# Patient Record
Sex: Male | Born: 1950
Health system: Southern US, Community
[De-identification: ages and names within clinical notes are randomized; demographics above are authoritative.]

## PROBLEM LIST (undated history)

## (undated) DIAGNOSIS — D509 Iron deficiency anemia, unspecified: Secondary | ICD-10-CM

## (undated) DIAGNOSIS — M4800 Spinal stenosis, site unspecified: Secondary | ICD-10-CM

## (undated) DIAGNOSIS — Z87891 Personal history of nicotine dependence: Secondary | ICD-10-CM

## (undated) DIAGNOSIS — E66811 Obesity, class 1: Secondary | ICD-10-CM

## (undated) DIAGNOSIS — I10 Essential (primary) hypertension: Secondary | ICD-10-CM

## (undated) DIAGNOSIS — Z951 Presence of aortocoronary bypass graft: Secondary | ICD-10-CM

## (undated) DIAGNOSIS — E559 Vitamin D deficiency, unspecified: Secondary | ICD-10-CM

## (undated) DIAGNOSIS — F339 Major depressive disorder, recurrent, unspecified: Secondary | ICD-10-CM

## (undated) DIAGNOSIS — R0609 Other forms of dyspnea: Secondary | ICD-10-CM

## (undated) DIAGNOSIS — Z8719 Personal history of other diseases of the digestive system: Secondary | ICD-10-CM

## (undated) DIAGNOSIS — F32A Depression, unspecified: Secondary | ICD-10-CM

## (undated) DIAGNOSIS — D696 Thrombocytopenia, unspecified: Secondary | ICD-10-CM

## (undated) DIAGNOSIS — G894 Chronic pain syndrome: Secondary | ICD-10-CM

## (undated) DIAGNOSIS — R1310 Dysphagia, unspecified: Secondary | ICD-10-CM

## (undated) DIAGNOSIS — E119 Type 2 diabetes mellitus without complications: Secondary | ICD-10-CM

## (undated) DIAGNOSIS — Z9861 Coronary angioplasty status: Secondary | ICD-10-CM

## (undated) DIAGNOSIS — I739 Peripheral vascular disease, unspecified: Secondary | ICD-10-CM

## (undated) DIAGNOSIS — K7460 Unspecified cirrhosis of liver: Secondary | ICD-10-CM

## (undated) DIAGNOSIS — M109 Gout, unspecified: Secondary | ICD-10-CM

## (undated) DIAGNOSIS — R161 Splenomegaly, not elsewhere classified: Secondary | ICD-10-CM

## (undated) DIAGNOSIS — R0600 Dyspnea, unspecified: Secondary | ICD-10-CM

## (undated) DIAGNOSIS — I5032 Chronic diastolic (congestive) heart failure: Secondary | ICD-10-CM

## (undated) DIAGNOSIS — G4733 Obstructive sleep apnea (adult) (pediatric): Secondary | ICD-10-CM

## (undated) DIAGNOSIS — K219 Gastro-esophageal reflux disease without esophagitis: Secondary | ICD-10-CM

## (undated) DIAGNOSIS — E785 Hyperlipidemia, unspecified: Secondary | ICD-10-CM

## (undated) DIAGNOSIS — I209 Angina pectoris, unspecified: Secondary | ICD-10-CM

## (undated) DIAGNOSIS — H9190 Unspecified hearing loss, unspecified ear: Secondary | ICD-10-CM

## (undated) DIAGNOSIS — I6523 Occlusion and stenosis of bilateral carotid arteries: Secondary | ICD-10-CM

## (undated) DIAGNOSIS — I509 Heart failure, unspecified: Secondary | ICD-10-CM

## (undated) DIAGNOSIS — I200 Unstable angina: Secondary | ICD-10-CM

## (undated) DIAGNOSIS — N400 Enlarged prostate without lower urinary tract symptoms: Secondary | ICD-10-CM

## (undated) DIAGNOSIS — E786 Lipoprotein deficiency: Secondary | ICD-10-CM

## (undated) DIAGNOSIS — F419 Anxiety disorder, unspecified: Secondary | ICD-10-CM

## (undated) DIAGNOSIS — E1122 Type 2 diabetes mellitus with diabetic chronic kidney disease: Secondary | ICD-10-CM

## (undated) DIAGNOSIS — E1151 Type 2 diabetes mellitus with diabetic peripheral angiopathy without gangrene: Secondary | ICD-10-CM

## (undated) DIAGNOSIS — N183 Chronic kidney disease, stage 3 unspecified: Secondary | ICD-10-CM

## (undated) DIAGNOSIS — E669 Obesity, unspecified: Secondary | ICD-10-CM

## (undated) DIAGNOSIS — I2510 Atherosclerotic heart disease of native coronary artery without angina pectoris: Secondary | ICD-10-CM

## (undated) DIAGNOSIS — K766 Portal hypertension: Secondary | ICD-10-CM

## (undated) DIAGNOSIS — Z794 Long term (current) use of insulin: Secondary | ICD-10-CM

## (undated) DIAGNOSIS — J449 Chronic obstructive pulmonary disease, unspecified: Secondary | ICD-10-CM

## (undated) DIAGNOSIS — K5790 Diverticulosis of intestine, part unspecified, without perforation or abscess without bleeding: Secondary | ICD-10-CM

## (undated) DIAGNOSIS — M1990 Unspecified osteoarthritis, unspecified site: Secondary | ICD-10-CM

## (undated) HISTORY — DX: Atherosclerotic heart disease of native coronary artery without angina pectoris: I25.10

## (undated) HISTORY — DX: Presence of aortocoronary bypass graft: Z95.1

## (undated) HISTORY — DX: Major depressive disorder, recurrent, unspecified: F33.9

## (undated) HISTORY — DX: Hyperlipidemia, unspecified: E78.5

## (undated) HISTORY — DX: Chronic diastolic (congestive) heart failure: I50.32

## (undated) HISTORY — DX: Type 2 diabetes mellitus with diabetic peripheral angiopathy without gangrene: E11.51

## (undated) HISTORY — DX: Obesity, class 1: E66.811

## (undated) HISTORY — DX: Dysphagia, unspecified: R13.10

## (undated) HISTORY — DX: Type 2 diabetes mellitus with diabetic chronic kidney disease: E11.22

## (undated) HISTORY — DX: Obesity, unspecified: E66.9

## (undated) HISTORY — DX: Coronary angioplasty status: Z98.61

## (undated) HISTORY — DX: Long term (current) use of insulin: Z79.4

## (undated) HISTORY — DX: Chronic kidney disease, stage 3 unspecified: N18.30

## (undated) HISTORY — DX: Essential (primary) hypertension: I10

---

## 2000-06-14 ENCOUNTER — Emergency Department (HOSPITAL_COMMUNITY): Admission: EM | Admit: 2000-06-14 | Discharge: 2000-06-14 | Payer: Self-pay | Admitting: Emergency Medicine

## 2001-09-06 ENCOUNTER — Ambulatory Visit (HOSPITAL_COMMUNITY): Admission: RE | Admit: 2001-09-06 | Discharge: 2001-09-06 | Payer: Self-pay | Admitting: Internal Medicine

## 2001-09-06 ENCOUNTER — Encounter: Payer: Self-pay | Admitting: Internal Medicine

## 2001-10-07 ENCOUNTER — Ambulatory Visit (HOSPITAL_COMMUNITY): Admission: RE | Admit: 2001-10-07 | Discharge: 2001-10-07 | Payer: Self-pay | Admitting: Cardiology

## 2005-07-18 ENCOUNTER — Emergency Department (HOSPITAL_COMMUNITY): Admission: EM | Admit: 2005-07-18 | Discharge: 2005-07-18 | Payer: Self-pay | Admitting: Emergency Medicine

## 2005-07-19 ENCOUNTER — Encounter: Admission: RE | Admit: 2005-07-19 | Discharge: 2005-07-19 | Payer: Self-pay | Admitting: Internal Medicine

## 2007-02-01 ENCOUNTER — Encounter: Admission: RE | Admit: 2007-02-01 | Discharge: 2007-02-01 | Payer: Self-pay | Admitting: Internal Medicine

## 2007-02-18 ENCOUNTER — Ambulatory Visit: Admission: RE | Admit: 2007-02-18 | Discharge: 2007-02-18 | Payer: Self-pay | Admitting: Internal Medicine

## 2007-12-06 ENCOUNTER — Encounter: Admission: RE | Admit: 2007-12-06 | Discharge: 2007-12-06 | Payer: Self-pay | Admitting: Specialist

## 2008-06-09 ENCOUNTER — Encounter: Admission: RE | Admit: 2008-06-09 | Discharge: 2008-06-09 | Payer: Self-pay | Admitting: Family Medicine

## 2010-04-17 HISTORY — PX: CERVICAL SPINE SURGERY: SHX589

## 2010-05-08 ENCOUNTER — Encounter: Payer: Self-pay | Admitting: Internal Medicine

## 2010-08-11 ENCOUNTER — Ambulatory Visit (HOSPITAL_COMMUNITY)
Admission: RE | Admit: 2010-08-11 | Discharge: 2010-08-11 | Disposition: A | Discharge status: Home / Self Care | Payer: BC Managed Care – PPO | Source: Ambulatory Visit | Attending: Neurosurgery | Admitting: Neurosurgery

## 2010-08-11 ENCOUNTER — Other Ambulatory Visit (HOSPITAL_COMMUNITY): Payer: Self-pay | Admitting: Neurosurgery

## 2010-08-11 ENCOUNTER — Encounter (HOSPITAL_COMMUNITY)
Admission: RE | Admit: 2010-08-11 | Discharge: 2010-08-11 | Disposition: A | Discharge status: Home / Self Care | Payer: BC Managed Care – PPO | Source: Ambulatory Visit | Attending: Neurosurgery | Admitting: Neurosurgery

## 2010-08-11 DIAGNOSIS — F17200 Nicotine dependence, unspecified, uncomplicated: Secondary | ICD-10-CM | POA: Insufficient documentation

## 2010-08-11 DIAGNOSIS — Z01810 Encounter for preprocedural cardiovascular examination: Secondary | ICD-10-CM | POA: Insufficient documentation

## 2010-08-11 DIAGNOSIS — Z01812 Encounter for preprocedural laboratory examination: Secondary | ICD-10-CM | POA: Insufficient documentation

## 2010-08-11 DIAGNOSIS — R05 Cough: Secondary | ICD-10-CM | POA: Insufficient documentation

## 2010-08-11 DIAGNOSIS — R059 Cough, unspecified: Secondary | ICD-10-CM | POA: Insufficient documentation

## 2010-08-11 DIAGNOSIS — M5000 Cervical disc disorder with myelopathy, unspecified cervical region: Secondary | ICD-10-CM

## 2010-08-11 DIAGNOSIS — I10 Essential (primary) hypertension: Secondary | ICD-10-CM | POA: Insufficient documentation

## 2010-08-11 DIAGNOSIS — Z01818 Encounter for other preprocedural examination: Secondary | ICD-10-CM | POA: Insufficient documentation

## 2010-08-11 DIAGNOSIS — R0602 Shortness of breath: Secondary | ICD-10-CM | POA: Insufficient documentation

## 2010-08-11 LAB — URINALYSIS, ROUTINE W REFLEX MICROSCOPIC
Bilirubin Urine: NEGATIVE
Glucose, UA: NEGATIVE mg/dL
Hgb urine dipstick: NEGATIVE
Ketones, ur: NEGATIVE mg/dL
Nitrite: NEGATIVE
Protein, ur: NEGATIVE mg/dL
Specific Gravity, Urine: 1.017 (ref 1.005–1.030)
Urobilinogen, UA: 0.2 mg/dL (ref 0.0–1.0)
pH: 5.5 (ref 5.0–8.0)

## 2010-08-11 LAB — APTT: aPTT: 28 seconds (ref 24–37)

## 2010-08-11 LAB — COMPREHENSIVE METABOLIC PANEL
ALT: 45 U/L (ref 0–53)
AST: 55 U/L — ABNORMAL HIGH (ref 0–37)
Albumin: 4 g/dL (ref 3.5–5.2)
Alkaline Phosphatase: 106 U/L (ref 39–117)
BUN: 16 mg/dL (ref 6–23)
CO2: 24 mEq/L (ref 19–32)
Calcium: 9.1 mg/dL (ref 8.4–10.5)
Chloride: 104 mEq/L (ref 96–112)
Creatinine, Ser: 1.16 mg/dL (ref 0.4–1.5)
GFR calc Af Amer: >60 mL/min (ref >60)
GFR calc non Af Amer: >60 mL/min (ref >60)
Glucose, Bld: 104 mg/dL — ABNORMAL HIGH (ref 70–99)
Potassium: 4.4 mEq/L (ref 3.5–5.1)
Sodium: 136 mEq/L (ref 135–145)
Total Bilirubin: 0.6 mg/dL (ref 0.3–1.2)
Total Protein: 6.4 g/dL (ref 6.0–8.3)

## 2010-08-11 LAB — CBC
HCT: 46.8 % (ref 39.0–52.0)
Hemoglobin: 16.1 g/dL (ref 13.0–17.0)
MCH: 31.3 pg (ref 26.0–34.0)
MCHC: 34.4 g/dL (ref 30.0–36.0)
MCV: 91.1 fL (ref 78.0–100.0)
Platelets: 238 10*3/uL (ref 150–400)
RBC: 5.14 MIL/uL (ref 4.22–5.81)
RDW: 11.8 % (ref 11.5–15.5)
WBC: 10.1 10*3/uL (ref 4.0–10.5)

## 2010-08-11 LAB — PROTIME-INR
INR: 1.04 (ref 0.00–1.49)
Prothrombin Time: 13.8 seconds (ref 11.6–15.2)

## 2010-08-11 LAB — SURGICAL PCR SCREEN
MRSA, PCR: NEGATIVE
Staphylococcus aureus: POSITIVE — AB

## 2010-08-18 ENCOUNTER — Inpatient Hospital Stay (HOSPITAL_COMMUNITY): Payer: Medicare Other

## 2010-08-18 ENCOUNTER — Observation Stay (HOSPITAL_COMMUNITY)
Admission: RE | Admit: 2010-08-18 | Discharge: 2010-08-19 | Disposition: A | Discharge status: Home / Self Care | Payer: Medicare Other | Source: Ambulatory Visit | Attending: Neurosurgery | Admitting: Neurosurgery

## 2010-08-18 DIAGNOSIS — Z01812 Encounter for preprocedural laboratory examination: Secondary | ICD-10-CM | POA: Insufficient documentation

## 2010-08-18 DIAGNOSIS — J4489 Other specified chronic obstructive pulmonary disease: Secondary | ICD-10-CM | POA: Insufficient documentation

## 2010-08-18 DIAGNOSIS — M6289 Other specified disorders of muscle: Secondary | ICD-10-CM | POA: Insufficient documentation

## 2010-08-18 DIAGNOSIS — M4712 Other spondylosis with myelopathy, cervical region: Secondary | ICD-10-CM | POA: Insufficient documentation

## 2010-08-18 DIAGNOSIS — Z01810 Encounter for preprocedural cardiovascular examination: Secondary | ICD-10-CM | POA: Insufficient documentation

## 2010-08-18 DIAGNOSIS — J449 Chronic obstructive pulmonary disease, unspecified: Secondary | ICD-10-CM | POA: Insufficient documentation

## 2010-08-18 DIAGNOSIS — M5020 Other cervical disc displacement, unspecified cervical region: Principal | ICD-10-CM | POA: Insufficient documentation

## 2010-08-23 NOTE — Op Note (Signed)
Paul [name], Paul [name] NO.:  [id number]  MEDICAL RECORD NO.:  [id number]           PATIENT TYPE:  O  LOCATION:  3533                         FACILITY:  [facility]  PHYSICIAN:  Clydene Fake, M.D.  DATE OF BIRTH:  05-01-1950  DATE OF PROCEDURE:  08/18/2010 DATE OF DISCHARGE:                              OPERATIVE REPORT   PREOPERATIVE DIAGNOSIS:  Herniated nucleus pulposus, spondylosis, stenosis with myelopathy, C4-5 and C5-6.  POSTOPERATIVE DIAGNOSIS:  Herniated nucleus pulposus, spondylosis, stenosis with myelopathy, C4-5 and C5-6.  PROCEDURE:  Anterior cervical decompression, diskectomy, and fusion at C4-5 and C5-6 with LifeNet allograft bone, Trestle anterior cervical plate.  SURGEON:  Clydene Fake, MD  ASSISTANT:  Coletta Memos, MD.  ANESTHESIA:  General endotracheal tube anesthesia.  ESTIMATED BLOOD LOSS:  Minimal.  BLOOD GIVEN:  None.  DRAINS:  None.  COMPLICATIONS:  None.  REASON FOR PROCEDURE:  The patient is a 60 year old gentleman who has had neck, shoulder, and arm pain and severe spondylitic changes in cervical spine at multiple levels, worse at the C4-5 and C5-6 levels with right femoral narrowing, stenosis and cord compression.  Exam was found to be mildly myelopathic with slight weakness, left deltoid, and the patient brought in for two-level ACF at C4-5 and C5-6.  PROCEDURE IN DETAIL:  The patient was brought to the operating room, and general anesthesia was induced.  The patient was placed in a 10-pound Halter traction, prepped and draped in the sterile fashion.  Site of incision injected with 10 mL of 1% lidocaine with epinephrine.  Incision was then made from the midline to the anterior border of the sternocleidomastoid muscle on the left side.  The neck incision was taken down to the platysma and hemostasis was obtained with Bovie cauterization.  The platysma muscle was incised with a Bovie and blunt dissection was taken  through the anterior cervical fascia to the anterior cervical spine.  Needles were placed at 2 interspaces.  X-rays were obtained showing that needles were in the C4-5 and C5-6 interspace. Disk spaces were incised and partial diskectomy was done with pituitary rongeurs and the needle was removed.  Longus colli muscle was reflected laterally with a Bovie and self-retaining retractor was placed, so we could see the C4-5 and C5-6 spaces.  Diskectomy continued with pituitary rongeurs and curettes.  Anterior osteophytes removed with Kerrison punches.  Distraction pins were placed in the C4 and C6 and the interspace was distracted.  Microscope was brought in for microdissection at this point.  Diskectomy continued at the C4-5 level with curettes and pituitary rongeurs, and then 1-2 mm Kerrison punches were used to remove posterior disk osteophytes and ligament, decompressing the central canal and performing bilateral foraminotomies, and decompressing the nerve roots.  High-speed drill was used to remove cartilaginous endplates and we measured the height of disk space to be 4 mm.  A 4-mm LifeNet allograft bone was tapped into place.  Attention was then taken to the C5-6 level.  Diskectomy done in the same manner, decompressing the central canal, bilateral foraminotomies, decompressing the nerve roots.  Cartilaginous endplate removed with  high-speed drill. The disk space was noted to be 5 mm and a 5-mm LifeNet allograft bone was tapped into place there.  Distraction pins were removed.  Hemostasis was obtained with Gelfoam and thrombin.  Weight was removed from the traction .  The bones were in good firm position.  We irrigated with antibiotic solution.  We had very good hemostasis.  Trestle anterior cervical plate was placed over the anterior cervical spine.  Two screws were placed in the C4, two in the C5, and two in the C6.  These were tightened down.  The lateral x-rays were obtained showing  good position of plate, screws, bone plug at the C4-5 and C5-6 level.  Retractors were removed.  We irrigated with antibiotic solution and we had very good hemostasis.  The platysma was closed with 3-0 Vicryl interrupted sutures.  Subcutaneous tissue closed with the same.  Skin closed with benzoin and Steri-Strips.  Dressing was placed.  The patient was placed into a soft cervical collar, awakened from anesthesia, and transferred to recovery room in stable condition.          ______________________________ Clydene Fake, M.D.     JRH/MEDQ  D:  08/18/2010  T:  08/18/2010  Job:  [id number]  Electronically Signed by Colon Branch M.D. on 08/23/2010 09:26:03 AM

## 2010-09-02 NOTE — Cardiovascular Report (Signed)
[place]  Patient:    Paul [name], Paul [name] Visit Number: [id number] MRN: [id number]          Service Type: CAT Location: [facility] 2852 [id number] Attending Physician:  Loreli Dollar Dictated by:   Julieanne Manson, M.D. Proc. Date: 10/07/01 Admit Date:  10/07/2001   CC:         Marisue Brooklyn, M.D.  Cardiac Catheterization Laboratory   Cardiac Catheterization  INDICATIONS FOR PROCEDURE: The patient is a 60 year old, cigarette smoking hypertensive male, who has complained of increasing fatigue and dyspnea on exertion. An exercise treadmill test was performed that was positive showing ST segment depression in the inferolateral leads. Because of this, he is brought in for outpatient cardiac catheterization.  DESCRIPTION OF PROCEDURE: The patient was prepped and draped in the usual sterile fashion exposing the right groin.  Following local anesthetic with 1% Xylocaine, the Seldinger technique was employed and a 5 [place] introducer sheath was placed into the right femoral artery.  Selective left and right coronary arteriography and ventriculography in the RAO projection was performed.  EQUIPMENT: The 5 French Judkins configuration catheters.  COMPLICATIONS: None.  RESULTS: 1. Hemodynamic monitoring: Central aortic pressure 127/73, left    ventricular pressure 126/17 with no significant aortic valve gradient noted    at the time of pullback. 2. Ventriculography: Ventriculography in the RAO projection using 30 cc    of contrast at 12 cc/sec. revealed good opacification of the left    ventricle. There was normal left ventricular systolic function.    Ejection fraction calculated at 64%. The left ventricular end-diastolic    pressure was 17. No mitral regurgitation was seen.  CORONARY ARTERIOGRAPHY: There was calcification in the midportion of the LAD. 1. Left main: Normal. 2. LAD: The LAD extended down to the apex of the heart. At the bifurcation    of the  LAD and the diagonal, the LAD became relatively small in diameter    going from about 2.5 mm to about 1.5 mm and stayed this same small    diameter all the way to the apex of the heart. It was diffusely small    without a focal area of narrowing. At this bifurcation of the LAD    diagonal, was an area of less than 30% narrowing. The diagonal itself    had no focal narrowing but was a small vessel also. 3. Circumflex: The circumflex was a large 3.0 to 3.25 mm vessel. It gave    rise to a very large OM vessel that was free of disease. 4. Right coronary artery: The right coronary artery was normal and had normal    PDA and a small posterolateral branch was also free of disease.  CONCLUSIONS: 1. Normal left ventricular systolic function. 2. Mild calcification in the midportion of the left anterior descending with    30% area of narrowing at the bifurcation of the left anterior descending    diagonal with the ongoing left anterior descending being diffusely small    as is the diagonal.  DISCUSSION: At this point, there is clearly no focal area of narrowing that needs intervention. Aggressive risk factor management including lipid-lowering agents, blood pressure control and cigarette cessation is important. The patient will be discharged to home later today to follow up in my office. Dictated by:   Julieanne Manson, M.D. Attending Physician:  Loreli Dollar DD:  10/07/01 TD:  10/07/01 Job: [id number]

## 2011-01-10 ENCOUNTER — Ambulatory Visit (HOSPITAL_COMMUNITY)
Admission: RE | Admit: 2011-01-10 | Discharge: 2011-01-10 | Disposition: A | Discharge status: Home / Self Care | Payer: Medicare Other | Source: Ambulatory Visit | Attending: Family Medicine | Admitting: Family Medicine

## 2011-01-10 DIAGNOSIS — M79609 Pain in unspecified limb: Secondary | ICD-10-CM | POA: Insufficient documentation

## 2011-01-10 DIAGNOSIS — R209 Unspecified disturbances of skin sensation: Secondary | ICD-10-CM | POA: Insufficient documentation

## 2011-01-10 DIAGNOSIS — R252 Cramp and spasm: Secondary | ICD-10-CM | POA: Insufficient documentation

## 2011-11-22 ENCOUNTER — Other Ambulatory Visit: Payer: Self-pay | Admitting: Cardiology

## 2011-11-22 ENCOUNTER — Encounter (HOSPITAL_COMMUNITY): Payer: Self-pay | Admitting: Pharmacy Technician

## 2011-11-23 ENCOUNTER — Encounter (HOSPITAL_COMMUNITY)
Admission: RE | Disposition: A | Discharge status: Home / Self Care | Payer: Self-pay | Source: Ambulatory Visit | Attending: Cardiology

## 2011-11-23 ENCOUNTER — Encounter (HOSPITAL_COMMUNITY): Payer: Self-pay | Admitting: General Practice

## 2011-11-23 ENCOUNTER — Ambulatory Visit (HOSPITAL_COMMUNITY)
Admission: RE | Admit: 2011-11-23 | Discharge: 2011-11-24 | Disposition: A | Discharge status: Home / Self Care | Payer: Medicare Other | Source: Ambulatory Visit | Attending: Cardiology | Admitting: Cardiology

## 2011-11-23 DIAGNOSIS — J449 Chronic obstructive pulmonary disease, unspecified: Secondary | ICD-10-CM | POA: Insufficient documentation

## 2011-11-23 DIAGNOSIS — I200 Unstable angina: Secondary | ICD-10-CM | POA: Insufficient documentation

## 2011-11-23 DIAGNOSIS — I2510 Atherosclerotic heart disease of native coronary artery without angina pectoris: Secondary | ICD-10-CM | POA: Insufficient documentation

## 2011-11-23 DIAGNOSIS — Z95820 Peripheral vascular angioplasty status with implants and grafts: Secondary | ICD-10-CM

## 2011-11-23 DIAGNOSIS — R748 Abnormal levels of other serum enzymes: Secondary | ICD-10-CM | POA: Insufficient documentation

## 2011-11-23 DIAGNOSIS — F17200 Nicotine dependence, unspecified, uncomplicated: Secondary | ICD-10-CM | POA: Insufficient documentation

## 2011-11-23 DIAGNOSIS — G4733 Obstructive sleep apnea (adult) (pediatric): Secondary | ICD-10-CM | POA: Insufficient documentation

## 2011-11-23 DIAGNOSIS — Z87891 Personal history of nicotine dependence: Secondary | ICD-10-CM | POA: Diagnosis present

## 2011-11-23 DIAGNOSIS — I10 Essential (primary) hypertension: Secondary | ICD-10-CM | POA: Diagnosis present

## 2011-11-23 DIAGNOSIS — E785 Hyperlipidemia, unspecified: Secondary | ICD-10-CM | POA: Insufficient documentation

## 2011-11-23 DIAGNOSIS — M109 Gout, unspecified: Secondary | ICD-10-CM | POA: Insufficient documentation

## 2011-11-23 DIAGNOSIS — E1169 Type 2 diabetes mellitus with other specified complication: Secondary | ICD-10-CM | POA: Diagnosis present

## 2011-11-23 HISTORY — PX: CORONARY ANGIOPLASTY WITH STENT PLACEMENT: SHX49

## 2011-11-23 HISTORY — DX: Unstable angina: I20.0

## 2011-11-23 HISTORY — DX: Gout, unspecified: M10.9

## 2011-11-23 HISTORY — DX: Other forms of dyspnea: R06.09

## 2011-11-23 HISTORY — PX: LEFT HEART CATHETERIZATION WITH CORONARY ANGIOGRAM: SHX5451

## 2011-11-23 HISTORY — DX: Chronic obstructive pulmonary disease, unspecified: J44.9

## 2011-11-23 HISTORY — DX: Obstructive sleep apnea (adult) (pediatric): G47.33

## 2011-11-23 HISTORY — DX: Dyspnea, unspecified: R06.00

## 2011-11-23 LAB — CBC
HCT: 44.9 % (ref 39.0–52.0)
Hemoglobin: 15.4 g/dL (ref 13.0–17.0)
MCH: 31.7 pg (ref 26.0–34.0)
MCHC: 34.3 g/dL (ref 30.0–36.0)
MCV: 92.4 fL (ref 78.0–100.0)
Platelets: 202 10*3/uL (ref 150–400)
RBC: 4.86 MIL/uL (ref 4.22–5.81)
RDW: 12.5 % (ref 11.5–15.5)
WBC: 10.7 10*3/uL — ABNORMAL HIGH (ref 4.0–10.5)

## 2011-11-23 LAB — BASIC METABOLIC PANEL
BUN: 10 mg/dL (ref 6–23)
CO2: 25 mEq/L (ref 19–32)
Calcium: 9.1 mg/dL (ref 8.4–10.5)
Chloride: 105 mEq/L (ref 96–112)
Creatinine, Ser: 1 mg/dL (ref 0.50–1.35)
GFR calc Af Amer: >90 mL/min (ref >90)
GFR calc non Af Amer: 79 mL/min — ABNORMAL LOW (ref >90)
Glucose, Bld: 122 mg/dL — ABNORMAL HIGH (ref 70–99)
Potassium: 5.1 mEq/L (ref 3.5–5.1)
Sodium: 139 mEq/L (ref 135–145)

## 2011-11-23 LAB — LIPID PANEL
Cholesterol: 165 mg/dL (ref 0–200)
HDL: 25 mg/dL — ABNORMAL LOW (ref >39)
LDL Cholesterol: 111 mg/dL — ABNORMAL HIGH (ref 0–99)
Total CHOL/HDL Ratio: 6.6 RATIO
Triglycerides: 145 mg/dL (ref <150)
VLDL: 29 mg/dL (ref 0–40)

## 2011-11-23 LAB — PROTIME-INR
INR: 1.1 (ref 0.00–1.49)
Prothrombin Time: 14.4 seconds (ref 11.6–15.2)

## 2011-11-23 SURGERY — LEFT HEART CATHETERIZATION WITH CORONARY ANGIOGRAM
Anesthesia: LOCAL

## 2011-11-23 MED ORDER — DEXTROSE-NACL 5-0.45 % IV SOLN
INTRAVENOUS | Status: DC
  Administered 2011-11-23: 08:00:00 via INTRAVENOUS

## 2011-11-23 MED ORDER — SODIUM CHLORIDE 0.9 % IV SOLN
0.2500 mg/kg/h | INTRAVENOUS | Status: AC
  Filled 2011-11-23: qty 250

## 2011-11-23 MED ORDER — MIDAZOLAM HCL 2 MG/2ML IJ SOLN
INTRAMUSCULAR | Status: AC
  Filled 2011-11-23: qty 2

## 2011-11-23 MED ORDER — SODIUM CHLORIDE 0.9 % IV SOLN
1.0000 mL/kg/h | INTRAVENOUS | Status: AC
  Administered 2011-11-23: 1 mL/kg/h via INTRAVENOUS

## 2011-11-23 MED ORDER — TIOTROPIUM BROMIDE MONOHYDRATE 18 MCG IN CAPS
18.0000 ug | ORAL_CAPSULE | Freq: Every day | RESPIRATORY_TRACT | Status: DC
  Administered 2011-11-24: 08:00:00 18 ug via RESPIRATORY_TRACT
  Filled 2011-11-23: qty 5

## 2011-11-23 MED ORDER — ASPIRIN 81 MG PO CHEW
324.0000 mg | CHEWABLE_TABLET | ORAL | Status: AC
  Administered 2011-11-23: 324 mg via ORAL
  Filled 2011-11-23: qty 4

## 2011-11-23 MED ORDER — SODIUM CHLORIDE 0.9 % IV SOLN
0.2500 mg/kg/h | INTRAVENOUS | Status: DC
  Administered 2011-11-23: 0.25 mg/kg/h via INTRAVENOUS
  Filled 2011-11-23: qty 250

## 2011-11-23 MED ORDER — ALBUTEROL SULFATE HFA 108 (90 BASE) MCG/ACT IN AERS
2.0000 | INHALATION_SPRAY | Freq: Four times a day (QID) | RESPIRATORY_TRACT | Status: DC | PRN
  Filled 2011-11-23: qty 6.7

## 2011-11-23 MED ORDER — FLUTICASONE-SALMETEROL 250-50 MCG/DOSE IN AEPB
1.0000 | INHALATION_SPRAY | Freq: Two times a day (BID) | RESPIRATORY_TRACT | Status: DC
  Administered 2011-11-23 – 2011-11-24 (×2): 1 via RESPIRATORY_TRACT
  Filled 2011-11-23: qty 14

## 2011-11-23 MED ORDER — NITROGLYCERIN 0.2 MG/ML ON CALL CATH LAB
INTRAVENOUS | Status: AC
  Filled 2011-11-23: qty 1

## 2011-11-23 MED ORDER — SODIUM CHLORIDE 0.9 % IJ SOLN: 3.0000 mL | INTRAMUSCULAR | Status: DC | PRN

## 2011-11-23 MED ORDER — PRASUGREL HCL 10 MG PO TABS
ORAL_TABLET | ORAL | Status: AC
  Filled 2011-11-23: qty 6

## 2011-11-23 MED ORDER — HEPARIN SODIUM (PORCINE) 1000 UNIT/ML IJ SOLN
INTRAMUSCULAR | Status: AC
  Filled 2011-11-23: qty 1

## 2011-11-23 MED ORDER — PRASUGREL HCL 10 MG PO TABS
10.0000 mg | ORAL_TABLET | Freq: Every day | ORAL | Status: DC
  Administered 2011-11-24: 10 mg via ORAL
  Filled 2011-11-23: qty 1

## 2011-11-23 MED ORDER — PANTOPRAZOLE SODIUM 40 MG PO TBEC
40.0000 mg | DELAYED_RELEASE_TABLET | Freq: Every day | ORAL | Status: DC
  Administered 2011-11-23 – 2011-11-24 (×2): 40 mg via ORAL
  Filled 2011-11-23 (×2): qty 1

## 2011-11-23 MED ORDER — METOPROLOL TARTRATE 12.5 MG HALF TABLET
12.5000 mg | ORAL_TABLET | Freq: Two times a day (BID) | ORAL | Status: DC
  Administered 2011-11-23 – 2011-11-24 (×2): 12.5 mg via ORAL
  Filled 2011-11-23 (×3): qty 1

## 2011-11-23 MED ORDER — FENTANYL CITRATE 0.05 MG/ML IJ SOLN
INTRAMUSCULAR | Status: AC
  Filled 2011-11-23: qty 2

## 2011-11-23 MED ORDER — VERAPAMIL HCL 2.5 MG/ML IV SOLN
INTRAVENOUS | Status: AC
  Filled 2011-11-23: qty 2

## 2011-11-23 MED ORDER — NITROGLYCERIN IN D5W 200-5 MCG/ML-% IV SOLN
INTRAVENOUS | Status: AC
  Filled 2011-11-23: qty 250

## 2011-11-23 MED ORDER — SODIUM CHLORIDE 0.9 % IV SOLN: 250.0000 mL | INTRAVENOUS | Status: DC | PRN

## 2011-11-23 MED ORDER — ALISKIREN FUMARATE 150 MG PO TABS
150.0000 mg | ORAL_TABLET | Freq: Every day | ORAL | Status: DC
  Administered 2011-11-23 – 2011-11-24 (×2): 150 mg via ORAL
  Filled 2011-11-23 (×3): qty 1

## 2011-11-23 MED ORDER — ZOLPIDEM TARTRATE 5 MG PO TABS
10.0000 mg | ORAL_TABLET | Freq: Every day | ORAL | Status: DC
  Administered 2011-11-23: 10 mg via ORAL
  Filled 2011-11-23: qty 2

## 2011-11-23 MED ORDER — HEPARIN (PORCINE) IN NACL 2-0.9 UNIT/ML-% IJ SOLN
INTRAMUSCULAR | Status: AC
  Filled 2011-11-23: qty 2000

## 2011-11-23 MED ORDER — ONDANSETRON HCL 4 MG/2ML IJ SOLN
4.0000 mg | Freq: Four times a day (QID) | INTRAMUSCULAR | Status: DC | PRN
  Administered 2011-11-23: 4 mg via INTRAVENOUS
  Filled 2011-11-23: qty 2

## 2011-11-23 MED ORDER — BIVALIRUDIN 250 MG IV SOLR
INTRAVENOUS | Status: AC
  Filled 2011-11-23: qty 250

## 2011-11-23 MED ORDER — HYDROMORPHONE HCL PF 1 MG/ML IJ SOLN
2.0000 mg | INTRAMUSCULAR | Status: AC
  Administered 2011-11-23: 2 mg via INTRAVENOUS
  Filled 2011-11-23: qty 2

## 2011-11-23 MED ORDER — MORPHINE SULFATE 2 MG/ML IJ SOLN
2.0000 mg | INTRAMUSCULAR | Status: DC | PRN
  Administered 2011-11-23 (×2): 2 mg via INTRAVENOUS
  Filled 2011-11-23 (×2): qty 1

## 2011-11-23 MED ORDER — DIAZEPAM 5 MG PO TABS
5.0000 mg | ORAL_TABLET | ORAL | Status: AC
  Administered 2011-11-23: 5 mg via ORAL
  Filled 2011-11-23: qty 1

## 2011-11-23 MED ORDER — LIDOCAINE HCL (PF) 1 % IJ SOLN
INTRAMUSCULAR | Status: AC
  Filled 2011-11-23: qty 30

## 2011-11-23 MED ORDER — ACETAMINOPHEN 325 MG PO TABS
650.0000 mg | ORAL_TABLET | ORAL | Status: DC | PRN
  Administered 2011-11-23: 19:00:00 650 mg via ORAL
  Filled 2011-11-23 (×2): qty 2

## 2011-11-23 MED ORDER — SODIUM CHLORIDE 0.9 % IJ SOLN: 3.0000 mL | Freq: Two times a day (BID) | INTRAMUSCULAR | Status: DC

## 2011-11-23 MED ORDER — LORAZEPAM 2 MG/ML IJ SOLN: 1.0000 mg | Freq: Once | INTRAMUSCULAR | Status: AC | PRN

## 2011-11-23 MED ORDER — ADENOSINE 12 MG/4ML IV SOLN
16.0000 mL | Freq: Once | INTRAVENOUS | Status: DC
  Filled 2011-11-23: qty 16

## 2011-11-23 MED ORDER — ASPIRIN 81 MG PO CHEW
81.0000 mg | CHEWABLE_TABLET | Freq: Every day | ORAL | Status: DC
  Administered 2011-11-24: 09:00:00 81 mg via ORAL
  Filled 2011-11-23: qty 1

## 2011-11-23 NOTE — H&P (Signed)
History and Physical Interval Note:  NAME:  Paul [name]   MRN: [id number] DOB:  October 24, 1950   ADMIT DATE: 11/23/2011   11/23/2011 8:44 AM  Tadd Holtmeyer is a 61 y.o. male with a PMH of HTN, HLD, gout, mildly elevated LFTs, presumed COPD with long term smoking and obesity with OSA who underwent cardiac cath in 2003 - noted to have minld CAD at that time.  He has had an echocardiogramin 06/2009 that demonstrated normal LV function with diastolic dysfunction adn a Myoview in 04/2011 that was read as "low risk".   He was seen @ [facility] by Corine Shelter, PA for 2 episodes of mid-sternal CP lasting ~10 seconds associated with breaking out in a sweat & feeling nauseated.  The first episode was 8/2 and the second 8/4 during which he did not break out in a sweat.  The episodes occurred while smoking a cigarette.  Additionally, he notes decreased Energy level & decreased exercise tolerance - no described as shortness of breath or chest pain.  After being evaluated by Mr. Diona Fanti & discussed with Dr. Allyson Sabal, they felt that it was appropriate to proceed with diagnostic cardiac catheterization to evaluate this Chest pain that was felt to be potentially Unstable Angina.   FAMHx: No family history on file.  SOCHx:  does not have a smoking history on file. He does not have any smokeless tobacco history on file. His alcohol and drug histories not on file.  ALLERGIES: Allergies  Allergen Reactions  . Codeine     unknown    HOME MEDICATIONS: Prescriptions prior to admission  Medication Sig Dispense Refill  . albuterol (PROVENTIL HFA;VENTOLIN HFA) 108 (90 BASE) MCG/ACT inhaler Inhale 2 puffs into the lungs every 6 (six) hours as needed. For shortness of breath      . aliskiren (TEKTURNA) 150 MG tablet Take 150 mg by mouth daily.      Marland Kitchen aspirin 325 MG tablet Take 325 mg by mouth daily.      . colchicine 0.6 MG tablet Take 0.6 mg by mouth daily.      . Esomeprazole Magnesium (NEXIUM PO) Take 1 capsule by mouth daily.        . Fluticasone-Salmeterol (ADVAIR) 250-50 MCG/DOSE AEPB Inhale 1 puff into the lungs every 12 (twelve) hours.      Marland Kitchen ibuprofen (ADVIL,MOTRIN) 200 MG tablet Take 400 mg by mouth every 6 (six) hours as needed. For pain      . tiotropium (SPIRIVA) 18 MCG inhalation capsule Place 18 mcg into inhaler and inhale daily.      Marland Kitchen zolpidem (AMBIEN) 10 MG tablet Take 10 mg by mouth at bedtime.        PHYSICAL EXAM:Blood pressure 152/95, pulse 90, temperature 97.4 F (36.3 C), resp. rate 20, height 5\' 5"  (1.651 m), weight 87.998 kg (194 lb). General appearance: alert, cooperative, appears stated age, no distress, mildly obese and pleasant, Answers ?s appropriately Neck: no adenopathy, no carotid bruit, no JVD, supple, symmetrical, trachea midline and thyroid not enlarged, symmetric, no tenderness/mass/nodules Lungs: clear to auscultation bilaterally, normal percussion bilaterally and non-labored Heart: regular rate and rhythm, S1, S2 normal, no murmur, click, rub or gallop Abdomen: soft, non-tender; bowel sounds normal; no masses,  no organomegaly and truncal obesity Extremities: extremities normal, atraumatic, no cyanosis or edema Pulses: 2+ and symmetric Neurologic: Grossly normal;CN II-XII grossly intact  IMPRESSION & PLAN The patients' history has been reviewed, patient examined, no change in status from most recent note, stable for catheterizato. I  have reviewed the patients' chart and old labs -- new labs pending. Questions were answered to the patient's satisfaction.    Kahlel Peake has presented today for surgery, with the diagnosis of chest pain The various methods of treatment have been discussed with the patient and family.   Risks / Complications include, but not limited to: Death, MI, CVA/TIA, VF/VT (with defibrillation), Bradycardia (need for temporary pacer placement), contrast induced nephropathy, bleeding / bruising / hematoma / pseudoaneurysm, vascular or coronary injury (with possible  emergent CT or Vascular Surgery), adverse medication reactions, infection.    After consideration of risks, benefits and other options for treatment, the patient has consented to Procedure(s):  LEFT HEART CATHETERIZATION AND CORONARY ANGIOGRAPHY +/- AD HOC PERCUTANEOUS CORONARY INTERVENTION  as a surgical intervention.   We will proceed with the planned procedure.   Randalyn Ahmed W [facility] [address] [place], [place]  [address]  [phone]  11/23/2011 8:44 AM

## 2011-11-23 NOTE — Brief Op Note (Signed)
11/23/2011  3:45 PM  PATIENT:  Paul [name]  61 y.o. male with a PMH of HTN, HLD, gout, mildly elevated LFTs, presumed COPD with long term smoking and obesity with OSA who underwent cardiac cath in 2003 - noted to have minld CAD at that time. He has had an echocardiogramin 06/2009 that demonstrated normal LV function with diastolic dysfunction adn a Myoview in 04/2011 that was read as "low risk".  He was seen @ [facility] by Corine Shelter, PA for 2 episodes of mid-sternal CP lasting ~10 seconds associated with breaking out in a sweat & feeling nauseated. The first episode was 8/2 and the second 8/4 during which he did not break out in a sweat. The episodes occurred while smoking a cigarette. Additionally, he notes decreased Energy level & decreased exercise tolerance - no described as shortness of breath or chest pain.  After being evaluated by Mr. Diona Fanti & discussed with Dr. Allyson Sabal, they felt that it was appropriate to proceed with diagnostic cardiac catheterization to evaluate this Chest pain that was felt to be potentially Unstable Angina.  PRE-OPERATIVE DIAGNOSIS:  Unstable Angina  POST-OPERATIVE DIAGNOSIS:   Moderate CAD in LAD, LCx & RCA with severe stenosis in the lateral branch of Diag 1  Successful PCI of Diag 1 lesion as described.  Normal / preserved EF of ~55-60% with no regional WMA    PROCEDURE:  Procedure(s) (LRB): LEFT HEART CATHETERIZATION WITH CORONARY ANGIOGRAM (N/A) FFR of the lateral branch of Diagonal 1 = 0.70 PCI of the  lateral branch of Diagonal 1 80-90% lesion reduced to 0% with Xience Expedition 2.5 mm x 33 mm - post dilated to 2.20mm  Surgeon(s) and Role:    * Marykay Lex, MD - Primary  ANESTHESIA:   local and IV sedation; 2mg  Versed, 125 mcg Fentanyl  EBL:   < 10 ml  BLOOD ADMINISTERED:none  EQUIPMENT: Diagnostic - 5 Fr R Radial Sheath - TIG 4.0 - RCA & LCA; LV Gram & Hemodynamics - Pgitail.  PCI - 6 Fr sheath, XBLAD 3.5, FFR wire = 0.70; Predil Emerge MR 2.0 x 15  mm; Xience Expedition 2.5 mm x 33 mm - deployed to 2.65 mm.   LOCAL MEDICATIONS USED:  LIDOCAINE 2 ml  TOURNIQUET: TR Band 17ml Air @ 1526; Non-occlusive hemostasis  DICTATION: .Note written in EPIC  PLAN OF CARE: Admit for overnight observation  PATIENT DISPOSITION:  PACU - hemodynamically stable.   Delay start of Pharmacological VTE agent (>24hrs) due to surgical blood loss or risk of bleeding: not applicable

## 2011-11-23 NOTE — Progress Notes (Signed)
Received report that pt had received tylenol for headache at 1845.  Pt states head no better, very irritable, states worst headache ever. IV morphine 2 mg given at 1925.  Had mild nausea with headache but nausea worsened quickly after morphine so was given iv zofran.  Ice pack to head, cool cloth to chest, tv and lights off.   Medicated with morphine again at 2027 b/c patient still in obvious pain, very irritable and becoming anxious about it.  Pupils pearl 4mm, reactive to light but pt considerably light sensitive, speech clear and appropriate, moves all extremities equally.  Zofran decreased nausea some.  Dr Royann Shivers informed, orders received. Pt vomited undigested food.  Nausea gone quickly after emesis.  Daughter at bedside, updated w/ plan of care.  Medicated with IV dilaudid, 2 mg dose divided into 2 equal doses approx 30 min apart due to previous nausea.  Pt relaxed considerably.  BP 128/70.  SR 60's.  States headache improving, now "only about an 8" on 0-10 scale.

## 2011-11-24 ENCOUNTER — Encounter (HOSPITAL_COMMUNITY): Payer: Self-pay | Admitting: Cardiology

## 2011-11-24 DIAGNOSIS — I10 Essential (primary) hypertension: Secondary | ICD-10-CM

## 2011-11-24 DIAGNOSIS — Z87891 Personal history of nicotine dependence: Secondary | ICD-10-CM | POA: Diagnosis present

## 2011-11-24 DIAGNOSIS — I200 Unstable angina: Secondary | ICD-10-CM

## 2011-11-24 DIAGNOSIS — E785 Hyperlipidemia, unspecified: Secondary | ICD-10-CM

## 2011-11-24 DIAGNOSIS — G4733 Obstructive sleep apnea (adult) (pediatric): Secondary | ICD-10-CM

## 2011-11-24 DIAGNOSIS — J449 Chronic obstructive pulmonary disease, unspecified: Secondary | ICD-10-CM | POA: Insufficient documentation

## 2011-11-24 DIAGNOSIS — I2510 Atherosclerotic heart disease of native coronary artery without angina pectoris: Secondary | ICD-10-CM

## 2011-11-24 DIAGNOSIS — E1169 Type 2 diabetes mellitus with other specified complication: Secondary | ICD-10-CM | POA: Diagnosis present

## 2011-11-24 HISTORY — DX: Unstable angina: I20.0

## 2011-11-24 HISTORY — DX: Atherosclerotic heart disease of native coronary artery without angina pectoris: I25.10

## 2011-11-24 HISTORY — DX: Obstructive sleep apnea (adult) (pediatric): G47.33

## 2011-11-24 HISTORY — DX: Essential (primary) hypertension: I10

## 2011-11-24 HISTORY — DX: Hyperlipidemia, unspecified: E78.5

## 2011-11-24 LAB — BASIC METABOLIC PANEL
BUN: 16 mg/dL (ref 6–23)
CO2: 26 mEq/L (ref 19–32)
Calcium: 8.7 mg/dL (ref 8.4–10.5)
Chloride: 106 mEq/L (ref 96–112)
Creatinine, Ser: 1.08 mg/dL (ref 0.50–1.35)
GFR calc Af Amer: 83 mL/min — ABNORMAL LOW (ref >90)
GFR calc non Af Amer: 72 mL/min — ABNORMAL LOW (ref >90)
Glucose, Bld: 120 mg/dL — ABNORMAL HIGH (ref 70–99)
Potassium: 4 mEq/L (ref 3.5–5.1)
Sodium: 140 mEq/L (ref 135–145)

## 2011-11-24 LAB — CBC
HCT: 40.6 % (ref 39.0–52.0)
Hemoglobin: 13.5 g/dL (ref 13.0–17.0)
MCH: 31.3 pg (ref 26.0–34.0)
MCHC: 33.3 g/dL (ref 30.0–36.0)
MCV: 94.2 fL (ref 78.0–100.0)
Platelets: 203 10*3/uL (ref 150–400)
RBC: 4.31 MIL/uL (ref 4.22–5.81)
RDW: 12.5 % (ref 11.5–15.5)
WBC: 10.4 10*3/uL (ref 4.0–10.5)

## 2011-11-24 MED ORDER — ATORVASTATIN CALCIUM 20 MG PO TABS
20.0000 mg | ORAL_TABLET | Freq: Every day | ORAL | Status: DC
  Filled 2011-11-24: qty 1

## 2011-11-24 MED ORDER — PRASUGREL HCL 10 MG PO TABS: 10.0000 mg | ORAL_TABLET | Freq: Every day | ORAL | Status: DC

## 2011-11-24 MED ORDER — ATORVASTATIN CALCIUM 20 MG PO TABS: 20.0000 mg | ORAL_TABLET | Freq: Every day | ORAL | Status: DC

## 2011-11-24 MED ORDER — METOPROLOL TARTRATE 25 MG PO TABS: 12.5000 mg | ORAL_TABLET | Freq: Two times a day (BID) | ORAL | Status: DC

## 2011-11-24 MED ORDER — ASPIRIN 81 MG PO CHEW: 81.0000 mg | CHEWABLE_TABLET | Freq: Every day | ORAL | Status: AC

## 2011-11-24 MED ORDER — BUTALBITAL-APAP-CAFFEINE 50-325-40 MG PO TABS
2.0000 | ORAL_TABLET | Freq: Once | ORAL | Status: AC
  Administered 2011-11-24: 10:00:00 1 via ORAL
  Filled 2011-11-24 (×2): qty 2

## 2011-11-24 MED ORDER — NITROGLYCERIN 0.4 MG SL SUBL: 0.4000 mg | SUBLINGUAL_TABLET | SUBLINGUAL | Status: DC | PRN

## 2011-11-24 MED FILL — Dextrose Inj 5%: INTRAVENOUS | Qty: 50 | Status: AC

## 2011-11-24 NOTE — Progress Notes (Signed)
Assessed for utilization review 

## 2011-11-24 NOTE — Progress Notes (Signed)
Dozed quietly after dilaudid, now awake spontaneously, states feels "so much better".  Visibly relaxed but awake with clear speech.  Asking for something to help him sleep, takes ambien at home, given po now.  Denies nausea, nibbling on crackers and soda.  VSS.

## 2011-11-24 NOTE — Progress Notes (Signed)
CARDIAC REHAB PHASE I   PRE:  Rate/Rhythm: 68 SR    BP: sitting 127/71    SaO2:   MODE:  Ambulation: 460 ft   POST:  Rate/Rhythm: 86 SR    BP: sitting 116/75     SaO2:   Pt SOB walking. Declined rest stop. Seems to be normal for him. Ed completed. Thinking about quitting smoking. Interested in being hypnotized. Requests his name be sent to [facility] although seems a little reluctant. Brother is a patient there as well. Will prob not make many diet changes. [id number]  Harriet Masson CES, ACSM

## 2011-11-24 NOTE — Progress Notes (Signed)
Subjective: Headache improved, though still present. No prior history of Headache ? Adenosine given at cath triggered H/A?  Was SOB with exertion, no chest pain  Objective: Vital signs in last 24 hours: Temp:  [97.3 F (36.3 C)-97.9 F (36.6 C)] 97.7 F (36.5 C) (08/09 0738) Pulse Rate:  [66-84] 75  (08/09 0738) Resp:  [14-20] 17  (08/09 0738) BP: (111-150)/(56-81) 116/75 mmHg (08/09 0738) SpO2:  [91 %-97 %] 94 % (08/09 0738) Weight:  [90.8 kg (200 lb 2.8 oz)] 90.8 kg (200 lb 2.8 oz) (08/09 0100) Weight change:  Last BM Date: 11/23/11 Intake/Output from previous day: +282 08/08 0701 - 08/09 0700 In: 1232 [P.O.:680; I.V.:550; IV Piggyback:2] Out: 1150 [Urine:1150] Intake/Output this shift:    PE: General:alert and oriented, MAE, follows commands Heart:S1S2 RRR Lungs:clear without wheezes Abd:+ BS, soft, non tender Ext:no edema, Rt wrist cath site without hematoma    Lab Results:  Basename 11/24/11 0650 11/23/11 0807  WBC 10.4 10.7*  HGB 13.5 15.4  HCT 40.6 44.9  PLT 203 202   BMET  Basename 11/23/11 0846  NA 139  K 5.1  CL 105  CO2 25  GLUCOSE 122*  BUN 10  CREATININE 1.00  CALCIUM 9.1   No results found for this basename: TROPONINI:2,CK,MB:2 in the last 72 hours  Lab Results  Component Value Date   CHOL 165 11/23/2011   HDL 25* 11/23/2011   LDLCALC 111* 11/23/2011   TRIG 145 11/23/2011   CHOLHDL 6.6 11/23/2011   No results found for this basename: HGBA1C     No results found for this basename: TSH    Hepatic Function Panel No results found for this basename: PROT,ALBUMIN,AST,ALT,ALKPHOS,BILITOT,BILIDIR,IBILI in the last 72 hours  Basename 11/23/11 0846  CHOL 165   No results found for this basename: PROTIME in the last 72 hours    EKG: Orders placed during the hospital encounter of 11/23/11  . EKG 12-LEAD  . EKG 12-LEAD  . EKG 12-LEAD  . EKG 12-LEAD  . EKG 12-LEAD  . EKG 12-LEAD    Studies/Results: Cardiac cath and PCI:  POST-OPERATIVE  DIAGNOSIS:  Moderate CAD in LAD, LCx & RCA with severe stenosis in the lateral branch of Diag 1  Successful PCI of Diag 1 lesion with DES Xience Expedition stent  Medications: I have reviewed the patient's current medications.    Marland Kitchen aliskiren  150 mg Oral Daily  . aspirin  81 mg Oral Daily  . bivalirudin      . fentaNYL      . fentaNYL      . Fluticasone-Salmeterol  1 puff Inhalation Q12H  . heparin      . heparin      .  HYDROmorphone (DILAUDID) injection  2 mg Intravenous NOW  . lidocaine      . metoprolol tartrate  12.5 mg Oral BID  . midazolam      . midazolam      . nitroGLYCERIN      . nitroGLYCERIN      . pantoprazole  40 mg Oral Daily  . prasugrel      . prasugrel  10 mg Oral Daily  . tiotropium  18 mcg Inhalation Daily  . verapamil      . zolpidem  10 mg Oral QHS  . DISCONTD: adenosine  16 mL Intravenous Once  . DISCONTD: sodium chloride  3 mL Intravenous Q12H   Assessment/Plan: Principal Problem:  *Unstable angina Active Problems:  CAD (coronary artery disease) in LAD,  Lcx, and RCA and severe stenosis in Diag 1 11/23/11  S/P angioplasty with stent to Diag 1 with DES Xience stent, 11/23/11  HTN (hypertension)  Hyperlipidemia  OSA (obstructive sleep apnea), uses oxygen at home did not tolerate cpap  COPD (chronic obstructive pulmonary disease)  Tobacco abuse  PLAN:  Headache improved- will give Fioricet for h/a now.  EKG without acute changes VS stable Labs pending Ambulate and d/c home if stable Cardiac rehab discussing tobacco cessation   LOS: 1 day   INGOLD,LAURA R 11/24/2011, 8:36 AM   Had a HA overnight - most likely related to NTG infusion.  Much better after Dilaudid.  No further HA.  No further CP.  I have seen & examined Mr. Vuncannon today after Ms. Ingold.  I agree with her findings, exam & recommendations. R wrist looks fine - normal reverse Allen's.  Walked in hall with [facility] - without difficulty.  Continued Smoking cessation counseling ~5 min  discussion.   Ready for d/c today - f/u with Mr. Diona Fanti / Me as OP.  DAPT (ASA, Prasugrel x at least 1 yr) CM consult to assist with medication needs.  Marykay Lex, M.D., M.S. [facility] [address] [place], [place]  [address]  [phone] Pager # [phone]  11/24/2011 10:30 AM

## 2011-11-25 NOTE — CV Procedure (Addendum)
[facility] PERCUTANEOUS CORONARY INTERVENTION REPORT  NAME:  Paul [name]   MRN: [id number] DOB:  January 15, 1951   ADMIT DATE: 11/23/2011  INTERVENTIONAL CARDIOLOGIST: Marykay Lex, M.D., MS PRIMARY CARE PROVIDER: Herb Grays, MD REFERRING CARDIOLOGIST: Mr. Corine Shelter, PA   PATIENT:  Paul [name] is a 61 y.o. male with a PMH of HTN, HLD, gout, mildly elevated LFTs, presumed COPD with long term smoking and obesity with OSA who underwent cardiac cath in 2003 - noted to have minld CAD at that time. He has had an echocardiogramin 06/2009 that demonstrated normal LV function with diastolic dysfunction adn a Myoview in 04/2011 that was read as "low risk".  He was seen @ [facility] by Corine Shelter, PA for 2 episodes of mid-sternal CP lasting ~10 seconds associated with breaking out in a sweat & feeling nauseated. The first episode was 8/2 and the second 8/4 during which he did not break out in a sweat. The episodes occurred while smoking a cigarette. Additionally, he notes decreased Energy level & decreased exercise tolerance - no described as shortness of breath or chest pain.  After being evaluated by Mr. Diona Fanti & discussed with Dr. Allyson Sabal, they felt that it was appropriate to proceed with diagnostic cardiac catheterization to evaluate this Chest pain that was felt to be potentially Unstable Angina.  PRE-OPERATIVE DIAGNOSIS:    Unstable Angina  PROCEDURES PERFORMED:    Left Heart Catheterization via 5 Fr Right Radial Artery access  Left Ventriculography, (RAO) 12 ml/sec for 25 ml total contrast  Native Coronary Angiography  IC NTG Injection.  Fractional Flow Reserve (FFR) Measurement of 70-80% lesion in Diagonal 1 extending into the major branch of this vessel;   Final Ratio: 0.70 (70 %); high-risk physiologically significant.  Percutaneous Coronary Artery Intervention on the LAD First Diagonal with a Xience Expedition DES 2.5 mm x 33 mm; final diameter 2.65  mm  PROCEDURE: Consent: The procedure with Risks/Benefits/Alternatives and Indications was reviewed with the patient and brother.  All questions were answered.   Risks / Complications include, but not limited to: Death, MI, CVA/TIA, VF/VT (with defibrillation), Bradycardia (need for temporary pacer placement), contrast induced nephropathy, bleeding / bruising / hematoma / pseudoaneurysm, vascular or coronary injury (with possible emergent CT or Vascular Surgery), adverse medication reactions, infection.    The patient and his brother voice understanding and agree to proceed.    Risks of procedure as well as the alternatives and risks of each were explained to the (patient/caregiver).  Consent for procedure obtained. Consent for signed by MD and patient with RN witness -- placed on chart.  PROCEDURE: The patient was brought to the 2nd Floor [facility] in the fasting state and prepped and draped in the usual sterile fashion for Right radial or groin access. A modified Allen's test with plethysmography was performed on the right wrist demonstrating adequate Ulnar Artery collateral flow.  Sterile technique was used including antiseptics, cap, gloves, gown, hand hygiene, mask and sheet.  Skin prep: Chlorhexidine;  Time Out: Verified patient identification, verified procedure, site/side was marked, verified correct patient position, special equipment/implants available, medications/allergies/relevent history reviewed, required imaging and test results available.  Performed  Access: Right Radial Artery;  5 Fr Sheath, Seldinger technique. Radial cocktail 10 mL administered 2  Diagnostic:  The TIG 4.0 catheter was advanced over Versicore wire into the descending aorta and used to engage both coronary arteries. Intravenous heparin was administered.  Left  and Right Coronary Artery Angiography:  TIG 4.0  LV Hemodynamics (LV Gram):  Angled Pigtail  Hemodynamics:  Central Aortic  / Mean Pressures:  112/63 mmHg;   Left Ventricular Pressures / EDP:  117/8 mmHg;  15 mmHg  Left Ventriculography:  EF:  55-60 %  Wall Motion:  no regional abnormalities  Coronary Anatomy: Left Main:  large caliber vessel bifurcates into LAD and Circumflex. Angiographically normal. LAD:  large caliber vessel that gives off a proximal diagonal branch with a roughly 30% stenosis at this takeoff which also involves is small septal perforator. There is another percent lesion at the next septal perforator before the vessel continues down around the apex. The remainder the LAD has minimal luminal irregularities.  D1:  moderate to large caliber vessel with x-rays off a small proximal branch of the bifurcates into 2 equal sized branches. At the point of the first possible branch going into the larger/main limb of the diagonal branch there is a diffuse irregular 70-80% lesion that crosses the branch point. This is followed by a roughly 50-60% lesion in the main limb before it branches. Left Circumflex:  very large caliber vessel that gives off a small marginal branch before coursing into the AV groove where there is a long tubular 40-50% lesion in it large caliber vessel that is not likely hemodynamically significant. There is appear again a small obtuse marginals before the vessel itself bifurcates into a OM system with several branches. There is diffuse 20-30% luminal irregularities distally.  RCA:  large caliber dominant vessel with a proximal 40% lesion at the first bend at the takeoff of the SA nodal artery. The vessel is roughly normal in the midportion where it gives off an RV marginal branch. It bifurcates distally into the right posterior descending artery which gives off a small branch itself is a 30% lesion.  The right posterior lateral system begins as a posterior atrioventricular groove branch gives rise to 2 major posterior lateral branches with the RPL 2 also bifurcating. Minimal luminal  irregularities distally.  After reviewing the initial cineangiography images, the culprit lesion(s) was identified, and the decision was made to proceed with FFR followed by possible percutaneous coronary intervention on the major limb of Diagonal 1.  Angiomax bolus was administered and infusion initiated -- continued for 2 hrs post PCI.  ACT > 200 sec ensured prior to advancing the FFR Wire.  Percutaneous Coronary Intervention:  Sheath exchanged for 6 Fr  Guide: 6 Fr   XB LAD 3.5  Guidewire:  [place] FFR Primewire FFR: After normalization, the FFR wire was advanced into the diagonal branch with some difficulty due to the angle takeoff of the LAD.  The position was confirmed angiographically, the adenosine infusion was administered with continuous measurement of ratios. At roughly 90 seconds the ratio dropped to 0.70 at which time adenosine infusion was discontinued and recording stopped. With a physiologically significant, high-risk FFR result the decision was made to proceed with intervention on this vessel including the downstream 50-60% lesion in the major limb.  The Primewire was used for the remainder the procedure.  Predilation Balloon:  Emerge 2.0 mm x 15 mm;  8 Atm x 30 Sec,  the balloon was then used to size the length of lesion.   Stent: Xience Expedition 2.5 mm x  33 mm;    Deployed at [employer] for [employer],   Stent balloon post-dilation at 12 Atm x 45 Sec; Final Diameter 2.65 mm. No further post-dilation was required.  Post-deployment cineangiography with and  without guidewire in place was performed in multiple orthogonal views demonstrating excellent stent deployment with slight "step-up, step-down".  There was no evidence of dissection or perforation. The catheter was removed out of the body over a J wire.  TR Band:   1523 Hours,  16 mL air  ANESTHESIA:   Local Lidocaine  2 ml SEDATION:   2 mg IV Versed,  125 mcg IV fentanyl ; Premedication:  5 mg oral Valium MEDICATIONS:  Adenosine Infusion per FFR protocol for 90 seconds.   Anticoagulation: Diagnostic  -- IV Heparin 4500 units;  PCI: Angiomax bolus and drip, drip rate decreased to 0.25 mg/KG/hour   Anti-Platelet Agent: Effient 60 mg   Contrast: Omnipaque 250 mL  Intracoronary Nitroglycerin 200 mcg x 1  EBL:   < 10 ml  PATIENT DISPOSITION:    The patient was transferred to the PACU holding area in a hemodynamicaly stable, chest pain free condition.  The patient tolerated the procedure well, and there were no complications.  The patient was stable before, during, and after the procedure.  POST-OPERATIVE DIAGNOSIS:    Severe single vessel disease involving the major diagonal branch (Diagonal 1) in its major limb. The lesions appear to be 70-80% with diffuse. Evaluated with FFR that was highly positive.  Status post successful her case coronary intervention on Diagonal 1 with a single Xience Expedition 2.5 mm x 33 mm with a final diameter of 2.65 mm.   Diffuse moderate lesion in a large Circumflex vessel that is unlikely to be hemodynamic significant as well as moderate lesion in the RCA.  Normal Left Ventricular function with normal LVEDP.  PLAN OF CARE:   Standard Post-Radial Care for overnight observation monitoring.  Dual antiplatelet therapy for minimum 1 year. Will begin with aspirin plus Effient but can be changed to Plavix if financially necessary after the first month.   Smoking Cessation Counseling  Initiate beta blocker and statin therapy to be followed up with primary cardiologist.   Anticipate discharge the morning if stable.  Rrun intravenous Nitroglycerin prophylactically for possible coronary vasospasm due to the slightly oversized stent.   Marykay Lex, M.D., M.S. [facility] [address] [place], [place]  [address]  [phone]  11/25/2011 1:38 PM

## 2011-11-27 LAB — POCT ACTIVATED CLOTTING TIME: Activated Clotting Time: 574 seconds

## 2011-12-01 NOTE — Discharge Summary (Signed)
Physician Discharge Summary  Patient ID: Abigail Marsiglia MRN: [id number] DOB/AGE: 23-Nov-1950 61 y.o.  Admit date: 11/23/2011 Discharge date: 12/01/2011  Discharge Diagnoses:  Principal Problem:  *Unstable angina Active Problems:  CAD (coronary artery disease) in LAD, Lcx, and RCA and severe stenosis in Diag 1 11/23/11  S/P angioplasty with stent to Diag 1 with DES Xience stent, 11/23/11  HTN (hypertension)  Hyperlipidemia  OSA (obstructive sleep apnea), uses oxygen at home did not tolerate cpap  COPD (chronic obstructive pulmonary disease)  Tobacco abuse   Procedures: 11/23/11: cardiac cath by Dr. Herbie Baltimore 11/23/11 Successful PCI of Diag 1 lesion as described by Dr. Herbie Baltimore   Discharged Condition: good  Hospital Course: Demyan Fugate is a 61 y.o. male with a PMH of HTN, HLD, gout, mildly elevated LFTs, presumed COPD with long term smoking and obesity with OSA who underwent cardiac cath in 2003 - noted to have minld CAD at that time. He has had an echocardiogram in 06/2009 that demonstrated normal LV function with diastolic dysfunction adn a Myoview in 04/2011 that was read as "low risk".   He was seen @ [facility] by Corine Shelter, PA for 2 episodes of mid-sternal CP lasting ~10 seconds associated with breaking out in a sweat & feeling nauseated. The first episode was 8/2 and the second 8/4 during which he did not break out in a sweat. The episodes occurred while smoking a cigarette. Additionally, he notes decreased Energy level & decreased exercise tolerance - no described as shortness of breath or chest pain.   After being evaluated by Mr. Diona Fanti & discussed with Dr. Allyson Sabal, they felt that it was appropriate to proceed with diagnostic cardiac catheterization to evaluate this Chest pain that was felt to be potentially Unstable Angina.   The patient Underwent cardiac catheterization revealing Moderate CAD in LAD, LCx & RCA with severe stenosis in the lateral branch of Diag 1 and Normal / preserved EF of ~55-60%  with no regional WMA.  He then underwent successful PCI  (With a Xience expedition stent) of diagonal 1 lesion.  Patient tolerated the procedure well and was placed in observation 6500 unit overnight for fluids and monitoring.  During the night he had significant headache with nausea and vomiting resolved with IV Dilaudid.  The next morning the headache was minimal he was able to ambulate without complications denied any chest pain. He was seen and evaluated by Dr. Herbie Baltimore was stable and ready for discharge home.  His EKG was stable.  He ambulated with cardiac rehabilitation they also reviewed educational materials of coronary artery disease and discussed tobacco cessation.   Consults: None  Significant Diagnostic Studies:  At discharge sodium 140 potassium 4.0 chloride 106 CO2 26 BUN 16 creatinine 1.08 calcium 8.7  Hemoglobin 13.5 hematocrit 40.6 previously 10.4 and platelets 203  Lipid Panel     Component Value Date/Time   CHOL 165 11/23/2011 0846   TRIG 145 11/23/2011 0846   HDL 25* 11/23/2011 0846   CHOLHDL 6.6 11/23/2011 0846   VLDL 29 11/23/2011 0846   LDLCALC 111* 11/23/2011 0846     Discharge Exam: Blood pressure 116/75, pulse 75, temperature 97.7 F (36.5 C), temperature source Oral, resp. rate 17, height 5\' 5"  (1.651 m), weight 90.8 kg (200 lb 2.8 oz), SpO2 94.00%.   PE: General:alert and oriented, MAE, follows commands  Heart:S1S2 RRR  Lungs:clear without wheezes  Abd:+ BS, soft, non tender  Ext:no edema, Rt wrist cath site without hematoma     Disposition: 01-Home or  Self Care  Discharge Orders    Future Orders Please Complete By Expires   Amb Referral to Cardiac Rehabilitation        Medication List  As of 12/01/2011 10:45 AM   STOP taking these medications         aspirin 325 MG tablet      ibuprofen 200 MG tablet         TAKE these medications         albuterol 108 (90 BASE) MCG/ACT inhaler   Commonly known as: PROVENTIL HFA;VENTOLIN HFA   Inhale 2 puffs into the  lungs every 6 (six) hours as needed. For shortness of breath      aliskiren 150 MG tablet   Commonly known as: TEKTURNA   Take 150 mg by mouth daily.      aspirin 81 MG chewable tablet   Chew 1 tablet (81 mg total) by mouth daily.      atorvastatin 20 MG tablet   Commonly known as: LIPITOR   Take 1 tablet (20 mg total) by mouth daily at 6 PM.      colchicine 0.6 MG tablet   Take 0.6 mg by mouth daily.      Fluticasone-Salmeterol 250-50 MCG/DOSE Aepb   Commonly known as: ADVAIR   Inhale 1 puff into the lungs every 12 (twelve) hours.      metoprolol tartrate 25 MG tablet   Commonly known as: LOPRESSOR   Take 0.5 tablets (12.5 mg total) by mouth 2 (two) times daily.      NEXIUM PO   Take 1 capsule by mouth daily.      nitroGLYCERIN 0.4 MG SL tablet   Commonly known as: NITROSTAT   Place 1 tablet (0.4 mg total) under the tongue every 5 (five) minutes as needed for chest pain.      prasugrel 10 MG Tabs   Commonly known as: EFFIENT   Take 1 tablet (10 mg total) by mouth daily.      tiotropium 18 MCG inhalation capsule   Commonly known as: SPIRIVA   Place 18 mcg into inhaler and inhale daily.      zolpidem 10 MG tablet   Commonly known as: AMBIEN   Take 10 mg by mouth at bedtime.           Follow-up Information    Follow up with Governor Rooks, MD on 12/12/2011. (at 2;15 pm)    Contact information:   [address]  [place] [address] [phone]         Discharge Instructions: Call [facility] if any bleeding, swelling or drainage at cath site.  May shower, no tub baths for 48 hours for groin sticks.   No lifting for for 5 days with rt. Arm  No driving for 3 days.  Heart Healthy diet  Stop smoking  Use your oxygen at night.   SignedLeone Brand 12/01/2011, 10:45 AM  I saw & examined the patient on the day of discharge.  He had a significant HA due to NTG gtt overnight. But  was doing fine at the time of discharge.  He has not had any further CP & has been ambulating without any difficulty.    He needs to stop smoking.  His wrist looks fine.  He was ready for discharge.  I agree with the d/c summary above.  Marykay Lex, M.D., M.S. [facility] [address] [place], [place]  [address]  [phone] Pager # [phone]  12/01/2011 11:44 AM

## 2012-05-02 DIAGNOSIS — F419 Anxiety disorder, unspecified: Secondary | ICD-10-CM | POA: Insufficient documentation

## 2012-05-15 ENCOUNTER — Other Ambulatory Visit (HOSPITAL_COMMUNITY): Payer: Self-pay | Admitting: Cardiovascular Disease

## 2012-05-15 DIAGNOSIS — I2510 Atherosclerotic heart disease of native coronary artery without angina pectoris: Secondary | ICD-10-CM

## 2012-05-15 DIAGNOSIS — I509 Heart failure, unspecified: Secondary | ICD-10-CM

## 2012-05-15 DIAGNOSIS — R0989 Other specified symptoms and signs involving the circulatory and respiratory systems: Secondary | ICD-10-CM

## 2012-05-15 DIAGNOSIS — R011 Cardiac murmur, unspecified: Secondary | ICD-10-CM

## 2012-06-03 ENCOUNTER — Ambulatory Visit (HOSPITAL_COMMUNITY): Payer: Medicare Other

## 2012-07-17 ENCOUNTER — Other Ambulatory Visit: Payer: Self-pay | Admitting: Neurosurgery

## 2012-07-17 DIAGNOSIS — M4802 Spinal stenosis, cervical region: Secondary | ICD-10-CM

## 2012-07-17 DIAGNOSIS — M5000 Cervical disc disorder with myelopathy, unspecified cervical region: Secondary | ICD-10-CM

## 2012-07-17 DIAGNOSIS — M4712 Other spondylosis with myelopathy, cervical region: Secondary | ICD-10-CM

## 2012-07-24 ENCOUNTER — Ambulatory Visit
Admission: RE | Admit: 2012-07-24 | Discharge: 2012-07-24 | Disposition: A | Discharge status: Home / Self Care | Payer: Medicare Other | Source: Ambulatory Visit | Attending: Neurosurgery | Admitting: Neurosurgery

## 2012-07-24 DIAGNOSIS — M5000 Cervical disc disorder with myelopathy, unspecified cervical region: Secondary | ICD-10-CM

## 2012-07-24 DIAGNOSIS — M4802 Spinal stenosis, cervical region: Secondary | ICD-10-CM

## 2012-07-24 DIAGNOSIS — M4712 Other spondylosis with myelopathy, cervical region: Secondary | ICD-10-CM

## 2012-12-10 ENCOUNTER — Encounter: Payer: Self-pay | Admitting: Cardiology

## 2012-12-10 ENCOUNTER — Ambulatory Visit (INDEPENDENT_AMBULATORY_CARE_PROVIDER_SITE_OTHER): Payer: Medicare Other | Admitting: Cardiology

## 2012-12-10 VITALS — BP 136/92 | HR 76 | Ht 66.0 in | Wt 207.5 lb

## 2012-12-10 DIAGNOSIS — E669 Obesity, unspecified: Secondary | ICD-10-CM

## 2012-12-10 DIAGNOSIS — Z9861 Coronary angioplasty status: Secondary | ICD-10-CM

## 2012-12-10 DIAGNOSIS — E785 Hyperlipidemia, unspecified: Secondary | ICD-10-CM

## 2012-12-10 DIAGNOSIS — Z9889 Other specified postprocedural states: Secondary | ICD-10-CM

## 2012-12-10 DIAGNOSIS — I10 Essential (primary) hypertension: Secondary | ICD-10-CM

## 2012-12-10 DIAGNOSIS — Z87891 Personal history of nicotine dependence: Secondary | ICD-10-CM

## 2012-12-10 DIAGNOSIS — I2510 Atherosclerotic heart disease of native coronary artery without angina pectoris: Secondary | ICD-10-CM

## 2012-12-10 NOTE — Patient Instructions (Signed)
Hold  Atorvastatin (LIPITOR) FOR 1 MONTH to see if your discomfort goes away. Call back to let us know if symptoms better.  Have Dr Yehuda Budd send a copy of labs.   Your physician wants you to follow-up in 12 month Dr Herbie Baltimore.  You will receive a reminder letter in the mail two months in advance. If you don't receive a letter, please call our office to schedule the follow-up appointment.

## 2012-12-11 ENCOUNTER — Encounter: Payer: Self-pay | Admitting: Cardiology

## 2012-12-30 ENCOUNTER — Encounter: Payer: Self-pay | Admitting: Cardiology

## 2012-12-30 DIAGNOSIS — E669 Obesity, unspecified: Secondary | ICD-10-CM | POA: Insufficient documentation

## 2012-12-30 DIAGNOSIS — E66811 Obesity, class 1: Secondary | ICD-10-CM | POA: Insufficient documentation

## 2012-12-30 NOTE — Assessment & Plan Note (Addendum)
No active anginal symptoms. He does have dyspnea on exertion, nothing like the symptoms he had prior to his PCI. I think his dyspnea clearly is multifactorial is Dr. Alanda Amass felt. I do think it reassessment with a cardiac pulmonary exercise tests will be warranted. I plan to do this when I see him back in followup. He only had moderate lesions in the other vessels, and without active angina, I don't see any reason to perform a nuclear stress test at this time.  Plan: Continue with dual antiplatelet therapy (aspirin not listening her medications but he is taking). If Effient becomes difficult to financial standpoint, it would be happy to switch him to Plavix as he is now past one year standpoint. Continue beta blocker at current dose. He seems to be tolerating it okay from a respiratory standpoint. He is on TEKTURNA which should give [place] the ACE inhibitor/ARB-type protection. He is also on a decent is a statin which I will actually have him hold to see if this helps his cramps. I would want to switch him to a different statin to slow disease progression.  Assess exertional dyspnea with cardiopulmonary exercise stress test following followup visit in one year.Marland Kitchen

## 2012-12-30 NOTE — Assessment & Plan Note (Signed)
I congratulated him on his smoking cessation. He does note that his breathing is better since that and use morning cough is significantly improved. I encouraged him to continue to abstain from cigarettes.

## 2012-12-30 NOTE — Assessment & Plan Note (Signed)
Weight loss will be a difficult thing for him, with his exertional dyspnea, it did remind him that it's still important for her to get out and try to start of exercise even if it means walking for short period of time and then catching his breath. We also discussed some dietary modifications such as reducing starchy foods and fatty panel products. Recommended using more vegetables and fruits and whole gr as well as some reasonable helpings of lean meats. If he continues to have difficulty with weight loss, I would consider referral to a dietitian.

## 2012-12-30 NOTE — Assessment & Plan Note (Signed)
Borderline control on current regimen. I think he may benefit from an ACE inhibitor or ARB, but he came to [place] on TEKTURNA. I think is more blood pressure control is required, Micronase she would be to use chlorthalidone as a long-acting antihypertensive diuretic with better data for potential diastolic dysfunction. For now I think is fine on current medications. I will defer to his primary physician to make any adjustments as needed

## 2012-12-30 NOTE — Assessment & Plan Note (Signed)
He is currently on Lipitor 20 mg daily, and will return as cramping. Getting some concern that this could be a medication interaction. Have asked him to hold his dose for one month to see if his symptoms are improved. If so I would like to switch him to a different statin as opposed to decreasing the current dose. I think we can switch him to either Crestor or drug statin depending on his insurance coverage.

## 2012-12-30 NOTE — Progress Notes (Signed)
Paul [name]  MRN: [id number] DOB 01/20/1951  PCP: Herb Grays, MD  Clinic Note: Chief Complaint  Patient presents with  . Annual Exam    no chest pain ,no sob-heavy breathing, no edema,no enregy   HPI: Paul [name] is a 62 y.o. male with a PMH below who presents today for six-month followup, last seen in January 2014. I met him for the first time in August of last year where I did a redo cardiac catheterization on him. We found a 78% lesion in the first diagonal branch with a significant FFR of 0.7. This was treated with a 2.5 mm 35 mm Xience DES stent. He last saw Dr. Alanda Amass January 28 and noted exertional dyspnea. But had confirmed that he stop smoking since August 2013. Dr. Alanda Amass felt that his exercise intolerance is probably due to combined deconditioning and COPD from his chronic cigarette use. He was supposed to get a CPET-MET-TEST performed as well as an echocardiogram to assess him for cardiopulmonary standpoint. Unfortunately these were not done. He was also supposed to have Carotid Dopplers checked as well.  Interval History: He presents today for routine followup without any major complaints. He said that he was unable to tolerate the CPAP at night so is now using home oxygen at night. He still gets exertional dyspnea, but it is better since he quit smoking. Is not having morning coughs as much. He said his nose he really does not a lot of energy as he drags on on. He still "keeps on going. " He denies any exertional angina or rest angina symptoms like he had before. No resting shortness of breath. He says he notes "fleeting episodes of discomfort in his chest but nothing lasting more than a minute or 2 and she did have a sharp twinging pain.  The remainder of Cardiovascular ROS is as follows: negative for - chest pain, edema, irregular heartbeat, loss of consciousness, murmur, orthopnea, palpitations, paroxysmal nocturnal dyspnea, rapid heart rate or shortness of breath at  rest. Additional cardiac review of systems: Lightheadedness - no, dizziness - no, syncope/near-syncope - no; TIA/amaurosis fugax - no Melena - no, hematochezia no; obesity hematuria - no; nosebleeds - no; claudication - no  Past Medical History  Diagnosis Date  . History of Unstable angina 11/24/2011    Referred for cardiac catheterization  . CAD S/P percutaneous coronary angioplasty -- D1, Xience Xpedition DES 2.5 mm x 33 mm 11/24/2011    Mild-to-moderate 30-40% lesions in the RCA, LAD and circumflex. Culprit lesion was a long tubular 70-80% lesion in D1 with FFR of 0.7. --> PCI with a DES  . S/P angioplasty with stent to Diag 1 with DES Xience stent 11/24/2011    Xience Xpedition 2.5 mm x 30 mm  . HTN (hypertension) 11/24/2011  . Hyperlipidemia 11/24/2011  . OSA (obstructive sleep apnea), uses oxygen at home did not tolerate cpap 11/24/2011  . COPD (chronic obstructive pulmonary disease)     "don't have full case of it; I'm right there at it"  . Exertional dyspnea, chronic   . Gout   . Obesity (BMI 30.0-34.9)     Prior Cardiac Evaluation and Past Surgical History: Past Surgical History  Procedure Laterality Date  . Coronary angioplasty with stent placement  11/23/2011    "1; first one"  . Cervical spine surgery  2012   Allergies  Allergen Reactions  . Codeine Other (See Comments)    U"think it made me sick"    Current Outpatient Prescriptions  Medication Sig Dispense Refill  . albuterol (PROVENTIL HFA;VENTOLIN HFA) 108 (90 BASE) MCG/ACT inhaler Inhale 2 puffs into the lungs every 6 (six) hours as needed. For shortness of breath      . aliskiren (TEKTURNA) 150 MG tablet Take 150 mg by mouth as needed.       Marland Kitchen atorvastatin (LIPITOR) 20 MG tablet Take 1 tablet (20 mg total) by mouth daily at 6 PM.  30 tablet  11  . colchicine 0.6 MG tablet Take 0.6 mg by mouth daily.      . Esomeprazole Magnesium (NEXIUM PO) Take 1 capsule by mouth daily.      . Fluticasone-Salmeterol (ADVAIR) 250-50  MCG/DOSE AEPB Inhale 1 puff into the lungs every 12 (twelve) hours. prn      . meloxicam (MOBIC) 7.5 MG tablet Take 7.5 mg by mouth daily.       . metoprolol tartrate (LOPRESSOR) 25 MG tablet Take 25 mg by mouth daily.      . nitroGLYCERIN (NITROSTAT) 0.4 MG SL tablet Place 1 tablet (0.4 mg total) under the tongue every 5 (five) minutes as needed for chest pain.  25 tablet  4  . prasugrel (EFFIENT) 10 MG TABS Take 1 tablet (10 mg total) by mouth daily.  30 tablet  5  . tiotropium (SPIRIVA) 18 MCG inhalation capsule Place 18 mcg into inhaler and inhale as needed.       . zolpidem (AMBIEN) 10 MG tablet Take 10 mg by mouth at bedtime.       No current facility-administered medications for this visit.    History   Social History Narrative   He is a 62 y.o. divorced father of 3, grandfather 1.   He is a retired Equities trader, former Nutritional therapist. 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   He quit smoking in August 2013, after smoking a pack a day for roughly 40 years.   He drinks socially 2-3 drinks a week only.   He does not get routine exercise, mostly due to 2 fatigue and dyspnea. Otherwise been relatively sedentary.    ROS: A comprehensive Review of Systems - General ROS: positive for  - fatigue and malaise Psychological ROS: She stresses out about his son, and they are often arguing.Marland Kitchen Respiratory ROS: positive for - cough and Exertional shortness of breath, but improving negative for - hemoptysis, orthopnea, sputum changes or wheezing Gastrointestinal ROS: no abdominal pain, change in bowel habits, or black or bloody stools positive for - constipation Musculoskeletal ROS: Upper and lower extremity cramping at night.  PHYSICAL EXAM BP 136/92  Pulse 76  Ht 5\' 6"  (1.676 m)  Wt 207 lb 8 oz (94.121 kg)  BMI 33.51 kg/m2 General appearance: alert, cooperative, appears stated age, no distress, mildly  obese, moderately obese and (Mesomorphic body habitus).  Otherwise well-groomed, answers questions appropriately. Neck: no adenopathy, no JVD, supple, symmetrical, trachea midline and No HJR. Soft bilateral carotid bruits with normal brisk upstroke Lungs: clear to auscultation bilaterally, normal percussion bilaterally and Increased AP diameter but no active wheezing, rales or rhonchi. Heart: regular rate and rhythm, S1, S2 normal, S4 present, 1/6 systolic murmur heard best along the Left sternal border, no click and no rub Abdomen: soft, non-tender; bowel sounds normal; no masses,  no organomegaly and Obese Extremities: extremities normal, atraumatic, no cyanosis or edema, no edema, redness or tenderness in the calves or thighs and no ulcers, gangrene or trophic  changes Pulses: 2+ and symmetric Neurologic: Grossly normal HEENT: [place]/AT, EOMI, MMM, anicteric sclera  [id number] today: Yes Rate: 76 , Rhythm: Normal Sinus Rhythm; borderline rightward axis  Recent Labs: None recently checked.  ASSESSMENT / PLAN: CAD S/P percutaneous coronary angioplasty -- D1, Xience Xpedition DES 2.5 mm x 33 mm No active anginal symptoms. He does have dyspnea on exertion, nothing like the symptoms he had prior to his PCI. I think his dyspnea clearly is multifactorial is Dr. Alanda Amass felt. I do think it reassessment with a cardiac pulmonary exercise tests will be warranted. I plan to do this when I see him back in followup. He only had moderate lesions in the other vessels, and without active angina, I don't see any reason to perform a nuclear stress test at this time.  Plan: Continue with dual antiplatelet therapy (aspirin not listening her medications but he is taking). If Effient becomes difficult to financial standpoint, it would be happy to switch him to Plavix as he is now past one year standpoint. Continue beta blocker at current dose. He seems to be tolerating it okay from a respiratory standpoint. He is on  TEKTURNA which should give [place] the ACE inhibitor/ARB-type protection. He is also on a decent is a statin which I will actually have him hold to see if this helps his cramps. I would want to switch him to a different statin to slow disease progression.  Assess exertional dyspnea with cardiopulmonary exercise stress test following followup visit in one year.Marland Kitchen  HTN (hypertension) Borderline control on current regimen. I think he may benefit from an ACE inhibitor or ARB, but he came to [place] on TEKTURNA. I think is more blood pressure control is required, Micronase she would be to use chlorthalidone as a long-acting antihypertensive diuretic with better data for potential diastolic dysfunction. For now I think is fine on current medications. I will defer to his primary physician to make any adjustments as needed  Hyperlipidemia He is currently on Lipitor 20 mg daily, and will return as cramping. Getting some concern that this could be a medication interaction. Have asked him to hold his dose for one month to see if his symptoms are improved. If so I would like to switch him to a different statin as opposed to decreasing the current dose. I think we can switch him to either Crestor or drug statin depending on his insurance coverage.  Former heavy cigarette smoker (20-39 per day) - quit August 2013 I congratulated him on his smoking cessation. He does note that his breathing is better since that and use morning cough is significantly improved. I encouraged him to continue to abstain from cigarettes.  Obesity (BMI 30.0-34.9) Weight loss will be a difficult thing for him, with his exertional dyspnea, it did remind him that it's still important for her to get out and try to start of exercise even if it means walking for short period of time and then catching his breath. We also discussed some dietary modifications such as reducing starchy foods and fatty panel products. Recommended using more vegetables and fruits  and whole gr as well as some reasonable helpings of lean meats. If he continues to have difficulty with weight loss, I would consider referral to a dietitian.    Orders Placed This Encounter  Procedures  . EKG 12-Lead   Followup: One year, or sooner if symptoms worsen.  DAVID W. Herbie Baltimore, M.D., M.S. [facility] [address] [place],  [place]  [address]  [phone] Pager # [phone]

## 2013-03-10 DIAGNOSIS — F339 Major depressive disorder, recurrent, unspecified: Secondary | ICD-10-CM | POA: Insufficient documentation

## 2013-03-10 DIAGNOSIS — M5030 Other cervical disc degeneration, unspecified cervical region: Secondary | ICD-10-CM | POA: Insufficient documentation

## 2013-03-10 DIAGNOSIS — F329 Major depressive disorder, single episode, unspecified: Secondary | ICD-10-CM | POA: Insufficient documentation

## 2013-05-06 DIAGNOSIS — M542 Cervicalgia: Secondary | ICD-10-CM | POA: Diagnosis not present

## 2013-05-06 DIAGNOSIS — G8929 Other chronic pain: Secondary | ICD-10-CM | POA: Diagnosis not present

## 2013-05-06 DIAGNOSIS — F3289 Other specified depressive episodes: Secondary | ICD-10-CM | POA: Diagnosis not present

## 2013-05-06 DIAGNOSIS — J449 Chronic obstructive pulmonary disease, unspecified: Secondary | ICD-10-CM | POA: Diagnosis not present

## 2013-05-06 DIAGNOSIS — M159 Polyosteoarthritis, unspecified: Secondary | ICD-10-CM | POA: Diagnosis not present

## 2013-05-06 DIAGNOSIS — M5030 Other cervical disc degeneration, unspecified cervical region: Secondary | ICD-10-CM | POA: Diagnosis not present

## 2013-05-06 DIAGNOSIS — F329 Major depressive disorder, single episode, unspecified: Secondary | ICD-10-CM | POA: Diagnosis not present

## 2013-05-06 DIAGNOSIS — IMO0002 Reserved for concepts with insufficient information to code with codable children: Secondary | ICD-10-CM | POA: Diagnosis not present

## 2013-07-31 DIAGNOSIS — M542 Cervicalgia: Secondary | ICD-10-CM | POA: Diagnosis not present

## 2013-07-31 DIAGNOSIS — IMO0002 Reserved for concepts with insufficient information to code with codable children: Secondary | ICD-10-CM | POA: Diagnosis not present

## 2013-07-31 DIAGNOSIS — M159 Polyosteoarthritis, unspecified: Secondary | ICD-10-CM | POA: Diagnosis not present

## 2013-07-31 DIAGNOSIS — G8929 Other chronic pain: Secondary | ICD-10-CM | POA: Diagnosis not present

## 2013-07-31 DIAGNOSIS — M5030 Other cervical disc degeneration, unspecified cervical region: Secondary | ICD-10-CM | POA: Diagnosis not present

## 2013-07-31 DIAGNOSIS — G4700 Insomnia, unspecified: Secondary | ICD-10-CM | POA: Diagnosis not present

## 2013-08-15 DIAGNOSIS — E1169 Type 2 diabetes mellitus with other specified complication: Secondary | ICD-10-CM | POA: Diagnosis not present

## 2013-08-15 DIAGNOSIS — J309 Allergic rhinitis, unspecified: Secondary | ICD-10-CM | POA: Diagnosis not present

## 2013-08-15 DIAGNOSIS — M79609 Pain in unspecified limb: Secondary | ICD-10-CM | POA: Diagnosis not present

## 2013-08-15 DIAGNOSIS — R209 Unspecified disturbances of skin sensation: Secondary | ICD-10-CM | POA: Diagnosis not present

## 2013-08-15 DIAGNOSIS — K219 Gastro-esophageal reflux disease without esophagitis: Secondary | ICD-10-CM | POA: Diagnosis not present

## 2013-08-15 DIAGNOSIS — E119 Type 2 diabetes mellitus without complications: Secondary | ICD-10-CM | POA: Diagnosis not present

## 2013-08-15 DIAGNOSIS — J45909 Unspecified asthma, uncomplicated: Secondary | ICD-10-CM | POA: Diagnosis not present

## 2013-08-15 DIAGNOSIS — E785 Hyperlipidemia, unspecified: Secondary | ICD-10-CM | POA: Diagnosis not present

## 2013-08-15 DIAGNOSIS — I10 Essential (primary) hypertension: Secondary | ICD-10-CM | POA: Diagnosis not present

## 2013-08-15 DIAGNOSIS — R5381 Other malaise: Secondary | ICD-10-CM | POA: Diagnosis not present

## 2013-09-10 DIAGNOSIS — E785 Hyperlipidemia, unspecified: Secondary | ICD-10-CM | POA: Diagnosis not present

## 2013-09-10 DIAGNOSIS — J309 Allergic rhinitis, unspecified: Secondary | ICD-10-CM | POA: Diagnosis not present

## 2013-09-10 DIAGNOSIS — I10 Essential (primary) hypertension: Secondary | ICD-10-CM | POA: Diagnosis not present

## 2013-11-03 DIAGNOSIS — M5030 Other cervical disc degeneration, unspecified cervical region: Secondary | ICD-10-CM | POA: Diagnosis not present

## 2013-11-05 DIAGNOSIS — R5383 Other fatigue: Secondary | ICD-10-CM | POA: Diagnosis not present

## 2013-11-05 DIAGNOSIS — R5381 Other malaise: Secondary | ICD-10-CM | POA: Diagnosis not present

## 2013-11-05 DIAGNOSIS — I10 Essential (primary) hypertension: Secondary | ICD-10-CM | POA: Diagnosis not present

## 2013-11-05 DIAGNOSIS — E119 Type 2 diabetes mellitus without complications: Secondary | ICD-10-CM | POA: Diagnosis not present

## 2013-11-05 DIAGNOSIS — K219 Gastro-esophageal reflux disease without esophagitis: Secondary | ICD-10-CM | POA: Diagnosis not present

## 2013-11-05 DIAGNOSIS — E785 Hyperlipidemia, unspecified: Secondary | ICD-10-CM | POA: Diagnosis not present

## 2013-11-05 DIAGNOSIS — M6281 Muscle weakness (generalized): Secondary | ICD-10-CM | POA: Diagnosis not present

## 2013-11-05 DIAGNOSIS — R209 Unspecified disturbances of skin sensation: Secondary | ICD-10-CM | POA: Diagnosis not present

## 2013-11-05 DIAGNOSIS — M79609 Pain in unspecified limb: Secondary | ICD-10-CM | POA: Diagnosis not present

## 2013-11-17 DIAGNOSIS — R209 Unspecified disturbances of skin sensation: Secondary | ICD-10-CM | POA: Diagnosis not present

## 2013-11-17 DIAGNOSIS — I498 Other specified cardiac arrhythmias: Secondary | ICD-10-CM | POA: Diagnosis not present

## 2013-11-17 DIAGNOSIS — E119 Type 2 diabetes mellitus without complications: Secondary | ICD-10-CM | POA: Diagnosis not present

## 2013-11-17 DIAGNOSIS — I10 Essential (primary) hypertension: Secondary | ICD-10-CM | POA: Diagnosis not present

## 2013-11-17 DIAGNOSIS — M79609 Pain in unspecified limb: Secondary | ICD-10-CM | POA: Diagnosis not present

## 2013-11-17 DIAGNOSIS — K219 Gastro-esophageal reflux disease without esophagitis: Secondary | ICD-10-CM | POA: Diagnosis not present

## 2013-11-17 DIAGNOSIS — R5383 Other fatigue: Secondary | ICD-10-CM | POA: Diagnosis not present

## 2013-11-17 DIAGNOSIS — R0602 Shortness of breath: Secondary | ICD-10-CM | POA: Diagnosis not present

## 2013-11-17 DIAGNOSIS — E785 Hyperlipidemia, unspecified: Secondary | ICD-10-CM | POA: Diagnosis not present

## 2013-11-17 DIAGNOSIS — R5381 Other malaise: Secondary | ICD-10-CM | POA: Diagnosis not present

## 2013-11-25 ENCOUNTER — Encounter: Payer: Self-pay | Admitting: Cardiology

## 2013-11-25 ENCOUNTER — Ambulatory Visit (INDEPENDENT_AMBULATORY_CARE_PROVIDER_SITE_OTHER): Payer: Medicare Other | Admitting: Cardiology

## 2013-11-25 VITALS — BP 150/90 | HR 54 | Ht 65.0 in | Wt 199.6 lb

## 2013-11-25 DIAGNOSIS — I10 Essential (primary) hypertension: Secondary | ICD-10-CM

## 2013-11-25 DIAGNOSIS — I2510 Atherosclerotic heart disease of native coronary artery without angina pectoris: Secondary | ICD-10-CM | POA: Diagnosis not present

## 2013-11-25 DIAGNOSIS — R0609 Other forms of dyspnea: Secondary | ICD-10-CM

## 2013-11-25 DIAGNOSIS — R0989 Other specified symptoms and signs involving the circulatory and respiratory systems: Secondary | ICD-10-CM

## 2013-11-25 DIAGNOSIS — Z9861 Coronary angioplasty status: Secondary | ICD-10-CM | POA: Diagnosis not present

## 2013-11-25 DIAGNOSIS — R0600 Dyspnea, unspecified: Secondary | ICD-10-CM

## 2013-11-25 NOTE — Progress Notes (Signed)
HPI The patient has known CAD and Multiple ongoing cardiovascular risk factors. I was able to see some notes recently from a primary provider at [facility]. She was seeing him for this diabetes. He was short of breath and was referred back to cardiology and so he was added to my schedule today. He is seen by Dr. Ellyn Hack but he has not seen him since last year. At that time there was a mention of dyspnea and it was felt he should have a cardiopulmonary stress test.  He does have followup with Dr. Ellyn Hack in October.  He does have a history of coronary disease with a drug-eluting stent to a diagonal in August of 2013. He had nonobstructive disease elsewhere. Of note, he was having increased fatigue and was given some testosterone by the provider at [facility].    He reports multiple complaints. He says he doesn't feel good in general. He's been having chest discomfort for a couple of weeks. He describes it as a right sided pounding discomfort. Some of this is fleeting. He has some shoulder discomfort. He doesn't think is like his previous pain but he doesn't remember this discomfort. He thinks it is slowly getting worse. He has fatigue. He has some shortness of breath which seems to be chronic. He did start smoking cigarettes again however. He's not describing classic PND or orthopnea. He's not describing classic substernal chest pressure. He's had no presyncope or syncope. He  He is not clear on any of the meds he takes.  Of note we were able to look at labs drawn at [facility].  LDL was 73, THS OK in July and A1C 6.1.  He was not anemic and renal function was OK.    Allergies  Allergen Reactions  . Codeine Other (See Comments)    U"think it made me sick"    Current Outpatient Prescriptions  Medication Sig Dispense Refill  . albuterol (PROVENTIL HFA;VENTOLIN HFA) 108 (90 BASE) MCG/ACT inhaler Inhale 2 puffs into the lungs every 6 (six) hours as needed. For shortness of breath      . aliskiren (TEKTURNA) 150  MG tablet Take 150 mg by mouth as needed.       Marland Kitchen atorvastatin (LIPITOR) 20 MG tablet Take 1 tablet (20 mg total) by mouth daily at 6 PM.  30 tablet  11  . colchicine 0.6 MG tablet Take 0.6 mg by mouth daily.      . Esomeprazole Magnesium (NEXIUM PO) Take 1 capsule by mouth daily.      . Fluticasone-Salmeterol (ADVAIR) 250-50 MCG/DOSE AEPB Inhale 1 puff into the lungs every 12 (twelve) hours. prn      . metoprolol tartrate (LOPRESSOR) 25 MG tablet Take 25 mg by mouth daily.      . nitroGLYCERIN (NITROSTAT) 0.4 MG SL tablet Place 1 tablet (0.4 mg total) under the tongue every 5 (five) minutes as needed for chest pain.  25 tablet  4  . prasugrel (EFFIENT) 10 MG TABS Take 1 tablet (10 mg total) by mouth daily.  30 tablet  5  . tiotropium (SPIRIVA) 18 MCG inhalation capsule Place 18 mcg into inhaler and inhale as needed.       . zolpidem (AMBIEN) 10 MG tablet Take 10 mg by mouth at bedtime.       No current facility-administered medications for this visit.    Past Medical History  Diagnosis Date  . History of Unstable angina 11/24/2011    Referred for cardiac catheterization  .  CAD S/P percutaneous coronary angioplasty -- D1, Xience Xpedition DES 2.5 mm x 33 mm 11/24/2011    Mild-to-moderate 30-40% lesions in the RCA, LAD and circumflex. Culprit lesion was a long tubular 70-80% lesion in D1 with FFR of 0.7. --> PCI with a DES  . S/P angioplasty with stent to Diag 1 with DES Xience stent 11/24/2011    Xience Xpedition 2.5 mm x 30 mm  . HTN (hypertension) 11/24/2011  . Hyperlipidemia 11/24/2011  . OSA (obstructive sleep apnea), uses oxygen at home did not tolerate cpap 11/24/2011  . COPD (chronic obstructive pulmonary disease)     "don't have full case of it; I'm right there at it"  . Exertional dyspnea, chronic   . Gout   . Obesity (BMI 30.0-34.9)     Past Surgical History  Procedure Laterality Date  . Coronary angioplasty with stent placement  11/23/2011    "1; first one"  . Cervical spine surgery   2012    ROS:  As stated in the HPI and negative for all other systems.  PHYSICAL EXAM BP 150/90  Pulse 54  Ht 5\' 5"  (1.651 m)  Wt 199 lb 9.6 oz (90.538 kg)  BMI 33.22 kg/m2 GENERAL:  Well appearing HEENT:  Pupils equal round and reactive, fundi not visualized, oral mucosa unremarkable NECK:  No jugular venous distention, waveform within normal limits, carotid upstroke brisk and symmetric, no bruits, no thyromegaly LYMPHATICS:  No cervical, inguinal adenopathy LUNGS:  Clear to auscultation bilaterally BACK:  No CVA tenderness CHEST:  Unremarkable HEART:  PMI not displaced or sustained,S1 and S2 within normal limits, no S3, no S4, no clicks, no rubs, no murmurs ABD:  Flat, positive bowel sounds normal in frequency in pitch, no bruits, no rebound, no guarding, no midline pulsatile mass, no hepatomegaly, no splenomegaly EXT:  2 plus pulses throughout, no edema, no cyanosis no clubbing SKIN:  No rashes no nodules NEURO:  Cranial nerves II through XII grossly intact, motor grossly intact throughout PSYCH:  Cognitively intact, oriented to person place and time   EKG:  Sinus rhythm, rate 54, axis within normal limits, intervals within normal limits, baseline artifact precludes adequate analysis but there does not appear to be acute ischemic ST-T wave changes.  11/25/2013   ASSESSMENT AND PLAN  DYSPNEA:   I suspect that this is multifactorial and does seem to be chronic. He will be evaluated as below with a stress test and I will also check a BNP level. I will then defer to Dr. Ellyn Hack as to whether he wants to followup with a cardiopulmonary stress testing if these tests are unrevealing.  HTN:  Unfortunately I can adjust any of his medications as an not sure what is taking and I see. When he comes back and bring his medicines with him.  CAD:  Given the chest pain this will be evaluated with a stress test. When you walk on a treadmill. Therefore, he'll have a Lexiscan  Myoview.  DYSLIPIDEMIA:  He needs to bring his medications back again to clarify which lipid medicines he's on.  SLEEP APNEA:  He is encouraged to wear hisCPAP routinely.  TOBACCO:    He is encouraged to stop smoking completely.

## 2013-11-25 NOTE — Patient Instructions (Signed)
Your physician recommends that you schedule a follow-up appointment in: one year unless we see something that needs to be followed up on in your testing

## 2013-11-26 ENCOUNTER — Encounter: Payer: Self-pay | Admitting: Cardiology

## 2013-11-26 LAB — BRAIN NATRIURETIC PEPTIDE: Brain Natriuretic Peptide: 19.6 pg/mL (ref 0.0–100.0)

## 2013-11-27 NOTE — Progress Notes (Signed)
Pt. Informed of his labs

## 2013-12-02 ENCOUNTER — Telehealth (HOSPITAL_COMMUNITY): Payer: Self-pay

## 2013-12-02 NOTE — Telephone Encounter (Signed)
Encounter complete. 

## 2013-12-03 ENCOUNTER — Ambulatory Visit (HOSPITAL_COMMUNITY)
Admission: RE | Admit: 2013-12-03 | Discharge: 2013-12-03 | Disposition: A | Discharge status: Home / Self Care | Payer: Medicare Other | Source: Ambulatory Visit | Attending: Cardiology | Admitting: Cardiology

## 2013-12-03 DIAGNOSIS — I2510 Atherosclerotic heart disease of native coronary artery without angina pectoris: Secondary | ICD-10-CM

## 2013-12-03 DIAGNOSIS — I10 Essential (primary) hypertension: Secondary | ICD-10-CM

## 2013-12-03 DIAGNOSIS — Z9861 Coronary angioplasty status: Principal | ICD-10-CM

## 2013-12-04 ENCOUNTER — Ambulatory Visit (HOSPITAL_COMMUNITY)
Admission: RE | Admit: 2013-12-04 | Discharge: 2013-12-04 | Disposition: A | Discharge status: Home / Self Care | Payer: Medicare Other | Source: Ambulatory Visit | Attending: Cardiology | Admitting: Cardiology

## 2013-12-04 VITALS — Ht 65.0 in | Wt 199.0 lb

## 2013-12-04 DIAGNOSIS — I10 Essential (primary) hypertension: Secondary | ICD-10-CM | POA: Insufficient documentation

## 2013-12-04 DIAGNOSIS — R0602 Shortness of breath: Secondary | ICD-10-CM | POA: Diagnosis not present

## 2013-12-04 DIAGNOSIS — E669 Obesity, unspecified: Secondary | ICD-10-CM | POA: Insufficient documentation

## 2013-12-04 DIAGNOSIS — R42 Dizziness and giddiness: Secondary | ICD-10-CM | POA: Insufficient documentation

## 2013-12-04 DIAGNOSIS — Z8249 Family history of ischemic heart disease and other diseases of the circulatory system: Secondary | ICD-10-CM | POA: Insufficient documentation

## 2013-12-04 DIAGNOSIS — R079 Chest pain, unspecified: Secondary | ICD-10-CM | POA: Diagnosis not present

## 2013-12-04 DIAGNOSIS — R5383 Other fatigue: Secondary | ICD-10-CM

## 2013-12-04 DIAGNOSIS — Z87891 Personal history of nicotine dependence: Secondary | ICD-10-CM | POA: Diagnosis not present

## 2013-12-04 DIAGNOSIS — Z951 Presence of aortocoronary bypass graft: Secondary | ICD-10-CM | POA: Diagnosis not present

## 2013-12-04 DIAGNOSIS — I2510 Atherosclerotic heart disease of native coronary artery without angina pectoris: Secondary | ICD-10-CM | POA: Diagnosis not present

## 2013-12-04 DIAGNOSIS — R5381 Other malaise: Secondary | ICD-10-CM | POA: Insufficient documentation

## 2013-12-04 DIAGNOSIS — Z9861 Coronary angioplasty status: Secondary | ICD-10-CM | POA: Diagnosis not present

## 2013-12-04 MED ORDER — TECHNETIUM TC 99M SESTAMIBI GENERIC - CARDIOLITE
30.7000 | Freq: Once | INTRAVENOUS | Status: AC | PRN
  Administered 2013-12-04: 30.7 via INTRAVENOUS

## 2013-12-04 MED ORDER — AMINOPHYLLINE 25 MG/ML IV SOLN
75.0000 mg | Freq: Once | INTRAVENOUS | Status: AC
  Administered 2013-12-04: 75 mg via INTRAVENOUS

## 2013-12-04 MED ORDER — TECHNETIUM TC 99M SESTAMIBI GENERIC - CARDIOLITE
10.5000 | Freq: Once | INTRAVENOUS | Status: AC | PRN
  Administered 2013-12-04: 11 via INTRAVENOUS

## 2013-12-04 MED ORDER — REGADENOSON 0.4 MG/5ML IV SOLN
0.4000 mg | Freq: Once | INTRAVENOUS | Status: AC
  Administered 2013-12-04: 0.4 mg via INTRAVENOUS

## 2013-12-04 NOTE — Procedures (Addendum)
[facility] [address] [place] [address] [phone]  Cardiology Nuclear Med Study  Paul [name] is a 63 y.o. male     MRN : [id number]     DOB: 08/31/1950  Procedure Date: 12/04/2013  Nuclear Med Background Indication for Stress Test:  Evaluation for Ischemia and Stent Patency History:  COPD and CAD;STENT/PTCA-11/2011;Unstable angina;No prior NUC MPI for comparison;ECHO on 05/15/2012 Cardiac Risk Factors: Family History - CAD, History of Smoking, Hypertension, Lipids and Obesity  Symptoms:  Chest Pain, Fatigue, Light-Headedness and SOB   Nuclear Pre-Procedure Caffeine/Decaff Intake:  7:00pm NPO After: 5:00am   IV Site: R Forearm  IV 0.9% NS with Angio Cath:  22g  Chest Size (in):  46"  IV Started by: Rolene Course, RN  Height: 5\' 5"  (1.651 m)  Cup Size: n/a  BMI:  Body mass index is 33.12 kg/(m^2). Weight:  199 lb (90.266 kg)   Tech Comments:  n/a    Nuclear Med Study 1 or 2 day study: 1 day  Stress Test Type:  [place]ard Provider:  Minus Breeding, MD   Resting Radionuclide: Technetium 1m Sestamibi  Resting Radionuclide Dose: 10.5 mCi   Stress Radionuclide:  Technetium 49m Sestamibi  Stress Radionuclide Dose: 30.7 mCi           Stress Protocol Rest HR: 62 Stress HR: 92  Rest BP: 169/94 Stress BP: 169/88  Exercise Time (min): n/a METS: n/a   Predicted Max HR: 156 bpm % Max HR: 66.67 bpm Rate Pressure Product: 18824  Dose of Adenosine (mg):  n/a Dose of Lexiscan: 0.4 mg  Dose of Atropine (mg): n/a Dose of Dobutamine: n/a mcg/kg/min (at max HR)  Stress Test Technologist: Leane Para, CCT Nuclear Technologist: Imagene Riches, CNMT   Rest Procedure:  Myocardial perfusion imaging was performed at rest 45 minutes following the intravenous administration of Technetium 39m Sestamibi. Stress Procedure:  The patient received IV Lexiscan 0.4 mg over 15-seconds.  Technetium 54m Sestamibi  injected IV at 30-seconds.  Patient experienced SOB and 75 mg of Aminophylline was administered.  There were no significant changes with Lexiscan.  Quantitative spect images were obtained after a 45 minute delay.  Transient Ischemic Dilatation (Normal <1.22):  1.04  QGS EDV:  85 ml QGS ESV:  32 ml LV Ejection Fraction: 63%  Rest ECG: NSR - Normal EKG  Stress ECG: No significant change from baseline ECG  QPS Raw Data Images:  Normal; no motion artifact; normal heart/lung ratio. Stress Images:  Inferoseptal perfusion defect Rest Images:  Inferoseptal perfusion defect Subtraction (SDS):  No evidence of ischemia.  Impression Exercise Capacity:  Lexiscan with no exercise. BP Response:  Normal blood pressure response. Clinical Symptoms:  No significant symptoms noted. ECG Impression:  No significant ECG changes with Lexiscan. Comparison with Prior Nuclear Study: No previous nuclear study performed  Overall Impression:  Low risk stress nuclear study with mostly fixed basal inferoseptal attenuation artifact. No reversible ischemia.  LV Wall Motion:  NL LV Function; NL Wall Motion; EF 63%  Pixie Casino, MD, [facility] Board Certified in Nuclear Cardiology Attending Cardiologist [place], MD  12/04/2013 12:44 PM

## 2013-12-04 NOTE — Progress Notes (Signed)
Quick Note:  Stress Test looked OK!! No sign of significant Heart Artery Disease - there was a "defect" but it looks more like an artifact than a prior heart attack (probably because the area in question moves & scar does not). Pump function is normal. No sign of "ISCHEMIA" (not enough blood flow) during stress.   Good news!!, Does not explain Shortness of Breath.  Leonie Man, MD  ______

## 2013-12-08 ENCOUNTER — Telehealth: Payer: Self-pay | Admitting: *Deleted

## 2013-12-08 NOTE — Telephone Encounter (Signed)
Spoke to patient. Result given . Verbalized understanding  

## 2013-12-08 NOTE — Telephone Encounter (Signed)
Message copied by Raiford Simmonds on Mon Dec 08, 2013 10:01 AM ------      Message from: Leonie Man      Created: Thu Dec 04, 2013  9:46 PM       Stress Test looked OK!! No sign of significant Heart Artery Disease - there was a "defect" but it looks more like an artifact than a prior heart attack (probably because the area in question moves & scar does not).  Pump function is normal.      No sign of "ISCHEMIA" (not enough blood flow) during stress.              Good news!!, Does not explain Shortness of Breath.            Leonie Man, MD       ------

## 2013-12-10 ENCOUNTER — Telehealth: Payer: Self-pay | Admitting: Cardiology

## 2013-12-10 NOTE — Telephone Encounter (Signed)
Pt been off of his Androgel,wants to know if is all right for him to start back taking it?

## 2013-12-10 NOTE — Telephone Encounter (Signed)
Androgel is okay. There is a link between taking testosterone and progressive coronary disease. My recommendations would be low dose.   Leonie Man, MD

## 2013-12-10 NOTE — Telephone Encounter (Signed)
Message forwarded to Dr. Ellyn Hack to advise.

## 2013-12-11 NOTE — Telephone Encounter (Signed)
Information given to patient. Patient verbalized understanding.

## 2013-12-11 NOTE — Telephone Encounter (Signed)
Left message to call back  

## 2013-12-11 NOTE — Telephone Encounter (Signed)
Left message for patient to return call.

## 2013-12-16 ENCOUNTER — Telehealth: Payer: Self-pay | Admitting: Cardiology

## 2013-12-16 NOTE — Telephone Encounter (Signed)
Letter composed from information Dr. Ellyn Hack provided in triage message on 12/10/13 - Androgel OK, low dose recommended. Letter faxed to number provided below.

## 2013-12-16 NOTE — Telephone Encounter (Signed)
Brooke(nurse) called in stating that the Doctor wanted to start him on testosterone and she needs a note from his cardiologist stating that he approves of this. [phone] is the faax number. Please call Thanks

## 2014-01-19 ENCOUNTER — Ambulatory Visit (INDEPENDENT_AMBULATORY_CARE_PROVIDER_SITE_OTHER): Payer: Medicare Other | Admitting: Cardiology

## 2014-01-19 ENCOUNTER — Encounter: Payer: Self-pay | Admitting: Cardiology

## 2014-01-19 VITALS — BP 134/76 | HR 50 | Ht 65.0 in | Wt 199.7 lb

## 2014-01-19 DIAGNOSIS — E66811 Obesity, class 1: Secondary | ICD-10-CM

## 2014-01-19 DIAGNOSIS — I10 Essential (primary) hypertension: Secondary | ICD-10-CM

## 2014-01-19 DIAGNOSIS — G4733 Obstructive sleep apnea (adult) (pediatric): Secondary | ICD-10-CM

## 2014-01-19 DIAGNOSIS — R0600 Dyspnea, unspecified: Secondary | ICD-10-CM

## 2014-01-19 DIAGNOSIS — R0609 Other forms of dyspnea: Secondary | ICD-10-CM | POA: Diagnosis not present

## 2014-01-19 DIAGNOSIS — E669 Obesity, unspecified: Secondary | ICD-10-CM

## 2014-01-19 DIAGNOSIS — I2510 Atherosclerotic heart disease of native coronary artery without angina pectoris: Secondary | ICD-10-CM | POA: Diagnosis not present

## 2014-01-19 DIAGNOSIS — J449 Chronic obstructive pulmonary disease, unspecified: Secondary | ICD-10-CM

## 2014-01-19 DIAGNOSIS — Z79899 Other long term (current) drug therapy: Secondary | ICD-10-CM

## 2014-01-19 DIAGNOSIS — Z9861 Coronary angioplasty status: Secondary | ICD-10-CM

## 2014-01-19 DIAGNOSIS — E785 Hyperlipidemia, unspecified: Secondary | ICD-10-CM

## 2014-01-19 MED ORDER — CLOPIDOGREL BISULFATE 75 MG PO TABS: 75.0000 mg | ORAL_TABLET | Freq: Every day | ORAL | Status: DC

## 2014-01-19 MED ORDER — ATORVASTATIN CALCIUM 20 MG PO TABS: 20.0000 mg | ORAL_TABLET | Freq: Every day | ORAL | Status: DC

## 2014-01-19 NOTE — Progress Notes (Signed)
PCP: Florina Ou, MD  Clinic Note: Chief Complaint  Patient presents with  . 6 MONTHS VISIT    NO CHEST PAIN ,  SOB WITH ACTIVITY, -WHEEZING,  EDEMA, FEET PAIN ,GOUT, WAKE UP - STUMBLE EVERY MORNING. NOT SURE WHY HE IS NOT TAKINFG EFFIENT. ?IF CARDIAC REHAB MAY BE OPTION - DOES NOT FEEL LIKE DOING ANYTHING   HPI: Paul [name] is a 63 y.o. male with a PMH below who presents today for followup after visit with Dr. Minus Breeding on 11/25/2013. He was referred back to cardiology for shortness of breath by his PCP.  He has a history of CAD status post PCI to D1.  I had last seen him in September of 2014 and ordered a cardiopulmonary stress test to evaluate exertional dyspnea. Unfortunately, this was never done. When he saw Dr. Percival Spanish, he was reporting multiple complaints, one of which was chest discomfort that was going on for several weeks. He described as a right-sided pounding spells that were fleeting but also associated with shoulder discomfort. He also noted chronic exertional dyspnea. He was evaluated with a BNP level and Nuclear Stress Test (LexiScan Myoview) that was without any significant infarct or ischemia. There was a suggestion of an inferior filling defect that was more consistent with artifact/attenuation than true infarct as there was normal wall motion. EF was 63%. BNP was normal at 19.6.  Past Medical History  Diagnosis Date  . History of Unstable angina 11/24/2011    Referred for cardiac catheterization  . CAD S/P percutaneous coronary angioplasty -- D1, Xience Xpedition DES 2.5 mm x 33 mm 11/24/2011    a) Mild-to-moderate 30-40% lesions in the RCA, LAD and Circumflex. b) CULPRIT LESION: long tubular 70-80% lesion in D1 with FFR of 0.7 --> PCI w/ Xience Xpedition DES 2.5 mm x 30 mm (2.65 MM); c) Lexiscan Myoview 11/2013: No Ischemia or Infarct (Inferior Gut Attenuation) EF 63%.  . Essential hypertension 11/24/2011  . Hyperlipidemia with target LDL less than 70 11/24/2011  . OSA  (obstructive sleep apnea), uses oxygen at home did not tolerate cpap 11/24/2011  . COPD (chronic obstructive pulmonary disease)     "don't have full case of it; I'm right there at it"  . Exertional dyspnea, chronic   . Gout   . Obesity (BMI 30.0-34.9)    Interval History: Since his visit with Dr. Percival Spanish, he seems to be doing relatively well. He still gets short of breath with doing moderate to significant activity. Is not having any chest tightness or pressure with rest or exertion. He is deathly cut back on his smoking but still smokes about 4 or 5 cigarettes a day. He denies any PND, orthopnea but does have some mild lower extremity edema.  He does note mild fatigue with poor energy level. He feels that he is unable to do much throughout the course of the day. He feels he does not have any "get up and go".  He denies any rapid or irregular heartbeat/palpitations. No syncope or near-syncope, TIA/amaurosis fugax symptoms. Somewhere along the line he is taken off aspirin and Plavix. He is also currently not taking Lipitor either.  ROS: A comprehensive was performed. Review of Systems  Constitutional: Positive for malaise/fatigue. Negative for fever and chills.  HENT: Positive for congestion. Negative for nosebleeds.        Occasional headaches usually associated with sinus congestion  Respiratory: Positive for cough and shortness of breath. Negative for hemoptysis, sputum production and wheezing.  Chronic cough and exertional dyspnea  Cardiovascular: Negative for claudication.       Otherwise per history of present illness  Gastrointestinal: Negative for constipation, blood in stool and melena.  Genitourinary: Negative for hematuria.  Musculoskeletal: Positive for joint pain.       Arthralgia pain  Neurological: Positive for dizziness and headaches. Negative for sensory change, speech change, focal weakness, seizures and loss of consciousness.       Mild positional dizziness    Endo/Heme/Allergies: Does not bruise/bleed easily.  Psychiatric/Behavioral: Negative for depression. The patient is not nervous/anxious.   All other systems reviewed and are negative.   Current Outpatient Prescriptions on File Prior to Visit  Medication Sig Dispense Refill  . albuterol (PROVENTIL HFA;VENTOLIN HFA) 108 (90 BASE) MCG/ACT inhaler Inhale 2 puffs into the lungs every 6 (six) hours as needed. For shortness of breath      . aliskiren (TEKTURNA) 150 MG tablet Take 150 mg by mouth as needed.       . colchicine 0.6 MG tablet Take 0.6 mg by mouth daily.      . Esomeprazole Magnesium (NEXIUM PO) Take 1 capsule by mouth daily.      . Fluticasone-Salmeterol (ADVAIR) 250-50 MCG/DOSE AEPB Inhale 1 puff into the lungs every 12 (twelve) hours. prn      . nitroGLYCERIN (NITROSTAT) 0.4 MG SL tablet Place 1 tablet (0.4 mg total) under the tongue every 5 (five) minutes as needed for chest pain.  25 tablet  4  . tiotropium (SPIRIVA) 18 MCG inhalation capsule Place 18 mcg into inhaler and inhale as needed.       . zolpidem (AMBIEN) 10 MG tablet Take 10 mg by mouth at bedtime.      Marland Kitchen atorvastatin (LIPITOR) 20 MG tablet Take 1 tablet (20 mg total) by mouth daily at 6 PM.  30 tablet  11   No current facility-administered medications on file prior to visit.    ALLERGIES REVIEWED IN EPIC -- no change SOCIAL AND FAMILY HISTORY REVIEWED IN EPIC -- no change  Wt Readings from Last 3 Encounters:  01/19/14 199 lb 11.2 oz (90.583 kg)  12/04/13 199 lb (90.266 kg)  11/25/13 199 lb 9.6 oz (90.538 kg)    PHYSICAL EXAM BP 134/76  Pulse 50  Ht 5\' 5"  (1.651 m)  Wt 199 lb 11.2 oz (90.583 kg)  BMI 33.23 kg/m2 General appearance: alert, cooperative, appears stated age, no distress, mildly obese, moderately obese and (Mesomorphic body habitus). Otherwise well-groomed, answers questions appropriately.  Neck: no adenopathy, no JVD, supple, symmetrical, trachea midline and No HJR. Soft bilateral carotid bruits  with normal brisk upstroke  Lungs: CTA B., normal percussion bilaterally and Increased AP diameter but no active wheezing, rales or rhonchi.  Heart: RRR,, S1, S2 normal, S4 present, 1/6 systolic murmur heard best along the Left sternal border, no click and no rub  Abdomen: soft, non-tender; bowel sounds normal; no masses, no organomegaly and Obese  Extremities: extremities normal, atraumatic, no cyanosis or edema, no edema, redness or tenderness in the calves or thighs and no ulcers, gangrene or trophic changes  Pulses: 2+ and symmetric  Neurologic: Grossly normal  HEENT: [place]/AT, EOMI, MMM, anicteric sclera   Adult ECG Report  Rate: 50 ;  Rhythm: sinus bradycardia  Narrative Interpretation: Otherwise normal EKG  Recent Labs:   From PCP in [place]  TC 101, TG 176, HDL 24, LDL 52  ASSESSMENT / PLAN: 63 year old gentleman with known coronary disease status post PTCI to  diagonal recently evaluated with a nonischemic nuclear stress test who continues to have poor energy levels and exertional dyspnea.   DOE (dyspnea on exertion) Exertional dyspnea associated with fatigue. He does have some COPD history as well as obesity. HEENT normal EF on nuclear stress test with no ischemia. Clearly there could be some diastolic dysfunction contributing some to his dyspnea, but this does explain his poor energy level.  He is bradycardic on beta blocker.  Plan:   Check 2-D echocardiogram to evaluate possible to diastolic dysfunction and confirmed no evidence of inferior hypokinesis  DC beta blocker -- he will need to wean off his current dose by cutting in half for one week and then stopping.  CAD S/P percutaneous coronary angioplasty -- D1, Xience Xpedition DES 2.5 mm x 33 mm He said that he was grabbing does not seem consistent with angina, this is confirmed with a relatively normal Myoview stress test. I am stopping his beta blocker for reasons noted above. I will restart a statin as well as Plavix.  He had been on Effient and was supposed to start Plavix but never did. He is on Tekturna as opposed to ACE inhibitor/ARB.  Essential hypertension Relatively well-controlled on current regimen. He may need additional control after stopping the beta blocker. If this were the case I would consider adding HCTZ or chlorthalidone as he is having some mild lower extremity edema. We can reassess this on followup.  Hyperlipidemia with target LDL less than 70  His last lipid panel looked pretty good with exception of low HDL. We had recommended holding his statin for a month to see if that helped his cramping symptoms in the past. I will restart him on Lipitor 20 mg, and the cramps recur I will switch him to either Pravachol or Crestor.  Obesity (BMI 30.0-34.9) Is a difficult task for him as far as losing weight. His is a student decreased exercise level, is more prone to put on weight or maintain his current weight. Simply explained and the need to continue to walk on the is taking short breaks while walking. He also needs to pay close attention to to dietary modification with reducing his overall caloric intake.  COPD (chronic obstructive pulmonary disease) He has is listed as a diagnosis, and clearly based on his long-standing smoking history he probably has some COPD. He is on Advair and albuterol as well as Spiriva which would suggest that he probably has documented data to support that diagnosis. This is managed by his PCP.    Orders Placed This Encounter  Procedures  . Lipid panel    Standing Status: Future     Number of Occurrences:      Standing Expiration Date: 01/20/2015    Order Specific Question:  Has the patient fasted?    Answer:  Yes  . Comprehensive metabolic panel    Standing Status: Future     Number of Occurrences:      Standing Expiration Date: 01/20/2015    Order Specific Question:  Has the patient fasted?    Answer:  Yes  . EKG 12-Lead  . 2D Echocardiogram without contrast      Order Specific Question:  Type of Echo    Answer:  Complete    Order Specific Question:  Where should this test be performed    Answer:  [facility]    Order Specific Question:  Reason for exam-Echo    Answer:  Dyspnea  786.09 / R06.00   Meds  ordered this encounter  Medications  . omeprazole (PRILOSEC) 20 MG capsule    Sig: Take 20 mg by mouth daily.  Marland Kitchen oxyCODONE-acetaminophen (PERCOCET/ROXICET) 5-325 MG per tablet    Sig: Take 1 tablet by mouth every 8 (eight) hours as needed for severe pain.   . ANDROGEL PUMP 20.25 MG/ACT (1.62%) GEL    Sig:   . clopidogrel (PLAVIX) 75 MG tablet    Sig: Take 1 tablet (75 mg total) by mouth daily.    Dispense:  90 tablet    Refill:  3  . atorvastatin (LIPITOR) 20 MG tablet    Sig: Take 1 tablet (20 mg total) by mouth daily.    Dispense:  90 tablet    Refill:  3   Followup:  4- 6 months   HARDING,DAVID W, M.D., M.S. Interventional Cardiologist   Pager # [phone]

## 2014-01-19 NOTE — Patient Instructions (Addendum)
Your physician has requested that you have an echocardiogram. Echocardiography is a painless test that uses sound waves to create images of your heart. It provides your doctor with information about the size and shape of your heart and how well your heart's chambers and valves are working. This procedure takes approximately one hour. There are no restrictions for this procedure.  LABS IN 5 MONTHS -LIPID,CMP  START PLAVIX (CLOPIDOGREL 75 MG  ) DAILY. START LIPITOR 20 MG AT BEDTIME  WEAN OFF METOPROLOL TAKE 1/2 TABLET TWICE A DAY FOR 2 WEEKS THEN STOP.  Your physician wants you to follow-up in [place].  You will receive a reminder letter in the mail two months in advance. If you don't receive a letter, please call our office to schedule the follow-up appointment.

## 2014-01-20 ENCOUNTER — Encounter: Payer: Self-pay | Admitting: Cardiology

## 2014-01-20 DIAGNOSIS — R0609 Other forms of dyspnea: Principal | ICD-10-CM

## 2014-01-20 DIAGNOSIS — R0600 Dyspnea, unspecified: Secondary | ICD-10-CM | POA: Insufficient documentation

## 2014-01-20 NOTE — Assessment & Plan Note (Addendum)
Exertional dyspnea associated with fatigue. He does have some COPD history as well as obesity. HEENT normal EF on nuclear stress test with no ischemia. Clearly there could be some diastolic dysfunction contributing some to his dyspnea, but this does explain his poor energy level.  He is bradycardic on beta blocker.  Plan:   Check 2-D echocardiogram to evaluate possible to diastolic dysfunction and confirmed no evidence of inferior hypokinesis  DC beta blocker -- he will need to wean off his current dose by cutting in half for one week and then stopping.

## 2014-01-20 NOTE — Assessment & Plan Note (Signed)
His last lipid panel looked pretty good with exception of low HDL. We had recommended holding his statin for a month to see if that helped his cramping symptoms in the past. I will restart him on Lipitor 20 mg, and the cramps recur I will switch him to either Pravachol or Crestor.

## 2014-01-20 NOTE — Assessment & Plan Note (Signed)
He has is listed as a diagnosis, and clearly based on his long-standing smoking history he probably has some COPD. He is on Advair and albuterol as well as Spiriva which would suggest that he probably has documented data to support that diagnosis. This is managed by his PCP.

## 2014-01-20 NOTE — Assessment & Plan Note (Addendum)
>>  ASSESSMENT AND PLAN FOR OSA AND COPD OVERLAP SYNDROME (HCC) WRITTEN ON 04/23/2022  9:44 PM BY Doneen Ollinger E, NP  >>ASSESSMENT AND PLAN FOR CHRONIC OBSTRUCTIVE PULMONARY DISEASE, UNSPECIFIED (HCC) WRITTEN ON 01/20/2014  7:58 AM BY HARDING, DAVID W, MD  He has is listed as a diagnosis, and clearly based on his long-standing smoking history he probably has some COPD. He is on Advair and albuterol as well as Spiriva which would suggest that he probably has documented data to support that diagnosis. This is managed by his PCP.  >>ASSESSMENT AND PLAN FOR DOE (DYSPNEA ON EXERTION) WRITTEN ON 01/20/2014  7:52 AM BY HARDING, DAVID W, MD  Exertional dyspnea associated with fatigue. He does have some COPD history as well as obesity. HEENT normal EF on nuclear stress test with no ischemia. Clearly there could be some diastolic dysfunction contributing some to his dyspnea, but this does explain his poor energy level.  He is bradycardic on beta blocker.  Plan:  Check 2-D echocardiogram to evaluate possible to diastolic dysfunction and confirmed no evidence of inferior hypokinesis DC beta blocker -- he will need to wean off his current dose by cutting in half for one week and then stopping.

## 2014-01-20 NOTE — Assessment & Plan Note (Signed)
Is a difficult task for him as far as losing weight. His is a student decreased exercise level, is more prone to put on weight or maintain his current weight. Simply explained and the need to continue to walk on the is taking short breaks while walking. He also needs to pay close attention to to dietary modification with reducing his overall caloric intake.

## 2014-01-20 NOTE — Assessment & Plan Note (Signed)
He said that he was grabbing does not seem consistent with angina, this is confirmed with a relatively normal Myoview stress test. I am stopping his beta blocker for reasons noted above. I will restart a statin as well as Plavix. He had been on Effient and was supposed to start Plavix but never did. He is on Tekturna as opposed to ACE inhibitor/ARB.

## 2014-01-20 NOTE — Assessment & Plan Note (Signed)
Relatively well-controlled on current regimen. He may need additional control after stopping the beta blocker. If this were the case I would consider adding HCTZ or chlorthalidone as he is having some mild lower extremity edema. We can reassess this on followup.

## 2014-01-22 ENCOUNTER — Other Ambulatory Visit (HOSPITAL_COMMUNITY): Payer: Self-pay | Admitting: Cardiology

## 2014-01-22 ENCOUNTER — Ambulatory Visit (HOSPITAL_COMMUNITY)
Admission: RE | Admit: 2014-01-22 | Discharge: 2014-01-22 | Disposition: A | Discharge status: Home / Self Care | Payer: Medicare Other | Source: Ambulatory Visit | Attending: Cardiology | Admitting: Cardiology

## 2014-01-22 DIAGNOSIS — E785 Hyperlipidemia, unspecified: Secondary | ICD-10-CM | POA: Insufficient documentation

## 2014-01-22 DIAGNOSIS — R0602 Shortness of breath: Secondary | ICD-10-CM

## 2014-01-22 DIAGNOSIS — R0600 Dyspnea, unspecified: Secondary | ICD-10-CM

## 2014-01-22 DIAGNOSIS — Z9861 Coronary angioplasty status: Secondary | ICD-10-CM

## 2014-01-22 DIAGNOSIS — I10 Essential (primary) hypertension: Secondary | ICD-10-CM | POA: Insufficient documentation

## 2014-01-22 DIAGNOSIS — R0609 Other forms of dyspnea: Secondary | ICD-10-CM | POA: Diagnosis not present

## 2014-01-22 DIAGNOSIS — I2510 Atherosclerotic heart disease of native coronary artery without angina pectoris: Secondary | ICD-10-CM

## 2014-01-22 NOTE — Progress Notes (Signed)
Quick Note:  Echo results: Good news: Essentially normal echocardiogram and normal pump function and normal valve function. EF: 55-60%. Abnormal relaxation - which is not an unusual finding. No regional wall motion abnormalities. No signs to suggest heart attack..   ______

## 2014-01-22 NOTE — Progress Notes (Signed)
2D Echocardiogram Complete.  01/22/2014   Tawfiq Favila, RDCS  

## 2014-01-26 ENCOUNTER — Telehealth: Payer: Self-pay | Admitting: *Deleted

## 2014-01-26 NOTE — Telephone Encounter (Signed)
Spoke to patient. Result given . Verbalized understanding  

## 2014-01-26 NOTE — Telephone Encounter (Signed)
Message copied by Raiford Simmonds on Mon Jan 26, 2014 10:05 AM ------      Message from: Leonie Man      Created: Thu Jan 22, 2014  6:12 PM       Echo results:      Good news: Essentially normal echocardiogram and normal pump function and normal valve function.   EF: 55-60%.      Abnormal relaxation - which is not an unusual finding.      No regional wall motion abnormalities.  No signs to suggest heart attack.Marland Kitchen             ------

## 2014-02-02 ENCOUNTER — Telehealth: Payer: Self-pay | Admitting: Cardiology

## 2014-02-02 MED ORDER — CLOPIDOGREL BISULFATE 75 MG PO TABS: 75.0000 mg | ORAL_TABLET | Freq: Every day | ORAL | Status: DC

## 2014-02-02 MED ORDER — ATORVASTATIN CALCIUM 20 MG PO TABS: 20.0000 mg | ORAL_TABLET | Freq: Every day | ORAL | Status: DC

## 2014-02-02 NOTE — Telephone Encounter (Signed)
Spoke with patient and reassured him of normal echo. Routed stress test and echo to PCP (patient has appmt on 10/26) and he will talk with her about Dr. Allison Quarry recommendations

## 2014-02-02 NOTE — Telephone Encounter (Signed)
Patient notified that Rx was sent in on 10/5 for lipitor and plavix to [facility]. #30 sent to local pharmacy and patient was advised to contact mail order pharm.   Patient has stopped metoprolol (weaned himself off quicker than 2 weeks per AVS instructions). He still feels worn out, feels "blah", and hasn't felt good in last 3 months.   Deferred to Dr. Ellyn Hack as Juluis Rainier

## 2014-02-02 NOTE — Telephone Encounter (Signed)
Pt called in stating that Dr. Ellyn Hack was suppose to call in a new prescription for him to his mail order pharmacy over a week ago and he is stating that he still has not received it. He is not sure what the medication was and he is starting to feel bad. Please call  Thanks

## 2014-02-02 NOTE — Telephone Encounter (Signed)
As an addendum - the recent Echo looked normal..   Leonie Man, MD

## 2014-02-02 NOTE — Telephone Encounter (Signed)
Probably needs more detailed PCP eval - ? Anemia, thyroid level etc.  I hoped that stopping BB would make a difference, neither change I made led to a difference.  [place]

## 2014-02-05 ENCOUNTER — Encounter (HOSPITAL_COMMUNITY): Payer: Self-pay | Admitting: Emergency Medicine

## 2014-02-05 ENCOUNTER — Ambulatory Visit (HOSPITAL_COMMUNITY): Payer: Medicare Other | Attending: Emergency Medicine

## 2014-02-05 ENCOUNTER — Emergency Department (INDEPENDENT_AMBULATORY_CARE_PROVIDER_SITE_OTHER)
Admission: EM | Admit: 2014-02-05 | Discharge: 2014-02-05 | Disposition: A | Discharge status: Home / Self Care | Payer: Medicare Other | Source: Home / Self Care | Attending: Emergency Medicine | Admitting: Emergency Medicine

## 2014-02-05 DIAGNOSIS — I2510 Atherosclerotic heart disease of native coronary artery without angina pectoris: Secondary | ICD-10-CM | POA: Insufficient documentation

## 2014-02-05 DIAGNOSIS — Z87891 Personal history of nicotine dependence: Secondary | ICD-10-CM | POA: Insufficient documentation

## 2014-02-05 DIAGNOSIS — Z9861 Coronary angioplasty status: Secondary | ICD-10-CM | POA: Diagnosis not present

## 2014-02-05 DIAGNOSIS — J441 Chronic obstructive pulmonary disease with (acute) exacerbation: Secondary | ICD-10-CM | POA: Diagnosis not present

## 2014-02-05 DIAGNOSIS — R05 Cough: Secondary | ICD-10-CM | POA: Insufficient documentation

## 2014-02-05 DIAGNOSIS — J449 Chronic obstructive pulmonary disease, unspecified: Secondary | ICD-10-CM | POA: Insufficient documentation

## 2014-02-05 DIAGNOSIS — J029 Acute pharyngitis, unspecified: Secondary | ICD-10-CM | POA: Diagnosis not present

## 2014-02-05 DIAGNOSIS — R059 Cough, unspecified: Secondary | ICD-10-CM

## 2014-02-05 LAB — POCT URINALYSIS DIP (DEVICE)
Glucose, UA: NEGATIVE mg/dL
Hgb urine dipstick: NEGATIVE
Ketones, ur: NEGATIVE mg/dL
Leukocytes, UA: NEGATIVE
Nitrite: NEGATIVE
Protein, ur: NEGATIVE mg/dL
Specific Gravity, Urine: 1.02 (ref 1.005–1.030)
Urobilinogen, UA: 1 mg/dL (ref 0.0–1.0)
pH: 5.5 (ref 5.0–8.0)

## 2014-02-05 LAB — POCT I-STAT, CHEM 8
BUN: 17 mg/dL (ref 6–23)
Calcium, Ion: 1.03 mmol/L — ABNORMAL LOW (ref 1.13–1.30)
Chloride: 104 mEq/L (ref 96–112)
Creatinine, Ser: 1 mg/dL (ref 0.50–1.35)
Glucose, Bld: 109 mg/dL — ABNORMAL HIGH (ref 70–99)
HCT: 49 % (ref 39.0–52.0)
Hemoglobin: 16.7 g/dL (ref 13.0–17.0)
Potassium: 3.8 mEq/L (ref 3.7–5.3)
Sodium: 136 mEq/L — ABNORMAL LOW (ref 137–147)
TCO2: 23 mmol/L (ref 0–100)

## 2014-02-05 LAB — TSH: TSH: 3.24 u[IU]/mL (ref 0.350–4.500)

## 2014-02-05 MED ORDER — DOXYCYCLINE HYCLATE 100 MG PO TABS: 100.0000 mg | ORAL_TABLET | Freq: Two times a day (BID) | ORAL | Status: DC

## 2014-02-05 MED ORDER — BENZONATATE 200 MG PO CAPS: 200.0000 mg | ORAL_CAPSULE | Freq: Three times a day (TID) | ORAL | Status: DC | PRN

## 2014-02-05 MED ORDER — PREDNISONE 20 MG PO TABS: ORAL_TABLET | ORAL | Status: DC

## 2014-02-05 NOTE — ED Provider Notes (Signed)
Chief Complaint   Cough   History of Present Illness   Paul [name] is a 63 year old male with COPD who has had a two-day history of cough productive yellow sputum, wheezing, chest tightness, and shortness of breath. For the past 3 months he is now tired and rundown not had much energy. He went to see his heart specialist 2 weeks ago because of his fatigue and shortness of breath. He underwent a complete workup including an echocardiogram and BNP all of which were normal. He was told by his cardiologist he did not have congestive heart failure and referred back to his primary care Dr. he denies any fevers, chills, sweats, nasal congestion, rhinorrhea. He does have a slight sore throat. No chest pain, tightness, or pressure. He denies any GI symptoms. He has sleep apnea and is on oxygen at nighttime. He also has gastroesophageal reflux.  Review of Systems   Other than as noted above, the patient denies any of the following symptoms: Systemic:  No fevers, chills, sweats, or weight loss. ENT:  No nasal congestion, sneezing, itching, postnasal drip, sinus pressure, headache, sore throat, or hoarseness. Lungs:  No wheezing, shortness of breath, chest tightness or congestion. Heart:  No chest pain, tightness, pressure, PND, orthopnea, or ankle edema. GI:  No indigestion, heartburn, waterbrash, burping, abdominal pain, nausea, or vomiting.  [place]   Past medical history, family history, social history, meds, and allergies were reviewed. Current meds include albuterol, to turn a, AndroGel, Lipitor, Plavix, colchicine, Nexium, Advair, nitroglycerin, Prilosec, Spiriva, and Ambien. He's allergic to codeine and duloxetine. He has a history of angina, coronary artery bypass, hypertension, hyperlipidemia, obstructive sleep apnea, COPD, gout, and obesity. He is still smoking cigarettes and is trying to quit.  Physical Examination     Vital signs:  BP 144/84  Pulse 80  Temp(Src) 98 F (36.7 C) (Oral)   Resp 18  SpO2 97% General:  Alert and oriented.  In no distress.  Skin warm and dry. ENT: TMs and ear canals normal.  Nasal mucosa normal, without drainage.  Pharynx clear without exudate or drainage.  No intraoral lesions. Neck:  No adenopathy, tenderness or mass.  No JVD. Lungs:  No respiratory distress.  Breath sounds clear and equal bilaterally.  He has rales at both bases. No wheezes rhonchi, good air movement. Heart:  Regular rhythm, no gallops or murmers.  No pedal edema. Abdomon:  Soft and nontender.  No organomegaly or mass. Extremities: No edema, pulses were full.  Radiology   Dg Chest 2 View  02/05/2014   CLINICAL DATA:  2-3 days of productive cough with sore throat ; history of COPD and previous tobacco use; history of coronary artery disease treated with angioplasty.  EXAM: CHEST  2 VIEW  COMPARISON:  PA and lateral chest x-ray of August 11, 2010  FINDINGS: The lungs are well-expanded and clear. The heart and pulmonary vascularity are within the limits of normal. The mediastinum is normal in width. There is no pleural effusion. The bony thorax exhibits no acute abnormalities.  IMPRESSION: There is no acute cardiopulmonary abnormality.   Electronically Signed   By: Delanna Blacketer  [place]   On: 02/05/2014 13:43   Assessment   The primary encounter diagnosis was Cough. A diagnosis of COPD exacerbation was also pertinent to this visit.  There is no evidence of congestive heart failure. He's undergone a complete cardiology workup by his cardiologist within the last 2 weeks. I think this is all COPD. He doesn't have any high risk  factors for hospitalization. I think he can be treated at home. Followup with his primary care physician and with the pulmonologist. He was given strict return cautions to come back if his breathing should become worse.  Plan     1.  Meds:  The following meds were prescribed:   Discharge Medication List as of 02/05/2014  2:21 PM    START taking these medications    Details  benzonatate (TESSALON) 200 MG capsule Take 1 capsule (200 mg total) by mouth 3 (three) times daily as needed for cough., Starting 02/05/2014, Until Discontinued, Normal    doxycycline (VIBRA-TABS) 100 MG tablet Take 1 tablet (100 mg total) by mouth 2 (two) times daily., Starting 02/05/2014, Until Discontinued, Normal    predniSONE (DELTASONE) 20 MG tablet Take 3 daily for 5 days, 2 daily for 5 days, 1 daily for 5 days., Normal        2.  Patient Education/Counseling:  The patient was given appropriate handouts, self care instructions, and instructed in symptomatic relief.    3.  Follow up:  The patient was told to follow up here if no better in 3 to 4 days, or sooner if becoming worse in any way, and given some red flag symptoms such as difficulty breathing or chest pain which would prompt immediate return.  Followup with his primary care physician, Dr. Florina Ou, and also with pulmonologist, Dr. Baltazar Apo.       Harden Mo, MD 02/05/14 2053

## 2014-02-05 NOTE — ED Notes (Signed)
Patient c/o productive cough x 2 days with yellow and clear phelgm. Patient states he previously had cold sx including nasal and chest congestion. Patient reports at night he has SOB even while using oxygen. Patient is in NAD.

## 2014-02-05 NOTE — Discharge Instructions (Signed)
Chronic Asthmatic Bronchitis Chronic asthmatic bronchitis is a complication of persistent asthma. After a period of time with asthma, some people develop airflow obstruction that is present all the time, even when not having an asthma attack.There is also persistent inflammation of the airways, and the bronchial tubes produce more mucus. Chronic asthmatic bronchitis usually is a permanent problem with the lungs. CAUSES  Chronic asthmatic bronchitis happens most often in people who have asthma and also smoke cigarettes. Occasionally, it can happen to a person with long-standing or severe asthma even if the person is not a smoker. SIGNS AND SYMPTOMS  Chronic asthmatic bronchitis usually causes symptoms of both asthma and chronic bronchitis, including:   Coughing.  Increased sputum production.  Wheezing and shortness of breath.  Chest discomfort.  Recurring infections. DIAGNOSIS  Your health care provider will take a medical history and perform a physical exam. Chronic asthmatic bronchitis is suspected when a person with asthma has abnormal results on breathing tests (pulmonary function tests) even when breathing symptoms are at their best. Other tests, such as a chest X-ray, may be performed to rule out other conditions.  TREATMENT  Treatment involves controlling symptoms with medicine and lifestyle changes.  Your health care provider may prescribe asthma medicines, including inhaler and nebulizer medicines.  Infection can be treated with medicine to kill germs (antibiotics). Serious infections may require hospitalization. These can include:  Pneumonia.  Sinus infections.  Acute bronchitis.   Preventing infection and hospitalization is very important. Get an influenza vaccination every year as directed by your health care provider. Ask your health care provider whether you need a pneumonia vaccine.  Ask your health care provider whether you would benefit from a pulmonary  rehabilitation program. HOME CARE INSTRUCTIONS  Take medicines only as directed by your health care provider.  If you are a cigarette smoker, the most important thing that you can do is quit. Talk to your health care provider for help with quitting smoking.  Avoid pollen, dust, animal dander, molds, smoke, and other things that cause attacks.  Regular exercise is very important to help you feel better. Discuss possible exercise routines with your health care provider.  If animal dander is the cause of asthma, you may not be able to keep pets.  It is important that you:  Become educated about your medical condition.  Participate in maintaining wellness.  Seek medical care as directed. Delay in seeking medical care could cause permanent injury and may be a risk to your life. SEEK MEDICAL CARE IF:  You have wheezing and shortness of breath even if taking medicine to prevent attacks.  You have muscle aches, chest pain, or thickening of sputum.  Your sputum changes from clear or white to yellow, green, gray, or bloody. SEEK IMMEDIATE MEDICAL CARE IF:  Your usual medicines do not stop your wheezing.  You have increased coughing or shortness of breath or both.  You have increased difficulty breathing.  You have any problems from the medicine you are taking, such as a rash, itching, swelling, or trouble breathing. MAKE SURE YOU:   Understand these instructions.  Will watch your condition.  Will get help right away if you are not doing well or get worse. Document Released: 01/19/2006 Document Revised: 08/18/2013 Document Reviewed: 05/12/2013 [employer] Patient Information 2015 [employer]. This information is not intended to replace advice given to you by your health care provider. Make sure you discuss any questions you have with your health care provider.  

## 2014-02-09 DIAGNOSIS — J42 Unspecified chronic bronchitis: Secondary | ICD-10-CM | POA: Diagnosis not present

## 2014-02-09 DIAGNOSIS — M5030 Other cervical disc degeneration, unspecified cervical region: Secondary | ICD-10-CM | POA: Diagnosis not present

## 2014-02-09 DIAGNOSIS — Z23 Encounter for immunization: Secondary | ICD-10-CM | POA: Diagnosis not present

## 2014-02-09 DIAGNOSIS — G4700 Insomnia, unspecified: Secondary | ICD-10-CM | POA: Diagnosis not present

## 2014-02-11 ENCOUNTER — Telehealth (HOSPITAL_COMMUNITY): Payer: Self-pay | Admitting: *Deleted

## 2014-02-11 NOTE — ED Notes (Signed)
TSH 3.240. Dr. Jake Michaelis asked me to call pt. His normal result. I called pt. Pt. verified x 2 and given result.  Pt.instructed to notify his PCP we had done it. 02/11/2014

## 2014-02-19 ENCOUNTER — Ambulatory Visit (INDEPENDENT_AMBULATORY_CARE_PROVIDER_SITE_OTHER): Payer: Medicare Other | Admitting: Internal Medicine

## 2014-02-19 ENCOUNTER — Encounter: Payer: Self-pay | Admitting: Internal Medicine

## 2014-02-19 VITALS — BP 138/86 | HR 83 | Ht 65.0 in | Wt 195.0 lb

## 2014-02-19 DIAGNOSIS — Z720 Tobacco use: Secondary | ICD-10-CM

## 2014-02-19 DIAGNOSIS — I2510 Atherosclerotic heart disease of native coronary artery without angina pectoris: Secondary | ICD-10-CM | POA: Diagnosis not present

## 2014-02-19 DIAGNOSIS — Z9861 Coronary angioplasty status: Secondary | ICD-10-CM | POA: Diagnosis not present

## 2014-02-19 DIAGNOSIS — F17210 Nicotine dependence, cigarettes, uncomplicated: Secondary | ICD-10-CM

## 2014-02-19 DIAGNOSIS — J449 Chronic obstructive pulmonary disease, unspecified: Secondary | ICD-10-CM | POA: Diagnosis not present

## 2014-02-19 DIAGNOSIS — R0609 Other forms of dyspnea: Secondary | ICD-10-CM | POA: Diagnosis not present

## 2014-02-19 DIAGNOSIS — R0600 Dyspnea, unspecified: Secondary | ICD-10-CM

## 2014-02-19 MED ORDER — BUDESONIDE-FORMOTEROL FUMARATE 160-4.5 MCG/ACT IN AERO: INHALATION_SPRAY | RESPIRATORY_TRACT | Status: DC

## 2014-02-19 NOTE — Progress Notes (Signed)
Subjective:     Patient ID: Paul [name], male   DOB: [birth year],   MRN: [id number]  HPI 55 yowm min still active smoking with variably sob/fatigue onset of 2010 dx as copd but not consistent with inhalers and worse since 11/2013 referred by Dr Modena Morrow to pulmonary clinic 02/19/2014    02/19/2014 1st [place] [facility] office visit/ Jehieli Brassell  / supposed to be on spiriva but admits doesn't use it consistently Chief Complaint  Patient presents with  . Pulmonary Consult    Pt referred by Florina Ou for eval of COPD. Pt states having lack of energy, "burning in chest" and occ SOB for the past 2-3 months. He has multiple inhalers that he only uses PRN.   doe = when walks dog daily have to go slow x 30 min at and feq stops Onset was insidious , pattern in slowly progressive assoc with subjective wheeze All symptoms better when he remembers to use in inhalers    No obvious day to day or daytime variabilty or assoc    chest tightness  overt sinus or hb symptoms. No unusual exp hx or h/o childhood pna/ asthma or knowledge of premature birth.  Sleeping ok without nocturnal  or early am exacerbation  of respiratory  c/o's or need for noct saba. Also denies any obvious fluctuation of symptoms with weather or environmental changes or other aggravating or alleviating factors except as outlined above   Current Medications, Allergies, Complete Past Medical History, Past Surgical History, Family History, and Social History were reviewed in [employer] record.  ROS  The following are not active complaints unless bolded sore throat, dysphagia, dental problems, itching, sneezing,  nasal congestion or excess/ purulent secretions, ear ache,   fever, chills, sweats, unintended wt loss, pleuritic or exertional cp, hemoptysis,  orthopnea pnd or leg swelling, presyncope, palpitations, heartburn, abdominal pain, anorexia, nausea, vomiting, diarrhea  or change in bowel or urinary habits, change in stools or  urine, dysuria,hematuria,  rash, arthralgias, visual complaints, headache, numbness weakness or ataxia or problems with walking or coordination,  change in mood/affect or memory.        Review of Systems     Objective:   Physical Exam    amb wm nad  Wt Readings from Last 3 Encounters:  02/19/14 195 lb (88.451 kg)  01/19/14 199 lb 11.2 oz (90.583 kg)  12/04/13 199 lb (90.266 kg)    Vital signs reviewed   HEENT mild turbinate edema.  Oropharynx no thrush or excess pnd or cobblestoning.  No JVD or cervical adenopathy. Mild accessory muscle hypertrophy. Trachea midline, nl thryroid. Chest was hyperinflated by percussion with diminished breath sounds and moderate increased exp time without wheeze. Hoover sign positive at mid inspiration. Regular rate and rhythm without murmur gallop or rub or increase P2 or edema.  Abd: no hsm, nl excursion. Ext warm without cyanosis or clubbing.    02/05/14 There is no acute cardiopulmonary abnormality. Assessment:

## 2014-02-19 NOTE — Patient Instructions (Addendum)
Plan A = automatic = symbicrt 160 Take 2 puffs first thing in am and then another 2 puffs about 12 hours later and stop spiriva and advair    Plan B = backup = Only use your albuterol as a rescue medication to be used if you can't catch your breath by resting or doing a relaxed purse lip breathing pattern.  - The less you use it, the better it will work when you need it. - Ok to use up to 2 puffs  every 4 hours if you must but call for immediate appointment if use goes up over your usual need - Don't leave home without it !!  (think of it like the spare tire for your car)   Plan C = Nebulizer, only use it if you try B and it doesn't work  Please schedule a follow up office visit in 4 weeks, sooner if needed with pfts

## 2014-02-22 DIAGNOSIS — F17210 Nicotine dependence, cigarettes, uncomplicated: Secondary | ICD-10-CM | POA: Insufficient documentation

## 2014-02-22 HISTORY — DX: Nicotine dependence, cigarettes, uncomplicated: F17.210

## 2014-02-22 NOTE — Assessment & Plan Note (Signed)
Appears to be proportionate to severity of copd

## 2014-02-22 NOTE — Assessment & Plan Note (Signed)
He has at least mild copd and still smoking and not remembering his meds  DDX of  difficult airways management all start with A and  include Adherence, Ace Inhibitors, Acid Reflux, Active Sinus Disease, Alpha 1 Antitripsin deficiency, Anxiety masquerading as Airways dz,  ABPA,  allergy(esp in young), Aspiration (esp in elderly), Adverse effects of DPI,  Active smokers, plus two Bs  = Bronchiectasis and Beta blocker use..and one C= CHF   Adherence is always the initial "prime suspect" and is a multilayered concern that requires a "trust but verify" approach in every patient - starting with knowing how to use medications, especially inhalers, correctly, keeping up with refills and understanding the fundamental difference between maintenance and prns vs those medications only taken for a very short course and then stopped and not refilled.  The proper method of use, as well as anticipated side effects, of a metered-dose inhaler are discussed and demonstrated to the patient. Improved effectiveness after extensive coaching during this visit to a level of approximately  75% so try symbicort 160 2bid  Active smoking discussed separately  ? Adverse effect of dpi> doubt it but he does ok with hfa and is not convinced the spiriva is helping him so unlikely to remember to take it   See instructions for specific recommendations which were reviewed directly with the patient who was given a copy with highlighter outlining the key components.   Needs to return for full pfts in 4-6 weeks

## 2014-02-22 NOTE — Assessment & Plan Note (Signed)
>   3 mi  I took an extended  opportunity with this patient to outline the consequences of continued cigarette use  in airway disorders based on all the data we have from the multiple national lung health studies (perfomed over decades at millions of dollars in cost)  indicating that smoking cessation, not choice of inhalers or physicians, is the most important aspect of care.   

## 2014-02-22 NOTE — Assessment & Plan Note (Addendum)
>>  ASSESSMENT AND PLAN FOR OSA AND COPD OVERLAP SYNDROME (HCC) WRITTEN ON 04/23/2022  9:44 PM BY Brandonn Capelli E, NP  >>ASSESSMENT AND PLAN FOR CHRONIC OBSTRUCTIVE PULMONARY DISEASE, UNSPECIFIED ([facility]CC) WRITTEN ON 02/22/2014  5:51 AM BY WERT, MICHAEL B, MD  He has at least mild copd and still smoking and not remembering his meds  DDX of  difficult airways management all start with A and  include Adherence, Ace Inhibitors, Acid Reflux, Active Sinus Disease, Alpha 1 Antitripsin deficiency, Anxiety masquerading as Airways dz,  ABPA,  allergy(esp in young), Aspiration (esp in elderly), Adverse effects of DPI,  Active smokers, plus two Bs  = Bronchiectasis and Beta blocker use..and one C= CHF   Adherence is always the initial "prime suspect" and is a multilayered concern that requires a "trust but verify" approach in every patient - starting with knowing how to use medications, especially inhalers, correctly, keeping up with refills and understanding the fundamental difference between maintenance and prns vs those medications only taken for a very short course and then stopped and not refilled.  The proper method of use, as well as anticipated side effects, of a metered-dose inhaler are discussed and demonstrated to the patient. Improved effectiveness after extensive coaching during this visit to a level of approximately  75% so try symbicort 160 2bid  Active smoking discussed separately  ? Adverse effect of dpi> doubt it but he does ok with hfa and is not convinced the spiriva is helping him so unlikely to remember to take it   See instructions for specific recommendations which were reviewed directly with the patient who was given a copy with highlighter outlining the key components.   Needs to return for full pfts in 4-6 weeks   >>ASSESSMENT AND PLAN FOR DOE (DYSPNEA ON EXERTION) WRITTEN ON 02/22/2014  5:52 AM BY WERT, MICHAEL B, MD  Appears to be proportionate to severity of copd

## 2014-03-10 ENCOUNTER — Other Ambulatory Visit: Payer: Self-pay | Admitting: Cardiology

## 2014-03-10 MED ORDER — ATORVASTATIN CALCIUM 20 MG PO TABS: 20.0000 mg | ORAL_TABLET | Freq: Every day | ORAL | Status: DC

## 2014-03-10 MED ORDER — CLOPIDOGREL BISULFATE 75 MG PO TABS: 75.0000 mg | ORAL_TABLET | Freq: Every day | ORAL | Status: DC

## 2014-03-10 NOTE — Telephone Encounter (Signed)
Rx was sent to pharmacy electronically. 

## 2014-03-10 NOTE — Telephone Encounter (Signed)
Pt new prescriptions for his Clopidogrel 75 mg #30 and Atorvastatin 20 mg #30 and refills. Please call to [facility]

## 2014-03-20 ENCOUNTER — Other Ambulatory Visit: Payer: Self-pay | Admitting: Internal Medicine

## 2014-03-20 DIAGNOSIS — R0600 Dyspnea, unspecified: Secondary | ICD-10-CM

## 2014-03-23 ENCOUNTER — Ambulatory Visit: Payer: Medicare Other | Admitting: Internal Medicine

## 2014-03-23 ENCOUNTER — Telehealth: Payer: Self-pay | Admitting: Internal Medicine

## 2014-03-23 NOTE — Telephone Encounter (Signed)
Pt not in lobby when called for samples. Nothing further needed. Will sign off.

## 2014-03-26 ENCOUNTER — Telehealth: Payer: Self-pay | Admitting: Internal Medicine

## 2014-03-26 ENCOUNTER — Encounter (HOSPITAL_COMMUNITY): Payer: Self-pay | Admitting: Cardiology

## 2014-03-26 MED ORDER — BUDESONIDE-FORMOTEROL FUMARATE 160-4.5 MCG/ACT IN AERO: INHALATION_SPRAY | RESPIRATORY_TRACT | Status: DC

## 2014-03-26 NOTE — Telephone Encounter (Signed)
2 samples have been left up front for the pt .  i called and spoke with pt and he is aware and will come by and pick these up.

## 2014-04-13 ENCOUNTER — Ambulatory Visit (INDEPENDENT_AMBULATORY_CARE_PROVIDER_SITE_OTHER): Payer: Medicare Other | Admitting: Internal Medicine

## 2014-04-13 ENCOUNTER — Encounter: Payer: Self-pay | Admitting: Internal Medicine

## 2014-04-13 VITALS — BP 160/90 | HR 82 | Ht 65.0 in | Wt 201.0 lb

## 2014-04-13 DIAGNOSIS — R0600 Dyspnea, unspecified: Secondary | ICD-10-CM

## 2014-04-13 DIAGNOSIS — I2510 Atherosclerotic heart disease of native coronary artery without angina pectoris: Secondary | ICD-10-CM | POA: Diagnosis not present

## 2014-04-13 DIAGNOSIS — Z9861 Coronary angioplasty status: Secondary | ICD-10-CM | POA: Diagnosis not present

## 2014-04-13 DIAGNOSIS — J449 Chronic obstructive pulmonary disease, unspecified: Secondary | ICD-10-CM | POA: Diagnosis not present

## 2014-04-13 LAB — PULMONARY FUNCTION TEST
DL/VA % pred: 81 %
DL/VA: 3.47 ml/min/mmHg/L
DLCO unc % pred: 71 %
DLCO unc: 18.24 ml/min/mmHg
FEF 25-75 Post: 1.31 L/sec
FEF 25-75 Pre: 1.31 L/sec
FEF2575-%Change-Post: 0 %
FEF2575-%Pred-Post: 57 %
FEF2575-%Pred-Pre: 57 %
FEV1-%Change-Post: 0 %
FEV1-%Pred-Post: 81 %
FEV1-%Pred-Pre: 81 %
FEV1-Post: 2.29 L
FEV1-Pre: 2.29 L
FEV1FVC-%Change-Post: 0 %
FEV1FVC-%Pred-Pre: 90 %
FEV6-%Change-Post: 0 %
FEV6-%Pred-Post: 91 %
FEV6-%Pred-Pre: 91 %
FEV6-Post: 3.28 L
FEV6-Pre: 3.28 L
FEV6FVC-%Change-Post: 0 %
FEV6FVC-%Pred-Post: 104 %
FEV6FVC-%Pred-Pre: 103 %
FVC-%Change-Post: 0 %
FVC-%Pred-Post: 88 %
FVC-%Pred-Pre: 88 %
FVC-Post: 3.35 L
FVC-Pre: 3.36 L
Post FEV1/FVC ratio: 68 %
Post FEV6/FVC ratio: 98 %
Pre FEV1/FVC ratio: 68 %
Pre FEV6/FVC Ratio: 97 %
RV % pred: 99 %
RV: 2.06 L
TLC % pred: 85 %
TLC: 5.13 L

## 2014-04-13 MED ORDER — BUDESONIDE-FORMOTEROL FUMARATE 160-4.5 MCG/ACT IN AERO: INHALATION_SPRAY | RESPIRATORY_TRACT | Status: DC

## 2014-04-13 NOTE — Progress Notes (Signed)
Subjective:     Patient ID: Paul [name], male   DOB: [birth year]    MRN: [id number]  Brief patient profile:  63 yowm min still active smoking with variably sob/fatigue onset of 2010 dx as copd but not consistent with inhalers and worse since 11/2013 referred by Dr Modena Morrow to pulmonary clinic 02/19/2014 with PFTs c/w GOLD I criteria 04/13/2014      History of Present Illness  02/19/2014 1st [place] [facility] office visit/ Ark Agrusa  / supposed to be on spiriva but admits doesn't use it consistently Chief Complaint  Patient presents with  . Pulmonary Consult    Pt referred by Florina Ou for eval of COPD. Pt states having lack of energy, "burning in chest" and occ SOB for the past 2-3 months. He has multiple inhalers that he only uses PRN.   doe = when walks dog daily have to go slow x 30 min at and feq stops Onset was insidious summer of 2015, pattern in slowly progressive assoc with subjective wheeze All symptoms better when he remembers to use in inhalers  rec Plan A = automatic = symbicrt 160 Take 2 puffs first thing in am and then another 2 puffs about 12 hours later and stop spiriva and advair  Plan B = backup = Only use your albuterol inhaler as a rescue medication   Plan C = Nebulizer, only use it if you try B and it doesn't work    04/13/2014 f/u ov/Janaa Acero re: still smoking/ GOLD I copd/ symbicoret 160 2bid and no saba  Chief Complaint  Patient presents with  . Follow-up    Pt states that his breathing has improved since his last visit. No new co's today. He has not had to use rescue inhaler or neb since last visit.   Not limited by breathing from desired activities     No obvious day to day or daytime variabilty or assoc  Excess/ purulent mucus or   chest tightness  overt sinus or hb symptoms. No unusual exp hx or h/o childhood pna/ asthma or knowledge of premature birth.  Sleeping ok without nocturnal  or early am exacerbation  of respiratory  c/o's or need for noct saba. Also denies any  obvious fluctuation of symptoms with weather or environmental changes or other aggravating or alleviating factors except as outlined above   Current Medications, Allergies, Complete Past Medical History, Past Surgical History, Family History, and Social History were reviewed in [employer] record.  ROS  The following are not active complaints unless bolded sore throat, dysphagia, dental problems, itching, sneezing,  nasal congestion or excess/ purulent secretions, ear ache,   fever, chills, sweats, unintended wt loss, pleuritic or exertional cp, hemoptysis,  orthopnea pnd or leg swelling, presyncope, palpitations, heartburn, abdominal pain, anorexia, nausea, vomiting, diarrhea  or change in bowel or urinary habits, change in stools or urine, dysuria,hematuria,  rash, arthralgias, visual complaints, headache, numbness weakness or ataxia or problems with walking or coordination,  change in mood/affect or memory.             Objective:   Physical Exam   amb wm nad  04/13/2014      201  Wt Readings from Last 3 Encounters:  02/19/14 195 lb (88.451 kg)  01/19/14 199 lb 11.2 oz (90.583 kg)  12/04/13 199 lb (90.266 kg)    Vital signs reviewed   HEENT: nl dentition, turbinates, and orophanx. Nl external ear canals without cough reflex   NECK :  without  JVD/Nodes/TM/ nl carotid upstrokes bilaterally   LUNGS: no acc muscle use, clear to A and P bilaterally without cough on insp or exp maneuvers   CV:  RRR  no s3 or murmur or increase in P2, no edema   ABD:  soft and nontender with nl excursion in the supine position. No bruits or organomegaly, bowel sounds nl  MS:  warm without deformities, calf tenderness, cyanosis or clubbing  SKIN: warm and dry without lesions    NEURO:  alert, approp, no deficits     02/05/14 There is no acute cardiopulmonary abnormality. Assessment:

## 2014-04-13 NOTE — Progress Notes (Signed)
PFT done. Anuja Manka, CMA  

## 2014-04-13 NOTE — Patient Instructions (Addendum)
The key is to stop smoking completely before smoking completely stops you- it is not too late as you only have the earliest stage of COPD (quite mild)   Plan A = automatic = symbicrt 160 Take 2 puffs first thing in am and then another 2 puffs about 12 hours later    Plan B = backup = Only use your albuterol as a rescue medication to be used if you can't catch your breath by resting or doing a relaxed purse lip breathing pattern.  - The less you use it, the better it will work when you need it. - Ok to use up to 2 puffs  every 4 hours if you must but call for immediate appointment if use goes up over your usual need - Don't leave home without it !!  (think of it like the spare tire for your car)   Plan C = Nebulizer, only use it if you try B and it doesn't work and call me if start needing more often   If you are satisfied with your treatment plan,  let your doctor know and he/she can either refill your medications or you can return here when your prescription runs out.     If in any way you are not 100% satisfied,  please tell us.  If 100% better, tell your friends!  Pulmonary follow up is as needed

## 2014-04-13 NOTE — Assessment & Plan Note (Signed)
>>  ASSESSMENT AND PLAN FOR CHRONIC OBSTRUCTIVE PULMONARY DISEASE, UNSPECIFIED ([place]) WRITTEN ON 04/13/2014  1:34 PM BY Melvyn Novas, MICHAEL B, MD  - 04/13/2014 p extensive coaching HFA effectiveness =    90%  - 04/13/2014 PFTs FEV1  2.29 p am symbicort 160x 2 (81%) ratio 68 and no change p saba/ DLCO 71 corrects to 81%   .The proper method of use, as well as anticipated side effects, of a metered-dose inhaler are discussed and demonstrated to the patient. Improved effectiveness after extensive coaching during this visit to a level of approximately  90%   I had an extended discussion with the patient reviewing all relevant studies completed to date and  lasting 15 to 20 minutes of a 25 minute visit on the following ongoing concerns:    I reviewed the Flethcher curve with patient that basically indicates  if you quit smoking when your best day FEV1 is still well preserved (as is clearly  the case here)  it is highly unlikely you will progress to severe disease and informed the patient there was no medication on the market that has proven to change the curve or the likelihood of progression.  Therefore stopping smoking and maintaining abstinence is the most important aspect of care, not choice of inhalers or for that matter, doctors.  He is now on just symbicort 160 2bid automatically in place of the 4 he came in on but wasn't using very effectively and is so mild that pulmonary f/u is prn

## 2014-04-13 NOTE — Assessment & Plan Note (Signed)
-   04/13/2014 p extensive coaching HFA effectiveness =    90%  - 04/13/2014 PFTs FEV1  2.29 p am symbicort 160x 2 (81%) ratio 68 and no change p saba/ DLCO 71 corrects to 81%   .The proper method of use, as well as anticipated side effects, of a metered-dose inhaler are discussed and demonstrated to the patient. Improved effectiveness after extensive coaching during this visit to a level of approximately  90%   I had an extended discussion with the patient reviewing all relevant studies completed to date and  lasting 15 to 20 minutes of a 25 minute visit on the following ongoing concerns:    I reviewed the Flethcher curve with patient that basically indicates  if you quit smoking when your best day FEV1 is still well preserved (as is clearly  the case here)  it is highly unlikely you will progress to severe disease and informed the patient there was no medication on the market that has proven to change the curve or the likelihood of progression.  Therefore stopping smoking and maintaining abstinence is the most important aspect of care, not choice of inhalers or for that matter, doctors.  He is now on just symbicort 160 2bid automatically in place of the 4 he came in on but wasn't using very effectively and is so mild that pulmonary f/u is prn

## 2014-04-22 ENCOUNTER — Encounter (HOSPITAL_COMMUNITY): Payer: Self-pay | Admitting: *Deleted

## 2014-04-22 ENCOUNTER — Emergency Department (INDEPENDENT_AMBULATORY_CARE_PROVIDER_SITE_OTHER)
Admission: EM | Admit: 2014-04-22 | Discharge: 2014-04-22 | Disposition: A | Discharge status: Home / Self Care | Payer: Medicare Other | Source: Home / Self Care | Attending: Family Medicine | Admitting: Family Medicine

## 2014-04-22 DIAGNOSIS — M10072 Idiopathic gout, left ankle and foot: Secondary | ICD-10-CM

## 2014-04-22 DIAGNOSIS — M109 Gout, unspecified: Secondary | ICD-10-CM

## 2014-04-22 MED ORDER — TRAMADOL HCL 50 MG PO TABS: 50.0000 mg | ORAL_TABLET | Freq: Four times a day (QID) | ORAL | Status: DC | PRN

## 2014-04-22 MED ORDER — COLCHICINE 0.6 MG PO TABS: ORAL_TABLET | ORAL | Status: DC

## 2014-04-22 NOTE — ED Provider Notes (Signed)
CSN: [id number]     Arrival date & time 04/22/14  0908 History   First MD Initiated Contact with Patient 04/22/14 [phone]     Chief Complaint  Patient presents with  . Ankle Pain   (Consider location/radiation/quality/duration/timing/severity/associated sxs/prior Treatment) HPI Comments: Hx of gout. Feel this episode to be consistent with previous flares.  Patient is a 64 y.o. male presenting with ankle pain. The history is provided by the patient.  Ankle Pain Location:  Ankle Time since incident:  3 days Injury: no   Ankle location:  L ankle Chronicity:  New Prior injury to area:  No Associated symptoms: itching and swelling   Associated symptoms: no fever and no numbness     Past Medical History  Diagnosis Date  . History of Unstable angina 11/24/2011    Referred for cardiac catheterization  . CAD S/P percutaneous coronary angioplasty -- D1, Xience Xpedition DES 2.5 mm x 33 mm 11/24/2011    a) Mild-to-moderate 30-40% lesions in the RCA, LAD and Circumflex. b) CULPRIT LESION: long tubular 70-80% lesion in D1 with FFR of 0.7 --> PCI w/ Xience Xpedition DES 2.5 mm x 30 mm (2.65 MM); c) Lexiscan Myoview 11/2013: No Ischemia or Infarct (Inferior Gut Attenuation) EF 63%.  . Essential hypertension 11/24/2011  . Hyperlipidemia with target LDL less than 70 11/24/2011  . OSA (obstructive sleep apnea), uses oxygen at home did not tolerate cpap 11/24/2011  . COPD (chronic obstructive pulmonary disease)     "don't have full case of it; I'm right there at it"  . Exertional dyspnea, chronic   . Gout   . Obesity (BMI 30.0-34.9)    Past Surgical History  Procedure Laterality Date  . Coronary angioplasty with stent placement  11/23/2011    "1; first one"  . Cervical spine surgery  2012  . Left heart catheterization with coronary angiogram N/A 11/23/2011    Procedure: LEFT HEART CATHETERIZATION WITH CORONARY ANGIOGRAM;  Surgeon: Leonie Man, MD;  Location: [facility] CATH LAB;  Service: Cardiovascular;   Laterality: N/A;   Family History  Problem Relation Age of Onset  . Heart disease Sister   . Heart attack Brother   . Emphysema Father     smoked   History  Substance Use Topics  . Smoking status: Current Some Day Smoker -- 1.00 packs/day for 40 years    Types: Cigarettes  . Smokeless tobacco: Never Used     Comment: 02/18/14- smokes occ cig maybe 3 x per wk  . Alcohol Use: 0.0 oz/week    0 Not specified per week     Comment: social 2-3 drink a week    Review of Systems  Constitutional: Negative for fever.  Skin: Positive for itching.  All other systems reviewed and are negative.   Allergies  Codeine and Duloxetine hcl  Home Medications   Prior to Admission medications   Medication Sig Start Date End Date Taking? Authorizing Provider  albuterol (PROAIR HFA) 108 (90 BASE) MCG/ACT inhaler Inhale 2 puffs into the lungs every 6 (six) hours as needed for wheezing or shortness of breath.    Historical Provider, MD  albuterol (PROVENTIL) (2.5 MG/3ML) 0.083% nebulizer solution Take 2.5 mg by nebulization every 6 (six) hours as needed for wheezing or shortness of breath.    Historical Provider, MD  aspirin 325 MG tablet Take 325 mg by mouth daily.    Historical Provider, MD  atorvastatin (LIPITOR) 20 MG tablet Take 1 tablet (20 mg total) by mouth daily  at 6 PM. 03/10/14 06/05/16  Leonie Man, MD  budesonide-formoterol [facility]) 160-4.5 MCG/ACT inhaler Take 2 puffs first thing in am and then another 2 puffs about 12 hours later. 04/13/14   Tanda Rockers, MD  clopidogrel (PLAVIX) 75 MG tablet Take 1 tablet (75 mg total) by mouth daily. 03/10/14   Leonie Man, MD  colchicine (COLCRYS) 0.6 MG tablet Take two tabs by mouth at once then a third tablet one hour later. 04/22/14   Audelia Hives Michella Detjen, PA  nitroGLYCERIN (NITROSTAT) 0.4 MG SL tablet Place 1 tablet (0.4 mg total) under the tongue every 5 (five) minutes as needed for chest pain. 11/24/11 04/13/14  Isaiah Serge, NP   omeprazole (PRILOSEC) 20 MG capsule Take 20 mg by mouth daily.    Historical Provider, MD  oxyCODONE-acetaminophen (PERCOCET/ROXICET) 5-325 MG per tablet Take 1 tablet by mouth every 6 (six) hours as needed. 02/09/14   Historical Provider, MD  traMADol (ULTRAM) 50 MG tablet Take 1 tablet (50 mg total) by mouth every 6 (six) hours as needed for moderate pain or severe pain. 04/22/14   Audelia Hives Dekendrick Uzelac, PA  zolpidem (AMBIEN) 10 MG tablet Take 10 mg by mouth at bedtime.    Historical Provider, MD   BP 161/97 mmHg  Pulse 93  Temp(Src) 98.2 F (36.8 C) (Oral)  Resp 20  SpO2 96% Physical Exam  Constitutional: He is oriented to person, place, and time. He appears well-developed and well-nourished.  HENT:  Head: Normocephalic and atraumatic.  Eyes: Conjunctivae are normal.  Cardiovascular: Normal rate.   Pulmonary/Chest: Effort normal.  Musculoskeletal:       Left ankle: He exhibits decreased range of motion and swelling. He exhibits no ecchymosis, no deformity and no laceration. Tenderness. Achilles tendon normal.  ROM of left ankle only limited by discomfort Tenderness, STS and erythema without induration or fluctuance at left lateral ankle  Neurological: He is alert and oriented to person, place, and time.  Skin: Skin is warm and dry. There is erythema.  Psychiatric: He has a normal mood and affect. His behavior is normal.  Nursing note and vitals reviewed.   ED Course  Procedures (including critical care time) Labs Review Labs Reviewed - No data to display  Imaging Review No results found.   MDM   1. Acute gout of left ankle, unspecified cause    Colchicine and tramadol as prescribed with increased daily hydration at home and PCP follow up if no improvement.     Lutricia Feil, [place] 04/22/14 1017

## 2014-04-22 NOTE — ED Notes (Signed)
l   Ankle      Pain   And   Swelling      X  2-3   Days      denys     Any  injury    Has  Had  Gout  In  Past

## 2014-04-22 NOTE — Discharge Instructions (Signed)
Please make sure you are staying well hydrated. This can help prevent gout attacks. Drink at least 8-10, eight ounce glasses of water each day. If symptoms do not improve, please follow up with your doctor.  Gout Gout is an inflammatory arthritis caused by a buildup of uric acid crystals in the joints. Uric acid is a chemical that is normally present in the blood. When the level of uric acid in the blood is too high it can form crystals that deposit in your joints and tissues. This causes joint redness, soreness, and swelling (inflammation). Repeat attacks are common. Over time, uric acid crystals can form into masses (tophi) near a joint, destroying bone and causing disfigurement. Gout is treatable and often preventable. CAUSES  The disease begins with elevated levels of uric acid in the blood. Uric acid is produced by your body when it breaks down a naturally found substance called purines. Certain foods you eat, such as meats and fish, contain high amounts of purines. Causes of an elevated uric acid level include:  Being passed down from parent to child (heredity).  Diseases that cause increased uric acid production (such as obesity, psoriasis, and certain cancers).  Excessive alcohol use.  Diet, especially diets rich in meat and seafood.  Medicines, including certain cancer-fighting medicines (chemotherapy), water pills (diuretics), and aspirin.  Chronic kidney disease. The kidneys are no longer able to remove uric acid well.  Problems with metabolism. Conditions strongly associated with gout include:  Obesity.  High blood pressure.  High cholesterol.  Diabetes. Not everyone with elevated uric acid levels gets gout. It is not understood why some people get gout and others do not. Surgery, joint injury, and eating too much of certain foods are some of the factors that can lead to gout attacks. SYMPTOMS   An attack of gout comes on quickly. It causes intense pain with redness,  swelling, and warmth in a joint.  Fever can occur.  Often, only one joint is involved. Certain joints are more commonly involved:  Base of the big toe.  Knee.  Ankle.  Wrist.  Finger. Without treatment, an attack usually goes away in a few days to weeks. Between attacks, you usually will not have symptoms, which is different from many other forms of arthritis. DIAGNOSIS  Your caregiver will suspect gout based on your symptoms and exam. In some cases, tests may be recommended. The tests may include:  Blood tests.  Urine tests.  X-rays.  Joint fluid exam. This exam requires a needle to remove fluid from the joint (arthrocentesis). Using a microscope, gout is confirmed when uric acid crystals are seen in the joint fluid. TREATMENT  There are two phases to gout treatment: treating the sudden onset (acute) attack and preventing attacks (prophylaxis).  Treatment of an Acute Attack.  Medicines are used. These include anti-inflammatory medicines or steroid medicines.  An injection of steroid medicine into the affected joint is sometimes necessary.  The painful joint is rested. Movement can worsen the arthritis.  You may use warm or cold treatments on painful joints, depending which works best for you.  Treatment to Prevent Attacks.  If you suffer from frequent gout attacks, your caregiver may advise preventive medicine. These medicines are started after the acute attack subsides. These medicines either help your kidneys eliminate uric acid from your body or decrease your uric acid production. You may need to stay on these medicines for a very long time.  The early phase of treatment with preventive medicine can  be associated with an increase in acute gout attacks. For this reason, during the first few months of treatment, your caregiver may also advise you to take medicines usually used for acute gout treatment. Be sure you understand your caregiver's directions. Your caregiver may  make several adjustments to your medicine dose before these medicines are effective.  Discuss dietary treatment with your caregiver or dietitian. Alcohol and drinks high in sugar and fructose and foods such as meat, poultry, and seafood can increase uric acid levels. Your caregiver or dietitian can advise you on drinks and foods that should be limited. HOME CARE INSTRUCTIONS   Do not take aspirin to relieve pain. This raises uric acid levels.  Only take over-the-counter or prescription medicines for pain, discomfort, or fever as directed by your caregiver.  Rest the joint as much as possible. When in bed, keep sheets and blankets off painful areas.  Keep the affected joint raised (elevated).  Apply warm or cold treatments to painful joints. Use of warm or cold treatments depends on which works best for you.  Use crutches if the painful joint is in your leg.  Drink enough fluids to keep your urine clear or pale yellow. This helps your body get rid of uric acid. Limit alcohol, sugary drinks, and fructose drinks.  Follow your dietary instructions. Pay careful attention to the amount of protein you eat. Your daily diet should emphasize fruits, vegetables, whole grains, and fat-free or low-fat milk products. Discuss the use of coffee, vitamin C, and cherries with your caregiver or dietitian. These may be helpful in lowering uric acid levels.  Maintain a healthy body weight. SEEK MEDICAL CARE IF:   You develop diarrhea, vomiting, or any side effects from medicines.  You do not feel better in 24 hours, or you are getting worse. SEEK IMMEDIATE MEDICAL CARE IF:   Your joint becomes suddenly more tender, and you have chills or a fever. MAKE SURE YOU:   Understand these instructions.  Will watch your condition.  Will get help right away if you are not doing well or get worse. Document Released: 03/31/2000 Document Revised: 08/18/2013 Document Reviewed: 11/15/2011 [facility] Patient  Information 2015 [place], [place]. This information is not intended to replace advice given to you by your health care provider. Make sure you discuss any questions you have with your health care provider.

## 2014-05-14 DIAGNOSIS — G4700 Insomnia, unspecified: Secondary | ICD-10-CM | POA: Diagnosis not present

## 2014-05-14 DIAGNOSIS — M5030 Other cervical disc degeneration, unspecified cervical region: Secondary | ICD-10-CM | POA: Diagnosis not present

## 2014-06-08 ENCOUNTER — Telehealth: Payer: Self-pay | Admitting: *Deleted

## 2014-06-08 DIAGNOSIS — Z79899 Other long term (current) drug therapy: Secondary | ICD-10-CM | POA: Diagnosis not present

## 2014-06-08 DIAGNOSIS — M542 Cervicalgia: Secondary | ICD-10-CM | POA: Diagnosis not present

## 2014-06-08 DIAGNOSIS — M79609 Pain in unspecified limb: Secondary | ICD-10-CM | POA: Diagnosis not present

## 2014-06-08 DIAGNOSIS — G894 Chronic pain syndrome: Secondary | ICD-10-CM | POA: Diagnosis not present

## 2014-06-08 DIAGNOSIS — E785 Hyperlipidemia, unspecified: Secondary | ICD-10-CM

## 2014-06-08 DIAGNOSIS — I10 Essential (primary) hypertension: Secondary | ICD-10-CM

## 2014-06-08 NOTE — Telephone Encounter (Signed)
-----   Message from Raiford Simmonds, RN sent at 01/19/2014 12:45 PM EDT ----- [facility]  DUE MARCH 2016  MAIL FEB 2016

## 2014-06-08 NOTE — Telephone Encounter (Signed)
Mail letter and labslip cmp , lipid- due before 06/22/2014

## 2014-07-06 DIAGNOSIS — G894 Chronic pain syndrome: Secondary | ICD-10-CM | POA: Diagnosis not present

## 2014-07-06 DIAGNOSIS — M542 Cervicalgia: Secondary | ICD-10-CM | POA: Diagnosis not present

## 2014-07-06 DIAGNOSIS — M79609 Pain in unspecified limb: Secondary | ICD-10-CM | POA: Diagnosis not present

## 2014-08-17 ENCOUNTER — Other Ambulatory Visit: Payer: Self-pay

## 2014-08-17 MED ORDER — CLOPIDOGREL BISULFATE 75 MG PO TABS: 75.0000 mg | ORAL_TABLET | Freq: Every day | ORAL | Status: DC

## 2014-08-17 MED ORDER — ATORVASTATIN CALCIUM 20 MG PO TABS: 20.0000 mg | ORAL_TABLET | Freq: Every day | ORAL | Status: DC

## 2014-08-17 NOTE — Telephone Encounter (Signed)
Rx(s) sent to pharmacy electronically.  

## 2014-09-22 ENCOUNTER — Encounter: Payer: Self-pay | Admitting: *Deleted

## 2014-09-23 ENCOUNTER — Encounter: Payer: Self-pay | Admitting: *Deleted

## 2015-02-17 DIAGNOSIS — R202 Paresthesia of skin: Secondary | ICD-10-CM

## 2015-02-17 DIAGNOSIS — Z1389 Encounter for screening for other disorder: Secondary | ICD-10-CM | POA: Diagnosis not present

## 2015-02-17 DIAGNOSIS — I10 Essential (primary) hypertension: Secondary | ICD-10-CM | POA: Diagnosis not present

## 2015-02-17 DIAGNOSIS — Z9861 Coronary angioplasty status: Secondary | ICD-10-CM | POA: Diagnosis not present

## 2015-02-17 DIAGNOSIS — M25519 Pain in unspecified shoulder: Secondary | ICD-10-CM

## 2015-02-17 DIAGNOSIS — Z23 Encounter for immunization: Secondary | ICD-10-CM | POA: Diagnosis not present

## 2015-02-17 DIAGNOSIS — M109 Gout, unspecified: Secondary | ICD-10-CM | POA: Insufficient documentation

## 2015-02-17 DIAGNOSIS — I2510 Atherosclerotic heart disease of native coronary artery without angina pectoris: Secondary | ICD-10-CM | POA: Diagnosis present

## 2015-02-17 DIAGNOSIS — E785 Hyperlipidemia, unspecified: Secondary | ICD-10-CM | POA: Diagnosis not present

## 2015-02-17 DIAGNOSIS — J449 Chronic obstructive pulmonary disease, unspecified: Secondary | ICD-10-CM | POA: Diagnosis not present

## 2015-02-17 DIAGNOSIS — M25512 Pain in left shoulder: Secondary | ICD-10-CM | POA: Diagnosis not present

## 2015-02-17 DIAGNOSIS — G4733 Obstructive sleep apnea (adult) (pediatric): Secondary | ICD-10-CM | POA: Diagnosis not present

## 2015-02-17 DIAGNOSIS — E1159 Type 2 diabetes mellitus with other circulatory complications: Secondary | ICD-10-CM | POA: Insufficient documentation

## 2015-02-17 DIAGNOSIS — Z6831 Body mass index (BMI) 31.0-31.9, adult: Secondary | ICD-10-CM | POA: Diagnosis not present

## 2015-02-17 HISTORY — DX: Pain in unspecified shoulder: M25.519

## 2015-02-17 HISTORY — DX: Paresthesia of skin: R20.2

## 2015-03-05 DIAGNOSIS — L918 Other hypertrophic disorders of the skin: Secondary | ICD-10-CM | POA: Diagnosis not present

## 2015-03-05 DIAGNOSIS — Z1283 Encounter for screening for malignant neoplasm of skin: Secondary | ICD-10-CM | POA: Diagnosis not present

## 2015-06-01 DIAGNOSIS — Z6831 Body mass index (BMI) 31.0-31.9, adult: Secondary | ICD-10-CM | POA: Diagnosis not present

## 2015-06-01 DIAGNOSIS — E784 Other hyperlipidemia: Secondary | ICD-10-CM | POA: Diagnosis not present

## 2015-06-01 DIAGNOSIS — Z125 Encounter for screening for malignant neoplasm of prostate: Secondary | ICD-10-CM | POA: Diagnosis not present

## 2015-06-01 DIAGNOSIS — I10 Essential (primary) hypertension: Secondary | ICD-10-CM | POA: Diagnosis not present

## 2015-06-01 DIAGNOSIS — M109 Gout, unspecified: Secondary | ICD-10-CM | POA: Diagnosis not present

## 2015-06-08 DIAGNOSIS — G4733 Obstructive sleep apnea (adult) (pediatric): Secondary | ICD-10-CM | POA: Diagnosis not present

## 2015-06-08 DIAGNOSIS — M25512 Pain in left shoulder: Secondary | ICD-10-CM | POA: Diagnosis not present

## 2015-06-08 DIAGNOSIS — J449 Chronic obstructive pulmonary disease, unspecified: Secondary | ICD-10-CM | POA: Diagnosis not present

## 2015-06-08 DIAGNOSIS — Z6831 Body mass index (BMI) 31.0-31.9, adult: Secondary | ICD-10-CM | POA: Diagnosis not present

## 2015-06-08 DIAGNOSIS — R7309 Other abnormal glucose: Secondary | ICD-10-CM | POA: Diagnosis not present

## 2015-06-08 DIAGNOSIS — Z1389 Encounter for screening for other disorder: Secondary | ICD-10-CM | POA: Diagnosis not present

## 2015-06-08 DIAGNOSIS — E784 Other hyperlipidemia: Secondary | ICD-10-CM | POA: Diagnosis not present

## 2015-06-08 DIAGNOSIS — I10 Essential (primary) hypertension: Secondary | ICD-10-CM | POA: Diagnosis not present

## 2015-06-08 DIAGNOSIS — Z9861 Coronary angioplasty status: Secondary | ICD-10-CM | POA: Diagnosis not present

## 2015-06-08 DIAGNOSIS — M109 Gout, unspecified: Secondary | ICD-10-CM | POA: Diagnosis not present

## 2015-06-08 DIAGNOSIS — Z0000 Encounter for general adult medical examination without abnormal findings: Secondary | ICD-10-CM | POA: Diagnosis not present

## 2015-06-16 DIAGNOSIS — M19012 Primary osteoarthritis, left shoulder: Secondary | ICD-10-CM | POA: Diagnosis not present

## 2015-08-30 DIAGNOSIS — H2513 Age-related nuclear cataract, bilateral: Secondary | ICD-10-CM | POA: Diagnosis not present

## 2015-09-10 DIAGNOSIS — R05 Cough: Secondary | ICD-10-CM | POA: Diagnosis not present

## 2015-09-10 DIAGNOSIS — M109 Gout, unspecified: Secondary | ICD-10-CM | POA: Diagnosis not present

## 2015-09-10 DIAGNOSIS — J449 Chronic obstructive pulmonary disease, unspecified: Secondary | ICD-10-CM | POA: Diagnosis not present

## 2015-09-10 DIAGNOSIS — R7309 Other abnormal glucose: Secondary | ICD-10-CM | POA: Diagnosis not present

## 2015-09-10 DIAGNOSIS — Z6830 Body mass index (BMI) 30.0-30.9, adult: Secondary | ICD-10-CM | POA: Diagnosis not present

## 2015-09-10 DIAGNOSIS — I10 Essential (primary) hypertension: Secondary | ICD-10-CM | POA: Diagnosis not present

## 2015-10-05 DIAGNOSIS — H25041 Posterior subcapsular polar age-related cataract, right eye: Secondary | ICD-10-CM | POA: Diagnosis not present

## 2015-10-05 DIAGNOSIS — H2511 Age-related nuclear cataract, right eye: Secondary | ICD-10-CM | POA: Diagnosis not present

## 2015-10-05 DIAGNOSIS — H25011 Cortical age-related cataract, right eye: Secondary | ICD-10-CM | POA: Diagnosis not present

## 2015-10-05 DIAGNOSIS — H5211 Myopia, right eye: Secondary | ICD-10-CM | POA: Diagnosis not present

## 2015-10-06 DIAGNOSIS — H2512 Age-related nuclear cataract, left eye: Secondary | ICD-10-CM | POA: Diagnosis not present

## 2015-10-12 DIAGNOSIS — H21562 Pupillary abnormality, left eye: Secondary | ICD-10-CM | POA: Diagnosis not present

## 2015-10-12 DIAGNOSIS — H2512 Age-related nuclear cataract, left eye: Secondary | ICD-10-CM | POA: Diagnosis not present

## 2015-10-12 DIAGNOSIS — H25042 Posterior subcapsular polar age-related cataract, left eye: Secondary | ICD-10-CM | POA: Diagnosis not present

## 2015-10-12 DIAGNOSIS — H52202 Unspecified astigmatism, left eye: Secondary | ICD-10-CM | POA: Diagnosis not present

## 2015-10-12 DIAGNOSIS — H25012 Cortical age-related cataract, left eye: Secondary | ICD-10-CM | POA: Diagnosis not present

## 2016-02-08 DIAGNOSIS — Z6830 Body mass index (BMI) 30.0-30.9, adult: Secondary | ICD-10-CM | POA: Diagnosis not present

## 2016-02-08 DIAGNOSIS — I10 Essential (primary) hypertension: Secondary | ICD-10-CM | POA: Diagnosis not present

## 2016-02-08 DIAGNOSIS — Z9861 Coronary angioplasty status: Secondary | ICD-10-CM | POA: Diagnosis not present

## 2016-02-08 DIAGNOSIS — R42 Dizziness and giddiness: Secondary | ICD-10-CM | POA: Diagnosis not present

## 2016-02-08 DIAGNOSIS — J449 Chronic obstructive pulmonary disease, unspecified: Secondary | ICD-10-CM | POA: Diagnosis not present

## 2016-02-08 DIAGNOSIS — G4733 Obstructive sleep apnea (adult) (pediatric): Secondary | ICD-10-CM | POA: Diagnosis not present

## 2016-02-08 DIAGNOSIS — Z23 Encounter for immunization: Secondary | ICD-10-CM | POA: Diagnosis not present

## 2016-03-02 ENCOUNTER — Encounter: Payer: Self-pay | Admitting: Neurology

## 2016-03-02 ENCOUNTER — Ambulatory Visit (INDEPENDENT_AMBULATORY_CARE_PROVIDER_SITE_OTHER): Payer: Medicare Other | Admitting: Neurology

## 2016-03-02 VITALS — BP 165/95 | HR 92 | Resp 20 | Ht 65.0 in | Wt 183.0 lb

## 2016-03-02 DIAGNOSIS — Z9981 Dependence on supplemental oxygen: Secondary | ICD-10-CM | POA: Diagnosis not present

## 2016-03-02 DIAGNOSIS — I2510 Atherosclerotic heart disease of native coronary artery without angina pectoris: Secondary | ICD-10-CM

## 2016-03-02 DIAGNOSIS — I10 Essential (primary) hypertension: Secondary | ICD-10-CM | POA: Diagnosis not present

## 2016-03-02 DIAGNOSIS — R0600 Dyspnea, unspecified: Secondary | ICD-10-CM

## 2016-03-02 DIAGNOSIS — K219 Gastro-esophageal reflux disease without esophagitis: Secondary | ICD-10-CM | POA: Diagnosis not present

## 2016-03-02 DIAGNOSIS — G4733 Obstructive sleep apnea (adult) (pediatric): Secondary | ICD-10-CM | POA: Diagnosis not present

## 2016-03-02 DIAGNOSIS — R0902 Hypoxemia: Secondary | ICD-10-CM | POA: Insufficient documentation

## 2016-03-02 DIAGNOSIS — F17210 Nicotine dependence, cigarettes, uncomplicated: Secondary | ICD-10-CM

## 2016-03-02 DIAGNOSIS — R0609 Other forms of dyspnea: Secondary | ICD-10-CM

## 2016-03-02 DIAGNOSIS — J449 Chronic obstructive pulmonary disease, unspecified: Secondary | ICD-10-CM | POA: Insufficient documentation

## 2016-03-02 DIAGNOSIS — J441 Chronic obstructive pulmonary disease with (acute) exacerbation: Secondary | ICD-10-CM | POA: Insufficient documentation

## 2016-03-02 NOTE — Patient Instructions (Signed)

## 2016-03-02 NOTE — Progress Notes (Signed)
SLEEP MEDICINE CLINIC   Provider:  Larey Seat, M D  Referring Provider: Leanna Battles, MD Primary Care Physician:  Donnajean Lopes, MD  Chief Complaint  Patient presents with  . New Patient (Initial Visit)    can't sleep, uses nocturnal o2    HPI:  Paul [name] is a 65 y.o. male , seen here as a referral  from Dr. Philip Aspen for a sleep consultation,  Mrs. Gililland reports that well over 10 years ago he underwent a sleep study at  [facility],  which must have resulted in the diagnosis of obstructive sleep apnea as he was asked to start using CPAP. He could not tolerate CPAP he stated and as alternative therapy oxygen was ordered. Apparently, he has been on oxygen ever since but he does not feel that this improves his sleep pattern, his  sleep quality. He continues to be a daily smoker has smoked for well over 50 years, has COPD as well as always a, he drinks 3-4 caffeinated beverages a day, does drink hard liquor but not daily. He has very high blood pressures which were confirmed and his last primary care visit with Dr. Bevelyn Buckles on 02/08/2016, when e presented with 192/112 blood pressure. At the same visit he reported balance problems, and had fallen the night before.  He was diagnosed with benign positional vertigo, but also esophageal reflux disease, hyperlipidemia, chronic insomnia with noncompliance with CPAP, untreated obstructive sleep apnea, cervical spine degenerative disc disease, chronic pain of the left shoulder, gout, obesity, hypertension, coronary artery disease with a heart attack in 2014, followed by Dr. Christinia Gully- pulmonary,  Glenetta Hew ,cardiology -[facility], neurosurgery.  Chief complaint according to patient : "I cannot sleep , I cough"   Sleep habits are as follows: The patient endorses a usual bedtime at 11 PM, the bedroom is dark, cool and quiet. He usually sleeps on his back most of the time, props up on one pillow.    He reports he cannot sleep in a recliner in a seated position. He sleeps alone. He has been told many times that he snores loudly and that he stops breathing in his sleep. He is up to 3 times waking for bathroom breaks each night, the last one at 5 or 6 AM. He does not report vivid dreams, nightmares, sleepwalking, night terrors, enuresis, sleep talking or sleep eating. He usually arises around 7 AM, his entrained pattern from his working days was to rise at 4 AM to get ready for work. He is now retired 5 years and his sleep pattern has changed. He wakes up spontaneously around 7, takes breakfast at home. He takes naps on week ends, 15 minutes only.   Sleep medical history and family sleep history:  No tonsil or adenoid ectomy, anterior fusion of the neck with Dr. Luiz Ochoa in 2010, no history of traumatic brain injuries, concussions or contusions. No psychiatric history. Remarkable is his family history one brother expired at age 98 after myocardial infarction and sepsis, 2 sisters one healthy 1 has COPD coronary artery disease and strokes, he also has 2 children both are healthy.   Social history:  Divorced father of 2, See above, the patient is retired, used to work early shift. 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 He drinks vodka, does at social occasions, he drinks a couple of coffees in the  morning, but more than 3 drinks of caffeinated sodas daily.   Review of Systems: Out of a complete 14 system review, the patient complains of only the following symptoms, and all other reviewed systems are negative.   Epworth score 6 , Fatigue severity score n/a  , depression score 4/15    Social History   Social History  . Marital status: Divorced    Spouse name: N/A  . Number of children: N/A  . Years of education: N/A   Occupational History  . Not on file.   Social History Main Topics   . Smoking status: Current Some Day Smoker    Packs/day: 1.00    Years: 40.00    Types: Cigarettes  . Smokeless tobacco: Never Used     Comment: 02/18/14- smokes occ cig maybe 3 x per wk  . Alcohol use 0.0 oz/week     Comment: social 2-3 drink a week  . Drug use: No  . Sexual activity: Not Currently   Other Topics Concern  . Not on file   Social History Narrative   He is a 65 y.o. divorced father of 54, grandfather 1.   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   He quit smoking in August 2013, after smoking a pack a day for roughly 40 years.   He drinks socially 2-3 drinks a week only.   He does not get routine exercise, mostly due to 2 fatigue and dyspnea. Otherwise been relatively sedentary.    Family History  Problem Relation Age of Onset  . Heart disease Sister   . Heart attack Brother   . Emphysema Father     smoked  . Aneurysm Father     Past Medical History:  Diagnosis Date  . CAD S/P percutaneous coronary angioplasty -- D1, Xience Xpedition DES 2.5 mm x 33 mm 11/24/2011   a) Mild-to-moderate 30-40% lesions in the RCA, LAD and Circumflex. b) CULPRIT LESION: long tubular 70-80% lesion in D1 with FFR of 0.7 --> PCI w/ Xience Xpedition DES 2.5 mm x 30 mm (2.65 MM); c) Lexiscan Myoview 11/2013: No Ischemia or Infarct (Inferior Gut Attenuation) EF 63%.  Marland Kitchen COPD (chronic obstructive pulmonary disease) ([place])    "don't have full case of it; I'm right there at it"  . Essential hypertension 11/24/2011  . Exertional dyspnea, chronic   . Gout   . History of Unstable angina 11/24/2011   Referred for cardiac catheterization  . Hyperlipidemia with target LDL less than 70 11/24/2011  . Obesity (BMI 30.0-34.9)   . OSA (obstructive sleep apnea), uses oxygen at home did not tolerate cpap 11/24/2011    Past Surgical History:  Procedure Laterality Date  .  CERVICAL SPINE SURGERY  2012  . CORONARY ANGIOPLASTY WITH STENT PLACEMENT  11/23/2011   "1; first one"  . LEFT HEART CATHETERIZATION WITH CORONARY ANGIOGRAM N/A 11/23/2011   Procedure: LEFT HEART CATHETERIZATION WITH CORONARY ANGIOGRAM;  Surgeon: Leonie Man, MD;  Location[facility] CATH LAB;  Service: Cardiovascular;  Laterality: N/A;    Current Outpatient Prescriptions  Medication Sig Dispense Refill  . albuterol (PROAIR HFA) 108 (90 BASE) MCG/ACT inhaler Inhale 2 puffs into the lungs every 6 (six) hours as needed for wheezing or shortness of breath.    Marland Kitchen albuterol (PROVENTIL) (2.5 MG/3ML) 0.083% nebulizer solution Take 2.5 mg by nebulization every 6 (six) hours as needed for  wheezing or shortness of breath.    . allopurinol (ZYLOPRIM) 300 MG tablet Take 300 mg by mouth daily.    Marland Kitchen amLODipine (NORVASC) 5 MG tablet Take 5 mg by mouth daily.    Marland Kitchen aspirin 325 MG tablet Take 325 mg by mouth daily.    Marland Kitchen atorvastatin (LIPITOR) 20 MG tablet Take 1 tablet (20 mg total) by mouth daily at 6 PM. 90 tablet 1  . budesonide-formoterol (SYMBICORT) 160-4.5 MCG/ACT inhaler Take 2 puffs first thing in am and then another 2 puffs about 12 hours later. 1 Inhaler 11  . clopidogrel (PLAVIX) 75 MG tablet Take 1 tablet (75 mg total) by mouth daily. 90 tablet 1  . colchicine (COLCRYS) 0.6 MG tablet Take two tabs by mouth at once then a third tablet one hour later. 3 tablet 0  . cyclobenzaprine (FLEXERIL) 10 MG tablet Take 10 mg by mouth daily.    . meclizine (ANTIVERT) 25 MG tablet Take 25 mg by mouth daily.    . meloxicam (MOBIC) 15 MG tablet Take 15 mg by mouth daily.    . metoprolol tartrate (LOPRESSOR) 25 MG tablet Take 25 mg by mouth daily.    . nortriptyline (PAMELOR) 25 MG capsule Take 25 mg by mouth at bedtime.    Marland Kitchen omeprazole (PRILOSEC) 20 MG capsule Take 20 mg by mouth daily.    Marland Kitchen oxyCODONE-acetaminophen (PERCOCET/ROXICET) 5-325 MG per tablet Take 1 tablet by mouth every 6 (six) hours as needed.  0  . traMADol  (ULTRAM) 50 MG tablet Take 1 tablet (50 mg total) by mouth every 6 (six) hours as needed for moderate pain or severe pain. 15 tablet 0  . zolpidem (AMBIEN) 10 MG tablet Take 10 mg by mouth at bedtime.    . nitroGLYCERIN (NITROSTAT) 0.4 MG SL tablet Place 1 tablet (0.4 mg total) under the tongue every 5 (five) minutes as needed for chest pain. 25 tablet 4   No current facility-administered medications for this visit.     Allergies as of 03/02/2016 - Review Complete 03/02/2016  Allergen Reaction Noted  . Codeine Other (See Comments) 11/22/2011  . Duloxetine hcl  12/04/2013    Vitals: BP (!) 165/95   Pulse 92   Resp 20   Ht 5\' 5"  (1.651 m)   Wt 183 lb (83 kg)   BMI 30.45 kg/m  Last Weight:  Wt Readings from Last 1 Encounters:  03/02/16 183 lb (83 kg)   [id number] mass index is 30.45 kg/m.     Last Height:   Ht Readings from Last 1 Encounters:  03/02/16 5\' 5"  (1.651 m)    Physical exam:  General: The patient is awake, alert and appears not in acute distress.   Head: Normocephalic, atraumatic. Neck is supple. Mallampati  3,  neck circumference: 16.5 . Nasal airflow congested ,  Retrognathia is not seen. Full dentures.  Cardiovascular:  Regular rate and rhythm , without  murmurs or carotid bruit, and without distended neck veins. Respiratory: wheezing , rhonchi. Skin:  Spider navi, facial erythema.  Trunk: The patient's posture is erect .  Neurologic exam : The patient is awake and alert, oriented to place and time.  Attention span & concentration ability appears normal.  Speech is fluent,  without  dysarthria, dysphonia or aphasia.  Mood and affect are appropriate.  Cranial nerves: Pupils are equal and briskly reactive to light. Funduscopic exam without evidence of pallor or edema.  Extraocular movements  in vertical and horizontal planes intact and without  nystagmus. Visual fields by finger perimetry are intact. Hearing to finger rub intact.   Facial sensation intact to  fine touch.  Facial motor strength is symmetric and tongue and uvula move midline. Shoulder shrug was symmetrical.   Motor exam:   Normal tone, muscle bulk and symmetric strength in all extremities.  His left hip hurts and he described pain in the hamstrings- sciatica.  He reported subjective loss of grip strength, but preserved pinch strengths.  Sensory:  Fine touch, pinprick and vibration were normal.  Coordination:  Finger-to-nose maneuver normal without evidence of ataxia, dysmetria or tremor. He noted changes in handwriting, it is not as neat, he has arthritic hand pain making it harder for him to hold a pen .  Gait and station: Patient walks without assistive device and is able unassisted to climb up to the exam table. The patient limps and does not carry the full weight on his left hip due to pain, he describes sciatica radiating from the gluteus to the hamstrings, no ataxia here.   Deep tendon reflexes: in the  upper and lower extremities are symmetric and intact. Babinski maneuver response is  downgoing.  The patient was advised of the nature of the diagnosed sleep disorder , the treatment options and risks for general a health and wellness arising from not treating the condition.  I spent more than  45 minutes of face to face time with the patient. Greater than 50% of time was spent in counseling and coordination of care. We have discussed the diagnosis and differential and I answered the patient's questions.     Assessment:  After physical and neurologic examination, review of laboratory studies,  Personal review of imaging studies, reports of other /same  Imaging studies ,  Results of polysomnography/ neurophysiology testing and pre-existing records as far as provided in visit., my assessment is   1) Paul [name] has known obstructive sleep apnea which is at the same untreated, overlapping with COPD. He was placed on oxygen only but no longer finds that this allows him to have refreshing  and restorative sleep. I suspect that he may still have significant hypoxemia, hypercapnia and will need to be reevaluated in an  attended sleep study, split-night protocol with possible capnography. We can titrate to oxygen should the patient still not tolerate CPAP or should CPAP fail  to raise the nadir /oxygen saturation.  2) in addition to his ongoing tobacco use, frequent bronchitic inflammation, coughing. He cannot afford Chantix, had tried nicotine patches without success.  3) he has  coronary artery disease, has suffered an MI in the past, other comorbidities that make an attended sleep study necessary are - COPD, oxygen dependency, CAD, Nocturia.      Plan:  SPLIT with desensitization for mask fit, by Justin Mend,  Co2 measures.  Titrate oxygen if indicated.      Asencion Partridge Avanni Turnbaugh MD  03/02/2016   CC: Leanna Battles, [place],  [id number]

## 2016-03-21 ENCOUNTER — Ambulatory Visit (INDEPENDENT_AMBULATORY_CARE_PROVIDER_SITE_OTHER): Payer: Medicare Other | Admitting: Neurology

## 2016-03-21 DIAGNOSIS — G4733 Obstructive sleep apnea (adult) (pediatric): Secondary | ICD-10-CM | POA: Diagnosis not present

## 2016-03-21 DIAGNOSIS — R0902 Hypoxemia: Secondary | ICD-10-CM

## 2016-03-21 DIAGNOSIS — K219 Gastro-esophageal reflux disease without esophagitis: Secondary | ICD-10-CM

## 2016-03-21 DIAGNOSIS — I10 Essential (primary) hypertension: Secondary | ICD-10-CM

## 2016-03-21 DIAGNOSIS — J441 Chronic obstructive pulmonary disease with (acute) exacerbation: Secondary | ICD-10-CM

## 2016-03-21 DIAGNOSIS — Z9981 Dependence on supplemental oxygen: Secondary | ICD-10-CM

## 2016-03-21 DIAGNOSIS — I2510 Atherosclerotic heart disease of native coronary artery without angina pectoris: Secondary | ICD-10-CM

## 2016-03-21 DIAGNOSIS — J449 Chronic obstructive pulmonary disease, unspecified: Secondary | ICD-10-CM

## 2016-04-03 ENCOUNTER — Telehealth: Payer: Self-pay | Admitting: Neurology

## 2016-04-03 DIAGNOSIS — R0902 Hypoxemia: Secondary | ICD-10-CM

## 2016-04-03 DIAGNOSIS — G4733 Obstructive sleep apnea (adult) (pediatric): Secondary | ICD-10-CM

## 2016-04-03 DIAGNOSIS — J449 Chronic obstructive pulmonary disease, unspecified: Secondary | ICD-10-CM

## 2016-04-03 NOTE — Procedures (Signed)
PATIENT'S NAME:  Paul [name], Paul [name] DOB:      02/03/1951      MR#:    [id number]     DATE OF RECORDING: 03/21/2016 REFERRING M.D.:  Leanna Battles MD Study Performed:   Baseline Polysomnogram HISTORY:  Mrs. Mcclurg reports that well over 10 years ago he underwent a sleep study at [facility], which must have resulted in the diagnosis of obstructive sleep apnea as he was asked to start using CPAP. He could not tolerate CPAP and oxygen was ordered as alternative therapy. He has been on oxygen ever since but he does not feel that this improves his sleep pattern, his sleep quality. He continues to be a daily smoker, has COPD, drinks 3-4 caffeinated beverages a day, and has very high blood pressures. At the same visit he reported balance problems, and had fallen the night before.  CAD, COPD, hypertension, dyspnea, gout, hyperlipidemia, obesity and OSA The patient endorsed the Epworth Sleepiness Scale at 6/24 points.  The patient's weight 183 pounds with a height of 65 (inches), resulting in a BMI of 30.5 kg/m2.The patient's neck circumference measured 16.5 inches.  CURRENT MEDICATIONS: Albuterol, Allopurinol, Amlodipine, Aspirin, Atorvastatin, Budesonide, Plavix, Colchicine, Cyclobenzaprine, Meclizine, Meloxicam, Metoprolol, Nortriptyline, Omeprazole, Oxycodone, Tramadol, Zolpidem and Nitroglycerin   PROCEDURE:  This is a multichannel digital polysomnogram utilizing the Somnostar 11.2 system.  Electrodes and sensors were applied and monitored per AASM Specifications.   EEG, EOG, Chin and Limb EMG, were sampled at 200 Hz.  ECG, Snore and Nasal Pressure, Thermal Airflow, Respiratory Effort, CPAP Flow and Pressure, Oximetry was sampled at 50 Hz. Digital video and audio were recorded.      BASELINE STUDY   Lights Out was at 22:13 and Lights On at 05:00.  Total recording time (TRT) was 407.5 minutes, with a total sleep time (TST) of 357 minutes.   The patient's sleep latency was 15 minutes.  REM  latency was 107 minutes.  The sleep efficiency was 84.7 %.     SLEEP ARCHITECTURE: WASO (Wake after sleep onset) was 41.5 minutes.  There were 19.5 minutes in Stage N1, 256.5 minutes Stage N2, 0 minutes Stage N3 and 81 minutes in Stage REM.  The percentage of Stage N1 was 5.9%, Stage N2 was 75.6%, Stage N3 was 0% and Stage R (REM sleep) was 18.4%.   RESPIRATORY ANALYSIS:  There were a total of 55 respiratory events:  25 obstructive apneas, 0 central apneas and 30 hypopneas with 0 respiratory event related arousals (RERAs).     The total APNEA/HYPOPNEA INDEX (AHI) was 13.1/hour and the total RESPIRATORY DISTURBANCE INDEX was 13.1 /hour.  48 events occurred in REM sleep and 10 events in NREM. The REM AHI was 61.9 /hour, versus a non-REM AHI of 2.. The patient spent all minutes of total sleep time in the non -supine position (357 minutes). The supine AHI was 0.0 versus a non-supine AHI of 13.0.  OXYGEN SATURATION & C02:  The Wake baseline 02 saturation was 88%, with the lowest being 79%. Time spent below 89% saturation equaled 249 minutes.  Marland Kitchen   PERIODIC LIMB MOVEMENTS:   The patient had a total of 16 Periodic Limb Movements.  The Periodic Limb Movement (PLM) index was 3.8 and the PLM Arousal index was 0 .7/ hour.  Audio and video analysis did not show any abnormal or unusual movements, behaviors, phonations or vocalizations.  The patient took one bathroom break. Snoring was noted. EKG was in keeping with normal sinus rhythm (  NSR).  IMPRESSION:  1. Obstructive Sleep Apnea (OSA) with strong REM AHI over 60/hr.  2. Pronounced and prolonged hypoxemia, 249 minutes.Nadir was 79%. 3. NO Periodic Limb Movement Disorder (PLMD) 4. Mild moderate primary Snoring  RECOMMENDATIONS:  1. Advise full-night, attended, CPAP titration study to optimize therapy. This patient will likely need 1 liter of c oxygen and CPAP titration.   2. Note that patients with congestive heart failure (CHF), significant lung  disease such as chronic obstructive pulmonary disease (COPD), patients expected to have nocturnal arterial oxyhemoglobin desaturation due to conditions other than OSA (e.g. obesity hypoventilation syndrome), patients who do not snore (either naturally or as a result of palate surgery), and patients who have central sleep apnea syndromes are not currently candidates for APAP titration or treatment.   3. Avoid sedative-hypnotics which may worsen sleep apnea, and avoid alcohol and tobacco (as applicable). 4. Advise to lose weight, diet and exercise if not contraindicated (BMI 30.5). 5. Further information regarding OSA may be obtained from [employer] (www.sleepfoundation.org) or American Sleep Apnea Association (www.sleepapnea.org). 6. Advise to continue nocturnal O2 supplement at 1lpm. 7. A follow up appointment will be scheduled in the Sleep Clinic at [facility]. The referring provider will be notified of the results.      I certify that I have reviewed the entire raw data recording prior to the issuance of this report in accordance with the Standards of Accreditation of the American Academy of Sleep Medicine (AASM)    Larey Seat, MD  04-01-2016 Diplomat, American Board of Psychiatry and Neurology  Diplomat, American Board of [place] Director, [facility] [employer]

## 2016-04-04 NOTE — Telephone Encounter (Signed)
I spoke to patient and he is aware of results and recommendations. He is willing to do titration study but is concerned about the cost. I have sent report to PCP.

## 2016-04-04 NOTE — Telephone Encounter (Signed)
-----   Message from Larey Seat, MD sent at 04/03/2016  8:15 AM EST ----- 1. Obstructive Sleep Apnea (OSA) with strong REM AHI over 60/hr.  2. Pronounced and prolonged hypoxemia, 249 minutes.Nadir was 79%. 3. NO Periodic Limb Movement Disorder (PLMD) 4. Mild moderate primary Snoring  RECOMMENDATIONS:  1. Advise full-night, attended, CPAP titration study to optimize therapy. This patient will likely need 1 liter of  nocturnal oxygen after CPAP titration.

## 2016-04-25 ENCOUNTER — Ambulatory Visit (INDEPENDENT_AMBULATORY_CARE_PROVIDER_SITE_OTHER): Payer: Medicare Other | Admitting: Neurology

## 2016-04-25 DIAGNOSIS — G4733 Obstructive sleep apnea (adult) (pediatric): Secondary | ICD-10-CM | POA: Diagnosis not present

## 2016-04-25 DIAGNOSIS — J449 Chronic obstructive pulmonary disease, unspecified: Secondary | ICD-10-CM

## 2016-04-25 DIAGNOSIS — R0902 Hypoxemia: Secondary | ICD-10-CM

## 2016-05-02 ENCOUNTER — Telehealth: Payer: Self-pay | Admitting: Neurology

## 2016-05-02 DIAGNOSIS — J449 Chronic obstructive pulmonary disease, unspecified: Secondary | ICD-10-CM

## 2016-05-02 DIAGNOSIS — J439 Emphysema, unspecified: Secondary | ICD-10-CM

## 2016-05-02 DIAGNOSIS — G4733 Obstructive sleep apnea (adult) (pediatric): Secondary | ICD-10-CM

## 2016-05-02 DIAGNOSIS — G4736 Sleep related hypoventilation in conditions classified elsewhere: Secondary | ICD-10-CM

## 2016-05-02 NOTE — Telephone Encounter (Signed)
  1. Obstructive Sleep Apnea with prolonged, severe hypoxemia was reacting positively to CPAP titration and supplemental oxygen at 1 liter/min.  2. Nasal congestion. 3. Reduced sleep efficiency, which could be secondary to sleeping in laboratory environment, medications, anxiety or discomfort/ pain.  PLANS/RECOMMENDATIONS: CPAP at 9 cm water pressure with 1 liter of oxygen/min. and heated humidity. FFM Airtouch F 20 in large size. Use CPAP 4 hours or more each night to be compliant. Call DME for technical difficulties.   A follow up appointment will be scheduled in the Sleep Clinic at [facility].   Please call [phone] with any questions.

## 2016-05-02 NOTE — Telephone Encounter (Signed)
-----   Message from Larey Seat, MD sent at 05/02/2016 12:55 PM EST -----  1. Obstructive Sleep Apnea with prolonged, severe hypoxemia was reacting positively to CPAP titration and supplemental oxygen at 1 liter/min.  2. Nasal congestion. 3. Reduced sleep efficiency, which could be secondary to sleeping in laboratory environment, medications, anxiety or discomfort/ pain.  PLANS/RECOMMENDATIONS: CPAP at 9 cm water pressure with 1 liter of oxygen/min. and heated humidity. FFM Airtouch F 20 in large size. Use CPAP 4 hours or more each night to be compliant. Call DME for technical difficulties.   A follow up appointment will be scheduled in the Sleep Clinic at [facility].   Please call [phone] with any questions.

## 2016-05-02 NOTE — Telephone Encounter (Signed)
I called pt. I advised him that his sleep study showed osa with severe prolonged hypoxemia that reacted positively to cpap titration and supplemental oxygen. Some nasal congestion was noted along with a reduced sleep efficiency. Dr. Brett Fairy recommends that pt start a cpap. Pt is agreeable to this. I reviewed cpap compliance with pt. I advised him that a DME, [employer], will be reaching out to the pt within a week to get his cpap set up. Pt is agreeable to a follow up appt for 07/10/16 at 9:30am. Pt verbalized understanding of results. Pt had no questions at this time but was encouraged to call back if questions arise.

## 2016-05-02 NOTE — Procedures (Signed)
PATIENT'S NAME:  Paul [name], Paul [name] DOB:      06-01-1950      MR#:    [id number]     DATE OF RECORDING: 04/25/2016 REFERRING M.D.:  Leanna Battles MD Study Performed:   CPAP  Titration HISTORY:  The patient had a sleep study with [place] on 03/21/2016 resulting in an  APNEA/HYPOPNEA INDEX (AHI) of 13.1/hour with the SpO2 nadir being 79%, total desaturation time for this  patient with chronically obstructed nasal passages was 249 minutes, he was snoring loudly.   Mrs. Pupillo reports that well over 10 years ago he underwent a sleep study at [facility], which must have resulted in the diagnosis of obstructive sleep apnea as he was asked to start using CPAP. He could not tolerate CPAP and only oxygen was ordered as alternative therapy. He has been  on oxygen ever since but he does not feel that this improves his sleep pattern, his sleep quality. He continues to be a daily smoker, has COPD, drinks 3-4 caffeinated beverages a day, and has very high blood pressures. At the same visit he reported balance problems, and had fallen  the night before. CAD, COPD, hypertension, dyspnea, gout, hyperlipidemia, obesity  and OSA The patient endorsed the Epworth Sleepiness Scale at 6/24 points.  The patient's weight 183 pounds with a height of 65 (inches),  resulting in a BMI of 30.5 kg/m2.The patient's neck circumference  measured 16.5 inches.  CURRENT MEDICATIONS: Albuterol, Allopurinol, Amlodipine, Aspirin, Atorvastatin, Budesonide, Clipidofrel, Colchicine, Cyclobenzaprine, Meclizine, Meloxicam, Metoprolol, Nortriptyline, Omeprazole, Oxycodone, Tramadol, Zolpidem and Nitroglycerin    PROCEDURE:  This is a multichannel digital polysomnogram utilizing the SomnoStar 11.2 system.  Electrodes and sensors were applied and monitored per AASM Specifications.   EEG, EOG, Chin and Limb EMG, were sampled at 200 Hz.  ECG, Snore and Nasal Pressure, Thermal Airflow, Respiratory Effort, CPAP Flow and Pressure,  Oximetry was sampled at 50 Hz. Digital video and audio were recorded.       CPAP was initiated at 5 cmH20 with 1 liter 02 an  heated humidity per AASM split night standards. The pressure was advanced to 9 cmH20 due of hypopneas, apneas and desaturations.  At a PAP pressure of 9 cmH20 and 1 liter 02/min., there was a reduction of the AHI to 0.0 with improvement of the above symptoms of obstructive sleep apnea.  The oxygen nadir rose to 94%.  Lights Out was at 22:20 and Lights On at 06:18. Total recording time (TRT) was 479 minutes, with a total sleep time (TST) of 319.5 minutes. The patient's sleep latency was 27.5 minutes . REM latency was 270 minutes.  The sleep efficiency was 66.7 %.   WASO (Wake after sleep onset) was 132 minutes.  There were 12.5 minutes in Stage N1, 165.5 minutes Stage N2, 54 minutes Stage N3 and 87.5 minutes in Stage REM.  The percentage of Stage N1 was 3.9%, Stage N2 was 51.8%, Stage N3 was 16.9% and Stage R (REM sleep) was 27.4%.   RESPIRATORY ANALYSIS:  There were 9 respiratory events: 3 obstructive apneas, 0 central apneas and 6 hypopneas with 0 respiratory event related arousals (RERAs).     The total APNEA/HYPOPNEA INDEX (AHI) was 1.7 /hour and the total RESPIRATORY DISTURBANCE INDEX was 1.7/ hr.  7 events occurred in REM sleep and 2 events in NREM. The REM AHI was 4.8 /hour versus a non-REM AHI of 0.5 /hour. The patient spent the total sleep time in the non supine position (320  minutes in non-supine).   OXYGEN SATURATION & C02:  The baseline 02 saturation was 94%, with the lowest being 89%. Time spent below 89% saturation equaled 0 minutes.  PERIODIC LIMB MOVEMENTS:    The patient had a total of 0 Periodic Limb Movements.  The arousals were noted as: 51 were spontaneous, 0 were associated with PLMs, 2 were associated with respiratory events.  Audio and video analysis did not show any abnormal or unusual movements, behaviors, phonations or vocalizations.  EKG was in  keeping with normal sinus rhythm (NSR).The patient took 3 bathroom breaks. Snoring was noted until 7 cm water CPAP was reached. Severe nasal congestion made use of nasal pillow or nasal mask difficult.  The patient was fitted with a -ResMed F20 Airtouch in large size.  DIAGNOSIS 1. Obstructive Sleep Apnea  2. Nasal congestion. 3. Reduced sleep efficiency, which could be secondary to sleeping in laboratory environment, medications, anxiety or discomfort/ pain.  PLANS/RECOMMENDATIONS: CPAP at 9 cm water pressure with 1 liter of oxygen/min. and heated humidity. FFM Airtouch F 20 in large size. Use CPAP 4 hours or more each night to be compliant. Call DME for technical difficulties.   A follow up appointment will be scheduled in the Sleep Clinic at [facility].   Please call [phone] with any questions.     I certify that I have reviewed the entire raw data recording prior to the issuance of this report in accordance with the Standards of Accreditation of the American Academy of Sleep Medicine (AASM)    Larey Seat, M.D.  05-02-2016  Diplomat, American Board of Psychiatry and Neurology  Diplomat, [place] [employer] [facility] Medical Director, [place] [facility] at [facility]

## 2016-05-10 ENCOUNTER — Telehealth: Payer: Self-pay

## 2016-05-10 DIAGNOSIS — Z9989 Dependence on other enabling machines and devices: Principal | ICD-10-CM

## 2016-05-10 DIAGNOSIS — R0902 Hypoxemia: Secondary | ICD-10-CM

## 2016-05-10 DIAGNOSIS — G4733 Obstructive sleep apnea (adult) (pediatric): Secondary | ICD-10-CM

## 2016-05-10 NOTE — Telephone Encounter (Signed)
Spoke with Jeneen Rinks from [employer]. Patient does not qualify for 02. His cpap was started at 5 with 1lpm bled in per order. We have to prove cpap was at optimal pressure then if 02 sats are low we bleed in 02. Can we order an O/N oximetry to see if he needs it with cpap.

## 2016-05-10 NOTE — Telephone Encounter (Signed)
Per Shirlean Mylar, pt needs to do an ONO on CPAP with no O2 to see if he qualifies for oxygen.

## 2016-05-11 NOTE — Telephone Encounter (Signed)
Order for ONO faxed to [facility].

## 2016-06-09 DIAGNOSIS — Z6830 Body mass index (BMI) 30.0-30.9, adult: Secondary | ICD-10-CM | POA: Diagnosis not present

## 2016-06-09 DIAGNOSIS — M109 Gout, unspecified: Secondary | ICD-10-CM | POA: Diagnosis not present

## 2016-06-09 DIAGNOSIS — G4733 Obstructive sleep apnea (adult) (pediatric): Secondary | ICD-10-CM | POA: Diagnosis not present

## 2016-06-09 DIAGNOSIS — R7309 Other abnormal glucose: Secondary | ICD-10-CM | POA: Diagnosis not present

## 2016-06-09 DIAGNOSIS — E784 Other hyperlipidemia: Secondary | ICD-10-CM | POA: Diagnosis not present

## 2016-06-09 DIAGNOSIS — R7401 Elevation of levels of liver transaminase levels: Secondary | ICD-10-CM | POA: Insufficient documentation

## 2016-06-09 DIAGNOSIS — R7402 Elevation of levels of lactic acid dehydrogenase (LDH): Secondary | ICD-10-CM

## 2016-06-09 DIAGNOSIS — R05 Cough: Secondary | ICD-10-CM | POA: Diagnosis not present

## 2016-06-09 DIAGNOSIS — R1011 Right upper quadrant pain: Secondary | ICD-10-CM | POA: Diagnosis not present

## 2016-06-09 DIAGNOSIS — Z9861 Coronary angioplasty status: Secondary | ICD-10-CM | POA: Diagnosis not present

## 2016-06-09 DIAGNOSIS — Z125 Encounter for screening for malignant neoplasm of prostate: Secondary | ICD-10-CM | POA: Diagnosis not present

## 2016-06-09 DIAGNOSIS — I10 Essential (primary) hypertension: Secondary | ICD-10-CM | POA: Diagnosis not present

## 2016-06-09 HISTORY — DX: Elevation of levels of lactic acid dehydrogenase (LDH): R74.02

## 2016-06-09 HISTORY — DX: Elevation of levels of liver transaminase levels: R74.01

## 2016-06-12 ENCOUNTER — Other Ambulatory Visit: Payer: Self-pay | Admitting: Internal Medicine

## 2016-06-12 DIAGNOSIS — R1011 Right upper quadrant pain: Secondary | ICD-10-CM

## 2016-06-12 DIAGNOSIS — R740 Nonspecific elevation of levels of transaminase and lactic acid dehydrogenase [LDH]: Principal | ICD-10-CM

## 2016-06-12 DIAGNOSIS — R7401 Elevation of levels of liver transaminase levels: Secondary | ICD-10-CM

## 2016-06-14 ENCOUNTER — Telehealth: Payer: Self-pay | Admitting: Neurology

## 2016-06-15 DIAGNOSIS — Z1389 Encounter for screening for other disorder: Secondary | ICD-10-CM | POA: Diagnosis not present

## 2016-06-15 DIAGNOSIS — M25512 Pain in left shoulder: Secondary | ICD-10-CM | POA: Diagnosis not present

## 2016-06-15 DIAGNOSIS — J449 Chronic obstructive pulmonary disease, unspecified: Secondary | ICD-10-CM | POA: Diagnosis not present

## 2016-06-15 DIAGNOSIS — E784 Other hyperlipidemia: Secondary | ICD-10-CM | POA: Diagnosis not present

## 2016-06-15 DIAGNOSIS — G4733 Obstructive sleep apnea (adult) (pediatric): Secondary | ICD-10-CM | POA: Diagnosis not present

## 2016-06-15 DIAGNOSIS — R740 Nonspecific elevation of levels of transaminase and lactic acid dehydrogenase [LDH]: Secondary | ICD-10-CM | POA: Diagnosis not present

## 2016-06-15 DIAGNOSIS — R1011 Right upper quadrant pain: Secondary | ICD-10-CM | POA: Diagnosis not present

## 2016-06-15 DIAGNOSIS — M109 Gout, unspecified: Secondary | ICD-10-CM | POA: Diagnosis not present

## 2016-06-15 DIAGNOSIS — Z0000 Encounter for general adult medical examination without abnormal findings: Secondary | ICD-10-CM | POA: Diagnosis not present

## 2016-06-15 DIAGNOSIS — Z6830 Body mass index (BMI) 30.0-30.9, adult: Secondary | ICD-10-CM | POA: Diagnosis not present

## 2016-06-15 DIAGNOSIS — I10 Essential (primary) hypertension: Secondary | ICD-10-CM | POA: Diagnosis not present

## 2016-06-15 DIAGNOSIS — F329 Major depressive disorder, single episode, unspecified: Secondary | ICD-10-CM | POA: Diagnosis not present

## 2016-06-15 DIAGNOSIS — R7309 Other abnormal glucose: Secondary | ICD-10-CM | POA: Diagnosis not present

## 2016-06-15 NOTE — Telephone Encounter (Signed)
I don't have anything to do with the CPAP machines please forward to nurse

## 2016-06-16 ENCOUNTER — Ambulatory Visit
Admission: RE | Admit: 2016-06-16 | Discharge: 2016-06-16 | Disposition: A | Discharge status: Home / Self Care | Payer: Medicare Other | Source: Ambulatory Visit | Attending: Internal Medicine | Admitting: Internal Medicine

## 2016-06-16 DIAGNOSIS — R7401 Elevation of levels of liver transaminase levels: Secondary | ICD-10-CM

## 2016-06-16 DIAGNOSIS — R1011 Right upper quadrant pain: Secondary | ICD-10-CM

## 2016-06-16 DIAGNOSIS — R740 Nonspecific elevation of levels of transaminase and lactic acid dehydrogenase [LDH]: Principal | ICD-10-CM

## 2016-06-16 DIAGNOSIS — K760 Fatty (change of) liver, not elsewhere classified: Secondary | ICD-10-CM | POA: Diagnosis not present

## 2016-06-19 ENCOUNTER — Other Ambulatory Visit: Payer: Self-pay | Admitting: Internal Medicine

## 2016-06-19 DIAGNOSIS — M25512 Pain in left shoulder: Principal | ICD-10-CM

## 2016-06-19 DIAGNOSIS — G8929 Other chronic pain: Secondary | ICD-10-CM

## 2016-07-04 ENCOUNTER — Telehealth: Payer: Self-pay | Admitting: Neurology

## 2016-07-04 NOTE — Telephone Encounter (Signed)
Patient has an upcoming apt on Monday that he thinks he should cancel as he does not have CPAP. Best call back is [phone]

## 2016-07-05 NOTE — Telephone Encounter (Signed)
I called pt. He says that he was able to get his cpap today. He is agreeable to an appt on 10/10/2016 at 2:30pm. Pt verbalized understanding of new appt date and time.

## 2016-07-05 NOTE — Telephone Encounter (Signed)
I called pt to discuss. No answer, left a message asking him to call me back. I have also reached out to [facility] to discuss this.

## 2016-07-05 NOTE — Telephone Encounter (Signed)
Pt called, I was skyped to take the call, he was lost looking for [facility]'s office. I gave him the address but he is still lost. I called [facility] and they are going to call the pt to help him to get the office.

## 2016-07-10 ENCOUNTER — Telehealth: Payer: Self-pay

## 2016-07-10 ENCOUNTER — Ambulatory Visit: Payer: Self-pay | Admitting: Neurology

## 2016-07-10 NOTE — Telephone Encounter (Signed)
I called pt, advised him that Dr. Brett Fairy reviewed his ONO on CPAP results and wanted him to know that pt does not qualify for additional O2. Pt should continue to use his CPAP every night. Pt verbalized understanding of results. Pt had no questions at this time but was encouraged to call back if questions arise.

## 2016-07-20 ENCOUNTER — Encounter: Payer: Self-pay | Admitting: Internal Medicine

## 2016-07-21 DIAGNOSIS — M19012 Primary osteoarthritis, left shoulder: Secondary | ICD-10-CM | POA: Diagnosis not present

## 2016-07-27 DIAGNOSIS — Z6830 Body mass index (BMI) 30.0-30.9, adult: Secondary | ICD-10-CM | POA: Diagnosis not present

## 2016-07-27 DIAGNOSIS — J449 Chronic obstructive pulmonary disease, unspecified: Secondary | ICD-10-CM | POA: Diagnosis not present

## 2016-07-27 DIAGNOSIS — F329 Major depressive disorder, single episode, unspecified: Secondary | ICD-10-CM | POA: Diagnosis not present

## 2016-07-27 DIAGNOSIS — G4733 Obstructive sleep apnea (adult) (pediatric): Secondary | ICD-10-CM | POA: Diagnosis not present

## 2016-07-27 DIAGNOSIS — I10 Essential (primary) hypertension: Secondary | ICD-10-CM | POA: Diagnosis not present

## 2016-07-27 DIAGNOSIS — R740 Nonspecific elevation of levels of transaminase and lactic acid dehydrogenase [LDH]: Secondary | ICD-10-CM | POA: Diagnosis not present

## 2016-07-27 DIAGNOSIS — R05 Cough: Secondary | ICD-10-CM | POA: Diagnosis not present

## 2016-07-27 DIAGNOSIS — R7309 Other abnormal glucose: Secondary | ICD-10-CM | POA: Diagnosis not present

## 2016-10-08 ENCOUNTER — Encounter: Payer: Self-pay | Admitting: Neurology

## 2016-10-10 ENCOUNTER — Encounter: Payer: Self-pay | Admitting: Neurology

## 2016-10-10 ENCOUNTER — Telehealth: Payer: Self-pay

## 2016-10-10 ENCOUNTER — Ambulatory Visit (INDEPENDENT_AMBULATORY_CARE_PROVIDER_SITE_OTHER): Payer: Medicare Other | Admitting: Neurology

## 2016-10-10 VITALS — BP 151/83 | HR 79 | Resp 20 | Ht 65.0 in | Wt 186.0 lb

## 2016-10-10 DIAGNOSIS — Z9989 Dependence on other enabling machines and devices: Secondary | ICD-10-CM

## 2016-10-10 DIAGNOSIS — G4736 Sleep related hypoventilation in conditions classified elsewhere: Secondary | ICD-10-CM

## 2016-10-10 DIAGNOSIS — R0683 Snoring: Secondary | ICD-10-CM

## 2016-10-10 DIAGNOSIS — J439 Emphysema, unspecified: Principal | ICD-10-CM

## 2016-10-10 DIAGNOSIS — I2510 Atherosclerotic heart disease of native coronary artery without angina pectoris: Secondary | ICD-10-CM

## 2016-10-10 DIAGNOSIS — J449 Chronic obstructive pulmonary disease, unspecified: Secondary | ICD-10-CM

## 2016-10-10 DIAGNOSIS — G4733 Obstructive sleep apnea (adult) (pediatric): Secondary | ICD-10-CM | POA: Diagnosis not present

## 2016-10-10 DIAGNOSIS — J209 Acute bronchitis, unspecified: Secondary | ICD-10-CM

## 2016-10-10 MED ORDER — VARENICLINE TARTRATE 0.5 MG PO TABS: 0.5000 mg | ORAL_TABLET | Freq: Two times a day (BID) | ORAL | 1 refills | Status: DC

## 2016-10-10 NOTE — Telephone Encounter (Signed)
[facility] contacted me and told me that since the ONO on CPAP did not qualify him for O2, they cannot provide him oxygen, due to Medicare requirements. Pt will need to have a cpap titration study that shows qualifying desaturations to get his oxygen approved. Will send to Dr. Brett Fairy for review.

## 2016-10-10 NOTE — Patient Instructions (Addendum)
Sleep Apnea Sleep apnea is a condition that affects breathing. People with sleep apnea have moments during sleep when their breathing pauses briefly or gets shallow. Sleep apnea can cause these symptoms:  Trouble staying asleep.  Sleepiness or tiredness during the day.  Irritability.  Loud snoring.  Morning headaches.  Trouble concentrating.  Forgetting things.  Less interest in sex.  Being sleepy for no reason.  Mood swings.  Personality changes.  Depression.  Waking up a lot during the night to pee (urinate).  Dry mouth.  Sore throat.  Follow these instructions at home:  Make any changes in your routine that your doctor recommends.  Eat a healthy, well-balanced diet.  Take over-the-counter and prescription medicines only as told by your doctor.  Avoid using alcohol, calming medicines (sedatives), and narcotic medicines.  Take steps to lose weight if you are overweight.  If you were given a machine (device) to use while you sleep, use it only as told by your doctor.  Do not use any tobacco products, such as cigarettes, chewing tobacco, and e-cigarettes. If you need help quitting, ask your doctor.  Keep all follow-up visits as told by your doctor. This is important. Contact a doctor if:  The machine that you were given to use during sleep is uncomfortable or does not seem to be working.  Your symptoms do not get better.  Your symptoms get worse. Get help right away if:  Your chest hurts.  You have trouble breathing in enough air (shortness of breath).  You have an uncomfortable feeling in your back, arms, or stomach.  You have trouble talking.  One side of your body feels weak.  A part of your face is hanging down (drooping). These symptoms may be an emergency. Do not wait to see if the symptoms will go away. Get medical help right away. Call your local emergency services (911 in the [place] Do not drive yourself to the hospital. This information  is not intended to replace advice given to you by your health care provider. Make sure you discuss any questions you have with your health care provider. Document Released: 01/11/2008 Document Revised: 11/28/2015 Document Reviewed: 01/11/2015 Elsevier Interactive Patient Education  2018 [employer].  Varenicline oral tablets What is this medicine? VARENICLINE (var EN i kleen) is used to help people quit smoking. It can reduce the symptoms caused by stopping smoking. It is used with a patient support program recommended by your physician. This medicine may be used for other purposes; ask your health care provider or pharmacist if you have questions. COMMON BRAND NAME(S): Chantix What should I tell my health care provider before I take this medicine? They need to know if you have any of these conditions: -bipolar disorder, depression, schizophrenia or other mental illness -heart disease -if you often drink alcohol -kidney disease -peripheral vascular disease -seizures -stroke -suicidal thoughts, plans, or attempt; a previous suicide attempt by you or a family member -an unusual or allergic reaction to varenicline, other medicines, foods, dyes, or preservatives -pregnant or trying to get pregnant -breast-feeding How should I use this medicine? Take this medicine by mouth after eating. Take with a full glass of water. Follow the directions on the prescription label. Take your doses at regular intervals. Do not take your medicine more often than directed. There are 3 ways you can use this medicine to help you quit smoking; talk to your health care professional to decide which plan is right for you: 1) you can choose a  quit date and start this medicine 1 week before the quit date, or, 2) you can start taking this medicine before you choose a quit date, and then pick a quit date between day 8 and 35 days of treatment, or, 3) if you are not sure that you are able or willing to quit smoking right  away, start taking this medicine and slowly decrease the amount you smoke as directed by your health care professional with the goal of being cigarette-free by week 12 of treatment. Stick to your plan; ask about support groups or other ways to help you remain cigarette-free. If you are motivated to quit smoking and did not succeed during a previous attempt with this medicine for reasons other than side effects, or if you returned to smoking after this treatment, speak with your health care professional about whether another course of this medicine may be right for you. A special MedGuide will be given to you by the pharmacist with each prescription and refill. Be sure to read this information carefully each time. Talk to your pediatrician regarding the use of this medicine in children. This medicine is not approved for use in children. Overdosage: If you think you have taken too much of this medicine contact a poison control center or emergency room at once. NOTE: This medicine is only for you. Do not share this medicine with others. What if I miss a dose? If you miss a dose, take it as soon as you can. If it is almost time for your next dose, take only that dose. Do not take double or extra doses. What may interact with this medicine? -alcohol or any product that contains alcohol -insulin -other stop smoking aids -theophylline -warfarin This list may not describe all possible interactions. Give your health care provider a list of all the medicines, herbs, non-prescription drugs, or dietary supplements you use. Also tell them if you smoke, drink alcohol, or use illegal drugs. Some items may interact with your medicine. What should I watch for while using this medicine? Visit your doctor or health care professional for regular check ups. Ask for ongoing advice and encouragement from your doctor or healthcare professional, friends, and family to help you quit. If you smoke while on this medication,  quit again Your mouth may get dry. Chewing sugarless gum or sucking hard candy, and drinking plenty of water may help. Contact your doctor if the problem does not go away or is severe. You may get drowsy or dizzy. Do not drive, use machinery, or do anything that needs mental alertness until you know how this medicine affects you. Do not stand or sit up quickly, especially if you are an older patient. This reduces the risk of dizzy or fainting spells. Sleepwalking can happen during treatment with this medicine, and can sometimes lead to behavior that is harmful to you, other people, or property. Stop taking this medicine and tell your doctor if you start sleepwalking or have other unusual sleep-related activity. Decrease the amount of alcoholic beverages that you drink during treatment with this medicine until you know if this medicine affects your ability to tolerate alcohol. Some people have experienced increased drunkenness (intoxication), unusual or sometimes aggressive behavior, or no memory of things that have happened (amnesia) during treatment with this medicine. The use of this medicine may increase the chance of suicidal thoughts or actions. Pay special attention to how you are responding while on this medicine. Any worsening of mood, or thoughts of suicide or dying  should be reported to your health care professional right away. What side effects may I notice from receiving this medicine? Side effects that you should report to your doctor or health care professional as soon as possible: -allergic reactions like skin rash, itching or hives, swelling of the face, lips, tongue, or throat -acting aggressive, being angry or violent, or acting on dangerous impulses -breathing problems -changes in vision -chest pain or chest tightness -confusion, trouble speaking or understanding -new or worsening depression, anxiety, or panic attacks -extreme increase in activity and talking (mania) -fast,  irregular heartbeat -feeling faint or lightheaded, falls -fever -pain in legs when walking -problems with balance, talking, walking -redness, blistering, peeling or loosening of the skin, including inside the mouth -ringing in ears -seeing or hearing things that aren't there (hallucinations) -seizures -sleepwalking -sudden numbness or weakness of the face, arm or leg -thoughts about suicide or dying, or attempts to commit suicide -trouble passing urine or change in the amount of urine -unusual bleeding or bruising -unusually weak or tired Side effects that usually do not require medical attention (report to your doctor or health care professional if they continue or are bothersome): -constipation -headache -nausea, vomiting -strange dreams -stomach gas -trouble sleeping This list may not describe all possible side effects. Call your doctor for medical advice about side effects. You may report side effects to FDA at 1-800-FDA-1088. Where should I keep my medicine? Keep out of the reach of children. Store at room temperature between 15 and 30 degrees C (59 and 86 degrees F). Throw away any unused medicine after the expiration date. NOTE: This sheet is a summary. It may not cover all possible information. If you have questions about this medicine, talk to your doctor, pharmacist, or health care provider.  2018 Elsevier/Gold Standard (2014-12-17 16:14:23)

## 2016-10-10 NOTE — Progress Notes (Signed)
SLEEP MEDICINE CLINIC   Provider:  Larey Seat, M D  Referring Provider: Leanna Battles, MD Primary Care Physician:  Leanna Battles, MD  Chief Complaint  Patient presents with  . Follow-up    cpap follow up    HPI: Interval history from 10/10/2016. I have the pleasure of seeing [name] today 7 months after I initially had a consultation with him. The patient underwent a baseline polysomnography on 03/21/2016 which resulted in an AHI of 13.1, a rather mild apnea, which was strongly accentuated during rapid eye movement sleep. During REM sleep the AHI was 61.9 per hour and the patient experienced prolonged periods of hypoxemia for a total desaturation time of 249 minutes. Given this comorbidity of possible COPD CPAP was deemed the only treatment option. The patient was placed on CPAP in a study on 04/25/2016 and titrated to 9 cm water pressure with the help of 1 L of oxygen. He continues to use oxygen at home he was fitted with a full face mask F 20 in large size.   The patient shows excellent compliance of 100% for the last 30 days with an average of 5 hours and 58 minutes nightly use set pressure at 9 cm with 3 cm EPR and a residual AHI of only 0.2 apneas per hour of sleep. As mentioned above, CPAP is supposed to be used with oxygen. He reports he has not been told to use CPAP alongside oxygen.  [facility].  Epworth sleepiness score is now endorsed at 3 points. The patient endorsed the geriatric depression score at only 2 out of 15 points not indicative for depression to be present.      Paul [name] is a 66 y.o. male , seen here as a referral  from Dr. Philip Aspen for a sleep consultation, Mrs. Weidner reports that he underwent a sleep study at  [facility],  which must have resulted in the diagnosis of obstructive sleep apnea as he was asked to start using CPAP.  He could not tolerate CPAP he stated and as alternative therapy oxygen was ordered.  Apparently, he has been on oxygen ever since but he does not feel that this improves his sleep pattern, his  sleep quality. He continues to be a daily smoker has smoked for well over 50 years, has COPD as well as always a, he drinks 3-4 caffeinated beverages a day, does drink hard liquor but not daily. He has very high blood pressures which were confirmed and his last primary care visit with Dr. Bevelyn Buckles on 02/08/2016, when e presented with 192/112 blood pressure. At the same visit he reported balance problems, and had fallen the night before.  He was diagnosed with benign positional vertigo, but also esophageal reflux disease, hyperlipidemia, chronic insomnia with noncompliance with CPAP, untreated obstructive sleep apnea, cervical spine degenerative disc disease, chronic pain of the left shoulder, gout, obesity, hypertension, coronary artery disease with a heart attack in 2014, followed by Dr. Christinia Gully- pulmonary,  Glenetta Hew ,cardiology -[facility], neurosurgery.  Chief complaint according to patient : "I cannot sleep , I cough"   Sleep habits are as follows: The patient endorses a usual bedtime at 11 PM, the bedroom is dark, cool and quiet. He usually sleeps on his back most of the time, props up on one pillow.  He reports he cannot sleep in a recliner in a seated position. He sleeps alone. He has been told many times that he snores loudly and  that he stops breathing in his sleep. He is up to 3 times waking for bathroom breaks each night, the last one at 5 or 6 AM. He does not report vivid dreams, nightmares, sleepwalking, night terrors, enuresis, sleep talking or sleep eating. He usually arises around 7 AM, his entrained pattern from his working days was to rise at 4 AM to get ready for work. He is now retired 5 years and his sleep pattern has changed. He wakes up spontaneously around 7, takes breakfast at home. He takes naps on week ends, 15 minutes only.   Sleep medical history  and family sleep history:  No tonsil or adenoid ectomy, anterior fusion of the neck with Dr. Luiz Ochoa in 2010, no history of traumatic brain injuries, concussions or contusions. No psychiatric history. Remarkable is his family history one brother expired at age 83 after myocardial infarction and sepsis, 2 sisters one healthy 1 has COPD coronary artery disease and strokes, he also has 2 children both are healthy.   Social history:  Divorced father of 2, See above, the patient is retired, used to work early shift. 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 He drinks vodka, does at social occasions, he drinks a couple of coffees in the morning, but more than 3 drinks of caffeinated sodas daily.   Review of Systems: Out of a complete 14 system review, the patient complains of only the following symptoms, and all other reviewed systems are negative.   Epworth score 6 , Fatigue severity score n/a  , depression score 4/15    Social History   Social History  . Marital status: Divorced    Spouse name: N/A  . Number of children: N/A  . Years of education: N/A   Occupational History  . Not on file.   Social History Main Topics  . Smoking status: Current Some Day Smoker    Packs/day: 1.00    Years: 40.00    Types: Cigarettes  . Smokeless tobacco: Never Used     Comment: 02/18/14- smokes occ cig maybe 3 x per wk  . Alcohol use 0.0 oz/week     Comment: social 2-3 drink a week  . Drug use: No  . Sexual activity: Not Currently   Other Topics Concern  . Not on file   Social History Narrative   He is a 66 y.o. divorced father of 66, grandfather 1.   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   He quit smoking in August 2013, after smoking a pack a day for roughly  40 years.   He drinks socially 2-3 drinks a week only.   He does not get routine exercise, mostly due to 2 fatigue and dyspnea. Otherwise been relatively sedentary.    Family History  Problem Relation Age of Onset  . Heart disease Sister   . Heart attack Brother   . Emphysema Father        smoked  . Aneurysm Father     Past Medical History:  Diagnosis Date  . CAD S/P percutaneous coronary angioplasty -- D1, Xience Xpedition DES 2.5 mm x 33 mm 11/24/2011   a) Mild-to-moderate 30-40% lesions in the RCA, LAD and Circumflex. b) CULPRIT LESION: long tubular 70-80% lesion in D1 with FFR of 0.7 -->  PCI w/ Xience Xpedition DES 2.5 mm x 30 mm (2.65 MM); c) Lexiscan Myoview 11/2013: No Ischemia or Infarct (Inferior Gut Attenuation) EF 63%.  Marland Kitchen COPD (chronic obstructive pulmonary disease) ([place])    "don't have full case of it; I'm right there at it"  . Essential hypertension 11/24/2011  . Exertional dyspnea, chronic   . Gout   . History of Unstable angina 11/24/2011   Referred for cardiac catheterization  . Hyperlipidemia with target LDL less than 70 11/24/2011  . Obesity (BMI 30.0-34.9)   . OSA (obstructive sleep apnea), uses oxygen at home did not tolerate cpap 11/24/2011    Past Surgical History:  Procedure Laterality Date  . CERVICAL SPINE SURGERY  2012  . CORONARY ANGIOPLASTY WITH STENT PLACEMENT  11/23/2011   "1; first one"  . LEFT HEART CATHETERIZATION WITH CORONARY ANGIOGRAM N/A 11/23/2011   Procedure: LEFT HEART CATHETERIZATION WITH CORONARY ANGIOGRAM;  Surgeon: Leonie Man, MD;  Location: [facility];  Service: Cardiovascular;  Laterality: N/A;    Current Outpatient Prescriptions  Medication Sig Dispense Refill  . albuterol (PROAIR HFA) 108 (90 BASE) MCG/ACT inhaler Inhale 2 puffs into the lungs every 6 (six) hours as needed for wheezing or shortness of breath.    Marland Kitchen albuterol (PROVENTIL) (2.5 MG/3ML) 0.083% nebulizer solution Take 2.5 mg by nebulization every 6 (six) hours as needed for  wheezing or shortness of breath.    . allopurinol (ZYLOPRIM) 300 MG tablet Take 300 mg by mouth daily.    Marland Kitchen amLODipine (NORVASC) 5 MG tablet Take 5 mg by mouth daily.    Marland Kitchen aspirin 325 MG tablet Take 325 mg by mouth daily.    Marland Kitchen atorvastatin (LIPITOR) 20 MG tablet Take 1 tablet (20 mg total) by mouth daily at 6 PM. 90 tablet 1  . budesonide-formoterol (SYMBICORT) 160-4.5 MCG/ACT inhaler Take 2 puffs first thing in am and then another 2 puffs about 12 hours later. 1 Inhaler 11  . clopidogrel (PLAVIX) 75 MG tablet Take 1 tablet (75 mg total) by mouth daily. 90 tablet 1  . colchicine (COLCRYS) 0.6 MG tablet Take two tabs by mouth at once then a third tablet one hour later. 3 tablet 0  . cyclobenzaprine (FLEXERIL) 10 MG tablet Take 10 mg by mouth daily.    . meclizine (ANTIVERT) 25 MG tablet Take 25 mg by mouth daily.    . meloxicam (MOBIC) 15 MG tablet Take 15 mg by mouth daily.    . metoprolol tartrate (LOPRESSOR) 25 MG tablet Take 25 mg by mouth daily.    . nortriptyline (PAMELOR) 25 MG capsule Take 25 mg by mouth at bedtime.    Marland Kitchen omeprazole (PRILOSEC) 20 MG capsule Take 20 mg by mouth daily.    Marland Kitchen oxyCODONE-acetaminophen (PERCOCET/ROXICET) 5-325 MG per tablet Take 1 tablet by mouth every 6 (six) hours as needed.  0  . traMADol (ULTRAM) 50 MG tablet Take 1 tablet (50 mg total) by mouth every 6 (six) hours as needed for moderate pain or severe pain. 15 tablet 0  . zolpidem (AMBIEN) 10 MG tablet Take 10 mg by mouth at bedtime.    . nitroGLYCERIN (NITROSTAT) 0.4 MG SL tablet Place 1 tablet (0.4 mg total) under the tongue every 5 (five) minutes as needed for chest pain. 25 tablet 4   No current facility-administered medications for this visit.     Allergies as of 10/10/2016 - Review Complete 10/10/2016  Allergen Reaction Noted  . Codeine Other (See Comments) 11/22/2011  .  Duloxetine hcl  12/04/2013    Vitals: BP (!) 151/83   Pulse 79   Resp 20   Ht 5\' 5"  (1.651 m)   Wt 186 lb (84.4 kg)    BMI 30.95 kg/m  Last Weight:  Wt Readings from Last 1 Encounters:  10/10/16 186 lb (84.4 kg)   WIO:MBTD mass index is 30.95 kg/m.     Last Height:   Ht Readings from Last 1 Encounters:  10/10/16 5\' 5"  (1.651 m)    Physical exam:  General: The patient is awake, alert and appears not in acute distress.   Head: Normocephalic, atraumatic. Neck is supple. Mallampati  3,  neck circumference: 16.5 . Nasal airflow congested ,  Retrognathia is not seen. Full dentures.  Cardiovascular:  Regular rate and rhythm , without  murmurs or carotid bruit, and without distended neck veins. Respiratory: wheezing , rhonchi.Skin:  Spider navi, facial erythema. Trunk: The patient's posture is erect .  Neurologic exam :The patient is awake and alert, oriented to place and time.  Attention span & concentration ability appears normal.  Speech is fluent,  without  dysarthria, dysphonia or aphasia.  Mood and affect are appropriate.  Cranial nerves: Pupils are equal and briskly reactive to light. Funduscopic exam without evidence of pallor or edema.  Extraocular movements  in vertical and horizontal planes intact and without nystagmus. Visual fields by finger perimetry are intact. Hearing to finger rub intact.  Facial sensation intact to fine touch. Facial motor strength is symmetric and tongue and uvula move midline. Shoulder shrug was symmetrical.  Deep tendon reflexes: in the  upper and lower extremities are symmetric and intact. Babinski maneuver response is  downgoing.  The patient was advised of the nature of the diagnosed sleep disorder , the treatment options and risks for general a health and wellness arising from not treating the condition.  I spent more than  25 minutes of face to face time with the patient. Greater than 50% of time was spent in counseling and coordination of care. We have discussed the diagnosis and differential and I answered the patient's questions.     Assessment:  After physical  and neurologic examination, review of laboratory studies,  Personal review of imaging studies, reports of other /same  Imaging studies ,  Results of polysomnography/ neurophysiology testing and pre-existing records as far as provided in visit., my assessment is   1) Mr. Junker has known obstructive sleep apnea which is  overlapping with COPD. He was placed on oxygen only but no longer finds that this allows him to have refreshing and restorative sleep. He has been using CPAP compliantly , but not with oxygen. Was not told how to connect the two. I will contact [facility].   2) in addition to his ongoing tobacco use, frequent bronchitic inflammation, coughing.  He cannot afford Chantix, had tried nicotine patches without success.  3) he has coronary artery disease, has suffered an MI in the past, other comorbidities that make CPAP and oxygen necessary are :COPD, oxygen dependency, CAD, Nocturia, OSA- REM dependent .      Asencion Partridge Veronique Warga MD  10/10/2016   CC: Leanna Battles, [address] [place], [place] [address]

## 2016-10-11 NOTE — Telephone Encounter (Signed)
I called pt. He explains that he owns an oxygen concentrator and just needs it hooked up to his cpap. I will see if [facility] can take care of this. Pt verbalized understanding.

## 2016-10-11 NOTE — Telephone Encounter (Signed)
Paul [name] overnight pulse oximetry does suggest prolonged hypoxemia at night. However Medicare cannot accept overnight ambulatory studies as a basis for ordering oxygen. I will have to ask Paul [name] to come into the lab and to be titrated to oxygen during a sleep study. I will forward this message to [name], sleep lab manager in place in order with carbon copy to my nurse.  Paul [name] is a CPAP user with her underlying condition of COPD. I would appreciate if the study could be either declared a polysomnography while on CPAP or a re-titration to CPAP and oxygen .CD

## 2016-10-12 NOTE — Telephone Encounter (Signed)
Received this notice from [facility]:  "Since he has his own concentrator we can provide him with the proper tubing to bled his oxygen into his cpap machine. That will not require a titration."

## 2016-10-30 ENCOUNTER — Other Ambulatory Visit: Payer: Self-pay | Admitting: Internal Medicine

## 2016-10-30 DIAGNOSIS — R740 Nonspecific elevation of levels of transaminase and lactic acid dehydrogenase [LDH]: Secondary | ICD-10-CM | POA: Diagnosis not present

## 2016-10-30 DIAGNOSIS — F3289 Other specified depressive episodes: Secondary | ICD-10-CM | POA: Diagnosis not present

## 2016-10-30 DIAGNOSIS — I10 Essential (primary) hypertension: Secondary | ICD-10-CM | POA: Diagnosis not present

## 2016-10-30 DIAGNOSIS — R202 Paresthesia of skin: Secondary | ICD-10-CM | POA: Diagnosis not present

## 2016-10-30 DIAGNOSIS — M79601 Pain in right arm: Secondary | ICD-10-CM | POA: Diagnosis not present

## 2016-10-30 DIAGNOSIS — Z9861 Coronary angioplasty status: Secondary | ICD-10-CM | POA: Diagnosis not present

## 2016-10-30 DIAGNOSIS — R7309 Other abnormal glucose: Secondary | ICD-10-CM | POA: Diagnosis not present

## 2016-10-30 DIAGNOSIS — J449 Chronic obstructive pulmonary disease, unspecified: Secondary | ICD-10-CM | POA: Diagnosis not present

## 2016-10-30 DIAGNOSIS — G4733 Obstructive sleep apnea (adult) (pediatric): Secondary | ICD-10-CM | POA: Diagnosis not present

## 2016-10-30 DIAGNOSIS — Z6831 Body mass index (BMI) 31.0-31.9, adult: Secondary | ICD-10-CM | POA: Diagnosis not present

## 2016-11-09 ENCOUNTER — Ambulatory Visit
Admission: RE | Admit: 2016-11-09 | Discharge: 2016-11-09 | Disposition: A | Discharge status: Home / Self Care | Payer: Medicare Other | Source: Ambulatory Visit | Attending: Internal Medicine | Admitting: Internal Medicine

## 2016-11-09 DIAGNOSIS — M79601 Pain in right arm: Secondary | ICD-10-CM

## 2016-11-09 DIAGNOSIS — M50223 Other cervical disc displacement at C6-C7 level: Secondary | ICD-10-CM | POA: Diagnosis not present

## 2016-11-30 ENCOUNTER — Ambulatory Visit (INDEPENDENT_AMBULATORY_CARE_PROVIDER_SITE_OTHER): Payer: Medicare Other | Admitting: Physical Medicine and Rehabilitation

## 2016-11-30 ENCOUNTER — Ambulatory Visit (INDEPENDENT_AMBULATORY_CARE_PROVIDER_SITE_OTHER): Payer: Medicare Other

## 2016-11-30 ENCOUNTER — Encounter (INDEPENDENT_AMBULATORY_CARE_PROVIDER_SITE_OTHER): Payer: Self-pay | Admitting: Physical Medicine and Rehabilitation

## 2016-11-30 VITALS — BP 117/80 | HR 70

## 2016-11-30 DIAGNOSIS — M25512 Pain in left shoulder: Secondary | ICD-10-CM | POA: Diagnosis not present

## 2016-11-30 DIAGNOSIS — M5441 Lumbago with sciatica, right side: Secondary | ICD-10-CM

## 2016-11-30 DIAGNOSIS — M4316 Spondylolisthesis, lumbar region: Secondary | ICD-10-CM

## 2016-11-30 DIAGNOSIS — G8929 Other chronic pain: Secondary | ICD-10-CM | POA: Diagnosis not present

## 2016-11-30 DIAGNOSIS — M961 Postlaminectomy syndrome, not elsewhere classified: Secondary | ICD-10-CM

## 2016-11-30 DIAGNOSIS — M5442 Lumbago with sciatica, left side: Secondary | ICD-10-CM

## 2016-11-30 DIAGNOSIS — M19012 Primary osteoarthritis, left shoulder: Secondary | ICD-10-CM | POA: Diagnosis not present

## 2016-11-30 DIAGNOSIS — Q762 Congenital spondylolisthesis: Secondary | ICD-10-CM | POA: Diagnosis not present

## 2016-11-30 DIAGNOSIS — I2510 Atherosclerotic heart disease of native coronary artery without angina pectoris: Secondary | ICD-10-CM | POA: Diagnosis not present

## 2016-11-30 DIAGNOSIS — M542 Cervicalgia: Secondary | ICD-10-CM | POA: Diagnosis not present

## 2016-11-30 MED ORDER — DIAZEPAM 5 MG PO TABS: ORAL_TABLET | ORAL | 0 refills | Status: DC

## 2016-11-30 NOTE — Progress Notes (Deleted)
Patient states he constantly has neck pain that he says is coming from his left shoulder that he needs to have replaced. Having pain down arm to hands for 8 months. Wakes up in the morning with hand numbness and tingling. Loss of strength in hands and fingers. Having hard time opening things. Worse in right hand.Right hand dominant.

## 2016-12-06 ENCOUNTER — Encounter (INDEPENDENT_AMBULATORY_CARE_PROVIDER_SITE_OTHER): Payer: Self-pay | Admitting: Physical Medicine and Rehabilitation

## 2016-12-06 NOTE — Progress Notes (Signed)
Paul [name] - 66 y.o. male MRN [id number]  Date of birth: 03/10/1951  Office Visit Note: Visit Date: 11/30/2016 PCP: Paul Battles, MD Referred by: Paul Battles, MD  Subjective: Chief Complaint  Patient presents with  . Neck - Pain  . Lower Back - Pain  . Left Lower Leg - Pain, Numbness  . Right Lower Leg - Pain, Numbness   HPI: Paul [name] is a 66 year old right-hand-dominant gentleman kindly referred here by Paul [name] for evaluation and management of neck and shoulder pain. The patient however also talks about today as a main complaint is low back pain and bilateral hip and leg pain. In terms of his neck and shoulder pain he has a history of prior cervical fusion. This was an ACDF from C4-C6 by Paul [name]. He reports recent worsening of neck pain in general but he feels like it's coming from his left shoulder. He reports the pain in the left neck is really more the trapezius area. He reports having pain in the left shoulder worse with movement. He says he needs to have a shoulder roll placed. He has been having pain however down the arm to the hands for about 8 months. He has not noted any specific injury or reasoning for the worsening. He has tried muscle relaxers and anti-inflammatories and tramadol without relief. An MRI of the cervical spine was obtained by Paul [name] and this is reviewed below. Briefly, the MRI shows some adjacent level disease at C3-for with some foraminal narrowing moderately but no central canal stenosis or disc herniations. Prior fusion and decompression looks okay. He reports worsening symptoms waking up in the morning with hand numbness and tingling. He feels like he's had loss of strength in his hands and fingers. He feels like it somewhat worse in the right hand when this numbness and tingling. He has a hard time opening objects. He has not had any electrodiagnostic studies or ever had diagnosis of carpal tunnel syndrome or carpal tunnel release. He  denies any pain in the right shoulder.  He reports a lot of chronic back pain worse with standing and moving. He's had no prior lumbar surgery. He's had no recent imaging of the lumbar spine and has not had MRI of the lumbar spine. He has not had any physical therapy of his lumbar spine. He reports no specific injury but worsening pain particularly in the morning and going from sit to stand. He is getting some pain radiating to both hips laterally and more of an L5 distribution. He has not noted any focal weakness or bowel or bladder symptoms. He's had no focal weakness or foot drop. He gets a lot of pain if he tries to stand for any length of time.  His case is complicated by significant coronary artery disease, COPD and obstructive sleep apnea. He is on chronic Plavix anticoagulation.    Review of Systems  Constitutional: Negative for chills, fever, malaise/fatigue and weight loss.  HENT: Negative for hearing loss and sinus pain.   Eyes: Negative for blurred vision, double vision and photophobia.  Respiratory: Negative for cough and shortness of breath.   Cardiovascular: Negative for chest pain, palpitations and leg swelling.  Gastrointestinal: Negative for abdominal pain, nausea and vomiting.  Genitourinary: Negative for flank pain.  Musculoskeletal: Positive for back pain, joint pain and neck pain. Negative for myalgias.  Skin: Negative for itching and rash.  Neurological: Positive for tingling. Negative for tremors, focal weakness and weakness.  Endo/Heme/Allergies: Negative.  Psychiatric/Behavioral: Negative for depression.  All other systems reviewed and are negative.  Otherwise per HPI.  Assessment & Plan: Visit Diagnoses:  1. Chronic bilateral low back pain with bilateral sciatica   2. Chronic left shoulder pain   3. Primary osteoarthritis, left shoulder   4. Cervicalgia   5. Post laminectomy syndrome   6. Congenital spondylolysis   7. Spondylolisthesis of lumbar region       Plan: Findings:  1. Chronic worsening neck pain with left shoulder pain and potentially radicular-type pain in the arms versus a underlying carpal tunnel syndrome. See below for comments on carpal tunnel syndrome. His MRI of his cervical spine is not that impressive he does have adjacent level facet arthropathy and uncovertebral joint spurring above his fusion. He could have neck pain that is facet joint mediated. He also has trigger points to palpation on exam and this could be an issue as well. He has not had specific physical therapy with dry needling. In terms of his left shoulder he does have pain with rotation and he does have some impingement signs of the left shoulder. He reports a lot of his pain during the exam is really coming from the left shoulder. I think at this point he would do well with a diagnostic left glenohumeral joint injection with fluoroscopic guidance. We'll go ahead and schedule this first just to see if we can get him some relief of her he's complaining of mostly. Depending on that relief we could look at potential trigger point injection or facet joint block above his fusion. I don't think he really needs a cervical epidural not sure of the benefit would outweigh the risk at this point but this could be something we look at in the future. No real reason for radicular pain noted on the MRI. In terms of any spinal injection he is on chronic Plavix. Making his case even more difficult: Away as he is definitely phobic about needles and injections. We will provide Valium preprocedure even though it's a pretty simple procedure for the shoulder.  2. Numbness and tingling in both hands with difficulty gripping objects. Part of this is arthritic in nature but he could have a underlying carpal tunnel syndrome. He has a lot of factors going along with this was worse in the morning and shaking his hands with numbness and tingling in the somewhat nondermatomal. He talked about electrodiagnostic  studies I think at some point he is going to need that to maybe help rule out radiculopathy or rule in carpal tunnel problems. This point were to hold off on that.  3.  He had a new problem today that really hasn't been thoroughly evaluated which is chronic low back pain and some bilateral hip pain which could be radicular in nature versus referral pain from the facet joints of the pars defects. X-rays were taken today and is reviewed below as well. He does have bilateral pars defect with a grade 1 listhesis. He likely could have some stenosis. At this point because his shoulders the worst pain he is having we are going to continue that for sincerely does with the injection. Would look at potential MRI of the lumbar spine versus initial treatment. We did talk about activity modification and hamstring stretching.    Meds & Orders:  Meds ordered this encounter  Medications  . diazepam (VALIUM) 5 MG tablet    Sig: Take 1 by mouth 1 to 2 hours pre-procedure. May repeat if necessary.  Dispense:  2 tablet    Refill:  0    Orders Placed This Encounter  Procedures  . XR Lumbar Spine Complete    Follow-up: Return for Left glenohumeral joint anesthetic arthrogram.   Procedures: No procedures performed  No notes on file   Clinical History: MRI CERVICAL SPINE WITHOUT CONTRAST  TECHNIQUE: Multiplanar, multisequence MR imaging of the cervical spine was performed. No intravenous contrast was administered.  COMPARISON:  Cervical spine radiograph 07/24/2012  Cervical spine MRI 07/24/2012  FINDINGS: Alignment: Normal  Vertebrae: C4-C6 ACDF.  No acute abnormality.  Cord: Normal signal and caliber.  Posterior Fossa, vertebral arteries, paraspinal tissues: Negative.  Disc levels:  C1-C2: Normal.  C2-C3: Normal disc space and facets. No spinal canal or neuroforaminal stenosis.  C3-C4: Bilateral uncovertebral hypertrophy with moderate neural foraminal stenosis. Mild central  spinal canal stenosis. This level is unchanged.  C4-C5: Postsurgical changes with wide patency of the thecal sac. No foraminal stenosis.  C5-C6: Postsurgical changes with widely patent spinal canal. Mild narrowing of the left neural foramen.  C6-C7: Small central disc protrusion narrows the ventral thecal sac. Mild spinal canal stenosis. No neural foraminal stenosis.  C7-T1: Normal disc space and facets. No spinal canal or neuroforaminal stenosis.  IMPRESSION: 1. Moderate bilateral neural foraminal stenosis and mild spinal canal stenosis at C3-C4, unchanged. 2. C4-C6 ACDF with widely patent spinal canal.   Electronically Signed   By: Ulyses Jarred M.D.   On: 11/09/2016 21:46  He reports that he has been smoking Cigarettes.  He has a 40.00 pack-year smoking history. He has never used smokeless tobacco. No results for input(s): HGBA1C, LABURIC in the last 8760 hours.  Objective:  VS:  HT:    WT:   BMI:     BP:117/80  HR:70bpm  TEMP: ( )  RESP:  Physical Exam  Constitutional: He is oriented to person, place, and time. He appears well-developed and well-nourished. No distress.  HENT:  Head: Normocephalic and atraumatic.  Nose: Nose normal.  Mouth/Throat: Oropharynx is clear and moist.  Eyes: Pupils are equal, round, and reactive to light. Conjunctivae are normal.  Neck: No tracheal deviation present.  Cardiovascular: Regular rhythm and intact distal pulses.   Pulmonary/Chest: Effort normal and breath sounds normal.  Abdominal: Soft. He exhibits no distension.  Musculoskeletal: He exhibits no deformity.  Cervical range of motion is limited with rotation and extension. He has pretty good forward flexion. He does sit with a forward flexed spine. He has positive trigger points in the levator scapula and rhomboids. These reproduce some of his pain. He does have impingement signs of the left shoulder with decreased range of motion causing a lot of pain with motion. He has  some tenderness over the biceps tendon on the left. Right shoulder was more free. He has good strength in the upper extremities bilaterally without any deficits. He has a negative Hoffmann's test bilaterally. He has an equivocal Phalen's test with some numbness in general the hands. He has normal bulk and muscle. There is no significant atrophy of the FDI or APB muscles bilaterally. There may be a little bit of flattening of the APB right more than left.  Lumbar spine exam shows patient standing with a forward flexed. He has pain with extension rotation. He has no pain over the greater trochanters and no pain with hip rotation. He has good distal strength. He has no clonus.  Lymphadenopathy:    He has no cervical adenopathy.  Neurological: He is alert and  oriented to person, place, and time.  Skin: Skin is warm. No rash noted.  Psychiatric: He has a normal mood and affect. His behavior is normal.  Nursing note and vitals reviewed.   Ortho Exam Imaging: No results found.  Past Medical/Family/Surgical/Social History: Medications & Allergies reviewed per EMR Patient Active Problem List   Diagnosis Date Noted  . OSA on CPAP 10/10/2016  . OSA and COPD overlap syndrome ([place]) 03/02/2016  . Bronchitis, chronic obstructive, with exacerbation ([place]) 03/02/2016  . Hypoxemia 03/02/2016  . Dependence on supplemental oxygen 03/02/2016  . CAD, multiple vessel 03/02/2016  . Gastroesophageal reflux disease 03/02/2016  . HTN (hypertension), malignant 03/02/2016  . Cigarette smoker 02/22/2014  . DOE (dyspnea on exertion) 01/20/2014  . Obesity (BMI 30.0-34.9)   . Essential hypertension 11/24/2011  . Hyperlipidemia with target LDL less than 70 11/24/2011  . OSA (obstructive sleep apnea), uses oxygen at home did not tolerate cpap 11/24/2011  . COPD GOLD I  11/24/2011  . CAD S/P percutaneous coronary angioplasty -- D1, Xience Xpedition DES 2.5 mm x 33 mm 11/24/2011   Past Medical History:  Diagnosis Date   . CAD S/P percutaneous coronary angioplasty -- D1, Xience Xpedition DES 2.5 mm x 33 mm 11/24/2011   a) Mild-to-moderate 30-40% lesions in the RCA, LAD and Circumflex. b) CULPRIT LESION: long tubular 70-80% lesion in D1 with FFR of 0.7 --> PCI w/ Xience Xpedition DES 2.5 mm x 30 mm (2.65 MM); c) Lexiscan Myoview 11/2013: No Ischemia or Infarct (Inferior Gut Attenuation) EF 63%.  Marland Kitchen COPD (chronic obstructive pulmonary disease) ([place])    "don't have full case of it; I'm right there at it"  . Essential hypertension 11/24/2011  . Exertional dyspnea, chronic   . Gout   . History of Unstable angina 11/24/2011   Referred for cardiac catheterization  . Hyperlipidemia with target LDL less than 70 11/24/2011  . Obesity (BMI 30.0-34.9)   . OSA (obstructive sleep apnea), uses oxygen at home did not tolerate cpap 11/24/2011   Family History  Problem Relation Age of Onset  . Heart disease Sister   . Heart attack Brother   . Emphysema Father        smoked  . Aneurysm Father    Past Surgical History:  Procedure Laterality Date  . CERVICAL SPINE SURGERY  2012  . CORONARY ANGIOPLASTY WITH STENT PLACEMENT  11/23/2011   "1; first one"  . LEFT HEART CATHETERIZATION WITH CORONARY ANGIOGRAM N/A 11/23/2011   Procedure: LEFT HEART CATHETERIZATION WITH CORONARY ANGIOGRAM;  Surgeon: Leonie Man, MD;  Location[facility] C[facility] LAB;  Service: Cardiovascular;  Laterality: N/A;   Social History   Occupational History  . Not on file.   Social History Main Topics  . Smoking status: Current Some Day Smoker    Packs/day: 1.00    Years: 40.00    Types: Cigarettes  . Smokeless tobacco: Never Used     Comment: 02/18/14- smokes occ cig maybe 3 x per wk  . Alcohol use 0.0 oz/week     Comment: social 2-3 drink a week  . Drug use: No  . Sexual activity: Not Currently

## 2016-12-08 ENCOUNTER — Ambulatory Visit (INDEPENDENT_AMBULATORY_CARE_PROVIDER_SITE_OTHER): Payer: Medicare Other | Admitting: Physical Medicine and Rehabilitation

## 2016-12-08 ENCOUNTER — Encounter (INDEPENDENT_AMBULATORY_CARE_PROVIDER_SITE_OTHER): Payer: Self-pay | Admitting: Physical Medicine and Rehabilitation

## 2016-12-08 ENCOUNTER — Ambulatory Visit (INDEPENDENT_AMBULATORY_CARE_PROVIDER_SITE_OTHER): Payer: Medicare Other

## 2016-12-08 DIAGNOSIS — G8929 Other chronic pain: Secondary | ICD-10-CM | POA: Diagnosis not present

## 2016-12-08 DIAGNOSIS — M25512 Pain in left shoulder: Secondary | ICD-10-CM | POA: Diagnosis not present

## 2016-12-08 NOTE — Progress Notes (Signed)
Paul [name] - 66 y.o. male MRN [id number]  Date of birth: [birth year]  Office Visit Note: Visit Date: 12/08/2016 PCP: Leanna Battles, MD Referred by: Leanna Battles, MD  Subjective: Chief Complaint  Patient presents with  . Left Shoulder - Pain   HPI: Paul [name] is a 66 year old right-hand dominant gentleman that I saw her recently for evaluation and management. Please see our prior evaluation and management note for further details and justification. He is having left shoulder pain with mechanical symptoms and osteoarthritis on prior x-rays. He has a significant phobia for injections. We did give him preprocedure Valium. He has had a prior C4-C6 ACDF.    ROS Otherwise per HPI.  Assessment & Plan: Visit Diagnoses:  1. Chronic left shoulder pain     Plan: Findings:  Left intra-articular glenohumeral joint injection with fluoroscopic guidance. Patient did have increased range of motion and decreased pain with the anesthetic phase.    Meds & Orders: No orders of the defined types were placed in this encounter.   Orders Placed This Encounter  Procedures  . Large Joint Injection/Arthrocentesis  . XR C-ARM NO REPORT    Follow-up: Return in about 2 weeks (around 12/22/2016).   Procedures: Left glenohumeral joint intra-articular injection fluoroscopic guidance Date/Time: 12/08/2016 9:56 AM Performed by: Magnus Sinning Authorized by: Magnus Sinning   Consent Given by:  Patient Site marked: the procedure site was marked   Timeout: prior to procedure the correct patient, procedure, and site was verified   Indications:  Pain and diagnostic evaluation Location:  Shoulder Site:  L glenohumeral Prep: patient was prepped and draped in usual sterile fashion   Needle Size:  22 G Needle Length:  3.5 inches Approach:  Anteromedial Ultrasound Guidance: No   Fluoroscopic Guidance: Yes   Arthrogram: No   Medications:  3 mL bupivacaine 0.5 %; 80 mg triamcinolone acetonide 40  MG/ML Aspiration Attempted: Yes   Patient tolerance:  Patient tolerated the procedure well with no immediate complications  There was excellent flow of contrast producing a partial arthrogram of the glenohumeral joint. The patient did have relief of symptoms during the anesthetic phase of the injection.     No notes on file   Clinical History: MRI CERVICAL SPINE WITHOUT CONTRAST  TECHNIQUE: Multiplanar, multisequence MR imaging of the cervical spine was performed. No intravenous contrast was administered.  COMPARISON:  Cervical spine radiograph 07/24/2012  Cervical spine MRI 07/24/2012  FINDINGS: Alignment: Normal  Vertebrae: C4-C6 ACDF.  No acute abnormality.  Cord: Normal signal and caliber.  Posterior Fossa, vertebral arteries, paraspinal tissues: Negative.  Disc levels:  C1-C2: Normal.  C2-C3: Normal disc space and facets. No spinal canal or neuroforaminal stenosis.  C3-C4: Bilateral uncovertebral hypertrophy with moderate neural foraminal stenosis. Mild central spinal canal stenosis. This level is unchanged.  C4-C5: Postsurgical changes with wide patency of the thecal sac. No foraminal stenosis.  C5-C6: Postsurgical changes with widely patent spinal canal. Mild narrowing of the left neural foramen.  C6-C7: Small central disc protrusion narrows the ventral thecal sac. Mild spinal canal stenosis. No neural foraminal stenosis.  C7-T1: Normal disc space and facets. No spinal canal or neuroforaminal stenosis.  IMPRESSION: 1. Moderate bilateral neural foraminal stenosis and mild spinal canal stenosis at C3-C4, unchanged. 2. C4-C6 ACDF with widely patent spinal canal.   Electronically Signed   By: Paul [name] M.D.   On: 11/09/2016 21:46  He reports that he has been smoking Cigarettes.  He has a 40.00 pack-year smoking history. He  has never used smokeless tobacco. No results for input(s): HGBA1C, LABURIC in the last 8760  hours.  Objective:  VS:  HT:    WT:   BMI:     BP:   HR: bpm  TEMP: ( )  RESP:  Physical Exam  Musculoskeletal:  Mechanical impingement signs of the left shoulder.    Ortho Exam Imaging: No results found.  Past Medical/Family/Surgical/Social History: Medications & Allergies reviewed per EMR Patient Active Problem List   Diagnosis Date Noted  . OSA on CPAP 10/10/2016  . OSA and COPD overlap syndrome ([place]) 03/02/2016  . Bronchitis, chronic obstructive, with exacerbation ([place]) 03/02/2016  . Hypoxemia 03/02/2016  . Dependence on supplemental oxygen 03/02/2016  . CAD, multiple vessel 03/02/2016  . Gastroesophageal reflux disease 03/02/2016  . HTN (hypertension), malignant 03/02/2016  . Cigarette smoker 02/22/2014  . DOE (dyspnea on exertion) 01/20/2014  . Obesity (BMI 30.0-34.9)   . Essential hypertension 11/24/2011  . Hyperlipidemia with target LDL less than 70 11/24/2011  . OSA (obstructive sleep apnea), uses oxygen at home did not tolerate cpap 11/24/2011  . COPD GOLD I  11/24/2011  . CAD S/P percutaneous coronary angioplasty -- D1, Xience Xpedition DES 2.5 mm x 33 mm 11/24/2011   Past Medical History:  Diagnosis Date  . CAD S/P percutaneous coronary angioplasty -- D1, Xience Xpedition DES 2.5 mm x 33 mm 11/24/2011   a) Mild-to-moderate 30-40% lesions in the RCA, LAD and Circumflex. b) CULPRIT LESION: long tubular 70-80% lesion in D1 with FFR of 0.7 --> PCI w/ Xience Xpedition DES 2.5 mm x 30 mm (2.65 MM); c) Lexiscan Myoview 11/2013: No Ischemia or Infarct (Inferior Gut Attenuation) EF 63%.  Marland Kitchen COPD (chronic obstructive pulmonary disease) ([place])    "don't have full case of it; I'm right there at it"  . Essential hypertension 11/24/2011  . Exertional dyspnea, chronic   . Gout   . History of Unstable angina 11/24/2011   Referred for cardiac catheterization  . Hyperlipidemia with target LDL less than 70 11/24/2011  . Obesity (BMI 30.0-34.9)   . OSA (obstructive sleep apnea), uses  oxygen at home did not tolerate cpap 11/24/2011   Family History  Problem Relation Age of Onset  . Heart disease Sister   . Heart attack Brother   . Emphysema Father        smoked  . Aneurysm Father    Past Surgical History:  Procedure Laterality Date  . CERVICAL SPINE SURGERY  2012  . CORONARY ANGIOPLASTY WITH STENT PLACEMENT  11/23/2011   "1; first one"  . LEFT HEART CATHETERIZATION WITH CORONARY ANGIOGRAM N/A 11/23/2011   Procedure: LEFT HEART CATHETERIZATION WITH CORONARY ANGIOGRAM;  Surgeon: Leonie Man, MD;  Location: [facility] CATH LAB;  Service: Cardiovascular;  Laterality: N/A;   Social History   Occupational History  . Not on file.   Social History Main Topics  . Smoking status: Current Some Day Smoker    Packs/day: 1.00    Years: 40.00    Types: Cigarettes  . Smokeless tobacco: Never Used     Comment: 02/18/14- smokes occ cig maybe 3 x per wk  . Alcohol use 0.0 oz/week     Comment: social 2-3 drink a week  . Drug use: No  . Sexual activity: Not Currently

## 2016-12-08 NOTE — Patient Instructions (Signed)

## 2016-12-08 NOTE — Progress Notes (Deleted)
Patient is here today for planned left shoulder injection. No change in symptoms.

## 2016-12-11 MED ORDER — TRIAMCINOLONE ACETONIDE 40 MG/ML IJ SUSP
80.0000 mg | INTRAMUSCULAR | Status: AC | PRN
  Administered 2016-12-08: 80 mg via INTRA_ARTICULAR

## 2016-12-11 MED ORDER — BUPIVACAINE HCL 0.5 % IJ SOLN
3.0000 mL | INTRAMUSCULAR | Status: AC | PRN
  Administered 2016-12-08: 3 mL via INTRA_ARTICULAR

## 2016-12-26 ENCOUNTER — Encounter (INDEPENDENT_AMBULATORY_CARE_PROVIDER_SITE_OTHER): Payer: Self-pay | Admitting: Physical Medicine and Rehabilitation

## 2016-12-26 ENCOUNTER — Ambulatory Visit (INDEPENDENT_AMBULATORY_CARE_PROVIDER_SITE_OTHER): Payer: Medicare Other | Admitting: Physical Medicine and Rehabilitation

## 2016-12-26 VITALS — BP 140/78 | HR 62

## 2016-12-26 DIAGNOSIS — M4316 Spondylolisthesis, lumbar region: Secondary | ICD-10-CM

## 2016-12-26 DIAGNOSIS — M961 Postlaminectomy syndrome, not elsewhere classified: Secondary | ICD-10-CM | POA: Diagnosis not present

## 2016-12-26 DIAGNOSIS — M5441 Lumbago with sciatica, right side: Secondary | ICD-10-CM | POA: Diagnosis not present

## 2016-12-26 DIAGNOSIS — I2510 Atherosclerotic heart disease of native coronary artery without angina pectoris: Secondary | ICD-10-CM

## 2016-12-26 DIAGNOSIS — Q762 Congenital spondylolisthesis: Secondary | ICD-10-CM | POA: Diagnosis not present

## 2016-12-26 DIAGNOSIS — M25512 Pain in left shoulder: Secondary | ICD-10-CM | POA: Diagnosis not present

## 2016-12-26 DIAGNOSIS — M19012 Primary osteoarthritis, left shoulder: Secondary | ICD-10-CM

## 2016-12-26 DIAGNOSIS — G8929 Other chronic pain: Secondary | ICD-10-CM

## 2016-12-26 DIAGNOSIS — M542 Cervicalgia: Secondary | ICD-10-CM | POA: Diagnosis not present

## 2016-12-26 DIAGNOSIS — M5442 Lumbago with sciatica, left side: Secondary | ICD-10-CM

## 2016-12-26 NOTE — Progress Notes (Deleted)
Can move arm more and do more, but still having some pain. Says it is much better. Says right now he just feels "blah" and has had decreased energy for about 4 months.

## 2017-01-02 ENCOUNTER — Encounter (INDEPENDENT_AMBULATORY_CARE_PROVIDER_SITE_OTHER): Payer: Self-pay | Admitting: Physical Medicine and Rehabilitation

## 2017-01-02 NOTE — Progress Notes (Signed)
Paul [name] - 66 y.o. male MRN [id number]  Date of birth: [birth year]  Office Visit Note: Visit Date: 12/26/2016 PCP: Paul Battles, MD Referred by: Paul Battles, MD  Subjective: Chief Complaint  Patient presents with  . Left Shoulder - Pain   HPI: Paul [name] is a 66 year old gentleman that I have seen on 2 occasions now, first for evaluation and now after left intra-articular glenohumeral joint injection. He does not do well with injections at all. He has a lot of anxiety. We are were able to complete the shoulder injection which is really benign with the use of Valium. He still represent an interesting injection with a lot of movement 10 year. He reports however that his arm is much better and he can do more. He still having some pain with the shoulder but is much much better. His biggest complaint right now other than just multiple musculoskeletal complaints of back pain and neck pain is that he just feels "blah"  with decreased energy for around 4 months. He does have multiple medical problems including COPD, obstructive sleep apnea, coronary artery disease and hypertension. He is on Plavix as well as Chantix for smoking cessation. He is not on antidepressant, typical or atypical. He does not exercise as much as he would like. He has had multiple orthopedic surgeries. He has had a prior cervical ACDF. This is reviewed at her last evaluation note. He continues to have some neck pain radiating into the upper shoulders. Some of this is probably myofascial but really can be unable to complete a cervical epidural injection because the patient really does not want to do that and I think it would be pretty hard to do that with his intolerance of injections even with sedation. He reports continued follow-up with Paul [name] and blood work. He has talked him in the past about this loss of energy. He says that he does sleep well at night.    Review of Systems  Constitutional: Positive for  malaise/fatigue. Negative for chills, fever and weight loss.  HENT: Negative for hearing loss and sinus pain.   Eyes: Negative for blurred vision, double vision and photophobia.  Respiratory: Negative for cough and shortness of breath.   Cardiovascular: Negative for chest pain, palpitations and leg swelling.  Gastrointestinal: Negative for abdominal pain, nausea and vomiting.  Genitourinary: Negative for flank pain.  Musculoskeletal: Positive for back pain, joint pain and neck pain. Negative for myalgias.  Skin: Negative for itching and rash.  Neurological: Positive for weakness. Negative for tremors and focal weakness.  Endo/Heme/Allergies: Negative.   Psychiatric/Behavioral: Negative for depression.  All other systems reviewed and are negative.  Otherwise per HPI.  Assessment & Plan: Visit Diagnoses:  1. Chronic left shoulder pain   2. Chronic bilateral low back pain with bilateral sciatica   3. Primary osteoarthritis, left shoulder   4. Cervicalgia   5. Post laminectomy syndrome   6. Congenital spondylolysis   7. Spondylolisthesis of lumbar region     Plan: Findings:  Patient returns after successful intra-articular injection of his left glenohumeral joint with good range of motion increasing good symptomatic relief. He obviously can have this injection repeated from time to time depending on how much relief. We've given him some exercises to do for the shoulder. We've talked about him following up with Paul [name] in our office potentially for his shoulder. Patient is very troubled by injections in general. Even with that I am for a fairly simple intra-articular injection he had  a lot of difficulty. He might do well with a diagnostic cervical injection but there is probably no way to get that done safely. We cannot use heavy sedation particularly the cervical spine. Light sedation I think he would still move quite a bit. I don't think it is necessarily a big deal reviewing his cervical MRI  does only shows some by foraminal narrowing at C3-for with some facet arthritis but I think a lot of his pain is myofascial. He has significant anxiety issues and I think he would do well with a good pain psychologist with someone to help with coping strategies etc. Acute do well with exercise program and therapy. All of these we talked about. I unfortunately do not know any good pain psychologist in the area. I think the patient needs to follow-up with Paul [name] with his complaints of 4 months of general malaise and lack of energy. He may need blood work and/or evaluation for possible SSRI. He may be a good candidate for something like duloxetine which may help with some pain relief as well. Given his medical history or probably let Paul [name].   Briefly of note on his lumbar spine. X-rays do show spondylolisthesis with spondylolysis. Without MRI is hard to know if he has any stenosis. This is not really his biggest complaint at this point. Depending on if any radicular symptoms declare themselves the MRI of the lumbar spine would be beneficial. Unfortunately the treatment of choice would be probably injection again if he just does not want to really look at that. If it was problematic he would be a surgical candidate which would unfortunately probably decompression and fusion.    Meds & Orders: No orders of the defined types were placed in this encounter.  No orders of the defined types were placed in this encounter.   Follow-up: Return for [facility] follow up with Paul [name].   Procedures: No procedures performed  No notes on file   Clinical History: MRI CERVICAL SPINE WITHOUT CONTRAST  TECHNIQUE: Multiplanar, multisequence MR imaging of the cervical spine was performed. No intravenous contrast was administered.  COMPARISON:  Cervical spine radiograph 07/24/2012  Cervical spine MRI 07/24/2012  FINDINGS: Alignment: Normal  Vertebrae: C4-C6 ACDF.  No acute abnormality.  Cord:  Normal signal and caliber.  Posterior Fossa, vertebral arteries, paraspinal tissues: Negative.  Disc levels:  C1-C2: Normal.  C2-C3: Normal disc space and facets. No spinal canal or neuroforaminal stenosis.  C3-C4: Bilateral uncovertebral hypertrophy with moderate neural foraminal stenosis. Mild central spinal canal stenosis. This level is unchanged.  C4-C5: Postsurgical changes with wide patency of the thecal sac. No foraminal stenosis.  C5-C6: Postsurgical changes with widely patent spinal canal. Mild narrowing of the left neural foramen.  C6-C7: Small central disc protrusion narrows the ventral thecal sac. Mild spinal canal stenosis. No neural foraminal stenosis.  C7-T1: Normal disc space and facets. No spinal canal or neuroforaminal stenosis.  IMPRESSION: 1. Moderate bilateral neural foraminal stenosis and mild spinal canal stenosis at C3-C4, unchanged. 2. C4-C6 ACDF with widely patent spinal canal.   Electronically Signed   By: Ulyses Jarred M.D.   On: 11/09/2016 21:46  He reports that he has been smoking Cigarettes.  He has a 40.00 pack-year smoking history. He has never used smokeless tobacco. No results for input(s): HGBA1C, LABURIC in the last 8760 hours.  Objective:  VS:  HT:    WT:   BMI:     BP:140/78  HR:62bpm  TEMP: ( )  RESP:  Physical Exam  Constitutional: He is oriented to person, place, and time. He appears well-developed and well-nourished. No distress.  HENT:  Head: Normocephalic and atraumatic.  Nose: Nose normal.  Mouth/Throat: Oropharynx is clear and moist.  Eyes: Pupils are equal, round, and reactive to light. Conjunctivae are normal.  Neck: Neck supple. No tracheal deviation present.  Decreased range of motion with extension and rotation at end ranges  Cardiovascular: Regular rhythm and intact distal pulses.   Pulmonary/Chest: Effort normal and breath sounds normal.  Abdominal: Soft. He exhibits no distension. There is no  guarding.  Musculoskeletal: He exhibits no deformity.  Patient sits with a forward flexed cervical spine. He does have pain with extension but a negative Spurling's test bilaterally. He has pain with rotation at end ranges. He does have trigger points to a degree in the trapezius and levator scapula. These generalized nonspecific. He still has some mild shoulder impingement on the left with better range of motion. He has good upper extremity strength bilaterally particularly with wrist extension long finger flexion and APB. Examination of the lumbar spine shows pain with extension rotation of the lumbar spine. He does ambulate without aid. He has good distal strength.  Lymphadenopathy:    He has no cervical adenopathy.  Neurological: He is alert and oriented to person, place, and time. He exhibits normal muscle tone. Coordination normal.  Skin: Skin is warm. No rash noted.  Psychiatric: He has a normal mood and affect. His behavior is normal.  Nursing note and vitals reviewed.   Ortho Exam Imaging: No results found.  Past Medical/Family/Surgical/Social History: Medications & Allergies reviewed per EMR Patient Active Problem List   Diagnosis Date Noted  . OSA on CPAP 10/10/2016  . OSA and COPD overlap syndrome ([place]) 03/02/2016  . Bronchitis, chronic obstructive, with exacerbation ([place]) 03/02/2016  . Hypoxemia 03/02/2016  . Dependence on supplemental oxygen 03/02/2016  . CAD, multiple vessel 03/02/2016  . Gastroesophageal reflux disease 03/02/2016  . HTN (hypertension), malignant 03/02/2016  . Cigarette smoker 02/22/2014  . DOE (dyspnea on exertion) 01/20/2014  . Obesity (BMI 30.0-34.9)   . Essential hypertension 11/24/2011  . Hyperlipidemia with target LDL less than 70 11/24/2011  . OSA (obstructive sleep apnea), uses oxygen at home did not tolerate cpap 11/24/2011  . COPD GOLD I  11/24/2011  . CAD S/P percutaneous coronary angioplasty -- D1, Xience Xpedition DES 2.5 mm x 33 mm  11/24/2011   Past Medical History:  Diagnosis Date  . CAD S/P percutaneous coronary angioplasty -- D1, Xience Xpedition DES 2.5 mm x 33 mm 11/24/2011   a) Mild-to-moderate 30-40% lesions in the RCA, LAD and Circumflex. b) CULPRIT LESION: long tubular 70-80% lesion in D1 with FFR of 0.7 --> PCI w/ Xience Xpedition DES 2.5 mm x 30 mm (2.65 MM); c) Lexiscan Myoview 11/2013: No Ischemia or Infarct (Inferior Gut Attenuation) EF 63%.  Marland Kitchen COPD (chronic obstructive pulmonary disease) ([place])    "don't have full case of it; I'm right there at it"  . Essential hypertension 11/24/2011  . Exertional dyspnea, chronic   . Gout   . History of Unstable angina 11/24/2011   Referred for cardiac catheterization  . Hyperlipidemia with target LDL less than 70 11/24/2011  . Obesity (BMI 30.0-34.9)   . OSA (obstructive sleep apnea), uses oxygen at home did not tolerate cpap 11/24/2011   Family History  Problem Relation Age of Onset  . Heart disease Sister   . Heart attack Brother   . Emphysema Father  smoked  . Aneurysm Father    Past Surgical History:  Procedure Laterality Date  . CERVICAL SPINE SURGERY  2012  . CORONARY ANGIOPLASTY WITH STENT PLACEMENT  11/23/2011   "1; first one"  . LEFT HEART CATHETERIZATION WITH CORONARY ANGIOGRAM N/A 11/23/2011   Procedure: LEFT HEART CATHETERIZATION WITH CORONARY ANGIOGRAM;  Surgeon: Leonie Man, MD;  Location: [facility] CATH LAB;  Service: Cardiovascular;  Laterality: N/A;   Social History   Occupational History  . Not on file.   Social History Main Topics  . Smoking status: Current Some Day Smoker    Packs/day: 1.00    Years: 40.00    Types: Cigarettes  . Smokeless tobacco: Never Used     Comment: 02/18/14- smokes occ cig maybe 3 x per wk  . Alcohol use 0.0 oz/week     Comment: social 2-3 drink a week  . Drug use: No  . Sexual activity: Not Currently

## 2017-01-15 DIAGNOSIS — Z23 Encounter for immunization: Secondary | ICD-10-CM | POA: Diagnosis not present

## 2017-01-15 DIAGNOSIS — Z6831 Body mass index (BMI) 31.0-31.9, adult: Secondary | ICD-10-CM | POA: Diagnosis not present

## 2017-01-15 DIAGNOSIS — G4733 Obstructive sleep apnea (adult) (pediatric): Secondary | ICD-10-CM | POA: Diagnosis not present

## 2017-01-15 DIAGNOSIS — J449 Chronic obstructive pulmonary disease, unspecified: Secondary | ICD-10-CM | POA: Diagnosis not present

## 2017-01-15 DIAGNOSIS — M5440 Lumbago with sciatica, unspecified side: Secondary | ICD-10-CM | POA: Insufficient documentation

## 2017-01-15 DIAGNOSIS — M545 Low back pain, unspecified: Secondary | ICD-10-CM | POA: Insufficient documentation

## 2017-01-15 DIAGNOSIS — M109 Gout, unspecified: Secondary | ICD-10-CM | POA: Diagnosis not present

## 2017-01-15 DIAGNOSIS — R5383 Other fatigue: Secondary | ICD-10-CM | POA: Diagnosis not present

## 2017-01-16 ENCOUNTER — Other Ambulatory Visit: Payer: Self-pay | Admitting: Internal Medicine

## 2017-01-16 DIAGNOSIS — M545 Low back pain, unspecified: Secondary | ICD-10-CM

## 2017-01-22 DIAGNOSIS — M545 Low back pain: Secondary | ICD-10-CM | POA: Diagnosis not present

## 2017-01-24 DIAGNOSIS — M545 Low back pain: Secondary | ICD-10-CM | POA: Diagnosis not present

## 2017-01-29 DIAGNOSIS — M545 Low back pain: Secondary | ICD-10-CM | POA: Diagnosis not present

## 2017-01-31 DIAGNOSIS — M545 Low back pain: Secondary | ICD-10-CM | POA: Diagnosis not present

## 2017-02-05 DIAGNOSIS — M545 Low back pain: Secondary | ICD-10-CM | POA: Diagnosis not present

## 2017-02-07 DIAGNOSIS — M545 Low back pain: Secondary | ICD-10-CM | POA: Diagnosis not present

## 2017-02-09 ENCOUNTER — Ambulatory Visit
Admission: RE | Admit: 2017-02-09 | Discharge: 2017-02-09 | Disposition: A | Discharge status: Home / Self Care | Payer: Medicare Other | Source: Ambulatory Visit | Attending: Internal Medicine | Admitting: Internal Medicine

## 2017-02-09 DIAGNOSIS — M48061 Spinal stenosis, lumbar region without neurogenic claudication: Secondary | ICD-10-CM | POA: Diagnosis not present

## 2017-02-09 DIAGNOSIS — M545 Low back pain, unspecified: Secondary | ICD-10-CM

## 2017-02-13 DIAGNOSIS — M545 Low back pain: Secondary | ICD-10-CM | POA: Diagnosis not present

## 2017-02-15 DIAGNOSIS — M545 Low back pain: Secondary | ICD-10-CM | POA: Diagnosis not present

## 2017-02-21 DIAGNOSIS — M545 Low back pain: Secondary | ICD-10-CM | POA: Diagnosis not present

## 2017-02-22 DIAGNOSIS — M4310 Spondylolisthesis, site unspecified: Secondary | ICD-10-CM | POA: Diagnosis not present

## 2017-02-22 DIAGNOSIS — M4300 Spondylolysis, site unspecified: Secondary | ICD-10-CM | POA: Diagnosis not present

## 2017-02-22 DIAGNOSIS — M5416 Radiculopathy, lumbar region: Secondary | ICD-10-CM | POA: Diagnosis not present

## 2017-02-22 DIAGNOSIS — M546 Pain in thoracic spine: Secondary | ICD-10-CM | POA: Diagnosis not present

## 2017-02-22 DIAGNOSIS — M47816 Spondylosis without myelopathy or radiculopathy, lumbar region: Secondary | ICD-10-CM | POA: Diagnosis not present

## 2017-02-22 DIAGNOSIS — M5136 Other intervertebral disc degeneration, lumbar region: Secondary | ICD-10-CM | POA: Diagnosis not present

## 2017-02-22 DIAGNOSIS — M5440 Lumbago with sciatica, unspecified side: Secondary | ICD-10-CM | POA: Diagnosis not present

## 2017-02-22 DIAGNOSIS — Q762 Congenital spondylolisthesis: Secondary | ICD-10-CM | POA: Insufficient documentation

## 2017-02-22 DIAGNOSIS — Z6831 Body mass index (BMI) 31.0-31.9, adult: Secondary | ICD-10-CM | POA: Diagnosis not present

## 2017-02-22 DIAGNOSIS — I10 Essential (primary) hypertension: Secondary | ICD-10-CM | POA: Diagnosis not present

## 2017-03-20 DIAGNOSIS — M545 Low back pain: Secondary | ICD-10-CM | POA: Diagnosis not present

## 2017-03-20 DIAGNOSIS — R7309 Other abnormal glucose: Secondary | ICD-10-CM | POA: Diagnosis not present

## 2017-03-20 DIAGNOSIS — I10 Essential (primary) hypertension: Secondary | ICD-10-CM | POA: Diagnosis not present

## 2017-03-20 DIAGNOSIS — G4733 Obstructive sleep apnea (adult) (pediatric): Secondary | ICD-10-CM | POA: Diagnosis not present

## 2017-03-20 DIAGNOSIS — Z6831 Body mass index (BMI) 31.0-31.9, adult: Secondary | ICD-10-CM | POA: Diagnosis not present

## 2017-04-27 DIAGNOSIS — M47816 Spondylosis without myelopathy or radiculopathy, lumbar region: Secondary | ICD-10-CM | POA: Diagnosis not present

## 2017-04-27 DIAGNOSIS — M48062 Spinal stenosis, lumbar region with neurogenic claudication: Secondary | ICD-10-CM | POA: Diagnosis not present

## 2017-04-27 DIAGNOSIS — Z6833 Body mass index (BMI) 33.0-33.9, adult: Secondary | ICD-10-CM | POA: Diagnosis not present

## 2017-04-27 DIAGNOSIS — Q762 Congenital spondylolisthesis: Secondary | ICD-10-CM | POA: Diagnosis not present

## 2017-04-27 DIAGNOSIS — M5440 Lumbago with sciatica, unspecified side: Secondary | ICD-10-CM | POA: Diagnosis not present

## 2017-04-27 DIAGNOSIS — M4300 Spondylolysis, site unspecified: Secondary | ICD-10-CM | POA: Diagnosis not present

## 2017-04-27 DIAGNOSIS — I10 Essential (primary) hypertension: Secondary | ICD-10-CM | POA: Diagnosis not present

## 2017-06-19 DIAGNOSIS — I10 Essential (primary) hypertension: Secondary | ICD-10-CM | POA: Diagnosis not present

## 2017-06-19 DIAGNOSIS — E7849 Other hyperlipidemia: Secondary | ICD-10-CM | POA: Diagnosis not present

## 2017-06-19 DIAGNOSIS — R7309 Other abnormal glucose: Secondary | ICD-10-CM | POA: Diagnosis not present

## 2017-06-19 DIAGNOSIS — Z125 Encounter for screening for malignant neoplasm of prostate: Secondary | ICD-10-CM | POA: Diagnosis not present

## 2017-06-19 DIAGNOSIS — M109 Gout, unspecified: Secondary | ICD-10-CM | POA: Diagnosis not present

## 2017-06-22 DIAGNOSIS — Z0000 Encounter for general adult medical examination without abnormal findings: Secondary | ICD-10-CM | POA: Diagnosis not present

## 2017-06-22 DIAGNOSIS — Z1389 Encounter for screening for other disorder: Secondary | ICD-10-CM | POA: Diagnosis not present

## 2017-06-22 DIAGNOSIS — M545 Low back pain: Secondary | ICD-10-CM | POA: Diagnosis not present

## 2017-06-22 DIAGNOSIS — Z6832 Body mass index (BMI) 32.0-32.9, adult: Secondary | ICD-10-CM | POA: Diagnosis not present

## 2017-06-22 DIAGNOSIS — I7389 Other specified peripheral vascular diseases: Secondary | ICD-10-CM | POA: Diagnosis not present

## 2017-06-22 DIAGNOSIS — R05 Cough: Secondary | ICD-10-CM | POA: Diagnosis not present

## 2017-06-22 DIAGNOSIS — I10 Essential (primary) hypertension: Secondary | ICD-10-CM | POA: Diagnosis not present

## 2017-06-22 DIAGNOSIS — I739 Peripheral vascular disease, unspecified: Secondary | ICD-10-CM | POA: Insufficient documentation

## 2017-06-22 DIAGNOSIS — E668 Other obesity: Secondary | ICD-10-CM | POA: Diagnosis not present

## 2017-06-22 DIAGNOSIS — E1151 Type 2 diabetes mellitus with diabetic peripheral angiopathy without gangrene: Secondary | ICD-10-CM | POA: Diagnosis not present

## 2017-06-22 DIAGNOSIS — E7849 Other hyperlipidemia: Secondary | ICD-10-CM | POA: Diagnosis not present

## 2017-06-22 DIAGNOSIS — F3289 Other specified depressive episodes: Secondary | ICD-10-CM | POA: Diagnosis not present

## 2017-06-22 DIAGNOSIS — J449 Chronic obstructive pulmonary disease, unspecified: Secondary | ICD-10-CM | POA: Diagnosis not present

## 2017-06-22 DIAGNOSIS — Z9861 Coronary angioplasty status: Secondary | ICD-10-CM | POA: Diagnosis not present

## 2017-06-26 DIAGNOSIS — Z1212 Encounter for screening for malignant neoplasm of rectum: Secondary | ICD-10-CM | POA: Diagnosis not present

## 2017-09-26 ENCOUNTER — Telehealth (INDEPENDENT_AMBULATORY_CARE_PROVIDER_SITE_OTHER): Payer: Self-pay | Admitting: Physical Medicine and Rehabilitation

## 2017-09-26 NOTE — Telephone Encounter (Signed)
Patient came into the clinic stating that his shoulder is bothering him again and that he would like to schedule another injection.  CB#[phone].  Thank you.

## 2017-09-26 NOTE — Telephone Encounter (Signed)
Left shoulder injection 12/08/16. Ok to repeat?

## 2017-09-27 NOTE — Telephone Encounter (Signed)
Pt is scheduled for 7/11 @ 1530

## 2017-09-27 NOTE — Telephone Encounter (Signed)
Ok to repeat if nothing new, he needs valium pre-procedure - send me message back to remind me

## 2017-10-10 ENCOUNTER — Ambulatory Visit: Payer: Medicare Other | Admitting: Neurology

## 2017-10-12 DIAGNOSIS — Z9861 Coronary angioplasty status: Secondary | ICD-10-CM | POA: Diagnosis not present

## 2017-10-12 DIAGNOSIS — Z6830 Body mass index (BMI) 30.0-30.9, adult: Secondary | ICD-10-CM | POA: Diagnosis not present

## 2017-10-12 DIAGNOSIS — J449 Chronic obstructive pulmonary disease, unspecified: Secondary | ICD-10-CM | POA: Diagnosis not present

## 2017-10-12 DIAGNOSIS — R51 Headache: Secondary | ICD-10-CM | POA: Diagnosis not present

## 2017-10-12 DIAGNOSIS — I10 Essential (primary) hypertension: Secondary | ICD-10-CM | POA: Diagnosis not present

## 2017-10-12 DIAGNOSIS — I7389 Other specified peripheral vascular diseases: Secondary | ICD-10-CM | POA: Diagnosis not present

## 2017-10-12 DIAGNOSIS — E1151 Type 2 diabetes mellitus with diabetic peripheral angiopathy without gangrene: Secondary | ICD-10-CM | POA: Diagnosis not present

## 2017-10-15 ENCOUNTER — Ambulatory Visit: Payer: Medicare Other | Admitting: Neurology

## 2017-10-23 ENCOUNTER — Ambulatory Visit: Payer: Medicare Other | Admitting: Neurology

## 2017-10-25 ENCOUNTER — Encounter (INDEPENDENT_AMBULATORY_CARE_PROVIDER_SITE_OTHER): Payer: Self-pay | Admitting: Physical Medicine and Rehabilitation

## 2017-10-25 ENCOUNTER — Ambulatory Visit (INDEPENDENT_AMBULATORY_CARE_PROVIDER_SITE_OTHER): Payer: Self-pay

## 2017-10-25 ENCOUNTER — Ambulatory Visit (INDEPENDENT_AMBULATORY_CARE_PROVIDER_SITE_OTHER): Payer: Medicare Other | Admitting: Physical Medicine and Rehabilitation

## 2017-10-25 DIAGNOSIS — M961 Postlaminectomy syndrome, not elsewhere classified: Secondary | ICD-10-CM

## 2017-10-25 DIAGNOSIS — M25512 Pain in left shoulder: Secondary | ICD-10-CM | POA: Diagnosis not present

## 2017-10-25 DIAGNOSIS — M542 Cervicalgia: Secondary | ICD-10-CM

## 2017-10-25 DIAGNOSIS — G8929 Other chronic pain: Secondary | ICD-10-CM

## 2017-10-25 NOTE — Progress Notes (Signed)
.  Numeric Pain Rating Scale and Functional Assessment Average Pain 5   In the last MONTH (on 0-10 scale) has pain interfered with the following?  1. General activity like being  able to carry out your everyday physical activities such as walking, climbing stairs, carrying groceries, or moving a chair?  Rating(1)   -Dye Allergies.

## 2017-10-25 NOTE — Progress Notes (Signed)
Paul [name] - 67 y.o. male MRN [id number]  Date of birth: Apr 14, 1951  Office Visit Note: Visit Date: 10/25/2017 PCP: Leanna Battles, MD Referred by: Leanna Battles, MD  Subjective: Chief Complaint  Patient presents with  . Left Shoulder - Pain  . Left Upper Arm - Pain   HPI: Paul [name] is a pleasant but complicated 67 year old gentleman that I saw in August of last year for intra-articular injection of the left shoulder with good relief of his symptoms for several months.  He has had chronic neck pain and referral into the shoulder.  He has had prior ACDF.  MRI of the cervical spine is not very impressive although there is some foraminal narrowing.  I think a lot of his pain is intra-articular shoulder pain and this can refer in the arm to a degree.  I think he also has myofascial trigger points.  He does not do well with injections at all and is very anxious.  We give him preprocedure Valium.  If it did come down to cervical epidural injection I think it might need to be done at a surgery center with more sedation.  I would likely refer this to Dr. Nelva Bush at that time since were not doing very many injections at the surgery center at this point.  His symptoms today are worse with shoulder movement and exam is consistent with intra-articular shoulder issue.   ROS Otherwise per HPI.  Assessment & Plan: Visit Diagnoses:  1. Chronic left shoulder pain   2. Cervicalgia   3. Post laminectomy syndrome     Plan: No additional findings.   Meds & Orders: No orders of the defined types were placed in this encounter.   Orders Placed This Encounter  Procedures  . Large Joint Inj: L glenohumeral  . XR C-ARM NO REPORT    Follow-up: Return if symptoms worsen or fail to improve.   Procedures: Large Joint Inj: L glenohumeral on 10/25/2017 3:50 PM Indications: pain and diagnostic evaluation Details: 22 G 3.5 in needle, anteromedial approach  Arthrogram: Yes  Medications: 80 mg triamcinolone  acetonide 40 MG/ML; 3 mL bupivacaine 0.5 %  Arthrogram demonstrated excellent flow of contrast throughout the joint surface without extravasation or obvious defect.  The patient had relief of symptoms during the anesthetic phase of the injection.  Procedure, treatment alternatives, risks and benefits explained, specific risks discussed. Consent was given by the patient. Immediately prior to procedure a time out was called to verify the correct patient, procedure, equipment, support staff and site/side marked as required. Patient was prepped and draped in the usual sterile fashion.      No notes on file   Clinical History: MRI CERVICAL SPINE WITHOUT CONTRAST  TECHNIQUE: Multiplanar, multisequence MR imaging of the cervical spine was performed. No intravenous contrast was administered.  COMPARISON:  Cervical spine radiograph 07/24/2012  Cervical spine MRI 07/24/2012  FINDINGS: Alignment: Normal  Vertebrae: C4-C6 ACDF.  No acute abnormality.  Cord: Normal signal and caliber.  Posterior Fossa, vertebral arteries, paraspinal tissues: Negative.  Disc levels:  C1-C2: Normal.  C2-C3: Normal disc space and facets. No spinal canal or neuroforaminal stenosis.  C3-C4: Bilateral uncovertebral hypertrophy with moderate neural foraminal stenosis. Mild central spinal canal stenosis. This level is unchanged.  C4-C5: Postsurgical changes with wide patency of the thecal sac. No foraminal stenosis.  C5-C6: Postsurgical changes with widely patent spinal canal. Mild narrowing of the left neural foramen.  C6-C7: Small central disc protrusion narrows the ventral thecal sac. Mild  spinal canal stenosis. No neural foraminal stenosis.  C7-T1: Normal disc space and facets. No spinal canal or neuroforaminal stenosis.  IMPRESSION: 1. Moderate bilateral neural foraminal stenosis and mild spinal canal stenosis at C3-C4, unchanged. 2. C4-C6 ACDF with widely patent spinal  canal.   Electronically Signed   By: Ulyses Jarred M.D.   On: 11/09/2016 21:46   He reports that he has been smoking cigarettes.  He has a 40.00 pack-year smoking history. He has never used smokeless tobacco. No results for input(s): HGBA1C, LABURIC in the last 8760 hours.  Objective:  VS:  HT:    WT:   BMI:     BP:   HR: bpm  TEMP: ( )  RESP:  Physical Exam  Ortho Exam Imaging: No results found.  Past Medical/Family/Surgical/Social History: Medications & Allergies reviewed per EMR, new medications updated. Patient Active Problem List   Diagnosis Date Noted  . Corneal irritation of both eyes 10/31/2017  . Allergic rhinitis due to animal hair and dander 10/31/2017  . OSA on CPAP 10/10/2016  . OSA and COPD overlap syndrome ([place]) 03/02/2016  . Bronchitis, chronic obstructive, with exacerbation ([place]) 03/02/2016  . Hypoxemia 03/02/2016  . Dependence on supplemental oxygen 03/02/2016  . CAD, multiple vessel 03/02/2016  . Gastroesophageal reflux disease 03/02/2016  . HTN (hypertension), malignant 03/02/2016  . Cigarette smoker 02/22/2014  . DOE (dyspnea on exertion) 01/20/2014  . Obesity (BMI 30.0-34.9)   . Essential hypertension 11/24/2011  . Hyperlipidemia with target LDL less than 70 11/24/2011  . OSA (obstructive sleep apnea), uses oxygen at home did not tolerate cpap 11/24/2011  . COPD GOLD I  11/24/2011  . CAD S/P percutaneous coronary angioplasty -- D1, Xience Xpedition DES 2.5 mm x 33 mm 11/24/2011   Past Medical History:  Diagnosis Date  . CAD S/P percutaneous coronary angioplasty -- D1, Xience Xpedition DES 2.5 mm x 33 mm 11/24/2011   a) Mild-to-moderate 30-40% lesions in the RCA, LAD and Circumflex. b) CULPRIT LESION: long tubular 70-80% lesion in D1 with FFR of 0.7 --> PCI w/ Xience Xpedition DES 2.5 mm x 30 mm (2.65 MM); c) Lexiscan Myoview 11/2013: No Ischemia or Infarct (Inferior Gut Attenuation) EF 63%.  Marland Kitchen COPD (chronic obstructive pulmonary disease) ([place])     "don't have full case of it; I'm right there at it"  . Essential hypertension 11/24/2011  . Exertional dyspnea, chronic   . Gout   . History of Unstable angina 11/24/2011   Referred for cardiac catheterization  . Hyperlipidemia with target LDL less than 70 11/24/2011  . Obesity (BMI 30.0-34.9)   . OSA (obstructive sleep apnea), uses oxygen at home did not tolerate cpap 11/24/2011   Family History  Problem Relation Age of Onset  . Heart disease Sister   . Heart attack Brother   . Emphysema Father        smoked  . Aneurysm Father    Past Surgical History:  Procedure Laterality Date  . CERVICAL SPINE SURGERY  2012  . CORONARY ANGIOPLASTY WITH STENT PLACEMENT  11/23/2011   "1; first one"  . LEFT HEART CATHETERIZATION WITH CORONARY ANGIOGRAM N/A 11/23/2011   Procedure: LEFT HEART CATHETERIZATION WITH CORONARY ANGIOGRAM;  Surgeon: Leonie Man, MD;  Location: [facility] CATH LAB;  Service: Cardiovascular;  Laterality: N/A;   Social History   Occupational History  . Not on file  Tobacco Use  . Smoking status: Current Some Day Smoker    Packs/day: 1.00    Years: 40.00  Pack years: 40.00    Types: Cigarettes  . Smokeless tobacco: Never Used  . Tobacco comment: 02/18/14- smokes occ cig maybe 3 x per wk  Substance and Sexual Activity  . Alcohol use: Yes    Alcohol/week: 0.0 oz    Comment: social 2-3 drink a week  . Drug use: No  . Sexual activity: Not Currently

## 2017-10-25 NOTE — Patient Instructions (Signed)

## 2017-10-28 ENCOUNTER — Encounter: Payer: Self-pay | Admitting: Neurology

## 2017-10-31 ENCOUNTER — Encounter: Payer: Self-pay | Admitting: Neurology

## 2017-10-31 ENCOUNTER — Telehealth: Payer: Self-pay | Admitting: Neurology

## 2017-10-31 ENCOUNTER — Ambulatory Visit (INDEPENDENT_AMBULATORY_CARE_PROVIDER_SITE_OTHER): Payer: Medicare Other | Admitting: Neurology

## 2017-10-31 VITALS — BP 152/80 | HR 59 | Ht 66.0 in | Wt 178.0 lb

## 2017-10-31 DIAGNOSIS — H2189 Other specified disorders of iris and ciliary body: Secondary | ICD-10-CM

## 2017-10-31 DIAGNOSIS — J449 Chronic obstructive pulmonary disease, unspecified: Secondary | ICD-10-CM | POA: Diagnosis not present

## 2017-10-31 DIAGNOSIS — G4733 Obstructive sleep apnea (adult) (pediatric): Secondary | ICD-10-CM

## 2017-10-31 DIAGNOSIS — Z9989 Dependence on other enabling machines and devices: Secondary | ICD-10-CM

## 2017-10-31 DIAGNOSIS — J3081 Allergic rhinitis due to animal (cat) (dog) hair and dander: Secondary | ICD-10-CM | POA: Insufficient documentation

## 2017-10-31 DIAGNOSIS — J309 Allergic rhinitis, unspecified: Secondary | ICD-10-CM | POA: Insufficient documentation

## 2017-10-31 DIAGNOSIS — H18893 Other specified disorders of cornea, bilateral: Secondary | ICD-10-CM | POA: Insufficient documentation

## 2017-10-31 NOTE — Patient Instructions (Signed)

## 2017-10-31 NOTE — Progress Notes (Signed)
SLEEP MEDICINE CLINIC   Provider:  Larey Seat, M D  Referring Provider: Leanna Battles, MD Primary Care Physician:  Leanna Battles, MD  Chief Complaint  Patient presents with  . Follow-up    pt alone, rm 11. pt states machine is working well but his mask tends to blow air in eyes. he states that he has a hard time sleeping with his mask not all the time. DME [facility]    HPI: interval history from 10-31-2017 for Paul [name], a caucasian 67 year old male with facial hair and a chief complaint of air leaking form FFM into eyes, causing irritation and ear pressure. He sleeps alone.  The patient was diagnosed in December 2017 with mild apnea at a baseline AHI of 13.1, during REM sleep exacerbated to 61.9.  He did have prolonged oxygen desaturations.  He continued to use oxygen at home which she had previously used with a nasal cannula.  He was titrated to 9 cm water pressure on CPAP in January 2018, today's compliance shows 97% 4 days and 85% 4 hours with an average of 6 hours 52 minutes at night CPAP is still set at 9 cm water pressure with 3 cm EPR and a residual AHI of only 0.4.  But his apneas are very well controlled he clearly has a lot of air leakage.  The 95th percentile air leak was 54.6 L/min based on this I think he needs to be refitted for a different interface.  A full facemask may not work with facial hair.  He endorsed the Epworth Sleepiness Scale at 8 points, fatigue severity score today at 37 points, and the geriatric depression score at 4 out of 15 points, not indicating depression to be present.     Interval history from 10/10/2016. I have the pleasure of seeing Paul [name] today 7 months after I initially had a consultation with him. The patient underwent a baseline polysomnography on 03/21/2016 which resulted in an AHI of 13.1, a rather mild apnea, which was strongly accentuated during rapid eye movement sleep. During REM sleep the AHI was 61.9 per hour and the  patient experienced prolonged periods of hypoxemia for a total desaturation time of 249 minutes. Given this comorbidity of possible COPD CPAP was deemed the only treatment option. The patient was placed on CPAP in a study on 04/25/2016 and titrated to 9 cm water pressure with the help of 1 L of oxygen. He continues to use oxygen at home he was fitted with a full face mask F 20 in large size.   The patient's CPAP  shows excellent compliance of 100% for the last 30 days with an average of 5 hours and 58 minutes nightly use set pressure at 9 cm with 3 cm EPR and a residual AHI of only 0.2 apneas per hour of sleep. As mentioned above, CPAP is supposed to be used with oxygen. He reports he has not been told to use CPAP alongside oxygen.  [facility].  Epworth sleepiness score is now endorsed at 3 points. The patient endorsed the geriatric depression score at only 2 out of 15 points not indicative for depression to be present.      Paul [name] is a 67 y.o. male , seen here as a referral  from Dr. Philip Aspen for a sleep consultation, Paul [name] reports that he underwent a sleep study at  [facility],  which must have resulted in the diagnosis of obstructive sleep apnea as he  was asked to start using CPAP.  He could not tolerate CPAP he stated and as alternative therapy oxygen was ordered. Apparently, he has been on oxygen ever since but he does not feel that this improves his sleep pattern, his  sleep quality. He continues to be a daily smoker has smoked for well over 50 years, has COPD as well as always a, he drinks 3-4 caffeinated beverages a day, does drink hard liquor but not daily. He has very high blood pressures which were confirmed and his last primary care visit with Dr. Bevelyn Buckles on 02/08/2016, when e presented with 192/112 blood pressure. At the same visit he reported balance problems, and had fallen the night before.  He was diagnosed with benign positional vertigo,  but also esophageal reflux disease, hyperlipidemia, chronic insomnia with noncompliance with CPAP, untreated obstructive sleep apnea, cervical spine degenerative disc disease, chronic pain of the left shoulder, gout, obesity, hypertension, coronary artery disease with a heart attack in 2014, followed by Dr. Christinia Gully- pulmonary,  Glenetta Hew ,cardiology -[facility], neurosurgery.  Chief complaint according to patient : "I cannot sleep , I cough"   Sleep habits are as follows: The patient endorses a usual bedtime at 11 PM, the bedroom is dark, cool and quiet. He usually sleeps on his back most of the time, props up on one pillow.  He reports he cannot sleep in a recliner in a seated position. He sleeps alone. He has been told many times that he snores loudly and that he stops breathing in his sleep. He is up to 3 times waking for bathroom breaks each night, the last one at 5 or 6 AM. He does not report vivid dreams, nightmares, sleepwalking, night terrors, enuresis, sleep talking or sleep eating. He usually arises around 7 AM, his entrained pattern from his working days was to rise at 4 AM to get ready for work. He is now retired 5 years and his sleep pattern has changed. He wakes up spontaneously around 7, takes breakfast at home. He takes naps on week ends, 15 minutes only.   Sleep medical history and family sleep history:  No tonsil or adenoid ectomy, anterior fusion of the neck with Dr. Luiz Ochoa in 2010, no history of traumatic brain injuries, concussions or contusions. No psychiatric history. Remarkable is his family history one brother expired at age 37 after myocardial infarction and sepsis, 2 sisters; one healthy 1 has COPD, coronary artery disease, and strokes, he also has 2 children both are healthy ( born when he was 24 and 67 years old) .    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 He drinks vodka 2 a week , does drink at social occasions, he drinks a couple of coffees in the morning, but more than 3 drinks of caffeinated sodas daily.   Review of Systems: Out of a complete 14 system review, the patient complains of only the following symptoms, and all other reviewed systems are negative.  He endorsed the Epworth Sleepiness Scale at 8 points, fatigue severity score today at 37 points, and the geriatric depression score at 4 out of 15 points, not indicating depression to be  present.    Social History   Socioeconomic History  . Marital status: Divorced    Spouse name: Not on file  . Number of children: Not on file  . Years of education: Not on file  . Highest education level: Not on file  Occupational History  . Not on file  Social Needs  . Financial resource strain: Not on file  . Food insecurity:    Worry: Not on file    Inability: Not on file  . Transportation needs:    Medical: Not on file    Non-medical: Not on file  Tobacco Use  . Smoking status: Current Some Day Smoker    Packs/day: 1.00    Years: 40.00    Pack years: 40.00    Types: Cigarettes  . Smokeless tobacco: Never Used  . Tobacco comment: 02/18/14- smokes occ cig maybe 3 x per wk  Substance and Sexual Activity  . Alcohol use: Yes    Alcohol/week: 0.0 oz    Comment: social 2-3 drink a week  . Drug use: No  . Sexual activity: Not Currently  Lifestyle  . Physical activity:    Days per week: Not on file    Minutes per session: Not on file  . Stress: Not on file  Relationships  . Social connections:    Talks on phone: Not on file    Gets together: Not on file    Attends religious service: Not on file    Active member of club or organization: Not on file    Attends meetings of clubs or organizations:  Not on file    Relationship status: Not on file  . Intimate partner violence:    Fear of current or ex partner: Not on file    Emotionally abused: Not on file    Physically abused: Not on file    Forced sexual activity: Not on file  Other Topics Concern  . Not on file  Social History Narrative   He is a 67 y.o. divorced father of 51, grandfather 1.   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   He quit smoking in August 2013, after smoking a pack a day for roughly 40 years.   He drinks socially 2-3 drinks a week only.   He does not get routine exercise, mostly due to 2 fatigue and dyspnea. Otherwise been relatively sedentary.    Family History  Problem Relation Age of Onset  . Heart disease Sister   . Heart attack Brother   . Emphysema Father        smoked  . Aneurysm Father     Past Medical History:  Diagnosis Date  . CAD S/P percutaneous coronary angioplasty -- D1, Xience Xpedition DES 2.5 mm x 33 mm 11/24/2011   a) Mild-to-moderate 30-40% lesions in the RCA, LAD and Circumflex. b) CULPRIT LESION: long tubular 70-80% lesion in D1 with FFR of 0.7 --> PCI w/ Xience Xpedition DES 2.5 mm x 30 mm (2.65 MM); c) Lexiscan Myoview 11/2013: No Ischemia or Infarct (Inferior Gut Attenuation) EF 63%.  Marland Kitchen COPD (chronic obstructive pulmonary disease) ([place])    "don't have full case of it; I'm right there at it"  . Essential hypertension 11/24/2011  . Exertional dyspnea, chronic   . Gout   . History of Unstable angina 11/24/2011   Referred for cardiac catheterization  . Hyperlipidemia with  target LDL less than 70 11/24/2011  . Obesity (BMI 30.0-34.9)   . OSA (obstructive sleep apnea), uses oxygen at home did not tolerate cpap 11/24/2011    Past Surgical History:  Procedure Laterality Date  . CERVICAL SPINE SURGERY  2012  . CORONARY ANGIOPLASTY WITH STENT PLACEMENT   11/23/2011   "1; first one"  . LEFT HEART CATHETERIZATION WITH CORONARY ANGIOGRAM N/A 11/23/2011   Procedure: LEFT HEART CATHETERIZATION WITH CORONARY ANGIOGRAM;  Surgeon: Leonie Man, MD;  Location: [facility] CATH LAB;  Service: Cardiovascular;  Laterality: N/A;    Current Outpatient Medications  Medication Sig Dispense Refill  . albuterol (PROAIR HFA) 108 (90 BASE) MCG/ACT inhaler Inhale 2 puffs into the lungs every 6 (six) hours as needed for wheezing or shortness of breath.    Marland Kitchen albuterol (PROVENTIL) (2.5 MG/3ML) 0.083% nebulizer solution Take 2.5 mg by nebulization every 6 (six) hours as needed for wheezing or shortness of breath.    . allopurinol (ZYLOPRIM) 300 MG tablet Take 300 mg by mouth daily.    Marland Kitchen amLODipine (NORVASC) 5 MG tablet Take 5 mg by mouth daily.    Marland Kitchen aspirin 325 MG tablet Take 325 mg by mouth daily.    . budesonide-formoterol (SYMBICORT) 160-4.5 MCG/ACT inhaler Take 2 puffs first thing in am and then another 2 puffs about 12 hours later. 1 Inhaler 11  . clopidogrel (PLAVIX) 75 MG tablet Take 1 tablet (75 mg total) by mouth daily. 90 tablet 1  . colchicine (COLCRYS) 0.6 MG tablet Take two tabs by mouth at once then a third tablet one hour later. 3 tablet 0  . cyclobenzaprine (FLEXERIL) 10 MG tablet Take 10 mg by mouth daily.    . diazepam (VALIUM) 5 MG tablet Take 1 by mouth 1 to 2 hours pre-procedure. May repeat if necessary. 2 tablet 0  . meclizine (ANTIVERT) 25 MG tablet Take 25 mg by mouth daily.    . meloxicam (MOBIC) 15 MG tablet Take 15 mg by mouth daily.    . metoprolol tartrate (LOPRESSOR) 25 MG tablet Take 25 mg by mouth daily.    . nortriptyline (PAMELOR) 25 MG capsule Take 25 mg by mouth at bedtime.    Marland Kitchen omeprazole (PRILOSEC) 20 MG capsule Take 20 mg by mouth daily.    Marland Kitchen oxyCODONE-acetaminophen (PERCOCET/ROXICET) 5-325 MG per tablet Take 1 tablet by mouth every 6 (six) hours as needed.  0  . traMADol (ULTRAM) 50 MG tablet Take 1 tablet (50 mg total) by mouth every 6  (six) hours as needed for moderate pain or severe pain. 15 tablet 0  . zolpidem (AMBIEN) 10 MG tablet Take 10 mg by mouth at bedtime.    Marland Kitchen atorvastatin (LIPITOR) 20 MG tablet Take 1 tablet (20 mg total) by mouth daily at 6 PM. 90 tablet 1  . nitroGLYCERIN (NITROSTAT) 0.4 MG SL tablet Place 1 tablet (0.4 mg total) under the tongue every 5 (five) minutes as needed for chest pain. 25 tablet 4   No current facility-administered medications for this visit.     Allergies as of 10/31/2017 - Review Complete 10/31/2017  Allergen Reaction Noted  . Codeine Other (See Comments) 11/22/2011  . Duloxetine hcl  12/04/2013    Vitals: BP (!) 152/80   Pulse (!) 59   Ht 5\' 6"  (1.676 m)   Wt 178 lb (80.7 kg)   BMI 28.73 kg/m  Last Weight:  Wt Readings from Last 1 Encounters:  10/31/17 178 lb (80.7 kg)   XHB:ZJIR mass  index is 28.73 kg/m.     Last Height:   Ht Readings from Last 1 Encounters:  10/31/17 5\' 6"  (1.676 m)    Physical exam:  General: The patient is awake, alert and appears not in acute distress. No longer smoking !!!  Head: Normocephalic, atraumatic. Neck is supple. Mallampati 3,  neck circumference: 16.2 5 . Nasal airflow congested, Retrognathia is not seen. Full dentures.  Cardiovascular:  Regular rate and rhythm , without  murmurs or carotid bruit, and without distended neck veins. Respiratory:  Uses inhaler , chronic coughing , bronchitis, wheezing , rhonchi. Skin:  Spider navi, facial erythema. runk  Neurologic exam :The patient is awake and alert, oriented to place and time.  Speech is fluent,  without  dysarthria, dysphonia or aphasia.  Mood and affect are appropriate.  Cranial nerves:  Taste and smell are blunted- Pupils are equal and briskly reactive to light. Had Cataract surgery bilaterally - eyes are red- irritated ( from airflow)   Visual fields by finger perimetry are intact. Hearing to finger rub bilaterally reduced.  Facial sensation intact to fine touch. Facial  motor strength is symmetric and tongue and uvula move midline. Shoulder shrug was symmetrical.   Deep tendon reflexes: in the  upper and lower extremities are symmetric and intact.   The patient was advised of the nature of the diagnosed sleep disorder , the treatment options and risks for general a health and wellness arising from not treating the condition.  I spent more than 25 minutes of face to face time with the patient. Greater than 50% of time was spent in counseling and coordination of care. I have congratulated him to b have been able to quit smoking! He lost some weight. We have discussed the diagnosis and differential and I answered the patient's questions.     Assessment:  After physical and neurologic examination, review of laboratory studies,  Personal review of imaging studies, reports of other /same  Imaging studies ,  Results of polysomnography/ neurophysiology testing and pre-existing records as far as provided in visit., my assessment is   1) Paul [name] has known obstructive sleep apnea which is overlapping with COPD. He was placed on oxygen only but no longer finds that this allows him to have refreshing and restorative sleep. He has been using CPAP compliantly , but not with oxygen. Was not told how to connect the two. I will contact [facility].  He needs a different interface ( FFM ) as air leakage into the eyes has caused ciliary injection. Ask for refitting, possibly  to a nasal or pillow interface.   2) he has coronary artery disease, has suffered an MI in the past, other comorbidities that make CPAP and oxygen necessary are : COPD, oxygen dependency, CAD, Nocturia, OSA- REM dependent . He feels refreshed and restored after CPAP use.   Rv in 12 month    Paul Partridge Asir Bingley MD  10/31/2017   CC: Leanna Battles, [address], [place] [id number]

## 2017-10-31 NOTE — Telephone Encounter (Signed)
Patient was seen today and at [place] was told to wait behind the check-out sign for patient privacy. I was with a patient at that moment. Patient continued to walk up. I told patient to please wait behind the sign. When I was finished with the patient that I was with, I called patient up and explained to him why we have a sign that expresses the importance of patient confidentiality. Patient made disparaging comments and walked out of the check-out area without scheduling a follow up.

## 2017-11-08 MED ORDER — TRIAMCINOLONE ACETONIDE 40 MG/ML IJ SUSP
80.0000 mg | INTRAMUSCULAR | Status: AC | PRN
  Administered 2017-10-25: 80 mg via INTRA_ARTICULAR

## 2017-11-08 MED ORDER — BUPIVACAINE HCL 0.5 % IJ SOLN
3.0000 mL | INTRAMUSCULAR | Status: AC | PRN
  Administered 2017-10-25: 3 mL via INTRA_ARTICULAR

## 2018-03-12 DIAGNOSIS — E7849 Other hyperlipidemia: Secondary | ICD-10-CM | POA: Diagnosis not present

## 2018-03-12 DIAGNOSIS — G4733 Obstructive sleep apnea (adult) (pediatric): Secondary | ICD-10-CM | POA: Diagnosis not present

## 2018-03-12 DIAGNOSIS — Z9861 Coronary angioplasty status: Secondary | ICD-10-CM | POA: Diagnosis not present

## 2018-03-12 DIAGNOSIS — M109 Gout, unspecified: Secondary | ICD-10-CM | POA: Diagnosis not present

## 2018-03-12 DIAGNOSIS — J449 Chronic obstructive pulmonary disease, unspecified: Secondary | ICD-10-CM | POA: Diagnosis not present

## 2018-03-12 DIAGNOSIS — Z6832 Body mass index (BMI) 32.0-32.9, adult: Secondary | ICD-10-CM | POA: Diagnosis not present

## 2018-03-12 DIAGNOSIS — E1151 Type 2 diabetes mellitus with diabetic peripheral angiopathy without gangrene: Secondary | ICD-10-CM | POA: Diagnosis not present

## 2018-03-12 DIAGNOSIS — I10 Essential (primary) hypertension: Secondary | ICD-10-CM | POA: Diagnosis not present

## 2018-03-12 DIAGNOSIS — F3289 Other specified depressive episodes: Secondary | ICD-10-CM | POA: Diagnosis not present

## 2018-03-20 ENCOUNTER — Telehealth (INDEPENDENT_AMBULATORY_CARE_PROVIDER_SITE_OTHER): Payer: Self-pay | Admitting: Physical Medicine and Rehabilitation

## 2018-03-20 NOTE — Telephone Encounter (Signed)
BI shoulder injection patient request came into the office

## 2018-03-20 NOTE — Telephone Encounter (Signed)
History of left shoulder injections- last was 10/25/17 with Valium. Please advise.

## 2018-03-20 NOTE — Telephone Encounter (Signed)
Ok, remind me about the valium

## 2018-03-22 NOTE — Telephone Encounter (Signed)
Scheduled for 12/12 at 1430. Pharmacy is correct.

## 2018-03-25 ENCOUNTER — Other Ambulatory Visit (INDEPENDENT_AMBULATORY_CARE_PROVIDER_SITE_OTHER): Payer: Self-pay | Admitting: Physical Medicine and Rehabilitation

## 2018-03-25 MED ORDER — DIAZEPAM 5 MG PO TABS: ORAL_TABLET | ORAL | 0 refills | Status: DC

## 2018-03-25 NOTE — Progress Notes (Signed)
Pre-procedure diazepam ordered for pre-operative anxiety.  

## 2018-03-28 ENCOUNTER — Ambulatory Visit (INDEPENDENT_AMBULATORY_CARE_PROVIDER_SITE_OTHER): Payer: Self-pay

## 2018-03-28 ENCOUNTER — Ambulatory Visit (INDEPENDENT_AMBULATORY_CARE_PROVIDER_SITE_OTHER): Payer: Medicare Other | Admitting: Physical Medicine and Rehabilitation

## 2018-03-28 ENCOUNTER — Encounter (INDEPENDENT_AMBULATORY_CARE_PROVIDER_SITE_OTHER): Payer: Self-pay | Admitting: Physical Medicine and Rehabilitation

## 2018-03-28 DIAGNOSIS — M25511 Pain in right shoulder: Secondary | ICD-10-CM

## 2018-03-28 DIAGNOSIS — M25512 Pain in left shoulder: Secondary | ICD-10-CM | POA: Diagnosis not present

## 2018-03-28 DIAGNOSIS — G8929 Other chronic pain: Secondary | ICD-10-CM | POA: Diagnosis not present

## 2018-03-28 NOTE — Patient Instructions (Signed)

## 2018-03-28 NOTE — Progress Notes (Signed)
 .  Numeric Pain Rating Scale and Functional Assessment Average Pain 8   In the last MONTH (on 0-10 scale) has pain interfered with the following?  1. General activity like being  able to carry out your everyday physical activities such as walking, climbing stairs, carrying groceries, or moving a chair?  Rating(7)   -Dye Allergies.  

## 2018-03-28 NOTE — Progress Notes (Signed)
Paul [name] - 67 y.o. male MRN [id number]  Date of birth: [birth year]  Office Visit Note: Visit Date: 03/28/2018 PCP: Leanna Battles, MD Referred by: Leanna Battles, MD  Subjective: Chief Complaint  Patient presents with  . Right Shoulder - Pain  . Left Shoulder - Pain  . Left Arm - Pain  . Right Arm - Pain  . Right Hand - Pain  . Left Hand - Pain   HPI:  Paul [name] is a 67 y.o. male who comes in today For planned repeat bilateral glenohumeral joint injections with fluoroscopic guidance.  Prior injections lasted around 5 months with 80% relief and increased functional activity.  Patient's course complicated by history of cervical fusion as well as anxiety with injections.  He has had no new trauma or injuries.  Still has some complaints into the arms and hands but his main complaint is the shoulders which are worse with movement and mechanical.  ROS Otherwise per HPI.  Assessment & Plan: Visit Diagnoses:  1. Chronic left shoulder pain   2. Chronic right shoulder pain     Plan: No additional findings.   Meds & Orders: No orders of the defined types were placed in this encounter.   Orders Placed This Encounter  Procedures  . Large Joint Inj: bilateral glenohumeral  . XR C-ARM NO REPORT    Follow-up: Return if symptoms worsen or fail to improve.   Procedures: Large Joint Inj: bilateral glenohumeral on 03/28/2018 2:58 PM Indications: pain and diagnostic evaluation Details: 22 G 3.5 in needle, fluoroscopy-guided anteromedial approach  Arthrogram: No  Medications (Right): 4 mL bupivacaine 0.25 %; 60 mg triamcinolone acetonide 40 MG/ML Medications (Left): 4 mL bupivacaine 0.25 %; 60 mg triamcinolone acetonide 40 MG/ML Outcome: tolerated well, no immediate complications  There was excellent flow of contrast producing a partial arthrogram of the glenohumeral joint. The patient did have relief of symptoms during the anesthetic phase of the injection. Procedure, treatment  alternatives, risks and benefits explained, specific risks discussed. Consent was given by the patient. Immediately prior to procedure a time out was called to verify the correct patient, procedure, equipment, support staff and site/side marked as required. Patient was prepped and draped in the usual sterile fashion.      No notes on file   Clinical History: MRI CERVICAL SPINE WITHOUT CONTRAST  TECHNIQUE: Multiplanar, multisequence MR imaging of the cervical spine was performed. No intravenous contrast was administered.  COMPARISON:  Cervical spine radiograph 07/24/2012  Cervical spine MRI 07/24/2012  FINDINGS: Alignment: Normal  Vertebrae: C4-C6 ACDF.  No acute abnormality.  Cord: Normal signal and caliber.  Posterior Fossa, vertebral arteries, paraspinal tissues: Negative.  Disc levels:  C1-C2: Normal.  C2-C3: Normal disc space and facets. No spinal canal or neuroforaminal stenosis.  C3-C4: Bilateral uncovertebral hypertrophy with moderate neural foraminal stenosis. Mild central spinal canal stenosis. This level is unchanged.  C4-C5: Postsurgical changes with wide patency of the thecal sac. No foraminal stenosis.  C5-C6: Postsurgical changes with widely patent spinal canal. Mild narrowing of the left neural foramen.  C6-C7: Small central disc protrusion narrows the ventral thecal sac. Mild spinal canal stenosis. No neural foraminal stenosis.  C7-T1: Normal disc space and facets. No spinal canal or neuroforaminal stenosis.  IMPRESSION: 1. Moderate bilateral neural foraminal stenosis and mild spinal canal stenosis at C3-C4, unchanged. 2. C4-C6 ACDF with widely patent spinal canal.   Electronically Signed   By: Ulyses Jarred M.D.   On: 11/09/2016 21:46  Objective:  VS:  HT:    WT:   BMI:     BP:   HR: bpm  TEMP: ( )  RESP:  Physical Exam  Ortho Exam Imaging: Xr C-arm No Report  Result Date: 03/28/2018 Please see Notes tab  for imaging impression.

## 2018-03-29 MED ORDER — BUPIVACAINE HCL 0.25 % IJ SOLN
4.0000 mL | INTRAMUSCULAR | Status: AC | PRN
  Administered 2018-03-28: 4 mL via INTRA_ARTICULAR

## 2018-03-29 MED ORDER — TRIAMCINOLONE ACETONIDE 40 MG/ML IJ SUSP
60.0000 mg | INTRAMUSCULAR | Status: AC | PRN
  Administered 2018-03-28: 60 mg via INTRA_ARTICULAR

## 2018-04-21 ENCOUNTER — Inpatient Hospital Stay (HOSPITAL_COMMUNITY): Payer: Medicare Other

## 2018-04-21 ENCOUNTER — Emergency Department (HOSPITAL_COMMUNITY): Payer: Medicare Other

## 2018-04-21 ENCOUNTER — Inpatient Hospital Stay (HOSPITAL_COMMUNITY)
Admission: EM | Admit: 2018-04-21 | Discharge: 2018-05-03 | DRG: 870 | Disposition: A | Discharge status: Rehabilitation Facility | Payer: Medicare Other | Attending: Internal Medicine | Admitting: Internal Medicine

## 2018-04-21 ENCOUNTER — Encounter (HOSPITAL_COMMUNITY): Payer: Self-pay | Admitting: Emergency Medicine

## 2018-04-21 DIAGNOSIS — Z720 Tobacco use: Secondary | ICD-10-CM | POA: Diagnosis not present

## 2018-04-21 DIAGNOSIS — J440 Chronic obstructive pulmonary disease with acute lower respiratory infection: Secondary | ICD-10-CM | POA: Diagnosis present

## 2018-04-21 DIAGNOSIS — J9620 Acute and chronic respiratory failure, unspecified whether with hypoxia or hypercapnia: Secondary | ICD-10-CM | POA: Diagnosis not present

## 2018-04-21 DIAGNOSIS — A419 Sepsis, unspecified organism: Secondary | ICD-10-CM | POA: Diagnosis not present

## 2018-04-21 DIAGNOSIS — E870 Hyperosmolality and hypernatremia: Secondary | ICD-10-CM | POA: Diagnosis not present

## 2018-04-21 DIAGNOSIS — I2510 Atherosclerotic heart disease of native coronary artery without angina pectoris: Secondary | ICD-10-CM | POA: Diagnosis not present

## 2018-04-21 DIAGNOSIS — E669 Obesity, unspecified: Secondary | ICD-10-CM | POA: Diagnosis present

## 2018-04-21 DIAGNOSIS — E8809 Other disorders of plasma-protein metabolism, not elsewhere classified: Secondary | ICD-10-CM | POA: Diagnosis present

## 2018-04-21 DIAGNOSIS — Z955 Presence of coronary angioplasty implant and graft: Secondary | ICD-10-CM

## 2018-04-21 DIAGNOSIS — J15211 Pneumonia due to Methicillin susceptible Staphylococcus aureus: Secondary | ICD-10-CM | POA: Diagnosis present

## 2018-04-21 DIAGNOSIS — Z7902 Long term (current) use of antithrombotics/antiplatelets: Secondary | ICD-10-CM | POA: Diagnosis not present

## 2018-04-21 DIAGNOSIS — E1110 Type 2 diabetes mellitus with ketoacidosis without coma: Secondary | ICD-10-CM | POA: Diagnosis not present

## 2018-04-21 DIAGNOSIS — J154 Pneumonia due to other streptococci: Secondary | ICD-10-CM | POA: Diagnosis not present

## 2018-04-21 DIAGNOSIS — J449 Chronic obstructive pulmonary disease, unspecified: Secondary | ICD-10-CM | POA: Diagnosis not present

## 2018-04-21 DIAGNOSIS — G9349 Other encephalopathy: Secondary | ICD-10-CM | POA: Diagnosis not present

## 2018-04-21 DIAGNOSIS — R5381 Other malaise: Secondary | ICD-10-CM | POA: Diagnosis not present

## 2018-04-21 DIAGNOSIS — R739 Hyperglycemia, unspecified: Secondary | ICD-10-CM

## 2018-04-21 DIAGNOSIS — I25119 Atherosclerotic heart disease of native coronary artery with unspecified angina pectoris: Secondary | ICD-10-CM

## 2018-04-21 DIAGNOSIS — Z825 Family history of asthma and other chronic lower respiratory diseases: Secondary | ICD-10-CM

## 2018-04-21 DIAGNOSIS — R7881 Bacteremia: Secondary | ICD-10-CM

## 2018-04-21 DIAGNOSIS — E46 Unspecified protein-calorie malnutrition: Secondary | ICD-10-CM | POA: Diagnosis not present

## 2018-04-21 DIAGNOSIS — K5900 Constipation, unspecified: Secondary | ICD-10-CM | POA: Diagnosis present

## 2018-04-21 DIAGNOSIS — R6520 Severe sepsis without septic shock: Secondary | ICD-10-CM | POA: Diagnosis not present

## 2018-04-21 DIAGNOSIS — G4733 Obstructive sleep apnea (adult) (pediatric): Secondary | ICD-10-CM | POA: Diagnosis present

## 2018-04-21 DIAGNOSIS — R1312 Dysphagia, oropharyngeal phase: Secondary | ICD-10-CM | POA: Diagnosis not present

## 2018-04-21 DIAGNOSIS — Z9981 Dependence on supplemental oxygen: Secondary | ICD-10-CM

## 2018-04-21 DIAGNOSIS — Z885 Allergy status to narcotic agent status: Secondary | ICD-10-CM

## 2018-04-21 DIAGNOSIS — Z6830 Body mass index (BMI) 30.0-30.9, adult: Secondary | ICD-10-CM | POA: Diagnosis not present

## 2018-04-21 DIAGNOSIS — Z01818 Encounter for other preprocedural examination: Secondary | ICD-10-CM

## 2018-04-21 DIAGNOSIS — Z791 Long term (current) use of non-steroidal anti-inflammatories (NSAID): Secondary | ICD-10-CM

## 2018-04-21 DIAGNOSIS — B974 Respiratory syncytial virus as the cause of diseases classified elsewhere: Secondary | ICD-10-CM | POA: Diagnosis present

## 2018-04-21 DIAGNOSIS — J9621 Acute and chronic respiratory failure with hypoxia: Secondary | ICD-10-CM | POA: Diagnosis present

## 2018-04-21 DIAGNOSIS — R0989 Other specified symptoms and signs involving the circulatory and respiratory systems: Secondary | ICD-10-CM | POA: Diagnosis not present

## 2018-04-21 DIAGNOSIS — N179 Acute kidney failure, unspecified: Secondary | ICD-10-CM | POA: Diagnosis not present

## 2018-04-21 DIAGNOSIS — R000 Tachycardia, unspecified: Secondary | ICD-10-CM | POA: Diagnosis not present

## 2018-04-21 DIAGNOSIS — Z7982 Long term (current) use of aspirin: Secondary | ICD-10-CM

## 2018-04-21 DIAGNOSIS — E1142 Type 2 diabetes mellitus with diabetic polyneuropathy: Secondary | ICD-10-CM | POA: Diagnosis not present

## 2018-04-21 DIAGNOSIS — Z4682 Encounter for fitting and adjustment of non-vascular catheter: Secondary | ICD-10-CM | POA: Diagnosis not present

## 2018-04-21 DIAGNOSIS — D62 Acute posthemorrhagic anemia: Secondary | ICD-10-CM

## 2018-04-21 DIAGNOSIS — R7309 Other abnormal glucose: Secondary | ICD-10-CM | POA: Diagnosis not present

## 2018-04-21 DIAGNOSIS — J9601 Acute respiratory failure with hypoxia: Secondary | ICD-10-CM | POA: Diagnosis not present

## 2018-04-21 DIAGNOSIS — J9600 Acute respiratory failure, unspecified whether with hypoxia or hypercapnia: Secondary | ICD-10-CM | POA: Diagnosis not present

## 2018-04-21 DIAGNOSIS — R471 Dysarthria and anarthria: Secondary | ICD-10-CM | POA: Diagnosis present

## 2018-04-21 DIAGNOSIS — G9340 Encephalopathy, unspecified: Secondary | ICD-10-CM | POA: Diagnosis not present

## 2018-04-21 DIAGNOSIS — R918 Other nonspecific abnormal finding of lung field: Secondary | ICD-10-CM | POA: Diagnosis not present

## 2018-04-21 DIAGNOSIS — Z888 Allergy status to other drugs, medicaments and biological substances status: Secondary | ICD-10-CM | POA: Diagnosis not present

## 2018-04-21 DIAGNOSIS — A409 Streptococcal sepsis, unspecified: Secondary | ICD-10-CM | POA: Diagnosis not present

## 2018-04-21 DIAGNOSIS — F17210 Nicotine dependence, cigarettes, uncomplicated: Secondary | ICD-10-CM | POA: Diagnosis present

## 2018-04-21 DIAGNOSIS — R0689 Other abnormalities of breathing: Secondary | ICD-10-CM | POA: Diagnosis not present

## 2018-04-21 DIAGNOSIS — R21 Rash and other nonspecific skin eruption: Secondary | ICD-10-CM | POA: Diagnosis not present

## 2018-04-21 DIAGNOSIS — R0602 Shortness of breath: Secondary | ICD-10-CM

## 2018-04-21 DIAGNOSIS — Z4659 Encounter for fitting and adjustment of other gastrointestinal appliance and device: Secondary | ICD-10-CM

## 2018-04-21 DIAGNOSIS — E785 Hyperlipidemia, unspecified: Secondary | ICD-10-CM | POA: Diagnosis present

## 2018-04-21 DIAGNOSIS — J189 Pneumonia, unspecified organism: Secondary | ICD-10-CM | POA: Diagnosis not present

## 2018-04-21 DIAGNOSIS — I10 Essential (primary) hypertension: Secondary | ICD-10-CM | POA: Diagnosis not present

## 2018-04-21 DIAGNOSIS — J181 Lobar pneumonia, unspecified organism: Secondary | ICD-10-CM | POA: Diagnosis not present

## 2018-04-21 DIAGNOSIS — R062 Wheezing: Secondary | ICD-10-CM | POA: Diagnosis not present

## 2018-04-21 DIAGNOSIS — E1169 Type 2 diabetes mellitus with other specified complication: Secondary | ICD-10-CM | POA: Diagnosis not present

## 2018-04-21 DIAGNOSIS — J9690 Respiratory failure, unspecified, unspecified whether with hypoxia or hypercapnia: Secondary | ICD-10-CM

## 2018-04-21 DIAGNOSIS — Z978 Presence of other specified devices: Secondary | ICD-10-CM

## 2018-04-21 DIAGNOSIS — E1165 Type 2 diabetes mellitus with hyperglycemia: Secondary | ICD-10-CM | POA: Diagnosis not present

## 2018-04-21 DIAGNOSIS — R9431 Abnormal electrocardiogram [ECG] [EKG]: Secondary | ICD-10-CM | POA: Diagnosis not present

## 2018-04-21 DIAGNOSIS — Z8249 Family history of ischemic heart disease and other diseases of the circulatory system: Secondary | ICD-10-CM | POA: Diagnosis not present

## 2018-04-21 DIAGNOSIS — R1310 Dysphagia, unspecified: Secondary | ICD-10-CM | POA: Diagnosis not present

## 2018-04-21 DIAGNOSIS — M109 Gout, unspecified: Secondary | ICD-10-CM | POA: Diagnosis present

## 2018-04-21 DIAGNOSIS — J9811 Atelectasis: Secondary | ICD-10-CM | POA: Diagnosis not present

## 2018-04-21 DIAGNOSIS — G479 Sleep disorder, unspecified: Secondary | ICD-10-CM | POA: Diagnosis not present

## 2018-04-21 DIAGNOSIS — L8990 Pressure ulcer of unspecified site, unspecified stage: Secondary | ICD-10-CM

## 2018-04-21 DIAGNOSIS — B379 Candidiasis, unspecified: Secondary | ICD-10-CM | POA: Diagnosis not present

## 2018-04-21 DIAGNOSIS — R0902 Hypoxemia: Secondary | ICD-10-CM | POA: Diagnosis not present

## 2018-04-21 DIAGNOSIS — J13 Pneumonia due to Streptococcus pneumoniae: Secondary | ICD-10-CM | POA: Diagnosis not present

## 2018-04-21 LAB — CBC
HCT: 41.3 % (ref 39.0–52.0)
Hemoglobin: 13.2 g/dL (ref 13.0–17.0)
MCH: 29.9 pg (ref 26.0–34.0)
MCHC: 32 g/dL (ref 30.0–36.0)
MCV: 93.4 fL (ref 80.0–100.0)
Platelets: 190 10*3/uL (ref 150–400)
RBC: 4.42 MIL/uL (ref 4.22–5.81)
RDW: 12.9 % (ref 11.5–15.5)
WBC: 7 10*3/uL (ref 4.0–10.5)
nRBC: 0 % (ref 0.0–0.2)

## 2018-04-21 LAB — CBC WITH DIFFERENTIAL/PLATELET
Abs Immature Granulocytes: 0.17 10*3/uL — ABNORMAL HIGH (ref 0.00–0.07)
Basophils Absolute: 0.1 10*3/uL (ref 0.0–0.1)
Basophils Relative: 1 %
Eosinophils Absolute: 0 10*3/uL (ref 0.0–0.5)
Eosinophils Relative: 0 %
HCT: 46.9 % (ref 39.0–52.0)
Hemoglobin: 14.8 g/dL (ref 13.0–17.0)
Immature Granulocytes: 2 %
Lymphocytes Relative: 7 %
Lymphs Abs: 0.7 10*3/uL (ref 0.7–4.0)
MCH: 29.5 pg (ref 26.0–34.0)
MCHC: 31.6 g/dL (ref 30.0–36.0)
MCV: 93.6 fL (ref 80.0–100.0)
Monocytes Absolute: 0.5 10*3/uL (ref 0.1–1.0)
Monocytes Relative: 5 %
Neutro Abs: 8.8 10*3/uL — ABNORMAL HIGH (ref 1.7–7.7)
Neutrophils Relative %: 85 %
Platelets: 243 10*3/uL (ref 150–400)
RBC: 5.01 MIL/uL (ref 4.22–5.81)
RDW: 12.9 % (ref 11.5–15.5)
WBC: 10.2 10*3/uL (ref 4.0–10.5)
nRBC: 0 % (ref 0.0–0.2)

## 2018-04-21 LAB — BASIC METABOLIC PANEL
Anion gap: 12 (ref 5–15)
BUN: 44 mg/dL — ABNORMAL HIGH (ref 8–23)
CO2: 19 mmol/L — ABNORMAL LOW (ref 22–32)
Calcium: 8.3 mg/dL — ABNORMAL LOW (ref 8.9–10.3)
Chloride: 108 mmol/L (ref 98–111)
Creatinine, Ser: 1.59 mg/dL — ABNORMAL HIGH (ref 0.61–1.24)
GFR calc Af Amer: 51 mL/min — ABNORMAL LOW (ref >60)
GFR calc non Af Amer: 44 mL/min — ABNORMAL LOW (ref >60)
Glucose, Bld: 258 mg/dL — ABNORMAL HIGH (ref 70–99)
Potassium: 4.3 mmol/L (ref 3.5–5.1)
Sodium: 139 mmol/L (ref 135–145)

## 2018-04-21 LAB — URINALYSIS, ROUTINE W REFLEX MICROSCOPIC
Bilirubin Urine: NEGATIVE
Glucose, UA: >=500 mg/dL — AB
Ketones, ur: 20 mg/dL — AB
Leukocytes, UA: NEGATIVE
Nitrite: NEGATIVE
Protein, ur: NEGATIVE mg/dL
Specific Gravity, Urine: 1.026 (ref 1.005–1.030)
pH: 5 (ref 5.0–8.0)

## 2018-04-21 LAB — COMPREHENSIVE METABOLIC PANEL
ALT: 29 U/L (ref 0–44)
AST: 28 U/L (ref 15–41)
Albumin: 2.7 g/dL — ABNORMAL LOW (ref 3.5–5.0)
Alkaline Phosphatase: 70 U/L (ref 38–126)
Anion gap: 22 — ABNORMAL HIGH (ref 5–15)
BUN: 40 mg/dL — ABNORMAL HIGH (ref 8–23)
CO2: 14 mmol/L — ABNORMAL LOW (ref 22–32)
Calcium: 9.1 mg/dL (ref 8.9–10.3)
Chloride: 96 mmol/L — ABNORMAL LOW (ref 98–111)
Creatinine, Ser: 1.76 mg/dL — ABNORMAL HIGH (ref 0.61–1.24)
GFR calc Af Amer: 45 mL/min — ABNORMAL LOW (ref >60)
GFR calc non Af Amer: 39 mL/min — ABNORMAL LOW (ref >60)
Glucose, Bld: 626 mg/dL (ref 70–99)
Potassium: 4.5 mmol/L (ref 3.5–5.1)
Sodium: 132 mmol/L — ABNORMAL LOW (ref 135–145)
Total Bilirubin: 1.7 mg/dL — ABNORMAL HIGH (ref 0.3–1.2)
Total Protein: 7.2 g/dL (ref 6.5–8.1)

## 2018-04-21 LAB — GLUCOSE, CAPILLARY
Glucose-Capillary: 217 mg/dL — ABNORMAL HIGH (ref 70–99)
Glucose-Capillary: 325 mg/dL — ABNORMAL HIGH (ref 70–99)
Glucose-Capillary: 397 mg/dL — ABNORMAL HIGH (ref 70–99)
Glucose-Capillary: 416 mg/dL — ABNORMAL HIGH (ref 70–99)
Glucose-Capillary: 433 mg/dL — ABNORMAL HIGH (ref 70–99)
Glucose-Capillary: 457 mg/dL — ABNORMAL HIGH (ref 70–99)

## 2018-04-21 LAB — I-STAT VENOUS BLOOD GAS, ED
Acid-base deficit: 7 mmol/L — ABNORMAL HIGH (ref 0.0–2.0)
Bicarbonate: 16.3 mmol/L — ABNORMAL LOW (ref 20.0–28.0)
O2 Saturation: 91 %
TCO2: 17 mmol/L — ABNORMAL LOW (ref 22–32)
pCO2, Ven: 28 mmHg — ABNORMAL LOW (ref 44.0–60.0)
pH, Ven: 7.373 (ref 7.250–7.430)
pO2, Ven: 62 mmHg — ABNORMAL HIGH (ref 32.0–45.0)

## 2018-04-21 LAB — I-STAT ARTERIAL BLOOD GAS, ED
Acid-base deficit: 6 mmol/L — ABNORMAL HIGH (ref 0.0–2.0)
Bicarbonate: 21.1 mmol/L (ref 20.0–28.0)
O2 Saturation: 89 %
Patient temperature: 98.6
TCO2: 23 mmol/L (ref 22–32)
pCO2 arterial: 47.3 mmHg (ref 32.0–48.0)
pH, Arterial: 7.258 — ABNORMAL LOW (ref 7.350–7.450)
pO2, Arterial: 66 mmHg — ABNORMAL LOW (ref 83.0–108.0)

## 2018-04-21 LAB — LACTIC ACID, PLASMA
Lactic Acid, Venous: 2.9 mmol/L (ref 0.5–1.9)
Lactic Acid, Venous: 2.9 mmol/L (ref 0.5–1.9)

## 2018-04-21 LAB — PHOSPHORUS: Phosphorus: 3.1 mg/dL (ref 2.5–4.6)

## 2018-04-21 LAB — I-STAT TROPONIN, ED: Troponin i, poc: 0.04 ng/mL (ref 0.00–0.08)

## 2018-04-21 LAB — BETA-HYDROXYBUTYRIC ACID: Beta-Hydroxybutyric Acid: 1.63 mmol/L — ABNORMAL HIGH (ref 0.05–0.27)

## 2018-04-21 LAB — TRIGLYCERIDES: Triglycerides: 202 mg/dL — ABNORMAL HIGH (ref <150)

## 2018-04-21 LAB — MAGNESIUM: Magnesium: 2.4 mg/dL (ref 1.7–2.4)

## 2018-04-21 LAB — BRAIN NATRIURETIC PEPTIDE: B Natriuretic Peptide: 146.1 pg/mL — ABNORMAL HIGH (ref 0.0–100.0)

## 2018-04-21 LAB — CREATININE, SERUM
Creatinine, Ser: 1.57 mg/dL — ABNORMAL HIGH (ref 0.61–1.24)
GFR calc Af Amer: 51 mL/min — ABNORMAL LOW (ref >60)
GFR calc non Af Amer: 44 mL/min — ABNORMAL LOW (ref >60)

## 2018-04-21 LAB — CBG MONITORING, ED: Glucose-Capillary: 507 mg/dL (ref 70–99)

## 2018-04-21 LAB — I-STAT CG4 LACTIC ACID, ED: Lactic Acid, Venous: 5.28 mmol/L (ref 0.5–1.9)

## 2018-04-21 MED ORDER — SODIUM CHLORIDE 0.9 % IV BOLUS (SEPSIS)
500.0000 mL | Freq: Once | INTRAVENOUS | Status: AC
  Administered 2018-04-21: 500 mL via INTRAVENOUS

## 2018-04-21 MED ORDER — VANCOMYCIN HCL 10 G IV SOLR
1500.0000 mg | Freq: Once | INTRAVENOUS | Status: DC
  Filled 2018-04-21: qty 1500

## 2018-04-21 MED ORDER — PROPOFOL 1000 MG/100ML IV EMUL
0.0000 ug/kg/min | INTRAVENOUS | Status: DC
  Administered 2018-04-21: 30 ug/kg/min via INTRAVENOUS
  Administered 2018-04-21 – 2018-04-22 (×3): 40 ug/kg/min via INTRAVENOUS
  Administered 2018-04-22: 20 ug/kg/min via INTRAVENOUS
  Administered 2018-04-22: 30 ug/kg/min via INTRAVENOUS
  Administered 2018-04-23: 40 ug/kg/min via INTRAVENOUS
  Administered 2018-04-23: 30 ug/kg/min via INTRAVENOUS
  Administered 2018-04-23: 40 ug/kg/min via INTRAVENOUS
  Administered 2018-04-23: 30 ug/kg/min via INTRAVENOUS
  Administered 2018-04-24 (×2): 40 ug/kg/min via INTRAVENOUS
  Filled 2018-04-21 (×10): qty 100

## 2018-04-21 MED ORDER — ORAL CARE MOUTH RINSE
15.0000 mL | OROMUCOSAL | Status: DC
  Administered 2018-04-21 – 2018-04-28 (×62): 15 mL via OROMUCOSAL

## 2018-04-21 MED ORDER — VANCOMYCIN HCL IN DEXTROSE 1-5 GM/200ML-% IV SOLN: 1000.0000 mg | Freq: Once | INTRAVENOUS | Status: DC

## 2018-04-21 MED ORDER — ENOXAPARIN SODIUM 30 MG/0.3ML ~~LOC~~ SOLN
30.0000 mg | SUBCUTANEOUS | Status: DC
  Administered 2018-04-21: 30 mg via SUBCUTANEOUS
  Filled 2018-04-21: qty 0.3

## 2018-04-21 MED ORDER — FENTANYL CITRATE (PF) 100 MCG/2ML IJ SOLN
100.0000 ug | INTRAMUSCULAR | Status: AC | PRN
  Administered 2018-04-21 – 2018-04-23 (×3): 100 ug via INTRAVENOUS
  Filled 2018-04-21 (×3): qty 2

## 2018-04-21 MED ORDER — PROPOFOL 1000 MG/100ML IV EMUL
INTRAVENOUS | Status: AC
  Filled 2018-04-21: qty 100

## 2018-04-21 MED ORDER — FENTANYL CITRATE (PF) 100 MCG/2ML IJ SOLN
100.0000 ug | INTRAMUSCULAR | Status: DC | PRN
  Administered 2018-04-22 – 2018-04-25 (×11): 100 ug via INTRAVENOUS
  Filled 2018-04-21 (×9): qty 2

## 2018-04-21 MED ORDER — SODIUM CHLORIDE 0.9 % IV BOLUS (SEPSIS)
1000.0000 mL | Freq: Once | INTRAVENOUS | Status: AC
  Administered 2018-04-21: 1000 mL via INTRAVENOUS

## 2018-04-21 MED ORDER — SODIUM CHLORIDE 0.9 % IV SOLN
INTRAVENOUS | Status: DC
  Administered 2018-04-21: 22:00:00 via INTRAVENOUS

## 2018-04-21 MED ORDER — VANCOMYCIN HCL IN DEXTROSE 1-5 GM/200ML-% IV SOLN: 1000.0000 mg | INTRAVENOUS | Status: DC

## 2018-04-21 MED ORDER — SODIUM CHLORIDE 0.9 % IV SOLN
500.0000 mg | INTRAVENOUS | Status: DC
  Filled 2018-04-21: qty 500

## 2018-04-21 MED ORDER — CHLORHEXIDINE GLUCONATE 0.12% ORAL RINSE (MEDLINE KIT)
15.0000 mL | Freq: Two times a day (BID) | OROMUCOSAL | Status: DC
  Administered 2018-04-21 – 2018-05-03 (×23): 15 mL via OROMUCOSAL

## 2018-04-21 MED ORDER — SODIUM CHLORIDE 0.9 % IV SOLN
2.0000 g | INTRAVENOUS | Status: DC
  Administered 2018-04-22 – 2018-04-23 (×2): 2 g via INTRAVENOUS
  Filled 2018-04-21 (×3): qty 20

## 2018-04-21 MED ORDER — ROCURONIUM BROMIDE 50 MG/5ML IV SOLN
INTRAVENOUS | Status: DC | PRN
  Administered 2018-04-21: 80 mg via INTRAVENOUS

## 2018-04-21 MED ORDER — SODIUM CHLORIDE 0.9 % IV SOLN
500.0000 mg | INTRAVENOUS | Status: DC
  Administered 2018-04-21: 500 mg via INTRAVENOUS
  Filled 2018-04-21: qty 500

## 2018-04-21 MED ORDER — DEXTROSE-NACL 5-0.45 % IV SOLN
INTRAVENOUS | Status: DC
  Administered 2018-04-22 (×2): via INTRAVENOUS

## 2018-04-21 MED ORDER — DOCUSATE SODIUM 50 MG/5ML PO LIQD
100.0000 mg | Freq: Two times a day (BID) | ORAL | Status: DC | PRN
  Filled 2018-04-21: qty 10

## 2018-04-21 MED ORDER — POTASSIUM CHLORIDE 10 MEQ/100ML IV SOLN: 10.0000 meq | INTRAVENOUS | Status: AC

## 2018-04-21 MED ORDER — MIDAZOLAM HCL 2 MG/2ML IJ SOLN
2.0000 mg | INTRAMUSCULAR | Status: DC | PRN
  Filled 2018-04-21: qty 2

## 2018-04-21 MED ORDER — SODIUM CHLORIDE 0.9 % IV SOLN
2.0000 g | INTRAVENOUS | Status: DC
  Administered 2018-04-21: 2 g via INTRAVENOUS
  Filled 2018-04-21: qty 20

## 2018-04-21 MED ORDER — ETOMIDATE 2 MG/ML IV SOLN
INTRAVENOUS | Status: DC | PRN
  Administered 2018-04-21: 20 mg via INTRAVENOUS

## 2018-04-21 MED ORDER — INSULIN REGULAR(HUMAN) IN NACL 100-0.9 UT/100ML-% IV SOLN
INTRAVENOUS | Status: DC
  Administered 2018-04-21: 4 [IU]/h via INTRAVENOUS
  Filled 2018-04-21: qty 100

## 2018-04-21 MED ORDER — CLOPIDOGREL BISULFATE 75 MG PO TABS
75.0000 mg | ORAL_TABLET | Freq: Every day | ORAL | Status: DC
  Administered 2018-04-22 – 2018-05-03 (×11): 75 mg via ORAL
  Filled 2018-04-21 (×11): qty 1

## 2018-04-21 MED ORDER — ACETAMINOPHEN 325 MG PO TABS
650.0000 mg | ORAL_TABLET | ORAL | Status: DC | PRN
  Administered 2018-04-21 – 2018-04-23 (×3): 650 mg via ORAL
  Filled 2018-04-21 (×4): qty 2

## 2018-04-21 MED ORDER — INSULIN REGULAR(HUMAN) IN NACL 100-0.9 UT/100ML-% IV SOLN
INTRAVENOUS | Status: DC
  Administered 2018-04-21: 2.7 [IU]/h via INTRAVENOUS

## 2018-04-21 MED ORDER — INSULIN ASPART 100 UNIT/ML ~~LOC~~ SOLN
10.0000 [IU] | Freq: Once | SUBCUTANEOUS | Status: AC
  Administered 2018-04-21: 10 [IU] via INTRAVENOUS

## 2018-04-21 MED ORDER — ONDANSETRON HCL 4 MG/2ML IJ SOLN: 4.0000 mg | Freq: Four times a day (QID) | INTRAMUSCULAR | Status: DC | PRN

## 2018-04-21 MED ORDER — FAMOTIDINE IN NACL 20-0.9 MG/50ML-% IV SOLN
20.0000 mg | INTRAVENOUS | Status: DC
  Administered 2018-04-21 – 2018-04-29 (×9): 20 mg via INTRAVENOUS
  Filled 2018-04-21 (×9): qty 50

## 2018-04-21 MED ORDER — SODIUM CHLORIDE 0.9 % IV SOLN: INTRAVENOUS | Status: AC

## 2018-04-21 MED ORDER — MIDAZOLAM HCL 2 MG/2ML IJ SOLN
2.0000 mg | INTRAMUSCULAR | Status: DC | PRN
  Administered 2018-04-21 – 2018-04-27 (×10): 2 mg via INTRAVENOUS
  Filled 2018-04-21 (×10): qty 2

## 2018-04-21 MED ORDER — ENOXAPARIN SODIUM 40 MG/0.4ML ~~LOC~~ SOLN
40.0000 mg | SUBCUTANEOUS | Status: DC
  Administered 2018-04-22 – 2018-05-02 (×11): 40 mg via SUBCUTANEOUS
  Filled 2018-04-21 (×11): qty 0.4

## 2018-04-21 NOTE — ED Notes (Signed)
Report called  

## 2018-04-21 NOTE — ED Provider Notes (Signed)
[name] EMERGENCY DE[facility]MENT Provider Note   CSN: [id number] Arrival date & time: 04/21/18  1150     History   Chief Complaint Chief Complaint  Patient presents with  . Respiratory Distress    HPI Paul [name] is a 68 y.o. male.  Patient is a 68 year old male with a history of COPD, coronary artery disease status post stenting, hypertension presenting today by EMS for difficulty breathing.  Family states that he lives alone but when they went to see him yesterday they did not see him.  He would not come to the door but when they checked later his dog was outside and they knew he had been moving around.  However today when they did finally see him he was confused, short of breath and not himself.  Unclear how long patient symptoms have been going on but he did tell EMS that he had been running a fever.  When EMS arrived patient's sats on room air were in the 80s.  He was tachypneic and felt warm.  They did give 1 albuterol neb in route and started BiPAP.   The history is provided by the EMS personnel, the patient and a relative. The history is limited by the condition of the patient.    Past Medical History:  Diagnosis Date  . CAD S/P percutaneous coronary angioplasty -- D1, Xience Xpedition DES 2.5 mm x 33 mm 11/24/2011   a) Mild-to-moderate 30-40% lesions in the RCA, LAD and Circumflex. b) CULPRIT LESION: long tubular 70-80% lesion in D1 with FFR of 0.7 --> PCI w/ Xience Xpedition DES 2.5 mm x 30 mm (2.65 MM); c) Lexiscan Myoview 11/2013: No Ischemia or Infarct (Inferior Gut Attenuation) EF 63%.  Marland Kitchen COPD (chronic obstructive pulmonary disease) ([place])    "don't have full case of it; I'm right there at it"  . Essential hypertension 11/24/2011  . Exertional dyspnea, chronic   . Gout   . History of Unstable angina 11/24/2011   Referred for cardiac catheterization  . Hyperlipidemia with target LDL less than 70 11/24/2011  . Obesity (BMI 30.0-34.9)   . OSA (obstructive sleep  apnea), uses oxygen at home did not tolerate cpap 11/24/2011    Patient Active Problem List   Diagnosis Date Noted  . Corneal irritation of both eyes 10/31/2017  . Allergic rhinitis due to animal hair and dander 10/31/2017  . OSA on CPAP 10/10/2016  . OSA and COPD overlap syndrome ([place]) 03/02/2016  . Bronchitis, chronic obstructive, with exacerbation ([place]) 03/02/2016  . Hypoxemia 03/02/2016  . Dependence on supplemental oxygen 03/02/2016  . CAD, multiple vessel 03/02/2016  . Gastroesophageal reflux disease 03/02/2016  . HTN (hypertension), malignant 03/02/2016  . Cigarette smoker 02/22/2014  . DOE (dyspnea on exertion) 01/20/2014  . Obesity (BMI 30.0-34.9)   . Essential hypertension 11/24/2011  . Hyperlipidemia with target LDL less than 70 11/24/2011  . OSA (obstructive sleep apnea), uses oxygen at home did not tolerate cpap 11/24/2011  . COPD GOLD I  11/24/2011  . CAD S/P percutaneous coronary angioplasty -- D1, Xience Xpedition DES 2.5 mm x 33 mm 11/24/2011    Past Surgical History:  Procedure Laterality Date  . CERVICAL SPINE SURGERY  2012  . CORONARY ANGIOPLASTY WITH STENT PLACEMENT  11/23/2011   "1; first one"  . LEFT HEART CATHETERIZATION WITH CORONARY ANGIOGRAM N/A 11/23/2011   Procedure: LEFT HEART CATHETERIZATION WITH CORONARY ANGIOGRAM;  Surgeon: Leonie Man, MD;  Location: [facility] CATH LAB;  Service: Cardiovascular;  Laterality: N/A;  Home Medications    Prior to Admission medications   Medication Sig Start Date End Date Taking? Authorizing Provider  albuterol (PROAIR HFA) 108 (90 BASE) MCG/ACT inhaler Inhale 2 puffs into the lungs every 6 (six) hours as needed for wheezing or shortness of breath.    [provider]  albuterol (PROVENTIL) (2.5 MG/3ML) 0.083% nebulizer solution Take 2.5 mg by nebulization every 6 (six) hours as needed for wheezing or shortness of breath.    [provider]  allopurinol (ZYLOPRIM) 300 MG tablet Take 300 mg by mouth  daily.    [provider]  amLODipine (NORVASC) 5 MG tablet Take 5 mg by mouth daily.    [provider]  aspirin 325 MG tablet Take 325 mg by mouth daily.    [provider]  atorvastatin (LIPITOR) 20 MG tablet Take 1 tablet (20 mg total) by mouth daily at 6 PM. 08/17/14 11/12/16  Leonie Man, MD  budesonide-formoterol [facility]) 160-4.5 MCG/ACT inhaler Take 2 puffs first thing in am and then another 2 puffs about 12 hours later. 04/13/14   Tanda Rockers, MD  clopidogrel (PLAVIX) 75 MG tablet Take 1 tablet (75 mg total) by mouth daily. 08/17/14   Leonie Man, MD  colchicine (COLCRYS) 0.6 MG tablet Take two tabs by mouth at once then a third tablet one hour later. 04/22/14   Presson, Audelia Hives, PA  cyclobenzaprine (FLEXERIL) 10 MG tablet Take 10 mg by mouth daily.    [provider]  diazepam (VALIUM) 5 MG tablet Take 1 by mouth 1 hour  pre-procedure with very light food. May bring 2nd tablet to appointment. 03/25/18   Magnus Sinning, MD  meclizine (ANTIVERT) 25 MG tablet Take 25 mg by mouth daily. 02/08/16   [provider]  meloxicam (MOBIC) 15 MG tablet Take 15 mg by mouth daily.    [provider]  metoprolol tartrate (LOPRESSOR) 25 MG tablet Take 25 mg by mouth daily.    [provider]  nitroGLYCERIN (NITROSTAT) 0.4 MG SL tablet Place 1 tablet (0.4 mg total) under the tongue every 5 (five) minutes as needed for chest pain. 11/24/11 04/13/14  Isaiah Serge, NP  nortriptyline (PAMELOR) 25 MG capsule Take 25 mg by mouth at bedtime.    [provider]  omeprazole (PRILOSEC) 20 MG capsule Take 20 mg by mouth daily.    [provider]  oxyCODONE-acetaminophen (PERCOCET/ROXICET) 5-325 MG per tablet Take 1 tablet by mouth every 6 (six) hours as needed. 02/09/14   [provider]  traMADol (ULTRAM) 50 MG tablet Take 1 tablet (50 mg total) by mouth every 6 (six) hours as needed for moderate pain or  severe pain. 04/22/14   Presson, Audelia Hives, PA  zolpidem (AMBIEN) 10 MG tablet Take 10 mg by mouth at bedtime.    [provider]    Family History Family History  Problem Relation Age of Onset  . Heart disease Sister   . Heart attack Brother   . Emphysema Father        smoked  . Aneurysm Father     Social History Social History   Tobacco Use  . Smoking status: Current Some Day Smoker    Packs/day: 1.00    Years: 40.00    Pack years: 40.00    Types: Cigarettes  . Smokeless tobacco: Never Used  . Tobacco comment: 02/18/14- smokes occ cig maybe 3 x per wk  Substance Use Topics  . Alcohol  use: Yes    Alcohol/week: 0.0 standard drinks    Comment: social 2-3 drink a week  . Drug use: No     Allergies   Codeine and Duloxetine hcl   Review of Systems Review of Systems  Unable to perform ROS: Acuity of condition     Physical Exam Updated Vital Signs BP (!) 169/103   Pulse (!) 121   Temp (!) 100.7 F (38.2 C) (Rectal)   Resp (!) 36   SpO2 92%   Physical Exam Vitals signs and nursing note reviewed.  Constitutional:      General: He is not in acute distress.    Appearance: He is well-developed.  HENT:     Head: Normocephalic and atraumatic.     Mouth/Throat:     Mouth: Mucous membranes are dry.  Eyes:     Conjunctiva/sclera: Conjunctivae normal.     Pupils: Pupils are equal, round, and reactive to light.  Neck:     Musculoskeletal: Normal range of motion and neck supple.  Cardiovascular:     Rate and Rhythm: Regular rhythm. Tachycardia present.     Heart sounds: No murmur.  Pulmonary:     Effort: Tachypnea and accessory muscle usage present. No respiratory distress.     Breath sounds: Examination of the right-upper field reveals decreased breath sounds. Decreased breath sounds and rhonchi present. No wheezing or rales.  Abdominal:     General: There is no distension.     Palpations: Abdomen is soft.     Tenderness: There is no abdominal  tenderness. There is no guarding or rebound.  Musculoskeletal: Normal range of motion.        General: No tenderness.     Right lower leg: No edema.     Left lower leg: No edema.  Skin:    General: Skin is warm and dry.     Capillary Refill: Capillary refill takes more than 3 seconds.     Findings: No erythema or rash.     Comments: mottled  Neurological:     Mental Status: He is alert.  Psychiatric:     Comments: Calm and cooperative      ED Treatments / Results  Labs (all labs ordered are listed, but only abnormal results are displayed) Labs Reviewed  CBC WITH DIFFERENTIAL/PLATELET - Abnormal; Notable for the following components:      Result Value   Neutro Abs 8.8 (*)    Abs Immature Granulocytes 0.17 (*)    All other components within normal limits  I-STAT CG4 LACTIC ACID, ED - Abnormal; Notable for the following components:   Lactic Acid, Venous 5.28 (*)    All other components within normal limits  I-STAT VENOUS BLOOD GAS, ED - Abnormal; Notable for the following components:   pCO2, Ven 28.0 (*)    pO2, Ven 62.0 (*)    Bicarbonate 16.3 (*)    TCO2 17 (*)    Acid-base deficit 7.0 (*)    All other components within normal limits  CULTURE, BLOOD (ROUTINE X 2)  CULTURE, BLOOD (ROUTINE X 2)  COMPREHENSIVE METABOLIC PANEL  URINALYSIS, ROUTINE W REFLEX MICROSCOPIC  INFLUENZA PANEL BY PCR (TYPE A & B)  BRAIN NATRIURETIC PEPTIDE  I-STAT TROPONIN, ED  I-STAT CG4 LACTIC ACID, ED    EKG EKG Interpretation  Date/Time:  Sunday April 21 2018 11:54:25 EST Ventricular Rate:  140 PR Interval:    QRS Duration: 128 QT Interval:  325 QTC Calculation: 495 R Axis:   74  Text Interpretation:  Sinus tachycardia Nonspecific intraventricular conduction delay new Repol abnrm suggests ischemia, diffuse leads Baseline wander in lead(s) V4 Confirmed by Blanchie Dessert [phone]) on 04/21/2018 12:15:14 PM   Radiology Dg Chest Port 1 View  Result Date: 04/21/2018 CLINICAL DATA:   Respiratory distress, onset today. Hypoxia. EXAM: PORTABLE CHEST 1 VIEW COMPARISON:  02/05/2014 FINDINGS: There is consolidation of the right upper lobe more dense peripherally. Fullness of the right hilum. Atherosclerotic calcification of the aortic arch. The left lung appears clear. Heart size within normal limits. IMPRESSION: 1. Dense consolidation in the right upper lobe potentially from pneumonia, pulmonary hemorrhage, or atelectasis. Fullness of the right hilum could be from mass or adenopathy. Correlate with patient's symptoms. If treatment for pneumonia is indicated, I would recommend followup PA and lateral chest X-ray in 3-4 weeks following trial of antibiotic therapy to ensure resolution and exclude underlying malignancy. Of further imaging workup is indicated at this time, CT or CT angiography of the chest could be utilized. Electronically Signed   By: Van Clines M.D.   On: 04/21/2018 12:27    Procedures Procedures (including critical care time)  Medications Ordered in ED Medications  sodium chloride 0.9 % bolus 1,000 mL (1,000 mLs Intravenous New Bag/Given 04/21/18 1226)    And  sodium chloride 0.9 % bolus 1,000 mL (has no administration in time range)    And  sodium chloride 0.9 % bolus 500 mL (500 mLs Intravenous New Bag/Given 04/21/18 1306)  cefTRIAXone (ROCEPHIN) 2 g in sodium chloride 0.9 % 100 mL IVPB (2 g Intravenous New Bag/Given 04/21/18 1301)  azithromycin (ZITHROMAX) 500 mg in sodium chloride 0.9 % 250 mL IVPB (has no administration in time range)  vancomycin (VANCOCIN) 1,500 mg in sodium chloride 0.9 % 500 mL IVPB (has no administration in time range)     Initial Impression / Assessment and Plan / ED Course  I have reviewed the triage vital signs and the nursing notes.  Pertinent labs & imaging results that were available during my care of the patient were reviewed by me and considered in my medical decision making (see chart for details).     Patient is a  68 year old male being brought in by EMS today on BiPAP.  Patient was found hypoxic, confused and unable to give much history.  Patient here is on BiPAP with heart rate in the 140s, oxygen saturation at 93% but he is awake.  Difficult to discern whether he is confused or not given the BiPAP mask.  He will follow commands intermittently.  Family did arrive but is not much help when it comes to history because he lives alone.  Patient is not a known alcoholic or heavy drinker.  He does not use drugs but in the past to smoke cigarettes.  Patient does not appear fluid overloaded at this time and has no distal edema.  He has rhonchi bilaterally and decreased breath sounds in the right upper lobe.  There was report of possible fever.  Concern for sepsis.  Code sepsis was initiated.  CBC within normal limits, lactic acid at 5-1/2, VBG with compensated metabolic acidosis without hypercarbia, troponin within normal limits, EKG showing sinus tachycardia with nonspecific ST changes.  Blood cultures are pending.  Chest x-ray is consistent with a right upper lobe pneumonia.  Patient per pharmacy was started on Rocephin, azithromycin and vancomycin.  He has not been admitted to the hospital in the last few months and has no healthcare associated risk factors.  Discussing  with family patient is a full code is they have never had a full conversation about this.  Currently patient is maintaining on BiPAP but concerned that he may require intubation in the future.  2:08 PM CMP consistent with a blood sugar of 626 with an anion gap of 22 and acute kidney injury of 1.76.  On CMP patient CO2 is 14 also on VBG bicarb is 16.  Feel the acidosis could be due to the lactic acidosis as well as possible DKA.  Will treat with fluids and insulin.  CRITICAL CARE Performed by: Shan Valdes Total critical care time: 45 minutes Critical care time was exclusive of separately billable procedures and treating other patients. Critical care  was necessary to treat or prevent imminent or life-threatening deterioration. Critical care was time spent personally by me on the following activities: development of treatment plan with patient and/or surrogate as well as nursing, discussions with consultants, evaluation of patient's response to treatment, examination of patient, obtaining history from patient or surrogate, ordering and performing treatments and interventions, ordering and review of laboratory studies, ordering and review of radiographic studies, pulse oximetry and re-evaluation of patient's condition.   Final Clinical Impressions(s) / ED Diagnoses   Final diagnoses:  Community acquired pneumonia of right upper lobe of lung ([place])  Sepsis with acute hypoxic respiratory failure without septic shock, due to unspecified organism [facility])  Hyperglycemia    ED Discharge Orders    None       Blanchie Dessert, MD 04/21/18 1410

## 2018-04-21 NOTE — ED Notes (Signed)
Roc 80mg  given

## 2018-04-21 NOTE — ED Notes (Signed)
CBG: 507 °

## 2018-04-21 NOTE — Progress Notes (Signed)
CRITICAL VALUE ALERT  Critical Value:  Lactic acid 2.9  Date & Time Notied:  04/21/18 9:01 PM  Provider Notified: elink

## 2018-04-21 NOTE — H&P (Signed)
Attending:    Subjective: Came to the ER today when family found him dyspneic, confused.  Has history of smoking, lives alone, takes medications on his own per daughter.  He cannot provide history as he is confused, on BIPAP.  Found to have RUL pneumonia and profound hypoxemia.  Objective: Vitals:   04/21/18 1445 04/21/18 1500 04/21/18 1600 04/21/18 1708  BP: (!) 143/93  (!) 184/108 132/85  Pulse: (!) 111  (!) 123 (!) 123  Resp:   (!) 24 (!) 36  Temp:      TempSrc:      SpO2: 95% 91%  91%  Weight:   80.7 kg   Height:  5' 6"  (1.676 m)     Vent Mode: PRVC FiO2 (%):  [40 %-100 %] 100 % Set Rate:  [24 bmp] 24 bmp Vt Set:  [510 mL] 510 mL PEEP:  [5 cmH20-10 cmH20] 10 cmH20 Plateau Pressure:  [15 cmH20] 15 cmH20  Intake/Output Summary (Last 24 hours) at 04/21/2018 1732 Last data filed at 04/21/2018 1514 Gross per 24 hour  Intake 1950 ml  Output -  Net 1950 ml    General:  Respiratory distress on BIPAP HENT: NCAT BIPAP mask in place PULM: Crackles RUL, coarse rhonchi, normal effort CV: RRR, no mgr GI: BS+, soft, nontender MSK: normal bulk and tone Neuro: awake, confused, follows commands   CBC    Component Value Date/Time   WBC 10.2 04/21/2018 1257   RBC 5.01 04/21/2018 1257   HGB 14.8 04/21/2018 1257   HCT 46.9 04/21/2018 1257   PLT 243 04/21/2018 1257   MCV 93.6 04/21/2018 1257   MCH 29.5 04/21/2018 1257   MCHC 31.6 04/21/2018 1257   RDW 12.9 04/21/2018 1257   LYMPHSABS 0.7 04/21/2018 1257   MONOABS 0.5 04/21/2018 1257   EOSABS 0.0 04/21/2018 1257   BASOSABS 0.1 04/21/2018 1257    BMET    Component Value Date/Time   NA 132 (L) 04/21/2018 1257   K 4.5 04/21/2018 1257   CL 96 (L) 04/21/2018 1257   CO2 14 (L) 04/21/2018 1257   GLUCOSE 626 (HH) 04/21/2018 1257   BUN 40 (H) 04/21/2018 1257   CREATININE 1.76 (H) 04/21/2018 1257   CALCIUM 9.1 04/21/2018 1257   GFRNONAA 39 (L) 04/21/2018 1257   GFRAA 45 (L) 04/21/2018 1257    CXR images personally reviewed  showing a dense right upper lobe infiltrate  Impression/Plan: Severe community-acquired pneumonia: Continue ceftriaxone and azithromycin, send respiratory cultures from tracheal secretions  Acute kidney injury: Agree with volume resuscitation in emergency room but now appears to have pulmonary edema so hold further resuscitation, monitor urine output, repeat be met in morning  Hyperglycemia: Worrisome for DKA, send serum ketones, start insulin drip, goal glucose is 250 or lower in the next 24 hours  Severe sepsis: Was not hypotensive on admission, received aggressive volume resuscitation, I think lactic acid elevation was due to severe hypoxemia and respiratory failure.  Will hold off on further aggressive volume resuscitation at this point as he has been hypertensive for the duration of his hospitalization, monitor repeat lactic acid  Code status full  Prognosis guarded  My cc time [place], MD [place] [facility] Pager: [phone] Cell: [phone] After 3pm or if no response, call [phone]

## 2018-04-21 NOTE — ED Notes (Signed)
Dr Pennie Banter here to see pt

## 2018-04-21 NOTE — Consult Note (Signed)
NAME:  Paul [name], MRN:  [id number], DOB:  1950-07-16, LOS: 0 ADMISSION DATE:  04/21/2018, CONSULTATION DATE:  04/21/2018 REFERRING MD: Blanchie Dessert, MD  CHIEF COMPLAINT:  Respiratory Distress   Brief History   68yo male with COPD on supplemental O2, CAD s/p stents 2013 presenting after being found confused and SOB by family.  History of present illness   Paul [name] is a 68yo male with COPD on supplemental O2, CAD s/p stents placed 2013, and HTN presenting after being found confused and SOB, requiring intubation. History is provided by his brother who states patient did not answer the phone yesterday. His brother went by the house with the patient's daughter, and the patient did not answer the door although his dog was outside. His brother returned today and found him on the sofa confused, having difficulty breathing, and unable to stand. He states his brother has had a cough recently but did not seem very sick besides complaining about leg pain and soreness, which is usual for him. He was unsure of further history. He states Paul [name] recently quit smoking, drinks a mixed drink occasionally, and does not use drugs recreationally. He is unsure if his brother is compliant with his medications.   Past Medical History   Past Medical History:  Diagnosis Date  . CAD S/P percutaneous coronary angioplasty -- D1, Xience Xpedition DES 2.5 mm x 33 mm 11/24/2011   a) Mild-to-moderate 30-40% lesions in the RCA, LAD and Circumflex. b) CULPRIT LESION: long tubular 70-80% lesion in D1 with FFR of 0.7 --> PCI w/ Xience Xpedition DES 2.5 mm x 30 mm (2.65 MM); c) Lexiscan Myoview 11/2013: No Ischemia or Infarct (Inferior Gut Attenuation) EF 63%.  Marland Kitchen COPD (chronic obstructive pulmonary disease) ([place])    "don't have full case of it; I'm right there at it"  . Essential hypertension 11/24/2011  . Exertional dyspnea, chronic   . Gout   . History of Unstable angina 11/24/2011   Referred for cardiac catheterization  .  Hyperlipidemia with target LDL less than 70 11/24/2011  . Obesity (BMI 30.0-34.9)   . OSA (obstructive sleep apnea), uses oxygen at home did not tolerate cpap 11/24/2011     Significant Hospital Events   1/5 Intubated  Consults:  none  Procedures:  1/5 Intubation >>  Significant Diagnostic Tests:  Arterial Blood Gas result s/p int:  pO2 66; pCO2 47.3; pH 7.258;  HCO3 21.1, %O2 Sat 85%.   Micro Data:  1/5 Blood culture >> 1/5 Urine culture >> 1/5 resp culture >>   Antimicrobials:  1/5 vanc >> 1/5 1/5 azithro >> 1/5 ceftriaxone >>  Interim history/subjective:  Patient was sedated and intubated in the ED. He has remained 85% saturation with 100% FiO2, PEEP 5, VT 510 and was increased to PEEP 10 In the ED he was bolused 2.5 NS and given vanc, ceftriaxone, and azithromycin  Objective   Blood pressure (!) 143/93, pulse (!) 111, temperature (!) 100.7 F (38.2 C), temperature source Rectal, resp. rate (!) 28, height 5\' 6"  (1.676 m), SpO2 91 %.    Vent Mode: PRVC FiO2 (%):  [40 %-50 %] 50 % Set Rate:  [24 bmp] 24 bmp Vt Set:  [510 mL] 510 mL PEEP:  [5 cmH20] 5 cmH20 Plateau Pressure:  [15 cmH20] 15 cmH20   Intake/Output Summary (Last 24 hours) at 04/21/2018 1605 Last data filed at 04/21/2018 1514 Gross per 24 hour  Intake 1950 ml  Output -  Net 1950 ml  There were no vitals filed for this visit. Marland Kitchen azithromycin Stopped (04/21/18 1514)  . cefTRIAXone (ROCEPHIN)  IV Stopped (04/21/18 1342)  . famotidine (PEPCID) IV    . propofol    . propofol (DIPRIVAN) infusion    . vancomycin    . [START ON 04/22/2018] vancomycin      Examination: General: Intubated, sedated HENT: AT, [place], endotracheal tube in place Lungs: use of respiratory muscles, right rhonchi, otherwise CTA, no wheezing or crackles Cardiovascular: tachycardic, regular rhythm, no m/r/g Abdomen: distended, soft,  Extremities: well perfused, +pedal pulses Skin: c/d/i   Resolved Hospital Problem list      Assessment & Plan:  Paul [name] is a 68yo male with COPD on supplemental O2, CAD s/p stents placed 2013, and HTN presenting after being found confused and SOB, requiring intubation.  Acute Hypoxic Respiratory Failure:   - Full vent support  - VAP prevention  - Sedate to RASS goal -1 - am BMP - Mg, Phos - Triglycerides q72 hours  Sepsis 2/2 RUL Pneumonia:  - S/p 2.5L NS in ED - Trend lactic acid - blood culture pending - urine culture, resp culture pending - am CBC - Continue Ceftriaxone, Azithromycin  - F/u flu panel  - Trend fever curve  Hyperglycemia: CBG 600 on arrival, takes metformin at home  - Checking serum ketones  - S/p 2.5L NS - Insulin gtt  - CBG q1h  AKI: ATN vs. Prerenal  - Monitor I&Os - Trend BMP  HTN: holding home meds - PRN hydral   CAD s/p Stent Placement 2013 - plavix 75 mg qd  Best practice:  Diet: NPO Pain/Anxiety/Delirium protocol (if indicated): sedated VAP protocol (if indicated): ordered DVT prophylaxis: lovenox GI prophylaxis: Pepcid Glucose control: insulin infusion goal 140-180 Mobility: bedrest, intubated Code Status: full Family Communication: spoke with brother and sister Disposition: ICU  Labs   CBC: Recent Labs  Lab 04/21/18 1257  WBC 10.2  NEUTROABS 8.8*  HGB 14.8  HCT 46.9  MCV 93.6  PLT 938    Basic Metabolic Panel: Recent Labs  Lab 04/21/18 1257  NA 132*  K 4.5  CL 96*  CO2 14*  GLUCOSE 626*  BUN 40*  CREATININE 1.76*  CALCIUM 9.1   GFR: CrCl cannot be calculated (Unknown ideal weight.). Recent Labs  Lab 04/21/18 1218 04/21/18 1257  WBC  --  10.2  LATICACIDVEN 5.28*  --     Liver Function Tests: Recent Labs  Lab 04/21/18 1257  AST 28  ALT 29  ALKPHOS 70  BILITOT 1.7*  PROT 7.2  ALBUMIN 2.7*   No results for input(s): LIPASE, AMYLASE in the last 168 hours. No results for input(s): AMMONIA in the last 168 hours.  ABG    Component Value Date/Time   HCO3 16.3 (L) 04/21/2018  1217   TCO2 17 (L) 04/21/2018 1217   ACIDBASEDEF 7.0 (H) 04/21/2018 1217   O2SAT 91.0 04/21/2018 1217     Coagulation Profile: No results for input(s): INR, PROTIME in the last 168 hours.  Cardiac Enzymes: No results for input(s): CKTOTAL, CKMB, CKMBINDEX, TROPONINI in the last 168 hours.  HbA1C: No results found for: HGBA1C  CBG: Recent Labs  Lab 04/21/18 1422  GLUCAP 507*    Review of Systems:   Unable to obtain ROS as patient intubated  Past Medical History  He,  has a past medical history of CAD S/P percutaneous coronary angioplasty -- D1, Xience Xpedition DES 2.5 mm x 33 mm (11/24/2011), COPD (chronic obstructive pulmonary  disease) ([place]), Essential hypertension (11/24/2011), Exertional dyspnea, chronic, Gout, History of Unstable angina (11/24/2011), Hyperlipidemia with target LDL less than 70 (11/24/2011), Obesity (BMI 30.0-34.9), and OSA (obstructive sleep apnea), uses oxygen at home did not tolerate cpap (11/24/2011).   Surgical History    Past Surgical History:  Procedure Laterality Date  . CERVICAL SPINE SURGERY  2012  . CORONARY ANGIOPLASTY WITH STENT PLACEMENT  11/23/2011   "1; first one"  . LEFT HEART CATHETERIZATION WITH CORONARY ANGIOGRAM N/A 11/23/2011   Procedure: LEFT HEART CATHETERIZATION WITH CORONARY ANGIOGRAM;  Surgeon: Leonie Man, MD;  Location: [facility] CATH LAB;  Service: Cardiovascular;  Laterality: N/A;     Social History   reports that he has been smoking cigarettes. He has a 40.00 pack-year smoking history. He has never used smokeless tobacco. He reports current alcohol use. He reports that he does not use drugs.   Family History   His family history includes Aneurysm in his father; Emphysema in his father; Heart attack in his brother; Heart disease in his sister.   Allergies Allergies  Allergen Reactions  . Codeine Nausea And Vomiting  . Duloxetine Hcl Other (See Comments)    Took at night and still felt very lethargic the following day.  (malaise)      Home Medications  Prior to Admission medications   Medication Sig Start Date End Date Taking? Authorizing Provider  albuterol (PROAIR HFA) 108 (90 BASE) MCG/ACT inhaler Inhale 2 puffs into the lungs every 6 (six) hours as needed for wheezing or shortness of breath.    [provider]  albuterol (PROVENTIL) (2.5 MG/3ML) 0.083% nebulizer solution Take 2.5 mg by nebulization every 6 (six) hours as needed for wheezing or shortness of breath.    [provider]  allopurinol (ZYLOPRIM) 300 MG tablet Take 300 mg by mouth daily.    [provider]  amLODipine (NORVASC) 5 MG tablet Take 5 mg by mouth daily.    [provider]  aspirin 325 MG tablet Take 325 mg by mouth daily.    [provider]  atorvastatin (LIPITOR) 20 MG tablet Take 1 tablet (20 mg total) by mouth daily at 6 PM. 08/17/14 11/12/16  Leonie Man, MD  budesonide-formoterol [facility]) 160-4.5 MCG/ACT inhaler Take 2 puffs first thing in am and then another 2 puffs about 12 hours later. 04/13/14   Tanda Rockers, MD  clopidogrel (PLAVIX) 75 MG tablet Take 1 tablet (75 mg total) by mouth daily. 08/17/14   Leonie Man, MD  colchicine (COLCRYS) 0.6 MG tablet Take two tabs by mouth at once then a third tablet one hour later. 04/22/14   Presson, Audelia Hives, PA  cyclobenzaprine (FLEXERIL) 10 MG tablet Take 10 mg by mouth daily.    [provider]  diazepam (VALIUM) 5 MG tablet Take 1 by mouth 1 hour  pre-procedure with very light food. May bring 2nd tablet to appointment. Patient taking differently: Take 5 mg by mouth See admin instructions. Take one tablet (5 mg) by mouth one hour prior to procedure with very light food. May bring 2nd tablet to appointment. 03/25/18   Magnus Sinning, MD  DULoxetine (CYMBALTA) 30 MG capsule Take 30 mg by mouth daily. 03/12/18   [provider]  gabapentin (NEURONTIN) 300 MG capsule Take 300 mg by mouth at bedtime. 03/12/18   [provider]  meclizine (ANTIVERT) 25 MG tablet Take 25 mg by mouth daily. 02/08/16   [provider]  meloxicam ([employer]) 15  MG tablet Take 15 mg by mouth daily.    [provider]  metFORMIN (GLUCOPHAGE-XR) 500 MG 24 hr tablet Take 500 mg by mouth daily with supper. 03/12/18   [provider]  methylphenidate (RITALIN) 10 MG tablet Take 10 mg by mouth daily.     [provider]  metoprolol succinate (TOPROL-XL) 25 MG 24 hr tablet Take 50 mg by mouth daily. 03/12/18   [provider]  nitroGLYCERIN (NITROSTAT) 0.4 MG SL tablet Place 1 tablet (0.4 mg total) under the tongue every 5 (five) minutes as needed for chest pain. 11/24/11 04/13/14  Isaiah Serge, NP  nortriptyline (PAMELOR) 25 MG capsule Take 25 mg by mouth at bedtime.    [provider]  omeprazole (PRILOSEC) 20 MG capsule Take 20 mg by mouth daily.    [provider]  oxyCODONE-acetaminophen (PERCOCET/ROXICET) 5-325 MG per tablet Take 1 tablet by mouth every 6 (six) hours as needed (pain).  02/09/14   [provider]  traMADol (ULTRAM) 50 MG tablet Take 1 tablet (50 mg total) by mouth every 6 (six) hours as needed for moderate pain or severe pain. 04/22/14   Presson, Audelia Hives, PA  zolpidem (AMBIEN) 10 MG tablet Take 10 mg by mouth at bedtime as needed for sleep.     [provider]        Marty Heck, DO 04/21/2018, 4:05 PM  Pager: [phone]

## 2018-04-21 NOTE — Progress Notes (Signed)
eLink Physician-Brief Progress Note Patient Name: Paul [name] DOB: 14-Feb-1951 MRN: [id number]   Date of Service  04/21/2018  HPI/Events of Note  Patient had ketones in UA from 1:50 PM today c/w DKA.   eICU Interventions  Will change insulin orders to DKA insulin IV infusion orders per DKA order set.         Sommer,Steven Eugene 04/21/2018, 10:05 PM

## 2018-04-21 NOTE — ED Triage Notes (Signed)
Pt here from home with c/o resp distress sats in the 80's on room air , pt placed on cpap , arrived on cpap , 5mg  albuterol

## 2018-04-21 NOTE — ED Notes (Signed)
Etomidate 20 mg

## 2018-04-21 NOTE — Progress Notes (Signed)
Pharmacy Antibiotic Note  Gurjit Loconte is a 68 y.o. male admitted on 04/21/2018 with pneumonia.  Pharmacy has been consulted for vancomycin dosing.  Given Rocephin and azithro in the ED.   Plan: Give vancomycin 1.5g IV x 1, then start vancomycin 1g IV Q24h Monitor clinical picture, renal function, vanc levels prn F/U C&S, abx deescalation / LOT    No data recorded.  Recent Labs  Lab 04/21/18 1218  LATICACIDVEN 5.28*    CrCl cannot be calculated (Patient's most recent lab result is older than the maximum 21 days allowed.).    Allergies  Allergen Reactions  . Codeine Other (See Comments)    U"think it made me sick"  . Duloxetine Hcl     Other reaction(s): Malaise (intolerance) Took at night and still felt very lethargic the following day.      Thank you for allowing pharmacy to be a part of this patient's care.  Reginia Naas 04/21/2018 12:45 PM

## 2018-04-22 ENCOUNTER — Inpatient Hospital Stay (HOSPITAL_COMMUNITY): Payer: Medicare Other

## 2018-04-22 DIAGNOSIS — J9601 Acute respiratory failure with hypoxia: Secondary | ICD-10-CM

## 2018-04-22 DIAGNOSIS — N179 Acute kidney failure, unspecified: Secondary | ICD-10-CM

## 2018-04-22 LAB — BLOOD GAS, ARTERIAL
Acid-base deficit: 1.5 mmol/L (ref 0.0–2.0)
Bicarbonate: 22.3 mmol/L (ref 20.0–28.0)
Drawn by: 347621
FIO2: 0.9
MECHVT: 510 mL
O2 Saturation: 98.7 %
PEEP: 10 cmH2O
Patient temperature: 101.1
RATE: 24 resp/min
pCO2 arterial: 37.6 mmHg (ref 32.0–48.0)
pH, Arterial: 7.398 (ref 7.350–7.450)
pO2, Arterial: 197 mmHg — ABNORMAL HIGH (ref 83.0–108.0)

## 2018-04-22 LAB — RESPIRATORY PANEL BY PCR

## 2018-04-22 LAB — BASIC METABOLIC PANEL
Anion gap: 11 (ref 5–15)
Anion gap: 8 (ref 5–15)
Anion gap: 11 (ref 5–15)
BUN: 44 mg/dL — ABNORMAL HIGH (ref 8–23)
BUN: 42 mg/dL — ABNORMAL HIGH (ref 8–23)
BUN: 43 mg/dL — ABNORMAL HIGH (ref 8–23)
CO2: 17 mmol/L — ABNORMAL LOW (ref 22–32)
CO2: 19 mmol/L — ABNORMAL LOW (ref 22–32)
CO2: 20 mmol/L — ABNORMAL LOW (ref 22–32)
Calcium: 8.4 mg/dL — ABNORMAL LOW (ref 8.9–10.3)
Calcium: 8.4 mg/dL — ABNORMAL LOW (ref 8.9–10.3)
Calcium: 8.1 mg/dL — ABNORMAL LOW (ref 8.9–10.3)
Chloride: 116 mmol/L — ABNORMAL HIGH (ref 98–111)
Chloride: 114 mmol/L — ABNORMAL HIGH (ref 98–111)
Chloride: 111 mmol/L (ref 98–111)
Creatinine, Ser: 1.68 mg/dL — ABNORMAL HIGH (ref 0.61–1.24)
Creatinine, Ser: 1.67 mg/dL — ABNORMAL HIGH (ref 0.61–1.24)
Creatinine, Ser: 1.75 mg/dL — ABNORMAL HIGH (ref 0.61–1.24)
GFR calc Af Amer: 47 mL/min — ABNORMAL LOW (ref >60)
GFR calc Af Amer: 48 mL/min — ABNORMAL LOW (ref >60)
GFR calc Af Amer: 45 mL/min — ABNORMAL LOW (ref >60)
GFR calc non Af Amer: 41 mL/min — ABNORMAL LOW (ref >60)
GFR calc non Af Amer: 41 mL/min — ABNORMAL LOW (ref >60)
GFR calc non Af Amer: 39 mL/min — ABNORMAL LOW (ref >60)
Glucose, Bld: 121 mg/dL — ABNORMAL HIGH (ref 70–99)
Glucose, Bld: 198 mg/dL — ABNORMAL HIGH (ref 70–99)
Glucose, Bld: 161 mg/dL — ABNORMAL HIGH (ref 70–99)
Potassium: 4.3 mmol/L (ref 3.5–5.1)
Potassium: 3.9 mmol/L (ref 3.5–5.1)
Potassium: 3.7 mmol/L (ref 3.5–5.1)
Sodium: 144 mmol/L (ref 135–145)
Sodium: 141 mmol/L (ref 135–145)
Sodium: 142 mmol/L (ref 135–145)

## 2018-04-22 LAB — GLUCOSE, CAPILLARY
Glucose-Capillary: 113 mg/dL — ABNORMAL HIGH (ref 70–99)
Glucose-Capillary: 121 mg/dL — ABNORMAL HIGH (ref 70–99)
Glucose-Capillary: 139 mg/dL — ABNORMAL HIGH (ref 70–99)
Glucose-Capillary: 144 mg/dL — ABNORMAL HIGH (ref 70–99)
Glucose-Capillary: 145 mg/dL — ABNORMAL HIGH (ref 70–99)
Glucose-Capillary: 147 mg/dL — ABNORMAL HIGH (ref 70–99)
Glucose-Capillary: 152 mg/dL — ABNORMAL HIGH (ref 70–99)
Glucose-Capillary: 155 mg/dL — ABNORMAL HIGH (ref 70–99)
Glucose-Capillary: 164 mg/dL — ABNORMAL HIGH (ref 70–99)
Glucose-Capillary: 167 mg/dL — ABNORMAL HIGH (ref 70–99)
Glucose-Capillary: 176 mg/dL — ABNORMAL HIGH (ref 70–99)
Glucose-Capillary: 179 mg/dL — ABNORMAL HIGH (ref 70–99)
Glucose-Capillary: 181 mg/dL — ABNORMAL HIGH (ref 70–99)
Glucose-Capillary: 186 mg/dL — ABNORMAL HIGH (ref 70–99)
Glucose-Capillary: 193 mg/dL — ABNORMAL HIGH (ref 70–99)
Glucose-Capillary: 198 mg/dL — ABNORMAL HIGH (ref 70–99)
Glucose-Capillary: 251 mg/dL — ABNORMAL HIGH (ref 70–99)
Glucose-Capillary: 302 mg/dL — ABNORMAL HIGH (ref 70–99)

## 2018-04-22 LAB — CBC
HCT: 42.4 % (ref 39.0–52.0)
Hemoglobin: 13.6 g/dL (ref 13.0–17.0)
MCH: 30.2 pg (ref 26.0–34.0)
MCHC: 32.1 g/dL (ref 30.0–36.0)
MCV: 94.2 fL (ref 80.0–100.0)
Platelets: 180 10*3/uL (ref 150–400)
RBC: 4.5 MIL/uL (ref 4.22–5.81)
RDW: 13.2 % (ref 11.5–15.5)
WBC: 8.1 10*3/uL (ref 4.0–10.5)
nRBC: 0.5 % — ABNORMAL HIGH (ref 0.0–0.2)

## 2018-04-22 LAB — BLOOD CULTURE ID PANEL (REFLEXED)

## 2018-04-22 LAB — PHOSPHORUS
Phosphorus: 3.6 mg/dL (ref 2.5–4.6)
Phosphorus: 2.8 mg/dL (ref 2.5–4.6)

## 2018-04-22 LAB — MAGNESIUM
Magnesium: 2.5 mg/dL — ABNORMAL HIGH (ref 1.7–2.4)
Magnesium: 2.5 mg/dL — ABNORMAL HIGH (ref 1.7–2.4)

## 2018-04-22 LAB — HIV ANTIBODY (ROUTINE TESTING W REFLEX): HIV Screen 4th Generation wRfx: NONREACTIVE

## 2018-04-22 LAB — MRSA PCR SCREENING: MRSA by PCR: NEGATIVE

## 2018-04-22 MED ORDER — DEXTROSE 10 % IV SOLN: INTRAVENOUS | Status: DC | PRN

## 2018-04-22 MED ORDER — INSULIN ASPART 100 UNIT/ML ~~LOC~~ SOLN
0.0000 [IU] | SUBCUTANEOUS | Status: DC
  Administered 2018-04-22: 11 [IU] via SUBCUTANEOUS
  Administered 2018-04-22 – 2018-04-23 (×2): 8 [IU] via SUBCUTANEOUS

## 2018-04-22 MED ORDER — SODIUM CHLORIDE 0.9 % IV SOLN
INTRAVENOUS | Status: DC
  Administered 2018-04-22 – 2018-04-23 (×2): via INTRAVENOUS

## 2018-04-22 MED ORDER — INSULIN DETEMIR 100 UNIT/ML ~~LOC~~ SOLN
8.0000 [IU] | Freq: Two times a day (BID) | SUBCUTANEOUS | Status: DC
  Administered 2018-04-22: 8 [IU] via SUBCUTANEOUS
  Filled 2018-04-22 (×3): qty 0.08

## 2018-04-22 MED ORDER — INSULIN ASPART 100 UNIT/ML ~~LOC~~ SOLN
2.0000 [IU] | SUBCUTANEOUS | Status: DC
  Administered 2018-04-22: 2 [IU] via SUBCUTANEOUS

## 2018-04-22 MED ORDER — VITAL HIGH PROTEIN PO LIQD
1000.0000 mL | ORAL | Status: DC
  Administered 2018-04-22 – 2018-04-26 (×3): 1000 mL

## 2018-04-22 MED ORDER — ARFORMOTEROL TARTRATE 15 MCG/2ML IN NEBU
15.0000 ug | INHALATION_SOLUTION | Freq: Two times a day (BID) | RESPIRATORY_TRACT | Status: DC
  Administered 2018-04-22 – 2018-05-03 (×23): 15 ug via RESPIRATORY_TRACT
  Filled 2018-04-22 (×24): qty 2

## 2018-04-22 MED ORDER — BUDESONIDE 0.25 MG/2ML IN SUSP
0.2500 mg | Freq: Two times a day (BID) | RESPIRATORY_TRACT | Status: DC
  Administered 2018-04-22 – 2018-04-27 (×11): 0.25 mg via RESPIRATORY_TRACT
  Filled 2018-04-22 (×11): qty 2

## 2018-04-22 MED ORDER — INSULIN ASPART 100 UNIT/ML ~~LOC~~ SOLN
2.0000 [IU] | SUBCUTANEOUS | Status: DC
  Administered 2018-04-22: 4 [IU] via SUBCUTANEOUS

## 2018-04-22 NOTE — Progress Notes (Signed)
Received call from microbiology. Pt positive for RSV. Covering CCM Resident paged.

## 2018-04-22 NOTE — Progress Notes (Signed)
PHARMACY - PHYSICIAN COMMUNICATION CRITICAL VALUE ALERT - BLOOD CULTURE IDENTIFICATION (BCID)  Mccauley Diehl is an 68 y.o. male who presented to [facility] on 04/21/2018 with a chief complaint of dyspnea/confusion   Name of physician (or Provider) Contacted: Dr. Oletta Darter  Current antibiotics: Ceftriaxone/Azithromycin   Changes to prescribed antibiotics recommended:  No changes for now  Results for orders placed or performed during the hospital encounter of 04/21/18  Blood Culture ID Panel (Reflexed) (Collected: 04/21/2018 11:56 AM)  Result Value Ref Range   Enterococcus species NOT DETECTED NOT DETECTED   Listeria monocytogenes NOT DETECTED NOT DETECTED   Staphylococcus species NOT DETECTED NOT DETECTED   Staphylococcus aureus (BCID) NOT DETECTED NOT DETECTED   Streptococcus species DETECTED (A) NOT DETECTED   Streptococcus agalactiae NOT DETECTED NOT DETECTED   Streptococcus pneumoniae DETECTED (A) NOT DETECTED   Streptococcus pyogenes NOT DETECTED NOT DETECTED   Acinetobacter baumannii NOT DETECTED NOT DETECTED   Enterobacteriaceae species NOT DETECTED NOT DETECTED   Enterobacter cloacae complex NOT DETECTED NOT DETECTED   Escherichia coli NOT DETECTED NOT DETECTED   Klebsiella oxytoca NOT DETECTED NOT DETECTED   Klebsiella pneumoniae NOT DETECTED NOT DETECTED   Proteus species NOT DETECTED NOT DETECTED   Serratia marcescens NOT DETECTED NOT DETECTED   Haemophilus influenzae NOT DETECTED NOT DETECTED   Neisseria meningitidis NOT DETECTED NOT DETECTED   Pseudomonas aeruginosa NOT DETECTED NOT DETECTED   Candida albicans NOT DETECTED NOT DETECTED   Candida glabrata NOT DETECTED NOT DETECTED   Candida krusei NOT DETECTED NOT DETECTED   Candida parapsilosis NOT DETECTED NOT DETECTED   Candida tropicalis NOT DETECTED NOT DETECTED    Narda Bonds 04/22/2018  6:59 AM

## 2018-04-22 NOTE — Progress Notes (Signed)
eLink Physician-Brief Progress Note Patient Name: Paul [name] DOB: 16-Nov-1950 MRN: [id number]   Date of Service  04/22/2018  HPI/Events of Note  Hyperglycemia - Blood glucose = 251. Currently on standard Novolog SSI.  eICU Interventions  Will order: 1. Change to Q 4 hour moderate SSI.     Intervention Category Major Interventions: Hyperglycemia - active titration of insulin therapy  Encarnacion Scioneaux Eugene 04/22/2018, 8:20 PM

## 2018-04-22 NOTE — Procedures (Signed)
Intubation Procedure Note Paul [name] [id number] 1950-08-08  Procedure: Intubation Indications: Respiratory insufficiency  Procedure Details Consent: Obtained verbally from family Time Out: Verified patient identification, verified procedure, site/side was marked, verified correct patient position, special equipment/implants available, medications/allergies/relevent history reviewed, required imaging and test results available.  Performed  Drugs Etomidate 20mg , Rocuronium 63m DL x 1 with GS3 blade Grade 1 view 7.5 ET tube passed through cords under direct visualization Placement confirmed with bilateral breath sounds, positive EtCO2 change and smoke in tube   Evaluation Hemodynamic Status: BP stable throughout; O2 sats: stable throughout Patient's Current Condition: stable Complications: No apparent complications Patient did tolerate procedure well. Chest X-ray ordered to verify placement.  CXR: pending.   Paul [name] 04/22/2018

## 2018-04-22 NOTE — Progress Notes (Signed)
Initial Nutrition Assessment  DOCUMENTATION CODES:   Not applicable  INTERVENTION:   Tube Feeding:  Vital High Protein @ 70 ml/hr Provides 1680 kcals, 148 g of protein and 1411 mL of free water  TF regimen and propofol at current rate providing 1999 total kcal/day (100 % of kcal needs)   NUTRITION DIAGNOSIS:   Inadequate oral intake related to acute illness as evidenced by NPO status.  GOAL:   Patient will meet greater than or equal to 90% of their needs   MONITOR:   Vent status, Labs, Weight trends  REASON FOR ASSESSMENT:   Ventilator    ASSESSMENT:   68 yo male admitted with acute respiratory failure related to RUL pneumonia requiring intubation on admission, AKI, DKA. PMH includes COPD on home oxygen, CAD, HTN, DM  Patient is currently intubated on ventilator support MV: 14 L/min Temp (24hrs), Avg:99.9 F (37.7 C), Min:99.3 F (37.4 C), Max:101.2 F (38.4 C)  Propofol: 12.1 ml/hr  OG tube with tip in stomach; 200-300 mL bilious output in cannister  Pt sedated; no family at bedside. Unable to obtain diet and weight history at this time.   Current wt 81.7 kg; net +2 L. Per weight encounters, pt does not appear to have any significant recent wt loss  Noted LE muscle wasting but no other areas of muscle wasting on exam. Unsure of pt's activity level prior to admission  Labs: Creatinine 1.67, BUN 42; CBGs 193-457, TG 202 Meds: insulin drip  NUTRITION - FOCUSED PHYSICAL EXAM:    Most Recent Value  Orbital Region  Mild depletion  Upper Arm Region  Moderate depletion  Thoracic and Lumbar Region  No depletion  Buccal Region  Unable to assess  Temple Region  No depletion  Clavicle Bone Region  No depletion  Clavicle and Acromion Bone Region  No depletion  Scapular Bone Region  No depletion  Dorsal Hand  Unable to assess  Patellar Region  Mild depletion  Anterior Thigh Region  Moderate depletion  Posterior Calf Region  Moderate depletion  Edema (RD  Assessment)  None       Diet Order:   Diet Order            Diet NPO time specified  Diet effective now              EDUCATION NEEDS:   Not appropriate for education at this time  Skin:  Skin Assessment: Reviewed RN Assessment  Last BM:  1/06  Height:   Ht Readings from Last 1 Encounters:  04/21/18 5\' 6"  (1.676 m)    Weight:   Wt Readings from Last 1 Encounters:  04/22/18 81.7 kg    Ideal Body Weight:  64.5 kg  BMI:  Body mass index is 29.07 kg/m.  Estimated Nutritional Needs:   Kcal:  2100 kcals   Protein:  120-140 g  Fluid:  >/= 2 L   Kerman Passey MS, RD, LDN, CNSC [phone] Pager  [phone] Weekend/On-Call Pager

## 2018-04-22 NOTE — Progress Notes (Signed)
68 year old man with COPD on home oxygen admitted 1/5 with right upper lobe community-acquired pneumonia requiring mechanical ventilation. FEV1 81% 2015  He is low-grade febrile, sedated on propofol and fentanyl, FiO2 down to 50%, vent waveforms did not show any evidence of auto PEEP, prolonged expiration bilateral, decreased breath sounds right upper, S1-S2 normal, soft nontender abdomen  Labs show increase in creatinine to 1.7, normal electrolytes, no leukocytosis  Chest x-ray personally reviewed which shows right upper lobe consolidation with patchy infiltrate right lower lobe, left lung appears clear  Impression/plan Acute hypoxic respiratory failure-now down to 50%, tolerating respiratory rate of 24 without auto PEEP, proceed with spontaneous breathing trials but expect will need a longer wean  Right upper lobe pneumococcal pneumonia with bacteremia-continue ceftriaxone for 2 weeks, can discontinue azithromycin, obtain respiratory viral panel for completion  Underlying COPD-Pulmicort and Brovana nebs, no bronchospasm hence does not need steroids  AKI -related to above, expect to improve, avoid nephrotoxins DKA -switch to phase 3 after giving Lantus and 2 hours overlap with insulin drip now that anion gap resolved and use SSI resistant scale  My independent critical care time x 38m  Kara Mead MD. [facility]. [place] [facility] Pager [phone] If no response call [phone]   04/22/2018

## 2018-04-22 NOTE — Progress Notes (Signed)
Inpatient Diabetes Program Recommendations  AACE/ADA: New Consensus Statement on Inpatient Glycemic Control (2015)  Target Ranges:  Prepandial:   less than 140 mg/dL      Peak postprandial:   less than 180 mg/dL (1-2 hours)      Critically ill patients:  140 - 180 mg/dL   Lab Results  Component Value Date   GLUCAP 176 (H) 04/22/2018    Review of Glycemic Control  Diabetes history: DM2 Outpatient Diabetes medications: Metformin 500 mg q pm Current orders for Inpatient glycemic control: IV insulin drip  Inpatient Diabetes Program Recommendations:   Noted pending A1c. Will follow.  Thank you, Nani Gasser. Jaquis Picklesimer, RN, MSN, CDE  Diabetes Coordinator Inpatient Glycemic Control Team Team Pager [phone] (8am-5pm) 04/22/2018 9:00 AM

## 2018-04-22 NOTE — Progress Notes (Signed)
NAME:  Paul [name], MRN:  [id number], DOB:  1950/06/27, LOS: 1 ADMISSION DATE:  04/21/2018, CONSULTATION DATE:  1/5 REFERRING MD:  EDP, CHIEF COMPLAINT:  AMS  Brief History   68yo male with COPD on supplemental O2, CAD s/p stents 2013 presenting after being found confused and dyspneic by family. Profoundly hypoxic in ED. Intubated and found to have RUL pneumonia.  Past Medical History  COPD on supplemental O2   Significant Hospital Events   1/5 > Intubated, admit   Consults:    Procedures:  1/5 ETT >   Significant Diagnostic Tests:  1/5 > CXR with RUL pneumonia   Micro Data:  1/5 Blood culture >> Strep Pneumo 1/5 Urine culture >> pending 1/5 resp culture >> pending   Antimicrobials:  1/5 vanc >> 1/5 1/5 azithro >> 1/5 ceftriaxone >>  Interim history/subjective:  Sedated on full vent support.   Objective   Blood pressure 101/80, pulse (!) 111, temperature 100.1 F (37.8 C), temperature source Oral, resp. rate (!) 27, height 5\' 6"  (1.676 m), weight 81.7 kg, SpO2 96 %.    Vent Mode: PRVC FiO2 (%):  [40 %-100 %] 60 % Set Rate:  [24 bmp] 24 bmp Vt Set:  [510 mL] 510 mL PEEP:  [5 cmH20-10 cmH20] 5 cmH20 Plateau Pressure:  [15 cmH20-17 cmH20] 16 cmH20   Intake/Output Summary (Last 24 hours) at 04/22/2018 0734 Last data filed at 04/22/2018 0700 Gross per 24 hour  Intake 2972.51 ml  Output 705 ml  Net 2267.51 ml   Filed Weights   04/21/18 1600 04/22/18 0500  Weight: 80.7 kg 81.7 kg    Examination: General: Intubated, sedated HENT: AT, [place], endotracheal tube in place Lungs: use of respiratory muscles, right rhonchi, otherwise CTA, no wheezing or crackles Cardiovascular: tachycardic, regular rhythm, no m/r/g Abdomen: distended, soft,  Extremities: well perfused, +pedal pulses Skin: c/d/i   Resolved Hospital Problem list     Assessment & Plan:   Mr. Boeckman is a 68yo male with COPD on supplemental O2, CAD s/p stents placed 2013, and HTN presenting after being  found confused and dyspneic by family. Profoundly hypoxic on arrival requiring intubation.  Acute Hypoxic Respiratory Failure: Secondary to RUL PNA - Full vent support  - VAP prevention  - Daily WUA / SBT - Sedate to RASS goal -1  Sepsis: From strep pneumo bacteremia, RUL PNA  - Continue Ceftriaxone, f/u sensitives  - DC Azithromycin  - Lactic acid improved - Trend fever curve  DKA / HHS: CBG 600 on arrival, mild elevation in serum ketones, with anion gap. Improved with IVFs and insulin drip.  takes metformin at home.  - DC insulin drip pending am labs, transition to SSI - D51/2 NS @ 75  AKI: ATN vs. Prerenal  - Monitor I&Os - Trend BMP  HTN: holding home meds - PRN hydral  CAD s/p Stent Placement 2013 - plavix 75 mg qd  Best practice:  Diet: NPO Pain/Anxiety/Delirium protocol (if indicated): sedated VAP protocol (if indicated): ordered DVT prophylaxis: lovenox GI prophylaxis: Pepcid Glucose control: insulin infusion goal 140-180 Mobility: bedrest, intubated Code Status: full Family Communication: Daughter Claiborne Billings updated at bedside Disposition: ICU   Labs   CBC: Recent Labs  Lab 04/21/18 1257 04/21/18 1942 04/22/18 0422  WBC 10.2 7.0 8.1  NEUTROABS 8.8*  --   --   HGB 14.8 13.2 13.6  HCT 46.9 41.3 42.4  MCV 93.6 93.4 94.2  PLT 243 190 630    Basic Metabolic Panel: Recent  Labs  Lab 04/21/18 1257 04/21/18 1942 04/21/18 2311 04/22/18 0422  NA 132*  --  139 PENDING  K 4.5  --  4.3 PENDING  CL 96*  --  108 PENDING  CO2 14*  --  19* PENDING  GLUCOSE 626*  --  258* 121*  BUN 40*  --  44* 44*  CREATININE 1.76* 1.57* 1.59* 1.68*  CALCIUM 9.1  --  8.3* PENDING  MG  --   --  2.4 2.5*  PHOS  --   --  3.1 3.6   GFR: Estimated Creatinine Clearance: 41.7 mL/min (A) (by C-G formula based on SCr of 1.68 mg/dL (H)). Recent Labs  Lab 04/21/18 1218 04/21/18 1257 04/21/18 1942 04/21/18 2311 04/22/18 0422  WBC  --  10.2 7.0  --  8.1  LATICACIDVEN  5.28*  --  2.9* 2.9*  --     Liver Function Tests: Recent Labs  Lab 04/21/18 1257  AST 28  ALT 29  ALKPHOS 70  BILITOT 1.7*  PROT 7.2  ALBUMIN 2.7*   No results for input(s): LIPASE, AMYLASE in the last 168 hours. No results for input(s): AMMONIA in the last 168 hours.  ABG    Component Value Date/Time   PHART 7.398 04/22/2018 0410   PCO2ART 37.6 04/22/2018 0410   PO2ART 197 (H) 04/22/2018 0410   HCO3 22.3 04/22/2018 0410   TCO2 23 04/21/2018 1620   ACIDBASEDEF 1.5 04/22/2018 0410   O2SAT 98.7 04/22/2018 0410     Coagulation Profile: No results for input(s): INR, PROTIME in the last 168 hours.  Cardiac Enzymes: No results for input(s): CKTOTAL, CKMB, CKMBINDEX, TROPONINI in the last 168 hours.  HbA1C: No results found for: HGBA1C  CBG: Recent Labs  Lab 04/22/18 0237 04/22/18 0347 04/22/18 0445 04/22/18 0548 04/22/18 0646  GLUCAP 186* 121* 113* 147* 155*    Past Medical History  He,  has a past medical history of CAD S/P percutaneous coronary angioplasty -- D1, Xience Xpedition DES 2.5 mm x 33 mm (11/24/2011), COPD (chronic obstructive pulmonary disease) ([place]), Essential hypertension (11/24/2011), Exertional dyspnea, chronic, Gout, History of Unstable angina (11/24/2011), Hyperlipidemia with target LDL less than 70 (11/24/2011), Obesity (BMI 30.0-34.9), and OSA (obstructive sleep apnea), uses oxygen at home did not tolerate cpap (11/24/2011).   Surgical History    Past Surgical History:  Procedure Laterality Date  . CERVICAL SPINE SURGERY  2012  . CORONARY ANGIOPLASTY WITH STENT PLACEMENT  11/23/2011   "1; first one"  . LEFT HEART CATHETERIZATION WITH CORONARY ANGIOGRAM N/A 11/23/2011   Procedure: LEFT HEART CATHETERIZATION WITH CORONARY ANGIOGRAM;  Surgeon: Leonie Man, MD;  [facility];  Service: Cardiovascular;  Laterality: N/A;     Social History   reports that he has been smoking cigarettes. He has a 40.00 pack-year smoking history. He has never  used smokeless tobacco. He reports current alcohol use. He reports that he does not use drugs.   Family History   His family history includes Aneurysm in his father; Emphysema in his father; Heart attack in his brother; Heart disease in his sister.   Allergies Allergies  Allergen Reactions  . Codeine Nausea And Vomiting  . Duloxetine Hcl Other (See Comments)    Took at night and still felt very lethargic the following day.  (malaise)     Home Medications  Prior to Admission medications   Medication Sig Start Date End Date Taking? Authorizing Provider  albuterol (PROAIR HFA) 108 (90 BASE) MCG/ACT inhaler Inhale 2  puffs into the lungs every 6 (six) hours as needed for wheezing or shortness of breath.    [provider]  albuterol (PROVENTIL) (2.5 MG/3ML) 0.083% nebulizer solution Take 2.5 mg by nebulization every 6 (six) hours as needed for wheezing or shortness of breath.    [provider]  allopurinol (ZYLOPRIM) 300 MG tablet Take 300 mg by mouth daily.    [provider]  amLODipine (NORVASC) 5 MG tablet Take 5 mg by mouth daily.    [provider]  aspirin 325 MG tablet Take 325 mg by mouth daily.    [provider]  atorvastatin (LIPITOR) 20 MG tablet Take 1 tablet (20 mg total) by mouth daily at 6 PM. 08/17/14 11/12/16  Leonie Man, MD  budesonide-formoterol [facility]) 160-4.5 MCG/ACT inhaler Take 2 puffs first thing in am and then another 2 puffs about 12 hours later. 04/13/14   Tanda Rockers, MD  clopidogrel (PLAVIX) 75 MG tablet Take 1 tablet (75 mg total) by mouth daily. 08/17/14   Leonie Man, MD  colchicine (COLCRYS) 0.6 MG tablet Take two tabs by mouth at once then a third tablet one hour later. Patient taking differently: Take 0.6 mg by mouth daily. Take two tabs by mouth at once then a third tablet one hour later. 04/22/14   Presson, Audelia Hives, PA  cyclobenzaprine (FLEXERIL) 10 MG tablet Take 10 mg by mouth daily.     [provider]  diazepam (VALIUM) 5 MG tablet Take 1 by mouth 1 hour  pre-procedure with very light food. May bring 2nd tablet to appointment. Patient taking differently: Take 5 mg by mouth See admin instructions. Take one tablet (5 mg) by mouth one hour prior to procedure with very light food. May bring 2nd tablet to appointment. 03/25/18   Magnus Sinning, MD  DULoxetine (CYMBALTA) 30 MG capsule Take 30 mg by mouth daily. 03/12/18   [provider]  gabapentin (NEURONTIN) 300 MG capsule Take 300 mg by mouth at bedtime. 03/12/18   [provider]  meclizine (ANTIVERT) 25 MG tablet Take 25 mg by mouth daily. 02/08/16   [provider]  meloxicam (MOBIC) 15 MG tablet Take 15 mg by mouth daily.    [provider]  metFORMIN (GLUCOPHAGE-XR) 500 MG 24 hr tablet Take 500 mg by mouth daily with supper. 03/12/18   [provider]  methylphenidate (RITALIN) 10 MG tablet Take 10 mg by mouth daily.     [provider]  metoprolol succinate (TOPROL-XL) 25 MG 24 hr tablet Take 50 mg by mouth daily. 03/12/18   [provider]  nitroGLYCERIN (NITROSTAT) 0.4 MG SL tablet Place 1 tablet (0.4 mg total) under the tongue every 5 (five) minutes as needed for chest pain. 11/24/11 04/13/14  Isaiah Serge, NP  nortriptyline (PAMELOR) 25 MG capsule Take 25 mg by mouth at bedtime.    [provider]  omeprazole (PRILOSEC) 20 MG capsule Take 20 mg by mouth daily.    [provider]  oxyCODONE-acetaminophen (PERCOCET/ROXICET) 5-325 MG per tablet Take 1 tablet by mouth every 6 (six) hours as needed (pain).  02/09/14   [provider]  traMADol (ULTRAM) 50 MG tablet Take 1 tablet (50 mg total) by mouth every 6 (six) hours as needed for moderate pain or severe pain. 04/22/14   Presson, Audelia Hives, PA  zolpidem (AMBIEN) 10 MG tablet Take 10 mg by mouth at bedtime as needed for sleep.  [provider]     Critical  care time:

## 2018-04-23 ENCOUNTER — Inpatient Hospital Stay (HOSPITAL_COMMUNITY): Payer: Medicare Other

## 2018-04-23 DIAGNOSIS — E1165 Type 2 diabetes mellitus with hyperglycemia: Secondary | ICD-10-CM

## 2018-04-23 LAB — BASIC METABOLIC PANEL
Anion gap: 9 (ref 5–15)
BUN: 47 mg/dL — ABNORMAL HIGH (ref 8–23)
CO2: 21 mmol/L — ABNORMAL LOW (ref 22–32)
Calcium: 8.1 mg/dL — ABNORMAL LOW (ref 8.9–10.3)
Chloride: 111 mmol/L (ref 98–111)
Creatinine, Ser: 1.53 mg/dL — ABNORMAL HIGH (ref 0.61–1.24)
GFR calc Af Amer: 53 mL/min — ABNORMAL LOW (ref >60)
GFR calc non Af Amer: 46 mL/min — ABNORMAL LOW (ref >60)
Glucose, Bld: 342 mg/dL — ABNORMAL HIGH (ref 70–99)
Potassium: 3.9 mmol/L (ref 3.5–5.1)
Sodium: 141 mmol/L (ref 135–145)

## 2018-04-23 LAB — URINE CULTURE: Culture: NO GROWTH

## 2018-04-23 LAB — CBC
HCT: 38.4 % — ABNORMAL LOW (ref 39.0–52.0)
Hemoglobin: 12 g/dL — ABNORMAL LOW (ref 13.0–17.0)
MCH: 29.7 pg (ref 26.0–34.0)
MCHC: 31.3 g/dL (ref 30.0–36.0)
MCV: 95 fL (ref 80.0–100.0)
Platelets: 172 10*3/uL (ref 150–400)
RBC: 4.04 MIL/uL — ABNORMAL LOW (ref 4.22–5.81)
RDW: 13.6 % (ref 11.5–15.5)
WBC: 10.2 10*3/uL (ref 4.0–10.5)
nRBC: 0.2 % (ref 0.0–0.2)

## 2018-04-23 LAB — MAGNESIUM
Magnesium: 2.5 mg/dL — ABNORMAL HIGH (ref 1.7–2.4)
Magnesium: 2.1 mg/dL (ref 1.7–2.4)

## 2018-04-23 LAB — GLUCOSE, CAPILLARY
Glucose-Capillary: 260 mg/dL — ABNORMAL HIGH (ref 70–99)
Glucose-Capillary: 325 mg/dL — ABNORMAL HIGH (ref 70–99)
Glucose-Capillary: 287 mg/dL — ABNORMAL HIGH (ref 70–99)
Glucose-Capillary: 270 mg/dL — ABNORMAL HIGH (ref 70–99)
Glucose-Capillary: 268 mg/dL — ABNORMAL HIGH (ref 70–99)

## 2018-04-23 LAB — PHOSPHORUS
Phosphorus: 3.2 mg/dL (ref 2.5–4.6)
Phosphorus: 3 mg/dL (ref 2.5–4.6)

## 2018-04-23 MED ORDER — INSULIN DETEMIR 100 UNIT/ML ~~LOC~~ SOLN
10.0000 [IU] | Freq: Every day | SUBCUTANEOUS | Status: DC
  Administered 2018-04-23: 10 [IU] via SUBCUTANEOUS
  Filled 2018-04-23: qty 0.1

## 2018-04-23 MED ORDER — INSULIN ASPART 100 UNIT/ML ~~LOC~~ SOLN
5.0000 [IU] | SUBCUTANEOUS | Status: DC
  Administered 2018-04-23 – 2018-04-24 (×5): 5 [IU] via SUBCUTANEOUS

## 2018-04-23 MED ORDER — INSULIN DETEMIR 100 UNIT/ML ~~LOC~~ SOLN: 8.0000 [IU] | Freq: Every day | SUBCUTANEOUS | Status: DC

## 2018-04-23 MED ORDER — INSULIN ASPART 100 UNIT/ML ~~LOC~~ SOLN
0.0000 [IU] | SUBCUTANEOUS | Status: DC
  Administered 2018-04-23 (×2): 11 [IU] via SUBCUTANEOUS
  Administered 2018-04-23: 15 [IU] via SUBCUTANEOUS
  Administered 2018-04-23: 11 [IU] via SUBCUTANEOUS
  Administered 2018-04-24: 4 [IU] via SUBCUTANEOUS
  Administered 2018-04-24 (×2): 7 [IU] via SUBCUTANEOUS
  Administered 2018-04-24 (×3): 11 [IU] via SUBCUTANEOUS
  Administered 2018-04-25 (×3): 4 [IU] via SUBCUTANEOUS
  Administered 2018-04-25: 7 [IU] via SUBCUTANEOUS
  Administered 2018-04-25 – 2018-04-26 (×2): 11 [IU] via SUBCUTANEOUS
  Administered 2018-04-26: 7 [IU] via SUBCUTANEOUS
  Administered 2018-04-26: 4 [IU] via SUBCUTANEOUS
  Administered 2018-04-26: 3 [IU] via SUBCUTANEOUS
  Administered 2018-04-26 – 2018-04-27 (×2): 4 [IU] via SUBCUTANEOUS
  Administered 2018-04-27: 11 [IU] via SUBCUTANEOUS
  Administered 2018-04-27: 7 [IU] via SUBCUTANEOUS
  Administered 2018-04-27 – 2018-04-28 (×2): 4 [IU] via SUBCUTANEOUS

## 2018-04-23 MED ORDER — METOPROLOL TARTRATE 25 MG PO TABS
25.0000 mg | ORAL_TABLET | Freq: Two times a day (BID) | ORAL | Status: DC
  Administered 2018-04-23 – 2018-04-24 (×4): 25 mg via ORAL
  Filled 2018-04-23 (×4): qty 1

## 2018-04-23 NOTE — Progress Notes (Addendum)
NAME:  Paul [name], MRN:  [id number], DOB:  Aug 09, 1950, LOS: 2 ADMISSION DATE:  04/21/2018, CONSULTATION DATE:  1/5 REFERRING MD:  EDP, CHIEF COMPLAINT:  AMS  Brief History   68yo male with COPD on supplemental O2, CAD s/p stents 2013 presenting after being found confused and dyspneic by family. Profoundly hypoxic in ED. Intubated and found to have RUL pneumonia, strep pneumo bacteremia, and DKA.   Past Medical History  HTN CAD COPD on supplemental O2 OSA on CPAP Prediabetic   Significant Hospital Events   1/5 > Intubated, admit   Consults:    Procedures:  1/5 ETT >   Significant Diagnostic Tests:  1/5 > CXR with RUL pneumonia   Micro Data:  1/5 Blood culture >> Strep Pneumo 1/5 Urine culture >> pending 1/5 resp culture >> pending  1/6 RVP >> + RSV  Antimicrobials:  1/5 vanc >> 1/5 1/5 azithro >> DC'd 1/6 1/5 ceftriaxone >>  Interim history/subjective:  Sedated on full vent support. Appears comfortable.   Objective   Blood pressure (!) 161/102, pulse (!) 121, temperature 99.4 F (37.4 C), temperature source Oral, resp. rate (!) 32, height 5\' 6"  (1.676 m), weight 78.4 kg, SpO2 94 %.    Vent Mode: PRVC FiO2 (%):  [40 %-60 %] 40 % Set Rate:  [24 bmp] 24 bmp Vt Set:  [510 mL] 510 mL PEEP:  [5 cmH20] 5 cmH20 Plateau Pressure:  [11 cmH20-16 cmH20] 11 cmH20   Intake/Output Summary (Last 24 hours) at 04/23/2018 0648 Last data filed at 04/23/2018 0600 Gross per 24 hour  Intake 2718.99 ml  Output 1790 ml  Net 928.99 ml   Filed Weights   04/21/18 1600 04/22/18 0500 04/23/18 0407  Weight: 80.7 kg 81.7 kg 78.4 kg    Examination: General: Intubated, sedated HENT: AT, [place], endotracheal tube in place Lungs: On vent, RUL rhonchi, no wheezing Cardiovascular: RRR, no m/r/g Abdomen: distended, soft,  Extremities: well perfused, +pedal pulses Skin: c/d/i   Resolved Hospital Problem list   DKA  Assessment & Plan:   Paul [name] is a 68yo male with COPD on supplemental  O2, CAD s/p stents placed 2013, and HTN presenting after being found confused and dyspneic by family. Profoundly hypoxic on arrival requiring intubation. Found to have RUL PNA and DKA.  Acute Hypoxic Respiratory Failure: Secondary to RUL PNA - Full vent support  - VAP prevention  - Daily WUA / SBT - Propofol, to RASS goal -1  Strep Pneumo Bacteremia:  - Continue Ceftriaxone (Day 2 / 14) -  f/u sensitives  - Trend fever curve  COPD: On home O2 - Continue brovana & pulmicort   Hyperglycemia: Takes metformin at home. DKA on admission, resolved. Hyperglycemic overnight and this morning.  - Restart Levemir 8 QHS - Increase q4h SSI -> resistant   AKI: Non oliguric. ATN vs. Prerenal  - IVFs - Monitor I&Os - Trend BMP  HTN: holding home meds - PRN hydral  CAD s/p Stent Placement 2013 - plavix 75 mg qd  Best practice:  Diet: NPO Pain/Anxiety/Delirium protocol (if indicated): sedated VAP protocol (if indicated): ordered DVT prophylaxis: lovenox GI prophylaxis: Pepcid Glucose control: Levemir, SSI Mobility: bedrest, intubated Code Status: full Family Communication: Daughter Claiborne Billings updated at bedside 1/6 Disposition: ICU   Labs   CBC: Recent Labs  Lab 04/21/18 1257 04/21/18 1942 04/22/18 0422  WBC 10.2 7.0 8.1  NEUTROABS 8.8*  --   --   HGB 14.8 13.2 13.6  HCT 46.9 41.3 42.4  MCV 93.6 93.4 94.2  PLT 243 190 962    Basic Metabolic Panel: Recent Labs  Lab 04/21/18 1257 04/21/18 1942 04/21/18 2311 04/22/18 0422 04/22/18 0759 04/22/18 1214 04/23/18 0444  NA 132*  --  139 144 141 142  --   K 4.5  --  4.3 4.3 3.9 3.7  --   CL 96*  --  108 116* 114* 111  --   CO2 14*  --  19* 17* 19* 20*  --   GLUCOSE 626*  --  258* 121* 198* 161*  --   BUN 40*  --  44* 44* 42* 43*  --   CREATININE 1.76* 1.57* 1.59* 1.68* 1.67* 1.75*  --   CALCIUM 9.1  --  8.3* 8.4* 8.4* 8.1*  --   MG  --   --  2.4 2.5*  --  2.5* 2.5*  PHOS  --   --  3.1 3.6  --  2.8 3.2    GFR: Estimated Creatinine Clearance: 39.2 mL/min (A) (by C-G formula based on SCr of 1.75 mg/dL (H)). Recent Labs  Lab 04/21/18 1218 04/21/18 1257 04/21/18 1942 04/21/18 2311 04/22/18 0422  WBC  --  10.2 7.0  --  8.1  LATICACIDVEN 5.28*  --  2.9* 2.9*  --     Liver Function Tests: Recent Labs  Lab 04/21/18 1257  AST 28  ALT 29  ALKPHOS 70  BILITOT 1.7*  PROT 7.2  ALBUMIN 2.7*   No results for input(s): LIPASE, AMYLASE in the last 168 hours. No results for input(s): AMMONIA in the last 168 hours.  ABG    Component Value Date/Time   PHART 7.398 04/22/2018 0410   PCO2ART 37.6 04/22/2018 0410   PO2ART 197 (H) 04/22/2018 0410   HCO3 22.3 04/22/2018 0410   TCO2 23 04/21/2018 1620   ACIDBASEDEF 1.5 04/22/2018 0410   O2SAT 98.7 04/22/2018 0410     Coagulation Profile: No results for input(s): INR, PROTIME in the last 168 hours.  Cardiac Enzymes: No results for input(s): CKTOTAL, CKMB, CKMBINDEX, TROPONINI in the last 168 hours.  HbA1C: No results found for: HGBA1C  CBG: Recent Labs  Lab 04/22/18 1528 04/22/18 1609 04/22/18 1955 04/22/18 2320 04/23/18 0425  GLUCAP 152* 179* 251* 302* 260*    Past Medical History  He,  has a past medical history of CAD S/P percutaneous coronary angioplasty -- D1, Xience Xpedition DES 2.5 mm x 33 mm (11/24/2011), COPD (chronic obstructive pulmonary disease) ([place]), Essential hypertension (11/24/2011), Exertional dyspnea, chronic, Gout, History of Unstable angina (11/24/2011), Hyperlipidemia with target LDL less than 70 (11/24/2011), Obesity (BMI 30.0-34.9), and OSA (obstructive sleep apnea), uses oxygen at home did not tolerate cpap (11/24/2011).   Surgical History    Past Surgical History:  Procedure Laterality Date  . CERVICAL SPINE SURGERY  2012  . CORONARY ANGIOPLASTY WITH STENT PLACEMENT  11/23/2011   "1; first one"  . LEFT HEART CATHETERIZATION WITH CORONARY ANGIOGRAM N/A 11/23/2011   Procedure: LEFT HEART CATHETERIZATION WITH  CORONARY ANGIOGRAM;  Surgeon: Leonie Man, MD;  Location: [facility] CATH LAB;  Service: Cardiovascular;  Laterality: N/A;     Social History   reports that he has been smoking cigarettes. He has a 40.00 pack-year smoking history. He has never used smokeless tobacco. He reports current alcohol use. He reports that he does not use drugs.   Family History   His family history includes Aneurysm in his father; Emphysema in his father; Heart attack in his brother; Heart disease  in his sister.   Allergies Allergies  Allergen Reactions  . Codeine Nausea And Vomiting  . Duloxetine Hcl Other (See Comments)    Took at night and still felt very lethargic the following day.  (malaise)     Home Medications  Prior to Admission medications   Medication Sig Start Date End Date Taking? Authorizing Provider  albuterol (PROAIR HFA) 108 (90 BASE) MCG/ACT inhaler Inhale 2 puffs into the lungs every 6 (six) hours as needed for wheezing or shortness of breath.    [provider]  albuterol (PROVENTIL) (2.5 MG/3ML) 0.083% nebulizer solution Take 2.5 mg by nebulization every 6 (six) hours as needed for wheezing or shortness of breath.    [provider]  allopurinol (ZYLOPRIM) 300 MG tablet Take 300 mg by mouth daily.    [provider]  amLODipine (NORVASC) 5 MG tablet Take 5 mg by mouth daily.    [provider]  aspirin 325 MG tablet Take 325 mg by mouth daily.    [provider]  atorvastatin (LIPITOR) 20 MG tablet Take 1 tablet (20 mg total) by mouth daily at 6 PM. 08/17/14 11/12/16  Leonie Man, MD  budesonide-formoterol [facility]) 160-4.5 MCG/ACT inhaler Take 2 puffs first thing in am and then another 2 puffs about 12 hours later. 04/13/14   Tanda Rockers, MD  clopidogrel (PLAVIX) 75 MG tablet Take 1 tablet (75 mg total) by mouth daily. 08/17/14   Leonie Man, MD  colchicine (COLCRYS) 0.6 MG tablet Take two tabs by mouth at once then a third tablet one hour  later. Patient taking differently: Take 0.6 mg by mouth daily. Take two tabs by mouth at once then a third tablet one hour later. 04/22/14   Presson, Audelia Hives, PA  cyclobenzaprine (FLEXERIL) 10 MG tablet Take 10 mg by mouth daily.    [provider]  diazepam (VALIUM) 5 MG tablet Take 1 by mouth 1 hour  pre-procedure with very light food. May bring 2nd tablet to appointment. Patient taking differently: Take 5 mg by mouth See admin instructions. Take one tablet (5 mg) by mouth one hour prior to procedure with very light food. May bring 2nd tablet to appointment. 03/25/18   Magnus Sinning, MD  DULoxetine (CYMBALTA) 30 MG capsule Take 30 mg by mouth daily. 03/12/18   [provider]  gabapentin (NEURONTIN) 300 MG capsule Take 300 mg by mouth at bedtime. 03/12/18   [provider]  meclizine (ANTIVERT) 25 MG tablet Take 25 mg by mouth daily. 02/08/16   [provider]  meloxicam (MOBIC) 15 MG tablet Take 15 mg by mouth daily.    [provider]  metFORMIN (GLUCOPHAGE-XR) 500 MG 24 hr tablet Take 500 mg by mouth daily with supper. 03/12/18   [provider]  methylphenidate (RITALIN) 10 MG tablet Take 10 mg by mouth daily.     [provider]  metoprolol succinate (TOPROL-XL) 25 MG 24 hr tablet Take 50 mg by mouth daily. 03/12/18   [provider]  nitroGLYCERIN (NITROSTAT) 0.4 MG SL tablet Place 1 tablet (0.4 mg total) under the tongue every 5 (five) minutes as needed for chest pain. 11/24/11 04/13/14  Isaiah Serge, NP  nortriptyline (PAMELOR) 25 MG capsule Take 25 mg by mouth at bedtime.    [provider]  omeprazole (PRILOSEC) 20 MG capsule Take 20 mg by mouth daily.    [provider]  oxyCODONE-acetaminophen (PERCOCET/ROXICET) 5-325 MG per tablet Take 1  tablet by mouth every 6 (six) hours as needed (pain).  02/09/14   [provider]  traMADol (ULTRAM) 50 MG tablet Take 1 tablet (50 mg total) by  mouth every 6 (six) hours as needed for moderate pain or severe pain. 04/22/14   Presson, Audelia Hives, PA  zolpidem (AMBIEN) 10 MG tablet Take 10 mg by mouth at bedtime as needed for sleep.     [provider]     Critical care time:      Velna Ochs, M.D. - PGY3 Pager: [phone] 04/23/2018, 6:50 AM

## 2018-04-23 NOTE — Progress Notes (Signed)
68 year old man with COPD on home oxygen admitted 1/5 with right upper lobe community-acquired pneumonia requiring mechanical ventilation. FEV1 81% 2015  He appears improved, tolerating FiO2 40%, continues to have breakthrough episodes of agitation on propofol requiring intermittent sedation with Versed and fentanyl On rounds this morning-he appeared to be weaning well on pressure support 5/5 with good tidal volumes but when propofol was discontinued he suddenly developed hyperventilation with respiratory rate in the 50s and had to be put back on sedation. Decreased breath sounds on right otherwise, S1-S2 normal, soft nontender abdomen.  Chest x-ray 1/7 personally reviewed which shows extensive right upper lobe consolidation and patchy infiltrate right lower lobe, clear lungs on left.  Labs show increasing sugars again to 300 range although anion gap remains normal, decreasing creatinine, no leukocytosis or anemia. Respiratory viral panel positive for RSV  Impression/plan   Acute hypoxic respiratory failure-lower respiratory rate to 20, continue spontaneous breathing trials but will defer extubation given secretions and above episode.  Right upper lobe pneumococcal pneumonia with bacteremia/RSV infection-plan for ceftriaxone for 2 weeks, can discontinue respiratory isolation.  COPD-continue Pulmicort and Brovana nebs, no evidence of bronchospasm  AKI-improving, continue normal saline at 50/hour  DKA-resolved, uncontrolled hyperglycemia-increase Levemir to 10 units and change SSI to resistant scale, may need to add tube feed coverage  The patient is critically ill with multiple organ systems failure and requires high complexity decision making for assessment and support, frequent evaluation and titration of therapies, application of advanced monitoring technologies and extensive interpretation of multiple databases. Critical Care Time devoted to patient care services described in this note  independent of APP/resident  time is 33 minutes.   Leanna Sato Elsworth Soho MD

## 2018-04-23 NOTE — Progress Notes (Signed)
Inpatient Diabetes Program Recommendations  AACE/ADA: New Consensus Statement on Inpatient Glycemic Control (2015)  Target Ranges:  Prepandial:   less than 140 mg/dL      Peak postprandial:   less than 180 mg/dL (1-2 hours)      Critically ill patients:  140 - 180 mg/dL   Lab Results  Component Value Date   GLUCAP 325 (H) 04/23/2018    Review of Glycemic Control Results for Paul [name], Paul [name] (MRN [id number]) as of 04/23/2018 08:13  Ref. Range 04/22/2018 19:55 04/22/2018 23:20 04/23/2018 04:25 04/23/2018 07:49  Glucose-Capillary Latest Ref Range: 70 - 99 mg/dL 251 (H) 302 (H) 260 (H) 325 (H)   Diabetes history: DM2 Outpatient Diabetes medications: Metformin 500 mg qd Current orders for Inpatient glycemic control: Phase 1 ICU orders + Levemir 8 units qd + Novolog resistant q 4 hrs.  Inpatient Diabetes Program Recommendations:   -Add Novolog 5 units q 4 hrs. Tube feed coverage q 4 hrs (hold if tube feed held or stopped)  Thank you, Bethena Roys E. Ia Leeb, RN, MSN, CDE  Diabetes Coordinator Inpatient Glycemic Control Team Team Pager [phone] (8am-5pm) 04/23/2018 8:18 AM

## 2018-04-23 NOTE — Progress Notes (Signed)
Received call from infectious disease.  Per ID, pt does not have to be on droplet precautions for RSV.

## 2018-04-24 ENCOUNTER — Other Ambulatory Visit: Payer: Self-pay

## 2018-04-24 ENCOUNTER — Inpatient Hospital Stay (HOSPITAL_COMMUNITY): Payer: Medicare Other

## 2018-04-24 DIAGNOSIS — G9340 Encephalopathy, unspecified: Secondary | ICD-10-CM

## 2018-04-24 LAB — CULTURE, BLOOD (ROUTINE X 2): Special Requests: ADEQUATE

## 2018-04-24 LAB — GLUCOSE, CAPILLARY
Glucose-Capillary: 175 mg/dL — ABNORMAL HIGH (ref 70–99)
Glucose-Capillary: 223 mg/dL — ABNORMAL HIGH (ref 70–99)
Glucose-Capillary: 232 mg/dL — ABNORMAL HIGH (ref 70–99)
Glucose-Capillary: 275 mg/dL — ABNORMAL HIGH (ref 70–99)
Glucose-Capillary: 282 mg/dL — ABNORMAL HIGH (ref 70–99)
Glucose-Capillary: 296 mg/dL — ABNORMAL HIGH (ref 70–99)

## 2018-04-24 LAB — BASIC METABOLIC PANEL
Anion gap: 9 (ref 5–15)
BUN: 37 mg/dL — ABNORMAL HIGH (ref 8–23)
CO2: 20 mmol/L — ABNORMAL LOW (ref 22–32)
Calcium: 8.4 mg/dL — ABNORMAL LOW (ref 8.9–10.3)
Chloride: 116 mmol/L — ABNORMAL HIGH (ref 98–111)
Creatinine, Ser: 1.06 mg/dL (ref 0.61–1.24)
GFR calc Af Amer: >60 mL/min (ref >60)
GFR calc non Af Amer: >60 mL/min (ref >60)
Glucose, Bld: 313 mg/dL — ABNORMAL HIGH (ref 70–99)
Potassium: 4.3 mmol/L (ref 3.5–5.1)
Sodium: 145 mmol/L (ref 135–145)

## 2018-04-24 LAB — PHOSPHORUS: Phosphorus: 2.9 mg/dL (ref 2.5–4.6)

## 2018-04-24 LAB — TRIGLYCERIDES: Triglycerides: 216 mg/dL — ABNORMAL HIGH (ref <150)

## 2018-04-24 LAB — CBC
HCT: 40.2 % (ref 39.0–52.0)
Hemoglobin: 12.5 g/dL — ABNORMAL LOW (ref 13.0–17.0)
MCH: 29.5 pg (ref 26.0–34.0)
MCHC: 31.1 g/dL (ref 30.0–36.0)
MCV: 94.8 fL (ref 80.0–100.0)
Platelets: 188 10*3/uL (ref 150–400)
RBC: 4.24 MIL/uL (ref 4.22–5.81)
RDW: 13.9 % (ref 11.5–15.5)
WBC: 10.6 10*3/uL — ABNORMAL HIGH (ref 4.0–10.5)
nRBC: 0.2 % (ref 0.0–0.2)

## 2018-04-24 LAB — MAGNESIUM: Magnesium: 2 mg/dL (ref 1.7–2.4)

## 2018-04-24 MED ORDER — HYDRALAZINE HCL 20 MG/ML IJ SOLN
5.0000 mg | INTRAMUSCULAR | Status: DC | PRN
  Administered 2018-04-30 – 2018-05-02 (×2): 5 mg via INTRAVENOUS
  Filled 2018-04-24 (×3): qty 1

## 2018-04-24 MED ORDER — FENTANYL 2500MCG IN NS 250ML (10MCG/ML) PREMIX INFUSION
0.0000 ug/h | INTRAVENOUS | Status: DC
  Administered 2018-04-24: 25 ug/h via INTRAVENOUS
  Administered 2018-04-25: 200 ug/h via INTRAVENOUS
  Administered 2018-04-25 – 2018-04-26 (×2): 250 ug/h via INTRAVENOUS
  Administered 2018-04-26: 100 ug/h via INTRAVENOUS
  Filled 2018-04-24 (×6): qty 250

## 2018-04-24 MED ORDER — DOCUSATE SODIUM 50 MG/5ML PO LIQD
100.0000 mg | Freq: Two times a day (BID) | ORAL | Status: DC
  Administered 2018-04-24 (×2): 100 mg
  Filled 2018-04-24 (×2): qty 10

## 2018-04-24 MED ORDER — ALBUTEROL SULFATE (2.5 MG/3ML) 0.083% IN NEBU
2.5000 mg | INHALATION_SOLUTION | RESPIRATORY_TRACT | Status: DC | PRN
  Administered 2018-04-28 – 2018-05-02 (×5): 2.5 mg via RESPIRATORY_TRACT
  Filled 2018-04-24 (×5): qty 3

## 2018-04-24 MED ORDER — INSULIN ASPART 100 UNIT/ML ~~LOC~~ SOLN
10.0000 [IU] | SUBCUTANEOUS | Status: DC
  Administered 2018-04-24 – 2018-04-25 (×6): 10 [IU] via SUBCUTANEOUS

## 2018-04-24 MED ORDER — DEXMEDETOMIDINE HCL IN NACL 400 MCG/100ML IV SOLN
0.4000 ug/kg/h | INTRAVENOUS | Status: DC
  Administered 2018-04-24: 1.2 ug/kg/h via INTRAVENOUS
  Administered 2018-04-24: 0.4 ug/kg/h via INTRAVENOUS
  Administered 2018-04-24 – 2018-04-26 (×13): 1.2 ug/kg/h via INTRAVENOUS
  Administered 2018-04-27: 1 ug/kg/h via INTRAVENOUS
  Administered 2018-04-27: 1.2 ug/kg/h via INTRAVENOUS
  Administered 2018-04-27: 0.8 ug/kg/h via INTRAVENOUS
  Administered 2018-04-27: 1 ug/kg/h via INTRAVENOUS
  Administered 2018-04-27: 1.2 ug/kg/h via INTRAVENOUS
  Administered 2018-04-28: 1.1 ug/kg/h via INTRAVENOUS
  Administered 2018-04-28: 1 ug/kg/h via INTRAVENOUS
  Administered 2018-04-28: 0.6 ug/kg/h via INTRAVENOUS
  Administered 2018-04-28: 1 ug/kg/h via INTRAVENOUS
  Administered 2018-04-29 (×2): 1.2 ug/kg/h via INTRAVENOUS
  Administered 2018-04-29: 1 ug/kg/h via INTRAVENOUS
  Filled 2018-04-24 (×8): qty 100
  Filled 2018-04-24: qty 200
  Filled 2018-04-24 (×3): qty 100
  Filled 2018-04-24: qty 200
  Filled 2018-04-24 (×4): qty 100
  Filled 2018-04-24 (×2): qty 200
  Filled 2018-04-24 (×4): qty 100

## 2018-04-24 MED ORDER — INSULIN DETEMIR 100 UNIT/ML ~~LOC~~ SOLN
10.0000 [IU] | Freq: Two times a day (BID) | SUBCUTANEOUS | Status: DC
  Administered 2018-04-24 (×2): 10 [IU] via SUBCUTANEOUS
  Filled 2018-04-24 (×3): qty 0.1

## 2018-04-24 MED ORDER — CEFAZOLIN SODIUM-DEXTROSE 2-4 GM/100ML-% IV SOLN
2.0000 g | Freq: Three times a day (TID) | INTRAVENOUS | Status: DC
  Administered 2018-04-24 – 2018-04-27 (×9): 2 g via INTRAVENOUS
  Filled 2018-04-24 (×11): qty 100

## 2018-04-24 MED ORDER — ALBUTEROL SULFATE (2.5 MG/3ML) 0.083% IN NEBU
INHALATION_SOLUTION | RESPIRATORY_TRACT | Status: AC
  Administered 2018-04-24: 2.5 mg
  Filled 2018-04-24: qty 3

## 2018-04-24 NOTE — Progress Notes (Signed)
RT note: Pt placed on wean, difficult time with sedation today. Wean is tolerated.

## 2018-04-24 NOTE — Progress Notes (Addendum)
NAME:  Paul [name], MRN:  [id number], DOB:  02/27/1951, LOS: 3 ADMISSION DATE:  04/21/2018, CONSULTATION DATE:  1/5 REFERRING MD:  EDP, CHIEF COMPLAINT:  AMS  Brief History   68yo male with COPD on supplemental O2, CAD s/p stents 2013 presenting after being found confused and dyspneic by family. Profoundly hypoxic in ED. Intubated and found to have RUL pneumonia, strep pneumo bacteremia, and DKA.   Past Medical History  HTN CAD COPD on supplemental O2 OSA on CPAP Prediabetic   Significant Hospital Events   1/5 > Intubated, admit   Consults:    Procedures:  1/5 ETT >   Significant Diagnostic Tests:  1/5 > CXR with RUL pneumonia   Micro Data:  1/5 Blood culture >> Strep Pneumo 1/5 Urine culture >> pending 1/5 resp culture >> MSSA 1/6 RVP >> + RSV  Antimicrobials:  1/5 vanc >> 1/5 1/5 azithro >> DC'd 1/6 1/5 ceftriaxone >> DC'd 1/8 1/8 Ancef >>  Interim history/subjective:  Persistently febrile yesterday to Tmax 102.8. Sedated on full vent support. Appears comfortable.   Objective   Blood pressure (!) 159/87, pulse (!) 103, temperature 99.8 F (37.7 C), temperature source Oral, resp. rate (!) 23, height 5\' 6"  (1.676 m), weight 80 kg, SpO2 95 %.    Vent Mode: PRVC FiO2 (%):  [40 %] 40 % Set Rate:  [24 bmp] 24 bmp Vt Set:  [510 mL] 510 mL PEEP:  [5 cmH20] 5 cmH20 Pressure Support:  [5 HYQ65-78 cmH20] 12 cmH20 Plateau Pressure:  [11 cmH20] 11 cmH20   Intake/Output Summary (Last 24 hours) at 04/24/2018 0650 Last data filed at 04/24/2018 0600 Gross per 24 hour  Intake 3512.11 ml  Output 2135 ml  Net 1377.11 ml   Filed Weights   04/22/18 0500 04/23/18 0407 04/24/18 0330  Weight: 81.7 kg 78.4 kg 80 kg    Examination: General: Intubated, sedated HENT: AT, [place], endotracheal tube in place Lungs: On vent, Diffuse rhonchi, no wheezing Cardiovascular: RRR, no m/r/g Abdomen: distended, soft,  Extremities: well perfused, +pedal pulses Skin: c/d/i   Resolved Hospital  Problem list   DKA  Assessment & Plan:   Mr. Deruiter is a 68yo male with COPD on supplemental O2, CAD s/p stents placed 2013, and HTN presenting after being found confused and dyspneic by family. Profoundly hypoxic on arrival requiring intubation. Found to have RUL PNA and DKA.  Acute Hypoxic Respiratory Failure: Secondary to RUL Strep Pneumonia, RSV, and MSSA - Full vent support, hopefully extubation today  - VAP prevention  - Daily WUA / SBT - Change propofol to precedex  Strep Pneumo Bacteremia:  - Narrow Ceftriaxone >> Ancef (Day 4 / 14) - Trend fever curve  COPD: On home O2 - Continue brovana & pulmicort   Hyperglycemia: Takes metformin at home. DKA on admission, resolved. Remains hyperglycemic despite increase in regimen yesterday. - Increase Levemir 10 QHS -> 10 BID  - Increase novolog 10 units q4h - Continue resistant SSI q4h  HTN: - Restarted home lopressor 25 BID - IV PRN Hydralazine  - Holding home amlodipine  AKI: Resolved  CAD s/p Stent Placement 2013 - plavix 75 mg qd  Best practice:  Diet: NPO Pain/Anxiety/Delirium protocol (if indicated): sedated VAP protocol (if indicated): ordered DVT prophylaxis: lovenox GI prophylaxis: Pepcid Glucose control: Levemir, SSI Mobility: bedrest, intubated Code Status: full Family Communication: Son in law updated at bedside 1/7 Disposition: ICU   Labs   CBC: Recent Labs  Lab 04/21/18 1257 04/21/18 1942 04/22/18  0422 04/23/18 0900  WBC 10.2 7.0 8.1 10.2  NEUTROABS 8.8*  --   --   --   HGB 14.8 13.2 13.6 12.0*  HCT 46.9 41.3 42.4 38.4*  MCV 93.6 93.4 94.2 95.0  PLT 243 190 180 937    Basic Metabolic Panel: Recent Labs  Lab 04/21/18 2311 04/22/18 0422 04/22/18 0759 04/22/18 1214 04/23/18 0444 04/23/18 0900 04/23/18 1853  NA 139 144 141 142  --  141  --   K 4.3 4.3 3.9 3.7  --  3.9  --   CL 108 116* 114* 111  --  111  --   CO2 19* 17* 19* 20*  --  21*  --   GLUCOSE 258* 121* 198* 161*  --   342*  --   BUN 44* 44* 42* 43*  --  47*  --   CREATININE 1.59* 1.68* 1.67* 1.75*  --  1.53*  --   CALCIUM 8.3* 8.4* 8.4* 8.1*  --  8.1*  --   MG 2.4 2.5*  --  2.5* 2.5*  --  2.1  PHOS 3.1 3.6  --  2.8 3.2  --  3.0   GFR: Estimated Creatinine Clearance: 45.3 mL/min (A) (by C-G formula based on SCr of 1.53 mg/dL (H)). Recent Labs  Lab 04/21/18 1218 04/21/18 1257 04/21/18 1942 04/21/18 2311 04/22/18 0422 04/23/18 0900  WBC  --  10.2 7.0  --  8.1 10.2  LATICACIDVEN 5.28*  --  2.9* 2.9*  --   --     Liver Function Tests: Recent Labs  Lab 04/21/18 1257  AST 28  ALT 29  ALKPHOS 70  BILITOT 1.7*  PROT 7.2  ALBUMIN 2.7*   No results for input(s): LIPASE, AMYLASE in the last 168 hours. No results for input(s): AMMONIA in the last 168 hours.  ABG    Component Value Date/Time   PHART 7.398 04/22/2018 0410   PCO2ART 37.6 04/22/2018 0410   PO2ART 197 (H) 04/22/2018 0410   HCO3 22.3 04/22/2018 0410   TCO2 23 04/21/2018 1620   ACIDBASEDEF 1.5 04/22/2018 0410   O2SAT 98.7 04/22/2018 0410     Coagulation Profile: No results for input(s): INR, PROTIME in the last 168 hours.  Cardiac Enzymes: No results for input(s): CKTOTAL, CKMB, CKMBINDEX, TROPONINI in the last 168 hours.  HbA1C: No results found for: HGBA1C  CBG: Recent Labs  Lab 04/23/18 1151 04/23/18 1628 04/23/18 1947 04/24/18 0043 04/24/18 0441  GLUCAP 287* 270* 268* 282* 296*    Past Medical History  He,  has a past medical history of CAD S/P percutaneous coronary angioplasty -- D1, Xience Xpedition DES 2.5 mm x 33 mm (11/24/2011), COPD (chronic obstructive pulmonary disease) ([place]), Essential hypertension (11/24/2011), Exertional dyspnea, chronic, Gout, History of Unstable angina (11/24/2011), Hyperlipidemia with target LDL less than 70 (11/24/2011), Obesity (BMI 30.0-34.9), and OSA (obstructive sleep apnea), uses oxygen at home did not tolerate cpap (11/24/2011).   Surgical History    Past Surgical History:    Procedure Laterality Date  . CERVICAL SPINE SURGERY  2012  . CORONARY ANGIOPLASTY WITH STENT PLACEMENT  11/23/2011   "1; first one"  . LEFT HEART CATHETERIZATION WITH CORONARY ANGIOGRAM N/A 11/23/2011   Procedure: LEFT HEART CATHETERIZATION WITH CORONARY ANGIOGRAM;  Surgeon: Leonie Man, MD;  Location: [facility] CATH LAB;  Service: Cardiovascular;  Laterality: N/A;     Social History   reports that he has been smoking cigarettes. He has a 40.00 pack-year smoking history. He has never  used smokeless tobacco. He reports current alcohol use. He reports that he does not use drugs.   Family History   His family history includes Aneurysm in his father; Emphysema in his father; Heart attack in his brother; Heart disease in his sister.   Allergies Allergies  Allergen Reactions  . Codeine Nausea And Vomiting  . Duloxetine Hcl Other (See Comments)    Took at night and still felt very lethargic the following day.  (malaise)     Home Medications  Prior to Admission medications   Medication Sig Start Date End Date Taking? Authorizing Provider  albuterol (PROAIR HFA) 108 (90 BASE) MCG/ACT inhaler Inhale 2 puffs into the lungs every 6 (six) hours as needed for wheezing or shortness of breath.    [provider]  albuterol (PROVENTIL) (2.5 MG/3ML) 0.083% nebulizer solution Take 2.5 mg by nebulization every 6 (six) hours as needed for wheezing or shortness of breath.    [provider]  allopurinol (ZYLOPRIM) 300 MG tablet Take 300 mg by mouth daily.    [provider]  amLODipine (NORVASC) 5 MG tablet Take 5 mg by mouth daily.    [provider]  aspirin 325 MG tablet Take 325 mg by mouth daily.    [provider]  atorvastatin (LIPITOR) 20 MG tablet Take 1 tablet (20 mg total) by mouth daily at 6 PM. 08/17/14 11/12/16  Leonie Man, MD  budesonide-formoterol [facility]) 160-4.5 MCG/ACT inhaler Take 2 puffs first thing in am and then another 2 puffs about 12  hours later. 04/13/14   Tanda Rockers, MD  clopidogrel (PLAVIX) 75 MG tablet Take 1 tablet (75 mg total) by mouth daily. 08/17/14   Leonie Man, MD  colchicine (COLCRYS) 0.6 MG tablet Take two tabs by mouth at once then a third tablet one hour later. Patient taking differently: Take 0.6 mg by mouth daily. Take two tabs by mouth at once then a third tablet one hour later. 04/22/14   Presson, Audelia Hives, PA  cyclobenzaprine (FLEXERIL) 10 MG tablet Take 10 mg by mouth daily.    [provider]  diazepam (VALIUM) 5 MG tablet Take 1 by mouth 1 hour  pre-procedure with very light food. May bring 2nd tablet to appointment. Patient taking differently: Take 5 mg by mouth See admin instructions. Take one tablet (5 mg) by mouth one hour prior to procedure with very light food. May bring 2nd tablet to appointment. 03/25/18   Magnus Sinning, MD  DULoxetine (CYMBALTA) 30 MG capsule Take 30 mg by mouth daily. 03/12/18   [provider]  gabapentin (NEURONTIN) 300 MG capsule Take 300 mg by mouth at bedtime. 03/12/18   [provider]  meclizine (ANTIVERT) 25 MG tablet Take 25 mg by mouth daily. 02/08/16   [provider]  meloxicam (MOBIC) 15 MG tablet Take 15 mg by mouth daily.    [provider]  metFORMIN (GLUCOPHAGE-XR) 500 MG 24 hr tablet Take 500 mg by mouth daily with supper. 03/12/18   [provider]  methylphenidate (RITALIN) 10 MG tablet Take 10 mg by mouth daily.     [provider]  metoprolol succinate (TOPROL-XL) 25 MG 24 hr tablet Take 50 mg by mouth daily. 03/12/18   [provider]  nitroGLYCERIN (NITROSTAT) 0.4 MG SL tablet Place 1 tablet (0.4 mg total) under the tongue every 5 (five) minutes as needed for chest pain. 11/24/11 04/13/14  Isaiah Serge, NP  nortriptyline (PAMELOR) 25 MG  capsule Take 25 mg by mouth at bedtime.    [provider]  omeprazole (PRILOSEC) 20 MG capsule Take 20 mg by mouth daily.     [provider]  oxyCODONE-acetaminophen (PERCOCET/ROXICET) 5-325 MG per tablet Take 1 tablet by mouth every 6 (six) hours as needed (pain).  02/09/14   [provider]  traMADol (ULTRAM) 50 MG tablet Take 1 tablet (50 mg total) by mouth every 6 (six) hours as needed for moderate pain or severe pain. 04/22/14   Presson, Audelia Hives, PA  zolpidem (AMBIEN) 10 MG tablet Take 10 mg by mouth at bedtime as needed for sleep.     [provider]     Critical care time:      Velna Ochs, M.D. - PGY3 Pager: [phone] 04/24/2018, 6:50 AM

## 2018-04-24 NOTE — Care Management Note (Signed)
Case Management Note Manya Silvas, RN MSN CCM Transitions of Care 22M [place] [phone]  Patient Details  Name: Paul [name] MRN: [id number] Date of Birth: Oct 11, 1950  Subjective/Objective:                 CAP, COPD,    Action/Plan: PTA home with family. Home O2. Currently intubated. Will continue to follow for transition of care needs.   Expected Discharge Date:                  Expected Discharge Plan:  Long  In-House Referral:     Discharge planning Services  CM Consult  Post Acute Care Choice:    Choice offered to:     DME Arranged:    DME Agency:     HH Arranged:    [place] Agency:     Status of Service:  In process, will continue to follow  If discussed at Long Length of Stay Meetings, dates discussed:    Additional Comments:  Bartholomew Crews, RN 04/24/2018, 12:30 PM

## 2018-04-24 NOTE — Progress Notes (Signed)
68 year old man with COPD on home oxygen admitted 1/5 with right upper lobe community-acquired pneumonia requiring mechanical ventilation. FEV1 81% 2015  He seems to tolerate weaning on high level pressure support on low-dose propofol but if propofol is completely turned off he seems to have severe agitation with hyperventilation.  On exam-sedated on propofol, RA SS -2, pupils 3 mm bilateral equal reactive light, no pallor, icterus, no JVD, no edema, S1-S2 tacky, decreased breath sounds on right, clear on left, faint expiratory rhonchi.  Chest x-ray personally reviewed which shows extensive right upper lobe consolidation patchy right lower lobe infiltrate, clear lungs and left.  Labs show no leukocytosis, normal electrolytes, improved creatinine to 1.06.  Impression/plan  Acute hypoxic respiratory failure-low respiratory rate to 18, continue spontaneous breathing trials but will need agitation to be better controlled before extubation, also secretions need to decrease.  Right upper lobe pneumococcal pneumonia with bacteremia/RSV infection-plan for ceftriaxone for 2 weeks.  AKI -much improved, discontinue fluids.  COPD -continue Pulmicort and Brovana nebs, no evidence of bronchospasm  Acute encephalopathy-discontinue propofol and changed to Precedex, continue to use intermittent fentanyl.  DKA-resolved, increase Levemir to 10 units twice daily for uncontrolled hyperglycemia and SSI resistant scale  Daughter updated at bedside.  The patient is critically ill with multiple organ systems failure and requires high complexity decision making for assessment and support, frequent evaluation and titration of therapies, application of advanced monitoring technologies and extensive interpretation of multiple databases. Critical Care Time devoted to patient care services described in this note independent of APP/resident  time is 33 minutes.   Leanna Sato Elsworth Soho MD

## 2018-04-25 ENCOUNTER — Inpatient Hospital Stay (HOSPITAL_COMMUNITY): Payer: Medicare Other

## 2018-04-25 DIAGNOSIS — J13 Pneumonia due to Streptococcus pneumoniae: Secondary | ICD-10-CM

## 2018-04-25 LAB — BASIC METABOLIC PANEL
Anion gap: 9 (ref 5–15)
BUN: 37 mg/dL — ABNORMAL HIGH (ref 8–23)
CO2: 21 mmol/L — ABNORMAL LOW (ref 22–32)
Calcium: 8 mg/dL — ABNORMAL LOW (ref 8.9–10.3)
Chloride: 117 mmol/L — ABNORMAL HIGH (ref 98–111)
Creatinine, Ser: 1.07 mg/dL (ref 0.61–1.24)
GFR calc Af Amer: >60 mL/min (ref >60)
GFR calc non Af Amer: >60 mL/min (ref >60)
Glucose, Bld: 253 mg/dL — ABNORMAL HIGH (ref 70–99)
Potassium: 4.4 mmol/L (ref 3.5–5.1)
Sodium: 147 mmol/L — ABNORMAL HIGH (ref 135–145)

## 2018-04-25 LAB — CBC
HCT: 38.8 % — ABNORMAL LOW (ref 39.0–52.0)
Hemoglobin: 11.6 g/dL — ABNORMAL LOW (ref 13.0–17.0)
MCH: 29.5 pg (ref 26.0–34.0)
MCHC: 29.9 g/dL — ABNORMAL LOW (ref 30.0–36.0)
MCV: 98.7 fL (ref 80.0–100.0)
Platelets: 171 10*3/uL (ref 150–400)
RBC: 3.93 MIL/uL — ABNORMAL LOW (ref 4.22–5.81)
RDW: 13.8 % (ref 11.5–15.5)
WBC: 8.8 10*3/uL (ref 4.0–10.5)
nRBC: 0.2 % (ref 0.0–0.2)

## 2018-04-25 LAB — GLUCOSE, CAPILLARY
Glucose-Capillary: 121 mg/dL — ABNORMAL HIGH (ref 70–99)
Glucose-Capillary: 191 mg/dL — ABNORMAL HIGH (ref 70–99)
Glucose-Capillary: 197 mg/dL — ABNORMAL HIGH (ref 70–99)
Glucose-Capillary: 197 mg/dL — ABNORMAL HIGH (ref 70–99)
Glucose-Capillary: 238 mg/dL — ABNORMAL HIGH (ref 70–99)
Glucose-Capillary: 267 mg/dL — ABNORMAL HIGH (ref 70–99)
Glucose-Capillary: 63 mg/dL — ABNORMAL LOW (ref 70–99)

## 2018-04-25 LAB — CULTURE, RESPIRATORY

## 2018-04-25 LAB — CULTURE, RESPIRATORY W GRAM STAIN

## 2018-04-25 LAB — MAGNESIUM: Magnesium: 1.7 mg/dL (ref 1.7–2.4)

## 2018-04-25 LAB — LACTIC ACID, PLASMA: Lactic Acid, Venous: 1.6 mmol/L (ref 0.5–1.9)

## 2018-04-25 LAB — PHOSPHORUS: Phosphorus: 3.1 mg/dL (ref 2.5–4.6)

## 2018-04-25 MED ORDER — SENNOSIDES-DOCUSATE SODIUM 8.6-50 MG PO TABS
1.0000 | ORAL_TABLET | Freq: Two times a day (BID) | ORAL | Status: DC
  Administered 2018-04-25 – 2018-05-03 (×11): 1
  Filled 2018-04-25 (×11): qty 1

## 2018-04-25 MED ORDER — BISACODYL 10 MG RE SUPP: 10.0000 mg | Freq: Every day | RECTAL | Status: DC | PRN

## 2018-04-25 MED ORDER — DEXTROSE 50 % IV SOLN
INTRAVENOUS | Status: AC
  Filled 2018-04-25: qty 50

## 2018-04-25 MED ORDER — INSULIN DETEMIR 100 UNIT/ML ~~LOC~~ SOLN
20.0000 [IU] | Freq: Two times a day (BID) | SUBCUTANEOUS | Status: DC
  Administered 2018-04-25: 20 [IU] via SUBCUTANEOUS
  Filled 2018-04-25 (×2): qty 0.2

## 2018-04-25 MED ORDER — FREE WATER
200.0000 mL | Freq: Three times a day (TID) | Status: DC
  Administered 2018-04-25 – 2018-04-27 (×7): 200 mL

## 2018-04-25 MED ORDER — FENTANYL CITRATE (PF) 100 MCG/2ML IJ SOLN
50.0000 ug | INTRAMUSCULAR | Status: DC | PRN
  Administered 2018-04-25 – 2018-04-27 (×7): 50 ug via INTRAVENOUS

## 2018-04-25 MED ORDER — INSULIN ASPART 100 UNIT/ML ~~LOC~~ SOLN
12.0000 [IU] | SUBCUTANEOUS | Status: DC
  Administered 2018-04-25 – 2018-04-28 (×12): 12 [IU] via SUBCUTANEOUS

## 2018-04-25 MED ORDER — FENTANYL CITRATE (PF) 100 MCG/2ML IJ SOLN
50.0000 ug | INTRAMUSCULAR | Status: DC | PRN
  Administered 2018-04-25 – 2018-04-27 (×2): 50 ug via INTRAVENOUS

## 2018-04-25 MED ORDER — INSULIN DETEMIR 100 UNIT/ML ~~LOC~~ SOLN
20.0000 [IU] | Freq: Every day | SUBCUTANEOUS | Status: DC
  Administered 2018-04-26 – 2018-04-27 (×2): 20 [IU] via SUBCUTANEOUS
  Filled 2018-04-25 (×3): qty 0.2

## 2018-04-25 MED ORDER — SODIUM CHLORIDE 0.9 % IV BOLUS
500.0000 mL | Freq: Once | INTRAVENOUS | Status: AC
  Administered 2018-04-25: 500 mL via INTRAVENOUS

## 2018-04-25 MED ORDER — LORAZEPAM 2 MG/ML IJ SOLN
0.5000 mg | Freq: Three times a day (TID) | INTRAMUSCULAR | Status: DC
  Administered 2018-04-25 – 2018-04-26 (×3): 0.5 mg via INTRAVENOUS
  Filled 2018-04-25 (×3): qty 1

## 2018-04-25 MED ORDER — DEXTROSE 50 % IV SOLN
25.0000 mL | Freq: Once | INTRAVENOUS | Status: AC
  Administered 2018-04-25: 25 mL via INTRAVENOUS

## 2018-04-25 NOTE — Progress Notes (Addendum)
Pts afternoon CBG was 63. 45ml of D50 given, recheck 15 min later: 121. Spoke w/ Dr. Elsworth Soho. Orders to d/c nighttime levemir

## 2018-04-25 NOTE — Progress Notes (Signed)
68 year old man with COPD on home oxygen admitted 1/5 with right upper lobe community-acquired pneumonia requiring mechanical ventilation. FEV1 81% 2015  He is febrile He continues to be severely agitated when sedation is lowered. Now on Precedex and fentanyl drip. On exam-RA SS -2, no pallor, icterus, no JVD, no edema, decreased breath sounds on right, clear on left, faint scattered rhonchi, S1-S2 normal.  Chest x-ray personally reviewed which shows improved aeration within right upper lobe consolidation, clear lungs on left.  Labs show mild hypernatremia, improving creatinine, no leukocytosis  Impression/plan Acute hypoxic respiratory failure-continue spontaneous breathing trials, he seems to tolerate pressure support 10/5 but agitation needs to be better controlled as well as secretions before extubation.  Right upper lobe pneumococcal pneumonia with bacteremia/RSV infection/MSSA pneumonia-changed to Ancef , but wonder if that is the cause of persistent fever.  No evidence of pleural effusion.  No leukocytosis which is reassuring.  Acute encephalopathy-continue Precedex and fentanyl drip, add low-dose Ativan for anxiety, he was on Ambien daily.  Diabetes type 2-DKA resolved, continue Levemir 20 twice daily and SSI resistant scale  My independent critical care time x 33m  Karess Harner V. Elsworth Soho MD

## 2018-04-25 NOTE — Progress Notes (Addendum)
NAME:  Paul [name], MRN:  [id number], DOB:  1950-10-23, LOS: 4 ADMISSION DATE:  04/21/2018, CONSULTATION DATE:  1/5 REFERRING MD:  EDP, CHIEF COMPLAINT:  AMS  Brief History   68yo male with COPD on supplemental O2, CAD s/p stents 2013 presenting after being found confused and dyspneic by family. Profoundly hypoxic in ED. Intubated and found to have RUL pneumonia, strep pneumo bacteremia, and DKA.   Past Medical History  HTN CAD COPD on supplemental O2 OSA on CPAP Prediabetic   Significant Hospital Events   1/5 > Intubated, admit   Consults:    Procedures:  1/5 ETT >   Significant Diagnostic Tests:  1/5 > CXR with RUL pneumonia   Micro Data:  1/5 Blood culture >> Strep Pneumo 1/5 Urine culture >> pending 1/5 resp culture >> MSSA 1/6 RVP >> + RSV  Antimicrobials:  1/5 vanc >> 1/5 1/5 azithro >> DC'd 1/6 1/5 ceftriaxone >> DC'd 1/8 1/8 Ancef >>  Interim history/subjective:  Persistent fevers yesterday despite 4 days of antibiotics. Hypotensive this morning.   Objective   Blood pressure 106/60, pulse 64, temperature 99.5 F (37.5 C), temperature source Axillary, resp. rate 17, height 5\' 6"  (1.676 m), weight 80.7 kg, SpO2 95 %.    Vent Mode: PRVC FiO2 (%):  [40 %] 40 % Set Rate:  [18 bmp-24 bmp] 18 bmp Vt Set:  [510 mL] 510 mL PEEP:  [5 cmH20] 5 cmH20 Pressure Support:  [10 cmH20] 10 cmH20 Plateau Pressure:  [14 cmH20-17 cmH20] 16 cmH20   Intake/Output Summary (Last 24 hours) at 04/25/2018 0722 Last data filed at 04/25/2018 1194 Gross per 24 hour  Intake 3017.72 ml  Output 1330 ml  Net 1687.72 ml   Filed Weights   04/23/18 0407 04/24/18 0330 04/25/18 0500  Weight: 78.4 kg 80 kg 80.7 kg    Examination: General: Intubated, sedated HENT: AT, [place], endotracheal tube in place Lungs: On vent, Diffuse rhonchi, no wheezing Cardiovascular: RRR, no m/r/g Abdomen: distended, soft,  Extremities: +pitting edema Skin: c/d/i   Resolved Hospital Problem list    DKA  Assessment & Plan:   Paul [name] is a 68yo male with COPD on supplemental O2, CAD s/p stents placed 2013, and HTN presenting after being found confused and dyspneic by family. Profoundly hypoxic on arrival requiring intubation. Found to have RUL PNA and DKA.  Acute Hypoxic Respiratory Failure: Secondary to RUL PNA. Cultures with Strep Pneumonia, RSV, and MSSA. Continues to have fevers. ? Developing empyema. Repeat CXR appears stable. Consider CT chest pending repeat cultures.  - Repeat sputum cultures  - Full vent support - VAP prevention  - Daily WUA / SBT - Change propofol to precedex  Sepsis / Strep Pneumo Bacteremia: Hypotensive this morning, persistent fevers.  - 500 NS bolus - Checking lactic acid - [place] Ceftriaxone >> Ancef (Day 5 / 14) - Trend fever curve - Repeat blood cultures  Mild Hypernatremia: - added free water 200 q8 per tube  Constipation: No BM with colace, abdomen soft  - Change to BID Senokot - Miralax tonight if no BM   COPD: On home O2 - Continue brovana & pulmicort   Hyperglycemia: Takes metformin at home. DKA on admission, resolved. Remains hyperglycemic despite increase in regimen yesterday. - Increase Levemir 10 BID -> 20 BID - Increase novolog 10 -> 12 units q4h - Continue resistant SSI q4h  HTN: - Hold home lopressor 25 BID - IV PRN Hydralazine  - Holding home amlodipine  AKI: Resolved  CAD s/p Stent Placement 2013 - plavix 75 mg qd  Best practice:  Diet: NPO Pain/Anxiety/Delirium protocol (if indicated): sedated VAP protocol (if indicated): ordered DVT prophylaxis: lovenox GI prophylaxis: Pepcid Glucose control: Levemir, SSI Mobility: bedrest, intubated Code Status: full Family Communication: Son in law updated at bedside 1/7 Disposition: ICU   Labs   CBC: Recent Labs  Lab 04/21/18 1257 04/21/18 1942 04/22/18 0422 04/23/18 0900 04/24/18 0802  WBC 10.2 7.0 8.1 10.2 10.6*  NEUTROABS 8.8*  --   --   --   --    HGB 14.8 13.2 13.6 12.0* 12.5*  HCT 46.9 41.3 42.4 38.4* 40.2  MCV 93.6 93.4 94.2 95.0 94.8  PLT 243 190 180 172 423    Basic Metabolic Panel: Recent Labs  Lab 04/22/18 0422 04/22/18 0759 04/22/18 1214 04/23/18 0444 04/23/18 0900 04/23/18 1853 04/24/18 0634  NA 144 141 142  --  141  --  145  K 4.3 3.9 3.7  --  3.9  --  4.3  CL 116* 114* 111  --  111  --  116*  CO2 17* 19* 20*  --  21*  --  20*  GLUCOSE 121* 198* 161*  --  342*  --  313*  BUN 44* 42* 43*  --  47*  --  37*  CREATININE 1.68* 1.67* 1.75*  --  1.53*  --  1.06  CALCIUM 8.4* 8.4* 8.1*  --  8.1*  --  8.4*  MG 2.5*  --  2.5* 2.5*  --  2.1 2.0  PHOS 3.6  --  2.8 3.2  --  3.0 2.9   GFR: Estimated Creatinine Clearance: 65.7 mL/min (by C-G formula based on SCr of 1.06 mg/dL). Recent Labs  Lab 04/21/18 1218  04/21/18 1942 04/21/18 2311 04/22/18 0422 04/23/18 0900 04/24/18 0802  WBC  --    < > 7.0  --  8.1 10.2 10.6*  LATICACIDVEN 5.28*  --  2.9* 2.9*  --   --   --    < > = values in this interval not displayed.    Liver Function Tests: Recent Labs  Lab 04/21/18 1257  AST 28  ALT 29  ALKPHOS 70  BILITOT 1.7*  PROT 7.2  ALBUMIN 2.7*   No results for input(s): LIPASE, AMYLASE in the last 168 hours. No results for input(s): AMMONIA in the last 168 hours.  ABG    Component Value Date/Time   PHART 7.398 04/22/2018 0410   PCO2ART 37.6 04/22/2018 0410   PO2ART 197 (H) 04/22/2018 0410   HCO3 22.3 04/22/2018 0410   TCO2 23 04/21/2018 1620   ACIDBASEDEF 1.5 04/22/2018 0410   O2SAT 98.7 04/22/2018 0410     Coagulation Profile: No results for input(s): INR, PROTIME in the last 168 hours.  Cardiac Enzymes: No results for input(s): CKTOTAL, CKMB, CKMBINDEX, TROPONINI in the last 168 hours.  HbA1C: No results found for: HGBA1C  CBG: Recent Labs  Lab 04/24/18 1111 04/24/18 1544 04/24/18 1946 04/24/18 2359 04/25/18 0343  GLUCAP 232* 223* 175* 197* 238*    Past Medical History  He,  has a  past medical history of CAD S/P percutaneous coronary angioplasty -- D1, Xience Xpedition DES 2.5 mm x 33 mm (11/24/2011), COPD (chronic obstructive pulmonary disease) ([place]), Essential hypertension (11/24/2011), Exertional dyspnea, chronic, Gout, History of Unstable angina (11/24/2011), Hyperlipidemia with target LDL less than 70 (11/24/2011), Obesity (BMI 30.0-34.9), and OSA (obstructive sleep apnea), uses oxygen at home did not tolerate cpap (11/24/2011).  Surgical History    Past Surgical History:  Procedure Laterality Date  . CERVICAL SPINE SURGERY  2012  . CORONARY ANGIOPLASTY WITH STENT PLACEMENT  11/23/2011   "1; first one"  . LEFT HEART CATHETERIZATION WITH CORONARY ANGIOGRAM N/A 11/23/2011   Procedure: LEFT HEART CATHETERIZATION WITH CORONARY ANGIOGRAM;  Surgeon: Leonie Man, MD;  Location: [facility] C[facility] LAB;  Service: Cardiovascular;  Laterality: N/A;     Social History   reports that he has been smoking cigarettes. He has a 40.00 pack-year smoking history. He has never used smokeless tobacco. He reports current alcohol use. He reports that he does not use drugs.   Family History   His family history includes Aneurysm in his father; Emphysema in his father; Heart attack in his brother; Heart disease in his sister.   Allergies Allergies  Allergen Reactions  . Codeine Nausea And Vomiting  . Duloxetine Hcl Other (See Comments)    Took at night and still felt very lethargic the following day.  (malaise)     Home Medications  Prior to Admission medications   Medication Sig Start Date End Date Taking? Authorizing Provider  albuterol (PROAIR HFA) 108 (90 BASE) MCG/ACT inhaler Inhale 2 puffs into the lungs every 6 (six) hours as needed for wheezing or shortness of breath.    [provider]  albuterol (PROVENTIL) (2.5 MG/3ML) 0.083% nebulizer solution Take 2.5 mg by nebulization every 6 (six) hours as needed for wheezing or shortness of breath.    [provider]  allopurinol  (ZYLOPRIM) 300 MG tablet Take 300 mg by mouth daily.    [provider]  amLODipine (NORVASC) 5 MG tablet Take 5 mg by mouth daily.    [provider]  aspirin 325 MG tablet Take 325 mg by mouth daily.    [provider]  atorvastatin (LIPITOR) 20 MG tablet Take 1 tablet (20 mg total) by mouth daily at 6 PM. 08/17/14 11/12/16  Leonie Man, MD  budesonide-formoterol [facility]) 160-4.5 MCG/ACT inhaler Take 2 puffs first thing in am and then another 2 puffs about 12 hours later. 04/13/14   Tanda Rockers, MD  clopidogrel (PLAVIX) 75 MG tablet Take 1 tablet (75 mg total) by mouth daily. 08/17/14   Leonie Man, MD  colchicine (COLCRYS) 0.6 MG tablet Take two tabs by mouth at once then a third tablet one hour later. Patient taking differently: Take 0.6 mg by mouth daily. Take two tabs by mouth at once then a third tablet one hour later. 04/22/14   Presson, Audelia Hives, PA  cyclobenzaprine (FLEXERIL) 10 MG tablet Take 10 mg by mouth daily.    [provider]  diazepam (VALIUM) 5 MG tablet Take 1 by mouth 1 hour  pre-procedure with very light food. May bring 2nd tablet to appointment. Patient taking differently: Take 5 mg by mouth See admin instructions. Take one tablet (5 mg) by mouth one hour prior to procedure with very light food. May bring 2nd tablet to appointment. 03/25/18   Magnus Sinning, MD  DULoxetine (CYMBALTA) 30 MG capsule Take 30 mg by mouth daily. 03/12/18   [provider]  gabapentin (NEURONTIN) 300 MG capsule Take 300 mg by mouth at bedtime. 03/12/18   [provider]  meclizine (ANTIVERT) 25 MG tablet Take 25 mg by mouth daily. 02/08/16   [provider]  meloxicam (MOBIC) 15 MG tablet Take 15 mg by mouth daily.    [provider]  metFORMIN (GLUCOPHAGE-XR) 500 MG  24 hr tablet Take 500 mg by mouth daily with supper. 03/12/18   [provider]  methylphenidate (RITALIN) 10 MG tablet Take 10 mg by  mouth daily.     [provider]  metoprolol succinate (TOPROL-XL) 25 MG 24 hr tablet Take 50 mg by mouth daily. 03/12/18   [provider]  nitroGLYCERIN (NITROSTAT) 0.4 MG SL tablet Place 1 tablet (0.4 mg total) under the tongue every 5 (five) minutes as needed for chest pain. 11/24/11 04/13/14  Isaiah Serge, NP  nortriptyline (PAMELOR) 25 MG capsule Take 25 mg by mouth at bedtime.    [provider]  omeprazole (PRILOSEC) 20 MG capsule Take 20 mg by mouth daily.    [provider]  oxyCODONE-acetaminophen (PERCOCET/ROXICET) 5-325 MG per tablet Take 1 tablet by mouth every 6 (six) hours as needed (pain).  02/09/14   [provider]  traMADol (ULTRAM) 50 MG tablet Take 1 tablet (50 mg total) by mouth every 6 (six) hours as needed for moderate pain or severe pain. 04/22/14   Presson, Audelia Hives, PA  zolpidem (AMBIEN) 10 MG tablet Take 10 mg by mouth at bedtime as needed for sleep.     [provider]     Critical care time:      Velna Ochs, M.D. - PGY3 Pager: [phone] 04/25/2018, 7:22 AM

## 2018-04-26 ENCOUNTER — Inpatient Hospital Stay (HOSPITAL_COMMUNITY): Payer: Medicare Other

## 2018-04-26 DIAGNOSIS — R7881 Bacteremia: Secondary | ICD-10-CM

## 2018-04-26 LAB — CBC
HCT: 36.2 % — ABNORMAL LOW (ref 39.0–52.0)
Hemoglobin: 10.7 g/dL — ABNORMAL LOW (ref 13.0–17.0)
MCH: 29.1 pg (ref 26.0–34.0)
MCHC: 29.6 g/dL — ABNORMAL LOW (ref 30.0–36.0)
MCV: 98.4 fL (ref 80.0–100.0)
Platelets: 178 10*3/uL (ref 150–400)
RBC: 3.68 MIL/uL — ABNORMAL LOW (ref 4.22–5.81)
RDW: 13.8 % (ref 11.5–15.5)
WBC: 10.7 10*3/uL — ABNORMAL HIGH (ref 4.0–10.5)
nRBC: 0.2 % (ref 0.0–0.2)

## 2018-04-26 LAB — BASIC METABOLIC PANEL
Anion gap: 7 (ref 5–15)
BUN: 37 mg/dL — ABNORMAL HIGH (ref 8–23)
CO2: 22 mmol/L (ref 22–32)
Calcium: 7.7 mg/dL — ABNORMAL LOW (ref 8.9–10.3)
Chloride: 116 mmol/L — ABNORMAL HIGH (ref 98–111)
Creatinine, Ser: 1.25 mg/dL — ABNORMAL HIGH (ref 0.61–1.24)
GFR calc Af Amer: >60 mL/min (ref >60)
GFR calc non Af Amer: 58 mL/min — ABNORMAL LOW (ref >60)
Glucose, Bld: 140 mg/dL — ABNORMAL HIGH (ref 70–99)
Potassium: 4.3 mmol/L (ref 3.5–5.1)
Sodium: 145 mmol/L (ref 135–145)

## 2018-04-26 LAB — GLUCOSE, CAPILLARY
Glucose-Capillary: 107 mg/dL — ABNORMAL HIGH (ref 70–99)
Glucose-Capillary: 136 mg/dL — ABNORMAL HIGH (ref 70–99)
Glucose-Capillary: 164 mg/dL — ABNORMAL HIGH (ref 70–99)
Glucose-Capillary: 169 mg/dL — ABNORMAL HIGH (ref 70–99)
Glucose-Capillary: 172 mg/dL — ABNORMAL HIGH (ref 70–99)
Glucose-Capillary: 209 mg/dL — ABNORMAL HIGH (ref 70–99)
Glucose-Capillary: 259 mg/dL — ABNORMAL HIGH (ref 70–99)

## 2018-04-26 LAB — CULTURE, BLOOD (ROUTINE X 2): Culture: NO GROWTH

## 2018-04-26 LAB — PHOSPHORUS: Phosphorus: 3.4 mg/dL (ref 2.5–4.6)

## 2018-04-26 LAB — MAGNESIUM: Magnesium: 1.6 mg/dL — ABNORMAL LOW (ref 1.7–2.4)

## 2018-04-26 MED ORDER — POLYVINYL ALCOHOL 1.4 % OP SOLN
1.0000 [drp] | Freq: Three times a day (TID) | OPHTHALMIC | Status: DC | PRN
  Administered 2018-04-26 (×2): 1 [drp] via OPHTHALMIC
  Filled 2018-04-26: qty 15

## 2018-04-26 MED ORDER — MAGNESIUM SULFATE 2 GM/50ML IV SOLN
2.0000 g | Freq: Once | INTRAVENOUS | Status: AC
  Administered 2018-04-26: 2 g via INTRAVENOUS
  Filled 2018-04-26: qty 50

## 2018-04-26 MED ORDER — BISACODYL 10 MG RE SUPP
10.0000 mg | Freq: Once | RECTAL | Status: AC
  Administered 2018-04-26: 10 mg via RECTAL
  Filled 2018-04-26: qty 1

## 2018-04-26 MED ORDER — LACTULOSE 10 GM/15ML PO SOLN
20.0000 g | Freq: Once | ORAL | Status: AC
  Administered 2018-04-26: 20 g
  Filled 2018-04-26: qty 30

## 2018-04-26 MED ORDER — VITAL AF 1.2 CAL PO LIQD
1000.0000 mL | ORAL | Status: DC
  Administered 2018-04-26 – 2018-04-27 (×2): 1000 mL

## 2018-04-26 MED ORDER — LORAZEPAM 1 MG PO TABS: 1.0000 mg | ORAL_TABLET | Freq: Two times a day (BID) | ORAL | Status: DC

## 2018-04-26 MED ORDER — PRO-STAT SUGAR FREE PO LIQD
30.0000 mL | Freq: Every day | ORAL | Status: DC
  Administered 2018-04-26 – 2018-04-27 (×2): 30 mL
  Filled 2018-04-26 (×2): qty 30

## 2018-04-26 MED ORDER — LORAZEPAM 1 MG PO TABS
1.0000 mg | ORAL_TABLET | Freq: Two times a day (BID) | ORAL | Status: DC
  Administered 2018-04-26 – 2018-04-27 (×3): 1 mg
  Filled 2018-04-26 (×3): qty 1

## 2018-04-26 MED ORDER — SODIUM CHLORIDE 0.9 % IV SOLN
INTRAVENOUS | Status: DC | PRN
  Administered 2018-04-26 – 2018-04-27 (×2): 500 mL via INTRAVENOUS
  Administered 2018-04-27: 250 mL via INTRAVENOUS
  Administered 2018-04-29: 1000 mL via INTRAVENOUS

## 2018-04-26 MED ORDER — LACTULOSE 10 GM/15ML PO SOLN: 10.0000 g | Freq: Two times a day (BID) | ORAL | Status: DC | PRN

## 2018-04-26 MED ORDER — ACETAMINOPHEN 325 MG PO TABS
650.0000 mg | ORAL_TABLET | ORAL | Status: DC | PRN
  Administered 2018-04-26 – 2018-04-27 (×6): 650 mg
  Filled 2018-04-26 (×5): qty 2

## 2018-04-26 NOTE — Progress Notes (Addendum)
Nutrition Follow-up  DOCUMENTATION CODES:   Not applicable  INTERVENTION:  Tube Feeding Recommendation   Change Vital AF 1.2 at 70 ml/hr and Prostat 30 mL. Regimen provides 2116 kcal, 141 grams of protein and 1362 ml water. Regimen meets 100% of calorie and protein needs.    NUTRITION DIAGNOSIS:   Inadequate oral intake related to acute illness as evidenced by NPO status.  Ongoing  GOAL:   Patient will meet greater than or equal to 90% of their needs  Progressing  MONITOR:   Vent status, Labs, Weight trends   ASSESSMENT:   68 yo male admitted with acute respiratory failure related to RUL pneumonia requiring intubation on admission, AKI, DKA. PMH includes COPD on home oxygen, CAD, HTN, DM  Patient is currently intubated on ventilator support MV: 10.5 L/min Temp (24hrs), Avg:100.8 F (38.2 C), Min:98.7 F (37.1 C), Max:102.6 F (39.2 C)  Propofol: None   OG tube with tip in stomach. Abdomen soft and distended. Constipation. LBM unknown. Bowel regimen in place, senokot BID, dolcolax suppository X 1 and add lactulose if no BM after suppository.   Pt tolerating Vital HP @ 70 ml/hr.    Pt sedated; no family at bedside. Unable to obtain diet and weight history at this time.  Current weight 84.2 kg, admission weight 81.7kg. net + 8.4L. Per the chart, no significant weight loss recently. Used 78.4 kg weight for estimated nutrition needs.   Medications reviewed and include: Sliding Scale Insulin Free water flush 200 mL Q4 Levemir Senna Normal Saline Precedex Ancef Pepcid Fentanyl  Labs reviewed: CBGs 259, 169, 107, 136 X 12 hours  BUN 37 (H)  Creatinine 1.25 (H)  Mag 1.6 (L) Calcium 8.7 (L) adjusted with albumin 2.7 (1/5)  TGs 216 (1/8)   Diet Order:   Diet Order            Diet NPO time specified  Diet effective now              EDUCATION NEEDS:   Not appropriate for education at this time  Skin:  Skin Assessment: Reviewed RN  Assessment  Last BM:  Unknown  Height:   Ht Readings from Last 1 Encounters:  04/21/18 5\' 6"  (1.676 m)    Weight:   Wt Readings from Last 1 Encounters:  04/26/18 84.2 kg    Ideal Body Weight:  64.5 kg  BMI:  Body mass index is 29.96 kg/m.  Estimated Nutritional Needs:   Kcal:  2096 kcal   Protein:  120-140 g  Fluid:  >/= 2 L   Mauricia Area, MS, Dietetic Intern Pager: [phone] After hours Pager: [phone]

## 2018-04-26 NOTE — Progress Notes (Addendum)
NAME:  Paul [name], MRN:  [id number], DOB:  1951/01/06, LOS: 5 ADMISSION DATE:  04/21/2018, CONSULTATION DATE:  1/5 REFERRING MD:  EDP, CHIEF COMPLAINT:  AMS  Brief History   68yo male with COPD on supplemental O2, CAD s/p stents 2013 presenting after being found confused and dyspneic by family. Profoundly hypoxic in ED. Intubated and found to have RUL pneumonia, strep pneumo bacteremia, and DKA.   Past Medical History  HTN CAD COPD on supplemental O2 OSA on CPAP Prediabetic   Significant Hospital Events   1/5 > Intubated, admit   Consults:    Procedures:  1/5 ETT >   Significant Diagnostic Tests:  1/5 > CXR with RUL pneumonia   Micro Data:  1/5 Blood culture >> Strep Pneumo 1/5 Urine culture >> No growth (final)  1/5 resp culture >> MSSA 1/6 RVP >> + RSV  1/9 Resp culture >> No growth < 24 hours 1/9 Blood culture >> No growth < 24 hours  Antimicrobials:  1/5 vanc >> 1/5 1/5 azithro >> DC'd 1/6 1/5 ceftriaxone >> DC'd 1/8 1/8 Ancef >>  Interim history/subjective:  Intermittent agitation overnight requiring increasing doses of precedex and fentanyl. Febrile this morning.   Objective   Blood pressure 93/72, pulse 72, temperature (!) 102.6 F (39.2 C), temperature source Axillary, resp. rate 16, height 5\' 6"  (1.676 m), weight 84.2 kg, SpO2 96 %.    Vent Mode: PRVC FiO2 (%):  [40 %] 40 % Set Rate:  [18 bmp] 18 bmp Vt Set:  [510 mL] 510 mL PEEP:  [5 cmH20] 5 cmH20 Pressure Support:  [10 cmH20] 10 cmH20 Plateau Pressure:  [13 cmH20-14 cmH20] 14 cmH20   Intake/Output Summary (Last 24 hours) at 04/26/2018 0658 Last data filed at 04/26/2018 0500 Gross per 24 hour  Intake 3597.98 ml  Output 1480 ml  Net 2117.98 ml   Filed Weights   04/24/18 0330 04/25/18 0500 04/26/18 0500  Weight: 80 kg 80.7 kg 84.2 kg    Examination: General: Intubated, sedated HENT: AT, [place], endotracheal tube in place Lungs: On vent, Diffuse rhonchi, no wheezing Cardiovascular: RRR, no  m/r/g Abdomen: distended, soft,  Extremities: +pitting edema Skin: c/d/i   Resolved Hospital Problem list   DKA  Assessment & Plan:   Mr. Mccollam is a 68yo male with COPD on supplemental O2, CAD s/p stents placed 2013, and HTN presenting after being found confused and dyspneic by family. Profoundly hypoxic on arrival requiring intubation. Found to have RUL PNA and DKA.  Acute Hypoxic Respiratory Failure: Secondary to RUL PNA. Blood cultures with Strep Pneumonia, RVP +RSV, and Sputum cx with MSSA. Continues to have fevers. CXR looks stable.   - CT Chest Wo Contrast  - F/u repeat cultures - Full vent support - VAP prevention  - Daily WUA / SBT - Continue precedex, fentanyl drip for RASS goal  Encephalopathy: Agitation overnight. Scheduled low dose ativan yesterday in case of withdrawal.  - Change IV ativan -> 1mg  po q12h - Wean sedation as able   Sepsis / Strep Pneumo Bacteremia: BP soft but possibly from precedex. Persistent fevers, 102 this morning.  - Continue Ancef (Day 6 / 14) - Trend fever curve - Repeat blood cultures & resp cultures NGTD  Mild Hypernatremia: Improved  - Continue free water 200 q8 per tube  Constipation: No BM but passing gas, abdomen soft  - BID Senokot - Doculax suppository x 1 - Will add lactulose if no BM after suppository   COPD: On home O2 -  Continue brovana & pulmicort   Hyperglycemia: Improved on increased regimen, one hypoglycemic episode to 63 overnight.  - Continue Levemir 20 BID - Decrease novolog 12 -> 9 units q4h - Continue resistant SSI q4h  Hx HTN: Hypotensive on precedex - Hold home lopressor 25 BID - Holding home amlodipine - IV prn hydralazine  AKI: Initially resolved, now with increase in Scr. Possibly ATN with hypotension.  - I&Os - Trend BMPs   CAD s/p Stent Placement 2013 - plavix 75 mg qd  Best practice:  Diet: Tube feeds Pain/Anxiety/Delirium protocol (if indicated): Precedex, fentanyl, RASS goal -1 VAP  protocol (if indicated): ordered DVT prophylaxis: lovenox GI prophylaxis: Pepcid Glucose control: Levemir, novolog, SSI Mobility: bedrest, intubated Code Status: full Family Communication: Son in Sports coach updated at bedside 1/7 Disposition: ICU   Critical care time:      Velna Ochs, M.D. - PGY3 Pager: [phone] 04/26/2018, 6:58 AM

## 2018-04-26 NOTE — Progress Notes (Signed)
Patient was transported to CT & back to 3M09 without any complications.

## 2018-04-26 NOTE — Progress Notes (Signed)
68 year old man with COPD on home oxygen admitted 1/5 with right upper lobe community-acquired pneumonia requiring mechanical ventilation. FEV1 81% 2015  He remains febrile and severely agitated on Precedex and fentanyl drip.  Weans well on pressure support when still sedated with RA SS -2 but hyperventilates immediately as sedation is cut off. Otherwise no pallor, icterus, decreased breath sounds on right, clear on left, no rhonchi, S1-S2 normal, soft and nontender abdomen.  Chest x-ray personally reviewed which shows consolidation right upper lobe with patchy infiltrate in the right lower lobe, clear on the left. Obtain CT chest without contrast with again shows consolidation of the right upper lobe.  Labs show normal electrolytes, mild increased creatinine from 1.1-1.2, no leukocytosis, mild anemia.  Impression/plan  Right upper lobe pneumococcal pneumonia/bacteremia/RSV infection/MSSA pneumonia -no evidence of empyema or necrosis to explain persistent fever, may have to change back to ceftriaxone.  Leukocytosis has resolved which is reassuring.  Acute hypoxic respiratory failure-seems to tolerate spontaneous breathing trials with pressure support 10/5 but would need better control of agitation and secretions without extubation.  Acute encephalopathy-increase low-dose Ativan via tube to 1 twice daily, continue Precedex and minimize fentanyl drip.  Uncontrolled hyperglycemia/diabetes type 2-DKA resolved, decrease Levemir to 10 mg once daily and SSI resistant scale  Daughter updated at bedside.  My independent critical care time x 73m  Verdie Barrows V. Elsworth Soho MD

## 2018-04-27 ENCOUNTER — Inpatient Hospital Stay (HOSPITAL_COMMUNITY): Payer: Medicare Other

## 2018-04-27 LAB — CBC
HCT: 36.5 % — ABNORMAL LOW (ref 39.0–52.0)
Hemoglobin: 11.1 g/dL — ABNORMAL LOW (ref 13.0–17.0)
MCH: 29.9 pg (ref 26.0–34.0)
MCHC: 30.4 g/dL (ref 30.0–36.0)
MCV: 98.4 fL (ref 80.0–100.0)
Platelets: 182 10*3/uL (ref 150–400)
RBC: 3.71 MIL/uL — ABNORMAL LOW (ref 4.22–5.81)
RDW: 13.7 % (ref 11.5–15.5)
WBC: 11 10*3/uL — ABNORMAL HIGH (ref 4.0–10.5)
nRBC: 0 % (ref 0.0–0.2)

## 2018-04-27 LAB — BASIC METABOLIC PANEL
Anion gap: 7 (ref 5–15)
BUN: 35 mg/dL — ABNORMAL HIGH (ref 8–23)
CO2: 22 mmol/L (ref 22–32)
Calcium: 7.8 mg/dL — ABNORMAL LOW (ref 8.9–10.3)
Chloride: 115 mmol/L — ABNORMAL HIGH (ref 98–111)
Creatinine, Ser: 0.96 mg/dL (ref 0.61–1.24)
GFR calc Af Amer: >60 mL/min (ref >60)
GFR calc non Af Amer: >60 mL/min (ref >60)
Glucose, Bld: 214 mg/dL — ABNORMAL HIGH (ref 70–99)
Potassium: 4.3 mmol/L (ref 3.5–5.1)
Sodium: 144 mmol/L (ref 135–145)

## 2018-04-27 LAB — GLUCOSE, CAPILLARY
Glucose-Capillary: 153 mg/dL — ABNORMAL HIGH (ref 70–99)
Glucose-Capillary: 198 mg/dL — ABNORMAL HIGH (ref 70–99)
Glucose-Capillary: 223 mg/dL — ABNORMAL HIGH (ref 70–99)
Glucose-Capillary: 277 mg/dL — ABNORMAL HIGH (ref 70–99)
Glucose-Capillary: 79 mg/dL (ref 70–99)
Glucose-Capillary: 98 mg/dL (ref 70–99)

## 2018-04-27 LAB — PHOSPHORUS: Phosphorus: 2.6 mg/dL (ref 2.5–4.6)

## 2018-04-27 LAB — MAGNESIUM: Magnesium: 2 mg/dL (ref 1.7–2.4)

## 2018-04-27 MED ORDER — BUDESONIDE 0.5 MG/2ML IN SUSP
0.5000 mg | Freq: Two times a day (BID) | RESPIRATORY_TRACT | Status: DC
  Administered 2018-04-27 – 2018-05-03 (×12): 0.5 mg via RESPIRATORY_TRACT
  Filled 2018-04-27 (×12): qty 2

## 2018-04-27 MED ORDER — SODIUM CHLORIDE 0.9 % IV SOLN
2.0000 g | INTRAVENOUS | Status: DC
  Administered 2018-04-27 – 2018-05-03 (×7): 2 g via INTRAVENOUS
  Filled 2018-04-27 (×8): qty 20

## 2018-04-27 MED ORDER — MIDAZOLAM HCL 2 MG/2ML IJ SOLN
0.5000 mg | Freq: Once | INTRAMUSCULAR | Status: AC
  Administered 2018-04-27: 0.5 mg via INTRAVENOUS
  Filled 2018-04-27: qty 2

## 2018-04-27 NOTE — Progress Notes (Signed)
68 year old man with COPD on home oxygen admitted 1/5 with right upper lobe community-acquired pneumonia requiring mechanical ventilation. FEV1 81% 2015  He remains febrile, less agitated on Precedex, fentanyl drip was tapered off, had a period of breakthrough agitation required Versed this morning and is less responsive but woke up later on and follows one-step commands. No pallor, icterus, decreased breath sounds on right, clear on left, no rhonchi, S1-S2 normal, soft and nontender abdomen.  Chest x-ray 1/11 personally reviewed which shows improved aeration in the right upper lobe consolidation, clear lungs are left. CT chest was reviewed.  Labs showed normal lites, mild leukocytosis, mild anemia.  Impression/plan  Acute hypoxic respiratory failure-tolerating spontaneous breathing trials with pressure support 5/5, proceed with extubation  Right upper lobe pneumococcal pneumonia/bacteremia/RSV infection/MSSA pneumonia -due to persistent fevers will change back to ceftriaxone from Ancef  Acute encephalopathy-continue Precedex and low-dose Ativan  Uncontrolled hyperglycemia/diabetes type 2 -DKA resolved, continue Levemir 20 units since he will be n.p.o. next 24 hours until swallow evaluation  Son and daughter updated at bedside. My independent critical care time x 63m  Kara Mead MD. FCCP. [facility] Pager [phone] If no response call [phone]   04/27/2018

## 2018-04-27 NOTE — Plan of Care (Signed)
  Problem: Education: Goal: Knowledge of General Education information will improve Description Including pain rating scale, medication(s)/side effects and non-pharmacologic comfort measures Outcome: Progressing   Problem: Health Behavior/Discharge Planning: Goal: Ability to manage health-related needs will improve Outcome: Progressing   Problem: Clinical Measurements: Goal: Ability to maintain clinical measurements within normal limits will improve Outcome: Progressing Goal: Will remain free from infection Outcome: Progressing Goal: Diagnostic test results will improve Outcome: Progressing Goal: Respiratory complications will improve Outcome: Progressing Goal: Cardiovascular complication will be avoided Outcome: Progressing   Problem: Activity: Goal: Risk for activity intolerance will decrease Outcome: Progressing   Problem: Nutrition: Goal: Adequate nutrition will be maintained Outcome: Progressing   Problem: Coping: Goal: Level of anxiety will decrease Outcome: Progressing   Problem: Elimination: Goal: Will not experience complications related to bowel motility Outcome: Progressing Goal: Will not experience complications related to urinary retention Outcome: Progressing   Problem: Pain Managment: Goal: General experience of comfort will improve Outcome: Progressing   Problem: Safety: Goal: Ability to remain free from injury will improve Outcome: Progressing   Problem: Skin Integrity: Goal: Risk for impaired skin integrity will decrease Outcome: Progressing   Problem: Activity: Goal: Ability to tolerate increased activity will improve Outcome: Progressing   Problem: Respiratory: Goal: Ability to maintain a clear airway and adequate ventilation will improve Outcome: Progressing   Problem: Role Relationship: Goal: Method of communication will improve Outcome: Progressing   Problem: Education: Goal: Ability to describe self-care measures that may  prevent or decrease complications (Diabetes Survival Skills Education) will improve Outcome: Progressing Goal: Individualized Educational Video(s) Outcome: Progressing   Problem: Cardiac: Goal: Ability to maintain an adequate cardiac output will improve Outcome: Progressing   Problem: Health Behavior/Discharge Planning: Goal: Ability to identify and utilize available resources and services will improve Outcome: Progressing Goal: Ability to manage health-related needs will improve Outcome: Progressing   Problem: Fluid Volume: Goal: Ability to achieve a balanced intake and output will improve Outcome: Progressing   Problem: Metabolic: Goal: Ability to maintain appropriate glucose levels will improve Outcome: Progressing   Problem: Nutritional: Goal: Maintenance of adequate nutrition will improve Outcome: Progressing Goal: Maintenance of adequate weight for body size and type will improve Outcome: Progressing   Problem: Respiratory: Goal: Will regain and/or maintain adequate ventilation Outcome: Progressing   Problem: Urinary Elimination: Goal: Ability to achieve and maintain adequate renal perfusion and functioning will improve Outcome: Progressing   Extubated today. Continuing to improve.

## 2018-04-27 NOTE — Progress Notes (Signed)
eLink Physician-Brief Progress Note Patient Name: Paul [name] DOB: [birth year] MRN: [id number]   Date of Service  04/27/2018  HPI/Events of Note  Patient is NPO and is not able to get scheduled Ativan 1 mg PO.   eICU Interventions  Will order: 1. Versed 0.5 mg IV X 1 now.      Intervention Category Major Interventions: Other:  [name] 04/27/2018, 9:24 PM

## 2018-04-27 NOTE — Procedures (Signed)
Extubation Procedure Note  Patient Details:   Name: Paul [name] DOB: [birth year] MRN: [id number]   Airway Documentation:    Vent end date: 04/27/18 Vent end time: 1250   Evaluation  O2 sats: stable throughout Complications: No apparent complications Patient did tolerate procedure well. Bilateral Breath Sounds: Other (Comment)(coarse)   Yes   Patient extubated to 3L [place] without complications. RN at bedside. Positive cuff leak noted. Patient tolerating well at this time. Will continue to monitor.  Herbie Baltimore 04/27/2018, 12:54 PM

## 2018-04-28 DIAGNOSIS — J9690 Respiratory failure, unspecified, unspecified whether with hypoxia or hypercapnia: Secondary | ICD-10-CM

## 2018-04-28 LAB — GLUCOSE, CAPILLARY
Glucose-Capillary: 83 mg/dL (ref 70–99)
Glucose-Capillary: 112 mg/dL — ABNORMAL HIGH (ref 70–99)
Glucose-Capillary: 193 mg/dL — ABNORMAL HIGH (ref 70–99)
Glucose-Capillary: 233 mg/dL — ABNORMAL HIGH (ref 70–99)
Glucose-Capillary: 216 mg/dL — ABNORMAL HIGH (ref 70–99)

## 2018-04-28 LAB — BASIC METABOLIC PANEL
Anion gap: 6 (ref 5–15)
BUN: 27 mg/dL — ABNORMAL HIGH (ref 8–23)
CO2: 23 mmol/L (ref 22–32)
Calcium: 8 mg/dL — ABNORMAL LOW (ref 8.9–10.3)
Chloride: 117 mmol/L — ABNORMAL HIGH (ref 98–111)
Creatinine, Ser: 0.86 mg/dL (ref 0.61–1.24)
GFR calc Af Amer: >60 mL/min (ref >60)
GFR calc non Af Amer: >60 mL/min (ref >60)
Glucose, Bld: 156 mg/dL — ABNORMAL HIGH (ref 70–99)
Potassium: 3.9 mmol/L (ref 3.5–5.1)
Sodium: 146 mmol/L — ABNORMAL HIGH (ref 135–145)

## 2018-04-28 LAB — CULTURE, RESPIRATORY W GRAM STAIN

## 2018-04-28 MED ORDER — LORAZEPAM 2 MG/ML IJ SOLN
1.0000 mg | Freq: Two times a day (BID) | INTRAMUSCULAR | Status: DC
  Administered 2018-04-28: 1 mg via INTRAVENOUS
  Filled 2018-04-28: qty 1

## 2018-04-28 MED ORDER — ORAL CARE MOUTH RINSE
15.0000 mL | Freq: Two times a day (BID) | OROMUCOSAL | Status: DC
  Administered 2018-04-28 – 2018-05-03 (×10): 15 mL via OROMUCOSAL

## 2018-04-28 MED ORDER — INSULIN ASPART 100 UNIT/ML ~~LOC~~ SOLN
0.0000 [IU] | SUBCUTANEOUS | Status: DC
  Administered 2018-04-28: 3 [IU] via SUBCUTANEOUS
  Administered 2018-04-28 – 2018-04-29 (×3): 5 [IU] via SUBCUTANEOUS
  Administered 2018-04-29: 8 [IU] via SUBCUTANEOUS
  Administered 2018-04-29: 3 [IU] via SUBCUTANEOUS
  Administered 2018-04-29: 2 [IU] via SUBCUTANEOUS
  Administered 2018-04-29: 8 [IU] via SUBCUTANEOUS
  Administered 2018-04-29 – 2018-04-30 (×2): 5 [IU] via SUBCUTANEOUS
  Administered 2018-04-30 (×3): 8 [IU] via SUBCUTANEOUS
  Administered 2018-04-30: 11 [IU] via SUBCUTANEOUS
  Administered 2018-04-30 – 2018-05-01 (×3): 8 [IU] via SUBCUTANEOUS
  Administered 2018-05-01: 3 [IU] via SUBCUTANEOUS
  Administered 2018-05-01: 8 [IU] via SUBCUTANEOUS
  Administered 2018-05-01 – 2018-05-02 (×4): 5 [IU] via SUBCUTANEOUS
  Administered 2018-05-02 – 2018-05-03 (×3): 8 [IU] via SUBCUTANEOUS
  Administered 2018-05-03: 3 [IU] via SUBCUTANEOUS
  Administered 2018-05-03: 5 [IU] via SUBCUTANEOUS

## 2018-04-28 MED ORDER — HALOPERIDOL LACTATE 5 MG/ML IJ SOLN
2.0000 mg | Freq: Four times a day (QID) | INTRAMUSCULAR | Status: DC | PRN
  Administered 2018-04-28 (×2): 2 mg via INTRAVENOUS
  Administered 2018-04-29 (×4): 4 mg via INTRAVENOUS
  Administered 2018-05-01: 2 mg via INTRAVENOUS
  Filled 2018-04-28 (×7): qty 1

## 2018-04-28 MED ORDER — DEXTROSE-NACL 5-0.45 % IV SOLN
INTRAVENOUS | Status: AC
  Administered 2018-04-28: 12:00:00 via INTRAVENOUS

## 2018-04-28 MED ORDER — ZOLPIDEM TARTRATE 5 MG PO TABS: 5.0000 mg | ORAL_TABLET | Freq: Every day | ORAL | Status: DC

## 2018-04-28 NOTE — Evaluation (Signed)
Clinical/Bedside Swallow Evaluation Patient Details  Name: Paul [name] MRN: [id number] Date of Birth: 1950/08/05  Today's Date: 04/28/2018 Time: SLP Start Time (ACUTE ONLY): 0840 SLP Stop Time (ACUTE ONLY): 0851 SLP Time Calculation (min) (ACUTE ONLY): 11 min  Past Medical History:  Past Medical History:  Diagnosis Date  . CAD S/P percutaneous coronary angioplasty -- D1, Xience Xpedition DES 2.5 mm x 33 mm 11/24/2011   a) Mild-to-moderate 30-40% lesions in the RCA, LAD and Circumflex. b) CULPRIT LESION: long tubular 70-80% lesion in D1 with FFR of 0.7 --> PCI w/ Xience Xpedition DES 2.5 mm x 30 mm (2.65 MM); c) Lexiscan Myoview 11/2013: No Ischemia or Infarct (Inferior Gut Attenuation) EF 63%.  Marland Kitchen COPD (chronic obstructive pulmonary disease) ([place])    "don't have full case of it; I'm right there at it"  . Essential hypertension 11/24/2011  . Exertional dyspnea, chronic   . Gout   . History of Unstable angina 11/24/2011   Referred for cardiac catheterization  . Hyperlipidemia with target LDL less than 70 11/24/2011  . Obesity (BMI 30.0-34.9)   . OSA (obstructive sleep apnea), uses oxygen at home did not tolerate cpap 11/24/2011   Past Surgical History:  Past Surgical History:  Procedure Laterality Date  . CERVICAL SPINE SURGERY  2012  . CORONARY ANGIOPLASTY WITH STENT PLACEMENT  11/23/2011   "1; first one"  . LEFT HEART CATHETERIZATION WITH CORONARY ANGIOGRAM N/A 11/23/2011   Procedure: LEFT HEART CATHETERIZATION WITH CORONARY ANGIOGRAM;  Surgeon: Leonie Man, MD;  Location: [facility] CATH LAB;  Service: Cardiovascular;  Laterality: N/A;   HPI:  68 year old man with COPD on home oxygen admitted 1/5 with right upper lobe community-acquired pneumonia requiring mechanical ventilation. Intubated until 1/11.    Assessment / Plan / Recommendation Clinical Impression  Pt demonstrates multiple signs concerning for significant acute reversible dysphagia following 9 day intubation. Pt is alert, confused and  cooperative, able to attempt to follow commands though he is easily distracted and inappropriate. He is severely dyphonic and mildly dysarthric, mostly unintelligible. Trials of ice chips and puree result in very weak non productive cough. Risk of aspiration with all PO at this point is high, oral meds with puree advised only if absolutely necessary. Pt is not yet ready for diet or instrumental testing. Will f/u for PO trials as MBS/FEES will likely be needed for diet recommendation.  SLP Visit Diagnosis: Dysphagia, oropharyngeal phase (R13.12)    Aspiration Risk  Severe aspiration risk    Diet Recommendation NPO        Other  Recommendations Oral Care Recommendations: Oral care QID   Follow up Recommendations        Frequency and Duration            Prognosis        Swallow Study   General HPI: 68 year old man with COPD on home oxygen admitted 1/5 with right upper lobe community-acquired pneumonia requiring mechanical ventilation. Intubated until 1/11.  Type of Study: Bedside Swallow Evaluation Previous Swallow Assessment: none Diet Prior to this Study: NPO Temperature Spikes Noted: No Respiratory Status: Nasal cannula History of Recent Intubation: Yes Length of Intubations (days): 9 days Date extubated: 04/27/18 Behavior/Cognition: Alert;Cooperative;Confused;Requires cueing;Distractible Oral Cavity Assessment: Dry Oral Care Completed by SLP: No Oral Cavity - Dentition: Adequate natural dentition Vision: Functional for self-feeding Self-Feeding Abilities: Needs assist Patient Positioning: Upright in bed Baseline Vocal Quality: Breathy;Hoarse Volitional Cough: Weak Volitional Swallow: Able to elicit    Oral/Motor/Sensory Function Overall Oral Motor/Sensory Function:  Within functional limits   Ice Chips Ice chips: Impaired Presentation: Spoon Pharyngeal Phase Impairments: Cough - Immediate   Thin Liquid Thin Liquid: Impaired Presentation: Cup Oral Phase Impairments:  Poor awareness of bolus;Reduced labial seal    Nectar Thick Nectar Thick Liquid: Not tested   Honey Thick Honey Thick Liquid: Not tested   Puree Puree: Impaired Presentation: Spoon Pharyngeal Phase Impairments: Wet Vocal Quality;Cough - Immediate   Solid            Benecio Kluger, Katherene Ponto 04/28/2018,10:52 AM

## 2018-04-28 NOTE — Progress Notes (Addendum)
NAME:  Paul [name], MRN:  [id number], DOB:  [birth year], LOS: 7 ADMISSION DATE:  04/21/2018, CONSULTATION DATE:  1/5 REFERRING MD:  EDP, CHIEF COMPLAINT:  AMS  Brief History   68yo male with COPD on supplemental O2, CAD s/p stents 2013 presenting after being found confused and dyspneic by family. Profoundly hypoxic in ED. Intubated and found to have RUL pneumonia, strep pneumo bacteremia, and DKA.   Past Medical History  HTN CAD COPD on supplemental O2 OSA on CPAP Prediabetic   Significant Hospital Events   1/5 > Intubated, admit  1/11 > extubated  Consults:    Procedures:  1/5 ETT >   Significant Diagnostic Tests:  1/5 > CXR with RUL pneumonia   Micro Data:  1/5 Blood culture >> Strep Pneumo 1/5 Urine culture >> No growth (final)  1/5 resp culture >> MSSA 1/6 RVP >> + RSV  1/9 Resp culture >> No growth < 24 hours 1/9 Blood culture >> No growth < 24 hours  Antimicrobials:  1/5 vanc >> 1/5 1/5 azithro >> DC'd 1/6 1/5 ceftriaxone >> DC'd 1/8 1/8 Ancef >> DC'd 1/11  1/11 Ceftriaxone >>   Interim history/subjective:  Agitated overnight, unable to get PO ativan.  Objective   Blood pressure 128/67, pulse 72, temperature 98.6 F (37 C), temperature source Oral, resp. rate 13, height 5\' 6"  (1.676 m), weight 82.4 kg, SpO2 98 %.    Vent Mode: PSV;CPAP FiO2 (%):  [40 %] 40 % PEEP:  [5 cmH20] 5 cmH20 Pressure Support:  [5 cmH20] 5 cmH20   Intake/Output Summary (Last 24 hours) at 04/28/2018 3810 Last data filed at 04/28/2018 0600 Gross per 24 hour  Intake 1223.4 ml  Output 965 ml  Net 258.4 ml   Filed Weights   04/26/18 0500 04/27/18 0500 04/28/18 0500  Weight: 84.2 kg 84.8 kg 82.4 kg    Examination: General: Agitated, altered  Lungs: Rhonchi bilateral, [place] Cardiovascular: RRR, no m/r/g Abdomen: distended, soft,  Extremities: +pitting edema Skin: c/d/i   Resolved Hospital Problem list   DKA AKI  Assessment & Plan:   Paul [name] is a 68yo male with COPD  on supplemental O2, CAD s/p stents placed 2013, and HTN presenting after being found confused and dyspneic by family. Profoundly hypoxic on arrival requiring intubation. Found to have RUL PNA and DKA.  Acute Hypoxic Respiratory Failure: Improving. Extubated 1/11 stable on [place]. Secondary to RUL PNA. Blood cultures with Strep Pneumonia, RVP +RSV, and Sputum cx with MSSA. - Continue Ceftriaxone  - Wean oxygen as able   Encephalopathy: ICU Delirium.  - Precedex  - D/c ativan - IV haldol 2-4 mg q6h prn   Strep Pneumo Bacteremia:  - Continue Ceftriaxone (Day 8 / 14) - Trend fever curve - Repeat blood cultures & resp cultures NGTD  Mild Hypernatremia: NPO, no NGT. Change free water per tube -> D5 1/2 NS @ 75 x 12 hours.  Hyperglycemia: Improved, will back off on regimen now that patient is NPO without feeds - Discontinue Levemir - Discontinue scheduled novolog  - Moderate SSI q4h - CBG checks q4  Constipation: Large BM 1/11 after lactulose  - BID Senokot  COPD: On home O2 - Continue brovana & pulmicort   Hx HTN: Soft BPs on precedex - Holding home lopressor 25 BID - Holding home amlodipine - IV prn hydralazine  CAD s/p Stent Placement 2013 - plavix 75 mg qd  Best practice:  Diet: NPO Pain/Anxiety/Delirium protocol (if indicated): Precedex, PRN haldol VAP protocol (  if indicated): N/A DVT prophylaxis: lovenox GI prophylaxis: Pepcid Glucose control: moderate SSI Mobility: bedrest Code Status: full Family Communication: Son in law updated at bedside 1/7 Disposition: ICU   Critical care time:      Velna Ochs, M.D. - PGY3 Pager: [phone] 04/28/2018, 6:49 AM

## 2018-04-28 NOTE — Progress Notes (Signed)
Rehab Admissions Coordinator Note:  Patient was screened by Cleatrice Burke for appropriateness for an Inpatient Acute Rehab Consult per PT recs. .  At this time, we are recommending Inpatient Rehab consult. Please place order for conuslt.  Cleatrice Burke RN MSN 04/28/2018, 7:58 PM  I can be reached at [phone].

## 2018-04-28 NOTE — Evaluation (Signed)
Physical Therapy Evaluation Patient Details Name: Paul [name] MRN: [id number] DOB: March 14, 1951 Today's Date: 04/28/2018   History of Present Illness  68 year old man with COPD on home oxygen admitted 1/5 with right upper lobe community-acquired pneumonia requiring mechanical ventilation. Extubated 1/11. PMH includes but not limited to: OSA, Obesity, COPD, CAD, Gout, HTN, Chronic exertional dyspnea, C spine surgery 2012.     Clinical Impression  Pt admitted with above diagnosis. Pt currently with functional limitations due to the deficits listed below (see PT Problem List).history obtained by family, PTA patient living alone independent with mobility, ADL's, driving etc. Today patient requires max A to come to sitting EOB, mildly confused and restless this visit, deconditioned and weak. Tolerated EOB activities for several minutes before fatigue. Discussed with family delirium precautions and gentle ROM exercises, goals of therapy, use of IS with RN. Anticipate patient will progress well with therapy and will update recs if/when appropriate.  Pt will benefit from skilled PT to increase their independence and safety with mobility to allow discharge to the venue listed below.    135/83 seated 96% SpO2 on 3L 105 HR    Follow Up Recommendations CIR    Equipment Recommendations  (TBD)    Recommendations for Other Services Rehab consult     Precautions / Restrictions Precautions Precautions: Fall Restrictions Weight Bearing Restrictions: No      Mobility  Bed Mobility Overal bed mobility: Needs Assistance Bed Mobility: Supine to Sit;Sit to Supine     Supine to sit: Max assist Sit to supine: Max assist   General bed mobility comments: Max A to bring patient sitting assistance wtih trunk, support at all times, strong posterior and L lean noted but attempting to correct this visit.   Transfers                 General transfer comment: unable to attempt yet due to  confusion  Ambulation/Gait                Stairs            Wheelchair Mobility    Modified Rankin (Stroke Patients Only)       Balance Overall balance assessment: Needs assistance   Sitting balance-Leahy Scale: Poor(max A currently)                                       Pertinent Vitals/Pain Pain Assessment: Faces Faces Pain Scale: Hurts a little bit Pain Location: generalized  Pain Descriptors / Indicators: Discomfort;Grimacing    Home Living Family/patient expects to be discharged to:: Inpatient rehab                      Prior Function Level of Independence: Independent         Comments: lives at home alone rides motorcylces, per family not the best diest or activity level and has had some leg weakness as of late. indepenent with ADLs     Hand Dominance   Dominant Hand: Right    Extremity/Trunk Assessment   Upper Extremity Assessment Upper Extremity Assessment: Defer to OT evaluation(hx of L shoulder injury per family)    Lower Extremity Assessment Lower Extremity Assessment: Generalized weakness       Communication   Communication: (decrease phonation s/p extubation )  Cognition Arousal/Alertness: Awake/alert Behavior During Therapy: Restless;Agitated Overall Cognitive Status: Impaired/Different from baseline  General Comments: Today patient with limited phonation, following commands consistently 75% of time.       General Comments      Exercises Low Level/ICU Exercises Ankle Circles/Pumps: 20 reps Quad Sets: 10 reps Short Arc Quad: 10 reps Hip ABduction/ADduction: 10 reps Heel Slides: 10 reps Shoulder Flexion: Both Elbow Flexion: Both Shoulder Press: 10 reps   Assessment/Plan    PT Assessment Patient needs continued PT services  PT Problem List Decreased strength       PT Treatment Interventions DME instruction;Stair training;Gait  training;Functional mobility training;Therapeutic activities;Therapeutic exercise    PT Goals (Current goals can be found in the Care Plan section)  Acute Rehab PT Goals Patient Stated Goal: non stated PT Goal Formulation: With patient Time For Goal Achievement: 05/12/18 Potential to Achieve Goals: Good    Frequency Min 3X/week   Barriers to discharge Decreased caregiver support lives alone    Co-evaluation               AM-PAC PT "6 Clicks" Mobility  Outcome Measure Help needed turning from your back to your side while in a flat bed without using bedrails?: Total Help needed moving from lying on your back to sitting on the side of a flat bed without using bedrails?: Total Help needed moving to and from a bed to a chair (including a wheelchair)?: Total Help needed standing up from a chair using your arms (e.g., wheelchair or bedside chair)?: Total Help needed to walk in hospital room?: Total Help needed climbing 3-5 steps with a railing? : Total 6 Click Score: 6    End of Session Equipment Utilized During Treatment: Gait belt Activity Tolerance: Patient tolerated treatment well Patient left: in bed;with call bell/phone within reach;with bed alarm set   PT Visit Diagnosis: Unsteadiness on feet (R26.81)    Time: 5573-2202 PT Time Calculation (min) (ACUTE ONLY): 27 min   Charges:   PT Evaluation $PT Eval Moderate Complexity: 1 Mod PT Treatments $Therapeutic Exercise: 8-22 mins        Reinaldo Berber, PT, DPT Acute Rehabilitation Services Pager: [phone] Office: [phone]    Reinaldo Berber 04/28/2018, 3:49 PM

## 2018-04-29 LAB — GLUCOSE, CAPILLARY
Glucose-Capillary: 118 mg/dL — ABNORMAL HIGH (ref 70–99)
Glucose-Capillary: 146 mg/dL — ABNORMAL HIGH (ref 70–99)
Glucose-Capillary: 158 mg/dL — ABNORMAL HIGH (ref 70–99)
Glucose-Capillary: 166 mg/dL — ABNORMAL HIGH (ref 70–99)
Glucose-Capillary: 220 mg/dL — ABNORMAL HIGH (ref 70–99)
Glucose-Capillary: 263 mg/dL — ABNORMAL HIGH (ref 70–99)
Glucose-Capillary: 267 mg/dL — ABNORMAL HIGH (ref 70–99)

## 2018-04-29 MED ORDER — ACETAMINOPHEN 160 MG/5ML PO SOLN
650.0000 mg | ORAL | Status: DC | PRN
  Administered 2018-04-29 – 2018-05-01 (×2): 650 mg via ORAL
  Filled 2018-04-29 (×2): qty 20.3

## 2018-04-29 MED ORDER — PRO-STAT SUGAR FREE PO LIQD
30.0000 mL | Freq: Two times a day (BID) | ORAL | Status: DC
  Administered 2018-04-29 – 2018-05-03 (×9): 30 mL via ORAL
  Filled 2018-04-29 (×9): qty 30

## 2018-04-29 MED ORDER — OSMOLITE 1.5 CAL PO LIQD
1000.0000 mL | ORAL | Status: DC
  Administered 2018-04-29 – 2018-05-01 (×3): 1000 mL
  Filled 2018-04-29 (×7): qty 1000

## 2018-04-29 MED ORDER — CLONIDINE HCL 0.3 MG PO TABS
0.3000 mg | ORAL_TABLET | Freq: Four times a day (QID) | ORAL | Status: DC
  Administered 2018-04-29 – 2018-04-30 (×3): 0.3 mg via ORAL
  Filled 2018-04-29 (×3): qty 1

## 2018-04-29 MED ORDER — METOPROLOL SUCCINATE ER 50 MG PO TB24
50.0000 mg | ORAL_TABLET | Freq: Every day | ORAL | Status: DC
  Administered 2018-04-30: 50 mg via ORAL
  Filled 2018-04-29: qty 1

## 2018-04-29 NOTE — Progress Notes (Signed)
NAME:  Paul [name], MRN:  [id number], DOB:  1951-04-13, LOS: 8 ADMISSION DATE:  04/21/2018, CONSULTATION DATE:  1/5 REFERRING MD:  EDP, CHIEF COMPLAINT:  AMS  Brief History   68yo male with COPD on supplemental O2, CAD s/p stents 2013 presenting after being found confused and dyspneic by family. Profoundly hypoxic in ED. Intubated and found to have RUL pneumonia, strep pneumo bacteremia, and DKA.  Continue to have delirium and agitation after extubation.  Past Medical History  HTN CAD COPD on supplemental O2 OSA on CPAP Prediabetic   Significant Hospital Events   1/5 > Intubated, admit  1/11 > extubated  Consults:    Procedures:  1/5 ETT > 1/11  Significant Diagnostic Tests:  1/5 > CXR with RUL pneumonia   Micro Data:  1/5 Blood culture >> Strep Pneumo 1/5 Urine culture >> No growth (final)  1/5 resp culture >> MSSA 1/6 RVP >> + RSV  1/9 Resp culture >> No growth  1/9 Blood culture >> No growth   Antimicrobials:  1/5 vanc >> 1/5 1/5 azithro >> DC'd 1/6 1/5 ceftriaxone >> DC'd 1/8 1/8 Ancef >> DC'd 1/11  1/11 Ceftriaxone >>   Interim history/subjective:  Agitated overnight, he was given 1 dose of Haldol with no response.  Objective   Blood pressure (!) 178/91, pulse 98, temperature 97.7 F (36.5 C), temperature source Oral, resp. rate (!) 27, height 5\' 6"  (1.676 m), weight 82 kg, SpO2 93 %.        Intake/Output Summary (Last 24 hours) at 04/29/2018 1400 Last data filed at 04/29/2018 1300 Gross per 24 hour  Intake 1372.1 ml  Output 1445 ml  Net -72.9 ml   Filed Weights   04/27/18 0500 04/28/18 0500 04/29/18 0500  Weight: 84.8 kg 82.4 kg 82 kg    Examination: General: Agitated, altered, able to follow some commands. Lungs: Bilaterally, normal work of breathing. Cardiovascular: RRR, no m/r/g Abdomen:  Soft, nontender, bowel sounds positive. Extremities: 1+LE pitting edema, is intact and symmetrical. Skin: c/d/i   Resolved Hospital Problem list     DKA AKI  Assessment & Plan:   Mr. Bolin is a 68yo male with COPD on supplemental O2, CAD s/p stents placed 2013, and HTN presenting after being found confused and dyspneic by family. Profoundly hypoxic on arrival requiring intubation. Found to have RUL PNA and DKA.  Acute Hypoxic Respiratory Failure: Improving. Extubated 1/11 stable on [place]. Secondary to RUL PNA. Blood cultures with Strep Pneumonia, RVP +RSV, and Sputum cx with MSSA. - Continue Ceftriaxone for total of 2 weeks. - Wean oxygen as able   Encephalopathy: ICU Delirium.  - Precedex with clonidine protocol. - IV haldol 2-4 mg q6h prn   Dysphagia.  Failed swallow evaluation, most likely due to altered mental status.  Cortrak was placed and he was started on tube feed.  Strep Pneumo Bacteremia:  - Continue Ceftriaxone (Day 9 / 14) - Trend fever curve - Repeat blood cultures & resp cultures NGTD  Mild Hypernatremia: Resolved.  Hyperglycemia: Improved,  - Moderate SSI q4h - CBG checks q4  Constipation: Large BM 1/11 after lactulose  - BID Senokot  COPD: On home O2 - Continue brovana & pulmicort   Hx HTN: Blood pressure elevated this morning. -restart home lopressor 25 BID -Home amlodipine can be restarted if needed. - IV prn hydralazine  CAD s/p Stent Placement 2013 - plavix 75 mg qd  Best practice:  Diet: NPO Pain/Anxiety/Delirium protocol (if indicated): Precedex, PRN haldol VAP protocol (  if indicated): N/A DVT prophylaxis: lovenox GI prophylaxis: Pepcid Glucose control: moderate SSI Mobility: bedrest Code Status: full Family Communication:  Family updated at bedside 1/13 Disposition: ICU   Critical care time:      Lorella Nimrod, M.D. - PGY3 Pager: [phone] 04/29/2018, 2:00 PM

## 2018-04-29 NOTE — Progress Notes (Addendum)
04/29/2018 0804 hrs: RN attempts to place NGT per Dr. Latina Craver request. Failed attempts X 2 to place NGT. Will consult with SLP regarding possibility of cortrak.

## 2018-04-29 NOTE — Evaluation (Signed)
Occupational Therapy Evaluation Patient Details Name: Paul [name] MRN: [id number] DOB: 09-09-1950 Today's Date: 04/29/2018    History of Present Illness 68 year old man with COPD on home oxygen admitted 1/5 with right upper lobe community-acquired pneumonia requiring mechanical ventilation. Extubated 1/11. PMH includes but not limited to: OSA, Obesity, COPD, CAD, Gout, HTN, Chronic exertional dyspnea, C spine surgery 2012.    Clinical Impression   This 68 yo male admitted with above presents to acute OT with decreased safety awareness, decreased awareness of deficits, impulsivity, decreased sitting and standing balance all affecting his safety and independence with basic ADLs  (currently Max A +2 to Mod A for all basic ADLs with PLOF being independent). He will benefit from acute OT with follow up OT on CIR to work back towards PLOF.    Follow Up Recommendations  CIR;Supervision/Assistance - 24 hour    Equipment Recommendations  Other (comment)(TBD at next venue)       Precautions / Restrictions Precautions Precautions: Fall Restrictions Weight Bearing Restrictions: No      Mobility Bed Mobility Overal bed mobility: Needs Assistance Bed Mobility: Supine to Sit;Sit to Supine     Supine to sit: Mod assist;+2 for physical assistance Sit to supine: Mod assist;+2 for physical assistance   General bed mobility comments: A for legs and trunk with support throughout due to patient unable or too restless to keep static sitting balance  Transfers Overall transfer level: Needs assistance Equipment used: 2 person hand held assist             General transfer comment: with use of gait belt--partial stand Mod A +2 with Max A +2 for shifting hips to left x 5 to get up in bed    Balance Overall balance assessment: Needs assistance Sitting-balance support: Single extremity supported;Feet supported Sitting balance-Leahy Scale: Poor Sitting balance - Comments: able to keep balance  statically momentarily but then would loose balance v. shift weight and lose balance   Standing balance support: Bilateral upper extremity supported Standing balance-Leahy Scale: Zero                             ADL either performed or assessed with clinical judgement   ADL Overall ADL's : Needs assistance/impaired Eating/Feeding: NPO   Grooming: Moderate assistance Grooming Details (indicate cue type and reason): supported sitting Upper Body Bathing: Maximal assistance Upper Body Bathing Details (indicate cue type and reason): supported sitting Lower Body Bathing: Total assistance Lower Body Bathing Details (indicate cue type and reason): Mod A+2 partial sit>stand Upper Body Dressing : Total assistance Upper Body Dressing Details (indicate cue type and reason): supported sitting Lower Body Dressing: Total assistance Lower Body Dressing Details (indicate cue type and reason): Mod A+2 partial sit>stand Toilet Transfer: Maximal assistance;+2 for physical assistance Toilet Transfer Details (indicate cue type and reason): partial stand>shift hips up in bed x5 Toileting- Clothing Manipulation and Hygiene: Total assistance Toileting - Clothing Manipulation Details (indicate cue type and reason): Mod A+2 partial sit>stand             Vision Patient Visual Report: No change from baseline              Pertinent Vitals/Pain Pain Assessment: Faces Faces Pain Scale: No hurt     Hand Dominance Right   Extremity/Trunk Assessment Upper Extremity Assessment Upper Extremity Assessment: Generalized weakness           Communication Communication Communication: Other (comment)(low voice quality)  Cognition Arousal/Alertness: Awake/alert Behavior During Therapy: Restless;Impulsive Overall Cognitive Status: Impaired/Different from baseline Area of Impairment: Orientation;Following commands;Safety/judgement;Problem solving                 Orientation Level:  Disoriented to;Place(no oriented as far as hospital or month; but knows he lives in [place] and year is 2020)     Following Commands: Follows one step commands inconsistently;Follows one step commands with increased time Safety/Judgement: Decreased awareness of safety;Decreased awareness of deficits   Problem Solving: Decreased initiation;Requires verbal cues;Requires tactile cues General Comments: He was all over the place while seated EOB--difficulty maintaining static balance              Home Living Family/patient expects to be discharged to:: Inpatient rehab                                        Prior Functioning/Environment Level of Independence: Independent        Comments: lives at home alone rides motorcylces, per family not the best diet or activity level and has had some leg weakness as of late. indepenent with ADLs        OT Problem List: Impaired balance (sitting and/or standing);Decreased strength;Decreased coordination;Decreased cognition;Decreased safety awareness;Decreased knowledge of use of DME or AE      OT Treatment/Interventions: Self-care/ADL training;Balance training;DME and/or AE instruction;Patient/family education;Cognitive remediation/compensation;Therapeutic activities    OT Goals(Current goals can be found in the care plan section) Acute Rehab OT Goals Patient Stated Goal: wants water but currently NPO OT Goal Formulation: Patient unable to participate in goal setting Time For Goal Achievement: 05/13/18 Potential to Achieve Goals: Good  OT Frequency: Min 2X/week           Co-evaluation PT/OT/SLP Co-Evaluation/Treatment: Yes Reason for Co-Treatment: Complexity of the patient's impairments (multi-system involvement);Necessary to address cognition/behavior during functional activity;For patient/therapist safety   OT goals addressed during session: ADL's and self-care;Strengthening/ROM      AM-PAC OT "6 Clicks" Daily  Activity     Outcome Measure Help from another person eating meals?: Total Help from another person taking care of personal grooming?: A Lot Help from another person toileting, which includes using toliet, bedpan, or urinal?: Total Help from another person bathing (including washing, rinsing, drying)?: A Lot Help from another person to put on and taking off regular upper body clothing?: A Lot Help from another person to put on and taking off regular lower body clothing?: Total 6 Click Score: 9   End of Session Equipment Utilized During Treatment: Gait belt  Activity Tolerance: Patient tolerated treatment well Patient left: in bed;with call bell/phone within reach;with bed alarm set;with restraints reapplied(posey and Bil mitts)  OT Visit Diagnosis: Unsteadiness on feet (R26.81);Other abnormalities of gait and mobility (R26.89);Muscle weakness (generalized) (M62.81);Other symptoms and signs involving cognitive function                Time: 5361-4431 OT Time Calculation (min): 20 min Charges:  OT General Charges $OT Visit: 1 Visit OT Evaluation $OT Eval Moderate Complexity: 1 Mod  [name] Circle, OTR/L Acute [employer] Pager [phone] Office [phone]     Almon Register 04/29/2018, 9:06 AM

## 2018-04-29 NOTE — Progress Notes (Signed)
eLink Physician-Brief Progress Note Patient Name: Paul [name] DOB: January 24, 1951 MRN: [id number]   Date of Service  04/29/2018  HPI/Events of Note  Agitation - Request for soft waist belt restraint.   eICU Interventions  Will order soft waist belt restraint.      Intervention Category Major Interventions: Delirium, psychosis, severe agitation - evaluation and management  Sommer,Steven Eugene 04/29/2018, 12:21 AM

## 2018-04-29 NOTE — Progress Notes (Signed)
Nutrition Follow-up  DOCUMENTATION CODES:   Not applicable  INTERVENTION:   Tube Feeding:  Osmolite 1.5 at 55 ml/hr Pro-Stat 30 mL BID Provides 115 g of protein, 2180 kcals, 1003 mL of free water  NUTRITION DIAGNOSIS:   Inadequate oral intake related to acute illness as evidenced by NPO status.  Being addressed via TF   GOAL:   Patient will meet greater than or equal to 90% of their needs  Progressed  MONITOR:   Vent status, Labs, Weight trends  REASON FOR ASSESSMENT:   Ventilator    ASSESSMENT:   68 yo male admitted with acute respiratory failure related to RUL pneumonia requiring intubation on admission, AKI, DKA. PMH includes COPD on home oxygen, CAD, HTN, DM  1/05 Intubated, Admit 1/06 TF initiated 1/11 Extubated, TF discontinued, NPO 1/13 Cortrak placed  Pt remains encephalopathic, confused, agitated at times, precedex Pt working with PT/OT, CIR recommended  SLP following, severe aspiration risk; plan for MBS/FEES at some point  Net + 10 L per I/O flow sheet. Utilizing 78 kg as EDW  Labs: sodium 146, CBGs 118-233, Creatinine wdl Meds: precedex, ss novolog   Diet Order:   Diet Order            Diet NPO time specified  Diet effective now              EDUCATION NEEDS:   Not appropriate for education at this time  Skin:  Skin Assessment: Reviewed RN Assessment  Last BM:  1/10  Height:   Ht Readings from Last 1 Encounters:  04/21/18 5\' 6"  (1.676 m)    Weight:   Wt Readings from Last 1 Encounters:  04/29/18 82 kg    Ideal Body Weight:  64.5 kg  BMI:  Body mass index is 29.18 kg/m.  Estimated Nutritional Needs:   Kcal:  6256-3893 kcals   Protein:  110-120 g  Fluid:  >/= 2 L   Kerman Passey MS, RD, LDN, CNSC [phone] Pager  [phone] Weekend/On-Call Pager

## 2018-04-29 NOTE — Procedures (Signed)
Cortrak  Person Inserting Tube:  Idelia Salm, RD Tube Type:  Cortrak - 43 inches Tube Location:  Right nare Initial Placement:  Postpyloric Secured by: Bridle Technique Used to Measure Tube Placement:  Documented cm marking at nare/ corner of mouth Cortrak Secured At:  88 cm    Cortrak Tube Team Note:  Consult received to place a Cortrak feeding tube.   No x-ray is required. RN may begin using tube.   If the tube becomes dislodged please keep the tube and contact the Cortrak team at [email or web address] (password [email or web address]) for replacement.  If after hours and replacement cannot be delayed, place a NG tube and confirm placement with an abdominal x-ray.    Gaynell Face, MS, RD, LDN Inpatient Clinical Dietitian Pager: [phone] Weekend/After Hours: [phone]

## 2018-04-29 NOTE — Plan of Care (Signed)
  Problem: Education: Goal: Knowledge of General Education information will improve Description Including pain rating scale, medication(s)/side effects and non-pharmacologic comfort measures Outcome: Progressing   Problem: Health Behavior/Discharge Planning: Goal: Ability to manage health-related needs will improve Outcome: Progressing   Problem: Clinical Measurements: Goal: Ability to maintain clinical measurements within normal limits will improve Outcome: Progressing Goal: Will remain free from infection Outcome: Progressing Goal: Diagnostic test results will improve Outcome: Progressing Goal: Respiratory complications will improve Outcome: Progressing Goal: Cardiovascular complication will be avoided Outcome: Progressing   Problem: Activity: Goal: Risk for activity intolerance will decrease Outcome: Progressing   Problem: Nutrition: Goal: Adequate nutrition will be maintained Outcome: Progressing   Problem: Coping: Goal: Level of anxiety will decrease Outcome: Progressing   Problem: Elimination: Goal: Will not experience complications related to bowel motility Outcome: Progressing Goal: Will not experience complications related to urinary retention Outcome: Progressing   Problem: Pain Managment: Goal: General experience of comfort will improve Outcome: Progressing   Problem: Safety: Goal: Ability to remain free from injury will improve Outcome: Progressing   Problem: Skin Integrity: Goal: Risk for impaired skin integrity will decrease Outcome: Progressing   Problem: Activity: Goal: Ability to tolerate increased activity will improve Outcome: Progressing   Problem: Respiratory: Goal: Ability to maintain a clear airway and adequate ventilation will improve Outcome: Progressing   Problem: Role Relationship: Goal: Method of communication will improve Outcome: Progressing   Problem: Education: Goal: Ability to describe self-care measures that may  prevent or decrease complications (Diabetes Survival Skills Education) will improve Outcome: Progressing Goal: Individualized Educational Video(s) Outcome: Progressing   Problem: Cardiac: Goal: Ability to maintain an adequate cardiac output will improve Outcome: Progressing   Problem: Health Behavior/Discharge Planning: Goal: Ability to identify and utilize available resources and services will improve Outcome: Progressing Goal: Ability to manage health-related needs will improve Outcome: Progressing   Problem: Fluid Volume: Goal: Ability to achieve a balanced intake and output will improve Outcome: Progressing   Problem: Metabolic: Goal: Ability to maintain appropriate glucose levels will improve Outcome: Progressing   Problem: Nutritional: Goal: Maintenance of adequate nutrition will improve Outcome: Progressing Goal: Maintenance of adequate weight for body size and type will improve Outcome: Progressing   Problem: Respiratory: Goal: Will regain and/or maintain adequate ventilation Outcome: Progressing   Problem: Urinary Elimination: Goal: Ability to achieve and maintain adequate renal perfusion and functioning will improve Outcome: Progressing    Remains delirious and on Precedex drip.

## 2018-04-29 NOTE — Progress Notes (Signed)
SLP Cancellation Note  Patient Details Name: Paul [name] MRN: [id number] DOB: 06/14/1950   Cancelled treatment:       Reason Eval/Treat Not Completed: Patient not medically ready, still severe delirium, Pt getting cortrak today. Will f/u for readiness.    Gianelle Mccaul, Katherene Ponto 04/29/2018, 2:37 PM

## 2018-04-29 NOTE — Progress Notes (Signed)
Physical Therapy Treatment Patient Details Name: Paul [name] MRN: [id number] DOB: 1951/02/09 Today's Date: 04/29/2018    History of Present Illness 68 year old man with COPD on home oxygen admitted 1/5 with right upper lobe community-acquired pneumonia requiring mechanical ventilation. Extubated 1/11. PMH includes but not limited to: OSA, Obesity, COPD, CAD, Gout, HTN, Chronic exertional dyspnea, C spine surgery 2012.     PT Comments    Patient progressing slowly towards PT goals. Tolerated multiple standing bouts from EOB with assist of 2. Pt's sitting balance unpredictable as pt falling in all directions spontaneously without warning. Perseverating on wanting to drink water despite being told multiple times he cannot. VSS throughout session. Pt with cognitive deficits- restless, decreased awareness of safety/deficits and confused limiting ability to get in chair per nurse.  Will follow.     Follow Up Recommendations  CIR     Equipment Recommendations  None recommended by PT    Recommendations for Other Services       Precautions / Restrictions Precautions Precautions: Fall Precaution Comments: posey belt restraints and bil mitts Restrictions Weight Bearing Restrictions: No    Mobility  Bed Mobility Overal bed mobility: Needs Assistance Bed Mobility: Supine to Sit;Sit to Supine     Supine to sit: Mod assist;+2 for physical assistance Sit to supine: Mod assist;+2 for physical assistance   General bed mobility comments: A for legs and trunk with support throughout due to patient unable or too restless to keep static sitting balance.  Transfers Overall transfer level: Needs assistance Equipment used: 2 person hand held assist Transfers: Sit to/from Stand Sit to Stand: Mod assist;+2 physical assistance         General transfer comment: with use of gait belt--partial stand Mod A +2 with Max A +2 for shifting hips to left x 5 to get up in bed, performed x4 times.    Ambulation/Gait             General Gait Details: NOt able.   Stairs             Wheelchair Mobility    Modified Rankin (Stroke Patients Only)       Balance Overall balance assessment: Needs assistance Sitting-balance support: Single extremity supported;Feet supported Sitting balance-Leahy Scale: Poor Sitting balance - Comments: able to keep balance statically momentarily but then would loose balance v. shift weight and lose balance, no balance reactions, unpredictable.    Standing balance support: Bilateral upper extremity supported;During functional activity Standing balance-Leahy Scale: Zero Standing balance comment: external support to maintain balance. Not able to stand fully upright,.                             Cognition Arousal/Alertness: Awake/alert Behavior During Therapy: Restless;Impulsive Overall Cognitive Status: Impaired/Different from baseline Area of Impairment: Orientation;Following commands;Safety/judgement;Problem solving                 Orientation Level: Disoriented to;Place;Situation(knows it is 2020 )     Following Commands: Follows one step commands inconsistently;Follows one step commands with increased time Safety/Judgement: Decreased awareness of safety;Decreased awareness of deficits   Problem Solving: Decreased initiation;Requires verbal cues;Requires tactile cues General Comments: He was all over the place while seated EOB--difficulty maintaining static balance. No awareness of safety/deficits. Perseverating on wanting to drink water despite beingtold multiple times he is now allowed.       Exercises      General Comments  Pertinent Vitals/Pain Pain Assessment: Faces Faces Pain Scale: No hurt    Home Living Family/patient expects to be discharged to:: Inpatient rehab                    Prior Function Level of Independence: Independent      Comments: lives at home alone rides  motorcylces, per family not the best diet or activity level and has had some leg weakness as of late. indepenent with ADLs   PT Goals (current goals can now be found in the care plan section) Acute Rehab PT Goals Patient Stated Goal: wants water but currently NPO Progress towards PT goals: Progressing toward goals    Frequency    Min 3X/week      PT Plan Current plan remains appropriate    Co-evaluation PT/OT/SLP Co-Evaluation/Treatment: Yes Reason for Co-Treatment: To address functional/ADL transfers;Necessary to address cognition/behavior during functional activity;For patient/therapist safety;Complexity of the patient's impairments (multi-system involvement) PT goals addressed during session: Mobility/safety with mobility OT goals addressed during session: ADL's and self-care;Strengthening/ROM      AM-PAC PT "6 Clicks" Mobility   Outcome Measure  Help needed turning from your back to your side while in a flat bed without using bedrails?: A Lot Help needed moving from lying on your back to sitting on the side of a flat bed without using bedrails?: A Lot Help needed moving to and from a bed to a chair (including a wheelchair)?: Total Help needed standing up from a chair using your arms (e.g., wheelchair or bedside chair)?: Total Help needed to walk in hospital room?: Total Help needed climbing 3-5 steps with a railing? : Total 6 Click Score: 8    End of Session Equipment Utilized During Treatment: Gait belt Activity Tolerance: Patient tolerated treatment well Patient left: in bed;with call bell/phone within reach;with bed alarm set Nurse Communication: Mobility status PT Visit Diagnosis: Unsteadiness on feet (R26.81)     Time: 0938-1829 PT Time Calculation (min) (ACUTE ONLY): 17 min  Charges:  $Neuromuscular Re-education: 8-22 mins                     Wray Kearns, PT, DPT Acute Rehabilitation Services Pager [phone] Office  [place] 04/29/2018, 10:31 AM

## 2018-04-30 DIAGNOSIS — L8990 Pressure ulcer of unspecified site, unspecified stage: Secondary | ICD-10-CM

## 2018-04-30 DIAGNOSIS — I25119 Atherosclerotic heart disease of native coronary artery with unspecified angina pectoris: Secondary | ICD-10-CM

## 2018-04-30 DIAGNOSIS — R7881 Bacteremia: Secondary | ICD-10-CM

## 2018-04-30 DIAGNOSIS — J449 Chronic obstructive pulmonary disease, unspecified: Secondary | ICD-10-CM

## 2018-04-30 DIAGNOSIS — I2510 Atherosclerotic heart disease of native coronary artery without angina pectoris: Secondary | ICD-10-CM

## 2018-04-30 DIAGNOSIS — D62 Acute posthemorrhagic anemia: Secondary | ICD-10-CM

## 2018-04-30 DIAGNOSIS — I10 Essential (primary) hypertension: Secondary | ICD-10-CM

## 2018-04-30 DIAGNOSIS — Z720 Tobacco use: Secondary | ICD-10-CM

## 2018-04-30 LAB — CULTURE, BLOOD (ROUTINE X 2)
Culture: NO GROWTH
Culture: NO GROWTH

## 2018-04-30 LAB — CBC
HCT: 34 % — ABNORMAL LOW (ref 39.0–52.0)
Hemoglobin: 10.6 g/dL — ABNORMAL LOW (ref 13.0–17.0)
MCH: 30 pg (ref 26.0–34.0)
MCHC: 31.2 g/dL (ref 30.0–36.0)
MCV: 96.3 fL (ref 80.0–100.0)
Platelets: 249 10*3/uL (ref 150–400)
RBC: 3.53 MIL/uL — ABNORMAL LOW (ref 4.22–5.81)
RDW: 13.7 % (ref 11.5–15.5)
WBC: 6 10*3/uL (ref 4.0–10.5)
nRBC: 0 % (ref 0.0–0.2)

## 2018-04-30 LAB — BASIC METABOLIC PANEL
Anion gap: 6 (ref 5–15)
BUN: 25 mg/dL — ABNORMAL HIGH (ref 8–23)
CO2: 24 mmol/L (ref 22–32)
Calcium: 7.8 mg/dL — ABNORMAL LOW (ref 8.9–10.3)
Chloride: 114 mmol/L — ABNORMAL HIGH (ref 98–111)
Creatinine, Ser: 0.87 mg/dL (ref 0.61–1.24)
GFR calc Af Amer: >60 mL/min (ref >60)
GFR calc non Af Amer: >60 mL/min (ref >60)
Glucose, Bld: 313 mg/dL — ABNORMAL HIGH (ref 70–99)
Potassium: 3.5 mmol/L (ref 3.5–5.1)
Sodium: 144 mmol/L (ref 135–145)

## 2018-04-30 LAB — PHOSPHORUS: Phosphorus: 3.3 mg/dL (ref 2.5–4.6)

## 2018-04-30 LAB — GLUCOSE, CAPILLARY
Glucose-Capillary: 246 mg/dL — ABNORMAL HIGH (ref 70–99)
Glucose-Capillary: 273 mg/dL — ABNORMAL HIGH (ref 70–99)
Glucose-Capillary: 283 mg/dL — ABNORMAL HIGH (ref 70–99)
Glucose-Capillary: 290 mg/dL — ABNORMAL HIGH (ref 70–99)
Glucose-Capillary: 296 mg/dL — ABNORMAL HIGH (ref 70–99)
Glucose-Capillary: 303 mg/dL — ABNORMAL HIGH (ref 70–99)

## 2018-04-30 LAB — MAGNESIUM: Magnesium: 1.9 mg/dL (ref 1.7–2.4)

## 2018-04-30 MED ORDER — CLONIDINE HCL 0.1 MG PO TABS
0.1000 mg | ORAL_TABLET | Freq: Four times a day (QID) | ORAL | Status: AC
  Administered 2018-05-01 – 2018-05-02 (×4): 0.1 mg via ORAL
  Filled 2018-04-30 (×4): qty 1

## 2018-04-30 MED ORDER — CLONIDINE HCL 0.2 MG PO TABS
0.2000 mg | ORAL_TABLET | Freq: Four times a day (QID) | ORAL | Status: AC
  Administered 2018-04-30 – 2018-05-01 (×4): 0.2 mg via ORAL
  Filled 2018-04-30 (×4): qty 1

## 2018-04-30 MED ORDER — CLONIDINE HCL 0.1 MG PO TABS
0.1000 mg | ORAL_TABLET | Freq: Every day | ORAL | Status: AC
  Administered 2018-05-03: 0.1 mg via ORAL
  Filled 2018-04-30: qty 1

## 2018-04-30 MED ORDER — POTASSIUM CHLORIDE 20 MEQ/15ML (10%) PO SOLN
20.0000 meq | ORAL | Status: AC
  Administered 2018-04-30 (×2): 20 meq
  Filled 2018-04-30 (×2): qty 15

## 2018-04-30 MED ORDER — INSULIN DETEMIR 100 UNIT/ML ~~LOC~~ SOLN
10.0000 [IU] | Freq: Two times a day (BID) | SUBCUTANEOUS | Status: DC
  Administered 2018-04-30 – 2018-05-03 (×7): 10 [IU] via SUBCUTANEOUS
  Filled 2018-04-30 (×8): qty 0.1

## 2018-04-30 MED ORDER — CLONIDINE HCL 0.1 MG PO TABS
0.1000 mg | ORAL_TABLET | Freq: Two times a day (BID) | ORAL | Status: AC
  Administered 2018-05-02 (×2): 0.1 mg via ORAL
  Filled 2018-04-30 (×2): qty 1

## 2018-04-30 MED ORDER — INSULIN DETEMIR 100 UNIT/ML ~~LOC~~ SOLN: 10.0000 [IU] | Freq: Every day | SUBCUTANEOUS | Status: DC

## 2018-04-30 NOTE — Progress Notes (Signed)
  Speech Language Pathology Treatment: Dysphagia  Patient Details Name: Nolin Grell MRN: [id number] DOB: [birth year] Today's Date: 04/30/2018 Time: 1937-9024 SLP Time Calculation (min) (ACUTE ONLY): 8 min  Assessment / Plan / Recommendation Clinical Impression  Pt demonstrates ongoing severe dysphonia and impaired cough mechanism though he was attentive to ice and quickly initiated a relatively good swallow response subjectively. Oral mucosa is very dry due to ongoing NPO status. Recommend Pt consume a few ice chips every few hours with RN to improve oral hygiene. Will follow for readiness for objective testing.   HPI HPI: 68 year old man with COPD on home oxygen admitted 1/5 with right upper lobe community-acquired pneumonia requiring mechanical ventilation. Intubated until 1/11.       SLP Plan  Continue with current plan of care       Recommendations  Diet recommendations: NPO(ice chips ok)                Plan: Continue with current plan of care       GO               Herbie Baltimore, MA [place] Pager [phone] Office [phone]  Lynann Beaver 04/30/2018, 2:57 PM

## 2018-04-30 NOTE — Progress Notes (Signed)
Inpatient Rehabilitation-Admissions Coordinator    Met with patient at the bedside to discuss team's recommendation for inpatient rehabilitation. Pt gave permission for [facility] to speak with his daughter regarding his care. AC spoke with her via phone and discussed CIR program, expectations while in CIR, expected length of stay, and anticipated functional level at DC.  Pt's daughter appreciative of the information and is hopeful to work out a plan for caregiver support. AC plans to follow up with her tomorrow to see if support available.   Jhonnie Garner, OTR/L  Rehab Admissions Coordinator  [phone] 04/30/2018 5:07 PM

## 2018-04-30 NOTE — Progress Notes (Addendum)
NAME:  Paul [name], MRN:  [id number], DOB:  Jan 25, 1951, LOS: 9 ADMISSION DATE:  04/21/2018, CONSULTATION DATE:  1/5 REFERRING MD:  EDP, CHIEF COMPLAINT:  AMS  Brief History   68yo male with COPD on supplemental O2, CAD s/p stents 2013 presenting after being found confused and dyspneic by family. Profoundly hypoxic in ED. Intubated and found to have RUL pneumonia, strep pneumo bacteremia, and DKA.  Continue to have delirium and agitation after extubation.  Past Medical History  HTN CAD COPD on supplemental O2 OSA on CPAP Prediabetic   Significant Hospital Events   1/5 > Intubated, admit  1/11 > extubated  Consults:    Procedures:  1/5 ETT > 1/11  Significant Diagnostic Tests:  1/5 > CXR with RUL pneumonia   Micro Data:  1/5 Blood culture >> Strep Pneumo 1/5 Urine culture >> No growth (final)  1/5 resp culture >> MSSA 1/6 RVP >> + RSV  1/9 Resp culture >> No growth  1/9 Blood culture >> No growth   Antimicrobials:  1/5 vanc >> 1/5 1/5 azithro >> DC'd 1/6 1/5 ceftriaxone >> DC'd 1/8 1/8 Ancef >> DC'd 1/11  1/11 Ceftriaxone >>   Interim history/subjective:  Per nursing staff patient was mostly awake last night with much controlled agitation.  Precedex was stopped around 11 PM last night.  Responding very well to clonidine.  Had 1 dose of Haldol around midnight.  He went to sleep around 6 AM.  Objective   Blood pressure (!) 144/77, pulse (!) 106, temperature 98.6 F (37 C), temperature source Oral, resp. rate (!) 22, height 5\' 6"  (1.676 m), weight 80.9 kg, SpO2 96 %.        Intake/Output Summary (Last 24 hours) at 04/30/2018 0653 Last data filed at 04/30/2018 0600 Gross per 24 hour  Intake 1546.7 ml  Output 1260 ml  Net 286.7 ml   Filed Weights   04/28/18 0500 04/29/18 0500 04/30/18 0500  Weight: 82.4 kg 82 kg 80.9 kg    Examination: General:  Chronically ill-appearing gentleman, drowsy but easily arousable, appears comfortable, in no acute  distress. Lungs: Normal work of breathing with bilateral scattered wheeze. Cardiovascular: RRR, no m/r/g Abdomen:  Soft, nontender, bowel sounds positive. Neuro: Appears sleepy, easily arousable, following simple commands, oriented to self only, strength and sensations intact and symmetrical. Extremities:  No LE edema, pulses intact and symmetrical. Skin: c/d/i   Resolved Hospital Problem list   DKA AKI  Assessment & Plan:   Paul [name] is a 68yo male with COPD on supplemental O2, CAD s/p stents placed 2013, and HTN presenting after being found confused and dyspneic by family. Profoundly hypoxic on arrival requiring intubation. Found to have RUL PNA and DKA.  Acute Hypoxic Respiratory Failure: Improving. Extubated 1/11 stable on [place]. Secondary to RUL PNA. Blood cultures with Strep Pneumonia, RVP +RSV, and Sputum cx with MSSA. - Continue Ceftriaxone for total of 2 weeks, 10/14 days, will be stopped on January 19. - Wean oxygen as able, saturating well on 4 L-decrease to 3 L. -PT is recommending CIR.  Encephalopathy: ICU Delirium, seems improving. -Continue clonidine protocol. - IV haldol 2-4 mg q6h prn  -Patient is off the Precedex infusion-can be transferred to stepdown.  Dysphagia.  Failed swallow evaluation, most likely due to altered mental status.  Cortrak was placed and he was started on tube feed. -Continue tube feeds. -Follow with speech recommendations.  Strep Pneumo Bacteremia: Remained afebrile with no leukocytosis. - Continue Ceftriaxone (Day 10 / 14) -  Trend fever curve - Repeat blood cultures & resp cultures NGTD -Continue PT and OT.  Mild Hypernatremia: Resolved.  Hyperglycemia: Blood sugar elevated with the start of tube feeds. -Add Levemir 10 units BID, we will titrate accordingly. - Moderate SSI q4h - CBG checks q4  Constipation: Large BM 1/11 after lactulose  - BID Senokot  COPD: On home O2 - Continue brovana & pulmicort   Hx HTN: Normotensive this  morning. -Continue home Toprol 50 mg daily. -Home amlodipine can be restarted if needed. - IV prn hydralazine  CAD s/p Stent Placement 2013 - plavix 75 mg qd  Best practice:  Diet: Tube feed. Pain/Anxiety/Delirium protocol (if indicated): Precedex, clonidine, PRN haldol VAP protocol (if indicated): N/A DVT prophylaxis: lovenox GI prophylaxis: Pepcid Glucose control: moderate SSI Mobility: bedrest Code Status: full Family Communication:   Disposition: ICU   Lorella Nimrod, M.D. - PGY3 Pager: [phone] 04/30/2018, 6:53 AM    Attending Section:  More calm.  Transitioned off precedex.  BP (!) 174/82   Pulse 91   Temp 98.6 F (37 C) (Axillary)   Resp (!) 29   Ht 5\' 6"  (1.676 m)   Wt 80.9 kg   SpO2 97%   BMI 28.79 kg/m   Alert, follows commands, still mildly confused.  HR regular.  No wheeze.  abd soft.  1+ edema.  Hb 10.6, Na 144, Creatinine 0.87  CBG 313  A/p  Pneumococcal PNA with bacteremia. - continue ABX  Dysphagia. - f/u with speech therapy  Deconditioning - pt recommending CIR  Acute metabolic encephalopathy. - clonidine protocol to maintain off precedex  Hyperglycemia. - added levemir  Transfer to progressive care  To triad 1/15 and PCCM off  Chesley Mires, MD [place] 04/30/2018, 12:15 PM

## 2018-04-30 NOTE — Progress Notes (Signed)
[facility] ADULT ICU REPLACEMENT PROTOCOL FOR AM LAB REPLACEMENT ONLY  The patient does apply for the [facility] Adult ICU Electrolyte Replacment Protocol based on the criteria listed below:   1. Is GFR >/= 40 ml/min? Yes.    Patient's GFR today is >60 2. Is urine output >/= 0.5 ml/kg/hr for the last 6 hours? Yes.   Patient's UOP is 1 ml/kg/hr 3. Is BUN < 60 mg/dL? Yes.    Patient's BUN today is 25 4. Abnormal electrolyte(s): k-3.5 5. Ordered repletion with: per protocol 6. If a panic level lab has been reported, has the CCM MD in charge been notified? Yes.  .   Physician:  Dr. Terrill Mohr, Philis [place] 04/30/2018 6:16 AM

## 2018-04-30 NOTE — Consult Note (Signed)
Physical Medicine and Rehabilitation Consult Reason for Consult:  Decreased functional mobility Referring Physician: critical care   HPI: Paul [name] is a 68 y.o.right handed male  With history of CAD with angioplasty, COPD/tobacco abuse., hypertension. Per chart review, patient lives alone reportedly independent prior to admission. Present 04/21/2018 with increasing shortness of breath and altered mental status. Chest x-ray indicated of right upper lobe pneumonia and profound hypoxemia. Patient did require intubation. Hospital course complicated by strep pneumo bacteremia. Patient was extubated 04/27/2018. Currently remains on ceftriaxone 2 weeks. Patient did require Precedex due to delirium agitation. Subcutaneous Lovenox for DVT prophylaxis. Patient is NPO with alternative means of nutritional support. Therapy evaluations completed. M.D. has requested physical medicine rehabilitation consult.   Review of Systems  Unable to perform ROS: Acuity of condition   Past Medical History:  Diagnosis Date  . CAD S/P percutaneous coronary angioplasty -- D1, Xience Xpedition DES 2.5 mm x 33 mm 11/24/2011   a) Mild-to-moderate 30-40% lesions in the RCA, LAD and Circumflex. b) CULPRIT LESION: long tubular 70-80% lesion in D1 with FFR of 0.7 --> PCI w/ Xience Xpedition DES 2.5 mm x 30 mm (2.65 MM); c) Lexiscan Myoview 11/2013: No Ischemia or Infarct (Inferior Gut Attenuation) EF 63%.  Marland Kitchen COPD (chronic obstructive pulmonary disease) ([place])    "don't have full case of it; I'm right there at it"  . Essential hypertension 11/24/2011  . Exertional dyspnea, chronic   . Gout   . History of Unstable angina 11/24/2011   Referred for cardiac catheterization  . Hyperlipidemia with target LDL less than 70 11/24/2011  . Obesity (BMI 30.0-34.9)   . OSA (obstructive sleep apnea), uses oxygen at home did not tolerate cpap 11/24/2011   Past Surgical History:  Procedure Laterality Date  . CERVICAL SPINE SURGERY  2012  .  CORONARY ANGIOPLASTY WITH STENT PLACEMENT  11/23/2011   "1; first one"  . LEFT HEART CATHETERIZATION WITH CORONARY ANGIOGRAM N/A 11/23/2011   Procedure: LEFT HEART CATHETERIZATION WITH CORONARY ANGIOGRAM;  Surgeon: Leonie Man, MD;  Location: [facility] CATH LAB;  Service: Cardiovascular;  Laterality: N/A;   Family History  Problem Relation Age of Onset  . Heart disease Sister   . Heart attack Brother   . Emphysema Father        smoked  . Aneurysm Father    Social History:  reports that he has been smoking cigarettes. He has a 40.00 pack-year smoking history. He has never used smokeless tobacco. He reports current alcohol use. He reports that he does not use drugs. Allergies:  Allergies  Allergen Reactions  . Codeine Nausea And Vomiting  . Duloxetine Hcl Other (See Comments)    Took at night and still felt very lethargic the following day.  (malaise)   Medications Prior to Admission  Medication Sig Dispense Refill  . amLODipine (NORVASC) 5 MG tablet Take 5 mg by mouth daily.    . colchicine (COLCRYS) 0.6 MG tablet Take two tabs by mouth at once then a third tablet one hour later. (Patient taking differently: Take 0.6 mg by mouth daily. Take two tabs by mouth at once then a third tablet one hour later.) 3 tablet 0  . DULoxetine (CYMBALTA) 30 MG capsule Take 30 mg by mouth daily.  3  . esomeprazole (NEXIUM) 20 MG capsule Take 20 mg by mouth daily at 12 noon.    . Fluticasone-Salmeterol (ADVAIR DISKUS) 250-50 MCG/DOSE AEPB Inhale 1 puff into the lungs 2 (two) times  daily.    . gabapentin (NEURONTIN) 300 MG capsule Take 300 mg by mouth at bedtime.  1  . ibuprofen (ADVIL,MOTRIN) 200 MG tablet Take 200 mg by mouth every 6 (six) hours as needed for mild pain.    . metFORMIN (GLUCOPHAGE-XR) 500 MG 24 hr tablet Take 500 mg by mouth daily with supper.  3  . metoprolol succinate (TOPROL-XL) 25 MG 24 hr tablet Take 50 mg by mouth daily.  12  . tiotropium (SPIRIVA HANDIHALER) 18 MCG inhalation capsule  Place 18 mcg into inhaler and inhale daily.    Marland Kitchen zolpidem (AMBIEN) 10 MG tablet Take 10 mg by mouth at bedtime as needed for sleep.     . nitroGLYCERIN (NITROSTAT) 0.4 MG SL tablet Place 1 tablet (0.4 mg total) under the tongue every 5 (five) minutes as needed for chest pain. (Patient not taking: Reported on 04/22/2018) 25 tablet 4    Home: [place] expects to be discharged to:: Inpatient rehab  Functional History: Prior Function Level of Independence: Independent Comments: lives at home alone rides motorcylces, per family not the best diet or activity level and has had some leg weakness as of late. indepenent with ADLs Functional Status:  Mobility: Bed Mobility Overal bed mobility: Needs Assistance Bed Mobility: Supine to Sit, Sit to Supine Supine to sit: Mod assist, +2 for physical assistance Sit to supine: Mod assist, +2 for physical assistance General bed mobility comments: A for legs and trunk with support throughout due to patient unable or too restless to keep static sitting balance. Transfers Overall transfer level: Needs assistance Equipment used: 2 person hand held assist Transfers: Sit to/from Stand Sit to Stand: Mod assist, +2 physical assistance General transfer comment: with use of gait belt--partial stand Mod A +2 with Max A +2 for shifting hips to left x 5 to get up in bed, performed x4 times.  Ambulation/Gait General Gait Details: NOt able.    ADL: ADL Overall ADL's : Needs assistance/impaired Eating/Feeding: NPO Grooming: Moderate assistance Grooming Details (indicate cue type and reason): supported sitting Upper Body Bathing: Maximal assistance Upper Body Bathing Details (indicate cue type and reason): supported sitting Lower Body Bathing: Total assistance Lower Body Bathing Details (indicate cue type and reason): Mod A+2 partial sit>stand Upper Body Dressing : Total assistance Upper Body Dressing Details (indicate cue type and reason):  supported sitting Lower Body Dressing: Total assistance Lower Body Dressing Details (indicate cue type and reason): Mod A+2 partial sit>stand Toilet Transfer: Maximal assistance, +2 for physical assistance Toilet Transfer Details (indicate cue type and reason): partial stand>shift hips up in bed x5 Toileting- Clothing Manipulation and Hygiene: Total assistance Toileting - Clothing Manipulation Details (indicate cue type and reason): Mod A+2 partial sit>stand  Cognition: Cognition Overall Cognitive Status: Impaired/Different from baseline Orientation Level: Disoriented to person, Oriented to place, Disoriented to time, Disoriented to situation Cognition Arousal/Alertness: Awake/alert Behavior During Therapy: Restless, Impulsive Overall Cognitive Status: Impaired/Different from baseline Area of Impairment: Orientation, Following commands, Safety/judgement, Problem solving Orientation Level: Disoriented to, Place, Situation(knows it is 2020 ) Following Commands: Follows one step commands inconsistently, Follows one step commands with increased time Safety/Judgement: Decreased awareness of safety, Decreased awareness of deficits Problem Solving: Decreased initiation, Requires verbal cues, Requires tactile cues General Comments: He was all over the place while seated EOB--difficulty maintaining static balance. No awareness of safety/deficits. Perseverating on wanting to drink water despite beingtold multiple times he is now allowed.   Blood pressure (!) 164/78, pulse (!) 103, temperature 99  F (37.2 C), temperature source Oral, resp. rate 20, height 5\' 6"  (1.676 m), weight 80.9 kg, SpO2 96 %. Physical Exam  Vitals reviewed. Constitutional: He appears well-developed and well-nourished.  B/l UE restraints  HENT:  Head: Normocephalic.  Nasogastric tube in place Abrasions to forehead  Eyes: Right eye exhibits no discharge. Left eye exhibits no discharge.  Pupils reactive to light  Neck:  Normal range of motion. Neck supple. No thyromegaly present.  Cardiovascular: Normal rate and regular rhythm.  Respiratory:  Limited inspiratory effort but clear to auscultation  GI: Soft. Bowel sounds are normal.  Musculoskeletal:     Comments: No edema or tenderness in extremities  Neurological: He is alert.  Patient a bit lethargic but arousable.  Makes eye contact with examiner.  Bilateral mittens in place for patient safety. Not following any commands, moving b/l UE spontaneously  Skin: Skin is warm and dry.  Psychiatric:  Unable to assess due to mentation    Results for orders placed or performed during the hospital encounter of 04/21/18 (from the past 24 hour(s))  Glucose, capillary     Status: Abnormal   Collection Time: 04/29/18 11:40 AM  Result Value Ref Range   Glucose-Capillary 158 (H) 70 - 99 mg/dL  Glucose, capillary     Status: Abnormal   Collection Time: 04/29/18  3:51 PM  Result Value Ref Range   Glucose-Capillary 220 (H) 70 - 99 mg/dL  Glucose, capillary     Status: Abnormal   Collection Time: 04/29/18  8:17 PM  Result Value Ref Range   Glucose-Capillary 263 (H) 70 - 99 mg/dL  Glucose, capillary     Status: Abnormal   Collection Time: 04/29/18 11:48 PM  Result Value Ref Range   Glucose-Capillary 267 (H) 70 - 99 mg/dL  Glucose, capillary     Status: Abnormal   Collection Time: 04/30/18  4:06 AM  Result Value Ref Range   Glucose-Capillary 296 (H) 70 - 99 mg/dL  CBC     Status: Abnormal   Collection Time: 04/30/18  4:52 AM  Result Value Ref Range   WBC 6.0 4.0 - 10.5 K/uL   RBC 3.53 (L) 4.22 - 5.81 MIL/uL   Hemoglobin 10.6 (L) 13.0 - 17.0 g/dL   HCT 34.0 (L) 39.0 - 52.0 %   MCV 96.3 80.0 - 100.0 fL   MCH 30.0 26.0 - 34.0 pg   MCHC 31.2 30.0 - 36.0 g/dL   RDW 13.7 11.5 - 15.5 %   Platelets 249 150 - 400 K/uL   nRBC 0.0 0.0 - 0.2 %  Basic metabolic panel     Status: Abnormal   Collection Time: 04/30/18  4:52 AM  Result Value Ref Range   Sodium 144  135 - 145 mmol/L   Potassium 3.5 3.5 - 5.1 mmol/L   Chloride 114 (H) 98 - 111 mmol/L   CO2 24 22 - 32 mmol/L   Glucose, Bld 313 (H) 70 - 99 mg/dL   BUN 25 (H) 8 - 23 mg/dL   Creatinine, Ser 0.87 0.61 - 1.24 mg/dL   Calcium 7.8 (L) 8.9 - 10.3 mg/dL   GFR calc non Af Amer >60 >60 mL/min   GFR calc Af Amer >60 >60 mL/min   Anion gap 6 5 - 15  Magnesium     Status: None   Collection Time: 04/30/18  4:52 AM  Result Value Ref Range   Magnesium 1.9 1.7 - 2.4 mg/dL  Phosphorus     Status: None  Collection Time: 04/30/18  4:52 AM  Result Value Ref Range   Phosphorus 3.3 2.5 - 4.6 mg/dL  Glucose, capillary     Status: Abnormal   Collection Time: 04/30/18  8:01 AM  Result Value Ref Range   Glucose-Capillary 273 (H) 70 - 99 mg/dL   No results found.  Assessment/Plan: Diagnosis: Encephalopathy Labs independently reviewed.  Records reviewed and summated above.  1. Does the need for close, 24 hr/day medical supervision in concert with the patient's rehab needs make it unreasonable for this patient to be served in a less intensive setting? Yes  2. Co-Morbidities requiring supervision/potential complications: CAD with angioplasty (cont meds), COPD/tobacco abuse (monitor RR and O2 sats with increased mobility), HTN (monitor and provide prns in accordance with increased physical exertion and pain), bacteremia (cont IV abx per recs), ABLA (repeat labs, transfuse to ensure appropriate perfusion for increased activity tolerance) 3. Due to safety, skin/wound care, disease management, medication administration and patient education, does the patient require 24 hr/day rehab nursing? Yes 4. Does the patient require coordinated care of a physician, rehab nurse, PT (1-2 hrs/day, 5 days/week), OT (1-2 hrs/day, 5 days/week) and SLP (1-2 hrs/day, 5 days/week) to address physical and functional deficits in the context of the above medical diagnosis(es)? Yes Addressing deficits in the following areas: balance,  endurance, locomotion, strength, transferring, bathing, dressing, toileting, cognition, speech, swallowing and psychosocial support 5. Can the patient actively participate in an intensive therapy program of at least 3 hrs of therapy per day at least 5 days per week? Potentially 6. The potential for patient to make measurable gains while on inpatient rehab is excellent 7. Anticipated functional outcomes upon discharge from inpatient rehab are supervision and min assist  with PT, supervision and min assist with OT, supervision and min assist with SLP. 8. Estimated rehab length of stay to reach the above functional goals is: 14-18 days. 9. Anticipated D/C setting: Home 10. Anticipated post D/C treatments: HH therapy and Home excercise program 11. Overall Rehab/Functional Prognosis: good  RECOMMENDATIONS: This patient's condition is appropriate for continued rehabilitative care in the following setting: CIR if caregiver support available upon discharge. Patient has agreed to participate in recommended program. Potentially Note that insurance prior authorization may be required for reimbursement for recommended care.  Comment: Rehab Admissions Coordinator to follow up.   I have personally performed a face to face diagnostic evaluation, including, but not limited to relevant history and physical exam findings, of this patient and developed relevant assessment and plan.  Additionally, I have reviewed and concur with the physician assistant's documentation above.   Delice Lesch, MD, ABPMR Lavon Paganini Angiulli, PA-C 04/30/2018

## 2018-05-01 DIAGNOSIS — J9600 Acute respiratory failure, unspecified whether with hypoxia or hypercapnia: Secondary | ICD-10-CM

## 2018-05-01 LAB — GLUCOSE, CAPILLARY
Glucose-Capillary: 198 mg/dL — ABNORMAL HIGH (ref 70–99)
Glucose-Capillary: 229 mg/dL — ABNORMAL HIGH (ref 70–99)
Glucose-Capillary: 246 mg/dL — ABNORMAL HIGH (ref 70–99)
Glucose-Capillary: 257 mg/dL — ABNORMAL HIGH (ref 70–99)
Glucose-Capillary: 264 mg/dL — ABNORMAL HIGH (ref 70–99)
Glucose-Capillary: 279 mg/dL — ABNORMAL HIGH (ref 70–99)

## 2018-05-01 LAB — CBC
HCT: 36 % — ABNORMAL LOW (ref 39.0–52.0)
Hemoglobin: 11.2 g/dL — ABNORMAL LOW (ref 13.0–17.0)
MCH: 30.8 pg (ref 26.0–34.0)
MCHC: 31.1 g/dL (ref 30.0–36.0)
MCV: 98.9 fL (ref 80.0–100.0)
Platelets: 283 10*3/uL (ref 150–400)
RBC: 3.64 MIL/uL — ABNORMAL LOW (ref 4.22–5.81)
RDW: 13.8 % (ref 11.5–15.5)
WBC: 8.4 10*3/uL (ref 4.0–10.5)
nRBC: 0 % (ref 0.0–0.2)

## 2018-05-01 LAB — BASIC METABOLIC PANEL
Anion gap: 7 (ref 5–15)
BUN: 25 mg/dL — ABNORMAL HIGH (ref 8–23)
CO2: 26 mmol/L (ref 22–32)
Calcium: 8.3 mg/dL — ABNORMAL LOW (ref 8.9–10.3)
Chloride: 115 mmol/L — ABNORMAL HIGH (ref 98–111)
Creatinine, Ser: 0.83 mg/dL (ref 0.61–1.24)
GFR calc Af Amer: >60 mL/min (ref >60)
GFR calc non Af Amer: >60 mL/min (ref >60)
Glucose, Bld: 198 mg/dL — ABNORMAL HIGH (ref 70–99)
Potassium: 3.9 mmol/L (ref 3.5–5.1)
Sodium: 148 mmol/L — ABNORMAL HIGH (ref 135–145)

## 2018-05-01 MED ORDER — ZOLPIDEM TARTRATE 5 MG PO TABS
5.0000 mg | ORAL_TABLET | Freq: Once | ORAL | Status: AC
  Administered 2018-05-01: 5 mg via ORAL
  Filled 2018-05-01: qty 1

## 2018-05-01 MED ORDER — METOPROLOL TARTRATE 25 MG PO TABS
25.0000 mg | ORAL_TABLET | Freq: Two times a day (BID) | ORAL | Status: DC
  Administered 2018-05-01 – 2018-05-03 (×5): 25 mg via ORAL
  Filled 2018-05-01 (×5): qty 1

## 2018-05-01 MED ORDER — IPRATROPIUM-ALBUTEROL 0.5-2.5 (3) MG/3ML IN SOLN
3.0000 mL | Freq: Three times a day (TID) | RESPIRATORY_TRACT | Status: DC
  Administered 2018-05-01 – 2018-05-03 (×7): 3 mL via RESPIRATORY_TRACT
  Filled 2018-05-01 (×7): qty 3

## 2018-05-01 NOTE — Progress Notes (Signed)
PROGRESS NOTE    Paul [name]  XIP:[id number] DOB: 1950-08-19 DOA: 04/21/2018 PCP: Leanna Battles, MD    Brief Narrative:  68yo male with COPD on supplemental O2, CAD s/p stents 2013 presenting after being found confused and dyspneic by family. Profoundly hypoxic in ED. Intubated and found to have RUL pneumonia, strep pneumo bacteremia, and DKA.  Continues to have delirium and agitation after extubation.   Assessment & Plan:   Active Problems:   CAP (community acquired pneumonia)   Respiratory failure ([place])   Pressure injury of skin   Coronary artery disease involving native coronary artery of native heart without angina pectoris   Tobacco abuse   Hypertension   Acute blood loss anemia   Bacteremia   Acute Hypoxic Respiratory Failure: Improving. Extubated 1/11 stable on [place]. Secondary to RUL PNA. Blood cultures with Strep Pneumonia, RVP +RSV, and Sputum cx with MSSA. - Continue Ceftriaxone for total of 2 weeks, 11/14 days, tentative stop date January 19. - Wean oxygen as able. -PT is recommending CIR.  Encephalopathy:  Still confused. -Continue clonidine protocol. - IV haldol 2-4 mg q6h prn  -Patient is off the Precedex infusion. -Continue to monitor  Dysphagia.  Failed swallow evaluation, most likely due to altered mental status.  Cortrak was placed and he was started on tube feed. -Continue tube feeds. -Follow with speech recommendations.  Strep Pneumo Bacteremia: Remains afebrile with no leukocytosis. - Continue Ceftriaxone (Day 11 / 14) - Trend fever curve - Repeat blood cultures & resp cultures NGTD -Continue PT and OT.  Mild Hypernatremia: Resolved.  Hyperglycemia: Blood sugar elevated with the start of tube feeds. -Levemir 10 units BID started by the ICU team, we will titrate accordingly. - Moderate SSI q4h -Continue to monitor Accu-Cheks  Constipation: Large BM 1/11 after lactulose  - BID Senokot  COPD: On home O2 - Continue brovana & pulmicort     Hx HTN: At home Toprol 50 mg daily. -Patient currently n.p.o. on tube feeds.  Unable to crush the long-acting therefore changed to short-acting beta-blocker. -Home amlodipine can be restarted if needed. -Continue to monitor blood pressure closely and adjust medications as needed.  IV prn hydralazine  CAD s/p Stent Placement 2013 - plavix 75 mg qd   DVT prophylaxis: Lovenox subcutaneous Code Status: Full code Family Communication: No family at bedside Disposition Plan: Rehab  Consultants:   None   Procedures:   None  Antimicrobials:  Ceftriaxone (day 11/14)   Subjective: Patient opening eyes but nonverbal.  Having generalized weakness.  Objective: Vitals:   05/01/18 1145 05/01/18 1200 05/01/18 1217 05/01/18 1300  BP: (!) 179/91 (!) 159/96 (!) 159/96 (!) 179/106  Pulse: 96 96 99 99  Resp: (!) 33 (!) 26 (!) 30 (!) 29  Temp:      TempSrc:      SpO2: 90% 95% 94% 94%  Weight:      Height:        Intake/Output Summary (Last 24 hours) at 05/01/2018 1358 Last data filed at 05/01/2018 1300 Gross per 24 hour  Intake 2142.7 ml  Output 1480 ml  Net 662.7 ml   Filed Weights   04/29/18 0500 04/30/18 0500 05/01/18 0158  Weight: 82 kg 80.9 kg 82.4 kg    Examination:  General exam: Ill-appearing male, awake but nonverbal Respiratory system: Bilateral rhonchi, no wheezing Cardiovascular system: S1 & S2 heard, RRR. No JVD, murmurs, rubs, gallops or clicks. No pedal edema. Gastrointestinal system: Abdomen is nondistended, soft and nontender. No organomegaly or  masses felt. Normal bowel sounds heard. Central nervous system: awake but nonverbal Extremities: generalized weakness Skin: No rashes, lesions or ulcers Psychiatry: Blunted affect   Data Reviewed: I have personally reviewed following labs and imaging studies  CBC: Recent Labs  Lab 04/25/18 0603 04/26/18 0555 04/27/18 0655 04/30/18 0452 05/01/18 0343  WBC 8.8 10.7* 11.0* 6.0 8.4  HGB 11.6* 10.7* 11.1*  10.6* 11.2*  HCT 38.8* 36.2* 36.5* 34.0* 36.0*  MCV 98.7 98.4 98.4 96.3 98.9  PLT 171 178 182 249 882   Basic Metabolic Panel: Recent Labs  Lab 04/25/18 0603 04/26/18 0555 04/27/18 0655 04/28/18 0920 04/30/18 0452 05/01/18 0343  NA 147* 145 144 146* 144 148*  K 4.4 4.3 4.3 3.9 3.5 3.9  CL 117* 116* 115* 117* 114* 115*  CO2 21* _0 GLUCOSE 253* 140* 214* 156* 313* 198*  BUN 37* 37* 35* 27* 25* 25*  CREATININE 1.07 1.25* 0.96 0.86 0.87 0.83  CALCIUM 8.0* 7.7* 7.8* 8.0* 7.8* 8.3*  MG 1.7 1.6* 2.0  --  1.9  --   PHOS 3.1 3.4 2.6  --  3.3  --    GFR: Estimated Creatinine Clearance: 84.6 mL/min (by C-G formula based on SCr of 0.83 mg/dL). Liver Function Tests: No results for input(s): AST, ALT, ALKPHOS, BILITOT, PROT, ALBUMIN in the last 168 hours. No results for input(s): LIPASE, AMYLASE in the last 168 hours. No results for input(s): AMMONIA in the last 168 hours. Coagulation Profile: No results for input(s): INR, PROTIME in the last 168 hours. Cardiac Enzymes: No results for input(s): CKTOTAL, CKMB, CKMBINDEX, TROPONINI in the last 168 hours. BNP (last 3 results) No results for input(s): PROBNP in the last 8760 hours. HbA1C: No results for input(s): HGBA1C in the last 72 hours. CBG: Recent Labs  Lab 04/30/18 1944 04/30/18 2346 05/01/18 0353 05/01/18 0802 05/01/18 1131  GLUCAP 303* 246* 198* 229* 246*   Lipid Profile: No results for input(s): CHOL, HDL, LDLCALC, TRIG, CHOLHDL, LDLDIRECT in the last 72 hours. Thyroid Function Tests: No results for input(s): TSH, T4TOTAL, FREET4, T3FREE, THYROIDAB in the last 72 hours. Anemia Panel: No results for input(s): VITAMINB12, FOLATE, FERRITIN, TIBC, IRON, RETICCTPCT in the last 72 hours. Sepsis Labs: Recent Labs  Lab 04/25/18 0803  LATICACIDVEN 1.6    Recent Results (from the past 240 hour(s))  Urine culture     Status: None   Collection Time: 04/21/18  5:40 PM  Result Value Ref Range Status   Specimen  Description URINE, RANDOM  Final   Special Requests NONE  Final   Culture   Final    NO GROWTH Performed at [facility], [address] [place], [place] [address]    Report Status 04/23/2018 FINAL  Final  Culture, respiratory (non-expectorated)     Status: None   Collection Time: 04/21/18  5:41 PM  Result Value Ref Range Status   Specimen Description TRACHEAL ASPIRATE  Final   Special Requests NONE  Final   Gram Stain   Final    RARE WBC PRESENT, PREDOMINANTLY PMN FEW GRAM POSITIVE COCCI IN PAIRS FEW GRAM NEGATIVE COCCOBACILLI Performed at [facility], [place] [address] [place], [place] [address]    Culture   Final    FEW STAPHYLOCOCCUS AUREUS FEW STREPTOCOCCUS PNEUMONIAE    Report Status 04/25/2018 FINAL  Final   Organism ID, Bacteria STAPHYLOCOCCUS AUREUS  Final   Organism ID, Bacteria STREPTOCOCCUS PNEUMONIAE  Final  Susceptibility   Staphylococcus aureus - MIC*    CIPROFLOXACIN <=0.5 SENSITIVE Sensitive     ERYTHROMYCIN <=0.25 SENSITIVE Sensitive     GENTAMICIN <=0.5 SENSITIVE Sensitive     OXACILLIN 0.5 SENSITIVE Sensitive     TETRACYCLINE <=1 SENSITIVE Sensitive     VANCOMYCIN 1 SENSITIVE Sensitive     TRIMETH/SULFA <=10 SENSITIVE Sensitive     CLINDAMYCIN <=0.25 SENSITIVE Sensitive     RIFAMPIN <=0.5 SENSITIVE Sensitive     Inducible Clindamycin NEGATIVE Sensitive     * FEW STAPHYLOCOCCUS AUREUS   Streptococcus pneumoniae - MIC*    ERYTHROMYCIN <=0.12 SENSITIVE Sensitive     LEVOFLOXACIN 1 SENSITIVE Sensitive     VANCOMYCIN 0.5 SENSITIVE Sensitive     PENICILLIN (meningitis) <=0.06 SENSITIVE Sensitive     PENICILLIN (non-meningitis) <=0.06 SENSITIVE Sensitive     CEFTRIAXONE (non-meningitis) <=0.12 SENSITIVE Sensitive     CEFTRIAXONE (meningitis) <=0.12 SENSITIVE Sensitive     * FEW STREPTOCOCCUS PNEUMONIAE  MRSA PCR Screening     Status: None   Collection Time: 04/21/18 11:03 PM  Result Value Ref Range Status   MRSA by PCR NEGATIVE NEGATIVE  Final    Comment:        The GeneXpert MRSA Assay (FDA approved for NASAL specimens only), is one component of a comprehensive MRSA colonization surveillance program. It is not intended to diagnose MRSA infection nor to guide or monitor treatment for MRSA infections. Performed at [facility], [place] [address], [place], [place] [address]   Respiratory Panel by PCR     Status: Abnormal   Collection Time: 04/22/18  3:40 PM  Result Value Ref Range Status   Adenovirus NOT DETECTED NOT DETECTED Final   Coronavirus 229E NOT DETECTED NOT DETECTED Final   Coronavirus HKU1 NOT DETECTED NOT DETECTED Final   Coronavirus NL63 NOT DETECTED NOT DETECTED Final   Coronavirus OC43 NOT DETECTED NOT DETECTED Final   Metapneumovirus NOT DETECTED NOT DETECTED Final   Rhinovirus / Enterovirus NOT DETECTED NOT DETECTED Final   Influenza A NOT DETECTED NOT DETECTED Final   Influenza B NOT DETECTED NOT DETECTED Final   Parainfluenza Virus 1 NOT DETECTED NOT DETECTED Final   Parainfluenza Virus 2 NOT DETECTED NOT DETECTED Final   Parainfluenza Virus 3 NOT DETECTED NOT DETECTED Final   Parainfluenza Virus 4 NOT DETECTED NOT DETECTED Final   Respiratory Syncytial Virus DETECTED (A) NOT DETECTED Final    Comment: CRITICAL RESULT CALLED TO, READ BACK BY AND VERIFIED WITH: J POOLE RN 04/22/18 1830 JDW    Bordetella pertussis NOT DETECTED NOT DETECTED Final   Chlamydophila pneumoniae NOT DETECTED NOT DETECTED Final   Mycoplasma pneumoniae NOT DETECTED NOT DETECTED Final    Comment: Performed at [facility], [place]. [address] [place], [place] [address]  Culture, respiratory (non-expectorated)     Status: None   Collection Time: 04/25/18  8:20 AM  Result Value Ref Range Status   Specimen Description TRACHEAL ASPIRATE  Final   Special Requests NONE  Final   Gram Stain   Final    ABUNDANT WBC PRESENT, PREDOMINANTLY PMN RARE GRAM POSITIVE COCCI Performed at [facility], [address] [place] [address]  [place], [place] [address]    Culture RARE CANDIDA ALBICANS  Final   Report Status 04/28/2018 FINAL  Final  Culture, blood (routine x 2)     Status: None   Collection Time: 04/25/18  3:38 PM  Result Value Ref Range Status   Specimen Description  BLOOD RIGHT HAND  Final   Special Requests   Final    AEROBIC BOTTLE ONLY Blood Culture results may not be optimal due to an inadequate volume of blood received in culture bottles   Culture   Final    NO GROWTH 5 DAYS Performed at [facility], [place] [address], [place], [place] [address]    Report Status 04/30/2018 FINAL  Final  Culture, blood (routine x 2)     Status: None   Collection Time: 04/25/18  3:38 PM  Result Value Ref Range Status   Specimen Description BLOOD RIGHT HAND  Final   Special Requests   Final    AEROBIC BOTTLE ONLY Blood Culture results may not be optimal due to an inadequate volume of blood received in culture bottles   Culture   Final    NO GROWTH 5 DAYS Performed at [facility], [place] [address], [place], [place] [address]    Report Status 04/30/2018 FINAL  Final         Radiology Studies: No results found.      Scheduled Meds: . arformoterol  15 mcg Nebulization BID  . budesonide (PULMICORT) nebulizer solution  0.5 mg Nebulization BID  . chlorhexidine gluconate (MEDLINE KIT)  15 mL Mouth Rinse BID  . cloNIDine  0.1 mg Oral Q6H   Followed by  . [START ON 05/02/2018] cloNIDine  0.1 mg Oral Q12H   Followed by  . [START ON 05/03/2018] cloNIDine  0.1 mg Oral Daily  . clopidogrel  75 mg Oral Daily  . enoxaparin (LOVENOX) injection  40 mg Subcutaneous Q24H  . feeding supplement (PRO-STAT SUGAR FREE 64)  30 mL Oral BID  . insulin aspart  0-15 Units Subcutaneous Q4H  . insulin detemir  10 Units Subcutaneous BID  . ipratropium-albuterol  3 mL Nebulization TID  . mouth rinse  15 mL Mouth Rinse q12n4p  . metoprolol tartrate  25 mg Oral BID  . senna-docusate  1 tablet Per Tube BID   Continuous  Infusions: . sodium chloride Stopped (05/01/18 1019)  . cefTRIAXone (ROCEPHIN)  IV 2 g (05/01/18 1159)  . feeding supplement (OSMOLITE 1.5 CAL) 1,000 mL (05/01/18 0820)     LOS: 10 days    Time spent: 35 min     , MD [facility] Pager on amion  If 7PM-7AM, please contact night-coverage [email or web address] Password [email or web address] 05/01/2018, 1:58 PM

## 2018-05-01 NOTE — Progress Notes (Signed)
Pt demonstrating improved sitting balance and was able to transfer to chair with 2 person max assist with flexed posture. Pt continues to be appropriate for inpatient rehab.   05/01/18 1000  OT Visit Information  Last OT Received On 05/01/18  Assistance Needed +2  PT/OT/SLP Co-Evaluation/Treatment Yes  Reason for Co-Treatment For patient/therapist safety  OT goals addressed during session ADL's and self-care;Strengthening/ROM  History of Present Illness 68 year old man with COPD on home oxygen admitted 1/5 with right upper lobe community-acquired pneumonia requiring mechanical ventilation. Extubated 1/11. PMH includes but not limited to: OSA, Obesity, COPD, CAD, Gout, HTN, Chronic exertional dyspnea, C spine surgery 2012.   Precautions  Precautions Fall  Pain Assessment  Pain Assessment Faces  Faces Pain Scale 0  Cognition  Arousal/Alertness Awake/alert  Behavior During Therapy Restless;Impulsive  Overall Cognitive Status Impaired/Different from baseline  Area of Impairment Orientation;Following commands;Safety/judgement;Problem solving  Orientation Level Disoriented to;Place;Situation  Following Commands Follows one step commands inconsistently;Follows one step commands with increased time  Safety/Judgement Decreased awareness of safety;Decreased awareness of deficits  Problem Solving Decreased initiation;Requires verbal cues;Requires tactile cues  General Comments following cues more consistnetly today  Upper Extremity Assessment  Upper Extremity Assessment Generalized weakness  Lower Extremity Assessment  Lower Extremity Assessment Defer to PT evaluation  ADL  Overall ADL's  Needs assistance/impaired  Eating/Feeding Maximal assistance;Sitting  Eating/Feeding Details (indicate cue type and reason) ice chips  Grooming Maximal assistance;Sitting;Wash/dry face  Lower Body Dressing Total assistance;Bed level  Toilet Transfer +2 for physical assistance;Maximal assistance;Stand-pivot   Toilet Transfer Details (indicate cue type and reason) simulated to chair  Bed Mobility  Overal bed mobility Needs Assistance  Bed Mobility Supine to Sit  Supine to sit Mod assist  General bed mobility comments assist with legs over EOB, raise trunk from sitting. able to balance sitting EOB with stand by assist   Balance  Overall balance assessment Needs assistance  Sitting-balance support Single extremity supported;Feet supported  Sitting balance-Leahy Scale Fair  Sitting balance - Comments able to sit with supervision  Standing balance-Leahy Scale Zero  Standing balance comment external support to maintain balance. Not able to stand fully upright,.   Transfers  Overall transfer level Needs assistance  Equipment used 2 person hand held assist  Transfers Sit to/from Stand  Sit to Stand Max assist;+2 physical assistance  General transfer comment max A to stand, able to side step and pivot into chair, max A of two for stability, poor sequencing   OT - End of Session  Equipment Utilized During Treatment Gait belt  Activity Tolerance Patient tolerated treatment well  Patient left in chair;with call bell/phone within reach;with chair alarm set;with restraints reapplied  Nurse Communication Mobility status;Need for lift equipment  OT Assessment/Plan  OT Plan Discharge plan remains appropriate  OT Visit Diagnosis Unsteadiness on feet (R26.81);Other abnormalities of gait and mobility (R26.89);Muscle weakness (generalized) (M62.81);Other symptoms and signs involving cognitive function  OT Frequency (ACUTE ONLY) Min 2X/week  Follow Up Recommendations CIR;Supervision/Assistance - 24 hour  AM-PAC OT "6 Clicks" Daily Activity Outcome Measure (Version 2)  Help from another person eating meals? 2  Help from another person taking care of personal grooming? 2  Help from another person toileting, which includes using toliet, bedpan, or urinal? 1  Help from another person bathing (including washing,  rinsing, drying)? 2  Help from another person to put on and taking off regular upper body clothing? 2  Help from another person to put on and taking off regular lower body  clothing? 1  6 Click Score 10  OT Goal Progression  Progress towards OT goals Progressing toward goals  Acute Rehab OT Goals  Patient Stated Goal agreeable to OOB to chair  OT Goal Formulation Patient unable to participate in goal setting  Time For Goal Achievement 05/13/18  Potential to Achieve Goals Good  OT Time Calculation  OT Start Time (ACUTE ONLY) 0917  OT Stop Time (ACUTE ONLY) 0943  OT Time Calculation (min) 26 min  OT General Charges  $OT Visit 1 Visit  OT Treatments  $Self Care/Home Management  8-22 mins  Nestor Lewandowsky, OTR/L Acute Rehabilitation Services Pager: [phone] Office: [phone]

## 2018-05-01 NOTE — Plan of Care (Signed)
  Problem: Education: Goal: Knowledge of General Education information will improve Description Including pain rating scale, medication(s)/side effects and non-pharmacologic comfort measures Outcome: Progressing   Problem: Health Behavior/Discharge Planning: Goal: Ability to manage health-related needs will improve Outcome: Progressing   Problem: Clinical Measurements: Goal: Ability to maintain clinical measurements within normal limits will improve Outcome: Progressing Goal: Will remain free from infection Outcome: Progressing Goal: Diagnostic test results will improve Outcome: Progressing Goal: Respiratory complications will improve Outcome: Progressing Goal: Cardiovascular complication will be avoided Outcome: Progressing   Problem: Activity: Goal: Risk for activity intolerance will decrease Outcome: Progressing   Problem: Nutrition: Goal: Adequate nutrition will be maintained Outcome: Progressing   Problem: Coping: Goal: Level of anxiety will decrease Outcome: Progressing   Problem: Elimination: Goal: Will not experience complications related to bowel motility Outcome: Progressing Goal: Will not experience complications related to urinary retention Outcome: Progressing   Problem: Pain Managment: Goal: General experience of comfort will improve Outcome: Progressing   Problem: Safety: Goal: Ability to remain free from injury will improve Outcome: Progressing   Problem: Skin Integrity: Goal: Risk for impaired skin integrity will decrease Outcome: Progressing   Problem: Activity: Goal: Ability to tolerate increased activity will improve Outcome: Progressing   Problem: Respiratory: Goal: Ability to maintain a clear airway and adequate ventilation will improve Outcome: Progressing   Problem: Role Relationship: Goal: Method of communication will improve Outcome: Progressing   Problem: Education: Goal: Ability to describe self-care measures that may  prevent or decrease complications (Diabetes Survival Skills Education) will improve Outcome: Progressing Goal: Individualized Educational Video(s) Outcome: Progressing   Problem: Cardiac: Goal: Ability to maintain an adequate cardiac output will improve Outcome: Progressing   Problem: Health Behavior/Discharge Planning: Goal: Ability to identify and utilize available resources and services will improve Outcome: Progressing Goal: Ability to manage health-related needs will improve Outcome: Progressing   Problem: Fluid Volume: Goal: Ability to achieve a balanced intake and output will improve Outcome: Progressing   Problem: Metabolic: Goal: Ability to maintain appropriate glucose levels will improve Outcome: Progressing   Problem: Nutritional: Goal: Maintenance of adequate nutrition will improve Outcome: Progressing Goal: Maintenance of adequate weight for body size and type will improve Outcome: Progressing   Problem: Respiratory: Goal: Will regain and/or maintain adequate ventilation Outcome: Progressing   Problem: Urinary Elimination: Goal: Ability to achieve and maintain adequate renal perfusion and functioning will improve Outcome: Progressing    OOB to chair for ~ 1 hour today. Continuing to progress.

## 2018-05-01 NOTE — Progress Notes (Signed)
Inpatient Diabetes Program Recommendations  AACE/ADA: New Consensus Statement on Inpatient Glycemic Control (2015)  Target Ranges:  Prepandial:   less than 140 mg/dL      Peak postprandial:   less than 180 mg/dL (1-2 hours)      Critically ill patients:  140 - 180 mg/dL   Lab Results  Component Value Date   GLUCAP 257 (H) 05/01/2018    Results for [name], [name] (MRN [id number]) as of 05/01/2018 16:26  Ref. Range 04/30/2018 16:33 04/30/2018 19:44 04/30/2018 23:46 05/01/2018 03:53 05/01/2018 08:02 05/01/2018 11:31 05/01/2018 15:03  Glucose-Capillary Latest Ref Range: 70 - 99 mg/dL 290 (H)  Novolog 8 units  303 (H)  Novolog  11 units 246 (H)  Novolog  5 units 198 (H)  Novolog  3 units 229 (H)  Novolog 5 units 246 (H)  Novolog  5 units  257 (H)  Novolog 8 units      DM 2  Home DM meds: Metformin 500mg  daily  Current DM inpatient meds: Levemir 10 units BID (started yesterday)                                               Novolog moderate (0-15 units) Q4 hours   Tube feeds at 20ml/hr. Blood glucose elevated due to tf even with addition of basal insulin yesterday. Patient has had a total of 45 units of Novolog insulin in the past 24 hours. Recommend adding Novolog tube feed coverage.   MD please consider:  1. Add Novolog 3 units Q4 hours tube feed coverage.  (Hold if tf stopped).  2. Order Hgb A1c as none in chart   Thank you.  -- Will follow during hospitalization.--  Jonna Clark RN, MSN Diabetes Coordinator Inpatient Glycemic Control Team Team Pager: [phone] (8am-5pm)

## 2018-05-01 NOTE — H&P (Signed)
Physical Medicine and Rehabilitation Admission H&P    Chief Complaint  Patient presents with  . Respiratory Distress  : HPI: Paul [name] is a 68 year old right-handed male with history of CAD with angioplasty 2013 maintained on Plavix,diabetes mellitus, COPD, tobacco abuse and supplemental oxygen, hypertension. Per chart review, patient lives alone reportedly independent prior to admission. He does have a daughter in the area. Presented 04/21/2018 with increasing shortness of breath and altered mental status. Chest x-ray indicated of right upper lobe pneumonia and profound hypoxemia. Patient did require intubation. Hospital course complicated by  Blood cultures strep Pneumo bacteremia.  Patient was extubated 04/27/2018. Completing a two-week course of ceftriaxone to end  on 05/05/2018.repeat blood cultures and respiratory cultures no growth to date. Noted bouts of agitation and restlessness requiring Precedex. Subcutaneous Lovenox for DVT prophylaxis. Patient is NPO with Cortrak tube in place. Swallow study completed 05/03/2018 dysphagia #1 honey thick liquids.Therapy evaluations completed with recommendations of physical medicine rehabilitation consult. Patient was admitted for a comprehensive rehabilitation program.  Review of Systems  Constitutional: Negative for chills and fever.  HENT: Negative for hearing loss.   Eyes: Negative for blurred vision and double vision.  Respiratory: Positive for cough and shortness of breath.   Cardiovascular: Positive for leg swelling. Negative for chest pain.  Gastrointestinal: Positive for constipation. Negative for nausea and vomiting.  Genitourinary: Negative for dysuria, flank pain and hematuria.  Musculoskeletal: Positive for myalgias.  Skin: Negative for rash.  All other systems reviewed and are negative.  Past Medical History:  Diagnosis Date  . CAD S/P percutaneous coronary angioplasty -- D1, Xience Xpedition DES 2.5 mm x 33 mm 11/24/2011   a)  Mild-to-moderate 30-40% lesions in the RCA, LAD and Circumflex. b) CULPRIT LESION: long tubular 70-80% lesion in D1 with FFR of 0.7 --> PCI w/ Xience Xpedition DES 2.5 mm x 30 mm (2.65 MM); c) Lexiscan Myoview 11/2013: No Ischemia or Infarct (Inferior Gut Attenuation) EF 63%.  Marland Kitchen COPD (chronic obstructive pulmonary disease) ([place])    "don't have full case of it; I'm right there at it"  . Essential hypertension 11/24/2011  . Exertional dyspnea, chronic   . Gout   . History of Unstable angina 11/24/2011   Referred for cardiac catheterization  . Hyperlipidemia with target LDL less than 70 11/24/2011  . Obesity (BMI 30.0-34.9)   . OSA (obstructive sleep apnea), uses oxygen at home did not tolerate cpap 11/24/2011   Past Surgical History:  Procedure Laterality Date  . CERVICAL SPINE SURGERY  2012  . CORONARY ANGIOPLASTY WITH STENT PLACEMENT  11/23/2011   "1; first one"  . LEFT HEART CATHETERIZATION WITH CORONARY ANGIOGRAM N/A 11/23/2011   Procedure: LEFT HEART CATHETERIZATION WITH CORONARY ANGIOGRAM;  Surgeon: Leonie Man, MD;  Location: [facility] CATH LAB;  Service: Cardiovascular;  Laterality: N/A;   Family History  Problem Relation Age of Onset  . Heart disease Sister   . Heart attack Brother   . Emphysema Father        smoked  . Aneurysm Father    Social History:  reports that he has been smoking cigarettes. He has a 40.00 pack-year smoking history. He has never used smokeless tobacco. He reports current alcohol use. He reports that he does not use drugs. Allergies:  Allergies  Allergen Reactions  . Codeine Nausea And Vomiting  . Duloxetine Hcl Other (See Comments)    Took at night and still felt very lethargic the following day.  (malaise)   Medications Prior  to Admission  Medication Sig Dispense Refill  . amLODipine (NORVASC) 5 MG tablet Take 5 mg by mouth daily.    . colchicine (COLCRYS) 0.6 MG tablet Take two tabs by mouth at once then a third tablet one hour later. (Patient taking  differently: Take 0.6 mg by mouth daily. Take two tabs by mouth at once then a third tablet one hour later.) 3 tablet 0  . DULoxetine (CYMBALTA) 30 MG capsule Take 30 mg by mouth daily.  3  . esomeprazole (NEXIUM) 20 MG capsule Take 20 mg by mouth daily at 12 noon.    . Fluticasone-Salmeterol (ADVAIR DISKUS) 250-50 MCG/DOSE AEPB Inhale 1 puff into the lungs 2 (two) times daily.    Marland Kitchen gabapentin (NEURONTIN) 300 MG capsule Take 300 mg by mouth at bedtime.  1  . ibuprofen (ADVIL,MOTRIN) 200 MG tablet Take 200 mg by mouth every 6 (six) hours as needed for mild pain.    . metFORMIN (GLUCOPHAGE-XR) 500 MG 24 hr tablet Take 500 mg by mouth daily with supper.  3  . metoprolol succinate (TOPROL-XL) 25 MG 24 hr tablet Take 50 mg by mouth daily.  12  . tiotropium (SPIRIVA HANDIHALER) 18 MCG inhalation capsule Place 18 mcg into inhaler and inhale daily.    Marland Kitchen zolpidem (AMBIEN) 10 MG tablet Take 10 mg by mouth at bedtime as needed for sleep.     . nitroGLYCERIN (NITROSTAT) 0.4 MG SL tablet Place 1 tablet (0.4 mg total) under the tongue every 5 (five) minutes as needed for chest pain. (Patient not taking: Reported on 04/22/2018) 25 tablet 4    Drug Regimen Review Drug regimen was reviewed and remains appropriate with no significant issues identified  Home: Home Living Family/patient expects to be discharged to:: Inpatient rehab   Functional History: Prior Function Level of Independence: Independent Comments: lives at home alone rides motorcylces, per family not the best diet or activity level and has had some leg weakness as of late. indepenent with ADLs  Functional Status:  Mobility: Bed Mobility Overal bed mobility: Needs Assistance Bed Mobility: Supine to Sit Supine to sit: Mod assist Sit to supine: Mod assist, +2 for physical assistance General bed mobility comments: assist with legs over EOB, raise trunk from sitting. able to balance sitting EOB with stand by assist  Transfers Overall transfer  level: Needs assistance Equipment used: 2 person hand held assist Transfers: Sit to/from Stand Sit to Stand: Max assist, +2 physical assistance General transfer comment: max A to stand, able to side step and pivot into chair, max A of two for stability, poor sequencing  Ambulation/Gait General Gait Details: not able yet    ADL: ADL Overall ADL's : Needs assistance/impaired Eating/Feeding: Maximal assistance, Sitting Eating/Feeding Details (indicate cue type and reason): ice chips Grooming: Maximal assistance, Sitting, Wash/dry face Grooming Details (indicate cue type and reason): supported sitting Upper Body Bathing: Maximal assistance Upper Body Bathing Details (indicate cue type and reason): supported sitting Lower Body Bathing: Total assistance Lower Body Bathing Details (indicate cue type and reason): Mod A+2 partial sit>stand Upper Body Dressing : Total assistance Upper Body Dressing Details (indicate cue type and reason): supported sitting Lower Body Dressing: Total assistance, Bed level Lower Body Dressing Details (indicate cue type and reason): Mod A+2 partial sit>stand Toilet Transfer: +2 for physical assistance, Maximal assistance, Stand-pivot Toilet Transfer Details (indicate cue type and reason): simulated to chair Toileting- Clothing Manipulation and Hygiene: Total assistance Toileting - Clothing Manipulation Details (indicate cue type and reason): Mod  A+2 partial sit>stand  Cognition: Cognition Overall Cognitive Status: Impaired/Different from baseline Orientation Level: Oriented to person Cognition Arousal/Alertness: Awake/alert Behavior During Therapy: Restless, Impulsive Overall Cognitive Status: Impaired/Different from baseline Area of Impairment: Orientation, Following commands, Safety/judgement, Problem solving Orientation Level: Disoriented to, Place, Situation Following Commands: Follows one step commands inconsistently, Follows one step commands with  increased time Safety/Judgement: Decreased awareness of safety, Decreased awareness of deficits Problem Solving: Decreased initiation, Requires verbal cues, Requires tactile cues General Comments: following cues more consistnetly today  Physical Exam: Blood pressure (!) 150/82, pulse 96, temperature 98 F (36.7 C), temperature source Oral, resp. rate 13, height 5\' 6"  (1.676 m), weight 79.8 kg, SpO2 94 %. Physical Exam  Constitutional:  obese  HENT:  Head: Normocephalic and atraumatic.  NGT  Eyes: Pupils are equal, round, and reactive to light. Right eye exhibits no discharge. Left eye exhibits no discharge.  Neck: Normal range of motion. No tracheal deviation present. No thyromegaly present.  Cardiovascular: Normal rate. Exam reveals no friction rub.  No murmur heard. Respiratory: Effort normal. No respiratory distress. He has no wheezes.  GI: He exhibits no distension. There is no abdominal tenderness.  Neurological:  Patient is alert but would go back to sleep during exam. Nasogastric tube in place. He does make eye contact. Dysarthric.  Follows some simple commands. UE 3/5 prox to distal. LE: 2-3/5 HF, KE and 3-4/5 ADF,PF. Senses pain in all 4's.     Results for orders placed or performed during the hospital encounter of 04/21/18 (from the past 48 hour(s))  Glucose, capillary     Status: Abnormal   Collection Time: 05/01/18 11:31 AM  Result Value Ref Range   Glucose-Capillary 246 (H) 70 - 99 mg/dL  Glucose, capillary     Status: Abnormal   Collection Time: 05/01/18  3:03 PM  Result Value Ref Range   Glucose-Capillary 257 (H) 70 - 99 mg/dL  Glucose, capillary     Status: Abnormal   Collection Time: 05/01/18  7:53 PM  Result Value Ref Range   Glucose-Capillary 264 (H) 70 - 99 mg/dL  Glucose, capillary     Status: Abnormal   Collection Time: 05/01/18 10:57 PM  Result Value Ref Range   Glucose-Capillary 279 (H) 70 - 99 mg/dL  Glucose, capillary     Status: Abnormal    Collection Time: 05/02/18  3:45 AM  Result Value Ref Range   Glucose-Capillary 218 (H) 70 - 99 mg/dL  CBC     Status: Abnormal   Collection Time: 05/02/18  5:45 AM  Result Value Ref Range   WBC 10.7 (H) 4.0 - 10.5 K/uL   RBC 4.10 (L) 4.22 - 5.81 MIL/uL   Hemoglobin 12.6 (L) 13.0 - 17.0 g/dL   HCT 40.5 39.0 - 52.0 %   MCV 98.8 80.0 - 100.0 fL   MCH 30.7 26.0 - 34.0 pg   MCHC 31.1 30.0 - 36.0 g/dL   RDW 13.9 11.5 - 15.5 %   Platelets 309 150 - 400 K/uL   nRBC 0.0 0.0 - 0.2 %    Comment: Performed at [facility], [place] [address], [place], [place] [address]  Basic metabolic panel     Status: Abnormal   Collection Time: 05/02/18  5:45 AM  Result Value Ref Range   Sodium 147 (H) 135 - 145 mmol/L   Potassium 3.6 3.5 - 5.1 mmol/L   Chloride 112 (H) 98 - 111 mmol/L   CO2 26 22 - 32 mmol/L  Glucose, Bld 235 (H) 70 - 99 mg/dL   BUN 21 8 - 23 mg/dL   Creatinine, Ser 0.75 0.61 - 1.24 mg/dL   Calcium 8.7 (L) 8.9 - 10.3 mg/dL   GFR calc non Af Amer >60 >60 mL/min   GFR calc Af Amer >60 >60 mL/min   Anion gap 9 5 - 15    Comment: Performed at [facility] [address] [place], [place] [address]  Hemoglobin A1c     Status: Abnormal   Collection Time: 05/02/18  7:37 AM  Result Value Ref Range   Hgb A1c MFr Bld 11.8 (H) 4.8 - 5.6 %    Comment: (NOTE) Pre diabetes:          5.7%-6.4% Diabetes:              >6.4% Glycemic control for   <7.0% adults with diabetes    Mean Plasma Glucose 291.96 mg/dL    Comment: Performed at [facility] [address], [place], [place] [address]  Glucose, capillary     Status: Abnormal   Collection Time: 05/02/18  8:06 AM  Result Value Ref Range   Glucose-Capillary 249 (H) 70 - 99 mg/dL  Magnesium     Status: None   Collection Time: 05/02/18  9:06 AM  Result Value Ref Range   Magnesium 1.9 1.7 - 2.4 mg/dL    Comment: Performed at [facility], [place] [address] [place], [place] [address]  Phosphorus     Status: None    Collection Time: 05/02/18  9:06 AM  Result Value Ref Range   Phosphorus 3.4 2.5 - 4.6 mg/dL    Comment: Performed at [facility] [address] [place], [place] [address]  Glucose, capillary     Status: Abnormal   Collection Time: 05/02/18 11:18 AM  Result Value Ref Range   Glucose-Capillary 251 (H) 70 - 99 mg/dL  Glucose, capillary     Status: Abnormal   Collection Time: 05/02/18  6:16 PM  Result Value Ref Range   Glucose-Capillary 253 (H) 70 - 99 mg/dL  Glucose, capillary     Status: Abnormal   Collection Time: 05/02/18  8:12 PM  Result Value Ref Range   Glucose-Capillary 249 (H) 70 - 99 mg/dL  Glucose, capillary     Status: Abnormal   Collection Time: 05/03/18 12:16 AM  Result Value Ref Range   Glucose-Capillary 290 (H) 70 - 99 mg/dL  Glucose, capillary     Status: Abnormal   Collection Time: 05/03/18  4:03 AM  Result Value Ref Range   Glucose-Capillary 203 (H) 70 - 99 mg/dL  CBC with Differential/Platelet     Status: Abnormal   Collection Time: 05/03/18  5:56 AM  Result Value Ref Range   WBC 11.4 (H) 4.0 - 10.5 K/uL   RBC 4.06 (L) 4.22 - 5.81 MIL/uL   Hemoglobin 12.3 (L) 13.0 - 17.0 g/dL   HCT 40.1 39.0 - 52.0 %   MCV 98.8 80.0 - 100.0 fL   MCH 30.3 26.0 - 34.0 pg   MCHC 30.7 30.0 - 36.0 g/dL   RDW 14.4 11.5 - 15.5 %   Platelets 272 150 - 400 K/uL   nRBC 0.0 0.0 - 0.2 %   Neutrophils Relative % 76 %   Neutro Abs 8.7 (H) 1.7 - 7.7 K/uL   Lymphocytes Relative 17 %   Lymphs Abs 2.0 0.7 - 4.0 K/uL   Monocytes Relative 4 %  Monocytes Absolute 0.5 0.1 - 1.0 K/uL   Eosinophils Relative 1 %   Eosinophils Absolute 0.1 0.0 - 0.5 K/uL   Basophils Relative 1 %   Basophils Absolute 0.1 0.0 - 0.1 K/uL   Immature Granulocytes 1 %   Abs Immature Granulocytes 0.10 (H) 0.00 - 0.07 K/uL    Comment: Performed at [facility] [address] [place], [place] [address]  Comprehensive metabolic panel     Status: Abnormal   Collection Time: 05/03/18  5:56 AM  Result  Value Ref Range   Sodium 146 (H) 135 - 145 mmol/L   Potassium 4.4 3.5 - 5.1 mmol/L   Chloride 111 98 - 111 mmol/L   CO2 26 22 - 32 mmol/L   Glucose, Bld 224 (H) 70 - 99 mg/dL   BUN 20 8 - 23 mg/dL   Creatinine, Ser 0.83 0.61 - 1.24 mg/dL   Calcium 8.5 (L) 8.9 - 10.3 mg/dL   Total Protein 6.4 (L) 6.5 - 8.1 g/dL   Albumin 2.2 (L) 3.5 - 5.0 g/dL   AST 63 (H) 15 - 41 U/L   ALT 47 (H) 0 - 44 U/L   Alkaline Phosphatase 99 38 - 126 U/L   Total Bilirubin 0.8 0.3 - 1.2 mg/dL   GFR calc non Af Amer >60 >60 mL/min   GFR calc Af Amer >60 >60 mL/min   Anion gap 9 5 - 15    Comment: Performed at [facility], [place] [address], [place], [place] [address]  Magnesium     Status: None   Collection Time: 05/03/18  5:56 AM  Result Value Ref Range   Magnesium 1.8 1.7 - 2.4 mg/dL    Comment: Performed at [facility] [address] [place], [place] [address]  Phosphorus     Status: None   Collection Time: 05/03/18  5:56 AM  Result Value Ref Range   Phosphorus 4.0 2.5 - 4.6 mg/dL    Comment: Performed at [facility] [address] [place], [place] [address]  Glucose, capillary     Status: Abnormal   Collection Time: 05/03/18  7:54 AM  Result Value Ref Range   Glucose-Capillary 114 (H) 70 - 99 mg/dL   Dg Chest Port 1 View  Result Date: 05/03/2018 CLINICAL DATA:  Shortness of breath EXAM: PORTABLE CHEST 1 VIEW COMPARISON:  04/27/2018 FINDINGS: Endotracheal tube has been removed. Feeding catheter remains extending into the stomach. Postsurgical changes in the cervical spine are noted. Cardiac shadow is stable. Left lung remains clear. Persistent right upper lobe infiltrate is noted IMPRESSION: Stable right upper lobe infiltrate. Electronically Signed   By: Inez Catalina M.D.   On: 05/03/2018 08:18       Medical Problem List and Plan: 1.  Encephalopathy/debility secondary to  Acute hypoxic respiratory failure/RUL pneumonia complicated by history of COPD oxygen dependent. Continue  nebulizers as directed  -admit to inpatient rehab 2.  DVT Prophylaxis/Anticoagulation: Lovenox. Monitor for any bleeding episodes 3. Pain Management:  Tylenol as needed 4. Mood/delirium:  Provide emotional support 5. Neuropsych: This patient is capable of making decisions on his own behalf. 6. Skin/Wound Care:  Routine skin checks 7. Fluids/Electrolytes/Nutrition:  Routine in and out's with follow-up chemistries 8. Dysphagia.Cortrak tube.Swallow study 05/03/2018 dysphagia #1 honey liquids. Follow-up speech therapy for advancement of diet 9.ID/Strep Pneumo Bacteremia. Continue ceftriaxone through 05/05/2018 and stop 10. CAD with stenting. Plavix. 11. Hypertension. Clonidine 0.1 mg daily, Toprol 50 mg daily 12.  Diabetes mellitus with peripheral neuropathy. Levemir 10 units twice a day. Check blood sugars before meals and at bedtime. Patient on Glucophage 500 mg daily prior to admission  -adjust regimen as needed 13. History of gout. Monitor for any flareups 14. Constipation. Laxative assistance.  Post Admission Physician Evaluation: 1. Functional deficits secondary  to encephalopathy, debility. 2. Patient is admitted to receive collaborative, interdisciplinary care between the physiatrist, rehab nursing staff, and therapy team. 3. Patient's level of medical complexity and substantial therapy needs in context of that medical necessity cannot be provided at a lesser intensity of care such as a SNF. 4. Patient has experienced substantial functional loss from his/her baseline which was documented above under the "Functional History" and "Functional Status" headings.  Judging by the patient's diagnosis, physical exam, and functional history, the patient has potential for functional progress which will result in measurable gains while on inpatient rehab.  These gains will be of substantial and practical use upon discharge  in facilitating mobility and self-care at the household level. 5. Physiatrist  will provide 24 hour management of medical needs as well as oversight of the therapy plan/treatment and provide guidance as appropriate regarding the interaction of the two. 6. The Preadmission Screening has been reviewed and patient status is unchanged unless otherwise stated above. 7. 24 hour rehab nursing will assist with bladder management, bowel management, safety, skin/wound care, disease management, medication administration, pain management and patient education  and help integrate therapy concepts, techniques,education, etc. 8. PT will assess and treat for/with: Lower extremity strength, range of motion, stamina, balance, functional mobility, safety, adaptive techniques and equipment, NMR, family ed.   Goals are: supervision to min assist. 9. OT will assess and treat for/with: ADL's, functional mobility, safety, upper extremity strength, adaptive techniques and equipment, NMR, family education.   Goals are: supervision to min assist. Therapy may proceed with showering this patient. 10. SLP will assess and treat for/with: cognition, communication, swallowing.  Goals are: supervision to min assist. 11. Case Management and Social Worker will assess and treat for psychological issues and discharge planning. 12. Team conference will be held weekly to assess progress toward goals and to determine barriers to discharge. 13. Patient will receive at least 3 hours of therapy per day at least 5 days per week. 14. ELOS: 14-18 days       15. Prognosis:  excellent   I have personally performed a face to face diagnostic evaluation of this patient and formulated the key components of the plan.  Additionally, I have personally reviewed laboratory data, imaging studies, as well as relevant notes and concur with the physician assistant's documentation above.  Meredith Staggers, MD, FAAPMR    Lavon Paganini Woods, PA-C 05/03/2018

## 2018-05-01 NOTE — Progress Notes (Signed)
CPT not done at this time - pt is sleeping.

## 2018-05-01 NOTE — Progress Notes (Signed)
Physical Therapy Treatment Patient Details Name: Paul [name] MRN: [id number] DOB: 30-Apr-1950 Today's Date: 05/01/2018    History of Present Illness 68 year old man with COPD on home oxygen admitted 1/5 with right upper lobe community-acquired pneumonia requiring mechanical ventilation. Extubated 1/11. PMH includes but not limited to: OSA, Obesity, COPD, CAD, Gout, HTN, Chronic exertional dyspnea, C spine surgery 2012.     PT Comments    Patient demonstrating progress with therapy today, with improved sitting balance at EOB and able to transfer to chair this visit. Continues to demonstrate weakness, balance, and cognitive deficits. Rec CIR once medically stable for d/c PT.     Follow Up Recommendations  CIR     Equipment Recommendations  None recommended by PT    Recommendations for Other Services Rehab consult     Precautions / Restrictions Precautions Precautions: Fall Restrictions Weight Bearing Restrictions: No    Mobility  Bed Mobility Overal bed mobility: Needs Assistance Bed Mobility: Supine to Sit;Sit to Supine     Supine to sit: Mod assist;+2 for physical assistance Sit to supine: Mod assist;+2 for physical assistance   General bed mobility comments: assist with legs over EOB, raise trunk from sitting. able to balance sitting EOB with stand by assist   Transfers Overall transfer level: Needs assistance Equipment used: 2 person hand held assist Transfers: Sit to/from Stand Sit to Stand: Max assist;+2 physical assistance         General transfer comment: max A to stand, able to side step and pivot into chair, max A of two for stability, poor sequencing   Ambulation/Gait             General Gait Details: not able yet   Stairs             Wheelchair Mobility    Modified Rankin (Stroke Patients Only)       Balance Overall balance assessment: Needs assistance Sitting-balance support: Single extremity supported;Feet supported Sitting  balance-Leahy Scale: Poor Sitting balance - Comments: able to keep balance statically momentarily but then would loose balance v. shift weight and lose balance, no balance reactions, unpredictable.      Standing balance-Leahy Scale: Zero                              Cognition Arousal/Alertness: Awake/alert Behavior During Therapy: Restless;Impulsive Overall Cognitive Status: Impaired/Different from baseline Area of Impairment: Orientation;Following commands;Safety/judgement;Problem solving                 Orientation Level: Disoriented to;Place;Situation     Following Commands: Follows one step commands inconsistently;Follows one step commands with increased time Safety/Judgement: Decreased awareness of safety;Decreased awareness of deficits   Problem Solving: Decreased initiation;Requires verbal cues;Requires tactile cues General Comments: following cues more consistnetly today      Exercises      General Comments        Pertinent Vitals/Pain Faces Pain Scale: No hurt Pain Location: generalized  Pain Descriptors / Indicators: Discomfort;Grimacing    Home Living                      Prior Function            PT Goals (current goals can now be found in the care plan section) Acute Rehab PT Goals PT Goal Formulation: With patient Time For Goal Achievement: 05/12/18 Potential to Achieve Goals: Good Progress towards PT goals: Progressing toward goals  Frequency    Min 3X/week      PT Plan Current plan remains appropriate    Co-evaluation PT/OT/SLP Co-Evaluation/Treatment: Yes Reason for Co-Treatment: For patient/therapist safety PT goals addressed during session: Mobility/safety with mobility;Balance;Proper use of DME;Strengthening/ROM        AM-PAC PT "6 Clicks" Mobility   Outcome Measure  Help needed turning from your back to your side while in a flat bed without using bedrails?: A Lot Help needed moving from lying on  your back to sitting on the side of a flat bed without using bedrails?: A Lot Help needed moving to and from a bed to a chair (including a wheelchair)?: Total Help needed standing up from a chair using your arms (e.g., wheelchair or bedside chair)?: Total Help needed to walk in hospital room?: Total Help needed climbing 3-5 steps with a railing? : Total 6 Click Score: 8    End of Session Equipment Utilized During Treatment: Gait belt Activity Tolerance: Patient tolerated treatment well Patient left: in bed;with call bell/phone within reach;with bed alarm set Nurse Communication: Mobility status PT Visit Diagnosis: Unsteadiness on feet (R26.81)     Time: 1552-0802 PT Time Calculation (min) (ACUTE ONLY): 30 min  Charges:  $Therapeutic Activity: 8-22 mins                    Reinaldo Berber, PT, DPT Acute Rehabilitation Services Pager: [phone] Office: [place] 05/01/2018, 10:02 AM

## 2018-05-01 NOTE — PMR Pre-admission (Signed)
PMR Admission Coordinator Pre-Admission Assessment  Patient: Paul [name] is an 68 y.o., male MRN: [id number] DOB: 02-28-1951 Height: 5' 6"  (167.6 cm) Weight: 79.8 kg              Insurance Information HMO:     PPO:      PCP:      IPA:      80/20: Yes     OTHER:  PRIMARY: Medicare A and B      Policy#: [id number]      Subscriber: Patient CM Name:      Phone#:      Fax#:  Pre-Cert#:       Employer:  Benefits:  Phone #: NA     Name: Verified eligibility via [employer] on 05/03/18  Eff. Date: Part A effective: 02/15/10; Part B effective: 08/16/10     Deduct: $1,408      Out of Pocket Max: NA      Life Max: NA CIR: Covered per Medicare guidelines once yearly deductible is met      SNF: days 1-20, 100%, days 21-100, 80% Outpatient: 80%     Co-Pay: 20% Home Health: 100%      Co-Pay:  DME: 80%     Co-Pay: 20% Providers: Pt's choice  SECONDARY: Generic Commercial      Policy#: [id number]      Subscriber: Patient CM Name:       Phone#:      Fax#:  Pre-Cert#:       Employer:  Benefits:  Phone #: [phone]      Name:  Eff. Date:      Deduct:       Out of Pocket Max:       Life Max:  CIR:       SNF:  Outpatient:      Co-Pay:  Home Health:      Co-Pay:  DME:      Co-Pay:   Medicaid Application Date:       Case Manager:  Disability Application Date:       Case Worker:   Emergency Contact Information Contact Information    Name Relation Home Work [place] Daughter   [phone]   Zeiss,Jay Brother   [phone]     Current Medical History  Patient Admitting Diagnosis: Encephalopathy History of Present Illness: Paul [name] is a 68 year old right-handed male with history of CAD with angioplasty 2013 maintained on Plavix,diabetes mellitus, COPD, tobacco abuse and supplemental oxygen, hypertension. Per chart review, patient lives alone reportedly independent prior to admission. He does have a daughter in the area. Presented 04/21/2018 with increasing shortness of breath and altered  mental status. Chest x-ray indicated of right upper lobe pneumonia and profound hypoxemia. Patient did require intubation. Hospital course complicated by  Blood cultures strep Pneumo bacteremia.  Patient was extubated 04/27/2018. Completing a two-week course of ceftriaxone to end  on 05/05/2018.repeat blood cultures and respiratory cultures no growth to date. Noted bouts of agitation and restlessness requiring Precedex. Subcutaneous Lovenox for DVT prophylaxis. Patient is NPO with Cortrak tube in place. Therapy evaluations completed with recommendations of physical medicine rehabilitation consult. Patient is to be admitted for a comprehensive rehabilitation program on 05/03/18.      Past Medical History  Past Medical History:  Diagnosis Date  . CAD S/P percutaneous coronary angioplasty -- D1, Xience Xpedition DES 2.5 mm x 33 mm 11/24/2011   a) Mild-to-moderate 30-40% lesions in the RCA, LAD and Circumflex. b) CULPRIT LESION: long  tubular 70-80% lesion in D1 with FFR of 0.7 --> PCI w/ Xience Xpedition DES 2.5 mm x 30 mm (2.65 MM); c) Lexiscan Myoview 11/2013: No Ischemia or Infarct (Inferior Gut Attenuation) EF 63%.  Marland Kitchen COPD (chronic obstructive pulmonary disease) ([place])    "don't have full case of it; I'm right there at it"  . Essential hypertension 11/24/2011  . Exertional dyspnea, chronic   . Gout   . History of Unstable angina 11/24/2011   Referred for cardiac catheterization  . Hyperlipidemia with target LDL less than 70 11/24/2011  . Obesity (BMI 30.0-34.9)   . OSA (obstructive sleep apnea), uses oxygen at home did not tolerate cpap 11/24/2011    Family History  family history includes Aneurysm in his father; Emphysema in his father; Heart attack in his brother; Heart disease in his sister.  Prior Rehab/Hospitalizations:  Has the patient had major surgery during 100 days prior to admission? No  Current Medications   Current Facility-Administered Medications:  .  0.9 %  sodium chloride infusion,  , Intravenous, PRN, Lorella Nimrod, MD, Stopped at 05/01/18 1316 .  acetaminophen (TYLENOL) solution 650 mg, 650 mg, Oral, Q4H PRN, Lorella Nimrod, MD, 650 mg at 05/01/18 2038 .  albuterol (PROVENTIL) (2.5 MG/3ML) 0.083% nebulizer solution 2.5 mg, 2.5 mg, Nebulization, Q4H PRN, Lorella Nimrod, MD, 2.5 mg at 05/02/18 0532 .  arformoterol (BROVANA) nebulizer solution 15 mcg, 15 mcg, Nebulization, BID, Lorella Nimrod, MD, 15 mcg at 05/03/18 0814 .  budesonide (PULMICORT) nebulizer solution 0.5 mg, 0.5 mg, Nebulization, BID, Lorella Nimrod, MD, 0.5 mg at 05/03/18 0814 .  cefTRIAXone (ROCEPHIN) 2 g in sodium chloride 0.9 % 100 mL IVPB, 2 g, Intravenous, Q24H, Amin, Sumayya, MD, Last Rate: 200 mL/hr at 05/03/18 1239, 2 g at 05/03/18 1239 .  chlorhexidine gluconate (MEDLINE KIT) (PERIDEX) 0.12 % solution 15 mL, 15 mL, Mouth Rinse, BID, Lorella Nimrod, MD, 15 mL at 05/03/18 0959 .  clopidogrel (PLAVIX) tablet 75 mg, 75 mg, Oral, Daily, Lorella Nimrod, MD, 75 mg at 05/03/18 0952 .  enoxaparin (LOVENOX) injection 40 mg, 40 mg, Subcutaneous, Q24H, Amin, Soundra Pilon, MD, 40 mg at 05/02/18 2257 .  feeding supplement (OSMOLITE 1.5 CAL) liquid 1,000 mL, 1,000 mL, Per Tube, Continuous, Lorella Nimrod, MD, Last Rate: 55 mL/hr at 05/01/18 0820, 1,000 mL at 05/01/18 0820 .  feeding supplement (PRO-STAT SUGAR FREE 64) liquid 30 mL, 30 mL, Oral, BID, Lorella Nimrod, MD, 30 mL at 05/03/18 0951 .  guaiFENesin (MUCINEX) 12 hr tablet 1,200 mg, 1,200 mg, Oral, BID, Raiford Noble Latif, DO, 1,200 mg at 05/03/18 4132 .  haloperidol lactate (HALDOL) injection 2-4 mg, 2-4 mg, Intravenous, Q6H PRN, Lorella Nimrod, MD, 2 mg at 05/01/18 2125 .  hydrALAZINE (APRESOLINE) injection 5 mg, 5 mg, Intravenous, Q4H PRN, Lorella Nimrod, MD, 5 mg at 05/02/18 0014 .  insulin aspart (novoLOG) injection 0-15 Units, 0-15 Units, Subcutaneous, Q4H, Lorella Nimrod, MD, 3 Units at 05/03/18 1235 .  insulin aspart (novoLOG) injection 3 Units, 3 Units, Subcutaneous,  Q4H, Raiford Noble [place], [place], 3 Units at 05/03/18 1236 .  insulin detemir (LEVEMIR) injection 10 Units, 10 Units, Subcutaneous, BID, Lorella Nimrod, MD, 10 Units at 05/03/18 0953 .  ipratropium-albuterol (DUONEB) 0.5-2.5 (3) MG/3ML nebulizer solution 3 mL, 3 mL, Nebulization, TID, Matcha, Anupama, MD, 3 mL at 05/03/18 0814 .  MEDLINE mouth rinse, 15 mL, Mouth Rinse, q12n4p, Lorella Nimrod, MD, 15 mL at 05/03/18 1236 .  metoprolol tartrate (LOPRESSOR) tablet 25 mg, 25 mg, Oral, BID, Matcha,  Anupama, MD, 25 mg at 05/03/18 0952 .  ondansetron (ZOFRAN) injection 4 mg, 4 mg, Intravenous, Q6H PRN, Lorella Nimrod, MD .  polyvinyl alcohol (LIQUIFILM TEARS) 1.4 % ophthalmic solution 1 drop, 1 drop, Both Eyes, TID PRN, Lorella Nimrod, MD, 1 drop at 04/26/18 2211 .  senna-docusate (Senokot-S) tablet 1 tablet, 1 tablet, Per Tube, BID, Lorella Nimrod, MD, 1 tablet at 05/03/18 3154  Patients Current Diet:  Diet Order            DIET - DYS 1 Room service appropriate? Yes; Fluid consistency: Honey Thick  Diet effective now              Precautions / Restrictions Precautions Precautions: Fall Precaution Comments: posey belt restraints and bil mitts Restrictions Weight Bearing Restrictions: No   Has the patient had 2 or more falls or a fall with injury in the past year?No  Prior Activity Level Community (5-7x/wk): active but retired; worked in the [employer]; Independent PTA  Home Assistive Devices / Equipment    Prior Device Use: Indicate devices/aids used by the patient prior to current illness, exacerbation or injury? None of the above  Prior Functional Level Prior Function Level of Independence: Independent Comments: lives at home alone rides motorcylces, per family not the best diet or activity level and has had some leg weakness as of late. indepenent with ADLs  Self Care: Did the patient need help bathing, dressing, using the toilet or eating?  Independent  Indoor Mobility:  Did the patient need assistance with walking from room to room (with or without device)? Independent  Stairs: Did the patient need assistance with internal or external stairs (with or without device)? Independent  Functional Cognition: Did the patient need help planning regular tasks such as shopping or remembering to take medications? Needed some help  Current Functional Level Cognition  Overall Cognitive Status: Impaired/Different from baseline Orientation Level: Oriented to person Following Commands: Follows one step commands inconsistently, Follows one step commands with increased time Safety/Judgement: Decreased awareness of safety, Decreased awareness of deficits General Comments: following cues more consistnetly today    Extremity Assessment (includes Sensation/Coordination)  Upper Extremity Assessment: Generalized weakness  Lower Extremity Assessment: Defer to PT evaluation    ADLs  Overall ADL's : Needs assistance/impaired Eating/Feeding: Maximal assistance, Sitting Eating/Feeding Details (indicate cue type and reason): ice chips Grooming: Maximal assistance, Sitting, Wash/dry face Grooming Details (indicate cue type and reason): supported sitting Upper Body Bathing: Maximal assistance Upper Body Bathing Details (indicate cue type and reason): supported sitting Lower Body Bathing: Total assistance Lower Body Bathing Details (indicate cue type and reason): Mod A+2 partial sit>stand Upper Body Dressing : Total assistance Upper Body Dressing Details (indicate cue type and reason): supported sitting Lower Body Dressing: Total assistance, Bed level Lower Body Dressing Details (indicate cue type and reason): Mod A+2 partial sit>stand Toilet Transfer: +2 for physical assistance, Maximal assistance, Stand-pivot Toilet Transfer Details (indicate cue type and reason): simulated to chair Toileting- Clothing Manipulation and Hygiene: Total assistance Toileting - Clothing Manipulation  Details (indicate cue type and reason): Mod A+2 partial sit>stand    Mobility  Overal bed mobility: Needs Assistance Bed Mobility: Supine to Sit Supine to sit: Mod assist Sit to supine: Mod assist, +2 for physical assistance General bed mobility comments: assist with legs over EOB, raise trunk from sitting. able to balance sitting EOB with stand by assist     Transfers  Overall transfer level: Needs assistance Equipment used: 2 person hand held  assist Transfers: Sit to/from Stand Sit to Stand: Max assist, +2 physical assistance General transfer comment: max A to stand, able to side step and pivot into chair, max A of two for stability, poor sequencing     Ambulation / Gait / Stairs / Wheelchair Mobility  Ambulation/Gait General Gait Details: not able yet    Posture / Balance Dynamic Sitting Balance Sitting balance - Comments: able to sit with supervision Balance Overall balance assessment: Needs assistance Sitting-balance support: Single extremity supported, Feet supported Sitting balance-Leahy Scale: Fair Sitting balance - Comments: able to sit with supervision Standing balance support: Bilateral upper extremity supported, During functional activity Standing balance-Leahy Scale: Zero Standing balance comment: external support to maintain balance. Not able to stand fully upright,.     Special needs/care consideration BiPAP/CPAP: CPAP at night at home PTA CPM: no Continuous Drip IV: 0.9% sodium chloride infusion, Ceftriaxone, feeding supplement. Dialysis: no       Days: no Life Vest: no Oxygen: 6L nasal cannula Special Bed: no Trach Size: no Wound Vac (area): no      Location: no Skin: abrasion to left anterior leg, blister to left posterior leg, ecchymosis to bilateral arms, moisture associated skin damage to right groin, rash to right thigh. Pressure injury (stage I) to right side of face; skin tear to scrotum.         Bowel mgmt: incontinent, last BM: 05/02/18 Bladder mgmt:  incontinence Diabetic mgmt: no     Previous Home Environment    Discharge Living Setting Plans for Discharge Living Setting: Patient's home Type of Home at Discharge: House Discharge Home Layout: One level Discharge Home Access: Stairs to enter Entrance Stairs-Rails: None Entrance Stairs-Number of Steps: 3-4 Discharge Bathroom Shower/Tub: Tub/shower unit Discharge Bathroom Toilet: Standard Discharge Bathroom Accessibility: Yes How Accessible: Accessible via walker Does the patient have any problems obtaining your medications?: Yes (Describe)(may have difficulty if he cannot drive)  Social/Family/Support Systems Patient Roles: Other (Comment)(has grown children; retired) Sport and exercise psychologist Information: Vennie Homans (daughter): [phone]; Jaythen Hamme (Brother): [phone] Anticipated Caregiver: daughter, brother, extended family and friends Anticipated Caregiver's Contact Information: see above Ability/Limitations of Caregiver: Min A Caregiver Availability: Other (Comment)(24/7) Discharge Plan Discussed with Primary Caregiver: Yes Is Caregiver In Agreement with Plan?: Yes Does Caregiver/Family have Issues with Lodging/Transportation while Pt is in Rehab?: No   Goals/Additional Needs Patient/Family Goal for Rehab: PT/OT/SLP: Supervision/Min A Expected length of stay: 14-18 days Cultural Considerations: NA Dietary Needs: Dys1, Honey thick liquids Equipment Needs: TBD Pt/Family Agrees to Admission and willing to participate: Yes Program Orientation Provided & Reviewed with Pt/Caregiver Including Roles  & Responsibilities: Yes(pt and daughter)  Barriers to Discharge: Home environment access/layout, Lack of/limited family support, Nutrition means  Barriers to Discharge Comments: stars to enter; tub shower, caregiver support   Decrease burden of Care through IP rehab admission: NA   Possible need for SNF placement upon discharge: not anticipated as pt has good social support and pt has  good potential for improvement through CIR. If pt does not progress as expected, pt may need SNF placement.    Patient Condition: This patient's medical and functional status has changed since the consult dated 04/30/18 in which the Rehabilitation Physician determined and documented that the patient was potentially appropriate for intensive rehabilitative care in an inpatient rehabilitation facility. Issues have been addressed and update has been discussed with Dr. Naaman Plummer and patient now appropriate for inpatient rehabilitation. Social support has been addressed with pt's daugther and his brother willing to assist at  DC. Will admit to inpatient rehab today.   Preadmission Screen Completed By:  Jhonnie Garner, 05/03/2018 2:40 PM ______________________________________________________________________   Discussed status with Dr. Naaman Plummer on 05/03/18 at 2:39PM and received telephone approval for admission today.  Admission Coordinator:  Jhonnie Garner, time 2:40PM/Date 05/03/18

## 2018-05-02 DIAGNOSIS — E870 Hyperosmolality and hypernatremia: Secondary | ICD-10-CM

## 2018-05-02 DIAGNOSIS — J9620 Acute and chronic respiratory failure, unspecified whether with hypoxia or hypercapnia: Secondary | ICD-10-CM

## 2018-05-02 DIAGNOSIS — R1310 Dysphagia, unspecified: Secondary | ICD-10-CM

## 2018-05-02 LAB — GLUCOSE, CAPILLARY
Glucose-Capillary: 218 mg/dL — ABNORMAL HIGH (ref 70–99)
Glucose-Capillary: 249 mg/dL — ABNORMAL HIGH (ref 70–99)
Glucose-Capillary: 251 mg/dL — ABNORMAL HIGH (ref 70–99)
Glucose-Capillary: 253 mg/dL — ABNORMAL HIGH (ref 70–99)
Glucose-Capillary: 249 mg/dL — ABNORMAL HIGH (ref 70–99)

## 2018-05-02 LAB — HEMOGLOBIN A1C
Hgb A1c MFr Bld: 11.8 % — ABNORMAL HIGH (ref 4.8–5.6)
Mean Plasma Glucose: 291.96 mg/dL

## 2018-05-02 LAB — CBC
HCT: 40.5 % (ref 39.0–52.0)
Hemoglobin: 12.6 g/dL — ABNORMAL LOW (ref 13.0–17.0)
MCH: 30.7 pg (ref 26.0–34.0)
MCHC: 31.1 g/dL (ref 30.0–36.0)
MCV: 98.8 fL (ref 80.0–100.0)
Platelets: 309 10*3/uL (ref 150–400)
RBC: 4.1 MIL/uL — ABNORMAL LOW (ref 4.22–5.81)
RDW: 13.9 % (ref 11.5–15.5)
WBC: 10.7 10*3/uL — ABNORMAL HIGH (ref 4.0–10.5)
nRBC: 0 % (ref 0.0–0.2)

## 2018-05-02 LAB — BASIC METABOLIC PANEL
Anion gap: 9 (ref 5–15)
BUN: 21 mg/dL (ref 8–23)
CO2: 26 mmol/L (ref 22–32)
Calcium: 8.7 mg/dL — ABNORMAL LOW (ref 8.9–10.3)
Chloride: 112 mmol/L — ABNORMAL HIGH (ref 98–111)
Creatinine, Ser: 0.75 mg/dL (ref 0.61–1.24)
GFR calc Af Amer: >60 mL/min (ref >60)
GFR calc non Af Amer: >60 mL/min (ref >60)
Glucose, Bld: 235 mg/dL — ABNORMAL HIGH (ref 70–99)
Potassium: 3.6 mmol/L (ref 3.5–5.1)
Sodium: 147 mmol/L — ABNORMAL HIGH (ref 135–145)

## 2018-05-02 LAB — PHOSPHORUS: Phosphorus: 3.4 mg/dL (ref 2.5–4.6)

## 2018-05-02 LAB — MAGNESIUM: Magnesium: 1.9 mg/dL (ref 1.7–2.4)

## 2018-05-02 MED ORDER — INSULIN ASPART 100 UNIT/ML ~~LOC~~ SOLN
3.0000 [IU] | SUBCUTANEOUS | Status: DC
  Administered 2018-05-02 – 2018-05-03 (×7): 3 [IU] via SUBCUTANEOUS

## 2018-05-02 MED ORDER — GUAIFENESIN ER 600 MG PO TB12
1200.0000 mg | ORAL_TABLET | Freq: Two times a day (BID) | ORAL | Status: DC
  Administered 2018-05-02 – 2018-05-03 (×3): 1200 mg via ORAL
  Filled 2018-05-02 (×3): qty 2

## 2018-05-02 NOTE — Progress Notes (Signed)
Inpatient Rehabilitation-Admissions Coordinator   [facility] continues attempts to reach out to pt's family to determine DC plans and support required for CIR. If the recommended caregiver support is not available, pt will need SNF.   AC will continue to follow.   Jhonnie Garner, OTR/L  Rehab Admissions Coordinator  [phone] 05/02/2018 3:49 PM

## 2018-05-02 NOTE — Progress Notes (Addendum)
Patient arrived to 2W room 16 at this time. V/s and assessment complete. Patient oriented to room and how to call nurse with any needs. Patient alert to person and place. Patient set up with tele sitter

## 2018-05-02 NOTE — Progress Notes (Addendum)
RT held CPT because it looked like the pt had vomited.  RN stated tube feeds became disconnected and pt did not vomit.  CPT completed

## 2018-05-02 NOTE — Progress Notes (Signed)
PROGRESS NOTE    Paul [name]  PNT:[id number] DOB: 1951-02-19 DOA: 04/21/2018 PCP: Leanna Battles, MD  Brief Narrative: The patient is an 68 yo male with COPD on supplemental O2, CAD s/p stents 2013 presenting after being found confused and dyspneic by family. Profoundly hypoxic in ED. Intubated and found to have RUL pneumonia, strep pneumo bacteremia, and DKA as well as other conditions. He was extubated 04/27/2018 and is improving slowly. Continued to have delirium and agitation after extubation and was placed on a Clonidine Taper. Still has Dysphagia and awaiting further speech recommendations. Currently on Day 12/14 of Abx for his bacteremia.   Assessment & Plan:   Active Problems:   CAP (community acquired pneumonia)   Respiratory failure ([place])   Pressure injury of skin   Coronary artery disease involving native coronary artery of native heart without angina pectoris   Tobacco abuse   Hypertension   Acute blood loss anemia   Bacteremia  Acute on Chronic Hypoxic Respiratory Failure, improving  -Improving. Extubated 1/11 stable on [place]. Secondary to RUL PNA. Blood cultures with Strep Pneumonia,  -RVP +RSV, and Sputum cx with MSSA. - Continue Ceftriaxone for total of 2 weeks, Currently is 12/14 days,tentative stop date January 19. -Wean oxygen as able. -PT is recommending CIR.  Encephalopathy, improving -Still confused some but improving  -Had [place] today  -ContinueClonidine protocol with 0.1 mg Taper and is now 0.1 mg po q12h x 2 doses and 0.1 mg Daily Tomorrow . -C/w IV Haldol 2-4 mg q6h prnAgitation  -Patient is off the Precedex infusion. -Continue to monitor and place on Delirium Precautions   Dysphagia -Failed swallow evaluation, most likely due to altered mental status.  -Cortrak was placed and he was started on tube feed. -Continue tube feeds for now and C/w Osmolite 1.5 CAL per Tube at 55 mL/hr and Pro-Stat Sugar Free 30 mL BID  -Follow with speech  recommendations and have re-consulted -SLP recommended NPO except Ice Chips on 04/30/2018 -Follow up on SLP recommendations   Strep Pneumo Bacteremia -Remains afebrile but has a slight Leukocytosis now  -Continue Ceftriaxone (Day12/14) -Continue to Trend fever curve; Temperature was 97.4 toady  -Repeat blood cultures & resp cultures NGTD at 5 Days  -Continue PT and OT. -WBC slightly worsened and went from 8.4 -> 10.7 -Continue to Monitor for S/Sx of Infection   Mild Hypernatremia/Hyperchloremia -Improving -Na+ is now 147 and Chloride is now 112 -Continue to Monitor and Trend and repeat CMP in AM   Hyperglycemia in the setting of Uncontrolled Diabetes Mellitus Type 2 -Blood sugar elevated with the start of tube feeds. -Levemir 10 unitsBID started by the ICU team,we will titrate accordingly and continue for now -HbA1c was 11.8 -C/w Moderate SSI q4h and Added 3 units Novolog sq q4h -Continue to monitor CBG's carefully -CBG's ranging from 218-279  Constipation -Large BM 1/11 after Lactulose  -C/w BID Senokot 1 tab po BID per Tube  COPD -C/w O2 -C/w Arfomoterol 15 mcg Neb BID and Budesonide 0.5 mg Neb BID -C/w DuoNeb 3 mL TID and Guaifenesin 1200 mg po BID -Repeat CXR in AM   Essential HTN -At Home takes Toprol 50 mg daily but is now on Metoprolol Tartrate 25 mg po BID  -Patient currently n.p.o. on tube feeds.   -Unable to crush the long-acting therefore changed to short-acting beta-blocker. -Home Amlodipine can be restarted if needed. -Continue to Monitor blood pressure closely and adjust medications as needed.   -C/w IV prn Hydralazine 5 mg q4hprn  High Blood Pressure  CAD s/p Stent Placement 2013 -C/w Clopidogrel 75 mg po daily   Normocytic Anemia  -Patient's Hb/Hct went from 11.2/36.0 -> 12.6/40.5 -Check Anemia Panel in the AM  -Continue to Monitor for S/Sx of Bleeding -Repeat CBC in AM   DVT prophylaxis: Enoxaparin 40 mg sq q2h Code Status: FULL CODE    Family Communication: No family present at bedside  Disposition Plan: CIR when medically stable and improved and able to swallow properly   Consultants:   PCCM Transfer  Procedures:  1/5 > Intubated, admit  1/11 > extubated   Antimicrobials Anti-infectives (From admission, onward)   Start     Dose/Rate Route Frequency Ordered Stop   04/27/18 1245  cefTRIAXone (ROCEPHIN) 2 g in sodium chloride 0.9 % 100 mL IVPB     2 g 200 mL/hr over 30 Minutes Intravenous Every 24 hours 04/27/18 1243 05/05/18 2359   04/24/18 1145  ceFAZolin (ANCEF) IVPB 2g/100 mL premix  Status:  Discontinued     2 g 200 mL/hr over 30 Minutes Intravenous Every 8 hours 04/24/18 1144 04/27/18 1243   04/22/18 1500  vancomycin (VANCOCIN) IVPB 1000 mg/200 mL premix  Status:  Discontinued     1,000 mg 200 mL/hr over 60 Minutes Intravenous Every 24 hours 04/21/18 1359 04/21/18 1658   04/22/18 1400  azithromycin (ZITHROMAX) 500 mg in sodium chloride 0.9 % 250 mL IVPB  Status:  Discontinued     500 mg 250 mL/hr over 60 Minutes Intravenous Every 24 hours 04/21/18 1716 04/22/18 1204   04/22/18 1300  cefTRIAXone (ROCEPHIN) 2 g in sodium chloride 0.9 % 100 mL IVPB  Status:  Discontinued     2 g 200 mL/hr over 30 Minutes Intravenous Every 24 hours 04/21/18 1716 04/24/18 1144   04/21/18 1245  cefTRIAXone (ROCEPHIN) 2 g in sodium chloride 0.9 % 100 mL IVPB  Status:  Discontinued     2 g 200 mL/hr over 30 Minutes Intravenous Every 24 hours 04/21/18 1232 04/21/18 1716   04/21/18 1245  azithromycin (ZITHROMAX) 500 mg in sodium chloride 0.9 % 250 mL IVPB  Status:  Discontinued     500 mg 250 mL/hr over 60 Minutes Intravenous Every 24 hours 04/21/18 1232 04/21/18 1716   04/21/18 1245  vancomycin (VANCOCIN) IVPB 1000 mg/200 mL premix  Status:  Discontinued     1,000 mg 200 mL/hr over 60 Minutes Intravenous  Once 04/21/18 1232 04/21/18 1244   04/21/18 1245  vancomycin (VANCOCIN) 1,500 mg in sodium chloride 0.9 % 500 mL IVPB   Status:  Discontinued     1,500 mg 250 mL/hr over 120 Minutes Intravenous  Once 04/21/18 1244 04/21/18 1716     Subjective: Bedside and patient was more awake and alert but he just vomited earlier. Not very verbal but able to shake head yes and no and has No chest pain, lightheadedness or dizziness. Was wanting to get out of the soft mitten restraints.  No other concerns or complaints at this time and awaiting for speech to reevaluate the patient  Objective: Vitals:   05/02/18 0900 05/02/18 1000 05/02/18 1112 05/02/18 1428  BP:  (!) 170/103 (!) 158/92   Pulse: (!) 111 (!) 115 79   Resp: (!) 26 (!) 29    Temp:   (!) 97.4 F (36.3 C)   TempSrc:      SpO2: 96% 95% 99% 94%  Weight:      Height:        Intake/Output Summary (Last 24  hours) at 05/02/2018 1552 Last data filed at 05/02/2018 1000 Gross per 24 hour  Intake 1132.42 ml  Output 350 ml  Net 782.42 ml   Filed Weights   04/30/18 0500 05/01/18 0158 05/02/18 0140  Weight: 80.9 kg 82.4 kg 79.8 kg   Examination: Physical Exam:  Constitutional: Overweight Caucasian ill appearing male in NAD and appears calm but slightly uncomfortable Eyes: Lids and conjunctivae normal, sclerae anicteric  ENMT: External Ears, Nose appear normal. Grossly normal hearing. Has a Cortrak in Right Nare receiving TF Neck: Appears normal, supple, no cervical masses, normal ROM, no appreciable thyromegaly; no JVD Respiratory: Diminished to auscultation bilaterally, no wheezing, rales, rhonchi or crackles. Normal respiratory effort and patient is not tachypenic. No accessory muscle use.  Wearing supplemental O2 Cardiovascular: Slightly tachycardic rate but regular rhythm, no murmurs / rubs / gallops. S1 and S2 auscultated. No extremity edema.   Abdomen: Soft, non-tender, mildly distended. No masses palpated. No appreciable hepatosplenomegaly. Bowel sounds positive x4.  GU: Deferred. Musculoskeletal: No clubbing / cyanosis of digits/nails. No joint  deformity upper and lower extremities.  Skin: No rashes, lesions, ulcers on a limited skin evaluation. No induration; Warm and dry.  Neurologic: CN 2-12 grossly intact with no focal deficits.  Romberg sign and cerebellar reflexes not assessed.  Psychiatric: Normal judgment and insight. Alert and awake. Normal mood and appropriate affect.   Data Reviewed: I have personally reviewed following labs and imaging studies  CBC: Recent Labs  Lab 04/26/18 0555 04/27/18 0655 04/30/18 0452 05/01/18 0343 05/02/18 0545  WBC 10.7* 11.0* 6.0 8.4 10.7*  HGB 10.7* 11.1* 10.6* 11.2* 12.6*  HCT 36.2* 36.5* 34.0* 36.0* 40.5  MCV 98.4 98.4 96.3 98.9 98.8  PLT 178 182 249 283 263   Basic Metabolic Panel: Recent Labs  Lab 04/26/18 0555 04/27/18 0655 04/28/18 0920 04/30/18 0452 05/01/18 0343 05/02/18 0545 05/02/18 0906  NA 145 144 146* 144 148* 147*  --   K 4.3 4.3 3.9 3.5 3.9 3.6  --   CL 116* 115* 117* 114* 115* 112*  --   CO2 22 22 23 24 26 26   --   GLUCOSE 140* 214* 156* 313* 198* 235*  --   BUN 37* 35* 27* 25* 25* 21  --   CREATININE 1.25* 0.96 0.86 0.87 0.83 0.75  --   CALCIUM 7.7* 7.8* 8.0* 7.8* 8.3* 8.7*  --   MG 1.6* 2.0  --  1.9  --   --  1.9  PHOS 3.4 2.6  --  3.3  --   --  3.4   GFR: Estimated Creatinine Clearance: 86.5 mL/min (by C-G formula based on SCr of 0.75 mg/dL). Liver Function Tests: No results for input(s): AST, ALT, ALKPHOS, BILITOT, PROT, ALBUMIN in the last 168 hours. No results for input(s): LIPASE, AMYLASE in the last 168 hours. No results for input(s): AMMONIA in the last 168 hours. Coagulation Profile: No results for input(s): INR, PROTIME in the last 168 hours. Cardiac Enzymes: No results for input(s): CKTOTAL, CKMB, CKMBINDEX, TROPONINI in the last 168 hours. BNP (last 3 results) No results for input(s): PROBNP in the last 8760 hours. HbA1C: Recent Labs    05/02/18 0737  HGBA1C 11.8*   CBG: Recent Labs  Lab 05/01/18 1953 05/01/18 2257  05/02/18 0345 05/02/18 0806 05/02/18 1118  GLUCAP 264* 279* 218* 249* 251*   Lipid Profile: No results for input(s): CHOL, HDL, LDLCALC, TRIG, CHOLHDL, LDLDIRECT in the last 72 hours. Thyroid Function Tests: No results for input(s):  TSH, T4TOTAL, FREET4, T3FREE, THYROIDAB in the last 72 hours. Anemia Panel: No results for input(s): VITAMINB12, FOLATE, FERRITIN, TIBC, IRON, RETICCTPCT in the last 72 hours. Sepsis Labs: No results for input(s): PROCALCITON, LATICACIDVEN in the last 168 hours.  Recent Results (from the past 240 hour(s))  Culture, respiratory (non-expectorated)     Status: None   Collection Time: 04/25/18  8:20 AM  Result Value Ref Range Status   Specimen Description TRACHEAL ASPIRATE  Final   Special Requests NONE  Final   Gram Stain   Final    ABUNDANT WBC PRESENT, PREDOMINANTLY PMN RARE GRAM POSITIVE COCCI Performed at [facility], [address], [place], [place] [address]    Culture RARE CANDIDA ALBICANS  Final   Report Status 04/28/2018 FINAL  Final  Culture, blood (routine x 2)     Status: None   Collection Time: 04/25/18  3:38 PM  Result Value Ref Range Status   Specimen Description BLOOD RIGHT HAND  Final   Special Requests   Final    AEROBIC BOTTLE ONLY Blood Culture results may not be optimal due to an inadequate volume of blood received in culture bottles   Culture   Final    NO GROWTH 5 DAYS Performed at [facility], [place] [address] [place], [place] [address]    Report Status 04/30/2018 FINAL  Final  Culture, blood (routine x 2)     Status: None   Collection Time: 04/25/18  3:38 PM  Result Value Ref Range Status   Specimen Description BLOOD RIGHT HAND  Final   Special Requests   Final    AEROBIC BOTTLE ONLY Blood Culture results may not be optimal due to an inadequate volume of blood received in culture bottles   Culture   Final    NO GROWTH 5 DAYS Performed at [facility], [place] [address], [place],  [address]     Report Status 04/30/2018 FINAL  Final    RN Pressure Injury Documentation: Pressure Injury 04/28/18 Stage I -  Intact skin with non-blanchable redness of a localized area usually over a bony prominence. Red bruise from ETT tube holder (Active)  04/28/18 1920  Location: Face  Location Orientation: Right  Staging: Stage I -  Intact skin with non-blanchable redness of a localized area usually over a bony prominence.  Wound Description (Comments): Red bruise from ETT tube holder  Present on Admission: No   Radiology Studies: No results found.  Scheduled Meds: . arformoterol  15 mcg Nebulization BID  . budesonide (PULMICORT) nebulizer solution  0.5 mg Nebulization BID  . chlorhexidine gluconate (MEDLINE KIT)  15 mL Mouth Rinse BID  . cloNIDine  0.1 mg Oral Q12H   Followed by  . [START ON 05/03/2018] cloNIDine  0.1 mg Oral Daily  . clopidogrel  75 mg Oral Daily  . enoxaparin (LOVENOX) injection  40 mg Subcutaneous Q24H  . feeding supplement (PRO-STAT SUGAR FREE 64)  30 mL Oral BID  . guaiFENesin  1,200 mg Oral BID  . insulin aspart  0-15 Units Subcutaneous Q4H  . insulin aspart  3 Units Subcutaneous Q4H  . insulin detemir  10 Units Subcutaneous BID  . ipratropium-albuterol  3 mL Nebulization TID  . mouth rinse  15 mL Mouth Rinse q12n4p  . metoprolol tartrate  25 mg Oral BID  . senna-docusate  1 tablet Per Tube BID   Continuous Infusions: . sodium chloride Stopped (05/01/18 1316)  . cefTRIAXone (ROCEPHIN)  IV  2 g (05/01/18 1159)  . feeding supplement (OSMOLITE 1.5 CAL) 1,000 mL (05/01/18 0820)    LOS: 11 days   Kerney Elbe, DO [facility] PAGER is on [place]  If 7PM-7AM, please contact night-coverage [email or web address] Password [email or web address] 05/02/2018, 3:52 PM

## 2018-05-03 ENCOUNTER — Other Ambulatory Visit: Payer: Self-pay

## 2018-05-03 ENCOUNTER — Inpatient Hospital Stay (HOSPITAL_COMMUNITY): Payer: Medicare Other

## 2018-05-03 ENCOUNTER — Encounter (HOSPITAL_COMMUNITY): Payer: Self-pay

## 2018-05-03 ENCOUNTER — Inpatient Hospital Stay (HOSPITAL_COMMUNITY)
Admission: RE | Admit: 2018-05-03 | Discharge: 2018-05-22 | DRG: 070 | Disposition: A | Discharge status: Home Health | Payer: Medicare Other | Source: Intra-hospital | Attending: Physical Medicine & Rehabilitation | Admitting: Physical Medicine & Rehabilitation

## 2018-05-03 DIAGNOSIS — I10 Essential (primary) hypertension: Secondary | ICD-10-CM | POA: Diagnosis present

## 2018-05-03 DIAGNOSIS — Z955 Presence of coronary angioplasty implant and graft: Secondary | ICD-10-CM | POA: Diagnosis not present

## 2018-05-03 DIAGNOSIS — E785 Hyperlipidemia, unspecified: Secondary | ICD-10-CM | POA: Diagnosis present

## 2018-05-03 DIAGNOSIS — Z888 Allergy status to other drugs, medicaments and biological substances status: Secondary | ICD-10-CM | POA: Diagnosis not present

## 2018-05-03 DIAGNOSIS — E8809 Other disorders of plasma-protein metabolism, not elsewhere classified: Secondary | ICD-10-CM | POA: Diagnosis present

## 2018-05-03 DIAGNOSIS — G9349 Other encephalopathy: Principal | ICD-10-CM | POA: Diagnosis present

## 2018-05-03 DIAGNOSIS — E1142 Type 2 diabetes mellitus with diabetic polyneuropathy: Secondary | ICD-10-CM | POA: Diagnosis not present

## 2018-05-03 DIAGNOSIS — R5381 Other malaise: Secondary | ICD-10-CM | POA: Diagnosis not present

## 2018-05-03 DIAGNOSIS — K5900 Constipation, unspecified: Secondary | ICD-10-CM | POA: Diagnosis present

## 2018-05-03 DIAGNOSIS — I2510 Atherosclerotic heart disease of native coronary artery without angina pectoris: Secondary | ICD-10-CM | POA: Diagnosis present

## 2018-05-03 DIAGNOSIS — E1169 Type 2 diabetes mellitus with other specified complication: Secondary | ICD-10-CM | POA: Diagnosis not present

## 2018-05-03 DIAGNOSIS — G479 Sleep disorder, unspecified: Secondary | ICD-10-CM | POA: Diagnosis not present

## 2018-05-03 DIAGNOSIS — R1312 Dysphagia, oropharyngeal phase: Secondary | ICD-10-CM | POA: Diagnosis not present

## 2018-05-03 DIAGNOSIS — G9340 Encephalopathy, unspecified: Secondary | ICD-10-CM | POA: Diagnosis present

## 2018-05-03 DIAGNOSIS — J154 Pneumonia due to other streptococci: Secondary | ICD-10-CM | POA: Diagnosis not present

## 2018-05-03 DIAGNOSIS — Z7984 Long term (current) use of oral hypoglycemic drugs: Secondary | ICD-10-CM

## 2018-05-03 DIAGNOSIS — Z8249 Family history of ischemic heart disease and other diseases of the circulatory system: Secondary | ICD-10-CM | POA: Diagnosis not present

## 2018-05-03 DIAGNOSIS — E118 Type 2 diabetes mellitus with unspecified complications: Secondary | ICD-10-CM

## 2018-05-03 DIAGNOSIS — J189 Pneumonia, unspecified organism: Secondary | ICD-10-CM | POA: Diagnosis present

## 2018-05-03 DIAGNOSIS — M109 Gout, unspecified: Secondary | ICD-10-CM | POA: Diagnosis present

## 2018-05-03 DIAGNOSIS — Z825 Family history of asthma and other chronic lower respiratory diseases: Secondary | ICD-10-CM | POA: Diagnosis not present

## 2018-05-03 DIAGNOSIS — Z885 Allergy status to narcotic agent status: Secondary | ICD-10-CM

## 2018-05-03 DIAGNOSIS — R9431 Abnormal electrocardiogram [ECG] [EKG]: Secondary | ICD-10-CM

## 2018-05-03 DIAGNOSIS — E1165 Type 2 diabetes mellitus with hyperglycemia: Secondary | ICD-10-CM | POA: Diagnosis not present

## 2018-05-03 DIAGNOSIS — R0989 Other specified symptoms and signs involving the circulatory and respiratory systems: Secondary | ICD-10-CM

## 2018-05-03 DIAGNOSIS — E46 Unspecified protein-calorie malnutrition: Secondary | ICD-10-CM

## 2018-05-03 DIAGNOSIS — Z6830 Body mass index (BMI) 30.0-30.9, adult: Secondary | ICD-10-CM | POA: Diagnosis not present

## 2018-05-03 DIAGNOSIS — Z9981 Dependence on supplemental oxygen: Secondary | ICD-10-CM | POA: Diagnosis not present

## 2018-05-03 DIAGNOSIS — F17210 Nicotine dependence, cigarettes, uncomplicated: Secondary | ICD-10-CM | POA: Diagnosis present

## 2018-05-03 DIAGNOSIS — G4733 Obstructive sleep apnea (adult) (pediatric): Secondary | ICD-10-CM | POA: Diagnosis present

## 2018-05-03 DIAGNOSIS — R21 Rash and other nonspecific skin eruption: Secondary | ICD-10-CM | POA: Diagnosis not present

## 2018-05-03 DIAGNOSIS — Z79899 Other long term (current) drug therapy: Secondary | ICD-10-CM

## 2018-05-03 DIAGNOSIS — R471 Dysarthria and anarthria: Secondary | ICD-10-CM | POA: Diagnosis present

## 2018-05-03 DIAGNOSIS — E669 Obesity, unspecified: Secondary | ICD-10-CM | POA: Diagnosis present

## 2018-05-03 DIAGNOSIS — R7881 Bacteremia: Secondary | ICD-10-CM | POA: Diagnosis present

## 2018-05-03 DIAGNOSIS — B379 Candidiasis, unspecified: Secondary | ICD-10-CM | POA: Diagnosis not present

## 2018-05-03 DIAGNOSIS — R1310 Dysphagia, unspecified: Secondary | ICD-10-CM | POA: Diagnosis not present

## 2018-05-03 DIAGNOSIS — N183 Chronic kidney disease, stage 3 unspecified: Secondary | ICD-10-CM

## 2018-05-03 DIAGNOSIS — D62 Acute posthemorrhagic anemia: Secondary | ICD-10-CM | POA: Diagnosis not present

## 2018-05-03 DIAGNOSIS — R7309 Other abnormal glucose: Secondary | ICD-10-CM | POA: Diagnosis not present

## 2018-05-03 LAB — CBC WITH DIFFERENTIAL/PLATELET
Abs Immature Granulocytes: 0.1 10*3/uL — ABNORMAL HIGH (ref 0.00–0.07)
Basophils Absolute: 0.1 10*3/uL (ref 0.0–0.1)
Basophils Relative: 1 %
Eosinophils Absolute: 0.1 10*3/uL (ref 0.0–0.5)
Eosinophils Relative: 1 %
HCT: 40.1 % (ref 39.0–52.0)
Hemoglobin: 12.3 g/dL — ABNORMAL LOW (ref 13.0–17.0)
Immature Granulocytes: 1 %
Lymphocytes Relative: 17 %
Lymphs Abs: 2 10*3/uL (ref 0.7–4.0)
MCH: 30.3 pg (ref 26.0–34.0)
MCHC: 30.7 g/dL (ref 30.0–36.0)
MCV: 98.8 fL (ref 80.0–100.0)
Monocytes Absolute: 0.5 10*3/uL (ref 0.1–1.0)
Monocytes Relative: 4 %
Neutro Abs: 8.7 10*3/uL — ABNORMAL HIGH (ref 1.7–7.7)
Neutrophils Relative %: 76 %
Platelets: 272 10*3/uL (ref 150–400)
RBC: 4.06 MIL/uL — ABNORMAL LOW (ref 4.22–5.81)
RDW: 14.4 % (ref 11.5–15.5)
WBC: 11.4 10*3/uL — ABNORMAL HIGH (ref 4.0–10.5)
nRBC: 0 % (ref 0.0–0.2)

## 2018-05-03 LAB — COMPREHENSIVE METABOLIC PANEL
ALT: 47 U/L — ABNORMAL HIGH (ref 0–44)
AST: 63 U/L — ABNORMAL HIGH (ref 15–41)
Albumin: 2.2 g/dL — ABNORMAL LOW (ref 3.5–5.0)
Alkaline Phosphatase: 99 U/L (ref 38–126)
Anion gap: 9 (ref 5–15)
BUN: 20 mg/dL (ref 8–23)
CO2: 26 mmol/L (ref 22–32)
Calcium: 8.5 mg/dL — ABNORMAL LOW (ref 8.9–10.3)
Chloride: 111 mmol/L (ref 98–111)
Creatinine, Ser: 0.83 mg/dL (ref 0.61–1.24)
GFR calc Af Amer: >60 mL/min (ref >60)
GFR calc non Af Amer: >60 mL/min (ref >60)
Glucose, Bld: 224 mg/dL — ABNORMAL HIGH (ref 70–99)
Potassium: 4.4 mmol/L (ref 3.5–5.1)
Sodium: 146 mmol/L — ABNORMAL HIGH (ref 135–145)
Total Bilirubin: 0.8 mg/dL (ref 0.3–1.2)
Total Protein: 6.4 g/dL — ABNORMAL LOW (ref 6.5–8.1)

## 2018-05-03 LAB — GLUCOSE, CAPILLARY
Glucose-Capillary: 114 mg/dL — ABNORMAL HIGH (ref 70–99)
Glucose-Capillary: 164 mg/dL — ABNORMAL HIGH (ref 70–99)
Glucose-Capillary: 171 mg/dL — ABNORMAL HIGH (ref 70–99)
Glucose-Capillary: 203 mg/dL — ABNORMAL HIGH (ref 70–99)
Glucose-Capillary: 206 mg/dL — ABNORMAL HIGH (ref 70–99)
Glucose-Capillary: 237 mg/dL — ABNORMAL HIGH (ref 70–99)
Glucose-Capillary: 290 mg/dL — ABNORMAL HIGH (ref 70–99)

## 2018-05-03 LAB — MAGNESIUM: Magnesium: 1.8 mg/dL (ref 1.7–2.4)

## 2018-05-03 LAB — PHOSPHORUS: Phosphorus: 4 mg/dL (ref 2.5–4.6)

## 2018-05-03 MED ORDER — ENOXAPARIN SODIUM 40 MG/0.4ML ~~LOC~~ SOLN: 40.0000 mg | SUBCUTANEOUS | Status: DC

## 2018-05-03 MED ORDER — ALBUTEROL SULFATE (2.5 MG/3ML) 0.083% IN NEBU
2.5000 mg | INHALATION_SOLUTION | RESPIRATORY_TRACT | Status: DC | PRN
  Administered 2018-05-07: 2.5 mg via RESPIRATORY_TRACT
  Filled 2018-05-03 (×2): qty 3

## 2018-05-03 MED ORDER — IPRATROPIUM-ALBUTEROL 0.5-2.5 (3) MG/3ML IN SOLN: 3.0000 mL | Freq: Three times a day (TID) | RESPIRATORY_TRACT | Status: DC

## 2018-05-03 MED ORDER — METOPROLOL TARTRATE 25 MG PO TABS
25.0000 mg | ORAL_TABLET | Freq: Two times a day (BID) | ORAL | Status: DC
  Administered 2018-05-03 – 2018-05-22 (×38): 25 mg via ORAL
  Filled 2018-05-03 (×38): qty 1

## 2018-05-03 MED ORDER — ACETAMINOPHEN 160 MG/5ML PO SOLN: 650.0000 mg | ORAL | 0 refills | Status: DC | PRN

## 2018-05-03 MED ORDER — SENNOSIDES-DOCUSATE SODIUM 8.6-50 MG PO TABS: 1.0000 | ORAL_TABLET | Freq: Two times a day (BID) | ORAL | Status: DC

## 2018-05-03 MED ORDER — CLOPIDOGREL BISULFATE 75 MG PO TABS: 75.0000 mg | ORAL_TABLET | Freq: Every day | ORAL | Status: DC

## 2018-05-03 MED ORDER — OSMOLITE 1.5 CAL PO LIQD
1000.0000 mL | ORAL | Status: DC
  Administered 2018-05-03 – 2018-05-04 (×2): 1000 mL
  Filled 2018-05-03 (×2): qty 1000

## 2018-05-03 MED ORDER — ACETAMINOPHEN 160 MG/5ML PO SOLN
650.0000 mg | ORAL | Status: DC | PRN
  Administered 2018-05-04 – 2018-05-22 (×23): 650 mg via ORAL
  Filled 2018-05-03 (×24): qty 20.3

## 2018-05-03 MED ORDER — INSULIN ASPART 100 UNIT/ML ~~LOC~~ SOLN
3.0000 [IU] | SUBCUTANEOUS | Status: DC
  Administered 2018-05-03 – 2018-05-04 (×5): 3 [IU] via SUBCUTANEOUS

## 2018-05-03 MED ORDER — POLYVINYL ALCOHOL 1.4 % OP SOLN
1.0000 [drp] | Freq: Three times a day (TID) | OPHTHALMIC | Status: DC | PRN
  Filled 2018-05-03: qty 15

## 2018-05-03 MED ORDER — INSULIN DETEMIR 100 UNIT/ML ~~LOC~~ SOLN
10.0000 [IU] | Freq: Two times a day (BID) | SUBCUTANEOUS | Status: DC
  Administered 2018-05-03 – 2018-05-04 (×2): 10 [IU] via SUBCUTANEOUS
  Filled 2018-05-03 (×2): qty 0.1

## 2018-05-03 MED ORDER — GUAIFENESIN ER 600 MG PO TB12
1200.0000 mg | ORAL_TABLET | Freq: Two times a day (BID) | ORAL | Status: DC
  Filled 2018-05-03: qty 2

## 2018-05-03 MED ORDER — ARFORMOTEROL TARTRATE 15 MCG/2ML IN NEBU: 15.0000 ug | INHALATION_SOLUTION | Freq: Two times a day (BID) | RESPIRATORY_TRACT | Status: DC

## 2018-05-03 MED ORDER — POLYVINYL ALCOHOL 1.4 % OP SOLN: 1.0000 [drp] | Freq: Three times a day (TID) | OPHTHALMIC | 0 refills | Status: DC | PRN

## 2018-05-03 MED ORDER — GUAIFENESIN 100 MG/5ML PO SOLN
10.0000 mL | Freq: Four times a day (QID) | ORAL | Status: DC
  Administered 2018-05-03 – 2018-05-20 (×66): 200 mg via ORAL
  Filled 2018-05-03 (×4): qty 10
  Filled 2018-05-03: qty 20
  Filled 2018-05-03 (×7): qty 10
  Filled 2018-05-03: qty 20
  Filled 2018-05-03 (×48): qty 10
  Filled 2018-05-03: qty 100
  Filled 2018-05-03 (×6): qty 10

## 2018-05-03 MED ORDER — INSULIN ASPART 100 UNIT/ML ~~LOC~~ SOLN
0.0000 [IU] | SUBCUTANEOUS | Status: DC
  Administered 2018-05-03 (×2): 5 [IU] via SUBCUTANEOUS
  Administered 2018-05-04 (×3): 3 [IU] via SUBCUTANEOUS

## 2018-05-03 MED ORDER — SODIUM CHLORIDE 0.9 % IV SOLN: 2.0000 g | INTRAVENOUS | Status: DC

## 2018-05-03 MED ORDER — SODIUM CHLORIDE 0.9 % IV SOLN
2.0000 g | INTRAVENOUS | Status: AC
  Administered 2018-05-04 – 2018-05-05 (×2): 2 g via INTRAVENOUS
  Filled 2018-05-03 (×2): qty 20

## 2018-05-03 MED ORDER — CLOPIDOGREL BISULFATE 75 MG PO TABS
75.0000 mg | ORAL_TABLET | Freq: Every day | ORAL | Status: DC
  Administered 2018-05-04 – 2018-05-22 (×19): 75 mg via ORAL
  Filled 2018-05-03 (×19): qty 1

## 2018-05-03 MED ORDER — IPRATROPIUM-ALBUTEROL 0.5-2.5 (3) MG/3ML IN SOLN
3.0000 mL | Freq: Three times a day (TID) | RESPIRATORY_TRACT | Status: DC
  Administered 2018-05-03 – 2018-05-08 (×13): 3 mL via RESPIRATORY_TRACT
  Filled 2018-05-03 (×15): qty 3

## 2018-05-03 MED ORDER — GUAIFENESIN ER 600 MG PO TB12: 1200.0000 mg | ORAL_TABLET | Freq: Two times a day (BID) | ORAL | Status: DC

## 2018-05-03 MED ORDER — INSULIN DETEMIR 100 UNIT/ML ~~LOC~~ SOLN: 10.0000 [IU] | Freq: Two times a day (BID) | SUBCUTANEOUS | 11 refills | Status: DC

## 2018-05-03 MED ORDER — ORAL CARE MOUTH RINSE: 15.0000 mL | Freq: Two times a day (BID) | OROMUCOSAL | 0 refills | Status: DC

## 2018-05-03 MED ORDER — INSULIN ASPART 100 UNIT/ML ~~LOC~~ SOLN: 0.0000 [IU] | SUBCUTANEOUS | 11 refills | Status: DC

## 2018-05-03 MED ORDER — METOPROLOL TARTRATE 25 MG PO TABS: 25.0000 mg | ORAL_TABLET | Freq: Two times a day (BID) | ORAL | Status: DC

## 2018-05-03 MED ORDER — PRO-STAT SUGAR FREE PO LIQD: 30.0000 mL | Freq: Two times a day (BID) | ORAL | 0 refills | Status: DC

## 2018-05-03 MED ORDER — BUDESONIDE 0.5 MG/2ML IN SUSP: 0.5000 mg | Freq: Two times a day (BID) | RESPIRATORY_TRACT | 12 refills | Status: DC

## 2018-05-03 MED ORDER — SENNOSIDES-DOCUSATE SODIUM 8.6-50 MG PO TABS
1.0000 | ORAL_TABLET | Freq: Two times a day (BID) | ORAL | Status: DC
  Administered 2018-05-03 – 2018-05-22 (×37): 1
  Filled 2018-05-03 (×37): qty 1

## 2018-05-03 MED ORDER — ORAL CARE MOUTH RINSE
15.0000 mL | Freq: Two times a day (BID) | OROMUCOSAL | Status: DC
  Administered 2018-05-04 – 2018-05-14 (×13): 15 mL via OROMUCOSAL

## 2018-05-03 MED ORDER — ALBUTEROL SULFATE (2.5 MG/3ML) 0.083% IN NEBU: 2.5000 mg | INHALATION_SOLUTION | RESPIRATORY_TRACT | 12 refills | Status: DC | PRN

## 2018-05-03 MED ORDER — ENOXAPARIN SODIUM 40 MG/0.4ML ~~LOC~~ SOLN
40.0000 mg | SUBCUTANEOUS | Status: DC
  Administered 2018-05-03: 40 mg via SUBCUTANEOUS
  Filled 2018-05-03: qty 0.4

## 2018-05-03 MED ORDER — OSMOLITE 1.5 CAL PO LIQD: 1000.0000 mL | ORAL | 0 refills | Status: DC

## 2018-05-03 MED ORDER — ARFORMOTEROL TARTRATE 15 MCG/2ML IN NEBU
15.0000 ug | INHALATION_SOLUTION | Freq: Two times a day (BID) | RESPIRATORY_TRACT | Status: DC
  Administered 2018-05-03 – 2018-05-22 (×31): 15 ug via RESPIRATORY_TRACT
  Filled 2018-05-03 (×45): qty 2

## 2018-05-03 MED ORDER — BUDESONIDE 0.5 MG/2ML IN SUSP
0.5000 mg | Freq: Two times a day (BID) | RESPIRATORY_TRACT | Status: DC
  Administered 2018-05-03 – 2018-05-22 (×33): 0.5 mg via RESPIRATORY_TRACT
  Filled 2018-05-03 (×43): qty 2

## 2018-05-03 NOTE — Progress Notes (Signed)
Inpatient Rehabilitation-Admissions Coordinator   [facility] has spoken with pt's daughter and brother regarding social support recommended after CIR. Both feel they will be able to provide level of care. Pt has agreed to CIR. AC has received medical approval for CIR today. RN and CM aware of plan.  Please call if questions.   Jhonnie Garner, OTR/L  Rehab Admissions Coordinator  [phone] 05/03/2018 2:49 PM

## 2018-05-03 NOTE — Progress Notes (Signed)
Paul Arn, MD  Physician  Physical Medicine and Rehabilitation  Consult Note  Signed  Date of Service:  04/30/2018 10:10 AM       Related encounter: ED to Hosp-Admission (Discharged) from 04/21/2018 in [facility]      Signed      Expand All Collapse All         Physical Medicine and Rehabilitation Consult Reason for Consult:  Decreased functional mobility Referring Physician: critical care   HPI: Paul [name] is a 68 y.o.right handed male  With history of CAD with angioplasty, COPD/tobacco abuse., hypertension. Per chart review, patient lives alone reportedly independent prior to admission. Present 04/21/2018 with increasing shortness of breath and altered mental status. Chest x-ray indicated of right upper lobe pneumonia and profound hypoxemia. Patient did require intubation. Hospital course complicated by strep pneumo bacteremia. Patient was extubated 04/27/2018. Currently remains on ceftriaxone 2 weeks. Patient did require Precedex due to delirium agitation. Subcutaneous Lovenox for DVT prophylaxis. Patient is NPO with alternative means of nutritional support. Therapy evaluations completed. M.D. has requested physical medicine rehabilitation consult.   Review of Systems  Unable to perform ROS: Acuity of condition       Past Medical History:  Diagnosis Date  . CAD S/P percutaneous coronary angioplasty -- D1, Xience Xpedition DES 2.5 mm x 33 mm 11/24/2011   a) Mild-to-moderate 30-40% lesions in the RCA, LAD and Circumflex. b) CULPRIT LESION: long tubular 70-80% lesion in D1 with FFR of 0.7 --> PCI w/ Xience Xpedition DES 2.5 mm x 30 mm (2.65 MM); c) Lexiscan Myoview 11/2013: No Ischemia or Infarct (Inferior Gut Attenuation) EF 63%.  Marland Kitchen COPD (chronic obstructive pulmonary disease) ([place])    "don't have full case of it; I'm right there at it"  . Essential hypertension 11/24/2011  . Exertional dyspnea, chronic   . Gout   . History of  Unstable angina 11/24/2011   Referred for cardiac catheterization  . Hyperlipidemia with target LDL less than 70 11/24/2011  . Obesity (BMI 30.0-34.9)   . OSA (obstructive sleep apnea), uses oxygen at home did not tolerate cpap 11/24/2011        Past Surgical History:  Procedure Laterality Date  . CERVICAL SPINE SURGERY  2012  . CORONARY ANGIOPLASTY WITH STENT PLACEMENT  11/23/2011   "1; first one"  . LEFT HEART CATHETERIZATION WITH CORONARY ANGIOGRAM N/A 11/23/2011   Procedure: LEFT HEART CATHETERIZATION WITH CORONARY ANGIOGRAM;  Surgeon: Leonie Man, MD;  Location: [facility] C[facility] LAB;  Service: Cardiovascular;  Laterality: N/A;        Family History  Problem Relation Age of Onset  . Heart disease Sister   . Heart attack Brother   . Emphysema Father        smoked  . Aneurysm Father    Social History:  reports that he has been smoking cigarettes. He has a 40.00 pack-year smoking history. He has never used smokeless tobacco. He reports current alcohol use. He reports that he does not use drugs. Allergies:       Allergies  Allergen Reactions  . Codeine Nausea And Vomiting  . Duloxetine Hcl Other (See Comments)    Took at night and still felt very lethargic the following day.  (malaise)         Medications Prior to Admission  Medication Sig Dispense Refill  . amLODipine (NORVASC) 5 MG tablet Take 5 mg by mouth daily.    . colchicine (COLCRYS) 0.6 MG tablet Take  two tabs by mouth at once then a third tablet one hour later. (Patient taking differently: Take 0.6 mg by mouth daily. Take two tabs by mouth at once then a third tablet one hour later.) 3 tablet 0  . DULoxetine (CYMBALTA) 30 MG capsule Take 30 mg by mouth daily.  3  . esomeprazole (NEXIUM) 20 MG capsule Take 20 mg by mouth daily at 12 noon.    . Fluticasone-Salmeterol (ADVAIR DISKUS) 250-50 MCG/DOSE AEPB Inhale 1 puff into the lungs 2 (two) times daily.    Marland Kitchen gabapentin (NEURONTIN) 300 MG capsule Take 300 mg by  mouth at bedtime.  1  . ibuprofen (ADVIL,MOTRIN) 200 MG tablet Take 200 mg by mouth every 6 (six) hours as needed for mild pain.    . metFORMIN (GLUCOPHAGE-XR) 500 MG 24 hr tablet Take 500 mg by mouth daily with supper.  3  . metoprolol succinate (TOPROL-XL) 25 MG 24 hr tablet Take 50 mg by mouth daily.  12  . tiotropium (SPIRIVA HANDIHALER) 18 MCG inhalation capsule Place 18 mcg into inhaler and inhale daily.    Marland Kitchen zolpidem (AMBIEN) 10 MG tablet Take 10 mg by mouth at bedtime as needed for sleep.     . nitroGLYCERIN (NITROSTAT) 0.4 MG SL tablet Place 1 tablet (0.4 mg total) under the tongue every 5 (five) minutes as needed for chest pain. (Patient not taking: Reported on 04/22/2018) 25 tablet 4    Home: [place] expects to be discharged to:: Inpatient rehab  Functional History: Prior Function Level of Independence: Independent Comments: lives at home alone rides motorcylces, per family not the best diet or activity level and has had some leg weakness as of late. indepenent with ADLs Functional Status:  Mobility: Bed Mobility Overal bed mobility: Needs Assistance Bed Mobility: Supine to Sit, Sit to Supine Supine to sit: Mod assist, +2 for physical assistance Sit to supine: Mod assist, +2 for physical assistance General bed mobility comments: A for legs and trunk with support throughout due to patient unable or too restless to keep static sitting balance. Transfers Overall transfer level: Needs assistance Equipment used: 2 person hand held assist Transfers: Sit to/from Stand Sit to Stand: Mod assist, +2 physical assistance General transfer comment: with use of gait belt--partial stand Mod A +2 with Max A +2 for shifting hips to left x 5 to get up in bed, performed x4 times.  Ambulation/Gait General Gait Details: NOt able.  ADL: ADL Overall ADL's : Needs assistance/impaired Eating/Feeding: NPO Grooming: Moderate assistance Grooming Details (indicate cue  type and reason): supported sitting Upper Body Bathing: Maximal assistance Upper Body Bathing Details (indicate cue type and reason): supported sitting Lower Body Bathing: Total assistance Lower Body Bathing Details (indicate cue type and reason): Mod A+2 partial sit>stand Upper Body Dressing : Total assistance Upper Body Dressing Details (indicate cue type and reason): supported sitting Lower Body Dressing: Total assistance Lower Body Dressing Details (indicate cue type and reason): Mod A+2 partial sit>stand Toilet Transfer: Maximal assistance, +2 for physical assistance Toilet Transfer Details (indicate cue type and reason): partial stand>shift hips up in bed x5 Toileting- Clothing Manipulation and Hygiene: Total assistance Toileting - Clothing Manipulation Details (indicate cue type and reason): Mod A+2 partial sit>stand  Cognition: Cognition Overall Cognitive Status: Impaired/Different from baseline Orientation Level: Disoriented to person, Oriented to place, Disoriented to time, Disoriented to situation Cognition Arousal/Alertness: Awake/alert Behavior During Therapy: Restless, Impulsive Overall Cognitive Status: Impaired/Different from baseline Area of Impairment: Orientation, Following commands, Safety/judgement, Problem  solving Orientation Level: Disoriented to, Place, Situation(knows it is 2020 ) Following Commands: Follows one step commands inconsistently, Follows one step commands with increased time Safety/Judgement: Decreased awareness of safety, Decreased awareness of deficits Problem Solving: Decreased initiation, Requires verbal cues, Requires tactile cues General Comments: He was all over the place while seated EOB--difficulty maintaining static balance. No awareness of safety/deficits. Perseverating on wanting to drink water despite beingtold multiple times he is now allowed.   Blood pressure (!) 164/78, pulse (!) 103, temperature 99 F (37.2 C), temperature source  Oral, resp. rate 20, height 5\' 6"  (1.676 m), weight 80.9 kg, SpO2 96 %. Physical Exam  Vitals reviewed. Constitutional: He appears well-developed and well-nourished.  B/l UE restraints  HENT:  Head: Normocephalic.  Nasogastric tube in place Abrasions to forehead  Eyes: Right eye exhibits no discharge. Left eye exhibits no discharge.  Pupils reactive to light  Neck: Normal range of motion. Neck supple. No thyromegaly present.  Cardiovascular: Normal rate and regular rhythm.  Respiratory:  Limited inspiratory effort but clear to auscultation  GI: Soft. Bowel sounds are normal.  Musculoskeletal:     Comments: No edema or tenderness in extremities  Neurological: He is alert.  Patient a bit lethargic but arousable.  Makes eye contact with examiner.  Bilateral mittens in place for patient safety. Not following any commands, moving b/l UE spontaneously  Skin: Skin is warm and dry.  Psychiatric:  Unable to assess due to mentation    LabResultsLast24Hours       Results for orders placed or performed during the hospital encounter of 04/21/18 (from the past 24 hour(s))  Glucose, capillary     Status: Abnormal   Collection Time: 04/29/18 11:40 AM  Result Value Ref Range   Glucose-Capillary 158 (H) 70 - 99 mg/dL  Glucose, capillary     Status: Abnormal   Collection Time: 04/29/18  3:51 PM  Result Value Ref Range   Glucose-Capillary 220 (H) 70 - 99 mg/dL  Glucose, capillary     Status: Abnormal   Collection Time: 04/29/18  8:17 PM  Result Value Ref Range   Glucose-Capillary 263 (H) 70 - 99 mg/dL  Glucose, capillary     Status: Abnormal   Collection Time: 04/29/18 11:48 PM  Result Value Ref Range   Glucose-Capillary 267 (H) 70 - 99 mg/dL  Glucose, capillary     Status: Abnormal   Collection Time: 04/30/18  4:06 AM  Result Value Ref Range   Glucose-Capillary 296 (H) 70 - 99 mg/dL  CBC     Status: Abnormal   Collection Time: 04/30/18  4:52 AM  Result Value Ref  Range   WBC 6.0 4.0 - 10.5 K/uL   RBC 3.53 (L) 4.22 - 5.81 MIL/uL   Hemoglobin 10.6 (L) 13.0 - 17.0 g/dL   HCT 34.0 (L) 39.0 - 52.0 %   MCV 96.3 80.0 - 100.0 fL   MCH 30.0 26.0 - 34.0 pg   MCHC 31.2 30.0 - 36.0 g/dL   RDW 13.7 11.5 - 15.5 %   Platelets 249 150 - 400 K/uL   nRBC 0.0 0.0 - 0.2 %  Basic metabolic panel     Status: Abnormal   Collection Time: 04/30/18  4:52 AM  Result Value Ref Range   Sodium 144 135 - 145 mmol/L   Potassium 3.5 3.5 - 5.1 mmol/L   Chloride 114 (H) 98 - 111 mmol/L   CO2 24 22 - 32 mmol/L   Glucose, Bld 313 (H) 70 -  99 mg/dL   BUN 25 (H) 8 - 23 mg/dL   Creatinine, Ser 0.87 0.61 - 1.24 mg/dL   Calcium 7.8 (L) 8.9 - 10.3 mg/dL   GFR calc non Af Amer >60 >60 mL/min   GFR calc Af Amer >60 >60 mL/min   Anion gap 6 5 - 15  Magnesium     Status: None   Collection Time: 04/30/18  4:52 AM  Result Value Ref Range   Magnesium 1.9 1.7 - 2.4 mg/dL  Phosphorus     Status: None   Collection Time: 04/30/18  4:52 AM  Result Value Ref Range   Phosphorus 3.3 2.5 - 4.6 mg/dL  Glucose, capillary     Status: Abnormal   Collection Time: 04/30/18  8:01 AM  Result Value Ref Range   Glucose-Capillary 273 (H) 70 - 99 mg/dL     ImagingResults(Last48hours)  No results found.    Assessment/Plan: Diagnosis: Encephalopathy Labs independently reviewed.  Records reviewed and summated above.  1. Does the need for close, 24 hr/day medical supervision in concert with the patient's rehab needs make it unreasonable for this patient to be served in a less intensive setting? Yes  2. Co-Morbidities requiring supervision/potential complications: CAD with angioplasty (cont meds), COPD/tobacco abuse (monitor RR and O2 sats with increased mobility), HTN (monitor and provide prns in accordance with increased physical exertion and pain), bacteremia (cont IV abx per recs), ABLA (repeat labs, transfuse to ensure appropriate perfusion for increased activity  tolerance) 3. Due to safety, skin/wound care, disease management, medication administration and patient education, does the patient require 24 hr/day rehab nursing? Yes 4. Does the patient require coordinated care of a physician, rehab nurse, PT (1-2 hrs/day, 5 days/week), OT (1-2 hrs/day, 5 days/week) and SLP (1-2 hrs/day, 5 days/week) to address physical and functional deficits in the context of the above medical diagnosis(es)? Yes Addressing deficits in the following areas: balance, endurance, locomotion, strength, transferring, bathing, dressing, toileting, cognition, speech, swallowing and psychosocial support 5. Can the patient actively participate in an intensive therapy program of at least 3 hrs of therapy per day at least 5 days per week? Potentially 6. The potential for patient to make measurable gains while on inpatient rehab is excellent 7. Anticipated functional outcomes upon discharge from inpatient rehab are supervision and min assist  with PT, supervision and min assist with OT, supervision and min assist with SLP. 8. Estimated rehab length of stay to reach the above functional goals is: 14-18 days. 9. Anticipated D/C setting: Home 10. Anticipated post D/C treatments: HH therapy and Home excercise program 11. Overall Rehab/Functional Prognosis: good  RECOMMENDATIONS: This patient's condition is appropriate for continued rehabilitative care in the following setting: CIR if caregiver support available upon discharge. Patient has agreed to participate in recommended program. Potentially Note that insurance prior authorization may be required for reimbursement for recommended care.  Comment: Rehab Admissions Coordinator to follow up.   I have personally performed a face to face diagnostic evaluation, including, but not limited to relevant history and physical exam findings, of this patient and developed relevant assessment and plan.  Additionally, I have reviewed and concur with  the physician assistant's documentation above.   Delice Lesch, MD, ABPMR Lavon Paganini Angiulli, PA-C 04/30/2018        Revision History                        Routing History

## 2018-05-03 NOTE — Progress Notes (Signed)
Patient arrived to unit. Son in law present. Oriented to unit. No complaints at this time, resting in chair with telemonitor in place.

## 2018-05-03 NOTE — Progress Notes (Signed)
Modified Barium Swallow Progress Note  Patient Details  Name: Paul [name] MRN: [id number] Date of Birth: 04-12-1951  Today's Date: 05/03/2018  Modified Barium Swallow completed.  Full report located under Chart Review in the Imaging Section.  Brief recommendations include the following:  Clinical Impression  Pt demonstrates severe oral and oropharyngeal dysphagia with dealyed swallow initaition and moderate to severe sensed aspiration events before the swallow with thin and nectar thick liquids.  Cough is not effective to clear.  There is also moderate generalized weakness with vallecular and pyriform residuals which may be more significant due to presennce of NG tube and decreased epiglottic inversion. Chin tuck cannot be fully completed due to present of cervical fusion, though slight forward head posture was better than posterior lean that pt seems to prefer. Pt can tolerate small sips of honey thick liquids and puree with cues for a second swallow. Expect improvement as oral mucosa before better hydrated for better oral control and sensation and when vocal quality and cough strength improved. Would not advise removal of NG tube until tolerance is established.    Swallow Evaluation Recommendations       SLP Diet Recommendations: Dysphagia 1 (Puree) solids;Honey thick liquids   Liquid Administration via: Spoon;Cup   Medication Administration: Crushed with puree       Compensations: Slow rate;Small sips/bites;Multiple dry swallows after each bite/sip       Oral Care Recommendations: Oral care QID   Other Recommendations: Order thickener from pharmacy;Have oral suction available   Herbie [place], MA [place] Pager [phone] Office [phone]  Lynann Beaver 05/03/2018,5:42 PM

## 2018-05-03 NOTE — Progress Notes (Signed)
Physical Therapy Treatment Patient Details Name: Paul [name] MRN: [id number] DOB: January 10, 1951 Today's Date: 05/03/2018    History of Present Illness 68 year old man with COPD on home oxygen admitted 1/5 with right upper lobe community-acquired pneumonia requiring mechanical ventilation. Extubated 1/11. PMH includes but not limited to: OSA, Obesity, COPD, CAD, Gout, HTN, Chronic exertional dyspnea, C spine surgery 2012.     PT Comments    Patient cont to demonstrate progress with therapy, less assistance to sit and tolerating side stepping along bed. Will likely progress to ambulation next visit, agree with plans for CIR.    Follow Up Recommendations  CIR     Equipment Recommendations  None recommended by PT    Recommendations for Other Services Rehab consult     Precautions / Restrictions Precautions Precautions: Fall Restrictions Weight Bearing Restrictions: No    Mobility  Bed Mobility Overal bed mobility: Needs Assistance Bed Mobility: Supine to Sit     Supine to sit: Min assist Sit to supine: Min assist   General bed mobility comments: Min A for bringing legs over EOB and supporting trunk patient initiating more movement on his own today.   Transfers Overall transfer level: Needs assistance Equipment used: 2 person hand held assist Transfers: Sit to/from Stand Sit to Stand: +2 physical assistance;Mod assist         General transfer comment: mod A to stand EOB, side stepping with poor balance today. VSS on 3L  Ambulation/Gait                 Stairs             Wheelchair Mobility    Modified Rankin (Stroke Patients Only)       Balance Overall balance assessment: Needs assistance Sitting-balance support: Single extremity supported;Feet supported Sitting balance-Leahy Scale: Fair Sitting balance - Comments: able to sit with supervision   Standing balance support: Bilateral upper extremity supported;During functional activity Standing  balance-Leahy Scale: Zero Standing balance comment: external support to maintain balance. Not able to stand fully upright,.                             Cognition Arousal/Alertness: Awake/alert Behavior During Therapy: Restless;Impulsive Overall Cognitive Status: Impaired/Different from baseline Area of Impairment: Orientation;Following commands;Safety/judgement;Problem solving                 Orientation Level: Disoriented to;Place;Situation     Following Commands: Follows one step commands inconsistently;Follows one step commands with increased time Safety/Judgement: Decreased awareness of safety;Decreased awareness of deficits   Problem Solving: Decreased initiation;Requires verbal cues;Requires tactile cues General Comments: following cues more consistnetly today      Exercises      General Comments        Pertinent Vitals/Pain Pain Assessment: Faces Faces Pain Scale: No hurt Pain Location: generalized  Pain Descriptors / Indicators: Discomfort;Grimacing Pain Intervention(s): Limited activity within patient's tolerance;Monitored during session    Home Living                      Prior Function            PT Goals (current goals can now be found in the care plan section) Acute Rehab PT Goals Patient Stated Goal: agreeable to OOB to chair PT Goal Formulation: With patient Time For Goal Achievement: 05/12/18 Potential to Achieve Goals: Good Progress towards PT goals: Progressing toward goals    Frequency  Min 3X/week      PT Plan Current plan remains appropriate    Co-evaluation              AM-PAC PT "6 Clicks" Mobility   Outcome Measure  Help needed turning from your back to your side while in a flat bed without using bedrails?: A Lot Help needed moving from lying on your back to sitting on the side of a flat bed without using bedrails?: A Lot Help needed moving to and from a bed to a chair (including a  wheelchair)?: Total Help needed standing up from a chair using your arms (e.g., wheelchair or bedside chair)?: Total Help needed to walk in hospital room?: Total Help needed climbing 3-5 steps with a railing? : Total 6 Click Score: 8    End of Session Equipment Utilized During Treatment: Gait belt Activity Tolerance: Patient tolerated treatment well Patient left: in bed;with call bell/phone within reach;with bed alarm set Nurse Communication: Mobility status PT Visit Diagnosis: Unsteadiness on feet (R26.81)     Time: 8295-6213 PT Time Calculation (min) (ACUTE ONLY): 18 min  Charges:  $Therapeutic Activity: 8-22 mins                    Reinaldo Berber, PT, DPT Acute Rehabilitation Services Pager: [phone] Office: [place] 05/03/2018, 4:31 PM

## 2018-05-03 NOTE — Progress Notes (Signed)
     PMR Admission Coordinator Pre-Admission Assessment  Patient: Paul [name] is an 68 y.o., male MRN: [id number] DOB: 02/21/1951 Height: 5' 6" (167.6 cm) Weight: 79.8 kg                                                                                                                                                  Insurance Information HMO:     PPO:      PCP:      IPA:      80/20: Yes     OTHER:  PRIMARY: Medicare A and B      Policy#: [id number]      Subscriber: Patient CM Name:      Phone#:      Fax#:  Pre-Cert#:       Employer:  Benefits:  Phone #: NA     Name: Verified eligibility via [employer] on 05/03/18  Eff. Date: Part A effective: 02/15/10; Part B effective: 08/16/10     Deduct: $1,408      Out of Pocket Max: NA      Life Max: NA CIR: Covered per Medicare guidelines once yearly deductible is met      SNF: days 1-20, 100%, days 21-100, 80% Outpatient: 80%     Co-Pay: 20% Home Health: 100%      Co-Pay:  DME: 80%     Co-Pay: 20% Providers: Pt's choice  SECONDARY: Generic Commercial      Policy#: [id number]      Subscriber: Patient CM Name:       Phone#:      Fax#:  Pre-Cert#:       Employer:  Benefits:  Phone #: [phone]      Name:  Eff. Date:      Deduct:       Out of Pocket Max:       Life Max:  CIR:       SNF:  Outpatient:      Co-Pay:  Home Health:      Co-Pay:  DME:      Co-Pay:   Medicaid Application Date:       Case Manager:  Disability Application Date:       Case Worker:   Emergency Contact Information         Contact Information    Name Relation Home Work Mobile   Holt, Daughter   [phone]   Mcguiness,Jay Brother   [phone]     Current Medical History  Patient Admitting Diagnosis: Encephalopathy History of Present Illness: Paul [name] is a 68-year-old right-handed male with history of CAD with angioplasty 2013 maintained on Plavix,diabetes mellitus, COPD, tobacco abuse and supplemental oxygen, hypertension. Per chart review,  patient lives alone reportedly independent prior to admission. He does have a daughter in the area. Presented 04/21/2018 with increasing shortness of breath and   altered mental status. Chest x-ray indicated of right upper lobe pneumonia and profound hypoxemia. Patient did require intubation. Hospital course complicated by Blood cultures strep Pneumo bacteremia. Patient was extubated 04/27/2018. Completing a two-week course of ceftriaxone to end on 05/05/2018.repeat blood cultures and respiratory cultures no growth to date. Noted bouts of agitation and restlessness requiring Precedex. Subcutaneous Lovenox for DVT prophylaxis. Patient is NPO with Cortrak tube in place. Therapy evaluations completed with recommendations of physical medicine rehabilitation consult. Patient is to be admitted for a comprehensive rehabilitation program on 05/03/18.  Past Medical History      Past Medical History:  Diagnosis Date  . CAD S/P percutaneous coronary angioplasty -- D1, Xience Xpedition DES 2.5 mm x 33 mm 11/24/2011   a) Mild-to-moderate 30-40% lesions in the RCA, LAD and Circumflex. b) CULPRIT LESION: long tubular 70-80% lesion in D1 with FFR of 0.7 --> PCI w/ Xience Xpedition DES 2.5 mm x 30 mm (2.65 MM); c) Lexiscan Myoview 11/2013: No Ischemia or Infarct (Inferior Gut Attenuation) EF 63%.  . COPD (chronic obstructive pulmonary disease) (HCC)    "don't have full case of it; I'm right there at it"  . Essential hypertension 11/24/2011  . Exertional dyspnea, chronic   . Gout   . History of Unstable angina 11/24/2011   Referred for cardiac catheterization  . Hyperlipidemia with target LDL less than 70 11/24/2011  . Obesity (BMI 30.0-34.9)   . OSA (obstructive sleep apnea), uses oxygen at home did not tolerate cpap 11/24/2011    Family History  family history includes Aneurysm in his father; Emphysema in his father; Heart attack in his brother; Heart disease in his sister.  Prior Rehab/Hospitalizations:    Has the patient had major surgery during 100 days prior to admission? No  Current Medications   Current Facility-Administered Medications:  .  0.9 %  sodium chloride infusion, , Intravenous, PRN, Amin, Sumayya, MD, Stopped at 05/01/18 1316 .  acetaminophen (TYLENOL) solution 650 mg, 650 mg, Oral, Q4H PRN, Amin, Sumayya, MD, 650 mg at 05/01/18 2038 .  albuterol (PROVENTIL) (2.5 MG/3ML) 0.083% nebulizer solution 2.5 mg, 2.5 mg, Nebulization, Q4H PRN, Amin, Sumayya, MD, 2.5 mg at 05/02/18 0532 .  arformoterol (BROVANA) nebulizer solution 15 mcg, 15 mcg, Nebulization, BID, Amin, Sumayya, MD, 15 mcg at 05/03/18 0814 .  budesonide (PULMICORT) nebulizer solution 0.5 mg, 0.5 mg, Nebulization, BID, Amin, Sumayya, MD, 0.5 mg at 05/03/18 0814 .  cefTRIAXone (ROCEPHIN) 2 g in sodium chloride 0.9 % 100 mL IVPB, 2 g, Intravenous, Q24H, Amin, Sumayya, MD, Last Rate: 200 mL/hr at 05/03/18 1239, 2 g at 05/03/18 1239 .  chlorhexidine gluconate (MEDLINE KIT) (PERIDEX) 0.12 % solution 15 mL, 15 mL, Mouth Rinse, BID, Amin, Sumayya, MD, 15 mL at 05/03/18 0959 .  clopidogrel (PLAVIX) tablet 75 mg, 75 mg, Oral, Daily, Amin, Sumayya, MD, 75 mg at 05/03/18 0952 .  enoxaparin (LOVENOX) injection 40 mg, 40 mg, Subcutaneous, Q24H, Amin, Sumayya, MD, 40 mg at 05/02/18 2257 .  feeding supplement (OSMOLITE 1.5 CAL) liquid 1,000 mL, 1,000 mL, Per Tube, Continuous, Amin, Sumayya, MD, Last Rate: 55 mL/hr at 05/01/18 0820, 1,000 mL at 05/01/18 0820 .  feeding supplement (PRO-STAT SUGAR FREE 64) liquid 30 mL, 30 mL, Oral, BID, Amin, Sumayya, MD, 30 mL at 05/03/18 0951 .  guaiFENesin (MUCINEX) 12 hr tablet 1,200 mg, 1,200 mg, Oral, BID, Sheikh, Omair Latif, DO, 1,200 mg at 05/03/18 0952 .  haloperidol lactate (HALDOL) injection 2-4 mg, 2-4 mg, Intravenous, Q6H PRN, Amin,   Sumayya, MD, 2 mg at 05/01/18 2125 .  hydrALAZINE (APRESOLINE) injection 5 mg, 5 mg, Intravenous, Q4H PRN, Lorella Nimrod, MD, 5 mg at 05/02/18 0014 .  insulin  aspart (novoLOG) injection 0-15 Units, 0-15 Units, Subcutaneous, Q4H, Lorella Nimrod, MD, 3 Units at 05/03/18 1235 .  insulin aspart (novoLOG) injection 3 Units, 3 Units, Subcutaneous, Q4H, Raiford Noble [place], [place], 3 Units at 05/03/18 1236 .  insulin detemir (LEVEMIR) injection 10 Units, 10 Units, Subcutaneous, BID, Lorella Nimrod, MD, 10 Units at 05/03/18 0953 .  ipratropium-albuterol (DUONEB) 0.5-2.5 (3) MG/3ML nebulizer solution 3 mL, 3 mL, Nebulization, TID, Matcha, Anupama, MD, 3 mL at 05/03/18 0814 .  MEDLINE mouth rinse, 15 mL, Mouth Rinse, q12n4p, Lorella Nimrod, MD, 15 mL at 05/03/18 1236 .  metoprolol tartrate (LOPRESSOR) tablet 25 mg, 25 mg, Oral, BID, Matcha, Anupama, MD, 25 mg at 05/03/18 0952 .  ondansetron (ZOFRAN) injection 4 mg, 4 mg, Intravenous, Q6H PRN, Lorella Nimrod, MD .  polyvinyl alcohol (LIQUIFILM TEARS) 1.4 % ophthalmic solution 1 drop, 1 drop, Both Eyes, TID PRN, Lorella Nimrod, MD, 1 drop at 04/26/18 2211 .  senna-docusate (Senokot-S) tablet 1 tablet, 1 tablet, Per Tube, BID, Lorella Nimrod, MD, 1 tablet at 05/03/18 2336  Patients Current Diet:     Diet Order                  DIET - DYS 1 Room service appropriate? Yes; Fluid consistency: Honey Thick  Diet effective now               Precautions / Restrictions Precautions Precautions: Fall Precaution Comments: posey belt restraints and bil mitts Restrictions Weight Bearing Restrictions: No   Has the patient had 2 or more falls or a fall with injury in the past year?No  Prior Activity Level Community (5-7x/wk): active but retired; worked in the [employer]; Independent PTA  Home Assistive Devices / Equipment  Prior Device Use: Indicate devices/aids used by the patient prior to current illness, exacerbation or injury? None of the above  Prior Functional Level Prior Function Level of Independence: Independent Comments: lives at home alone rides motorcylces, per family not the best  diet or activity level and has had some leg weakness as of late. indepenent with ADLs  Self Care: Did the patient need help bathing, dressing, using the toilet or eating?  Independent  Indoor Mobility: Did the patient need assistance with walking from room to room (with or without device)? Independent  Stairs: Did the patient need assistance with internal or external stairs (with or without device)? Independent  Functional Cognition: Did the patient need help planning regular tasks such as shopping or remembering to take medications? Needed some help  Current Functional Level Cognition  Overall Cognitive Status: Impaired/Different from baseline Orientation Level: Oriented to person Following Commands: Follows one step commands inconsistently, Follows one step commands with increased time Safety/Judgement: Decreased awareness of safety, Decreased awareness of deficits General Comments: following cues more consistnetly today    Extremity Assessment (includes Sensation/Coordination)  Upper Extremity Assessment: Generalized weakness  Lower Extremity Assessment: Defer to PT evaluation    ADLs  Overall ADL's : Needs assistance/impaired Eating/Feeding: Maximal assistance, Sitting Eating/Feeding Details (indicate cue type and reason): ice chips Grooming: Maximal assistance, Sitting, Wash/dry face Grooming Details (indicate cue type and reason): supported sitting Upper Body Bathing: Maximal assistance Upper Body Bathing Details (indicate cue type and reason): supported sitting Lower Body Bathing: Total assistance Lower Body Bathing Details (indicate cue type and reason):  Mod A+2 partial sit>stand Upper Body Dressing : Total assistance Upper Body Dressing Details (indicate cue type and reason): supported sitting Lower Body Dressing: Total assistance, Bed level Lower Body Dressing Details (indicate cue type and reason): Mod A+2 partial sit>stand Toilet Transfer: +2 for physical  assistance, Maximal assistance, Stand-pivot Toilet Transfer Details (indicate cue type and reason): simulated to chair Toileting- Clothing Manipulation and Hygiene: Total assistance Toileting - Clothing Manipulation Details (indicate cue type and reason): Mod A+2 partial sit>stand    Mobility  Overal bed mobility: Needs Assistance Bed Mobility: Supine to Sit Supine to sit: Mod assist Sit to supine: Mod assist, +2 for physical assistance General bed mobility comments: assist with legs over EOB, raise trunk from sitting. able to balance sitting EOB with stand by assist     Transfers  Overall transfer level: Needs assistance Equipment used: 2 person hand held assist Transfers: Sit to/from Stand Sit to Stand: Max assist, +2 physical assistance General transfer comment: max A to stand, able to side step and pivot into chair, max A of two for stability, poor sequencing     Ambulation / Gait / Stairs / Wheelchair Mobility  Ambulation/Gait General Gait Details: not able yet    Posture / Balance Dynamic Sitting Balance Sitting balance - Comments: able to sit with supervision Balance Overall balance assessment: Needs assistance Sitting-balance support: Single extremity supported, Feet supported Sitting balance-Leahy Scale: Fair Sitting balance - Comments: able to sit with supervision Standing balance support: Bilateral upper extremity supported, During functional activity Standing balance-Leahy Scale: Zero Standing balance comment: external support to maintain balance. Not able to stand fully upright,.     Special needs/care consideration BiPAP/CPAP: CPAP at night at home PTA CPM: no Continuous Drip IV: 0.9% sodium chloride infusion, Ceftriaxone, feeding supplement. Dialysis: no       Days: no Life Vest: no Oxygen: 6L nasal cannula Special Bed: no Trach Size: no Wound Vac (area): no      Location: no Skin: abrasion to left anterior leg, blister to left posterior leg,  ecchymosis to bilateral arms, moisture associated skin damage to right groin, rash to right thigh. Pressure injury (stage I) to right side of face; skin tear to scrotum.         Bowel mgmt: incontinent, last BM: 05/02/18 Bladder mgmt: incontinence Diabetic mgmt: no     Previous Home Environment  Discharge Living Setting Plans for Discharge Living Setting: Patient's home Type of Home at Discharge: House Discharge Home Layout: One level Discharge Home Access: Stairs to enter Entrance Stairs-Rails: None Entrance Stairs-Number of Steps: 3-4 Discharge Bathroom Shower/Tub: Tub/shower unit Discharge Bathroom Toilet: Standard Discharge Bathroom Accessibility: Yes How Accessible: Accessible via walker Does the patient have any problems obtaining your medications?: Yes (Describe)(may have difficulty if he cannot drive)  Social/Family/Support Systems Patient Roles: Other (Comment)(has grown children; retired) Contact Information:  Holt (daughter): [phone]; Jay Riggle (Brother): [phone] Anticipated Caregiver: daughter, brother, extended family and friends Anticipated Caregiver's Contact Information: see above Ability/Limitations of Caregiver: Min A Caregiver Availability: Other (Comment)(24/7) Discharge Plan Discussed with Primary Caregiver: Yes Is Caregiver In Agreement with Plan?: Yes Does Caregiver/Family have Issues with Lodging/Transportation while Pt is in Rehab?: No   Goals/Additional Needs Patient/Family Goal for Rehab: PT/OT/SLP: Supervision/Min A Expected length of stay: 14-18 days Cultural Considerations: NA Dietary Needs: Dys1, Honey thick liquids Equipment Needs: TBD Pt/Family Agrees to Admission and willing to participate: Yes Program Orientation Provided & Reviewed with Pt/Caregiver Including Roles  & Responsibilities: Yes(pt and daughter)    Barriers to Discharge: Home environment access/layout, Lack of/limited family support, Nutrition means  Barriers  to Discharge Comments: stars to enter; tub shower, caregiver support   Decrease burden of Care through IP rehab admission: NA   Possible need for SNF placement upon discharge: not anticipated as pt has good social support and pt has good potential for improvement through CIR. If pt does not progress as expected, pt may need SNF placement.    Patient Condition: This patient's medical and functional status has changed since the consult dated 04/30/18 in which the Rehabilitation Physician determined and documented that the patient was potentially appropriate for intensive rehabilitative care in an inpatient rehabilitation facility. Issues have been addressed and update has been discussed with Dr. Swartz and patient now appropriate for inpatient rehabilitation. Social support has been addressed with pt's daugther and his brother willing to assist at DC. Will admit to inpatient rehab today.   Preadmission Screen Completed By:   , 05/03/2018 2:40 PM ______________________________________________________________________   Discussed status with Dr. Swartz on 05/03/18 at 2:39PM and received telephone approval for admission today.  Admission Coordinator:   , time 2:40PM/Date 05/03/18           Cosigned by: Swartz, Zachary T, MD at 05/03/2018 3:54 PM  Revision History                    

## 2018-05-03 NOTE — Discharge Summary (Signed)
Physician Discharge Summary  Paul [name] DJM:[id number] DOB: Nov 14, 1950 DOA: 04/21/2018  PCP: Leanna Battles, MD  Admit date: 04/21/2018 Discharge date: 05/03/2018   Admitted From: Home Disposition: CIR  Recommendations for Outpatient Follow-up:  1. Follow up with PCP in 1-2 weeks 2. Further Care per CIR 3. Please obtain CMP/CBC, Mag, Phos in one week 4. Please follow up on the following pending results: [place]: No Equipment/Devices: None Recommeded by PT    Discharge Condition: Stable  CODE STATUS: FULL CODE Diet recommendation: NPO until MBS is done   Brief/Interim Summary: The patient is an 68 yo male with COPD on supplemental O2, CAD s/p stents 2013 and other comorbidts presenting after being found confused and dyspneic by family. Profoundly hypoxic in ED. Intubated and found to have RUL pneumonia, strep pneumo bacteremia, and DKA as well as other conditions. He was extubated 04/27/2018 and is improving slowly. Continuedto have delirium and agitation after extubation and was placed on a Clonidine Taper which has been now completed. Still has Dysphagia and awaiting further speech recommendations based on MBS done today. Currently on Day 13/14 of Abx for his bacteremia. He has improved daily. WBC is slightly elevated and has been elevating the last few days but patient is afebrile. LFTs are slightly abnormal as well but not signficantly. He was deemed medically stable to D/C to CIR and will need to complete Abx course and follow up on MBS as he is still NPO and receiving Tube Feedings.   Discharge Diagnoses:  Active Problems:   CAP (community acquired pneumonia)   Respiratory failure ([place])   Pressure injury of skin   Coronary artery disease involving native coronary artery of native heart without angina pectoris   Tobacco abuse   Hypertension   Acute blood loss anemia   Bacteremia  Acute on Chronic Hypoxic Respiratory Failure, improving  -Improving. Extubated 1/11 stable  on [place]. Secondary to RUL PNA. Blood cultures with Strep Pneumonia,  -RVP +RSV, and Sputum cx with MSSA and Rare Candida Albicans - Continue Ceftriaxone for total of 2 weeks, Currently is 13/14 days,tentativestop dateJanuary 19. -Wean oxygen as able. -Repeat CXR today showed Persistent Stable Right Upper Lobe Infiltrate  -PT is recommending CIR  Encephalopathy, improving -Still confused some but improving daily  -Had [place] today  -Completed Clonidine Taper -C/w IV Haldol 2-4 mg q6h prnAgitation  -Patient is off the Precedex infusion. -Continue to monitor and place on Delirium Precautions   Dysphagia -Failed swallow evaluation, most likely due to altered mental status.  -Cortrak was placed and he was started on tube feed. -Continue tube feeds for now and C/w Osmolite 1.5 CAL per Tube at 55 mL/hr and Pro-Stat Sugar Free 30 mL BID  -Follow with speech recommendations and have re-consulted -SLP recommended NPO except Ice Chips on 04/30/2018 and recommending MBS today -Follow up on SLP recommendations   Strep Pneumo Bacteremia -Remainsafebrile but has a slight Leukocytosis now  -Continue Ceftriaxone (Day13/14) -Continue to Trend fever curve; Temperature was 97.4 toady  -Repeat blood cultures & resp cultures NGTD at 5 Days and Respiratory Cx showed Abundant WBC present, Predominantly PMN and Rare Gram + Cocci with Cx showing Rare Candida Albicans -Continue PT and OT. -WBC slightly worsened and went from 6.0 -> 8.4 -> 10.7 -> 11.4 and ? In the setting of [place] -Continue to Monitor for S/Sx of Infection but he is Afebrile and has been afebrile and CXR remains stable -If significantly worsening recommending Pan-Culturing again  -Repeat CBC in  AM   Mild Hypernatremia/Hyperchloremia -Improving -Na+ is now 146 and Chloride is now 111 -Continue to Monitor and Trend and repeat CMP in AM   Hyperglycemia in the setting of Uncontrolled Diabetes Mellitus Type 2 -Blood sugar  elevated with the start of tube feeds. -Levemir 10 unitsBIDstarted by the ICU team,we will titrate accordingly and continue for now -HbA1c was 11.8 -C/w Moderate SSI q4h and Added 3 units Novolog sq q4h -Continue to monitor CBG's carefully -CBG's ranging from 114-290  Constipation -Large BM 1/11 after Lactulose  -C/w BID Senokot 1 tab po BID per Tube  COPD -C/w O2 -C/w Arfomoterol 15 mcg Neb BID and Budesonide 0.5 mg Neb BID -C/w DuoNeb 3 mL TID and Guaifenesin 1200 mg po BID -Repeat CXR this AM showed   Essential HTN -At Home takes Toprol 50 mg daily but is now on Metoprolol Tartrate 25 mg po BID as he is receiving TF -Patient currently n.p.o. on tube feeds.  -Unable to crush the long-acting therefore changed to short-acting beta-blocker. -BP was now 150/82 -Home Amlodipine can be restarted if needed but had been held -Continue to Monitor blood pressure closely and adjust medications as needed. -C/w IV prn Hydralazine 5 mg q4hprn High Blood Pressure  CAD s/p Stent Placement 2013 -C/w Clopidogrel 75 mg po daily   Normocytic Anemia  -Patient's Hb/Hct went from 11.2/36.0 -> 12.6/40.5 -> 12.3/40.1 -Check Anemia Panel in the AM  -Continue to Monitor for S/Sx of Bleeding -Repeat CBC in AM   Abnormal LFT's -Mildly elevated as AST is 63 and ALT is 47 -Likely Reactive -Recommending to Trend and repeat CMP in AM  -If worsening or not improving consider obtaining a RUQ U/S and an Acute Hepatitis Panel -Repeat CMP in AM   Discharge Instructions  Discharge Instructions    Call MD for:  difficulty breathing, headache or visual disturbances   Complete by:  As directed    Call MD for:  extreme fatigue   Complete by:  As directed    Call MD for:  hives   Complete by:  As directed    Call MD for:  persistant dizziness or light-headedness   Complete by:  As directed    Call MD for:  persistant nausea and vomiting   Complete by:  As directed    Call MD for:  redness,  tenderness, or signs of infection (pain, swelling, redness, odor or green/yellow discharge around incision site)   Complete by:  As directed    Call MD for:  severe uncontrolled pain   Complete by:  As directed    Call MD for:  temperature >100.4   Complete by:  As directed    Discharge instructions   Complete by:  As directed    You were cared for by a hospitalist during your hospital stay. If you have any questions about your discharge medications or the care you received while you were in the hospital after you are discharged, you can call the unit and ask to speak with the hospitalist on call if the hospitalist that took care of you is not available. Once you are discharged, your primary care physician will handle any further medical issues. Please note that NO REFILLS for any discharge medications will be authorized once you are discharged, as it is imperative that you return to your primary care physician (or establish a relationship with a primary care physician if you do not have one) for your aftercare needs so that they can reassess your  need for medications and monitor your lab values.  Follow up with PCP and Pulmonary as an outpatient. Take all medications as prescribed. If symptoms change or worsen please return to the ED for evaluation. Further Care per CIR Physician   Increase activity slowly   Complete by:  As directed      Allergies as of 05/03/2018      Reactions   Codeine Nausea And Vomiting   Duloxetine Hcl Other (See Comments)   Took at night and still felt very lethargic the following day.  (malaise)      Medication List    STOP taking these medications   ADVAIR DISKUS 250-50 MCG/DOSE Aepb Generic drug:  Fluticasone-Salmeterol   DULoxetine 30 MG capsule Commonly known as:  CYMBALTA   ibuprofen 200 MG tablet Commonly known as:  ADVIL,MOTRIN   metFORMIN 500 MG 24 hr tablet Commonly known as:  GLUCOPHAGE-XR   metoprolol succinate 25 MG 24 hr tablet Commonly  known as:  TOPROL-XL   SPIRIVA HANDIHALER 18 MCG inhalation capsule Generic drug:  tiotropium   zolpidem 10 MG tablet Commonly known as:  AMBIEN     TAKE these medications   acetaminophen 160 MG/5ML solution Commonly known as:  TYLENOL Take 20.3 mLs (650 mg total) by mouth every 4 (four) hours as needed for mild pain or fever.   albuterol (2.5 MG/3ML) 0.083% nebulizer solution Commonly known as:  PROVENTIL Take 3 mLs (2.5 mg total) by nebulization every 4 (four) hours as needed for wheezing or shortness of breath.   amLODipine 5 MG tablet Commonly known as:  NORVASC Take 5 mg by mouth daily.   arformoterol 15 MCG/2ML Nebu Commonly known as:  BROVANA Take 2 mLs (15 mcg total) by nebulization 2 (two) times daily.   budesonide 0.5 MG/2ML nebulizer solution Commonly known as:  PULMICORT Take 2 mLs (0.5 mg total) by nebulization 2 (two) times daily.   cefTRIAXone 2 g in sodium chloride 0.9 % 100 mL Inject 2 g into the vein daily for 1 day. Start taking on:  May 04, 2018   clopidogrel 75 MG tablet Commonly known as:  PLAVIX Take 1 tablet (75 mg total) by mouth daily. Start taking on:  May 04, 2018   colchicine 0.6 MG tablet Commonly known as:  COLCRYS Take two tabs by mouth at once then a third tablet one hour later. What changed:    how much to take  how to take this  when to take this   esomeprazole 20 MG capsule Commonly known as:  NEXIUM Take 20 mg by mouth daily at 12 noon.   feeding supplement (OSMOLITE 1.5 CAL) Liqd Place 1,000 mLs into feeding tube continuous.   feeding supplement (PRO-STAT SUGAR FREE 64) Liqd Take 30 mLs by mouth 2 (two) times daily.   gabapentin 300 MG capsule Commonly known as:  NEURONTIN Take 300 mg by mouth at bedtime.   guaiFENesin 600 MG 12 hr tablet Commonly known as:  MUCINEX Take 2 tablets (1,200 mg total) by mouth 2 (two) times daily.   insulin aspart 100 UNIT/ML injection Commonly known as:  novoLOG Inject  0-15 Units into the skin every 4 (four) hours.   insulin detemir 100 UNIT/ML injection Commonly known as:  LEVEMIR Inject 0.1 mLs (10 Units total) into the skin 2 (two) times daily.   ipratropium-albuterol 0.5-2.5 (3) MG/3ML Soln Commonly known as:  DUONEB Take 3 mLs by nebulization 3 (three) times daily.   metoprolol tartrate 25 MG tablet Commonly  known as:  LOPRESSOR Take 1 tablet (25 mg total) by mouth 2 (two) times daily.   mouth rinse Liqd solution 15 mLs by Mouth Rinse route 2 times daily at 12 noon and 4 pm.   nitroGLYCERIN 0.4 MG SL tablet Commonly known as:  NITROSTAT Place 1 tablet (0.4 mg total) under the tongue every 5 (five) minutes as needed for chest pain.   polyvinyl alcohol 1.4 % ophthalmic solution Commonly known as:  LIQUIFILM TEARS Place 1 drop into both eyes 3 (three) times daily as needed for dry eyes.   senna-docusate 8.6-50 MG tablet Commonly known as:  Senokot-S Place 1 tablet into feeding tube 2 (two) times daily.       Allergies  Allergen Reactions  . Codeine Nausea And Vomiting  . Duloxetine Hcl Other (See Comments)    Took at night and still felt very lethargic the following day.  (malaise)   Consultations:  PCCM Transfer  Procedures/Studies: Ct Chest Wo Contrast  Result Date: 04/26/2018 CLINICAL DATA:  68 year old male with RIGHT lung consolidation/possible pneumonia and fever. EXAM: CT CHEST WITHOUT CONTRAST TECHNIQUE: Multidetector CT imaging of the chest was performed following the standard protocol without IV contrast. COMPARISON:  04/26/2018 and prior chest radiographs FINDINGS: Cardiovascular: UPPER limits normal heart size noted. Heavy coronary artery atherosclerotic calcifications identified with LAD stent. Aortic atherosclerotic calcifications noted without aneurysm. No pericardial effusion. Mediastinum/Nodes: No enlarged mediastinal or axillary lymph nodes. Thyroid gland, trachea, and esophagus demonstrate no significant findings.  Lungs/Pleura: Consolidation of the RIGHT UPPER lobe identified, primarily involving the posterior and INFERIOR aspects of the RIGHT UPPER lobe. Some degree of RIGHT UPPER lobe atelectasis and RIGHT middle lobe atelectasis also identified. Mild bibasilar atelectasis noted. Mild scattered tree-in-bud opacities within both LOWER lobes, RIGHT greater than LEFT, may represent infection. No pleural effusion or pneumothorax. Upper Abdomen: No acute abnormality. Enlargement of the visualized spleen is noted. An NG tube is noted entering the stomach. Musculoskeletal: No acute or suspicious bony abnormalities. IMPRESSION: 1. RIGHT UPPER lobe consolidation with some degree of RIGHT UPPER and RIGHT middle lobe atelectasis. This is nonspecific but most likely represents infection/pneumonia. Radiographic follow-up to resolution recommended. 2. Mild tree-in-bud opacities within bilateral LOWER lobes, suspicious for infection. 3. Splenomegaly 4. Coronary artery and Aortic Atherosclerosis (ICD10-I70.0). Electronically Signed   By: Margarette Canada M.D.   On: 04/26/2018 13:08   Dg Chest Port 1 View  Result Date: 05/03/2018 CLINICAL DATA:  Shortness of breath EXAM: PORTABLE CHEST 1 VIEW COMPARISON:  04/27/2018 FINDINGS: Endotracheal tube has been removed. Feeding catheter remains extending into the stomach. Postsurgical changes in the cervical spine are noted. Cardiac shadow is stable. Left lung remains clear. Persistent right upper lobe infiltrate is noted IMPRESSION: Stable right upper lobe infiltrate. Electronically Signed   By: Inez Catalina M.D.   On: 05/03/2018 08:18   Dg Chest Port 1 View  Result Date: 04/27/2018 CLINICAL DATA:  Encounter for intubation EXAM: PORTABLE CHEST - 1 VIEW COMPARISON:  the previous day's study FINDINGS: Endotracheal tube tip approximately 4.3 cm above carina. Nasogastric tube extends at least as far as the stomach, tip not seen. Partial improvement in the right lung airspace opacities, primarily  perihilar and right upper lobe. Left lung remains clear. Heart size and mediastinal contours are within normal limits. No effusion. Visualized bones unremarkable. IMPRESSION: 1. Endotracheal tube and nasogastric tube placement as above. 2. Improving right lung airspace disease. Electronically Signed   By: Lucrezia Europe M.D.   On: 04/27/2018 09:11  Dg Chest Port 1 View  Result Date: 04/26/2018 CLINICAL DATA:  Encounter for intubation. EXAM: PORTABLE CHEST 1 VIEW COMPARISON:  Radiograph of April 25, 2018. FINDINGS: The heart size and mediastinal contours are within normal limits. Endotracheal and nasogastric tubes are unchanged in position. No pneumothorax is noted. Left lung is clear. Hypoinflation of the lungs is noted. Right upper lobe airspace opacity is noted concerning for pneumonia or atelectasis. The visualized skeletal structures are unremarkable. IMPRESSION: Stable support apparatus. Hypoinflation of the lungs. Stable right upper lobe opacity as described above. Electronically Signed   By: Marijo Conception, M.D.   On: 04/26/2018 07:48   Dg Chest Port 1 View  Result Date: 04/25/2018 CLINICAL DATA:  Intubation.  Respiratory failure. EXAM: PORTABLE CHEST 1 VIEW COMPARISON:  04/24/2018. FINDINGS: Endotracheal tube and NG tube in stable position. Heart size stable. Persistent atelectasis/infiltrate right upper lobe and right lower lobe. No significant interim change. No pleural effusion or pneumothorax. Prior cervical fusion. IMPRESSION: 1.  Lines and tubes stable position. 2. Persistent right upper lobe and right lower lobe atelectasis/infiltrates. No significant interim change. Electronically Signed   By: Marcello Moores  Register   On: 04/25/2018 06:04   Dg Chest Port 1 View  Result Date: 04/24/2018 CLINICAL DATA:  Respiratory failure EXAM: PORTABLE CHEST 1 VIEW COMPARISON:  Yesterday FINDINGS: Endotracheal tube tip just below the clavicular heads. An orogastric tube at least reaches the stomach. Borderline  heart size distorted by rotation. Asymmetric right upper lobe airspace disease. No evident effusion or air leak. IMPRESSION: Stable hardware positioning and right upper lobe consolidation. Electronically Signed   By: Monte Fantasia M.D.   On: 04/24/2018 05:39   Dg Chest Port 1 View  Result Date: 04/23/2018 CLINICAL DATA:  Evaluate position of endotracheal tube, COPD EXAM: PORTABLE CHEST 1 VIEW COMPARISON:  Portable chest x-ray of 04/22/2018 FINDINGS: The tip of the endotracheal tube is approximately 3.1 cm above the carina. There is little change in opacity within the right upper lobe most consistent with pneumonia. Otherwise the lungs are clear. The heart is within upper limits of normal. No acute bony abnormality is seen. NG tube extends below the hemidiaphragm. IMPRESSION: 1. Tip of endotracheal tube 3.1 cm above the carina. 2. Little change in right upper lobe opacity most consistent with pneumonia. Electronically Signed   By: Ivar Drape M.D.   On: 04/23/2018 11:08   Dg Chest Port 1 View  Result Date: 04/22/2018 CLINICAL DATA:  Endotracheal intubation EXAM: PORTABLE CHEST 1 VIEW COMPARISON:  Yesterday FINDINGS: Endotracheal tube tip just below the clavicular heads. An orogastric tube at least reaches the stomach. Extensive right upper lobe consolidation. The left lung remains clear. Normal heart size and stable mediastinal contours accounting for rightward rotation. IMPRESSION: 1. Improved endotracheal tube positioning. 2. Extensive right upper lobe pneumonia without interval change. Electronically Signed   By: Monte Fantasia M.D.   On: 04/22/2018 06:51   Dg Chest Portable 1 View  Result Date: 04/21/2018 CLINICAL DATA:  Intubation EXAM: PORTABLE CHEST 1 VIEW COMPARISON:  Portable exam 1507 hours compared to 12/13 hours FINDINGS: New endotracheal tube with tip below carina in RIGHT mainstem bronchus; recommend withdrawal 4 cm. Nasogastric tube extends into abdomen. Stable heart size and mediastinal  contours. Persistent RIGHT upper lobe consolidation consistent with pneumonia. Remaining lungs clear. No pneumothorax. IMPRESSION: RIGHT mainstem bronchus intubation; recommend withdrawal of endotracheal tube 4 cm for positioning above carina (already repositioned at time of interpretation of this exam). RIGHT upper lobe consolidation  consistent with pneumonia. Electronically Signed   By: Lavonia Dana M.D.   On: 04/21/2018 16:02   Dg Chest Portable 1 View  Result Date: 04/21/2018 CLINICAL DATA:  Intubation EXAM: PORTABLE CHEST 1 VIEW COMPARISON:  Portable exam 1507 hours compared to immediately preceding studies. FINDINGS: Rotated to the RIGHT. Tip of endotracheal tube projects 9 mm above carina. Nasogastric tube extends into abdomen. Normal heart size mediastinal contours. Dense RIGHT upper lobe consolidation persists. Mild infiltrate versus atelectasis at RIGHT base. LEFT lung clear. No pneumothorax Prior cervical spine fusion. LEFT glenohumeral degenerative changes. IMPRESSION: Tip of endotracheal tube 9 mm from carina. RIGHT upper lobe pneumonia with additional atelectasis versus consolidation at RIGHT base. Electronically Signed   By: Lavonia Dana M.D.   On: 04/21/2018 16:00   Dg Chest Port 1 View  Result Date: 04/21/2018 CLINICAL DATA:  Respiratory distress, onset today. Hypoxia. EXAM: PORTABLE CHEST 1 VIEW COMPARISON:  02/05/2014 FINDINGS: There is consolidation of the right upper lobe more dense peripherally. Fullness of the right hilum. Atherosclerotic calcification of the aortic arch. The left lung appears clear. Heart size within normal limits. IMPRESSION: 1. Dense consolidation in the right upper lobe potentially from pneumonia, pulmonary hemorrhage, or atelectasis. Fullness of the right hilum could be from mass or adenopathy. Correlate with patient's symptoms. If treatment for pneumonia is indicated, I would recommend followup PA and lateral chest X-ray in 3-4 weeks following trial of antibiotic  therapy to ensure resolution and exclude underlying malignancy. Of further imaging workup is indicated at this time, CT or CT angiography of the chest could be utilized. Electronically Signed   By: Van Clines M.D.   On: 04/21/2018 12:27   Dg Abd Portable 1v  Result Date: 04/21/2018 CLINICAL DATA:  OG tube placement. EXAM: PORTABLE ABDOMEN - 1 VIEW COMPARISON:  Chest radiographs earlier this day. FINDINGS: Tip and side port of the enteric tube below the diaphragm in the stomach. No dilated bowel in the upper abdomen. Opacification in the right upper lobe is partially included. IMPRESSION: Tip and side port of the enteric tube below the diaphragm in the stomach. Electronically Signed   By: Keith Rake M.D.   On: 04/21/2018 22:03   Subjective: And examined at bedside and is doing better today.  Not as agitated and was wanting to talk and actually was told me about his cars.  No chest pain, lightheadedness or dizziness but was wanting to swallow.  No other concerns or complaints at this time and is been deemed medically stable to be discharged to [facility] with further care per [facility] Physician.  Discharge Exam: Vitals:   05/03/18 0814 05/03/18 0951  BP:  (!) 150/82  Pulse:  96  Resp:    Temp:    SpO2: 94%    Vitals:   05/03/18 0002 05/03/18 0756 05/03/18 0814 05/03/18 0951  BP: (!) 182/104 (!) 155/82  (!) 150/82  Pulse: (!) 123 95  96  Resp: (!) 24 13    Temp: 97.9 F (36.6 C) 98 F (36.7 C)    TempSrc: Oral Oral    SpO2: 93% 98% 94%   Weight:      Height:       General: Pt is alert, awake, and slightly anxious but not in any acute Distress Cardiovascular: Slightly tachycardic, S1/S2 +, no rubs, no gallops Respiratory: Diminished bilaterally, no wheezing, no rhonchi but has coarse breath sounds Abdominal: Soft, NT, Distended, bowel sounds + Extremities: Trace edema, no cyanosis  The results of  significant diagnostics from this hospitalization (including imaging, microbiology,  ancillary and laboratory) are listed below for reference.    Microbiology: Recent Results (from the past 240 hour(s))  Culture, respiratory (non-expectorated)     Status: None   Collection Time: 04/25/18  8:20 AM  Result Value Ref Range Status   Specimen Description TRACHEAL ASPIRATE  Final   Special Requests NONE  Final   Gram Stain   Final    ABUNDANT WBC PRESENT, PREDOMINANTLY PMN RARE GRAM POSITIVE COCCI Performed at [facility], [address] [place], [place] [address]    Culture RARE CANDIDA ALBICANS  Final   Report Status 04/28/2018 FINAL  Final  Culture, blood (routine x 2)     Status: None   Collection Time: 04/25/18  3:38 PM  Result Value Ref Range Status   Specimen Description BLOOD RIGHT HAND  Final   Special Requests   Final    AEROBIC BOTTLE ONLY Blood Culture results may not be optimal due to an inadequate volume of blood received in culture bottles   Culture   Final    NO GROWTH 5 DAYS Performed at [facility], [place] [address] [place], [place] [address]    Report Status 04/30/2018 FINAL  Final  Culture, blood (routine x 2)     Status: None   Collection Time: 04/25/18  3:38 PM  Result Value Ref Range Status   Specimen Description BLOOD RIGHT HAND  Final   Special Requests   Final    AEROBIC BOTTLE ONLY Blood Culture results may not be optimal due to an inadequate volume of blood received in culture bottles   Culture   Final    NO GROWTH 5 DAYS Performed at [facility], [place] [address] [place], [place] [address]    Report Status 04/30/2018 FINAL  Final    Labs: BNP (last 3 results) Recent Labs    04/21/18 1202  BNP 505.6*   Basic Metabolic Panel: Recent Labs  Lab 04/27/18 0655 04/28/18 0920 04/30/18 0452 05/01/18 0343 05/02/18 0545 05/02/18 0906 05/03/18 0556  NA 144 146* 144 148* 147*  --  146*  K 4.3 3.9 3.5 3.9 3.6  --  4.4  CL 115* 117* 114* 115* 112*  --  111  CO2 22 23 24 26 26   --  26  GLUCOSE 214* 156* 313* 198*  235*  --  224*  BUN 35* 27* 25* 25* 21  --  20  CREATININE 0.96 0.86 0.87 0.83 0.75  --  0.83  CALCIUM 7.8* 8.0* 7.8* 8.3* 8.7*  --  8.5*  MG 2.0  --  1.9  --   --  1.9 1.8  PHOS 2.6  --  3.3  --   --  3.4 4.0   Liver Function Tests: Recent Labs  Lab 05/03/18 0556  AST 63*  ALT 47*  ALKPHOS 99  BILITOT 0.8  PROT 6.4*  ALBUMIN 2.2*   No results for input(s): LIPASE, AMYLASE in the last 168 hours. No results for input(s): AMMONIA in the last 168 hours. CBC: Recent Labs  Lab 04/27/18 0655 04/30/18 0452 05/01/18 0343 05/02/18 0545 05/03/18 0556  WBC 11.0* 6.0 8.4 10.7* 11.4*  NEUTROABS  --   --   --   --  8.7*  HGB 11.1* 10.6* 11.2* 12.6* 12.3*  HCT 36.5* 34.0* 36.0* 40.5 40.1  MCV 98.4 96.3 98.9 98.8 98.8  PLT 182 249 283 309 272   Cardiac Enzymes: No  results for input(s): CKTOTAL, CKMB, CKMBINDEX, TROPONINI in the last 168 hours. BNP: Invalid input(s): POCBNP CBG: Recent Labs  Lab 05/02/18 2012 05/03/18 0016 05/03/18 0403 05/03/18 0754 05/03/18 1148  GLUCAP 249* 290* 203* 114* 164*   D-Dimer No results for input(s): DDIMER in the last 72 hours. Hgb A1c Recent Labs    05/02/18 0737  HGBA1C 11.8*   Lipid Profile No results for input(s): CHOL, HDL, LDLCALC, TRIG, CHOLHDL, LDLDIRECT in the last 72 hours. Thyroid function studies No results for input(s): TSH, T4TOTAL, T3FREE, THYROIDAB in the last 72 hours.  Invalid input(s): FREET3 Anemia work up No results for input(s): VITAMINB12, FOLATE, FERRITIN, TIBC, IRON, RETICCTPCT in the last 72 hours. Urinalysis    Component Value Date/Time   COLORURINE YELLOW 04/21/2018 1350   APPEARANCEUR HAZY (A) 04/21/2018 1350   LABSPEC 1.026 04/21/2018 1350   PHURINE 5.0 04/21/2018 1350   GLUCOSEU >=500 (A) 04/21/2018 1350   HGBUR MODERATE (A) 04/21/2018 1350   BILIRUBINUR NEGATIVE 04/21/2018 1350   KETONESUR 20 (A) 04/21/2018 1350   PROTEINUR NEGATIVE 04/21/2018 1350   UROBILINOGEN 1.0 02/05/2014 1253   NITRITE  NEGATIVE 04/21/2018 1350   LEUKOCYTESUR NEGATIVE 04/21/2018 1350   Sepsis Labs Invalid input(s): PROCALCITONIN,  WBC,  LACTICIDVEN Microbiology Recent Results (from the past 240 hour(s))  Culture, respiratory (non-expectorated)     Status: None   Collection Time: 04/25/18  8:20 AM  Result Value Ref Range Status   Specimen Description TRACHEAL ASPIRATE  Final   Special Requests NONE  Final   Gram Stain   Final    ABUNDANT WBC PRESENT, PREDOMINANTLY PMN RARE GRAM POSITIVE COCCI Performed at [facility], [place] [address] [place], [name] [address]    Culture RARE CANDIDA ALBICANS  Final   Report Status 04/28/2018 FINAL  Final  Culture, blood (routine x 2)     Status: None   Collection Time: 04/25/18  3:38 PM  Result Value Ref Range Status   Specimen Description BLOOD RIGHT HAND  Final   Special Requests   Final    AEROBIC BOTTLE ONLY Blood Culture results may not be optimal due to an inadequate volume of blood received in culture bottles   Culture   Final    NO GROWTH 5 DAYS Performed at [facility], [place] [address] [place], [place] [address]    Report Status 04/30/2018 FINAL  Final  Culture, blood (routine x 2)     Status: None   Collection Time: 04/25/18  3:38 PM  Result Value Ref Range Status   Specimen Description BLOOD RIGHT HAND  Final   Special Requests   Final    AEROBIC BOTTLE ONLY Blood Culture results may not be optimal due to an inadequate volume of blood received in culture bottles   Culture   Final    NO GROWTH 5 DAYS Performed at [facility], [place] [address], [place], [place] [address]    Report Status 04/30/2018 FINAL  Final   Time coordinating discharge: 35 minutes  SIGNED:  Kerney Elbe, DO [facility] 05/03/2018, 2:20 PM Pager is on [place]  If 7PM-7AM, please contact night-coverage [email or web address] Password [email or web address]

## 2018-05-03 NOTE — Progress Notes (Signed)
  Speech Language Pathology Treatment: Dysphagia  Patient Details Name: Osamu Olguin MRN: [id number] DOB: 1950-08-28 Today's Date: 05/03/2018 Time: 3300-7622 SLP Time Calculation (min) (ACUTE ONLY): 12 min  Assessment / Plan / Recommendation Clinical Impression  Pt demonstrates subjective improvement in swallow function, vocal phonation clearer, cough stronger. Mentation also now appropriate for participation in [place]. There are still persistent signs of aspiration with thin liquids. Will plan for MBS today to address diet. Pt aware.   HPI HPI: 68 year old man with COPD on home oxygen admitted 1/5 with right upper lobe community-acquired pneumonia requiring mechanical ventilation. Intubated until 1/11.       SLP Plan  MBS       Recommendations  Diet recommendations: NPO                SLP Visit Diagnosis: Dysphagia, unspecified (R13.10) Plan: MBS       GO               Herbie [place], MA CCC-SLP  Acute Rehabilitation Services Pager [phone] Office [phone]  Lynann Beaver 05/03/2018, 9:02 AM

## 2018-05-03 NOTE — Care Management Note (Signed)
Case Management Note  Patient Details  Name: Paul [name] MRN: [id number] Date of Birth: [birth year]  Subjective/Objective: DC to CIR today per Claiborne Billings with CIR.                   Action/Plan: DC to CIR today.  Expected Discharge Date:  05/03/18               Expected Discharge Plan:  IP Rehab Facility  In-House Referral:  Clinical Social Work  Discharge planning Services  CM Consult  Post Acute Care Choice:    Choice offered to:     DME Arranged:    DME Agency:     HH Arranged:    [place] Agency:     Status of Service:  Completed, signed off  If discussed at [employer], dates discussed:    Additional Comments:  Zenon Mayo, RN 05/03/2018, 2:24 PM

## 2018-05-04 ENCOUNTER — Inpatient Hospital Stay (HOSPITAL_COMMUNITY): Payer: Medicare Other | Admitting: Occupational Therapy

## 2018-05-04 ENCOUNTER — Inpatient Hospital Stay (HOSPITAL_COMMUNITY): Payer: Medicare Other | Admitting: Physical Therapy

## 2018-05-04 ENCOUNTER — Inpatient Hospital Stay (HOSPITAL_COMMUNITY): Payer: Medicare Other | Admitting: Speech Pathology

## 2018-05-04 DIAGNOSIS — E1169 Type 2 diabetes mellitus with other specified complication: Secondary | ICD-10-CM

## 2018-05-04 DIAGNOSIS — E669 Obesity, unspecified: Secondary | ICD-10-CM

## 2018-05-04 DIAGNOSIS — J154 Pneumonia due to other streptococci: Secondary | ICD-10-CM

## 2018-05-04 LAB — GLUCOSE, CAPILLARY
Glucose-Capillary: 180 mg/dL — ABNORMAL HIGH (ref 70–99)
Glucose-Capillary: 161 mg/dL — ABNORMAL HIGH (ref 70–99)
Glucose-Capillary: 184 mg/dL — ABNORMAL HIGH (ref 70–99)
Glucose-Capillary: 158 mg/dL — ABNORMAL HIGH (ref 70–99)
Glucose-Capillary: 263 mg/dL — ABNORMAL HIGH (ref 70–99)

## 2018-05-04 MED ORDER — INSULIN ASPART 100 UNIT/ML ~~LOC~~ SOLN
0.0000 [IU] | Freq: Every day | SUBCUTANEOUS | Status: DC
  Administered 2018-05-04: 3 [IU] via SUBCUTANEOUS

## 2018-05-04 MED ORDER — FREE WATER
100.0000 mL | Status: DC
  Administered 2018-05-04 – 2018-05-05 (×5): 100 mL

## 2018-05-04 MED ORDER — PANTOPRAZOLE SODIUM 40 MG PO PACK
40.0000 mg | PACK | Freq: Every day | ORAL | Status: DC
  Administered 2018-05-04 – 2018-05-17 (×14): 40 mg
  Filled 2018-05-04 (×13): qty 20

## 2018-05-04 MED ORDER — PRO-STAT SUGAR FREE PO LIQD
30.0000 mL | Freq: Two times a day (BID) | ORAL | Status: DC
  Administered 2018-05-04 – 2018-05-07 (×7): 30 mL
  Filled 2018-05-04 (×6): qty 30

## 2018-05-04 MED ORDER — ENOXAPARIN SODIUM 40 MG/0.4ML ~~LOC~~ SOLN
40.0000 mg | SUBCUTANEOUS | Status: DC
  Administered 2018-05-04 – 2018-05-21 (×18): 40 mg via SUBCUTANEOUS
  Filled 2018-05-04 (×18): qty 0.4

## 2018-05-04 MED ORDER — FREE WATER
100.0000 mL | Freq: Three times a day (TID) | Status: DC
  Administered 2018-05-04: 100 mL

## 2018-05-04 MED ORDER — ALUM & MAG HYDROXIDE-SIMETH 200-200-20 MG/5ML PO SUSP
10.0000 mL | Freq: Four times a day (QID) | ORAL | Status: DC | PRN
  Administered 2018-05-04: 10 mL via ORAL
  Filled 2018-05-04: qty 30

## 2018-05-04 MED ORDER — INSULIN ASPART 100 UNIT/ML ~~LOC~~ SOLN
0.0000 [IU] | Freq: Three times a day (TID) | SUBCUTANEOUS | Status: DC
  Administered 2018-05-04: 3 [IU] via SUBCUTANEOUS
  Administered 2018-05-05: 2 [IU] via SUBCUTANEOUS
  Administered 2018-05-05 – 2018-05-07 (×4): 3 [IU] via SUBCUTANEOUS
  Administered 2018-05-07: 2 [IU] via SUBCUTANEOUS
  Administered 2018-05-08 – 2018-05-09 (×3): 3 [IU] via SUBCUTANEOUS
  Administered 2018-05-09: 2 [IU] via SUBCUTANEOUS
  Administered 2018-05-09: 3 [IU] via SUBCUTANEOUS
  Administered 2018-05-10 (×3): 2 [IU] via SUBCUTANEOUS
  Administered 2018-05-11 (×2): 3 [IU] via SUBCUTANEOUS
  Administered 2018-05-11 – 2018-05-12 (×2): 2 [IU] via SUBCUTANEOUS
  Administered 2018-05-12: 3 [IU] via SUBCUTANEOUS
  Administered 2018-05-12: 2 [IU] via SUBCUTANEOUS
  Administered 2018-05-13: 3 [IU] via SUBCUTANEOUS
  Administered 2018-05-13: 2 [IU] via SUBCUTANEOUS
  Administered 2018-05-14 – 2018-05-16 (×4): 3 [IU] via SUBCUTANEOUS
  Administered 2018-05-16: 2 [IU] via SUBCUTANEOUS
  Administered 2018-05-17: 3 [IU] via SUBCUTANEOUS
  Administered 2018-05-18 – 2018-05-19 (×2): 2 [IU] via SUBCUTANEOUS
  Administered 2018-05-19: 3 [IU] via SUBCUTANEOUS
  Administered 2018-05-19: 2 [IU] via SUBCUTANEOUS
  Administered 2018-05-20: 5 [IU] via SUBCUTANEOUS
  Administered 2018-05-20 – 2018-05-21 (×3): 3 [IU] via SUBCUTANEOUS
  Administered 2018-05-21 – 2018-05-22 (×2): 2 [IU] via SUBCUTANEOUS

## 2018-05-04 MED ORDER — TRAZODONE HCL 50 MG PO TABS
50.0000 mg | ORAL_TABLET | Freq: Every day | ORAL | Status: DC
  Administered 2018-05-04: 50 mg via ORAL
  Filled 2018-05-04: qty 1

## 2018-05-04 MED ORDER — INSULIN DETEMIR 100 UNIT/ML ~~LOC~~ SOLN
14.0000 [IU] | Freq: Two times a day (BID) | SUBCUTANEOUS | Status: DC
  Administered 2018-05-04: 14 [IU] via SUBCUTANEOUS
  Filled 2018-05-04 (×2): qty 0.14

## 2018-05-04 MED ORDER — OSMOLITE 1.5 CAL PO LIQD
1000.0000 mL | ORAL | Status: DC
  Administered 2018-05-04: 1000 mL
  Filled 2018-05-04: qty 1000

## 2018-05-04 NOTE — Evaluation (Signed)
Speech Language Pathology Assessment and Plan  Patient Details  Name: Paul [name] MRN: [id number] Date of Birth: 1950-05-11  SLP Diagnosis: Dysphagia;Cognitive Impairments  Rehab Potential: Good ELOS: 14-21 days     Today's Date: 05/04/2018 SLP Individual Time: 4098-1191 SLP Individual Time Calculation (min): 60 min   Problem List:  Patient Active Problem List   Diagnosis Date Noted  . Acute encephalopathy 05/03/2018  . Pressure injury of skin 04/30/2018  . Coronary artery disease involving native coronary artery of native heart without angina pectoris   . Tobacco abuse   . Hypertension   . Acute blood loss anemia   . Bacteremia   . Respiratory failure ([place])   . CAP (community acquired pneumonia) 04/21/2018  . Corneal irritation of both eyes 10/31/2017  . Allergic rhinitis due to animal hair and dander 10/31/2017  . OSA on CPAP 10/10/2016  . OSA and COPD overlap syndrome ([place]) 03/02/2016  . Bronchitis, chronic obstructive, with exacerbation ([place]) 03/02/2016  . Hypoxemia 03/02/2016  . Dependence on supplemental oxygen 03/02/2016  . CAD, multiple vessel 03/02/2016  . Gastroesophageal reflux disease 03/02/2016  . HTN (hypertension), malignant 03/02/2016  . Cigarette smoker 02/22/2014  . DOE (dyspnea on exertion) 01/20/2014  . Obesity (BMI 30.0-34.9)   . Essential hypertension 11/24/2011  . Hyperlipidemia with target LDL less than 70 11/24/2011  . OSA (obstructive sleep apnea), uses oxygen at home did not tolerate cpap 11/24/2011  . COPD GOLD I  11/24/2011  . CAD S/P percutaneous coronary angioplasty -- D1, Xience Xpedition DES 2.5 mm x 33 mm 11/24/2011   Past Medical History:  Past Medical History:  Diagnosis Date  . CAD S/P percutaneous coronary angioplasty -- D1, Xience Xpedition DES 2.5 mm x 33 mm 11/24/2011   a) Mild-to-moderate 30-40% lesions in the RCA, LAD and Circumflex. b) CULPRIT LESION: long tubular 70-80% lesion in D1 with FFR of 0.7 --> PCI w/ Xience Xpedition  DES 2.5 mm x 30 mm (2.65 MM); c) Lexiscan Myoview 11/2013: No Ischemia or Infarct (Inferior Gut Attenuation) EF 63%.  Marland Kitchen COPD (chronic obstructive pulmonary disease) ([place])    "don't have full case of it; I'm right there at it"  . Essential hypertension 11/24/2011  . Exertional dyspnea, chronic   . Gout   . History of Unstable angina 11/24/2011   Referred for cardiac catheterization  . Hyperlipidemia with target LDL less than 70 11/24/2011  . Obesity (BMI 30.0-34.9)   . OSA (obstructive sleep apnea), uses oxygen at home did not tolerate cpap 11/24/2011   Past Surgical History:  Past Surgical History:  Procedure Laterality Date  . CERVICAL SPINE SURGERY  2012  . CORONARY ANGIOPLASTY WITH STENT PLACEMENT  11/23/2011   "1; first one"  . LEFT HEART CATHETERIZATION WITH CORONARY ANGIOGRAM N/A 11/23/2011   Procedure: LEFT HEART CATHETERIZATION WITH CORONARY ANGIOGRAM;  Surgeon: Leonie Man, MD;  Location[facility] LAB;  Service: Cardiovascular;  Laterality: N/A;    Assessment / Plan / Recommendation Clinical Impression   Paul [name] is a 68 year old right-handed male with history of CAD with angioplasty 2013 maintained on Plavix,diabetes mellitus, COPD, tobacco abuse and supplemental oxygen, hypertension. Per chart review, patient lives alone reportedly independent prior to admission. He does have a daughter in the area. Presented 04/21/2018 with increasing shortness of breath and altered mental status. Chest x-ray indicated of right upper lobe pneumonia and profound hypoxemia. Patient did require intubation. Hospital course complicated by  Blood cultures strep Pneumo bacteremia.  Patient was extubated 04/27/2018. Completing  a two-week course of ceftriaxone to end  on 05/05/2018.repeat blood cultures and respiratory cultures no growth to date. Noted bouts of agitation and restlessness requiring Precedex. Subcutaneous Lovenox for DVT prophylaxis. Patient is NPO with Cortrak tube in place. Swallow study completed  05/03/2018 dysphagia #1 honey thick liquids.Therapy evaluations completed with recommendations of physical medicine rehabilitation consult. Patient was admitted for a comprehensive rehabilitation program on 05/03/2018.  SLP evaluation was completed on 05/04/2018 with the following results:   Pt presents with s/s of a moderately severe dysphagia consistent with most recent MBS but exacerbated by acute respiratory decompensation and cognitive dysfunction.  Pt does not remember his swallowing precautions even when reminded of them in the moment and is impulsive with PO intake which leads to intermittent delayed coughing.  Cough is weak.  For now, recommend that pt remain on currently prescribed diet with full supervision for use of swallowing precautions and close monitoring of toleration.   Pt also presents with moderately severe cognitive deficits characterized by decreased recall of functional information, impaired orientation, decreased sustained attention to tasks, decreased functional problem solving, decreased safety awareness, and decreased intellectual awareness of his deficits.  Currently pt needs max assist to complete tasks due to the deficits mentioned above.   As a result, pt would benefit from skilled ST while inpatient in order to maximize functional independence and reduce burden of care prior to discharge.  Anticipate that pt will need 24/7 supervision at discharge in addition to [place] follow up at next level of care.    Skilled Therapeutic Interventions          Cognitive-linguistic and bedside swallow evaluation completed with results and recommendations reviewed with pt and his daughter.  SLP specifically reinforced rationale behind currently recommended swallowing precautions and diet.  All questions were answered to pt's and daughter's satisfaction at this time.       SLP Assessment  Patient will need skilled Speech Lanaguage Pathology Services during CIR admission    Recommendations  SLP  Diet Recommendations: Dysphagia 1 (Puree);Honey Liquid Administration via: Cup Medication Administration: Via alternative means Supervision: Patient able to self feed;Full supervision/cueing for compensatory strategies Compensations: Minimize environmental distractions;Slow rate;Small sips/bites;Multiple dry swallows after each bite/sip Postural Changes and/or Swallow Maneuvers: Out of bed for meals;Seated upright 90 degrees;Upright 30-60 min after meal Oral Care Recommendations: Oral care BID Recommendations for Other Services: Neuropsych consult Patient destination: Home Follow up Recommendations: Home Health SLP;Outpatient SLP;Skilled Nursing facility;24 hour supervision/assistance Equipment Recommended: None recommended by SLP    SLP Frequency 3 to 5 out of 7 days   SLP Duration  SLP Intensity  SLP Treatment/Interventions 14-21 days   Minumum of 1-2 x/day, 30 to 90 minutes  Cognitive remediation/compensation;Cueing hierarchy;Dysphagia/aspiration precaution training;Functional tasks;Patient/family education;Internal/external aids;Environmental controls    Pain Pain Assessment Pain Scale: Faces Faces Pain Scale: Hurts even more Pain Type: Acute pain Pain Location: Buttocks Pain Intervention(s): RN made aware  Prior Functioning Cognitive/Linguistic Baseline: Within functional limits Type of Home: House  Lives With: Alone Available Help at Discharge: Family Vocation: Retired  Industrial/product designer Term Goals: Week 1: SLP Short Term Goal 1 (Week 1): Pt will consumed dys 1 textures and honey thick liquids with mod assist for use of swallowing precautions and minimal overt s/s of aspiration.  SLP Short Term Goal 2 (Week 1): Pt will utilize external aids to reorient to place, date, and situation with mod assist multimodal cues.   SLP Short Term Goal 3 (Week 1): Pt will sustain his attention to  a basic, functional task for ~10 minutes with min cues for redirection.   SLP Short Term Goal 4 (Week  1): Pt will complete basic, familiar tasks with mod assist for functional problem solving.  SLP Short Term Goal 5 (Week 1): Pt will recall daily information with mod assist multimodal cues for use of external aids.   SLP Short Term Goal 6 (Week 1): Pt will return demonstration of at least 2 safety precautions with mod assist multimodal cues.    Refer to Care Plan for Long Term Goals  Recommendations for other services: Neuropsych  Discharge Criteria: Patient will be discharged from SLP if patient refuses treatment 3 consecutive times without medical reason, if treatment goals not met, if there is a change in medical status, if patient makes no progress towards goals or if patient is discharged from hospital.  The above assessment, treatment plan, treatment alternatives and goals were discussed and mutually agreed upon: by patient  Emilio Math 05/04/2018, 9:22 AM

## 2018-05-04 NOTE — Progress Notes (Addendum)
[place] PHYSICAL MEDICINE & REHABILITATION PROGRESS NOTE   Subjective/Complaints:  Per RN, patient restless last night.  Asking for Ambien.  Feels to sleep early this morning.  ROS: Limited due to cognitive/behavioral   Objective:   Dg Chest Port 1 View  Result Date: 05/03/2018 CLINICAL DATA:  Shortness of breath EXAM: PORTABLE CHEST 1 VIEW COMPARISON:  04/27/2018 FINDINGS: Endotracheal tube has been removed. Feeding catheter remains extending into the stomach. Postsurgical changes in the cervical spine are noted. Cardiac shadow is stable. Left lung remains clear. Persistent right upper lobe infiltrate is noted IMPRESSION: Stable right upper lobe infiltrate. Electronically Signed   By: Inez Catalina M.D.   On: 05/03/2018 08:18   Dg Swallowing Func-speech Pathology  Result Date: 05/03/2018 Objective Swallowing Evaluation: Type of Study: MBS-Modified Barium Swallow Study  Patient Details Name: Paul [name] MRN: [id number] Date of Birth: 1950-05-25 Today's Date: 05/03/2018 Time: SLP Start Time (ACUTE ONLY): 1330 -SLP Stop Time (ACUTE ONLY): 1346 SLP Time Calculation (min) (ACUTE ONLY): 16 min Past Medical History: Past Medical History: Diagnosis Date . CAD S/P percutaneous coronary angioplasty -- D1, Xience Xpedition DES 2.5 mm x 33 mm 11/24/2011  a) Mild-to-moderate 30-40% lesions in the RCA, LAD and Circumflex. b) CULPRIT LESION: long tubular 70-80% lesion in D1 with FFR of 0.7 --> PCI w/ Xience Xpedition DES 2.5 mm x 30 mm (2.65 MM); c) Lexiscan Myoview 11/2013: No Ischemia or Infarct (Inferior Gut Attenuation) EF 63%. Marland Kitchen COPD (chronic obstructive pulmonary disease) ([place])   "don't have full case of it; I'm right there at it" . Essential hypertension 11/24/2011 . Exertional dyspnea, chronic  . Gout  . History of Unstable angina 11/24/2011  Referred for cardiac catheterization . Hyperlipidemia with target LDL less than 70 11/24/2011 . Obesity (BMI 30.0-34.9)  . OSA (obstructive sleep apnea), uses oxygen at home  did not tolerate cpap 11/24/2011 Past Surgical History: Past Surgical History: Procedure Laterality Date . CERVICAL SPINE SURGERY  2012 . CORONARY ANGIOPLASTY WITH STENT PLACEMENT  11/23/2011  "1; first one" . LEFT HEART CATHETERIZATION WITH CORONARY ANGIOGRAM N/A 11/23/2011  Procedure: LEFT HEART CATHETERIZATION WITH CORONARY ANGIOGRAM;  Surgeon: Leonie Man, MD;  Location: [facility] CATH LAB;  Service: Cardiovascular;  Laterality: N/A; HPI: 68 year old man with COPD on home oxygen admitted 1/5 with right upper lobe community-acquired pneumonia requiring mechanical ventilation. Intubated until 1/11.  No data recorded Assessment / Plan / Recommendation CHL IP CLINICAL IMPRESSIONS 05/03/2018 Clinical Impression Pt demonstrates severe oral and oropharyngeal dysphagia with dealyed swallow initaition and moderate to severe sensed aspiration events before the swallow with thin and nectar thick liquids.  Cough is not effective to clear.  There is also moderate generalized weakness with vallecular and pyriform residuals which may be more significant due to presennce of NG tube and decreased epiglottic inversion. Chin tuck cannot be fully completed due to present of cervical fusion, though slight forward head posture was better than posterior lean that pt seems to prefer. Pt can tolerate small sips of honey thick liquids and puree with cues for a second swallow. Expect improvement as oral mucosa before better hydrated for better oral control and sensation and when vocal quality and cough strength improved. Would not advise removal of NG tube until tolerance is established.  SLP Visit Diagnosis Dysphagia, oropharyngeal phase (R13.12) Attention and concentration deficit following -- Frontal lobe and executive function deficit following -- Impact on safety and function Moderate aspiration risk   CHL IP TREATMENT RECOMMENDATION 05/03/2018 Treatment Recommendations Defer treatment  plan to f/u with SLP   No flowsheet data found. CHL IP DIET  RECOMMENDATION 05/03/2018 SLP Diet Recommendations Dysphagia 1 (Puree) solids;Honey thick liquids Liquid Administration via Spoon;Cup Medication Administration Crushed with puree Compensations Slow rate;Small sips/bites;Multiple dry swallows after each bite/sip Postural Changes --   CHL IP OTHER RECOMMENDATIONS 05/03/2018 Recommended Consults -- Oral Care Recommendations Oral care QID Other Recommendations Order thickener from pharmacy;Have oral suction available   CHL IP FOLLOW UP RECOMMENDATIONS 05/03/2018 Follow up Recommendations Inpatient Rehab   No flowsheet data found.     CHL IP ORAL PHASE 05/03/2018 Oral Phase Impaired Oral - Pudding Teaspoon -- Oral - Pudding Cup -- Oral - Honey Teaspoon Decreased bolus cohesion;Lingual/palatal residue Oral - Honey Cup -- Oral - Nectar Teaspoon Decreased bolus cohesion;Lingual/palatal residue;Premature spillage Oral - Nectar Cup Decreased bolus cohesion;Lingual/palatal residue;Premature spillage Oral - Nectar Straw -- Oral - Thin Teaspoon Decreased bolus cohesion;Lingual/palatal residue;Premature spillage Oral - Thin Cup -- Oral - Thin Straw -- Oral - Puree Decreased bolus cohesion;Lingual/palatal residue Oral - Mech Soft -- Oral - Regular -- Oral - Multi-Consistency -- Oral - Pill -- Oral Phase - Comment --  CHL IP PHARYNGEAL PHASE 05/03/2018 Pharyngeal Phase Impaired Pharyngeal- Pudding Teaspoon -- Pharyngeal -- Pharyngeal- Pudding Cup -- Pharyngeal -- Pharyngeal- Honey Teaspoon Delayed swallow initiation-vallecula;Reduced epiglottic inversion;Reduced tongue base retraction;Pharyngeal residue - valleculae;Pharyngeal residue - pyriform Pharyngeal -- Pharyngeal- Honey Cup -- Pharyngeal -- Pharyngeal- Nectar Teaspoon Delayed swallow initiation-pyriform sinuses;Penetration/Aspiration before swallow;Significant aspiration (Amount);Pharyngeal residue - valleculae;Pharyngeal residue - pyriform;Reduced tongue base retraction;Reduced epiglottic inversion;Inter-arytenoid space residue  Pharyngeal Material enters airway, passes BELOW cords and not ejected out despite cough attempt by patient Pharyngeal- Nectar Cup Delayed swallow initiation-pyriform sinuses;Penetration/Aspiration before swallow;Significant aspiration (Amount);Pharyngeal residue - valleculae;Pharyngeal residue - pyriform;Reduced tongue base retraction;Reduced epiglottic inversion;Inter-arytenoid space residue Pharyngeal Material enters airway, passes BELOW cords and not ejected out despite cough attempt by patient Pharyngeal- Nectar Straw -- Pharyngeal -- Pharyngeal- Thin Teaspoon Delayed swallow initiation-pyriform sinuses;Penetration/Aspiration before swallow;Significant aspiration (Amount);Pharyngeal residue - valleculae;Pharyngeal residue - pyriform;Reduced tongue base retraction;Reduced epiglottic inversion;Inter-arytenoid space residue Pharyngeal Material enters airway, passes BELOW cords and not ejected out despite cough attempt by patient Pharyngeal- Thin Cup -- Pharyngeal -- Pharyngeal- Thin Straw -- Pharyngeal -- Pharyngeal- Puree Delayed swallow initiation-vallecula;Reduced epiglottic inversion;Reduced tongue base retraction;Pharyngeal residue - valleculae;Pharyngeal residue - pyriform Pharyngeal -- Pharyngeal- Mechanical Soft -- Pharyngeal -- Pharyngeal- Regular -- Pharyngeal -- Pharyngeal- Multi-consistency -- Pharyngeal -- Pharyngeal- Pill -- Pharyngeal -- Pharyngeal Comment --  No flowsheet data found. Herbie Baltimore, MA CCC-SLP Acute Rehabilitation Services Pager [phone] Office [phone] Lynann Beaver 05/03/2018, 5:42 PM              Recent Labs    05/02/18 0545 05/03/18 0556  WBC 10.7* 11.4*  HGB 12.6* 12.3*  HCT 40.5 40.1  PLT 309 272   Recent Labs    05/02/18 0545 05/03/18 0556  NA 147* 146*  K 3.6 4.4  CL 112* 111  CO2 26 26  GLUCOSE 235* 224*  BUN 21 20  CREATININE 0.75 0.83  CALCIUM 8.7* 8.5*    Intake/Output Summary (Last 24 hours) at 05/04/2018 0830 Last data filed  at 05/03/2018 1900 Gross per 24 hour  Intake 0 ml  Output -  Net 0 ml     Physical Exam: Vital Signs Blood pressure 129/65, pulse (!) 103, temperature 98 F (36.7 C), temperature source Oral, resp. rate 18, height 5\' 6"  (1.676 m), weight 75.8 kg, SpO2 95 %. Constitutional: No distress .  Vital signs reviewed. HEENT: EOMI, oral membranes moist, NGT in place Neck: supple Cardiovascular: RRR without murmur. No JVD    Respiratory: CTA Bilaterally without wheezes or rales. Normal effort    GI: BS +, non-tender, non-distended  Neurological:   Patient is sleeping, slow to arouse.   He does make eye contact. Dysarthric.  Follows some simple commands.  Moves all fours but inconsistent. Senses pain in all 4's.       Assessment/Plan: 1. Functional deficits secondary to hypoxic encephalopathy and debility which require 3+ hours per day of interdisciplinary therapy in a comprehensive inpatient rehab setting.  Physiatrist is providing close team supervision and 24 hour management of active medical problems listed below.  Physiatrist and rehab team continue to assess barriers to discharge/monitor patient progress toward functional and medical goals  Care Tool:  Bathing              Bathing assist       Upper Body Dressing/Undressing Upper body dressing   What is the patient wearing?: Hospital gown only    Upper body assist Assist Level: Total Assistance - Patient < 25%    Lower Body Dressing/Undressing Lower body dressing            Lower body assist       Toileting Toileting    Toileting assist Assist for toileting: Total Assistance - Patient < 25%     Transfers Chair/bed transfer  Transfers assist     Chair/bed transfer assist level: Maximal Assistance - Patient 25 - 49%     Locomotion Ambulation   Ambulation assist              Walk 10 feet activity   Assist           Walk 50 feet activity   Assist           Walk 150 feet  activity   Assist           Walk 10 feet on uneven surface  activity   Assist           Wheelchair     Assist               Wheelchair 50 feet with 2 turns activity    Assist            Wheelchair 150 feet activity     Assist           Medical Problem List and Plan: 1.  Encephalopathy/debility secondary to  Acute hypoxic respiratory failure/RUL pneumonia complicated by history of COPD oxygen dependent. Continue nebulizers as directed           -Beginning therapies today 2.  DVT Prophylaxis/Anticoagulation: Lovenox. Monitor for any bleeding episodes 3. Pain Management:  Tylenol as needed 4. Mood/delirium:  Provide emotional support  -Initiate trazodone 50 mg nightly for sleep  -Keep sleep chart 5. Neuropsych: This patient is capable of making decisions on his own behalf. 6. Skin/Wound Care:  Routine skin checks 7. Fluids/Electrolytes/Nutrition:  Routine in and out's with follow-up chemistries 8. Dysphagia.Cortrak tube: Swallow study 05/03/2018 dysphagia #1 honey liquids.  -Have initiated diet with supervision  -If patient taking in enough by mouth and alert enough, we can discontinue NG tube   - Follow-up speech therapy for advancement of diet 9.ID/Strep Pneumo Bacteremia. Continue ceftriaxone through 05/05/2018 and stop  -Afebrile at present 10. CAD with stenting. Plavix. 11. Hypertension. Clonidine 0.1 mg daily, Toprol 50 mg daily  -Blood pressure  controlled 1/18 12. Diabetes mellitus with peripheral neuropathy. Levemir 10 units twice a day. Check blood sugars before meals and at bedtime. Patient on Glucophage 500 mg daily prior to admission             -Poor control at present given tube feeds  -Increase Levemir to 14 units twice daily, further adjust if he moves to p.o. diet alone 13. History of gout. Monitor for any flareups 14. Constipation. Laxative assistance.   LOS: 1 days A FACE TO [place] 05/04/2018, 8:30 AM

## 2018-05-04 NOTE — Progress Notes (Signed)
Initial Nutrition Assessment  DOCUMENTATION CODES:   Not applicable  INTERVENTION:   Tube feeding via Cortrak:  - Osmolite 1.5 at 65 ml/hr to run over 20 hours (1300 ml/day)  Note: TF can be held for up to 4 hours for therapies.  - Pro-Stat 30 ml BID - Free water 100 ml q 4 hours (increased from 100 ml q 8 hours)  TF regimen and current free water provides 111 grams of protein, 2150 kcals, and 1588 ml of free water (98% of kcal needs, 97% of protein needs).  NUTRITION DIAGNOSIS:   Increased nutrient needs related to other (therapies), chronic illness (COPD) as evidenced by estimated needs.  GOAL:   Patient will meet greater than or equal to 90% of their needs  MONITOR:   PO intake, Weight trends, Skin, TF tolerance, Diet advancement, Labs  REASON FOR ASSESSMENT:   Consult Enteral/tube feeding initiation and management  ASSESSMENT:   68 year old male who presented to the ED on 04/21/18 with SOB and AMS. Pt admitted with acute respiratory failure related to RUL pneumonia requiring intubation on admission, AKI, and DKA. PMH significant for COPD on home oxygen, CAD, HTN, DM, tobacco use. Pt extubated on 04/27/18 and Cortrak placed for TF on 04/29/18. Diet advanced to dysphagia 1 with honey-thick liquids after MBS on 05/03/18.  Spoke with pt at bedside. RN in room providing nursing care at time of visit. Discussed tube feeding regimen with RN.  Pt not connected to tube feeding at time of visit. RN requesting IV pump to hang TF bottle. Cortrak in place. No evidence of skin breakdown around Cortrak tube.  Pt reports that he is feeling well and that he has a good appetite. Noted ~50% completed D1/honey breakfast tray at bedside. Pt reports that he is supposed to eat slowly and take smaller bites. RD encouraged adequate PO intake at meals and discussed possibility of d/c Cortrak if PO intake improves and pt is able to eat and drink enough by mouth to meet his nutritional needs. Pt  reports that he would like to have the Cortrak removed.  Pt reports having a good appetite PTA and eating 2-3 meals daily. Pt states that he was eating "too much, probably" and that his favorite foods were ice cream and strawberry milkshakes. Pt reports that he eats meat and vegetables "on occasion." Pt not able to elaborate much on PO intake PTA.  Pt reports that he is not active at home and that his weight has been stable between 190-200 lbs. Pt does not believe that he has lost weight. However, current weight is 167 lbs. Per weight history in chart, pt with 4.9 kg weight loss since 10/31/17. This is a 6.1% weight loss which is not significant for timeframe. Will monitor weight trends during admission.  Current TF: Osmolite 1.5 @ 55 ml/hr, free water 100 ml q 8 hours  Medications reviewed and include: SSI q 4 hours, Novolog 3 units q 4 hours, Levemir 14 units BID, Senna, IV Rocephin  Labs reviewed: sodium 146 (H), elevated LFTs Hemoglobin A1C 11.8 (H) CBG's: 161, 180, 206, 237, 171, 164 x 24 hours  NUTRITION - FOCUSED PHYSICAL EXAM:    Most Recent Value  Orbital Region  Mild depletion  Upper Arm Region  Moderate depletion  Thoracic and Lumbar Region  No depletion  Buccal Region  No depletion  Temple Region  No depletion  Clavicle Bone Region  No depletion  Clavicle and Acromion Bone Region  Mild depletion  Scapular Bone  Region  No depletion  Dorsal Hand  No depletion  Patellar Region  Mild depletion  Anterior Thigh Region  Moderate depletion  Posterior Calf Region  Moderate depletion  Edema (RD Assessment)  None  Hair  Reviewed  Eyes  Reviewed  Mouth  Reviewed  Skin  Reviewed  Nails  Reviewed     Suspect muscle wasting in BLE related to lack of activity PTA vs malnutrition.  Diet Order:   Diet Order            DIET - DYS 1 Room service appropriate? No; Fluid consistency: Honey Thick  Diet effective now              EDUCATION NEEDS:   Not appropriate for education  at this time  Skin:  Skin Assessment: Skin Integrity Issues: Stage I: R [name] (from ETT) Other: skin tear to scrotum  Last BM:  1/15  Height:   Ht Readings from Last 1 Encounters:  05/03/18 5\' 6"  (1.676 m)    Weight:   Wt Readings from Last 10 Encounters:  05/04/18 75.8 kg  05/02/18 79.8 kg  10/31/17 80.7 kg  10/10/16 84.4 kg  03/02/16 83 kg  04/13/14 91.2 kg  02/19/14 88.5 kg  01/19/14 90.6 kg  12/04/13 90.3 kg  11/25/13 90.5 kg    Ideal Body Weight:  64.5 kg  BMI:  Body mass index is 26.97 kg/m.  Estimated Nutritional Needs:   Kcal:  2200-2400  Protein:  115-130 grams  Fluid:  >/= 2.0 L    Paul Face, MS, RD, LDN Inpatient Clinical Dietitian Pager: [phone] Weekend/After Hours: [phone]

## 2018-05-04 NOTE — Evaluation (Signed)
Occupational Therapy Assessment and Plan  Patient Details  Name: Paul [name] MRN: [id number] Date of Birth: 1950-06-08  OT Diagnosis: acute pain, cognitive deficits and muscle weakness (generalized) Rehab Potential: Rehab Potential (ACUTE ONLY): Good ELOS: 2.5-3 weeks   Today's Date: 05/04/2018 OT Individual Time: 1300-1416 OT Individual Time Calculation (min): 76 min     Problem List:  Patient Active Problem List   Diagnosis Date Noted  . Acute encephalopathy 05/03/2018  . Pressure injury of skin 04/30/2018  . Coronary artery disease involving native coronary artery of native heart without angina pectoris   . Tobacco abuse   . Hypertension   . Acute blood loss anemia   . Bacteremia   . Respiratory failure ([place])   . CAP (community acquired pneumonia) 04/21/2018  . Corneal irritation of both eyes 10/31/2017  . Allergic rhinitis due to animal hair and dander 10/31/2017  . OSA on CPAP 10/10/2016  . OSA and COPD overlap syndrome ([place]) 03/02/2016  . Bronchitis, chronic obstructive, with exacerbation ([place]) 03/02/2016  . Hypoxemia 03/02/2016  . Dependence on supplemental oxygen 03/02/2016  . CAD, multiple vessel 03/02/2016  . Gastroesophageal reflux disease 03/02/2016  . HTN (hypertension), malignant 03/02/2016  . Cigarette smoker 02/22/2014  . DOE (dyspnea on exertion) 01/20/2014  . Obesity (BMI 30.0-34.9)   . Essential hypertension 11/24/2011  . Hyperlipidemia with target LDL less than 70 11/24/2011  . OSA (obstructive sleep apnea), uses oxygen at home did not tolerate cpap 11/24/2011  . COPD GOLD I  11/24/2011  . CAD S/P percutaneous coronary angioplasty -- D1, Xience Xpedition DES 2.5 mm x 33 mm 11/24/2011    Past Medical History:  Past Medical History:  Diagnosis Date  . CAD S/P percutaneous coronary angioplasty -- D1, Xience Xpedition DES 2.5 mm x 33 mm 11/24/2011   a) Mild-to-moderate 30-40% lesions in the RCA, LAD and Circumflex. b) CULPRIT LESION: long tubular 70-80%  lesion in D1 with FFR of 0.7 --> PCI w/ Xience Xpedition DES 2.5 mm x 30 mm (2.65 MM); c) Lexiscan Myoview 11/2013: No Ischemia or Infarct (Inferior Gut Attenuation) EF 63%.  Marland Kitchen COPD (chronic obstructive pulmonary disease) ([place])    "don't have full case of it; I'm right there at it"  . Essential hypertension 11/24/2011  . Exertional dyspnea, chronic   . Gout   . History of Unstable angina 11/24/2011   Referred for cardiac catheterization  . Hyperlipidemia with target LDL less than 70 11/24/2011  . Obesity (BMI 30.0-34.9)   . OSA (obstructive sleep apnea), uses oxygen at home did not tolerate cpap 11/24/2011   Past Surgical History:  Past Surgical History:  Procedure Laterality Date  . CERVICAL SPINE SURGERY  2012  . CORONARY ANGIOPLASTY WITH STENT PLACEMENT  11/23/2011   "1; first one"  . LEFT HEART CATHETERIZATION WITH CORONARY ANGIOGRAM N/A 11/23/2011   Procedure: LEFT HEART CATHETERIZATION WITH CORONARY ANGIOGRAM;  Surgeon: Leonie Man, MD;  Location[facility] C[facility] LAB;  Service: Cardiovascular;  Laterality: N/A;    Assessment & Plan Clinical Impression: Paul [name] is a 68 year old right-handed male with history of CAD with angioplasty 2013 maintained on Plavix,diabetes mellitus, COPD, tobacco abuse and supplemental oxygen, hypertension. Per chart review, patient lives alone reportedly independent prior to admission. He does have a daughter in the area. Presented 04/21/2018 with increasing shortness of breath and altered mental status. Chest x-ray indicated of right upper lobe pneumonia and profound hypoxemia. Patient did require intubation. Hospital course complicated by  Blood cultures strep Pneumo bacteremia.  Patient was extubated 04/27/2018. Completing a two-week course of ceftriaxone to end  on 05/05/2018.repeat blood cultures and respiratory cultures no growth to date. Noted bouts of agitation and restlessness requiring Precedex. Subcutaneous Lovenox for DVT prophylaxis. Patient is NPO with Cortrak  tube in place. Swallow study completed 05/03/2018 dysphagia #1 honey thick liquids.Therapy evaluations completed with recommendations of physical medicine rehabilitation consult. Patient was admitted for a comprehensive rehabilitation program.    Patient currently requires mod-max with basic self-care skills secondary to muscle weakness, decreased cardiorespiratoy endurance, decreased attention, decreased awareness, decreased problem solving, decreased safety awareness and decreased memory and decreased sitting balance, decreased standing balance and decreased balance strategies.  Prior to hospitalization, patient could complete BADLs with independent .  Patient will benefit from skilled intervention to increase independence with basic self-care skills prior to discharge home with care partner.  Anticipate patient will require 24 hour supervision and follow up home health.  OT - End of Session Endurance Deficit: Yes OT Assessment Rehab Potential (ACUTE ONLY): Good OT Barriers to Discharge: Decreased caregiver support;Medical stability;Lack of/limited family support;Behavior OT Patient demonstrates impairments in the following area(s): Balance;Behavior;Cognition;Vision;Endurance;Motor;Safety OT Basic ADL's Functional Problem(s): Grooming;Bathing;Dressing;Toileting OT Advanced ADL's Functional Problem(s): Simple Meal Preparation OT Transfers Functional Problem(s): Tub/Shower;Toilet OT Additional Impairment(s): None OT Plan OT Intensity: Minimum of 1-2 x/day, 45 to 90 minutes OT Duration/Estimated Length of Stay: 2.5-3 weeks OT Treatment/Interventions: Balance/vestibular training;Disease mangement/prevention;Self Care/advanced ADL retraining;Therapeutic Exercise;Wheelchair propulsion/positioning;UE/LE Strength taining/ROM;DME/adaptive equipment instruction;Cognitive remediation/compensation;Pain management;Community reintegration;Patient/family education;UE/LE Coordination activities;Therapeutic  Activities;Psychosocial support;Functional mobility training;Discharge planning OT Self Feeding Anticipated Outcome(s): No goal OT Basic Self-Care Anticipated Outcome(s): Supervision/cuing  OT Toileting Anticipated Outcome(s): Supervision/cuing  OT Bathroom Transfers Anticipated Outcome(s): Supervision/cuing  OT Recommendation Recommendations for Other Services: Therapeutic Recreation consult Therapeutic Recreation Interventions: Pet therapy Patient destination: Home Follow Up Recommendations: Home health OT Equipment Recommended: To be determined  Skilled Therapeutic Intervention Skilled OT session completed with focus on initial evaluation, education on OT role/POC, and establishment of patient-centered goals. Pt greeted in w/c. He completed bathing/dressing (sit<stand at sink) and toileting (sit<stand from low toilet ) during session. Brother Ulice Dash was present to provide reliable PLOF and d/c information. Pt required vcs for safety awareness and to slow down, as he tended to be impulsive with task completion and sit<stands. He exhibited quite a bit of SOB. 02 sats 91-93% on 2L during tx. Pt required Mod A sit<stand at sink, and Max A sit<stand from low toilet post B+B void. He was able to complete perihygiene with RW and Mod A for standing balance. Max A for LB dressing due to fatigue post toileting. Stand pivot<bed completed with Mod A. He transitioned to supine with Mod A and was left with RT for breathing treatment.   OT Evaluation Precautions/Restrictions  Precautions Precautions: Fall Precaution Comments: Supplemental 02 Restrictions Weight Bearing Restrictions: No General Chart Reviewed: Yes Family/Caregiver Present: Yes(brother Jay) Vital Signs Therapy Vitals Temp: 97.8 F (36.6 C) Temp Source: Oral Pulse Rate: 97 Resp: 20 BP: 126/66 Patient Position (if appropriate): Lying Oxygen Therapy SpO2: 93 % O2 Device: Room Air O2 Flow Rate (L/min): 2 L/min Pain: Buttocks pain.  RN made aware. At end of session pt was repositioned in sidelying in bed for pressure relief.    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, 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, and did Media planner trading  Leisure and Hobbies: [place] and spending time with Materials engineer Prior Function Level of Independence: Independent with homemaking with ambulation, Independent with gait, Independent with transfers  Able to Take Stairs?: Yes Driving: Yes Vocation: Retired ADL ADL Eating: Not assessed Grooming: Not assessed Upper Body Bathing: Supervision/safety Where Assessed-Upper Body Bathing: Sitting at [place]: Moderate assistance Where Assessed-Lower Body Bathing: Wheelchair, Sitting at sink, Standing at sink Upper Body Dressing: Moderate assistance Where Assessed-Upper Body Dressing: Wheelchair, Sitting at sink Lower Body Dressing: Maximal assistance Where Assessed-Lower Body Dressing: Standing at sink, Sitting at sink, Wheelchair Toileting: Moderate assistance Where Assessed-Toileting: Glass blower/designer: Maximal Print production planner Method: Stand step using RW Tub/Shower Transfer: Not assessed Vision Baseline Vision/History: Wears glasses Wears Glasses: At all times Patient Visual Report: No change from baseline Vision Assessment?:  Yes(Unable to read print on ADL items; per pt 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 Advised brother Ulice Dash to bring in his glasses) Perception  Perception: Within Functional Limits Praxis Praxis: Intact Cognition Overall Cognitive Status: Impaired/Different from baseline Arousal/Alertness: Awake/alert Orientation Level: Person;Place Year: 2020 Month: January Day of Week: Incorrect Memory: Impaired Memory Impairment: Decreased recall of new information Immediate Memory Recall: Sock;Blue Memory Recall: Sock;Blue;Bed Memory Recall Sock: With Cue Memory Recall Blue: With Cue Memory Recall Bed: With Cue Attention: Sustained Sustained Attention: Impaired Sustained Attention Impairment: Functional basic;Verbal basic Awareness: Impaired Awareness Impairment: Intellectual impairment Problem Solving: Impaired Problem Solving Impairment: Functional basic;Verbal basic Executive Function: (all impaired due to lower level deficits) Behaviors: Restless;Impulsive Safety/Judgment: Impaired Sensation Sensation Light Touch: Appears Intact(B UEs) Coordination Gross Motor Movements are Fluid and Coordinated: No Fine Motor Movements are Fluid and Coordinated: No Coordination and Movement Description: Slow and laborious  Motor  Motor Motor: Abnormal postural alignment and control Motor - Skilled Clinical Observations: generalized deconditioning & UE/LE weakness Mobility  Transfers Sit to Stand: Maximal Assistance - Patient 25-49% Stand to Sit: Maximal Assistance - Patient 25-49%  Trunk/Postural Assessment     Balance Balance Balance Assessed: Yes Dynamic Sitting Balance Dynamic Sitting - Level of Assistance: 4: Min assist(Perihygiene completion on toilet) Dynamic Standing Balance Dynamic Standing - Balance Support: During functional activity;No upper extremity supported Dynamic Standing - Level of Assistance: 3: Mod assist Dynamic Standing - Comments: Elevating pants over  hips Extremity/Trunk Assessment RUE Assessment RUE Assessment: Within Functional Limits Active Range of Motion (AROM) Comments: Shoulder flexion/abduction <90 degrees and limited ER. 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   General Strength Comments: 3-/5 deltoids, 4-/5 biceps/triceps  LUE Assessment LUE Assessment: Within Functional Limits Active Range of Motion (AROM) Comments: Shoulder flexion/abduction <90 degrees and limited ER. 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   General Strength Comments: 3-/5 deltoids, 4-/5 biceps/triceps   Refer to Care Plan for Long Term Goals  Recommendations for other services: Therapeutic Recreation  Pet therapy   Discharge Criteria: Patient will be discharged from OT if patient refuses treatment 3 consecutive times without medical reason, if treatment goals not met, if there is a change in medical status, if patient makes no progress towards goals or if patient is discharged from hospital.  The above assessment, treatment plan, treatment  alternatives and goals were discussed and mutually agreed upon: by patient and by family  Skeet Simmer 05/04/2018, 4:32 PM

## 2018-05-04 NOTE — Evaluation (Addendum)
Physical Therapy Assessment and Plan  Patient Details  Name: Paul [name] MRN: [id number] Date of Birth: 1950/05/08  PT Diagnosis: Abnormality of gait, Cognitive deficits, Difficulty walking, Impaired cognition and Muscle weakness Rehab Potential: Good ELOS: 2.5-3 weeks   Today's Date: 05/04/2018 PT Individual Time: 1107-1202 PT Individual Time Calculation (min): 55 min    Problem List:  Patient Active Problem List   Diagnosis Date Noted  . Acute encephalopathy 05/03/2018  . Pressure injury of skin 04/30/2018  . Coronary artery disease involving native coronary artery of native heart without angina pectoris   . Tobacco abuse   . Hypertension   . Acute blood loss anemia   . Bacteremia   . Respiratory failure ([place])   . CAP (community acquired pneumonia) 04/21/2018  . Corneal irritation of both eyes 10/31/2017  . Allergic rhinitis due to animal hair and dander 10/31/2017  . OSA on CPAP 10/10/2016  . OSA and COPD overlap syndrome ([place]) 03/02/2016  . Bronchitis, chronic obstructive, with exacerbation ([place]) 03/02/2016  . Hypoxemia 03/02/2016  . Dependence on supplemental oxygen 03/02/2016  . CAD, multiple vessel 03/02/2016  . Gastroesophageal reflux disease 03/02/2016  . HTN (hypertension), malignant 03/02/2016  . Cigarette smoker 02/22/2014  . DOE (dyspnea on exertion) 01/20/2014  . Obesity (BMI 30.0-34.9)   . Essential hypertension 11/24/2011  . Hyperlipidemia with target LDL less than 70 11/24/2011  . OSA (obstructive sleep apnea), uses oxygen at home did not tolerate cpap 11/24/2011  . COPD GOLD I  11/24/2011  . CAD S/P percutaneous coronary angioplasty -- D1, Xience Xpedition DES 2.5 mm x 33 mm 11/24/2011    Past Medical History:  Past Medical History:  Diagnosis Date  . CAD S/P percutaneous coronary angioplasty -- D1, Xience Xpedition DES 2.5 mm x 33 mm 11/24/2011   a) Mild-to-moderate 30-40% lesions in the RCA, LAD and Circumflex. b) CULPRIT LESION: long tubular 70-80%  lesion in D1 with FFR of 0.7 --> PCI w/ Xience Xpedition DES 2.5 mm x 30 mm (2.65 MM); c) Lexiscan Myoview 11/2013: No Ischemia or Infarct (Inferior Gut Attenuation) EF 63%.  Marland Kitchen COPD (chronic obstructive pulmonary disease) ([place])    "don't have full case of it; I'm right there at it"  . Essential hypertension 11/24/2011  . Exertional dyspnea, chronic   . Gout   . History of Unstable angina 11/24/2011   Referred for cardiac catheterization  . Hyperlipidemia with target LDL less than 70 11/24/2011  . Obesity (BMI 30.0-34.9)   . OSA (obstructive sleep apnea), uses oxygen at home did not tolerate cpap 11/24/2011   Past Surgical History:  Past Surgical History:  Procedure Laterality Date  . CERVICAL SPINE SURGERY  2012  . CORONARY ANGIOPLASTY WITH STENT PLACEMENT  11/23/2011   "1; first one"  . LEFT HEART CATHETERIZATION WITH CORONARY ANGIOGRAM N/A 11/23/2011   Procedure: LEFT HEART CATHETERIZATION WITH CORONARY ANGIOGRAM;  Surgeon: Leonie Man, MD;  Location: [facility] CATH LAB;  Service: Cardiovascular;  Laterality: N/A;    Assessment & Plan Clinical Impression: Patient is a 68 y.o. year old right-handed male with history of CAD with angioplasty 2013 maintained on Plavix,diabetes mellitus, COPD, tobacco abuse and supplemental oxygen, hypertension. Per chart review, patient lives alone reportedly independent prior to admission. He does have a daughter in the area. Presented 04/21/2018 with increasing shortness of breath and altered mental status. Chest x-ray indicated of right upper lobe pneumonia and profound hypoxemia. Patient did require intubation. Hospital course complicated by  Blood cultures strep Pneumo bacteremia.  Patient was extubated 04/27/2018. Completing a two-week course of ceftriaxone to end  on 05/05/2018.repeat blood cultures and respiratory cultures no growth to date. Noted bouts of agitation and restlessness requiring Precedex. Subcutaneous Lovenox for DVT prophylaxis. Patient is NPO with  Cortrak tube in place. Swallow study completed 05/03/2018 dysphagia #1 honey thick liquids.Therapy evaluations completed with recommendations of physical medicine rehabilitation consult. Patient was admitted for a comprehensive rehabilitation program..  Patient transferred to CIR on 05/03/2018 .   Patient currently requires mod with mobility secondary to muscle weakness, decreased cardiorespiratoy endurance and decreased oxygen support, decreased coordination, decreased attention, decreased awareness, decreased problem solving, decreased safety awareness, decreased memory and delayed processing, and decreased sitting balance, decreased standing balance, decreased postural control and decreased balance strategies.  Prior to hospitalization, patient was independent  with mobility and lived with Alone in a House home.  Home access is 3Stairs to enter.  Patient will benefit from skilled PT intervention to maximize safe functional mobility, minimize fall risk and decrease caregiver burden for planned discharge home with 24 hour supervision.  Anticipate patient will benefit from follow up [place] at discharge.  PT - End of Session Activity Tolerance: Tolerates 30+ min activity with multiple rests Endurance Deficit: Yes Endurance Deficit Description: 2/2 decreased cardiopulmonary endurance, generalized weakness PT Assessment Rehab Potential (ACUTE/IP ONLY): Good PT Barriers to Discharge: [place] home environment;Decreased caregiver support;Home environment access/layout;Lack of/limited family support;Nutrition means;Behavior PT Barriers to Discharge Comments: unsure if pt has B rails or no rails (pt & chart conflict), cognitive deficits PT Patient demonstrates impairments in the following area(s): Balance;Motor;Safety;Behavior;Nutrition;Sensory;Pain;Skin Integrity;Endurance;Perception PT Transfers Functional Problem(s): Bed Mobility;Bed to Chair;Car;Furniture PT Locomotion Functional Problem(s):  Ambulation;Stairs;Wheelchair Mobility PT Plan PT Intensity: Minimum of 1-2 x/day ,45 to 90 minutes PT Frequency: 5 out of 7 days PT Duration Estimated Length of Stay: 2.5-3 weeks PT Treatment/Interventions: Ambulation/gait training;Cognitive remediation/compensation;Discharge planning;DME/adaptive equipment instruction;Pain management;Functional mobility training;Psychosocial support;Splinting/orthotics;Therapeutic Activities;UE/LE Strength taining/ROM;Visual/perceptual remediation/compensation;Wheelchair propulsion/positioning;UE/LE Coordination activities;Therapeutic Exercise;Stair training;Skin care/wound management;Patient/family education;Neuromuscular re-education;Functional electrical stimulation;Community reintegration;Disease management/prevention;Balance/vestibular training PT Transfers Anticipated Outcome(s): supervision with LRAD PT Locomotion Anticipated Outcome(s): supervision with LRAD except CGA for stairs with B rails PT Recommendation Recommendations for Other Services: Speech consult;Neuropsych consult;Therapeutic Recreation consult Therapeutic Recreation Interventions: Stress management Follow Up Recommendations: Home health PT;24 hour supervision/assistance Patient destination: Home Equipment Recommended: Wheelchair (measurements);Rolling walker with 5" wheels  Skilled Therapeutic Intervention Patient received in recliner with ongoing c/o buttock pain & RN made aware & administered meds during session. Provided pt with w/c and partial roho cushion for increased comfort & OOB tolerance. Pt requested something to drink & therapist provides supervision and cuing for 2 swallows between each small sip per SLP recommendations while pt consumes thickened liquid. Assisted pt with donning clean gown max assist for time management. Pt transfers sit>stand with stedy & min assist to transfer into w/c with use of device. In gym, pt transfers sit>stand with min assist with max cuing for hand  placement to push up on BUE armrests. Pt attempts ambulation x 2 steps with mod assist + 2nd person for w/c follow & oxygen tank management before requesting to sit 2/2 fatigue. Pt propels w/c in multiple bouts of 20 ft max with BUE and supervision. At end of session pt left sitting in w/c in room with chair alarm donned & call bell in reach - reviewed use of call bell with pt with pt able to demonstrate.   SpO2 >95% throughout session 2L/min supplemental oxygen via nasal cannula.   Educated pt on  ELOS.  PT Evaluation Precautions/Restrictions Precautions Precautions: Fall Precaution Comments: supplemental oxygen Restrictions Weight Bearing Restrictions: No  General Chart Reviewed: Yes Additional Pertinent History: CAD, tobacco abuse, HTN, OSA on CPAP, supplemental oxygen, HLD, obesity, COPD, gout, DOE, c-spine surgery in 2012, DM, angina Response to Previous Treatment: Patient with no complaints from previous session. Family/Caregiver Present: No   Home Living/Prior Functioning Home Living Available Help at Discharge: Family;Available PRN/intermittently Type of Home: House Home Access: Stairs to enter [employer] of Steps: 3 Entrance Stairs-Rails: Right;Left;Can reach both Home Layout: One level  Lives With: Alone Prior Function Level of Independence: Independent with homemaking with ambulation;Independent with gait;Independent with transfers  Able to Take Stairs?: Yes Vocation: Retired  Vision/Perception  Not assessed.   Cognition Overall Cognitive Status: Impaired/Different from baseline Orientation Level: Oriented to person(partly oriented to location (pt states "[facility]" with therapist educating him on [facility])) Attention: Sustained Sustained Attention: Impaired Memory: Impaired Memory Impairment: Decreased recall of new information Awareness: Impaired Awareness Impairment: Intellectual impairment Problem Solving: Impaired Problem  Solving Impairment: Functional basic;Verbal basic Executive Function: (all impaired due to lower level deficits) Behaviors: Restless;Impulsive Safety/Judgment: Impaired  Sensation Sensation Light Touch: Not tested Proprioception: Impaired by gross assessment Coordination Gross Motor Movements are Fluid and Coordinated: No Fine Motor Movements are Fluid and Coordinated: No  Motor  Motor Motor: Abnormal postural alignment and control Motor - Skilled Clinical Observations: generalized deconditioning & BLE weakness   Mobility Transfers Transfers: Sit to Stand;Stand to Sit Sit to Stand: Minimal Assistance - Patient > 75% Stand to Sit: Moderate Assistance - Patient 50-74%  Locomotion  Gait Ambulation: Yes Gait Assistance: 2 Helpers(mod assist + w/c follow for safety & oxygen management) Gait Distance (Feet): 2 Feet Assistive device: Rolling walker Gait Gait: Yes Gait Pattern: (decreased step length BLE, decreased stride length, impaired coordination BLE) Stairs / Additional Locomotion Stairs: No Wheelchair Mobility Wheelchair Mobility: Yes Wheelchair Assistance: Chartered loss adjuster: Both upper extremities Wheelchair Parts Management: Needs assistance Distance: 20 ft    Trunk/Postural Assessment  Postural Control Postural Control: Deficits on evaluation Righting Reactions: impaired Protective Responses: impaired   Balance Balance Balance Assessed: Yes Static Sitting Balance Static Sitting - Balance Support: Bilateral upper extremity supported;Feet supported Static Sitting - Level of Assistance: 5: Stand by assistance Dynamic Standing Balance Dynamic Standing - Balance Support: Bilateral upper extremity supported;During functional activity Dynamic Standing - Level of Assistance: 3: Mod assist(during gait)  Extremity Assessment  RUE Assessment RUE Assessment: Within Functional Limits LUE Assessment LUE Assessment: Within Functional  Limits  RLE Assessment RLE Assessment: Exceptions to [facility] General Strength Comments: unable to perform LAQ sitting in chair LLE Assessment LLE Assessment: Exceptions to [facility] General Strength Comments: unable to partially complete LAQ while sitting in chair    Refer to Care Plan for Long Term Goals  Recommendations for other services: Therapeutic Recreation  Stress management  Discharge Criteria: Patient will be discharged from PT if patient refuses treatment 3 consecutive times without medical reason, if treatment goals not met, if there is a change in medical status, if patient makes no progress towards goals or if patient is discharged from hospital.  The above assessment, treatment plan, treatment alternatives and goals were discussed and mutually agreed upon: by patient  Waunita Schooner 05/04/2018, 12:31 PM

## 2018-05-05 ENCOUNTER — Inpatient Hospital Stay (HOSPITAL_COMMUNITY): Payer: Medicare Other

## 2018-05-05 LAB — GLUCOSE, CAPILLARY
Glucose-Capillary: 72 mg/dL (ref 70–99)
Glucose-Capillary: 175 mg/dL — ABNORMAL HIGH (ref 70–99)
Glucose-Capillary: 123 mg/dL — ABNORMAL HIGH (ref 70–99)
Glucose-Capillary: 185 mg/dL — ABNORMAL HIGH (ref 70–99)

## 2018-05-05 MED ORDER — STARCH (THICKENING) PO POWD
ORAL | Status: DC | PRN
  Filled 2018-05-05 (×2): qty 227

## 2018-05-05 MED ORDER — INSULIN DETEMIR 100 UNIT/ML ~~LOC~~ SOLN
10.0000 [IU] | Freq: Two times a day (BID) | SUBCUTANEOUS | Status: DC
  Administered 2018-05-05: 10 [IU] via SUBCUTANEOUS
  Filled 2018-05-05 (×2): qty 0.1

## 2018-05-05 MED ORDER — SODIUM CHLORIDE 0.45 % IV SOLN
INTRAVENOUS | Status: DC
  Administered 2018-05-05 – 2018-05-13 (×13): via INTRAVENOUS

## 2018-05-05 MED ORDER — FLUCONAZOLE 100 MG PO TABS
100.0000 mg | ORAL_TABLET | Freq: Every day | ORAL | Status: AC
  Administered 2018-05-05 – 2018-05-07 (×3): 100 mg via ORAL
  Filled 2018-05-05 (×3): qty 1

## 2018-05-05 MED ORDER — QUETIAPINE FUMARATE 25 MG PO TABS
25.0000 mg | ORAL_TABLET | Freq: Every day | ORAL | Status: DC
  Administered 2018-05-05: 25 mg via ORAL
  Filled 2018-05-05: qty 1

## 2018-05-05 NOTE — Progress Notes (Signed)
Physical Therapy Session Note  Patient Details  Name: Paul [name] MRN: [id number] Date of Birth: 1950/10/31  Today's Date: 05/05/2018 PT Individual Time: 6734-1937 PT Individual Time Calculation (min): 30 min   Short Term Goals: Week 1:  PT Short Term Goal 1 (Week 1): Pt will complete bed mobility with min assist.  PT Short Term Goal 2 (Week 1): Pt will ambulate 25 ft with LRAD & mod assist. PT Short Term Goal 3 (Week 1): Pt will propel w/c 50 ft without need for rest break for cardiopulmonary endurance training.  PT Short Term Goal 4 (Week 1): Pt will initiate stair training.   Skilled Therapeutic Interventions/Progress Updates:   Pt dozing in bed.  PT oriented pt; he knew day and year, but not season or day vs night.  Supine Therapeutic exercises performed with bil LEs to increase strength for functional mobility: 5 x 2 bil bridging, 10 x 1 lower trunk rotation, R/L active assistive straight leg raises.  Pt needed max cueing for remembering sequencing of exs. Pt exhibited bil extensor lag when performing straight leg raises.  Pt on 2L O2 via [place]; exercise HR 84; O2 sats 95%.  Rolling L with min assist; mod assist for sit><supine.  Sitting EOB, pt scooted forward with supervision.  From raises bed, sit> stand x 3; once in standing, wt shifting L><R with min assist, with cues for hip and knee extension.  Pt DOE, tolerating 10-15 wt shifts q bout before requesting to sit down.  HR 86, O2 sats 96% after this activity.  Pt left resting in bed with HOB elevated, and needs at hand and bed alarm set.     Therapy Documentation Precautions:  Precautions Precautions: Fall Precaution Comments: Supplemental 02 Restrictions Weight Bearing Restrictions: No  Pain: pt denies       Therapy/Group: Individual Therapy  Kaithlyn Teagle 05/05/2018, 5:12 PM

## 2018-05-05 NOTE — IPOC Note (Addendum)
Overall Plan of Care [facility]) Patient Details Name: Paul [name] MRN: [id number] DOB: [birth year]  Admitting Diagnosis: Encephaalopathy.  Hospital Problems: Active Problems:   Acute encephalopathy   Sleep disturbance   Hypoalbuminemia due to protein-calorie malnutrition (HCC)   Poorly controlled type 2 diabetes mellitus with peripheral neuropathy (HCC)   Dysphagia   Candidiasis     Functional Problem List: Nursing Behavior, Bladder, Bowel, Medication Management, Nutrition, Safety, Skin Integrity  PT Balance, Motor, Safety, Behavior, Nutrition, Sensory, Pain, Skin Integrity, Endurance, Perception  OT Balance, Behavior, Cognition, Vision, Endurance, Motor, Safety  SLP Cognition, Nutrition  TR         Basic ADL's: OT Grooming, Bathing, Dressing, Toileting     Advanced  ADL's: OT Simple Meal Preparation     Transfers: PT Bed Mobility, Bed to Chair, Car, Patent attorney, Agricultural engineer: PT Ambulation, Stairs, Wheelchair Mobility     Additional Impairments: OT None  SLP Swallowing, Social Cognition   Problem Solving, Memory, Attention, Awareness  TR      Anticipated Outcomes Item Anticipated Outcome  Self Feeding No goal  Swallowing  supervision    Basic self-care  Supervision/cuing   Toileting  Supervision/cuing    Bathroom Transfers Supervision/cuing   Bowel/Bladder  resume continence of bowel and bladder.  Transfers  supervision with LRAD  Locomotion  supervision with LRAD except CGA for stairs with B rails  Communication     Cognition  min assist   Pain  less than 3  Safety/Judgment  Remain free of falls, skin breakdown and infection   Therapy Plan: PT Intensity: Minimum of 1-2 x/day ,45 to 90 minutes PT Frequency: 5 out of 7 days PT Duration Estimated Length of Stay: 2.5-3 weeks OT Intensity: Minimum of 1-2 x/day, 45 to 90 minutes OT Frequency: 5 out of 7 days OT Duration/Estimated Length of Stay: 2.5-3 weeks SLP Intensity:  Minumum of 1-2 x/day, 30 to 90 minutes SLP Frequency: 3 to 5 out of 7 days SLP Duration/Estimated Length of Stay: 14-21 days     Team Interventions: Nursing Interventions Patient/Family Education, Bladder Management, Bowel Management, Disease Management/Prevention, Skin Care/Wound Management, Cognitive Remediation/Compensation, Dysphagia/Aspiration Precaution Training, Psychosocial Support, Discharge Planning  PT interventions Ambulation/gait training, Cognitive remediation/compensation, Discharge planning, DME/adaptive equipment instruction, Pain management, Functional mobility training, Psychosocial support, Splinting/orthotics, Therapeutic Activities, UE/LE Strength taining/ROM, Visual/perceptual remediation/compensation, Wheelchair propulsion/positioning, UE/LE Coordination activities, Therapeutic Exercise, Stair training, Skin care/wound management, Patient/family education, Neuromuscular re-education, Functional electrical stimulation, Community reintegration, Disease management/prevention, Training and development officer  OT Interventions Training and development officer, Disease mangement/prevention, Self Care/advanced ADL retraining, Therapeutic Exercise, Wheelchair propulsion/positioning, UE/LE Strength taining/ROM, DME/adaptive equipment instruction, Cognitive remediation/compensation, Pain management, Community reintegration, Barrister's clerk education, UE/LE Coordination activities, Therapeutic Activities, Psychosocial support, Functional mobility training, Discharge planning  SLP Interventions Cognitive remediation/compensation, Cueing hierarchy, Dysphagia/aspiration precaution training, Functional tasks, Patient/family education, Internal/external aids, Environmental controls  TR Interventions    SW/CM Interventions Discharge Planning, Psychosocial Support, Patient/Family Education   Barriers to Discharge MD  Medical stability, Behavior and Nutritional means  Nursing Decreased caregiver support     PT Inaccessible home environment, Decreased caregiver support, Home environment access/layout, Lack of/limited family support, Nutrition means, Behavior unsure if pt has B rails or no rails (pt & chart conflict), cognitive deficits  OT Decreased caregiver support, Medical stability, Lack of/limited family support, Behavior    SLP      SW       Team Discharge Planning: Destination: PT-Home ,OT- Home , SLP-Home Projected Follow-up: PT-Home health PT,  24 hour supervision/assistance, OT-  Home health OT, SLP-Home Health SLP, Outpatient SLP, Skilled Nursing facility, 24 hour supervision/assistance Projected Equipment Needs: PT-Wheelchair (measurements), Rolling walker with 5" wheels, OT- To be determined, SLP-None recommended by SLP Equipment Details: PT- , OT-  Patient/family involved in discharge planning: PT- Patient,  OT-Patient, Family member/caregiver, SLP-Patient, Family member/caregiver  MD ELOS: 18-22 days. Medical Rehab Prognosis:  Good Assessment: 68 year old right-handed male with history of CAD with angioplasty 2013 maintained on Plavix,diabetes mellitus, COPD, tobacco abuse and supplemental oxygen, hypertension. Presented 04/21/2018 with increasing shortness of breath and altered mental status. Chest x-ray indicated of right upper lobe pneumonia and profound hypoxemia. Patient did require intubation. Hospital course complicated by  Blood cultures strep Pneumo bacteremia.  Patient was extubated 04/27/2018. Completing a two-week course of ceftriaxone to end  on 05/05/2018.repeat blood cultures and respiratory cultures no growth to date. Noted bouts of agitation and restlessness requiring Precedex. Patient is NPO with Cortrak tube in place. Swallow study completed 05/03/2018 dysphagia #1 honey thick liquids.Patient  With resulting functional deficits with mobility, endurance, cognition, swallowing. Will set goals for Supervision with PT/OT and Min A with SLP.   See Team Conference Notes  for weekly updates to the plan of care

## 2018-05-05 NOTE — Progress Notes (Signed)
[place] PHYSICAL MEDICINE & REHABILITATION PROGRESS NOTE   Subjective/Complaints:  Patient did not sleep well.  Request Ambien for sleep again.  Would like NG tube to be out.  Did eat fairly well yesterday.  ROS: Patient denies fever, rash, sore throat, blurred vision, nausea, vomiting, diarrhea, cough, shortness of breath or chest pain, joint or back pain, headache, or mood change.   Objective:   Dg Swallowing Func-speech Pathology  Result Date: 05/03/2018 Objective Swallowing Evaluation: Type of Study: MBS-Modified Barium Swallow Study  Patient Details Name: Paul [name] MRN: [id number] Date of Birth: 01-Jan-1951 Today's Date: 05/03/2018 Time: SLP Start Time (ACUTE ONLY): 1330 -SLP Stop Time (ACUTE ONLY): 1346 SLP Time Calculation (min) (ACUTE ONLY): 16 min Past Medical History: Past Medical History: Diagnosis Date . CAD S/P percutaneous coronary angioplasty -- D1, Xience Xpedition DES 2.5 mm x 33 mm 11/24/2011  a) Mild-to-moderate 30-40% lesions in the RCA, LAD and Circumflex. b) CULPRIT LESION: long tubular 70-80% lesion in D1 with FFR of 0.7 --> PCI w/ Xience Xpedition DES 2.5 mm x 30 mm (2.65 MM); c) Lexiscan Myoview 11/2013: No Ischemia or Infarct (Inferior Gut Attenuation) EF 63%. Marland Kitchen COPD (chronic obstructive pulmonary disease) ([place])   "don't have full case of it; I'm right there at it" . Essential hypertension 11/24/2011 . Exertional dyspnea, chronic  . Gout  . History of Unstable angina 11/24/2011  Referred for cardiac catheterization . Hyperlipidemia with target LDL less than 70 11/24/2011 . Obesity (BMI 30.0-34.9)  . OSA (obstructive sleep apnea), uses oxygen at home did not tolerate cpap 11/24/2011 Past Surgical History: Past Surgical History: Procedure Laterality Date . CERVICAL SPINE SURGERY  2012 . CORONARY ANGIOPLASTY WITH STENT PLACEMENT  11/23/2011  "1; first one" . LEFT HEART CATHETERIZATION WITH CORONARY ANGIOGRAM N/A 11/23/2011  Procedure: LEFT HEART CATHETERIZATION WITH CORONARY ANGIOGRAM;   Surgeon: Leonie Man, MD;  Location: [facility] CATH LAB;  Service: Cardiovascular;  Laterality: N/A; HPI: 68 year old man with COPD on home oxygen admitted 1/5 with right upper lobe community-acquired pneumonia requiring mechanical ventilation. Intubated until 1/11.  No data recorded Assessment / Plan / Recommendation CHL IP CLINICAL IMPRESSIONS 05/03/2018 Clinical Impression Pt demonstrates severe oral and oropharyngeal dysphagia with dealyed swallow initaition and moderate to severe sensed aspiration events before the swallow with thin and nectar thick liquids.  Cough is not effective to clear.  There is also moderate generalized weakness with vallecular and pyriform residuals which may be more significant due to presennce of NG tube and decreased epiglottic inversion. Chin tuck cannot be fully completed due to present of cervical fusion, though slight forward head posture was better than posterior lean that pt seems to prefer. Pt can tolerate small sips of honey thick liquids and puree with cues for a second swallow. Expect improvement as oral mucosa before better hydrated for better oral control and sensation and when vocal quality and cough strength improved. Would not advise removal of NG tube until tolerance is established.  SLP Visit Diagnosis Dysphagia, oropharyngeal phase (R13.12) Attention and concentration deficit following -- Frontal lobe and executive function deficit following -- Impact on safety and function Moderate aspiration risk   CHL IP TREATMENT RECOMMENDATION 05/03/2018 Treatment Recommendations Defer treatment plan to f/u with SLP   No flowsheet data found. CHL IP DIET RECOMMENDATION 05/03/2018 SLP Diet Recommendations Dysphagia 1 (Puree) solids;Honey thick liquids Liquid Administration via Spoon;Cup Medication Administration Crushed with puree Compensations Slow rate;Small sips/bites;Multiple dry swallows after each bite/sip Postural Changes --   CHL IP OTHER  RECOMMENDATIONS 05/03/2018 Recommended  Consults -- Oral Care Recommendations Oral care QID Other Recommendations Order thickener from pharmacy;Have oral suction available   CHL IP FOLLOW UP RECOMMENDATIONS 05/03/2018 Follow up Recommendations Inpatient Rehab   No flowsheet data found.     CHL IP ORAL PHASE 05/03/2018 Oral Phase Impaired Oral - Pudding Teaspoon -- Oral - Pudding Cup -- Oral - Honey Teaspoon Decreased bolus cohesion;Lingual/palatal residue Oral - Honey Cup -- Oral - Nectar Teaspoon Decreased bolus cohesion;Lingual/palatal residue;Premature spillage Oral - Nectar Cup Decreased bolus cohesion;Lingual/palatal residue;Premature spillage Oral - Nectar Straw -- Oral - Thin Teaspoon Decreased bolus cohesion;Lingual/palatal residue;Premature spillage Oral - Thin Cup -- Oral - Thin Straw -- Oral - Puree Decreased bolus cohesion;Lingual/palatal residue Oral - Mech Soft -- Oral - Regular -- Oral - Multi-Consistency -- Oral - Pill -- Oral Phase - Comment --  CHL IP PHARYNGEAL PHASE 05/03/2018 Pharyngeal Phase Impaired Pharyngeal- Pudding Teaspoon -- Pharyngeal -- Pharyngeal- Pudding Cup -- Pharyngeal -- Pharyngeal- Honey Teaspoon Delayed swallow initiation-vallecula;Reduced epiglottic inversion;Reduced tongue base retraction;Pharyngeal residue - valleculae;Pharyngeal residue - pyriform Pharyngeal -- Pharyngeal- Honey Cup -- Pharyngeal -- Pharyngeal- Nectar Teaspoon Delayed swallow initiation-pyriform sinuses;Penetration/Aspiration before swallow;Significant aspiration (Amount);Pharyngeal residue - valleculae;Pharyngeal residue - pyriform;Reduced tongue base retraction;Reduced epiglottic inversion;Inter-arytenoid space residue Pharyngeal Material enters airway, passes BELOW cords and not ejected out despite cough attempt by patient Pharyngeal- Nectar Cup Delayed swallow initiation-pyriform sinuses;Penetration/Aspiration before swallow;Significant aspiration (Amount);Pharyngeal residue - valleculae;Pharyngeal residue - pyriform;Reduced tongue base  retraction;Reduced epiglottic inversion;Inter-arytenoid space residue Pharyngeal Material enters airway, passes BELOW cords and not ejected out despite cough attempt by patient Pharyngeal- Nectar Straw -- Pharyngeal -- Pharyngeal- Thin Teaspoon Delayed swallow initiation-pyriform sinuses;Penetration/Aspiration before swallow;Significant aspiration (Amount);Pharyngeal residue - valleculae;Pharyngeal residue - pyriform;Reduced tongue base retraction;Reduced epiglottic inversion;Inter-arytenoid space residue Pharyngeal Material enters airway, passes BELOW cords and not ejected out despite cough attempt by patient Pharyngeal- Thin Cup -- Pharyngeal -- Pharyngeal- Thin Straw -- Pharyngeal -- Pharyngeal- Puree Delayed swallow initiation-vallecula;Reduced epiglottic inversion;Reduced tongue base retraction;Pharyngeal residue - valleculae;Pharyngeal residue - pyriform Pharyngeal -- Pharyngeal- Mechanical Soft -- Pharyngeal -- Pharyngeal- Regular -- Pharyngeal -- Pharyngeal- Multi-consistency -- Pharyngeal -- Pharyngeal- Pill -- Pharyngeal -- Pharyngeal Comment --  No flowsheet data found. Herbie Baltimore, MA CCC-SLP Acute Rehabilitation Services Pager [phone] Office [phone] Lynann Beaver 05/03/2018, 5:42 PM              Recent Labs    05/03/18 0556  WBC 11.4*  HGB 12.3*  HCT 40.1  PLT 272   Recent Labs    05/03/18 0556  NA 146*  K 4.4  CL 111  CO2 26  GLUCOSE 224*  BUN 20  CREATININE 0.83  CALCIUM 8.5*    Intake/Output Summary (Last 24 hours) at 05/05/2018 0840 Last data filed at 05/05/2018 0030 Gross per 24 hour  Intake 480 ml  Output 100 ml  Net 380 ml     Physical Exam: Vital Signs Blood pressure 119/76, pulse 91, temperature 98.2 F (36.8 C), temperature source Oral, resp. rate 18, height 5\' 6"  (1.676 m), weight 77.6 kg, SpO2 91 %. Constitutional: No distress . Vital signs reviewed. HEENT: EOMI, oral membranes moist, NGT Neck: supple Cardiovascular: RRR without  murmur. No JVD    Respiratory: CTA Bilaterally without wheezes or rales. Normal effort    GI: BS +, non-tender, non-distended  Skin: macular rash in groin Neurological:   Alert, improved phonation.  Fair insight and awareness..   Moves all fours but inconsistent. Senses pain in all 4's.  Psych: A little restless but cooperative     Assessment/Plan: 1. Functional deficits secondary to hypoxic encephalopathy and debility which require 3+ hours per day of interdisciplinary therapy in a comprehensive inpatient rehab setting.  Physiatrist is providing close team supervision and 24 hour management of active medical problems listed below.  Physiatrist and rehab team continue to assess barriers to discharge/monitor patient progress toward functional and medical goals  Care Tool:  Bathing    Body parts bathed by patient: Right arm, Left arm, Chest, Abdomen, Front perineal area, Right upper leg, Left upper leg, Right lower leg, Left lower leg, Face   Body parts bathed by helper: Buttocks     Bathing assist Assist Level: Moderate Assistance - Patient 50 - 74%     Upper Body Dressing/Undressing Upper body dressing   What is the patient wearing?: Pull over shirt    Upper body assist Assist Level: Moderate Assistance - Patient 50 - 74%    Lower Body Dressing/Undressing Lower body dressing      What is the patient wearing?: Pants     Lower body assist Assist for lower body dressing: Moderate Assistance - Patient 50 - 74%     Toileting Toileting Toileting Activity did not occur Landscape architect and hygiene only): (incontinent care)  Toileting assist Assist for toileting: Total Assistance - Patient < 25%     Transfers Chair/bed transfer  Transfers assist  Chair/bed transfer activity did not occur: Safety/medical concerns  Chair/bed transfer assist level: Maximal Assistance - Patient 25 - 49%     Locomotion Ambulation   Ambulation assist   Ambulation activity  did not occur: Safety/medical concerns          Walk 10 feet activity   Assist  Walk 10 feet activity did not occur: Safety/medical concerns        Walk 50 feet activity   Assist Walk 50 feet with 2 turns activity did not occur: Safety/medical concerns         Walk 150 feet activity   Assist Walk 150 feet activity did not occur: Safety/medical concerns         Walk 10 feet on uneven surface  activity   Assist Walk 10 feet on uneven surfaces activity did not occur: Safety/medical concerns         Wheelchair     Assist Will patient use wheelchair at discharge?: Yes Type of Wheelchair: Manual    Wheelchair assist level: Supervision/Verbal cueing Max wheelchair distance: 20 ft     Wheelchair 50 feet with 2 turns activity    Assist    Wheelchair 50 feet with 2 turns activity did not occur: Safety/medical concerns       Wheelchair 150 feet activity     Assist Wheelchair 150 feet activity did not occur: Safety/medical concerns         Medical Problem List and Plan: 1.  Encephalopathy/debility secondary to  Acute hypoxic respiratory failure/RUL pneumonia complicated by history of COPD oxygen dependent. Continue nebulizers as directed         -Continue CIR therapies including PT, OT, and SLP  2.  DVT Prophylaxis/Anticoagulation: Lovenox. Monitor for any bleeding episodes 3. Pain Management:  Tylenol as needed 4. Mood/delirium:  Provide emotional support  -We will begin trial Seroquel 25 mg nightly  -Keep sleep chart 5. Neuropsych: This patient is capable of making decisions on his own behalf. 6. Skin/Wound Care:   Add Diflucan 100 mg p.o. twice daily x3 days for inguinal rash  7. Fluids/Electrolytes/Nutrition:  Routine in and out's with follow-up chemistries 8. Dysphagia.Cortrak tube: Swallow study 05/03/2018 dysphagia #1 honey liquids.  -Have initiated diet with supervision  -Remove NG tube and begin IV fluids at night to help sustain  hydration until honey liquids are advanced 9.ID/Strep Pneumo Bacteremia. Continue ceftriaxone through 05/05/2018 and stop  -Afebrile at present 10. CAD with stenting. Plavix. 11. Hypertension. Clonidine 0.1 mg daily, Toprol 50 mg daily  -Blood pressure controlled 1/19 12. Diabetes mellitus with peripheral neuropathy. Levemir BID. Checking blood sugars before meals and at bedtime. Patient on Glucophage 500 mg daily prior to admission             -Reduce Levemir to 10 units twice daily as we convert to orals alone 13. History of gout. Monitor for any flareups 14. Constipation. Laxative assistance.   LOS: 2 days A FACE TO FACE EVALUATION WAS PERFORMED  Meredith Staggers 05/05/2018, 8:40 AM

## 2018-05-06 ENCOUNTER — Inpatient Hospital Stay (HOSPITAL_COMMUNITY): Payer: Medicare Other | Admitting: Occupational Therapy

## 2018-05-06 ENCOUNTER — Inpatient Hospital Stay (HOSPITAL_COMMUNITY): Payer: Medicare Other

## 2018-05-06 DIAGNOSIS — R1310 Dysphagia, unspecified: Secondary | ICD-10-CM

## 2018-05-06 DIAGNOSIS — I10 Essential (primary) hypertension: Secondary | ICD-10-CM

## 2018-05-06 DIAGNOSIS — E1142 Type 2 diabetes mellitus with diabetic polyneuropathy: Secondary | ICD-10-CM

## 2018-05-06 DIAGNOSIS — E1165 Type 2 diabetes mellitus with hyperglycemia: Secondary | ICD-10-CM

## 2018-05-06 DIAGNOSIS — B379 Candidiasis, unspecified: Secondary | ICD-10-CM

## 2018-05-06 DIAGNOSIS — N183 Chronic kidney disease, stage 3 unspecified: Secondary | ICD-10-CM

## 2018-05-06 DIAGNOSIS — E118 Type 2 diabetes mellitus with unspecified complications: Secondary | ICD-10-CM

## 2018-05-06 DIAGNOSIS — D62 Acute posthemorrhagic anemia: Secondary | ICD-10-CM

## 2018-05-06 DIAGNOSIS — E8809 Other disorders of plasma-protein metabolism, not elsewhere classified: Secondary | ICD-10-CM

## 2018-05-06 DIAGNOSIS — E46 Unspecified protein-calorie malnutrition: Secondary | ICD-10-CM

## 2018-05-06 DIAGNOSIS — G479 Sleep disorder, unspecified: Secondary | ICD-10-CM

## 2018-05-06 LAB — COMPREHENSIVE METABOLIC PANEL
ALT: 33 U/L (ref 0–44)
AST: 49 U/L — ABNORMAL HIGH (ref 15–41)
Albumin: 2.2 g/dL — ABNORMAL LOW (ref 3.5–5.0)
Alkaline Phosphatase: 73 U/L (ref 38–126)
Anion gap: 9 (ref 5–15)
BUN: 15 mg/dL (ref 8–23)
CO2: 25 mmol/L (ref 22–32)
Calcium: 8.2 mg/dL — ABNORMAL LOW (ref 8.9–10.3)
Chloride: 106 mmol/L (ref 98–111)
Creatinine, Ser: 0.74 mg/dL (ref 0.61–1.24)
GFR calc Af Amer: >60 mL/min (ref >60)
GFR calc non Af Amer: >60 mL/min (ref >60)
Glucose, Bld: 107 mg/dL — ABNORMAL HIGH (ref 70–99)
Potassium: 4 mmol/L (ref 3.5–5.1)
Sodium: 140 mmol/L (ref 135–145)
Total Bilirubin: 0.5 mg/dL (ref 0.3–1.2)
Total Protein: 6.2 g/dL — ABNORMAL LOW (ref 6.5–8.1)

## 2018-05-06 LAB — CBC WITH DIFFERENTIAL/PLATELET
Abs Immature Granulocytes: 0.07 10*3/uL (ref 0.00–0.07)
Basophils Absolute: 0 10*3/uL (ref 0.0–0.1)
Basophils Relative: 0 %
Eosinophils Absolute: 0.2 10*3/uL (ref 0.0–0.5)
Eosinophils Relative: 2 %
HCT: 36.1 % — ABNORMAL LOW (ref 39.0–52.0)
Hemoglobin: 11.4 g/dL — ABNORMAL LOW (ref 13.0–17.0)
Immature Granulocytes: 1 %
Lymphocytes Relative: 32 %
Lymphs Abs: 2.3 10*3/uL (ref 0.7–4.0)
MCH: 30.5 pg (ref 26.0–34.0)
MCHC: 31.6 g/dL (ref 30.0–36.0)
MCV: 96.5 fL (ref 80.0–100.0)
Monocytes Absolute: 0.4 10*3/uL (ref 0.1–1.0)
Monocytes Relative: 6 %
Neutro Abs: 4.4 10*3/uL (ref 1.7–7.7)
Neutrophils Relative %: 59 %
Platelets: 226 10*3/uL (ref 150–400)
RBC: 3.74 MIL/uL — ABNORMAL LOW (ref 4.22–5.81)
RDW: 14.7 % (ref 11.5–15.5)
WBC: 7.3 10*3/uL (ref 4.0–10.5)
nRBC: 0 % (ref 0.0–0.2)

## 2018-05-06 LAB — GLUCOSE, CAPILLARY
Glucose-Capillary: 97 mg/dL (ref 70–99)
Glucose-Capillary: 194 mg/dL — ABNORMAL HIGH (ref 70–99)
Glucose-Capillary: 118 mg/dL — ABNORMAL HIGH (ref 70–99)
Glucose-Capillary: 175 mg/dL — ABNORMAL HIGH (ref 70–99)

## 2018-05-06 MED ORDER — MELATONIN 3 MG PO TABS
3.0000 mg | ORAL_TABLET | Freq: Every day | ORAL | Status: DC
  Administered 2018-05-06 – 2018-05-21 (×16): 3 mg via ORAL
  Filled 2018-05-06 (×16): qty 1

## 2018-05-06 MED ORDER — COLCHICINE 0.6 MG PO TABS
0.6000 mg | ORAL_TABLET | Freq: Every day | ORAL | Status: DC
  Administered 2018-05-07 – 2018-05-22 (×16): 0.6 mg via ORAL
  Filled 2018-05-06 (×16): qty 1

## 2018-05-06 MED ORDER — NON FORMULARY: 1.5000 mg | Freq: Every day | Status: DC

## 2018-05-06 NOTE — Progress Notes (Signed)
Social Work  Social Work Assessment and Plan  Patient Details  Name: Paul [name] MRN: [id number] Date of Birth: Apr 15, 1951  Today's Date: 05/06/2018  Problem List:  Patient Active Problem List   Diagnosis Date Noted  . Sleep disturbance   . Hypoalbuminemia due to protein-calorie malnutrition ([name])   . Poorly controlled type 2 diabetes mellitus with peripheral neuropathy ([name])   . Dysphagia   . Candidiasis   . Acute encephalopathy 05/03/2018  . Pressure injury of skin 04/30/2018  . Coronary artery disease involving native coronary artery of native heart without angina pectoris   . Tobacco abuse   . Hypertension   . Acute blood loss anemia   . Bacteremia   . Respiratory failure ([name])   . CAP (community acquired pneumonia) 04/21/2018  . Corneal irritation of both eyes 10/31/2017  . Allergic rhinitis due to animal hair and dander 10/31/2017  . OSA on CPAP 10/10/2016  . OSA and COPD overlap syndrome ([name]) 03/02/2016  . Bronchitis, chronic obstructive, with exacerbation ([name]) 03/02/2016  . Hypoxemia 03/02/2016  . Dependence on supplemental oxygen 03/02/2016  . CAD, multiple vessel 03/02/2016  . Gastroesophageal reflux disease 03/02/2016  . HTN (hypertension), malignant 03/02/2016  . Cigarette smoker 02/22/2014  . DOE (dyspnea on exertion) 01/20/2014  . Obesity (BMI 30.0-34.9)   . Essential hypertension 11/24/2011  . Hyperlipidemia with target LDL less than 70 11/24/2011  . OSA (obstructive sleep apnea), uses oxygen at home did not tolerate cpap 11/24/2011  . COPD GOLD I  11/24/2011  . CAD S/P percutaneous coronary angioplasty -- D1, Xience Xpedition DES 2.5 mm x 33 mm 11/24/2011   Past Medical History:  Past Medical History:  Diagnosis Date  . CAD S/P percutaneous coronary angioplasty -- D1, Xience Xpedition DES 2.5 mm x 33 mm 11/24/2011   a) Mild-to-moderate 30-40% lesions in the RCA, LAD and Circumflex. b) CULPRIT LESION: long tubular 70-80% lesion in D1 with FFR of 0.7 -->  PCI w/ Xience Xpedition DES 2.5 mm x 30 mm (2.65 MM); c) Lexiscan Myoview 11/2013: No Ischemia or Infarct (Inferior Gut Attenuation) EF 63%.  Marland Kitchen COPD (chronic obstructive pulmonary disease) ([name])    "don't have full case of it; I'm right there at it"  . Essential hypertension 11/24/2011  . Exertional dyspnea, chronic   . Gout   . History of Unstable angina 11/24/2011   Referred for cardiac catheterization  . Hyperlipidemia with target LDL less than 70 11/24/2011  . Obesity (BMI 30.0-34.9)   . OSA (obstructive sleep apnea), uses oxygen at home did not tolerate cpap 11/24/2011   Past Surgical History:  Past Surgical History:  Procedure Laterality Date  . CERVICAL SPINE SURGERY  2012  . CORONARY ANGIOPLASTY WITH STENT PLACEMENT  11/23/2011   "1; first one"  . LEFT HEART CATHETERIZATION WITH CORONARY ANGIOGRAM N/A 11/23/2011   Procedure: LEFT HEART CATHETERIZATION WITH CORONARY ANGIOGRAM;  Surgeon: Leonie Man, MD;  Location: [facility] CATH LAB;  Service: Cardiovascular;  Laterality: N/A;   Social History:  reports that he has been smoking cigarettes. He has a 40.00 pack-year smoking history. He has never used smokeless tobacco. He reports current alcohol use. He reports that he does not use drugs.  Family / Support Systems Marital Status: Divorced How Long?: 40 yrs Patient Roles: Parent, Other (Comment)(sibling) Children: daughter, Vennie Homans (local) @ (C) [phone];  pt has a son, Erlene Quan, living in [place] Other Supports: brother, Lehi Phifer (local) @ (C) [phone];  two sister's living locally:  Vaughan Basta  and Debbie. Anticipated Caregiver: Daughter is primary contact and notes that she and her spouse have "kind of flexible jobs" and can regularly check on pt.  Also, considering private caregiver. Ability/Limitations of Caregiver: While their jobs are "flexible", daughter and other family members do still work and are limited in their time. Caregiver Availability: Intermittent(but daughter is working on  private duty person to supplement) Family Dynamics: Daughter very involved and supportive.   She notes she is the primary contact but is working with her brother and other family members who live locally to coordinate 24/7 care.  Daughter in room during assessment interview and very encouraging.  Social History Preferred language: English Religion: Baptist Cultural Background: NA Read: Yes Write: Yes Employment Status: Retired Public relations account executive Issues: None Guardian/Conservator: None - per MD, pt is not capable of making decisions on his own behalf - defer to daughter.   Abuse/Neglect Abuse/Neglect Assessment Can Be Completed: Yes Physical Abuse: Denies Verbal Abuse: Denies Sexual Abuse: Denies Exploitation of patient/patient's resources: Denies Self-Neglect: Denies  Emotional Status Pt's affect, behavior and adjustment status: Pt lying in bed and attempts to complete assessment on his own, however, some answers are a little confused and daughter assists with clarifying information.  Pt states, "I've got a long way to go (recovery)."  He denies any significant emotional distress and does not appear to be disturbed by the situation overall.  Will monitor and refer for neuropsychology as indicated. Recent Psychosocial Issues: None Psychiatric History: None Substance Abuse History: None  Patient / Family Perceptions, Expectations & Goals Pt/Family understanding of illness & functional limitations: Pt and daughter with good, basic understanding that pt was admitted with bacterial pneumonia and was "very sick".  Both with basic understanding that pt's cognition has been affected as well.   Premorbid pt/family roles/activities: Pt was completely independent, living alone and managing his home. Anticipated changes in roles/activities/participation: Per goals from team of supervision, daughter to assume primary caregiver support role.   Pt/family expectations/goals: "I just need to  focus on working hard."  [employer]: None Premorbid Home Care/DME Agencies: None Transportation available at discharge: yes - daughter and other family  Discharge Planning Living Arrangements: [place]: Children, Other relatives, Friends/neighbors Type of Residence: Private residence Insurance Resources: [employer], Multimedia programmer (specify)(generic commercial) Museum/gallery curator Resources: [place] Referred: No Living Expenses: Own Money Management: Patient Does the patient have any problems obtaining your medications?: No Home Management: Pt Patient/Family Preliminary Plans: Pt plans to return to his home.  Daughter trying to coordinate 24/7 support. Expected length of stay: 14-21 days  Clinical Impression Frail appearing gentleman who is on CIR for debility following pneumonia as well as encephalopathy.  He is motivated for CIR but is distracted by pain "in my butt".  Daughter at bedside and very supportive.  Notes she is aware that 24/7 supervision is goal and she is working on a plan for coverage.  Will follow for support and d/c planning needs.  Hersel Mcmeen 05/06/2018, 1:06 PM

## 2018-05-06 NOTE — Progress Notes (Signed)
Occupational Therapy Session Note  Patient Details  Name: Paul [name] MRN: [id number] Date of Birth: 1950/07/28  Today's Date: 05/06/2018 OT Individual Time: 1552-0802 OT Individual Time Calculation (min): 34 min    Short Term Goals: Week 1:  OT Short Term Goal 1 (Week 1): Pt will complete sit<stand from toilet with Mod A  OT Short Term Goal 2 (Week 1): Pt will complete UB dressing with Min A  OT Short Term Goal 3 (Week 1): Pt will complete 1 grooming task in standing while maintaining stable vitals to increase standing endurance   Skilled Therapeutic Interventions/Progress Updates:    Pt worked on stand pivot transfers from wheelchair to the tub bench during session with overall min assist.  He needed min facilitation for lifting LEs into and out of the tub, with pt using his own UEs to help lift them over.  He then worked on doffing pants in the room in standing with mod assist secondary to them getting wet earlier during toileting with nursing.  He was able to wash his privates with mod assist for standing balance secondary to posterior LOB.  Therapist assisted with donning brief and gripper socks to conclude session with min assist transfer back to the bed.  [name] continues to need mod instructional cueing for hand placement during sit to stand.  Pt left in bed with call button and phone in reach and safety belt in place.    Therapy Documentation Precautions:  Precautions Precautions: Fall Precaution Comments: Supplemental 02 Restrictions Weight Bearing Restrictions: No  Pain: Pain Assessment Pain Scale: Faces Pain Score: 0-No pain Faces Pain Scale: Hurts a little bit Pain Type: Acute pain Pain Location: Generalized Pain Descriptors / Indicators: Discomfort Pain Onset: Unable to tell Pain Intervention(s): Repositioned;Emotional support  Therapy/Group: Individual Therapy  Marquet Faircloth OTR/L 05/06/2018, 4:03 PM

## 2018-05-06 NOTE — Progress Notes (Signed)
[facility] PHYSICAL MEDICINE & REHABILITATION PROGRESS NOTE   Subjective/Complaints: Patient seen laying in bed this morning.  He did not sleep well overnight per sleep chart.  He is confused this morning.  ROS: Limited due to cognition.   Objective:   No results found. Recent Labs    05/06/18 0606  WBC 7.3  HGB 11.4*  HCT 36.1*  PLT 226   Recent Labs    05/06/18 0606  NA 140  K 4.0  CL 106  CO2 25  GLUCOSE 107*  BUN 15  CREATININE 0.74  CALCIUM 8.2*    Intake/Output Summary (Last 24 hours) at 05/06/2018 0839 Last data filed at 05/06/2018 0324 Gross per 24 hour  Intake 462 ml  Output 225 ml  Net 237 ml     Physical Exam: Vital Signs Blood pressure 136/71, pulse 75, temperature 97.9 F (36.6 C), temperature source Oral, resp. rate 19, height 5\' 6"  (1.676 m), weight 77.6 kg, SpO2 94 %. Constitutional: No distress . Vital signs reviewed. HENT: Normocephalic.  Atraumatic.  + NG. Eyes: EOMI. No discharge. Cardiovascular: RRR. No JVD. Respiratory: Wheezes. Normal effort.  + Drake. GI: BS +.  Distended. Musc: No edema or tenderness in extremities. Skin: Intact.  Warm and dry. Neurological:  Alert and oriented to person and place only.   Fair insight and awareness.Marland Kitchen    Psych: Tangential.  Assessment/Plan: 1. Functional deficits secondary to hypoxic encephalopathy and debility which require 3+ hours per day of interdisciplinary therapy in a comprehensive inpatient rehab setting.  Physiatrist is providing close team supervision and 24 hour management of active medical problems listed below.  Physiatrist and rehab team continue to assess barriers to discharge/monitor patient progress toward functional and medical goals  Care Tool:  Bathing    Body parts bathed by patient: Right arm, Left arm, Chest, Abdomen, Front perineal area, Right upper leg, Left upper leg, Right lower leg, Left lower leg, Face   Body parts bathed by helper: Buttocks     Bathing assist  Assist Level: Moderate Assistance - Patient 50 - 74%     Upper Body Dressing/Undressing Upper body dressing   What is the patient wearing?: Pull over shirt    Upper body assist Assist Level: Moderate Assistance - Patient 50 - 74%    Lower Body Dressing/Undressing Lower body dressing      What is the patient wearing?: Pants     Lower body assist Assist for lower body dressing: Moderate Assistance - Patient 50 - 74%     Toileting Toileting Toileting Activity did not occur Landscape architect and hygiene only): (incontinent care)  Toileting assist Assist for toileting: Total Assistance - Patient < 25%     Transfers Chair/bed transfer  Transfers assist  Chair/bed transfer activity did not occur: Safety/medical concerns  Chair/bed transfer assist level: Maximal Assistance - Patient 25 - 49%     Locomotion Ambulation   Ambulation assist   Ambulation activity did not occur: Safety/medical concerns          Walk 10 feet activity   Assist  Walk 10 feet activity did not occur: Safety/medical concerns        Walk 50 feet activity   Assist Walk 50 feet with 2 turns activity did not occur: Safety/medical concerns         Walk 150 feet activity   Assist Walk 150 feet activity did not occur: Safety/medical concerns         Walk 10 feet on uneven surface  activity   Assist Walk 10 feet on uneven surfaces activity did not occur: Safety/medical concerns         Wheelchair     Assist Will patient use wheelchair at discharge?: Yes Type of Wheelchair: Manual    Wheelchair assist level: Supervision/Verbal cueing Max wheelchair distance: 20 ft     Wheelchair 50 feet with 2 turns activity    Assist    Wheelchair 50 feet with 2 turns activity did not occur: Safety/medical concerns       Wheelchair 150 feet activity     Assist Wheelchair 150 feet activity did not occur: Safety/medical concerns         Medical Problem List  and Plan: 1.  Encephalopathy/debility secondary to  Acute hypoxic respiratory failure/RUL pneumonia complicated by history of COPD oxygen dependent. Continue nebulizers as directed  Continue CIR  Notes reviewed- respiratory failure complicated by encephalopathy.  Discussed with weekend physician.  Labs reviewed. 2.  DVT Prophylaxis/Anticoagulation: Lovenox. Monitor for any bleeding episodes 3. Pain Management:  Tylenol as needed 4. Mood/delirium:  Provide emotional support 5. Neuropsych: This patient is  not capable of making decisions on his own behalf. 6. Skin/Wound Care:   Added Diflucan 100 mg p.o. twice daily x3 (through 1/21) days for inguinal rash 7. Fluids/Electrolytes/Nutrition:  Routine in and out's with follow-up chemistries 8. Dysphagia.Cortrak tube: Swallow study 05/03/2018 dysphagia #1 honey liquids.  DC NG  IVF nightly 9.ID/Strep Pneumo Bacteremia.  Completed ceftriaxone on 05/05/2018  10. CAD with stenting. Plavix. 11. Hypertension. Clonidine 0.1 mg daily, Toprol 50 mg daily  Controlled on 1/20 12. Diabetes mellitus with peripheral neuropathy. Levemir BID. Checking blood sugars before meals and at bedtime. Patient on Glucophage 500 mg daily prior to admission             Reduced Levemir to 10 units twice daily as we convert to orals alone, DC'd on 1/20  Plan to resume metformin tomorrow 13. History of gout. Monitor for any flareups 14. Constipation. Laxative assistance.  15.  Acute blood loss anemia  Hemoglobin 11.4 on 1/20  Continue to monitor 16.  Hypoalbuminemia  Supplement initiated 17.  Sleep disturbance  Seroquel DC'd  Melatonin started on 1/20   LOS: 3 days A FACE TO FACE EVALUATION WAS PERFORMED  Laken Rog Lorie Phenix 05/06/2018, 8:39 AM

## 2018-05-06 NOTE — Progress Notes (Signed)
Occupational Therapy Session Note  Patient Details  Name: Paul [name] MRN: [id number] Date of Birth: 08-26-1950  Today's Date: 05/06/2018 OT Individual Time: [phone] OT Individual Time Calculation (min): 78 min    Short Term Goals: Week 1:  OT Short Term Goal 1 (Week 1): Pt will complete sit<stand from toilet with Mod A  OT Short Term Goal 2 (Week 1): Pt will complete UB dressing with Min A  OT Short Term Goal 3 (Week 1): Pt will complete 1 grooming task in standing while maintaining stable vitals to increase standing endurance   Skilled Therapeutic Interventions/Progress Updates:    Pt completed transfer from supine to sit EOB with min assist and min instructional cueing for technique.  He was able to perform transfer stand pivot to the wheelchair from the EOB with mod assist in order to wash up and dress at the sink.  He completed UB bathing and dressing in sitting with supervision.  He completed LB bathing with mod assist as well as LB dressing with max assist.  He was able to donn his socks with setup crossing each LE over the opposite knee, but needed max assist for donning and tying his shoes.  Finished session with work on self feeding while discussing home setup and PLOF.  Pt left in wheelchair with safety alarm belt in place and call button and phone in reach.   Therapy Documentation Precautions:  Precautions Precautions: Fall Precaution Comments: Supplemental 02 Restrictions Weight Bearing Restrictions: No   Vital Signs: Therapy Vitals Patient Position (if appropriate): Sitting Oxygen Therapy SpO2: 94 % O2 Device: Nasal Cannula O2 Flow Rate (L/min): 93 L/min Patient Activity (if Appropriate): In chair Pulse Oximetry Type: Intermittent Pain: Pain Assessment Pain Score: 0-No pain ADL: See Care Tool Section for some details of ADL  Therapy/Group: Individual Therapy  Maykel Reitter OTR/L 05/06/2018, 9:18 AM

## 2018-05-06 NOTE — Progress Notes (Signed)
Patient is currently in therapy RN to replace consult when patient returns

## 2018-05-06 NOTE — H&P (Signed)
Physical Medicine and Rehabilitation Admission H&P        Chief Complaint  Patient presents with  . Respiratory Distress  : HPI: Paul [name] is a 68 year old right-handed male with history of CAD with angioplasty 2013 maintained on Plavix,diabetes mellitus, COPD, tobacco abuse and supplemental oxygen, hypertension. Per chart review, patient lives alone reportedly independent prior to admission. He does have a daughter in the area. Presented 04/21/2018 with increasing shortness of breath and altered mental status. Chest x-ray indicated of right upper lobe pneumonia and profound hypoxemia. Patient did require intubation. Hospital course complicated by  Blood cultures strep Pneumo bacteremia.  Patient was extubated 04/27/2018. Completing a two-week course of ceftriaxone to end  on 05/05/2018.repeat blood cultures and respiratory cultures no growth to date. Noted bouts of agitation and restlessness requiring Precedex. Subcutaneous Lovenox for DVT prophylaxis. Patient is NPO with Cortrak tube in place. Swallow study completed 05/03/2018 dysphagia #1 honey thick liquids.Therapy evaluations completed with recommendations of physical medicine rehabilitation consult. Patient was admitted for a comprehensive rehabilitation program.   Review of Systems  Constitutional: Negative for chills and fever.  HENT: Negative for hearing loss.   Eyes: Negative for blurred vision and double vision.  Respiratory: Positive for cough and shortness of breath.   Cardiovascular: Positive for leg swelling. Negative for chest pain.  Gastrointestinal: Positive for constipation. Negative for nausea and vomiting.  Genitourinary: Negative for dysuria, flank pain and hematuria.  Musculoskeletal: Positive for myalgias.  Skin: Negative for rash.  All other systems reviewed and are negative.       Past Medical History:  Diagnosis Date  . CAD S/P percutaneous coronary angioplasty -- D1, Xience Xpedition DES 2.5 mm x 33 mm  11/24/2011    a) Mild-to-moderate 30-40% lesions in the RCA, LAD and Circumflex. b) CULPRIT LESION: long tubular 70-80% lesion in D1 with FFR of 0.7 --> PCI w/ Xience Xpedition DES 2.5 mm x 30 mm (2.65 MM); c) Lexiscan Myoview 11/2013: No Ischemia or Infarct (Inferior Gut Attenuation) EF 63%.  Marland Kitchen COPD (chronic obstructive pulmonary disease) ([place])      "don't have full case of it; I'm right there at it"  . Essential hypertension 11/24/2011  . Exertional dyspnea, chronic    . Gout    . History of Unstable angina 11/24/2011    Referred for cardiac catheterization  . Hyperlipidemia with target LDL less than 70 11/24/2011  . Obesity (BMI 30.0-34.9)    . OSA (obstructive sleep apnea), uses oxygen at home did not tolerate cpap 11/24/2011         Past Surgical History:  Procedure Laterality Date  . CERVICAL SPINE SURGERY   2012  . CORONARY ANGIOPLASTY WITH STENT PLACEMENT   11/23/2011    "1; first one"  . LEFT HEART CATHETERIZATION WITH CORONARY ANGIOGRAM N/A 11/23/2011    Procedure: LEFT HEART CATHETERIZATION WITH CORONARY ANGIOGRAM;  Surgeon: Leonie Man, MD;  Location: [facility] CATH LAB;  Service: Cardiovascular;  Laterality: N/A;         Family History  Problem Relation Age of Onset  . Heart disease Sister    . Heart attack Brother    . Emphysema Father          smoked  . Aneurysm Father      Social History:  reports that he has been smoking cigarettes. He has a 40.00 pack-year smoking history. He has never used smokeless tobacco. He reports current alcohol use. He reports that he does not  use drugs. Allergies:       Allergies  Allergen Reactions  . Codeine Nausea And Vomiting  . Duloxetine Hcl Other (See Comments)      Took at night and still felt very lethargic the following day.  (malaise)          Medications Prior to Admission  Medication Sig Dispense Refill  . amLODipine (NORVASC) 5 MG tablet Take 5 mg by mouth daily.      . colchicine (COLCRYS) 0.6 MG tablet Take two tabs by mouth at  once then a third tablet one hour later. (Patient taking differently: Take 0.6 mg by mouth daily. Take two tabs by mouth at once then a third tablet one hour later.) 3 tablet 0  . DULoxetine (CYMBALTA) 30 MG capsule Take 30 mg by mouth daily.   3  . esomeprazole (NEXIUM) 20 MG capsule Take 20 mg by mouth daily at 12 noon.      . Fluticasone-Salmeterol (ADVAIR DISKUS) 250-50 MCG/DOSE AEPB Inhale 1 puff into the lungs 2 (two) times daily.      Marland Kitchen gabapentin (NEURONTIN) 300 MG capsule Take 300 mg by mouth at bedtime.   1  . ibuprofen (ADVIL,MOTRIN) 200 MG tablet Take 200 mg by mouth every 6 (six) hours as needed for mild pain.      . metFORMIN (GLUCOPHAGE-XR) 500 MG 24 hr tablet Take 500 mg by mouth daily with supper.   3  . metoprolol succinate (TOPROL-XL) 25 MG 24 hr tablet Take 50 mg by mouth daily.   12  . tiotropium (SPIRIVA HANDIHALER) 18 MCG inhalation capsule Place 18 mcg into inhaler and inhale daily.      Marland Kitchen zolpidem (AMBIEN) 10 MG tablet Take 10 mg by mouth at bedtime as needed for sleep.       . nitroGLYCERIN (NITROSTAT) 0.4 MG SL tablet Place 1 tablet (0.4 mg total) under the tongue every 5 (five) minutes as needed for chest pain. (Patient not taking: Reported on 04/22/2018) 25 tablet 4      Drug Regimen Review Drug regimen was reviewed and remains appropriate with no significant issues identified   Home: Home Living Family/patient expects to be discharged to:: Inpatient rehab   Functional History: Prior Function Level of Independence: Independent Comments: lives at home alone rides motorcylces, per family not the best diet or activity level and has had some leg weakness as of late. indepenent with ADLs   Functional Status:  Mobility: Bed Mobility Overal bed mobility: Needs Assistance Bed Mobility: Supine to Sit Supine to sit: Mod assist Sit to supine: Mod assist, +2 for physical assistance General bed mobility comments: assist with legs over EOB, raise trunk from sitting. able  to balance sitting EOB with stand by assist  Transfers Overall transfer level: Needs assistance Equipment used: 2 person hand held assist Transfers: Sit to/from Stand Sit to Stand: Max assist, +2 physical assistance General transfer comment: max A to stand, able to side step and pivot into chair, max A of two for stability, poor sequencing  Ambulation/Gait General Gait Details: not able yet   ADL: ADL Overall ADL's : Needs assistance/impaired Eating/Feeding: Maximal assistance, Sitting Eating/Feeding Details (indicate cue type and reason): ice chips Grooming: Maximal assistance, Sitting, Wash/dry face Grooming Details (indicate cue type and reason): supported sitting Upper Body Bathing: Maximal assistance Upper Body Bathing Details (indicate cue type and reason): supported sitting Lower Body Bathing: Total assistance Lower Body Bathing Details (indicate cue type and reason): Mod A+2 partial sit>stand Upper  Body Dressing : Total assistance Upper Body Dressing Details (indicate cue type and reason): supported sitting Lower Body Dressing: Total assistance, Bed level Lower Body Dressing Details (indicate cue type and reason): Mod A+2 partial sit>stand Toilet Transfer: +2 for physical assistance, Maximal assistance, Stand-pivot Toilet Transfer Details (indicate cue type and reason): simulated to chair Toileting- Clothing Manipulation and Hygiene: Total assistance Toileting - Clothing Manipulation Details (indicate cue type and reason): Mod A+2 partial sit>stand   Cognition: Cognition Overall Cognitive Status: Impaired/Different from baseline Orientation Level: Oriented to person Cognition Arousal/Alertness: Awake/alert Behavior During Therapy: Restless, Impulsive Overall Cognitive Status: Impaired/Different from baseline Area of Impairment: Orientation, Following commands, Safety/judgement, Problem solving Orientation Level: Disoriented to, Place, Situation Following Commands:  Follows one step commands inconsistently, Follows one step commands with increased time Safety/Judgement: Decreased awareness of safety, Decreased awareness of deficits Problem Solving: Decreased initiation, Requires verbal cues, Requires tactile cues General Comments: following cues more consistnetly today   Physical Exam: Blood pressure (!) 150/82, pulse 96, temperature 98 F (36.7 C), temperature source Oral, resp. rate 13, height 5\' 6"  (1.676 m), weight 79.8 kg, SpO2 94 %. Physical Exam  Constitutional:  obese  HENT:  Head: Normocephalic and atraumatic.  NGT  Eyes: Pupils are equal, round, and reactive to light. Right eye exhibits no discharge. Left eye exhibits no discharge.  Neck: Normal range of motion. No tracheal deviation present. No thyromegaly present.  Cardiovascular: Normal rate. Exam reveals no friction rub.  No murmur heard. Respiratory: Effort normal. No respiratory distress. He has no wheezes.  GI: He exhibits no distension. There is no abdominal tenderness.  Neurological:  Patient is alert but would go back to sleep during exam. Nasogastric tube in place. He does make eye contact. Dysarthric.  Follows some simple commands. UE 3/5 prox to distal. LE: 2-3/5 HF, KE and 3-4/5 ADF,PF. Senses pain in all 4's.       Lab Results Last 48 Hours        Results for orders placed or performed during the hospital encounter of 04/21/18 (from the past 48 hour(s))  Glucose, capillary     Status: Abnormal    Collection Time: 05/01/18 11:31 AM  Result Value Ref Range    Glucose-Capillary 246 (H) 70 - 99 mg/dL  Glucose, capillary     Status: Abnormal    Collection Time: 05/01/18  3:03 PM  Result Value Ref Range    Glucose-Capillary 257 (H) 70 - 99 mg/dL  Glucose, capillary     Status: Abnormal    Collection Time: 05/01/18  7:53 PM  Result Value Ref Range    Glucose-Capillary 264 (H) 70 - 99 mg/dL  Glucose, capillary     Status: Abnormal    Collection Time: 05/01/18 10:57 PM    Result Value Ref Range    Glucose-Capillary 279 (H) 70 - 99 mg/dL  Glucose, capillary     Status: Abnormal    Collection Time: 05/02/18  3:45 AM  Result Value Ref Range    Glucose-Capillary 218 (H) 70 - 99 mg/dL  CBC     Status: Abnormal    Collection Time: 05/02/18  5:45 AM  Result Value Ref Range    WBC 10.7 (H) 4.0 - 10.5 K/uL    RBC 4.10 (L) 4.22 - 5.81 MIL/uL    Hemoglobin 12.6 (L) 13.0 - 17.0 g/dL    HCT 40.5 39.0 - 52.0 %    MCV 98.8 80.0 - 100.0 fL    MCH 30.7 26.0 - 34.0  pg    MCHC 31.1 30.0 - 36.0 g/dL    RDW 13.9 11.5 - 15.5 %    Platelets 309 150 - 400 K/uL    nRBC 0.0 0.0 - 0.2 %      Comment: Performed at [facility], [place] [address], [place], [place] [address]  Basic metabolic panel     Status: Abnormal    Collection Time: 05/02/18  5:45 AM  Result Value Ref Range    Sodium 147 (H) 135 - 145 mmol/L    Potassium 3.6 3.5 - 5.1 mmol/L    Chloride 112 (H) 98 - 111 mmol/L    CO2 26 22 - 32 mmol/L    Glucose, Bld 235 (H) 70 - 99 mg/dL    BUN 21 8 - 23 mg/dL    Creatinine, Ser 0.75 0.61 - 1.24 mg/dL    Calcium 8.7 (L) 8.9 - 10.3 mg/dL    GFR calc non Af Amer >60 >60 mL/min    GFR calc Af Amer >60 >60 mL/min    Anion gap 9 5 - 15      Comment: Performed at [facility], [place] [address], [place], [place] [address]  Hemoglobin A1c     Status: Abnormal    Collection Time: 05/02/18  7:37 AM  Result Value Ref Range    Hgb A1c MFr Bld 11.8 (H) 4.8 - 5.6 %      Comment: (NOTE) Pre diabetes:          5.7%-6.4% Diabetes:              >6.4% Glycemic control for   <7.0% adults with diabetes      Mean Plasma Glucose 291.96 mg/dL      Comment: Performed at [facility] [address] [place], [place] [address]  Glucose, capillary     Status: Abnormal    Collection Time: 05/02/18  8:06 AM  Result Value Ref Range    Glucose-Capillary 249 (H) 70 - 99 mg/dL  Magnesium     Status: None    Collection Time: 05/02/18  9:06 AM  Result Value Ref Range     Magnesium 1.9 1.7 - 2.4 mg/dL      Comment: Performed at [facility], [place] [address], [place], [place] [address]  Phosphorus     Status: None    Collection Time: 05/02/18  9:06 AM  Result Value Ref Range    Phosphorus 3.4 2.5 - 4.6 mg/dL      Comment: Performed at [facility] [address] [place], [place] [address]  Glucose, capillary     Status: Abnormal    Collection Time: 05/02/18 11:18 AM  Result Value Ref Range    Glucose-Capillary 251 (H) 70 - 99 mg/dL  Glucose, capillary     Status: Abnormal    Collection Time: 05/02/18  6:16 PM  Result Value Ref Range    Glucose-Capillary 253 (H) 70 - 99 mg/dL  Glucose, capillary     Status: Abnormal    Collection Time: 05/02/18  8:12 PM  Result Value Ref Range    Glucose-Capillary 249 (H) 70 - 99 mg/dL  Glucose, capillary     Status: Abnormal    Collection Time: 05/03/18 12:16 AM  Result Value Ref Range    Glucose-Capillary 290 (H) 70 - 99 mg/dL  Glucose, capillary     Status: Abnormal    Collection Time: 05/03/18  4:03 AM  Result Value  Ref Range    Glucose-Capillary 203 (H) 70 - 99 mg/dL  CBC with Differential/Platelet     Status: Abnormal    Collection Time: 05/03/18  5:56 AM  Result Value Ref Range    WBC 11.4 (H) 4.0 - 10.5 K/uL    RBC 4.06 (L) 4.22 - 5.81 MIL/uL    Hemoglobin 12.3 (L) 13.0 - 17.0 g/dL    HCT 40.1 39.0 - 52.0 %    MCV 98.8 80.0 - 100.0 fL    MCH 30.3 26.0 - 34.0 pg    MCHC 30.7 30.0 - 36.0 g/dL    RDW 14.4 11.5 - 15.5 %    Platelets 272 150 - 400 K/uL    nRBC 0.0 0.0 - 0.2 %    Neutrophils Relative % 76 %    Neutro Abs 8.7 (H) 1.7 - 7.7 K/uL    Lymphocytes Relative 17 %    Lymphs Abs 2.0 0.7 - 4.0 K/uL    Monocytes Relative 4 %    Monocytes Absolute 0.5 0.1 - 1.0 K/uL    Eosinophils Relative 1 %    Eosinophils Absolute 0.1 0.0 - 0.5 K/uL    Basophils Relative 1 %    Basophils Absolute 0.1 0.0 - 0.1 K/uL    Immature Granulocytes 1 %    Abs Immature Granulocytes 0.10 (H) 0.00 - 0.07 K/uL       Comment: Performed at [facility], [address], [place], [place] [address]  Comprehensive metabolic panel     Status: Abnormal    Collection Time: 05/03/18  5:56 AM  Result Value Ref Range    Sodium 146 (H) 135 - 145 mmol/L    Potassium 4.4 3.5 - 5.1 mmol/L    Chloride 111 98 - 111 mmol/L    CO2 26 22 - 32 mmol/L    Glucose, Bld 224 (H) 70 - 99 mg/dL    BUN 20 8 - 23 mg/dL    Creatinine, Ser 0.83 0.61 - 1.24 mg/dL    Calcium 8.5 (L) 8.9 - 10.3 mg/dL    Total Protein 6.4 (L) 6.5 - 8.1 g/dL    Albumin 2.2 (L) 3.5 - 5.0 g/dL    AST 63 (H) 15 - 41 U/L    ALT 47 (H) 0 - 44 U/L    Alkaline Phosphatase 99 38 - 126 U/L    Total Bilirubin 0.8 0.3 - 1.2 mg/dL    GFR calc non Af Amer >60 >60 mL/min    GFR calc Af Amer >60 >60 mL/min    Anion gap 9 5 - 15      Comment: Performed at [facility], [place] [address], [place], [place] [address]  Magnesium     Status: None    Collection Time: 05/03/18  5:56 AM  Result Value Ref Range    Magnesium 1.8 1.7 - 2.4 mg/dL      Comment: Performed at [facility] [address], [place], [place] [address]  Phosphorus     Status: None    Collection Time: 05/03/18  5:56 AM  Result Value Ref Range    Phosphorus 4.0 2.5 - 4.6 mg/dL      Comment: Performed at [facility] [address], [place], [place] [address]  Glucose, capillary     Status: Abnormal    Collection Time: 05/03/18  7:54 AM  Result Value Ref Range    Glucose-Capillary 114 (H) 70 - 99 mg/dL  Imaging Results (Last 48 hours)  Dg Chest Port 1 View   Result Date: 05/03/2018 CLINICAL DATA:  Shortness of breath EXAM: PORTABLE CHEST 1 VIEW COMPARISON:  04/27/2018 FINDINGS: Endotracheal tube has been removed. Feeding catheter remains extending into the stomach. Postsurgical changes in the cervical spine are noted. Cardiac shadow is stable. Left lung remains clear. Persistent right upper lobe infiltrate is noted IMPRESSION: Stable right upper lobe infiltrate.  Electronically Signed   By: Inez Catalina M.D.   On: 05/03/2018 08:18             Medical Problem List and Plan: 1.  Encephalopathy/debility secondary to  Acute hypoxic respiratory failure/RUL pneumonia complicated by history of COPD oxygen dependent. Continue nebulizers as directed             -admit to inpatient rehab 2.  DVT Prophylaxis/Anticoagulation: Lovenox. Monitor for any bleeding episodes 3. Pain Management:  Tylenol as needed 4. Mood/delirium:  Provide emotional support 5. Neuropsych: This patient is capable of making decisions on his own behalf. 6. Skin/Wound Care:  Routine skin checks 7. Fluids/Electrolytes/Nutrition:  Routine in and out's with follow-up chemistries 8. Dysphagia.Cortrak tube.Swallow study 05/03/2018 dysphagia #1 honey liquids. Follow-up speech therapy for advancement of diet 9.ID/Strep Pneumo Bacteremia. Continue ceftriaxone through 05/05/2018 and stop 10. CAD with stenting. Plavix. 11. Hypertension. Clonidine 0.1 mg daily, Toprol 50 mg daily 12. Diabetes mellitus with peripheral neuropathy. Levemir 10 units twice a day. Check blood sugars before meals and at bedtime. Patient on Glucophage 500 mg daily prior to admission             -adjust regimen as needed 13. History of gout. Monitor for any flareups 14. Constipation. Laxative assistance.   Post Admission Physician Evaluation: 1. Functional deficits secondary  to encephalopathy, debility. 2. Patient is admitted to receive collaborative, interdisciplinary care between the physiatrist, rehab nursing staff, and therapy team. 3. Patient's level of medical complexity and substantial therapy needs in context of that medical necessity cannot be provided at a lesser intensity of care such as a SNF. 4. Patient has experienced substantial functional loss from his/her baseline which was documented above under the "Functional History" and "Functional Status" headings.  Judging by the patient's diagnosis, physical exam,  and functional history, the patient has potential for functional progress which will result in measurable gains while on inpatient rehab.  These gains will be of substantial and practical use upon discharge  in facilitating mobility and self-care at the household level. 5. Physiatrist will provide 24 hour management of medical needs as well as oversight of the therapy plan/treatment and provide guidance as appropriate regarding the interaction of the two. 6. The Preadmission Screening has been reviewed and patient status is unchanged unless otherwise stated above. 7. 24 hour rehab nursing will assist with bladder management, bowel management, safety, skin/wound care, disease management, medication administration, pain management and patient education  and help integrate therapy concepts, techniques,education, etc. 8. PT will assess and treat for/with: Lower extremity strength, range of motion, stamina, balance, functional mobility, safety, adaptive techniques and equipment, NMR, family ed.   Goals are: supervision to min assist. 9. OT will assess and treat for/with: ADL's, functional mobility, safety, upper extremity strength, adaptive techniques and equipment, NMR, family education.   Goals are: supervision to min assist. Therapy may proceed with showering this patient. 10. SLP will assess and treat for/with: cognition, communication, swallowing.  Goals are: supervision to min assist. 11. Case Management and Social Worker will assess and treat for  psychological issues and discharge planning. 12. Team conference will be held weekly to assess progress toward goals and to determine barriers to discharge. 13. Patient will receive at least 3 hours of therapy per day at least 5 days per week. 14. ELOS: 14-18 days       15. Prognosis:  excellent     I have personally performed a face to face diagnostic evaluation of this patient and formulated the key components of the plan.  Additionally, I have personally  reviewed laboratory data, imaging studies, as well as relevant notes and concur with the physician assistant's documentation above.   Meredith Staggers, MD, FAAPMR     Lavon Paganini Angiulli, PA-C 05/03/2018  This H&P/documentation was completed on 05/03/2018. I have pulled it into the inpatient rehab encounter for the purpose of charting.   (As of 05/03/2018) The patient's status has not changed. The original post admission physician evaluation remains appropriate, and any changes from the pre-admission screening or documentation from the acute chart are noted above.   Meredith Staggers, MD 05/06/2018

## 2018-05-06 NOTE — Progress Notes (Signed)
Physical Therapy Session Note  Patient Details  Name: Paul [name] MRN: [id number] Date of Birth: February 15, 1951  Today's Date: 05/06/2018 PT Individual Time: 0930-1030 PT Individual Time Calculation (min): 60 min   Short Term Goals: Week 1:  PT Short Term Goal 1 (Week 1): Pt will complete bed mobility with min assist.  PT Short Term Goal 2 (Week 1): Pt will ambulate 25 ft with LRAD & mod assist. PT Short Term Goal 3 (Week 1): Pt will propel w/c 50 ft without need for rest break for cardiopulmonary endurance training.  PT Short Term Goal 4 (Week 1): Pt will initiate stair training.   Skilled Therapeutic Interventions/Progress Updates:    Session focused on functional strengthening and endurance, sit <> stands, transfers with RW, gait training, therex for LE strengthening, and Nustep for BUE and BLE strengthening and muscular endurance (x 5 min on level 3). Pt requires overall min assist for sit <> stands and mobility and despite cues, pt pulls up on RW for sit <> stands and does not reach back during descent to chair. Educated on importance of this for safety. Pt instructed in seated LAQ and marches for warm up and marching in standing as pt reports he does not think he can walk. With encouragement, able to gait with RW and min assist x 10', x 15' and x 5' with narrow BOS noted, uneven step length, and quick to fatigue with cues for upright posture. O2 and HR remained WNL with activity but pt reports shortness of breath and fatigue. Cues for deep breathing. D/c planning discussed and pt reports having family/friends available at d/c to assist with household management activities and to assist with his dog. Pt did demonstrate insight into his current situation and inability to go home and care for himself independently at this time. He is discouraged by this, and emotional support provided. End of session request to get into recliner, required mod assist for sit -> stand due to fatigue and min assist for  transfer with RW with verbal cues for technique. Handoff to OT.   Therapy Documentation Precautions:  Precautions Precautions: Fall Precaution Comments: Supplemental 02 Restrictions Weight Bearing Restrictions: No   Vital Signs: Oxygen Therapy SpO2: 95-96 % O2 Device: Nasal Cannula 2 L  HR = 96-97 bpm with activity  Pain: Pain Assessment Pain Score: 0-No pain  Reports some discomfort from sitting in chair - educated on repositioning as able.   Therapy/Group: Individual Therapy  Canary Brim Ivory Broad, PT, DPT, CBIS  05/06/2018, 10:44 AM

## 2018-05-06 NOTE — Care Management (Signed)
[facility] Individual Statement of Services  Patient Name:  Paul [name]  Date:  05/06/2018  Welcome to the [facility].  Our goal is to provide you with an individualized program based on your diagnosis and situation, designed to meet your specific needs.  With this comprehensive rehabilitation program, you will be expected to participate in at least 3 hours of rehabilitation therapies Monday-Friday, with modified therapy programming on the weekends.  Your rehabilitation program will include the following services:  Physical Therapy (PT), Occupational Therapy (OT), Speech Therapy (ST), 24 hour per day rehabilitation nursing, Therapeutic Recreaction (TR), Neuropsychology, Case Management (Social Worker), Rehabilitation Medicine, Nutrition Services and Pharmacy Services  Weekly team conferences will be held on Wednesdays to discuss your progress.  Your Social Worker will talk with you frequently to get your input and to update you on team discussions.  Team conferences with you and your family in attendance may also be held.  Expected length of stay: 2-3 week   Overall anticipated outcome: supervision  Depending on your progress and recovery, your program may change. Your Social Worker will coordinate services and will keep you informed of any changes. Your Social Worker's name and contact numbers are listed  below.  The following services may also be recommended but are not provided by the [facility] will be made to provide these services after discharge if needed.  Arrangements include referral to agencies that provide these services.  Your insurance has been verified to be:  Medicare; Production manager Your primary doctor is:  Philip Aspen  Pertinent information will be shared with your doctor and your insurance  company.  Social Worker:  [place], [place] or [phone]  Information discussed with and copy given to patient by: Lennart Pall, 05/06/2018, 12:10 PM

## 2018-05-06 NOTE — Progress Notes (Signed)
Speech Language Pathology Daily Session Note  Patient Details  Name: Paul [name] MRN: [id number] Date of Birth: [birth year]  Today's Date: 05/06/2018 SLP Individual Time: 1220-1255 SLP Individual Time Calculation (min): 35 min  Short Term Goals: Week 1: SLP Short Term Goal 1 (Week 1): Pt will consumed dys 1 textures and honey thick liquids with mod assist for use of swallowing precautions and minimal overt s/s of aspiration.  SLP Short Term Goal 2 (Week 1): Pt will utilize external aids to reorient to place, date, and situation with mod assist multimodal cues.   SLP Short Term Goal 3 (Week 1): Pt will sustain his attention to a basic, functional task for ~10 minutes with min cues for redirection.   SLP Short Term Goal 4 (Week 1): Pt will complete basic, familiar tasks with mod assist for functional problem solving.  SLP Short Term Goal 5 (Week 1): Pt will recall daily information with mod assist multimodal cues for use of external aids.   SLP Short Term Goal 6 (Week 1): Pt will return demonstration of at least 2 safety precautions with mod assist multimodal cues.    Skilled Therapeutic Interventions:Skilled ST services focused on swallow and cognitive skills. Pt's daughter in room. SLP facilitated PO consumption of dys 1 and HTL lunch tray, pt required encouragement to continue PO consumption, consumed 50% of meal, with appropriate rate and oral clearance. Pt demonstrated moderate coughing with and without PO consumption. SLP instructed pt's daughter to creat list of preferred foods to increase PO consumption. NG tube will be removed later today, would recommend dys 2 trials in future session to encourage PO consumption of preferred foods. Pt demonstrated sustained attention in 5 minute intervals with min A verbal cues. SLP facilitated orientation situation, place and time, pt required min A verbal cues to refer to external aids SLP posted in room. Pt was left in room with call bell within reach,  daughter in room and bed alarm set. Recommend to continue skilled ST services.     Pain Pain Assessment Pain Score: 0-No pain  Therapy/Group: Individual Therapy  Seamus Warehime  [facility] 05/06/2018, 4:00 PM

## 2018-05-06 NOTE — Plan of Care (Signed)
  Problem: RH BOWEL ELIMINATION Goal: RH STG MANAGE BOWEL W/MEDICATION W/ASSISTANCE Description STG Manage Bowel with Medication with Assistance. min  Outcome: Progressing Flowsheets (Taken 05/06/2018 1515) STG: Pt will manage bowels with medication with assistance: 4-Minimal assistance   Problem: RH BLADDER ELIMINATION Goal: RH STG MANAGE BLADDER WITH ASSISTANCE Description STG Manage Bladder With Assistance. min  Outcome: Progressing Flowsheets (Taken 05/06/2018 1515) STG: Pt will manage bladder with assistance: 4-Minimal assistance   Problem: RH SKIN INTEGRITY Goal: RH STG SKIN FREE OF INFECTION/BREAKDOWN Description Free of further breakdown. Free of infection while on rehab with mod assist  Outcome: Progressing Goal: RH STG MAINTAIN SKIN INTEGRITY WITH ASSISTANCE Description STG Maintain Skin Integrity With Assistance. Mod  Outcome: Progressing Flowsheets (Taken 05/06/2018 1515) STG: Maintain skin integrity with assistance: 5-Supervision/cueing   Problem: RH SAFETY Goal: RH STG ADHERE TO SAFETY PRECAUTIONS W/ASSISTANCE/DEVICE Description STG Adhere to Safety Precautions With Assistance/Device. mod  Outcome: Progressing Flowsheets (Taken 05/06/2018 1515) STG:Pt will adhere to safety precautions with assistance/device: 4-Minimal assistance

## 2018-05-06 NOTE — Progress Notes (Signed)
Inpatient Rehabilitation  Patient information reviewed and entered into eRehab system by Lavita Pontius M. Kiyon Fidalgo, M.A., CCC/SLP, PPS Coordinator.  Information including medical coding, functional ability and quality indicators will be reviewed and updated through discharge.    Per nursing patient was given "Data Collection Information Summary" for Patients in Inpatient Rehabilitation Facilities with attached "Privacy Act Statement-Health Care Records" upon admission.   

## 2018-05-06 NOTE — Progress Notes (Signed)
Occupational Therapy Session Note  Patient Details  Name: Paul [name] MRN: [id number] Date of Birth: [birth year]  Today's Date: 05/06/2018 OT Individual Time: 1030-1056 OT Individual Time Calculation (min): 26 min    Short Term Goals: Week 1:  OT Short Term Goal 1 (Week 1): Pt will complete sit<stand from toilet with Mod A  OT Short Term Goal 2 (Week 1): Pt will complete UB dressing with Min A  OT Short Term Goal 3 (Week 1): Pt will complete 1 grooming task in standing while maintaining stable vitals to increase standing endurance   Skilled Therapeutic Interventions/Progress Updates:    Pt received sitting in recliner with handoff from PT. Pt with no c/o pain but c/o fatigue and requesting to return to bed. Pt completed sit > stand transfer with mod A, min A to SPT. Pt sat EOB and changed shirt, with assistance for managing cortrak. Pt completed bed mobility to position himself properly with cueing for technique and manipulation of bed features. Pt requested water and was provided with honey thick water and cueing for swallowing technique, which pt self managed following initial instruction. Pt was left supine with all needs met, bed alarm set.   Therapy Documentation Precautions:  Precautions Precautions: Fall Precaution Comments: Supplemental 02 Restrictions Weight Bearing Restrictions: No Vital Signs: Therapy Vitals Patient Position (if appropriate): Sitting Oxygen Therapy O2 Device: Nasal Cannula O2 Flow Rate (L/min): 93 L/min Patient Activity (if Appropriate): In chair Pulse Oximetry Type: Intermittent Pain: Pain Assessment Pain Scale: 0-10 Pain Score: 0-No pain   Therapy/Group: Individual Therapy  Curtis Sites 05/06/2018, 10:56 AM

## 2018-05-07 ENCOUNTER — Inpatient Hospital Stay (HOSPITAL_COMMUNITY): Payer: Medicare Other | Admitting: Occupational Therapy

## 2018-05-07 ENCOUNTER — Inpatient Hospital Stay (HOSPITAL_COMMUNITY): Payer: Medicare Other | Admitting: Speech Pathology

## 2018-05-07 ENCOUNTER — Inpatient Hospital Stay (HOSPITAL_COMMUNITY): Payer: Medicare Other

## 2018-05-07 DIAGNOSIS — R0989 Other specified symptoms and signs involving the circulatory and respiratory systems: Secondary | ICD-10-CM

## 2018-05-07 LAB — GLUCOSE, CAPILLARY
Glucose-Capillary: 143 mg/dL — ABNORMAL HIGH (ref 70–99)
Glucose-Capillary: 197 mg/dL — ABNORMAL HIGH (ref 70–99)
Glucose-Capillary: 177 mg/dL — ABNORMAL HIGH (ref 70–99)
Glucose-Capillary: 135 mg/dL — ABNORMAL HIGH (ref 70–99)

## 2018-05-07 MED ORDER — ZOLPIDEM TARTRATE 5 MG PO TABS
5.0000 mg | ORAL_TABLET | Freq: Every day | ORAL | Status: DC
  Administered 2018-05-07 – 2018-05-08 (×2): 5 mg via ORAL
  Filled 2018-05-07 (×2): qty 1

## 2018-05-07 MED ORDER — ADULT MULTIVITAMIN W/MINERALS CH
1.0000 | ORAL_TABLET | Freq: Every day | ORAL | Status: DC
  Administered 2018-05-07 – 2018-05-22 (×16): 1 via ORAL
  Filled 2018-05-07 (×15): qty 1

## 2018-05-07 MED ORDER — PRO-STAT SUGAR FREE PO LIQD
30.0000 mL | Freq: Two times a day (BID) | ORAL | Status: DC
  Administered 2018-05-07 – 2018-05-22 (×30): 30 mL via ORAL
  Filled 2018-05-07 (×31): qty 30

## 2018-05-07 NOTE — Progress Notes (Signed)
Nutrition Follow-up  INTERVENTION:   - Continue Pro-stat 30 ml BID, each supplement provides 100 kcal and 15 grams of protein  - MVI with minerals daily  - Magic cup TID with meals, each supplement provides 290 kcal and 9 grams of protein  - Double protein portions with meals (RD ordered via [place])  NUTRITION DIAGNOSIS:   Increased nutrient needs related to other (thearpies), chronic illness (COPD) as evidenced by estimated needs.  Ongoing  GOAL:   Patient will meet greater than or equal to 90% of their needs  Progressing  MONITOR:   PO intake, Supplement acceptance, Diet advancement, Skin, Labs, Weight trends  REASON FOR ASSESSMENT:   Consult Enteral/tube feeding initiation and management  ASSESSMENT:   68 year old male who presented to the ED on 04/21/18 with SOB and AMS. Pt admitted with acute respiratory failure related to RUL pneumonia requiring intubation on admission, AKI, and DKA. PMH significant for COPD on home oxygen, CAD, HTN, DM, tobacco use. Pt extubated on 04/27/18 and Cortrak placed for TF on 04/29/18. Diet advanced to dysphagia 1 with honey-thick liquids after MBS on 05/03/18.  1/19 - Cortrak d/c  Weight has fluctuated between 167-171 lbs since admission to CIR.  Spoke with pt at bedside who reports he is pleased to have Cortrak removed. Pt reports that therapies are "exhausting me" and stated that he cried this morning "because I'm having trouble getting my bearings." RD provided encouragement.  Pt states that he is eating as well as he can and reports that he does experience early satiety.  Pt states that "my behind hurts." RN made aware.  Meal Completion: 25-100% x last 8 recorded meals  Medications reviewed and include: Pro-stat BID, SSI, Protonix, Senna IVF: 0.45% saline @ 75 ml/hr  Labs reviewed. CBG's: 143, 175, 118, 194 x 24 hours  Diet Order:   Diet Order            DIET - DYS 1 Room service appropriate? Yes; Fluid  consistency: Honey Thick  Diet effective now              EDUCATION NEEDS:   Not appropriate for education at this time  Skin:  Skin Assessment: Skin Integrity Issues: Stage I: R face Other: skin tear to scrotum  Last BM:  1/20 (medium type 4)  Height:   Ht Readings from Last 1 Encounters:  05/03/18 5\' 6"  (1.676 m)    Weight:   Wt Readings from Last 1 Encounters:  05/07/18 77.3 kg    Ideal Body Weight:  64.5 kg  BMI:  Body mass index is 27.52 kg/m.  Estimated Nutritional Needs:   Kcal:  2200-2400  Protein:  115-130 grams  Fluid:  >/= 2.0 L    Gaynell Face, MS, RD, LDN Inpatient Clinical Dietitian Pager: [phone] Weekend/After Hours: [phone]

## 2018-05-07 NOTE — Progress Notes (Signed)
Speech Language Pathology Daily Session Note  Patient Details  Name: Paul [name] MRN: [id number] Date of Birth: 03/21/1951  Today's Date: 05/07/2018 SLP Individual Time: 0905-1000 SLP Individual Time Calculation (min): 55 min  Short Term Goals: Week 1: SLP Short Term Goal 1 (Week 1): Pt will consumed dys 1 textures and honey thick liquids with mod assist for use of swallowing precautions and minimal overt s/s of aspiration.  SLP Short Term Goal 2 (Week 1): Pt will utilize external aids to reorient to place, date, and situation with mod assist multimodal cues.   SLP Short Term Goal 3 (Week 1): Pt will sustain his attention to a basic, functional task for ~10 minutes with min cues for redirection.   SLP Short Term Goal 4 (Week 1): Pt will complete basic, familiar tasks with mod assist for functional problem solving.  SLP Short Term Goal 5 (Week 1): Pt will recall daily information with mod assist multimodal cues for use of external aids.   SLP Short Term Goal 6 (Week 1): Pt will return demonstration of at least 2 safety precautions with mod assist multimodal cues.    Skilled Therapeutic Interventions:  Pt was seen for skilled ST targeting goals for dysphagia and cognition.  Pt was in bed upon therapist's arrival.  He was awake but lethargic, complaining of poor sleep due to pain in legs.  Despite fatigue, pt was pleasant and willing to participate in therapies at bed level.  Therapist facilitated the session with trials of dys 2 textures to continue working towards diet advancement.  Pt consumed advanced textures with distant supervision cues of use of swallowing precautions.  Recommend a trial meal tray of advanced textures at next available appointment prior to advancement.  SLP also facilitated the session with mod cues for use of external aids to reorient to place, date, and situation given that pt verbalized some confusion over reason for his hospitalization as well as decreased recall of today's  date or even current season.  Pt was left in bed with bed alarm set and call bell within reach.  Continue per current plan of care.    Pain Pain Assessment Pain Scale: Faces Pain Score: 4  Faces Pain Scale: Hurts even more Pain Type: Acute pain Pain Location: Buttocks Pain Orientation: Right;Left Pain Descriptors / Indicators: Aching Pain Frequency: Constant Pain Onset: On-going Patients Stated Pain Goal: 0 Pain Intervention(s): Repositioned Multiple Pain Sites: No  Therapy/Group: Individual Therapy  Teliah [place], Selinda Orion 05/07/2018, 12:27 PM

## 2018-05-07 NOTE — Progress Notes (Signed)
Occupational Therapy Session Note  Patient Details  Name: Paul [name] MRN: [id number] Date of Birth: 25-May-1950  Today's Date: 05/07/2018 OT Individual Time: 1031-1200 OT Individual Time Calculation (min): 89 min    Short Term Goals: Week 1:  OT Short Term Goal 1 (Week 1): Pt will complete sit<stand from toilet with Mod A  OT Short Term Goal 2 (Week 1): Pt will complete UB dressing with Min A  OT Short Term Goal 3 (Week 1): Pt will complete 1 grooming task in standing while maintaining stable vitals to increase standing endurance   Skilled Therapeutic Interventions/Progress Updates:    Pt worked on bathing, dressing, and toileting during session.  Min assist for transfer to the walk-in shower from bedside with use of the RW for support.  He completed all bathing sit to stand with min assist for balance.  O2 sats at 92% on room air with activity.  Min assist for toilet transfer with the RW to 3:1 as well as min assist for clothing management and hygiene.  He was able to complete dressing sit to stand from the EOB and the wheelchair.  Increased time secondary to fatigue and need for rest break.  He was able to perform LB dressing with min assist sit to stand as well as supervision for UB dressing.  Pt's daughter present for part of session.  Discussed expectations for initial 24 hour supervision at discharge and she voices understanding.  Finished session with pt completing sit to stand with min assist and then maintaining standing for 40 seconds with BUE support on the walker, before needing to rest.  Finished session with pt drinking thickened water with supervision.  Pt left in wheelchair with call button and phone in reach and bed alarm in place.    Therapy Documentation Precautions:  Precautions Precautions: Fall Precaution Comments: Supplemental 02 Restrictions Weight Bearing Restrictions: No   Vital Signs: Therapy Vitals Pulse Rate: 74 Resp: 18 Patient Position (if appropriate):  Sitting Oxygen Therapy SpO2: 92 % O2 Device: Room Air Pain: Pain Assessment Pain Scale: Faces Pain Score: 0-No pain Faces Pain Scale: Hurts even more Pain Type: Acute pain Pain Location: Buttocks Pain Orientation: Right;Left Pain Descriptors / Indicators: Aching Pain Frequency: Constant Pain Onset: On-going Patients Stated Pain Goal: 0 Pain Intervention(s): Repositioned Multiple Pain Sites: No ADL: See Care Tool Section for some details of ADL  Therapy/Group: Individual Therapy  Jennavieve Arrick OTR/L 05/07/2018, 12:43 PM

## 2018-05-07 NOTE — Progress Notes (Signed)
[facility] PHYSICAL MEDICINE & REHABILITATION PROGRESS NOTE   Subjective/Complaints: Patient seen laying in bed this morning.  He did not sleep well per sleep chart and nursing.  ROS: Limited due to cognition.   Objective:   No results found. Recent Labs    05/06/18 0606  WBC 7.3  HGB 11.4*  HCT 36.1*  PLT 226   Recent Labs    05/06/18 0606  NA 140  K 4.0  CL 106  CO2 25  GLUCOSE 107*  BUN 15  CREATININE 0.74  CALCIUM 8.2*    Intake/Output Summary (Last 24 hours) at 05/07/2018 1610 Last data filed at 05/07/2018 0745 Gross per 24 hour  Intake 1574.45 ml  Output 600 ml  Net 974.45 ml     Physical Exam: Vital Signs Blood pressure (!) 141/96, pulse 92, temperature 98.5 F (36.9 C), resp. rate 18, height 5\' 6"  (1.676 m), weight 77.3 kg, SpO2 94 %. Constitutional: No distress . Vital signs reviewed. HENT: Normocephalic.  Atraumatic.  Eyes: EOMI. No discharge. Cardiovascular: RRR.  No JVD. Respiratory: Wheezes. Normal effort.  + [place]. GI: BS +.  Nondistended. Musc: No edema or tenderness in extremities. Skin: Intact.  Warm and dry. Neurological:  Alert and oriented to person and place only.   Motor: Grossly 4-/5 throughout Psych: Tangential.  Assessment/Plan: 1. Functional deficits secondary to hypoxic encephalopathy and debility which require 3+ hours per day of interdisciplinary therapy in a comprehensive inpatient rehab setting.  Physiatrist is providing close team supervision and 24 hour management of active medical problems listed below.  Physiatrist and rehab team continue to assess barriers to discharge/monitor patient progress toward functional and medical goals  Care Tool:  Bathing    Body parts bathed by patient: Right arm, Left arm, Chest, Abdomen, Right upper leg, Left upper leg, Right lower leg, Left lower leg, Face   Body parts bathed by helper: Front perineal area, Buttocks     Bathing assist Assist Level: Moderate Assistance - Patient 50  - 74%     Upper Body Dressing/Undressing Upper body dressing   What is the patient wearing?: Pull over shirt    Upper body assist Assist Level: Supervision/Verbal cueing    Lower Body Dressing/Undressing Lower body dressing      What is the patient wearing?: Pants     Lower body assist Assist for lower body dressing: Moderate Assistance - Patient 50 - 74%     Toileting Toileting Toileting Activity did not occur Landscape architect and hygiene only): (incontinent care)  Toileting assist Assist for toileting: Moderate Assistance - Patient 50 - 74%     Transfers Chair/bed transfer  Transfers assist  Chair/bed transfer activity did not occur: Safety/medical concerns  Chair/bed transfer assist level: Moderate Assistance - Patient 50 - 74%     Locomotion Ambulation   Ambulation assist   Ambulation activity did not occur: Safety/medical concerns  Assist level: Minimal Assistance - Patient > 75% Assistive device: Walker-rolling Max distance: 15'   Walk 10 feet activity   Assist  Walk 10 feet activity did not occur: Safety/medical concerns  Assist level: Minimal Assistance - Patient > 75%     Walk 50 feet activity   Assist Walk 50 feet with 2 turns activity did not occur: Safety/medical concerns         Walk 150 feet activity   Assist Walk 150 feet activity did not occur: Safety/medical concerns         Walk 10 feet on uneven surface  activity   Assist Walk 10 feet on uneven surfaces activity did not occur: Safety/medical concerns         Wheelchair     Assist Will patient use wheelchair at discharge?: Yes Type of Wheelchair: Manual    Wheelchair assist level: Supervision/Verbal cueing Max wheelchair distance: 20 ft     Wheelchair 50 feet with 2 turns activity    Assist    Wheelchair 50 feet with 2 turns activity did not occur: Safety/medical concerns       Wheelchair 150 feet activity     Assist Wheelchair 150  feet activity did not occur: Safety/medical concerns         Medical Problem List and Plan: 1.  Encephalopathy/debility secondary to  Acute hypoxic respiratory failure/RUL pneumonia complicated by history of COPD oxygen dependent. Continue nebulizers as directed  Continue CIR 2.  DVT Prophylaxis/Anticoagulation: Lovenox. Monitor for any bleeding episodes 3. Pain Management:  Tylenol as needed 4. Mood/delirium:  Provide emotional support 5. Neuropsych: This patient is  not capable of making decisions on his own behalf. 6. Skin/Wound Care:   Added Diflucan 100 mg p.o. twice daily x3 (through today) days for inguinal rash 7. Fluids/Electrolytes/Nutrition:  Routine in and out's with follow-up chemistries 8. Dysphagia.Cortrak tube: Swallow study 05/03/2018 dysphagia #1 honey liquids.  DCed NG  IVF nightly 9. ID/Strep Pneumo Bacteremia.  Completed ceftriaxone on 05/05/2018  10. CAD with stenting. Plavix. 11. Hypertension. Clonidine 0.1 mg daily, Toprol 50 mg daily  Labile on 1/21 12. Diabetes mellitus with peripheral neuropathy. Levemir BID. Checking blood sugars before meals and at bedtime. Patient on Glucophage 500 mg daily prior to admission             Reduced Levemir to 10 units twice daily as we convert to orals alone, DC'd on 1/20  Improving, will consider further increase in medications tomorrow pointed 13. History of gout. Monitor for any flareups 14. Constipation. Laxative assistance.  15.  Acute blood loss anemia  Hemoglobin 11.4 on 1/20  Continue to monitor 16.  Hypoalbuminemia  Supplement initiated 17.  Sleep disturbance  Seroquel DC'd  Melatonin started on 1/20, DC'd on 1/21  Ambien started on 1/21   LOS: 4 days A FACE TO FACE EVALUATION WAS PERFORMED  Ankit Lorie Phenix 05/07/2018, 8:33 AM

## 2018-05-07 NOTE — Progress Notes (Signed)
Physical Therapy Session Note  Patient Details  Name: Paul [name] MRN: [id number] Date of Birth: 07-Jun-1950  Today's Date: 05/07/2018 PT Individual Time: 0800-0900 PT Individual Time Calculation (min): 60 min   Short Term Goals: Week 1:  PT Short Term Goal 1 (Week 1): Pt will complete bed mobility with min assist.  PT Short Term Goal 2 (Week 1): Pt will ambulate 25 ft with LRAD & mod assist. PT Short Term Goal 3 (Week 1): Pt will propel w/c 50 ft without need for rest break for cardiopulmonary endurance training.  PT Short Term Goal 4 (Week 1): Pt will initiate stair training.   Skilled Therapeutic Interventions/Progress Updates:    Pt seated EOB with RN upon PT arrival, agreeable to therapy tx but pt reports knee pain 10/10 and he was unable to sleep at all last night. Pt seated EOB donned pants, performed sit<>stand with min assist and RW to pull pants over hips. Pt performed stand pivot to w/c with min assist and RW, verbal cues for techniques. Pt reports he wants to wait until later to work on ambulation since his knees are painful. Pt performed seated therex edge of mat for strengthening including 2 x 10 of each: LAQ, seated marches, hip abduction with orange theraband, heel slides, shoulder press with 3# dowel, chest press with 3# dowel, bicep curls with 3# dowel. Pt worked on standing balance using RW while tossing horseshoes, x 2 trials with CGA for balance. Pt performed stand pivot back to w/c with RW and min assist. Pt propelled w/c x 40 ft with B UEs working on endurance and activity tolerance. Pt used kinetron while sitting in w/c this session 3 x 1 min bouts working on LE strength and endurance. Pt transported back to room and ambulated x 6 ft into bathroom, min assist with RW. Therapist assisted with clothing management. Pt reports he does not need to use bathroom anymore and wants to get back to bed. Pt ambulated back to bed with min assist and RW, left supine with needs in reach and  bed alarm set.    Therapy Documentation Precautions:  Precautions Precautions: Fall Precaution Comments: Supplemental 02 Restrictions Weight Bearing Restrictions: No    Therapy/Group: Individual Therapy  Netta Corrigan, PT, DPT 05/07/2018, 7:50 AM

## 2018-05-08 ENCOUNTER — Inpatient Hospital Stay (HOSPITAL_COMMUNITY): Payer: Medicare Other | Admitting: Occupational Therapy

## 2018-05-08 ENCOUNTER — Encounter (HOSPITAL_COMMUNITY): Payer: Medicare Other | Admitting: Psychology

## 2018-05-08 ENCOUNTER — Inpatient Hospital Stay (HOSPITAL_COMMUNITY): Payer: PRIVATE HEALTH INSURANCE | Admitting: Occupational Therapy

## 2018-05-08 ENCOUNTER — Inpatient Hospital Stay (HOSPITAL_COMMUNITY): Payer: Medicare Other | Admitting: Speech Pathology

## 2018-05-08 ENCOUNTER — Inpatient Hospital Stay (HOSPITAL_COMMUNITY): Payer: PRIVATE HEALTH INSURANCE | Admitting: Physical Therapy

## 2018-05-08 DIAGNOSIS — E1142 Type 2 diabetes mellitus with diabetic polyneuropathy: Secondary | ICD-10-CM

## 2018-05-08 LAB — GLUCOSE, CAPILLARY
Glucose-Capillary: 162 mg/dL — ABNORMAL HIGH (ref 70–99)
Glucose-Capillary: 198 mg/dL — ABNORMAL HIGH (ref 70–99)
Glucose-Capillary: 100 mg/dL — ABNORMAL HIGH (ref 70–99)
Glucose-Capillary: 154 mg/dL — ABNORMAL HIGH (ref 70–99)

## 2018-05-08 MED ORDER — METFORMIN HCL 500 MG PO TABS
250.0000 mg | ORAL_TABLET | Freq: Every day | ORAL | Status: DC
  Administered 2018-05-08 – 2018-05-22 (×15): 250 mg via ORAL
  Filled 2018-05-08 (×15): qty 1

## 2018-05-08 MED ORDER — IPRATROPIUM-ALBUTEROL 0.5-2.5 (3) MG/3ML IN SOLN
3.0000 mL | Freq: Two times a day (BID) | RESPIRATORY_TRACT | Status: DC
  Administered 2018-05-08 – 2018-05-22 (×27): 3 mL via RESPIRATORY_TRACT
  Filled 2018-05-08 (×28): qty 3

## 2018-05-08 NOTE — Progress Notes (Signed)
Speech Language Pathology Daily Session Note  Patient Details  Name: Paul [name] MRN: [id number] Date of Birth: 1950/12/07  Today's Date: 05/08/2018 SLP Individual Time: 1105-1205 SLP Individual Time Calculation (min): 60 min  Short Term Goals: Week 1: SLP Short Term Goal 1 (Week 1): Pt will consumed dys 1 textures and honey thick liquids with mod assist for use of swallowing precautions and minimal overt s/s of aspiration.  SLP Short Term Goal 2 (Week 1): Pt will utilize external aids to reorient to place, date, and situation with mod assist multimodal cues.   SLP Short Term Goal 3 (Week 1): Pt will sustain his attention to a basic, functional task for ~10 minutes with min cues for redirection.   SLP Short Term Goal 4 (Week 1): Pt will complete basic, familiar tasks with mod assist for functional problem solving.  SLP Short Term Goal 5 (Week 1): Pt will recall daily information with mod assist multimodal cues for use of external aids.   SLP Short Term Goal 6 (Week 1): Pt will return demonstration of at least 2 safety precautions with mod assist multimodal cues.    Skilled Therapeutic Interventions: Pt was seen for skilled ST targeting cognition and dysphagia. Pt was pleasant and appropriate upon our greeting, however he expressed feeling restless and being unable to sleep for several days. Pt was transferred to sitting at the edge of bed to participate in mildly complex cognitive tasks. He required fluctuating levels of assistance (from min to max) via multimodal cues to complete a money counting activity. With mod assist and prompts from the clinician, pt conveyed intellectual awareness evidenced by providing of examples of functional ADLs that he would need assistance with upon discharge from the hospital. Given set up and supervision assistance, pt demonstrated use of swallow strategies and no overt s/s aspiration throughout consumption of honey thick liquids and dysphagia 2 (fine chop) lunch  tray. Of note, pt reported feeling short of breath nearing the end of his meal; clinician reinforced importance of rest breaks during eating and drinking to optimize coordination between breathing and swallowing given pt's baseline diagnosis of COPD. Recommend advancing pt's diet to dysphagia 2 texture, continue with honey thick liquids for now. Pt was left sitting at the edge of the bed with daughter present and all needs met.      Pain Pain Assessment Pain Scale: 0-10 Pain Score: 0-No pain  Therapy/Group: Individual Therapy  Paul [name], Student SLP  Paul [name] 05/08/2018, 12:20 PM

## 2018-05-08 NOTE — Plan of Care (Signed)
  Problem: RH BOWEL ELIMINATION Goal: RH STG MANAGE BOWEL W/MEDICATION W/ASSISTANCE Description STG Manage Bowel with Medication with Assistance. min  Outcome: Progressing Flowsheets (Taken 05/08/2018 1310) STG: Pt will manage bowels with medication with assistance: 4-Minimal assistance   Problem: RH BLADDER ELIMINATION Goal: RH STG MANAGE BLADDER WITH ASSISTANCE Description STG Manage Bladder With Assistance. min  Outcome: Progressing Flowsheets (Taken 05/08/2018 1310) STG: Pt will manage bladder with assistance: 5-Supervision/cueing   Problem: RH SKIN INTEGRITY Goal: RH STG SKIN FREE OF INFECTION/BREAKDOWN Description Free of further breakdown. Free of infection while on rehab with mod assist  Outcome: Progressing Goal: RH STG MAINTAIN SKIN INTEGRITY WITH ASSISTANCE Description STG Maintain Skin Integrity With Assistance. Mod  Outcome: Progressing Flowsheets (Taken 05/08/2018 1310) STG: Maintain skin integrity with assistance: 4-Minimal assistance   Problem: RH SAFETY Goal: RH STG ADHERE TO SAFETY PRECAUTIONS W/ASSISTANCE/DEVICE Description STG Adhere to Safety Precautions With Assistance/Device. mod  Outcome: Progressing Flowsheets (Taken 05/08/2018 1310) STG:Pt will adhere to safety precautions with assistance/device: 3-Moderate assistance

## 2018-05-08 NOTE — Progress Notes (Addendum)
[place] PHYSICAL MEDICINE & REHABILITATION PROGRESS NOTE   Subjective/Complaints: Patient seen lying in bed this morning.  He did not sleep well overnight.  Per nursing, patient with confusion overnight.  Patient states it was because he was confused about his medications.  ROS: Limited due to cognition, but appears to deny CP, shortness of breath, nausea, vomiting, diarrhea.   Objective:   No results found. Recent Labs    05/06/18 0606  WBC 7.3  HGB 11.4*  HCT 36.1*  PLT 226   Recent Labs    05/06/18 0606  NA 140  K 4.0  CL 106  CO2 25  GLUCOSE 107*  BUN 15  CREATININE 0.74  CALCIUM 8.2*    Intake/Output Summary (Last 24 hours) at 05/08/2018 0843 Last data filed at 05/08/2018 0400 Gross per 24 hour  Intake 944.24 ml  Output 850 ml  Net 94.24 ml     Physical Exam: Vital Signs Blood pressure (!) 155/85, pulse 84, temperature 97.7 F (36.5 C), temperature source Oral, resp. rate 16, height 5\' 6"  (1.676 m), weight 76.6 kg, SpO2 91 %. Constitutional: No distress . Vital signs reviewed. HENT: Normocephalic.  Atraumatic.  Eyes: EOMI. No discharge. Cardiovascular: RRR.  No JVD. Respiratory: Wheezes. Normal effort.  + Burchard. GI: BS +.  Nondistended. Musc: No edema or tenderness in extremities. Skin: Intact.  Warm and dry. Neurological:  Alert and oriented to person, place, year.   Motor: Grossly 4-/5 throughout, stable Psych: Confused.  Assessment/Plan: 1. Functional deficits secondary to hypoxic encephalopathy and debility which require 3+ hours per day of interdisciplinary therapy in a comprehensive inpatient rehab setting.  Physiatrist is providing close team supervision and 24 hour management of active medical problems listed below.  Physiatrist and rehab team continue to assess barriers to discharge/monitor patient progress toward functional and medical goals  Care Tool:  Bathing    Body parts bathed by patient: Right arm, Left arm, Chest, Abdomen,  Right upper leg, Left upper leg, Right lower leg, Left lower leg, Face, Front perineal area, Buttocks   Body parts bathed by helper: Front perineal area, Buttocks     Bathing assist Assist Level: Minimal Assistance - Patient > 75%     Upper Body Dressing/Undressing Upper body dressing   What is the patient wearing?: Pull over shirt    Upper body assist Assist Level: Supervision/Verbal cueing    Lower Body Dressing/Undressing Lower body dressing      What is the patient wearing?: Pants, Incontinence brief     Lower body assist Assist for lower body dressing: Minimal Assistance - Patient > 75%     Toileting Toileting Toileting Activity did not occur (Clothing management and hygiene only): (incontinent care)  Toileting assist Assist for toileting: Moderate Assistance - Patient 50 - 74%     Transfers Chair/bed transfer  Transfers assist  Chair/bed transfer activity did not occur: Safety/medical concerns  Chair/bed transfer assist level: Moderate Assistance - Patient 50 - 74%     Locomotion Ambulation   Ambulation assist   Ambulation activity did not occur: Safety/medical concerns  Assist level: Minimal Assistance - Patient > 75% Assistive device: Walker-rolling Max distance: 10'   Walk 10 feet activity   Assist  Walk 10 feet activity did not occur: Safety/medical concerns  Assist level: Minimal Assistance - Patient > 75%     Walk 50 feet activity   Assist Walk 50 feet with 2 turns activity did not occur: Safety/medical concerns  Walk 150 feet activity   Assist Walk 150 feet activity did not occur: Safety/medical concerns         Walk 10 feet on uneven surface  activity   Assist Walk 10 feet on uneven surfaces activity did not occur: Safety/medical concerns         Wheelchair     Assist Will patient use wheelchair at discharge?: Yes Type of Wheelchair: Manual    Wheelchair assist level: Supervision/Verbal cueing Max  wheelchair distance: 20 ft     Wheelchair 50 feet with 2 turns activity    Assist    Wheelchair 50 feet with 2 turns activity did not occur: Safety/medical concerns       Wheelchair 150 feet activity     Assist Wheelchair 150 feet activity did not occur: Safety/medical concerns         Medical Problem List and Plan: 1.  Encephalopathy/debility secondary to  Acute hypoxic respiratory failure/RUL pneumonia complicated by history of COPD oxygen dependent. Continue nebulizers as directed  Continue CIR  Team conference today to discuss current and goals and coordination of care, home and environmental barriers, and discharge planning with nursing, case manager, and therapies.  2.  DVT Prophylaxis/Anticoagulation: Lovenox. Monitor for any bleeding episodes 3. Pain Management:  Tylenol as needed 4. Mood/delirium:  Provide emotional support 5. Neuropsych: This patient is  not capable of making decisions on his own behalf. 6. Skin/Wound Care:   Added Diflucan 100 mg p.o. twice daily x3 (completed on 1/21) days for inguinal rash 7. Fluids/Electrolytes/Nutrition:  Routine in and out's with follow-up chemistries 8. Dysphagia.Cortrak tube: Swallow study 05/03/2018 dysphagia #1 honey liquids.  DCed NG  IVF nightly 9. ID/Strep Pneumo Bacteremia.  Completed ceftriaxone on 05/05/2018  10. CAD with stenting. Plavix. 11. Hypertension. Clonidine 0.1 mg daily, Toprol 50 mg daily  Labile on 1/22 12. Diabetes mellitus with peripheral neuropathy. Levemir BID. Checking blood sugars before meals and at bedtime. Patient on Glucophage 500 mg daily prior to admission             Reduced Levemir to 10 units twice daily as we convert to orals alone, DC'd on 1/20  Glucophage to 500 mg daily started on 1/22 13. History of gout. Monitor for any flareups 14. Constipation. Laxative assistance.  15.  Acute blood loss anemia  Hemoglobin 11.4 on 1/20  Continue to monitor 16.  Hypoalbuminemia  Supplement  initiated 17.  Sleep disturbance  Seroquel DC'd  Melatonin started on 1/20  Ambien started on 1/21  Will continue to monitor and consider further medication adjustments tomorrow if necessary   LOS: 5 days A FACE TO FACE EVALUATION WAS PERFORMED  Enyla Lisbon Lorie Phenix 05/08/2018, 8:43 AM

## 2018-05-08 NOTE — Progress Notes (Signed)
Occupational Therapy Session Note  Patient Details  Name: Paul [name] MRN: [id number] Date of Birth: [birth year]  Today's Date: 05/08/2018 OT Individual Time: 1445-1520 OT Individual Time Calculation (min): 35 min    Short Term Goals: Week 1:  OT Short Term Goal 1 (Week 1): Pt will complete sit<stand from toilet with Mod A  OT Short Term Goal 2 (Week 1): Pt will complete UB dressing with Min A  OT Short Term Goal 3 (Week 1): Pt will complete 1 grooming task in standing while maintaining stable vitals to increase standing endurance   Skilled Therapeutic Interventions/Progress Updates:    Patient asleep in bed, easy to arouse and agreeable to participate in therapy session.  CS for bed mobility.  Min A to use urinal, min A to don pants seated at edge of bed.  SPT with RW bed to w/c min A.  Ambulation with RW CG (dep for O2 management)   Patient completed unsupported sitting conditioning activities with fair tolerance - SOB with 10-12 reps and need for rest break between exercises.  O2 tank replaced as needed.  Patient is pleasant and cooperative, safe with activities and able to verbalize needs.  Cues required for safe/prescribed liquid consistency.  Patient to PT session following OT.  Therapy Documentation Precautions:  Precautions Precautions: Fall Precaution Comments: Supplemental 02 Restrictions Weight Bearing Restrictions: No General:   Vital Signs:  Pain: Pain Assessment Pain Scale: 0-10 Pain Score: 0-No pain Pain Type: Chronic pain Pain Location: Buttocks Pain Intervention(s): Ambulation/increased activity  Therapy/Group: Individual Therapy  Carlos Levering 05/08/2018, 4:03 PM

## 2018-05-08 NOTE — Progress Notes (Signed)
Occupational Therapy Session Note  Patient Details  Name: Paul [name] MRN: [id number] Date of Birth: 1950-11-12  Today's Date: 05/08/2018 OT Individual Time: 0800-0902 OT Individual Time Calculation (min): 62 min    Short Term Goals: Week 1:  OT Short Term Goal 1 (Week 1): Pt will complete sit<stand from toilet with Mod A  OT Short Term Goal 2 (Week 1): Pt will complete UB dressing with Min A  OT Short Term Goal 3 (Week 1): Pt will complete 1 grooming task in standing while maintaining stable vitals to increase standing endurance   Skilled Therapeutic Interventions/Progress Updates:    Pt completed bathing and dressing sit to stand at the sink.  Mod instructional cueing for hand placement with all sit to stand transitions, with min assist to complete sit to stand.  He was able to stand pivot from the bed to the wheelchair with min assist as well.  Supervision for all UB selfcare with min assist for LB selfcare sit to stand.  Pt needed multiple rest breaks after standing with LB selfcare for no more than one minute.  Oxygen sats 89% on room air when tested.  He was able to donn pants over his left foot, but needed mod assist to donn them over the right foot after completing the left.  He crossed one LE over the opposite knee for washing his feet and for donning his socks and shoes.  Finished session with oral hygiene with setup using suction brush.  Pt left in wheelchair at end of session to rest.  Alarm belt activated, call button and phone in reach as well.   Therapy Documentation Precautions:  Precautions Precautions: Fall Precaution Comments: Supplemental 02 Restrictions Weight Bearing Restrictions: No  Vital Signs: Therapy Vitals Patient Position (if appropriate): Sitting Oxygen Therapy SpO2: (!) 89 % O2 Device: Room Air O2 Flow Rate (L/min): 2 L/min Patient Activity (if Appropriate): In chair Pulse Oximetry Type: Intermittent Pain: Pain Assessment Pain Scale: Faces Pain  Score: 0-No pain ADL: See Care Tool Section for some details of ADL  Therapy/Group: Individual Therapy  Thalia Turkington OTR/L 05/08/2018, 10:13 AM

## 2018-05-08 NOTE — Progress Notes (Signed)
Physical Therapy Session Note  Patient Details  Name: Paul [name] MRN: [id number] Date of Birth: 28-Apr-1950  Today's Date: 05/08/2018 PT Individual Time: 1520-1605 PT Individual Time Calculation (min): 45 min   Short Term Goals: Week 1:  PT Short Term Goal 1 (Week 1): Pt will complete bed mobility with min assist.  PT Short Term Goal 2 (Week 1): Pt will ambulate 25 ft with LRAD & mod assist. PT Short Term Goal 3 (Week 1): Pt will propel w/c 50 ft without need for rest break for cardiopulmonary endurance training.  PT Short Term Goal 4 (Week 1): Pt will initiate stair training.   Skilled Therapeutic Interventions/Progress Updates:   Pt received sitting in WC and agreeable to PT. PT instructed pt in gait training with RW 2 x 45. CGA-min assist from PT with min cues for posture and step length. PT instructed pt in WC mobility x 154f with supervision assist and min cues for turning technique and improved use of momentum to reduce overall energy expenditure. Stand pivot transfer to Nustep with RW and CGA. nustep reciprocal movement and endurance training 2 x 3 minutes with prolonged rest breaks between bouts due to extreme fatigue. Pt SpO2 monitored throughout treatment at >96% on 2 L/min. [facility] to [facility] and [facility] and all needs met.         Therapy Documentation Precautions:  Precautions Precautions: Fall Precaution Comments: Supplemental 02 Restrictions Weight Bearing Restrictions: No General:   Vital Signs: Therapy Vitals Temp: 97.8 F (36.6 C) Pulse Rate: 88 Resp: 15 BP: (!) 147/88 Patient Position (if appropriate): Sitting Oxygen Therapy SpO2: 97 % O2 Device: Room Air Pain: Pain Assessment Pain Scale: 0-10 Pain Score: 0-No pain Pain Type: Chronic pain Pain Location: Buttocks Pain Intervention(s): Ambulation/increased activity    Therapy/Group: Individual Therapy  ALorie Phenix1/22/2020, 5:18 PM

## 2018-05-09 ENCOUNTER — Inpatient Hospital Stay (HOSPITAL_COMMUNITY): Payer: Medicare Other | Admitting: Speech Pathology

## 2018-05-09 ENCOUNTER — Inpatient Hospital Stay (HOSPITAL_COMMUNITY): Payer: PRIVATE HEALTH INSURANCE | Admitting: Occupational Therapy

## 2018-05-09 ENCOUNTER — Inpatient Hospital Stay (HOSPITAL_COMMUNITY): Payer: Medicare Other | Admitting: Physical Therapy

## 2018-05-09 ENCOUNTER — Inpatient Hospital Stay (HOSPITAL_COMMUNITY): Payer: Medicare Other | Admitting: Occupational Therapy

## 2018-05-09 DIAGNOSIS — R7309 Other abnormal glucose: Secondary | ICD-10-CM

## 2018-05-09 LAB — GLUCOSE, CAPILLARY
Glucose-Capillary: 197 mg/dL — ABNORMAL HIGH (ref 70–99)
Glucose-Capillary: 163 mg/dL — ABNORMAL HIGH (ref 70–99)
Glucose-Capillary: 147 mg/dL — ABNORMAL HIGH (ref 70–99)
Glucose-Capillary: 134 mg/dL — ABNORMAL HIGH (ref 70–99)

## 2018-05-09 MED ORDER — FLUTICASONE PROPIONATE 50 MCG/ACT NA SUSP
1.0000 | Freq: Every day | NASAL | Status: DC
  Administered 2018-05-09 – 2018-05-22 (×11): 1 via NASAL
  Filled 2018-05-09: qty 16

## 2018-05-09 MED ORDER — ALPRAZOLAM 0.25 MG PO TABS
0.1250 mg | ORAL_TABLET | Freq: Four times a day (QID) | ORAL | Status: DC | PRN
  Administered 2018-05-09 – 2018-05-22 (×14): 0.125 mg via ORAL
  Filled 2018-05-09 (×16): qty 1

## 2018-05-09 MED ORDER — ZOLPIDEM TARTRATE 5 MG PO TABS
10.0000 mg | ORAL_TABLET | Freq: Every day | ORAL | Status: DC
  Administered 2018-05-09 – 2018-05-12 (×4): 10 mg via ORAL
  Filled 2018-05-09 (×4): qty 2

## 2018-05-09 NOTE — Plan of Care (Signed)
  Problem: RH BOWEL ELIMINATION Goal: RH STG MANAGE BOWEL W/MEDICATION W/ASSISTANCE Description STG Manage Bowel with Medication with Assistance. min  Outcome: Progressing   Problem: RH BLADDER ELIMINATION Goal: RH STG MANAGE BLADDER WITH ASSISTANCE Description STG Manage Bladder With Assistance. min  Outcome: Progressing   Problem: RH SKIN INTEGRITY Goal: RH STG SKIN FREE OF INFECTION/BREAKDOWN Description Free of further breakdown. Free of infection while on rehab with mod assist  Outcome: Progressing Goal: RH STG MAINTAIN SKIN INTEGRITY WITH ASSISTANCE Description STG Maintain Skin Integrity With Assistance. Mod  Outcome: Progressing   Problem: RH SAFETY Goal: RH STG ADHERE TO SAFETY PRECAUTIONS W/ASSISTANCE/DEVICE Description STG Adhere to Safety Precautions With Assistance/Device. mod  Outcome: Progressing

## 2018-05-09 NOTE — Progress Notes (Signed)
Occupational Therapy Session Note  Patient Details  Name: Paul [name] MRN: [id number] Date of Birth: 10-Dec-1950  Today's Date: 05/09/2018 OT Individual Time: 0930-1000 OT Individual Time Calculation (min): 30 min    Short Term Goals: Week 1:  OT Short Term Goal 1 (Week 1): Pt will complete sit<stand from toilet with Mod A  OT Short Term Goal 1 - Progress (Week 1): Met OT Short Term Goal 2 (Week 1): Pt will complete UB dressing with Min A  OT Short Term Goal 2 - Progress (Week 1): Met OT Short Term Goal 3 (Week 1): Pt will complete 1 grooming task in standing while maintaining stable vitals to increase standing endurance  OT Short Term Goal 3 - Progress (Week 1): Not met   OT Short Term Goal 1 (Week 2): Pt will complete LB bathing sit to stand with close supervision. OT Short Term Goal 2 (Week 2): Pt will complete LB dressing with supervision sit to stand.   OT Short Term Goal 3 (Week 2): Pt will maintain standing at the sink for 2 mins or greater to complete grooming tasks.   OT Short Term Goal 4 (Week 2): Pt will perform toilet transfer to the 3:1 with RW and close supervision.  Skilled Therapeutic Interventions/Progress Updates:    upon entering pt's room, he stated he was in a great deal of discomfort from sitting in his recliner.  Pt worked on sit to stands to [employer] with min A to get pressure off his bottom. His standing tolerance was only 30-45 sec each time. Pt kept saying that he feels that he can not get his breath. Pt on 2L of O2 at 98% sats.  Tried putting w/c cushion in recliner but pt did not feel that was comfortable.  After talking about his pain for so long, suggested pt get back in bed and work on positioning in [place].  Placed pillows between legs and a wedge behind his back to keep him off his sacrum completely. Pt stated he was more comfortable like this.  Pt resting in bed with alarm set and all needs met.  Therapy Documentation Precautions:  Precautions Precautions:  Fall Precaution Comments: Supplemental 02 Restrictions Weight Bearing Restrictions: No  Pain: Pain Assessment Pain Score: 6  Pain Type: Acute pain Pain Location: Buttocks Pain Orientation: Right;Left Pain Descriptors / Indicators: Discomfort Pain Onset: On-going Pain Intervention(s): Repositioned ADL: ADL Eating: Not assessed Grooming: Not assessed Upper Body Bathing: Supervision/safety Where Assessed-Upper Body Bathing: Sitting at sink Lower Body Bathing: Moderate assistance Where Assessed-Lower Body Bathing: Wheelchair, Sitting at sink, Standing at sink Upper Body Dressing: Moderate assistance Where Assessed-Upper Body Dressing: Wheelchair, Sitting at sink Lower Body Dressing: Maximal assistance Where Assessed-Lower Body Dressing: Standing at sink, Sitting at sink, Wheelchair Toileting: Moderate assistance Where Assessed-Toileting: Glass blower/designer: Maximal Print production planner Method: Stand pivot Tub/Shower Transfer: Not assessed  Therapy/Group: Individual Therapy  [place] 05/09/2018, 1:05 PM

## 2018-05-09 NOTE — Progress Notes (Signed)
[place] PHYSICAL MEDICINE & REHABILITATION PROGRESS NOTE   Subjective/Complaints: Patient seen laying in bed this AM.  He states he did not sleep well overnight.  He has questions regarding gout and sleep.  Discussed current pain and unlikelihood of gout being the source.  Discussed with nursing as well.  ROS: + Shortness of breath.  Denies CP, nausea, vomiting, diarrhea.   Objective:   No results found. No results for input(s): WBC, HGB, HCT, PLT in the last 72 hours. No results for input(s): NA, K, CL, CO2, GLUCOSE, BUN, CREATININE, CALCIUM in the last 72 hours.  Intake/Output Summary (Last 24 hours) at 05/09/2018 1004 Last data filed at 05/09/2018 0322 Gross per 24 hour  Intake 440 ml  Output 650 ml  Net -210 ml     Physical Exam: Vital Signs Blood pressure (!) 154/79, pulse 70, temperature 97.6 F (36.4 C), temperature source Oral, resp. rate 18, height 5\' 6"  (1.676 m), weight 74.8 kg, SpO2 98 %. Constitutional: No distress . Vital signs reviewed. HENT: Normocephalic.  Atraumatic.  Eyes: EOMI. No discharge. Cardiovascular: RRR.  No JVD. Respiratory: Wheezes. Normal effort.  + [place]. GI: BS +.  Nondistended. Musc: No edema or tenderness in extremities. Skin: Intact.  Warm and dry. Neurological:  Alert and oriented to person, place, year, unchanged.   Motor: Grossly 4-/5 throughout, unchanged Psych: Confused.  Assessment/Plan: 1. Functional deficits secondary to hypoxic encephalopathy and debility which require 3+ hours per day of interdisciplinary therapy in a comprehensive inpatient rehab setting.  Physiatrist is providing close team supervision and 24 hour management of active medical problems listed below.  Physiatrist and rehab team continue to assess barriers to discharge/monitor patient progress toward functional and medical goals  Care Tool:  Bathing    Body parts bathed by patient: Right arm, Left arm, Chest, Abdomen, Right upper leg, Left upper leg, Right  lower leg, Left lower leg, Face, Front perineal area, Buttocks   Body parts bathed by helper: Front perineal area, Buttocks     Bathing assist Assist Level: Minimal Assistance - Patient > 75%     Upper Body Dressing/Undressing Upper body dressing   What is the patient wearing?: Pull over shirt    Upper body assist Assist Level: Supervision/Verbal cueing    Lower Body Dressing/Undressing Lower body dressing      What is the patient wearing?: Pants, Incontinence brief     Lower body assist Assist for lower body dressing: Moderate Assistance - Patient 50 - 74%     Toileting Toileting Toileting Activity did not occur Landscape architect and hygiene only): (incontinent care)  Toileting assist Assist for toileting: Moderate Assistance - Patient 50 - 74%     Transfers Chair/bed transfer  Transfers assist  Chair/bed transfer activity did not occur: Safety/medical concerns  Chair/bed transfer assist level: Contact Guard/Touching assist     Locomotion Ambulation   Ambulation assist   Ambulation activity did not occur: Safety/medical concerns  Assist level: Contact Guard/Touching assist Assistive device: Walker-rolling Max distance: 45   Walk 10 feet activity   Assist  Walk 10 feet activity did not occur: Safety/medical concerns  Assist level: Contact Guard/Touching assist Assistive device: Walker-rolling   Walk 50 feet activity   Assist Walk 50 feet with 2 turns activity did not occur: Safety/medical concerns         Walk 150 feet activity   Assist Walk 150 feet activity did not occur: Safety/medical concerns         Walk 10  feet on uneven surface  activity   Assist Walk 10 feet on uneven surfaces activity did not occur: Safety/medical concerns         Wheelchair     Assist Will patient use wheelchair at discharge?: Yes Type of Wheelchair: Manual    Wheelchair assist level: Supervision/Verbal cueing Max wheelchair distance:  146ft    Wheelchair 50 feet with 2 turns activity    Assist    Wheelchair 50 feet with 2 turns activity did not occur: Safety/medical concerns   Assist Level: Supervision/Verbal cueing   Wheelchair 150 feet activity     Assist Wheelchair 150 feet activity did not occur: Safety/medical concerns         Medical Problem List and Plan: 1.  Encephalopathy/debility secondary to  Acute hypoxic respiratory failure/RUL pneumonia complicated by history of COPD oxygen dependent. Continue nebulizers as directed  Continue CIR 2.  DVT Prophylaxis/Anticoagulation: Lovenox. Monitor for any bleeding episodes 3. Pain Management:  Tylenol as needed 4. Mood/delirium:  Provide emotional support 5. Neuropsych: This patient is  not capable of making decisions on his own behalf. 6. Skin/Wound Care:   Added Diflucan 100 mg p.o. twice daily x3 (completed on 1/21) days for inguinal rash 7. Fluids/Electrolytes/Nutrition:  Routine in and out's with follow-up chemistries 8. Dysphagia.Cortrak tube: Swallow study 05/03/2018 dysphagia #1 honey liquids.  DCed NG  IVF nightly 9. ID/Strep Pneumo Bacteremia.  Completed ceftriaxone on 05/05/2018  10. CAD with stenting. Plavix. 11. Hypertension. Clonidine 0.1 mg daily, Toprol 50 mg daily  Labile on 1/23 12. Diabetes mellitus with peripheral neuropathy. Levemir BID. Checking blood sugars before meals and at bedtime. Patient on Glucophage 500 mg daily prior to admission             Reduced Levemir to 10 units twice daily as we convert to orals alone, DC'd on 1/20  Glucophage to 500 mg daily started on 1/22  Labile on 1/23 13. History of gout. Monitor for any flareups 14. Constipation. Laxative assistance.  15.  Acute blood loss anemia  Hemoglobin 11.4 on 1/20  Continue to monitor 16.  Hypoalbuminemia  Supplement initiated 17.  Sleep disturbance  Seroquel DC'd  Melatonin started on 1/20  Ambien started on 1/21, increased on 1/23  LOS: 6 days A FACE  TO FACE EVALUATION WAS PERFORMED  Nayan Proch Lorie Phenix 05/09/2018, 10:04 AM

## 2018-05-09 NOTE — Progress Notes (Signed)
Physical Therapy Session Note  Patient Details  Name: Paul [name] MRN: [id number] Date of Birth: [birth year]  Today's Date: 05/09/2018 PT Individual Time: 1410-1510 PT Individual Time Calculation (min): 60 min   Short Term Goals: Week 1:  PT Short Term Goal 1 (Week 1): Pt will complete bed mobility with min assist.  PT Short Term Goal 2 (Week 1): Pt will ambulate 25 ft with LRAD & mod assist. PT Short Term Goal 3 (Week 1): Pt will propel w/c 50 ft without need for rest break for cardiopulmonary endurance training.  PT Short Term Goal 4 (Week 1): Pt will initiate stair training.   Skilled Therapeutic Interventions/Progress Updates:   Pt received in bed and agreeable to tx. Pt performs lying>sitting EOB with min assist and head of bed slightly elevated. Pt sits<>stands with CGA and ambulates with RW to w/c across room. Pt performs w/c mobility 100 ft + 25 ft with supervision. Pt completes Berg Balance Test with score of 18/56 indicating a high fall risk. Pt sits<>stands multiple times throughout [employer] with supervision>CGA as pt became more fatigued. Pt was educated on results of test and pt voices understanding. Pt ambulates with RW 45 ft +45 ft with CGA and verbal cuing to keep RW close to body. Pt sits EOB>lying down with supervision. Pt left lying in bed with bed alarm on and nurse present to take vitals and all needs in reach.   Pt c/o L buttocks pain while sitting in w/c, therapist switches w/c cushion to standard cushion with pillow on top. Pt states that it feels more comfortable. Pt also c/o SOB multiple times throughout session and rest breaks provided.   Therapy Documentation Precautions:  Precautions Precautions: Fall Precaution Comments: Supplemental 02 Restrictions Weight Bearing Restrictions: No Balance: Standardized Balance Assessment Standardized Balance Assessment: Berg Balance Test Berg Balance Test Sit to Stand: Able to stand  independently using  hands Standing Unsupported: Able to stand 30 seconds unsupported Sitting with Back Unsupported but Feet Supported on Floor or Stool: Able to sit safely and securely 2 minutes Stand to Sit: Uses backs of legs against chair to control descent Transfers: Able to transfer with verbal cueing and /or supervision Standing Unsupported with Eyes Closed: Needs help to keep from falling Standing Ubsupported with Feet Together: Needs help to attain position but able to stand for 30 seconds with feet together From Standing, Reach Forward with Outstretched Arm: Reaches forward but needs supervision From Standing Position, Pick up Object from Floor: Able to pick up shoe, needs supervision From Standing Position, Turn to Look Behind Over each Shoulder: Needs assist to keep from losing balance and falling Turn 360 Degrees: Needs assistance while turning Standing Unsupported, Alternately Place Feet on Step/Stool: Needs assistance to keep from falling or unable to try Standing Unsupported, One Foot in Front: Loses balance while stepping or standing Standing on One Leg: Unable to try or needs assist to prevent fall Total Score: 18   Therapy/Group: Individual Therapy  Coral Spikes 05/09/2018, 3:33 PM

## 2018-05-09 NOTE — Progress Notes (Signed)
Occupational Therapy Weekly Progress Note  Patient Details  Name: Paul [name] MRN: [id number] Date of Birth: April 13, 1951  Beginning of progress report period: May 04, 2018 End of progress report period: May 09, 2018  Today's Date: 05/09/2018 OT Individual Time: 3646-8032 OT Individual Time Calculation (min): 59 min    Patient has met 2 of 3 short term goals.  Paul [name] is making steady progress with OT at this time.  He has progressed to min assist level for toilet transfers and LB selfcare.  Supervision level is achieved for UB selfcare from supported sitting position.  He still demonstrates decreased overall endurance, only tolerating standing for short periods of time during ADL of less than one minute.  Oxygen sats are still around 88-92% on room air with activity with 2 Ls supplemental O2 needed to keep them up in the mid 90's.  Feel he is doing well and is on target for supervision level LTGs.  Recommend continued OT until discharge expected 2/5 with supervision.    Patient continues to demonstrate the following deficits: muscle weakness and decreased standing balance, decreased postural control and decreased balance strategies and therefore will continue to benefit from skilled OT intervention to enhance overall performance with BADL and Reduce care partner burden.  Patient progressing toward long term goals..  Continue plan of care.  OT Short Term Goals Week 2:  OT Short Term Goal 1 (Week 2): Pt will complete LB bathing sit to stand with close supervision. OT Short Term Goal 2 (Week 2): Pt will complete LB dressing with supervision sit to stand.   OT Short Term Goal 3 (Week 2): Pt will maintain standing at the sink for 2 mins or greater to complete grooming tasks.   OT Short Term Goal 4 (Week 2): Pt will perform toilet transfer to the 3:1 with RW and close supervision.  Skilled Therapeutic Interventions/Progress Updates:    Pt completed bathing and dressing during session  after completion of his breakfast and taking his morning medications.  He was able to transfer from the recliner to the wheelchair for work at the sink with min assist and use of the RW.  He completed LB bathing and dressing with min assist, needing one rest break to complete all of his peri washing and washing his buttocks.  Oxygen sats were 92% on room air after standing with dyspnea at 3/4.  Pt completed all UB bathing and dressing with supervision as well.  Finished session with completion of pt combing his hair.  Finished session with transfer to the recliner to rest with overall min assist stand pivot.  Chair alarm in place with call button and phone in reach and safety alarm belt in place.    Therapy Documentation Precautions:  Precautions Precautions: Fall Precaution Comments: Supplemental 02 Restrictions Weight Bearing Restrictions: No   Vital Signs: Oxygen Therapy SpO2: 98 % O2 Device: Nasal Cannula O2 Flow Rate (L/min): 2 L/min Pain: Pain Assessment Pain Scale: Faces Faces Pain Scale: Hurts a little bit Pain Type: Acute pain Pain Location: Buttocks Pain Orientation: Left Pain Descriptors / Indicators: Discomfort ADL: See Care Tool Section for some ADL details  Therapy/Group: Individual Therapy  Ambika Zettlemoyer OTR/L 05/09/2018, 12:16 PM

## 2018-05-09 NOTE — Patient Care Conference (Signed)
Inpatient RehabilitationTeam Conference and Plan of Care Update Date: 05/08/2018   Time: 2:30 PM    Patient Name: Paul [name]      Medical Record Number: [id number]  Date of Birth: 11-08-1950 Sex: Male         Room/Bed: 4M05C/4M05C-01 Payor Info: Payor: MEDICARE / Plan: MEDICARE PART A AND B / Product Type: *No Product type* /    Admitting Diagnosis: Enchephalopathy  Admit Date/Time:  05/03/2018  5:08 PM Admission Comments: No comment available   Primary Diagnosis:  <principal problem not specified> Principal Problem: <principal problem not specified>  Patient Active Problem List   Diagnosis Date Noted  . Labile blood glucose   . Type 2 diabetes mellitus with peripheral neuropathy (HCC)   . Labile blood pressure   . Sleep disturbance   . Hypoalbuminemia due to protein-calorie malnutrition ([place])   . Poorly controlled type 2 diabetes mellitus with peripheral neuropathy ([place])   . Dysphagia   . Candidiasis   . Acute encephalopathy 05/03/2018  . Pressure injury of skin 04/30/2018  . Coronary artery disease involving native coronary artery of native heart without angina pectoris   . Tobacco abuse   . Hypertension   . Acute blood loss anemia   . Bacteremia   . Respiratory failure ([place])   . CAP (community acquired pneumonia) 04/21/2018  . Corneal irritation of both eyes 10/31/2017  . Allergic rhinitis due to animal hair and dander 10/31/2017  . OSA on CPAP 10/10/2016  . OSA and COPD overlap syndrome ([place]) 03/02/2016  . Bronchitis, chronic obstructive, with exacerbation ([place]) 03/02/2016  . Hypoxemia 03/02/2016  . Dependence on supplemental oxygen 03/02/2016  . CAD, multiple vessel 03/02/2016  . Gastroesophageal reflux disease 03/02/2016  . HTN (hypertension), malignant 03/02/2016  . Cigarette smoker 02/22/2014  . DOE (dyspnea on exertion) 01/20/2014  . Obesity (BMI 30.0-34.9)   . Essential hypertension 11/24/2011  . Hyperlipidemia with target LDL less than 70 11/24/2011  . OSA  (obstructive sleep apnea), uses oxygen at home did not tolerate cpap 11/24/2011  . COPD GOLD I  11/24/2011  . CAD S/P percutaneous coronary angioplasty -- D1, Xience Xpedition DES 2.5 mm x 33 mm 11/24/2011    Expected Discharge Date: Expected Discharge Date: 05/22/18  Team Members Present: Physician leading conference: Dr. Delice Lesch Social Worker Present: Lennart Pall, LCSW Nurse Present: Blair Heys, RN PT Present: Barrie Folk, PT OT Present: Clyda Greener, OT SLP Present: Windell Moulding, SLP     Current Status/Progress Goal Weekly Team Focus  Medical   Encephalopathy/debility secondary to  Acute hypoxic respiratory failure/RUL pneumonia complicated by history of COPD oxygen dependent.   Improve mobility, sleep, DM/HTN, cognition  See above   Bowel/Bladder   Continent/incontinent of bowel/bladder. LBM 05/07/18  To be continent of bowel/bladder with min assist  assist with bowel/bladder needs PRN   Swallow/Nutrition/ Hydration   advanced to dys 2 textures per trial meal tray today  supervision   toleration of advanced diet    ADL's   Currently supervision for UB selfcare with min assist for LB bathing and mod assist for LB dressing.  Min assist for stand pivot transfers with use of the RW and short distance mobility.  Weakness in all extremities with decreased standing endurance of less than 2 mins.  supervision overall  selfcare retraining, transfer training, balance retraining, neuromuscular re-education, DME/AE education, pt/family education   Mobility   min assist overal for bed mobility, transfers and gait with RW for short distances. pt significantly  limited by fatigue, SOB, and chest tightness   supervision assist for all mobility including rolling R and L sit<>supine, sit<>stand, stand pivot and ambulation with LRAD up to 162ft. CGA for stair managemn to access home    improved endurance, safety with gait training and improved independence with transfers.    Communication              Safety/Cognition/ Behavioral Observations  mod-max assist,   min assist   work on basic  problem solving, memory, attention, and awareness    Pain   Complained of leg pain, tylenol PRN on-board  <2  Assess and treat pain q shift and as needed   Skin   MASD to the groin with bareer cream  maintain skin integrity with min assist  Assess skin q shift and as needed    Rehab Goals Patient on target to meet rehab goals: Yes *See Care Plan and progress notes for long and short-term goals.     Barriers to Discharge  Current Status/Progress Possible Resolutions Date Resolved   Physician    Medical stability;Behavior     See above  Therapies, optimize HTN/DM, sleep meds      Nursing                  PT                    OT                  SLP                SW                Discharge Planning/Teaching Needs:  Pt to d/c home and family arranging 24/7 support vs. SNF  Teaching to be planned closer to d/c.   Team Discussion:  NG tube out and IVF at night.  Continent days with occ incont at night.  On Dys 2, honey.  Groin rash improving.  Easily fatigued.  Min assist overall with mobility and ADLs.  Needs increased time for SDLs.  Having some knee pain.  All goals supervision.  Cognition can vary.  O2 sats good overall.  Revisions to Treatment Plan:  NA    Continued Need for Acute Rehabilitation Level of Care: The patient requires daily medical management by a physician with specialized training in physical medicine and rehabilitation for the following conditions: Daily direction of a multidisciplinary physical rehabilitation program to ensure safe treatment while eliciting the highest outcome that is of practical value to the patient.: Yes Daily medical management of patient stability for increased activity during participation in an intensive rehabilitation regime.: Yes Daily analysis of laboratory values and/or radiology reports with any subsequent need for medication  adjustment of medical intervention for : Neurological problems;Blood pressure problems;Diabetes problems;Mood/behavior problems   I attest that I was present, lead the team conference, and concur with the assessment and plan of the team.   Murrell Dome 05/09/2018, 3:22 PM

## 2018-05-09 NOTE — Progress Notes (Signed)
Pt had requested a PRN breathing treatment earlier stating he couldn't breath well.  RT assessed patient and talked with him about his current breathing.  Pt states his "nose is stuffed up and he can't get comfortable in the bed".  His breathing looks regular and unlabored.  Pt states other than his nose, his breathing feels fine.  He is CTA bilaterally.  His [place] is on a water bottle to give humidity.  RT suggested to patient and RN that he may benefit from a nasal spray.

## 2018-05-09 NOTE — Progress Notes (Signed)
Speech Language Pathology Daily Session Note  Patient Details  Name: Paul [name] MRN: [id number] Date of Birth: 1950-08-12  Today's Date: 05/09/2018 SLP Individual Time: 1025-1105 SLP Individual Time Calculation (min): 40 min  Short Term Goals: Week 1: SLP Short Term Goal 1 (Week 1): Pt will consumed dys 1 textures and honey thick liquids with mod assist for use of swallowing precautions and minimal overt s/s of aspiration.  SLP Short Term Goal 2 (Week 1): Pt will utilize external aids to reorient to place, date, and situation with mod assist multimodal cues.   SLP Short Term Goal 3 (Week 1): Pt will sustain his attention to a basic, functional task for ~10 minutes with min cues for redirection.   SLP Short Term Goal 4 (Week 1): Pt will complete basic, familiar tasks with mod assist for functional problem solving.  SLP Short Term Goal 5 (Week 1): Pt will recall daily information with mod assist multimodal cues for use of external aids.   SLP Short Term Goal 6 (Week 1): Pt will return demonstration of at least 2 safety precautions with mod assist multimodal cues.    Skilled Therapeutic Interventions:  Pt was seen for skilled ST targeting cognitive and swallowing goals.  SLP facilitated the session with trials of thin liquids to continue working toward diet progression.  Pt demonstrated immediate cough on first trial of thin liquids but no other overt s/s of aspiration with consecutive sips.  I would suggest additional trials of thin liquids at next therapy appointment to address readiness for the water protocol in preparation for repeat objective assessment.  SLP also facilitated the session with a novel card game to address problem solving goals.   Pt was able to plan and execute a problem solving strategy within task with min assist verbal cues.  Subjectively, pt appears much clearer today in comparison to yesterday's therapy session.  Daughter was present and brought pt a Cheerwine, asking if pt  could have it to drink.  SLP provided skilled education regarding rationale for thickened liquids and demonstrated how to thicken liquids to the appropriate viscosity.  Pt was left in bed with bed alarm set and call bell within reach.  Continue per current plan of care.    Pain Pain Assessment Pain Scale: 0-10 Pain Score: 0-No pain Pain Type: Acute pain Pain Location: Buttocks Pain Orientation: Right;Left Pain Descriptors / Indicators: Discomfort Pain Onset: On-going Pain Intervention(s): Repositioned  Therapy/Group: Individual Therapy  Malynn Lucy, Selinda Orion 05/09/2018, 1:37 PM

## 2018-05-10 ENCOUNTER — Inpatient Hospital Stay (HOSPITAL_COMMUNITY): Payer: Medicare Other | Admitting: Physical Therapy

## 2018-05-10 ENCOUNTER — Inpatient Hospital Stay (HOSPITAL_COMMUNITY): Payer: Medicare Other | Admitting: Occupational Therapy

## 2018-05-10 ENCOUNTER — Inpatient Hospital Stay (HOSPITAL_COMMUNITY): Payer: Medicare Other | Admitting: Speech Pathology

## 2018-05-10 LAB — GLUCOSE, CAPILLARY
Glucose-Capillary: 126 mg/dL — ABNORMAL HIGH (ref 70–99)
Glucose-Capillary: 144 mg/dL — ABNORMAL HIGH (ref 70–99)
Glucose-Capillary: 147 mg/dL — ABNORMAL HIGH (ref 70–99)
Glucose-Capillary: 156 mg/dL — ABNORMAL HIGH (ref 70–99)

## 2018-05-10 NOTE — Progress Notes (Signed)
[facility] PHYSICAL MEDICINE & REHABILITATION PROGRESS NOTE   Subjective/Complaints: Patient seen laying in bed this morning.  He states he slept better overnight.  His blood work with therapies.  He states he feels better this morning.  ROS: Denies CP, shortness of breath, nausea, vomiting, diarrhea.   Objective:   No results found. No results for input(s): WBC, HGB, HCT, PLT in the last 72 hours. No results for input(s): NA, K, CL, CO2, GLUCOSE, BUN, CREATININE, CALCIUM in the last 72 hours.  Intake/Output Summary (Last 24 hours) at 05/10/2018 0929 Last data filed at 05/10/2018 0751 Gross per 24 hour  Intake 3242.5 ml  Output 1125 ml  Net 2117.5 ml     Physical Exam: Vital Signs Blood pressure (!) 158/88, pulse (!) 107, temperature 97.7 F (36.5 C), temperature source Oral, resp. rate 19, height 5\' 6"  (1.676 m), weight 73.5 kg, SpO2 96 %. Constitutional: No distress . Vital signs reviewed. HENT: Normocephalic.  Atraumatic.  Eyes: EOMI. No discharge. Cardiovascular: RRR.  No JVD. Respiratory: Normal effort.  Clear.  + . GI: BS +.  Nondistended. Musc: No edema or tenderness in extremities. Skin: Intact.  Warm and dry. Neurological:  Alert and oriented to person, place, year, unchanged.   Motor: Grossly 4-/5 throughout, slowly improving Psych: Confused.  Assessment/Plan: 1. Functional deficits secondary to hypoxic encephalopathy and debility which require 3+ hours per day of interdisciplinary therapy in a comprehensive inpatient rehab setting.  Physiatrist is providing close team supervision and 24 hour management of active medical problems listed below.  Physiatrist and rehab team continue to assess barriers to discharge/monitor patient progress toward functional and medical goals  Care Tool:  Bathing    Body parts bathed by patient: Right arm, Left arm, Chest, Abdomen, Right upper leg, Left upper leg, Right lower leg, Left lower leg, Face, Front perineal area,  Buttocks   Body parts bathed by helper: Front perineal area, Buttocks     Bathing assist Assist Level: Minimal Assistance - Patient > 75%     Upper Body Dressing/Undressing Upper body dressing   What is the patient wearing?: Pull over shirt    Upper body assist Assist Level: Supervision/Verbal cueing    Lower Body Dressing/Undressing Lower body dressing      What is the patient wearing?: Pants, Incontinence brief     Lower body assist Assist for lower body dressing: Moderate Assistance - Patient 50 - 74%     Toileting Toileting Toileting Activity did not occur Landscape architect and hygiene only): (incontinent care)  Toileting assist Assist for toileting: Moderate Assistance - Patient 50 - 74%     Transfers Chair/bed transfer  Transfers assist  Chair/bed transfer activity did not occur: Safety/medical concerns  Chair/bed transfer assist level: Supervision/Verbal cueing     Locomotion Ambulation   Ambulation assist   Ambulation activity did not occur: Safety/medical concerns  Assist level: Contact Guard/Touching assist Assistive device: Walker-rolling Max distance: 45 ft   Walk 10 feet activity   Assist  Walk 10 feet activity did not occur: Safety/medical concerns  Assist level: Contact Guard/Touching assist Assistive device: Walker-rolling   Walk 50 feet activity   Assist Walk 50 feet with 2 turns activity did not occur: Safety/medical concerns         Walk 150 feet activity   Assist Walk 150 feet activity did not occur: Safety/medical concerns         Walk 10 feet on uneven surface  activity   Assist Walk 10 feet  on uneven surfaces activity did not occur: Safety/medical concerns         Wheelchair     Assist Will patient use wheelchair at discharge?: Yes Type of Wheelchair: Manual    Wheelchair assist level: Supervision/Verbal cueing Max wheelchair distance: 100 ft     Wheelchair 50 feet with 2 turns  activity    Assist    Wheelchair 50 feet with 2 turns activity did not occur: Safety/medical concerns   Assist Level: Supervision/Verbal cueing   Wheelchair 150 feet activity     Assist Wheelchair 150 feet activity did not occur: Safety/medical concerns         Medical Problem List and Plan: 1.  Encephalopathy/debility secondary to  Acute hypoxic respiratory failure/RUL pneumonia complicated by history of COPD oxygen dependent. Continue nebulizers as directed  Continue CIR 2.  DVT Prophylaxis/Anticoagulation: Lovenox. Monitor for any bleeding episodes 3. Pain Management:  Tylenol as needed 4. Mood/delirium:  Provide emotional support 5. Neuropsych: This patient is  not capable of making decisions on his own behalf. 6. Skin/Wound Care:   Added Diflucan 100 mg p.o. twice daily x3 (completed on 1/21) days for inguinal rash 7. Fluids/Electrolytes/Nutrition:  Routine in and out's with follow-up chemistries 8. Dysphagia.Cortrak tube: Swallow study 05/03/2018 dysphagia #1 honey liquids.  DCed NG  IVF nightly 9. ID/Strep Pneumo Bacteremia.  Completed ceftriaxone on 05/05/2018  10. CAD with stenting. Plavix. 11. Hypertension. Clonidine 0.1 mg daily, Toprol 50 mg daily  ?  Trending up on 1/24, will consider medication adjustments if persistently elevated 12. Diabetes mellitus with peripheral neuropathy. Levemir BID. Checking blood sugars before meals and at bedtime. Patient on Glucophage 500 mg daily prior to admission             Reduced Levemir to 10 units twice daily as we convert to orals alone, DC'd on 1/20  Glucophage to 500 mg daily started on 1/22  Labile, but?  Improving on 1/24 13. History of gout. Monitor for any flareups 14. Constipation. Laxative assistance.  15.  Acute blood loss anemia  Hemoglobin 11.4 on 1/20  Continue to monitor 16.  Hypoalbuminemia  Supplement initiated 17.  Sleep disturbance  Seroquel DC'd  Melatonin started on 1/20  Ambien started on  1/21, increased on 1/23  LOS: 7 days A FACE TO FACE EVALUATION WAS PERFORMED  Meagon Duskin Lorie Phenix 05/10/2018, 9:29 AM

## 2018-05-10 NOTE — Progress Notes (Signed)
Social Work Patient ID: Paul [name], male   DOB: Jun 12, 1950, 68 y.o.   MRN: [id number]   Have reviewed team conference with pt and son-in-law.  Both aware and agreeable with targeted d/c date of 2/5 and supervision goals.  Son-in-law reports that they are coordinating the 24/7 care.  Will continue to follow.  Paul Demond, LCSW

## 2018-05-10 NOTE — Progress Notes (Signed)
Occupational Therapy Session Note  Patient Details  Name: Paul [name] MRN: [id number] Date of Birth: 06/18/1950  Today's Date: 05/10/2018 OT Individual Time: [phone] OT Individual Time Calculation (min): 71 min    Short Term Goals: Week 2:  OT Short Term Goal 1 (Week 2): Pt will complete LB bathing sit to stand with close supervision. OT Short Term Goal 2 (Week 2): Pt will complete LB dressing with supervision sit to stand.   OT Short Term Goal 3 (Week 2): Pt will maintain standing at the sink for 2 mins or greater to complete grooming tasks.   OT Short Term Goal 4 (Week 2): Pt will perform toilet transfer to the 3:1 with RW and close supervision.  Skilled Therapeutic Interventions/Progress Updates:    Patient in bed upon arrival, denies pain and ready to get up for therapy.  Bed mobility with min A, SPT to/from w/c, bed and short distance ambulation with RW CG A.  O2 2L t/o session and IV running.  Washing w/c level at sink with set up/supervision (min A for drying)  UB dressing with set up, grooming set up seated in w/c, LB dressing min/mod A, slipper socks with set up/supervision.  Standing for brief periods during session.  Fatigue limiting patient today - slow to complete all tasks.  Nursing and respiratory aware.  Patient returned to bed at close of session with respiratory therapist present to provide treatment.  Bed alarm set and call bell in reach.    Therapy Documentation Precautions:  Precautions Precautions: Fall Precaution Comments: Supplemental 02 Restrictions Weight Bearing Restrictions: No General:   Vital Signs: Therapy Vitals Pulse Rate: (!) 107 Resp: 19 Patient Position (if appropriate): Sitting Oxygen Therapy SpO2: 96 % O2 Device: Nasal Cannula O2 Flow Rate (L/min): 2 L/min Pain: Pain Assessment Pain Scale: 0-10 Pain Score: 0-No pain   Other Treatments:     Therapy/Group: Individual Therapy  Carlos Levering 05/10/2018, 9:10 AM

## 2018-05-10 NOTE — Plan of Care (Signed)
  Problem: RH BOWEL ELIMINATION Goal: RH STG MANAGE BOWEL W/MEDICATION W/ASSISTANCE Description STG Manage Bowel with Medication with Assistance. min  Outcome: Progressing   Problem: RH BLADDER ELIMINATION Goal: RH STG MANAGE BLADDER WITH ASSISTANCE Description STG Manage Bladder With Assistance. min  Outcome: Progressing   Problem: RH SKIN INTEGRITY Goal: RH STG SKIN FREE OF INFECTION/BREAKDOWN Description Free of further breakdown. Free of infection while on rehab with mod assist  Outcome: Progressing Goal: RH STG MAINTAIN SKIN INTEGRITY WITH ASSISTANCE Description STG Maintain Skin Integrity With Assistance. Mod  Outcome: Progressing   Problem: RH SAFETY Goal: RH STG ADHERE TO SAFETY PRECAUTIONS W/ASSISTANCE/DEVICE Description STG Adhere to Safety Precautions With Assistance/Device. mod  Outcome: Progressing

## 2018-05-10 NOTE — Progress Notes (Signed)
Water protocol done at 1430. Pt able to complete 8oz of water. Oral care done prior to water protocol. Pt tolerated well. No signs of aspiration noted. continue plan of care.   Paul [name] W Hatsumi Steinhart

## 2018-05-10 NOTE — Progress Notes (Signed)
Speech Language Pathology Weekly Progress and Session Note  Patient Details  Name: Paul [name] MRN: [id number] Date of Birth: September 24, 1950  Beginning of progress report period: May 03, 2018 End of progress report period: May 10, 2018  Today's Date: 05/10/2018 SLP Individual Time: [id number] SLP Individual Time Calculation (min): 45 min  Short Term Goals: Week 1: SLP Short Term Goal 1 (Week 1): Pt will consumed dys 1 textures and honey thick liquids with mod assist for use of swallowing precautions and minimal overt s/s of aspiration.  SLP Short Term Goal 1 - Progress (Week 1): Met SLP Short Term Goal 2 (Week 1): Pt will utilize external aids to reorient to place, date, and situation with mod assist multimodal cues.   SLP Short Term Goal 2 - Progress (Week 1): Met SLP Short Term Goal 3 (Week 1): Pt will sustain his attention to a basic, functional task for ~10 minutes with min cues for redirection.   SLP Short Term Goal 3 - Progress (Week 1): Progressing toward goal SLP Short Term Goal 4 (Week 1): Pt will complete basic, familiar tasks with mod assist for functional problem solving.  SLP Short Term Goal 4 - Progress (Week 1): Met SLP Short Term Goal 5 (Week 1): Pt will recall daily information with mod assist multimodal cues for use of external aids.   SLP Short Term Goal 5 - Progress (Week 1): Progressing toward goal SLP Short Term Goal 6 (Week 1): Pt will return demonstration of at least 2 safety precautions with mod assist multimodal cues.   SLP Short Term Goal 6 - Progress (Week 1): Other (comment)(not targeted during this reporting period)    New Short Term Goals: Week 2: SLP Short Term Goal 1 (Week 2): Pt will complete instrumental swallow exam to assess potential for liquid advancement. SLP Short Term Goal 2 (Week 2): Pt will consume least restrictive diet with min assist for use of swallowing precautions and minimal overt s/s of aspiration.  SLP Short Term Goal 3 (Week 2): Pt  will sustain his attention to a basic, functional task for ~10 minutes with min cues for redirection.   SLP Short Term Goal 4 (Week 2): Pt will complete basic, familiar tasks with min assist for functional problem solving.  SLP Short Term Goal 5 (Week 2): Pt will return demonstration of at least 2 safety precautions with mod assist multimodal cues.   SLP Short Term Goal 6 (Week 2): Pt will recall daily information with mod assist multimodal cues for use of external aids.    Weekly Progress Updates: Pt has made functional gains this reporting period and has met 3 out of 6 short term goals.  Pt is currently mod assist for tasks due to impaired cognition and severe oropharyngeal dysphagia.  Pt has demonstrated improved orientation and mastication of solid POs.  Pt advanced diet and is now consuming dysphagia 2 texture with min-mod cues for use of swallowing precautions.  Pt and family education is ongoing.  Pt would continue to benefit from skilled ST while inpatient in order to maximize functional independence and reduce burden of care prior to discharge.  Anticipate that pt will need 24/7 supervision at discharge in addition to [place] follow up at next level of care.     Intensity: Minumum of 1-2 x/day, 30 to 90 minutes Frequency: 3 to 5 out of 7 days Duration/Length of Stay: 14-21 days  Treatment/Interventions: Cognitive remediation/compensation;Cueing hierarchy;Dysphagia/aspiration precaution training;Functional tasks;Patient/family education;Internal/external aids;Environmental controls   Daily Session  Skilled  Therapeutic Interventions: Pt was seen for skilled ST focused on dysphagia goals. Pt was sleeping in bed upon arrival, however he was easily aroused and agreeable to participate. SLP repositioned pt to sitting upright at the edge of bed to maximize safety during PO consumption. Following thorough oral care, pt consuming trials of thin H2O with 2 instances of reflexive cough. Of note, pt also  presented with intermittent dry cough at baseline. Pt demonstrated use of safe swallow strategies with min-mod assist verbal cues from the clinician. SLP initiated conservative water protocol for pt (max 10 sips of H2O only following oral care and at least 30 minutes after any meal/snack); RN made aware and signs posted. SLP also trialed session without use of supplemental O2. Pt's SpO2 remained within functional limits for the duration of our session, however pt verbalized preference to use O2.     General    Pain Pain Assessment Pain Scale: 0-10 Pain Score: 0-No pain  Therapy/Group: Individual Therapy  Jettie Booze, Student SLP   Jettie Booze 05/10/2018, 3:35 PM

## 2018-05-10 NOTE — Progress Notes (Signed)
Physical Therapy Session Note  Patient Details  Name: Paul [name] MRN: [id number] Date of Birth: 1951-01-27  Today's Date: 05/10/2018 PT Individual Time:1420-1530   70 min   Short Term Goals: Week 1:  PT Short Term Goal 1 (Week 1): Pt will complete bed mobility with min assist.  PT Short Term Goal 2 (Week 1): Pt will ambulate 25 ft with LRAD & mod assist. PT Short Term Goal 3 (Week 1): Pt will propel w/c 50 ft without need for rest break for cardiopulmonary endurance training.  PT Short Term Goal 4 (Week 1): Pt will initiate stair training.   Skilled Therapeutic Interventions/Progress Updates:   Pt received sitting EOB and agreeable to PT. Stand pivot trasnfer to [facility] with RW and Supervision assist  from PT for safety.  WC mobility to rehab gym with multiple short seated rest breaks x 183f. 5x STS completed x 3: 35sec, 37sec, 32sec supervision assist from PT PT instructed pt in gait training with RW, 46f+ 5085fith prolonged rest breaks between bouts. Stand pivot to and from mat table with RW and supervision assist x 2 . PT instructed pt in supine therex: SLR, SAQ, bridge, hip abduction reciprocal marches. Each completed x 10 BLE with moderate cues for full ROM and proper speed. Patient returned to room and left sitting EOB with call bell in reach and all needs met.        Therapy Documentation Precautions:  Precautions Precautions: Fall Precaution Comments: Supplemental 02 Restrictions Weight Bearing Restrictions: No Vital Signs: Therapy Vitals Temp: 98.1 F (36.7 C) Temp Source: Oral Pulse Rate: 86 Resp: 16 BP: 137/78 Patient Position (if appropriate): Lying Oxygen Therapy SpO2: 96 % O2 Device: Nasal Cannula O2 Flow Rate (L/min): 3 L/min Pain:   denies at rest. Therapy/Group: Individual Therapy  AusLorie Phenix24/2020, 2:45 PM

## 2018-05-11 ENCOUNTER — Inpatient Hospital Stay (HOSPITAL_COMMUNITY): Payer: Medicare Other | Admitting: Physical Therapy

## 2018-05-11 LAB — GLUCOSE, CAPILLARY
Glucose-Capillary: 137 mg/dL — ABNORMAL HIGH (ref 70–99)
Glucose-Capillary: 153 mg/dL — ABNORMAL HIGH (ref 70–99)
Glucose-Capillary: 151 mg/dL — ABNORMAL HIGH (ref 70–99)
Glucose-Capillary: 134 mg/dL — ABNORMAL HIGH (ref 70–99)

## 2018-05-11 MED ORDER — LIDOCAINE 5 % EX PTCH
1.0000 | MEDICATED_PATCH | CUTANEOUS | Status: DC
  Administered 2018-05-11 – 2018-05-22 (×12): 1 via TRANSDERMAL
  Filled 2018-05-11 (×13): qty 1

## 2018-05-11 NOTE — Progress Notes (Signed)
Paul [name] PHYSICAL MEDICINE & REHABILITATION PROGRESS NOTE   Subjective/Complaints: Paul [name] seen lying in bed Paul morning.  Paul [name] complains of some left-sided hip discomfort.  Paul [name] denies any trauma. Objective:   No results found. No results for input(s): WBC, HGB, HCT, PLT in the last 72 hours. No results for input(s): NA, K, CL, CO2, GLUCOSE, BUN, CREATININE, CALCIUM in the last 72 hours.  Physical Exam: Vital Signs Blood pressure 140/85, pulse 64, temperature (!) 97.4 F (36.3 C), resp. rate 16, height 5\' 6"  (1.676 m), weight 77.7 kg, SpO2 99 %.  Paul [name] is in no acute distress.  HEENT exam atraumatic, normocephalic, extraocular muscles are intact.  Neck is supple without lymphadenopathy or thyromegaly.  Paul [name] is wearing oxygen (nares).  Cardiac exam S1-S2 are regular Abdominal exam active bowel sounds, soft. Extremities no edema.  Paul [name] does have full range of motion of his left hip.  Some discomfort to palpation over the left posterior hip.  There is no rash. Assessment/Plan: 1. Functional deficits secondary to hypoxic encephalopathy and debility    Medical Problem List and Plan: 1.  Encephalopathy/debility secondary to  Acute hypoxic respiratory failure/RUL pneumonia complicated by history of COPD oxygen dependent. Continue nebulizers as directed  Continue CIR 2.  DVT Prophylaxis/Anticoagulation: Lovenox. Monitor for any bleeding episodes 3. Pain Management:  Tylenol as needed 4. Mood/delirium:   5. Neuropsych: Paul [name] is  not capable of making decisions on his own behalf. 6. Skin/Wound Care:   No abnormalities at Paul time. 7. Fluids/Electrolytes/Nutrition:  Basic Metabolic Panel:    Component Value Date/Time   NA 140 05/06/2018 0606   K 4.0 05/06/2018 0606   CL 106 05/06/2018 0606   CO2 25 05/06/2018 0606   BUN 15 05/06/2018 0606   CREATININE 0.74 05/06/2018 0606   GLUCOSE 107 (H) 05/06/2018 0606   CALCIUM 8.2 (L) 05/06/2018 0606    8. Dysphagia.Cortrak tube: Swallow  study 05/03/2018 dysphagia #1 honey liquids.  Continue IV fluids nightly. 9. ID/Strep Pneumo Bacteremia.  Has completed ceftriaxone on May 05, 2018. 10. CAD with stenting. Plavix. 11. Hypertension.  Continue current medications.  Blood pressure 130-140/61-85. 12. Diabetes mellitus with peripheral neuropathy. Levemir BID.  CBG (last 3)  Recent Labs    05/10/18 1705 05/10/18 2107 05/11/18 0706  GLUCAP 147* 156* 137*   Reasonably stable.  May need to increase medications if blood sugars remain above 140. 13. History of gout.  No symptoms. 14. Constipation.  Resolved. 15.  Acute blood loss anemia  Hemoglobin 11.4 on January 20. 16.  Hypoalbuminemia  Supplement initiated 17.  Sleep disturbance  Seroquel DC'd  Melatonin started on 1/20  Ambien started on 1/21, increased on 1/23 18.  New complaint of left hip discomfort.  I will not treat today.  I will see what happens overnight.  If it continues to bother him may consider lidocaine patch. LOS: 8 days A FACE TO FACE EVALUATION WAS PERFORMED  Ples Trudel H Duey Liller 05/11/2018, 10:11 AM

## 2018-05-11 NOTE — Progress Notes (Signed)
Physical Therapy Weekly Progress Note  Patient Details  Name: Paul [name] MRN: [id number] Date of Birth: 1950/11/06  Beginning of progress report period: May 04, 2018 End of progress report period: May 11, 2018  Today's Date: 05/11/2018 PT Individual Time: 0930-1000 PT Individual Time Calculation (min): 30 min   Patient has met 4 of 4 short term goals. Pt making good progress towards long term goals. Pt has noted improvement in strength, endurance, safety, mobility. Pt is currently able to perform bed mobility with mod assist, stand pivot transfers with supervision assist, ambulate with RW up to 73f and propell WC up to 752fwithout a rest break. Cardiovascular endurance and O2 management are this pt's greatest limiting factors at this time.   Patient continues to demonstrate the following deficits muscle weakness and muscle joint tightness, decreased cardiorespiratoy endurance and decreased oxygen support and decreased standing balance and decreased balance strategies and therefore will continue to benefit from skilled PT intervention to increase functional independence with mobility.  Patient progressing toward long term goals..  Continue plan of care.  PT Short Term Goals Week 1:  PT Short Term Goal 1 (Week 1): Pt will complete bed mobility with min assist.  PT Short Term Goal 1 - Progress (Week 1): Met PT Short Term Goal 2 (Week 1): Pt will ambulate 25 ft with LRAD & mod assist. PT Short Term Goal 2 - Progress (Week 1): Met PT Short Term Goal 3 (Week 1): Pt will propel w/c 50 ft without need for rest break for cardiopulmonary endurance training.  PT Short Term Goal 3 - Progress (Week 1): Met PT Short Term Goal 4 (Week 1): Pt will initiate stair training.  PT Short Term Goal 4 - Progress (Week 1): Met Week 2:  PT Short Term Goal 1 (Week 2): Pt will ambulate 7528fith CGA and LRAD  PT Short Term Goal 2 (Week 2): Pt will propell >100dft without rest break to improve  cardiovascular endurance.  PT Short Term Goal 3 (Week 2): Pt will ascend 2 step with UE support and CGA assist  PT Short Term Goal 4 (Week 2): Pt will perform bed mobiltiy with supervision assist   Skilled Therapeutic Interventions/Progress Updates:   Pt received sitting EOB and agreeable to PT. [facility] and [facility]. Gait training with RW 2 x [id number] CGA and min cues for step width and decreased step length. Pt reports RPE at 8/10 following Gait training. Stair management training for up/down 1 step x 4 with min assist from PT, mild hip pain reported by PT. WC mobility x [id number] with only 2 rest breaks and min cues for turning technique. [facility] returned to room and [facility] and all needs met.         Therapy Documentation Precautions:  Precautions Precautions: Fall Precaution Comments: Supplemental 02 Restrictions Weight Bearing Restrictions: No  Vital Signs: Therapy Vitals Temp: (!) 97.4 F (36.3 C) Pulse Rate: 64 Resp: 16 BP: 140/85 Patient Position (if appropriate): Lying Oxygen Therapy SpO2: 99 % O2 Device: Nasal Cannula Pain: Pain Assessment Pain Scale: 0-10 Pain Score: 9  Pain Type: Acute pain Pain Location: Hip Pain Orientation: Left Pain Descriptors / Indicators: Aching Pain Onset: On-going Pain Intervention(s): Medication (See eMAR)   Therapy/Group: Individual Therapy  AustiLorie Phenix/2020, 9:51 AM

## 2018-05-12 ENCOUNTER — Inpatient Hospital Stay (HOSPITAL_COMMUNITY): Payer: Medicare Other | Admitting: Physical Therapy

## 2018-05-12 LAB — GLUCOSE, CAPILLARY
Glucose-Capillary: 125 mg/dL — ABNORMAL HIGH (ref 70–99)
Glucose-Capillary: 152 mg/dL — ABNORMAL HIGH (ref 70–99)
Glucose-Capillary: 150 mg/dL — ABNORMAL HIGH (ref 70–99)
Glucose-Capillary: 139 mg/dL — ABNORMAL HIGH (ref 70–99)

## 2018-05-12 NOTE — Progress Notes (Signed)
Physical Therapy Session Note  Patient Details  Name: Paul [name] MRN: [id number] Date of Birth: 19-Dec-1950  Today's Date: 05/12/2018 PT Individual Time: 1250-1313 PT Individual Time Calculation (min): 23 min   Short Term Goals: Week 2:  PT Short Term Goal 1 (Week 2): Pt will ambulate 58ft with CGA and LRAD  PT Short Term Goal 2 (Week 2): Pt will propell >100dft without rest break to improve cardiovascular endurance.  PT Short Term Goal 3 (Week 2): Pt will ascend 2 step with UE support and CGA assist  PT Short Term Goal 4 (Week 2): Pt will perform bed mobiltiy with supervision assist   Skilled Therapeutic Interventions/Progress Updates:   pt just getting into bed with nursing.  Refuses physical therapy session due to "I'm tired, too tired".  PT consults with nursing who states pt did not sleep at all last night.  Pt also fatigued from participating in w/c level dance group earlier this day.  Pt agreeable to reviewing written HEP to perform when he feels more rested.  Pt's daughter present to be educated on HEP.  Pt given handout of seated and supine HEP for LE strengthening and pt/daughter verbalize understanding.  Pt able to perform all bed mobility with supervision, attempting to position himself to decrease soreness on bottom.  PT provided emotional support to pt who is upset by lack of sleep.  RN aware of refusal and continued inability to sleep. Pt left with alarm set, needs at hand.   Therapy Documentation Precautions:  Precautions Precautions: Fall Precaution Comments: Supplemental 02 Restrictions Weight Bearing Restrictions: No Pain:  no c/o pain   Therapy/Group: Individual Therapy  DONAWERTH,KAREN 05/12/2018, 1:20 PM

## 2018-05-12 NOTE — Progress Notes (Signed)
[facility] PHYSICAL MEDICINE & REHABILITATION PROGRESS NOTE   Subjective/Complaints: Patient is sitting up in bed eating.  He feels well although he continues to complain about left hip discomfort.  He also states that he is not sleeping well.. Objective:    Physical Exam: Vital Signs Blood pressure 130/88, pulse 85, temperature 98.4 F (36.9 C), temperature source Oral, resp. rate 18, height 5\' 6"  (1.676 m), weight 77.7 kg, SpO2 96 %.  Patient is in no acute distress.  HEENT exam atraumatic, normocephalic, extraocular muscles are intact.  Neck is supple without lymphadenopathy or thyromegaly.  He is wearing oxygen (nares).  Cardiac exam S1-S2 are regular Abdominal exam active bowel sounds, soft. Extremities no edema.   Assessment/Plan: 1. Functional deficits secondary to hypoxic encephalopathy and debility    Medical Problem List and Plan: 1.  Encephalopathy/debility secondary to  Acute hypoxic respiratory failure/RUL pneumonia complicated by history of COPD oxygen dependent. Continue nebulizers as directed  Continue CIR 2.  DVT Prophylaxis/Anticoagulation: Lovenox. Monitor for any bleeding episodes 3. Pain Management:  Tylenol as needed 4. Mood/delirium:   5. Neuropsych: This patient is  not capable of making decisions on his own behalf. 6. Skin/Wound Care:   No abnormalities at this time. 7. Fluids/Electrolytes/Nutrition:  Basic Metabolic Panel:    Component Value Date/Time   NA 140 05/06/2018 0606   K 4.0 05/06/2018 0606   CL 106 05/06/2018 0606   CO2 25 05/06/2018 0606   BUN 15 05/06/2018 0606   CREATININE 0.74 05/06/2018 0606   GLUCOSE 107 (H) 05/06/2018 0606   CALCIUM 8.2 (L) 05/06/2018 0606    8. Dysphagia.Cortrak tube: Swallow study 05/03/2018 dysphagia #1 honey liquids.  Continue IV fluids nightly. 9. ID/Strep Pneumo Bacteremia.  Has completed ceftriaxone on May 05, 2018. 10. CAD with stenting. Plavix. 11. Hypertension.  Continue current medications.  Blood  pressure 110-137/79-92. 12. Diabetes mellitus with peripheral neuropathy. Levemir BID.  CBG (last 3)  Recent Labs    05/11/18 1633 05/11/18 2121 05/12/18 0644  GLUCAP 151* 134* 125*   Reasonably stable.  Continue current regimen. 13. History of gout.  No symptoms. 14. Constipation.  Resolved. 15.  Acute blood loss anemia  Hemoglobin 11.4 on January 20. 16.  Hypoalbuminemia  Supplement initiated 17.  Sleep disturbance  Continues to struggle with sleep.  He is on Ambien 10 mg p.o. nightly.  Continue with this therapy for now. 18.  New complaint of left hip discomfort.  Started lidocaine patch yesterday.  He states when the patch is applied he does have some pain relief.  Continue for now. LOS: 9 days A FACE TO FACE EVALUATION WAS PERFORMED  Bruce H Swords 05/12/2018, 8:47 AM

## 2018-05-13 ENCOUNTER — Inpatient Hospital Stay (HOSPITAL_COMMUNITY): Payer: Medicare Other

## 2018-05-13 ENCOUNTER — Inpatient Hospital Stay (HOSPITAL_COMMUNITY): Payer: Medicare Other | Admitting: Occupational Therapy

## 2018-05-13 ENCOUNTER — Inpatient Hospital Stay (HOSPITAL_COMMUNITY): Payer: Medicare Other | Admitting: Speech Pathology

## 2018-05-13 DIAGNOSIS — R9431 Abnormal electrocardiogram [ECG] [EKG]: Secondary | ICD-10-CM

## 2018-05-13 LAB — GLUCOSE, CAPILLARY
Glucose-Capillary: 116 mg/dL — ABNORMAL HIGH (ref 70–99)
Glucose-Capillary: 149 mg/dL — ABNORMAL HIGH (ref 70–99)
Glucose-Capillary: 151 mg/dL — ABNORMAL HIGH (ref 70–99)
Glucose-Capillary: 137 mg/dL — ABNORMAL HIGH (ref 70–99)

## 2018-05-13 MED ORDER — TEMAZEPAM 7.5 MG PO CAPS
7.5000 mg | ORAL_CAPSULE | Freq: Every day | ORAL | Status: DC
  Administered 2018-05-13: 7.5 mg via ORAL
  Filled 2018-05-13: qty 1

## 2018-05-13 MED ORDER — SODIUM CHLORIDE 0.9% FLUSH
10.0000 mL | INTRAVENOUS | Status: DC | PRN
  Administered 2018-05-17: 10 mL
  Filled 2018-05-13: qty 40

## 2018-05-13 NOTE — Progress Notes (Signed)
Occupational Therapy Session Note  Patient Details  Name: Paul [name] MRN: [id number] Date of Birth: [birth year]  Today's Date: 05/13/2018 OT Individual Time: 1600-1630 OT Individual Time Calculation (min): 30 min    Short Term Goals: Week 2:  OT Short Term Goal 1 (Week 2): Pt will complete LB bathing sit to stand with close supervision. OT Short Term Goal 2 (Week 2): Pt will complete LB dressing with supervision sit to stand.   OT Short Term Goal 3 (Week 2): Pt will maintain standing at the sink for 2 mins or greater to complete grooming tasks.   OT Short Term Goal 4 (Week 2): Pt will perform toilet transfer to the 3:1 with RW and close supervision.  Skilled Therapeutic Interventions/Progress Updates:    Session focused on B UE strengthening/endurance for carryover to ADL/IADLs. Pt received supine in bed, agreeable to therapy but requesting to not leave room. Pt sat EOB with no UE support, good sitting balance throughout session. He held 4 lb dumbbells and completed functional reaching exercises in a circuit. Cueing provided for proper form, technique, and to increase intensity. Pt reported soreness in his buttocks and a heat pack was applied for the duration of the session. Skilled monitoring of pulmonary stability throughout session with pt on 2L [place]. All VSS throughout. Pt was returned supine at end of session with bed alarm set and all needs met.   Therapy Documentation Precautions:  Precautions Precautions: Fall Precaution Comments: Supplemental 02 Restrictions Weight Bearing Restrictions: No  Vital Signs: Therapy Vitals Temp: 98.2 F (36.8 C) Temp Source: Oral Pulse Rate: 76 Resp: 16 BP: 128/81 Patient Position (if appropriate): Sitting Oxygen Therapy SpO2: 99 % O2 Device: Room Air Pain: Pain Assessment Pain Scale: 0-10 Pain Score: 3  Pain Type: Acute pain Pain Location: Buttocks Pain Orientation: Right;Left Pain Descriptors / Indicators: Sore Pain Onset:  On-going Pain Intervention(s): Heat applied   Therapy/Group: Individual Therapy  Curtis Sites 05/13/2018, 4:31 PM

## 2018-05-13 NOTE — Progress Notes (Signed)
Occupational Therapy Session Note  Patient Details  Name: Paul [name] MRN: [id number] Date of Birth: July 17, 1950  Today's Date: 05/13/2018 OT Individual Time: 1300-1400 OT Individual Time Calculation (min): 60 min    Short Term Goals: Week 2:  OT Short Term Goal 1 (Week 2): Pt will complete LB bathing sit to stand with close supervision. OT Short Term Goal 2 (Week 2): Pt will complete LB dressing with supervision sit to stand.   OT Short Term Goal 3 (Week 2): Pt will maintain standing at the sink for 2 mins or greater to complete grooming tasks.   OT Short Term Goal 4 (Week 2): Pt will perform toilet transfer to the 3:1 with RW and close supervision.  Skilled Therapeutic Interventions/Progress Updates:    Pt completed bathing and dressing during session.  Min assist to complete functional mobility to the shower with use of the RW.  Oxygen removed during session with O2 sats remaining at 92% on room air or greater. He completed all bathing sit to stand with min guard assist.  He transferred out to the EOB for dressing tasks, also performed at min guard assist level.  Next had pt ambulate to the sink with the RW and stand for 1 min with supervision while combing his hair and then brushing his teeth in sitting.  He then was given ice water per water protocol and positioned beside the bed in the wheelchair with call button and phone in reach.  Therapy Documentation Precautions:  Precautions Precautions: Fall Precaution Comments: Supplemental 02 Restrictions Weight Bearing Restrictions: No  Pain: Pain Assessment Pain Scale: 0-10 Pain Score: 3  Faces Pain Scale: Hurts a little bit Pain Type: Acute pain Pain Location: Buttocks Pain Orientation: Right;Left Pain Descriptors / Indicators: Sore Pain Onset: On-going Pain Intervention(s): Heat applied ADL: See Care Tool Section for details of ADL  Therapy/Group: Individual Therapy  Paymon Rosensteel OTR/L 05/13/2018, 4:37 PM

## 2018-05-13 NOTE — Progress Notes (Signed)
Physical Therapy Session Note  Patient Details  Name: Paul [name] MRN: [id number] Date of Birth: Jan 19, 1951  Today's Date: 05/13/2018 PT Individual Time: 1000-1058 PT Individual Time Calculation (min): 58 min   Short Term Goals: Week 2:  PT Short Term Goal 1 (Week 2): Pt will ambulate 4ft with CGA and LRAD  PT Short Term Goal 2 (Week 2): Pt will propell >100dft without rest break to improve cardiovascular endurance.  PT Short Term Goal 3 (Week 2): Pt will ascend 2 step with UE support and CGA assist  PT Short Term Goal 4 (Week 2): Pt will perform bed mobiltiy with supervision assist   Skilled Therapeutic Interventions/Progress Updates:    With encouragement, due to fatigue and lack of sleep, pt agreeable to participate in therapy session to progress towards d/c. Pt assisted with setup of clothing EOB, required assist with R pant leg threading and able to pull up pants in standing with min assist for balance and sit <> stands. Donned shirt with set up assist. Min assist for transfer into w/c from EOB using RW. Simulated car transfer to sedan height with overall min assist using RW for short distance gait to and from simulated car with CGA and PT assisting with management of O2 tubing. Education and discussion for problem solving in regards to d/c planning (recommended sedan instead of tall truck which pt has), home setup and access to bedroom and bathroom (his bedroom currently has very high bed and 2 steps to get ot the bathroom) where now pt planning to stay on couch where he does not have to negotiate stairs to get to bathroom. Stair negotiation training for home entry practice (with bilateral rails) x 3 steps with min assist overall for balance. Verbal cues throughout session for correct hand placement during functional transfers. End of session requesting water, performed oral hygiene at sink and able to take sips without coughing as pt on water protocol. RN also present and handoff to her at end  of session for medication.   Therapy Documentation Precautions:  Precautions Precautions: Fall Precaution Comments: Supplemental 02 Restrictions Weight Bearing Restrictions: No Pain: Denies pain. C/o not sleeping in days and fatigued. Team is aware.     Therapy/Group: Individual Therapy  Canary Brim Ivory Broad, PT, DPT, CBIS  05/13/2018, 12:11 PM

## 2018-05-13 NOTE — Progress Notes (Signed)
[facility] PHYSICAL MEDICINE & REHABILITATION PROGRESS NOTE   Subjective/Complaints: Patient seen sitting up in bed this morning.  He states he did not sleep well overnight, stating that he slept for about 1 hour.  However per sleep chart patient slept approximately 5 hours.  ROS: + Sleep disturbance. Denies CP, shortness of breath, nausea, vomiting, diarrhea.   Objective:   No results found. No results for input(s): WBC, HGB, HCT, PLT in the last 72 hours. No results for input(s): NA, K, CL, CO2, GLUCOSE, BUN, CREATININE, CALCIUM in the last 72 hours.  Intake/Output Summary (Last 24 hours) at 05/13/2018 0904 Last data filed at 05/13/2018 0700 Gross per 24 hour  Intake 960 ml  Output 1150 ml  Net -190 ml     Physical Exam: Vital Signs Blood pressure (!) 155/81, pulse 68, temperature (!) 97.5 F (36.4 C), temperature source Oral, resp. rate 16, height 5\' 6"  (1.676 m), weight 76.9 kg, SpO2 99 %. Constitutional: No distress . Vital signs reviewed. HENT: Normocephalic.  Atraumatic.  Eyes: EOMI. No discharge. Cardiovascular: RRR.  No JVD. Respiratory: Normal effort.  Clear.  + [place]. GI: BS +.  Nondistended. Musc: No edema or tenderness in extremities. Skin: Intact.  Warm and dry. Neurological:  Alert and oriented, except for date of month.   Motor: Grossly 4-/5 throughout, slowly improving Psych: Flat.  Assessment/Plan: 1. Functional deficits secondary to hypoxic encephalopathy and debility which require 3+ hours per day of interdisciplinary therapy in a comprehensive inpatient rehab setting.  Physiatrist is providing close team supervision and 24 hour management of active medical problems listed below.  Physiatrist and rehab team continue to assess barriers to discharge/monitor patient progress toward functional and medical goals  Care Tool:  Bathing    Body parts bathed by patient: Right arm, Left arm, Chest, Abdomen, Right upper leg, Left upper leg, Right lower leg, Left  lower leg, Face, Front perineal area, Buttocks   Body parts bathed by helper: Front perineal area, Buttocks     Bathing assist Assist Level: Minimal Assistance - Patient > 75%     Upper Body Dressing/Undressing Upper body dressing   What is the patient wearing?: Pull over shirt    Upper body assist Assist Level: Supervision/Verbal cueing    Lower Body Dressing/Undressing Lower body dressing      What is the patient wearing?: Pants, Incontinence brief     Lower body assist Assist for lower body dressing: Moderate Assistance - Patient 50 - 74%     Toileting Toileting Toileting Activity did not occur Landscape architect and hygiene only): (incontinent care)  Toileting assist Assist for toileting: Moderate Assistance - Patient 50 - 74%     Transfers Chair/bed transfer  Transfers assist  Chair/bed transfer activity did not occur: Safety/medical concerns  Chair/bed transfer assist level: Supervision/Verbal cueing     Locomotion Ambulation   Ambulation assist   Ambulation activity did not occur: Safety/medical concerns  Assist level: Contact Guard/Touching assist Assistive device: Walker-rolling Max distance: 31ft   Walk 10 feet activity   Assist  Walk 10 feet activity did not occur: Safety/medical concerns  Assist level: Contact Guard/Touching assist Assistive device: Rollator   Walk 50 feet activity   Assist Walk 50 feet with 2 turns activity did not occur: Safety/medical concerns  Assist level: Contact Guard/Touching assist Assistive device: Walker-rolling    Walk 150 feet activity   Assist Walk 150 feet activity did not occur: Safety/medical concerns         Walk  10 feet on uneven surface  activity   Assist Walk 10 feet on uneven surfaces activity did not occur: Safety/medical concerns         Wheelchair     Assist Will patient use wheelchair at discharge?: Yes Type of Wheelchair: Manual    Wheelchair assist level:  Supervision/Verbal cueing Max wheelchair distance: 182ft    Wheelchair 50 feet with 2 turns activity    Assist    Wheelchair 50 feet with 2 turns activity did not occur: Safety/medical concerns   Assist Level: Supervision/Verbal cueing   Wheelchair 150 feet activity     Assist Wheelchair 150 feet activity did not occur: Safety/medical concerns   Assist Level: Supervision/Verbal cueing     Medical Problem List and Plan: 1.  Encephalopathy/debility secondary to  Acute hypoxic respiratory failure/RUL pneumonia complicated by history of COPD oxygen dependent. Continue nebulizers as directed  Continue CIR 2.  DVT Prophylaxis/Anticoagulation: Lovenox. Monitor for any bleeding episodes 3. Pain Management:  Tylenol as needed 4. Mood/delirium:  Provide emotional support 5. Neuropsych: This patient is  not capable of making decisions on his own behalf. 6. Skin/Wound Care:   Added Diflucan 100 mg p.o. twice daily x3 (completed on 1/21) days for inguinal rash 7. Fluids/Electrolytes/Nutrition:  Routine in and out's with follow-up chemistries 8. Dysphagia.Cortrak tube: Swallow study 05/03/2018 dysphagia #1 honey liquids.  DCed NG  IVF nightly 9. ID/Strep Pneumo Bacteremia.  Completed ceftriaxone on 05/05/2018  10. CAD with stenting. Plavix. 11. Hypertension. Clonidine 0.1 mg daily, Toprol 50 mg daily  Labile on 1/27 12. Diabetes mellitus with peripheral neuropathy. Levemir BID. Checking blood sugars before meals and at bedtime. Patient on Glucophage 500 mg daily prior to admission             Reduced Levemir to 10 units twice daily as we convert to orals alone, DC'd on 1/20  Glucophage to 500 mg daily started on 1/22  Labile on 1/27 13. History of gout. Monitor for any flareups 14. Constipation. Laxative assistance.  15.  Acute blood loss anemia  Hemoglobin 11.4 on 1/20   Plan to order labs for later this week  Continue to monitor 16.  Hypoalbuminemia  Supplement initiated 17.   Sleep disturbance  Seroquel DC'd  Melatonin started on 1/20  Ambien started on 1/21, increased on 1/23, DC'd on 1/27  Restoril started on 1/27 18.  Prolonged QTC  ECG reviewed  Monitor with medications  LOS: 10 days A FACE TO FACE EVALUATION WAS PERFORMED  Ankit Lorie Phenix 05/13/2018, 9:04 AM

## 2018-05-13 NOTE — Progress Notes (Signed)
Speech Language Pathology Daily Session Note  Patient Details  Name: Paul [name] MRN: [id number] Date of Birth: 1950-12-31  Today's Date: 05/13/2018 SLP Individual Time: 0830-0930 SLP Individual Time Calculation (min): 60 min  Short Term Goals: Week 2: SLP Short Term Goal 1 (Week 2): Pt will complete instrumental swallow exam to assess potential for liquid advancement. SLP Short Term Goal 1 - Progress (Week 2): Progressing toward goal SLP Short Term Goal 2 (Week 2): Pt will consume least restrictive diet with min assist for use of swallowing precautions and minimal overt s/s of aspiration.  SLP Short Term Goal 2 - Progress (Week 2): Progressing toward goal SLP Short Term Goal 3 (Week 2): Pt will sustain his attention to a basic, functional task for ~10 minutes with min cues for redirection.   SLP Short Term Goal 3 - Progress (Week 2): Met SLP Short Term Goal 4 (Week 2): Pt will complete basic, familiar tasks with min assist for functional problem solving.  SLP Short Term Goal 4 - Progress (Week 2): Progressing toward goal SLP Short Term Goal 5 (Week 2): Pt will return demonstration of at least 2 safety precautions with mod assist multimodal cues.   SLP Short Term Goal 5 - Progress (Week 2): Progressing toward goal SLP Short Term Goal 6 (Week 2): Pt will recall daily information with mod assist multimodal cues for use of external aids.   SLP Short Term Goal 6 - Progress (Week 2): Progressing toward goal  Skilled Therapeutic Interventions: Pt resting upon arrival.  Easily awakened; assisted to sit EOB for session.  Reviewed weekly goals.  Pt easily sustained attention for duration of session with no cues needed to redirect. He verbalized and demonstrated safety precautions with regard to swallowing (swallow twice, small sips) and verbalized precautions re: use of call bell, preparing to sit when using walker with no cues.  Memory notebook introduced - pt acknowledged difficulty with visual  acuity, impacting his ability to read wall calendar and notebook; requesting clinician to write information.  Pt dictated events of hospitalization (date of admission, length of intubation) and deficits associated with acute illness with initial verbal cues for recall; then able to restate events 50% independently; 50% with cues to use calendar/notebook. Pt acknowledged changes in memory since admission; insists family will help him with recall post-discharge.  Discussed onus of responsibility for memory belonging to pt - he reluctantly agreed.  Recalled hypothetical grocery list of three basic items independently after five minutes.  Reviewed swallowing deficits - pt trialed with thin liquids; coughing notable on 6/10 trials initially.  Cues to hold material orally then swallow with deliberation resulted in cough on 2/5 trials.  Pt making steady progress toward goals.  Pt left with bed alarm/call light in place; safety mats at EOB.    Pain Pain Assessment Pain Scale: 0-10  Therapy/Group: Individual Therapy  [name] 05/13/2018, 9:53 AM

## 2018-05-13 NOTE — Progress Notes (Signed)
Water protocol completed following oral care. Patient tolerated well, no coughing approx 10 sips.

## 2018-05-13 NOTE — Progress Notes (Signed)
Nutrition Follow-up  INTERVENTION:   - Continue Pro-stat 30 ml BID, each supplement provides 100 kcal and 15 grams of protein  - Continue MVI with minerals daily  - Continue Magic cup TID with meals, each supplement provides 290 kcal and 9 grams of protein  - Continue double protein portions with meals  NUTRITION DIAGNOSIS:   Increased nutrient needs related to other (therapies), chronic illness (COPD) as evidenced by estimated needs.  Ongoing  GOAL:   Patient will meet greater than or equal to 90% of their needs  Progressing  MONITOR:   PO intake, Supplement acceptance, Diet advancement, Skin, Labs, Weight trends  REASON FOR ASSESSMENT:   Consult Enteral/tube feeding initiation and management  ASSESSMENT:   68 year old male who presented to the ED on 04/21/18 with SOB and AMS. Pt admitted with acute respiratory failure related to RUL pneumonia requiring intubation on admission, AKI, and DKA. PMH significant for COPD on home oxygen, CAD, HTN, DM, tobacco use. Pt extubated on 04/27/18 and Cortrak placed for TF on 04/29/18. Diet advanced to dysphagia 1 with honey-thick liquids after MBS on 05/03/18.  1/19 - Cortrak d/c 1/22 - diet upgraded to D2  Weight down 2 lbs overall since admission to CIR.  Spoke with pt and brother at bedside. Pt reports eating well at lunch: sherbet, mashed potatoes and gravy. Pt states that he is "scared" of the meat and worried it "will go right through me." Pt states that he likes the chocolate Magic Cup.  RD continued to encourage adequate PO intake.  Pt with complaints about sleep, stating that he has not slept more than a few hours since he's been at CIR.  Pt is pleased with change to D2 diet. Pt states, "sometimes I can't eat all of what y'all bring me."  Meal Completion: 50-100% x last 8 meals  Medications reviewed and include: Pro-stat BID, SSI, Metformin, MVI with minerals, Protonix, Senna  Labs reviewed. CBG's: 149, 116, 139, 150 x  24 hours  UOP: 1450 ml x 24 hours  Diet Order:   Diet Order            DIET DYS 2 Room service appropriate? Yes; Fluid consistency: Honey Thick  Diet effective now              EDUCATION NEEDS:   Not appropriate for education at this time  Skin:  Skin Assessment: Skin Integrity Issues: Stage I: R face Other: skin tear to scrotum  Last BM:  1/26  Height:   Ht Readings from Last 1 Encounters:  05/03/18 5\' 6"  (1.676 m)    Weight:   Wt Readings from Last 1 Encounters:  05/13/18 76.9 kg    Ideal Body Weight:  64.5 kg  BMI:  Body mass index is 27.36 kg/m.  Estimated Nutritional Needs:   Kcal:  2200-2400  Protein:  115-130 grams  Fluid:  >/= 2.0 L    Gaynell Face, MS, RD, LDN Inpatient Clinical Dietitian Pager: [phone] Weekend/After Hours: [phone]

## 2018-05-14 ENCOUNTER — Inpatient Hospital Stay (HOSPITAL_COMMUNITY): Payer: Medicare Other | Admitting: Physical Therapy

## 2018-05-14 ENCOUNTER — Inpatient Hospital Stay (HOSPITAL_COMMUNITY): Payer: Medicare Other

## 2018-05-14 ENCOUNTER — Inpatient Hospital Stay (HOSPITAL_COMMUNITY): Payer: Medicare Other | Admitting: Speech Pathology

## 2018-05-14 ENCOUNTER — Inpatient Hospital Stay (HOSPITAL_COMMUNITY): Payer: Medicare Other | Admitting: Occupational Therapy

## 2018-05-14 LAB — GLUCOSE, CAPILLARY
Glucose-Capillary: 114 mg/dL — ABNORMAL HIGH (ref 70–99)
Glucose-Capillary: 188 mg/dL — ABNORMAL HIGH (ref 70–99)
Glucose-Capillary: 120 mg/dL — ABNORMAL HIGH (ref 70–99)
Glucose-Capillary: 171 mg/dL — ABNORMAL HIGH (ref 70–99)

## 2018-05-14 MED ORDER — LORATADINE 10 MG PO TABS
10.0000 mg | ORAL_TABLET | Freq: Every day | ORAL | Status: DC
  Administered 2018-05-14 – 2018-05-22 (×9): 10 mg via ORAL
  Filled 2018-05-14 (×9): qty 1

## 2018-05-14 MED ORDER — TEMAZEPAM 7.5 MG PO CAPS
15.0000 mg | ORAL_CAPSULE | Freq: Every day | ORAL | Status: DC
  Administered 2018-05-14 – 2018-05-21 (×8): 15 mg via ORAL
  Filled 2018-05-14 (×8): qty 2

## 2018-05-14 MED ORDER — LORATADINE 5 MG/5ML PO SYRP
10.0000 mg | ORAL_SOLUTION | Freq: Every day | ORAL | Status: DC
  Filled 2018-05-14: qty 10

## 2018-05-14 NOTE — Progress Notes (Signed)
Speech Language Pathology Daily Session Note  Patient Details  Name: Paul [name] MRN: [id number] Date of Birth: [birth year]  Today's Date: 05/14/2018 SLP Individual Time: 9702-6378 SLP Individual Time Calculation (min): 44 min  Short Term Goals: Week 2: SLP Short Term Goal 1 (Week 2): Pt will complete instrumental swallow exam to assess potential for liquid advancement. SLP Short Term Goal 1 - Progress (Week 2): Progressing toward goal SLP Short Term Goal 2 (Week 2): Pt will consume least restrictive diet with min assist for use of swallowing precautions and minimal overt s/s of aspiration.  SLP Short Term Goal 2 - Progress (Week 2): Progressing toward goal SLP Short Term Goal 3 (Week 2): Pt will sustain his attention to a basic, functional task for ~10 minutes with min cues for redirection.   SLP Short Term Goal 3 - Progress (Week 2): Met SLP Short Term Goal 4 (Week 2): Pt will complete basic, familiar tasks with min assist for functional problem solving.  SLP Short Term Goal 4 - Progress (Week 2): Progressing toward goal SLP Short Term Goal 5 (Week 2): Pt will return demonstration of at least 2 safety precautions with mod assist multimodal cues.   SLP Short Term Goal 5 - Progress (Week 2): Progressing toward goal SLP Short Term Goal 6 (Week 2): Pt will recall daily information with mod assist multimodal cues for use of external aids.   SLP Short Term Goal 6 - Progress (Week 2): Progressing toward goal  Skilled Therapeutic Interventions: Pt was seen for skilled ST targeting dysphagia and cognition. Pt reported increased shortness of breath and significant discomfort with feelings of pressure in his ears and general head congestion. Pt's perceived shortness of breath and congestion persisted throughout ST session and RN was made aware. Given pt's report and current presentation, SLP reviewed pt's chart for indications of decreased lung function and/or medical status before proceeding with  session. However, no new chest x-ray or lung sound abnormalities were found. Following thorough oral care, pt consumed approximately 4oz of thin H2O with supervision verbal cues to reinforce use of small sips and slow rate. Pt exhibited immediate cough X2 during water trials. SLP made record of water consumption on water protocol chart in room. Pt required max assist to recall memory strategies including use of memory notebook which was introduced during previous ST session. With min verbal cues for redirection, pt sustained attention to functional cognitive tasks and basic conversation for approximately 3-5 minute intervals. Pt's low frustration tolerance and overall discomfort believed to influence his motivation to attend to basic cognitive therapy tasks; as a result session was ended early and pt missed 16 minutes of therapy time. Pt was left sitting in wheelchair with chair alarm set and all needs met. Recommend continue per current plan of care.      Pain Pain Assessment Pain Scale: Faces Faces Pain Scale: Hurts little more Pain Type: Acute pain Pain Location: Ear(ear pressure and head congestion) Pain Orientation: Right Pain Descriptors / Indicators: Pressure;Discomfort Pain Onset: On-going Pain Intervention(s): RN made aware  Therapy/Group: Individual Therapy   Jettie Booze, Student SLP   Jettie Booze 05/14/2018, 12:56 PM

## 2018-05-14 NOTE — Progress Notes (Signed)
Occupational Therapy Session Note  Patient Details  Name: Paul [name] MRN: [id number] Date of Birth: 11/27/1950  Today's Date: 05/14/2018 OT Individual Time: 1130-1200 OT Individual Time Calculation (min): 30 min    Short Term Goals: Week 2:  OT Short Term Goal 1 (Week 2): Pt will complete LB bathing sit to stand with close supervision. OT Short Term Goal 2 (Week 2): Pt will complete LB dressing with supervision sit to stand.   OT Short Term Goal 3 (Week 2): Pt will maintain standing at the sink for 2 mins or greater to complete grooming tasks.   OT Short Term Goal 4 (Week 2): Pt will perform toilet transfer to the 3:1 with RW and close supervision.  Skilled Therapeutic Interventions/Progress Updates:    Session focused on B UE strengthening and self feeding. No c/o pain, just fatigue from not sleeping. Pt held 3lb dowel and completed functional forward reaching while seated with demo provided and instruction for form. Pt's lunch was delivered and he was assisted in set up of all containers. Intermittent cueing for 2x swallow and bite size. Pt encouraged to maximize caloric intake d/t decreased appetite. Pt ate 15% of lunch and requested to return to bed. CGA SPT to bed from w/c. Pt left supine, bed alarm set and all needs met.   Therapy Documentation Precautions:  Precautions Precautions: Fall Precaution Comments: Supplemental 02 Restrictions Weight Bearing Restrictions: No   Vital Signs: Therapy Vitals Temp: 97.7 F (36.5 C) Temp Source: Oral Pulse Rate: 75 Resp: 16 BP: 139/70 Patient Position (if appropriate): Lying Oxygen Therapy SpO2: 99 % O2 Device: Nasal Cannula Pain:  No pain reported   Therapy/Group: Individual Therapy  Curtis Sites 05/14/2018, 7:18 AM

## 2018-05-14 NOTE — Progress Notes (Signed)
[place] PHYSICAL MEDICINE & REHABILITATION PROGRESS NOTE   Subjective/Complaints: Patient seen sitting up at the edge of his bed this morning.  He states he did not sleep well overnight, confirmed with sleep chart.  Circular and repetitive discussion with patient regarding sleep and muscle soreness.  ROS: + Sleep disturbance, muscle soreness. Denies CP, shortness of breath, nausea, vomiting, diarrhea.   Objective:   No results found. No results for input(s): WBC, HGB, HCT, PLT in the last 72 hours. No results for input(s): NA, K, CL, CO2, GLUCOSE, BUN, CREATININE, CALCIUM in the last 72 hours.  Intake/Output Summary (Last 24 hours) at 05/14/2018 0842 Last data filed at 05/14/2018 1610 Gross per 24 hour  Intake 720 ml  Output 1175 ml  Net -455 ml     Physical Exam: Vital Signs Blood pressure 139/70, pulse 74, temperature 97.7 F (36.5 C), temperature source Oral, resp. rate 18, height 5\' 6"  (1.676 m), weight 76.8 kg, SpO2 100 %. Constitutional: No distress . Vital signs reviewed. HENT: Normocephalic.  Atraumatic.  Eyes: EOMI. No discharge. Cardiovascular: RRR.  No JVD. Respiratory: Normal effort.  Clear.  + Laura. GI: BS +.  Nondistended. Musc: No edema in extremities.  Muscle tenderness in bilateral lower extremities Skin: Intact.  Warm and dry. Neurological:  Alert and oriented, except for date of month.   Motor: Grossly 4-/5 throughout, unchanged Psych: Flat.  Assessment/Plan: 1. Functional deficits secondary to hypoxic encephalopathy and debility which require 3+ hours per day of interdisciplinary therapy in a comprehensive inpatient rehab setting.  Physiatrist is providing close team supervision and 24 hour management of active medical problems listed below.  Physiatrist and rehab team continue to assess barriers to discharge/monitor patient progress toward functional and medical goals  Care Tool:  Bathing    Body parts bathed by patient: Right arm, Left arm,  Chest, Abdomen, Right upper leg, Left upper leg, Right lower leg, Left lower leg, Face, Front perineal area, Buttocks   Body parts bathed by helper: Front perineal area, Buttocks     Bathing assist Assist Level: Minimal Assistance - Patient > 75%     Upper Body Dressing/Undressing Upper body dressing   What is the patient wearing?: Pull over shirt    Upper body assist Assist Level: Supervision/Verbal cueing    Lower Body Dressing/Undressing Lower body dressing      What is the patient wearing?: Pants, Incontinence brief     Lower body assist Assist for lower body dressing: Minimal Assistance - Patient > 75%     Toileting Toileting Toileting Activity did not occur (Clothing management and hygiene only): (incontinent care)  Toileting assist Assist for toileting: Moderate Assistance - Patient 50 - 74%     Transfers Chair/bed transfer  Transfers assist  Chair/bed transfer activity did not occur: Safety/medical concerns  Chair/bed transfer assist level: Contact Guard/Touching assist     Locomotion Ambulation   Ambulation assist   Ambulation activity did not occur: Safety/medical concerns  Assist level: Minimal Assistance - Patient > 75% Assistive device: Walker-rolling Max distance: 12'   Walk 10 feet activity   Assist  Walk 10 feet activity did not occur: Safety/medical concerns  Assist level: Contact Guard/Touching assist Assistive device: Rollator   Walk 50 feet activity   Assist Walk 50 feet with 2 turns activity did not occur: Safety/medical concerns  Assist level: Contact Guard/Touching assist Assistive device: Walker-rolling    Walk 150 feet activity   Assist Walk 150 feet activity did not occur: Safety/medical concerns  Walk 10 feet on uneven surface  activity   Assist Walk 10 feet on uneven surfaces activity did not occur: Safety/medical concerns         Wheelchair     Assist Will patient use wheelchair at  discharge?: Yes Type of Wheelchair: Manual    Wheelchair assist level: Supervision/Verbal cueing Max wheelchair distance: 147ft    Wheelchair 50 feet with 2 turns activity    Assist    Wheelchair 50 feet with 2 turns activity did not occur: Safety/medical concerns   Assist Level: Supervision/Verbal cueing   Wheelchair 150 feet activity     Assist Wheelchair 150 feet activity did not occur: Safety/medical concerns   Assist Level: Supervision/Verbal cueing     Medical Problem List and Plan: 1.  Encephalopathy/debility secondary to  Acute hypoxic respiratory failure/RUL pneumonia complicated by history of COPD oxygen dependent. Continue nebulizers as directed  Continue CIR 2.  DVT Prophylaxis/Anticoagulation: Lovenox. Monitor for any bleeding episodes 3. Pain Management:  Tylenol as needed 4. Mood/delirium:  Provide emotional support 5. Neuropsych: This patient is  not capable of making decisions on his own behalf. 6. Skin/Wound Care:   Added Diflucan 100 mg p.o. twice daily x3 (completed on 1/21) days for inguinal rash 7. Fluids/Electrolytes/Nutrition:  Routine in and out's   Labs ordered for tomorrow 8. Dysphagia.Cortrak tube: Swallow study 05/03/2018 dysphagia #1 honey liquids.  DCed NG  IVF nightly DC'd on 1/28, continue to monitor oral fluid intake 9. ID/Strep Pneumo Bacteremia.  Completed ceftriaxone on 05/05/2018  10. CAD with stenting. Plavix. 11. Hypertension. Clonidine 0.1 mg daily, Toprol 50 mg daily  Relatively controlled on 1/28 12. Diabetes mellitus with peripheral neuropathy. Levemir BID. Checking blood sugars before meals and at bedtime. Patient on Glucophage 500 mg daily prior to admission             Reduced Levemir to 10 units twice daily as we convert to orals alone, DC'd on 1/20  Glucophage to 500 mg daily started on 1/22  Slightly labile on 1/28 13. History of gout. Monitor for any flareups 14. Constipation. Laxative assistance.  15.  Acute  blood loss anemia  Hemoglobin 11.4 on 1/20   Labs ordered for tomorrow  Continue to monitor 16.  Hypoalbuminemia  Supplement initiated 17.  Sleep disturbance  Seroquel DC'd  Melatonin started on 1/20  Ambien started on 1/21, increased on 1/23, DC'd on 1/27  Restoril started on 1/27, increased on 1/28 18.  Prolonged QTC  ECG reviewed  Monitor with medications  LOS: 11 days A FACE TO FACE EVALUATION WAS PERFORMED  Hilding Quintanar Lorie Phenix 05/14/2018, 8:42 AM

## 2018-05-14 NOTE — Progress Notes (Signed)
Occupational Therapy Session Note  Patient Details  Name: Paul [name] MRN: [id number] Date of Birth: 03-Jul-1950  Today's Date: 05/14/2018 OT Individual Time: 0902-1003 OT Individual Time Calculation (min): 61 min    Short Term Goals: Week 2:  OT Short Term Goal 1 (Week 2): Pt will complete LB bathing sit to stand with close supervision. OT Short Term Goal 2 (Week 2): Pt will complete LB dressing with supervision sit to stand.   OT Short Term Goal 3 (Week 2): Pt will maintain standing at the sink for 2 mins or greater to complete grooming tasks.   OT Short Term Goal 4 (Week 2): Pt will perform toilet transfer to the 3:1 with RW and close supervision.  Skilled Therapeutic Interventions/Progress Updates:    Pt completed bathing and dressing during session.  Min guard assist to complete functional mobility to the shower with use of the RW.  Oxygen removed during session with O2 sats remaining at 92% on room air or greater. He completed all bathing sit to stand with min guard assist.  He transferred out to the EOB for dressing tasks, also performed at min guard assist level.  Next had pt ambulate to the sink with the RW and stand for 1 min with supervision while combing his hair and then setting up his tooth brush for oral hygiene.  He then sat for brushing his teeth.  He then was given ice water per water protocol and positioned beside the bed in the wheelchair with call button and phone in reach.  He was able to drink 4-5 oz following precautions without coughing.  Finished session with pt in the wheelchair at bedside with call button and phone in reach and alarm belt in place.    Therapy Documentation Precautions:  Precautions Precautions: Fall Precaution Comments: Supplemental 02 Restrictions Weight Bearing Restrictions: No   Vital Signs: Therapy Vitals Pulse Rate: (!) 105 Resp: 18 BP: 99/71 Patient Position (if appropriate): Sitting Oxygen Therapy SpO2: 98 % O2 Device: Nasal  Cannula O2 Flow Rate (L/min): 2 L/min Pulse Oximetry Type: Intermittent Pain: Pain Assessment Pain Scale: Faces Pain Score: 0-No pain Faces Pain Scale: Hurts a little bit Pain Type: Acute pain Pain Location: Leg Pain Orientation: Right Pain Descriptors / Indicators: Discomfort Pain Onset: On-going Pain Intervention(s): Repositioned;Emotional support ADL: See Care Tool Section for some details of ADL  Therapy/Group: Individual Therapy  [name] OTR/L  05/14/2018, 11:15 AM

## 2018-05-14 NOTE — Progress Notes (Signed)
Physical Therapy Session Note  Patient Details  Name: Paul [name] MRN: [id number] Date of Birth: March 15, 1951  Today's Date: 05/14/2018 PT Individual Time: 1515-1600 PT Individual Time Calculation (min): 45 min   Short Term Goals: Week 2:  PT Short Term Goal 1 (Week 2): Pt will ambulate 32ft with CGA and LRAD  PT Short Term Goal 2 (Week 2): Pt will propell >100dft without rest break to improve cardiovascular endurance.  PT Short Term Goal 3 (Week 2): Pt will ascend 2 step with UE support and CGA assist  PT Short Term Goal 4 (Week 2): Pt will perform bed mobiltiy with supervision assist   Skilled Therapeutic Interventions/Progress Updates:   Pt in supine and agreeable to therapy, denies pain. CGA stand pivot transfer to w/c and total assist transport to/from therapy gym for time management. Worked on endurance w/ gait, ambulated 50' x2 and 100' w/ CGA using RW. Pt on 2 L/min O2 throughout session, no increase in work of breathing and pt denied SOB. Practiced negotiating 4 steps w/ bilateral rails x3 reps w/ seated rest in between 2/2 fatigue. Static standing balance on airex pad w/o UE support while tossing horseshoes, min assist for balance in 1-2 min bouts. Ambulated back to room ~100' w/ CGA. Ended session in supine and all needs in reach.   Therapy Documentation Precautions:  Precautions Precautions: Fall Precaution Comments: Supplemental 02 Restrictions Weight Bearing Restrictions: No  Therapy/Group: Individual Therapy  Ishmael Berkovich Clent Demark 05/14/2018, 4:56 PM

## 2018-05-14 NOTE — Plan of Care (Signed)
  Problem: RH BOWEL ELIMINATION Goal: RH STG MANAGE BOWEL W/MEDICATION W/ASSISTANCE Description STG Manage Bowel with Medication with Assistance. min  Outcome: Progressing   Problem: RH BLADDER ELIMINATION Goal: RH STG MANAGE BLADDER WITH ASSISTANCE Description STG Manage Bladder With Assistance. min  Outcome: Progressing   Problem: RH SKIN INTEGRITY Goal: RH STG SKIN FREE OF INFECTION/BREAKDOWN Description Free of further breakdown. Free of infection while on rehab with mod assist  Outcome: Progressing Goal: RH STG MAINTAIN SKIN INTEGRITY WITH ASSISTANCE Description STG Maintain Skin Integrity With Assistance. Mod  Outcome: Progressing   Problem: RH SAFETY Goal: RH STG ADHERE TO SAFETY PRECAUTIONS W/ASSISTANCE/DEVICE Description STG Adhere to Safety Precautions With Assistance/Device. mod  Outcome: Progressing

## 2018-05-15 ENCOUNTER — Inpatient Hospital Stay (HOSPITAL_COMMUNITY): Payer: Medicare Other | Admitting: Physical Therapy

## 2018-05-15 ENCOUNTER — Inpatient Hospital Stay (HOSPITAL_COMMUNITY): Payer: Medicare Other | Admitting: Occupational Therapy

## 2018-05-15 ENCOUNTER — Inpatient Hospital Stay (HOSPITAL_COMMUNITY): Payer: Medicare Other | Admitting: Speech Pathology

## 2018-05-15 ENCOUNTER — Inpatient Hospital Stay (HOSPITAL_COMMUNITY): Payer: Medicare Other

## 2018-05-15 ENCOUNTER — Encounter (HOSPITAL_COMMUNITY): Payer: Medicare Other | Admitting: Speech Pathology

## 2018-05-15 LAB — CBC WITH DIFFERENTIAL/PLATELET
Abs Immature Granulocytes: 0.07 10*3/uL (ref 0.00–0.07)
Basophils Absolute: 0.1 10*3/uL (ref 0.0–0.1)
Basophils Relative: 1 %
Eosinophils Absolute: 0.3 10*3/uL (ref 0.0–0.5)
Eosinophils Relative: 4 %
HCT: 40 % (ref 39.0–52.0)
Hemoglobin: 12.6 g/dL — ABNORMAL LOW (ref 13.0–17.0)
Immature Granulocytes: 1 %
Lymphocytes Relative: 39 %
Lymphs Abs: 3 10*3/uL (ref 0.7–4.0)
MCH: 30.3 pg (ref 26.0–34.0)
MCHC: 31.5 g/dL (ref 30.0–36.0)
MCV: 96.2 fL (ref 80.0–100.0)
Monocytes Absolute: 0.5 10*3/uL (ref 0.1–1.0)
Monocytes Relative: 7 %
Neutro Abs: 3.7 10*3/uL (ref 1.7–7.7)
Neutrophils Relative %: 48 %
Platelets: 247 10*3/uL (ref 150–400)
RBC: 4.16 MIL/uL — ABNORMAL LOW (ref 4.22–5.81)
RDW: 15 % (ref 11.5–15.5)
WBC: 7.6 10*3/uL (ref 4.0–10.5)
nRBC: 0 % (ref 0.0–0.2)

## 2018-05-15 LAB — GLUCOSE, CAPILLARY
Glucose-Capillary: 116 mg/dL — ABNORMAL HIGH (ref 70–99)
Glucose-Capillary: 178 mg/dL — ABNORMAL HIGH (ref 70–99)
Glucose-Capillary: 118 mg/dL — ABNORMAL HIGH (ref 70–99)
Glucose-Capillary: 113 mg/dL — ABNORMAL HIGH (ref 70–99)

## 2018-05-15 LAB — BASIC METABOLIC PANEL
Anion gap: 9 (ref 5–15)
BUN: 16 mg/dL (ref 8–23)
CO2: 27 mmol/L (ref 22–32)
Calcium: 9.1 mg/dL (ref 8.9–10.3)
Chloride: 100 mmol/L (ref 98–111)
Creatinine, Ser: 0.75 mg/dL (ref 0.61–1.24)
GFR calc Af Amer: >60 mL/min (ref >60)
GFR calc non Af Amer: >60 mL/min (ref >60)
Glucose, Bld: 143 mg/dL — ABNORMAL HIGH (ref 70–99)
Potassium: 4 mmol/L (ref 3.5–5.1)
Sodium: 136 mmol/L (ref 135–145)

## 2018-05-15 NOTE — Progress Notes (Signed)
Modified Barium Swallow Progress Note  Patient Details  Name: Paul [name] MRN: [id number] Date of Birth: [birth year]  Today's Date: 05/15/2018  Modified Barium Swallow completed.  Full report located under Chart Review in the Imaging Section.  Brief recommendations include the following:  Clinical Impression  Pt presents with mild oral phase and mild pharyngeal dysphagia that is primarily sensory in natre. Pt presents with decreased bolus cohesion and mildly increased mastication when consuming soft solid peaches. Pt with delayed swallow intiaiton to pyriform sinuses across nectar thick liquids and thin liquids. Pt's delayed swallow initiation leads to flash penetration with thin liquids. Pt was able to protect airway as penetration squeezed out during swallow. Pt with head thrush up during consumption. Use of a straw facilitated neutral head positioning therefore recommend use of straw. Pt able to consume mixed peaches and thin barium with great airway protection. Whole barium tablet consumed with thin liquids via straw. Recommend upgrading to thin liquids via straw, medicine whole with thin liquids.    Swallow Evaluation Recommendations       SLP Diet Recommendations: Dysphagia 2 (Fine chop) solids;Thin liquid   Liquid Administration via: Cup;Straw   Medication Administration: Whole meds with liquid   Supervision: Patient able to self feed;Full supervision/cueing for compensatory strategies   Compensations: Minimize environmental distractions;Slow rate;Small sips/bites   Postural Changes: Seated upright at 90 degrees   Oral Care Recommendations: Oral care BID        Zyire Eidson 05/15/2018,11:31 AM

## 2018-05-15 NOTE — Progress Notes (Signed)
Physical Therapy Session Note  Patient Details  Name: Paul [name] MRN: [id number] Date of Birth: 10/08/1950  Today's Date: 05/15/2018 PT Individual Time: 0800-0845 PT Individual Time Calculation (min): 45 min   Short Term Goals: Week 2:  PT Short Term Goal 1 (Week 2): Pt will ambulate 70f with CGA and LRAD  PT Short Term Goal 2 (Week 2): Pt will propell >100dft without rest break to improve cardiovascular endurance.  PT Short Term Goal 3 (Week 2): Pt will ascend 2 step with UE support and CGA assist  PT Short Term Goal 4 (Week 2): Pt will perform bed mobiltiy with supervision assist   Skilled Therapeutic Interventions/Progress Updates:    Pt received in bed, able to go supine to sit independently, no v/c or physical assistance given. He was able to don pants and sweatshirt with S. Required modA to put on shoes. Sit to stand transfers with CGA throughout session. He ambulated 175 ft with CGA, v/c for clearing L toes during swing phase. Pt stated this was the furthest he had walked, and was SOB but maintaining SpO2 > 92% on 3L/min O2. Pt wheeled WC 75 ft with set up assist, no physical assist. Nustep for 2 intervals of 4 min. Pt expressed difficulty with first 4 min interval, but was able to carry on conversation for the second interval. Pt propelled WC 50 ft with set up assist, then stopped due to fatigue. From here he was pushed back to room totalA due to time restraints. Pt was left in his WC in room with alarm belt on, call bell in reach, needs met.  Therapy Documentation Precautions:  Precautions Precautions: Fall Precaution Comments: Supplemental 02 Restrictions Weight Bearing Restrictions: No General:   Vital Signs: Therapy Vitals Temp: 98 F (36.7 C) Temp Source: Oral Pulse Rate: 86 Resp: 19 BP: 136/84 Patient Position (if appropriate): Lying Oxygen Therapy SpO2: 93 % O2 Device: Nasal Cannula O2 Flow Rate (L/min): 3 L/min Pain: Pain Assessment Pain Scale: Faces Pain  Score: 0-No pain Faces Pain Scale: Hurts a little bit Pain Location: Leg Pain Orientation: Right;Left Pain Descriptors / Indicators: Discomfort Pain Intervention(s): Ambulation/increased activity      Therapy/Group: Individual Therapy   ERonnell Guadalajara SPT  05/15/2018, 12:31 PM

## 2018-05-15 NOTE — Progress Notes (Signed)
Occupational Therapy Session Note  Patient Details  Name: Paul [name] MRN: [id number] Date of Birth: 1950-12-15  Today's Date: 05/15/2018 OT Individual Time: 1300-1400 OT Individual Time Calculation (min): 60 min    Short Term Goals: Week 2:  OT Short Term Goal 1 (Week 2): Pt will complete LB bathing sit to stand with close supervision. OT Short Term Goal 2 (Week 2): Pt will complete LB dressing with supervision sit to stand.   OT Short Term Goal 3 (Week 2): Pt will maintain standing at the sink for 2 mins or greater to complete grooming tasks.   OT Short Term Goal 4 (Week 2): Pt will perform toilet transfer to the 3:1 with RW and close supervision.  Skilled Therapeutic Interventions/Progress Updates:    Pt completed bathing and dressing during session.  He was able to maintain O2 sats at 92% or better throughout session without the need for supplemental oxygen.  He was able to ambulate to and from the shower with use of the RW and min guard assist.  He completed all bathing and dressing at supervision level as well.  Supervision for standing to complete oral hygiene and combing hair, with pt being able to maintain standing for two mins this session.  Finished session with pt transferring back to the bed with supervision and laying down.  Oxygen taken off secondary to pt having sats greater than 90.  Call button and phone in reach with bed alarm in place.    Therapy Documentation Precautions:  Precautions Precautions: Fall Precaution Comments: Supplemental 02 Restrictions Weight Bearing Restrictions: No   Vital Signs: Therapy Vitals Temp: 98.1 F (36.7 C) Temp Source: Oral Pulse Rate: 75 Resp: 17 BP: 122/67 Patient Position (if appropriate): Sitting Oxygen Therapy SpO2: 97 % O2 Device: Room Air Pain: Pain Assessment Pain Scale: Faces Pain Score: 0-No pain Faces Pain Scale: No hurt Pain Location: Leg Pain Orientation: Right;Left Pain Descriptors / Indicators:  Discomfort Pain Intervention(s): Ambulation/increased activity ADL: See Care Tool Section for some details of ADL  Therapy/Group: Individual Therapy  Crystle Carelli OTR/L 05/15/2018, 3:41 PM

## 2018-05-15 NOTE — Progress Notes (Signed)
Speech Language Pathology Daily Session Note  Patient Details  Name: Paul [name] MRN: [id number] Date of Birth: 05-20-1950  Today's Date: 05/15/2018   Skilled treatment session #1 SLP Individual Time: 4431-5400 SLP Individual Time Calculation (min): 10 min  Skilled treatment session #2 SLP Individual Time: 8676-1950 SLP Individual Time Calculation (min): 8 min   Skilled treatment session #3 SLP Individual Time: 9326-7124 SLP Individual Time Calculation (min): 10 min  Short Term Goals: Week 2: SLP Short Term Goal 1 (Week 2): Pt will complete instrumental swallow exam to assess potential for liquid advancement. SLP Short Term Goal 1 - Progress (Week 2): Progressing toward goal SLP Short Term Goal 2 (Week 2): Pt will consume least restrictive diet with min assist for use of swallowing precautions and minimal overt s/s of aspiration.  SLP Short Term Goal 2 - Progress (Week 2): Progressing toward goal SLP Short Term Goal 3 (Week 2): Pt will sustain his attention to a basic, functional task for ~10 minutes with min cues for redirection.   SLP Short Term Goal 3 - Progress (Week 2): Met SLP Short Term Goal 4 (Week 2): Pt will complete basic, familiar tasks with min assist for functional problem solving.  SLP Short Term Goal 4 - Progress (Week 2): Progressing toward goal SLP Short Term Goal 5 (Week 2): Pt will return demonstration of at least 2 safety precautions with mod assist multimodal cues.   SLP Short Term Goal 5 - Progress (Week 2): Progressing toward goal SLP Short Term Goal 6 (Week 2): Pt will recall daily information with mod assist multimodal cues for use of external aids.   SLP Short Term Goal 6 - Progress (Week 2): Progressing toward goal  Skilled Therapeutic Interventions:    Skilled treatment session #1 focused on completion of MBS. See results in notes section.  Skilled treatment session #2 focused on education on results of MBS and implications for upgrade to thin liquids.  Pt with multiple questions regarding diet. Time spent answering questions. All questions answered to pt's satisfaction.   Skilled treatment session #2 focused on education with pt and his daughter. Daughter present in pt's room as SLP walked by. Time spent educating daughter on results of MBS and recommended for thin liquids. Time spent educating pt and daughter on nature of swallow dysfunction as result of prolonged intubation, clarity provided and all questions answered to pt and daughter's satisfaction. Information posted in room and nursing made aware of upgrade to thin liquids.   Pain Pain Assessment Pain Scale: 0-10 Pain Score: 0-No pain  Therapy/Group: Individual Therapy  [name] 05/15/2018, 11:33 AM

## 2018-05-15 NOTE — Progress Notes (Signed)
Physical Therapy Session Note  Patient Details  Name: Paul [name] MRN: [id number] Date of Birth: 1950/11/28  Today's Date: 05/15/2018 PT Individual Time: 4585-9292 PT Individual Time Calculation (min): 32 min   Short Term Goals: Week 2:  PT Short Term Goal 1 (Week 2): Pt will ambulate 9ft with CGA and LRAD  PT Short Term Goal 2 (Week 2): Pt will propell >100dft without rest break to improve cardiovascular endurance.  PT Short Term Goal 3 (Week 2): Pt will ascend 2 step with UE support and CGA assist  PT Short Term Goal 4 (Week 2): Pt will perform bed mobiltiy with supervision assist   Skilled Therapeutic Interventions/Progress Updates: Pt presented at EOB agreeable to therapy. C/O some ms soreness in quads. Pt also indicated that he was been progressing with stairs and has been purposefully working on squats to facilitate picking things up at home. Discussed DOMS and if permissible to increase fluid intake to flush lactic acic buildup. Session thus focused on standing tolerance and dynamic balance with Wii. Pt performed stand pivot transfer to w/c CGA and transported to day room total A for energy conservation. Pt participated in Wii bowling x 2 games in standing with single UE support on RW. Pt was able to complete x 10 frames (complete game) in standing without rest break and was able to perform small lunges while playing in simulation of real game. Vitals check between games with SpO2 99% and HR 95. After second game pt then handed off to [facility] PT for following session.       Therapy Documentation Precautions:  Precautions Precautions: Fall Precaution Comments: Supplemental 02 Restrictions Weight Bearing Restrictions: No General:   Vital Signs: Therapy Vitals Temp: 98 F (36.7 C) Temp Source: Oral Pulse Rate: 86 Resp: 19 BP: 136/84 Patient Position (if appropriate): Lying Oxygen Therapy SpO2: 93 % O2 Device: Nasal Cannula O2 Flow Rate (L/min): 3 L/min Pain: Pain  Assessment Pain Scale: 0-10 Pain Score: 0-No pain    Therapy/Group: Individual Therapy  Jaciel Diem  Miran Kautzman, PTA  05/15/2018, 12:07 PM

## 2018-05-15 NOTE — Progress Notes (Signed)
[facility] PHYSICAL MEDICINE & REHABILITATION PROGRESS NOTE   Subjective/Complaints: Pt up in bed. Happy with progress overall. Still focuses on lack of sleep.   ROS: Patient denies fever, rash, sore throat, blurred vision, nausea, vomiting, diarrhea, cough, shortness of breath or chest pain, joint or back pain, headache, or mood change.    Objective:   No results found. No results for input(s): WBC, HGB, HCT, PLT in the last 72 hours. No results for input(s): NA, K, CL, CO2, GLUCOSE, BUN, CREATININE, CALCIUM in the last 72 hours.  Intake/Output Summary (Last 24 hours) at 05/15/2018 0830 Last data filed at 05/15/2018 0452 Gross per 24 hour  Intake 720 ml  Output 1425 ml  Net -705 ml     Physical Exam: Vital Signs Blood pressure 128/70, pulse 66, temperature 97.7 F (36.5 C), temperature source Oral, resp. rate 18, height 5\' 6"  (1.676 m), weight 76.8 kg, SpO2 98 %. Constitutional: No distress . Vital signs reviewed. HEENT: EOMI, oral membranes moist Neck: supple Cardiovascular: RRR without murmur. No JVD    Respiratory: CTA Bilaterally without wheezes or rales. Normal effort, o2 [place]    GI: BS +, non-tender, non-distended  Musc: No edema in extremities.    Skin: Intact.  Warm and dry. Neurological:  Alert and oriented, except for date of month.   Motor: Grossly 4-/5 throughout, unchanged. Good trunk control Psych: Flat.  Assessment/Plan: 1. Functional deficits secondary to hypoxic encephalopathy and debility which require 3+ hours per day of interdisciplinary therapy in a comprehensive inpatient rehab setting.  Physiatrist is providing close team supervision and 24 hour management of active medical problems listed below.  Physiatrist and rehab team continue to assess barriers to discharge/monitor patient progress toward functional and medical goals  Care Tool:  Bathing    Body parts bathed by patient: Right arm, Left arm, Chest, Abdomen, Right upper leg, Left upper leg,  Right lower leg, Left lower leg, Face, Front perineal area, Buttocks   Body parts bathed by helper: Front perineal area, Buttocks     Bathing assist Assist Level: Contact Guard/Touching assist     Upper Body Dressing/Undressing Upper body dressing   What is the patient wearing?: Pull over shirt    Upper body assist Assist Level: Supervision/Verbal cueing    Lower Body Dressing/Undressing Lower body dressing      What is the patient wearing?: Pants     Lower body assist Assist for lower body dressing: Contact Guard/Touching assist     Toileting Toileting Toileting Activity did not occur (Clothing management and hygiene only): (incontinent care)  Toileting assist Assist for toileting: Moderate Assistance - Patient 50 - 74%     Transfers Chair/bed transfer  Transfers assist  Chair/bed transfer activity did not occur: Safety/medical concerns  Chair/bed transfer assist level: Contact Guard/Touching assist     Locomotion Ambulation   Ambulation assist   Ambulation activity did not occur: Safety/medical concerns  Assist level: Contact Guard/Touching assist Assistive device: Walker-rolling Max distance: 100'   Walk 10 feet activity   Assist  Walk 10 feet activity did not occur: Safety/medical concerns  Assist level: Contact Guard/Touching assist Assistive device: Walker-rolling   Walk 50 feet activity   Assist Walk 50 feet with 2 turns activity did not occur: Safety/medical concerns  Assist level: Contact Guard/Touching assist Assistive device: Walker-rolling    Walk 150 feet activity   Assist Walk 150 feet activity did not occur: Safety/medical concerns         Walk 10 feet  on uneven surface  activity   Assist Walk 10 feet on uneven surfaces activity did not occur: Safety/medical concerns         Wheelchair     Assist Will patient use wheelchair at discharge?: Yes Type of Wheelchair: Manual    Wheelchair assist level:  Supervision/Verbal cueing Max wheelchair distance: 111ft    Wheelchair 50 feet with 2 turns activity    Assist    Wheelchair 50 feet with 2 turns activity did not occur: Safety/medical concerns   Assist Level: Supervision/Verbal cueing   Wheelchair 150 feet activity     Assist Wheelchair 150 feet activity did not occur: Safety/medical concerns   Assist Level: Supervision/Verbal cueing     Medical Problem List and Plan: 1.  Encephalopathy/debility secondary to  Acute hypoxic respiratory failure/RUL pneumonia complicated by history of COPD oxygen dependent. Continue nebulizers as directed  Continue CIR 2.  DVT Prophylaxis/Anticoagulation: Lovenox. Monitor for any bleeding episodes 3. Pain Management:  Tylenol as needed 4. Mood/delirium:  Provide emotional support 5. Neuropsych: This patient is  not capable of making decisions on his own behalf. 6. Skin/Wound Care:   Added Diflucan 100 mg p.o. twice daily x3 (completed on 1/21) days for inguinal rash 7. Fluids/Electrolytes/Nutrition:  Routine in and out's   Labs pending today 1/29 8. Dysphagia.Cortrak tube: Swallow study 05/03/2018 dysphagia #1 honey liquids.  DCed NG  IVF nightly DC'd on 1/28, monitor oral fluid intake 9. ID/Strep Pneumo Bacteremia.  Completed ceftriaxone on 05/05/2018  10. CAD with stenting. Plavix. 11. Hypertension. Clonidine 0.1 mg daily, Toprol 50 mg daily  Relatively controlled on 1/29 12. Diabetes mellitus with peripheral neuropathy. Levemir BID. Checking blood sugars before meals and at bedtime. Patient on Glucophage 500 mg daily prior to admission             Reduced Levemir to 10 units twice daily as we convert to orals alone, DC'd on 1/20  Glucophage to 500 mg daily started on 1/22  Fair control 13. History of gout. Monitor for any flareups 14. Constipation. Laxative assistance.  15.  Acute blood loss anemia  Hemoglobin 11.4 on 1/20   Labs pending today  Continue to monitor 16.   Hypoalbuminemia  Supplement initiated 17.  Sleep disturbance  Seroquel DC'd  Melatonin started on 1/20  Ambien started on 1/21, increased on 1/23, DC'd on 1/27  Restoril started on 1/27, increased on 1/28---continue to observe  -not sure we're going to find an "answer" to his chronic insomnia. Was on cocktail of meds PTA 18.  Prolonged QTC  ECG reviewed  Monitor with medications  LOS: 12 days A FACE TO FACE EVALUATION WAS PERFORMED  Meredith Staggers 05/15/2018, 8:30 AM

## 2018-05-15 NOTE — Progress Notes (Signed)
Physical Therapy Session Note  Patient Details  Name: Paul [name] MRN: [id number] Date of Birth: 09/24/1950  Today's Date: 05/15/2018 PT Individual Time: 1137-1205 PT Individual Time Calculation (min): 28 min   Short Term Goals: Week 2:  PT Short Term Goal 1 (Week 2): Pt will ambulate 26ft with CGA and LRAD  PT Short Term Goal 2 (Week 2): Pt will propell >100dft without rest break to improve cardiovascular endurance.  PT Short Term Goal 3 (Week 2): Pt will ascend 2 step with UE support and CGA assist  PT Short Term Goal 4 (Week 2): Pt will perform bed mobiltiy with supervision assist   Skilled Therapeutic Interventions/Progress Updates:    Pt received in dayroom in W/C with PTA. Pushed in W/C totalA due to time constraints. Pt previously expressed concern with picking up object from floor, so practiced picking up cups from different height surfaces using RW and CGA for balance. Started with 2 ft high platform, cups upside down- pt able to bend to pick up cups and regain upright position with no physical assistance. Decreased to 1.5 ft tall platform, then 9 inch platform, then the floor, pt able to pick up cups and regain upright with CGA for balance. Rest breaks required between each round of picking up cups due to LE fatigue, on last rep of last set, pt sat back down without standing fully upright. Pt ambulated 50 ft, 30 ft using RW with CGA and rest break. Propelled W/C back to room 30 ft, no physical assist. Pt left sitting in W/C with nurse tech in the room.  Therapy Documentation Precautions:  Precautions Precautions: Fall Precaution Comments: Supplemental 02 Restrictions Weight Bearing Restrictions: No General:   Vital Signs: Therapy Vitals Temp: 98 F (36.7 C) Temp Source: Oral Pulse Rate: 86 Resp: 19 BP: 136/84 Patient Position (if appropriate): Lying Oxygen Therapy SpO2: 93 % O2 Device: Nasal Cannula O2 Flow Rate (L/min): 3 L/min Pain: Pain Assessment Pain Scale:  Faces Pain Score: 0-No pain Faces Pain Scale: No hurt Pain Location: Leg Pain Orientation: Right;Left Pain Descriptors / Indicators: Discomfort Pain Intervention(s): Ambulation/increased activity      Therapy/Group: Individual Therapy   Ronnell Gu[place]ajara, SPT  05/15/2018, 12:55 PM

## 2018-05-16 ENCOUNTER — Inpatient Hospital Stay (HOSPITAL_COMMUNITY): Payer: Medicare Other | Admitting: Physical Therapy

## 2018-05-16 ENCOUNTER — Inpatient Hospital Stay (HOSPITAL_COMMUNITY): Payer: Medicare Other | Admitting: Speech Pathology

## 2018-05-16 ENCOUNTER — Inpatient Hospital Stay (HOSPITAL_COMMUNITY): Payer: Medicare Other | Admitting: Occupational Therapy

## 2018-05-16 LAB — GLUCOSE, CAPILLARY
Glucose-Capillary: 126 mg/dL — ABNORMAL HIGH (ref 70–99)
Glucose-Capillary: 153 mg/dL — ABNORMAL HIGH (ref 70–99)
Glucose-Capillary: 169 mg/dL — ABNORMAL HIGH (ref 70–99)
Glucose-Capillary: 157 mg/dL — ABNORMAL HIGH (ref 70–99)

## 2018-05-16 NOTE — Progress Notes (Signed)
Social Work Patient ID: Paul [name], male   DOB: March 14, 1951, 68 y.o.   MRN: [id number]   Met this afternoon with pt and daughter, Claiborne Billings, to review team conference.  Both pleased with progress and aware we continue to plan toward 2/5 d/c date.  Family to plan to provide 24/7 supervision initially but aware that this will not likely be a long term need.  Reviewed DME and follow up arrangement still to be ordered.  Cont to follow.  Ventura Hollenbeck, LCSW

## 2018-05-16 NOTE — Progress Notes (Signed)
Physical Therapy Session Note  Patient Details  Name: Paul [name] MRN: [id number] Date of Birth: 1950/12/24  Today's Date: 05/16/2018 PT Individual Time: 3300-7622 PT Individual Time Calculation (min): 45 min   Short Term Goals: Week 2:  PT Short Term Goal 1 (Week 2): Pt will ambulate 16ft with CGA and LRAD  PT Short Term Goal 2 (Week 2): Pt will propell >100dft without rest break to improve cardiovascular endurance.  PT Short Term Goal 3 (Week 2): Pt will ascend 2 step with UE support and CGA assist  PT Short Term Goal 4 (Week 2): Pt will perform bed mobiltiy with supervision assist   Skilled Therapeutic Interventions/Progress Updates:   Pt received lying in bed and agreeable to tx. Pt's daughter present during tx session. Pt transfers supine<>sit and sit<>stand with RW transfer to w/c with supervision. Pt propels w/c with distant supervision throughout unit with directional cuing from PT due to unfamiliarity with environment. Session focused on community ambulation distances and SpO2 monitored throughout session reading between 90%-97% with activity. Pt ambulates with RW and CGA to keep pants up throughout gift shop x 2 with multiple turns and tight spaces demonstrating good awareness, requiring a rest break between 2 trials due to SOB. Pt ambulates around atrium 231 ft with RW and CGA to keep pants up before fatigue. Pt taps 4" step 4 x 5 times for SLS balance and standing endurance with min assist to maintain balance and requires multiple rest breaks due to SOB. Pt left sitting EOB at end of session with bed alarm on and daughter present to supervise with all needs in reach.  Pt c/o SOB throughout session with activity and rest breaks provided with SpO2 monitored. Reports some soreness in R/L upper thighs but declines medication.  Therapy Documentation Precautions:  Precautions Precautions: Fall Precaution Comments: Supplemental 02 Restrictions Weight Bearing Restrictions:  No  Therapy/Group: Individual Therapy  Hutson Luft 05/16/2018, 3:58 PM

## 2018-05-16 NOTE — Progress Notes (Signed)
Occupational Therapy Weekly Progress Note  Patient Details  Name: Paul [name] MRN: [id number] Date of Birth: February 21, 1951  Beginning of progress report period: May 10, 2018 End of progress report period: May 16, 2018  Today's Date: 05/16/2018 OT Individual Time: 4097-3532 OT Individual Time Calculation (min): 44 min    Patient has met 4 of 4 short term goals.  Mr. Wingert continues to make excellent progress with OT at this time.  He has reached a solid min assist level for selfcare sit to stand as well as for toilet and walk-in shower transfers using the RW.  His endurance is still limited and he needs frequent rest breaks, but he can tolerate standing at the sink for 1-2 min intervals during grooming tasks.  Oxygen sats are remaining above 90% as well and he is demonstrating anticipatory awareness by waiting on therapist to be with him before he attempts sit to stand during ADL.Marland Kitchen  Based on current progress will update some of pt's goals to modified independent level to help reduce burden of care at discharge with anticipated discharge date of 1/30.    Patient continues to demonstrate the following deficits: muscle weakness, decreased cardiorespiratoy endurance and decreased standing balance and decreased balance strategies and therefore will continue to benefit from skilled OT intervention to enhance overall performance with BADL and Reduce care partner burden.  Patient progressing toward long term goals..  Plan of care revisions: some LTGs updated.  OT Short Term Goals Week 3:  OT Short Term Goal 1 (Week 3): Pt will continue working toward updated LTGs at supervision to modified independent.  Skilled Therapeutic Interventions/Progress Updates:    Pt worked on bathing, dressing, and grooming tasks during session.  Supervision for ambulation to the shower with the RW.  He then completed shower with supervision as well as dressing sit to stand from the EOB.  He stood at the sink to comb his  hair and then sat for oral hygiene secondary to fatigue.  Standing endurance for this session was approximately one minute.  Oxygen sats were 95-96% on room air during ADL.  Finished session with pt in the wheelchair with call button and phone in reach.    Therapy Documentation Precautions:  Precautions Precautions: Fall Precaution Comments:   Restrictions Weight Bearing Restrictions: No   Vital Signs: Therapy Vitals Temp: 97.8 F (36.6 C) Pulse Rate: 85 Resp: 18 BP: 117/74 Patient Position (if appropriate): Sitting Oxygen Therapy SpO2: 96 % O2 Device: Room Air Pain: Pain Assessment Pain Scale: 0-10 Pain Score: 0-No pain Faces Pain Scale: No hurt Pain Type: Acute pain Pain Location: Leg Pain Orientation: Right;Left Pain Descriptors / Indicators: Aching Pain Onset: On-going Pain Intervention(s): Medication (See eMAR) ADL: See Care Tool section for some details of ADL  Therapy/Group: Individual Therapy  Glenda Kunst OTR/L 05/16/2018, 4:03 PM

## 2018-05-16 NOTE — Patient Care Conference (Signed)
Inpatient RehabilitationTeam Conference and Plan of Care Update Date: 05/15/2018   Time: 11:35 AM    Patient Name: Paul [name]      Medical Record Number: [id number]  Date of Birth: 1950/04/25 Sex: Male         Room/Bed: 4M05C/4M05C-01 Payor Info: Payor: MEDICARE / Plan: MEDICARE PART A AND B / Product Type: *No Product type* /    Admitting Diagnosis: Enchephalopathy  Admit Date/Time:  05/03/2018  5:08 PM Admission Comments: No comment available   Primary Diagnosis:  <principal problem not specified> Principal Problem: <principal problem not specified>  Patient Active Problem List   Diagnosis Date Noted  . Prolonged QT interval   . Labile blood glucose   . Type 2 diabetes mellitus with peripheral neuropathy (HCC)   . Labile blood pressure   . Sleep disturbance   . Hypoalbuminemia due to protein-calorie malnutrition ([place])   . Poorly controlled type 2 diabetes mellitus with peripheral neuropathy ([place])   . Dysphagia   . Candidiasis   . Acute encephalopathy 05/03/2018  . Pressure injury of skin 04/30/2018  . Coronary artery disease involving native coronary artery of native heart without angina pectoris   . Tobacco abuse   . Hypertension   . Acute blood loss anemia   . Bacteremia   . Respiratory failure ([place])   . CAP (community acquired pneumonia) 04/21/2018  . Corneal irritation of both eyes 10/31/2017  . Allergic rhinitis due to animal hair and dander 10/31/2017  . OSA on CPAP 10/10/2016  . OSA and COPD overlap syndrome ([place]) 03/02/2016  . Bronchitis, chronic obstructive, with exacerbation ([place]) 03/02/2016  . Hypoxemia 03/02/2016  . Dependence on supplemental oxygen 03/02/2016  . CAD, multiple vessel 03/02/2016  . Gastroesophageal reflux disease 03/02/2016  . HTN (hypertension), malignant 03/02/2016  . Cigarette smoker 02/22/2014  . DOE (dyspnea on exertion) 01/20/2014  . Obesity (BMI 30.0-34.9)   . Essential hypertension 11/24/2011  . Hyperlipidemia with target LDL less  than 70 11/24/2011  . OSA (obstructive sleep apnea), uses oxygen at home did not tolerate cpap 11/24/2011  . COPD GOLD I  11/24/2011  . CAD S/P percutaneous coronary angioplasty -- D1, Xience Xpedition DES 2.5 mm x 33 mm 11/24/2011    Expected Discharge Date: Expected Discharge Date: 05/22/18  Team Members Present: Physician leading conference: Dr. Alysia Penna Social Worker Present: Lennart Pall, LCSW Nurse Present: Dorien Chihuahua, RN PT Present: Barrie Folk, PT OT Present: Clyda Greener, OT SLP Present: Stormy Fabian, SLP PPS Coordinator present : Ileana Ladd, PT     Current Status/Progress Goal Weekly Team Focus  Medical   improving cognitively. on diet/liquids. stabile respiratory, glucose control improving, sleep issues  see prior  sleep, electrolytes, nutrition   Bowel/Bladder   continent of bowel and bladder, LBM 05-14-18  remain continent of bowel and  bladder   Assist patient with toileting needs prn   Swallow/Nutrition/ Hydration   dys 2, honey, tolerating thin liquid trials on water protocol, MBS scheduled 1/29 to assess readiness to advance liquids  supervision  assess readiness for potential liquid upgrade, trial D3   ADL's   Supervision for UB and LB selfcare sit to stand.  Min guard for transfers to the toilet and the walk-in shower.  Decreased standing endurance around one minutek  supervision overall  selfcare retraining, balance retraining, endurance training, neuromuscular re-education, DME/AE education   Mobility   CGA-supervision assist with all mobility including bed mobility, transfers, gait, WC mobility, and stairs   Mod I  to supervision assist with LRAD at ambulatory level   improved endurance, safety with gait, transfers, and bed mobility. increased strength in BLE to allow improved function with household and community level mobility.    Communication             Safety/Cognition/ Behavioral Observations  mod assist  min assist  work on basic  problem solving, memory, attention   Pain   BLE pain managed with prn tylenol  <3  Assess pain q shift and treat prn   Skin   MASD to the groin with barrier cream   maintain skin integrity with min assist  Assess skin q shift and prn    Rehab Goals Patient on target to meet rehab goals: Yes *See Care Plan and progress notes for long and short-term goals.     Barriers to Discharge  Current Status/Progress Possible Resolutions Date Resolved   Physician    Medical stability        ongoing medical mgt as outlined in chart      Nursing                  PT                    OT                  SLP                SW                Discharge Planning/Teaching Needs:  Pt to d/c home and family arranging 24/7 support   Teaching to be planned closer to d/c.   Team Discussion:  Medically improved.  IVF stopped and weaning off of O2.  Upgraded to D2, thin.  Supervision to min/guard overall with ADLs and mobility.  Endurance still poor but improving.  Revisions to Treatment Plan:  NA    Continued Need for Acute Rehabilitation Level of Care: The patient requires daily medical management by a physician with specialized training in physical medicine and rehabilitation for the following conditions: Daily direction of a multidisciplinary physical rehabilitation program to ensure safe treatment while eliciting the highest outcome that is of practical value to the patient.: Yes Daily medical management of patient stability for increased activity during participation in an intensive rehabilitation regime.: Yes Daily analysis of laboratory values and/or radiology reports with any subsequent need for medication adjustment of medical intervention for : Neurological problems;Pulmonary problems;Mood/behavior problems;Nutritional problems   I attest that I was present, lead the team conference, and concur with the assessment and plan of the team.   Neema Barreira 05/17/2018, 9:44 AM

## 2018-05-16 NOTE — Progress Notes (Signed)
[place] PHYSICAL MEDICINE & REHABILITATION PROGRESS NOTE   Subjective/Complaints: Daily discussion re: poor sleep. Also states his left thigh feels "warm"  ROS: Patient denies fever, rash, sore throat, blurred vision, nausea, vomiting, diarrhea, cough, shortness of breath or chest pain, joint or back pain, headache, or mood change.    Objective:   Dg Swallowing Func-speech Pathology  Result Date: 05/15/2018 Objective Swallowing Evaluation: Type of Study: MBS-Modified Barium Swallow Study  Patient Details Name: Paul [name] MRN: [id number] Date of Birth: Sep 01, 1950 Today's Date: 05/15/2018 Time: SLP Start Time (ACUTE ONLY): 1330 -SLP Stop Time (ACUTE ONLY): 1346 SLP Time Calculation (min) (ACUTE ONLY): 16 min Past Medical History: Past Medical History: Diagnosis Date . CAD S/P percutaneous coronary angioplasty -- D1, Xience Xpedition DES 2.5 mm x 33 mm 11/24/2011  a) Mild-to-moderate 30-40% lesions in the RCA, LAD and Circumflex. b) CULPRIT LESION: long tubular 70-80% lesion in D1 with FFR of 0.7 --> PCI w/ Xience Xpedition DES 2.5 mm x 30 mm (2.65 MM); c) Lexiscan Myoview 11/2013: No Ischemia or Infarct (Inferior Gut Attenuation) EF 63%. Marland Kitchen COPD (chronic obstructive pulmonary disease) ([place])   "don't have full case of it; I'm right there at it" . Essential hypertension 11/24/2011 . Exertional dyspnea, chronic  . Gout  . History of Unstable angina 11/24/2011  Referred for cardiac catheterization . Hyperlipidemia with target LDL less than 70 11/24/2011 . Obesity (BMI 30.0-34.9)  . OSA (obstructive sleep apnea), uses oxygen at home did not tolerate cpap 11/24/2011 Past Surgical History: Past Surgical History: Procedure Laterality Date . CERVICAL SPINE SURGERY  2012 . CORONARY ANGIOPLASTY WITH STENT PLACEMENT  11/23/2011  "1; first one" . LEFT HEART CATHETERIZATION WITH CORONARY ANGIOGRAM N/A 11/23/2011  Procedure: LEFT HEART CATHETERIZATION WITH CORONARY ANGIOGRAM;  Surgeon: Leonie Man, MD;  Location: [facility] CATH LAB;   Service: Cardiovascular;  Laterality: N/A; HPI: 68 year old man with COPD on home oxygen admitted 1/5 with right upper lobe community-acquired pneumonia requiring mechanical ventilation. Intubated until 1/11.  No data recorded Assessment / Plan / Recommendation CHL IP CLINICAL IMPRESSIONS 05/15/2018 Clinical Impression Pt presents with mild oral phase and mild pharyngeal dysphagia that is primarily sensory in natre. Pt presents with decreased bolus cohesion and mildly increased mastication when consuming soft solid peaches. Pt with delayed swallow intiaiton to pyriform sinuses across nectar thick liquids and thin liquids. Pt's delayed swallow initiation leads to flash penetration with thin liquids. Pt was able to protect airway as penetration squeezed out during swallow. Pt with head thrush up during consumption. Use of a straw facilitated neutral head positioning therefore recommend use of straw. Pt able to consume mixed peaches and thin barium with great airway protection. Whole barium tablet consumed with thin liquids via straw. Recommend upgrading to thin liquids via straw, medicine whole with thin liquids.  SLP Visit Diagnosis Dysphagia, oropharyngeal phase (R13.12) Attention and concentration deficit following -- Frontal lobe and executive function deficit following -- Impact on safety and function Mild aspiration risk   CHL IP TREATMENT RECOMMENDATION 05/15/2018 Treatment Recommendations Therapy as outlined in treatment plan below   No flowsheet data found. CHL IP DIET RECOMMENDATION 05/15/2018 SLP Diet Recommendations Dysphagia 2 (Fine chop) solids;Thin liquid Liquid Administration via Cup;Straw Medication Administration Whole meds with liquid Compensations Minimize environmental distractions;Slow rate;Small sips/bites Postural Changes Seated upright at 90 degrees   CHL IP OTHER RECOMMENDATIONS 05/15/2018 Recommended Consults -- Oral Care Recommendations Oral care BID Other Recommendations --   CHL IP FOLLOW UP  RECOMMENDATIONS 05/03/2018 Follow up Recommendations  Inpatient Rehab   No flowsheet data found.     CHL IP ORAL PHASE 05/15/2018 Oral Phase Impaired Oral - Pudding Teaspoon -- Oral - Pudding Cup -- Oral - Honey Teaspoon NT Oral - Honey Cup -- Oral - Nectar Teaspoon Decreased bolus cohesion;Premature spillage Oral - Nectar Cup Decreased bolus cohesion;Premature spillage Oral - Nectar Straw -- Oral - Thin Teaspoon Decreased bolus cohesion;Premature spillage Oral - Thin Cup Decreased bolus cohesion;Premature spillage Oral - Thin Straw Premature spillage Oral - Puree NT Oral - Mech Soft -- Oral - Regular -- Oral - Multi-Consistency Premature spillage;Decreased bolus cohesion Oral - Pill Premature spillage Oral Phase - Comment --  CHL IP PHARYNGEAL PHASE 05/15/2018 Pharyngeal Phase Impaired Pharyngeal- Pudding Teaspoon -- Pharyngeal -- Pharyngeal- Pudding Cup -- Pharyngeal -- Pharyngeal- Honey Teaspoon NT Pharyngeal -- Pharyngeal- Honey Cup -- Pharyngeal -- Pharyngeal- Nectar Teaspoon Delayed swallow initiation-pyriform sinuses Pharyngeal Material does not enter airway Pharyngeal- Nectar Cup Delayed swallow initiation-pyriform sinuses Pharyngeal Material does not enter airway Pharyngeal- Nectar Straw -- Pharyngeal -- Pharyngeal- Thin Teaspoon Delayed swallow initiation-pyriform sinuses;Penetration/Aspiration during swallow Pharyngeal Material enters airway, remains ABOVE vocal cords then ejected out Pharyngeal- Thin Cup Delayed swallow initiation-pyriform sinuses;Penetration/Aspiration during swallow Pharyngeal Material enters airway, remains ABOVE vocal cords then ejected out Pharyngeal- Thin Straw Delayed swallow initiation-pyriform sinuses Pharyngeal -- Pharyngeal- Puree NT Pharyngeal -- Pharyngeal- Mechanical Soft -- Pharyngeal -- Pharyngeal- Regular -- Pharyngeal -- Pharyngeal- Multi-consistency Delayed swallow initiation-pyriform sinuses;Penetration/Aspiration during swallow Pharyngeal Material enters airway, remains  ABOVE vocal cords then ejected out Pharyngeal- Pill Delayed swallow initiation-pyriform sinuses;Penetration/Aspiration during swallow Pharyngeal Material enters airway, remains ABOVE vocal cords then ejected out Pharyngeal Comment --  CHL IP CERVICAL ESOPHAGEAL PHASE 05/15/2018 Cervical Esophageal Phase WFL Pudding Teaspoon -- Pudding Cup -- Honey Teaspoon -- Honey Cup -- Nectar Teaspoon -- Nectar Cup -- Nectar Straw -- Thin Teaspoon -- Thin Cup -- Thin Straw -- Puree -- Mechanical Soft -- Regular -- Multi-consistency -- Pill -- Cervical Esophageal Comment -- Paul [name] 05/15/2018, 11:25 AM              Recent Labs    05/15/18 0853  WBC 7.6  HGB 12.6*  HCT 40.0  PLT 247   Recent Labs    05/15/18 0853  NA 136  K 4.0  CL 100  CO2 27  GLUCOSE 143*  BUN 16  CREATININE 0.75  CALCIUM 9.1    Intake/Output Summary (Last 24 hours) at 05/16/2018 0853 Last data filed at 05/16/2018 0818 Gross per 24 hour  Intake 480 ml  Output 2700 ml  Net -2220 ml     Physical Exam: Vital Signs Blood pressure (!) 107/53, pulse 68, temperature 98.6 F (37 C), resp. rate 16, height 5\' 6"  (1.676 m), weight 76.8 kg, SpO2 93 %. Constitutional: No distress . Vital signs reviewed. HEENT: EOMI, oral membranes moist Neck: supple Cardiovascular: RRR without murmur. No JVD    Respiratory: CTA Bilaterally without wheezes or rales. Normal effort    GI: BS +, non-tender, non-distended  Musc: No edema in extremities.    Skin: Intact.  Warm and dry. Neurological:  Alert and oriented, except for date of month.   Motor: Grossly 4-/5 throughout, unchanged. Good trunk control Psych: Flat.  Assessment/Plan: 1. Functional deficits secondary to hypoxic encephalopathy and debility which require 3+ hours per day of interdisciplinary therapy in a comprehensive inpatient rehab setting.  Physiatrist is providing close team supervision and 24 hour management of active medical problems listed below.  Physiatrist and  rehab team continue to assess barriers to discharge/monitor patient progress toward functional and medical goals  Care Tool:  Bathing    Body parts bathed by patient: Right arm, Left arm, Chest, Abdomen, Right upper leg, Left upper leg, Right lower leg, Left lower leg, Face, Front perineal area, Buttocks   Body parts bathed by helper: Front perineal area, Buttocks     Bathing assist Assist Level: Supervision/Verbal cueing     Upper Body Dressing/Undressing Upper body dressing   What is the patient wearing?: Pull over shirt    Upper body assist Assist Level: Supervision/Verbal cueing    Lower Body Dressing/Undressing Lower body dressing      What is the patient wearing?: Pants     Lower body assist Assist for lower body dressing: Supervision/Verbal cueing     Toileting Toileting Toileting Activity did not occur (Clothing management and hygiene only): (incontinent care)  Toileting assist Assist for toileting: Moderate Assistance - Patient 50 - 74%     Transfers Chair/bed transfer  Transfers assist  Chair/bed transfer activity did not occur: Safety/medical concerns  Chair/bed transfer assist level: Contact Guard/Touching assist     Locomotion Ambulation   Ambulation assist   Ambulation activity did not occur: Safety/medical concerns  Assist level: Contact Guard/Touching assist Assistive device: Walker-rolling Max distance: 175 ft   Walk 10 feet activity   Assist  Walk 10 feet activity did not occur: Safety/medical concerns  Assist level: Contact Guard/Touching assist Assistive device: Walker-rolling   Walk 50 feet activity   Assist Walk 50 feet with 2 turns activity did not occur: Safety/medical concerns  Assist level: Contact Guard/Touching assist Assistive device: Walker-rolling    Walk 150 feet activity   Assist Walk 150 feet activity did not occur: Safety/medical concerns         Walk 10 feet on uneven surface  activity   Assist  Walk 10 feet on uneven surfaces activity did not occur: Safety/medical concerns         Wheelchair     Assist Will patient use wheelchair at discharge?: Yes Type of Wheelchair: Manual    Wheelchair assist level: Set up assist Max wheelchair distance: 100 ft    Wheelchair 50 feet with 2 turns activity    Assist    Wheelchair 50 feet with 2 turns activity did not occur: Safety/medical concerns   Assist Level: Set up assist   Wheelchair 150 feet activity     Assist Wheelchair 150 feet activity did not occur: Safety/medical concerns   Assist Level: Supervision/Verbal cueing     Medical Problem List and Plan: 1.  Encephalopathy/debility secondary to  Acute hypoxic respiratory failure/RUL pneumonia complicated by history of COPD oxygen dependent. Continue nebulizers as directed  Continue CIR 2.  DVT Prophylaxis/Anticoagulation: Lovenox. Monitor for any bleeding episodes 3. Pain Management:  Tylenol as needed 4. Mood/delirium:  Provide emotional support 5. Neuropsych: This patient is  not capable of making decisions on his own behalf. 6. Skin/Wound Care:   Added Diflucan 100 mg p.o. twice daily x3 (completed on 1/21) days for inguinal rash 7. Fluids/Electrolytes/Nutrition:  Routine in and out's   Labs reviewed 8. Dysphagia.Cortrak tube: Swallow study 05/03/2018 dysphagia #1 honey liquids.  DCed NG  IVF nightly DC'd on 1/28, monitor oral fluid intake 9. ID/Strep Pneumo Bacteremia.  Completed ceftriaxone on 05/05/2018  10. CAD with stenting. Plavix. 11. Hypertension. Clonidine 0.1 mg daily, Toprol 50 mg daily  Relatively controlled on 1/30 12. Diabetes mellitus with peripheral neuropathy. Levemir BID.  Checking blood sugars before meals and at bedtime. Patient on Glucophage 500 mg daily prior to admission             Reduced Levemir to 10 units twice daily as we convert to orals alone, DC'd on 1/20  Glucophage to 500 mg daily started on 1/22  Fair control at  present. Consider incr of metformin to bid 13. History of gout. Monitor for any flareups 14. Constipation. Laxative assistance.  15.  Acute blood loss anemia  Hemoglobin 11.4 on 1/20      Continue to monitor 16.  Hypoalbuminemia  Supplement initiated 17.  Sleep disturbance  Seroquel DC'd  Melatonin started on 1/20  Ambien started on 1/21, increased on 1/23, DC'd on 1/27  Restoril started on 1/27, increased on 1/28---continue to observe  -not sure we're going to find an "answer" to his chronic insomnia. Was on cocktail of meds PTA apparently.   -encouraged pt to work on being up and oob as much as possible during the day. 18.  Prolonged QTC  ECG reviewed  Monitor with medications  LOS: 13 days A FACE TO FACE EVALUATION WAS PERFORMED  Meredith Staggers 05/16/2018, 8:53 AM

## 2018-05-16 NOTE — Progress Notes (Signed)
Speech Language Pathology Daily Session Note  Patient Details  Name: Paul [name] MRN: [id number] Date of Birth: 07/27/1950  Today's Date: 05/16/2018 SLP Individual Time: 5208-0223 SLP Individual Time Calculation (min): 45 min  Short Term Goals: Week 2: SLP Short Term Goal 1 (Week 2): Pt will complete instrumental swallow exam to assess potential for liquid advancement. SLP Short Term Goal 1 - Progress (Week 2): Progressing toward goal SLP Short Term Goal 2 (Week 2): Pt will consume least restrictive diet with min assist for use of swallowing precautions and minimal overt s/s of aspiration.  SLP Short Term Goal 2 - Progress (Week 2): Progressing toward goal SLP Short Term Goal 3 (Week 2): Pt will sustain his attention to a basic, functional task for ~10 minutes with min cues for redirection.   SLP Short Term Goal 3 - Progress (Week 2): Met SLP Short Term Goal 4 (Week 2): Pt will complete basic, familiar tasks with min assist for functional problem solving.  SLP Short Term Goal 4 - Progress (Week 2): Progressing toward goal SLP Short Term Goal 5 (Week 2): Pt will return demonstration of at least 2 safety precautions with mod assist multimodal cues.   SLP Short Term Goal 5 - Progress (Week 2): Progressing toward goal SLP Short Term Goal 6 (Week 2): Pt will recall daily information with mod assist multimodal cues for use of external aids.   SLP Short Term Goal 6 - Progress (Week 2): Progressing toward goal  Skilled Therapeutic Interventions: Pt was seen for skilled ST targeting dysphagia and cognition. Pt presented with significantly improved motivation to participate in therapy today in comparison to this ST's previous visit. He recalled and demonstrated use of recommended safe swallow strategies with supervision verbal cues during consumption of thin liquid and upgraded dysphagia 3 trial. Pt exhibited immediate cough X2 with thin; self-monitoring of safe swallow strategies was evident when pt  independently associated taking too big of a sip with cough response. SLP facilitated rest of session with a functional semi-complex problem solving activity. Pt demonstrated significantly improved ability to sort and count money during simulated transactions of various complexities. He required no more than min assist verbal cues throughout the task. Pt was left in bed with bed alarm set, call bell within reach, and all needs met. Recommend upgrade diet to dysphagia 3, continue thin liquids.     Pain Pain Assessment Pain Scale: Faces Faces Pain Scale: No hurt  Therapy/Group: Individual Therapy   Jettie Booze, Student SLP   Jettie Booze 05/16/2018, 12:15 PM

## 2018-05-16 NOTE — Progress Notes (Signed)
Physical Therapy Session Note  Patient Details  Name: Paul [name] MRN: [id number] Date of Birth: October 27, 1950  Today's Date: 05/16/2018 PT Individual Time: 0905-1000 PT Individual Time Calculation (min): 55 min   Short Term Goals: Week 2:  PT Short Term Goal 1 (Week 2): Pt will ambulate 52ft with CGA and LRAD  PT Short Term Goal 2 (Week 2): Pt will propell >100dft without rest break to improve cardiovascular endurance.  PT Short Term Goal 3 (Week 2): Pt will ascend 2 step with UE support and CGA assist  PT Short Term Goal 4 (Week 2): Pt will perform bed mobiltiy with supervision assist   Skilled Therapeutic Interventions/Progress Updates:   Pt received sitting EOB finishing with breathing tx and agreeable to PT. Pt dons shoes with supervision and set-up assist.  Pt sits<>stands with supervision and ambulates to bathroom with RW and CGA. Toilet transfer with min assist to maintain balance while pulling down/up pants. Pt BM and requires PT assistance for hygiene tasks. SpO2 readings between 90%-97% throughout session with ambulation and stair activity. Pt ambulates for endurance 208 ft with RW and min CGA to keep pants up while walking before fatigue. Pt ascends/descends 12 stairs for endurance and BLE strengthening with self-selected step-over-step pattern with 2 hand rails and no evidence of knee instability or loss of balance, rest breaks provided after every 4 stairs. Pt performs lateral stepping 30 ft + 30 ft CGA without BUE use for dynamic balance with high level ambulation and bilateral hip strengthening with rest break provided. Pt wheeled back to room total assist at end of session secondary to fatigue. Pt left sitting up in w/c with chair alarm donned and all needs in reach.   Pt c/o muscle soreness in upper thighs and "stopped up" head and nurse provided medication. Pt c/o SOB after ambulation and stair activities and rest breaks provided and SpO2 monitored throughout session.  Therapy  Documentation Precautions:  Precautions Precautions: Fall Precaution Comments: Supplemental 02 Restrictions Weight Bearing Restrictions: No   Therapy/Group: Individual Therapy  Odel Schmid 05/16/2018, 1:06 PM

## 2018-05-17 ENCOUNTER — Inpatient Hospital Stay (HOSPITAL_COMMUNITY): Payer: Medicare Other | Admitting: Physical Therapy

## 2018-05-17 ENCOUNTER — Inpatient Hospital Stay (HOSPITAL_COMMUNITY): Payer: Medicare Other | Admitting: Speech Pathology

## 2018-05-17 ENCOUNTER — Inpatient Hospital Stay (HOSPITAL_COMMUNITY): Payer: Medicare Other | Admitting: Occupational Therapy

## 2018-05-17 LAB — GLUCOSE, CAPILLARY
Glucose-Capillary: 118 mg/dL — ABNORMAL HIGH (ref 70–99)
Glucose-Capillary: 116 mg/dL — ABNORMAL HIGH (ref 70–99)
Glucose-Capillary: 158 mg/dL — ABNORMAL HIGH (ref 70–99)
Glucose-Capillary: 148 mg/dL — ABNORMAL HIGH (ref 70–99)

## 2018-05-17 MED ORDER — PANTOPRAZOLE SODIUM 40 MG PO TBEC
40.0000 mg | DELAYED_RELEASE_TABLET | Freq: Every day | ORAL | Status: DC
  Administered 2018-05-18 – 2018-05-21 (×4): 40 mg via ORAL
  Filled 2018-05-17 (×4): qty 1

## 2018-05-17 NOTE — Progress Notes (Signed)
Physical Therapy Session Note  Patient Details  Name: Paul [name] MRN: [id number] Date of Birth: [birth year]  Today's Date: 05/17/2018 PT Individual Time: 4401-0272 PT Individual Time Calculation (min): 45 min   Short Term Goals: Week 1:  PT Short Term Goal 1 (Week 1): Pt will complete bed mobility with min assist.  PT Short Term Goal 1 - Progress (Week 1): Met PT Short Term Goal 2 (Week 1): Pt will ambulate 25 ft with LRAD & mod assist. PT Short Term Goal 2 - Progress (Week 1): Met PT Short Term Goal 3 (Week 1): Pt will propel w/c 50 ft without need for rest break for cardiopulmonary endurance training.  PT Short Term Goal 3 - Progress (Week 1): Met PT Short Term Goal 4 (Week 1): Pt will initiate stair training.  PT Short Term Goal 4 - Progress (Week 1): Met Week 2:  PT Short Term Goal 1 (Week 2): Pt will ambulate 11f with CGA and LRAD  PT Short Term Goal 2 (Week 2): Pt will propell >100dft without rest break to improve cardiovascular endurance.  PT Short Term Goal 3 (Week 2): Pt will ascend 2 step with UE support and CGA assist  PT Short Term Goal 4 (Week 2): Pt will perform bed mobiltiy with supervision assist  Week 3:     Skilled Therapeutic Interventions/Progress Updates:   Pt received supine in bed and agreeable to PT. Supine>sit transfer without assist or cues. WC mobility x 1555fand supervision assist from PT for safety. Variable gait training with RW to weave through 6 cones x 6. Stepping over 4 cones x 6 with RW. Gait training back to room with RW and supervision assist from PT. Min cues for step width, AD management, posture and step height. Min-supervision assist from PT for safety and clothing management. All transfers completed with supervision assist throughout treatment. SpO2 monitored throughout at <96% with all mobility. Patient returned to room and left sitting EOB with call bell in reach and all needs met.         Therapy Documentation Precautions:   Precautions Precautions: Fall Precaution Comments:   Restrictions Weight Bearing Restrictions: No Vital Signs: Therapy Vitals Temp: 97.7 F (36.5 C) Pulse Rate: 81 Resp: 16 BP: 133/79 Patient Position (if appropriate): Lying Oxygen Therapy SpO2: 98 % O2 Device: Room Air Pain: Pain Assessment Pain Scale: 0-10 Pain Score: 0-No pain    Therapy/Group: Individual Therapy  AuLorie Phenix/31/2020, 2:21 PM

## 2018-05-17 NOTE — Progress Notes (Signed)
PHYSICAL MEDICINE & REHABILITATION PROGRESS NOTE   Subjective/Complaints: No new issues this morning.  Sleep remains an ongoing topic of discussion.  ROS: Patient denies fever, rash, sore throat, blurred vision, nausea, vomiting, diarrhea, cough, shortness of breath or chest pain, joint or back pain, headache, or mood change.   Objective:   No results found. Recent Labs    05/15/18 0853  WBC 7.6  HGB 12.6*  HCT 40.0  PLT 247   Recent Labs    05/15/18 0853  NA 136  K 4.0  CL 100  CO2 27  GLUCOSE 143*  BUN 16  CREATININE 0.75  CALCIUM 9.1    Intake/Output Summary (Last 24 hours) at 05/17/2018 1252 Last data filed at 05/17/2018 6378 Gross per 24 hour  Intake 562 ml  Output 1775 ml  Net -1213 ml     Physical Exam: Vital Signs Blood pressure 139/74, pulse 79, temperature 98.3 F (36.8 C), temperature source Oral, resp. rate 15, height 5\' 6"  (1.676 m), weight 76.8 kg, SpO2 93 %. Constitutional: No distress . Vital signs reviewed. HEENT: EOMI, oral membranes moist Neck: supple Cardiovascular: RRR without murmur. No JVD    Respiratory: CTA Bilaterally without wheezes or rales. Normal effort    GI: BS +, non-tender, non-distended  Musc: No edema in extremities.    Skin: Intact.  Warm and dry. Neurological:  Alert and oriented, except for date of month.   Motor: Grossly 4-/5 throughout, unchanged. Good trunk control Psych: Flat.  Assessment/Plan: 1. Functional deficits secondary to hypoxic encephalopathy and debility which require 3+ hours per day of interdisciplinary therapy in a comprehensive inpatient rehab setting.  Physiatrist is providing close team supervision and 24 hour management of active medical problems listed below.  Physiatrist and rehab team continue to assess barriers to discharge/monitor patient progress toward functional and medical goals  Care Tool:  Bathing    Body parts bathed by patient: Right arm, Left arm, Chest, Abdomen,  Right upper leg, Left upper leg, Right lower leg, Left lower leg, Face, Front perineal area, Buttocks   Body parts bathed by helper: Front perineal area, Buttocks     Bathing assist Assist Level: Supervision/Verbal cueing     Upper Body Dressing/Undressing Upper body dressing   What is the patient wearing?: Pull over shirt    Upper body assist Assist Level: Supervision/Verbal cueing    Lower Body Dressing/Undressing Lower body dressing      What is the patient wearing?: Pants     Lower body assist Assist for lower body dressing: Supervision/Verbal cueing     Toileting Toileting Toileting Activity did not occur (Clothing management and hygiene only): (incontinent care)  Toileting assist Assist for toileting: Supervision/Verbal cueing     Transfers Chair/bed transfer  Transfers assist  Chair/bed transfer activity did not occur: Safety/medical concerns  Chair/bed transfer assist level: Supervision/Verbal cueing     Locomotion Ambulation   Ambulation assist   Ambulation activity did not occur: Safety/medical concerns  Assist level: Supervision/Verbal cueing Assistive device: Walker-rolling Max distance: 150'   Walk 10 feet activity   Assist  Walk 10 feet activity did not occur: Safety/medical concerns  Assist level: Contact Guard/Touching assist Assistive device: Walker-rolling   Walk 50 feet activity   Assist Walk 50 feet with 2 turns activity did not occur: Safety/medical concerns  Assist level: Contact Guard/Touching assist Assistive device: Walker-rolling    Walk 150 feet activity   Assist Walk 150 feet activity did not occur: Safety/medical concerns  Assist level: Contact Guard/Touching assist Assistive device: Walker-rolling    Walk 10 feet on uneven surface  activity   Assist Walk 10 feet on uneven surfaces activity did not occur: Safety/medical concerns         Wheelchair     Assist Will patient use wheelchair at  discharge?: Yes Type of Wheelchair: Manual    Wheelchair assist level: Supervision/Verbal cueing Max wheelchair distance: 150 ft    Wheelchair 50 feet with 2 turns activity    Assist    Wheelchair 50 feet with 2 turns activity did not occur: Safety/medical concerns   Assist Level: Supervision/Verbal cueing   Wheelchair 150 feet activity     Assist Wheelchair 150 feet activity did not occur: Safety/medical concerns   Assist Level: Supervision/Verbal cueing     Medical Problem List and Plan: 1.  Encephalopathy/debility secondary to  Acute hypoxic respiratory failure/RUL pneumonia complicated by history of COPD oxygen dependent. Continue nebulizers as directed  Continue CIR 2.  DVT Prophylaxis/Anticoagulation: Lovenox. Monitor for any bleeding episodes 3. Pain Management:  Tylenol as needed 4. Mood/delirium:  Provide emotional support 5. Neuropsych: This patient is  not capable of making decisions on his own behalf. 6. Skin/Wound Care:   Added Diflucan 100 mg p.o. twice daily x3 (completed on 1/21) days for inguinal rash 7. Fluids/Electrolytes/Nutrition:  Routine in and out's   Labs reviewed 8. Dysphagia.  dysphagia #3 thins diet  DCed NG  IVF nightly DC'd on 1/28, monitor oral fluid intake.  Only took in 562 cc yesterday 9. ID/Strep Pneumo Bacteremia.  Completed ceftriaxone on 05/05/2018  10. CAD with stenting. Plavix. 11. Hypertension. Clonidine 0.1 mg daily, Toprol 50 mg daily  Relatively controlled on 1/31 12. Diabetes mellitus with peripheral neuropathy. Levemir BID. Checking blood sugars before meals and at bedtime. Patient on Glucophage 500 mg daily prior to admission             Reduced Levemir to 10 units twice daily as we convert to orals alone, DC'd on 1/20  Glucophage to 500 mg daily started on 1/22  Fair control at present. Consider incr of metformin to bid if needed 13. History of gout. Monitor for any flareups 14. Constipation. Laxative assistance.   15.  Acute blood loss anemia  Hemoglobin 12.6 on 1/29      Continue to monitor 16.  Hypoalbuminemia  Supplement initiated 17.  Sleep disturbance  Seroquel DC'd  Melatonin started on 1/20  Ambien started on 1/21, increased on 1/23, DC'd on 1/27  Restoril started on 1/27, increased on 1/28---continue to observe  -not sure we're going to find an "answer" to his chronic insomnia. Was on cocktail of meds PTA apparently.   -encouraged pt to work on being up and oob as much as possible during the day. 18.  Prolonged QTC  ECG reviewed  Monitor with medications  LOS: 14 days A FACE TO FACE EVALUATION WAS PERFORMED  Meredith Staggers 05/17/2018, 12:52 PM

## 2018-05-17 NOTE — Progress Notes (Signed)
Occupational Therapy Session Note  Patient Details  Name: Paul [name] MRN: [id number] Date of Birth: April 15, 1951  Today's Date: 05/17/2018 OT Individual Time: 0800-0901 OT Individual Time Calculation (min): 61 min    Short Term Goals: Week 3:  OT Short Term Goal 1 (Week 3): Pt will continue working toward updated LTGs at supervision to modified independent.  Skilled Therapeutic Interventions/Progress Updates:      Session 1 [phone]) Pt seen for OT treatment, declined shower today and wanted to wait until tomorrow since had had one later yesterday afternoon.  He transferred to the EOB and then donned his shoes with setup.  He then ambulated over to the sink with use of the RW for brushing his hair and teeth in standing.  He maintained standing for 3 mins during task.  Next, had pt ambulate down to the ADL apartment with supervision using the RW for support.  There he completed tub shower transfers with use of the tub bench and supervision.  Discussed modifications to bathroom with hand held shower and removing throw rugs for safety.  He voices understanding.  Next had pt transfer to the therapy gym where he worked on dynamic standing balance.  Pt stood while reaching different directions to pick up pegs for peg board designs with supervision.  Progressed to standing on foam surface as well to complete peg design with overall min assist for LOB.  Finished session with ambulation back to the room and pt resting in bedside recliner.  Oxygen sats checked throughout session on room air at 93% or greater.  Alarm belt in place with call button and phone in reach.    Session 2 [phone])  Pt completed toilet transfer from EOB to toilet with rail with supervision using the RW.  He was able to complete clothing management as well at supervision level.  Min assist for sit to stand from the lower toilet with use of grab bars and RW.  He then transferred out to the sink for washing his hands.  Second part of  session focused on standing balance.  Had pt stand at EOB and work on standing with feet normal width and then closer together with min guard.  Had him close his eyes as well with feet apart and together with min assist needed to balance.  Had him complete tandem standing as well with one foot in front of the other but not directly.  Min assist needed to balance.  Finished session with pt sitting EOB with call button and phone in reach and bed alarm in place.   Therapy Documentation Precautions:  Precautions Precautions: Fall Precaution Comments:   Restrictions Weight Bearing Restrictions: No   Pain: Pain Assessment Pain Scale: Faces Faces Pain Scale: Hurts a little bit Pain Type: Acute pain Pain Location: Leg Pain Orientation: Right;Left Pain Descriptors / Indicators: Burning Pain Onset: With Activity Pain Intervention(s): Ambulation/increased activity;Emotional support ADL: See Care Plan Section for some ADL details  Therapy/Group: Individual Therapy  Nyelah Emmerich OTR/L 05/17/2018, 9:20 AM

## 2018-05-17 NOTE — Progress Notes (Signed)
Speech Language Pathology Weekly Progress and Session Note  Patient Details  Name: Natthew Marlatt MRN: [id number] Date of Birth: [birth year]  Beginning of progress report period: May 10, 2018 End of progress report period: May 17, 2018  Today's Date: 05/17/2018 SLP Individual Time: 1001-1056 SLP Individual Time Calculation (min): 55 min  Short Term Goals: Week 2: SLP Short Term Goal 1 (Week 2): Pt will complete instrumental swallow exam to assess potential for liquid advancement. SLP Short Term Goal 1 - Progress (Week 2): Met SLP Short Term Goal 2 (Week 2): Pt will consume least restrictive diet with min assist for use of swallowing precautions and minimal overt s/s of aspiration.  SLP Short Term Goal 2 - Progress (Week 2): Met SLP Short Term Goal 3 (Week 2): Pt will sustain his attention to a basic, functional task for ~10 minutes with min cues for redirection.   SLP Short Term Goal 3 - Progress (Week 2): Met SLP Short Term Goal 4 (Week 2): Pt will complete basic, familiar tasks with min assist for functional problem solving.  SLP Short Term Goal 4 - Progress (Week 2): Progressing toward goal SLP Short Term Goal 5 (Week 2): Pt will return demonstration of at least 2 safety precautions with mod assist multimodal cues.   SLP Short Term Goal 5 - Progress (Week 2): Met SLP Short Term Goal 6 (Week 2): Pt will recall daily information with mod assist multimodal cues for use of external aids.   SLP Short Term Goal 6 - Progress (Week 2): Progressing toward goal    New Short Term Goals: Week 3: SLP Short Term Goal 1 (Week 3): Pt will consume least restrictive diet with Mod I assist for use of swallowing precautions and minimal overt s/s of aspiration.  SLP Short Term Goal 2 (Week 3): Pt will sustain his attention to a basic, functional task for ~20 minutes with min cues for redirection.   SLP Short Term Goal 3 (Week 3): Pt will complete basic, familiar tasks with min assist for functional  problem solving.  SLP Short Term Goal 4 (Week 3): Pt will recall daily information with min assist multimodal cues for use of external aids.    Weekly Progress Updates: Pt has made excellent gains this reporting period and has met 4 out of 6 short term goals.  Pt is currently supervision assist for safe swallowing and mod assist for cognition due to impaired attention and awareness.  Pt has demonstrated improved airway protection and mastication as well as sustained attention.  Pt is consuming dysphagia 3 diet and thin liquids with supervision cues for use of swallowing precautions.  Pt and family education is ongoing.  Pt would continue to benefit from skilled ST while inpatient in order to maximize functional independence and reduce burden of care prior to discharge.  Anticipate that pt will need min assist at discharge in addition to ST follow at next level of care.     Intensity: Minumum of 1-2 x/day, 30 to 90 minutes Frequency: 3 to 5 out of 7 days Duration/Length of Stay: 14-21 days  Treatment/Interventions: Cognitive remediation/compensation;Cueing hierarchy;Dysphagia/aspiration precaution training;Functional tasks;Patient/family education;Internal/external aids;Environmental controls   Daily Session  Skilled Therapeutic Interventions: Pt was seen for skilled ST session targeting cognitive goals. SLP facilitated session with a novel card game targeting use of memory compensatory strategies (word picture associations), during which pt required mod verbal cues to utilize strategies and recall information. Although somewhat distractible (evidenced by changing topic and looking at distractions in  the room), pt sustained his attention to tasks at hand for approximately 10 minute intervals with min verbal cues for redirection. During a visual logic calendar activity targeting error detection and attention to detail, pt identified illogical errors with min assist verbal cues or prompts from the  clinician. Pt's awareness of physical deficits was evidenced by his acknowledgment he will need assistance with balance and mobility upon his return home. He also expressed confidence in his ability to obtain help from family and therapists to complete cognitively demanding tasks upon discharge. Pt was left in bed with bed alarm set and call bell within reach. Recommend continue per current plan of care.   Pain Pain Assessment Pain Scale: 0-10 Pain Score: 0-No pain Faces Pain Scale: Hurts a little bit Pain Type: Acute pain Pain Location: Leg Pain Orientation: Right;Left Pain Descriptors / Indicators: Burning Pain Onset: With Activity Pain Intervention(s): Ambulation/increased activity;Emotional support  Therapy/Group: Individual Therapy  Jettie Booze, Student SLP  Jettie Booze 05/17/2018, 11:59 AM

## 2018-05-18 ENCOUNTER — Inpatient Hospital Stay (HOSPITAL_COMMUNITY): Payer: Medicare Other | Admitting: Occupational Therapy

## 2018-05-18 DIAGNOSIS — R5381 Other malaise: Secondary | ICD-10-CM

## 2018-05-18 LAB — GLUCOSE, CAPILLARY
Glucose-Capillary: 129 mg/dL — ABNORMAL HIGH (ref 70–99)
Glucose-Capillary: 114 mg/dL — ABNORMAL HIGH (ref 70–99)
Glucose-Capillary: 130 mg/dL — ABNORMAL HIGH (ref 70–99)

## 2018-05-18 NOTE — Progress Notes (Signed)
Occupational Therapy Session Note  Patient Details  Name: Paul [name] MRN: [id number] Date of Birth: [birth year]  Today's Date: 05/18/2018 OT Individual Time: 1105-1201 OT Individual Time Calculation (min): 56 min   Short Term Goals: Week 3:  OT Short Term Goal 1 (Week 3): Pt will continue working toward updated LTGs at supervision to modified independent.  Skilled Therapeutic Interventions/Progress Updates:    Pt greeted EOB, requesting to shower. Once IV site was covered, he ambulated with RW and supervision assist to TTB. He doffed clothing with the same assist at sit<stand level, then showered with setup. He donned LB garments with setup afterwards using device for standing balance. Pt ambulated with RW to EOB. He completed UB dressing and grooming tasks with setup. At end of session pt was left with lunch set up, bed alarm set, and all needs within reach.   02 sats while seated before shower: 96% on RA 02 sats EOB after showering: 96-98% on RA 02 sats before lunch: 97% on RA No SOB noted during session    Therapy Documentation Precautions:  Precautions Precautions: Fall Precaution Comments:   Restrictions Weight Bearing Restrictions: No Pain: In B LEs. RN made aware for his request for pain medication  Pain Assessment Pain Scale: 0-10 Pain Score: 6  Pain Location: Leg Pain Descriptors / Indicators: Aching Pain Frequency: Intermittent Pain Intervention(s): Medication (See eMAR) ADL: ADL Eating: Not assessed Grooming: Not assessed Upper Body Bathing: Supervision/safety Where Assessed-Upper Body Bathing: Sitting at sink Lower Body Bathing: Moderate assistance Where Assessed-Lower Body Bathing: Wheelchair, Sitting at sink, Standing at sink Upper Body Dressing: Moderate assistance Where Assessed-Upper Body Dressing: Wheelchair, Sitting at sink Lower Body Dressing: Maximal assistance Where Assessed-Lower Body Dressing: Standing at sink, Sitting at sink,  Wheelchair Toileting: Moderate assistance Where Assessed-Toileting: Glass blower/designer: Maximal Print production planner Method: Stand pivot Tub/Shower Transfer: Not assessed      Therapy/Group: Individual Therapy  Shalev Helminiak A Shaquetta Arcos 05/18/2018, 12:35 PM

## 2018-05-18 NOTE — Progress Notes (Signed)
[facility] PHYSICAL MEDICINE & REHABILITATION PROGRESS NOTE   Subjective/Complaints:  TRouble falling asleep at night.  Also get awakened by staff at noc, no breathing issues at noc  ROS: Patient denies CP, SOB, N/V/D Objective:   No results found. Recent Labs    05/15/18 0853  WBC 7.6  HGB 12.6*  HCT 40.0  PLT 247   Recent Labs    05/15/18 0853  NA 136  K 4.0  CL 100  CO2 27  GLUCOSE 143*  BUN 16  CREATININE 0.75  CALCIUM 9.1    Intake/Output Summary (Last 24 hours) at 05/18/2018 0644 Last data filed at 05/18/2018 0130 Gross per 24 hour  Intake 960 ml  Output 1400 ml  Net -440 ml     Physical Exam: Vital Signs Blood pressure (!) 164/86, pulse 85, temperature 98.1 F (36.7 C), resp. rate 18, height 5\' 6"  (1.676 m), weight 75.1 kg, SpO2 94 %. Constitutional: No distress . Vital signs reviewed. HEENT: EOMI, oral membranes moist Neck: supple Cardiovascular: RRR without murmur. No JVD    Respiratory: CTA Bilaterally without wheezes or rales. Normal effort    GI: BS +, non-tender, non-distended  Musc: No edema in extremities.    Skin: Intact.  Warm and dry. Neurological:  Alert and oriented, except for date of month.   Motor: Grossly 4-/5 throughout, unchanged. Good trunk control Psych: Flat.  Assessment/Plan: 1. Functional deficits secondary to hypoxic encephalopathy and debility which require 3+ hours per day of interdisciplinary therapy in a comprehensive inpatient rehab setting.  Physiatrist is providing close team supervision and 24 hour management of active medical problems listed below.  Physiatrist and rehab team continue to assess barriers to discharge/monitor patient progress toward functional and medical goals  Care Tool:  Bathing    Body parts bathed by patient: Right arm, Left arm, Chest, Abdomen, Right upper leg, Left upper leg, Right lower leg, Left lower leg, Face, Front perineal area, Buttocks   Body parts bathed by helper: Front perineal  area, Buttocks     Bathing assist Assist Level: Supervision/Verbal cueing     Upper Body Dressing/Undressing Upper body dressing   What is the patient wearing?: Pull over shirt    Upper body assist Assist Level: Supervision/Verbal cueing    Lower Body Dressing/Undressing Lower body dressing      What is the patient wearing?: Pants     Lower body assist Assist for lower body dressing: Supervision/Verbal cueing     Toileting Toileting Toileting Activity did not occur (Clothing management and hygiene only): (incontinent care)  Toileting assist Assist for toileting: Supervision/Verbal cueing     Transfers Chair/bed transfer  Transfers assist  Chair/bed transfer activity did not occur: Safety/medical concerns  Chair/bed transfer assist level: Supervision/Verbal cueing     Locomotion Ambulation   Ambulation assist   Ambulation activity did not occur: Safety/medical concerns  Assist level: Supervision/Verbal cueing Assistive device: Walker-rolling Max distance: 150'   Walk 10 feet activity   Assist  Walk 10 feet activity did not occur: Safety/medical concerns  Assist level: Contact Guard/Touching assist Assistive device: Walker-rolling   Walk 50 feet activity   Assist Walk 50 feet with 2 turns activity did not occur: Safety/medical concerns  Assist level: Contact Guard/Touching assist Assistive device: Walker-rolling    Walk 150 feet activity   Assist Walk 150 feet activity did not occur: Safety/medical concerns  Assist level: Contact Guard/Touching assist Assistive device: Walker-rolling    Walk 10 feet on uneven surface  activity  Assist Walk 10 feet on uneven surfaces activity did not occur: Safety/medical concerns         Wheelchair     Assist Will patient use wheelchair at discharge?: Yes Type of Wheelchair: Manual    Wheelchair assist level: Supervision/Verbal cueing Max wheelchair distance: 150 ft    Wheelchair 50  feet with 2 turns activity    Assist    Wheelchair 50 feet with 2 turns activity did not occur: Safety/medical concerns   Assist Level: Supervision/Verbal cueing   Wheelchair 150 feet activity     Assist Wheelchair 150 feet activity did not occur: Safety/medical concerns   Assist Level: Supervision/Verbal cueing     Medical Problem List and Plan: 1.  Encephalopathy/debility secondary to  Acute hypoxic respiratory failure/RUL pneumonia complicated by history of COPD oxygen dependent. Continue nebulizers as directed  Continue CIR 2.  DVT Prophylaxis/Anticoagulation: Lovenox. Monitor for any bleeding episodes 3. Pain Management:  Tylenol as needed, burning pain below knees , add gabapentin qhs 4. Mood/delirium:  Provide emotional support 5. Neuropsych: This patient is  not capable of making decisions on his own behalf. 6. Skin/Wound Care:   Added Diflucan 100 mg p.o. twice daily x3 (completed on 1/21) days for inguinal rash 7. Fluids/Electrolytes/Nutrition:  Routine in and out's   Labs reviewed 8. Dysphagia.  dysphagia #3 thins diet  DCed NG  IVF nightly DC'd on 1/28, monitor oral fluid intake.  Only took in 562 cc yesterday 9. ID/Strep Pneumo Bacteremia.  Completed ceftriaxone on 05/05/2018  10. CAD with stenting. Plavix. 11. Hypertension. Clonidine 0.1 mg daily, Toprol 50 mg daily  Relatively controlled on 1/31 12. Diabetes mellitus with peripheral neuropathy. Levemir BID. Checking blood sugars before meals and at bedtime. Patient on Glucophage 500 mg daily prior to admission             Reduced Levemir to 10 units twice daily as we convert to orals alone, DC'd on 1/20  Glucophage to 500 mg daily started on 1/22  Good control at present. Consider incr of metformin to bid if needed CBG (last 3)  Recent Labs    05/17/18 1137 05/17/18 1637 05/17/18 2137  GLUCAP 116* 158* 148*   13. History of gout. Monitor for any flareups 14. Constipation. Laxative assistance.  15.   Acute blood loss anemia  Hemoglobin 12.6 on 1/29      Continue to monitor 16.  Hypoalbuminemia  Supplement initiated 17.  Sleep disturbance  Seroquel DC'd  Melatonin started on 1/20  Ambien started on 1/21, increased on 1/23, DC'd on 1/27  Restoril started on 1/27, increased on 1/28---continue to observe  -starting gabapentin- should help with nocturnal neuropathic pain    18.  Prolonged QTC   Monitor with medications  LOS: 15 days A FACE TO FACE EVALUATION WAS PERFORMED  Charlett Blake 05/18/2018, 6:44 AM

## 2018-05-19 ENCOUNTER — Inpatient Hospital Stay (HOSPITAL_COMMUNITY): Payer: Medicare Other | Admitting: Occupational Therapy

## 2018-05-19 LAB — GLUCOSE, CAPILLARY
Glucose-Capillary: 141 mg/dL — ABNORMAL HIGH (ref 70–99)
Glucose-Capillary: 198 mg/dL — ABNORMAL HIGH (ref 70–99)
Glucose-Capillary: 121 mg/dL — ABNORMAL HIGH (ref 70–99)
Glucose-Capillary: 161 mg/dL — ABNORMAL HIGH (ref 70–99)

## 2018-05-19 NOTE — Progress Notes (Signed)
Occupational Therapy Session Note  Patient Details  Name: Paul [name] MRN: [id number] Date of Birth: 1950/08/16  Today's Date: 05/19/2018 OT Group Time: 1100-1200 OT Group Time Calculation (min): 60 min  Skilled Therapeutic Interventions/Progress Updates:    Pt engaged in therapeutic w/c level dance group focusing on patient choice, UE/LE strengthening, salience, activity tolerance, and social participation. Pt was guided through various dance-based exercises involving UEs/LEs and trunk. All music was selected by group members. Emphasis placed on activity tolerance and standing balance. Pt had high levels of participation during session. With cuing, able to safely assist with managing hemiparetic UE of his neighbor during hand-holding. He requested several songs, smiled often, and socially interacted with others. He stood during 1 song with steady assist, removing B UE support from back of chair to dance. At end of session pt self propelled back to room.   Therapy Documentation Precautions:  Precautions Precautions: Fall Precaution Comments:   Restrictions Weight Bearing Restrictions: No Vital Signs: Oxygen Therapy SpO2: 98 % O2 Device: Room Air Pain: No s/s pain during tx    ADL: ADL Eating: Not assessed Grooming: Not assessed Upper Body Bathing: Supervision/safety Where Assessed-Upper Body Bathing: Sitting at sink Lower Body Bathing: Moderate assistance Where Assessed-Lower Body Bathing: Wheelchair, Sitting at sink, Standing at sink Upper Body Dressing: Moderate assistance Where Assessed-Upper Body Dressing: Wheelchair, Sitting at sink Lower Body Dressing: Maximal assistance Where Assessed-Lower Body Dressing: Standing at sink, Sitting at sink, Wheelchair Toileting: Moderate assistance Where Assessed-Toileting: Glass blower/designer: Maximal Print production planner Method: Stand pivot Tub/Shower Transfer: Not assessed      Therapy/Group: Group Therapy  Paul [name] A  Paul [name] 05/19/2018, 12:44 PM

## 2018-05-19 NOTE — Plan of Care (Signed)
  Problem: RH BOWEL ELIMINATION Goal: RH STG MANAGE BOWEL W/MEDICATION W/ASSISTANCE Description STG Manage Bowel with Medication with Assistance. min  Outcome: Progressing   Problem: RH BLADDER ELIMINATION Goal: RH STG MANAGE BLADDER WITH ASSISTANCE Description STG Manage Bladder With Assistance. min  Outcome: Progressing   Problem: RH SKIN INTEGRITY Goal: RH STG SKIN FREE OF INFECTION/BREAKDOWN Description Free of further breakdown. Free of infection while on rehab with mod assist  Outcome: Progressing Goal: RH STG MAINTAIN SKIN INTEGRITY WITH ASSISTANCE Description STG Maintain Skin Integrity With Assistance. Mod  Outcome: Progressing   Problem: RH SAFETY Goal: RH STG ADHERE TO SAFETY PRECAUTIONS W/ASSISTANCE/DEVICE Description STG Adhere to Safety Precautions With Assistance/Device. mod  Outcome: Progressing

## 2018-05-19 NOTE — Progress Notes (Signed)
[facility] PHYSICAL MEDICINE & REHABILITATION PROGRESS NOTE   Subjective/Complaints:  No sleep issues last night reported.  Discussed with nursing no issues overnight.  ROS: Patient denies CP, SOB, N/V/D Objective:   No results found. No results for input(s): WBC, HGB, HCT, PLT in the last 72 hours. No results for input(s): NA, K, CL, CO2, GLUCOSE, BUN, CREATININE, CALCIUM in the last 72 hours.  Intake/Output Summary (Last 24 hours) at 05/19/2018 0852 Last data filed at 05/19/2018 0657 Gross per 24 hour  Intake 600 ml  Output 2875 ml  Net -2275 ml     Physical Exam: Vital Signs Blood pressure (!) 144/88, pulse 83, temperature 97.9 F (36.6 C), temperature source Oral, resp. rate 18, height 5\' 6"  (1.676 m), weight 75.1 kg, SpO2 96 %. Constitutional: No distress . Vital signs reviewed. HEENT: EOMI, oral membranes moist Neck: supple Cardiovascular: RRR without murmur. No JVD    Respiratory: CTA Bilaterally without wheezes or rales. Normal effort    GI: BS +, non-tender, non-distended  Musc: No edema in extremities.    Skin: Intact.  Warm and dry. Neurological:  Alert and oriented, except for date of month.   Motor: Grossly 4-/5 throughout, unchanged.  Psych: Flat.  Assessment/Plan: 1. Functional deficits secondary to hypoxic encephalopathy and debility which require 3+ hours per day of interdisciplinary therapy in a comprehensive inpatient rehab setting.  Physiatrist is providing close team supervision and 24 hour management of active medical problems listed below.  Physiatrist and rehab team continue to assess barriers to discharge/monitor patient progress toward functional and medical goals  Care Tool:  Bathing    Body parts bathed by patient: Right arm, Left arm, Chest, Abdomen, Right upper leg, Left upper leg, Right lower leg, Left lower leg, Face, Front perineal area, Buttocks   Body parts bathed by helper: Front perineal area, Buttocks     Bathing assist Assist  Level: Set up assist     Upper Body Dressing/Undressing Upper body dressing   What is the patient wearing?: Pull over shirt    Upper body assist Assist Level: Set up assist    Lower Body Dressing/Undressing Lower body dressing      What is the patient wearing?: Pants, Underwear/pull up     Lower body assist Assist for lower body dressing: Supervision/Verbal cueing     Toileting Toileting Toileting Activity did not occur (Clothing management and hygiene only): (incontinent care)  Toileting assist Assist for toileting: Supervision/Verbal cueing     Transfers Chair/bed transfer  Transfers assist  Chair/bed transfer activity did not occur: Safety/medical concerns  Chair/bed transfer assist level: Supervision/Verbal cueing     Locomotion Ambulation   Ambulation assist   Ambulation activity did not occur: Safety/medical concerns  Assist level: Supervision/Verbal cueing Assistive device: Walker-rolling Max distance: 150'   Walk 10 feet activity   Assist  Walk 10 feet activity did not occur: Safety/medical concerns  Assist level: Contact Guard/Touching assist Assistive device: Walker-rolling   Walk 50 feet activity   Assist Walk 50 feet with 2 turns activity did not occur: Safety/medical concerns  Assist level: Contact Guard/Touching assist Assistive device: Walker-rolling    Walk 150 feet activity   Assist Walk 150 feet activity did not occur: Safety/medical concerns  Assist level: Contact Guard/Touching assist Assistive device: Walker-rolling    Walk 10 feet on uneven surface  activity   Assist Walk 10 feet on uneven surfaces activity did not occur: Safety/medical concerns         Wheelchair  Assist Will patient use wheelchair at discharge?: Yes Type of Wheelchair: Manual    Wheelchair assist level: Supervision/Verbal cueing Max wheelchair distance: 150 ft    Wheelchair 50 feet with 2 turns activity    Assist     Wheelchair 50 feet with 2 turns activity did not occur: Safety/medical concerns   Assist Level: Supervision/Verbal cueing   Wheelchair 150 feet activity     Assist Wheelchair 150 feet activity did not occur: Safety/medical concerns   Assist Level: Supervision/Verbal cueing     Medical Problem List and Plan: 1.  Encephalopathy/debility secondary to  Acute hypoxic respiratory failure/RUL pneumonia complicated by history of COPD oxygen dependent. Continue nebulizers as directed  Continue CIR, PT OT speech 2.  DVT Prophylaxis/Anticoagulation: Lovenox. Monitor for any bleeding episodes 3. Pain Management:  Tylenol as needed, burning pain below knees , add gabapentin qhs, this may have helped his sleep as well 4. Mood/delirium:  Provide emotional support 5. Neuropsych: This patient is  not capable of making decisions on his own behalf. 6. Skin/Wound Care:   Added Diflucan 100 mg p.o. twice daily x3 (completed on 1/21) days for inguinal rash 7. Fluids/Electrolytes/Nutrition:  Routine in and out's   Labs reviewed 8. Dysphagia.  dysphagia #3 thins diet  DCed NG  IVF nightly DC'd on 1/28, monitor oral fluid intake.  Only took in 562 cc yesterday 9. ID/Strep Pneumo Bacteremia.  Completed ceftriaxone on 05/05/2018  10. CAD with stenting. Plavix. 11. Hypertension. Clonidine 0.1 mg daily, Toprol 50 mg daily Vitals:   05/18/18 2041 05/19/18 0648  BP:  (!) 144/88  Pulse:  83  Resp:  18  Temp:  97.9 F (36.6 C)  SpO2: 96% 96%   12. Diabetes mellitus with peripheral neuropathy. Levemir BID. Checking blood sugars before meals and at bedtime. Patient on Glucophage 500 mg daily prior to admission             Reduced Levemir to 10 units twice daily as we convert to orals alone, DC'd on 1/20  Glucophage to 500 mg daily started on 1/22  Good control at present 05/19/18. Consider incr of metformin to bid if needed CBG (last 3)  Recent Labs    05/18/18 1645 05/18/18 2136 05/19/18 0657  GLUCAP  114* 130* 141*   13. History of gout. Monitor for any flareups 14. Constipation. Laxative assistance.  15.  Acute blood loss anemia  Hemoglobin 12.6 on 1/29      Continue to monitor 16.  Hypoalbuminemia  Supplement initiated 17.  Sleep disturbance  Seroquel DC'd  Melatonin started on 1/20  Ambien started on 1/21, increased on 1/23, DC'd on 1/27  Restoril started on 1/27, increased on 1/28---continue to observe  -starting gabapentin- should help with nocturnal neuropathic pain    18.  Prolonged QTC   Monitor with medications  LOS: 16 days A FACE TO FACE EVALUATION WAS PERFORMED  Charlett Blake 05/19/2018, 8:52 AM

## 2018-05-19 NOTE — Progress Notes (Signed)
Occupational Therapy Session Note  Patient Details  Name: Paul [name] MRN: [id number] Date of Birth: 07/09/1950  Today's Date: 05/19/2018 OT Individual Time: 7425-9563 OT Individual Time Calculation (min): 58 min  Short Term Goals: Week 3:  OT Short Term Goal 1 (Week 3): Pt will continue working toward updated LTGs at supervision to modified independent.  Skilled Therapeutic Interventions/Progress Updates:    Pt greeted EOB eating breakfast. Min vcs for taking small bites/sips with 1 bout of coughing while he was conversing with OT. He then requested to shower. Ambulatory transfer to TTB completed with RW and supervision assist. He required vcs during tx to turn walker completely before sitting down to transfer surfaces. OT covered IV site and pt doffed clothing sit<stand using RW. He showered with setup and needed assist for drying off back when finished. Pt donned underwear and then ambulated to EOB in manner as written above. Pt proceeded to dress and complete grooming tasks with supervision/cuing-setup overall and increased time, sit<stand using device. He then reported need to use the bathroom, and began ambulating while pants were not fully pulled up over thighs. OT assisted for safety when pants continued lowering on his way to the toilet. Pt with continent bladder void, unable to have BM though he tried. Mod A sit<stand from low toilet and he ambulated back to bed. Pt left with all needs within reach and bed alarm set at session exit.   02 sats while eating breakfast: 100% on RA 02 sats post shower: 96-98% on RA 02 sats post toileting: 96-97% on RA Pt with increased fatigue compared to session yesterday and initiated more seated rest breaks   Therapy Documentation Precautions:  Precautions Precautions: Fall Precaution Comments:   Restrictions Weight Bearing Restrictions: No Vital Signs: Therapy Vitals Temp: 97.9 F (36.6 C) Temp Source: Oral Pulse Rate: 83 Resp: 18 BP: (!)  144/88 Patient Position (if appropriate): Lying Oxygen Therapy SpO2: 96 % O2 Device: Room Air Pain: in LEs. RN made aware of his request for Lidoderm patches at end of tx   ADL: ADL Eating: Not assessed Grooming: Not assessed Upper Body Bathing: Supervision/safety Where Assessed-Upper Body Bathing: Sitting at sink Lower Body Bathing: Moderate assistance Where Assessed-Lower Body Bathing: Wheelchair, Sitting at sink, Standing at sink Upper Body Dressing: Moderate assistance Where Assessed-Upper Body Dressing: Wheelchair, Sitting at sink Lower Body Dressing: Maximal assistance Where Assessed-Lower Body Dressing: Standing at sink, Sitting at sink, Wheelchair Toileting: Moderate assistance Where Assessed-Toileting: Glass blower/designer: Maximal Print production planner Method: Stand pivot Tub/Shower Transfer: Not assessed      Therapy/Group: Individual Therapy  Lexianna Weinrich A Ajeet Casasola 05/19/2018, 8:51 AM

## 2018-05-20 ENCOUNTER — Inpatient Hospital Stay (HOSPITAL_COMMUNITY): Payer: Medicare Other | Admitting: Occupational Therapy

## 2018-05-20 ENCOUNTER — Inpatient Hospital Stay (HOSPITAL_COMMUNITY): Payer: Medicare Other

## 2018-05-20 ENCOUNTER — Inpatient Hospital Stay (HOSPITAL_COMMUNITY): Payer: Medicare Other | Admitting: Physical Therapy

## 2018-05-20 LAB — GLUCOSE, CAPILLARY
Glucose-Capillary: 98 mg/dL (ref 70–99)
Glucose-Capillary: 156 mg/dL — ABNORMAL HIGH (ref 70–99)
Glucose-Capillary: 204 mg/dL — ABNORMAL HIGH (ref 70–99)
Glucose-Capillary: 112 mg/dL — ABNORMAL HIGH (ref 70–99)
Glucose-Capillary: 181 mg/dL — ABNORMAL HIGH (ref 70–99)

## 2018-05-20 LAB — BASIC METABOLIC PANEL
Anion gap: 10 (ref 5–15)
BUN: 20 mg/dL (ref 8–23)
CO2: 25 mmol/L (ref 22–32)
Calcium: 9.2 mg/dL (ref 8.9–10.3)
Chloride: 103 mmol/L (ref 98–111)
Creatinine, Ser: 1.04 mg/dL (ref 0.61–1.24)
GFR calc Af Amer: >60 mL/min (ref >60)
GFR calc non Af Amer: >60 mL/min (ref >60)
Glucose, Bld: 157 mg/dL — ABNORMAL HIGH (ref 70–99)
Potassium: 4.1 mmol/L (ref 3.5–5.1)
Sodium: 138 mmol/L (ref 135–145)

## 2018-05-20 MED ORDER — GUAIFENESIN 200 MG PO TABS
200.0000 mg | ORAL_TABLET | Freq: Four times a day (QID) | ORAL | Status: DC
  Administered 2018-05-20 – 2018-05-22 (×7): 200 mg via ORAL
  Filled 2018-05-20 (×9): qty 1

## 2018-05-20 NOTE — Progress Notes (Signed)
[facility] [facility] PHYSICAL MEDICINE & REHABILITATION PROGRESS NOTE   Subjective/Complaints: No new complaints. Still notes pain in left>right thighs, left sided pain radiates to foot. Denies back pain  ROS: Patient denies fever, rash, sore throat, blurred vision, nausea, vomiting, diarrhea, cough, shortness of breath or chest pain,   headache, or mood change.    Objective:   No results found. No results for input(s): WBC, HGB, HCT, PLT in the last 72 hours. Recent Labs    05/20/18 0805  NA 138  K 4.1  CL 103  CO2 25  GLUCOSE 157*  BUN 20  CREATININE 1.04  CALCIUM 9.2    Intake/Output Summary (Last 24 hours) at 05/20/2018 1045 Last data filed at 05/20/2018 0830 Gross per 24 hour  Intake 340 ml  Output 2200 ml  Net -1860 ml     Physical Exam: Vital Signs Blood pressure (!) 135/100, pulse 88, temperature 97.7 F (36.5 C), temperature source Oral, resp. rate 17, height 5\' 6"  (1.676 m), weight 79.9 kg, SpO2 94 %. Constitutional: No distress . Vital signs reviewed. HEENT: EOMI, oral membranes moist Neck: supple Cardiovascular: RRR without murmur. No JVD    Respiratory: CTA Bilaterally without wheezes or rales. Normal effort    GI: BS +, non-tender, non-distended  Musc: No edema in extremities.    Skin: Intact.  Warm and dry. Neurological:  Alert and oriented, except for date of month.   Motor: Grossly 4-/5 throughout, unchanged.  Psych: Flat.  Assessment/Plan: 1. Functional deficits secondary to hypoxic encephalopathy and debility which require 3+ hours per day of interdisciplinary therapy in a comprehensive inpatient rehab setting.  Physiatrist is providing close team supervision and 24 hour management of active medical problems listed below.  Physiatrist and rehab team continue to assess barriers to discharge/monitor patient progress toward functional and medical goals  Care Tool:  Bathing    Body parts bathed by patient: Right arm, Left arm, Chest, Abdomen, Right  upper leg, Left upper leg, Right lower leg, Left lower leg, Face, Front perineal area, Buttocks   Body parts bathed by helper: Front perineal area, Buttocks     Bathing assist Assist Level: Set up assist     Upper Body Dressing/Undressing Upper body dressing   What is the patient wearing?: Pull over shirt    Upper body assist Assist Level: Set up assist    Lower Body Dressing/Undressing Lower body dressing      What is the patient wearing?: Underwear/pull up, Pants     Lower body assist Assist for lower body dressing: Supervision/Verbal cueing     Toileting Toileting Toileting Activity did not occur (Clothing management and hygiene only): (incontinent care)  Toileting assist Assist for toileting: Supervision/Verbal cueing     Transfers Chair/bed transfer  Transfers assist  Chair/bed transfer activity did not occur: Safety/medical concerns  Chair/bed transfer assist level: Supervision/Verbal cueing     Locomotion Ambulation   Ambulation assist   Ambulation activity did not occur: Safety/medical concerns  Assist level: Supervision/Verbal cueing Assistive device: Walker-rolling Max distance: 12   Walk 10 feet activity   Assist  Walk 10 feet activity did not occur: Safety/medical concerns  Assist level: Contact Guard/Touching assist Assistive device: Walker-rolling   Walk 50 feet activity   Assist Walk 50 feet with 2 turns activity did not occur: Safety/medical concerns  Assist level: Contact Guard/Touching assist Assistive device: Walker-rolling    Walk 150 feet activity   Assist Walk 150 feet activity did not occur: Safety/medical concerns  Assist  level: Contact Guard/Touching assist Assistive device: Walker-rolling    Walk 10 feet on uneven surface  activity   Assist Walk 10 feet on uneven surfaces activity did not occur: Safety/medical concerns         Wheelchair     Assist Will patient use wheelchair at discharge?:  Yes Type of Wheelchair: Manual    Wheelchair assist level: Supervision/Verbal cueing Max wheelchair distance: 150 ft    Wheelchair 50 feet with 2 turns activity    Assist    Wheelchair 50 feet with 2 turns activity did not occur: Safety/medical concerns   Assist Level: Supervision/Verbal cueing   Wheelchair 150 feet activity     Assist Wheelchair 150 feet activity did not occur: Safety/medical concerns   Assist Level: Supervision/Verbal cueing     Medical Problem List and Plan: 1.  Encephalopathy/debility secondary to  Acute hypoxic respiratory failure/RUL pneumonia complicated by history of COPD oxygen dependent. Continue nebulizers as directed  Continue CIR, PT OT speech  2.  DVT Prophylaxis/Anticoagulation: Lovenox. Monitor for any bleeding episodes 3. Pain Management:  Tylenol as needed, burning pain below knees , added gabapentin qhs, this may have helped his sleep as well  -consider further work up as outpt if pain persists  -watched him ambulate and move today and he did so without any obvious discomfort 4. Mood/delirium:  Provide emotional support 5. Neuropsych: This patient is  not capable of making decisions on his own behalf. 6. Skin/Wound Care:   diflucan given for rash 7. Fluids/Electrolytes/Nutrition:  Routine in and out's   Labs reviewed 8. Dysphagia.  dysphagia #3 thins diet  DCed NG  IVF nightly DC'd on 1/28, monitor oral fluid intake.  BUN/Cr normal today 9. ID/Strep Pneumo Bacteremia.  Completed ceftriaxone on 05/05/2018  10. CAD with stenting. Plavix. 11. Hypertension. Clonidine 0.1 mg daily, Toprol 50 mg daily Vitals:   05/20/18 0923 05/20/18 0925  BP:    Pulse:  88  Resp: 17 17  Temp:    SpO2: 94% 94%   12. Diabetes mellitus with peripheral neuropathy. Levemir BID. Checking blood sugars before meals and at bedtime. Patient on Glucophage 500 mg daily prior to admission             Reduced Levemir to 10 units twice daily as we convert to  orals alone, DC'd on 1/20  Glucophage to 500 mg daily started on 1/22  reasonable control at present 05/20/18. Consider incr of metformin to bid if needed CBG (last 3)  Recent Labs    05/19/18 1629 05/19/18 2121 05/20/18 0638  GLUCAP 121* 161* 156*     13. History of gout. Monitor for any flareups 14. Constipation. Laxative assistance.  15.  Acute blood loss anemia  Hemoglobin 12.6 on 1/29      Continue to monitor 16.  Hypoalbuminemia  Supplement initiated 17.  Sleep disturbance  Seroquel DC'd  Melatonin started on 1/20  Ambien started on 1/21, increased on 1/23, DC'd on 1/27  Restoril started on 1/27, increased on 1/28---continue to observe  -continue gabapentin- should help with nocturnal neuropathic pain    18.  Prolonged QTC   Monitor with medications  LOS: 17 days A FACE TO [place] 05/20/2018, 10:45 AM

## 2018-05-20 NOTE — Progress Notes (Signed)
Physical Therapy Session Note  Patient Details  Name: Paul [name] MRN: [id number] Date of Birth: [birth year]  Today's Date: 05/20/2018 PT Individual Time: 0920-1000 PT Individual Time Calculation (min): 40 min   Short Term Goals: Week 2:  PT Short Term Goal 1 (Week 2): Pt will ambulate 61f with CGA and LRAD  PT Short Term Goal 2 (Week 2): Pt will propell >100dft without rest break to improve cardiovascular endurance.  PT Short Term Goal 3 (Week 2): Pt will ascend 2 step with UE support and CGA assist  PT Short Term Goal 4 (Week 2): Pt will perform bed mobiltiy with supervision assist   Skilled Therapeutic Interventions/Progress Updates: Pt presented at toilet completing BM and agreeable to therapy. Pt able to perform prei-care mod I and took several steps without AD to w/c. Pt requesting to work on ambulation as per pt did not have therapies over weekend. RT arrived to perform breathing treatment missed x568m session time. Pt then ambulated to rehab gym CGA and participated in obstacle course x 4 weaving through cones and stepping over threshold with RW. Per pt request trialed SBQC x [id number] required mod cues for sequencing with [facility]th pt noted to have increased dyspnea after ambulation (SpO2 98%). Pt and PTA in agreement that gait pattern more fluid with RW and better for energy conservation. Pt participated in re-bounder ball toss 2 x 15 for dynamic balance and global conditioning. After brief rest pt returned to room via RW and request to return to bed. Performed bed mobility mod I and left in bed with bed alarm on, call bell within reach and needs met.       Therapy Documentation Precautions:  Precautions Precautions: Fall Precaution Comments:   Restrictions Weight Bearing Restrictions: No General: PT Missed Treatment Reason: Other (Comment)(Breathing tx) Vital Signs: Therapy Vitals Pulse Rate: 88 Resp: 17 Oxygen Therapy SpO2: 94 % O2 Device: Room Air Pain: Pain  Assessment Pain Scale: Faces Faces Pain Scale: Hurts a little bit Pain Type: Acute pain Pain Location: Leg Pain Orientation: Right;Left Pain Descriptors / Indicators: Burning Pain Intervention(s): Medication (See eMAR);Repositioned  Therapy/Group: Individual Therapy  [name]  Aidin Doane, PTA  05/20/2018, 12:22 PM

## 2018-05-20 NOTE — Progress Notes (Signed)
Nutrition Follow-up  INTERVENTION:   - Continue Pro-stat 30 ml BID, each supplement provides 100 kcal and 15 grams of protein  - Continue MVI with minerals daily  - Continue Magic cup TID with meals, each supplement provides 290 kcal and 9 grams of protein  - Continue double protein portions with meals  NUTRITION DIAGNOSIS:   Increased nutrient needs related to other (therapies), chronic illness (COPD) as evidenced by estimated needs.  Ongoing  GOAL:   Patient will meet greater than or equal to 90% of their needs  Met  MONITOR:   PO intake, Supplement acceptance, Diet advancement, Skin, Labs, Weight trends  REASON FOR ASSESSMENT:   Consult Enteral/tube feeding initiation and management  ASSESSMENT:   68 year old male who presented to the ED on 04/21/18 with SOB and AMS. Pt admitted with acute respiratory failure related to RUL pneumonia requiring intubation on admission, AKI, and DKA. PMH significant for COPD on home oxygen, CAD, HTN, DM, tobacco use. Pt extubated on 04/27/18 and Cortrak placed for TF on 04/29/18. Diet advanced to dysphagia 1 with honey-thick liquids after MBS on 05/03/18.  Diet upgraded to dysphagia 3 with thin liquids on 1/30.  Overall, weight up 5 lbs since admission.  Pt in good spirits at time of visit. Pt endorses good appetite and good PO intake. Pt states that he does not like some of the food but "I eat most of it."  Pt states that he is taking the Pro-stat and MVI ("I take everything they give me"). Pt endorses consuming Magic Cups at meals.  Pt with no nutrition-related concerns at this time. Will continue with current plan of care.  Meal Completion: 75-100% x last 8 recorded meals  Medications reviewed and include: Pro-stat BID, SSI, Metformin, MVI with minerals, Protonix, Senna  Labs reviewed. CBG's: 204, 156, 161, 121 x 24 hours  UOP: 2475 ml x 24 hours  Diet Order:   Diet Order            DIET DYS 3 Room service appropriate? Yes;  Fluid consistency: Thin  Diet effective now              EDUCATION NEEDS:   Not appropriate for education at this time  Skin:  Skin Assessment:  Skin Integrity Issues: Stage I: R face Other: skin tear to scrotum  Last BM:  2/2 (medium type 3)  Height:   Ht Readings from Last 1 Encounters:  05/03/18 _0  (1.676 m)    Weight:   Wt Readings from Last 1 Encounters:  05/20/18 79.9 kg    Ideal Body Weight:  64.5 kg  BMI:  Body mass index is 28.43 kg/m.  Estimated Nutritional Needs:   Kcal:  2200-2400  Protein:  115-130 grams  Fluid:  >/= 2.0 L    Gaynell Face, MS, RD, LDN Inpatient Clinical Dietitian Pager: [phone] Weekend/After Hours: [phone]

## 2018-05-20 NOTE — Progress Notes (Signed)
Occupational Therapy Session Note  Patient Details  Name: Paul [name] MRN: [id number] Date of Birth: 12-06-1950  Today's Date: 05/20/2018 OT Individual Time: 5320-2334 OT Individual Time Calculation (min): 26 min    Short Term Goals: Week 3:  OT Short Term Goal 1 (Week 3): Pt will continue working toward updated LTGs at supervision to modified independent.  Skilled Therapeutic Interventions/Progress Updates:    Pt transferred to the EOB with supervision and then completed toilet transfer to the regular toilet with rail using the RW with supervision.  He completed two sit to stand transitions from the lower toilet at supervision.  Next, had pt ambulate down to the ADL apartment with use of the RW and supervision.  Once in the ADL apartment, discussed kitchen safety with use of the RW and recommendation for walker tray as pt's table to eat on is not in the kitchen.  He voiced understanding and will check with his daughter on getting one. He was able to transfer to the lower couch and complete sit to stand with supervision.   Pt also has a rollator at home, which we tried at end of session with supervision for 15 ft.  Told him he could use this in the kitchen as well but therapist felt the RW would probably be the safest optiion.  Returned to the room at end of session with pt sitting EOB with call button and phone in reach and bed alarm in place.    Therapy Documentation Precautions:  Precautions Precautions: Fall Precaution Comments:   Restrictions Weight Bearing Restrictions: No  Pain: Pain Assessment Pain Scale: Faces Pain Score: 0-No pain Faces Pain Scale: Hurts a little bit Pain Type: Acute pain Pain Location: Foot Pain Orientation: Right Pain Descriptors / Indicators: Burning Pain Onset: With Activity  Therapy/Group: Individual Therapy  Edenilson Austad OTR/L 05/20/2018, 3:49 PM

## 2018-05-20 NOTE — Progress Notes (Signed)
Occupational Therapy Session Note  Patient Details  Name: Jabin Tapp MRN: [id number] Date of Birth: 04/02/1951  Today's Date: 05/20/2018 OT Individual Time: [phone] OT Individual Time Calculation (min): 58 min    Short Term Goals: Week 3:  OT Short Term Goal 1 (Week 3): Pt will continue working toward updated LTGs at supervision to modified independent.  Skilled Therapeutic Interventions/Progress Updates:    Pt completed bathing, dressing, and grooming tasks during session.  Pt completed functional mobility with supervision to the shower for bathing. He completed all bathing sit to stand with supervision.  He then was able to sit on the shower seat and donn his underpants with setup.  He transferred out to the EOB for completion of dressing, all at supervision level.  Finished session with ambulation to the bathroom to gather up dirty clothing with use of the RW and supervision.  He then stood at the sink with setup to complete oral hygiene and brushing his hair.  Finished session with pt in the wheelchair with call button and phone in reach and call button in reach.   Therapy Documentation Precautions:  Precautions Precautions: Fall Precaution Comments:   Restrictions Weight Bearing Restrictions: No General:Pain: Pain Assessment Pain Scale: Faces Faces Pain Scale: Hurts a little bit Pain Type: Acute pain Pain Location: Leg Pain Orientation: Right;Left Pain Descriptors / Indicators: Burning Pain Intervention(s): Medication (See eMAR);Repositioned ADL: See Care Tool for some details of ADL  Therapy/Group: Individual Therapy  Marybeth Dandy OTR/L 05/20/2018, 8:59 AM

## 2018-05-20 NOTE — Progress Notes (Signed)
Speech Language Pathology Daily Session Note  Patient Details  Name: Paul [name] MRN: [id number] Date of Birth: 04-28-1950  Today's Date: 05/20/2018 SLP Individual Time: 1115-1200 SLP Individual Time Calculation (min): 45 min  Short Term Goals: Week 3: SLP Short Term Goal 1 (Week 3): Pt will consume least restrictive diet with Mod I assist for use of swallowing precautions and minimal overt s/s of aspiration.  SLP Short Term Goal 2 (Week 3): Pt will sustain his attention to a basic, functional task for ~20 minutes with min cues for redirection.   SLP Short Term Goal 3 (Week 3): Pt will complete basic, familiar tasks with min assist for functional problem solving.  SLP Short Term Goal 4 (Week 3): Pt will recall daily information with min assist multimodal cues for use of external aids.    Skilled Therapeutic Interventions:Skilled ST services focused on swallow and cognitive skills. SLP facilitated problem solving and recall skills utilizing novel card task, pt required initial min A verbal cues decreasing to supervision A verbal cues for problem solving as trials continued with min A verbal cues for recall of 3 ways to match. Pt required mod A verbal cues for semi-complex problem solving skills, when card task was played at a more complex level. Pt demonstrated sustained attention to task in 40 minute intervals with min A verbal cues for redirection. SLP facilitated recall and utilization of swallow strategies (small sips with straw), pt demonstrated mod I for recall and return demonstration. Pt was left in room with call bell within reach and bed alarm set. SLP reccomends to continue skilled services.     Pain Pain Assessment Pain Scale: Faces Pain Score: 0-No pain Faces Pain Scale: Hurts a little bit Pain Type: Acute pain Pain Location: Leg Pain Orientation: Right;Left Pain Descriptors / Indicators: Burning Pain Intervention(s): Medication (See eMAR);Repositioned  Therapy/Group:  Individual Therapy  Paul [name]  [facility] 05/20/2018, 12:38 PM

## 2018-05-20 NOTE — Progress Notes (Signed)
Occupational Therapy Session Note  Patient Details  Name: Paul [name] MRN: [id number] Date of Birth: 1950/12/15  Today's Date: 05/20/2018 OT Individual Time: 1415-1445 OT Individual Time Calculation (min): 30 min    Short Term Goals: Week 3:  OT Short Term Goal 1 (Week 3): Pt will continue working toward updated LTGs at supervision to modified independent.  Skilled Therapeutic Interventions/Progress Updates:    Patient seated at edge of bed upon arrival.  He presents with a bright affect and denies pain at this time.  Functional mobility with RW in room and to/from therapy areas with CS.  Patient able to talk during mobility activities - mild SOB but recovers quickly upon sitting.  Reviewed and practiced reaching in kitchen environment - requires min cues for safety.  Reviewed options for moving objects from place to place, dog care and home set up.  Patient able to open door in safe manner after initial instruction.  Patient returned to room, sitting on edge of bed with alarm set and items in reach.    Therapy Documentation Precautions:  Precautions Precautions: Fall Precaution Comments:   Restrictions Weight Bearing Restrictions: No General: General PT Missed Treatment Reason: Other (Comment)(Breathing tx) Vital Signs: Therapy Vitals Temp: 97.8 F (36.6 C) Temp Source: Oral Pulse Rate: 74 Resp: 16 BP: (!) 135/101 Patient Position (if appropriate): Sitting Oxygen Therapy SpO2: 98 % O2 Device: Room Air Pain: Pain Assessment Pain Scale: 0-10 Pain Score: 0-No pain   Other Treatments:     Therapy/Group: Individual Therapy  Carlos Levering 05/20/2018, 3:19 PM

## 2018-05-21 ENCOUNTER — Inpatient Hospital Stay (HOSPITAL_COMMUNITY): Payer: Medicare Other | Admitting: Occupational Therapy

## 2018-05-21 ENCOUNTER — Inpatient Hospital Stay (HOSPITAL_COMMUNITY): Payer: Medicare Other | Admitting: Speech Pathology

## 2018-05-21 ENCOUNTER — Inpatient Hospital Stay (HOSPITAL_COMMUNITY): Payer: Medicare Other | Admitting: Physical Therapy

## 2018-05-21 LAB — GLUCOSE, CAPILLARY
Glucose-Capillary: 129 mg/dL — ABNORMAL HIGH (ref 70–99)
Glucose-Capillary: 157 mg/dL — ABNORMAL HIGH (ref 70–99)
Glucose-Capillary: 165 mg/dL — ABNORMAL HIGH (ref 70–99)
Glucose-Capillary: 179 mg/dL — ABNORMAL HIGH (ref 70–99)

## 2018-05-21 NOTE — Discharge Summary (Signed)
Discharge summary job [id number]

## 2018-05-21 NOTE — Progress Notes (Signed)
Physical Therapy Session Note  Patient Details  Name: Paul [name] MRN: [id number] Date of Birth: [birth year]  Today's Date: 05/21/2018 PT Individual Time: 1000-1100 PT Individual Time Calculation (min): 60 min   Short Term Goals: Week 2:  PT Short Term Goal 1 (Week 2): Pt will ambulate 5f with CGA and LRAD  PT Short Term Goal 2 (Week 2): Pt will propell >100dft without rest break to improve cardiovascular endurance.  PT Short Term Goal 3 (Week 2): Pt will ascend 2 step with UE support and CGA assist  PT Short Term Goal 4 (Week 2): Pt will perform bed mobiltiy with supervision assist   Skilled Therapeutic Interventions/Progress Updates: Pt presented in bed agreeable to therapy. Performed bed mobility mod I and ambulated to ortho gym supervision with RW. Pt participated in car transfers and ambulated to ADL apt to practice furniture transfers and bed mobility. All transfers performed at supervision to mod I level. Pt then ambulated to rehab gym and ascended/descended x 12 steps with brief seated rest after 8 steps. Pt also participated in updated BWestern & Southern Financialwith improved score of 40/56 from 18/56 indicating a decreased fall risk but continues to require AD for safety. Pt returned to room at end of session and requested to use toilet prior to PTA departure. Pt performed ambulatory transfer to toilet and performed toilet transfers mod I. Pt returned to bed and left sitting at EOB with call bell within reach and needs met.      Therapy Documentation Precautions:  Precautions Precautions: Fall Precaution Comments:   Restrictions Weight Bearing Restrictions: No General:   Vital Signs: Therapy Vitals Pulse Rate: 92 Resp: 15 Oxygen Therapy SpO2: 96 % O2 Device: Room Air Pain: Pain Assessment Pain Scale: 0-10 Pain Score: 0-No pain Pain Type: Acute pain Pain Location: Foot Pain Orientation: Left Pain Descriptors / Indicators: Aching Mobility: Bed Mobility Bed Mobility: Rolling  Right;Rolling Left Rolling Right: Independent Rolling Left: Independent Transfers Transfers: Sit to Stand;Stand to Sit;Stand Pivot Transfers Sit to Stand: Independent with assistive device Stand to Sit: Independent with assistive device Stand Pivot Transfers: Independent with assistive device Transfer (Assistive device): Rolling walker Locomotion : Gait Ambulation: Yes Gait Assistance: Supervision/Verbal cueing Gait Distance (Feet): 150 Feet Assistive device: Rolling walker Gait Gait: Yes Gait Pattern: Decreased step length - right;Decreased step length - left;Decreased stride length;Right flexed knee in stance;Left flexed knee in stance Stairs / Additional Locomotion Stairs: Yes Number of Stairs: 12 Height of Stairs: 6 Ramp: Supervision/Verbal cueing Wheelchair Mobility Wheelchair Mobility: No  Trunk/Postural Assessment : Cervical Assessment Cervical Assessment: Exceptions to [facility]burgThoracic Assessment Thoracic Assessment: Exceptions to [facility] CenterLumbar Assessment Lumbar Assessment: Exceptions to W[facility] HospitalPostural Control Postural Control: Deficits on evaluation  Balance: Balance Balance Assessed: Yes Standardized Balance Assessment Standardized Balance Assessment: Berg Balance Test Berg Balance Test Sit to Stand: Able to stand  independently using hands Standing Unsupported: Able to stand safely 2 minutes Sitting with Back Unsupported but Feet Supported on Floor or Stool: Able to sit safely and securely 2 minutes Stand to Sit: Controls descent by using hands Transfers: Able to transfer safely, minor use of hands Standing Unsupported with Eyes Closed: Able to stand 10 seconds safely Standing Ubsupported with Feet Together: Able to place feet together independently and stand for 1 minute with supervision From Standing, Reach Forward with Outstretched Arm: Can reach forward >12 cm safely (5") From Standing Position, Pick up Object from Floor: Able to pick up shoe safely and  easily From Standing Position,  Turn to Look Behind Over each Shoulder: Looks behind one side only/other side shows less weight shift Turn 360 Degrees: Able to turn 360 degrees safely but slowly Standing Unsupported, Alternately Place Feet on Step/Stool: Able to complete >2 steps/needs minimal assist Standing Unsupported, One Foot in Front: Needs help to step but can hold 15 seconds Standing on One Leg: Tries to lift leg/unable to hold 3 seconds but remains standing independently Total Score: 40 Static Sitting Balance Static Sitting - Balance Support: Feet supported Static Sitting - Level of Assistance: 7: Independent Dynamic Sitting Balance Dynamic Sitting - Balance Support: During functional activity Dynamic Sitting - Level of Assistance: 6: Modified independent (Device/Increase time) Static Standing Balance Static Standing - Balance Support: During functional activity Static Standing - Level of Assistance: 6: Modified independent (Device/Increase time) Dynamic Standing Balance Dynamic Standing - Balance Support: During functional activity Dynamic Standing - Level of Assistance: 6: Modified independent (Device/Increase time) Dynamic Standing - Balance Activities: [employer];Reaching for objects Exercises:   Other Treatments:      Therapy/Group: Individual Therapy  Tessia Kassin  [name], PTA  05/21/2018, 1:02 PM

## 2018-05-21 NOTE — Progress Notes (Signed)
Occupational Therapy Discharge Summary  Patient Details  Name: Paul [name] MRN: [id number] Date of Birth: 06-30-1950  Today's Date: 05/21/2018 OT Individual Time: [phone] OT Individual Time Calculation (min): 41 min    Session Note:  Pt completed ADL session with bathing, dressing, and grooming tasks.  He was able to complete all transfers in and out of the walk-in shower as well as the elevated toilet with modified independence using the RW for support.  He completed all bathing with modified independence using the shower seat for support, as well as dressing sit to stand from the EOB.  Oral hygiene and brushing his hair was completed with modified independence using the RW in standing.  Therapist provided walker bag for pt to take home to assist with transport of clothing and shoes from the shower to the area he will get dressed at, as well as for moving dirty linens.  Pt left sitting on the EOB with bed alarm in place and call button and phone in reach.    Patient has met 10 of 10 long term goals due to improved activity tolerance, improved balance, postural control and ability to compensate for deficits.  Patient to discharge at overall Modified Independent level.  Patient's care partner is independent to provide the necessary physical assistance at discharge.    Reasons goals not met: NA  Recommendation:  Patient will benefit from ongoing skilled OT services in home health setting to continue to advance functional skills in the area of BADL and Reduce care partner burden.  Pt is modified independent for most selfcare tasks but still demonstrates decreased overall endurance and decreased dynamic standing balance.  Feel he will benefit from brief HHOT to further increase his independence back to an independent level.   Equipment: No equipment provided  Reasons for discharge: treatment goals met and discharge from hospital  Patient/family agrees with progress made and goals achieved:  Yes  OT Discharge Precautions/Restrictions  Precautions Precautions: Fall Restrictions Weight Bearing Restrictions: No  Pain Pain Assessment Pain Scale: 0-10 Pain Score: 1  Faces Pain Scale: Hurts a little bit Pain Type: Acute pain Pain Location: Foot Pain Orientation: Left Pain Descriptors / Indicators: Aching Pain Onset: With Activity Pain Intervention(s): Repositioned;Emotional support Multiple Pain Sites: No ADL ADL Eating: Independent Where Assessed-Eating: Edge of bed Grooming: Modified independent Where Assessed-Grooming: Standing at sink Upper Body Bathing: Modified independent Where Assessed-Upper Body Bathing: Shower Lower Body Bathing: Modified independent Where Assessed-Lower Body Bathing: Shower Upper Body Dressing: Independent Where Assessed-Upper Body Dressing: Edge of bed Lower Body Dressing: Modified independent Where Assessed-Lower Body Dressing: Edge of bed Toileting: Modified independent Where Assessed-Toileting: Glass blower/designer: Diplomatic Services operational officer Method: Counselling psychologist: Raised toilet seat, Grab bars Tub/Shower Transfer: Close supervison Clinical cytogeneticist Method: Optometrist: Civil engineer, contracting without back Social research officer, government: Modified independent Social research officer, government Method: Heritage manager: Shower seat without back Vision Baseline Vision/History: Wears glasses Wears Glasses: At all times Patient Visual Report: No change from baseline Vision Assessment?: No apparent visual deficits Perception  Perception: Within Functional Limits Praxis Praxis: Intact Cognition Overall Cognitive Status: Within Functional Limits for tasks assessed Arousal/Alertness: Awake/alert Orientation Level: Oriented X4 Attention: Sustained;Selective Sustained Attention: Appears intact Selective Attention: Appears intact Memory: Appears intact Awareness: Appears  intact Safety/Judgment: Appears intact Sensation Sensation Light Touch: Appears Intact Hot/Cold: Appears Intact Proprioception: Appears Intact Stereognosis: Appears Intact Coordination Gross Motor Movements are Fluid and Coordinated: Yes Fine Motor Movements are Fluid and Coordinated: Yes Motor  Motor Motor: Within Functional Limits Motor - Discharge Observations: generalized deconditioning Mobility  Bed Mobility Bed Mobility: Rolling Right;Rolling Left Rolling Right: Independent Rolling Left: Independent Transfers Sit to Stand: Independent with assistive device Stand to Sit: Independent with assistive device  Trunk/Postural Assessment  Cervical Assessment Cervical Assessment: Exceptions to [facility] Thoracic Assessment Thoracic Assessment: Exceptions to [facility] Lumbar Assessment Lumbar Assessment: Exceptions to [facility] Postural Control Postural Control: Deficits on evaluation  Balance Balance Balance Assessed: Yes Standardized Balance Assessment Standardized Balance Assessment: Berg Balance Test Berg Balance Test Sit to Stand: Able to stand  independently using hands Standing Unsupported: Able to stand safely 2 minutes Sitting with Back Unsupported but Feet Supported on Floor or Stool: Able to sit safely and securely 2 minutes Stand to Sit: Controls descent by using hands Transfers: Able to transfer safely, minor use of hands Standing Unsupported with Eyes Closed: Able to stand 10 seconds safely Standing Ubsupported with Feet Together: Able to place feet together independently and stand for 1 minute with supervision From Standing, Reach Forward with Outstretched Arm: Can reach forward >12 cm safely (5") From Standing Position, Pick up Object from Floor: Able to pick up shoe safely and easily From Standing Position, Turn to Look Behind Over each Shoulder: Looks behind one side only/other side shows less weight shift Turn 360 Degrees: Able to turn 360 degrees safely but  slowly Standing Unsupported, Alternately Place Feet on Step/Stool: Able to complete >2 steps/needs minimal assist Standing Unsupported, One Foot in Front: Needs help to step but can hold 15 seconds Standing on One Leg: Tries to lift leg/unable to hold 3 seconds but remains standing independently Total Score: 40 Static Sitting Balance Static Sitting - Balance Support: Feet supported Static Sitting - Level of Assistance: 7: Independent Dynamic Sitting Balance Dynamic Sitting - Balance Support: During functional activity Dynamic Sitting - Level of Assistance: 6: Modified independent (Device/Increase time) Static Standing Balance Static Standing - Balance Support: During functional activity Static Standing - Level of Assistance: 6: Modified independent (Device/Increase time) Dynamic Standing Balance Dynamic Standing - Balance Support: During functional activity Dynamic Standing - Level of Assistance: 6: Modified independent (Device/Increase time) Dynamic Standing - Balance Activities: [employer];Reaching for objects Extremity/Trunk Assessment RUE Assessment RUE Assessment: Within Functional Limits Active Range of Motion (AROM) Comments: shoulder flexion 0-150 degrees bilaterally, all other movements WFLs General Strength Comments: strength 4/5 throughout LUE Assessment LUE Assessment: Within Functional Limits Active Range of Motion (AROM) Comments: shoulder flexion 0-150 degrees bilaterally, all other movements WFLs General Strength Comments: strength 4/5 throughout   Kimani Hovis OTR/L 05/21/2018, 11:33 AM

## 2018-05-21 NOTE — Discharge Summary (Addendum)
NAMERICAHRD, SCHWAGER MEDICAL RECORD ZE:[id number] ACCOUNT [id number] DATE OF BIRTH:Nov 15, 1950 FACILITY: [facility] LOCATION: [facility] PHYSICIAN:Ameshia Pewitt, MD  DISCHARGE SUMMARY  DATE OF DISCHARGE:  05/22/2018  DISCHARGE DIAGNOSES: 1.  Encephalopathy  debilitation secondary to acute hypoxic respiratory failure with upper lobe pneumonia complicated by history of chronic obstructive pulmonary disease, oxygen dependent.   2.  Subcutaneous Lovenox for deep venous thrombosis prophylaxis. 3.  Pain management. 4.  Dysphagia  5.  Streptococcus pneumoniae bacteremia  6.  Coronary artery disease  with stenting.  7.  Hypertension,  8.  Diabetes mellitus. 9.  Constipation.  HOSPITAL COURSE:  This is a 68 year old right-handed male with history of CAD, angioplasty.  Maintained on Plavix, COPD, tobacco abuse and supplemental oxygen, who lives alone, independent prior to admission.  He has a daughter in the area.  Presented  04/21/2018 with increasing shortness of breath, altered mental status.  Chest x-ray indicated a right upper lobe pneumonia.  He did require intubation.  Course complicated by blood cultures Pneumoniae bacteremia.  Extubated 04/27/2018.  Completing a  course of ceftriaxone 05/05/2018.  Noted bouts of agitation, relevant restlessness.  He did require Precedex.  Subcutaneous Lovenox for DVT prophylaxis.  Swallow study 05/03/2018, dysphagia #1,  honey thick liquid and slowly advanced.  The patient was  admitted for comprehensive rehabilitation program.  PAST MEDICAL HISTORY:  See discharge diagnoses.  SOCIAL HISTORY:  Lives alone, independent prior to admission.  FUNCTIONAL STATUS:  Upon admission to rehab services was +2 physical assist sit to stand, moderate assist supine to sit, mod/max assist with ADLs.  PHYSICAL EXAMINATION: VITAL SIGNS:  Blood pressure 150/82, pulse 96, temperature 98, respirations 13. GENERAL:  Alert male, in no acute distress.  EOMs intact.  Speech was a bit  dysarthric, follows full commands. CARDIOVASCULAR:  Rate controlled. ABDOMEN:  Soft, nontender, good bowel sounds. LUNGS:  Decreased breath sounds, clear to auscultation.  REHABILITATION HOSPITAL COURSE:  The patient was admitted to inpatient rehabilitation services.  Therapies initiated on a 3-hour daily basis.  The following issues were addressed:    Pertaining to the OR.  Acute hypoxic respiratory failure, right upper lobe pneumonia with noted history of COPD, oxygen dependent prior to admission, he completed a course of IV antibiotics, remaining afebrile.  Working with energy conservation  techniques.  Subcutaneous Lovenox for DVT prophylaxis.    Blood pressure is overall controlled with Lopressor.  His diet had been advanced to a mechanical soft.  No chest pain or shortness of breath.    He continued on Plavix for history of CAD with stenting.    Blood sugars overall controlled.  He remained on Glucophage only with full diabetic teaching.    Bouts of constipation, resolved with laxative assistance.    The patient received weekly collaborative interdisciplinary team conferences to discuss estimated length of stay, family teaching, any barriers to discharge.  He was able to perform, pericare, modified independent.  Ambulate several steps without  assistive device.  Used a broad-based cane for extended distances and sometime a rolling walker, again working with energy conservation techniques.  ADLs  completed functional mobility with supervision to the shower for bathing.  Completed all bathing  sit to stand with supervision.  Overall, patient with excellent gains and plan was discharged to home.  DISCHARGE MEDICATIONS:  Included Plavix 75 mg p.o. daily, colchicine 0.6 mg p.o. daily, Flonase daily, Lidoderm patch daily, Claritin 10 mg p.o. daily, melatonin 3 mg p.o. at bedtime, Glucophage 250 mg p.o. daily, Lopressor 25 mg  p.o. b.i.d., continue nebulizer treatments as prior to  admission, multivitamin daily, Protonix 40 mg p.o. daily, Restoril 15 mg p.o. at bedtime.  DIET:  Mechanical soft.  FOLLOWUP:  The patient would follow up with Dr. Delice Lesch at the outpatient rehab service office as directed; Dr. [name], pulmonary services, call for appointment as needed; Dr. Bevelyn Buckles, medical management.  SPECIAL INSTRUCTIONS:  No smoking, drinking or alcohol.  Continue supplemental oxygen as prior to admission.  AN/NUANCE D:05/21/2018 T:05/21/2018 JOB:[id number] Patient seen and examined by me on day of discharge. Delice Lesch, MD, ABPMR

## 2018-05-21 NOTE — Progress Notes (Signed)
Physical Therapy Discharge Summary  Patient Details  Name: Paul [name] MRN: [id number] Date of Birth: [birth year]  Patient has met 11 of 11 long term goals due to improved activity tolerance, improved balance, increased strength, improved attention, improved awareness and improved coordination.  Patient to discharge at an ambulatory level Supervision.   Patient's care partner is independent to provide the necessary physical assistance at discharge.  Reasons goals not met: N/A all goals met  Recommendation:  Patient will benefit from ongoing skilled PT services in home health setting to continue to advance safe functional mobility, address ongoing impairments in endurance, LE strength, and balance, and minimize fall risk.  Equipment: RW  Reasons for discharge: treatment goals met  Patient/family agrees with progress made and goals achieved: Yes  PT Discharge Precautions/Restrictions Precautions Precautions: Fall Restrictions Weight Bearing Restrictions: No Vital Signs Therapy Vitals Pulse Rate: 92 Resp: 15 Oxygen Therapy SpO2: 96 % O2 Device: Room Air Pain Pain Assessment Pain Scale: 0-10 Pain Score: 0-No pain Pain Type: Acute pain Pain Location: Foot Pain Orientation: Left Pain Descriptors / Indicators: Aching Vision/Perception  Perception Perception: Within Functional Limits Praxis Praxis: Intact  Cognition Overall Cognitive Status: Within Functional Limits for tasks assessed Arousal/Alertness: Awake/alert Orientation Level: Oriented X4 Attention: Sustained;Selective Sustained Attention: Appears intact Selective Attention: Appears intact Memory: Appears intact Awareness: Appears intact Safety/Judgment: Appears intact Sensation Sensation Light Touch: Appears Intact Hot/Cold: Appears Intact Proprioception: Appears Intact Stereognosis: Appears Intact Coordination Gross Motor Movements are Fluid and Coordinated: Yes Fine Motor Movements are Fluid and  Coordinated: Yes Motor  Motor Motor: Within Functional Limits Motor - Discharge Observations: generalized deconditioning  Mobility Bed Mobility Bed Mobility: Rolling Right;Rolling Left Rolling Right: Independent Rolling Left: Independent Transfers Transfers: Sit to Stand;Stand to Sit;Stand Pivot Transfers Sit to Stand: Independent with assistive device Stand to Sit: Independent with assistive device Stand Pivot Transfers: Independent with assistive device Transfer (Assistive device): Rolling walker Locomotion  Gait Ambulation: Yes Gait Assistance: Supervision/Verbal cueing Gait Distance (Feet): 150 Feet Assistive device: Rolling walker Gait Gait: Yes Gait Pattern: Decreased step length - right;Decreased step length - left;Decreased stride length;Right flexed knee in stance;Left flexed knee in stance Stairs / Additional Locomotion Stairs: Yes Number of Stairs: 12 Height of Stairs: 6 Ramp: Supervision/Verbal cueing Wheelchair Mobility Wheelchair Mobility: No  Trunk/Postural Assessment  Cervical Assessment Cervical Assessment: Exceptions to [facility] Thoracic Assessment Thoracic Assessment: Exceptions to [facility] Lumbar Assessment Lumbar Assessment: Exceptions to [facility] Postural Control Postural Control: Deficits on evaluation  Balance Balance Balance Assessed: Yes Standardized Balance Assessment Standardized Balance Assessment: Berg Balance Test Berg Balance Test Sit to Stand: Able to stand  independently using hands Standing Unsupported: Able to stand safely 2 minutes Sitting with Back Unsupported but Feet Supported on Floor or Stool: Able to sit safely and securely 2 minutes Stand to Sit: Controls descent by using hands Transfers: Able to transfer safely, minor use of hands Standing Unsupported with Eyes Closed: Able to stand 10 seconds safely Standing Ubsupported with Feet Together: Able to place feet together independently and stand for 1 minute with supervision From  Standing, Reach Forward with Outstretched Arm: Can reach forward >12 cm safely (5") From Standing Position, Pick up Object from Floor: Able to pick up shoe safely and easily From Standing Position, Turn to Look Behind Over each Shoulder: Looks behind one side only/other side shows less weight shift Turn 360 Degrees: Able to turn 360 degrees safely but slowly Standing Unsupported, Alternately Place Feet on Step/Stool: Able to complete >2 steps/needs minimal  assist Standing Unsupported, One Foot in Front: Needs help to step but can hold 15 seconds Standing on One Leg: Tries to lift leg/unable to hold 3 seconds but remains standing independently Total Score: 40 Static Sitting Balance Static Sitting - Balance Support: Feet supported Static Sitting - Level of Assistance: 7: Independent Dynamic Sitting Balance Dynamic Sitting - Balance Support: During functional activity Dynamic Sitting - Level of Assistance: 6: Modified independent (Device/Increase time) Static Standing Balance Static Standing - Balance Support: During functional activity Static Standing - Level of Assistance: 6: Modified independent (Device/Increase time) Dynamic Standing Balance Dynamic Standing - Balance Support: During functional activity Dynamic Standing - Level of Assistance: 6: Modified independent (Device/Increase time) Dynamic Standing - Balance Activities: [employer];Reaching for objects Extremity Assessment  RUE Assessment RUE Assessment: Within Functional Limits Active Range of Motion (AROM) Comments: shoulder flexion 0-150 degrees bilaterally, all other movements WFLs General Strength Comments: strength 4/5 throughout LUE Assessment LUE Assessment: Within Functional Limits Active Range of Motion (AROM) Comments: shoulder flexion 0-150 degrees bilaterally, all other movements WFLs General Strength Comments: strength 4/5 throughout RLE Assessment RLE Assessment: Exceptions to [facility] General Strength Comments:  grossly 4/5 proximal to distal LLE Assessment LLE Assessment: Exceptions to [facility] General Strength Comments: grossly 4/5 proximal to distal    Rosita DeChalus 05/21/2018, 12:59 PM   Burnard Bunting, PT, DPT 05/21/2018, 4:55 PM

## 2018-05-21 NOTE — Progress Notes (Signed)
Occupational Therapy Session Note  Patient Details  Name: Kortez Hendricks MRN: [id number] Date of Birth: 11/17/1950  Today's Date: 05/21/2018 OT Individual Time: 1130-1200 OT Individual Time Calculation (min): 30 min    Short Term Goals: Week 1:  OT Short Term Goal 1 (Week 1): Pt will complete sit<stand from toilet with Mod A  OT Short Term Goal 1 - Progress (Week 1): Met OT Short Term Goal 2 (Week 1): Pt will complete UB dressing with Min A  OT Short Term Goal 2 - Progress (Week 1): Met OT Short Term Goal 3 (Week 1): Pt will complete 1 grooming task in standing while maintaining stable vitals to increase standing endurance  OT Short Term Goal 3 - Progress (Week 1): Not met Week 2:  OT Short Term Goal 1 (Week 2): Pt will complete LB bathing sit to stand with close supervision. OT Short Term Goal 1 - Progress (Week 2): Met OT Short Term Goal 2 (Week 2): Pt will complete LB dressing with supervision sit to stand.   OT Short Term Goal 2 - Progress (Week 2): Met OT Short Term Goal 3 (Week 2): Pt will maintain standing at the sink for 2 mins or greater to complete grooming tasks.   OT Short Term Goal 3 - Progress (Week 2): Met OT Short Term Goal 4 (Week 2): Pt will perform toilet transfer to the 3:1 with RW and close supervision. OT Short Term Goal 4 - Progress (Week 2): Met  Skilled Therapeutic Interventions/Progress Updates:    Pt seen this session for endurance and balance exercises. Pt ambulated with RW to the gym and worked on standing balance exercises of alternating steps to step on different targets and sit to stands without UE support holding onto a large ball.  With reaching ball overhead pt moves into back hyper extension so cued pt to engage his abdominal muscles and he was able to perform activity better. Pt tolerated exercises well and only needed a few short rest breaks. Pt ambulated back to room then used hand sanitizer to prepare for lunch. Pt in room with all needs met.  Therapy  Documentation Precautions:  Precautions Precautions: Fall Precaution Comments:   Restrictions Weight Bearing Restrictions: No    Vital Signs: Therapy Vitals Pulse Rate: 92 Resp: 15 BP: 126/76 Oxygen Therapy SpO2: 96 % O2 Device: Room Air Pain: Pain Assessment Pain Scale: 0-10 Pain Score: 0-No pain Faces Pain Scale: Hurts a little bit Pain Type: Acute pain Pain Location: Foot Pain Orientation: Left Pain Descriptors / Indicators: Aching Pain Onset: With Activity Pain Intervention(s): Repositioned;Emotional support Multiple Pain Sites: No   Therapy/Group: Individual Therapy  , 05/21/2018, 12:09 PM  

## 2018-05-21 NOTE — Progress Notes (Signed)
Speech Language Pathology Discharge Summary  Patient Details  Name: Paul [name] MRN: [id number] Date of Birth: September 19, 1950  Today's Date: 05/21/2018 SLP Individual Time: 0105-0157 SLP Individual Time Calculation (min): 52 min   Skilled Therapeutic Interventions:  Pt was seen for skilled ST session focused on dysphagia and cognition. Pt recalled and demonstrated use of safe swallow precautions with Mod I assist during consumption of thin liquids. Pt exhibited one immediate cough throughout several sips of thin. SLP facilitated session with a medication management activity targeting memory and complex functional problem solving. Once current medications were reviewed with pt and oriented to task, pt interpreted medication dosages and organized pill box with 100% accuracy given supervision verbal cues for error detection. Although he completed the task appropriately today, he required min assist to raise awareness and rationalize the importance of correct administration of medications as well as to anticipate the health implications of over or under medicating. Family education has been provided throughout pt's stay, however pt's daughter has not been present for last week of ST sessions. Therefore, SLP would like to emphasize the importance of intermittent supervision to review pill box/identify any errors to ensure greatest safety with medication management and other complex cognitive tasks. Pt was left sitting on edge of bed with call bell within reach and all questions answers to his satisfaction. Recommend continue per current plan of care.    Patient has met 6 of 7 long term goals.  Patient to discharge at overall Supervision level.  Reasons goals not met: still requires min assist to identify and/or correct errors   Clinical Impression/Discharge Summary:   Pt has made significant functional gains while inpatient and is discharging having met 6 out of 7 long term goals.  Pt is currently supervision  for most tasks due to cognitive and awareness deficits. Pt is consuming dysphagia 3 diet, thin liquids with Mod I assist for use of swallowing precautions.  Pt/family education is complete at this time.  Pt has demonstrated improved swallowing function (evidenced by instrumental MBSS), sustained attention, as well as functional problem solving. Pt is discharging home with recommendations for ST follow up at next level of care to maximize functional independence and safety.   Care Partner:  Caregiver Able to Provide Assistance: Yes  Type of Caregiver Assistance: Cognitive  Recommendation:  24 hour supervision/assistance;Home Health SLP;Outpatient SLP  Rationale for SLP Follow Up: Maximize cognitive function and independence;Maximize swallowing safety   Equipment: none recommended   Reasons for discharge: Discharged from hospital   Patient/Family Agrees with Progress Made and Goals Achieved: Yes   Jettie Booze, Student SLP   Jettie Booze 05/21/2018, 2:53 PM

## 2018-05-21 NOTE — Progress Notes (Signed)
[place] PHYSICAL MEDICINE & REHABILITATION PROGRESS NOTE   Subjective/Complaints: Patient seen laying in bed this morning.  He slept fairly well per sleep chart, however patient states he did not sleep at all.  He is ready for discharge tomorrow.  ROS: Denies CP, SOB, N/V/D  Objective:   No results found. No results for input(s): WBC, HGB, HCT, PLT in the last 72 hours. Recent Labs    05/20/18 0805  NA 138  K 4.1  CL 103  CO2 25  GLUCOSE 157*  BUN 20  CREATININE 1.04  CALCIUM 9.2    Intake/Output Summary (Last 24 hours) at 05/21/2018 0801 Last data filed at 05/21/2018 0207 Gross per 24 hour  Intake 820 ml  Output 1500 ml  Net -680 ml     Physical Exam: Vital Signs Blood pressure 123/78, pulse 78, temperature 97.9 F (36.6 C), temperature source Oral, resp. rate 15, height 5\' 6"  (1.676 m), weight 78.1 kg, SpO2 97 %. Constitutional: No distress . Vital signs reviewed. HENT: Normocephalic.  Atraumatic. Eyes: EOMI. No discharge. Cardiovascular: RRR. No JVD. Respiratory: CTA Bilaterally. Normal effort. GI: BS +. Non-distended. Musc: No edema or tenderness in extremities. Neurological:  Alert Motor: Grossly 4/5 proximal to distal, improving Psych: Flat.  Assessment/Plan: 1. Functional deficits secondary to hypoxic encephalopathy and debility which require 3+ hours per day of interdisciplinary therapy in a comprehensive inpatient rehab setting.  Physiatrist is providing close team supervision and 24 hour management of active medical problems listed below.  Physiatrist and rehab team continue to assess barriers to discharge/monitor patient progress toward functional and medical goals  Care Tool:  Bathing    Body parts bathed by patient: Right arm, Left arm, Chest, Abdomen, Right upper leg, Left upper leg, Right lower leg, Left lower leg, Face, Front perineal area, Buttocks   Body parts bathed by helper: Front perineal area, Buttocks     Bathing assist Assist  Level: Set up assist     Upper Body Dressing/Undressing Upper body dressing   What is the patient wearing?: Pull over shirt    Upper body assist Assist Level: Set up assist    Lower Body Dressing/Undressing Lower body dressing      What is the patient wearing?: Underwear/pull up, Pants     Lower body assist Assist for lower body dressing: Supervision/Verbal cueing     Toileting Toileting Toileting Activity did not occur (Clothing management and hygiene only): (incontinent care)  Toileting assist Assist for toileting: Supervision/Verbal cueing     Transfers Chair/bed transfer  Transfers assist  Chair/bed transfer activity did not occur: Safety/medical concerns  Chair/bed transfer assist level: Supervision/Verbal cueing     Locomotion Ambulation   Ambulation assist   Ambulation activity did not occur: Safety/medical concerns  Assist level: Contact Guard/Touching assist Assistive device: Walker-rolling Max distance: 120'   Walk 10 feet activity   Assist  Walk 10 feet activity did not occur: Safety/medical concerns  Assist level: Contact Guard/Touching assist Assistive device: Walker-rolling   Walk 50 feet activity   Assist Walk 50 feet with 2 turns activity did not occur: Safety/medical concerns  Assist level: Contact Guard/Touching assist Assistive device: Walker-rolling    Walk 150 feet activity   Assist Walk 150 feet activity did not occur: Safety/medical concerns  Assist level: Contact Guard/Touching assist Assistive device: Walker-rolling    Walk 10 feet on uneven surface  activity   Assist Walk 10 feet on uneven surfaces activity did not occur: Safety/medical concerns  Wheelchair     Assist Will patient use wheelchair at discharge?: Yes Type of Wheelchair: Manual    Wheelchair assist level: Supervision/Verbal cueing Max wheelchair distance: 150 ft    Wheelchair 50 feet with 2 turns activity    Assist     Wheelchair 50 feet with 2 turns activity did not occur: Safety/medical concerns   Assist Level: Supervision/Verbal cueing   Wheelchair 150 feet activity     Assist Wheelchair 150 feet activity did not occur: Safety/medical concerns   Assist Level: Supervision/Verbal cueing     Medical Problem List and Plan: 1.  Encephalopathy/debility secondary to  Acute hypoxic respiratory failure/RUL pneumonia complicated by history of COPD oxygen dependent. Continue nebulizers as directed  Continue CIR  Plan for d/c tomorrow  Will see patient for transitional care management in 1-2 weeks post-discharge  2.  DVT Prophylaxis/Anticoagulation: Lovenox. Monitor for any bleeding episodes 3. Pain Management:  Tylenol as needed   Added gabapentin qhs 4. Mood/delirium:  Provide emotional support 5. Neuropsych: This patient is not fully capable of making decisions on his own behalf. 6. Skin/Wound Care:   diflucan given for rash 7. Fluids/Electrolytes/Nutrition:  Routine in and out's   BMP within acceptable range except for glucose on 2/3 8. Dysphagia.  dysphagia #3 thins diet  DCed NG  IVF nightly DC'd on 1/28, monitor oral fluid intake.   9. ID/Strep Pneumo Bacteremia.  Completed ceftriaxone on 05/05/2018  10. CAD with stenting. Plavix. 11. Hypertension. Clonidine 0.1 mg daily, Toprol 50 mg daily Vitals:   05/20/18 2112 05/21/18 0344  BP:  123/78  Pulse:  78  Resp:  15  Temp:  97.9 F (36.6 C)  SpO2: 98% 97%   Controlled on 2/4 12. Diabetes mellitus with peripheral neuropathy. Levemir BID. Checking blood sugars before meals and at bedtime. Patient on Glucophage 500 mg daily prior to admission             Reduced Levemir to 10 units twice daily as we convert to orals alone, DC'd on 1/20  Glucophage to 500 mg daily started on 1/22  Labile on 2/4, will need ambulatory monitoring and potential further adjustments CBG (last 3)  Recent Labs    05/20/18 1630 05/20/18 2142 05/21/18 0651   GLUCAP 112* 181* 129*     13. History of gout. Monitor for any flareups 14. Constipation. Laxative assistance.  15.  Acute blood loss anemia  Hemoglobin 12.6 on 1/29   Continue to monitor 16.  Hypoalbuminemia  Supplement initiated 17.  Sleep disturbance  Seroquel DC'd  Melatonin started on 1/20  Ambien started on 1/21, increased on 1/23, DC'd on 1/27  Restoril started on 1/27, increased on 1/28---continue to observe  -continue gabapentin- should help with nocturnal neuropathic pain  18.  Prolonged QTC  Monitor with medications  LOS: 18 days A FACE TO FACE EVALUATION WAS PERFORMED  Ankit Lorie Phenix 05/21/2018, 8:01 AM

## 2018-05-21 NOTE — Progress Notes (Signed)
Physical Therapy Session Note  Patient Details  Name: Tytan Sandate MRN: [id number] Date of Birth: 01-20-1951  Today's Date: 05/21/2018 PT Individual Time: 24-1440 PT Individual Time Calculation (min): 25 min   Short Term Goals: Week 2:  PT Short Term Goal 1 (Week 2): Pt will ambulate 7ft with CGA and LRAD  PT Short Term Goal 2 (Week 2): Pt will propell >100dft without rest break to improve cardiovascular endurance.  PT Short Term Goal 3 (Week 2): Pt will ascend 2 step with UE support and CGA assist  PT Short Term Goal 4 (Week 2): Pt will perform bed mobiltiy with supervision assist   Skilled Therapeutic Interventions/Progress Updates:   Pt in supine and agreeable to therapy, no c/o pain. Total assist w/c transport to therapy gym for time management. Worked on dynamic standing balance while standing on foam surface w/o UE support. Performed card matching task and horseshoe tossing. Close supervision throughout w/ min assist to correct 1 LOB. Emphasis on maintaining balance w/ lateral weight shifting, trunk rotation, and forward trunk leans. Pt requesting to self-propel w/c back to room for endurance training. Ended session in supine, all needs in reach.  Therapy Documentation Precautions:  Precautions Precautions: Fall Precaution Comments:   Restrictions Weight Bearing Restrictions: No   Therapy/Group: Individual Therapy  Pauleen Goleman Clent Demark 05/21/2018, 2:39 PM

## 2018-05-22 LAB — GLUCOSE, CAPILLARY: Glucose-Capillary: 135 mg/dL — ABNORMAL HIGH (ref 70–99)

## 2018-05-22 MED ORDER — ALPRAZOLAM 0.25 MG PO TABS: 0.1250 mg | ORAL_TABLET | Freq: Four times a day (QID) | ORAL | 0 refills | Status: DC | PRN

## 2018-05-22 MED ORDER — METFORMIN HCL 500 MG PO TABS: 250.0000 mg | ORAL_TABLET | Freq: Every day | ORAL | 1 refills | Status: DC

## 2018-05-22 MED ORDER — CLOPIDOGREL BISULFATE 75 MG PO TABS: 75.0000 mg | ORAL_TABLET | Freq: Every day | ORAL | 1 refills | Status: DC

## 2018-05-22 MED ORDER — MELATONIN 3 MG PO TABS: 3.0000 mg | ORAL_TABLET | Freq: Every day | ORAL | 0 refills | Status: DC

## 2018-05-22 MED ORDER — METOPROLOL TARTRATE 25 MG PO TABS: 25.0000 mg | ORAL_TABLET | Freq: Two times a day (BID) | ORAL | 0 refills | Status: DC

## 2018-05-22 MED ORDER — ADULT MULTIVITAMIN W/MINERALS CH: 1.0000 | ORAL_TABLET | Freq: Every day | ORAL | Status: DC

## 2018-05-22 MED ORDER — LIDOCAINE 5 % EX PTCH: 1.0000 | MEDICATED_PATCH | CUTANEOUS | 0 refills | Status: DC

## 2018-05-22 MED ORDER — LORATADINE 10 MG PO TABS: 10.0000 mg | ORAL_TABLET | Freq: Every day | ORAL | 0 refills | Status: DC

## 2018-05-22 MED ORDER — ESOMEPRAZOLE MAGNESIUM 20 MG PO CPDR: 20.0000 mg | DELAYED_RELEASE_CAPSULE | Freq: Every day | ORAL | 0 refills | Status: DC

## 2018-05-22 MED ORDER — COLCHICINE 0.6 MG PO TABS: 0.6000 mg | ORAL_TABLET | Freq: Every day | ORAL | 0 refills | Status: DC

## 2018-05-22 NOTE — Progress Notes (Signed)
Social Work  Discharge Note  The overall goal for the admission was met for:   Discharge location: Yes - returning to his home with family to provide 24/7 supervision/ CGA  Length of Stay: Yes - 19 days  Discharge activity level: Yes - supervision to mod independent  Home/community participation: Yes  Services provided included: MD, RD, PT, OT, SLP, RN, TR, Pharmacy and SW  Financial Services: Medicare and Private Insurance: Generic Commercial  Follow-up services arranged: Home Health: PT, OT, ST via [facility] and Patient/Family has no preference for HH/DME agencies  Pt was able to get recommended rolling walker and tub bench from family members.  Comments (or additional information):  Contact #s:  Pt @ [phone]  Daughter, Vennie Homans @ [phone]  Patient/Family verbalized understanding of follow-up arrangements: Yes  Individual responsible for coordination of the follow-up plan: pt  Confirmed correct DME delivered: Lennart Pall 05/22/2018    Graciemae Delisle

## 2018-05-22 NOTE — Discharge Instructions (Signed)
Inpatient Rehab Discharge Instructions  Paul [name] Discharge date and time: No discharge date for patient encounter.   Activities/Precautions/ Functional Status: Activity: activity as tolerated Diet:  Wound Care: none needed Functional status:  ___ No restrictions     ___ Walk up steps independently ___ 24/7 supervision/assistance   ___ Walk up steps with assistance ___ Intermittent supervision/assistance  ___ Bathe/dress independently ___ Walk with walker     __x_ Bathe/dress with assistance ___ Walk Independently    ___ Shower independently ___ Walk with assistance    ___ Shower with assistance ___ No alcohol     ___ Return to work/school ________     COMMUNITY REFERRALS UPON DISCHARGE:    Home Health:   PT     OT     ST                      Agency:  [facility] Phone: [phone]        Special Instructions: No smoking, drinking or driving  Continue supplemental oxygen as prior to admission  Resume Advair and Spiriva as prior to admission.   My questions have been answered and I understand these instructions. I will adhere to these goals and the provided educational materials after my discharge from the hospital.  Patient/Caregiver Signature _______________________________ Date __________  Clinician Signature _______________________________________ Date __________  Please bring this form and your medication list with you to all your follow-up doctor's appointments.

## 2018-05-22 NOTE — Progress Notes (Signed)
Patient discharged with family member, no questions or concerns at this time.

## 2018-05-24 ENCOUNTER — Telehealth: Payer: Self-pay | Admitting: *Deleted

## 2018-05-24 DIAGNOSIS — I10 Essential (primary) hypertension: Secondary | ICD-10-CM | POA: Diagnosis not present

## 2018-05-24 DIAGNOSIS — J9611 Chronic respiratory failure with hypoxia: Secondary | ICD-10-CM | POA: Diagnosis not present

## 2018-05-24 DIAGNOSIS — K5900 Constipation, unspecified: Secondary | ICD-10-CM | POA: Diagnosis not present

## 2018-05-24 DIAGNOSIS — D649 Anemia, unspecified: Secondary | ICD-10-CM | POA: Diagnosis not present

## 2018-05-24 DIAGNOSIS — Z7984 Long term (current) use of oral hypoglycemic drugs: Secondary | ICD-10-CM | POA: Diagnosis not present

## 2018-05-24 DIAGNOSIS — J449 Chronic obstructive pulmonary disease, unspecified: Secondary | ICD-10-CM | POA: Diagnosis not present

## 2018-05-24 DIAGNOSIS — R1310 Dysphagia, unspecified: Secondary | ICD-10-CM | POA: Diagnosis not present

## 2018-05-24 DIAGNOSIS — Z8701 Personal history of pneumonia (recurrent): Secondary | ICD-10-CM | POA: Diagnosis not present

## 2018-05-24 DIAGNOSIS — Z9181 History of falling: Secondary | ICD-10-CM | POA: Diagnosis not present

## 2018-05-24 DIAGNOSIS — Z7902 Long term (current) use of antithrombotics/antiplatelets: Secondary | ICD-10-CM | POA: Diagnosis not present

## 2018-05-24 DIAGNOSIS — I2510 Atherosclerotic heart disease of native coronary artery without angina pectoris: Secondary | ICD-10-CM | POA: Diagnosis not present

## 2018-05-24 DIAGNOSIS — E119 Type 2 diabetes mellitus without complications: Secondary | ICD-10-CM | POA: Diagnosis not present

## 2018-05-24 DIAGNOSIS — Z955 Presence of coronary angioplasty implant and graft: Secondary | ICD-10-CM | POA: Diagnosis not present

## 2018-05-24 DIAGNOSIS — F17210 Nicotine dependence, cigarettes, uncomplicated: Secondary | ICD-10-CM | POA: Diagnosis not present

## 2018-05-24 NOTE — Telephone Encounter (Signed)
Transitional Care call-I spoke with Mr Kashuba    1. Are you/is patient experiencing any problems since coming home? Are there any questions regarding any aspect of care? NO 2. Are there any questions regarding medications administration/dosing? Are meds being taken as prescribed? Patient should review meds with caller to confirm YES 3. Have there been any falls? NO 4. Has Home Health been to the house and/or have they contacted you? If not, have you tried to contact them? Can we help you contact them?THEY ARE DUE TO MAKE FIRST VISIT TODAY BETWEEN 9&11 5. Are bowels and bladder emptying properly? Are there any unexpected incontinence issues? If applicable, is patient following bowel/bladder programs?NO 6. Any fevers, problems with breathing, unexpected pain? NO 7. Are there any skin problems or new areas of breakdown?NO 8. Has the patient/family member arranged specialty MD follow up (ie cardiology/neurology/renal/surgical/etc)?  Can we help arrange?APPT GIVEN TO SEE [facility] NP FOR DR PATEL 9. Does the patient need any other services or support that we can help arrange? NO 10. Are caregivers following through as expected in assisting the patient? YES 11. Has the patient quit smoking, drinking alcohol, or using drugs as recommended? YES  Appointment time Tuesday 06/04/18 @9 :20  Arrive by 9:00 to see Danella Sensing NP then Dr Posey Pronto thereafter [address]

## 2018-05-27 DIAGNOSIS — I10 Essential (primary) hypertension: Secondary | ICD-10-CM | POA: Diagnosis not present

## 2018-05-27 DIAGNOSIS — R1310 Dysphagia, unspecified: Secondary | ICD-10-CM | POA: Diagnosis not present

## 2018-05-27 DIAGNOSIS — J449 Chronic obstructive pulmonary disease, unspecified: Secondary | ICD-10-CM | POA: Diagnosis not present

## 2018-05-27 DIAGNOSIS — J9611 Chronic respiratory failure with hypoxia: Secondary | ICD-10-CM | POA: Diagnosis not present

## 2018-05-27 DIAGNOSIS — E119 Type 2 diabetes mellitus without complications: Secondary | ICD-10-CM | POA: Diagnosis not present

## 2018-05-27 DIAGNOSIS — I2510 Atherosclerotic heart disease of native coronary artery without angina pectoris: Secondary | ICD-10-CM | POA: Diagnosis not present

## 2018-05-28 DIAGNOSIS — J9611 Chronic respiratory failure with hypoxia: Secondary | ICD-10-CM | POA: Diagnosis not present

## 2018-05-28 DIAGNOSIS — E119 Type 2 diabetes mellitus without complications: Secondary | ICD-10-CM | POA: Diagnosis not present

## 2018-05-28 DIAGNOSIS — E878 Other disorders of electrolyte and fluid balance, not elsewhere classified: Secondary | ICD-10-CM | POA: Diagnosis not present

## 2018-05-28 DIAGNOSIS — A403 Sepsis due to Streptococcus pneumoniae: Secondary | ICD-10-CM | POA: Diagnosis not present

## 2018-05-28 DIAGNOSIS — G629 Polyneuropathy, unspecified: Secondary | ICD-10-CM | POA: Diagnosis not present

## 2018-05-28 DIAGNOSIS — I2510 Atherosclerotic heart disease of native coronary artery without angina pectoris: Secondary | ICD-10-CM | POA: Diagnosis not present

## 2018-05-28 DIAGNOSIS — R945 Abnormal results of liver function studies: Secondary | ICD-10-CM | POA: Diagnosis not present

## 2018-05-28 DIAGNOSIS — G6289 Other specified polyneuropathies: Secondary | ICD-10-CM | POA: Diagnosis not present

## 2018-05-28 DIAGNOSIS — G9340 Encephalopathy, unspecified: Secondary | ICD-10-CM | POA: Diagnosis not present

## 2018-05-28 DIAGNOSIS — J449 Chronic obstructive pulmonary disease, unspecified: Secondary | ICD-10-CM | POA: Diagnosis not present

## 2018-05-28 DIAGNOSIS — I10 Essential (primary) hypertension: Secondary | ICD-10-CM | POA: Diagnosis not present

## 2018-05-28 DIAGNOSIS — R1310 Dysphagia, unspecified: Secondary | ICD-10-CM | POA: Diagnosis not present

## 2018-05-28 DIAGNOSIS — E118 Type 2 diabetes mellitus with unspecified complications: Secondary | ICD-10-CM | POA: Diagnosis not present

## 2018-05-28 DIAGNOSIS — J9620 Acute and chronic respiratory failure, unspecified whether with hypoxia or hypercapnia: Secondary | ICD-10-CM | POA: Diagnosis not present

## 2018-05-28 DIAGNOSIS — Z9861 Coronary angioplasty status: Secondary | ICD-10-CM | POA: Diagnosis not present

## 2018-05-28 DIAGNOSIS — K5900 Constipation, unspecified: Secondary | ICD-10-CM | POA: Diagnosis not present

## 2018-05-28 DIAGNOSIS — R5383 Other fatigue: Secondary | ICD-10-CM | POA: Diagnosis not present

## 2018-05-28 DIAGNOSIS — E1142 Type 2 diabetes mellitus with diabetic polyneuropathy: Secondary | ICD-10-CM | POA: Insufficient documentation

## 2018-05-29 DIAGNOSIS — E119 Type 2 diabetes mellitus without complications: Secondary | ICD-10-CM | POA: Diagnosis not present

## 2018-05-29 DIAGNOSIS — J449 Chronic obstructive pulmonary disease, unspecified: Secondary | ICD-10-CM | POA: Diagnosis not present

## 2018-05-29 DIAGNOSIS — J9611 Chronic respiratory failure with hypoxia: Secondary | ICD-10-CM | POA: Diagnosis not present

## 2018-05-29 DIAGNOSIS — I2510 Atherosclerotic heart disease of native coronary artery without angina pectoris: Secondary | ICD-10-CM | POA: Diagnosis not present

## 2018-05-29 DIAGNOSIS — I10 Essential (primary) hypertension: Secondary | ICD-10-CM | POA: Diagnosis not present

## 2018-05-29 DIAGNOSIS — R1310 Dysphagia, unspecified: Secondary | ICD-10-CM | POA: Diagnosis not present

## 2018-05-30 DIAGNOSIS — E119 Type 2 diabetes mellitus without complications: Secondary | ICD-10-CM | POA: Diagnosis not present

## 2018-05-30 DIAGNOSIS — I2510 Atherosclerotic heart disease of native coronary artery without angina pectoris: Secondary | ICD-10-CM | POA: Diagnosis not present

## 2018-05-30 DIAGNOSIS — J9611 Chronic respiratory failure with hypoxia: Secondary | ICD-10-CM | POA: Diagnosis not present

## 2018-05-30 DIAGNOSIS — J449 Chronic obstructive pulmonary disease, unspecified: Secondary | ICD-10-CM | POA: Diagnosis not present

## 2018-05-30 DIAGNOSIS — I10 Essential (primary) hypertension: Secondary | ICD-10-CM | POA: Diagnosis not present

## 2018-05-30 DIAGNOSIS — R1310 Dysphagia, unspecified: Secondary | ICD-10-CM | POA: Diagnosis not present

## 2018-05-31 DIAGNOSIS — I2510 Atherosclerotic heart disease of native coronary artery without angina pectoris: Secondary | ICD-10-CM | POA: Diagnosis not present

## 2018-05-31 DIAGNOSIS — J449 Chronic obstructive pulmonary disease, unspecified: Secondary | ICD-10-CM | POA: Diagnosis not present

## 2018-05-31 DIAGNOSIS — I10 Essential (primary) hypertension: Secondary | ICD-10-CM | POA: Diagnosis not present

## 2018-05-31 DIAGNOSIS — J9611 Chronic respiratory failure with hypoxia: Secondary | ICD-10-CM | POA: Diagnosis not present

## 2018-05-31 DIAGNOSIS — R1310 Dysphagia, unspecified: Secondary | ICD-10-CM | POA: Diagnosis not present

## 2018-05-31 DIAGNOSIS — E119 Type 2 diabetes mellitus without complications: Secondary | ICD-10-CM | POA: Diagnosis not present

## 2018-06-03 DIAGNOSIS — R1310 Dysphagia, unspecified: Secondary | ICD-10-CM | POA: Diagnosis not present

## 2018-06-03 DIAGNOSIS — I2510 Atherosclerotic heart disease of native coronary artery without angina pectoris: Secondary | ICD-10-CM | POA: Diagnosis not present

## 2018-06-03 DIAGNOSIS — J449 Chronic obstructive pulmonary disease, unspecified: Secondary | ICD-10-CM | POA: Diagnosis not present

## 2018-06-03 DIAGNOSIS — I10 Essential (primary) hypertension: Secondary | ICD-10-CM | POA: Diagnosis not present

## 2018-06-03 DIAGNOSIS — J9611 Chronic respiratory failure with hypoxia: Secondary | ICD-10-CM | POA: Diagnosis not present

## 2018-06-03 DIAGNOSIS — E119 Type 2 diabetes mellitus without complications: Secondary | ICD-10-CM | POA: Diagnosis not present

## 2018-06-04 ENCOUNTER — Encounter: Payer: Self-pay | Admitting: Registered Nurse

## 2018-06-04 ENCOUNTER — Encounter: Payer: Medicare Other | Attending: Registered Nurse | Admitting: Registered Nurse

## 2018-06-04 VITALS — BP 165/92 | HR 98 | Ht 66.0 in | Wt 179.0 lb

## 2018-06-04 DIAGNOSIS — J440 Chronic obstructive pulmonary disease with acute lower respiratory infection: Secondary | ICD-10-CM | POA: Diagnosis not present

## 2018-06-04 DIAGNOSIS — E1165 Type 2 diabetes mellitus with hyperglycemia: Secondary | ICD-10-CM | POA: Diagnosis not present

## 2018-06-04 DIAGNOSIS — Z9981 Dependence on supplemental oxygen: Secondary | ICD-10-CM | POA: Insufficient documentation

## 2018-06-04 DIAGNOSIS — J9601 Acute respiratory failure with hypoxia: Secondary | ICD-10-CM | POA: Insufficient documentation

## 2018-06-04 DIAGNOSIS — G9340 Encephalopathy, unspecified: Secondary | ICD-10-CM

## 2018-06-04 DIAGNOSIS — E1151 Type 2 diabetes mellitus with diabetic peripheral angiopathy without gangrene: Secondary | ICD-10-CM | POA: Diagnosis not present

## 2018-06-04 DIAGNOSIS — I2510 Atherosclerotic heart disease of native coronary artery without angina pectoris: Secondary | ICD-10-CM | POA: Insufficient documentation

## 2018-06-04 DIAGNOSIS — E1142 Type 2 diabetes mellitus with diabetic polyneuropathy: Secondary | ICD-10-CM | POA: Diagnosis not present

## 2018-06-04 DIAGNOSIS — J189 Pneumonia, unspecified organism: Secondary | ICD-10-CM | POA: Diagnosis not present

## 2018-06-04 DIAGNOSIS — I10 Essential (primary) hypertension: Secondary | ICD-10-CM

## 2018-06-04 DIAGNOSIS — E669 Obesity, unspecified: Secondary | ICD-10-CM | POA: Diagnosis not present

## 2018-06-04 DIAGNOSIS — R7881 Bacteremia: Secondary | ICD-10-CM | POA: Diagnosis not present

## 2018-06-04 DIAGNOSIS — Z6828 Body mass index (BMI) 28.0-28.9, adult: Secondary | ICD-10-CM | POA: Diagnosis not present

## 2018-06-04 DIAGNOSIS — G4733 Obstructive sleep apnea (adult) (pediatric): Secondary | ICD-10-CM | POA: Diagnosis not present

## 2018-06-04 DIAGNOSIS — Z955 Presence of coronary angioplasty implant and graft: Secondary | ICD-10-CM | POA: Insufficient documentation

## 2018-06-04 DIAGNOSIS — Z8249 Family history of ischemic heart disease and other diseases of the circulatory system: Secondary | ICD-10-CM | POA: Insufficient documentation

## 2018-06-04 DIAGNOSIS — J449 Chronic obstructive pulmonary disease, unspecified: Secondary | ICD-10-CM | POA: Diagnosis not present

## 2018-06-04 DIAGNOSIS — E118 Type 2 diabetes mellitus with unspecified complications: Secondary | ICD-10-CM | POA: Diagnosis not present

## 2018-06-04 DIAGNOSIS — J9600 Acute respiratory failure, unspecified whether with hypoxia or hypercapnia: Secondary | ICD-10-CM | POA: Diagnosis not present

## 2018-06-04 DIAGNOSIS — E785 Hyperlipidemia, unspecified: Secondary | ICD-10-CM | POA: Diagnosis not present

## 2018-06-04 DIAGNOSIS — Z87891 Personal history of nicotine dependence: Secondary | ICD-10-CM | POA: Insufficient documentation

## 2018-06-04 DIAGNOSIS — B953 Streptococcus pneumoniae as the cause of diseases classified elsewhere: Secondary | ICD-10-CM

## 2018-06-04 DIAGNOSIS — M109 Gout, unspecified: Secondary | ICD-10-CM | POA: Insufficient documentation

## 2018-06-04 DIAGNOSIS — G629 Polyneuropathy, unspecified: Secondary | ICD-10-CM | POA: Diagnosis not present

## 2018-06-04 DIAGNOSIS — J040 Acute laryngitis: Secondary | ICD-10-CM | POA: Diagnosis not present

## 2018-06-04 DIAGNOSIS — K5900 Constipation, unspecified: Secondary | ICD-10-CM | POA: Diagnosis not present

## 2018-06-04 NOTE — Progress Notes (Signed)
Subjective:    Patient ID: Paul [name], male    DOB: 12/25/1950, 68 y.o.   MRN: [id number]  HPI: Paul [name] is a 68 y.o. male who is here for Transitional Care Visit. He presented to [facility] Emergency Department on 04/21/2018 after being found confused and dyspneic by family. He was intubated  and found to have RUL pneumonia, strep pneumoniae bactremia. He was extubated on 04/27/2018.  Chest X-ray: IMPRESSION: 1. Dense consolidation in the right upper lobe potentially from pneumonia, pulmonary hemorrhage, or atelectasis. Fullness of the right hilum could be from mass or adenopathy. Correlate with patient's symptoms. If treatment for pneumonia is indicated, I would recommend followup PA and lateral chest X-ray in 3-4 weeks following trial of antibiotic therapy to ensure resolution and exclude underlying malignancy. Of further imaging workup is indicated at this time, CT or CT angiography of the chest could be utilized.  CT  Chest Without Contrast:  IMPRESSION: 1. RIGHT UPPER lobe consolidation with some degree of RIGHT UPPER and RIGHT middle lobe atelectasis. This is nonspecific but most likely represents infection/pneumonia. Radiographic follow-up to resolution recommended. 2. Mild tree-in-bud opacities within bilateral LOWER lobes, suspicious for infection. 3. Splenomegaly 4. Coronary artery and Aortic Atherosclerosis (ICD10-I70.0).  Mr. Currier was admitted to [facility] on 05/03/2018 and discharged on 05/22/2018. He was discharged home with [facility].  Today he is here for transitional care visit. He states his pain is located in his lower back pain radiating into his left lower extremity. He rates his pain 3. His current exercise regime is walking with walker. Also reports good appetite. He is following swallowing precautions and remains on  Dysphagia 3 diet.     Pain Inventory Average Pain 5 Pain Right Now 3 My pain is burning and aching  In the  last 24 hours, has pain interfered with the following? General activity 10 Relation with others 0 Enjoyment of life 9 What TIME of day is your pain at its worst? evening Sleep (in general) Fair  Pain is worse with: walking, bending, sitting and standing Pain improves with: n/a Relief from Meds: n/a  Mobility walk with assistance use a walker ability to climb steps?  yes do you drive?  no needs help with transfers  Function retired  Neuro/Psych bladder control problems weakness numbness tingling  Prior Studies transitional care  Physicians involved in your care transitional care   Family History  Problem Relation Age of Onset  . Heart disease Sister   . Heart attack Brother   . Emphysema Father        smoked  . Aneurysm Father    Social History   Socioeconomic History  . Marital status: Divorced    Spouse name: Not on file  . Number of children: Not on file  . Years of education: Not on file  . Highest education level: Not on file  Occupational History  . Not on file  Social Needs  . Financial resource strain: Not on file  . Food insecurity:    Worry: Not on file    Inability: Not on file  . Transportation needs:    Medical: Not on file    Non-medical: Not on file  Tobacco Use  . Smoking status: Current Some Day Smoker    Packs/day: 1.00    Years: 40.00    Pack years: 40.00    Types: Cigarettes  . Smokeless tobacco: Never Used  . Tobacco comment: 02/18/14- smokes occ cig maybe  3 x per wk  Substance and Sexual Activity  . Alcohol use: Yes    Alcohol/week: 0.0 standard drinks    Comment: social 2-3 drink a week  . Drug use: No  . Sexual activity: Not Currently  Lifestyle  . Physical activity:    Days per week: Not on file    Minutes per session: Not on file  . Stress: Not on file  Relationships  . Social connections:    Talks on phone: Not on file    Gets together: Not on file    Attends religious service: Not on file    Active member of  club or organization: Not on file    Attends meetings of clubs or organizations: Not on file    Relationship status: Not on file  Other Topics Concern  . Not on file  Social History Narrative   He is a 68 y.o. divorced father of 12, grandfather 1.   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   He quit smoking in August 2013, after smoking a pack a day for roughly 40 years.   He drinks socially 2-3 drinks a week only.   He does not get routine exercise, mostly due to 2 fatigue and dyspnea. Otherwise been relatively sedentary.   Past Surgical History:  Procedure Laterality Date  . CERVICAL SPINE SURGERY  2012  . CORONARY ANGIOPLASTY WITH STENT PLACEMENT  11/23/2011   "1; first one"  . LEFT HEART CATHETERIZATION WITH CORONARY ANGIOGRAM N/A 11/23/2011   Procedure: LEFT HEART CATHETERIZATION WITH CORONARY ANGIOGRAM;  Surgeon: Leonie Man, MD;  Location: [facility] CATH LAB;  Service: Cardiovascular;  Laterality: N/A;   Past Medical History:  Diagnosis Date  . CAD S/P percutaneous coronary angioplasty -- D1, Xience Xpedition DES 2.5 mm x 33 mm 11/24/2011   a) Mild-to-moderate 30-40% lesions in the RCA, LAD and Circumflex. b) CULPRIT LESION: long tubular 70-80% lesion in D1 with FFR of 0.7 --> PCI w/ Xience Xpedition DES 2.5 mm x 30 mm (2.65 MM); c) Lexiscan Myoview 11/2013: No Ischemia or Infarct (Inferior Gut Attenuation) EF 63%.  Marland Kitchen COPD (chronic obstructive pulmonary disease) ([place])    "don't have full case of it; I'm right there at it"  . Essential hypertension 11/24/2011  . Exertional dyspnea, chronic   . Gout   . History of Unstable angina 11/24/2011   Referred for cardiac catheterization  . Hyperlipidemia with target LDL less than 70 11/24/2011  . Obesity (BMI 30.0-34.9)   . OSA (obstructive sleep apnea), uses oxygen at home did not tolerate cpap 11/24/2011   BP (!)  165/92   Pulse 98   Ht 5\' 6"  (1.676 m)   Wt 179 lb (81.2 kg)   SpO2 93%   BMI 28.89 kg/m   Opioid Risk Score:   Fall Risk Score:  `1  Depression screen PHQ 2/9  No flowsheet data found.   Review of Systems  Constitutional: Negative.   HENT: Negative.   Eyes: Negative.   Respiratory: Negative.   Cardiovascular: Negative.   Gastrointestinal: Negative.   Genitourinary: Negative.   Musculoskeletal: Positive for gait problem.  Skin: Negative.   Allergic/Immunologic: Negative.   Neurological: Positive for weakness and numbness.       Tingling  Hematological: Negative.   Psychiatric/Behavioral: Negative.   All other systems reviewed and are negative.      Objective:  Physical Exam Vitals signs and nursing note reviewed.  Constitutional:      Appearance: Normal appearance.  Neck:     Musculoskeletal: Normal range of motion and neck supple.  Cardiovascular:     Rate and Rhythm: Normal rate and regular rhythm.     Pulses: Normal pulses.     Heart sounds: Normal heart sounds.  Pulmonary:     Effort: Pulmonary effort is normal.     Breath sounds: Normal breath sounds.  Musculoskeletal:     Comments: Normal Muscle Bulk and Muscle Testing Reveals:  Upper Extremities: Right: Full ROM and Muscle Strength 5/5 Left: Decreased ROM 90 Degrees and Muscle Strength 5/5  Lower Extremities: Full ROM and Muscle Strength 5/5 Left Lower Extremity Flexion Produces Pain into extremity Arises from Table with ease using walker for support Narrow Based Gait   Skin:    General: Skin is warm and dry.  Neurological:     Mental Status: He is alert and oriented to person, place, and time.  Psychiatric:        Mood and Affect: Mood normal.        Behavior: Behavior normal.           Assessment & Plan:  1. Encephalopathy/Bacteremia due to Streptococcus Pneumoniae/Acute Respiratory Failure with Hypoxia: Continue with Outpatient Therapies. Continue wearing oxygen at HS. Continue to  monitor.  2. Hypertension: Continue current medication regimen. PCP Following.  3. Poorly Controlled Type 2 DM with Peripheral Neuropathy: Glucose has been controlled since discharge: PCP Following: Continue gabapentin. Continue to Monitor.   30 minutes of face to face patient care time was spent during this visit. All questions were encouraged and answered.  F/U in 4- 6 weeks with Dr. Posey Pronto.

## 2018-06-05 DIAGNOSIS — I2510 Atherosclerotic heart disease of native coronary artery without angina pectoris: Secondary | ICD-10-CM | POA: Diagnosis not present

## 2018-06-05 DIAGNOSIS — I10 Essential (primary) hypertension: Secondary | ICD-10-CM | POA: Diagnosis not present

## 2018-06-05 DIAGNOSIS — J9611 Chronic respiratory failure with hypoxia: Secondary | ICD-10-CM | POA: Diagnosis not present

## 2018-06-05 DIAGNOSIS — R1310 Dysphagia, unspecified: Secondary | ICD-10-CM | POA: Diagnosis not present

## 2018-06-05 DIAGNOSIS — J449 Chronic obstructive pulmonary disease, unspecified: Secondary | ICD-10-CM | POA: Diagnosis not present

## 2018-06-05 DIAGNOSIS — E119 Type 2 diabetes mellitus without complications: Secondary | ICD-10-CM | POA: Diagnosis not present

## 2018-06-06 DIAGNOSIS — I2510 Atherosclerotic heart disease of native coronary artery without angina pectoris: Secondary | ICD-10-CM | POA: Diagnosis not present

## 2018-06-06 DIAGNOSIS — E119 Type 2 diabetes mellitus without complications: Secondary | ICD-10-CM | POA: Diagnosis not present

## 2018-06-06 DIAGNOSIS — J9611 Chronic respiratory failure with hypoxia: Secondary | ICD-10-CM | POA: Diagnosis not present

## 2018-06-06 DIAGNOSIS — I10 Essential (primary) hypertension: Secondary | ICD-10-CM | POA: Diagnosis not present

## 2018-06-06 DIAGNOSIS — R1310 Dysphagia, unspecified: Secondary | ICD-10-CM | POA: Diagnosis not present

## 2018-06-06 DIAGNOSIS — J449 Chronic obstructive pulmonary disease, unspecified: Secondary | ICD-10-CM | POA: Diagnosis not present

## 2018-06-08 DIAGNOSIS — I10 Essential (primary) hypertension: Secondary | ICD-10-CM | POA: Diagnosis not present

## 2018-06-08 DIAGNOSIS — I2510 Atherosclerotic heart disease of native coronary artery without angina pectoris: Secondary | ICD-10-CM | POA: Diagnosis not present

## 2018-06-08 DIAGNOSIS — J9611 Chronic respiratory failure with hypoxia: Secondary | ICD-10-CM | POA: Diagnosis not present

## 2018-06-08 DIAGNOSIS — J449 Chronic obstructive pulmonary disease, unspecified: Secondary | ICD-10-CM | POA: Diagnosis not present

## 2018-06-08 DIAGNOSIS — R1310 Dysphagia, unspecified: Secondary | ICD-10-CM | POA: Diagnosis not present

## 2018-06-08 DIAGNOSIS — E119 Type 2 diabetes mellitus without complications: Secondary | ICD-10-CM | POA: Diagnosis not present

## 2018-06-10 ENCOUNTER — Encounter: Payer: Self-pay | Admitting: Emergency Medicine

## 2018-06-10 ENCOUNTER — Ambulatory Visit (INDEPENDENT_AMBULATORY_CARE_PROVIDER_SITE_OTHER): Payer: Medicare Other | Admitting: Emergency Medicine

## 2018-06-10 VITALS — BP 122/68 | HR 110 | Ht 65.0 in | Wt 178.8 lb

## 2018-06-10 DIAGNOSIS — J181 Lobar pneumonia, unspecified organism: Secondary | ICD-10-CM | POA: Diagnosis not present

## 2018-06-10 DIAGNOSIS — M5442 Lumbago with sciatica, left side: Secondary | ICD-10-CM | POA: Diagnosis not present

## 2018-06-10 DIAGNOSIS — M9903 Segmental and somatic dysfunction of lumbar region: Secondary | ICD-10-CM | POA: Diagnosis not present

## 2018-06-10 DIAGNOSIS — J449 Chronic obstructive pulmonary disease, unspecified: Secondary | ICD-10-CM

## 2018-06-10 DIAGNOSIS — M9901 Segmental and somatic dysfunction of cervical region: Secondary | ICD-10-CM | POA: Diagnosis not present

## 2018-06-10 DIAGNOSIS — M47812 Spondylosis without myelopathy or radiculopathy, cervical region: Secondary | ICD-10-CM | POA: Diagnosis not present

## 2018-06-10 MED ORDER — TIOTROPIUM BROMIDE-OLODATEROL 2.5-2.5 MCG/ACT IN AERS: 2.0000 | INHALATION_SPRAY | Freq: Every day | RESPIRATORY_TRACT | 0 refills | Status: DC

## 2018-06-10 NOTE — Assessment & Plan Note (Signed)
Pneumococcal pneumonia and bacteremia with respiratory failure and critical care illness.  He is recovering.  Needs to continue with his rehabilitation.  We will attempt to optimize his COPD management.  He needs a repeat chest x-ray next visit to ensure clearance of his infiltrate.

## 2018-06-10 NOTE — Progress Notes (Signed)
Subjective:    Patient ID: Paul [name], male    DOB: 10-Feb-1951, 68 y.o.   MRN: [id number]  HPI 68 year old obese former smoker (40 pack years) with a history of COPD and associated chronic hypoxemic respiratory failure, coronary disease, hypertension, obstructive sleep apnea not on CPAP.  He was admitted in mid January with a right upper lobe pneumonia, associated pneumococcal bacteremia and DKA.  He was also RSV positive.  Course complicated by an agitated delirium.  He was discharged initially to rehab and is now been discharged home.  He is currently using Symbicort, Advair, DuoNeb which he uses approximately .  He is improving, is able to walk with a walker. He does have some exertional SOB. Most recent imaging was 1/17 > shows the RUL infiltrate. Occasional cough with sputum, white mucous.  PFT from 2015 reviewed by me  > mild AFL.    Review of Systems  Constitutional: Positive for fever. Negative for unexpected weight change.  HENT: Positive for congestion and sinus pressure. Negative for dental problem, ear pain, nosebleeds, postnasal drip, rhinorrhea, sneezing, sore throat and trouble swallowing.   Eyes: Negative for redness and itching.  Respiratory: Positive for shortness of breath. Negative for cough, chest tightness and wheezing.   Cardiovascular: Negative for palpitations and leg swelling.  Gastrointestinal: Negative for nausea and vomiting.  Genitourinary: Negative for dysuria.  Musculoskeletal: Negative for joint swelling.  Skin: Negative for rash.  Allergic/Immunologic: Negative.  Negative for environmental allergies, food allergies and immunocompromised state.  Neurological: Negative for headaches.  Hematological: Bruises/bleeds easily.  Psychiatric/Behavioral: Negative for dysphoric mood. The patient is not nervous/anxious.    Past Medical History:  Diagnosis Date  . CAD S/P percutaneous coronary angioplasty -- D1, Xience Xpedition DES 2.5 mm x 33 mm 11/24/2011   a)  Mild-to-moderate 30-40% lesions in the RCA, LAD and Circumflex. b) CULPRIT LESION: long tubular 70-80% lesion in D1 with FFR of 0.7 --> PCI w/ Xience Xpedition DES 2.5 mm x 30 mm (2.65 MM); c) Lexiscan Myoview 11/2013: No Ischemia or Infarct (Inferior Gut Attenuation) EF 63%.  Marland Kitchen COPD (chronic obstructive pulmonary disease) ([place])    "don't have full case of it; I'm right there at it"  . Essential hypertension 11/24/2011  . Exertional dyspnea, chronic   . Gout   . History of Unstable angina 11/24/2011   Referred for cardiac catheterization  . Hyperlipidemia with target LDL less than 70 11/24/2011  . Obesity (BMI 30.0-34.9)   . OSA (obstructive sleep apnea), uses oxygen at home did not tolerate cpap 11/24/2011     Family History  Problem Relation Age of Onset  . Heart disease Sister   . Heart attack Brother   . Emphysema Father        smoked  . Aneurysm Father      Social History   Socioeconomic History  . Marital status: Divorced    Spouse name: Not on file  . Number of children: Not on file  . Years of education: Not on file  . Highest education level: Not on file  Occupational History  . Not on file  Social Needs  . Financial resource strain: Not on file  . Food insecurity:    Worry: Not on file    Inability: Not on file  . Transportation needs:    Medical: Not on file    Non-medical: Not on file  Tobacco Use  . Smoking status: Former Smoker    Packs/day: 1.00    Years: 40.00  Pack years: 40.00    Types: Cigarettes    Last attempt to quit: 06/10/2016    Years since quitting: 2.0  . Smokeless tobacco: Never Used  . Tobacco comment: 02/18/14- smokes occ cig maybe 3 x per wk  Substance and Sexual Activity  . Alcohol use: Yes    Alcohol/week: 0.0 standard drinks    Comment: social 2-3 drink a week  . Drug use: No  . Sexual activity: Not Currently  Lifestyle  . Physical activity:    Days per week: Not on file    Minutes per session: Not on file  . Stress: Not on file    Relationships  . Social connections:    Talks on phone: Not on file    Gets together: Not on file    Attends religious service: Not on file    Active member of club or organization: Not on file    Attends meetings of clubs or organizations: Not on file    Relationship status: Not on file  . Intimate partner violence:    Fear of current or ex partner: Not on file    Emotionally abused: Not on file    Physically abused: Not on file    Forced sexual activity: Not on file  Other Topics Concern  . Not on file  Social History Narrative   He is a 68 y.o. divorced father of 72, grandfather 1.   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   He quit smoking in August 2013, after smoking a pack a day for roughly 40 years.   He drinks socially 2-3 drinks a week only.   He does not get routine exercise, mostly due to 2 fatigue and dyspnea. Otherwise been relatively sedentary.   Asbestos exposure through work [place]  Allergies  Allergen Reactions  . Codeine Itching  . Duloxetine Hcl Other (See Comments)    Took at night and still felt very lethargic the following day.  (malaise)     Outpatient Medications Prior to Visit  Medication Sig Dispense Refill  . albuterol (PROVENTIL) (2.5 MG/3ML) 0.083% nebulizer solution Take 3 mLs (2.5 mg total) by nebulization every 4 (four) hours as needed for wheezing or shortness of breath. 75 mL 12  . ALPRAZolam (XANAX) 0.25 MG tablet Take 0.5 tablets (0.125 mg total) by mouth every 6 (six) hours as needed for anxiety. 30 tablet 0  . arformoterol (BROVANA) 15 MCG/2ML NEBU Take 2 mLs (15 mcg total) by nebulization 2 (two) times daily. 120 mL   . budesonide (PULMICORT) 0.5 MG/2ML nebulizer solution Take 2 mLs (0.5 mg total) by nebulization 2 (two) times daily.  12  . budesonide-formoterol (SYMBICORT) 160-4.5 MCG/ACT inhaler  Inhale 2 puffs into the lungs 2 (two) times daily.    . clopidogrel (PLAVIX) 75 MG tablet Take 1 tablet (75 mg total) by mouth daily. 30 tablet 1  . colchicine 0.6 MG tablet Take 1 tablet (0.6 mg total) by mouth daily. 30 tablet 0  . esomeprazole (NEXIUM) 20 MG capsule Take 1 capsule (20 mg total) by mouth daily at 12 noon. 30 capsule 0  . Fluticasone-Salmeterol (ADVAIR) 250-50 MCG/DOSE AEPB Inhale 1 puff into the lungs 2 (two) times daily.    Marland Kitchen gabapentin (NEURONTIN) 300 MG capsule Take 300 mg by mouth at bedtime.    Marland Kitchen guaiFENesin (MUCINEX) 600 MG 12 hr tablet Take 2  tablets (1,200 mg total) by mouth 2 (two) times daily.    Marland Kitchen ipratropium-albuterol (DUONEB) 0.5-2.5 (3) MG/3ML SOLN Take 3 mLs by nebulization 3 (three) times daily. 360 mL   . lidocaine (LIDODERM) 5 % Place 1 patch onto the skin daily. Remove & Discard patch within 12 hours or as directed by MD 30 patch 0  . loratadine (CLARITIN) 10 MG tablet Take 1 tablet (10 mg total) by mouth daily. 30 tablet 0  . metFORMIN (GLUCOPHAGE) 500 MG tablet Take 0.5 tablets (250 mg total) by mouth daily with breakfast. (Patient taking differently: Take 500 mg by mouth daily with breakfast. ) 30 tablet 1  . metoprolol tartrate (LOPRESSOR) 25 MG tablet Take 1 tablet (25 mg total) by mouth 2 (two) times daily. 60 tablet 0  . tiotropium (SPIRIVA HANDIHALER) 18 MCG inhalation capsule Place 18 mcg into inhaler and inhale daily.    . Melatonin 3 MG TABS Take 1 tablet (3 mg total) by mouth at bedtime. (Patient not taking: Reported on 06/10/2018) 30 tablet 0  . Multiple Vitamin (MULTIVITAMIN WITH MINERALS) TABS tablet Take 1 tablet by mouth daily. (Patient not taking: Reported on 06/10/2018)    . nitroGLYCERIN (NITROSTAT) 0.4 MG SL tablet Place 1 tablet (0.4 mg total) under the tongue every 5 (five) minutes as needed for chest pain. (Patient not taking: Reported on 04/22/2018) 25 tablet 4   No facility-administered medications prior to visit.         Objective:    Physical Exam Vitals:   06/10/18 1617 06/10/18 1628  BP:  122/68  Pulse:  (!) 110  SpO2:  96%  Weight: 178 lb 12.8 oz (81.1 kg)   Height: 5\' 5"  (1.651 m)    Gen: Pleasant, well-nourished, in no distress,  normal affect  ENT: No lesions,  mouth clear,  oropharynx clear, no postnasal drip, hoarse voice  Neck: No JVD, no stridor  Lungs: No use of accessory muscles, no crackles or wheezing on normal respiration, no wheeze on forced expiration  Cardiovascular: RRR, heart sounds normal, no murmur or gallops, no peripheral edema  Musculoskeletal: No deformities, no cyanosis or clubbing  Neuro: alert, awake, non focal  Skin: Warm, no lesions or rash      Assessment & Plan:  OSA on CPAP Not currently on CPAP, will revisit going forward  CAP (community acquired pneumonia) Pneumococcal pneumonia and bacteremia with respiratory failure and critical care illness.  He is recovering.  Needs to continue with his rehabilitation.  We will attempt to optimize his COPD management.  He needs a repeat chest x-ray next visit to ensure clearance of his infiltrate.  COPD Mild obstruction based on pulmonary function testing from 2015.  He has daily symptoms and suspect that his obstructive disease is actually more severe.  We will get him on a better schedule bronchodilator therapy.  He is not a frequent exacerbator so I will use Stiolto, see how he benefits.  Stop his other inhalers which she was only using intermittently anyway.  He can continue to keep albuterol and DuoNeb available to use as needed.  I like to repeat his pulmonary function testing once he is at baseline.  Baltazar Apo, MD, PhD 06/10/2018, 5:12 PM [facility] [phone] or if no answer [phone]

## 2018-06-10 NOTE — Assessment & Plan Note (Signed)
Mild obstruction based on pulmonary function testing from 2015.  He has daily symptoms and suspect that his obstructive disease is actually more severe.  We will get him on a better schedule bronchodilator therapy.  He is not a frequent exacerbator so I will use Stiolto, see how he benefits.  Stop his other inhalers which she was only using intermittently anyway.  He can continue to keep albuterol and DuoNeb available to use as needed.  I like to repeat his pulmonary function testing once he is at baseline.

## 2018-06-10 NOTE — Assessment & Plan Note (Signed)
>>  ASSESSMENT AND PLAN FOR CHRONIC OBSTRUCTIVE PULMONARY DISEASE, UNSPECIFIED ([place]) WRITTEN ON 06/10/2018  5:12 PM BY BYRUM, ROBERT S, MD  Mild obstruction based on pulmonary function testing from 2015.  He has daily symptoms and suspect that his obstructive disease is actually more severe.  We will get him on a better schedule bronchodilator therapy.  He is not a frequent exacerbator so I will use Stiolto, see how he benefits.  Stop his other inhalers which she was only using intermittently anyway.  He can continue to keep albuterol and DuoNeb available to use as needed.  I like to repeat his pulmonary function testing once he is at baseline.

## 2018-06-10 NOTE — Progress Notes (Signed)
Patient seen in the office today and instructed on use of Stiolto.  Patient expressed understanding and demonstrated technique. 

## 2018-06-10 NOTE — Assessment & Plan Note (Signed)
Not currently on CPAP, will revisit going forward

## 2018-06-10 NOTE — Patient Instructions (Signed)
Please stop your Advair and Symbicort We will start a new inhaled medication, Stiolto.  Take 2 puffs once daily every day as your maintenance medicine. Keep your albuterol (pro-air) available to use 2 puffs if needed for shortness of breath, chest tightness, wheezing. You can also keep your albuterol/ipratropium nebulizer (DuoNeb) available to use up to every 6 hours if needed for shortness of breath, chest tightness, wheezing. We will repeat your chest x-ray at your next office visit. We will perform full pulmonary function testing at your next office visit. Follow with Dr. Lamonte Sakai in 1 month with full PFT on the same day.

## 2018-06-11 DIAGNOSIS — R1310 Dysphagia, unspecified: Secondary | ICD-10-CM | POA: Diagnosis not present

## 2018-06-11 DIAGNOSIS — M47812 Spondylosis without myelopathy or radiculopathy, cervical region: Secondary | ICD-10-CM | POA: Diagnosis not present

## 2018-06-11 DIAGNOSIS — E119 Type 2 diabetes mellitus without complications: Secondary | ICD-10-CM | POA: Diagnosis not present

## 2018-06-11 DIAGNOSIS — J9611 Chronic respiratory failure with hypoxia: Secondary | ICD-10-CM | POA: Diagnosis not present

## 2018-06-11 DIAGNOSIS — M9903 Segmental and somatic dysfunction of lumbar region: Secondary | ICD-10-CM | POA: Diagnosis not present

## 2018-06-11 DIAGNOSIS — M9901 Segmental and somatic dysfunction of cervical region: Secondary | ICD-10-CM | POA: Diagnosis not present

## 2018-06-11 DIAGNOSIS — J449 Chronic obstructive pulmonary disease, unspecified: Secondary | ICD-10-CM | POA: Diagnosis not present

## 2018-06-11 DIAGNOSIS — M5442 Lumbago with sciatica, left side: Secondary | ICD-10-CM | POA: Diagnosis not present

## 2018-06-11 DIAGNOSIS — I2510 Atherosclerotic heart disease of native coronary artery without angina pectoris: Secondary | ICD-10-CM | POA: Diagnosis not present

## 2018-06-11 DIAGNOSIS — I10 Essential (primary) hypertension: Secondary | ICD-10-CM | POA: Diagnosis not present

## 2018-06-12 DIAGNOSIS — M9901 Segmental and somatic dysfunction of cervical region: Secondary | ICD-10-CM | POA: Diagnosis not present

## 2018-06-12 DIAGNOSIS — M47812 Spondylosis without myelopathy or radiculopathy, cervical region: Secondary | ICD-10-CM | POA: Diagnosis not present

## 2018-06-12 DIAGNOSIS — J9611 Chronic respiratory failure with hypoxia: Secondary | ICD-10-CM | POA: Diagnosis not present

## 2018-06-12 DIAGNOSIS — J449 Chronic obstructive pulmonary disease, unspecified: Secondary | ICD-10-CM | POA: Diagnosis not present

## 2018-06-12 DIAGNOSIS — R1310 Dysphagia, unspecified: Secondary | ICD-10-CM | POA: Diagnosis not present

## 2018-06-12 DIAGNOSIS — E119 Type 2 diabetes mellitus without complications: Secondary | ICD-10-CM | POA: Diagnosis not present

## 2018-06-12 DIAGNOSIS — M9903 Segmental and somatic dysfunction of lumbar region: Secondary | ICD-10-CM | POA: Diagnosis not present

## 2018-06-12 DIAGNOSIS — M5442 Lumbago with sciatica, left side: Secondary | ICD-10-CM | POA: Diagnosis not present

## 2018-06-12 DIAGNOSIS — I10 Essential (primary) hypertension: Secondary | ICD-10-CM | POA: Diagnosis not present

## 2018-06-12 DIAGNOSIS — I2510 Atherosclerotic heart disease of native coronary artery without angina pectoris: Secondary | ICD-10-CM | POA: Diagnosis not present

## 2018-06-13 DIAGNOSIS — Z8679 Personal history of other diseases of the circulatory system: Secondary | ICD-10-CM | POA: Diagnosis not present

## 2018-06-13 DIAGNOSIS — Z7289 Other problems related to lifestyle: Secondary | ICD-10-CM | POA: Diagnosis not present

## 2018-06-13 DIAGNOSIS — J342 Deviated nasal septum: Secondary | ICD-10-CM | POA: Diagnosis not present

## 2018-06-13 DIAGNOSIS — Z87891 Personal history of nicotine dependence: Secondary | ICD-10-CM | POA: Diagnosis not present

## 2018-06-13 DIAGNOSIS — R490 Dysphonia: Secondary | ICD-10-CM | POA: Diagnosis not present

## 2018-06-13 DIAGNOSIS — I10 Essential (primary) hypertension: Secondary | ICD-10-CM | POA: Diagnosis not present

## 2018-06-13 DIAGNOSIS — K08109 Complete loss of teeth, unspecified cause, unspecified class: Secondary | ICD-10-CM | POA: Diagnosis not present

## 2018-06-13 DIAGNOSIS — J3489 Other specified disorders of nose and nasal sinuses: Secondary | ICD-10-CM | POA: Diagnosis not present

## 2018-06-13 DIAGNOSIS — H9190 Unspecified hearing loss, unspecified ear: Secondary | ICD-10-CM | POA: Insufficient documentation

## 2018-06-13 DIAGNOSIS — E119 Type 2 diabetes mellitus without complications: Secondary | ICD-10-CM | POA: Diagnosis not present

## 2018-06-13 DIAGNOSIS — I2510 Atherosclerotic heart disease of native coronary artery without angina pectoris: Secondary | ICD-10-CM | POA: Diagnosis not present

## 2018-06-13 DIAGNOSIS — R1310 Dysphagia, unspecified: Secondary | ICD-10-CM | POA: Diagnosis not present

## 2018-06-13 DIAGNOSIS — J330 Polyp of nasal cavity: Secondary | ICD-10-CM | POA: Diagnosis not present

## 2018-06-13 DIAGNOSIS — J9611 Chronic respiratory failure with hypoxia: Secondary | ICD-10-CM | POA: Diagnosis not present

## 2018-06-13 DIAGNOSIS — J449 Chronic obstructive pulmonary disease, unspecified: Secondary | ICD-10-CM | POA: Diagnosis not present

## 2018-06-14 DIAGNOSIS — J449 Chronic obstructive pulmonary disease, unspecified: Secondary | ICD-10-CM | POA: Diagnosis not present

## 2018-06-14 DIAGNOSIS — E119 Type 2 diabetes mellitus without complications: Secondary | ICD-10-CM | POA: Diagnosis not present

## 2018-06-14 DIAGNOSIS — J9611 Chronic respiratory failure with hypoxia: Secondary | ICD-10-CM | POA: Diagnosis not present

## 2018-06-14 DIAGNOSIS — I10 Essential (primary) hypertension: Secondary | ICD-10-CM | POA: Diagnosis not present

## 2018-06-14 DIAGNOSIS — R1310 Dysphagia, unspecified: Secondary | ICD-10-CM | POA: Diagnosis not present

## 2018-06-14 DIAGNOSIS — I2510 Atherosclerotic heart disease of native coronary artery without angina pectoris: Secondary | ICD-10-CM | POA: Diagnosis not present

## 2018-06-17 DIAGNOSIS — I2510 Atherosclerotic heart disease of native coronary artery without angina pectoris: Secondary | ICD-10-CM | POA: Diagnosis not present

## 2018-06-17 DIAGNOSIS — M9901 Segmental and somatic dysfunction of cervical region: Secondary | ICD-10-CM | POA: Diagnosis not present

## 2018-06-17 DIAGNOSIS — I10 Essential (primary) hypertension: Secondary | ICD-10-CM | POA: Diagnosis not present

## 2018-06-17 DIAGNOSIS — J449 Chronic obstructive pulmonary disease, unspecified: Secondary | ICD-10-CM | POA: Diagnosis not present

## 2018-06-17 DIAGNOSIS — J9611 Chronic respiratory failure with hypoxia: Secondary | ICD-10-CM | POA: Diagnosis not present

## 2018-06-17 DIAGNOSIS — M47812 Spondylosis without myelopathy or radiculopathy, cervical region: Secondary | ICD-10-CM | POA: Diagnosis not present

## 2018-06-17 DIAGNOSIS — E119 Type 2 diabetes mellitus without complications: Secondary | ICD-10-CM | POA: Diagnosis not present

## 2018-06-17 DIAGNOSIS — M9903 Segmental and somatic dysfunction of lumbar region: Secondary | ICD-10-CM | POA: Diagnosis not present

## 2018-06-17 DIAGNOSIS — R1310 Dysphagia, unspecified: Secondary | ICD-10-CM | POA: Diagnosis not present

## 2018-06-17 DIAGNOSIS — M5442 Lumbago with sciatica, left side: Secondary | ICD-10-CM | POA: Diagnosis not present

## 2018-06-19 DIAGNOSIS — J449 Chronic obstructive pulmonary disease, unspecified: Secondary | ICD-10-CM | POA: Diagnosis not present

## 2018-06-19 DIAGNOSIS — M9903 Segmental and somatic dysfunction of lumbar region: Secondary | ICD-10-CM | POA: Diagnosis not present

## 2018-06-19 DIAGNOSIS — I10 Essential (primary) hypertension: Secondary | ICD-10-CM | POA: Diagnosis not present

## 2018-06-19 DIAGNOSIS — M5442 Lumbago with sciatica, left side: Secondary | ICD-10-CM | POA: Diagnosis not present

## 2018-06-19 DIAGNOSIS — J9611 Chronic respiratory failure with hypoxia: Secondary | ICD-10-CM | POA: Diagnosis not present

## 2018-06-19 DIAGNOSIS — R1310 Dysphagia, unspecified: Secondary | ICD-10-CM | POA: Diagnosis not present

## 2018-06-19 DIAGNOSIS — M47812 Spondylosis without myelopathy or radiculopathy, cervical region: Secondary | ICD-10-CM | POA: Diagnosis not present

## 2018-06-19 DIAGNOSIS — I2510 Atherosclerotic heart disease of native coronary artery without angina pectoris: Secondary | ICD-10-CM | POA: Diagnosis not present

## 2018-06-19 DIAGNOSIS — M9901 Segmental and somatic dysfunction of cervical region: Secondary | ICD-10-CM | POA: Diagnosis not present

## 2018-06-19 DIAGNOSIS — E119 Type 2 diabetes mellitus without complications: Secondary | ICD-10-CM | POA: Diagnosis not present

## 2018-06-24 DIAGNOSIS — M47812 Spondylosis without myelopathy or radiculopathy, cervical region: Secondary | ICD-10-CM | POA: Diagnosis not present

## 2018-06-24 DIAGNOSIS — M5442 Lumbago with sciatica, left side: Secondary | ICD-10-CM | POA: Diagnosis not present

## 2018-06-24 DIAGNOSIS — M9901 Segmental and somatic dysfunction of cervical region: Secondary | ICD-10-CM | POA: Diagnosis not present

## 2018-06-24 DIAGNOSIS — M9903 Segmental and somatic dysfunction of lumbar region: Secondary | ICD-10-CM | POA: Diagnosis not present

## 2018-06-25 DIAGNOSIS — J384 Edema of larynx: Secondary | ICD-10-CM | POA: Diagnosis not present

## 2018-06-25 DIAGNOSIS — J3489 Other specified disorders of nose and nasal sinuses: Secondary | ICD-10-CM | POA: Diagnosis not present

## 2018-06-25 DIAGNOSIS — H903 Sensorineural hearing loss, bilateral: Secondary | ICD-10-CM | POA: Diagnosis not present

## 2018-06-26 DIAGNOSIS — M9901 Segmental and somatic dysfunction of cervical region: Secondary | ICD-10-CM | POA: Diagnosis not present

## 2018-06-26 DIAGNOSIS — M9903 Segmental and somatic dysfunction of lumbar region: Secondary | ICD-10-CM | POA: Diagnosis not present

## 2018-06-26 DIAGNOSIS — M47812 Spondylosis without myelopathy or radiculopathy, cervical region: Secondary | ICD-10-CM | POA: Diagnosis not present

## 2018-06-26 DIAGNOSIS — M5442 Lumbago with sciatica, left side: Secondary | ICD-10-CM | POA: Diagnosis not present

## 2018-07-01 DIAGNOSIS — J040 Acute laryngitis: Secondary | ICD-10-CM | POA: Diagnosis not present

## 2018-07-01 DIAGNOSIS — M47812 Spondylosis without myelopathy or radiculopathy, cervical region: Secondary | ICD-10-CM | POA: Diagnosis not present

## 2018-07-01 DIAGNOSIS — I10 Essential (primary) hypertension: Secondary | ICD-10-CM | POA: Diagnosis not present

## 2018-07-01 DIAGNOSIS — E118 Type 2 diabetes mellitus with unspecified complications: Secondary | ICD-10-CM | POA: Diagnosis not present

## 2018-07-01 DIAGNOSIS — J449 Chronic obstructive pulmonary disease, unspecified: Secondary | ICD-10-CM | POA: Diagnosis not present

## 2018-07-01 DIAGNOSIS — M545 Low back pain: Secondary | ICD-10-CM | POA: Diagnosis not present

## 2018-07-01 DIAGNOSIS — M9901 Segmental and somatic dysfunction of cervical region: Secondary | ICD-10-CM | POA: Diagnosis not present

## 2018-07-01 DIAGNOSIS — M5442 Lumbago with sciatica, left side: Secondary | ICD-10-CM | POA: Diagnosis not present

## 2018-07-01 DIAGNOSIS — G629 Polyneuropathy, unspecified: Secondary | ICD-10-CM | POA: Diagnosis not present

## 2018-07-01 DIAGNOSIS — M9903 Segmental and somatic dysfunction of lumbar region: Secondary | ICD-10-CM | POA: Diagnosis not present

## 2018-07-02 DIAGNOSIS — M9901 Segmental and somatic dysfunction of cervical region: Secondary | ICD-10-CM | POA: Diagnosis not present

## 2018-07-02 DIAGNOSIS — M47812 Spondylosis without myelopathy or radiculopathy, cervical region: Secondary | ICD-10-CM | POA: Diagnosis not present

## 2018-07-02 DIAGNOSIS — M5442 Lumbago with sciatica, left side: Secondary | ICD-10-CM | POA: Diagnosis not present

## 2018-07-02 DIAGNOSIS — M9903 Segmental and somatic dysfunction of lumbar region: Secondary | ICD-10-CM | POA: Diagnosis not present

## 2018-07-03 DIAGNOSIS — M9901 Segmental and somatic dysfunction of cervical region: Secondary | ICD-10-CM | POA: Diagnosis not present

## 2018-07-03 DIAGNOSIS — M47812 Spondylosis without myelopathy or radiculopathy, cervical region: Secondary | ICD-10-CM | POA: Diagnosis not present

## 2018-07-03 DIAGNOSIS — M5442 Lumbago with sciatica, left side: Secondary | ICD-10-CM | POA: Diagnosis not present

## 2018-07-03 DIAGNOSIS — M9903 Segmental and somatic dysfunction of lumbar region: Secondary | ICD-10-CM | POA: Diagnosis not present

## 2018-07-08 DIAGNOSIS — M5442 Lumbago with sciatica, left side: Secondary | ICD-10-CM | POA: Diagnosis not present

## 2018-07-08 DIAGNOSIS — M9903 Segmental and somatic dysfunction of lumbar region: Secondary | ICD-10-CM | POA: Diagnosis not present

## 2018-07-08 DIAGNOSIS — M47812 Spondylosis without myelopathy or radiculopathy, cervical region: Secondary | ICD-10-CM | POA: Diagnosis not present

## 2018-07-08 DIAGNOSIS — M9901 Segmental and somatic dysfunction of cervical region: Secondary | ICD-10-CM | POA: Diagnosis not present

## 2018-07-09 DIAGNOSIS — M5442 Lumbago with sciatica, left side: Secondary | ICD-10-CM | POA: Diagnosis not present

## 2018-07-09 DIAGNOSIS — M9903 Segmental and somatic dysfunction of lumbar region: Secondary | ICD-10-CM | POA: Diagnosis not present

## 2018-07-09 DIAGNOSIS — M47812 Spondylosis without myelopathy or radiculopathy, cervical region: Secondary | ICD-10-CM | POA: Diagnosis not present

## 2018-07-09 DIAGNOSIS — M9901 Segmental and somatic dysfunction of cervical region: Secondary | ICD-10-CM | POA: Diagnosis not present

## 2018-07-12 ENCOUNTER — Telehealth: Payer: Self-pay | Admitting: Emergency Medicine

## 2018-07-12 MED ORDER — TIOTROPIUM BROMIDE-OLODATEROL 2.5-2.5 MCG/ACT IN AERS: 2.0000 | INHALATION_SPRAY | Freq: Every day | RESPIRATORY_TRACT | 3 refills | Status: DC

## 2018-07-12 NOTE — Telephone Encounter (Signed)
Stiolto submitted to pharmacy for refill. Anoro and Bevespi are the 2 preferred drugs.  [place] notified refill sent. Nothing further needed.

## 2018-07-15 NOTE — Telephone Encounter (Signed)
Stiolto is not covered by the patient's plan at this time The preferred alternative is Anoro Ellipta or Bevespi   RB please advise which alternate inhaler you would prefer for this patient. Thank you.

## 2018-07-16 ENCOUNTER — Telehealth: Payer: Self-pay | Admitting: Emergency Medicine

## 2018-07-16 MED ORDER — UMECLIDINIUM-VILANTEROL 62.5-25 MCG/INH IN AEPB: 1.0000 | INHALATION_SPRAY | Freq: Every day | RESPIRATORY_TRACT | 3 refills | Status: DC

## 2018-07-16 NOTE — Telephone Encounter (Signed)
ATC pt, no answer. Left message for pt to call back.  

## 2018-07-16 NOTE — Telephone Encounter (Signed)
OK to try Anoro if pt has not had trouble with powdered inhalers. If so, then go with the bevespi instead.

## 2018-07-16 NOTE — Telephone Encounter (Signed)
Called and spoke with patients daughter, she wanted to clarify the appointment time for Thursday. Patient no longer has samples of the Stiolto and he cannot afford the price of the medication. Looked back at phone note from 3/27. Anoro will be sent in. Nothing further needed.

## 2018-07-17 NOTE — Progress Notes (Deleted)
Virtual Visit via Telephone Note  I connected with Paul [name] on 07/17/18 at 10:00 AM EDT by telephone and verified that I am speaking with the correct person using two identifiers.   I discussed the limitations, risks, security and privacy concerns of performing an evaluation and management service by telephone and the availability of in person appointments. I also discussed with the patient that there may be a patient responsible charge related to this service. The patient expressed understanding and agreed to proceed.   History of Present Illness:    Observations/Objective:   Assessment and Plan:   Follow Up Instructions:    I discussed the assessment and treatment plan with the patient. The patient was provided an opportunity to ask questions and all were answered. The patient agreed with the plan and demonstrated an understanding of the instructions.   The patient was advised to call back or seek an in-person evaluation if the symptoms worsen or if the condition fails to improve as anticipated.  I provided *** minutes of non-face-to-face time during this encounter.   Martyn Ehrich, NP

## 2018-07-18 ENCOUNTER — Ambulatory Visit: Payer: Medicare Other | Admitting: Primary Care

## 2018-07-18 ENCOUNTER — Telehealth: Payer: Self-pay

## 2018-07-18 ENCOUNTER — Ambulatory Visit: Payer: Medicare Other | Admitting: Emergency Medicine

## 2018-07-18 ENCOUNTER — Encounter: Payer: Medicare Other | Admitting: Physical Medicine & Rehabilitation

## 2018-07-18 NOTE — Telephone Encounter (Signed)
Received chest xray from [facility].  Chest xray needed on 07/24/18.  Informed daughter (DPR) and she agreed.  Nothing further is needed.

## 2018-07-18 NOTE — Telephone Encounter (Signed)
Spoke to [facility]) and verified pt had chest xray at pcp office in March.  Called to get results.  Left message for Dr Shon Baton nurse.  Will push OV out until July pending Chest Xray results.

## 2018-07-25 DIAGNOSIS — G894 Chronic pain syndrome: Secondary | ICD-10-CM | POA: Insufficient documentation

## 2018-07-25 DIAGNOSIS — I10 Essential (primary) hypertension: Secondary | ICD-10-CM | POA: Diagnosis not present

## 2018-07-25 DIAGNOSIS — E118 Type 2 diabetes mellitus with unspecified complications: Secondary | ICD-10-CM | POA: Diagnosis not present

## 2018-07-25 DIAGNOSIS — R52 Pain, unspecified: Secondary | ICD-10-CM | POA: Diagnosis not present

## 2018-07-25 DIAGNOSIS — M25512 Pain in left shoulder: Secondary | ICD-10-CM | POA: Diagnosis not present

## 2018-07-25 DIAGNOSIS — M25511 Pain in right shoulder: Secondary | ICD-10-CM | POA: Diagnosis not present

## 2018-07-25 DIAGNOSIS — G629 Polyneuropathy, unspecified: Secondary | ICD-10-CM | POA: Diagnosis not present

## 2018-07-25 DIAGNOSIS — M545 Low back pain: Secondary | ICD-10-CM | POA: Diagnosis not present

## 2018-07-29 ENCOUNTER — Encounter: Payer: Self-pay | Admitting: Physical Medicine & Rehabilitation

## 2018-07-29 ENCOUNTER — Encounter: Payer: Medicare Other | Attending: Registered Nurse | Admitting: Physical Medicine & Rehabilitation

## 2018-07-29 ENCOUNTER — Other Ambulatory Visit: Payer: Self-pay

## 2018-07-29 DIAGNOSIS — Z6828 Body mass index (BMI) 28.0-28.9, adult: Secondary | ICD-10-CM | POA: Insufficient documentation

## 2018-07-29 DIAGNOSIS — G9340 Encephalopathy, unspecified: Secondary | ICD-10-CM | POA: Insufficient documentation

## 2018-07-29 DIAGNOSIS — M159 Polyosteoarthritis, unspecified: Secondary | ICD-10-CM | POA: Insufficient documentation

## 2018-07-29 DIAGNOSIS — M19012 Primary osteoarthritis, left shoulder: Secondary | ICD-10-CM

## 2018-07-29 DIAGNOSIS — M961 Postlaminectomy syndrome, not elsewhere classified: Secondary | ICD-10-CM

## 2018-07-29 DIAGNOSIS — Z955 Presence of coronary angioplasty implant and graft: Secondary | ICD-10-CM | POA: Insufficient documentation

## 2018-07-29 DIAGNOSIS — Z8249 Family history of ischemic heart disease and other diseases of the circulatory system: Secondary | ICD-10-CM | POA: Insufficient documentation

## 2018-07-29 DIAGNOSIS — J189 Pneumonia, unspecified organism: Secondary | ICD-10-CM | POA: Insufficient documentation

## 2018-07-29 DIAGNOSIS — E1142 Type 2 diabetes mellitus with diabetic polyneuropathy: Secondary | ICD-10-CM | POA: Insufficient documentation

## 2018-07-29 DIAGNOSIS — M5442 Lumbago with sciatica, left side: Secondary | ICD-10-CM

## 2018-07-29 DIAGNOSIS — E1165 Type 2 diabetes mellitus with hyperglycemia: Secondary | ICD-10-CM | POA: Insufficient documentation

## 2018-07-29 DIAGNOSIS — M25511 Pain in right shoulder: Secondary | ICD-10-CM

## 2018-07-29 DIAGNOSIS — I10 Essential (primary) hypertension: Secondary | ICD-10-CM | POA: Diagnosis not present

## 2018-07-29 DIAGNOSIS — E669 Obesity, unspecified: Secondary | ICD-10-CM | POA: Insufficient documentation

## 2018-07-29 DIAGNOSIS — M542 Cervicalgia: Secondary | ICD-10-CM

## 2018-07-29 DIAGNOSIS — R7881 Bacteremia: Secondary | ICD-10-CM | POA: Insufficient documentation

## 2018-07-29 DIAGNOSIS — J9601 Acute respiratory failure with hypoxia: Secondary | ICD-10-CM | POA: Insufficient documentation

## 2018-07-29 DIAGNOSIS — G8929 Other chronic pain: Secondary | ICD-10-CM

## 2018-07-29 DIAGNOSIS — I2510 Atherosclerotic heart disease of native coronary artery without angina pectoris: Secondary | ICD-10-CM | POA: Insufficient documentation

## 2018-07-29 DIAGNOSIS — M109 Gout, unspecified: Secondary | ICD-10-CM | POA: Insufficient documentation

## 2018-07-29 DIAGNOSIS — E785 Hyperlipidemia, unspecified: Secondary | ICD-10-CM | POA: Insufficient documentation

## 2018-07-29 DIAGNOSIS — Z87891 Personal history of nicotine dependence: Secondary | ICD-10-CM | POA: Insufficient documentation

## 2018-07-29 DIAGNOSIS — M5441 Lumbago with sciatica, right side: Secondary | ICD-10-CM

## 2018-07-29 DIAGNOSIS — Z9981 Dependence on supplemental oxygen: Secondary | ICD-10-CM | POA: Insufficient documentation

## 2018-07-29 DIAGNOSIS — M25512 Pain in left shoulder: Secondary | ICD-10-CM

## 2018-07-29 DIAGNOSIS — G4733 Obstructive sleep apnea (adult) (pediatric): Secondary | ICD-10-CM | POA: Insufficient documentation

## 2018-07-29 DIAGNOSIS — J440 Chronic obstructive pulmonary disease with acute lower respiratory infection: Secondary | ICD-10-CM | POA: Insufficient documentation

## 2018-07-29 NOTE — Progress Notes (Signed)
Subjective:    Patient ID: Paul [name], male    DOB: Jan 11, 1951, 68 y.o.   MRN: [id number]  TELEHEALTH NOTE  Due to national recommendations of social distancing due to COVID 19, an audio/video telehealth visit is felt to be most appropriate for this patient at this time.  See Chart message from today for the patient's consent to telehealth from [place].     I verified that I am speaking with the correct person using two identifiers.  Location of patient: Home Location of provider: Home Method of communication: Telephone Names of participants : Zorita Pang scheduling, Wenda Overland obtaining consent and vitals if available Established patient Time spent on call: 17 min  HPI Right-handed male with history of CAD, angioplasty presents for follow-up for encephalopathy and debility.  Last clinic visit on 06/04/2018.  He was seen by NP at that time, notes reviewed. He completed therapies since that time, he is doing HEP. He saw Pulm and meds were adjusted. He saw his PCP. He continues to require supplemental O2, but less. Cognition has returned to normal. He states he had a couple of falls tripping on things. He had his shoulder xrayed, which was negative per pt.  Denies swallowing difficulties. BP has been controlled.  CBGs have also been controlled, but has not been checking, states he feels fine.  Encouraged glucometer. Sleep is fair.  Pain Inventory Average Pain 5 Pain Right Now 5 My pain is constant and aching fell and hit right shoulder and has sciatica in left foot  In the last 24 hours, has pain interfered with the following? General activity 3 Relation with others 0 Enjoyment of life 0 What TIME of day is your pain at its worst? all Sleep (in general) Poor  Pain is worse with: some activites Pain improves with: rest and therapy/exercise Relief from Meds: 2  Mobility walk without assistance ability to climb steps?  yes do you  drive?  yes  Function retired  Neuro/Psych weakness  Prior Studies Any changes since last visit?  yes  Fell and hit shoulder(R)  saw his chiropractor and was x rayed - no injury  Physicians involved in your care Any changes since last visit?  no   Family History  Problem Relation Age of Onset  . Heart disease Sister   . Heart attack Brother   . Emphysema Father        smoked  . Aneurysm Father    Social History   Socioeconomic History  . Marital status: Divorced    Spouse name: Not on file  . Number of children: Not on file  . Years of education: Not on file  . Highest education level: Not on file  Occupational History  . Not on file  Social Needs  . Financial resource strain: Not on file  . Food insecurity:    Worry: Not on file    Inability: Not on file  . Transportation needs:    Medical: Not on file    Non-medical: Not on file  Tobacco Use  . Smoking status: Former Smoker    Packs/day: 1.00    Years: 40.00    Pack years: 40.00    Types: Cigarettes    Last attempt to quit: 06/10/2016    Years since quitting: 2.1  . Smokeless tobacco: Never Used  . Tobacco comment: 02/18/14- smokes occ cig maybe 3 x per wk  Substance and Sexual Activity  . Alcohol use: Yes  Alcohol/week: 0.0 standard drinks    Comment: social 2-3 drink a week  . Drug use: No  . Sexual activity: Not Currently  Lifestyle  . Physical activity:    Days per week: Not on file    Minutes per session: Not on file  . Stress: Not on file  Relationships  . Social connections:    Talks on phone: Not on file    Gets together: Not on file    Attends religious service: Not on file    Active member of club or organization: Not on file    Attends meetings of clubs or organizations: Not on file    Relationship status: Not on file  Other Topics Concern  . Not on file  Social History Narrative   He is a 68 y.o. divorced father of 74, grandfather 1.   He is a retired Radiation protection practitioner, former  Museum/gallery conservator. 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   He quit smoking in August 2013, after smoking a pack a day for roughly 40 years.   He drinks socially 2-3 drinks a week only.   He does not get routine exercise, mostly due to 2 fatigue and dyspnea. Otherwise been relatively sedentary.   Past Surgical History:  Procedure Laterality Date  . CERVICAL SPINE SURGERY  2012  . CORONARY ANGIOPLASTY WITH STENT PLACEMENT  11/23/2011   "1; first one"  . LEFT HEART CATHETERIZATION WITH CORONARY ANGIOGRAM N/A 11/23/2011   Procedure: LEFT HEART CATHETERIZATION WITH CORONARY ANGIOGRAM;  Surgeon: Leonie Man, MD;  Location: [facility] CATH LAB;  Service: Cardiovascular;  Laterality: N/A;   Past Medical History:  Diagnosis Date  . CAD S/P percutaneous coronary angioplasty -- D1, Xience Xpedition DES 2.5 mm x 33 mm 11/24/2011   a) Mild-to-moderate 30-40% lesions in the RCA, LAD and Circumflex. b) CULPRIT LESION: long tubular 70-80% lesion in D1 with FFR of 0.7 --> PCI w/ Xience Xpedition DES 2.5 mm x 30 mm (2.65 MM); c) Lexiscan Myoview 11/2013: No Ischemia or Infarct (Inferior Gut Attenuation) EF 63%.  Marland Kitchen COPD (chronic obstructive pulmonary disease) ([place])    "don't have full case of it; I'm right there at it"  . Essential hypertension 11/24/2011  . Exertional dyspnea, chronic   . Gout   . History of Unstable angina 11/24/2011   Referred for cardiac catheterization  . Hyperlipidemia with target LDL less than 70 11/24/2011  . Obesity (BMI 30.0-34.9)   . OSA (obstructive sleep apnea), uses oxygen at home did not tolerate cpap 11/24/2011   There were no vitals taken for this visit.  Opioid Risk Score:   Fall Risk Score:  `1  Depression screen PHQ 2/9  Depression screen PHQ 2/9 07/29/2018  Decreased Interest 0  Down, Depressed, Hopeless 0  PHQ - 2 Score 0    Review of Systems  Constitutional: Negative.   HENT: Negative.   Eyes:  Negative.   Respiratory: Negative.   Cardiovascular: Negative.   Gastrointestinal: Negative.   Endocrine: Negative.   Genitourinary: Negative.   Musculoskeletal: Positive for back pain and myalgias.       Sciatica into his left foot  Skin: Negative.   Allergic/Immunologic: Negative.   Neurological: Positive for weakness and numbness.       Tingling in left foot  Hematological: Bruises/bleeds easily.       Plavix  Psychiatric/Behavioral: Negative.   All other systems reviewed and are negative.  Objective:   Physical Exam Gen: NAD. Pulm: Effort normal Neuro: Alert and oriented    Assessment & Plan:  Right-handed male with history of CAD, angioplasty presents for follow-up for encephalopathy and debility.  1. Encephalopathy/debilitysecondary to Acute hypoxic respiratory failure/RUL pneumonia complicated by history of COPD oxygen dependent.   Improving  2. Pain Management:  Relatively controlled  3. Dysphagia.    Improving per patient  4. Hypertension.   Cont meds  5. Diabetes mellitus with peripheral neuropathy.   Follow up with PCP  Encouraged testing

## 2018-07-31 ENCOUNTER — Telehealth: Payer: Self-pay | Admitting: Emergency Medicine

## 2018-07-31 MED ORDER — UMECLIDINIUM-VILANTEROL 62.5-25 MCG/INH IN AEPB: 1.0000 | INHALATION_SPRAY | Freq: Every day | RESPIRATORY_TRACT | 5 refills | Status: DC

## 2018-07-31 NOTE — Telephone Encounter (Signed)
Rx anoro sent to pt's preferred pharmacy. Called and spoke with pt's daughter Claiborne Billings letting her know this had been taken care of. Claiborne Billings expressed understanding. Nothing further needed.

## 2018-08-01 ENCOUNTER — Telehealth (INDEPENDENT_AMBULATORY_CARE_PROVIDER_SITE_OTHER): Payer: Self-pay | Admitting: *Deleted

## 2018-08-01 ENCOUNTER — Telehealth: Payer: Self-pay

## 2018-08-01 ENCOUNTER — Other Ambulatory Visit: Payer: Self-pay

## 2018-08-01 MED ORDER — UMECLIDINIUM-VILANTEROL 62.5-25 MCG/INH IN AEPB: 1.0000 | INHALATION_SPRAY | Freq: Every day | RESPIRATORY_TRACT | 0 refills | Status: DC

## 2018-08-01 NOTE — Telephone Encounter (Signed)
Called pt's pharmacy and spoke with Juliann Pulse in regards to pt's Anoro. Per Juliann Pulse, the last time Rx was filled for pt's Anoro was 07/16/2018 and due to this, it is too soon for pt to be able to pick med up as insurance will not cover it. Juliann Pulse stated if pt wanted to go ahead and pick inhaler up before then, pt would have to pay $538.  Juliann Pulse stated to me that pt could pick inhaler up the 22nd of the month and receive coverage by insurance.  Called and spoke with pt's daughter Claiborne Billings in regards to the info I found out from pharmacy. Stated to her that it would be the 22nd when they will be able to pick up the Anoro from pharmacy and receive insurance coverage. Claiborne Billings expressed understanding but stated what should pt do in the mean time since he is completely out of the anoro. Per Claiborne Billings, when pt was previously on the Stiolto the instructions for the Stiolto was for pt to do 2 puffs daily and due to the [employer] not being covered by insurance, pt was then switched to the Anoro. Claiborne Billings stated that pt thought the instructions for the Anoro were the same as the [place] for pt to do 2 puffs daily so that is what pt had been doing with the Anoro and I stated to her that the Anoro is just one puff daily, and this is why pt ran out of the Anoro too quick.  Claiborne Billings is wanting to know what pt could do in the meantime since they are not able to pick up the Anoro until the 22nd from the pharmacy. Her concerns is prior to pt's last OV with RB in February 22, pt was in the hospital with PNA for 6 weeks and she is wanting to do all she can to keep him out of the hospital.  Claiborne Billings stated that pt does have a rescue inhaler as well as duoneb for the nebulizer that pt has in case of an emergency.  Tammy, please advise on this for pt recs that pt can do until they are able to pick up Anoro Rx on 4/22. Thanks!

## 2018-08-01 NOTE — Telephone Encounter (Signed)
I have obtained the sample and logged into our samples log.

## 2018-08-01 NOTE — Telephone Encounter (Signed)
Sorry to hear this , please let him know the correct dosing instructions, looked up the prescription and it is correct  Please leave [facility] sample of front in paper bag with patient name on .  Please tell daughter she will have to get exact time to come pick up and will have to get out front in breeze way. If she has mask please wear.   This sample is given to patient in order to avoid hospitalization in high risk patient  (he was admitted in January with critical illness )    He will need to get prescription filled on 4/22 as pharmacy indicated is the date he can refill .

## 2018-08-01 NOTE — Telephone Encounter (Signed)
Spoke with [place] who is working with TP today who stated that she has already gotten samples of Anoro for pt and will place them up front. Due to [place] unable to call pt's daughter yet to let her know this, I stated to [place] that I would call her to let her know.  Attempted to call pt's daughter Claiborne Billings to let her know that we are giving pt samples of the Anoro but unable to reach her. Left a detailed message on her phone with this info and also stated in the message for her to call us tomorrow to let us know when she could come by the office to pick up samples for pt.   Since I was not able to speak to [facility] and had to leave a message, called and spoke with pt letting him know this info as well and stated to pt that we were trying to do all we could to keep him out of the hospital. Pt expressed understanding and greatly appreciated what we were going to do for him. Stated to pt if he talked with Claiborne Billings before she could call us tomorrow to let her know this info as well and pt stated he would.    Will leave encounter open and await a return call from pt's daughter Claiborne Billings with what time she is going to be able to come by office to pick up samples for pt.

## 2018-08-02 ENCOUNTER — Telehealth (INDEPENDENT_AMBULATORY_CARE_PROVIDER_SITE_OTHER): Payer: Self-pay | Admitting: *Deleted

## 2018-08-02 DIAGNOSIS — E118 Type 2 diabetes mellitus with unspecified complications: Secondary | ICD-10-CM | POA: Diagnosis not present

## 2018-08-02 DIAGNOSIS — I10 Essential (primary) hypertension: Secondary | ICD-10-CM | POA: Diagnosis not present

## 2018-08-02 DIAGNOSIS — E1151 Type 2 diabetes mellitus with diabetic peripheral angiopathy without gangrene: Secondary | ICD-10-CM | POA: Diagnosis not present

## 2018-08-02 DIAGNOSIS — E786 Lipoprotein deficiency: Secondary | ICD-10-CM | POA: Diagnosis not present

## 2018-08-02 DIAGNOSIS — M109 Gout, unspecified: Secondary | ICD-10-CM | POA: Diagnosis not present

## 2018-08-02 DIAGNOSIS — Z125 Encounter for screening for malignant neoplasm of prostate: Secondary | ICD-10-CM | POA: Diagnosis not present

## 2018-08-02 NOTE — Telephone Encounter (Signed)
Pt is scheduled for 09/06/2018

## 2018-08-02 NOTE — Telephone Encounter (Signed)
Yes ok 

## 2018-08-02 NOTE — Telephone Encounter (Signed)
Patient's daughter Paul [name] returned call Advised TP authorized Anoro samples [name] has already obtained samples and placed them in the sample box Paul [name] will come to pick these up at lunchtime today - is aware she will need to bring her ID  Nothing further needed; will sign off.

## 2018-08-05 DIAGNOSIS — R82998 Other abnormal findings in urine: Secondary | ICD-10-CM | POA: Diagnosis not present

## 2018-08-05 NOTE — Telephone Encounter (Signed)
Yes ok, when we start

## 2018-08-09 DIAGNOSIS — M109 Gout, unspecified: Secondary | ICD-10-CM | POA: Diagnosis not present

## 2018-08-09 DIAGNOSIS — Z1331 Encounter for screening for depression: Secondary | ICD-10-CM | POA: Diagnosis not present

## 2018-08-09 DIAGNOSIS — M25511 Pain in right shoulder: Secondary | ICD-10-CM | POA: Diagnosis not present

## 2018-08-09 DIAGNOSIS — E1151 Type 2 diabetes mellitus with diabetic peripheral angiopathy without gangrene: Secondary | ICD-10-CM | POA: Diagnosis not present

## 2018-08-09 DIAGNOSIS — Z9861 Coronary angioplasty status: Secondary | ICD-10-CM | POA: Diagnosis not present

## 2018-08-09 DIAGNOSIS — E786 Lipoprotein deficiency: Secondary | ICD-10-CM | POA: Insufficient documentation

## 2018-08-09 DIAGNOSIS — G9340 Encephalopathy, unspecified: Secondary | ICD-10-CM | POA: Diagnosis not present

## 2018-08-09 DIAGNOSIS — Z0000 Encounter for general adult medical examination without abnormal findings: Secondary | ICD-10-CM | POA: Diagnosis not present

## 2018-08-09 DIAGNOSIS — I739 Peripheral vascular disease, unspecified: Secondary | ICD-10-CM | POA: Diagnosis not present

## 2018-08-09 DIAGNOSIS — Z1339 Encounter for screening examination for other mental health and behavioral disorders: Secondary | ICD-10-CM | POA: Diagnosis not present

## 2018-08-09 DIAGNOSIS — J449 Chronic obstructive pulmonary disease, unspecified: Secondary | ICD-10-CM | POA: Diagnosis not present

## 2018-08-09 NOTE — Telephone Encounter (Signed)
Patient is already scheduled for bilateral shoulder injections on 5/22.

## 2018-09-06 ENCOUNTER — Ambulatory Visit: Payer: Self-pay

## 2018-09-06 ENCOUNTER — Ambulatory Visit (INDEPENDENT_AMBULATORY_CARE_PROVIDER_SITE_OTHER): Payer: Medicare Other | Admitting: Physical Medicine and Rehabilitation

## 2018-09-06 ENCOUNTER — Other Ambulatory Visit: Payer: Self-pay

## 2018-09-06 DIAGNOSIS — M25511 Pain in right shoulder: Secondary | ICD-10-CM | POA: Diagnosis not present

## 2018-09-06 DIAGNOSIS — G8929 Other chronic pain: Secondary | ICD-10-CM

## 2018-09-06 DIAGNOSIS — M25512 Pain in left shoulder: Secondary | ICD-10-CM | POA: Diagnosis not present

## 2018-09-06 NOTE — Progress Notes (Signed)
Numeric Pain Rating Scale and Functional Assessment Average Pain ----5 Pain Right Now ----5 My pain is aches in shoulder Pain is worse with: any and everything Pain improves with: laying in the bed with a throw pilliow between arms, takes tylenol arthritis for the pain, does seem to help but comes back.  In the last MONTH (on 0-10 scale) has pain interfered with the following?  1. General activity like being  able to carry out your everyday physical activities such as walking, climbing stairs, carrying groceries, or moving a chair?  Rating(5)  2. Relation with others like being able to carry out your usual social activities and roles such as  activities at home, at work and in your community. Rating(5)  3. Enjoyment of life such that you have  been bothered by emotional problems such as feeling anxious, depressed or irritable?  Rating(5)

## 2018-09-06 NOTE — Progress Notes (Signed)
Paul [name] - 68 y.o. male MRN [id number]  Date of birth: Sep 22, 1950  Office Visit Note: Visit Date: 09/06/2018 PCP: Leanna Battles, MD Referred by: Leanna Battles, MD  Subjective: Chief Complaint  Patient presents with  . Right Shoulder - Pain  . Left Shoulder - Pain   HPI: Paul [name] is a 68 y.o. male who comes in today Comes in today for planned bilateral intra-articular glenohumeral joint injections with fluoroscopic guidance.  Last time I saw the patient was in December and completed injection then it was very beneficial.  His left shoulder is significant end-stage osteoarthritis and right shoulder is still osteoarthritic but not nearly as bad as the left at least from an imaging standpoint.  Symptoms tend to be similar at times.  His history unfortunately is very complicated with type 2 diabetes with poor blood sugar control and blood sugar lability.  He has pulmonary and cardiac issues as well.  He has had no new trauma no radicular pain.  History of a diagnosis of polyneuropathy.  ROS Otherwise per HPI.  Assessment & Plan: Visit Diagnoses:  1. Chronic pain of both shoulders     Plan: No additional findings.   Meds & Orders: No orders of the defined types were placed in this encounter.   Orders Placed This Encounter  Procedures  . Large Joint Inj: bilateral glenohumeral  . XR C-ARM NO REPORT    Follow-up: Return if symptoms worsen or fail to improve.   Procedures: Large Joint Inj: bilateral glenohumeral on 09/06/2018 9:15 AM Indications: pain and diagnostic evaluation Details: 22 G 3.5 in needle, fluoroscopy-guided anteromedial approach  Arthrogram: No  Medications (Right): 4 mL bupivacaine 0.25 %; 40 mg triamcinolone acetonide 40 MG/ML Medications (Left): 4 mL bupivacaine 0.25 %; 40 mg triamcinolone acetonide 40 MG/ML Outcome: tolerated well, no immediate complications  There was excellent flow of contrast producing a partial arthrogram of the glenohumeral  joint. The patient did have relief of symptoms during the anesthetic phase of the injection. Procedure, treatment alternatives, risks and benefits explained, specific risks discussed. Consent was given by the patient. Immediately prior to procedure a time out was called to verify the correct patient, procedure, equipment, support staff and site/side marked as required. Patient was prepped and draped in the usual sterile fashion.      No notes on file   Clinical History: MRI CERVICAL SPINE WITHOUT CONTRAST  TECHNIQUE: Multiplanar, multisequence MR imaging of the cervical spine was performed. No intravenous contrast was administered.  COMPARISON:  Cervical spine radiograph 07/24/2012  Cervical spine MRI 07/24/2012  FINDINGS: Alignment: Normal  Vertebrae: C4-C6 ACDF.  No acute abnormality.  Cord: Normal signal and caliber.  Posterior Fossa, vertebral arteries, paraspinal tissues: Negative.  Disc levels:  C1-C2: Normal.  C2-C3: Normal disc space and facets. No spinal canal or neuroforaminal stenosis.  C3-C4: Bilateral uncovertebral hypertrophy with moderate neural foraminal stenosis. Mild central spinal canal stenosis. This level is unchanged.  C4-C5: Postsurgical changes with wide patency of the thecal sac. No foraminal stenosis.  C5-C6: Postsurgical changes with widely patent spinal canal. Mild narrowing of the left neural foramen.  C6-C7: Small central disc protrusion narrows the ventral thecal sac. Mild spinal canal stenosis. No neural foraminal stenosis.  C7-T1: Normal disc space and facets. No spinal canal or neuroforaminal stenosis.  IMPRESSION: 1. Moderate bilateral neural foraminal stenosis and mild spinal canal stenosis at C3-C4, unchanged. 2. C4-C6 ACDF with widely patent spinal canal.   Electronically Signed   By: Ulyses Jarred  M.D.   On: 11/09/2016 21:46   He reports that he quit smoking about 2 years ago. His smoking use included  cigarettes. He has a 40.00 pack-year smoking history. He has never used smokeless tobacco.  Recent Labs    05/02/18 0737  HGBA1C 11.8*    Objective:  VS:  HT:    WT:   BMI:     BP:   HR: bpm  TEMP: ( )  RESP:  Physical Exam  Ortho Exam Imaging: No results found.  Past Medical/Family/Surgical/Social History: Medications & Allergies reviewed per EMR, new medications updated. Patient Active Problem List   Diagnosis Date Noted  . Primary osteoarthritis, left shoulder 07/29/2018  . Prolonged QT interval   . Labile blood glucose   . Type 2 diabetes mellitus with peripheral neuropathy ([facility])   . Labile blood pressure   . Sleep disturbance   . Hypoalbuminemia due to protein-calorie malnutrition ([place])   . Poorly controlled type 2 diabetes mellitus with peripheral neuropathy ([place])   . Dysphagia   . Candidiasis   . Acute encephalopathy 05/03/2018  . Pressure injury of skin 04/30/2018  . Coronary artery disease involving native coronary artery of native heart without angina pectoris   . Tobacco abuse   . Hypertension   . Acute blood loss anemia   . Bacteremia   . Respiratory failure ([place])   . CAP (community acquired pneumonia) 04/21/2018  . Corneal irritation of both eyes 10/31/2017  . Allergic rhinitis due to animal hair and dander 10/31/2017  . OSA on CPAP 10/10/2016  . OSA and COPD overlap syndrome ([place]) 03/02/2016  . Bronchitis, chronic obstructive, with exacerbation ([place]) 03/02/2016  . Hypoxemia 03/02/2016  . Dependence on supplemental oxygen 03/02/2016  . CAD, multiple vessel 03/02/2016  . Gastroesophageal reflux disease 03/02/2016  . HTN (hypertension), malignant 03/02/2016  . Cigarette smoker 02/22/2014  . DOE (dyspnea on exertion) 01/20/2014  . Obesity (BMI 30.0-34.9)   . Essential hypertension 11/24/2011  . Hyperlipidemia with target LDL less than 70 11/24/2011  . OSA (obstructive sleep apnea), uses oxygen at home did not tolerate cpap 11/24/2011  . COPD  11/24/2011  . CAD S/P percutaneous coronary angioplasty -- D1, Xience Xpedition DES 2.5 mm x 33 mm 11/24/2011   Past Medical History:  Diagnosis Date  . CAD S/P percutaneous coronary angioplasty -- D1, Xience Xpedition DES 2.5 mm x 33 mm 11/24/2011   a) Mild-to-moderate 30-40% lesions in the RCA, LAD and Circumflex. b) CULPRIT LESION: long tubular 70-80% lesion in D1 with FFR of 0.7 --> PCI w/ Xience Xpedition DES 2.5 mm x 30 mm (2.65 MM); c) Lexiscan Myoview 11/2013: No Ischemia or Infarct (Inferior Gut Attenuation) EF 63%.  Marland Kitchen COPD (chronic obstructive pulmonary disease) ([place])    "don't have full case of it; I'm right there at it"  . Essential hypertension 11/24/2011  . Exertional dyspnea, chronic   . Gout   . History of Unstable angina 11/24/2011   Referred for cardiac catheterization  . Hyperlipidemia with target LDL less than 70 11/24/2011  . Obesity (BMI 30.0-34.9)   . OSA (obstructive sleep apnea), uses oxygen at home did not tolerate cpap 11/24/2011   Family History  Problem Relation Age of Onset  . Heart disease Sister   . Heart attack Brother   . Emphysema Father        smoked  . Aneurysm Father    Past Surgical History:  Procedure Laterality Date  . CERVICAL SPINE SURGERY  2012  .  CORONARY ANGIOPLASTY WITH STENT PLACEMENT  11/23/2011   "1; first one"  . LEFT HEART CATHETERIZATION WITH CORONARY ANGIOGRAM N/A 11/23/2011   Procedure: LEFT HEART CATHETERIZATION WITH CORONARY ANGIOGRAM;  Surgeon: Leonie Man, MD;  Location: [facility] CATH LAB;  Service: Cardiovascular;  Laterality: N/A;   Social History   Occupational History  . Not on file  Tobacco Use  . Smoking status: Former Smoker    Packs/day: 1.00    Years: 40.00    Pack years: 40.00    Types: Cigarettes    Last attempt to quit: 06/10/2016    Years since quitting: 2.2  . Smokeless tobacco: Never Used  . Tobacco comment: 02/18/14- smokes occ cig maybe 3 x per wk  Substance and Sexual Activity  . Alcohol use: Yes     Alcohol/week: 0.0 standard drinks    Comment: social 2-3 drink a week  . Drug use: No  . Sexual activity: Not Currently

## 2018-09-10 MED ORDER — TRIAMCINOLONE ACETONIDE 40 MG/ML IJ SUSP
40.0000 mg | INTRAMUSCULAR | Status: AC | PRN
  Administered 2018-09-06: 40 mg via INTRA_ARTICULAR

## 2018-09-10 MED ORDER — BUPIVACAINE HCL 0.25 % IJ SOLN
4.0000 mL | INTRAMUSCULAR | Status: AC | PRN
  Administered 2018-09-06: 4 mL via INTRA_ARTICULAR

## 2018-09-12 ENCOUNTER — Other Ambulatory Visit: Payer: Self-pay | Admitting: Emergency Medicine

## 2018-09-15 ENCOUNTER — Telehealth: Payer: Self-pay | Admitting: Cardiology

## 2018-09-15 NOTE — Telephone Encounter (Signed)
Patient shared that he has been followed by Dr. Lamonte Sakai post-hospital since February 2020 for respiratory illness.  The patient is scheduled for hospital Covid-19 testing tomorrow.  Does not need [facility] testing.

## 2018-09-16 ENCOUNTER — Other Ambulatory Visit (HOSPITAL_COMMUNITY)
Admission: RE | Admit: 2018-09-16 | Discharge: 2018-09-16 | Disposition: A | Discharge status: Home / Self Care | Payer: Medicare Other | Source: Ambulatory Visit | Attending: Emergency Medicine | Admitting: Emergency Medicine

## 2018-09-16 DIAGNOSIS — Z1159 Encounter for screening for other viral diseases: Secondary | ICD-10-CM | POA: Insufficient documentation

## 2018-09-18 LAB — NOVEL CORONAVIRUS, NAA (HOSP ORDER, SEND-OUT TO REF LAB; TAT 18-24 HRS): SARS-CoV-2, NAA: NOT DETECTED

## 2018-09-20 ENCOUNTER — Ambulatory Visit (INDEPENDENT_AMBULATORY_CARE_PROVIDER_SITE_OTHER): Payer: Medicare Other

## 2018-09-20 ENCOUNTER — Ambulatory Visit (INDEPENDENT_AMBULATORY_CARE_PROVIDER_SITE_OTHER): Payer: Medicare Other | Admitting: Emergency Medicine

## 2018-09-20 ENCOUNTER — Other Ambulatory Visit: Payer: Self-pay

## 2018-09-20 DIAGNOSIS — Z8709 Personal history of other diseases of the respiratory system: Secondary | ICD-10-CM | POA: Diagnosis not present

## 2018-09-20 DIAGNOSIS — J449 Chronic obstructive pulmonary disease, unspecified: Secondary | ICD-10-CM | POA: Diagnosis not present

## 2018-09-20 LAB — PULMONARY FUNCTION TEST
DL/VA % pred: 83 %
DL/VA: 3.46 ml/min/mmHg/L
DLCO unc % pred: 84 %
DLCO unc: 18.77 ml/min/mmHg
FEF 25-75 Post: 1.77 L/sec
FEF 25-75 Pre: 1.5 L/sec
FEF2575-%Change-Post: 17 %
FEF2575-%Pred-Post: 85 %
FEF2575-%Pred-Pre: 72 %
FEV1-%Change-Post: 3 %
FEV1-%Pred-Post: 95 %
FEV1-%Pred-Pre: 92 %
FEV1-Post: 2.55 L
FEV1-Pre: 2.46 L
FEV1FVC-%Change-Post: 0 %
FEV1FVC-%Pred-Pre: 94 %
FEV6-%Change-Post: 3 %
FEV6-%Pred-Post: 103 %
FEV6-%Pred-Pre: 100 %
FEV6-Post: 3.55 L
FEV6-Pre: 3.44 L
FEV6FVC-%Change-Post: 0 %
FEV6FVC-%Pred-Post: 103 %
FEV6FVC-%Pred-Pre: 104 %
FVC-%Change-Post: 3 %
FVC-%Pred-Post: 100 %
FVC-%Pred-Pre: 96 %
FVC-Post: 3.66 L
FVC-Pre: 3.53 L
Post FEV1/FVC ratio: 70 %
Post FEV6/FVC ratio: 97 %
Pre FEV1/FVC ratio: 70 %
Pre FEV6/FVC Ratio: 98 %
RV % pred: 100 %
RV: 2.16 L
TLC % pred: 97 %
TLC: 5.89 L

## 2018-09-20 MED ORDER — UMECLIDINIUM-VILANTEROL 62.5-25 MCG/INH IN AEPB: 1.0000 | INHALATION_SPRAY | Freq: Every day | RESPIRATORY_TRACT | 0 refills | Status: DC

## 2018-09-20 NOTE — Progress Notes (Signed)
PFT done today. 

## 2018-11-19 DIAGNOSIS — I739 Peripheral vascular disease, unspecified: Secondary | ICD-10-CM | POA: Diagnosis not present

## 2018-11-19 DIAGNOSIS — G4733 Obstructive sleep apnea (adult) (pediatric): Secondary | ICD-10-CM | POA: Diagnosis not present

## 2018-11-19 DIAGNOSIS — Z9861 Coronary angioplasty status: Secondary | ICD-10-CM | POA: Diagnosis not present

## 2018-11-19 DIAGNOSIS — E1151 Type 2 diabetes mellitus with diabetic peripheral angiopathy without gangrene: Secondary | ICD-10-CM | POA: Diagnosis not present

## 2018-11-20 DIAGNOSIS — E118 Type 2 diabetes mellitus with unspecified complications: Secondary | ICD-10-CM | POA: Diagnosis not present

## 2018-11-25 ENCOUNTER — Telehealth: Payer: Self-pay | Admitting: Emergency Medicine

## 2018-11-25 NOTE — Telephone Encounter (Signed)
Checked pt's med list to see if pt has tried/failed any other inhalers prior to being put on the Anoro to see if a PA could possibly be performed.  Pt has been on Symbicort 160, Advair Diskus, Spiriva, and Stiolto.  Called pt's pharmacy and spoke with Ronalee Belts to see if there were any alternatives for pt's Anoro or if a PA could be attempted. Per Ronalee Belts, it was coming back showing that pt's Anoro had a $420 copay, there was no alternative listed, and it also did not state that a PA could be performed.  Attempted to call pt's daughter Claiborne Billings in regards to this but unable to reach.left detailed message for Claiborne Billings letting her know that we were going to check with RB to see what he recommended.  Dr. Lamonte Sakai, please advise on this what you recommend [place] doing and also if you want to see if our pharmacy staff could take a look at this. Thanks!

## 2018-11-26 NOTE — Telephone Encounter (Signed)
Yes I believe it would be wise to have the PharmD look at this, see if we have alternative options, any assistance or PA we can provide. Thanks.

## 2018-11-26 NOTE — Telephone Encounter (Signed)
Amber - could you guys help with this? Thanks!

## 2018-11-28 MED ORDER — ANORO ELLIPTA 62.5-25 MCG/INH IN AEPB: 1.0000 | INHALATION_SPRAY | Freq: Every day | RESPIRATORY_TRACT | 3 refills | Status: DC

## 2018-11-28 MED ORDER — ANORO ELLIPTA 62.5-25 MCG/INH IN AEPB: 1.0000 | INHALATION_SPRAY | Freq: Every day | RESPIRATORY_TRACT | 0 refills | Status: DC

## 2018-11-28 NOTE — Telephone Encounter (Signed)
It appears he is in the donut hole and all inhalers would costly. Best option would be to apply for patient assistance as there are no grants for COPD. He may be eligible for [place] patient assistance if he has spent $600 out-of-pocket on prescription medications. My suggestion would be for clinic staff to start the process for patient assistance as the pharmacy team does not have the capacity to handle PA forms at this time.  If the clinic runs into any issues please don't hesitate to reach out for assistance.  Mariella Saa, PharmD, [place], [place] [facility] Pharmacist [phone]  11/28/2018 9:35 AM

## 2018-11-28 NOTE — Addendum Note (Signed)
Addended by: Lorretta Harp on: 11/28/2018 10:54 AM   Modules accepted: Orders

## 2018-11-28 NOTE — Telephone Encounter (Signed)
Called and spoke with pt's daughter Paul [name] explaining to her the info stated by [employer]. Stated to her that she could contact insurance company to get further explanation on what it means for pt to be in the donut hole and she verbalized understanding. Also stated to her that I was going to put patient assistance application up front for her to come pick up to fill out as pt may be eligible for assistance through [place]. While speaking to [place], she asked if there was any way pt could have some samples of Anoro due to being out and I told her that I would place some samples in bag with application and she verbalized understanding. Paul [name] is going to come pick all up after lunch. Nothing further needed.

## 2018-12-03 ENCOUNTER — Other Ambulatory Visit: Payer: Self-pay | Admitting: Neurology

## 2018-12-03 DIAGNOSIS — G4733 Obstructive sleep apnea (adult) (pediatric): Secondary | ICD-10-CM

## 2018-12-26 DIAGNOSIS — G629 Polyneuropathy, unspecified: Secondary | ICD-10-CM | POA: Diagnosis not present

## 2018-12-26 DIAGNOSIS — E118 Type 2 diabetes mellitus with unspecified complications: Secondary | ICD-10-CM | POA: Diagnosis not present

## 2018-12-26 DIAGNOSIS — R5383 Other fatigue: Secondary | ICD-10-CM | POA: Diagnosis not present

## 2018-12-26 DIAGNOSIS — E559 Vitamin D deficiency, unspecified: Secondary | ICD-10-CM | POA: Diagnosis not present

## 2018-12-26 DIAGNOSIS — K219 Gastro-esophageal reflux disease without esophagitis: Secondary | ICD-10-CM | POA: Diagnosis not present

## 2018-12-26 DIAGNOSIS — G4733 Obstructive sleep apnea (adult) (pediatric): Secondary | ICD-10-CM | POA: Diagnosis not present

## 2018-12-26 DIAGNOSIS — J449 Chronic obstructive pulmonary disease, unspecified: Secondary | ICD-10-CM | POA: Diagnosis not present

## 2018-12-26 DIAGNOSIS — I10 Essential (primary) hypertension: Secondary | ICD-10-CM | POA: Diagnosis not present

## 2018-12-26 DIAGNOSIS — R7989 Other specified abnormal findings of blood chemistry: Secondary | ICD-10-CM | POA: Diagnosis not present

## 2018-12-31 DIAGNOSIS — E559 Vitamin D deficiency, unspecified: Secondary | ICD-10-CM | POA: Insufficient documentation

## 2019-02-17 ENCOUNTER — Telehealth: Payer: Self-pay | Admitting: Radiology

## 2019-02-17 NOTE — Telephone Encounter (Signed)
Patient walked in to office this afternoon requesting appointment with Dr. Ernestina Patches for bilateral shoulder injections. He would like a call back to schedule as soon as possible because he is really hurting.  CB [phone]

## 2019-02-17 NOTE — Telephone Encounter (Signed)
Please see message below , pt had bil shoulders injection 08/02/2018 ok to repeat if helped same day?

## 2019-02-17 NOTE — Telephone Encounter (Signed)
Ok, usually needs valium?

## 2019-02-19 NOTE — Telephone Encounter (Signed)
Scheduled for 11/20 at 0830. Patient declined Valium.

## 2019-02-19 NOTE — Telephone Encounter (Signed)
Called pt home and lvm #1.

## 2019-03-07 ENCOUNTER — Encounter: Payer: Self-pay | Admitting: Physical Medicine and Rehabilitation

## 2019-03-07 ENCOUNTER — Other Ambulatory Visit: Payer: Self-pay

## 2019-03-07 ENCOUNTER — Ambulatory Visit: Payer: Self-pay

## 2019-03-07 ENCOUNTER — Ambulatory Visit (INDEPENDENT_AMBULATORY_CARE_PROVIDER_SITE_OTHER): Payer: Medicare Other | Admitting: Physical Medicine and Rehabilitation

## 2019-03-07 DIAGNOSIS — M25511 Pain in right shoulder: Secondary | ICD-10-CM

## 2019-03-07 DIAGNOSIS — G8929 Other chronic pain: Secondary | ICD-10-CM

## 2019-03-07 NOTE — Progress Notes (Signed)
.  Numeric Pain Rating Scale and Functional Assessment Average Pain 8   In the last MONTH (on 0-10 scale) has pain interfered with the following?  1. General activity like being  able to carry out your everyday physical activities such as walking, climbing stairs, carrying groceries, or moving a chair?  Rating(8)    -Dye Allergies.  

## 2019-03-10 DIAGNOSIS — M25512 Pain in left shoulder: Secondary | ICD-10-CM | POA: Diagnosis not present

## 2019-03-10 DIAGNOSIS — G8929 Other chronic pain: Secondary | ICD-10-CM

## 2019-03-10 DIAGNOSIS — M25511 Pain in right shoulder: Secondary | ICD-10-CM

## 2019-03-10 MED ORDER — TRIAMCINOLONE ACETONIDE 40 MG/ML IJ SUSP
60.0000 mg | INTRAMUSCULAR | Status: AC | PRN
  Administered 2019-03-10: 60 mg via INTRA_ARTICULAR

## 2019-03-10 MED ORDER — BUPIVACAINE HCL 0.25 % IJ SOLN
4.0000 mL | INTRAMUSCULAR | Status: AC | PRN
  Administered 2019-03-10: 4 mL via INTRA_ARTICULAR

## 2019-03-10 MED ORDER — BUPIVACAINE HCL 0.25 % IJ SOLN
4.0000 mL | INTRAMUSCULAR | Status: AC | PRN
  Administered 2019-03-10: 06:00:00 4 mL via INTRA_ARTICULAR

## 2019-03-10 NOTE — Progress Notes (Signed)
Paul [name] - 68 y.o. male MRN [id number]  Date of birth: [birth year]  Office Visit Note: Visit Date: 03/07/2019 PCP: Leanna Battles, MD Referred by: Leanna Battles, MD  Subjective: Chief Complaint  Patient presents with  . Right Shoulder - Pain  . Left Shoulder - Pain  . Left Arm - Pain  . Right Arm - Pain   HPI:  Paul [name] is a 68 y.o. male who comes in today For planned bilateral intra-articular glenohumeral joint injections.  The last time I saw the patient was in May and we completed bilateral injections with good relief of his shoulder pain.  He now reports again decreased range of motion tickly on the left more than right.  He has more findings on the left shoulder joint than the right shoulder joint with degenerative changes.  He has done well with injection even though he really does not like to get them they do seem to help quite a bit.  We initially saw him through Dr. Janie Morning for lumbar and cervical issues as well as his shoulders and it turns out that his shoulders have been the biggest problem but of also responded the best to injection.  His course is complicated by type 2 diabetes and extensive heart disease.  He is on Plavix.  He reports having seen a physician at [facility] probably when it was [place].  He does not remember the name of the surgeon who he was talking about potentially surgery on his shoulder.  He would do well with consultation with Dr. Anderson Malta to see if there is anything surgical that could be done given his comorbidities and shoulder itself and will try to set up an appointment.  He has had no radicular pain no paresthesias.  He has been doing well otherwise no specific injury.  Symptoms started to come back over the last month.  ROS Otherwise per HPI.  Assessment & Plan: Visit Diagnoses:  1. Chronic pain of both shoulders     Plan: No additional findings.   Meds & Orders: No orders of the defined types were placed in  this encounter.   Orders Placed This Encounter  Procedures  . C-ARM NO Order    Follow-up: No follow-ups on file.   Procedures: Large Joint Inj: bilateral glenohumeral on 03/10/2019 5:55 AM Indications: pain and diagnostic evaluation Details: 22 G 3.5 in needle, fluoroscopy-guided anteromedial approach  Arthrogram: No  Medications (Right): 60 mg triamcinolone acetonide 40 MG/ML; 4 mL bupivacaine 0.25 % Medications (Left): 60 mg triamcinolone acetonide 40 MG/ML; 4 mL bupivacaine 0.25 % Outcome: tolerated well, no immediate complications  There was excellent flow of contrast producing a partial arthrogram of the glenohumeral joint. The patient did have relief of symptoms during the anesthetic phase of the injection. Procedure, treatment alternatives, risks and benefits explained, specific risks discussed. Consent was given by the patient. Immediately prior to procedure a time out was called to verify the correct patient, procedure, equipment, support staff and site/side marked as required. Patient was prepped and draped in the usual sterile fashion.      No notes on file   Clinical History: MRI CERVICAL SPINE WITHOUT CONTRAST  TECHNIQUE: Multiplanar, multisequence MR imaging of the cervical spine was performed. No intravenous contrast was administered.  COMPARISON:  Cervical spine radiograph 07/24/2012  Cervical spine MRI 07/24/2012  FINDINGS: Alignment: Normal  Vertebrae: C4-C6 ACDF.  No acute abnormality.  Cord: Normal signal and caliber.  Posterior Fossa, vertebral arteries, paraspinal tissues:  Negative.  Disc levels:  C1-C2: Normal.  C2-C3: Normal disc space and facets. No spinal canal or neuroforaminal stenosis.  C3-C4: Bilateral uncovertebral hypertrophy with moderate neural foraminal stenosis. Mild central spinal canal stenosis. This level is unchanged.  C4-C5: Postsurgical changes with wide patency of the thecal sac. No foraminal stenosis.   C5-C6: Postsurgical changes with widely patent spinal canal. Mild narrowing of the left neural foramen.  C6-C7: Small central disc protrusion narrows the ventral thecal sac. Mild spinal canal stenosis. No neural foraminal stenosis.  C7-T1: Normal disc space and facets. No spinal canal or neuroforaminal stenosis.  IMPRESSION: 1. Moderate bilateral neural foraminal stenosis and mild spinal canal stenosis at C3-C4, unchanged. 2. C4-C6 ACDF with widely patent spinal canal.   Electronically Signed   By: Ulyses Jarred M.D.   On: 11/09/2016 21:46     Objective:  VS:  HT:    WT:   BMI:     BP:   HR: bpm  TEMP: ( )  RESP:  Physical Exam Musculoskeletal:     Comments: Decreased range of motion really in all planes on the left less so on the right.  There is impingement bilaterally.  Positive crepitus particular on the left more than right.  He has good strength distally and negative Hoffmann's test.  He has a negative Spurling's test.     Ortho Exam Imaging: No results found.

## 2019-03-11 ENCOUNTER — Emergency Department (HOSPITAL_COMMUNITY)
Admission: EM | Admit: 2019-03-11 | Discharge: 2019-03-12 | Disposition: A | Discharge status: Home / Self Care | Payer: Medicare Other | Attending: Emergency Medicine | Admitting: Emergency Medicine

## 2019-03-11 ENCOUNTER — Emergency Department (HOSPITAL_COMMUNITY): Payer: Medicare Other

## 2019-03-11 ENCOUNTER — Encounter (HOSPITAL_COMMUNITY): Payer: Self-pay | Admitting: Emergency Medicine

## 2019-03-11 DIAGNOSIS — Z87891 Personal history of nicotine dependence: Secondary | ICD-10-CM | POA: Insufficient documentation

## 2019-03-11 DIAGNOSIS — R0602 Shortness of breath: Secondary | ICD-10-CM | POA: Diagnosis not present

## 2019-03-11 DIAGNOSIS — Z79899 Other long term (current) drug therapy: Secondary | ICD-10-CM | POA: Insufficient documentation

## 2019-03-11 DIAGNOSIS — J189 Pneumonia, unspecified organism: Secondary | ICD-10-CM | POA: Diagnosis not present

## 2019-03-11 DIAGNOSIS — M25511 Pain in right shoulder: Secondary | ICD-10-CM | POA: Diagnosis not present

## 2019-03-11 DIAGNOSIS — K5900 Constipation, unspecified: Secondary | ICD-10-CM | POA: Diagnosis not present

## 2019-03-11 DIAGNOSIS — R739 Hyperglycemia, unspecified: Secondary | ICD-10-CM

## 2019-03-11 DIAGNOSIS — M25512 Pain in left shoulder: Secondary | ICD-10-CM | POA: Diagnosis not present

## 2019-03-11 DIAGNOSIS — I2510 Atherosclerotic heart disease of native coronary artery without angina pectoris: Secondary | ICD-10-CM | POA: Insufficient documentation

## 2019-03-11 DIAGNOSIS — E118 Type 2 diabetes mellitus with unspecified complications: Secondary | ICD-10-CM | POA: Diagnosis not present

## 2019-03-11 DIAGNOSIS — Z20818 Contact with and (suspected) exposure to other bacterial communicable diseases: Secondary | ICD-10-CM | POA: Diagnosis not present

## 2019-03-11 DIAGNOSIS — I10 Essential (primary) hypertension: Secondary | ICD-10-CM | POA: Insufficient documentation

## 2019-03-11 DIAGNOSIS — I959 Hypotension, unspecified: Secondary | ICD-10-CM | POA: Diagnosis not present

## 2019-03-11 DIAGNOSIS — E871 Hypo-osmolality and hyponatremia: Secondary | ICD-10-CM | POA: Diagnosis not present

## 2019-03-11 DIAGNOSIS — R5383 Other fatigue: Secondary | ICD-10-CM | POA: Diagnosis not present

## 2019-03-11 DIAGNOSIS — D72829 Elevated white blood cell count, unspecified: Secondary | ICD-10-CM | POA: Insufficient documentation

## 2019-03-11 DIAGNOSIS — Z20828 Contact with and (suspected) exposure to other viral communicable diseases: Secondary | ICD-10-CM | POA: Diagnosis not present

## 2019-03-11 DIAGNOSIS — E1165 Type 2 diabetes mellitus with hyperglycemia: Secondary | ICD-10-CM | POA: Insufficient documentation

## 2019-03-11 DIAGNOSIS — J449 Chronic obstructive pulmonary disease, unspecified: Secondary | ICD-10-CM | POA: Insufficient documentation

## 2019-03-11 DIAGNOSIS — R350 Frequency of micturition: Secondary | ICD-10-CM | POA: Diagnosis not present

## 2019-03-11 HISTORY — DX: Hyperglycemia, unspecified: R73.9

## 2019-03-11 HISTORY — DX: Hypo-osmolality and hyponatremia: E87.1

## 2019-03-11 LAB — BASIC METABOLIC PANEL
Anion gap: 13 (ref 5–15)
BUN: 40 mg/dL — ABNORMAL HIGH (ref 8–23)
CO2: 20 mmol/L — ABNORMAL LOW (ref 22–32)
Calcium: 9.3 mg/dL (ref 8.9–10.3)
Chloride: 93 mmol/L — ABNORMAL LOW (ref 98–111)
Creatinine, Ser: 1.33 mg/dL — ABNORMAL HIGH (ref 0.61–1.24)
GFR calc Af Amer: >60 mL/min (ref >60)
GFR calc non Af Amer: 54 mL/min — ABNORMAL LOW (ref >60)
Glucose, Bld: 545 mg/dL (ref 70–99)
Potassium: 4.6 mmol/L (ref 3.5–5.1)
Sodium: 126 mmol/L — ABNORMAL LOW (ref 135–145)

## 2019-03-11 LAB — URINALYSIS, ROUTINE W REFLEX MICROSCOPIC
Bilirubin Urine: NEGATIVE
Glucose, UA: >=500 mg/dL — AB
Hgb urine dipstick: NEGATIVE
Ketones, ur: 5 mg/dL — AB
Leukocytes,Ua: NEGATIVE
Nitrite: NEGATIVE
Protein, ur: NEGATIVE mg/dL
Specific Gravity, Urine: 1.026 (ref 1.005–1.030)
pH: 5 (ref 5.0–8.0)

## 2019-03-11 LAB — CBC
HCT: 48.9 % (ref 39.0–52.0)
Hemoglobin: 16.6 g/dL (ref 13.0–17.0)
MCH: 30.9 pg (ref 26.0–34.0)
MCHC: 33.9 g/dL (ref 30.0–36.0)
MCV: 90.9 fL (ref 80.0–100.0)
Platelets: 314 10*3/uL (ref 150–400)
RBC: 5.38 MIL/uL (ref 4.22–5.81)
RDW: 12 % (ref 11.5–15.5)
WBC: 15.2 10*3/uL — ABNORMAL HIGH (ref 4.0–10.5)
nRBC: 0 % (ref 0.0–0.2)

## 2019-03-11 MED ORDER — SODIUM CHLORIDE 0.9 % IV BOLUS
1000.0000 mL | Freq: Once | INTRAVENOUS | Status: AC
  Administered 2019-03-11: 23:00:00 1000 mL via INTRAVENOUS

## 2019-03-11 MED ORDER — SODIUM CHLORIDE 0.9% FLUSH: 3.0000 mL | Freq: Once | INTRAVENOUS | Status: DC

## 2019-03-11 NOTE — ED Notes (Signed)
Claiborne Billings holt [phone] daughter for an [place]

## 2019-03-11 NOTE — ED Triage Notes (Signed)
Pt arrives via [facility] from [facility] with fatigue and general malaise since Friday- pt states he had injections in his shoulder on Friday and since has felt weak and tired. Pt was since today at office and had negative rapid covid also had labs: glucose was 606 na 129 wbc:15.99 Pt has copy of complete labs. Pt denies any specific pain just states "I feel bad".

## 2019-03-11 NOTE — ED Notes (Signed)
Family phone. [place]

## 2019-03-11 NOTE — ED Provider Notes (Signed)
[facility] EMERGENCY DEPARTMENT Provider Note   CSN: [id number] Arrival date & time: 03/11/19  1728     History   Chief Complaint Chief Complaint  Patient presents with   Abnormal Lab   Fatigue    HPI Paul [name] is a 68 y.o. male.     The history is provided by the patient and medical records.     68 y.o. M with hx of CAD, COPD, HTN, gout, HLP, OSA, presenting to the ED for fatigue.  States he got steroid injections in his shoulders on Friday and since that time he has felt very fatigued and "blah", sleeping more than usual, etc.  He has still been eating/drinking.  States he made an appt with PCP today about this, had I-stat labs done which showed glucose into the 600's and his electrolytes were out of whack.  He does admit to frequent urination and feeling very thirsty.  Also has had some mild SOB, slight chronic cough.  He denies any production of mucous or hemoptysis.  He did have a negative COVID screen today at PCP office.  He was sent here for IVF and likely insulin.  He denies history of DKA in the past.  He only takes oral metformin, no insulin.  States he is not sure why his sugar was so high today, thinks it may have been the steroid injections in his shoulders.  Past Medical History:  Diagnosis Date   CAD S/P percutaneous coronary angioplasty -- D1, Xience Xpedition DES 2.5 mm x 33 mm 11/24/2011   a) Mild-to-moderate 30-40% lesions in the RCA, LAD and Circumflex. b) CULPRIT LESION: long tubular 70-80% lesion in D1 with FFR of 0.7 --> PCI w/ Xience Xpedition DES 2.5 mm x 30 mm (2.65 MM); c) Lexiscan Myoview 11/2013: No Ischemia or Infarct (Inferior Gut Attenuation) EF 63%.   COPD (chronic obstructive pulmonary disease) ([place])    "don't have full case of it; I'm right there at it"   Essential hypertension 11/24/2011   Exertional dyspnea, chronic    Gout    History of Unstable angina 11/24/2011   Referred for cardiac catheterization   Hyperlipidemia  with target LDL less than 70 11/24/2011   Obesity (BMI 30.0-34.9)    OSA (obstructive sleep apnea), uses oxygen at home did not tolerate cpap 11/24/2011    Patient Active Problem List   Diagnosis Date Noted   Primary osteoarthritis, left shoulder 07/29/2018   Prolonged QT interval    Labile blood glucose    Type 2 diabetes mellitus with peripheral neuropathy (HCC)    Labile blood pressure    Sleep disturbance    Hypoalbuminemia due to protein-calorie malnutrition ([place])    Poorly controlled type 2 diabetes mellitus with peripheral neuropathy (HCC)    Dysphagia    Candidiasis    Acute encephalopathy 05/03/2018   Pressure injury of skin 04/30/2018   Coronary artery disease involving native coronary artery of native heart without angina pectoris    Tobacco abuse    Hypertension    Acute blood loss anemia    Bacteremia    Respiratory failure ([place])    CAP (community acquired pneumonia) 04/21/2018   Corneal irritation of both eyes 10/31/2017   Allergic rhinitis due to animal hair and dander 10/31/2017   OSA on CPAP 10/10/2016   OSA and COPD overlap syndrome ([place]) 03/02/2016   Bronchitis, chronic obstructive, with exacerbation ([place]) 03/02/2016   Hypoxemia 03/02/2016   Dependence on supplemental oxygen 03/02/2016  CAD, multiple vessel 03/02/2016   Gastroesophageal reflux disease 03/02/2016   HTN (hypertension), malignant 03/02/2016   Cigarette smoker 02/22/2014   DOE (dyspnea on exertion) 01/20/2014   Obesity (BMI 30.0-34.9)    Essential hypertension 11/24/2011   Hyperlipidemia with target LDL less than 70 11/24/2011   OSA (obstructive sleep apnea), uses oxygen at home did not tolerate cpap 11/24/2011   COPD 11/24/2011   CAD S/P percutaneous coronary angioplasty -- D1, Xience Xpedition DES 2.5 mm x 33 mm 11/24/2011    Past Surgical History:  Procedure Laterality Date   CERVICAL SPINE SURGERY  2012   CORONARY ANGIOPLASTY WITH STENT  PLACEMENT  11/23/2011   "1; first one"   LEFT HEART CATHETERIZATION WITH CORONARY ANGIOGRAM N/A 11/23/2011   Procedure: LEFT HEART CATHETERIZATION WITH CORONARY ANGIOGRAM;  Surgeon: Leonie Man, MD;  Location: [facility] CATH LAB;  Service: Cardiovascular;  Laterality: N/A;        Home Medications    Prior to Admission medications   Medication Sig Start Date End Date Taking? Authorizing Provider  ALPRAZolam (XANAX) 0.25 MG tablet Take 0.5 tablets (0.125 mg total) by mouth every 6 (six) hours as needed for anxiety. 05/22/18   Angiulli, Lavon Paganini, PA-C  amLODipine (NORVASC) 5 MG tablet Take 1 tablet by mouth daily. 07/01/18   [provider]  arformoterol (BROVANA) 15 MCG/2ML NEBU Take 2 mLs (15 mcg total) by nebulization 2 (two) times daily. 05/03/18   Sheikh, Omair Latif, DO  budesonide (PULMICORT) 0.5 MG/2ML nebulizer solution Take 2 mLs (0.5 mg total) by nebulization 2 (two) times daily. 05/03/18   Raiford Noble Latif, DO  clopidogrel (PLAVIX) 75 MG tablet Take 1 tablet (75 mg total) by mouth daily. 05/22/18   Angiulli, Lavon Paganini, PA-C  colchicine 0.6 MG tablet Take 1 tablet (0.6 mg total) by mouth daily. 05/22/18   Angiulli, Lavon Paganini, PA-C  esomeprazole (NEXIUM) 20 MG capsule Take 1 capsule (20 mg total) by mouth daily at 12 noon. 05/22/18   Angiulli, Lavon Paganini, PA-C  gabapentin (NEURONTIN) 300 MG capsule Take 300 mg by mouth at bedtime.    [provider]  guaiFENesin (MUCINEX) 600 MG 12 hr tablet Take 2 tablets (1,200 mg total) by mouth 2 (two) times daily. 05/03/18   Sheikh, Omair Latif, DO  ipratropium-albuterol (DUONEB) 0.5-2.5 (3) MG/3ML SOLN Take 3 mLs by nebulization 3 (three) times daily. 05/03/18   Sheikh, Georgina Quint Latif, DO  lidocaine (LIDODERM) 5 % Place 1 patch onto the skin daily. Remove & Discard patch within 12 hours or as directed by MD 05/22/18   Angiulli, Lavon Paganini, PA-C  loratadine (CLARITIN) 10 MG tablet Take 1 tablet (10 mg total) by mouth daily. 05/22/18   Angiulli, Lavon Paganini,  PA-C  Melatonin 3 MG TABS Take 1 tablet (3 mg total) by mouth at bedtime. 05/22/18   Angiulli, Lavon Paganini, PA-C  metFORMIN (GLUCOPHAGE) 500 MG tablet Take 0.5 tablets (250 mg total) by mouth daily with breakfast. Patient taking differently: Take 500 mg by mouth daily with breakfast.  05/22/18   Angiulli, Lavon Paganini, PA-C  metoprolol tartrate (LOPRESSOR) 25 MG tablet Take 1 tablet (25 mg total) by mouth 2 (two) times daily. 05/22/18   Angiulli, Lavon Paganini, PA-C  Multiple Vitamin (MULTIVITAMIN WITH MINERALS) TABS tablet Take 1 tablet by mouth daily. 05/22/18   Angiulli, Lavon Paganini, PA-C  nitroGLYCERIN (NITROSTAT) 0.4 MG SL tablet Place 1 tablet (0.4 mg total) under the tongue every 5 (five) minutes as needed for chest pain.  Patient not taking: Reported on 04/22/2018 11/24/11 04/13/14  Isaiah Serge, NP  tiotropium (SPIRIVA HANDIHALER) 18 MCG inhalation capsule Place 18 mcg into inhaler and inhale daily.    [provider]  umeclidinium-vilanterol (ANORO ELLIPTA) 62.5-25 MCG/INH AEPB Inhale 1 puff into the lungs daily. 07/31/18   Collene Gobble, MD  umeclidinium-vilanterol (ANORO ELLIPTA) 62.5-25 MCG/INH AEPB Inhale 1 puff into the lungs daily. 11/28/18   Collene Gobble, MD  umeclidinium-vilanterol (ANORO ELLIPTA) 62.5-25 MCG/INH AEPB Inhale 1 puff into the lungs daily. 11/28/18   Collene Gobble, MD  zolpidem (AMBIEN) 10 MG tablet Take 1 tablet by mouth at bedtime as needed. 07/12/18   [provider]    Family History Family History  Problem Relation Age of Onset   Heart disease Sister    Heart attack Brother    Emphysema Father        smoked   Aneurysm Father     Social History Social History   Tobacco Use   Smoking status: Former Smoker    Packs/day: 1.00    Years: 40.00    Pack years: 40.00    Types: Cigarettes    Quit date: 06/10/2016    Years since quitting: 2.7   Smokeless tobacco: Never Used   Tobacco comment: 02/18/14- smokes occ cig maybe 3 x per wk  Substance Use  Topics   Alcohol use: Yes    Alcohol/week: 0.0 standard drinks    Comment: social 2-3 drink a week   Drug use: No     Allergies   Codeine and Duloxetine hcl   Review of Systems Review of Systems  Respiratory: Positive for shortness of breath.   Endocrine: Positive for polydipsia and polyuria.       Hyperglycemia  All other systems reviewed and are negative.    Physical Exam Updated Vital Signs BP (!) 147/93 (BP Location: Right Arm)    Pulse 93    Temp 97.9 F (36.6 C) (Oral)    Resp 16    SpO2 97%   Physical Exam Vitals signs and nursing note reviewed.  Constitutional:      Appearance: He is well-developed.  HENT:     Head: Normocephalic and atraumatic.  Eyes:     Conjunctiva/sclera: Conjunctivae normal.     Pupils: Pupils are equal, round, and reactive to light.  Neck:     Musculoskeletal: Normal range of motion.  Cardiovascular:     Rate and Rhythm: Normal rate and regular rhythm.     Heart sounds: Normal heart sounds.  Pulmonary:     Effort: Pulmonary effort is normal. No respiratory distress.     Breath sounds: Normal breath sounds. No stridor. No wheezing or rhonchi.     Comments: Unlabored, NAD Abdominal:     General: Bowel sounds are normal.     Palpations: Abdomen is soft.     Tenderness: There is no abdominal tenderness.     Hernia: No hernia is present.  Musculoskeletal: Normal range of motion.  Skin:    General: Skin is warm and dry.  Neurological:     Mental Status: He is alert and oriented to person, place, and time.      ED Treatments / Results  Labs (all labs ordered are listed, but only abnormal results are displayed) Labs Reviewed  BASIC METABOLIC PANEL - Abnormal; Notable for the following components:      Result Value   Sodium 126 (*)    Chloride 93 (*)  CO2 20 (*)    Glucose, Bld 545 (*)    BUN 40 (*)    Creatinine, Ser 1.33 (*)    GFR calc non Af Amer 54 (*)    All other components within normal limits  CBC - Abnormal;  Notable for the following components:   WBC 15.2 (*)    All other components within normal limits  URINALYSIS, ROUTINE W REFLEX MICROSCOPIC - Abnormal; Notable for the following components:   Glucose, UA >=500 (*)    Ketones, ur 5 (*)    Bacteria, UA RARE (*)    All other components within normal limits  POCT I-STAT EG7 - Abnormal; Notable for the following components:   pCO2, Ven 41.6 (*)    pO2, Ven 77.0 (*)    Sodium 125 (*)    Potassium >8.5 (*)    Calcium, Ion 0.97 (*)    All other components within normal limits  CBG MONITORING, ED - Abnormal; Notable for the following components:   Glucose-Capillary 450 (*)    All other components within normal limits  CBG MONITORING, ED - Abnormal; Notable for the following components:   Glucose-Capillary 318 (*)    All other components within normal limits    EKG EKG Interpretation  Date/Time:  Tuesday March 11 2019 17:36:34 EST Ventricular Rate:  91 PR Interval:  126 QRS Duration: 88 QT Interval:  346 QTC Calculation: 425 R Axis:   64 Text Interpretation: Normal sinus rhythm Nonspecific ST and T wave abnormality Abnormal ECG Interpretation limited secondary to artifact Confirmed by Ripley Fraise [phone]) on 03/12/2019 12:37:56 AM   Radiology Dg Chest 2 View  Result Date: 03/11/2019 CLINICAL DATA:  Fatigue, hyperglycemic, shortness of breath EXAM: CHEST - 2 VIEW COMPARISON:  Radiograph 05/03/2018 FINDINGS: Bandlike opacity in the right mid lung may reflect atelectasis or scarring. Some patchy opacity seen in the right infrahilar lung with air bronchograms and airways thickening. No consolidation, features of edema, pneumothorax, or effusion. Pulmonary vascularity is normally distributed. The cardiomediastinal contours are unremarkable. No acute osseous or soft tissue abnormality. IMPRESSION: 1. Bandlike opacity in the right mid lung may reflect atelectasis or scarring. 2. Some patchy opacity in the right infrahilar lung with air  bronchograms and airways thickening, suspicious for bronchopneumonia. Electronically Signed   By: Lovena Le M.D.   On: 03/11/2019 23:19    Procedures Procedures (including critical care time)  Medications Ordered in ED Medications  sodium chloride flush (NS) 0.9 % injection 3 mL (3 mLs Intravenous Not Given 03/11/19 2317)  sodium chloride 0.9 % bolus 1,000 mL ( Intravenous Stopped 03/12/19 0027)  sodium chloride 0.9 % bolus 1,000 mL (0 mLs Intravenous Stopped 03/12/19 0226)  insulin aspart (novoLOG) injection 10 Units (10 Units Intravenous Given 03/12/19 0109)  doxycycline (VIBRA-TABS) tablet 100 mg (100 mg Oral Given 03/12/19 0256)     Initial Impression / Assessment and Plan / ED Course  I have reviewed the triage vital signs and the nursing notes.  Pertinent labs & imaging results that were available during my care of the patient were reviewed by me and considered in my medical decision making (see chart for details).  68 year old male presenting to the ED "feeling bad" for a few days now.  He had steroid injections in both of his shoulders on Friday and since then he is just felt very fatigued.  Was seen by PCP today and had blood work done with significant hyperglycemia with glucose into the 600s as well as some  electrolyte disturbance.  He was sent here for further management.  He is awake, alert, appropriately oriented.  His neurologic exam is nonfocal.  His vitals are stable.  Screening labs obtained from triage with hyperglycemia but normal anion gap of 13 and bicarb minimally low at 20.  VBG is also reassuring with normal pH of 7.3. note of K+ > 8.5 on VBG however normal serum level.  This may represent some hemolysis.  Patient was fluid resuscitated here and given IV insulin with improvement of glucose to around 300.  He also had complaints of some shortness of breath and a dry cough recently.  Chest x-ray does reveal infiltrate concerning for pneumonia.  He had a negative Covid  screen earlier today so this is likely bacterial source.  I have offered him repeat PCR COVID testing as his was a rapid screen, however he has declined.  Patient was able to ambulate and maintain O2 sats of 95% or greater.  I do feel he is stable for discharge home.  He is only on Metformin once daily, will have him increase this to twice daily for the next week, we will also start doxycycline for his CAP.  He will need close follow-up with his PCP.  He was given strict return precautions for any new/acute changes.  Case discussed with attending physician, Dr. Wilson Singer, who agrees with treatment plan.  Paul [name] was evaluated in Emergency Department on 03/12/2019 for the symptoms described in the history of present illness. He was evaluated in the context of the global COVID-19 pandemic, which necessitated consideration that the patient might be at risk for infection with the SARS-CoV-2 virus that causes COVID-19. Institutional protocols and algorithms that pertain to the evaluation of patients at risk for COVID-19 are in a state of rapid change based on information released by regulatory bodies including the CDC and federal and state organizations. These policies and algorithms were followed during the patient's care in the ED.   Final Clinical Impressions(s) / ED Diagnoses   Final diagnoses:  Hyperglycemia  Community acquired pneumonia, unspecified laterality    ED Discharge Orders    None       Larene Pickett, PA-C 03/12/19 0327    Virgel Manifold, MD 03/12/19 [phone]

## 2019-03-12 DIAGNOSIS — E1165 Type 2 diabetes mellitus with hyperglycemia: Secondary | ICD-10-CM | POA: Diagnosis not present

## 2019-03-12 LAB — CBG MONITORING, ED
Glucose-Capillary: 318 mg/dL — ABNORMAL HIGH (ref 70–99)
Glucose-Capillary: 450 mg/dL — ABNORMAL HIGH (ref 70–99)

## 2019-03-12 LAB — POCT I-STAT EG7
Acid-base deficit: 2 mmol/L (ref 0.0–2.0)
Bicarbonate: 23.4 mmol/L (ref 20.0–28.0)
Calcium, Ion: 0.97 mmol/L — ABNORMAL LOW (ref 1.15–1.40)
HCT: 47 % (ref 39.0–52.0)
Hemoglobin: 16 g/dL (ref 13.0–17.0)
O2 Saturation: 95 %
Potassium: >8.5 mmol/L (ref 3.5–5.1)
Sodium: 125 mmol/L — ABNORMAL LOW (ref 135–145)
TCO2: 25 mmol/L (ref 22–32)
pCO2, Ven: 41.6 mmHg — ABNORMAL LOW (ref 44.0–60.0)
pH, Ven: 7.358 (ref 7.250–7.430)
pO2, Ven: 77 mmHg — ABNORMAL HIGH (ref 32.0–45.0)

## 2019-03-12 MED ORDER — DOXYCYCLINE HYCLATE 100 MG PO CAPS: 100.0000 mg | ORAL_CAPSULE | Freq: Two times a day (BID) | ORAL | 0 refills | Status: DC

## 2019-03-12 MED ORDER — SODIUM CHLORIDE 0.9 % IV BOLUS
1000.0000 mL | Freq: Once | INTRAVENOUS | Status: AC
  Administered 2019-03-12: 1000 mL via INTRAVENOUS

## 2019-03-12 MED ORDER — DOXYCYCLINE HYCLATE 100 MG PO TABS
100.0000 mg | ORAL_TABLET | Freq: Once | ORAL | Status: AC
  Administered 2019-03-12: 03:00:00 100 mg via ORAL
  Filled 2019-03-12: qty 1

## 2019-03-12 MED ORDER — INSULIN ASPART 100 UNIT/ML ~~LOC~~ SOLN
10.0000 [IU] | Freq: Once | SUBCUTANEOUS | Status: AC
  Administered 2019-03-12: 01:00:00 10 [IU] via INTRAVENOUS

## 2019-03-12 NOTE — Discharge Instructions (Signed)
Increase your metformin twice daily for the next week.  You will need to keep a close check of your blood sugar. Start the doxycyline in the morning for the pneumonia.  Can take tylenol for fever. Follow-up with your primary care doctor for re-check of both of these items. Return here for any new/acute changes.

## 2019-03-12 NOTE — ED Notes (Signed)
Pt ambulated at what he stated was his baseline. O2 sats never dropped below 95% on RA. Pt walked independently, slowly with a slight sway but maintained balance well. Pt added that he and his daughter have been talking about getting a walker in the near future and that he wouldn't be opposed to that. Paul [name], [place] aware.

## 2019-03-13 ENCOUNTER — Other Ambulatory Visit: Payer: Self-pay

## 2019-03-13 ENCOUNTER — Emergency Department (HOSPITAL_COMMUNITY)
Admission: EM | Admit: 2019-03-13 | Discharge: 2019-03-13 | Disposition: A | Discharge status: Home / Self Care | Payer: Medicare Other | Attending: Emergency Medicine | Admitting: Emergency Medicine

## 2019-03-13 ENCOUNTER — Emergency Department (HOSPITAL_COMMUNITY): Payer: Medicare Other

## 2019-03-13 DIAGNOSIS — Z7902 Long term (current) use of antithrombotics/antiplatelets: Secondary | ICD-10-CM | POA: Diagnosis not present

## 2019-03-13 DIAGNOSIS — Z87891 Personal history of nicotine dependence: Secondary | ICD-10-CM | POA: Insufficient documentation

## 2019-03-13 DIAGNOSIS — Z7984 Long term (current) use of oral hypoglycemic drugs: Secondary | ICD-10-CM | POA: Diagnosis not present

## 2019-03-13 DIAGNOSIS — R3915 Urgency of urination: Secondary | ICD-10-CM | POA: Insufficient documentation

## 2019-03-13 DIAGNOSIS — Z9119 Patient's noncompliance with other medical treatment and regimen: Secondary | ICD-10-CM | POA: Diagnosis not present

## 2019-03-13 DIAGNOSIS — Z79899 Other long term (current) drug therapy: Secondary | ICD-10-CM | POA: Insufficient documentation

## 2019-03-13 DIAGNOSIS — E1151 Type 2 diabetes mellitus with diabetic peripheral angiopathy without gangrene: Secondary | ICD-10-CM | POA: Insufficient documentation

## 2019-03-13 DIAGNOSIS — R739 Hyperglycemia, unspecified: Secondary | ICD-10-CM

## 2019-03-13 DIAGNOSIS — E1165 Type 2 diabetes mellitus with hyperglycemia: Secondary | ICD-10-CM | POA: Insufficient documentation

## 2019-03-13 DIAGNOSIS — J449 Chronic obstructive pulmonary disease, unspecified: Secondary | ICD-10-CM | POA: Insufficient documentation

## 2019-03-13 DIAGNOSIS — I10 Essential (primary) hypertension: Secondary | ICD-10-CM | POA: Insufficient documentation

## 2019-03-13 LAB — URINALYSIS, ROUTINE W REFLEX MICROSCOPIC
Bacteria, UA: NONE SEEN
Bilirubin Urine: NEGATIVE
Glucose, UA: >=500 mg/dL — AB
Hgb urine dipstick: NEGATIVE
Ketones, ur: NEGATIVE mg/dL
Leukocytes,Ua: NEGATIVE
Nitrite: NEGATIVE
Protein, ur: NEGATIVE mg/dL
Specific Gravity, Urine: 1.026 (ref 1.005–1.030)
pH: 6 (ref 5.0–8.0)

## 2019-03-13 LAB — CBC
HCT: 48.1 % (ref 39.0–52.0)
Hemoglobin: 16.7 g/dL (ref 13.0–17.0)
MCH: 31.2 pg (ref 26.0–34.0)
MCHC: 34.7 g/dL (ref 30.0–36.0)
MCV: 89.7 fL (ref 80.0–100.0)
Platelets: 304 10*3/uL (ref 150–400)
RBC: 5.36 MIL/uL (ref 4.22–5.81)
RDW: 11.9 % (ref 11.5–15.5)
WBC: 17 10*3/uL — ABNORMAL HIGH (ref 4.0–10.5)
nRBC: 0 % (ref 0.0–0.2)

## 2019-03-13 LAB — BASIC METABOLIC PANEL
Anion gap: 11 (ref 5–15)
BUN: 25 mg/dL — ABNORMAL HIGH (ref 8–23)
CO2: 22 mmol/L (ref 22–32)
Calcium: 9.4 mg/dL (ref 8.9–10.3)
Chloride: 99 mmol/L (ref 98–111)
Creatinine, Ser: 1.11 mg/dL (ref 0.61–1.24)
GFR calc Af Amer: >60 mL/min (ref >60)
GFR calc non Af Amer: >60 mL/min (ref >60)
Glucose, Bld: 428 mg/dL — ABNORMAL HIGH (ref 70–99)
Potassium: 4.4 mmol/L (ref 3.5–5.1)
Sodium: 132 mmol/L — ABNORMAL LOW (ref 135–145)

## 2019-03-13 LAB — CBG MONITORING, ED
Glucose-Capillary: 417 mg/dL — ABNORMAL HIGH (ref 70–99)
Glucose-Capillary: 439 mg/dL — ABNORMAL HIGH (ref 70–99)
Glucose-Capillary: 319 mg/dL — ABNORMAL HIGH (ref 70–99)
Glucose-Capillary: 313 mg/dL — ABNORMAL HIGH (ref 70–99)

## 2019-03-13 MED ORDER — SODIUM CHLORIDE 0.9 % IV BOLUS
1000.0000 mL | Freq: Once | INTRAVENOUS | Status: AC
  Administered 2019-03-13: 1000 mL via INTRAVENOUS

## 2019-03-13 NOTE — ED Notes (Signed)
Pt ambulatory with moderately unsteady gait, per usual at home per pt. Pt states he feels he is ambulating better than he does at home.

## 2019-03-13 NOTE — ED Notes (Signed)
Patient verbalizes understanding of discharge instructions. Opportunity for questioning and answers were provided. Armband removed by staff, pt discharged from ED.  

## 2019-03-13 NOTE — ED Provider Notes (Signed)
[facility] EMERGENCY DEPARTMENT Provider Note   CSN: [id number] Arrival date & time: 03/13/19  1428     History   Chief Complaint Chief Complaint  Patient presents with   Hyperglycemia    HPI Paul [name] is a 68 y.o. male.     HPI  Pt is a 68 year old male with PMH of DMII, HTN, HLD, OSA, COPD, CAD s/p PCI who presents to the ED with concern for hyperglycemia and fatigue.  Patient reports his roommate helps him take care of his medication so he is not sure what he is taking but should be taking all of his medications as prescribed including his Metformin.  Patient reports he has been feeling more tired than normal and thinks his sugar may be high.  He also endorses 2 weeks of increased urinary frequency and urgency.  Patient endorses ongoing cough but no fever.  He denies any shortness of breath.  No chest pain.  Patient denies abdominal pain, nausea, vomiting, diarrhea.  Patient was seen in the emergency department yesterday and prescribed doxycycline for pneumonia.  He reports he thinks he should be taking it because his roommate picked it up at the pharmacy last night.  Past Medical History:  Diagnosis Date   CAD S/P percutaneous coronary angioplasty -- D1, Xience Xpedition DES 2.5 mm x 33 mm 11/24/2011   a) Mild-to-moderate 30-40% lesions in the RCA, LAD and Circumflex. b) CULPRIT LESION: long tubular 70-80% lesion in D1 with FFR of 0.7 --> PCI w/ Xience Xpedition DES 2.5 mm x 30 mm (2.65 MM); c) Lexiscan Myoview 11/2013: No Ischemia or Infarct (Inferior Gut Attenuation) EF 63%.   COPD (chronic obstructive pulmonary disease) ([place])    "don't have full case of it; I'm right there at it"   Essential hypertension 11/24/2011   Exertional dyspnea, chronic    Gout    History of Unstable angina 11/24/2011   Referred for cardiac catheterization   Hyperlipidemia with target LDL less than 70 11/24/2011   Obesity (BMI 30.0-34.9)    OSA (obstructive sleep apnea), uses  oxygen at home did not tolerate cpap 11/24/2011    Patient Active Problem List   Diagnosis Date Noted   Primary osteoarthritis, left shoulder 07/29/2018   Prolonged QT interval    Labile blood glucose    Type 2 diabetes mellitus with peripheral neuropathy (HCC)    Labile blood pressure    Sleep disturbance    Hypoalbuminemia due to protein-calorie malnutrition ([place])    Poorly controlled type 2 diabetes mellitus with peripheral neuropathy (HCC)    Dysphagia    Candidiasis    Acute encephalopathy 05/03/2018   Pressure injury of skin 04/30/2018   Coronary artery disease involving native coronary artery of native heart without angina pectoris    Tobacco abuse    Hypertension    Acute blood loss anemia    Bacteremia    Respiratory failure ([place])    CAP (community acquired pneumonia) 04/21/2018   Corneal irritation of both eyes 10/31/2017   Allergic rhinitis due to animal hair and dander 10/31/2017   OSA on CPAP 10/10/2016   OSA and COPD overlap syndrome ([place]) 03/02/2016   Bronchitis, chronic obstructive, with exacerbation ([place]) 03/02/2016   Hypoxemia 03/02/2016   Dependence on supplemental oxygen 03/02/2016   CAD, multiple vessel 03/02/2016   Gastroesophageal reflux disease 03/02/2016   HTN (hypertension), malignant 03/02/2016   Cigarette smoker 02/22/2014   DOE (dyspnea on exertion) 01/20/2014   Obesity (BMI 30.0-34.9)  Essential hypertension 11/24/2011   Hyperlipidemia with target LDL less than 70 11/24/2011   OSA (obstructive sleep apnea), uses oxygen at home did not tolerate cpap 11/24/2011   COPD 11/24/2011   CAD S/P percutaneous coronary angioplasty -- D1, Xience Xpedition DES 2.5 mm x 33 mm 11/24/2011    Past Surgical History:  Procedure Laterality Date   CERVICAL SPINE SURGERY  2012   CORONARY ANGIOPLASTY WITH STENT PLACEMENT  11/23/2011   "1; first one"   LEFT HEART CATHETERIZATION WITH CORONARY ANGIOGRAM N/A 11/23/2011    Procedure: LEFT HEART CATHETERIZATION WITH CORONARY ANGIOGRAM;  Surgeon: Leonie Man, MD;  Location: [facility] CATH LAB;  Service: Cardiovascular;  Laterality: N/A;        Home Medications    Prior to Admission medications   Medication Sig Start Date End Date Taking? Authorizing Provider  acetaminophen (TYLENOL) 650 MG CR tablet Take 1,950 mg by mouth daily as needed for pain.    Yes [provider]  ALPRAZolam (XANAX) 0.25 MG tablet Take 0.5 tablets (0.125 mg total) by mouth every 6 (six) hours as needed for anxiety. 05/22/18  Yes Angiulli, Lavon Paganini, PA-C  amLODipine (NORVASC) 5 MG tablet Take 1 tablet by mouth daily. 07/01/18  Yes [provider]  clopidogrel (PLAVIX) 75 MG tablet Take 1 tablet (75 mg total) by mouth daily. 05/22/18  Yes Angiulli, Lavon Paganini, PA-C  colchicine 0.6 MG tablet Take 1 tablet (0.6 mg total) by mouth daily. 05/22/18  Yes Angiulli, Lavon Paganini, PA-C  diclofenac Sodium (VOLTAREN) 1 % GEL Apply 2 g topically 2 (two) times daily as needed (shoulder pain).   Yes [provider]  doxycycline (VIBRAMYCIN) 100 MG capsule Take 1 capsule (100 mg total) by mouth 2 (two) times daily. 03/12/19  Yes Larene Pickett, PA-C  gabapentin (NEURONTIN) 300 MG capsule Take 300-600 mg by mouth See admin instructions. Take 300 mg by mouth in the morning and 600 mg at bedtime   Yes [provider]  glipiZIDE (GLUCOTROL XL) 2.5 MG 24 hr tablet Take 2.5 mg by mouth every morning. 02/24/19  Yes [provider]  guaiFENesin (MUCINEX) 600 MG 12 hr tablet Take 2 tablets (1,200 mg total) by mouth 2 (two) times daily. 05/03/18  Yes Sheikh, Omair Latif, DO  loratadine (CLARITIN) 10 MG tablet Take 1 tablet (10 mg total) by mouth daily. 05/22/18  Yes Angiulli, Lavon Paganini, PA-C  Menthol-Methyl Salicylate (MUSCLE RUB) 10-15 % CREA Apply 1 application topically as needed for muscle pain.   Yes [provider]  metFORMIN (GLUCOPHAGE) 500 MG tablet Take 0.5 tablets (250 mg  total) by mouth daily with breakfast. Patient taking differently: Take 500 mg by mouth daily with breakfast.  05/22/18  Yes Angiulli, Lavon Paganini, PA-C  metoprolol tartrate (LOPRESSOR) 25 MG tablet Take 1 tablet (25 mg total) by mouth 2 (two) times daily. 05/22/18  Yes Angiulli, Lavon Paganini, PA-C  umeclidinium-vilanterol (ANORO ELLIPTA) 62.5-25 MCG/INH AEPB Inhale 1 puff into the lungs daily. 07/31/18  Yes Collene Gobble, MD  zolpidem (AMBIEN) 10 MG tablet Take 10 mg by mouth at bedtime.  07/12/18  Yes [provider]  arformoterol (BROVANA) 15 MCG/2ML NEBU Take 2 mLs (15 mcg total) by nebulization 2 (two) times daily. Patient not taking: Reported on 03/11/2019 05/03/18   Raiford Noble Latif, DO  budesonide (PULMICORT) 0.5 MG/2ML nebulizer solution Take 2 mLs (0.5 mg total) by nebulization 2 (two) times daily. Patient not taking: Reported on 03/13/2019 05/03/18   Alfredia Ferguson,  Omair Latif, DO  esomeprazole (NEXIUM) 20 MG capsule Take 1 capsule (20 mg total) by mouth daily at 12 noon. Patient not taking: Reported on 03/11/2019 05/22/18   Angiulli, Lavon Paganini, PA-C  ipratropium-albuterol (DUONEB) 0.5-2.5 (3) MG/3ML SOLN Take 3 mLs by nebulization 3 (three) times daily. Patient not taking: Reported on 03/13/2019 05/03/18   Raiford Noble Latif, DO  lidocaine (LIDODERM) 5 % Place 1 patch onto the skin daily. Remove & Discard patch within 12 hours or as directed by MD Patient not taking: Reported on 03/13/2019 05/22/18   Angiulli, Lavon Paganini, PA-C  Melatonin 3 MG TABS Take 1 tablet (3 mg total) by mouth at bedtime. Patient not taking: Reported on 03/13/2019 05/22/18   Cathlyn Parsons, PA-C  Multiple Vitamin (MULTIVITAMIN WITH MINERALS) TABS tablet Take 1 tablet by mouth daily. Patient not taking: Reported on 03/13/2019 05/22/18   Angiulli, Lavon Paganini, PA-C  nitroGLYCERIN (NITROSTAT) 0.4 MG SL tablet Place 1 tablet (0.4 mg total) under the tongue every 5 (five) minutes as needed for chest pain. 11/24/11 03/11/19  Isaiah Serge, NP  umeclidinium-vilanterol (ANORO ELLIPTA) 62.5-25 MCG/INH AEPB Inhale 1 puff into the lungs daily. Patient not taking: Reported on 03/13/2019 11/28/18   Collene Gobble, MD  umeclidinium-vilanterol [facility] ELLIPTA) 62.5-25 MCG/INH AEPB Inhale 1 puff into the lungs daily. Patient not taking: Reported on 03/13/2019 11/28/18   Collene Gobble, MD    Family History Family History  Problem Relation Age of Onset   Heart disease Sister    Heart attack Brother    Emphysema Father        smoked   Aneurysm Father     Social History Social History   Tobacco Use   Smoking status: Former Smoker    Packs/day: 1.00    Years: 40.00    Pack years: 40.00    Types: Cigarettes    Quit date: 06/10/2016    Years since quitting: 2.7   Smokeless tobacco: Never Used   Tobacco comment: 02/18/14- smokes occ cig maybe 3 x per wk  Substance Use Topics   Alcohol use: Yes    Alcohol/week: 0.0 standard drinks    Comment: social 2-3 drink a week   Drug use: No     Allergies   Codeine and Duloxetine hcl   Review of Systems Review of Systems  Constitutional: Positive for appetite change and fatigue. Negative for chills and fever.  HENT: Negative for congestion.   Eyes: Negative for pain and visual disturbance.  Respiratory: Positive for cough. Negative for shortness of breath.   Cardiovascular: Negative for chest pain and palpitations.  Gastrointestinal: Negative for abdominal pain and vomiting.  Genitourinary: Positive for frequency and urgency. Negative for dysuria and hematuria.  Musculoskeletal: Negative for arthralgias and back pain.  Skin: Negative for color change and rash.  Neurological: Negative for seizures and syncope.  All other systems reviewed and are negative.    Physical Exam Updated Vital Signs BP (!) 144/85    Pulse 74    Temp 97.8 F (36.6 C) (Oral)    Resp 18    SpO2 96%   Physical Exam Vitals signs and nursing note reviewed.  Constitutional:       Appearance: He is well-developed.  HENT:     Head: Normocephalic and atraumatic.     Mouth/Throat:     Mouth: Mucous membranes are dry.  Eyes:     Extraocular Movements: Extraocular movements intact.     Conjunctiva/sclera: Conjunctivae normal.  Pupils: Pupils are equal, round, and reactive to light.  Neck:     Musculoskeletal: Neck supple.  Cardiovascular:     Rate and Rhythm: Normal rate and regular rhythm.     Heart sounds: No murmur.  Pulmonary:     Effort: Pulmonary effort is normal. No respiratory distress.     Breath sounds: Normal breath sounds.  Abdominal:     Palpations: Abdomen is soft.     Tenderness: There is no abdominal tenderness.  Skin:    General: Skin is warm and dry.  Neurological:     General: No focal deficit present.     Mental Status: He is alert. Mental status is at baseline.     Sensory: No sensory deficit.     Motor: No weakness.      ED Treatments / Results  Labs (all labs ordered are listed, but only abnormal results are displayed) Labs Reviewed  BASIC METABOLIC PANEL - Abnormal; Notable for the following components:      Result Value   Sodium 132 (*)    Glucose, Bld 428 (*)    BUN 25 (*)    All other components within normal limits  CBC - Abnormal; Notable for the following components:   WBC 17.0 (*)    All other components within normal limits  URINALYSIS, ROUTINE W REFLEX MICROSCOPIC - Abnormal; Notable for the following components:   Glucose, UA >=500 (*)    All other components within normal limits  CBG MONITORING, ED - Abnormal; Notable for the following components:   Glucose-Capillary 439 (*)    All other components within normal limits  CBG MONITORING, ED - Abnormal; Notable for the following components:   Glucose-Capillary 417 (*)    All other components within normal limits  CBG MONITORING, ED - Abnormal; Notable for the following components:   Glucose-Capillary 319 (*)    All other components within normal limits  CBG  MONITORING, ED - Abnormal; Notable for the following components:   Glucose-Capillary 313 (*)    All other components within normal limits    EKG EKG Interpretation  Date/Time:  Thursday March 13 2019 15:45:49 EST Ventricular Rate:  77 PR Interval:    QRS Duration: 91 QT Interval:  388 QTC Calculation: 440 R Axis:   66 Text Interpretation: Sinus rhythm Borderline low voltage, extremity leads Confirmed by Quintella Reichert [phone]) on 03/13/2019 3:53:15 PM   Radiology Dg Chest 2 View  Result Date: 03/11/2019 CLINICAL DATA:  Fatigue, hyperglycemic, shortness of breath EXAM: CHEST - 2 VIEW COMPARISON:  Radiograph 05/03/2018 FINDINGS: Bandlike opacity in the right mid lung may reflect atelectasis or scarring. Some patchy opacity seen in the right infrahilar lung with air bronchograms and airways thickening. No consolidation, features of edema, pneumothorax, or effusion. Pulmonary vascularity is normally distributed. The cardiomediastinal contours are unremarkable. No acute osseous or soft tissue abnormality. IMPRESSION: 1. Bandlike opacity in the right mid lung may reflect atelectasis or scarring. 2. Some patchy opacity in the right infrahilar lung with air bronchograms and airways thickening, suspicious for bronchopneumonia. Electronically Signed   By: Lovena Le M.D.   On: 03/11/2019 23:19   Dg Chest Portable 1 View  Result Date: 03/13/2019 CLINICAL DATA:  Recent concern for pneumonia.  Fatigue. EXAM: PORTABLE CHEST 1 VIEW COMPARISON:  March 11, 2019 FINDINGS: The heart size and mediastinal contours are within normal limits. Both lungs are clear. The visualized skeletal structures are unremarkable. IMPRESSION: No active disease. Electronically Signed   By: Harrell Gave  Green M.D.   On: 03/13/2019 16:02    Procedures Procedures (including critical care time)  Medications Ordered in ED Medications  sodium chloride 0.9 % bolus 1,000 mL (0 mLs Intravenous Stopped 03/13/19 1816)    sodium chloride 0.9 % bolus 1,000 mL (0 mLs Intravenous Stopped 03/13/19 1816)     Initial Impression / Assessment and Plan / ED Course  I have reviewed the triage vital signs and the nursing notes.  Pertinent labs & imaging results that were available during my care of the patient were reviewed by me and considered in my medical decision making (see chart for details).       On arrival, patient is afebrile, HDS.  Patient is alert and oriented but a poor historian.  Glucose found to be elevated but no evidence of DKA/HHS. Patient given IV fluids.  Abdomen is soft, benign, nonfocal nontender. UA: without signs of acute infection  Repeat chest x-ray: no acute findings to suggest PNA EKG: NSR, similar to prior  Upon reassessment, Glucose improved to 313  Patient is overall well-appearing and stable.  Overall, concern for hyperglycemia in setting of concern for noncompliance as an outpatient as well as recent steroid injections.  Patient does have leukocytosis and reports ongoing cough.  Chest x-ray actually looks better when compared to prior.  Low suspicion for acute infectious process.  Abdomen is soft and benign.  Patient ambulated and reports he is at his baseline.  Upon discussion with patient's daughter.  She states he is following with his PCP who has ordered home health PT for patient's difficulty walking.  Patient has a roommate who can help him with his medications.  Long discussion with patient regarding take medications as prescribed.  He endorses understanding.  Patient is alert and oriented and stable for discharge at this time.  Strict return precautions given.  Final Clinical Impressions(s) / ED Diagnoses   Final diagnoses:  Hyperglycemia  Urinary urgency    ED Discharge Orders    None       Burns Spain, MD 03/13/19 2046    Quintella Reichert, MD 03/15/19 1248

## 2019-03-13 NOTE — ED Triage Notes (Signed)
Pt sts he was here for hyperglycemia earlier this week and feels some better but similar to then. Checked his sugar this morning but doesn't remember what it was. His daughter says he does not check his cbg often enough. Sts "I just feel bad." Pt is not taking his insulin or other medications correctly due to feeling overwhelmed/confused by the amount of medication he is supposed to take. Pt sts this week he's had new onset incontinence.

## 2019-03-13 NOTE — ED Notes (Signed)
Portable xray at bedside.

## 2019-03-13 NOTE — ED Notes (Signed)
CBG 313, RN Patti informed

## 2019-03-19 ENCOUNTER — Ambulatory Visit: Payer: Medicare Other | Admitting: Orthopedic Surgery

## 2019-03-24 DIAGNOSIS — E785 Hyperlipidemia, unspecified: Secondary | ICD-10-CM | POA: Diagnosis not present

## 2019-03-24 DIAGNOSIS — M19012 Primary osteoarthritis, left shoulder: Secondary | ICD-10-CM | POA: Diagnosis not present

## 2019-03-24 DIAGNOSIS — K219 Gastro-esophageal reflux disease without esophagitis: Secondary | ICD-10-CM | POA: Diagnosis not present

## 2019-03-24 DIAGNOSIS — D649 Anemia, unspecified: Secondary | ICD-10-CM | POA: Diagnosis not present

## 2019-03-24 DIAGNOSIS — I2510 Atherosclerotic heart disease of native coronary artery without angina pectoris: Secondary | ICD-10-CM | POA: Diagnosis not present

## 2019-03-24 DIAGNOSIS — Z7984 Long term (current) use of oral hypoglycemic drugs: Secondary | ICD-10-CM | POA: Diagnosis not present

## 2019-03-24 DIAGNOSIS — M109 Gout, unspecified: Secondary | ICD-10-CM | POA: Diagnosis not present

## 2019-03-24 DIAGNOSIS — E1165 Type 2 diabetes mellitus with hyperglycemia: Secondary | ICD-10-CM | POA: Diagnosis not present

## 2019-03-24 DIAGNOSIS — Z6831 Body mass index (BMI) 31.0-31.9, adult: Secondary | ICD-10-CM | POA: Diagnosis not present

## 2019-03-24 DIAGNOSIS — Z87891 Personal history of nicotine dependence: Secondary | ICD-10-CM | POA: Diagnosis not present

## 2019-03-24 DIAGNOSIS — T380X5D Adverse effect of glucocorticoids and synthetic analogues, subsequent encounter: Secondary | ICD-10-CM | POA: Diagnosis not present

## 2019-03-24 DIAGNOSIS — G4733 Obstructive sleep apnea (adult) (pediatric): Secondary | ICD-10-CM | POA: Diagnosis not present

## 2019-03-24 DIAGNOSIS — J449 Chronic obstructive pulmonary disease, unspecified: Secondary | ICD-10-CM | POA: Diagnosis not present

## 2019-03-24 DIAGNOSIS — Z9981 Dependence on supplemental oxygen: Secondary | ICD-10-CM | POA: Diagnosis not present

## 2019-03-24 DIAGNOSIS — I10 Essential (primary) hypertension: Secondary | ICD-10-CM | POA: Diagnosis not present

## 2019-03-24 DIAGNOSIS — E669 Obesity, unspecified: Secondary | ICD-10-CM | POA: Diagnosis not present

## 2019-03-24 DIAGNOSIS — E1142 Type 2 diabetes mellitus with diabetic polyneuropathy: Secondary | ICD-10-CM | POA: Diagnosis not present

## 2019-03-27 DIAGNOSIS — M109 Gout, unspecified: Secondary | ICD-10-CM | POA: Diagnosis not present

## 2019-03-27 DIAGNOSIS — E1165 Type 2 diabetes mellitus with hyperglycemia: Secondary | ICD-10-CM | POA: Diagnosis not present

## 2019-03-27 DIAGNOSIS — I2510 Atherosclerotic heart disease of native coronary artery without angina pectoris: Secondary | ICD-10-CM | POA: Diagnosis not present

## 2019-03-27 DIAGNOSIS — I10 Essential (primary) hypertension: Secondary | ICD-10-CM | POA: Diagnosis not present

## 2019-03-27 DIAGNOSIS — T380X5D Adverse effect of glucocorticoids and synthetic analogues, subsequent encounter: Secondary | ICD-10-CM | POA: Diagnosis not present

## 2019-03-27 DIAGNOSIS — E1142 Type 2 diabetes mellitus with diabetic polyneuropathy: Secondary | ICD-10-CM | POA: Diagnosis not present

## 2019-03-28 DIAGNOSIS — T380X5D Adverse effect of glucocorticoids and synthetic analogues, subsequent encounter: Secondary | ICD-10-CM | POA: Diagnosis not present

## 2019-03-28 DIAGNOSIS — E1142 Type 2 diabetes mellitus with diabetic polyneuropathy: Secondary | ICD-10-CM | POA: Diagnosis not present

## 2019-03-28 DIAGNOSIS — N1831 Chronic kidney disease, stage 3a: Secondary | ICD-10-CM | POA: Insufficient documentation

## 2019-03-28 DIAGNOSIS — M109 Gout, unspecified: Secondary | ICD-10-CM | POA: Diagnosis not present

## 2019-03-28 DIAGNOSIS — E1165 Type 2 diabetes mellitus with hyperglycemia: Secondary | ICD-10-CM | POA: Diagnosis not present

## 2019-03-28 DIAGNOSIS — I10 Essential (primary) hypertension: Secondary | ICD-10-CM | POA: Diagnosis not present

## 2019-03-28 DIAGNOSIS — I2510 Atherosclerotic heart disease of native coronary artery without angina pectoris: Secondary | ICD-10-CM | POA: Diagnosis not present

## 2019-03-31 DIAGNOSIS — I10 Essential (primary) hypertension: Secondary | ICD-10-CM | POA: Diagnosis not present

## 2019-03-31 DIAGNOSIS — E1142 Type 2 diabetes mellitus with diabetic polyneuropathy: Secondary | ICD-10-CM | POA: Diagnosis not present

## 2019-03-31 DIAGNOSIS — I2510 Atherosclerotic heart disease of native coronary artery without angina pectoris: Secondary | ICD-10-CM | POA: Diagnosis not present

## 2019-03-31 DIAGNOSIS — M109 Gout, unspecified: Secondary | ICD-10-CM | POA: Diagnosis not present

## 2019-03-31 DIAGNOSIS — E1165 Type 2 diabetes mellitus with hyperglycemia: Secondary | ICD-10-CM | POA: Diagnosis not present

## 2019-03-31 DIAGNOSIS — T380X5D Adverse effect of glucocorticoids and synthetic analogues, subsequent encounter: Secondary | ICD-10-CM | POA: Diagnosis not present

## 2019-04-02 ENCOUNTER — Ambulatory Visit (INDEPENDENT_AMBULATORY_CARE_PROVIDER_SITE_OTHER): Payer: Medicare Other | Admitting: Orthopedic Surgery

## 2019-04-02 ENCOUNTER — Other Ambulatory Visit: Payer: Self-pay

## 2019-04-02 DIAGNOSIS — M19012 Primary osteoarthritis, left shoulder: Secondary | ICD-10-CM

## 2019-04-03 ENCOUNTER — Encounter: Payer: Self-pay | Admitting: Orthopedic Surgery

## 2019-04-03 DIAGNOSIS — M109 Gout, unspecified: Secondary | ICD-10-CM | POA: Diagnosis not present

## 2019-04-03 DIAGNOSIS — I2510 Atherosclerotic heart disease of native coronary artery without angina pectoris: Secondary | ICD-10-CM | POA: Diagnosis not present

## 2019-04-03 DIAGNOSIS — T380X5D Adverse effect of glucocorticoids and synthetic analogues, subsequent encounter: Secondary | ICD-10-CM | POA: Diagnosis not present

## 2019-04-03 DIAGNOSIS — I10 Essential (primary) hypertension: Secondary | ICD-10-CM | POA: Diagnosis not present

## 2019-04-03 DIAGNOSIS — E1165 Type 2 diabetes mellitus with hyperglycemia: Secondary | ICD-10-CM | POA: Diagnosis not present

## 2019-04-03 DIAGNOSIS — E1142 Type 2 diabetes mellitus with diabetic polyneuropathy: Secondary | ICD-10-CM | POA: Diagnosis not present

## 2019-04-03 NOTE — Progress Notes (Signed)
Office Visit Note   Patient: Paul [name]           Date of Birth: May 08, 1950           MRN: [id number] Visit Date: 04/02/2019 Requested by: Leanna Battles, MD [address] [place],  [place] [address] PCP: Leanna Battles, MD  Subjective: Chief Complaint  Patient presents with  . Right Shoulder - Pain  . Left Shoulder - Pain    HPI: Here is a patient with bilateral shoulder pain left worse than right.  He is right-hand dominant.  He describes decreased range of motion in that left shoulder.  I did review November radiographs which show significant arthritis in that left shoulder with inferior humeral head osteophyte.  He is on blood thinners and does have a history of coronary artery disease along with diabetes.  Had bilateral shoulder injections but his blood glucose increased up to 550.  He has been hurting in that left shoulder for about 2 years.  It does wake him from sleep on some nights.  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              ROS: All systems reviewed are negative as they relate to the chief complaint within the history of present illness.  Patient denies  fevers or chills.   Assessment & Plan: Visit Diagnoses:  1. Arthritis of left shoulder region     Plan: Impression is left shoulder glenohumeral joint arthritis with restricted range of motion and pain.  We talked a lot about reverse shoulder replacement for this problem.  The risk and benefits inherent in those procedures particularly with his medical problems.  He is going to consider if he wants to get that done and when he does he will call Jackelyn Poling and we will get that scheduled.  I do want to get preoperative thin cut CT scan for patient specific instrumentation planning prior to his surgery.  Follow-Up Instructions: Return if symptoms worsen or fail to improve.   Orders:  No orders of the defined types were placed in this encounter.  No orders of the defined types were  placed in this encounter.     Procedures: No procedures performed   Clinical Data: No additional findings.  Objective: Vital Signs: There were no vitals taken for this visit.  Physical Exam:   Constitutional: Patient appears well-developed HEENT:  Head: Normocephalic Eyes:EOM are normal Neck: Normal range of motion Cardiovascular: Normal rate Pulmonary/chest: Effort normal Neurologic: Patient is alert Skin: Skin is warm Psychiatric: Patient has normal mood and affect    Ortho Exam: Ortho exam demonstrates external rotation on the left about 30 degrees on the right is about 60.  Rotator cuff strength is actually pretty good bilaterally infraspinatus supraspinatus and subscap muscle testing.  Does have forward flexion abduction on the left less than 90 degrees.  On the right he has essentially full active and passive range of motion of the shoulder.  Does have coarseness consistent with glenohumeral joint arthritis.  This is on the left-hand side.  No such arthritic coarseness on the right.  Specialty Comments:  No specialty comments available.  Imaging: No results found.   PMFS History: Patient Active Problem List   Diagnosis Date Noted  . Primary osteoarthritis, left shoulder 07/29/2018  . Prolonged QT interval   . Labile blood glucose   . Type 2 diabetes mellitus with peripheral neuropathy (HCC)   . Labile blood pressure   .  Sleep disturbance   . Hypoalbuminemia due to protein-calorie malnutrition ([place])   . Poorly controlled type 2 diabetes mellitus with peripheral neuropathy ([place])   . Dysphagia   . Candidiasis   . Acute encephalopathy 05/03/2018  . Pressure injury of skin 04/30/2018  . Coronary artery disease involving native coronary artery of native heart without angina pectoris   . Tobacco abuse   . Hypertension   . Acute blood loss anemia   . Bacteremia   . Respiratory failure ([place])   . CAP (community acquired pneumonia) 04/21/2018  . Corneal  irritation of both eyes 10/31/2017  . Allergic rhinitis due to animal hair and dander 10/31/2017  . OSA on CPAP 10/10/2016  . OSA and COPD overlap syndrome ([place]) 03/02/2016  . Bronchitis, chronic obstructive, with exacerbation ([place]) 03/02/2016  . Hypoxemia 03/02/2016  . Dependence on supplemental oxygen 03/02/2016  . CAD, multiple vessel 03/02/2016  . Gastroesophageal reflux disease 03/02/2016  . HTN (hypertension), malignant 03/02/2016  . Cigarette smoker 02/22/2014  . DOE (dyspnea on exertion) 01/20/2014  . Obesity (BMI 30.0-34.9)   . Essential hypertension 11/24/2011  . Hyperlipidemia with target LDL less than 70 11/24/2011  . OSA (obstructive sleep apnea), uses oxygen at home did not tolerate cpap 11/24/2011  . COPD 11/24/2011  . CAD S/P percutaneous coronary angioplasty -- D1, Xience Xpedition DES 2.5 mm x 33 mm 11/24/2011   Past Medical History:  Diagnosis Date  . CAD S/P percutaneous coronary angioplasty -- D1, Xience Xpedition DES 2.5 mm x 33 mm 11/24/2011   a) Mild-to-moderate 30-40% lesions in the RCA, LAD and Circumflex. b) CULPRIT LESION: long tubular 70-80% lesion in D1 with FFR of 0.7 --> PCI w/ Xience Xpedition DES 2.5 mm x 30 mm (2.65 MM); c) Lexiscan Myoview 11/2013: No Ischemia or Infarct (Inferior Gut Attenuation) EF 63%.  Marland Kitchen COPD (chronic obstructive pulmonary disease) ([place])    "don't have full case of it; I'm right there at it"  . Essential hypertension 11/24/2011  . Exertional dyspnea, chronic   . Gout   . History of Unstable angina 11/24/2011   Referred for cardiac catheterization  . Hyperlipidemia with target LDL less than 70 11/24/2011  . Obesity (BMI 30.0-34.9)   . OSA (obstructive sleep apnea), uses oxygen at home did not tolerate cpap 11/24/2011    Family History  Problem Relation Age of Onset  . Heart disease Sister   . Heart attack Brother   . Emphysema Father        smoked  . Aneurysm Father     Past Surgical History:  Procedure Laterality Date  .  CERVICAL SPINE SURGERY  2012  . CORONARY ANGIOPLASTY WITH STENT PLACEMENT  11/23/2011   "1; first one"  . LEFT HEART CATHETERIZATION WITH CORONARY ANGIOGRAM N/A 11/23/2011   Procedure: LEFT HEART CATHETERIZATION WITH CORONARY ANGIOGRAM;  Surgeon: Leonie Man, MD;  Location: [facility] CATH LAB;  Service: Cardiovascular;  Laterality: N/A;   Social History   Occupational History  . Not on file  Tobacco Use  . Smoking status: Former Smoker    Packs/day: 1.00    Years: 40.00    Pack years: 40.00    Types: Cigarettes    Quit date: 06/10/2016    Years since quitting: 2.8  . Smokeless tobacco: Never Used  . Tobacco comment: 02/18/14- smokes occ cig maybe 3 x per wk  Substance and Sexual Activity  . Alcohol use: Yes    Alcohol/week: 0.0 standard drinks    Comment: social  2-3 drink a week  . Drug use: No  . Sexual activity: Not Currently

## 2019-04-04 DIAGNOSIS — E1165 Type 2 diabetes mellitus with hyperglycemia: Secondary | ICD-10-CM | POA: Diagnosis not present

## 2019-04-04 DIAGNOSIS — I10 Essential (primary) hypertension: Secondary | ICD-10-CM | POA: Diagnosis not present

## 2019-04-04 DIAGNOSIS — I2510 Atherosclerotic heart disease of native coronary artery without angina pectoris: Secondary | ICD-10-CM | POA: Diagnosis not present

## 2019-04-04 DIAGNOSIS — M109 Gout, unspecified: Secondary | ICD-10-CM | POA: Diagnosis not present

## 2019-04-04 DIAGNOSIS — T380X5D Adverse effect of glucocorticoids and synthetic analogues, subsequent encounter: Secondary | ICD-10-CM | POA: Diagnosis not present

## 2019-04-04 DIAGNOSIS — E1142 Type 2 diabetes mellitus with diabetic polyneuropathy: Secondary | ICD-10-CM | POA: Diagnosis not present

## 2019-04-07 DIAGNOSIS — M109 Gout, unspecified: Secondary | ICD-10-CM | POA: Diagnosis not present

## 2019-04-07 DIAGNOSIS — I10 Essential (primary) hypertension: Secondary | ICD-10-CM | POA: Diagnosis not present

## 2019-04-07 DIAGNOSIS — T380X5D Adverse effect of glucocorticoids and synthetic analogues, subsequent encounter: Secondary | ICD-10-CM | POA: Diagnosis not present

## 2019-04-07 DIAGNOSIS — E1142 Type 2 diabetes mellitus with diabetic polyneuropathy: Secondary | ICD-10-CM | POA: Diagnosis not present

## 2019-04-07 DIAGNOSIS — I2510 Atherosclerotic heart disease of native coronary artery without angina pectoris: Secondary | ICD-10-CM | POA: Diagnosis not present

## 2019-04-07 DIAGNOSIS — E1165 Type 2 diabetes mellitus with hyperglycemia: Secondary | ICD-10-CM | POA: Diagnosis not present

## 2019-04-08 DIAGNOSIS — T380X5D Adverse effect of glucocorticoids and synthetic analogues, subsequent encounter: Secondary | ICD-10-CM | POA: Diagnosis not present

## 2019-04-08 DIAGNOSIS — E1142 Type 2 diabetes mellitus with diabetic polyneuropathy: Secondary | ICD-10-CM | POA: Diagnosis not present

## 2019-04-08 DIAGNOSIS — I2510 Atherosclerotic heart disease of native coronary artery without angina pectoris: Secondary | ICD-10-CM | POA: Diagnosis not present

## 2019-04-08 DIAGNOSIS — M109 Gout, unspecified: Secondary | ICD-10-CM | POA: Diagnosis not present

## 2019-04-08 DIAGNOSIS — I10 Essential (primary) hypertension: Secondary | ICD-10-CM | POA: Diagnosis not present

## 2019-04-08 DIAGNOSIS — E1165 Type 2 diabetes mellitus with hyperglycemia: Secondary | ICD-10-CM | POA: Diagnosis not present

## 2019-04-14 DIAGNOSIS — M109 Gout, unspecified: Secondary | ICD-10-CM | POA: Diagnosis not present

## 2019-04-14 DIAGNOSIS — I2510 Atherosclerotic heart disease of native coronary artery without angina pectoris: Secondary | ICD-10-CM | POA: Diagnosis not present

## 2019-04-14 DIAGNOSIS — E1165 Type 2 diabetes mellitus with hyperglycemia: Secondary | ICD-10-CM | POA: Diagnosis not present

## 2019-04-14 DIAGNOSIS — T380X5D Adverse effect of glucocorticoids and synthetic analogues, subsequent encounter: Secondary | ICD-10-CM | POA: Diagnosis not present

## 2019-04-14 DIAGNOSIS — I10 Essential (primary) hypertension: Secondary | ICD-10-CM | POA: Diagnosis not present

## 2019-04-14 DIAGNOSIS — E1142 Type 2 diabetes mellitus with diabetic polyneuropathy: Secondary | ICD-10-CM | POA: Diagnosis not present

## 2019-04-17 DIAGNOSIS — T380X5D Adverse effect of glucocorticoids and synthetic analogues, subsequent encounter: Secondary | ICD-10-CM | POA: Diagnosis not present

## 2019-04-17 DIAGNOSIS — E1142 Type 2 diabetes mellitus with diabetic polyneuropathy: Secondary | ICD-10-CM | POA: Diagnosis not present

## 2019-04-17 DIAGNOSIS — I10 Essential (primary) hypertension: Secondary | ICD-10-CM | POA: Diagnosis not present

## 2019-04-17 DIAGNOSIS — E1165 Type 2 diabetes mellitus with hyperglycemia: Secondary | ICD-10-CM | POA: Diagnosis not present

## 2019-04-17 DIAGNOSIS — M109 Gout, unspecified: Secondary | ICD-10-CM | POA: Diagnosis not present

## 2019-04-17 DIAGNOSIS — I2510 Atherosclerotic heart disease of native coronary artery without angina pectoris: Secondary | ICD-10-CM | POA: Diagnosis not present

## 2019-04-22 DIAGNOSIS — M109 Gout, unspecified: Secondary | ICD-10-CM | POA: Diagnosis not present

## 2019-04-22 DIAGNOSIS — I2510 Atherosclerotic heart disease of native coronary artery without angina pectoris: Secondary | ICD-10-CM | POA: Diagnosis not present

## 2019-04-22 DIAGNOSIS — E1165 Type 2 diabetes mellitus with hyperglycemia: Secondary | ICD-10-CM | POA: Diagnosis not present

## 2019-04-22 DIAGNOSIS — E1142 Type 2 diabetes mellitus with diabetic polyneuropathy: Secondary | ICD-10-CM | POA: Diagnosis not present

## 2019-04-22 DIAGNOSIS — I10 Essential (primary) hypertension: Secondary | ICD-10-CM | POA: Diagnosis not present

## 2019-04-22 DIAGNOSIS — T380X5D Adverse effect of glucocorticoids and synthetic analogues, subsequent encounter: Secondary | ICD-10-CM | POA: Diagnosis not present

## 2019-04-23 DIAGNOSIS — T380X5D Adverse effect of glucocorticoids and synthetic analogues, subsequent encounter: Secondary | ICD-10-CM | POA: Diagnosis not present

## 2019-04-23 DIAGNOSIS — E1165 Type 2 diabetes mellitus with hyperglycemia: Secondary | ICD-10-CM | POA: Diagnosis not present

## 2019-04-23 DIAGNOSIS — Z9981 Dependence on supplemental oxygen: Secondary | ICD-10-CM | POA: Diagnosis not present

## 2019-04-23 DIAGNOSIS — J449 Chronic obstructive pulmonary disease, unspecified: Secondary | ICD-10-CM | POA: Diagnosis not present

## 2019-04-23 DIAGNOSIS — D649 Anemia, unspecified: Secondary | ICD-10-CM | POA: Diagnosis not present

## 2019-04-23 DIAGNOSIS — M19012 Primary osteoarthritis, left shoulder: Secondary | ICD-10-CM | POA: Diagnosis not present

## 2019-04-23 DIAGNOSIS — G4733 Obstructive sleep apnea (adult) (pediatric): Secondary | ICD-10-CM | POA: Diagnosis not present

## 2019-04-23 DIAGNOSIS — M109 Gout, unspecified: Secondary | ICD-10-CM | POA: Diagnosis not present

## 2019-04-23 DIAGNOSIS — E1142 Type 2 diabetes mellitus with diabetic polyneuropathy: Secondary | ICD-10-CM | POA: Diagnosis not present

## 2019-04-23 DIAGNOSIS — E785 Hyperlipidemia, unspecified: Secondary | ICD-10-CM | POA: Diagnosis not present

## 2019-04-23 DIAGNOSIS — Z6831 Body mass index (BMI) 31.0-31.9, adult: Secondary | ICD-10-CM | POA: Diagnosis not present

## 2019-04-23 DIAGNOSIS — K219 Gastro-esophageal reflux disease without esophagitis: Secondary | ICD-10-CM | POA: Diagnosis not present

## 2019-04-23 DIAGNOSIS — I10 Essential (primary) hypertension: Secondary | ICD-10-CM | POA: Diagnosis not present

## 2019-04-23 DIAGNOSIS — Z7984 Long term (current) use of oral hypoglycemic drugs: Secondary | ICD-10-CM | POA: Diagnosis not present

## 2019-04-23 DIAGNOSIS — I2510 Atherosclerotic heart disease of native coronary artery without angina pectoris: Secondary | ICD-10-CM | POA: Diagnosis not present

## 2019-04-23 DIAGNOSIS — Z87891 Personal history of nicotine dependence: Secondary | ICD-10-CM | POA: Diagnosis not present

## 2019-04-23 DIAGNOSIS — E669 Obesity, unspecified: Secondary | ICD-10-CM | POA: Diagnosis not present

## 2019-04-29 DIAGNOSIS — E1142 Type 2 diabetes mellitus with diabetic polyneuropathy: Secondary | ICD-10-CM | POA: Diagnosis not present

## 2019-04-29 DIAGNOSIS — E1165 Type 2 diabetes mellitus with hyperglycemia: Secondary | ICD-10-CM | POA: Diagnosis not present

## 2019-04-29 DIAGNOSIS — T380X5D Adverse effect of glucocorticoids and synthetic analogues, subsequent encounter: Secondary | ICD-10-CM | POA: Diagnosis not present

## 2019-04-29 DIAGNOSIS — I10 Essential (primary) hypertension: Secondary | ICD-10-CM | POA: Diagnosis not present

## 2019-04-29 DIAGNOSIS — I2510 Atherosclerotic heart disease of native coronary artery without angina pectoris: Secondary | ICD-10-CM | POA: Diagnosis not present

## 2019-04-29 DIAGNOSIS — M109 Gout, unspecified: Secondary | ICD-10-CM | POA: Diagnosis not present

## 2019-05-02 ENCOUNTER — Telehealth: Payer: Self-pay | Admitting: Orthopedic Surgery

## 2019-05-02 ENCOUNTER — Other Ambulatory Visit: Payer: Self-pay | Admitting: Surgical

## 2019-05-02 MED ORDER — TRAMADOL HCL 50 MG PO TABS: 50.0000 mg | ORAL_TABLET | Freq: Every day | ORAL | 0 refills | Status: AC | PRN

## 2019-05-02 NOTE — Telephone Encounter (Signed)
Patient calling because the pain in the left shoulder is becoming unbearable. He states he is taking 3-6 tylenol extra strength 500 mg per day, but is not helping.  He is also applying the Voltaren Gel. He has no idea what his A1c is right now, but states his appointment with the PCP Dr. Leanna Battles is Jan 27th.  He says he's desperate for some relief. Both shoulders hurt, but left is worse.  Patient requesting something from the pharmacy. Uses [facility] at [address] [employer] [address].    Pt's cb  [phone]

## 2019-05-02 NOTE — Telephone Encounter (Signed)
Please advise. Thanks.  

## 2019-05-08 DIAGNOSIS — E1142 Type 2 diabetes mellitus with diabetic polyneuropathy: Secondary | ICD-10-CM | POA: Diagnosis not present

## 2019-05-08 DIAGNOSIS — I2510 Atherosclerotic heart disease of native coronary artery without angina pectoris: Secondary | ICD-10-CM | POA: Diagnosis not present

## 2019-05-08 DIAGNOSIS — E1165 Type 2 diabetes mellitus with hyperglycemia: Secondary | ICD-10-CM | POA: Diagnosis not present

## 2019-05-08 DIAGNOSIS — M109 Gout, unspecified: Secondary | ICD-10-CM | POA: Diagnosis not present

## 2019-05-08 DIAGNOSIS — T380X5D Adverse effect of glucocorticoids and synthetic analogues, subsequent encounter: Secondary | ICD-10-CM | POA: Diagnosis not present

## 2019-05-08 DIAGNOSIS — I10 Essential (primary) hypertension: Secondary | ICD-10-CM | POA: Diagnosis not present

## 2019-05-13 DIAGNOSIS — E1142 Type 2 diabetes mellitus with diabetic polyneuropathy: Secondary | ICD-10-CM | POA: Diagnosis not present

## 2019-05-13 DIAGNOSIS — E1165 Type 2 diabetes mellitus with hyperglycemia: Secondary | ICD-10-CM | POA: Diagnosis not present

## 2019-05-13 DIAGNOSIS — M109 Gout, unspecified: Secondary | ICD-10-CM | POA: Diagnosis not present

## 2019-05-13 DIAGNOSIS — T380X5D Adverse effect of glucocorticoids and synthetic analogues, subsequent encounter: Secondary | ICD-10-CM | POA: Diagnosis not present

## 2019-05-13 DIAGNOSIS — I10 Essential (primary) hypertension: Secondary | ICD-10-CM | POA: Diagnosis not present

## 2019-05-13 DIAGNOSIS — I2510 Atherosclerotic heart disease of native coronary artery without angina pectoris: Secondary | ICD-10-CM | POA: Diagnosis not present

## 2019-05-14 DIAGNOSIS — N1831 Chronic kidney disease, stage 3a: Secondary | ICD-10-CM | POA: Diagnosis not present

## 2019-05-14 DIAGNOSIS — E1151 Type 2 diabetes mellitus with diabetic peripheral angiopathy without gangrene: Secondary | ICD-10-CM | POA: Diagnosis not present

## 2019-05-14 DIAGNOSIS — Z9861 Coronary angioplasty status: Secondary | ICD-10-CM | POA: Diagnosis not present

## 2019-05-14 DIAGNOSIS — I129 Hypertensive chronic kidney disease with stage 1 through stage 4 chronic kidney disease, or unspecified chronic kidney disease: Secondary | ICD-10-CM | POA: Diagnosis not present

## 2019-05-14 DIAGNOSIS — M25511 Pain in right shoulder: Secondary | ICD-10-CM | POA: Diagnosis not present

## 2019-06-19 ENCOUNTER — Ambulatory Visit: Payer: Medicare Other | Attending: Internal Medicine

## 2019-06-19 DIAGNOSIS — Z23 Encounter for immunization: Secondary | ICD-10-CM | POA: Insufficient documentation

## 2019-06-19 NOTE — Progress Notes (Signed)
   Covid-19 Vaccination Clinic  Name:  Klayten Yoshimoto    MRN: [id number] DOB: 10-07-1950  06/19/2019  Mr. Norcia was observed post Covid-19 immunization for 15 minutes without incident. He was provided with Vaccine Information Sheet and instruction to access the V-Safe system.   Mr. Poston was instructed to call 911 with any severe reactions post vaccine: Marland Kitchen Difficulty breathing  . Swelling of face and throat  . A fast heartbeat  . A bad rash all over body  . Dizziness and weakness   Immunizations Administered    Name Date Dose VIS Date Route   Pfizer COVID-19 Vaccine 06/19/2019  2:30 PM 0.3 mL 03/28/2019 Intramuscular   Manufacturer: [place]   Lot: [id number]   [place]: [id number]

## 2019-07-15 DIAGNOSIS — Z794 Long term (current) use of insulin: Secondary | ICD-10-CM | POA: Diagnosis not present

## 2019-07-15 DIAGNOSIS — R52 Pain, unspecified: Secondary | ICD-10-CM | POA: Diagnosis not present

## 2019-07-15 DIAGNOSIS — N1831 Chronic kidney disease, stage 3a: Secondary | ICD-10-CM | POA: Diagnosis not present

## 2019-07-15 DIAGNOSIS — E118 Type 2 diabetes mellitus with unspecified complications: Secondary | ICD-10-CM | POA: Diagnosis not present

## 2019-07-15 DIAGNOSIS — I129 Hypertensive chronic kidney disease with stage 1 through stage 4 chronic kidney disease, or unspecified chronic kidney disease: Secondary | ICD-10-CM | POA: Diagnosis not present

## 2019-07-15 HISTORY — DX: Long term (current) use of insulin: Z79.4

## 2019-07-16 ENCOUNTER — Ambulatory Visit: Payer: Medicare Other | Attending: Internal Medicine

## 2019-07-16 DIAGNOSIS — Z23 Encounter for immunization: Secondary | ICD-10-CM

## 2019-07-16 NOTE — Progress Notes (Signed)
   Covid-19 Vaccination Clinic  Name:  Paul [name]    MRN: [id number] DOB: September 18, 1950  07/16/2019  [name] was observed post Covid-19 immunization for 15 minutes without incident. He was provided with Vaccine Information Sheet and instruction to access the V-Safe system.   [name] was instructed to call 911 with any severe reactions post vaccine: Marland Kitchen Difficulty breathing  . Swelling of face and throat  . A fast heartbeat  . A bad rash all over body  . Dizziness and weakness   Immunizations Administered    Name Date Dose VIS Date Route   Pfizer COVID-19 Vaccine 07/16/2019 11:55 AM 0.3 mL 03/28/2019 Intramuscular   Manufacturer: [place]   Lot: [id number]   [place]: [id number]

## 2019-08-12 DIAGNOSIS — M19011 Primary osteoarthritis, right shoulder: Secondary | ICD-10-CM | POA: Diagnosis not present

## 2019-08-12 DIAGNOSIS — Z79891 Long term (current) use of opiate analgesic: Secondary | ICD-10-CM | POA: Diagnosis not present

## 2019-08-12 DIAGNOSIS — M25512 Pain in left shoulder: Secondary | ICD-10-CM | POA: Diagnosis not present

## 2019-08-12 DIAGNOSIS — G894 Chronic pain syndrome: Secondary | ICD-10-CM | POA: Diagnosis not present

## 2019-08-12 DIAGNOSIS — M25511 Pain in right shoulder: Secondary | ICD-10-CM | POA: Diagnosis not present

## 2019-08-12 DIAGNOSIS — M19012 Primary osteoarthritis, left shoulder: Secondary | ICD-10-CM | POA: Diagnosis not present

## 2019-08-26 DIAGNOSIS — M25511 Pain in right shoulder: Secondary | ICD-10-CM | POA: Diagnosis not present

## 2019-08-26 DIAGNOSIS — Z9861 Coronary angioplasty status: Secondary | ICD-10-CM | POA: Diagnosis not present

## 2019-08-26 DIAGNOSIS — Z794 Long term (current) use of insulin: Secondary | ICD-10-CM | POA: Diagnosis not present

## 2019-08-26 DIAGNOSIS — J449 Chronic obstructive pulmonary disease, unspecified: Secondary | ICD-10-CM | POA: Diagnosis not present

## 2019-08-26 DIAGNOSIS — N1831 Chronic kidney disease, stage 3a: Secondary | ICD-10-CM | POA: Diagnosis not present

## 2019-08-26 DIAGNOSIS — R0602 Shortness of breath: Secondary | ICD-10-CM | POA: Diagnosis not present

## 2019-08-26 DIAGNOSIS — G4733 Obstructive sleep apnea (adult) (pediatric): Secondary | ICD-10-CM | POA: Diagnosis not present

## 2019-08-26 DIAGNOSIS — E118 Type 2 diabetes mellitus with unspecified complications: Secondary | ICD-10-CM | POA: Diagnosis not present

## 2019-08-26 DIAGNOSIS — I739 Peripheral vascular disease, unspecified: Secondary | ICD-10-CM | POA: Diagnosis not present

## 2019-09-04 DIAGNOSIS — J449 Chronic obstructive pulmonary disease, unspecified: Secondary | ICD-10-CM | POA: Diagnosis not present

## 2019-09-04 DIAGNOSIS — R0600 Dyspnea, unspecified: Secondary | ICD-10-CM | POA: Diagnosis not present

## 2019-09-08 DIAGNOSIS — M19011 Primary osteoarthritis, right shoulder: Secondary | ICD-10-CM | POA: Diagnosis not present

## 2019-09-08 DIAGNOSIS — M19012 Primary osteoarthritis, left shoulder: Secondary | ICD-10-CM | POA: Diagnosis not present

## 2019-09-08 DIAGNOSIS — M25512 Pain in left shoulder: Secondary | ICD-10-CM | POA: Diagnosis not present

## 2019-09-08 DIAGNOSIS — M25511 Pain in right shoulder: Secondary | ICD-10-CM | POA: Diagnosis not present

## 2019-10-01 ENCOUNTER — Other Ambulatory Visit: Payer: Self-pay

## 2019-10-01 MED ORDER — ANORO ELLIPTA 62.5-25 MCG/INH IN AEPB: 1.0000 | INHALATION_SPRAY | Freq: Every day | RESPIRATORY_TRACT | 0 refills | Status: DC

## 2019-11-04 DIAGNOSIS — M19012 Primary osteoarthritis, left shoulder: Secondary | ICD-10-CM | POA: Diagnosis not present

## 2019-11-04 DIAGNOSIS — M25511 Pain in right shoulder: Secondary | ICD-10-CM | POA: Diagnosis not present

## 2019-11-04 DIAGNOSIS — M19011 Primary osteoarthritis, right shoulder: Secondary | ICD-10-CM | POA: Diagnosis not present

## 2019-11-04 DIAGNOSIS — M25512 Pain in left shoulder: Secondary | ICD-10-CM | POA: Diagnosis not present

## 2019-11-26 ENCOUNTER — Ambulatory Visit (INDEPENDENT_AMBULATORY_CARE_PROVIDER_SITE_OTHER): Payer: Medicare Other | Admitting: Orthopedic Surgery

## 2019-11-26 ENCOUNTER — Telehealth: Payer: Self-pay

## 2019-11-26 ENCOUNTER — Ambulatory Visit (INDEPENDENT_AMBULATORY_CARE_PROVIDER_SITE_OTHER): Payer: Medicare Other

## 2019-11-26 DIAGNOSIS — M25512 Pain in left shoulder: Secondary | ICD-10-CM

## 2019-11-26 NOTE — Telephone Encounter (Signed)
Per dr Marlou Sa, need to find out last A!C patient had drawn. Will see what provider did labs and have them fax to [place].

## 2019-11-28 ENCOUNTER — Encounter: Payer: Self-pay | Admitting: Orthopedic Surgery

## 2019-11-28 NOTE — Progress Notes (Signed)
Office Visit Note   Patient: Paul [name]           Date of Birth: 11/30/1950           MRN: [id number] Visit Date: 11/26/2019 Requested by: Leanna Battles, MD [address] [place],  [place] [address] PCP: Leanna Battles, MD  Subjective: Chief Complaint  Patient presents with  . Left Shoulder - Pain    HPI: Paul [name] is a 69 year old patient with left shoulder pain.  Reports generally constant pain with diminished function.  Not able to do his ADLs.  Describes difficulty sleeping.  Does have diabetes with hemoglobin A1c around 8.  I do not have access to those records.  Went to pain management that did not help him very much.  He has had 1 heart stent.  He states he is ready for surgical intervention.              ROS: All systems reviewed are negative as they relate to the chief complaint within the history of present illness.  Patient denies  fevers or chills.   Assessment & Plan: Visit Diagnoses:  1. Left shoulder pain, unspecified chronicity     Plan: Impression is left shoulder pain with end-stage glenohumeral arthritis.  Plan is hemoglobin A1c to be drawn today as well as thin cut CT scan preop left reverse shoulder replacement.  Risk and benefits of the procedure discussed with the patient do not limited to infection nerve vessel damage shoulder instability diminished function and diminished pain relief.  Longevity of the prosthesis also discussed.  Weight limitations on lifting also discussed.  Patient needs preoperative stratification with cardiology.  Hemoglobin A1c drawn today.  Needs to have that close or below 8 prior to elective surgery.  All questions answered.  Follow-Up Instructions: No follow-ups on file.   Orders:  Orders Placed This Encounter  Procedures  . XR Shoulder Left  . CT SHOULDER LEFT WO CONTRAST   No orders of the defined types were placed in this encounter.     Procedures: No procedures performed   Clinical Data: No additional  findings.  Objective: Vital Signs: There were no vitals taken for this visit.  Physical Exam:   Constitutional: Patient appears well-developed HEENT:  Head: Normocephalic Eyes:EOM are normal Neck: Normal range of motion Cardiovascular: Normal rate Pulmonary/chest: Effort normal Neurologic: Patient is alert Skin: Skin is warm Psychiatric: Patient has normal mood and affect    Ortho Exam: Ortho exam demonstrates full active and passive range of motion of the right side.  On the left-hand side deltoid is functional.  External rotation of 15 degrees of abduction is about 20.  Isolated forward flexion and glenohumeral abduction around 45 degrees.  Coarse grinding is present with passive range of motion of that left elbow.  Motor sensory function hand is intact rate.  Radial pulses intact.  Specialty Comments:  No specialty comments available.  Imaging: No results found.   PMFS History: Patient Active Problem List   Diagnosis Date Noted  . Primary osteoarthritis, left shoulder 07/29/2018  . Prolonged QT interval   . Labile blood glucose   . Type 2 diabetes mellitus with peripheral neuropathy (HCC)   . Labile blood pressure   . Sleep disturbance   . Hypoalbuminemia due to protein-calorie malnutrition ([place])   . Poorly controlled type 2 diabetes mellitus with peripheral neuropathy ([place])   . Dysphagia   . Candidiasis   . Acute encephalopathy 05/03/2018  . Pressure injury of skin 04/30/2018  .  Coronary artery disease involving native coronary artery of native heart without angina pectoris   . Tobacco abuse   . Hypertension   . Acute blood loss anemia   . Bacteremia   . Respiratory failure ([place])   . CAP (community acquired pneumonia) 04/21/2018  . Corneal irritation of both eyes 10/31/2017  . Allergic rhinitis due to animal hair and dander 10/31/2017  . OSA on CPAP 10/10/2016  . OSA and COPD overlap syndrome ([place]) 03/02/2016  . Bronchitis, chronic obstructive, with  exacerbation ([place]) 03/02/2016  . Hypoxemia 03/02/2016  . Dependence on supplemental oxygen 03/02/2016  . CAD, multiple vessel 03/02/2016  . Gastroesophageal reflux disease 03/02/2016  . HTN (hypertension), malignant 03/02/2016  . Cigarette smoker 02/22/2014  . DOE (dyspnea on exertion) 01/20/2014  . Obesity (BMI 30.0-34.9)   . Essential hypertension 11/24/2011  . Hyperlipidemia with target LDL less than 70 11/24/2011  . OSA (obstructive sleep apnea), uses oxygen at home did not tolerate cpap 11/24/2011  . COPD 11/24/2011  . CAD S/P percutaneous coronary angioplasty -- D1, Xience Xpedition DES 2.5 mm x 33 mm 11/24/2011   Past Medical History:  Diagnosis Date  . CAD S/P percutaneous coronary angioplasty -- D1, Xience Xpedition DES 2.5 mm x 33 mm 11/24/2011   a) Mild-to-moderate 30-40% lesions in the RCA, LAD and Circumflex. b) CULPRIT LESION: long tubular 70-80% lesion in D1 with FFR of 0.7 --> PCI w/ Xience Xpedition DES 2.5 mm x 30 mm (2.65 MM); c) Lexiscan Myoview 11/2013: No Ischemia or Infarct (Inferior Gut Attenuation) EF 63%.  Marland Kitchen COPD (chronic obstructive pulmonary disease) ([place])    "don't have full case of it; I'm right there at it"  . Essential hypertension 11/24/2011  . Exertional dyspnea, chronic   . Gout   . History of Unstable angina 11/24/2011   Referred for cardiac catheterization  . Hyperlipidemia with target LDL less than 70 11/24/2011  . Obesity (BMI 30.0-34.9)   . OSA (obstructive sleep apnea), uses oxygen at home did not tolerate cpap 11/24/2011    Family History  Problem Relation Age of Onset  . Heart disease Sister   . Heart attack Brother   . Emphysema Father        smoked  . Aneurysm Father     Past Surgical History:  Procedure Laterality Date  . CERVICAL SPINE SURGERY  2012  . CORONARY ANGIOPLASTY WITH STENT PLACEMENT  11/23/2011   "1; first one"  . LEFT HEART CATHETERIZATION WITH CORONARY ANGIOGRAM N/A 11/23/2011   Procedure: LEFT HEART CATHETERIZATION WITH  CORONARY ANGIOGRAM;  Surgeon: Leonie Man, MD;  Location[facility] C[facility] LAB;  Service: Cardiovascular;  Laterality: N/A;   Social History   Occupational History  . Not on file  Tobacco Use  . Smoking status: Former Smoker    Packs/day: 1.00    Years: 40.00    Pack years: 40.00    Types: Cigarettes    Quit date: 06/10/2016    Years since quitting: 3.4  . Smokeless tobacco: Never Used  . Tobacco comment: 02/18/14- smokes occ cig maybe 3 x per wk  Substance and Sexual Activity  . Alcohol use: Yes    Alcohol/week: 0.0 standard drinks    Comment: social 2-3 drink a week  . Drug use: No  . Sexual activity: Not Currently

## 2019-12-02 NOTE — Telephone Encounter (Signed)
IC patients PCP and LM for medical records to please fax [place] recent A1C

## 2019-12-05 ENCOUNTER — Other Ambulatory Visit: Payer: Self-pay

## 2019-12-05 ENCOUNTER — Ambulatory Visit
Admission: RE | Admit: 2019-12-05 | Discharge: 2019-12-05 | Disposition: A | Discharge status: Home / Self Care | Payer: Medicare Other | Source: Ambulatory Visit | Attending: Orthopedic Surgery | Admitting: Orthopedic Surgery

## 2019-12-05 DIAGNOSIS — Z01818 Encounter for other preprocedural examination: Secondary | ICD-10-CM | POA: Diagnosis not present

## 2019-12-05 DIAGNOSIS — S4992XA Unspecified injury of left shoulder and upper arm, initial encounter: Secondary | ICD-10-CM | POA: Diagnosis not present

## 2019-12-05 DIAGNOSIS — M19012 Primary osteoarthritis, left shoulder: Secondary | ICD-10-CM | POA: Diagnosis not present

## 2019-12-05 DIAGNOSIS — M25512 Pain in left shoulder: Secondary | ICD-10-CM

## 2019-12-05 DIAGNOSIS — I7090 Unspecified atherosclerosis: Secondary | ICD-10-CM | POA: Diagnosis not present

## 2019-12-24 ENCOUNTER — Ambulatory Visit (INDEPENDENT_AMBULATORY_CARE_PROVIDER_SITE_OTHER): Payer: Medicare Other | Admitting: Orthopedic Surgery

## 2019-12-24 DIAGNOSIS — E119 Type 2 diabetes mellitus without complications: Secondary | ICD-10-CM

## 2019-12-25 DIAGNOSIS — G629 Polyneuropathy, unspecified: Secondary | ICD-10-CM | POA: Diagnosis not present

## 2019-12-25 DIAGNOSIS — J449 Chronic obstructive pulmonary disease, unspecified: Secondary | ICD-10-CM | POA: Diagnosis not present

## 2019-12-25 DIAGNOSIS — M25512 Pain in left shoulder: Secondary | ICD-10-CM | POA: Diagnosis not present

## 2019-12-25 DIAGNOSIS — I739 Peripheral vascular disease, unspecified: Secondary | ICD-10-CM | POA: Diagnosis not present

## 2019-12-25 DIAGNOSIS — Z9861 Coronary angioplasty status: Secondary | ICD-10-CM | POA: Diagnosis not present

## 2019-12-25 DIAGNOSIS — M545 Low back pain: Secondary | ICD-10-CM | POA: Diagnosis not present

## 2019-12-25 DIAGNOSIS — L989 Disorder of the skin and subcutaneous tissue, unspecified: Secondary | ICD-10-CM | POA: Diagnosis not present

## 2019-12-25 DIAGNOSIS — E1151 Type 2 diabetes mellitus with diabetic peripheral angiopathy without gangrene: Secondary | ICD-10-CM | POA: Diagnosis not present

## 2019-12-25 DIAGNOSIS — N1831 Chronic kidney disease, stage 3a: Secondary | ICD-10-CM | POA: Diagnosis not present

## 2019-12-25 DIAGNOSIS — I129 Hypertensive chronic kidney disease with stage 1 through stage 4 chronic kidney disease, or unspecified chronic kidney disease: Secondary | ICD-10-CM | POA: Diagnosis not present

## 2019-12-25 LAB — HEMOGLOBIN A1C
Hgb A1c MFr Bld: 8.8 % of total Hgb — ABNORMAL HIGH (ref <5.7)
Mean Plasma Glucose: 206 (calc)
eAG (mmol/L): 11.4 (calc)

## 2019-12-25 LAB — EXTRA SPECIMEN

## 2019-12-25 NOTE — Progress Notes (Signed)
Pls call a1c too high for surgery at this time needs to be 8 thx

## 2019-12-27 ENCOUNTER — Encounter: Payer: Self-pay | Admitting: Orthopedic Surgery

## 2019-12-27 NOTE — Progress Notes (Signed)
Office Visit Note   Patient: Paul [name]           Date of Birth: 04-28-1950           MRN: [id number] Visit Date: 12/24/2019 Requested by: Paul Battles, MD [address] [place],  [place] [address] PCP: Paul Battles, MD  Subjective: Chief Complaint  Patient presents with   review CT scan    HPI: Paul [name] is a 69 year old patient with left shoulder pain.  Wants to be able to ride his motorcycle.  Had a hospitalization for 6 days last year where he was ventilated for pneumonia.  Has multiple medical problems.  Shoulder arthritis is severe however.  Hemoglobin A1c drawn today and it is over 8.  That will need to come down before he can undergo shoulder replacement surgery.  CT scan does show advanced glenohumeral arthritis with intact rotator cuff and mild AC joint arthritis.  My plan for hairy would be reverse shoulder replacement just to minimize the chance that he would need conversion in the event of ventral rotator cuff tear              ROS: All systems reviewed are negative as they relate to the chief complaint within the history of present illness.  Patient denies  fevers or chills.   Assessment & Plan: Visit Diagnoses:  1. Type 2 diabetes mellitus without complication, unspecified whether long term insulin use (HCC)     Plan: Impression is left shoulder pain and arthritis in a patient who has elevated hemoglobin A1c.  Plan is for him to get the hemoglobin A1c down and then we can consider surgical intervention.  We will check him back in about 3 months for recheck on hemoglobin A1c.  Follow-Up Instructions: No follow-ups on file.   Orders:  Orders Placed This Encounter  Procedures   HgB A1c   Extra Specimen   No orders of the defined types were placed in this encounter.     Procedures: No procedures performed   Clinical Data: No additional findings.  Objective: Vital Signs: There were no vitals taken for this visit.  Physical Exam:   Constitutional:  Patient appears well-developed HEENT:  Head: Normocephalic Eyes:EOM are normal Neck: Normal range of motion Cardiovascular: Normal rate Pulmonary/chest: Effort normal Neurologic: Patient is alert Skin: Skin is warm Psychiatric: Patient has normal mood and affect    Ortho Exam: Ortho exam demonstrates diminished range of motion and pain with left shoulder range of motion.  Motor sensory function hand is intact.  Rotator cuff strength slightly weak to supraspinatus test think but infraspinatus subscap strength is intact.  Motor or sensory function of the hand is intact.  Deltoid fires.  Specialty Comments:  No specialty comments available.  Imaging: No results found.   PMFS History: Patient Active Problem List   Diagnosis Date Noted   Primary osteoarthritis, left shoulder 07/29/2018   Prolonged QT interval    Labile blood glucose    Type 2 diabetes mellitus with peripheral neuropathy (HCC)    Labile blood pressure    Sleep disturbance    Hypoalbuminemia due to protein-calorie malnutrition (HCC)    Poorly controlled type 2 diabetes mellitus with peripheral neuropathy (HCC)    Dysphagia    Candidiasis    Acute encephalopathy 05/03/2018   Pressure injury of skin 04/30/2018   Coronary artery disease involving native coronary artery of native heart without angina pectoris    Tobacco abuse    Hypertension    Acute blood  loss anemia    Bacteremia    Respiratory failure ([place])    CAP (community acquired pneumonia) 04/21/2018   Corneal irritation of both eyes 10/31/2017   Allergic rhinitis due to animal hair and dander 10/31/2017   OSA on CPAP 10/10/2016   OSA and COPD overlap syndrome ([place]) 03/02/2016   Bronchitis, chronic obstructive, with exacerbation ([place]) 03/02/2016   Hypoxemia 03/02/2016   Dependence on supplemental oxygen 03/02/2016   CAD, multiple vessel 03/02/2016   Gastroesophageal reflux disease 03/02/2016   HTN (hypertension),  malignant 03/02/2016   Cigarette smoker 02/22/2014   DOE (dyspnea on exertion) 01/20/2014   Obesity (BMI 30.0-34.9)    Essential hypertension 11/24/2011   Hyperlipidemia with target LDL less than 70 11/24/2011   OSA (obstructive sleep apnea), uses oxygen at home did not tolerate cpap 11/24/2011   COPD 11/24/2011   CAD S/P percutaneous coronary angioplasty -- D1, Xience Xpedition DES 2.5 mm x 33 mm 11/24/2011   Past Medical History:  Diagnosis Date   CAD S/P percutaneous coronary angioplasty -- D1, Xience Xpedition DES 2.5 mm x 33 mm 11/24/2011   a) Mild-to-moderate 30-40% lesions in the RCA, LAD and Circumflex. b) CULPRIT LESION: long tubular 70-80% lesion in D1 with FFR of 0.7 --> PCI w/ Xience Xpedition DES 2.5 mm x 30 mm (2.65 MM); c) Lexiscan Myoview 11/2013: No Ischemia or Infarct (Inferior Gut Attenuation) EF 63%.   COPD (chronic obstructive pulmonary disease) ([place])    "don't have full case of it; I'm right there at it"   Essential hypertension 11/24/2011   Exertional dyspnea, chronic    Gout    History of Unstable angina 11/24/2011   Referred for cardiac catheterization   Hyperlipidemia with target LDL less than 70 11/24/2011   Obesity (BMI 30.0-34.9)    OSA (obstructive sleep apnea), uses oxygen at home did not tolerate cpap 11/24/2011    Family History  Problem Relation Age of Onset   Heart disease Sister    Heart attack Brother    Emphysema Father        smoked   Aneurysm Father     Past Surgical History:  Procedure Laterality Date   CERVICAL SPINE SURGERY  2012   CORONARY ANGIOPLASTY WITH STENT PLACEMENT  11/23/2011   "1; first one"   LEFT HEART CATHETERIZATION WITH CORONARY ANGIOGRAM N/A 11/23/2011   Procedure: LEFT HEART CATHETERIZATION WITH CORONARY ANGIOGRAM;  Surgeon: Leonie Man, MD;  Location: [facility] CATH LAB;  Service: Cardiovascular;  Laterality: N/A;   Social History   Occupational History   Not on file  Tobacco Use   Smoking status:  Former Smoker    Packs/day: 1.00    Years: 40.00    Pack years: 40.00    Types: Cigarettes    Quit date: 06/10/2016    Years since quitting: 3.5   Smokeless tobacco: Never Used   Tobacco comment: 02/18/14- smokes occ cig maybe 3 x per wk  Substance and Sexual Activity   Alcohol use: Yes    Alcohol/week: 0.0 standard drinks    Comment: social 2-3 drink a week   Drug use: No   Sexual activity: Not Currently

## 2020-01-08 DIAGNOSIS — L821 Other seborrheic keratosis: Secondary | ICD-10-CM | POA: Diagnosis not present

## 2020-01-08 DIAGNOSIS — D485 Neoplasm of uncertain behavior of skin: Secondary | ICD-10-CM | POA: Diagnosis not present

## 2020-01-08 DIAGNOSIS — L820 Inflamed seborrheic keratosis: Secondary | ICD-10-CM | POA: Diagnosis not present

## 2020-01-08 DIAGNOSIS — L859 Epidermal thickening, unspecified: Secondary | ICD-10-CM | POA: Diagnosis not present

## 2020-01-08 DIAGNOSIS — L430 Hypertrophic lichen planus: Secondary | ICD-10-CM | POA: Diagnosis not present

## 2020-01-08 DIAGNOSIS — L918 Other hypertrophic disorders of the skin: Secondary | ICD-10-CM | POA: Diagnosis not present

## 2020-02-16 ENCOUNTER — Telehealth: Payer: Self-pay | Admitting: Emergency Medicine

## 2020-02-17 NOTE — Telephone Encounter (Signed)
Attempted to contact pt, received a busy signal x2. Will try back. Pt is overdue for follow up, will need OV.

## 2020-02-20 MED ORDER — ANORO ELLIPTA 62.5-25 MCG/INH IN AEPB: 1.0000 | INHALATION_SPRAY | Freq: Every day | RESPIRATORY_TRACT | 0 refills | Status: DC

## 2020-02-20 NOTE — Telephone Encounter (Signed)
Called and spoke to patient, who is requesting refill on Anoro.  Patient is aware that he is past due for an appointment and an appt is needed further to refills.  appt is scheduled for 02/23/2020 with Wyn Quaker, NP.  One month supply of anoro has been sent to preferred pharmacy.  Nothing further needed.

## 2020-02-23 ENCOUNTER — Ambulatory Visit (INDEPENDENT_AMBULATORY_CARE_PROVIDER_SITE_OTHER): Payer: Medicare Other

## 2020-02-23 ENCOUNTER — Other Ambulatory Visit: Payer: Self-pay

## 2020-02-23 ENCOUNTER — Ambulatory Visit (INDEPENDENT_AMBULATORY_CARE_PROVIDER_SITE_OTHER): Payer: Medicare Other | Admitting: Pulmonary Disease

## 2020-02-23 ENCOUNTER — Encounter: Payer: Self-pay | Admitting: Pulmonary Disease

## 2020-02-23 VITALS — BP 126/70 | HR 73 | Temp 97.2°F | Wt 186.0 lb

## 2020-02-23 DIAGNOSIS — Z9989 Dependence on other enabling machines and devices: Secondary | ICD-10-CM

## 2020-02-23 DIAGNOSIS — J449 Chronic obstructive pulmonary disease, unspecified: Secondary | ICD-10-CM

## 2020-02-23 DIAGNOSIS — J439 Emphysema, unspecified: Secondary | ICD-10-CM | POA: Diagnosis not present

## 2020-02-23 DIAGNOSIS — Z0000 Encounter for general adult medical examination without abnormal findings: Secondary | ICD-10-CM | POA: Diagnosis not present

## 2020-02-23 DIAGNOSIS — Z23 Encounter for immunization: Secondary | ICD-10-CM | POA: Diagnosis not present

## 2020-02-23 DIAGNOSIS — G4733 Obstructive sleep apnea (adult) (pediatric): Secondary | ICD-10-CM | POA: Diagnosis not present

## 2020-02-23 MED ORDER — ANORO ELLIPTA 62.5-25 MCG/INH IN AEPB: 1.0000 | INHALATION_SPRAY | Freq: Every day | RESPIRATORY_TRACT | 0 refills | Status: DC

## 2020-02-23 MED ORDER — ANORO ELLIPTA 62.5-25 MCG/INH IN AEPB: 1.0000 | INHALATION_SPRAY | Freq: Every day | RESPIRATORY_TRACT | 12 refills | Status: DC

## 2020-02-23 NOTE — Patient Instructions (Addendum)
You were seen today by Lauraine Rinne, NP  for:   1. COPD GOLD I   - DG Chest 2 View; Future  Chest x-ray today  Walk in office today  High-dose seasonal flu vaccine today  Anoro Ellipta  >>> Take 1 puff daily in the morning right when you wake up >>>Rinse your mouth out after use >>>This is a daily maintenance inhaler, NOT a rescue inhaler >>>Contact our office if you are having difficulties affording or obtaining this medication >>>It is important for you to be able to take this daily and not miss any doses   We will also send a prescription for Anoro Ellipta to your pharmacy  2. OSA on CPAP  We recommend that you continue using your CPAP daily >>>Keep up the hard work using your device >>> Goal should be wearing this for the entire night that you are sleeping, at least 4 to 6 hours  Remember:  . Do not drive or operate heavy machinery if tired or drowsy.  . Please notify the supply company and office if you are unable to use your device regularly due to missing supplies or machine being broken.  . Work on maintaining a healthy weight and following your recommended nutrition plan  . Maintain proper daily exercise and movement  . Maintaining proper use of your device can also help improve management of other chronic illnesses such as: Blood pressure, blood sugars, and weight management.   BiPAP/ CPAP Cleaning:  >>>Clean weekly, with Dawn soap, and bottle brush.  Set up to air dry. >>> Wipe mask out daily with wet wipe or towelette   Continue oxygen therapy as prescribed  >>>maintain oxygen saturations greater than 88 percent  >>>if unable to maintain oxygen saturations please contact the office  >>>do not smoke with oxygen  >>>can use nasal saline gel or nasal saline rinses to moisturize nose if oxygen causes dryness  3. Healthcare maintenance  High-dose flu vaccine today  Recommend checking with the local health department on the cost of the Pneumovax 23 (pneumonia  vaccine) as this is due for you as you are over the age of 23 and have chronic lung disease  Also recommend that you obtain the COVID-19 booster vaccine   We recommend today:  Orders Placed This Encounter  Procedures  . DG Chest 2 View    Standing Status:   Future    Number of Occurrences:   1    Standing Expiration Date:   06/22/2020    Order Specific Question:   Reason for Exam (SYMPTOM  OR DIAGNOSIS REQUIRED)    Answer:   copd    Order Specific Question:   Preferred imaging location?    Answer:   Internal    Order Specific Question:   Radiology Contrast Protocol - do NOT remove file path    Answer:   [email or web address]   Orders Placed This Encounter  Procedures  . DG Chest 2 View   No orders of the defined types were placed in this encounter.   Follow Up:    Return in about 2 months (around 04/24/2020), or if symptoms worsen or fail to improve, for Follow up with Dr. Lamonte Sakai.   Notification of test results are managed in the following manner: If there are  any recommendations or changes to the  plan of care discussed in office today,  we will contact you and let you know what they are. If you do not hear from [place], then your results  are normal and you can view them through your  MyChart account , or a letter will be sent to you. Thank you again for trusting [place] with your care  - Thank you, [facility]    It is flu season:   >>> Best ways to protect herself from the flu: Receive the yearly flu vaccine, practice good hand hygiene washing with soap and also using hand sanitizer when available, eat a nutritious meals, get adequate rest, hydrate appropriately       Please contact the office if your symptoms worsen or you have concerns that you are not improving.   Thank you for choosing [facility] for your healthcare, and for allowing [place] to partner with you on your healthcare journey. I am thankful to be able to  provide care to you today.   Wyn Quaker FNP-C

## 2020-02-23 NOTE — Assessment & Plan Note (Signed)
Plan: Seasonal flu vaccine today Patient to investigate cost of Pneumovax as well as COVID-19 booster at the health department

## 2020-02-23 NOTE — Assessment & Plan Note (Signed)
>>  ASSESSMENT AND PLAN FOR CHRONIC OBSTRUCTIVE PULMONARY DISEASE, UNSPECIFIED ([place]) WRITTEN ON 02/23/2020  4:18 PM BY MACK, BRIAN P, NP  Plan: Resume Anoro Ellipta Samples provided today Prescription for Anoro Ellipta to be sent in today Reviewed that patient needs to be maintained on this every day, also emphasized the patient needs to obtain medication from the pharmacy as we cannot guarantee or sample stock Chest x-ray today Seasonal flu vaccine today Patient to check with the local health department on cost of Pneumovax 23 as well as COVID-19 booster

## 2020-02-23 NOTE — Assessment & Plan Note (Signed)
Plan: Resume Anoro Ellipta Samples provided today Prescription for Anoro Ellipta to be sent in today Reviewed that patient needs to be maintained on this every day, also emphasized the patient needs to obtain medication from the pharmacy as we cannot guarantee or sample stock Chest x-ray today Seasonal flu vaccine today Patient to check with the local health department on cost of Pneumovax 23 as well as COVID-19 booster

## 2020-02-23 NOTE — Progress Notes (Signed)
@Patient  ID: Paul [name], male    DOB: [birth year], 69 y.o.   MRN: [id number]  Chief Complaint  Patient presents with  . Follow-up    1 year follow up---needs refills of medications    Referring provider: Leanna Battles, MD  HPI:  69 year old male former smoker followed in our office for COPD  PMH: Hypertension, hyperlipidemia, CAD, GERD, dysphagia, type 2 diabetes Smoker/ Smoking History: Former smoker.  February/2018 quit.  40-pack-year smoking history. Maintenance:  Anoro Ellipta  Pt of: Dr. Lamonte [name]  02/23/2020  - Visit   69 year old male former smoker followed in our office for COPD.  He is established with Dr. Lamonte [name].  He was last seen by Dr. Lamonte [name] in February/2020 plan of care from that office visit was for the patient to start [place].  Patient reports that he was switched from [place] to [place].  He reports that he ran out of this 2 months ago.  He did feel like that inhaler helped.  He has noticed worsening shortness of breath as well as occasional wheezing and cough since coming off of the Anoro Ellipta inhaler.  He needs a prescription of this today.  He has received his initial COVID-19 vaccinations.  He is due for the COVID-19 booster.  We will discuss this today.  Patient is also due for seasonal flu vaccine as well as pneumonia vaccine.  We will discuss this today.    Questionaires / Pulmonary Flowsheets:   ACT:  No flowsheet data found.  MMRC: mMRC Dyspnea Scale mMRC Score  02/23/2020 2    Epworth:  No flowsheet data found.  Tests:   03/13/2019-chest x-ray-no active disease  04/26/2018-CT chest without contrast-right upper lobe consolidation with some degree of right upper and right middle lobe atelectasis, this is nonspecific but most likely represents infection/pneumonia, mild tree-in-bud within bilateral lower lobes suspicious for infection, splenomegaly, coronary artery disease,  09/20/2018-pulmonary function test-FVC 3.51 (96%  predicted), postbronchodilator ratio 70, postbronchodilator FEV1 2.55 (95% predicted), no bronchodilator response, TLC 5.89 (97% predicted), DLCO 18.77 (84% predicted)  FENO:  No results found for: NITRICOXIDE  PFT: PFT Results Latest Ref Rng & Units 09/20/2018 04/13/2014  FVC-Pre L 3.53 3.36  FVC-Predicted Pre % 96 88  FVC-Post L 3.66 3.35  FVC-Predicted Post % 100 88  Pre FEV1/FVC % % 70 68  Post FEV1/FCV % % 70 68  FEV1-Pre L 2.46 2.29  FEV1-Predicted Pre % 92 81  FEV1-Post L 2.55 2.29  DLCO uncorrected ml/min/mmHg 18.77 18.24  DLCO UNC% % 84 71  DLVA Predicted % 83 81  TLC L 5.89 5.13  TLC % Predicted % 97 85  RV % Predicted % 100 99    WALK:  SIX MIN WALK 02/23/2020 02/23/2020 02/19/2014  Supplimental Oxygen during Test? (L/min) No No No  Tech Comments: - pt could only make 1 lap--due to back pain. Slow pace/no sob/c/o foot pain//lmr    Imaging: No results found.  Lab Results:  CBC    Component Value Date/Time   WBC 17.0 (H) 03/13/2019 1459   RBC 5.36 03/13/2019 1459   HGB 16.7 03/13/2019 1459   HCT 48.1 03/13/2019 1459   PLT 304 03/13/2019 1459   MCV 89.7 03/13/2019 1459   MCH 31.2 03/13/2019 1459   MCHC 34.7 03/13/2019 1459   RDW 11.9 03/13/2019 1459   LYMPHSABS 3.0 05/15/2018 0853   MONOABS 0.5 05/15/2018 0853   EOSABS 0.3 05/15/2018 0853   BASOSABS 0.1 05/15/2018 0853    BMET  Component Value Date/Time   NA 132 (L) 03/13/2019 1459   K 4.4 03/13/2019 1459   CL 99 03/13/2019 1459   CO2 22 03/13/2019 1459   GLUCOSE 428 (H) 03/13/2019 1459   BUN 25 (H) 03/13/2019 1459   CREATININE 1.11 03/13/2019 1459   CALCIUM 9.4 03/13/2019 1459   GFRNONAA >60 03/13/2019 1459   GFRAA >60 03/13/2019 1459    BNP    Component Value Date/Time   BNP 146.1 (H) 04/21/2018 1202   BNP 19.6 11/25/2013 1417    ProBNP No results found for: PROBNP  Specialty Problems      Pulmonary Problems   COPD    Followed in Pulmonary clinic/ [facility]/ [place] -  04/13/2014 p extensive coaching HFA effectiveness =    90%  - 04/13/2014 PFTs FEV1  2.29 p am symbicort 160x 2 (81%) ratio 68 and no change p saba/ DLCO 71 corrects to 81%      OSA (obstructive sleep apnea), uses oxygen at home did not tolerate cpap     He probably needs to have his sleep apnea reassessed, as this could have something with his fatigue and tiredness as related to poor sleep.      DOE (dyspnea on exertion)   Bronchitis, chronic obstructive, with exacerbation (HCC)   Dependence on supplemental oxygen   OSA and COPD overlap syndrome (HCC)   OSA on CPAP    Dec/ 2017 - Sleep study -  The total APNEA/HYPOPNEA INDEX (AHI) was 13.1/hour and the total  RESPIRATORY DISTURBANCE INDEX was 13.1 /hour. 48 events occurred  in REM sleep and 10 events in NREM. The REM AHI was 61.9 /hour,  versus a non-REM AHI of 2.. The patient spent all minutes of  total sleep time in the non -supine position (357 minutes). The  supine AHI was 0.0 versus a non-supine AHI of 13.0.       Allergic rhinitis due to animal hair and dander   CAP (community acquired pneumonia)   Respiratory failure (HCC)      Allergies  Allergen Reactions  . Codeine Itching  . Duloxetine Hcl Other (See Comments)    Took at night and still felt very lethargic the following day.  (malaise)    Immunization History  Administered Date(s) Administered  . Fluad Quad(high Dose 65+) 02/23/2020  . Influenza Split 01/15/2014  . Influenza-Unspecified 03/10/2018  . PFIZER SARS-COV-2 Vaccination 06/19/2019, 07/16/2019    Past Medical History:  Diagnosis Date  . CAD S/P percutaneous coronary angioplasty -- D1, Xience Xpedition DES 2.5 mm x 33 mm 11/24/2011   a) Mild-to-moderate 30-40% lesions in the RCA, LAD and Circumflex. b) CULPRIT LESION: long tubular 70-80% lesion in D1 with FFR of 0.7 --> PCI w/ Xience Xpedition DES 2.5 mm x 30 mm (2.65 MM); c) Lexiscan Myoview 11/2013: No Ischemia or Infarct (Inferior Gut Attenuation) EF  63%.  Marland Kitchen COPD (chronic obstructive pulmonary disease) ([place])    "don't have full case of it; I'm right there at it"  . Essential hypertension 11/24/2011  . Exertional dyspnea, chronic   . Gout   . History of Unstable angina 11/24/2011   Referred for cardiac catheterization  . Hyperlipidemia with target LDL less than 70 11/24/2011  . Obesity (BMI 30.0-34.9)   . OSA (obstructive sleep apnea), uses oxygen at home did not tolerate cpap 11/24/2011    Tobacco History: Social History   Tobacco Use  Smoking Status Former Smoker  . Packs/day: 1.00  . Years: 40.00  .  Pack years: 40.00  . Types: Cigarettes  . Quit date: 06/10/2016  . Years since quitting: 3.7  Smokeless Tobacco Never Used  Tobacco Comment   02/18/14- smokes occ cig maybe 3 x per wk   Counseling given: Not Answered Comment: 02/18/14- smokes occ cig maybe 3 x per wk   Continue to not smoke  Outpatient Encounter Medications as of 02/23/2020  Medication Sig  . acetaminophen (TYLENOL) 650 MG CR tablet Take 1,950 mg by mouth daily as needed for pain.   Marland Kitchen ALPRAZolam (XANAX) 0.25 MG tablet Take 0.5 tablets (0.125 mg total) by mouth every 6 (six) hours as needed for anxiety.  Marland Kitchen amLODipine (NORVASC) 5 MG tablet Take 1 tablet by mouth daily.  Marland Kitchen arformoterol (BROVANA) 15 MCG/2ML NEBU Take 2 mLs (15 mcg total) by nebulization 2 (two) times daily.  . budesonide (PULMICORT) 0.5 MG/2ML nebulizer solution Take 2 mLs (0.5 mg total) by nebulization 2 (two) times daily.  . clopidogrel (PLAVIX) 75 MG tablet Take 1 tablet (75 mg total) by mouth daily.  . colchicine 0.6 MG tablet Take 1 tablet (0.6 mg total) by mouth daily.  . diclofenac Sodium (VOLTAREN) 1 % GEL Apply 2 g topically 2 (two) times daily as needed (shoulder pain).  Marland Kitchen doxycycline (VIBRAMYCIN) 100 MG capsule Take 1 capsule (100 mg total) by mouth 2 (two) times daily.  Marland Kitchen gabapentin (NEURONTIN) 300 MG capsule Take 300-600 mg by mouth See admin instructions. Take 300 mg by mouth in the  morning and 600 mg at bedtime  . glipiZIDE (GLUCOTROL XL) 2.5 MG 24 hr tablet Take 2.5 mg by mouth every morning.  Marland Kitchen guaiFENesin (MUCINEX) 600 MG 12 hr tablet Take 2 tablets (1,200 mg total) by mouth 2 (two) times daily.  Marland Kitchen lidocaine (LIDODERM) 5 % Place 1 patch onto the skin daily. Remove & Discard patch within 12 hours or as directed by MD  . loratadine (CLARITIN) 10 MG tablet Take 1 tablet (10 mg total) by mouth daily.  . Melatonin 3 MG TABS Take 1 tablet (3 mg total) by mouth at bedtime.  . Menthol-Methyl Salicylate (MUSCLE RUB) 10-15 % CREA Apply 1 application topically as needed for muscle pain.  . metFORMIN (GLUCOPHAGE) 500 MG tablet Take 0.5 tablets (250 mg total) by mouth daily with breakfast. (Patient taking differently: Take 500 mg by mouth daily with breakfast. )  . metoprolol tartrate (LOPRESSOR) 25 MG tablet Take 1 tablet (25 mg total) by mouth 2 (two) times daily.  . Multiple Vitamin (MULTIVITAMIN WITH MINERALS) TABS tablet Take 1 tablet by mouth daily.  . traMADol (ULTRAM) 50 MG tablet Take 1 tablet (50 mg total) by mouth daily as needed.  . umeclidinium-vilanterol (ANORO ELLIPTA) 62.5-25 MCG/INH AEPB Inhale 1 puff into the lungs daily.  Marland Kitchen zolpidem (AMBIEN) 10 MG tablet Take 10 mg by mouth at bedtime.   Marland Kitchen esomeprazole (NEXIUM) 20 MG capsule Take 1 capsule (20 mg total) by mouth daily at 12 noon. (Patient not taking: Reported on 03/11/2019)  . ipratropium-albuterol (DUONEB) 0.5-2.5 (3) MG/3ML SOLN Take 3 mLs by nebulization 3 (three) times daily. (Patient not taking: Reported on 03/13/2019)  . nitroGLYCERIN (NITROSTAT) 0.4 MG SL tablet Place 1 tablet (0.4 mg total) under the tongue every 5 (five) minutes as needed for chest pain.  Marland Kitchen umeclidinium-vilanterol (ANORO ELLIPTA) 62.5-25 MCG/INH AEPB Inhale 1 puff into the lungs daily.   No facility-administered encounter medications on file as of 02/23/2020.     Review of Systems  Review of Systems  Constitutional: Negative for  activity change, chills, fatigue, fever and unexpected weight change.  HENT: Positive for congestion. Negative for postnasal drip, rhinorrhea, sinus pressure, sinus pain and sore throat.   Eyes: Negative.   Respiratory: Positive for cough and wheezing. Negative for shortness of breath.   Cardiovascular: Negative for chest pain and palpitations.  Gastrointestinal: Negative for constipation, diarrhea, nausea and vomiting.  Endocrine: Negative.   Genitourinary: Negative.   Musculoskeletal: Negative.   Skin: Negative.   Neurological: Negative for dizziness and headaches.  Psychiatric/Behavioral: Negative.  Negative for dysphoric mood. The patient is not nervous/anxious.   All other systems reviewed and are negative.    Physical Exam  BP 126/70   Pulse 73   Temp (!) 97.2 F (36.2 C) (Tympanic)   Wt 186 lb (84.4 kg)   SpO2 97%   BMI 30.95 kg/m   Wt Readings from Last 5 Encounters:  02/23/20 186 lb (84.4 kg)  06/10/18 178 lb 12.8 oz (81.1 kg)  06/04/18 179 lb (81.2 kg)  05/22/18 172 lb 2.9 oz (78.1 kg)  05/02/18 175 lb 14.8 oz (79.8 kg)    BMI Readings from Last 5 Encounters:  02/23/20 30.95 kg/m  06/10/18 29.75 kg/m  06/04/18 28.89 kg/m  05/22/18 27.79 kg/m  05/02/18 28.40 kg/m     Physical Exam Vitals and nursing note reviewed.  Constitutional:      General: He is not in acute distress.    Appearance: Normal appearance. He is normal weight.  HENT:     Head: Normocephalic and atraumatic.     Right Ear: Hearing and external ear normal.     Left Ear: Hearing and external ear normal.     Nose: Nose normal. No mucosal edema or rhinorrhea.     Right Turbinates: Not enlarged.     Left Turbinates: Not enlarged.     Mouth/Throat:     Mouth: Mucous membranes are dry.     Pharynx: Oropharynx is clear. No oropharyngeal exudate.  Eyes:     Pupils: Pupils are equal, round, and reactive to light.  Cardiovascular:     Rate and Rhythm: Normal rate and regular rhythm.      Pulses: Normal pulses.     Heart sounds: Normal heart sounds. No murmur heard.   Pulmonary:     Effort: Pulmonary effort is normal.     Breath sounds: No decreased breath sounds, wheezing or rales.     Comments: Diminished breath sounds throughout exam Musculoskeletal:     Cervical back: Normal range of motion.     Right lower leg: No edema.     Left lower leg: No edema.  Lymphadenopathy:     Cervical: No cervical adenopathy.  Skin:    General: Skin is warm and dry.     Capillary Refill: Capillary refill takes less than 2 seconds.     Findings: No erythema or rash.  Neurological:     General: No focal deficit present.     Mental Status: He is alert and oriented to person, place, and time.     Motor: No weakness.     Coordination: Coordination normal.     Gait: Gait is intact. Gait normal.  Psychiatric:        Mood and Affect: Mood normal.        Behavior: Behavior normal. Behavior is cooperative.        Thought Content: Thought content normal.        Judgment: Judgment normal.  Assessment & Plan:   OSA on CPAP Patient with known mild obstructive sleep apnea REM sleep with severe obstructive sleep apnea with an AHI around 61.9 Poor compliance with CPAP therapy Patient reporting he estimated the use his CPAP around 5 times over the last 30 days Is maintained on nighttime O2 bled in through CPAP  Plan: Offered sleep MD referral, patient declined Encouraged patient to utilize CPAP therapy every night If patient still struggling with CPAP compliance in 2 months at next follow-up would recommend that he establish care with a sleep MD in our office for further assistance   COPD Plan: Resume Anoro Ellipta Samples provided today Prescription for Anoro Ellipta to be sent in today Reviewed that patient needs to be maintained on this every day, also emphasized the patient needs to obtain medication from the pharmacy as we cannot guarantee or sample stock Chest x-ray  today Seasonal flu vaccine today Patient to check with the local health department on cost of Pneumovax 23 as well as COVID-19 booster  Healthcare maintenance Plan: Seasonal flu vaccine today Patient to investigate cost of Pneumovax as well as COVID-19 booster at the health department    Return in about 2 months (around 04/24/2020), or if symptoms worsen or fail to improve, for Follow up with Dr. Lamonte [name].   Lauraine Rinne, NP 02/23/2020   This appointment required 34 minutes of patient care (this includes precharting, chart review, review of results, face-to-face care, etc.).

## 2020-02-23 NOTE — Assessment & Plan Note (Signed)
Patient with known mild obstructive sleep apnea REM sleep with severe obstructive sleep apnea with an AHI around 61.9 Poor compliance with CPAP therapy Patient reporting he estimated the use his CPAP around 5 times over the last 30 days Is maintained on nighttime O2 bled in through CPAP  Plan: Offered sleep MD referral, patient declined Encouraged patient to utilize CPAP therapy every night If patient still struggling with CPAP compliance in 2 months at next follow-up would recommend that he establish care with a sleep MD in our office for further assistance

## 2020-02-23 NOTE — Addendum Note (Signed)
Addended by: Elie Confer on: 02/23/2020 04:32 PM   Modules accepted: Orders

## 2020-02-25 ENCOUNTER — Encounter: Payer: Self-pay | Admitting: *Deleted

## 2020-04-16 ENCOUNTER — Other Ambulatory Visit: Payer: Self-pay | Admitting: Emergency Medicine

## 2020-04-26 ENCOUNTER — Ambulatory Visit: Payer: Medicare Other | Admitting: Pulmonary Disease

## 2020-05-13 DIAGNOSIS — I129 Hypertensive chronic kidney disease with stage 1 through stage 4 chronic kidney disease, or unspecified chronic kidney disease: Secondary | ICD-10-CM | POA: Diagnosis not present

## 2020-05-13 DIAGNOSIS — N1831 Chronic kidney disease, stage 3a: Secondary | ICD-10-CM | POA: Diagnosis not present

## 2020-05-13 DIAGNOSIS — E1151 Type 2 diabetes mellitus with diabetic peripheral angiopathy without gangrene: Secondary | ICD-10-CM | POA: Diagnosis not present

## 2020-05-13 DIAGNOSIS — G629 Polyneuropathy, unspecified: Secondary | ICD-10-CM | POA: Diagnosis not present

## 2020-05-13 DIAGNOSIS — I10 Essential (primary) hypertension: Secondary | ICD-10-CM | POA: Diagnosis not present

## 2020-05-13 DIAGNOSIS — J449 Chronic obstructive pulmonary disease, unspecified: Secondary | ICD-10-CM | POA: Diagnosis not present

## 2020-05-13 DIAGNOSIS — I739 Peripheral vascular disease, unspecified: Secondary | ICD-10-CM | POA: Diagnosis not present

## 2020-05-13 DIAGNOSIS — M25512 Pain in left shoulder: Secondary | ICD-10-CM | POA: Diagnosis not present

## 2020-05-13 DIAGNOSIS — G4733 Obstructive sleep apnea (adult) (pediatric): Secondary | ICD-10-CM | POA: Diagnosis not present

## 2020-05-13 DIAGNOSIS — Z794 Long term (current) use of insulin: Secondary | ICD-10-CM | POA: Diagnosis not present

## 2020-05-13 DIAGNOSIS — R5383 Other fatigue: Secondary | ICD-10-CM | POA: Diagnosis not present

## 2020-06-03 DIAGNOSIS — E1151 Type 2 diabetes mellitus with diabetic peripheral angiopathy without gangrene: Secondary | ICD-10-CM | POA: Diagnosis not present

## 2020-06-03 DIAGNOSIS — R739 Hyperglycemia, unspecified: Secondary | ICD-10-CM | POA: Diagnosis not present

## 2020-06-21 IMAGING — DX DG CHEST 1V PORT
1 series · 1 of 1 positions shown · non-contrast
Comparison: Radiograph April 25, 2018.

CLINICAL DATA: Encounter for intubation.

EXAM:
PORTABLE CHEST 1 VIEW

[chest]
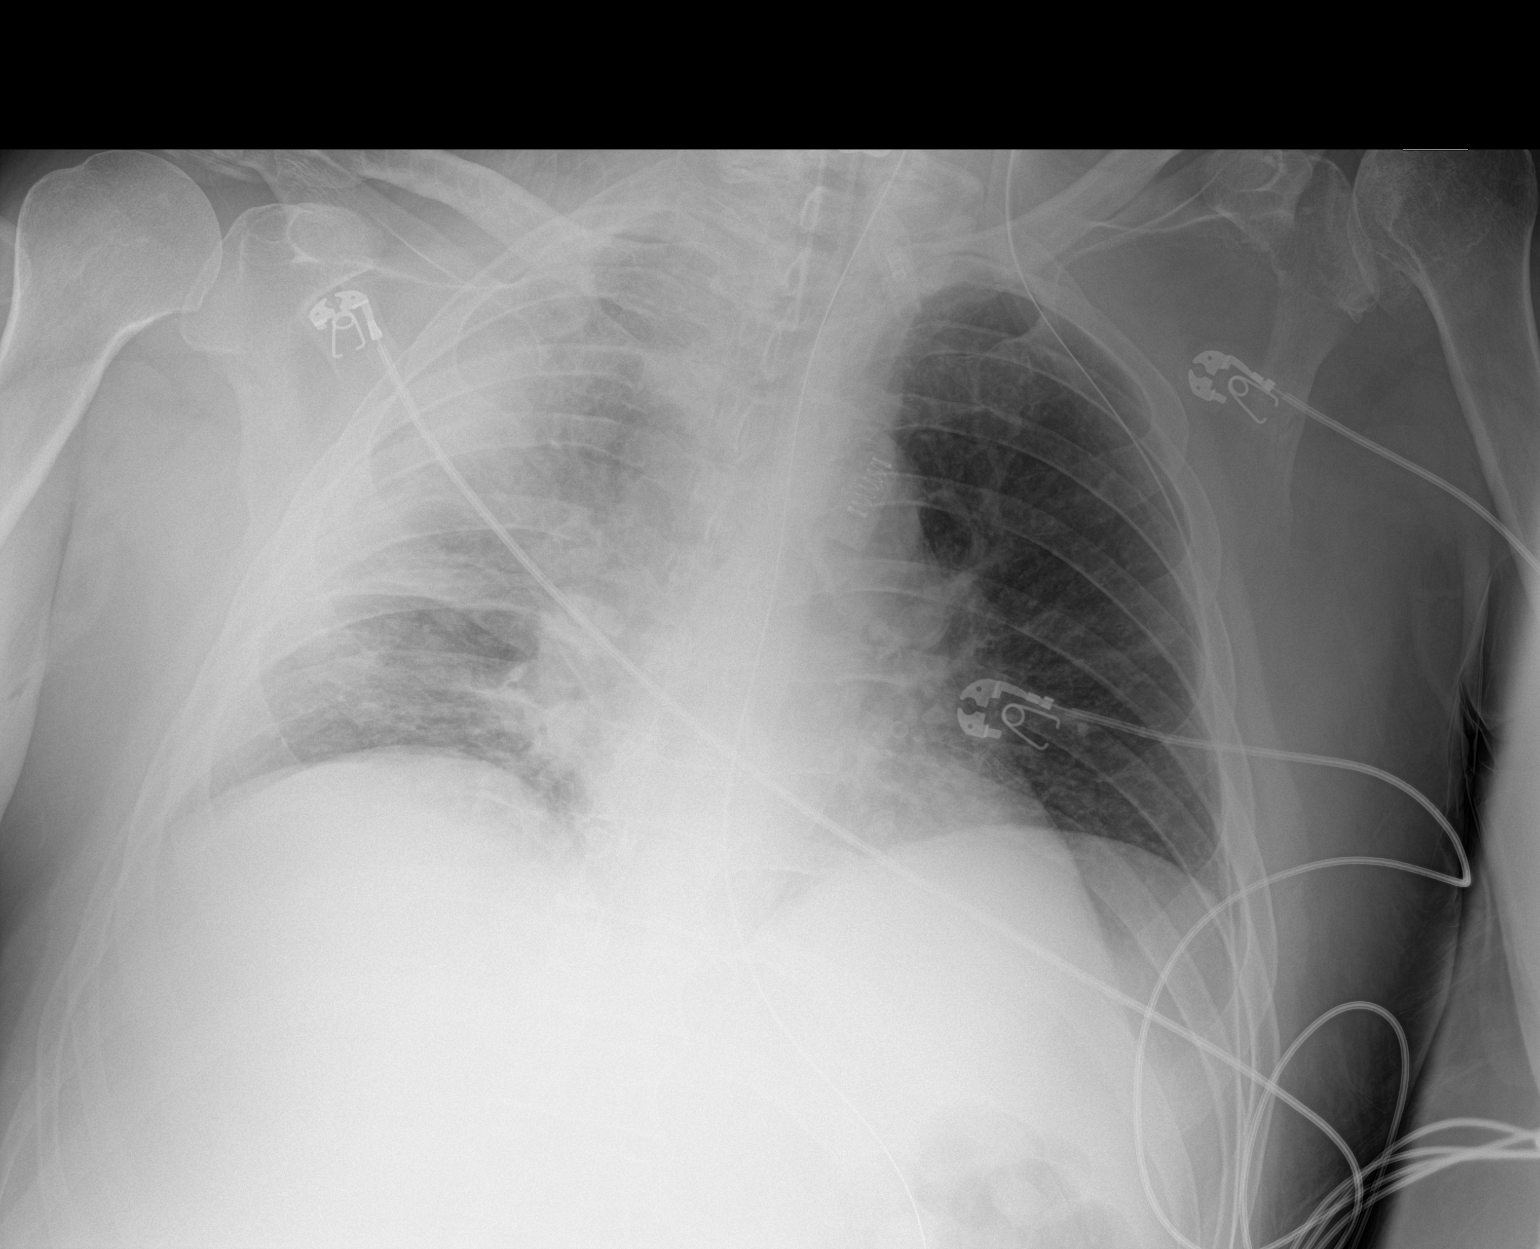

[1 of 1 positions shown; findings below may reference images not displayed]

FINDINGS: The heart size and mediastinal contours are within normal limits.
Endotracheal and nasogastric tubes are unchanged in position. No
pneumothorax is noted. Left lung is clear. Hypoinflation of the
lungs is noted. Right upper lobe airspace opacity is noted
concerning for pneumonia or atelectasis. The visualized skeletal
structures are unremarkable.
IMPRESSION: Stable support apparatus. Hypoinflation of the lungs. Stable right
upper lobe opacity as described above.

## 2020-06-28 IMAGING — DX DG CHEST 1V PORT
1 series · 1 of 1 positions shown · non-contrast
Comparison: 04/27/2018

CLINICAL DATA: Shortness of breath

EXAM:
PORTABLE CHEST 1 VIEW

[chest]
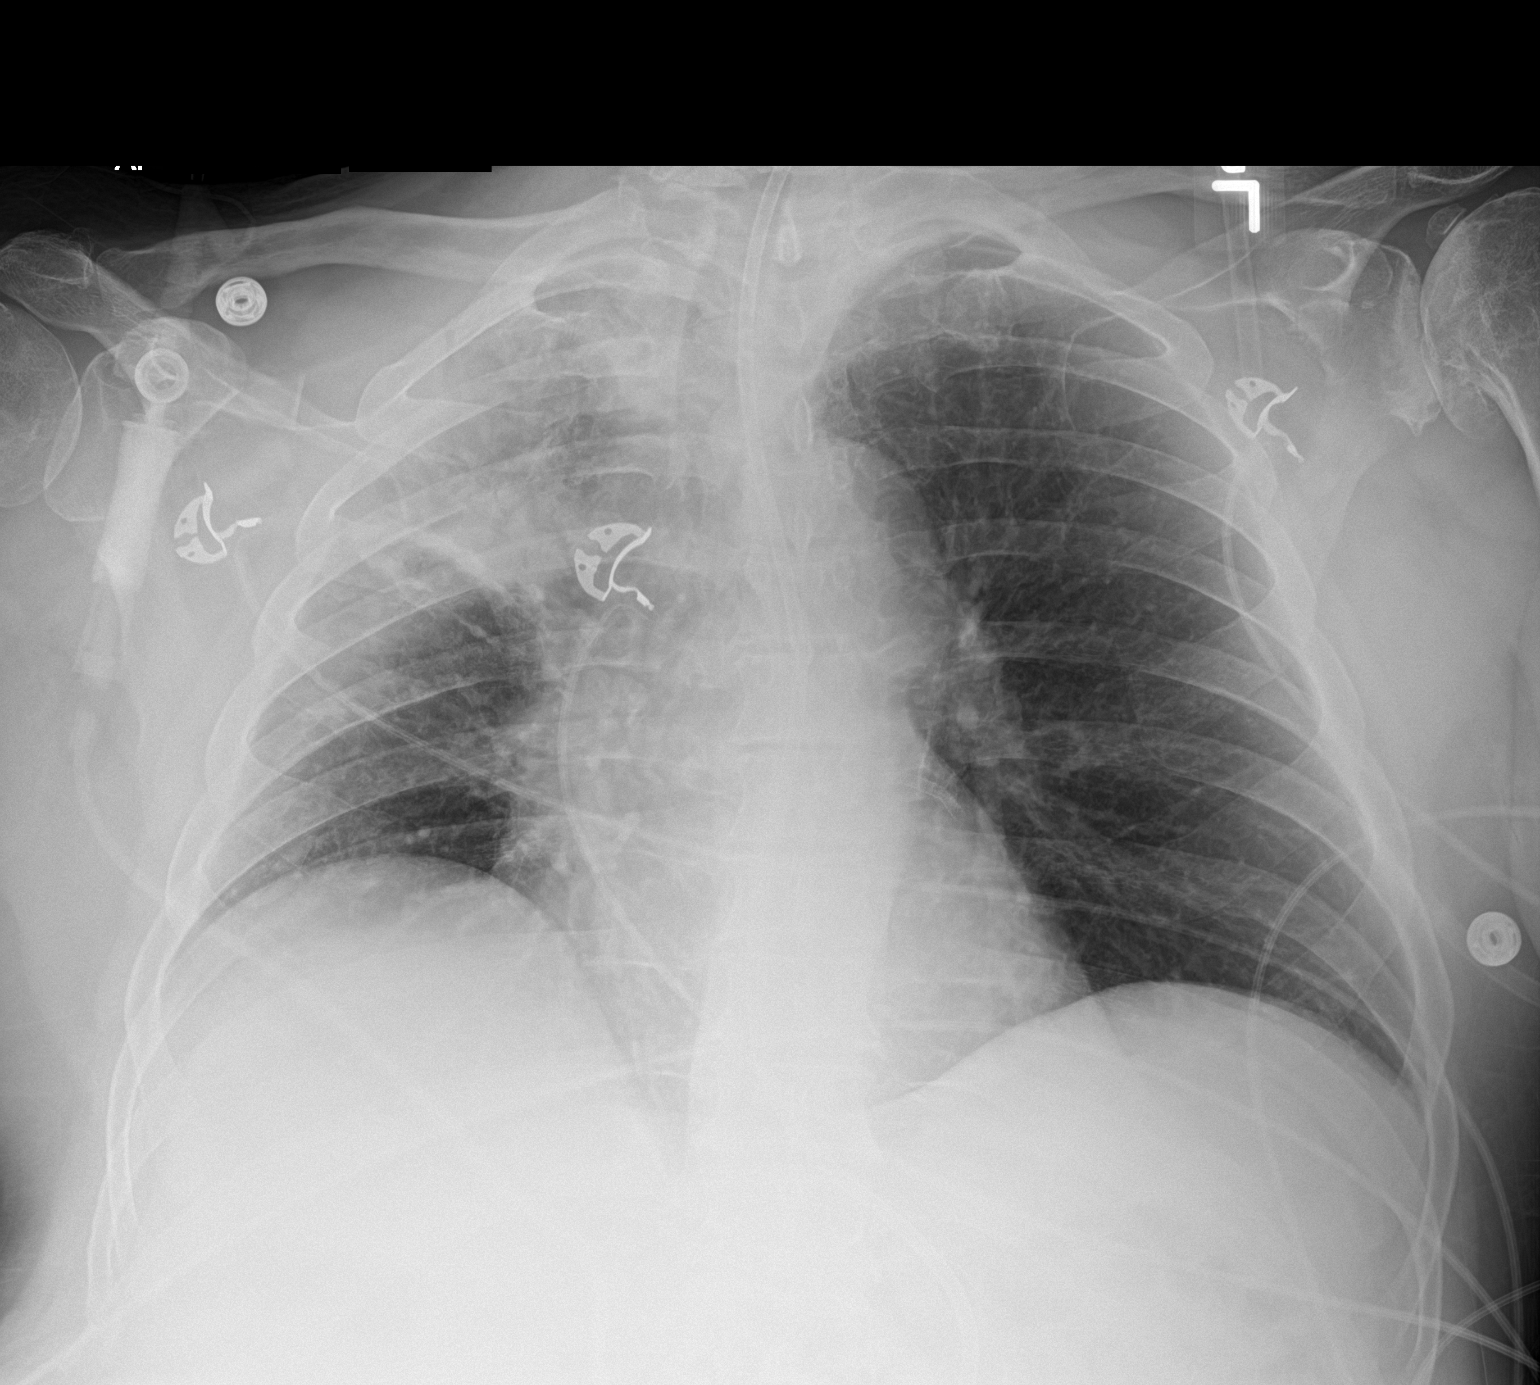

[1 of 1 positions shown; findings below may reference images not displayed]

FINDINGS: Endotracheal tube has been removed. Feeding catheter remains
extending into the stomach. Postsurgical changes in the cervical
spine are noted. Cardiac shadow is stable. Left lung remains clear.
Persistent right upper lobe infiltrate is noted
IMPRESSION: Stable right upper lobe infiltrate.

## 2020-07-14 ENCOUNTER — Other Ambulatory Visit: Payer: Self-pay

## 2020-07-14 ENCOUNTER — Emergency Department (HOSPITAL_COMMUNITY): Payer: Medicare Other

## 2020-07-14 ENCOUNTER — Encounter (HOSPITAL_COMMUNITY): Payer: Self-pay

## 2020-07-14 ENCOUNTER — Emergency Department (HOSPITAL_COMMUNITY)
Admission: EM | Admit: 2020-07-14 | Discharge: 2020-07-14 | Disposition: A | Discharge status: Home / Self Care | Payer: Medicare Other | Attending: Emergency Medicine | Admitting: Emergency Medicine

## 2020-07-14 DIAGNOSIS — E1142 Type 2 diabetes mellitus with diabetic polyneuropathy: Secondary | ICD-10-CM | POA: Insufficient documentation

## 2020-07-14 DIAGNOSIS — J449 Chronic obstructive pulmonary disease, unspecified: Secondary | ICD-10-CM | POA: Diagnosis not present

## 2020-07-14 DIAGNOSIS — F32A Depression, unspecified: Secondary | ICD-10-CM | POA: Diagnosis not present

## 2020-07-14 DIAGNOSIS — Z7951 Long term (current) use of inhaled steroids: Secondary | ICD-10-CM | POA: Insufficient documentation

## 2020-07-14 DIAGNOSIS — Z7902 Long term (current) use of antithrombotics/antiplatelets: Secondary | ICD-10-CM | POA: Diagnosis not present

## 2020-07-14 DIAGNOSIS — Z87891 Personal history of nicotine dependence: Secondary | ICD-10-CM | POA: Insufficient documentation

## 2020-07-14 DIAGNOSIS — I10 Essential (primary) hypertension: Secondary | ICD-10-CM | POA: Insufficient documentation

## 2020-07-14 DIAGNOSIS — I2510 Atherosclerotic heart disease of native coronary artery without angina pectoris: Secondary | ICD-10-CM | POA: Diagnosis not present

## 2020-07-14 DIAGNOSIS — R531 Weakness: Secondary | ICD-10-CM

## 2020-07-14 DIAGNOSIS — E1151 Type 2 diabetes mellitus with diabetic peripheral angiopathy without gangrene: Secondary | ICD-10-CM | POA: Diagnosis not present

## 2020-07-14 DIAGNOSIS — E1310 Other specified diabetes mellitus with ketoacidosis without coma: Secondary | ICD-10-CM | POA: Diagnosis not present

## 2020-07-14 DIAGNOSIS — E1165 Type 2 diabetes mellitus with hyperglycemia: Secondary | ICD-10-CM | POA: Diagnosis not present

## 2020-07-14 DIAGNOSIS — Z7984 Long term (current) use of oral hypoglycemic drugs: Secondary | ICD-10-CM | POA: Insufficient documentation

## 2020-07-14 DIAGNOSIS — R0602 Shortness of breath: Secondary | ICD-10-CM | POA: Diagnosis not present

## 2020-07-14 DIAGNOSIS — R739 Hyperglycemia, unspecified: Secondary | ICD-10-CM | POA: Diagnosis not present

## 2020-07-14 DIAGNOSIS — Z79899 Other long term (current) drug therapy: Secondary | ICD-10-CM | POA: Insufficient documentation

## 2020-07-14 HISTORY — DX: Type 2 diabetes mellitus without complications: E11.9

## 2020-07-14 LAB — CBC WITH DIFFERENTIAL/PLATELET
Abs Immature Granulocytes: 0.03 10*3/uL (ref 0.00–0.07)
Basophils Absolute: 0.1 10*3/uL (ref 0.0–0.1)
Basophils Relative: 1 %
Eosinophils Absolute: 0.4 10*3/uL (ref 0.0–0.5)
Eosinophils Relative: 5 %
HCT: 44 % (ref 39.0–52.0)
Hemoglobin: 13.8 g/dL (ref 13.0–17.0)
Immature Granulocytes: 0 %
Lymphocytes Relative: 26 %
Lymphs Abs: 2.2 10*3/uL (ref 0.7–4.0)
MCH: 29.8 pg (ref 26.0–34.0)
MCHC: 31.4 g/dL (ref 30.0–36.0)
MCV: 95 fL (ref 80.0–100.0)
Monocytes Absolute: 0.6 10*3/uL (ref 0.1–1.0)
Monocytes Relative: 7 %
Neutro Abs: 5.2 10*3/uL (ref 1.7–7.7)
Neutrophils Relative %: 61 %
Platelets: 181 10*3/uL (ref 150–400)
RBC: 4.63 MIL/uL (ref 4.22–5.81)
RDW: 12.9 % (ref 11.5–15.5)
WBC: 8.6 10*3/uL (ref 4.0–10.5)
nRBC: 0 % (ref 0.0–0.2)

## 2020-07-14 LAB — BASIC METABOLIC PANEL
Anion gap: 9 (ref 5–15)
BUN: 11 mg/dL (ref 8–23)
CO2: 25 mmol/L (ref 22–32)
Calcium: 9.7 mg/dL (ref 8.9–10.3)
Chloride: 98 mmol/L (ref 98–111)
Creatinine, Ser: 0.88 mg/dL (ref 0.61–1.24)
GFR, Estimated: >60 mL/min (ref >60)
Glucose, Bld: 343 mg/dL — ABNORMAL HIGH (ref 70–99)
Potassium: 4.2 mmol/L (ref 3.5–5.1)
Sodium: 132 mmol/L — ABNORMAL LOW (ref 135–145)

## 2020-07-14 LAB — URINALYSIS, ROUTINE W REFLEX MICROSCOPIC
Bacteria, UA: NONE SEEN
Bilirubin Urine: NEGATIVE
Glucose, UA: >=500 mg/dL — AB
Hgb urine dipstick: NEGATIVE
Ketones, ur: NEGATIVE mg/dL
Leukocytes,Ua: NEGATIVE
Nitrite: NEGATIVE
Protein, ur: NEGATIVE mg/dL
Specific Gravity, Urine: 1.023 (ref 1.005–1.030)
pH: 5 (ref 5.0–8.0)

## 2020-07-14 LAB — CBG MONITORING, ED
Glucose-Capillary: 319 mg/dL — ABNORMAL HIGH (ref 70–99)
Glucose-Capillary: 188 mg/dL — ABNORMAL HIGH (ref 70–99)

## 2020-07-14 LAB — TSH: TSH: 2.852 u[IU]/mL (ref 0.350–4.500)

## 2020-07-14 MED ORDER — SODIUM CHLORIDE 0.9 % IV BOLUS
1000.0000 mL | Freq: Once | INTRAVENOUS | Status: AC
Start: 2020-07-14 — End: 2020-07-14
  Administered 2020-07-14: 1000 mL via INTRAVENOUS

## 2020-07-14 NOTE — Discharge Instructions (Signed)
You have been evaluated for your symptoms.  Although your blood sugar is elevated, fortunately no evidence of diabetic ketoacidosis.  Continue using your insulin but also discuss medication changes with your doctor for better blood sugar control.  You did mention having muscle aches.  Some medication, such as Metformin can potentially cause muscle aches.  Discuss this with your doctor.  Return to the ER if you have any concerns.  Use inhaler as needed for shortness of breath.

## 2020-07-14 NOTE — ED Triage Notes (Addendum)
Patient complains of bilateral leg weakness for 2 weeks. States he is having a hard time controlling bladder as well. Reports blood sugar running in the 500s at home. Denies pain. Patient also with audible wheezing and dry cough. Sent from MD office due to hyperglycemia and COPD

## 2020-07-14 NOTE — ED Notes (Signed)
Pt placed on 2L O2 [place] after Sats dropped to 90%

## 2020-07-14 NOTE — ED Provider Notes (Signed)
[facility] EMERGENCY DEPARTMENT Provider Note   CSN: [id number] Arrival date & time: 07/14/20  1048     History No chief complaint on file.   Paul [name] is a 70 y.o. male.  The history is provided by the patient and medical records. No language interpreter was used.     70 year old male significant history of CAD, COPD, OSA, hyperlipidemia, hypertension, insulin-dependent diabetes, presented complaining of generalized weakness.  Patient report for the past 2 weeks he have not been feeling well.  States that he feels very weak and tired, states that both of his legs feels heavy as well since left shoulder.  He does endorse polyuria and polydipsia.  Report not having much energy.  Reports feeling depressed about family situation.  No SI HI.  He has been compliant with his diabetic medication.  He denies any headache, chest pain, worsening shortness of breath, abdominal pain, dysuria.  He admits that his blood sugar has been running a bit high.  He went to see his PCP today but was told to come to the ER for further care.  He denies any recent sickness.  Patient also denies any focal numbness.  He does endorse chronic shortness of breath due to his COPD but no worsening shortness of breath or productive cough.  Uses a walker at home.  Past Medical History:  Diagnosis Date  . CAD S/P percutaneous coronary angioplasty -- D1, Xience Xpedition DES 2.5 mm x 33 mm 11/24/2011   a) Mild-to-moderate 30-40% lesions in the RCA, LAD and Circumflex. b) CULPRIT LESION: long tubular 70-80% lesion in D1 with FFR of 0.7 --> PCI w/ Xience Xpedition DES 2.5 mm x 30 mm (2.65 MM); c) Lexiscan Myoview 11/2013: No Ischemia or Infarct (Inferior Gut Attenuation) EF 63%.  Marland Kitchen COPD (chronic obstructive pulmonary disease) ([place])    "don't have full case of it; I'm right there at it"  . Diabetes mellitus without complication ([place])   . Essential hypertension 11/24/2011  . Exertional dyspnea, chronic   . Gout    . History of Unstable angina 11/24/2011   Referred for cardiac catheterization  . Hyperlipidemia with target LDL less than 70 11/24/2011  . Obesity (BMI 30.0-34.9)   . OSA (obstructive sleep apnea), uses oxygen at home did not tolerate cpap 11/24/2011    Patient Active Problem List   Diagnosis Date Noted  . Healthcare maintenance 02/23/2020  . Primary osteoarthritis, left shoulder 07/29/2018  . Prolonged QT interval   . Labile blood glucose   . Type 2 diabetes mellitus with peripheral neuropathy (HCC)   . Labile blood pressure   . Sleep disturbance   . Hypoalbuminemia due to protein-calorie malnutrition ([place])   . Poorly controlled type 2 diabetes mellitus with peripheral neuropathy ([place])   . Dysphagia   . Candidiasis   . Acute encephalopathy 05/03/2018  . Pressure injury of skin 04/30/2018  . Coronary artery disease involving native coronary artery of native heart without angina pectoris   . Tobacco abuse   . Hypertension   . Acute blood loss anemia   . Bacteremia   . Respiratory failure ([place])   . CAP (community acquired pneumonia) 04/21/2018  . Corneal irritation of both eyes 10/31/2017  . Allergic rhinitis due to animal hair and dander 10/31/2017  . OSA on CPAP 10/10/2016  . OSA and COPD overlap syndrome ([place]) 03/02/2016  . Bronchitis, chronic obstructive, with exacerbation ([place]) 03/02/2016  . Hypoxemia 03/02/2016  . Dependence on supplemental oxygen 03/02/2016  .  CAD, multiple vessel 03/02/2016  . Gastroesophageal reflux disease 03/02/2016  . HTN (hypertension), malignant 03/02/2016  . Cigarette smoker 02/22/2014  . DOE (dyspnea on exertion) 01/20/2014  . Obesity (BMI 30.0-34.9)   . Essential hypertension 11/24/2011  . Hyperlipidemia with target LDL less than 70 11/24/2011  . OSA (obstructive sleep apnea), uses oxygen at home did not tolerate cpap 11/24/2011  . COPD 11/24/2011  . CAD S/P percutaneous coronary angioplasty -- D1, Xience Xpedition DES 2.5 mm x 33 mm  11/24/2011    Past Surgical History:  Procedure Laterality Date  . CERVICAL SPINE SURGERY  2012  . CORONARY ANGIOPLASTY WITH STENT PLACEMENT  11/23/2011   "1; first one"  . LEFT HEART CATHETERIZATION WITH CORONARY ANGIOGRAM N/A 11/23/2011   Procedure: LEFT HEART CATHETERIZATION WITH CORONARY ANGIOGRAM;  Surgeon: Leonie Man, MD;  Location: [facility] C[facility] LAB;  Service: Cardiovascular;  Laterality: N/A;       Family History  Problem Relation Age of Onset  . Heart disease Sister   . Heart attack Brother   . Emphysema Father        smoked  . Aneurysm Father     Social History   Tobacco Use  . Smoking status: Former Smoker    Packs/day: 1.00    Years: 40.00    Pack years: 40.00    Types: Cigarettes    Quit date: 06/10/2016    Years since quitting: 4.0  . Smokeless tobacco: Never Used  . Tobacco comment: 02/18/14- smokes occ cig maybe 3 x per wk  Substance Use Topics  . Alcohol use: Yes    Alcohol/week: 0.0 standard drinks    Comment: social 2-3 drink a week  . Drug use: No    Home Medications Prior to Admission medications   Medication Sig Start Date End Date Taking? Authorizing Provider  acetaminophen (TYLENOL) 650 MG CR tablet Take 1,950 mg by mouth daily as needed for pain.     [provider]  ALPRAZolam Duanne Moron) 0.25 MG tablet Take 0.5 tablets (0.125 mg total) by mouth every 6 (six) hours as needed for anxiety. 05/22/18   Angiulli, Lavon Paganini, PA-C  amLODipine (NORVASC) 5 MG tablet Take 1 tablet by mouth daily. 07/01/18   [provider]  arformoterol (BROVANA) 15 MCG/2ML NEBU Take 2 mLs (15 mcg total) by nebulization 2 (two) times daily. 05/03/18   Sheikh, Omair Latif, DO  budesonide (PULMICORT) 0.5 MG/2ML nebulizer solution Take 2 mLs (0.5 mg total) by nebulization 2 (two) times daily. 05/03/18   Raiford Noble Latif, DO  clopidogrel (PLAVIX) 75 MG tablet Take 1 tablet (75 mg total) by mouth daily. 05/22/18   Angiulli, Lavon Paganini, PA-C  colchicine 0.6 MG tablet Take  1 tablet (0.6 mg total) by mouth daily. 05/22/18   Angiulli, Lavon Paganini, PA-C  diclofenac Sodium (VOLTAREN) 1 % GEL Apply 2 g topically 2 (two) times daily as needed (shoulder pain).    [provider]  doxycycline (VIBRAMYCIN) 100 MG capsule Take 1 capsule (100 mg total) by mouth 2 (two) times daily. 03/12/19   Larene Pickett, PA-C  esomeprazole (NEXIUM) 20 MG capsule Take 1 capsule (20 mg total) by mouth daily at 12 noon. Patient not taking: Reported on 03/11/2019 05/22/18   Angiulli, Lavon Paganini, PA-C  gabapentin (NEURONTIN) 300 MG capsule Take 300-600 mg by mouth See admin instructions. Take 300 mg by mouth in the morning and 600 mg at bedtime    [provider]  glipiZIDE (GLUCOTROL XL) 2.5  MG 24 hr tablet Take 2.5 mg by mouth every morning. 02/24/19   [provider]  guaiFENesin (MUCINEX) 600 MG 12 hr tablet Take 2 tablets (1,200 mg total) by mouth 2 (two) times daily. 05/03/18   Sheikh, Omair Latif, DO  ipratropium-albuterol (DUONEB) 0.5-2.5 (3) MG/3ML SOLN Take 3 mLs by nebulization 3 (three) times daily. Patient not taking: Reported on 03/13/2019 05/03/18   Raiford Noble Latif, DO  lidocaine (LIDODERM) 5 % Place 1 patch onto the skin daily. Remove & Discard patch within 12 hours or as directed by MD 05/22/18   Angiulli, Lavon Paganini, PA-C  loratadine (CLARITIN) 10 MG tablet Take 1 tablet (10 mg total) by mouth daily. 05/22/18   Angiulli, Lavon Paganini, PA-C  Melatonin 3 MG TABS Take 1 tablet (3 mg total) by mouth at bedtime. 05/22/18   Angiulli, Lavon Paganini, PA-C  Menthol-Methyl Salicylate (MUSCLE RUB) 10-15 % CREA Apply 1 application topically as needed for muscle pain.    [provider]  metFORMIN (GLUCOPHAGE) 500 MG tablet Take 0.5 tablets (250 mg total) by mouth daily with breakfast. Patient taking differently: Take 500 mg by mouth daily with breakfast.  05/22/18   Angiulli, Lavon Paganini, PA-C  metoprolol tartrate (LOPRESSOR) 25 MG tablet Take 1 tablet (25 mg total) by mouth 2 (two)  times daily. 05/22/18   Angiulli, Lavon Paganini, PA-C  Multiple Vitamin (MULTIVITAMIN WITH MINERALS) TABS tablet Take 1 tablet by mouth daily. 05/22/18   Angiulli, Lavon Paganini, PA-C  nitroGLYCERIN (NITROSTAT) 0.4 MG SL tablet Place 1 tablet (0.4 mg total) under the tongue every 5 (five) minutes as needed for chest pain. 11/24/11 03/11/19  Isaiah Serge, NP  umeclidinium-vilanterol (ANORO ELLIPTA) 62.5-25 MCG/INH AEPB Inhale 1 puff into the lungs daily. 02/20/20   Collene Gobble, MD  umeclidinium-vilanterol (ANORO ELLIPTA) 62.5-25 MCG/INH AEPB Inhale 1 puff into the lungs daily. 02/23/20   Lauraine Rinne, NP  umeclidinium-vilanterol (ANORO ELLIPTA) 62.5-25 MCG/INH AEPB Inhale 1 puff into the lungs daily. 02/23/20   Lauraine Rinne, NP  zolpidem (AMBIEN) 10 MG tablet Take 10 mg by mouth at bedtime.  07/12/18   [provider]    Allergies    Codeine and Duloxetine hcl  Review of Systems   Review of Systems  All other systems reviewed and are negative.   Physical Exam Updated Vital Signs BP 127/81 (BP Location: Right Arm)   Pulse 76   Temp 98.6 F (37 C)   Resp (!) 22   SpO2 97%   Physical Exam Vitals and nursing note reviewed.  Constitutional:      General: He is not in acute distress.    Appearance: He is well-developed.  HENT:     Head: Atraumatic.     Mouth/Throat:     Mouth: Mucous membranes are moist.  Eyes:     Conjunctiva/sclera: Conjunctivae normal.  Cardiovascular:     Rate and Rhythm: Normal rate and regular rhythm.     Pulses: Normal pulses.     Heart sounds: Normal heart sounds.  Pulmonary:     Effort: Pulmonary effort is normal.     Breath sounds: Normal breath sounds. No wheezing, rhonchi or rales.  Abdominal:     Palpations: Abdomen is soft.     Tenderness: There is no abdominal tenderness.  Musculoskeletal:     Cervical back: Normal range of motion and neck supple.     Right lower leg: No edema.     Left lower leg: No edema.  Comments: Poor effort but equal  strength throughout all 4 extremities  Skin:    Findings: No rash.  Neurological:     Mental Status: He is alert. Mental status is at baseline.     GCS: GCS eye subscore is 4. GCS verbal subscore is 5. GCS motor subscore is 6.     Sensory: Sensation is intact.     Motor: Weakness present.     ED Results / Procedures / Treatments   Labs (all labs ordered are listed, but only abnormal results are displayed) Labs Reviewed  BASIC METABOLIC PANEL - Abnormal; Notable for the following components:      Result Value   Sodium 132 (*)    Glucose, Bld 343 (*)    All other components within normal limits  CBG MONITORING, ED - Abnormal; Notable for the following components:   Glucose-Capillary 319 (*)    All other components within normal limits  CBC WITH DIFFERENTIAL/PLATELET  URINALYSIS, ROUTINE W REFLEX MICROSCOPIC  TSH  CBG MONITORING, ED    EKG EKG Interpretation  Date/Time:  Wednesday July 14 2020 10:59:17 EDT Ventricular Rate:  76 PR Interval:  138 QRS Duration: 84 QT Interval:  412 QTC Calculation: 463 R Axis:   40 Text Interpretation: Normal sinus rhythm Nonspecific T wave abnormality Prolonged QT Abnormal ECG Confirmed by Davonna Belling [phone]) on 07/14/2020 12:45:59 PM   Radiology DG Chest Port 1 View  Result Date: 07/14/2020 CLINICAL DATA:  Shortness of breath. EXAM: PORTABLE CHEST 1 VIEW COMPARISON:  02/23/2020. FINDINGS: Mediastinum and hilar structures normal. Heart size normal. Low lung volumes. No focal infiltrate. No pleural effusion or pneumothorax. Prior cervical spine fusion. IMPRESSION: No acute cardiopulmonary disease. Electronically Signed   By: Marcello Moores  Register   On: 07/14/2020 11:57    Procedures Procedures   Medications Ordered in ED Medications  sodium chloride 0.9 % bolus 1,000 mL (0 mLs Intravenous Stopped 07/14/20 1444)    ED Course  I have reviewed the triage vital signs and the nursing notes.  Pertinent labs & imaging results that were  available during my care of the patient were reviewed by me and considered in my medical decision making (see chart for details).    MDM Rules/Calculators/A&P                          BP 127/74   Pulse 74   Temp 98.6 F (37 C)   Resp (!) 21   SpO2 91%   Final Clinical Impression(s) / ED Diagnoses Final diagnoses:  Hyperglycemia  Depression, unspecified depression type  General weakness    Rx / DC Orders ED Discharge Orders    None     11:25 AM Patient with history of insulin-dependent diabetes report having weakness, polyuria polydipsia as well as feeling depressed.  Denies any active pain.  Was seen by PCP today but was sent here for further management of his condition and also concern for potential DKA.  CBG obtained here shows 319.  Patient without any respiratory discomfort.  Vital signs otherwise stable.  Work-up initiated.  He does states that he felt depressed and is feeling wiped out all the time.  Initial CBG of 348, normal anion gap, UA without evidence of ketone.  No findings suggestive of DKA.  Normal TSH and reassuring electrolyte panels, normal renal function.  Chest x-ray unremarkable.  Patient was given IV fluid and CBG improved to 188.  When ambulating O2 sats remained between  93 to 97% on room air.  Patient without back pain to suggest cauda equina.  At this time, I recommend patient to follow-up closely with PCP for further care.  Prior to discharge he did report having muscle cramping for the past few weeks.  He is currently on Metformin.  Encourage patient to discuss this with his PCP to see if it was medication related causing myalgia.  Care discussed with Dr. Alvino Chapel.     Domenic Moras, PA-C 07/14/20 1528    Davonna Belling, MD 07/14/20 [phone]

## 2020-07-14 NOTE — ED Notes (Signed)
O2 sat 97-93% while ambulating on room air.

## 2020-09-02 DIAGNOSIS — I129 Hypertensive chronic kidney disease with stage 1 through stage 4 chronic kidney disease, or unspecified chronic kidney disease: Secondary | ICD-10-CM | POA: Diagnosis not present

## 2020-09-02 DIAGNOSIS — R0602 Shortness of breath: Secondary | ICD-10-CM | POA: Diagnosis not present

## 2020-09-02 DIAGNOSIS — E118 Type 2 diabetes mellitus with unspecified complications: Secondary | ICD-10-CM | POA: Diagnosis not present

## 2020-09-02 DIAGNOSIS — K219 Gastro-esophageal reflux disease without esophagitis: Secondary | ICD-10-CM | POA: Diagnosis not present

## 2020-09-02 DIAGNOSIS — J449 Chronic obstructive pulmonary disease, unspecified: Secondary | ICD-10-CM | POA: Diagnosis not present

## 2020-09-02 DIAGNOSIS — R739 Hyperglycemia, unspecified: Secondary | ICD-10-CM | POA: Diagnosis not present

## 2020-09-02 DIAGNOSIS — R5383 Other fatigue: Secondary | ICD-10-CM | POA: Diagnosis not present

## 2020-09-02 DIAGNOSIS — E559 Vitamin D deficiency, unspecified: Secondary | ICD-10-CM | POA: Diagnosis not present

## 2020-09-02 DIAGNOSIS — G629 Polyneuropathy, unspecified: Secondary | ICD-10-CM | POA: Diagnosis not present

## 2020-09-02 DIAGNOSIS — R350 Frequency of micturition: Secondary | ICD-10-CM | POA: Diagnosis not present

## 2020-09-21 DIAGNOSIS — E118 Type 2 diabetes mellitus with unspecified complications: Secondary | ICD-10-CM | POA: Diagnosis not present

## 2020-09-21 DIAGNOSIS — J449 Chronic obstructive pulmonary disease, unspecified: Secondary | ICD-10-CM | POA: Diagnosis not present

## 2020-09-21 DIAGNOSIS — R739 Hyperglycemia, unspecified: Secondary | ICD-10-CM | POA: Diagnosis not present

## 2020-09-21 DIAGNOSIS — R350 Frequency of micturition: Secondary | ICD-10-CM | POA: Diagnosis not present

## 2020-09-21 DIAGNOSIS — R5383 Other fatigue: Secondary | ICD-10-CM | POA: Diagnosis not present

## 2020-09-21 DIAGNOSIS — I129 Hypertensive chronic kidney disease with stage 1 through stage 4 chronic kidney disease, or unspecified chronic kidney disease: Secondary | ICD-10-CM | POA: Diagnosis not present

## 2020-09-21 DIAGNOSIS — G629 Polyneuropathy, unspecified: Secondary | ICD-10-CM | POA: Diagnosis not present

## 2020-09-21 DIAGNOSIS — Z7189 Other specified counseling: Secondary | ICD-10-CM | POA: Diagnosis not present

## 2020-09-21 DIAGNOSIS — R0602 Shortness of breath: Secondary | ICD-10-CM | POA: Diagnosis not present

## 2020-09-27 ENCOUNTER — Telehealth: Payer: Self-pay

## 2020-09-27 NOTE — Telephone Encounter (Signed)
Patient was called back and scheduled an office visit in order to bring his machine in and complete a download.

## 2020-09-27 NOTE — Telephone Encounter (Signed)
Pt needed to be seen due to last visit over a year ago

## 2020-09-27 NOTE — Telephone Encounter (Signed)
We received a phone call from Paul [name] concerned that his CPAP is not working correctly. He is requesting a call back to discuss.

## 2020-10-11 ENCOUNTER — Telehealth: Payer: Self-pay | Admitting: *Deleted

## 2020-10-11 NOTE — Telephone Encounter (Signed)
Received call to triage from checkout staff patient in office complaining of chest pain and SOB.      Spoke to patient, patient reports sudden onset chest pain x 3 days ago.  Reports this happened "out of no where".   Reports taking 1 NTG and resting and this resolved pain.    Reports for the last few weeks he is unable to walk across the room without feeling extremely fatigued.  Patient seems SOB but reports this is his baseline.    No acute distress at current.  Patient also reports being out of one of his insulins this weekend as the pharmacy was closed.    Last OV 2015 with Dr. Ellyn Hack Hx: CAD s/p PCI in 2013, HTN, HLD, OSA, COPD.  Advised due to acute onset of symptoms/history to proceed to ER for evaluation.   Offered to call EMS, patient refused.   Patient agreed to proceed to ER.  1st new patient appt scheduled.

## 2020-10-20 DIAGNOSIS — Z20822 Contact with and (suspected) exposure to covid-19: Secondary | ICD-10-CM | POA: Diagnosis not present

## 2020-11-11 DIAGNOSIS — E118 Type 2 diabetes mellitus with unspecified complications: Secondary | ICD-10-CM | POA: Diagnosis not present

## 2020-11-11 DIAGNOSIS — R0602 Shortness of breath: Secondary | ICD-10-CM | POA: Diagnosis not present

## 2020-11-11 DIAGNOSIS — Z1152 Encounter for screening for COVID-19: Secondary | ICD-10-CM | POA: Diagnosis not present

## 2020-11-11 DIAGNOSIS — J40 Bronchitis, not specified as acute or chronic: Secondary | ICD-10-CM | POA: Diagnosis not present

## 2020-11-11 DIAGNOSIS — R051 Acute cough: Secondary | ICD-10-CM | POA: Diagnosis not present

## 2020-11-12 DIAGNOSIS — R5383 Other fatigue: Secondary | ICD-10-CM | POA: Diagnosis not present

## 2020-11-12 DIAGNOSIS — I739 Peripheral vascular disease, unspecified: Secondary | ICD-10-CM | POA: Diagnosis not present

## 2020-11-12 DIAGNOSIS — D72829 Elevated white blood cell count, unspecified: Secondary | ICD-10-CM | POA: Diagnosis not present

## 2020-11-12 DIAGNOSIS — G4733 Obstructive sleep apnea (adult) (pediatric): Secondary | ICD-10-CM | POA: Diagnosis not present

## 2020-11-12 DIAGNOSIS — I10 Essential (primary) hypertension: Secondary | ICD-10-CM | POA: Diagnosis not present

## 2020-11-12 DIAGNOSIS — E1151 Type 2 diabetes mellitus with diabetic peripheral angiopathy without gangrene: Secondary | ICD-10-CM | POA: Diagnosis not present

## 2020-11-12 DIAGNOSIS — J449 Chronic obstructive pulmonary disease, unspecified: Secondary | ICD-10-CM | POA: Diagnosis not present

## 2020-11-12 DIAGNOSIS — R5382 Chronic fatigue, unspecified: Secondary | ICD-10-CM | POA: Diagnosis not present

## 2020-11-25 DIAGNOSIS — I129 Hypertensive chronic kidney disease with stage 1 through stage 4 chronic kidney disease, or unspecified chronic kidney disease: Secondary | ICD-10-CM | POA: Diagnosis not present

## 2020-12-01 ENCOUNTER — Encounter: Payer: Self-pay | Admitting: Neurology

## 2020-12-06 ENCOUNTER — Encounter: Payer: Self-pay | Admitting: Neurology

## 2020-12-06 ENCOUNTER — Ambulatory Visit (INDEPENDENT_AMBULATORY_CARE_PROVIDER_SITE_OTHER): Payer: Medicare Other | Admitting: Neurology

## 2020-12-06 VITALS — BP 130/68 | HR 73 | Ht 65.0 in | Wt 187.0 lb

## 2020-12-06 DIAGNOSIS — G894 Chronic pain syndrome: Secondary | ICD-10-CM | POA: Diagnosis not present

## 2020-12-06 DIAGNOSIS — J449 Chronic obstructive pulmonary disease, unspecified: Secondary | ICD-10-CM

## 2020-12-06 DIAGNOSIS — I739 Peripheral vascular disease, unspecified: Secondary | ICD-10-CM | POA: Diagnosis not present

## 2020-12-06 DIAGNOSIS — K219 Gastro-esophageal reflux disease without esophagitis: Secondary | ICD-10-CM | POA: Diagnosis not present

## 2020-12-06 DIAGNOSIS — D72829 Elevated white blood cell count, unspecified: Secondary | ICD-10-CM | POA: Diagnosis not present

## 2020-12-06 DIAGNOSIS — I2510 Atherosclerotic heart disease of native coronary artery without angina pectoris: Secondary | ICD-10-CM

## 2020-12-06 DIAGNOSIS — R2681 Unsteadiness on feet: Secondary | ICD-10-CM | POA: Diagnosis not present

## 2020-12-06 DIAGNOSIS — G4733 Obstructive sleep apnea (adult) (pediatric): Secondary | ICD-10-CM

## 2020-12-06 DIAGNOSIS — E1151 Type 2 diabetes mellitus with diabetic peripheral angiopathy without gangrene: Secondary | ICD-10-CM | POA: Diagnosis not present

## 2020-12-06 DIAGNOSIS — M25512 Pain in left shoulder: Secondary | ICD-10-CM | POA: Diagnosis not present

## 2020-12-06 DIAGNOSIS — M25511 Pain in right shoulder: Secondary | ICD-10-CM | POA: Diagnosis not present

## 2020-12-06 DIAGNOSIS — G629 Polyneuropathy, unspecified: Secondary | ICD-10-CM | POA: Diagnosis not present

## 2020-12-06 DIAGNOSIS — Z9861 Coronary angioplasty status: Secondary | ICD-10-CM | POA: Diagnosis not present

## 2020-12-06 DIAGNOSIS — R5382 Chronic fatigue, unspecified: Secondary | ICD-10-CM | POA: Diagnosis not present

## 2020-12-06 DIAGNOSIS — I10 Essential (primary) hypertension: Secondary | ICD-10-CM | POA: Diagnosis not present

## 2020-12-06 DIAGNOSIS — F329 Major depressive disorder, single episode, unspecified: Secondary | ICD-10-CM | POA: Diagnosis not present

## 2020-12-06 DIAGNOSIS — J189 Pneumonia, unspecified organism: Secondary | ICD-10-CM

## 2020-12-06 NOTE — Progress Notes (Signed)
SLEEP MEDICINE CLINIC    Provider:  Larey Seat, MD  Primary Care Physician:  Leanna Battles, MD [place] [address]     Referring Provider: Leanna Battles, [place],  [place] [address]          Chief Complaint according to patient   Patient presents with:     New Patient (Initial Visit)           HISTORY OF PRESENT ILLNESS:  Paul [name] is a 70 y.o. year old White or Caucasian male patient seen here as a new patient upon re-referral on 12/06/2020 from PCP.  Chief concern according to patient :  Patient has been sent to pain management and pulmonary care- post Covid Pneumonia.  Here for no longer tolerating CPAP which was initiated in 2017 , and patient has not been seen here over 3 years.    I have the pleasure of seeing Paul [name] today, a right-handed White or Caucasian male with OSA and COPD disorder, who   has a past medical history of CAD S/P percutaneous coronary angioplasty -- D1, Xience Xpedition DES 2.5 mm x 33 mm (11/24/2011), OSA and COPD (chronic obstructive pulmonary disease) ([place]), Diabetes mellitus without complication ([place]), Essential hypertension (11/24/2011), Exertional dyspnea, chronic, Gout, History of Unstable angina (11/24/2011), CHRONIC PAIN SYNDROME-Hyperlipidemia with target LDL less than 70 (11/24/2011), Obesity (BMI 30.0-34.9), and OSA (obstructive sleep apnea), uses oxygen at home did not tolerate cpap (11/24/2011). Referred to pulmonary, couldn't use tolerate the CPAP for the last month.   I had last seen this patient on 10-10-2016.  His initial consultation was in December 2017 he underwent a baseline polysomnography on 5 December which resulted in an AHI of only 13.1 rather mild apnea which was strongly accentuated during REM sleep.  During REM sleep AHI became 61.9/h.  The patient had prolonged periods of hypoxia for a total time of 249 minutes.  Comorbidity was decided as possible COPD and known coronary artery  disease.  He was placed on a CPAP and a titration study in January 2018 titrated to 9 cmH2O but needed 1 L of oxygen.  He continued oxygen at home and initially used a fullface mask by [employer] the so-called F 20 large.  He does have facial hair which does affect the fit.  Residual AHI in 2018 was only 0.2/h.  Epworth sleepiness score has decreased to only 3 points.  The patient must have had a sleep study before because in his initial consultation with me he reported that he had been unable to tolerate CPAP.  He had anyway chronic insomnia due to chronic pain left shoulder gout chest pain hypertension coronary artery and he was followed by Dr. Nelma Rothman from pulmonary.  Hypertension was of been poorly controlled he continued at the time to drink caffeinated beverages.  And he was still a daily smoker and when I first met him.  His chief complaint was :"I cannot sleep - I cough."    Social history:  Patient is  retired from Radiation protection practitioner and retired about 5 years ago.  At the time he also quit smoking.  And lives in a household alone.  Family status is single-, with 2 children,.no pets.   Tobacco use patient had a 50-pack-year history but he quit 5 years ago.  ETOH use infrequently 1 drink a week or 1 drink a months.,  Caffeine intake in form of Coffee(1 a day ) Soda(  Mt. Dew 3  a day) Tea ( /) or energy drinks. Regular exercise :none .         Sleep habits are as follows: The patient's dinner time is between 7-8 PM. The patient goes to bed at 11 PM and continues to sleep for 6 hours, wakes for 3 bathroom breaks, the first time at 1 AM.   He is sleeping poorly because of pain he reports. The preferred sleep position is variable, with the support of 1 pillow.  Dreams are reportedly rare 8.30-9  AM is the usual rise time. The patient wakes up spontaneously  He reports not feeling refreshed or restored in AM, with symptoms such as dry mouth, no morning headaches, Naps are taken frequently,  lasting from 30 to 45 minutes and he reports waking up from the dryness of his mouth.  He feels parched which indicates that he is a mouth breather and he may be snoring.  Review of Systems: Out of a complete 14 system review, the patient complains of only the following symptoms, and all other reviewed systems are negative.:  Fatigue, sleepiness , snoring, fragmented sleep, insomnia due to chronic pain, longstanding.  Habitual mouth breather.    Longstanding history of hypoxia.  Spotty compliance with CPAP recently.  Chronic chest pain  Depression      DM -   How likely are you to doze in the following situations: 0 = not likely, 1 = slight chance, 2 = moderate chance, 3 = high chance   Sitting and Reading? Watching Television? Sitting inactive in a public place (theater or meeting)? As a passenger in a car for an hour without a break? Lying down in the afternoon when circumstances permit? Sitting and talking to someone? Sitting quietly after lunch without alcohol? In a car, while stopped for a few minutes in traffic?   Total = 6/ 24 points - but almost daily naps.   FSS endorsed at 46/ 63 points.   Social History   Socioeconomic History   Marital status: Divorced    Spouse name: Not on file   Number of children: Not on file   Years of education: Not on file   Highest education level: Not on file  Occupational History   Not on file  Tobacco Use   Smoking status: Former    Packs/day: 1.00    Years: 40.00    Pack years: 40.00    Types: Cigarettes    Quit date: 06/10/2016    Years since quitting: 4.4   Smokeless tobacco: Never   Tobacco comments:    02/18/14- smokes occ cig maybe 3 x per wk  Substance and Sexual Activity   Alcohol use: Yes    Alcohol/week: 0.0 standard drinks    Comment: social 2-3 drink a week   Drug use: No   Sexual activity: Not Currently  Other Topics Concern   Not on file  Social History Narrative   He is a 70 y.o. divorced father of 11,  grandfather 1.   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   He quit smoking in August 2013, after smoking a pack a day for roughly 40 years.   He drinks socially 2-3 drinks a week only.   He does not get routine exercise, mostly due to 2 fatigue and dyspnea. Otherwise been relatively sedentary.   Social Determinants of Health   Financial Resource Strain:  Not on file  Food Insecurity: Not on file  Transportation Needs: Not on file  Physical Activity: Not on file  Stress: Not on file  Social Connections: Not on file    Family History  Problem Relation Age of Onset   Heart disease Sister    Heart attack Brother    Emphysema Father        smoked   Aneurysm Father     Past Medical History:  Diagnosis Date   CAD S/P percutaneous coronary angioplasty -- D1, Xience Xpedition DES 2.5 mm x 33 mm 11/24/2011   a) Mild-to-moderate 30-40% lesions in the RCA, LAD and Circumflex. b) CULPRIT LESION: long tubular 70-80% lesion in D1 with FFR of 0.7 --> PCI w/ Xience Xpedition DES 2.5 mm x 30 mm (2.65 MM); c) Lexiscan Myoview 11/2013: No Ischemia or Infarct (Inferior Gut Attenuation) EF 63%.   COPD (chronic obstructive pulmonary disease) ([place])    "don't have full case of it; I'm right there at it"   Diabetes mellitus without complication ([place])    Essential hypertension 11/24/2011   Exertional dyspnea, chronic    Gout    History of Unstable angina 11/24/2011   Referred for cardiac catheterization   Hyperlipidemia with target LDL less than 70 11/24/2011   Obesity (BMI 30.0-34.9)    OSA (obstructive sleep apnea), uses oxygen at home did not tolerate cpap 11/24/2011    Past Surgical History:  Procedure Laterality Date   CERVICAL SPINE SURGERY  2012   CORONARY ANGIOPLASTY WITH STENT PLACEMENT  11/23/2011   "1; first one"   LEFT HEART CATHETERIZATION WITH CORONARY  ANGIOGRAM N/A 11/23/2011   Procedure: LEFT HEART CATHETERIZATION WITH CORONARY ANGIOGRAM;  Surgeon: Leonie Man, MD;  Location: [facility] CATH LAB;  Service: Cardiovascular;  Laterality: N/A;     Current Outpatient Medications on File Prior to Visit  Medication Sig Dispense Refill   acetaminophen (TYLENOL) 650 MG CR tablet Take 1,950 mg by mouth daily as needed for pain.      allopurinol (ZYLOPRIM) 100 MG tablet Take 100 mg by mouth daily.     amLODipine (NORVASC) 5 MG tablet Take 5 mg by mouth daily.     benzonatate (TESSALON) 100 MG capsule Take by mouth 3 (three) times daily as needed for cough.     clopidogrel (PLAVIX) 75 MG tablet Take 1 tablet (75 mg total) by mouth daily. 30 tablet 1   diclofenac Sodium (VOLTAREN) 1 % GEL Apply 2 g topically 2 (two) times daily as needed (shoulder pain).     Ergocalciferol (VITAMIN D2 PO) Take 5,000 Units by mouth daily.     furosemide (LASIX) 20 MG tablet Take 20 mg by mouth daily.     gabapentin (NEURONTIN) 300 MG capsule Take 600 mg by mouth 2 (two) times daily.     Insulin Glargine (BASAGLAR KWIKPEN) 100 UNIT/ML Inject 40 Units into the skin daily.     insulin lispro (HUMALOG) 100 UNIT/ML KwikPen Inject 6-10 Units into the skin See admin instructions. 10 units in the morning, 6 units with lunch, and 6 units with supper     loratadine (CLARITIN) 10 MG tablet Take 1 tablet (10 mg total) by mouth daily. 30 tablet 0   metFORMIN (GLUCOPHAGE-XR) 500 MG 24 hr tablet Take 1,000 mg by mouth in the morning and at bedtime.     metoprolol tartrate (LOPRESSOR) 25 MG tablet Take 1 tablet (25 mg total) by mouth 2 (two) times daily. 60 tablet 0   omeprazole (  PRILOSEC) 40 MG capsule Take 40 mg by mouth daily.     Tetrahydrozoline HCl (VISINE EXTRA OP) Place 1 drop into both eyes as needed (itching).     traMADol (ULTRAM) 50 MG tablet Take 50 mg by mouth every 6 (six) hours as needed.     umeclidinium-vilanterol (ANORO ELLIPTA) 62.5-25 MCG/INH AEPB Inhale 1 puff into the  lungs daily. 1 each 12   zolpidem (AMBIEN) 10 MG tablet Take 10 mg by mouth at bedtime.      nitroGLYCERIN (NITROSTAT) 0.4 MG SL tablet Place 1 tablet (0.4 mg total) under the tongue every 5 (five) minutes as needed for chest pain. 25 tablet 4   No current facility-administered medications on file prior to visit.    Allergies  Allergen Reactions   Codeine Itching   Duloxetine Hcl Other (See Comments)    Took at night and still felt very lethargic the following day.  (malaise)    Physical exam:  Today's Vitals   12/06/20 1323  BP: 130/68  Pulse: 73  SpO2: 97%  Weight: 187 lb (84.8 kg)  Height: 5' 5"  (1.651 m)   Body mass index is 31.12 kg/m.   Wt Readings from Last 3 Encounters:  12/06/20 187 lb (84.8 kg)  02/23/20 186 lb (84.4 kg)  06/10/18 178 lb 12.8 oz (81.1 kg)     Ht Readings from Last 3 Encounters:  12/06/20 5' 5"  (1.651 m)  06/10/18 5' 5"  (9.449 m)  67/59/16 5' 6"  (1.676 m)      General: The patient is awake, alert and appears not in acute distress. The patient is not well groomed. Head: Normocephalic, atraumatic.  Neck is supple. Mallampati 3,  neck circumference:17 inches . Lacquered tongue. Redness of soft tissue. GERD ?   Nasal airflow barely patent.  Retrognathia is not seen.  Dental status: poor.   Cardiovascular:  Regular rate and cardiac rhythm by pulse,  without distended neck veins. Respiratory: Lungs are clear to auscultation.  Skin:  Without evidence of ankle edema, or rash. Trunk: The patient's posture is erect.   Neurologic exam : The patient is awake and alert, oriented to place and time.   Memory subjective described as intact.  Attention span & concentration ability appears normal.  Speech is fluent,  without  dysarthria, dysphonia or aphasia.  Mood and affect are appropriate.   Cranial nerves: no loss of smell or taste reported  Pupils are equal in size and shape- round, 3 mm and briskly reactive to light. Funduscopic exam deferred.   Extraocular movements in vertical and horizontal planes were intact and without nystagmus.  No Diplopia.normal accomodation.  Visual fields by finger perimetry are intact. Hearing was decreased  finger rubbing.    Facial sensation intact to fine touch.  Facial motor strength is symmetric and tongue and uvula move midline.  Neck ROM : rotation, tilt and flexion extension were normal for age and shoulder shrug was symmetrical.    Motor exam:  Symmetric bulk, tone and ROM.   Normal tone without cog wheeling, symmetric grip strength .   Sensory:  Fine touch, pinprick and vibration were tested- he can't feel vibration in either knee nor ankles. Reports inability to feel temperature.  Proprioception tested in the upper extremities was normal.   Coordination: Rapid alternating movements in the fingers/hands were of normal speed.  The Finger-to-nose maneuver was intact without evidence of ataxia, dysmetria or tremor. With closed eyes , the patient started swaying while seated , back and forth.  Gait and station: Patient could rise unassisted from a seated position, walked without assistive device. He has a cane and doesn't use it.  Toe and heel walk were deferred.  Deep tendon reflexes: in the  upper and lower extremities are symmetric and intact.  Babinski response was deferred.   The patient had been found to be hypoxemic in the sleep lab, but apparently had recently some kind of follow-up through primary care Dr. Boykin [name] he states that he was he states that he was told that he no longer needs the oxygen or does not qualify for oxygen.  His machine is not yet 70 years old and would not be replaceable at this time.  Unless broken!  I reviewed his baseline polysomnogram his CPAP titration and his recent compliance data as well as his Epworth sleepiness score and fatigue some severity.  The patient used the machine only 47% of the last 30days he did factor quit at the beginning of August.   Stating that he sometimes swallowed air.  The average user time on days used was 4 hours 15 minutes at a set pressure of 9 cmH2O with 3 cm EPR and his residual AHI was 1.4.  His 95th percentile air leak was 22 L/min.  There were no central apneas were observed.    After spending a total time of 45  minutes face to face and additional 15 minutes of time for review of laboratory studies,  personal review of imaging studies, reports and results of other testing and review of referral information / records as far as provided in visit, I have established the following assessments:  1) known OSA, mild and mainly treated with CPAP because of co-morbities, including CAD and COPD- he is hypoxic , and CPAP allowed a way to add oxygen in the past.   2) patient has been informed by his primary care physician that he no longer qualifies for oxygen this may be able to I do not have a documentation of a recent pulse oximetry for baseline available here in his referral.  I need another sleep test to establish how much apnea we treat at this time.   3) His compliance is poor because he reports that it hurts his chest wall to use CPAP and that cardiology and pulmonology have not found a reason for this chest wall pain.  He has been referred to chronic pain management.  4) neuropathy- DM related, causing higher pain and fall risk. The patient is still on narcotics according to current medication list- will not prescribe tramadol or ambien from here.    My Plan is to proceed with:  1)baseline study repeat- HST or in lab, he would not likely fulfill SPLIT criteria but may be hypoxia can satisfy medicare guidelines for oxygen.  I order both types, HST and PSG   2) Paul [name] has been clearly told that we do not treat his chronic pain.  3) This was a sleep CONSULTATION and I am concentraying on the related findings and tests.   I would like to thank Leanna Battles, MD and Leanna Battles, [place] [place],  [place] [address] for allowing me to meet with and to take care of this pleasant patient.   In short, Paul [name] is presenting with untreated OSA stating his CPAP setting is no longer comfortable. , a  70 year old CPAP with a setting of 9 cm water.  Insomnia symptoms have been chronic and related to pain ( PAIN MANAGEMENT  not done through sleep clinic or [facility] ).  I plan to follow up either personally or through our NP within 4 month.   CC: I will share my notes with PCP, Pulmonology, cardiology and Pain clinic. Marland Kitchen  Electronically signed by: Larey Seat, MD 12/06/2020 1:45 PM  [facility] certified by The [employer] of Sleep Medicine and Diplomate of the [employer] of Sleep Medicine. Board certified In Neurology through the [place], Fellow of the [employer] of Neurology. Medical Director of [employer].

## 2020-12-06 NOTE — Patient Instructions (Signed)
Quality Sleep Information, Adult Quality sleep is important for your mental and physical health. It also improves your quality of life. Quality sleep means you: Are asleep for most of the time you are in bed. Fall asleep within 30 minutes. Wake up no more than once a night.  Are awake for no longer than 20 minutes if you do wake up during the night. Most adults need 7-8 hours of quality sleep each night. How can poor sleep affect me? If you do not get enough quality sleep, you may have: Mood swings. Daytime sleepiness. Confusion. Decreased reaction time. Sleep disorders, such as insomnia and sleep apnea. Difficulty with: Solving problems. Coping with stress. Paying attention. These issues may affect your performance and productivity at work, school, and at home. Lack of sleep may also put you at higher risk for accidents, suicide,and risky behaviors. If you do not get quality sleep you may also be at higher risk for several health problems, including: Infections. Type 2 diabetes. Heart disease. High blood pressure. Obesity. Worsening of long-term conditions, like arthritis, kidney disease, depression, Parkinson's disease, and epilepsy. What actions can I take to get more quality sleep?     Stick to a sleep schedule. Go to sleep and wake up at about the same time each day. Do not try to sleep less on weekdays and make up for lost sleep on weekends. This does not work. Try to get about 30 minutes of exercise on most days. Do not exercise 2-3 hours before going to bed. Limit naps during the day to 30 minutes or less. Do not use any products that contain nicotine or tobacco, such as cigarettes or e-cigarettes. If you need help quitting, ask your health care provider. Do not drink caffeinated beverages for at least 8 hours before going to bed. Coffee, tea, and some sodas contain caffeine. Do not drink alcohol close to bedtime. Do not eat large meals close to bedtime. Do not take naps  in the late afternoon. Try to get at least 30 minutes of sunlight every day. Morning sunlight is best. Make time to relax before bed. Reading, listening to music, or taking a hot bath promotes quality sleep. Make your bedroom a place that promotes quality sleep. Keep your bedroom dark, quiet, and at a comfortable room temperature. Make sure your bed is comfortable. Take out sleep distractions like TV, a computer, smartphone, and bright lights. If you are lying awake in bed for longer than 20 minutes, get up and do a relaxing activity until you feel sleepy. Work with your health care provider to treat medical conditions that may affect sleeping, such as: Nasal obstruction. Snoring. Sleep apnea and other sleep disorders. Talk to your health care provider if you think any of your prescription medicines may cause you to have difficulty falling or staying asleep. If you have sleep problems, talk with a sleep consultant. If you think you have a sleep disorder, talk with your health care provider about getting evaluated by a specialist. Where to find more information [place] website: https://sleepfoundation.org National Heart, Lung, and [place] ([place]): http://www.saunders.info/.pdf Centers for Disease Control and Prevention (CDC): LearningDermatology.pl Contact a health care provider if you: Have trouble getting to sleep or staying asleep. Often wake up very early in the morning and cannot get back to sleep. Have daytime sleepiness. Have daytime sleep attacks of suddenly falling asleep and sudden muscle weakness (narcolepsy). Have a tingling sensation in your legs with a strong urge to move your legs (  restless legs syndrome). Stop breathing briefly during sleep (sleep apnea). Think you have a sleep disorder or are taking a medicine that is affecting your quality of sleep. Summary Most adults need 7-8 hours of quality sleep each  night. Getting enough quality sleep is an important part of health and well-being. Make your bedroom a place that promotes quality sleep and avoid things that may cause you to have poor sleep, such as alcohol, caffeine, smoking, and large meals. Talk to your health care provider if you have trouble falling asleep or staying asleep. This information is not intended to replace advice given to you by your health care provider. Make sure you discuss any questions you have with your healthcare provider. Document Revised: 07/11/2017 Document Reviewed: 07/11/2017 Elsevier Patient Education  2022 [place] for Sleep Apnea  Sleep apnea is a condition in which breathing pauses or becomes shallow during sleep. Sleep apnea screening is a test to determine if you are at risk for sleep apnea. The test includes a series of questions. It will only takes a few minutes. Your health care provider may ask you to have this test in preparationfor surgery or as part of a physical exam. What are the symptoms of sleep apnea? Common symptoms of sleep apnea include: Snoring. Waking up often at night. Daytime sleepiness. Pauses in breathing. Choking or gasping during sleep. Irritability. Forgetfulness. Trouble thinking clearly. Depression. Personality changes. Most people with sleep apnea do not know that they have it. What are the advantages of sleep apnea screening? Getting screened for sleep apnea can help: Ensure your safety. It is important for your health care providers to know whether or not you have sleep apnea, especially if you are having surgery or have other long-term (chronic) health conditions. Improve your health and allow you to get a better night's rest. Restful sleep can help you: Have more energy. Lose weight. Improve high blood pressure. Improve diabetes management. Prevent stroke. Prevent car accidents. What happens during the screening? Screening usually includes being  asked a list of questions about your sleep quality. Some questions you may be asked include: Do you snore? Is your sleep restless? Do you have daytime sleepiness? Has a partner or spouse told you that you stop breathing during sleep? Have you had trouble concentrating or memory loss? What is your age? What is your neck circumference? To measure your neck, keep your back straight and gently wrap the tape measure around your neck. Put the tape measure at the middle of your neck, between your chin and collarbone. What is your sex assigned at birth? Do you have or are you being treated for high blood pressure? If your screening test is positive, you are at risk for the condition. Furthertesting may be needed to confirm a diagnosis of sleep apnea. Where to find more information You can find screening tools online or at your health care clinic. For more information about sleep apnea screening and healthy sleep, visit these websites: Centers for Disease Control and Prevention: http://www.wolf.info/ American Sleep Apnea Association: www.sleepapnea.org Contact a health care provider if: You think that you may have sleep apnea. Summary Sleep apnea screening can help determine if you are at risk for sleep apnea. It is important for your health care providers to know whether or not you have sleep apnea, especially if you are having surgery or have other chronic health conditions. You may be asked to take a screening test for sleep apnea in preparation for surgery or as part of  a physical exam. This information is not intended to replace advice given to you by your health care provider. Make sure you discuss any questions you have with your healthcare provider. Document Revised: 03/12/2020 Document Reviewed: 03/12/2020 Elsevier Patient Education  2022 [employer].

## 2020-12-10 NOTE — Progress Notes (Signed)
Cardiology Office Note   Date:  12/13/2020   ID:  Shirl Anastasiou, DOB [birth year], MRN [id number]  PCP:  Leanna Battles, MD  Cardiologist:   Carlia Bomkamp [place], MD   Chief Complaint  Patient presents with   Chest Pain   Shortness of Breath      History of Present Illness: Ardon Duggal is a 70 y.o. male who is seen at the request of Dr Philip Aspen for evaluation of dyspnea and chest pain. He has a known history of CAD. Last seen by Dr Ellyn Hack in October 2015. He has a remote history of CAD with PCI of the first diagonal with DES in August 2013. He has a history of COPD, obesity with OSA, HTN, HLD, and tobacco abuse.  He is also diabetic on insulin. When last evaluated in 2015 he complained of dyspnea. Myoview study showed no ischemia and Echo showed normal LV function. He was weaned off beta blocker due to COPD.   He was admitted in Jan 2020 with acute respiratory failure requiring intubation and RLL PNA. Hospital course complicated by bacteremia, encephalopathy, and prolonged debility.   Patient states that over the past 6-8 months he develops chest pain precordial with any activity. Takes 30 minutes for symptoms to resolve. Some improvement with sl Ntg. Complains of SOB. States because of symptoms he is not able to do anything and is very sedentary. Quality of life is poor. He is now on metoprolol and amlodipine. Diabetic control has been poor and he is on insulin. Reports he quit smoking 6-7 yrs ago. He is not on statin therapy. Is on Plavix. States he was told not to take ASA but no bleeding history.     Past Medical History:  Diagnosis Date   CAD S/P percutaneous coronary angioplasty -- D1, Xience Xpedition DES 2.5 mm x 33 mm 11/24/2011   a) Mild-to-moderate 30-40% lesions in the RCA, LAD and Circumflex. b) CULPRIT LESION: long tubular 70-80% lesion in D1 with FFR of 0.7 --> PCI w/ Xience Xpedition DES 2.5 mm x 30 mm (2.65 MM); c) Lexiscan Myoview 11/2013: No Ischemia or Infarct (Inferior Gut  Attenuation) EF 63%.   COPD (chronic obstructive pulmonary disease) ([place])    "don't have full case of it; I'm right there at it"   Diabetes mellitus without complication ([place])    Essential hypertension 11/24/2011   Exertional dyspnea, chronic    Gout    History of Unstable angina 11/24/2011   Referred for cardiac catheterization   Hyperlipidemia with target LDL less than 70 11/24/2011   Obesity (BMI 30.0-34.9)    OSA (obstructive sleep apnea), uses oxygen at home did not tolerate cpap 11/24/2011    Past Surgical History:  Procedure Laterality Date   CERVICAL SPINE SURGERY  2012   CORONARY ANGIOPLASTY WITH STENT PLACEMENT  11/23/2011   "1; first one"   LEFT HEART CATHETERIZATION WITH CORONARY ANGIOGRAM N/A 11/23/2011   Procedure: LEFT HEART CATHETERIZATION WITH CORONARY ANGIOGRAM;  Surgeon: Leonie Man, MD;  Location: [facility] CATH LAB;  Service: Cardiovascular;  Laterality: N/A;     Current Outpatient Medications  Medication Sig Dispense Refill   acetaminophen (TYLENOL) 650 MG CR tablet Take 1,950 mg by mouth daily as needed for pain.      albuterol (PROVENTIL) (2.5 MG/3ML) 0.083% nebulizer solution take 3 mls (2.'5mg'$ ) total by neb q4h PRN for wheezing or SOB.     allopurinol (ZYLOPRIM) 100 MG tablet Take 100 mg by mouth daily.     amLODipine (  NORVASC) 5 MG tablet Take 5 mg by mouth daily.     benzonatate (TESSALON) 100 MG capsule Take by mouth 3 (three) times daily as needed for cough.     clopidogrel (PLAVIX) 75 MG tablet Take 1 tablet (75 mg total) by mouth daily. 30 tablet 1   diclofenac Sodium (VOLTAREN) 1 % GEL Apply 2 g topically 2 (two) times daily as needed (shoulder pain).     Ergocalciferol (VITAMIN D2 PO) Take 5,000 Units by mouth daily.     furosemide (LASIX) 20 MG tablet Take 20 mg by mouth daily.     furosemide (LASIX) 20 MG tablet Take 1 tablet by mouth daily.     gabapentin (NEURONTIN) 300 MG capsule Take 600 mg by mouth 2 (two) times daily.     Insulin Glargine (BASAGLAR  KWIKPEN) 100 UNIT/ML Inject 40 Units into the skin daily.     insulin lispro (HUMALOG) 100 UNIT/ML KwikPen Inject 6-10 Units into the skin See admin instructions. 10 units in the morning, 6 units with lunch, and 6 units with supper     loratadine (CLARITIN) 10 MG tablet Take 1 tablet (10 mg total) by mouth daily. 30 tablet 0   metFORMIN (GLUCOPHAGE-XR) 500 MG 24 hr tablet Take 1,000 mg by mouth in the morning and at bedtime.     metoprolol tartrate (LOPRESSOR) 25 MG tablet Take 1 tablet (25 mg total) by mouth 2 (two) times daily. 60 tablet 0   omeprazole (PRILOSEC) 40 MG capsule Take 40 mg by mouth daily.     Tetrahydrozoline HCl (VISINE EXTRA OP) Place 1 drop into both eyes as needed (itching).     traMADol (ULTRAM) 50 MG tablet Take 50 mg by mouth every 6 (six) hours as needed.     traMADol (ULTRAM) 50 MG tablet 1 tablet as needed - per orthopedics     umeclidinium-vilanterol (ANORO ELLIPTA) 62.5-25 MCG/INH AEPB Inhale 1 puff into the lungs daily. 1 each 12   Vitamin D, Ergocalciferol, (DRISDOL) 1.25 MG (50000 UNIT) CAPS capsule Take 50,000 Units by mouth once a week.     nitroGLYCERIN (NITROSTAT) 0.4 MG SL tablet Place 1 tablet (0.4 mg total) under the tongue every 5 (five) minutes as needed for chest pain. 25 tablet 4   No current facility-administered medications for this visit.    Allergies:   Codeine and Duloxetine hcl    Social History:  The patient  reports that he quit smoking about 4 years ago. His smoking use included cigarettes. He has a 40.00 pack-year smoking history. He has never used smokeless tobacco. He reports current alcohol use. He reports that he does not use drugs.   Family History:  The patient's family history includes Aneurysm in his father; Emphysema in his father; Heart attack in his brother; Heart disease in his sister.    ROS:  Please see the history of present illness.   Otherwise, review of systems are positive for none.   All other systems are reviewed and  negative.    PHYSICAL EXAM: VS:  BP 126/77   Pulse 71   Ht '5\' 5"'$  (1.651 m)   Wt 187 lb 9.6 oz (85.1 kg)   SpO2 93%   BMI 31.22 kg/m  , BMI Body mass index is 31.22 kg/m. GEN: Well nourished, short, stocky, in no acute distress HEENT: normal Neck: no JVD, carotid bruits, or masses Cardiac: RRR; no murmurs, rubs, or gallops,no edema  Respiratory:  clear to auscultation bilaterally, normal work of breathing  GI: soft, nontender, nondistended, + BS MS: no deformity or atrophy Skin: warm and dry, no rash Neuro:  Strength and sensation are intact Psych: euthymic mood, full affect   EKG:  EKG is ordered today. The ekg ordered today demonstrates NSR with rate 71. Nonspecific ST T abnormality. I have personally reviewed and interpreted this study.    Recent Labs: 07/14/2020: BUN 11; Creatinine, Ser 0.88; Hemoglobin 13.8; Platelets 181; Potassium 4.2; Sodium 132; TSH 2.852   Dated 6/7//22: A1c 11.2%. glucose 298. CMET normal.   Lipid Panel    Component Value Date/Time   CHOL 165 11/23/2011 0846   TRIG 216 (H) 04/24/2018 0634   HDL 25 (L) 11/23/2011 0846   CHOLHDL 6.6 11/23/2011 0846   VLDL 29 11/23/2011 0846   LDLCALC 111 (H) 11/23/2011 0846      Wt Readings from Last 3 Encounters:  12/13/20 187 lb 9.6 oz (85.1 kg)  12/06/20 187 lb (84.8 kg)  02/23/20 186 lb (84.4 kg)      Other studies Reviewed: Additional studies/ records that were reviewed today include:   Echo 01/22/14: Study Conclusions   - Left ventricle: The cavity size was normal. Systolic function was    normal. The estimated ejection fraction was in the range of 55%    to 60%. Wall motion was normal; there were no regional wall    motion abnormalities. There was an increased relative    contribution of atrial contraction to ventricular filling.    Doppler parameters are consistent with abnormal left ventricular    relaxation (grade 1 diastolic dysfunction).  - Aortic valve: Trileaflet; normal thickness,  mildly calcified    leaflets.  - Right ventricle: The cavity size was mildly dilated. Wall    thickness was normal.    Myoview 12/04/13: Nuclear Med Background Indication for Stress Test:  Evaluation for Ischemia and Stent Patency History:  COPD and CAD;STENT/PTCA-11/2011;Unstable angina;No prior NUC MPI for comparison;ECHO on 05/15/2012 Cardiac Risk Factors: Family History - CAD, History of Smoking, Hypertension, Lipids and Obesity  Symptoms:  Chest Pain, Fatigue, Light-Headedness and SOB     Nuclear Pre-Procedure Caffeine/Decaff Intake:  7:00pm NPO After: 5:00am   IV Site: R Forearm  IV 0.9% NS with Angio Cath:  22g  Chest Size (in):  46"   IV Started by: Rolene Course, RN  Height: '5\' 5"'$  (1.651 m)  Cup Size: n/a  BMI:  Body mass index is 33.12 kg/(m^2). Weight:  199 lb (90.266 kg)    Tech Comments:  n/a      Nuclear Med Study 1 or 2 day study: 1 day  Stress Test Type:  [place] Provider:  Minus Breeding, MD    Resting Radionuclide: Technetium 35mSestamibi  Resting Radionuclide Dose: 10.5 mCi   Stress Radionuclide:  Technetium 970mestamibi  Stress Radionuclide Dose: 30.7 mCi            Stress Protocol Rest HR: 62 Stress HR: 92  Rest BP: 169/94 Stress BP: 169/88  Exercise Time (min): n/a METS: n/a    Predicted Max HR: 156 bpm % Max HR: 66.67 bpm Rate Pressure Product: 18824   Dose of Adenosine (mg):  n/a Dose of Lexiscan: 0.4 mg  Dose of Atropine (mg): n/a Dose of Dobutamine: n/a mcg/kg/min (at max HR)  Stress Test Technologist: TeLeane ParaCCT Nuclear Technologist: PaImagene RichesCNMT    Rest Procedure:  Myocardial perfusion imaging was performed at rest 45 minutes following the intravenous administration of Technetium 9934mstamibi.  Stress Procedure:  The patient received IV Lexiscan 0.4 mg over 15-seconds.  Technetium 28mSestamibi injected IV at 30-seconds.  Patient experienced SOB and 75 mg of Aminophylline was administered.  There were no  significant changes with Lexiscan.  Quantitative spect images were obtained after a 45 minute delay.   Transient Ischemic Dilatation (Normal <1.22):  1.04   QGS EDV:  85 ml QGS ESV:  32 ml LV Ejection Fraction: 63%   Rest ECG: NSR - Normal EKG   Stress ECG: No significant change from baseline ECG   QPS Raw Data Images:  Normal; no motion artifact; normal heart/lung ratio. Stress Images:  Inferoseptal perfusion defect Rest Images:  Inferoseptal perfusion defect Subtraction (SDS):  No evidence of ischemia.   Impression Exercise Capacity:  Lexiscan with no exercise. BP Response:  Normal blood pressure response. Clinical Symptoms:  No significant symptoms noted. ECG Impression:  No significant ECG changes with Lexiscan. Comparison with Prior Nuclear Study: No previous nuclear study performed   Overall Impression:  Low risk stress nuclear study with mostly fixed basal inferoseptal attenuation artifact. No reversible ischemia.   LV Wall Motion:  NL LV Function; NL Wall Motion; EF 63%   ASSESSMENT AND PLAN:  1.  CAD with remote PCI of the first diagonal with DES. Now with at least class 3 angina despite optimal antianginal therapy. Poor quality of life due to symptoms. Multiple cardiac risk factors. Recommend proceeding with cardiac cath with possible PCI. Will arrange for Dr HEllyn Hackto do on Sept 13. The procedure and risks were reviewed including but not limited to death, myocardial infarction, stroke, arrythmias, bleeding, transfusion, emergency surgery, dye allergy, or renal dysfunction. The patient voices understanding and is agreeable to proceed. 2. Hyperlipidemia. No recent lipid panel. Unclear why he is not on a statin. Will update fasting labs and  start Crestor 20 mg daily 3. HTN controlled 4. DM poorly controlled on insulin 5. OSA 6. COPD.    Current medicines are reviewed at length with the patient today.  The patient does not have concerns regarding medicines.  The  following changes have been made:  add Crestor 20 mg daily. If stent placed will need to go on ASA in addition to Plavix.   Labs/ tests ordered today include:   Orders Placed This Encounter  Procedures   Comprehensive Metabolic Panel (CMET)   Lipid panel   CBC w/Diff/Platelet   PT and PTT   HgB A1c   EKG 12-Lead      Disposition:   FU TBD  Signed, Rashida Ladouceur JMartinique MD  12/13/2020 10:45 AM    [id number] [place] [id number] [phone] Fax [phone]

## 2020-12-10 NOTE — H&P (View-Only) (Signed)
Cardiology Office Note   Date:  12/13/2020   ID:  Paul [name], DOB 09-25-1950, MRN [id number]  PCP:  Paul Battles, MD  Cardiologist:   Paul Troy Martinique, MD   Chief Complaint  Patient presents with   Chest Pain   Shortness of Breath      History of Present Illness: Paul [name] is a 70 y.o. male who is seen at the request of Paul [name] for evaluation of dyspnea and chest pain. He has a known history of CAD. Last seen by Paul [name] in October 2015. He has a remote history of CAD with PCI of the first diagonal with DES in August 2013. He has a history of COPD, obesity with OSA, HTN, HLD, and tobacco abuse.  He is also diabetic on insulin. When last evaluated in 2015 he complained of dyspnea. Myoview study showed no ischemia and Echo showed normal LV function. He was weaned off beta blocker due to COPD.   He was admitted in Jan 2020 with acute respiratory failure requiring intubation and RLL PNA. Hospital course complicated by bacteremia, encephalopathy, and prolonged debility.   Patient states that over the past 6-8 months he develops chest pain precordial with any activity. Takes 30 minutes for symptoms to resolve. Some improvement with sl Ntg. Complains of SOB. States because of symptoms he is not able to do anything and is very sedentary. Quality of life is poor. He is now on metoprolol and amlodipine. Diabetic control has been poor and he is on insulin. Reports he quit smoking 6-7 yrs ago. He is not on statin therapy. Is on Plavix. States he was told not to take ASA but no bleeding history.     Past Medical History:  Diagnosis Date   CAD S/P percutaneous coronary angioplasty -- D1, Xience Xpedition DES 2.5 mm x 33 mm 11/24/2011   a) Mild-to-moderate 30-40% lesions in the RCA, LAD and Circumflex. b) CULPRIT LESION: long tubular 70-80% lesion in D1 with FFR of 0.7 --> PCI w/ Xience Xpedition DES 2.5 mm x 30 mm (2.65 MM); c) Lexiscan Myoview 11/2013: No Ischemia or Infarct (Inferior Gut  Attenuation) EF 63%.   COPD (chronic obstructive pulmonary disease) ([place])    "don't have full case of it; I'm right there at it"   Diabetes mellitus without complication ([place])    Essential hypertension 11/24/2011   Exertional dyspnea, chronic    Gout    History of Unstable angina 11/24/2011   Referred for cardiac catheterization   Hyperlipidemia with target LDL less than 70 11/24/2011   Obesity (BMI 30.0-34.9)    OSA (obstructive sleep apnea), uses oxygen at home did not tolerate cpap 11/24/2011    Past Surgical History:  Procedure Laterality Date   CERVICAL SPINE SURGERY  2012   CORONARY ANGIOPLASTY WITH STENT PLACEMENT  11/23/2011   "1; first one"   LEFT HEART CATHETERIZATION WITH CORONARY ANGIOGRAM N/A 11/23/2011   Procedure: LEFT HEART CATHETERIZATION WITH CORONARY ANGIOGRAM;  Surgeon: Paul Man, MD;  Location: [facility] CATH LAB;  Service: Cardiovascular;  Laterality: N/A;     Current Outpatient Medications  Medication Sig Dispense Refill   acetaminophen (TYLENOL) 650 MG CR tablet Take 1,950 mg by mouth daily as needed for pain.      albuterol (PROVENTIL) (2.5 MG/3ML) 0.083% nebulizer solution take 3 mls (2.'5mg'$ ) total by neb q4h PRN for wheezing or SOB.     allopurinol (ZYLOPRIM) 100 MG tablet Take 100 mg by mouth daily.     amLODipine (  NORVASC) 5 MG tablet Take 5 mg by mouth daily.     benzonatate (TESSALON) 100 MG capsule Take by mouth 3 (three) times daily as needed for cough.     clopidogrel (PLAVIX) 75 MG tablet Take 1 tablet (75 mg total) by mouth daily. 30 tablet 1   diclofenac Sodium (VOLTAREN) 1 % GEL Apply 2 g topically 2 (two) times daily as needed (shoulder pain).     Ergocalciferol (VITAMIN D2 PO) Take 5,000 Units by mouth daily.     furosemide (LASIX) 20 MG tablet Take 20 mg by mouth daily.     furosemide (LASIX) 20 MG tablet Take 1 tablet by mouth daily.     gabapentin (NEURONTIN) 300 MG capsule Take 600 mg by mouth 2 (two) times daily.     Insulin Glargine (BASAGLAR  KWIKPEN) 100 UNIT/ML Inject 40 Units into the skin daily.     insulin lispro (HUMALOG) 100 UNIT/ML KwikPen Inject 6-10 Units into the skin See admin instructions. 10 units in the morning, 6 units with lunch, and 6 units with supper     loratadine (CLARITIN) 10 MG tablet Take 1 tablet (10 mg total) by mouth daily. 30 tablet 0   metFORMIN (GLUCOPHAGE-XR) 500 MG 24 hr tablet Take 1,000 mg by mouth in the morning and at bedtime.     metoprolol tartrate (LOPRESSOR) 25 MG tablet Take 1 tablet (25 mg total) by mouth 2 (two) times daily. 60 tablet 0   omeprazole (PRILOSEC) 40 MG capsule Take 40 mg by mouth daily.     Tetrahydrozoline HCl (VISINE EXTRA OP) Place 1 drop into both eyes as needed (itching).     traMADol (ULTRAM) 50 MG tablet Take 50 mg by mouth every 6 (six) hours as needed.     traMADol (ULTRAM) 50 MG tablet 1 tablet as needed - per orthopedics     umeclidinium-vilanterol (ANORO ELLIPTA) 62.5-25 MCG/INH AEPB Inhale 1 puff into the lungs daily. 1 each 12   Vitamin D, Ergocalciferol, (DRISDOL) 1.25 MG (50000 UNIT) CAPS capsule Take 50,000 Units by mouth once a week.     nitroGLYCERIN (NITROSTAT) 0.4 MG SL tablet Place 1 tablet (0.4 mg total) under the tongue every 5 (five) minutes as needed for chest pain. 25 tablet 4   No current facility-administered medications for this visit.    Allergies:   Codeine and Duloxetine hcl    Social History:  The patient  reports that he quit smoking about 4 years ago. His smoking use included cigarettes. He has a 40.00 pack-year smoking history. He has never used smokeless tobacco. He reports current alcohol use. He reports that he does not use drugs.   Family History:  The patient's family history includes Aneurysm in his father; Emphysema in his father; Heart attack in his brother; Heart disease in his sister.    ROS:  Please see the history of present illness.   Otherwise, review of systems are positive for none.   All other systems are reviewed and  negative.    PHYSICAL EXAM: VS:  BP 126/77   Pulse 71   Ht '5\' 5"'$  (1.651 m)   Wt 187 lb 9.6 oz (85.1 kg)   SpO2 93%   BMI 31.22 kg/m  , BMI Body mass index is 31.22 kg/m. GEN: Well nourished, short, stocky, in no acute distress HEENT: normal Neck: no JVD, carotid bruits, or masses Cardiac: RRR; no murmurs, rubs, or gallops,no edema  Respiratory:  clear to auscultation bilaterally, normal work of breathing  GI: soft, nontender, nondistended, + BS MS: no deformity or atrophy Skin: warm and dry, no rash Neuro:  Strength and sensation are intact Psych: euthymic mood, full affect   EKG:  EKG is ordered today. The ekg ordered today demonstrates NSR with rate 71. Nonspecific ST T abnormality. I have personally reviewed and interpreted this study.    Recent Labs: 07/14/2020: BUN 11; Creatinine, Ser 0.88; Hemoglobin 13.8; Platelets 181; Potassium 4.2; Sodium 132; TSH 2.852   Dated 6/7//22: A1c 11.2%. glucose 298. CMET normal.   Lipid Panel    Component Value Date/Time   CHOL 165 11/23/2011 0846   TRIG 216 (H) 04/24/2018 0634   HDL 25 (L) 11/23/2011 0846   CHOLHDL 6.6 11/23/2011 0846   VLDL 29 11/23/2011 0846   LDLCALC 111 (H) 11/23/2011 0846      Wt Readings from Last 3 Encounters:  12/13/20 187 lb 9.6 oz (85.1 kg)  12/06/20 187 lb (84.8 kg)  02/23/20 186 lb (84.4 kg)      Other studies Reviewed: Additional studies/ records that were reviewed today include:   Echo 01/22/14: Study Conclusions   - Left ventricle: The cavity size was normal. Systolic function was    normal. The estimated ejection fraction was in the range of 55%    to 60%. Wall motion was normal; there were no regional wall    motion abnormalities. There was an increased relative    contribution of atrial contraction to ventricular filling.    Doppler parameters are consistent with abnormal left ventricular    relaxation (grade 1 diastolic dysfunction).  - Aortic valve: Trileaflet; normal thickness,  mildly calcified    leaflets.  - Right ventricle: The cavity size was mildly dilated. Wall    thickness was normal.    Myoview 12/04/13: Nuclear Med Background Indication for Stress Test:  Evaluation for Ischemia and Stent Patency History:  COPD and CAD;STENT/PTCA-11/2011;Unstable angina;No prior NUC MPI for comparison;ECHO on 05/15/2012 Cardiac Risk Factors: Family History - CAD, History of Smoking, Hypertension, Lipids and Obesity  Symptoms:  Chest Pain, Fatigue, Light-Headedness and SOB     Nuclear Pre-Procedure Caffeine/Decaff Intake:  7:00pm NPO After: 5:00am   IV Site: R Forearm  IV 0.9% NS with Angio Cath:  22g  Chest Size (in):  46"   IV Started by: Rolene Course, RN  Height: '5\' 5"'$  (1.651 m)  Cup Size: n/a  BMI:  Body mass index is 33.12 kg/(m^2). Weight:  199 lb (90.266 kg)    Tech Comments:  n/a      Nuclear Med Study 1 or 2 day study: 1 day  Stress Test Type:  [place] Provider:  Minus Breeding, MD    Resting Radionuclide: Technetium 44mSestamibi  Resting Radionuclide Dose: 10.5 mCi   Stress Radionuclide:  Technetium 960mestamibi  Stress Radionuclide Dose: 30.7 mCi            Stress Protocol Rest HR: 62 Stress HR: 92  Rest BP: 169/94 Stress BP: 169/88  Exercise Time (min): n/a METS: n/a    Predicted Max HR: 156 bpm % Max HR: 66.67 bpm Rate Pressure Product: 18824   Dose of Adenosine (mg):  n/a Dose of Lexiscan: 0.4 mg  Dose of Atropine (mg): n/a Dose of Dobutamine: n/a mcg/kg/min (at max HR)  Stress Test Technologist: TeLeane ParaCCT Nuclear Technologist: PaImagene RichesCNMT    Rest Procedure:  Myocardial perfusion imaging was performed at rest 45 minutes following the intravenous administration of Technetium 9959mstamibi.  Stress Procedure:  The patient received IV Lexiscan 0.4 mg over 15-seconds.  Technetium 66mSestamibi injected IV at 30-seconds.  Patient experienced SOB and 75 mg of Aminophylline was administered.  There were no  significant changes with Lexiscan.  Quantitative spect images were obtained after a 45 minute delay.   Transient Ischemic Dilatation (Normal <1.22):  1.04   QGS EDV:  85 ml QGS ESV:  32 ml LV Ejection Fraction: 63%   Rest ECG: NSR - Normal EKG   Stress ECG: No significant change from baseline ECG   QPS Raw Data Images:  Normal; no motion artifact; normal heart/lung ratio. Stress Images:  Inferoseptal perfusion defect Rest Images:  Inferoseptal perfusion defect Subtraction (SDS):  No evidence of ischemia.   Impression Exercise Capacity:  Lexiscan with no exercise. BP Response:  Normal blood pressure response. Clinical Symptoms:  No significant symptoms noted. ECG Impression:  No significant ECG changes with Lexiscan. Comparison with Prior Nuclear Study: No previous nuclear study performed   Overall Impression:  Low risk stress nuclear study with mostly fixed basal inferoseptal attenuation artifact. No reversible ischemia.   LV Wall Motion:  NL LV Function; NL Wall Motion; EF 63%   ASSESSMENT AND PLAN:  1.  CAD with remote PCI of the first diagonal with DES. Now with at least class 3 angina despite optimal antianginal therapy. Poor quality of life due to symptoms. Multiple cardiac risk factors. Recommend proceeding with cardiac cath with possible PCI. Will arrange for Paul HEllyn Hackto do on Sept 13. The procedure and risks were reviewed including but not limited to death, myocardial infarction, stroke, arrythmias, bleeding, transfusion, emergency surgery, dye allergy, or renal dysfunction. The patient voices understanding and is agreeable to proceed. 2. Hyperlipidemia. No recent lipid panel. Unclear why he is not on a statin. Will update fasting labs and  start Crestor 20 mg daily 3. HTN controlled 4. DM poorly controlled on insulin 5. OSA 6. COPD.    Current medicines are reviewed at length with the patient today.  The patient does not have concerns regarding medicines.  The  following changes have been made:  add Crestor 20 mg daily. If stent placed will need to go on ASA in addition to Plavix.   Labs/ tests ordered today include:   Orders Placed This Encounter  Procedures   Comprehensive Metabolic Panel (CMET)   Lipid panel   CBC w/Diff/Platelet   PT and PTT   HgB A1c   EKG 12-Lead      Disposition:   FU TBD  Signed, Emmalena Canny JMartinique MD  12/13/2020 10:45 AM    [place] [place] [address]Phone [phone] Fax [phone]

## 2020-12-13 ENCOUNTER — Other Ambulatory Visit: Payer: Self-pay

## 2020-12-13 ENCOUNTER — Other Ambulatory Visit: Payer: Self-pay | Admitting: Cardiology

## 2020-12-13 ENCOUNTER — Ambulatory Visit (INDEPENDENT_AMBULATORY_CARE_PROVIDER_SITE_OTHER): Payer: Medicare Other | Admitting: Cardiology

## 2020-12-13 ENCOUNTER — Encounter: Payer: Self-pay | Admitting: Cardiology

## 2020-12-13 VITALS — BP 126/77 | HR 71 | Ht 65.0 in | Wt 187.6 lb

## 2020-12-13 DIAGNOSIS — I25118 Atherosclerotic heart disease of native coronary artery with other forms of angina pectoris: Secondary | ICD-10-CM | POA: Diagnosis not present

## 2020-12-13 DIAGNOSIS — E1142 Type 2 diabetes mellitus with diabetic polyneuropathy: Secondary | ICD-10-CM | POA: Diagnosis not present

## 2020-12-13 DIAGNOSIS — E785 Hyperlipidemia, unspecified: Secondary | ICD-10-CM

## 2020-12-13 DIAGNOSIS — Z794 Long term (current) use of insulin: Secondary | ICD-10-CM

## 2020-12-13 DIAGNOSIS — I10 Essential (primary) hypertension: Secondary | ICD-10-CM | POA: Diagnosis not present

## 2020-12-13 DIAGNOSIS — J449 Chronic obstructive pulmonary disease, unspecified: Secondary | ICD-10-CM | POA: Diagnosis not present

## 2020-12-13 DIAGNOSIS — I209 Angina pectoris, unspecified: Secondary | ICD-10-CM

## 2020-12-13 MED ORDER — SODIUM CHLORIDE 0.9% FLUSH: 3.0000 mL | Freq: Two times a day (BID) | INTRAVENOUS | Status: DC

## 2020-12-13 MED ORDER — ROSUVASTATIN CALCIUM 20 MG PO TABS: 20.0000 mg | ORAL_TABLET | Freq: Every day | ORAL | 3 refills | Status: DC

## 2020-12-13 NOTE — Patient Instructions (Addendum)
Medication Instructions:   Start Crestor 20 mg daily Continue all other medications  *If you need a refill on your cardiac medications before your next appointment, please call your pharmacy*   Lab Work:  Have labs ( cmet,lipid panel,cbc,pt,a1c ) on Tuesday 9/6  8:00 am to 12:00 noon.No food Water only   Lab order enclosed   Testing/Procedures:  Cardiac Cath     Follow instructions below   Follow-Up: At [facility], you and your health needs are our priority.  As part of our continuing mission to provide you with exceptional heart care, we have created designated Provider Care Teams.  These Care Teams include your primary Cardiologist (physician) and Advanced Practice Providers (APPs -  Physician Assistants and Nurse Practitioners) who all work together to provide you with the care you need, when you need it.  We recommend signing up for the patient portal called "MyChart".  Sign up information is provided on this After Visit Summary.  MyChart is used to connect with patients for Virtual Visits (Telemedicine).  Patients are able to view lab/test results, encounter notes, upcoming appointments, etc.  Non-urgent messages can be sent to your provider as well.   To learn more about what you can do with MyChart, go to [email or web address].      Your next appointment:     The format for your next appointment: Office   Provider:  [place] [address] Dept: [phone] Loc: [phone]  Gilberto Barski  12/13/2020  You are scheduled for a Cardiac Cath on Tuesday  12/28/20 on  with Dr. Ellyn Hack.  1. Please arrive at the [facility] (Main Entrance A) at [facility]: [address] [place], [place] [address] at 5:30 am (This time is two hours before your procedure to ensure your preparation). Free valet parking service is available.    Special note: Every effort is made to have your procedure done on time. Please understand that emergencies sometimes delay scheduled procedures.  2. Diet: Do not eat solid foods after midnight.  The patient may have clear liquids until 5am upon the day of the procedure.  3. Labs: You will need to have blood drawn on Tuesday 9/6 between 8:00 am to 12 :00 noon.No appointment needed.Lab order enclosed.  You need to be fasting.  4. Medication instructions in preparation for your procedure:     Hold Metformin day of cath and hold 2 days after cath  Hold morning dose of Insulin and take 1/2 dose the night before cath  Hold Furosemide morning of cath     On the morning of your procedure, take your Plavix and Aspirin and any morning medicines NOT listed above.  You may use sips of water.  5. Plan for one night stay--bring personal belongings. 6. Bring a current list of your medications and current insurance cards. 7. You MUST have a responsible person to drive you home. 8. Someone MUST be with you the first 24 hours after you arrive home or your discharge will be delayed. 9. Please wear clothes that are easy to get on and off and wear slip-on shoes.  Thank you for allowing [place] to care for you!   -- [facility]

## 2020-12-14 ENCOUNTER — Telehealth: Payer: Self-pay

## 2020-12-14 NOTE — Telephone Encounter (Signed)
LVM for pt to call me back to schedule sleep study  

## 2020-12-15 DIAGNOSIS — N1831 Chronic kidney disease, stage 3a: Secondary | ICD-10-CM | POA: Diagnosis not present

## 2020-12-15 DIAGNOSIS — E1151 Type 2 diabetes mellitus with diabetic peripheral angiopathy without gangrene: Secondary | ICD-10-CM | POA: Diagnosis not present

## 2020-12-15 DIAGNOSIS — I129 Hypertensive chronic kidney disease with stage 1 through stage 4 chronic kidney disease, or unspecified chronic kidney disease: Secondary | ICD-10-CM | POA: Diagnosis not present

## 2020-12-15 DIAGNOSIS — I10 Essential (primary) hypertension: Secondary | ICD-10-CM | POA: Diagnosis not present

## 2020-12-21 DIAGNOSIS — I10 Essential (primary) hypertension: Secondary | ICD-10-CM | POA: Diagnosis not present

## 2020-12-21 DIAGNOSIS — E785 Hyperlipidemia, unspecified: Secondary | ICD-10-CM | POA: Diagnosis not present

## 2020-12-21 DIAGNOSIS — Z794 Long term (current) use of insulin: Secondary | ICD-10-CM | POA: Diagnosis not present

## 2020-12-21 DIAGNOSIS — I25118 Atherosclerotic heart disease of native coronary artery with other forms of angina pectoris: Secondary | ICD-10-CM | POA: Diagnosis not present

## 2020-12-21 DIAGNOSIS — J449 Chronic obstructive pulmonary disease, unspecified: Secondary | ICD-10-CM | POA: Diagnosis not present

## 2020-12-21 DIAGNOSIS — E1142 Type 2 diabetes mellitus with diabetic polyneuropathy: Secondary | ICD-10-CM | POA: Diagnosis not present

## 2020-12-21 LAB — COMPREHENSIVE METABOLIC PANEL
ALT: 31 IU/L (ref 0–44)
AST: 42 IU/L — ABNORMAL HIGH (ref 0–40)
Albumin/Globulin Ratio: 1.4 (ref 1.2–2.2)
Albumin: 4.6 g/dL (ref 3.7–4.7)
Alkaline Phosphatase: 141 IU/L — ABNORMAL HIGH (ref 44–121)
BUN/Creatinine Ratio: 18 (ref 10–24)
BUN: 15 mg/dL (ref 8–27)
Bilirubin Total: 0.8 mg/dL (ref 0.0–1.2)
CO2: 19 mmol/L — ABNORMAL LOW (ref 20–29)
Calcium: 9.7 mg/dL (ref 8.6–10.2)
Chloride: 101 mmol/L (ref 96–106)
Creatinine, Ser: 0.83 mg/dL (ref 0.76–1.27)
Globulin, Total: 3.2 g/dL (ref 1.5–4.5)
Glucose: 195 mg/dL — ABNORMAL HIGH (ref 65–99)
Potassium: 4 mmol/L (ref 3.5–5.2)
Sodium: 139 mmol/L (ref 134–144)
Total Protein: 7.8 g/dL (ref 6.0–8.5)
eGFR: 93 mL/min/{1.73_m2} (ref >59)

## 2020-12-21 LAB — CBC WITH DIFFERENTIAL/PLATELET
Basophils Absolute: 0.1 10*3/uL (ref 0.0–0.2)
Basos: 1 %
EOS (ABSOLUTE): 0.2 10*3/uL (ref 0.0–0.4)
Eos: 4 %
Hematocrit: 38.6 % (ref 37.5–51.0)
Hemoglobin: 12.9 g/dL — ABNORMAL LOW (ref 13.0–17.7)
Immature Grans (Abs): 0 10*3/uL (ref 0.0–0.1)
Immature Granulocytes: 0 %
Lymphocytes Absolute: 2 10*3/uL (ref 0.7–3.1)
Lymphs: 29 %
MCH: 28.8 pg (ref 26.6–33.0)
MCHC: 33.4 g/dL (ref 31.5–35.7)
MCV: 86 fL (ref 79–97)
Monocytes Absolute: 0.4 10*3/uL (ref 0.1–0.9)
Monocytes: 6 %
Neutrophils Absolute: 4.1 10*3/uL (ref 1.4–7.0)
Neutrophils: 60 %
Platelets: 148 10*3/uL — ABNORMAL LOW (ref 150–450)
RBC: 4.48 x10E6/uL (ref 4.14–5.80)
RDW: 13.5 % (ref 11.6–15.4)
WBC: 6.7 10*3/uL (ref 3.4–10.8)

## 2020-12-21 LAB — PT AND PTT
INR: 1.1 (ref 0.9–1.2)
Prothrombin Time: 11.4 s (ref 9.1–12.0)
aPTT: 28 s (ref 24–33)

## 2020-12-21 LAB — LIPID PANEL
Chol/HDL Ratio: 5 ratio (ref 0.0–5.0)
Cholesterol, Total: 119 mg/dL (ref 100–199)
HDL: 24 mg/dL — ABNORMAL LOW (ref >39)
LDL Chol Calc (NIH): 61 mg/dL (ref 0–99)
Triglycerides: 201 mg/dL — ABNORMAL HIGH (ref 0–149)
VLDL Cholesterol Cal: 34 mg/dL (ref 5–40)

## 2020-12-21 LAB — HEMOGLOBIN A1C
Est. average glucose Bld gHb Est-mCnc: 214 mg/dL
Hgb A1c MFr Bld: 9.1 % — ABNORMAL HIGH (ref 4.8–5.6)

## 2020-12-22 ENCOUNTER — Telehealth: Payer: Self-pay | Admitting: *Deleted

## 2020-12-22 ENCOUNTER — Telehealth: Payer: Self-pay | Admitting: Neurology

## 2020-12-22 NOTE — Telephone Encounter (Signed)
Tried leaving patient VM but phone line went busy and would not allow for a VM to be left

## 2020-12-22 NOTE — Telephone Encounter (Signed)
Called left  detail message of lab results ,per dpr . Ok to proceed with cath. Any question may call back.

## 2020-12-22 NOTE — Telephone Encounter (Signed)
-----   Message from [name] Martinique, MD sent at 12/22/2020  3:59 PM EDT ----- The following abnormalities are noted:  labs OK for cath. Sugar is high A1c 9.1. triglycerides mildly elevated. Cholesterol is at goal. All other values are normal, stable or within acceptable limits. Medication changes / Follow up labs / Other changes or recommendations:   None  Peter Martinique, MD 12/22/2020 3:59 PM

## 2020-12-27 ENCOUNTER — Telehealth: Payer: Self-pay | Admitting: *Deleted

## 2020-12-27 NOTE — Telephone Encounter (Signed)
Cardiac catheterization scheduled at [facility] for: Tuesday December 28, 2020 7:30 [facility] Main Entrance A [name]) at: 5:30 AM   No solid food after midnight prior to cath, clear liquids until 5 AM day of procedure.  Medication instructions: Hold: Insulin-AM of procedure/1/2 usual Insulin dose HS prior to procedure Metformin-day of procedure and 48 hours post procedure Lasix-AM of procedure   Except hold medications morning medications can be taken pre-cath with sips of water including: - aspirin 81 mg -Plavix 75 mg    Confirmed patient has responsible adult to drive home post procedure and be with patient first 24 hours after arriving home. *  Patients are allowed one visitor in the waiting room during the time they are at the hospital for their procedure. Both patient and visitor must wear a mask once they enter the hospital.   Patient reports does not currently have any symptoms concerning for COVID-19 and no household members with COVID-19 like illness.   Reviewed procedure/mask/visitor instructions with patient.                 *Patient reports he plans to drive himself to hospital, leave his vehicle overnight at hospital, will make arrangements with his brother to drive him home at discharge tomorrow and stay with him.

## 2020-12-28 ENCOUNTER — Ambulatory Visit (HOSPITAL_COMMUNITY)
Admission: RE | Admit: 2020-12-28 | Discharge: 2020-12-28 | Disposition: A | Discharge status: Home / Self Care | Payer: Medicare Other | Attending: Cardiology | Admitting: Cardiology

## 2020-12-28 ENCOUNTER — Encounter (HOSPITAL_COMMUNITY): Payer: Self-pay | Admitting: Cardiology

## 2020-12-28 ENCOUNTER — Ambulatory Visit (HOSPITAL_BASED_OUTPATIENT_CLINIC_OR_DEPARTMENT_OTHER): Payer: Medicare Other

## 2020-12-28 ENCOUNTER — Encounter (HOSPITAL_COMMUNITY)
Admission: RE | Disposition: A | Discharge status: Home / Self Care | Payer: Self-pay | Source: Home / Self Care | Attending: Cardiology

## 2020-12-28 ENCOUNTER — Other Ambulatory Visit: Payer: Self-pay

## 2020-12-28 DIAGNOSIS — Z885 Allergy status to narcotic agent status: Secondary | ICD-10-CM | POA: Diagnosis not present

## 2020-12-28 DIAGNOSIS — E1165 Type 2 diabetes mellitus with hyperglycemia: Secondary | ICD-10-CM | POA: Diagnosis not present

## 2020-12-28 DIAGNOSIS — Z7984 Long term (current) use of oral hypoglycemic drugs: Secondary | ICD-10-CM | POA: Diagnosis not present

## 2020-12-28 DIAGNOSIS — Z8249 Family history of ischemic heart disease and other diseases of the circulatory system: Secondary | ICD-10-CM | POA: Diagnosis not present

## 2020-12-28 DIAGNOSIS — Z6833 Body mass index (BMI) 33.0-33.9, adult: Secondary | ICD-10-CM | POA: Diagnosis not present

## 2020-12-28 DIAGNOSIS — I25118 Atherosclerotic heart disease of native coronary artery with other forms of angina pectoris: Secondary | ICD-10-CM | POA: Insufficient documentation

## 2020-12-28 DIAGNOSIS — I25119 Atherosclerotic heart disease of native coronary artery with unspecified angina pectoris: Secondary | ICD-10-CM | POA: Diagnosis not present

## 2020-12-28 DIAGNOSIS — I10 Essential (primary) hypertension: Secondary | ICD-10-CM | POA: Insufficient documentation

## 2020-12-28 DIAGNOSIS — E1169 Type 2 diabetes mellitus with other specified complication: Secondary | ICD-10-CM | POA: Diagnosis present

## 2020-12-28 DIAGNOSIS — J449 Chronic obstructive pulmonary disease, unspecified: Secondary | ICD-10-CM | POA: Diagnosis not present

## 2020-12-28 DIAGNOSIS — Z888 Allergy status to other drugs, medicaments and biological substances status: Secondary | ICD-10-CM | POA: Diagnosis not present

## 2020-12-28 DIAGNOSIS — I209 Angina pectoris, unspecified: Secondary | ICD-10-CM | POA: Clinically undetermined

## 2020-12-28 DIAGNOSIS — Z79899 Other long term (current) drug therapy: Secondary | ICD-10-CM | POA: Diagnosis not present

## 2020-12-28 DIAGNOSIS — E785 Hyperlipidemia, unspecified: Secondary | ICD-10-CM | POA: Diagnosis not present

## 2020-12-28 DIAGNOSIS — I2510 Atherosclerotic heart disease of native coronary artery without angina pectoris: Secondary | ICD-10-CM | POA: Diagnosis not present

## 2020-12-28 DIAGNOSIS — E669 Obesity, unspecified: Secondary | ICD-10-CM | POA: Diagnosis not present

## 2020-12-28 DIAGNOSIS — Z794 Long term (current) use of insulin: Secondary | ICD-10-CM | POA: Diagnosis not present

## 2020-12-28 DIAGNOSIS — Z7902 Long term (current) use of antithrombotics/antiplatelets: Secondary | ICD-10-CM | POA: Diagnosis not present

## 2020-12-28 DIAGNOSIS — Z87891 Personal history of nicotine dependence: Secondary | ICD-10-CM | POA: Insufficient documentation

## 2020-12-28 DIAGNOSIS — G4733 Obstructive sleep apnea (adult) (pediatric): Secondary | ICD-10-CM | POA: Insufficient documentation

## 2020-12-28 DIAGNOSIS — R0600 Dyspnea, unspecified: Secondary | ICD-10-CM | POA: Diagnosis not present

## 2020-12-28 HISTORY — PX: LEFT HEART CATH AND CORONARY ANGIOGRAPHY: CATH118249

## 2020-12-28 LAB — ECHOCARDIOGRAM COMPLETE
Area-P 1/2: 2.4 cm2
Height: 65 in
S' Lateral: 2.6 cm
Weight: 2912 oz

## 2020-12-28 LAB — GLUCOSE, CAPILLARY: Glucose-Capillary: 209 mg/dL — ABNORMAL HIGH (ref 70–99)

## 2020-12-28 LAB — POCT ACTIVATED CLOTTING TIME: Activated Clotting Time: 306 seconds

## 2020-12-28 SURGERY — LEFT HEART CATH AND CORONARY ANGIOGRAPHY
Anesthesia: LOCAL

## 2020-12-28 MED ORDER — ONDANSETRON HCL 4 MG/2ML IJ SOLN: 4.0000 mg | Freq: Four times a day (QID) | INTRAMUSCULAR | Status: DC | PRN

## 2020-12-28 MED ORDER — LIDOCAINE HCL (PF) 1 % IJ SOLN
INTRAMUSCULAR | Status: DC | PRN
  Administered 2020-12-28: 2 mL

## 2020-12-28 MED ORDER — LABETALOL HCL 5 MG/ML IV SOLN: 10.0000 mg | INTRAVENOUS | Status: DC | PRN

## 2020-12-28 MED ORDER — SODIUM CHLORIDE 0.9% FLUSH: 3.0000 mL | INTRAVENOUS | Status: DC | PRN

## 2020-12-28 MED ORDER — SODIUM CHLORIDE 0.9 % IV SOLN: 250.0000 mL | INTRAVENOUS | Status: DC | PRN

## 2020-12-28 MED ORDER — VERAPAMIL HCL 2.5 MG/ML IV SOLN
INTRAVENOUS | Status: DC | PRN
  Administered 2020-12-28: 10 mL via INTRA_ARTERIAL

## 2020-12-28 MED ORDER — HEPARIN (PORCINE) IN NACL 1000-0.9 UT/500ML-% IV SOLN
INTRAVENOUS | Status: DC | PRN
  Administered 2020-12-28 (×2): 500 mL

## 2020-12-28 MED ORDER — MIDAZOLAM HCL 2 MG/2ML IJ SOLN
INTRAMUSCULAR | Status: DC | PRN
  Administered 2020-12-28: 2 mg via INTRAVENOUS

## 2020-12-28 MED ORDER — ASPIRIN 81 MG PO CHEW: 81.0000 mg | CHEWABLE_TABLET | ORAL | Status: DC

## 2020-12-28 MED ORDER — ACETAMINOPHEN 325 MG PO TABS: 650.0000 mg | ORAL_TABLET | ORAL | Status: DC | PRN

## 2020-12-28 MED ORDER — HEPARIN SODIUM (PORCINE) 1000 UNIT/ML IJ SOLN
INTRAMUSCULAR | Status: DC | PRN
  Administered 2020-12-28 (×2): 4500 [IU] via INTRAVENOUS

## 2020-12-28 MED ORDER — HEPARIN (PORCINE) IN NACL 1000-0.9 UT/500ML-% IV SOLN
INTRAVENOUS | Status: AC
  Filled 2020-12-28: qty 500

## 2020-12-28 MED ORDER — LIDOCAINE HCL (PF) 1 % IJ SOLN
INTRAMUSCULAR | Status: AC
  Filled 2020-12-28: qty 30

## 2020-12-28 MED ORDER — HYDRALAZINE HCL 20 MG/ML IJ SOLN: 10.0000 mg | INTRAMUSCULAR | Status: DC | PRN

## 2020-12-28 MED ORDER — IOHEXOL 350 MG/ML SOLN
INTRAVENOUS | Status: DC | PRN
  Administered 2020-12-28: 55 mL

## 2020-12-28 MED ORDER — FENTANYL CITRATE (PF) 100 MCG/2ML IJ SOLN
INTRAMUSCULAR | Status: AC
  Filled 2020-12-28: qty 2

## 2020-12-28 MED ORDER — SODIUM CHLORIDE 0.9 % WEIGHT BASED INFUSION
3.0000 mL/kg/h | INTRAVENOUS | Status: AC
  Administered 2020-12-28: 3 mL/kg/h via INTRAVENOUS

## 2020-12-28 MED ORDER — VERAPAMIL HCL 2.5 MG/ML IV SOLN
INTRAVENOUS | Status: AC
  Filled 2020-12-28: qty 2

## 2020-12-28 MED ORDER — CLOPIDOGREL BISULFATE 75 MG PO TABS
75.0000 mg | ORAL_TABLET | Freq: Once | ORAL | Status: AC
  Administered 2020-12-28: 75 mg via ORAL
  Filled 2020-12-28: qty 1

## 2020-12-28 MED ORDER — SODIUM CHLORIDE 0.9% FLUSH: 3.0000 mL | Freq: Two times a day (BID) | INTRAVENOUS | Status: DC

## 2020-12-28 MED ORDER — FENTANYL CITRATE (PF) 100 MCG/2ML IJ SOLN
INTRAMUSCULAR | Status: DC | PRN
  Administered 2020-12-28: 25 ug via INTRAVENOUS

## 2020-12-28 MED ORDER — SODIUM CHLORIDE 0.9 % WEIGHT BASED INFUSION: 1.0000 mL/kg/h | INTRAVENOUS | Status: DC

## 2020-12-28 MED ORDER — MIDAZOLAM HCL 2 MG/2ML IJ SOLN
INTRAMUSCULAR | Status: AC
  Filled 2020-12-28: qty 2

## 2020-12-28 MED ORDER — HEPARIN SODIUM (PORCINE) 1000 UNIT/ML IJ SOLN
INTRAMUSCULAR | Status: AC
  Filled 2020-12-28: qty 1

## 2020-12-28 MED ORDER — SODIUM CHLORIDE 0.9 % IV SOLN: INTRAVENOUS | Status: DC

## 2020-12-28 SURGICAL SUPPLY — 15 items
CATH INFINITI 5FR ANG PIGTAIL (CATHETERS) ×1 IMPLANT
CATH OPTITORQUE TIG 4.0 5F (CATHETERS) ×1 IMPLANT
CATH VISTA GUIDE 6FR XBLAD3.5 (CATHETERS) ×1 IMPLANT
DEVICE RAD COMP TR BAND LRG (VASCULAR PRODUCTS) ×1 IMPLANT
GLIDESHEATH SLEND SS 6F .021 (SHEATH) ×1 IMPLANT
GUIDEWIRE INQWIRE 1.5J.035X260 (WIRE) IMPLANT
GUIDEWIRE PRESSURE X 175 (WIRE) ×1 IMPLANT
INQWIRE 1.5J .035X260CM (WIRE) ×2
KIT ESSENTIALS PG (KITS) ×1 IMPLANT
KIT HEART LEFT (KITS) ×2 IMPLANT
PACK CARDIAC CATHETERIZATION (CUSTOM PROCEDURE TRAY) ×2 IMPLANT
SHEATH PROBE COVER 6X72 (BAG) ×1 IMPLANT
SYR MEDRAD MARK 7 150ML (SYRINGE) ×2 IMPLANT
TRANSDUCER W/STOPCOCK (MISCELLANEOUS) ×2 IMPLANT
TUBING CIL FLEX 10 FLL-RA (TUBING) ×2 IMPLANT

## 2020-12-28 NOTE — Progress Notes (Signed)
Patient was given discharge instructions. He verbalized understanding. 

## 2020-12-28 NOTE — Progress Notes (Signed)
Echocardiogram 2D Echocardiogram has been performed.  Oneal Deputy Nyah Shepherd RDCS 12/28/2020, 11:27 AM

## 2020-12-28 NOTE — Progress Notes (Signed)
CARDIAC REHAB PHASE I   Went to complete preop education with pt. Pt states he needs time to decide on which plan he wants to take. Left Cardiac Surgery booklet, OHS care guide, and in-the-tube sheet at bedside. Encouraged pt to let RN know if he had any questions or concerns in the mean time.   [id number] Rufina Falco, RN BSN 12/28/2020 11:24 AM

## 2020-12-28 NOTE — Discharge Instructions (Signed)

## 2020-12-28 NOTE — Progress Notes (Signed)
TCTS consulted for outpatient CABG evaluation. TCTS office will call patient with an appointment.

## 2020-12-28 NOTE — Interval H&P Note (Signed)
History and Physical Interval Note:  12/28/2020 7:33 AM  [name]  has presented today for surgery, with the diagnosis of chest pain - shortness of breath -> class III angina.  The various methods of treatment have been discussed with the patient and family. After consideration of risks, benefits and other options for treatment, the patient has consented to  Procedure(s): LEFT HEART CATH AND CORONARY ANGIOGRAPHY (N/A)  RIGHT HEART CATHETERIZATION PERCUTANEOUS CORONARY INTERVENTION   as a surgical intervention.  The patient's history has been reviewed, patient examined, no change in status, stable for surgery.  I have reviewed the patient's chart and labs.  Questions were answered to the patient's satisfaction.     Cath Lab Visit (complete for each Cath Lab visit)  Clinical Evaluation Leading to the Procedure:   ACS: No.  Non-ACS:    Anginal Classification: CCS III  Anti-ischemic medical therapy: Maximal Therapy (2 or more classes of medications)  Non-Invasive Test Results: No non-invasive testing performed  Prior CABG: No previous CABG    Glenetta Hew

## 2020-12-29 ENCOUNTER — Telehealth: Payer: Self-pay

## 2020-12-29 NOTE — Telephone Encounter (Signed)
We have attempted to call the patient 2 times to schedule sleep study. Patient has been unavailable at the phone numbers we have on file and has not returned our calls. If patient calls back we will schedule them for their sleep study. ° °

## 2021-01-06 NOTE — Progress Notes (Signed)
      [address]       [place] [address]             [phone]        Mccartney Chuba [facility] Medical Record #[id number] Date of Birth: [birth year]  Referring: Marykay Lex, MD Primary Care: Jarome Matin, MD Primary Cardiologist:None  Chief Complaint:   No chief complaint on file.   H Past Medical History:  Diagnosis Date   CAD S/P percutaneous coronary angioplasty -- D1, Xience Xpedition DES 2.5 mm x 33 mm 11/24/2011   a) Mild-to-moderate 30-40% lesions in the RCA, LAD and Circumflex. b) CULPRIT LESION: long tubular 70-80% lesion in D1 with FFR of 0.7 --> PCI w/ Xience Xpedition DES 2.5 mm x 30 mm (2.65 MM); c) Lexiscan Myoview 11/2013: No Ischemia or Infarct (Inferior Gut Attenuation) EF 63%.   COPD (chronic obstructive pulmonary disease) ([facility])    "don't have full case of it; I'm right there at it"   Diabetes mellitus without complication ([facility])    Essential hypertension 11/24/2011   Exertional dyspnea, chronic    Gout    History of Unstable angina 11/24/2011   Referred for cardiac catheterization   Hyperlipidemia with target LDL less than 70 11/24/2011   Obesity (BMI 30.0-34.9)    OSA (obstructive sleep apnea), uses oxygen at home did not tolerate cpap 11/24/2011    Past Surgical History:  Procedure Laterality Date   CERVICAL SPINE SURGERY  2012   CORONARY ANGIOPLASTY WITH STENT PLACEMENT  11/23/2011   "1; first one"   LEFT HEART CATH AND CORONARY ANGIOGRAPHY N/A 12/28/2020   Procedure: LEFT HEART CATH AND CORONARY ANGIOGRAPHY;  Surgeon: Marykay Lex, MD;  Location: [facility] INVASIVE CV LAB;  Service: Cardiovascular;  Laterality: N/A;   LEFT HEART CATHETERIZATION WITH CORONARY ANGIOGRAM N/A 11/23/2011   Procedure: LEFT HEART CATHETERIZATION WITH CORONARY ANGIOGRAM;  Surgeon: Marykay Lex, MD;  Location[facility] CATH LAB;  Service: Cardiovascular;  Laterality: N/A;     Social History   Tobacco Use  Smoking Status Former   Packs/day: 1.00   Years: 40.00    Pack years: 40.00   Types: Cigarettes   Quit date: 06/10/2016   Years since quitting: 4.5  Smokeless Tobacco Never  Tobacco Comments   02/18/14- smokes occ cig maybe 3 x per wk    Social History   Substance and Sexual Activity  Alcohol Use Yes   Alcohol/week: 0.0 standard drinks   Comment: social 2-3 drink a week     Allergies  Allergen Reactions   Codeine Itching   Duloxetine Hcl Other (See Comments)    Took at night and still felt very lethargic the following day.  (malaise)  PT DOES NOT REMEMBER TAKING MED

## 2021-01-07 ENCOUNTER — Encounter: Payer: Medicare Other | Admitting: Thoracic Surgery (Cardiothoracic Vascular Surgery)

## 2021-01-09 ENCOUNTER — Emergency Department (HOSPITAL_COMMUNITY): Payer: Medicare Other

## 2021-01-09 ENCOUNTER — Other Ambulatory Visit: Payer: Self-pay

## 2021-01-09 ENCOUNTER — Emergency Department (HOSPITAL_COMMUNITY)
Admission: EM | Admit: 2021-01-09 | Discharge: 2021-01-10 | Disposition: A | Discharge status: Home / Self Care | Payer: Medicare Other | Attending: Emergency Medicine | Admitting: Emergency Medicine

## 2021-01-09 DIAGNOSIS — R109 Unspecified abdominal pain: Secondary | ICD-10-CM | POA: Diagnosis not present

## 2021-01-09 DIAGNOSIS — J449 Chronic obstructive pulmonary disease, unspecified: Secondary | ICD-10-CM | POA: Insufficient documentation

## 2021-01-09 DIAGNOSIS — M25511 Pain in right shoulder: Secondary | ICD-10-CM | POA: Diagnosis not present

## 2021-01-09 DIAGNOSIS — R1011 Right upper quadrant pain: Secondary | ICD-10-CM

## 2021-01-09 DIAGNOSIS — Z7902 Long term (current) use of antithrombotics/antiplatelets: Secondary | ICD-10-CM | POA: Diagnosis not present

## 2021-01-09 DIAGNOSIS — M25519 Pain in unspecified shoulder: Secondary | ICD-10-CM | POA: Diagnosis not present

## 2021-01-09 DIAGNOSIS — K5730 Diverticulosis of large intestine without perforation or abscess without bleeding: Secondary | ICD-10-CM | POA: Diagnosis not present

## 2021-01-09 DIAGNOSIS — Z794 Long term (current) use of insulin: Secondary | ICD-10-CM | POA: Diagnosis not present

## 2021-01-09 DIAGNOSIS — R059 Cough, unspecified: Secondary | ICD-10-CM | POA: Diagnosis not present

## 2021-01-09 DIAGNOSIS — I10 Essential (primary) hypertension: Secondary | ICD-10-CM | POA: Insufficient documentation

## 2021-01-09 DIAGNOSIS — I700 Atherosclerosis of aorta: Secondary | ICD-10-CM | POA: Diagnosis not present

## 2021-01-09 DIAGNOSIS — R079 Chest pain, unspecified: Secondary | ICD-10-CM | POA: Diagnosis not present

## 2021-01-09 DIAGNOSIS — Z79899 Other long term (current) drug therapy: Secondary | ICD-10-CM | POA: Diagnosis not present

## 2021-01-09 DIAGNOSIS — R0602 Shortness of breath: Secondary | ICD-10-CM | POA: Diagnosis not present

## 2021-01-09 DIAGNOSIS — Z87891 Personal history of nicotine dependence: Secondary | ICD-10-CM | POA: Insufficient documentation

## 2021-01-09 DIAGNOSIS — E119 Type 2 diabetes mellitus without complications: Secondary | ICD-10-CM | POA: Insufficient documentation

## 2021-01-09 DIAGNOSIS — R1084 Generalized abdominal pain: Secondary | ICD-10-CM | POA: Insufficient documentation

## 2021-01-09 DIAGNOSIS — Z20822 Contact with and (suspected) exposure to covid-19: Secondary | ICD-10-CM | POA: Diagnosis not present

## 2021-01-09 DIAGNOSIS — R000 Tachycardia, unspecified: Secondary | ICD-10-CM | POA: Diagnosis not present

## 2021-01-09 DIAGNOSIS — R739 Hyperglycemia, unspecified: Secondary | ICD-10-CM | POA: Diagnosis not present

## 2021-01-09 DIAGNOSIS — I2510 Atherosclerotic heart disease of native coronary artery without angina pectoris: Secondary | ICD-10-CM | POA: Diagnosis not present

## 2021-01-09 DIAGNOSIS — I7102 Dissection of abdominal aorta: Secondary | ICD-10-CM | POA: Diagnosis not present

## 2021-01-09 LAB — CBC WITH DIFFERENTIAL/PLATELET
Abs Immature Granulocytes: 0.04 10*3/uL (ref 0.00–0.07)
Basophils Absolute: 0.1 10*3/uL (ref 0.0–0.1)
Basophils Relative: 1 %
Eosinophils Absolute: 0.2 10*3/uL (ref 0.0–0.5)
Eosinophils Relative: 2 %
HCT: 40.3 % (ref 39.0–52.0)
Hemoglobin: 13.2 g/dL (ref 13.0–17.0)
Immature Granulocytes: 0 %
Lymphocytes Relative: 22 %
Lymphs Abs: 2.2 10*3/uL (ref 0.7–4.0)
MCH: 30 pg (ref 26.0–34.0)
MCHC: 32.8 g/dL (ref 30.0–36.0)
MCV: 91.6 fL (ref 80.0–100.0)
Monocytes Absolute: 0.7 10*3/uL (ref 0.1–1.0)
Monocytes Relative: 7 %
Neutro Abs: 6.8 10*3/uL (ref 1.7–7.7)
Neutrophils Relative %: 68 %
Platelets: 158 10*3/uL (ref 150–400)
RBC: 4.4 MIL/uL (ref 4.22–5.81)
RDW: 13.9 % (ref 11.5–15.5)
WBC: 10 10*3/uL (ref 4.0–10.5)
nRBC: 0 % (ref 0.0–0.2)

## 2021-01-09 LAB — COMPREHENSIVE METABOLIC PANEL
ALT: 30 U/L (ref 0–44)
AST: 56 U/L — ABNORMAL HIGH (ref 15–41)
Albumin: 3.8 g/dL (ref 3.5–5.0)
Alkaline Phosphatase: 103 U/L (ref 38–126)
Anion gap: 12 (ref 5–15)
BUN: 15 mg/dL (ref 8–23)
CO2: 19 mmol/L — ABNORMAL LOW (ref 22–32)
Calcium: 9.4 mg/dL (ref 8.9–10.3)
Chloride: 102 mmol/L (ref 98–111)
Creatinine, Ser: 1.1 mg/dL (ref 0.61–1.24)
GFR, Estimated: >60 mL/min (ref >60)
Glucose, Bld: 237 mg/dL — ABNORMAL HIGH (ref 70–99)
Potassium: 4.6 mmol/L (ref 3.5–5.1)
Sodium: 133 mmol/L — ABNORMAL LOW (ref 135–145)
Total Bilirubin: 1.2 mg/dL (ref 0.3–1.2)
Total Protein: 7.5 g/dL (ref 6.5–8.1)

## 2021-01-09 LAB — URINALYSIS, ROUTINE W REFLEX MICROSCOPIC
Bilirubin Urine: NEGATIVE
Glucose, UA: 150 mg/dL — AB
Hgb urine dipstick: NEGATIVE
Ketones, ur: 5 mg/dL — AB
Leukocytes,Ua: NEGATIVE
Nitrite: NEGATIVE
Protein, ur: NEGATIVE mg/dL
Specific Gravity, Urine: 1.018 (ref 1.005–1.030)
pH: 5 (ref 5.0–8.0)

## 2021-01-09 LAB — RESP PANEL BY RT-PCR (FLU A&B, COVID) ARPGX2
Influenza A by PCR: NEGATIVE
Influenza B by PCR: NEGATIVE
SARS Coronavirus 2 by RT PCR: NEGATIVE

## 2021-01-09 LAB — LIPASE, BLOOD: Lipase: 32 U/L (ref 11–51)

## 2021-01-09 LAB — TROPONIN I (HIGH SENSITIVITY): Troponin I (High Sensitivity): 5 ng/L (ref <18)

## 2021-01-09 MED ORDER — IPRATROPIUM-ALBUTEROL 0.5-2.5 (3) MG/3ML IN SOLN
3.0000 mL | Freq: Once | RESPIRATORY_TRACT | Status: AC
  Administered 2021-01-09: 3 mL via RESPIRATORY_TRACT
  Filled 2021-01-09: qty 3

## 2021-01-09 MED ORDER — IOHEXOL 350 MG/ML SOLN
100.0000 mL | Freq: Once | INTRAVENOUS | Status: AC | PRN
  Administered 2021-01-09: 100 mL via INTRAVENOUS

## 2021-01-09 MED ORDER — ALBUTEROL (5 MG/ML) CONTINUOUS INHALATION SOLN: 10.0000 mg/h | INHALATION_SOLUTION | RESPIRATORY_TRACT | Status: DC

## 2021-01-09 MED ORDER — MORPHINE SULFATE (PF) 4 MG/ML IV SOLN
4.0000 mg | Freq: Once | INTRAVENOUS | Status: AC
  Administered 2021-01-09: 4 mg via INTRAVENOUS
  Filled 2021-01-09: qty 1

## 2021-01-09 MED ORDER — ALBUTEROL SULFATE (2.5 MG/3ML) 0.083% IN NEBU
10.0000 mg | INHALATION_SOLUTION | Freq: Once | RESPIRATORY_TRACT | Status: AC
  Administered 2021-01-09: 10 mg via RESPIRATORY_TRACT
  Filled 2021-01-09: qty 12

## 2021-01-09 NOTE — ED Provider Notes (Signed)
[facility] EMERGENCY DEPARTMENT Provider Note   CSN: [id number] Arrival date & time: 01/09/21  1658     History Chief Complaint  Patient presents with   Shoulder Pain   Abdominal Pain    Paul [name] is a 70 y.o. male.   Shoulder Pain Abdominal Pain  This patient is a 70 year old male, he has a known history of coronary angioplasty with stenting in the past, he has COPD diabetes and hypertension.  The patient uses oxygen at night when he sleeps.  The patient had actually been seen and evaluated on 13 September during which time he underwent a left heart catheterization showing multivessel coronary disease.  He was referred to cardiothoracic surgery as an outpatient, he was symptom-free when he went to the office 2 days ago but unfortunately the surgeon got called in to do an emergency surgery and was not able to make the appointment, the patient became very frustrated and states that while he was there he developed this increasing pain in the right lower side and flank which has been persistent for 2 days.  This is distinctly different than his prior history, he states that he has had no prior abdominal surgery including gallbladder or appendix or kidney problems.  The pain has been constant, right-sided, reproducible with movement coughing and deep breathing.  He does notice an increased cough with sputum production as well and has had some pain in his right shoulder going into it towards his right spine.  He does report having chronic arthritis in his spine and his shoulders.  Past Medical History:  Diagnosis Date   CAD S/P percutaneous coronary angioplasty -- D1, Xience Xpedition DES 2.5 mm x 33 mm 11/24/2011   a) Mild-to-moderate 30-40% lesions in the RCA, LAD and Circumflex. b) CULPRIT LESION: long tubular 70-80% lesion in D1 with FFR of 0.7 --> PCI w/ Xience Xpedition DES 2.5 mm x 30 mm (2.65 MM); c) Lexiscan Myoview 11/2013: No Ischemia or Infarct (Inferior Gut  Attenuation) EF 63%.   COPD (chronic obstructive pulmonary disease) ([place])    "don't have full case of it; I'm right there at it"   Diabetes mellitus without complication ([place])    Essential hypertension 11/24/2011   Exertional dyspnea, chronic    Gout    History of Unstable angina 11/24/2011   Referred for cardiac catheterization   Hyperlipidemia with target LDL less than 70 11/24/2011   Obesity (BMI 30.0-34.9)    OSA (obstructive sleep apnea), uses oxygen at home did not tolerate cpap 11/24/2011    Patient Active Problem List   Diagnosis Date Noted   Angina, class III ([place]) 12/28/2020   Healthcare maintenance 02/23/2020   Primary osteoarthritis, left shoulder 07/29/2018   Prolonged QT interval    Labile blood glucose    Type 2 diabetes mellitus with peripheral neuropathy (HCC)    Labile blood pressure    Sleep disturbance    Hypoalbuminemia due to protein-calorie malnutrition ([place])    Poorly controlled type 2 diabetes mellitus with peripheral neuropathy (HCC)    Dysphagia    Candidiasis    Acute encephalopathy 05/03/2018   Pressure injury of skin 04/30/2018   Coronary artery disease involving native coronary artery of native heart with angina pectoris ([place])    Tobacco abuse    Hypertension    Acute blood loss anemia    Bacteremia    Respiratory failure ([place])    CAP (community acquired pneumonia) 04/21/2018   Corneal irritation of both eyes 10/31/2017  Allergic rhinitis due to animal hair and dander 10/31/2017   OSA on CPAP 10/10/2016   OSA and COPD overlap syndrome ([place]) 03/02/2016   Bronchitis, chronic obstructive, with exacerbation ([place]) 03/02/2016   Hypoxemia 03/02/2016   Dependence on supplemental oxygen 03/02/2016   CAD, multiple vessel 03/02/2016   Gastroesophageal reflux disease 03/02/2016   HTN (hypertension), malignant 03/02/2016   Cigarette smoker 02/22/2014   DOE (dyspnea on exertion) 01/20/2014   Obesity (BMI 30.0-34.9)    Essential hypertension 11/24/2011    Hyperlipidemia with target LDL less than 70 11/24/2011   OSA (obstructive sleep apnea), uses oxygen at home did not tolerate cpap 11/24/2011   COPD 11/24/2011   CAD S/P percutaneous coronary angioplasty -- D1, Xience Xpedition DES 2.5 mm x 33 mm 11/24/2011    Past Surgical History:  Procedure Laterality Date   CERVICAL SPINE SURGERY  2012   CORONARY ANGIOPLASTY WITH STENT PLACEMENT  11/23/2011   "1; first one"   LEFT HEART CATH AND CORONARY ANGIOGRAPHY N/A 12/28/2020   Procedure: LEFT HEART CATH AND CORONARY ANGIOGRAPHY;  Surgeon: Leonie Man, MD;  Location: [facility];  Service: Cardiovascular;  Laterality: N/A;   LEFT HEART CATHETERIZATION WITH CORONARY ANGIOGRAM N/A 11/23/2011   Procedure: LEFT HEART CATHETERIZATION WITH CORONARY ANGIOGRAM;  Surgeon: Leonie Man, MD;  Location: [facility] CATH LAB;  Service: Cardiovascular;  Laterality: N/A;       Family History  Problem Relation Age of Onset   Heart disease Sister    Heart attack Brother    Emphysema Father        smoked   Aneurysm Father     Social History   Tobacco Use   Smoking status: Former    Packs/day: 1.00    Years: 40.00    Pack years: 40.00    Types: Cigarettes    Quit date: 06/10/2016    Years since quitting: 4.5   Smokeless tobacco: Never   Tobacco comments:    02/18/14- smokes occ cig maybe 3 x per wk  Substance Use Topics   Alcohol use: Yes    Alcohol/week: 0.0 standard drinks    Comment: social 2-3 drink a week   Drug use: No    Home Medications Prior to Admission medications   Medication Sig Start Date End Date Taking? Authorizing Provider  acetaminophen (TYLENOL) 650 MG CR tablet Take 1,950 mg by mouth daily as needed for pain.     [provider]  albuterol (PROVENTIL) (2.5 MG/3ML) 0.083% nebulizer solution take 3 mls (2.5mg ) total by neb q4h PRN for wheezing or SOB. 05/28/18   [provider]  allopurinol (ZYLOPRIM) 100 MG tablet Take 100 mg by mouth daily.    [provider]  amLODipine (NORVASC) 5 MG tablet Take 5 mg by mouth daily. 07/01/18   [provider]  benzonatate (TESSALON) 100 MG capsule Take by mouth 3 (three) times daily as needed for cough.    [provider]  clopidogrel (PLAVIX) 75 MG tablet Take 1 tablet (75 mg total) by mouth daily. 05/22/18   Angiulli, Lavon Paganini, PA-C  diclofenac Sodium (VOLTAREN) 1 % GEL Apply 2 g topically 2 (two) times daily as needed (shoulder pain).    [provider]  Ergocalciferol (VITAMIN D2 PO) Take 5,000 Units by mouth daily.    [provider]  furosemide (LASIX) 20 MG tablet Take 20 mg by mouth daily.    [provider]  furosemide (LASIX) 20 MG tablet Take 1  tablet by mouth daily.    [provider]  gabapentin (NEURONTIN) 300 MG capsule Take 600 mg by mouth 2 (two) times daily.    [provider]  Insulin Glargine (BASAGLAR KWIKPEN) 100 UNIT/ML Inject 40 Units into the skin daily. 05/18/20   [provider]  insulin lispro (HUMALOG) 100 UNIT/ML KwikPen Inject 6-10 Units into the skin See admin instructions. 10 units in the morning, 6 units with lunch, and 6 units with supper    [provider]  loratadine (CLARITIN) 10 MG tablet Take 1 tablet (10 mg total) by mouth daily. 05/22/18   Angiulli, Lavon Paganini, PA-C  metFORMIN (GLUCOPHAGE-XR) 500 MG 24 hr tablet Take 1,000 mg by mouth in the morning and at bedtime.    [provider]  metoprolol tartrate (LOPRESSOR) 25 MG tablet Take 1 tablet (25 mg total) by mouth 2 (two) times daily. 05/22/18   Angiulli, Lavon Paganini, PA-C  nitroGLYCERIN (NITROSTAT) 0.4 MG SL tablet Place 1 tablet (0.4 mg total) under the tongue every 5 (five) minutes as needed for chest pain. 11/24/11 03/11/19  Isaiah Serge, NP  omeprazole (PRILOSEC) 40 MG capsule Take 40 mg by mouth daily.    [provider]  rosuvastatin (CRESTOR) 20 MG tablet Take 1 tablet (20 mg total) by mouth daily. 12/13/20 03/13/21   Martinique, [name], MD  Tetrahydrozoline HCl (VISINE EXTRA OP) Place 1 drop into both eyes as needed (itching).    [provider]  traMADol (ULTRAM) 50 MG tablet Take 50 mg by mouth every 6 (six) hours as needed.    [provider]  traMADol (ULTRAM) 50 MG tablet 1 tablet as needed - per orthopedics 12/06/20   [provider]  umeclidinium-vilanterol (ANORO ELLIPTA) 62.5-25 MCG/INH AEPB Inhale 1 puff into the lungs daily. 02/23/20   Lauraine Rinne, NP  Vitamin D, Ergocalciferol, (DRISDOL) 1.25 MG (50000 UNIT) CAPS capsule Take 50,000 Units by mouth once a week. 11/22/20   [provider]    Allergies    Codeine and Duloxetine hcl  Review of Systems   Review of Systems  Gastrointestinal:  Positive for abdominal pain.  All other systems reviewed and are negative.  Physical Exam Updated Vital Signs BP 117/70   Pulse (!) 109   Temp 98.9 F (37.2 C) (Oral)   Resp (!) 24   Ht 1.651 m (5\' 5" )   Wt 82.6 kg   SpO2 95%   BMI 30.30 kg/m   Physical Exam Vitals and nursing note reviewed.  Constitutional:      General: He is not in acute distress.    Appearance: He is well-developed.  HENT:     Head: Normocephalic and atraumatic.     Nose: Nose normal. No congestion or rhinorrhea.     Mouth/Throat:     Mouth: Mucous membranes are moist.     Pharynx: No oropharyngeal exudate.  Eyes:     General: No scleral icterus.       Right eye: No discharge.        Left eye: No discharge.     Conjunctiva/sclera: Conjunctivae normal.     Pupils: Pupils are equal, round, and reactive to light.  Neck:     Thyroid: No thyromegaly.     Vascular: No JVD.  Cardiovascular:     Rate and Rhythm: Regular rhythm. Tachycardia present.     Heart sounds: Normal heart sounds. No murmur heard.   No friction rub. No gallop.  Pulmonary:  Breath sounds: Rhonchi and rales present. No wheezing.     Comments: Rales and rhonchi heard on the right side, intermittent, mild wheezing,  the patient is tachypnea Abdominal:     General: Bowel sounds are normal. There is no distension.     Palpations: Abdomen is soft. There is no mass.     Tenderness: There is abdominal tenderness.     Comments: This patient has mild abdominal distention with significant tenderness in the right side including up towards the epigastrium right upper quadrant right flank and right lower quadrant.  There is mild guarding but no peritoneal signs, left side of the abdomen is benign  Musculoskeletal:        General: No tenderness. Normal range of motion.     Cervical back: Normal range of motion and neck supple.     Right lower leg: No edema.     Left lower leg: No edema.  Lymphadenopathy:     Cervical: No cervical adenopathy.  Skin:    General: Skin is warm and dry.     Findings: No erythema or rash.  Neurological:     General: No focal deficit present.     Mental Status: He is alert.     Coordination: Coordination normal.  Psychiatric:        Behavior: Behavior normal.    ED Results / Procedures / Treatments   Labs (all labs ordered are listed, but only abnormal results are displayed) Labs Reviewed  COMPREHENSIVE METABOLIC PANEL - Abnormal; Notable for the following components:      Result Value   Sodium 133 (*)    CO2 19 (*)    Glucose, Bld 237 (*)    AST 56 (*)    All other components within normal limits  URINALYSIS, ROUTINE W REFLEX MICROSCOPIC - Abnormal; Notable for the following components:   APPearance HAZY (*)    Glucose, UA 150 (*)    Ketones, ur 5 (*)    All other components within normal limits  RESP PANEL BY RT-PCR (FLU A&B, COVID) ARPGX2  CBC WITH DIFFERENTIAL/PLATELET  LIPASE, BLOOD  TROPONIN I (HIGH SENSITIVITY)    EKG EKG Interpretation  Date/Time:  Sunday January 09 2021 19:20:39 EDT Ventricular Rate:  125 PR Interval:  136 QRS Duration: 82 QT Interval:  320 QTC Calculation: 461 R Axis:   38 Text Interpretation: Sinus tachycardia ST & T wave  abnormality, consider inferior ischemia Abnormal ECG Confirmed by Ripley Fraise [phone]) on 01/09/2021 11:13:36 PM  Radiology CT Angio Chest PE W and/or Wo Contrast  Addendum Date: 01/09/2021   ADDENDUM REPORT: 01/09/2021 22:06 ADDENDUM: Markedly prominent caudate lobe with underlying lesion not excluded. Recommend right upper quadrant ultrasound further evaluation. These results will be called to the ordering clinician or representative by the Radiologist Assistant, and communication documented in the PACS or [employer]. Electronically Signed   By: Iven Finn M.D.   On: 01/09/2021 22:06   Result Date: 01/09/2021 CLINICAL DATA:  Pt has been experiencing abdominal and shoulder pain to the R side. R lower quadrant pain radiating to flank area. Pt did have a cardiac cath about 5 days ago with entry through R radial. PE suspected, high prob Abdominal pain, acute, nonlocalized R sided lower abd pain EXAM: CT ANGIOGRAPHY CHEST CT ABDOMEN AND PELVIS WITH CONTRAST TECHNIQUE: Multidetector CT imaging of the chest was performed using the standard protocol during bolus administration of intravenous contrast. Multiplanar CT image reconstructions and MIPs were obtained to evaluate the vascular  anatomy. Multidetector CT imaging of the abdomen and pelvis was performed using the standard protocol during bolus administration of intravenous contrast. CONTRAST:  135mL OMNIPAQUE IOHEXOL 350 MG/ML SOLN COMPARISON:  None. FINDINGS: CTA CHEST FINDINGS Cardiovascular: Fairly satisfactory opacification of the pulmonary arteries to the segmental level. No evidence of central or segmental pulmonary embolism. Normal heart size. No pericardial effusion. At least moderate atherosclerotic plaque of the thoracic aorta. Four-vessel coronary artery calcifications. Mediastinum/Nodes: 1.2 cm subcarinal lymph node. 1 cm superior mediastinal lymph node (3:14). No enlarged hilar or axillary lymph nodes. Thyroid gland, trachea, and  esophagus demonstrate no significant findings. Lungs/Pleura: Expiratory phase of respiration. Bilateral lower lobe subsegmental atelectasis. No focal consolidation. No pulmonary nodule. No pulmonary mass. No pleural effusion. No pneumothorax. Musculoskeletal: No chest wall abnormality. No suspicious lytic or blastic osseous lesions. No acute displaced fracture. Review of the MIP images confirms the above findings. CT ABDOMEN and PELVIS FINDINGS Hepatobiliary: Trace perihepatic fat stranding of unclear etiology along the inferior hepatic lobe. No focal liver abnormality. No gallstones, gallbladder wall thickening, or pericholecystic fluid. No biliary dilatation. Pancreas: No focal lesion. Normal pancreatic contour. No surrounding inflammatory changes. No main pancreatic ductal dilatation. Spleen: Enlarged spleen measuring up to 13.5 cm.  No focal lesion. Adrenals/Urinary Tract: No adrenal nodule bilaterally. Bilateral kidneys enhance symmetrically. No hydronephrosis. No hydroureter. The urinary bladder is unremarkable. Stomach/Bowel: Stomach is within normal limits. No evidence of bowel wall thickening or dilatation. Scattered colonic diverticulosis most prominent in the sigmoid colon. Appendix appears normal. Vascular/Lymphatic: Question penetrating atheromatous ulcer versus focal dissection versus noncalcified atherosclerotic plaque of the infrarenal abdominal aorta (5:46 extending) for approximately 1.5 Cm in the craniocaudal dimension. No associated periaortic fat stranding. No abdominal aorta or iliac aneurysm. Severe atherosclerotic plaque of the aorta and its branches. No abdominal, pelvic, or inguinal lymphadenopathy. Reproductive: Status post hysterectomy. No adnexal masses. Other: No intraperitoneal free fluid. No intraperitoneal free gas. No organized fluid collection. Musculoskeletal: No abdominal wall hernia or abnormality. No suspicious lytic or blastic osseous lesions. No acute displaced fracture.  Grade 1 anterolisthesis of L5 on S1 with bilateral interarticularis defects at the L5 level. Intervertebral disc space vacuum phenomenon and endplate sclerosis at this level. Review of the MIP images confirms the above findings. IMPRESSION: 1. No central or segmental pulmonary embolus. 2. Couple of enlarged nonspecific nodes. 3. Scattered colonic diverticulosis with no acute diverticulitis. Stool throughout the colon. 4. Borderline enlarged spleen. 5. Question penetrating atheromatous ulcer versus focal dissection versus large noncalcified atherosclerotic plaque of the infrarenal abdominal aorta. No associated periaortic fat stranding to suggest imminent rupture. No associated aneurysmal dilatation. Correlate with prior cross-sectional imaging. 6. Aortic Atherosclerosis (ICD10-I70.0). Electronically Signed: By: Iven Finn M.D. On: 01/09/2021 21:43   CT ABDOMEN PELVIS W CONTRAST  Addendum Date: 01/09/2021   ADDENDUM REPORT: 01/09/2021 22:06 ADDENDUM: Markedly prominent caudate lobe with underlying lesion not excluded. Recommend right upper quadrant ultrasound further evaluation. These results will be called to the ordering clinician or representative by the Radiologist Assistant, and communication documented in the PACS or [employer]. Electronically Signed   By: Iven Finn M.D.   On: 01/09/2021 22:06   Result Date: 01/09/2021 CLINICAL DATA:  Pt has been experiencing abdominal and shoulder pain to the R side. R lower quadrant pain radiating to flank area. Pt did have a cardiac cath about 5 days ago with entry through R radial. PE suspected, high prob Abdominal pain, acute, nonlocalized R sided lower abd pain EXAM: CT ANGIOGRAPHY CHEST CT  ABDOMEN AND PELVIS WITH CONTRAST TECHNIQUE: Multidetector CT imaging of the chest was performed using the standard protocol during bolus administration of intravenous contrast. Multiplanar CT image reconstructions and MIPs were obtained to evaluate the vascular  anatomy. Multidetector CT imaging of the abdomen and pelvis was performed using the standard protocol during bolus administration of intravenous contrast. CONTRAST:  160mL OMNIPAQUE IOHEXOL 350 MG/ML SOLN COMPARISON:  None. FINDINGS: CTA CHEST FINDINGS Cardiovascular: Fairly satisfactory opacification of the pulmonary arteries to the segmental level. No evidence of central or segmental pulmonary embolism. Normal heart size. No pericardial effusion. At least moderate atherosclerotic plaque of the thoracic aorta. Four-vessel coronary artery calcifications. Mediastinum/Nodes: 1.2 cm subcarinal lymph node. 1 cm superior mediastinal lymph node (3:14). No enlarged hilar or axillary lymph nodes. Thyroid gland, trachea, and esophagus demonstrate no significant findings. Lungs/Pleura: Expiratory phase of respiration. Bilateral lower lobe subsegmental atelectasis. No focal consolidation. No pulmonary nodule. No pulmonary mass. No pleural effusion. No pneumothorax. Musculoskeletal: No chest wall abnormality. No suspicious lytic or blastic osseous lesions. No acute displaced fracture. Review of the MIP images confirms the above findings. CT ABDOMEN and PELVIS FINDINGS Hepatobiliary: Trace perihepatic fat stranding of unclear etiology along the inferior hepatic lobe. No focal liver abnormality. No gallstones, gallbladder wall thickening, or pericholecystic fluid. No biliary dilatation. Pancreas: No focal lesion. Normal pancreatic contour. No surrounding inflammatory changes. No main pancreatic ductal dilatation. Spleen: Enlarged spleen measuring up to 13.5 cm.  No focal lesion. Adrenals/Urinary Tract: No adrenal nodule bilaterally. Bilateral kidneys enhance symmetrically. No hydronephrosis. No hydroureter. The urinary bladder is unremarkable. Stomach/Bowel: Stomach is within normal limits. No evidence of bowel wall thickening or dilatation. Scattered colonic diverticulosis most prominent in the sigmoid colon. Appendix appears  normal. Vascular/Lymphatic: Question penetrating atheromatous ulcer versus focal dissection versus noncalcified atherosclerotic plaque of the infrarenal abdominal aorta (5:46 extending) for approximately 1.5 Cm in the craniocaudal dimension. No associated periaortic fat stranding. No abdominal aorta or iliac aneurysm. Severe atherosclerotic plaque of the aorta and its branches. No abdominal, pelvic, or inguinal lymphadenopathy. Reproductive: Status post hysterectomy. No adnexal masses. Other: No intraperitoneal free fluid. No intraperitoneal free gas. No organized fluid collection. Musculoskeletal: No abdominal wall hernia or abnormality. No suspicious lytic or blastic osseous lesions. No acute displaced fracture. Grade 1 anterolisthesis of L5 on S1 with bilateral interarticularis defects at the L5 level. Intervertebral disc space vacuum phenomenon and endplate sclerosis at this level. Review of the MIP images confirms the above findings. IMPRESSION: 1. No central or segmental pulmonary embolus. 2. Couple of enlarged nonspecific nodes. 3. Scattered colonic diverticulosis with no acute diverticulitis. Stool throughout the colon. 4. Borderline enlarged spleen. 5. Question penetrating atheromatous ulcer versus focal dissection versus large noncalcified atherosclerotic plaque of the infrarenal abdominal aorta. No associated periaortic fat stranding to suggest imminent rupture. No associated aneurysmal dilatation. Correlate with prior cross-sectional imaging. 6. Aortic Atherosclerosis (ICD10-I70.0). Electronically Signed: By: Iven Finn M.D. On: 01/09/2021 21:43   DG Chest Portable 1 View  Result Date: 01/09/2021 CLINICAL DATA:  Cough. EXAM: PORTABLE CHEST 1 VIEW COMPARISON:  July 14, 2020. FINDINGS: The heart size and mediastinal contours are within normal limits. Both lungs are clear. The visualized skeletal structures are unremarkable. IMPRESSION: No active disease. Electronically Signed   By: Marijo Conception M.D.   On: 01/09/2021 18:52   US Abdomen Limited RUQ (LIVER/GB)  Result Date: 01/09/2021 CLINICAL DATA:  RIGHT upper quadrant pain EXAM: ULTRASOUND ABDOMEN LIMITED RIGHT UPPER QUADRANT COMPARISON:  June 16, 2016 FINDINGS:  Evaluation is limited secondary to patient inability to tolerate positioning. Gallbladder: No gallstones or wall thickening visualized. No sonographic Murphy sign noted by sonographer. Common bile duct: Diameter: 5 mm, normal Liver: No focal lesion identified. Diffusely increased parenchymal echogenicity with underlying heterogeneity. Mildly nodular contour. Portal vein is patent on color Doppler imaging with normal direction of blood flow towards the liver. Other: None. IMPRESSION: Heterogeneous appearance of the liver with suggestion of nodular contour may reflect underlying cirrhosis. Electronically Signed   By: Valentino Saxon M.D.   On: 01/09/2021 18:32    Procedures Procedures   Medications Ordered in ED Medications  ipratropium-albuterol (DUONEB) 0.5-2.5 (3) MG/3ML nebulizer solution 3 mL (has no administration in time range)  morphine 4 MG/ML injection 4 mg (has no administration in time range)  albuterol (PROVENTIL) (2.5 MG/3ML) 0.083% nebulizer solution 10 mg (10 mg Nebulization Given 01/09/21 1844)  iohexol (OMNIPAQUE) 350 MG/ML injection 100 mL (100 mLs Intravenous Contrast Given 01/09/21 2108)    ED Course  I have reviewed the triage vital signs and the nursing notes.  Pertinent labs & imaging results that were available during my care of the patient were reviewed by me and considered in my medical decision making (see chart for details).    MDM Rules/Calculators/A&P                           This patient appears to have some right-sided abdominal discomfort, he states this is different than the chest pain that he been having with his heart issues.  He does feel short of breath, he has been coughing, his pain seems to be around the right diaphragm which  may be causing some right shoulder pain.  Would consider gallbladder issue, kidney issue, appendicitis, bowel obstruction, unfortunately the patient is very complicated with his recent cardiac history will need to have a broad work-up including cardiac, pulmonary, and abdominal causes of his discomfort.  I discussed the case with the radiologist, there does appear to be a slight abnormality in the aorta on the CT scans though thankfully there is no signs of pulmonary embolism, no signs of other intra-abdominal pathology.  The patient's heart rate on my exam is now down about 98 bpm, his oxygen level is 92 to 93% on his nasal cannula, he will get another breathing treatment, he will get some pain medicine for his abdomen however I do not see a definite cause of the abdominal pain on the CT scan.  I did discuss the CT scan findings with Dr. Donzetta Matters of the vascular surgery service who is currently in the operating room and has been kind enough to come and see the patient when he is completed of the case.  At change of shift care signed out to Dr. Christy Gentles to follow-up recommendations from the vascular service and disposition accordingly  Final Clinical Impression(s) / ED Diagnoses Final diagnoses:  RUQ abdominal pain  Shortness of breath     Noemi Chapel, MD 01/09/21 2321

## 2021-01-09 NOTE — ED Notes (Signed)
Patient transported to Ultrasound 

## 2021-01-09 NOTE — ED Notes (Signed)
Pt finished breathing treatment. Breathing is not reported to be easier. Pt requesting supplemental oxygen. Saturation now reads 93% RA. [place] applied @ 3LPM.

## 2021-01-09 NOTE — ED Triage Notes (Signed)
Pt BIB [facility] from home. Pt has been experiencing abdominal and shoulder pain to the R side. R lower quadrant pain radiating to flank area. Pt did have a cardiac cath about 5 days ago with entry through R radial.   VSS w/ EMS. AOX4.

## 2021-01-09 NOTE — ED Notes (Signed)
Patient now complaining of acute onset of chest pain centrally describing it as a pressure. Pt also reports worsening shortness of breath at this time requiring increased need for supplemental O2 via [place] now at 5 L. Pt becoming more tachycardic w/ HR in the 120s and diaphoretic. Dr. Sabra Heck notified of these new findings and placing orders for CT angio and troponin received. Will continue to monitor this situation.

## 2021-01-10 DIAGNOSIS — I7102 Dissection of abdominal aorta: Secondary | ICD-10-CM | POA: Diagnosis not present

## 2021-01-10 DIAGNOSIS — Z87891 Personal history of nicotine dependence: Secondary | ICD-10-CM | POA: Diagnosis not present

## 2021-01-10 MED ORDER — HYDROCODONE-ACETAMINOPHEN 5-325 MG PO TABS: 1.0000 | ORAL_TABLET | Freq: Four times a day (QID) | ORAL | 0 refills | Status: DC | PRN

## 2021-01-10 NOTE — Consult Note (Signed)
Hospital Consult    Reason for Consult:  abdominal pain Referring Physician:  Dr. Christy Gentles MRN #:  [id number]  History of Present Illness: This is a 70 y.o. male history of coronary artery disease recent underwent cardiac catheterization is supposed to see cardiothoracic surgery.  He states that he has had right hip pain radiating up to his right shoulder.  He is afraid this is related to stress as he has been frustrated with his kids and frustrated that his appointment was canceled out of his control.  He does not have any frank abdominal or chest pain at this time.  He has never had pain like this before.  He denies any previous abdominal surgery or injury.  He does not have any personal or family history of aneurysm.  He does walk although he says that he is unstable at times.  He has difficulty breathing with any activity.  He is not on home oxygen.  Past Medical History:  Diagnosis Date   CAD S/P percutaneous coronary angioplasty -- D1, Xience Xpedition DES 2.5 mm x 33 mm 11/24/2011   a) Mild-to-moderate 30-40% lesions in the RCA, LAD and Circumflex. b) CULPRIT LESION: long tubular 70-80% lesion in D1 with FFR of 0.7 --> PCI w/ Xience Xpedition DES 2.5 mm x 30 mm (2.65 MM); c) Lexiscan Myoview 11/2013: No Ischemia or Infarct (Inferior Gut Attenuation) EF 63%.   COPD (chronic obstructive pulmonary disease) ([place])    "don't have full case of it; I'm right there at it"   Diabetes mellitus without complication ([place])    Essential hypertension 11/24/2011   Exertional dyspnea, chronic    Gout    History of Unstable angina 11/24/2011   Referred for cardiac catheterization   Hyperlipidemia with target LDL less than 70 11/24/2011   Obesity (BMI 30.0-34.9)    OSA (obstructive sleep apnea), uses oxygen at home did not tolerate cpap 11/24/2011    Past Surgical History:  Procedure Laterality Date   CERVICAL SPINE SURGERY  2012   CORONARY ANGIOPLASTY WITH STENT PLACEMENT  11/23/2011   "1; first one"   LEFT  HEART CATH AND CORONARY ANGIOGRAPHY N/A 12/28/2020   Procedure: LEFT HEART CATH AND CORONARY ANGIOGRAPHY;  Surgeon: Leonie Man, MD;  Location: [facility];  Service: Cardiovascular;  Laterality: N/A;   LEFT HEART CATHETERIZATION WITH CORONARY ANGIOGRAM N/A 11/23/2011   Procedure: LEFT HEART CATHETERIZATION WITH CORONARY ANGIOGRAM;  Surgeon: Leonie Man, MD;  Location: [facility];  Service: Cardiovascular;  Laterality: N/A;    Allergies  Allergen Reactions   Codeine Itching   Duloxetine Hcl Other (See Comments)    Took at night and still felt very lethargic the following day.  (malaise)  PT DOES NOT REMEMBER TAKING MED     Prior to Admission medications   Medication Sig Start Date End Date Taking? Authorizing Provider  acetaminophen (TYLENOL) 650 MG CR tablet Take 1,950 mg by mouth daily as needed for pain.     [provider]  albuterol (PROVENTIL) (2.5 MG/3ML) 0.083% nebulizer solution take 3 mls (2.5mg ) total by neb q4h PRN for wheezing or SOB. 05/28/18   [provider]  allopurinol (ZYLOPRIM) 100 MG tablet Take 100 mg by mouth daily.    [provider]  amLODipine (NORVASC) 5 MG tablet Take 5 mg by mouth daily. 07/01/18   [provider]  benzonatate (TESSALON) 100 MG capsule Take by mouth 3 (three) times daily as needed for cough.    [provider]  clopidogrel (PLAVIX) 75 MG tablet Take 1 tablet (75 mg total) by mouth daily. 05/22/18   Angiulli, Lavon Paganini, PA-C  diclofenac Sodium (VOLTAREN) 1 % GEL Apply 2 g topically 2 (two) times daily as needed (shoulder pain).    [provider]  Ergocalciferol (VITAMIN D2 PO) Take 5,000 Units by mouth daily.    [provider]  furosemide (LASIX) 20 MG tablet Take 20 mg by mouth daily.    [provider]  furosemide (LASIX) 20 MG tablet Take 1 tablet by mouth daily.    [provider]  gabapentin (NEURONTIN) 300 MG capsule Take 600 mg by mouth 2 (two)  times daily.    [provider]  Insulin Glargine (BASAGLAR KWIKPEN) 100 UNIT/ML Inject 40 Units into the skin daily. 05/18/20   [provider]  insulin lispro (HUMALOG) 100 UNIT/ML KwikPen Inject 6-10 Units into the skin See admin instructions. 10 units in the morning, 6 units with lunch, and 6 units with supper    [provider]  loratadine (CLARITIN) 10 MG tablet Take 1 tablet (10 mg total) by mouth daily. 05/22/18   Angiulli, Lavon Paganini, PA-C  metFORMIN (GLUCOPHAGE-XR) 500 MG 24 hr tablet Take 1,000 mg by mouth in the morning and at bedtime.    [provider]  metoprolol tartrate (LOPRESSOR) 25 MG tablet Take 1 tablet (25 mg total) by mouth 2 (two) times daily. 05/22/18   Angiulli, Lavon Paganini, PA-C  nitroGLYCERIN (NITROSTAT) 0.4 MG SL tablet Place 1 tablet (0.4 mg total) under the tongue every 5 (five) minutes as needed for chest pain. 11/24/11 03/11/19  Isaiah Serge, NP  omeprazole (PRILOSEC) 40 MG capsule Take 40 mg by mouth daily.    [provider]  rosuvastatin (CRESTOR) 20 MG tablet Take 1 tablet (20 mg total) by mouth daily. 12/13/20 03/13/21  [place], [name], MD  Tetrahydrozoline HCl (VISINE EXTRA OP) Place 1 drop into both eyes as needed (itching).    [provider]  traMADol (ULTRAM) 50 MG tablet Take 50 mg by mouth every 6 (six) hours as needed.    [provider]  traMADol (ULTRAM) 50 MG tablet 1 tablet as needed - per orthopedics 12/06/20   [provider]  umeclidinium-vilanterol (ANORO ELLIPTA) 62.5-25 MCG/INH AEPB Inhale 1 puff into the lungs daily. 02/23/20   Lauraine Rinne, NP  Vitamin D, Ergocalciferol, (DRISDOL) 1.25 MG (50000 UNIT) CAPS capsule Take 50,000 Units by mouth once a week. 11/22/20   [provider]    Social History   Socioeconomic History   Marital status: Divorced    Spouse name: Not on file   Number of children: Not on file   Years of education: Not on file   Highest education level:  Not on file  Occupational History   Not on file  Tobacco Use   Smoking status: Former    Packs/day: 1.00    Years: 40.00    Pack years: 40.00    Types: Cigarettes    Quit date: 06/10/2016    Years since quitting: 4.5   Smokeless tobacco: Never   Tobacco comments:    02/18/14- smokes occ cig maybe 3 x per wk  Substance and Sexual Activity   Alcohol use: Yes    Alcohol/week: 0.0 standard drinks    Comment: social 2-3 drink a week   Drug use: No   Sexual activity: Not Currently  Other Topics Concern   Not on file  Social  History Narrative   He is a 70 y.o. divorced father of 84, grandfather 1.   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   He quit smoking in August 2013, after smoking a pack a day for roughly 40 years.   He drinks socially 2-3 drinks a week only.   He does not get routine exercise, mostly due to 2 fatigue and dyspnea. Otherwise been relatively sedentary.   Social Determinants of Health   Financial Resource Strain: Not on file  Food Insecurity: Not on file  Transportation Needs: Not on file  Physical Activity: Not on file  Stress: Not on file  Social Connections: Not on file  Intimate Partner Violence: Not on file    Family History  Problem Relation Age of Onset   Heart disease Sister    Heart attack Brother    Emphysema Father        smoked   Aneurysm Father     Review of Systems  Constitutional: Negative.   HENT: Negative.    Respiratory: Negative.    Cardiovascular:  Positive for chest pain.  Gastrointestinal:  Positive for abdominal pain.  Musculoskeletal: Negative.   Skin: Negative.   Neurological: Negative.   Psychiatric/Behavioral: Negative.      Physical Examination  Vitals:   01/09/21 2300 01/09/21 2316  BP: 124/84 (!) 147/72  Pulse: (!) 103 (!) 101  Resp: 20 18  Temp:    SpO2: 92% 91%   Body  mass index is 30.3 kg/m.  Physical Exam HENT:     Head: Normocephalic.  Cardiovascular:     Rate and Rhythm: Normal rate.  Pulmonary:     Effort: Pulmonary effort is normal.  Abdominal:     General: Abdomen is flat. There is no distension.     Palpations: Abdomen is soft.  Skin:    General: Skin is warm and dry.     Capillary Refill: Capillary refill takes less than 2 seconds.  Neurological:     General: No focal deficit present.     Mental Status: He is alert.  Psychiatric:        Mood and Affect: Mood normal.        Behavior: Behavior normal.     CBC    Component Value Date/Time   WBC 10.0 01/09/2021 1809   RBC 4.40 01/09/2021 1809   HGB 13.2 01/09/2021 1809   HGB 12.9 (L) 12/21/2020 0824   HCT 40.3 01/09/2021 1809   HCT 38.6 12/21/2020 0824   PLT 158 01/09/2021 1809   PLT 148 (L) 12/21/2020 0824   MCV 91.6 01/09/2021 1809   MCV 86 12/21/2020 0824   MCH 30.0 01/09/2021 1809   MCHC 32.8 01/09/2021 1809   RDW 13.9 01/09/2021 1809   RDW 13.5 12/21/2020 0824   LYMPHSABS 2.2 01/09/2021 1809   LYMPHSABS 2.0 12/21/2020 0824   MONOABS 0.7 01/09/2021 1809   EOSABS 0.2 01/09/2021 1809   EOSABS 0.2 12/21/2020 0824   BASOSABS 0.1 01/09/2021 1809   BASOSABS 0.1 12/21/2020 0824    BMET    Component Value Date/Time   NA 133 (L) 01/09/2021 1809   NA 139 12/21/2020 0824   K 4.6 01/09/2021 1809   CL 102 01/09/2021 1809   CO2 19 (L) 01/09/2021 1809   GLUCOSE 237 (H) 01/09/2021 1809   BUN 15 01/09/2021 1809   BUN 15 12/21/2020 0824  CREATININE 1.10 01/09/2021 1809   CALCIUM 9.4 01/09/2021 1809   GFRNONAA >60 01/09/2021 1809   GFRAA >60 03/13/2019 1459    COAGS: Lab Results  Component Value Date   INR 1.1 12/21/2020   INR 1.10 11/23/2011   INR 1.04 08/11/2010     Non-Invasive Vascular Imaging:   CT IMPRESSION: 1. No central or segmental pulmonary embolus. 2. Couple of enlarged nonspecific nodes. 3. Scattered colonic diverticulosis with no acute  diverticulitis. Stool throughout the colon. 4. Borderline enlarged spleen. 5. Question penetrating atheromatous ulcer versus focal dissection versus large noncalcified atherosclerotic plaque of the infrarenal abdominal aorta. No associated periaortic fat stranding to suggest imminent rupture. No associated aneurysmal dilatation. Correlate with prior cross-sectional imaging. 6. Aortic Atherosclerosis (ICD10-I70.0).   ASSESSMENT/PLAN: This is a 70 y.o. male with what appears to be chronic focal dissection or PACU.  Max diameter of his aorta at the site of irregularity 2.4 cm.  Unlikely to be related to his presenting symptoms.  Given unknown etiology and timeline we will have him follow-up in 6 months with repeat CT scan.  Chantia Amalfitano C. Donzetta Matters, MD [facility] Office: [phone] Pager: [phone]

## 2021-01-10 NOTE — Discharge Instructions (Addendum)

## 2021-01-10 NOTE — ED Provider Notes (Signed)
Patient has been seen by Dr. Donzetta Matters with vascular surgery He does not feel this represents an acute vascular emergency and he can be discharged home with  follow up in about 6 months. Patient is awake/alert and in no acute distress.  He denies any chest pain at this time.  There is no focal abdominal tenderness He has follow-up plans with CT surgery later this week due to severe three-vessel CAD. Patient requests short course of pain medication in case he has any pain at home   Ripley Fraise, MD 01/10/21 0111

## 2021-01-10 NOTE — Progress Notes (Deleted)
Cardiology Clinic Note   Patient Name: Paul [name] Date of Encounter: 01/10/2021  Primary Care Provider:  Leanna Battles, MD Primary Cardiologist:  Glenetta Hew, MD  Patient Profile    Paul [name] 70 year old male presents the clinic today for follow-up evaluation of his coronary artery disease status post cardiac catheterization on 12/28/2020.  Medical management recommended.  Past Medical History    Past Medical History:  Diagnosis Date   CAD S/P percutaneous coronary angioplasty -- D1, Xience Xpedition DES 2.5 mm x 33 mm 11/24/2011   a) Mild-to-moderate 30-40% lesions in the RCA, LAD and Circumflex. b) CULPRIT LESION: long tubular 70-80% lesion in D1 with FFR of 0.7 --> PCI w/ Xience Xpedition DES 2.5 mm x 30 mm (2.65 MM); c) Lexiscan Myoview 11/2013: No Ischemia or Infarct (Inferior Gut Attenuation) EF 63%.   COPD (chronic obstructive pulmonary disease) ([place])    "don't have full case of it; I'm right there at it"   Diabetes mellitus without complication ([place])    Essential hypertension 11/24/2011   Exertional dyspnea, chronic    Gout    History of Unstable angina 11/24/2011   Referred for cardiac catheterization   Hyperlipidemia with target LDL less than 70 11/24/2011   Obesity (BMI 30.0-34.9)    OSA (obstructive sleep apnea), uses oxygen at home did not tolerate cpap 11/24/2011   Past Surgical History:  Procedure Laterality Date   CERVICAL SPINE SURGERY  2012   CORONARY ANGIOPLASTY WITH STENT PLACEMENT  11/23/2011   "1; first one"   LEFT HEART CATH AND CORONARY ANGIOGRAPHY N/A 12/28/2020   Procedure: LEFT HEART CATH AND CORONARY ANGIOGRAPHY;  Surgeon: Leonie Man, MD;  Location: [facility];  Service: Cardiovascular;  Laterality: N/A;   LEFT HEART CATHETERIZATION WITH CORONARY ANGIOGRAM N/A 11/23/2011   Procedure: LEFT HEART CATHETERIZATION WITH CORONARY ANGIOGRAM;  Surgeon: Leonie Man, MD;  Location: [facility] CATH LAB;  Service: Cardiovascular;  Laterality: N/A;     Allergies  Allergies  Allergen Reactions   Codeine Itching   Duloxetine Hcl Other (See Comments)    Took at night and still felt very lethargic the following day.  (malaise)  PT DOES NOT REMEMBER TAKING MED     History of Present Illness    Paul [name] has a PMH of coronary artery disease, COPD, diabetes, and hypertension.  His PMH also includes obstructive sleep apnea, tobacco abuse, and hyperlipidemia.  Previous nuclear stress test showed no ischemia and his echocardiogram showed normal LV function.  He was weaned off of his beta-blocker due to his COPD.  He was admitted to the hospital January 2020 with acute respiratory failure which required intubation.  He was found to have right lower lobe pneumonia.  His hospital course was complicated by bacteremia, encephalopathy, and prolonged debility.  He followed up with Dr. Martinique and reported chest discomfort over the prior 6-8 months with increased physical activity.  It would take approximately 30 minutes for symptoms to resolve.  He did notice some improvement with sublingual nitroglycerin.  He did note some shortness of breath.  He was not able to be very physically active due to his symptoms.  He reported that he has stopped smoking 6 to 7 years prior.  He was instructed not to take aspirin due to his bleeding history.  He was compliant with Plavix.  He underwent cardiac catheterization 12/28/2020 which showed proximal-mid LAD 65% stenosis, 30% stenosis first diagonal, 100% in-stent restenosis first diagonal, mid-distal LAD 75% stenosis, ostial-mid circumflex  55% stenosis, mid circumflex lesion 90% stenosis, proximal RCA 45% stenosis.  Consult to CV TS was recommended.  It was felt that due to his severe two-vessel coronary artery disease and history of PCI to his diagonal vessel he would be a good candidate for CABG.  He was noted to have preserved biventricular function.  A referral was made to Dr. Kipp Brood.  He presented to the  emergency department on 01/09/2021 with complaints of shortness of breath and right upper quadrant pain.  His chest CT was negative for PE and his abdominal CT showed no signs of intra-abdominal pathology.  His heart rate was decreased to 98 bpm, he was saturating in the mid 90s on nasal cannula.  It was felt that his CT scan results needed to be reviewed with vascular surgery.  He presents the clinic today for follow-up evaluation states***  *** denies chest pain, shortness of breath, lower extremity edema, fatigue, palpitations, melena, hematuria, hemoptysis, diaphoresis, weakness, presyncope, syncope, orthopnea, and PND.   Home Medications    Prior to Admission medications   Medication Sig Start Date End Date Taking? Authorizing Provider  acetaminophen (TYLENOL) 650 MG CR tablet Take 1,950 mg by mouth daily as needed for pain.     [provider]  albuterol (PROVENTIL) (2.5 MG/3ML) 0.083% nebulizer solution take 3 mls (2.5mg ) total by neb q4h PRN for wheezing or SOB. 05/28/18   [provider]  allopurinol (ZYLOPRIM) 100 MG tablet Take 100 mg by mouth daily.    [provider]  amLODipine (NORVASC) 5 MG tablet Take 5 mg by mouth daily. 07/01/18   [provider]  benzonatate (TESSALON) 100 MG capsule Take by mouth 3 (three) times daily as needed for cough.    [provider]  clopidogrel (PLAVIX) 75 MG tablet Take 1 tablet (75 mg total) by mouth daily. 05/22/18   Angiulli, Lavon Paganini, PA-C  diclofenac Sodium (VOLTAREN) 1 % GEL Apply 2 g topically 2 (two) times daily as needed (shoulder pain).    [provider]  Ergocalciferol (VITAMIN D2 PO) Take 5,000 Units by mouth daily.    [provider]  furosemide (LASIX) 20 MG tablet Take 20 mg by mouth daily.    [provider]  furosemide (LASIX) 20 MG tablet Take 1 tablet by mouth daily.    [provider]  gabapentin (NEURONTIN) 300 MG capsule Take 600 mg by mouth 2 (two)  times daily.    [provider]  HYDROcodone-acetaminophen (NORCO/VICODIN) 5-325 MG tablet Take 1 tablet by mouth every 6 (six) hours as needed for severe pain. 01/10/21   Ripley Fraise, MD  Insulin Glargine [facility]) 100 UNIT/ML Inject 40 Units into the skin daily. 05/18/20   [provider]  insulin lispro (HUMALOG) 100 UNIT/ML KwikPen Inject 6-10 Units into the skin See admin instructions. 10 units in the morning, 6 units with lunch, and 6 units with supper    [provider]  loratadine (CLARITIN) 10 MG tablet Take 1 tablet (10 mg total) by mouth daily. 05/22/18   Angiulli, Lavon Paganini, PA-C  metFORMIN (GLUCOPHAGE-XR) 500 MG 24 hr tablet Take 1,000 mg by mouth in the morning and at bedtime.    [provider]  metoprolol tartrate (LOPRESSOR) 25 MG tablet Take 1 tablet (25 mg total) by mouth 2 (two) times daily. 05/22/18   Angiulli, Lavon Paganini, PA-C  nitroGLYCERIN (NITROSTAT) 0.4 MG SL tablet Place 1 tablet (0.4 mg total) under the tongue every 5 (five) minutes  as needed for chest pain. 11/24/11 03/11/19  Isaiah Serge, NP  omeprazole (PRILOSEC) 40 MG capsule Take 40 mg by mouth daily.    [provider]  rosuvastatin (CRESTOR) 20 MG tablet Take 1 tablet (20 mg total) by mouth daily. 12/13/20 03/13/21  Martinique, [name], MD  Tetrahydrozoline HCl (VISINE EXTRA OP) Place 1 drop into both eyes as needed (itching).    [provider]  traMADol (ULTRAM) 50 MG tablet Take 50 mg by mouth every 6 (six) hours as needed.    [provider]  traMADol (ULTRAM) 50 MG tablet 1 tablet as needed - per orthopedics 12/06/20   [provider]  umeclidinium-vilanterol (ANORO ELLIPTA) 62.5-25 MCG/INH AEPB Inhale 1 puff into the lungs daily. 02/23/20   Lauraine Rinne, NP  Vitamin D, Ergocalciferol, (DRISDOL) 1.25 MG (50000 UNIT) CAPS capsule Take 50,000 Units by mouth once a week. 11/22/20   [provider]    Family History    Family History   Problem Relation Age of Onset   Heart disease Sister    Heart attack Brother    Emphysema Father        smoked   Aneurysm Father    He indicated that his mother is deceased. He indicated that his father is deceased. He indicated that both of his sisters are alive. He indicated that only one of his two brothers is alive. He indicated that his daughter is alive. He indicated that his son is alive.  Social History    Social History   Socioeconomic History   Marital status: Divorced    Spouse name: Not on file   Number of children: Not on file   Years of education: Not on file   Highest education level: Not on file  Occupational History   Not on file  Tobacco Use   Smoking status: Former    Packs/day: 1.00    Years: 40.00    Pack years: 40.00    Types: Cigarettes    Quit date: 06/10/2016    Years since quitting: 4.5   Smokeless tobacco: Never   Tobacco comments:    02/18/14- smokes occ cig maybe 3 x per wk  Substance and Sexual Activity   Alcohol use: Yes    Alcohol/week: 0.0 standard drinks    Comment: social 2-3 drink a week   Drug use: No   Sexual activity: Not Currently  Other Topics Concern   Not on file  Social History Narrative   He is a 70 y.o. divorced father of 34, grandfather 1.   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   He quit smoking in August 2013, after smoking a pack a day for roughly 40 years.   He drinks socially 2-3 drinks a week only.   He does not get routine exercise, mostly due to 2 fatigue and dyspnea. Otherwise been relatively sedentary.   Social Determinants of Health   Financial Resource Strain: Not on file  Food Insecurity: Not on file  Transportation Needs: Not on file  Physical Activity: Not on file  Stress: Not on file  Social Connections: Not on file  Intimate Partner Violence: Not on file      Review of Systems    General:  No chills, fever, night sweats or weight changes.  Cardiovascular:  No chest pain, dyspnea on exertion, edema, orthopnea, palpitations, paroxysmal  nocturnal dyspnea. Dermatological: No rash, lesions/masses Respiratory: No cough, dyspnea Urologic: No hematuria, dysuria Abdominal:   No nausea, vomiting, diarrhea, bright red blood per rectum, melena, or hematemesis Neurologic:  No visual changes, wkns, changes in mental status. All other systems reviewed and are otherwise negative except as noted above.  Physical Exam    VS:  There were no vitals taken for this visit. , BMI There is no height or weight on file to calculate BMI. GEN: Well nourished, well developed, in no acute distress. HEENT: normal. Neck: Supple, no JVD, carotid bruits, or masses. Cardiac: RRR, no murmurs, rubs, or gallops. No clubbing, cyanosis, edema.  Radials/DP/PT 2+ and equal bilaterally.  Respiratory:  Respirations regular and unlabored, clear to auscultation bilaterally. GI: Soft, nontender, nondistended, BS + x 4. MS: no deformity or atrophy. Skin: warm and dry, no rash. Neuro:  Strength and sensation are intact. Psych: Normal affect.  Accessory Clinical Findings    Recent Labs: 07/14/2020: TSH 2.852 01/09/2021: ALT 30; BUN 15; Creatinine, Ser 1.10; Hemoglobin 13.2; Platelets 158; Potassium 4.6; Sodium 133   Recent Lipid Panel    Component Value Date/Time   CHOL 119 12/21/2020 0824   TRIG 201 (H) 12/21/2020 0824   HDL 24 (L) 12/21/2020 0824   CHOLHDL 5.0 12/21/2020 0824   CHOLHDL 6.6 11/23/2011 0846   VLDL 29 11/23/2011 0846   LDLCALC 61 12/21/2020 0824    ECG personally reviewed by me today- *** - No acute changes  Echocardiogram 12/28/2020 IMPRESSIONS     1. Left ventricular ejection fraction, by estimation, is 60 to 65%. The  left ventricle has normal function. The left ventricle has no regional  wall motion abnormalities. Left ventricular diastolic  parameters are  consistent with Grade I diastolic  dysfunction (impaired relaxation).   2. Right ventricular systolic function is normal. The right ventricular  size is normal.   3. The mitral valve is normal in structure. No evidence of mitral valve  regurgitation. No evidence of mitral stenosis.   4. The aortic valve is normal in structure. Aortic valve regurgitation is  not visualized. No aortic stenosis is present.   5. The inferior vena cava is normal in size with greater than 50%  respiratory variability, suggesting right atrial pressure of 3 mmHg.  Cardiac catheterization 12/28/2020   Prox LAD to Mid LAD lesion is 65% stenosed with 30% stenosed side branch in 1st Diag. (RFR 0.90-0.91)   1st Diag lesion is 100% stenosed. (In-stent Re-stenosis/occlusion)   Mid LAD lesion is 40% stenosed.   Mid LAD to Dist LAD lesion is 75% stenosed. (RFR 0.74)   Ost Cx to Mid Cx lesion is 55% stenosed with 20% stenosed side branch in 1st Mrg.   Mid Cx lesion is 90% stenosed.   Prox RCA lesion is 40% stenosed.   -------------------------------------------   The left ventricular systolic function is normal.  The left ventricular ejection fraction is 50-55% by visual estimate.   LV end diastolic pressure is normal.   There is no aortic valve stenosis.   SUMMARY Severe 3 Vessel CAD:  100% ISR of Major D1 stent,  focal 65% prox LAD (@ D1) lesion - FFR 0.91 followed by 40% LAD @ SP1 then long 70% mid LAD (FFR 0.74 - SIGNIFICANT).  prox-mid LCx diffuse 55% with focal 90% just after AVG branch in LCx-Lateral OM2. Moderate Prox-mid RCA 35-40% @ bend  Poor LV Gram image, but appears to have preserved LVEF with normal LVEDP (2 D Echo ordered).  RECOMMENDATIONS With extensive LAD and LCx disease and occluded diagonal, best treatment option would likely be CVTS.  He has been on Plavix since his PCI. CVTS consult placed, 2D echo ordered, and Plavix discontinued. Anticipate outpatient CVTS clinic visit  with staged CABG after Plavix washout -> otherwise would need to consider extensive PCI of the LAD with focal PCI of LCx and attempted recanalization of occluded D1 stent Will need to continue aggressive risk factor modification/Guideline Directed Medical Management       Glenetta Hew, MD  Diagnostic Dominance: Right Intervention   Assessment & Plan   1.  Coronary artery disease-continues to have intermittent periods of chest discomfort with increased physical activity.  Reviewed angiography results.  Recommended follow-up with CVTS. Continue clopidogrel, rosuvastatin Heart healthy low-sodium diet-salty 6 given Increase physical activity as tolerated  From-12/21/2020: Cholesterol, Total 119; HDL 24; LDL Chol Calc (NIH) 61; Triglycerides 201 reports bleeding history with aspirin. Continue rosuvastatin, Plavix Heart healthy low-sodium diet-salty 6 given Increase physical activity as tolerated  Essential hypertension-BP today***.  Well-controlled on. Continue current medical therapy Heart healthy low-sodium diet Increase physical activity as tolerated  Type 2 diabetes-glucose 237 on 01/09/2021 Continue metformin Heart healthy low-sodium carb modified diet Increase physical activity as tolerated Follows with PCP  Disposition: Follow-up with Dr. Martinique in 1-2 months.  Jossie Ng. Aleeha Boline NP-C    01/10/2021, 7:41 AM [address] [place] [address] Office [phone] Fax [phone]  Notice: This dictation was prepared with Dragon dictation along with smaller phrase technology. Any transcriptional errors that result from this process are unintentional and may not be corrected upon review.  I spent***minutes examining this patient, reviewing medications, and using patient centered shared decision making involving her cardiac care.  Prior to her visit I spent greater than 20 minutes reviewing her past medical history,  medications, and prior cardiac  tests.

## 2021-01-11 ENCOUNTER — Ambulatory Visit: Payer: Medicare Other | Admitting: General Practice

## 2021-01-14 ENCOUNTER — Encounter: Payer: Self-pay | Admitting: Thoracic Surgery (Cardiothoracic Vascular Surgery)

## 2021-01-14 ENCOUNTER — Other Ambulatory Visit: Payer: Self-pay | Admitting: *Deleted

## 2021-01-14 ENCOUNTER — Encounter: Payer: Self-pay | Admitting: *Deleted

## 2021-01-14 ENCOUNTER — Institutional Professional Consult (permissible substitution) (INDEPENDENT_AMBULATORY_CARE_PROVIDER_SITE_OTHER): Payer: Medicare Other | Admitting: Thoracic Surgery (Cardiothoracic Vascular Surgery)

## 2021-01-14 ENCOUNTER — Other Ambulatory Visit: Payer: Self-pay

## 2021-01-14 VITALS — BP 142/70 | HR 87 | Resp 20 | Ht 65.0 in | Wt 185.0 lb

## 2021-01-14 DIAGNOSIS — I2510 Atherosclerotic heart disease of native coronary artery without angina pectoris: Secondary | ICD-10-CM | POA: Diagnosis not present

## 2021-01-14 DIAGNOSIS — I129 Hypertensive chronic kidney disease with stage 1 through stage 4 chronic kidney disease, or unspecified chronic kidney disease: Secondary | ICD-10-CM | POA: Diagnosis not present

## 2021-01-14 DIAGNOSIS — E1151 Type 2 diabetes mellitus with diabetic peripheral angiopathy without gangrene: Secondary | ICD-10-CM | POA: Diagnosis not present

## 2021-01-14 DIAGNOSIS — E1142 Type 2 diabetes mellitus with diabetic polyneuropathy: Secondary | ICD-10-CM

## 2021-01-14 DIAGNOSIS — E0821 Diabetes mellitus due to underlying condition with diabetic nephropathy: Secondary | ICD-10-CM

## 2021-01-14 DIAGNOSIS — D649 Anemia, unspecified: Secondary | ICD-10-CM

## 2021-01-14 DIAGNOSIS — I10 Essential (primary) hypertension: Secondary | ICD-10-CM | POA: Diagnosis not present

## 2021-01-14 DIAGNOSIS — N1831 Chronic kidney disease, stage 3a: Secondary | ICD-10-CM | POA: Diagnosis not present

## 2021-01-14 DIAGNOSIS — R9430 Abnormal result of cardiovascular function study, unspecified: Secondary | ICD-10-CM

## 2021-01-14 DIAGNOSIS — J449 Chronic obstructive pulmonary disease, unspecified: Secondary | ICD-10-CM

## 2021-01-14 NOTE — H&P (View-Only) (Signed)
[address]       [place] [address]             [phone]        Linkin Stamp [facility] Medical Record #[id number] Date of Birth: 03-14-1951  Referring: Leonie Man, MD Primary Care: Leanna Battles, MD Primary Cardiologist:David Ellyn Hack, MD  Chief Complaint:    Chief Complaint  Patient presents with   Coronary Artery Disease    Initial surgical consult, cath & ECHO 9/13    History of Present Illness:     70 year old gentleman with a history of coronary artery disease underwent elective left heart cath which showed in-stent restenosis in his diagonal stent, and LAD and circumflex disease.  For the past several months he has had some exertional dyspnea and anginal symptoms.  He has also had some lethargy.   Past Medical and Surgical History: Previous Chest Surgery: No Previous Chest Radiation: No Diabetes Mellitus: Yes.  HbA1C 9.1 Creatinine: 1.10  Past Medical History:  Diagnosis Date   CAD S/P percutaneous coronary angioplasty -- D1, Xience Xpedition DES 2.5 mm x 33 mm 11/24/2011   a) Mild-to-moderate 30-40% lesions in the RCA, LAD and Circumflex. b) CULPRIT LESION: long tubular 70-80% lesion in D1 with FFR of 0.7 --> PCI w/ Xience Xpedition DES 2.5 mm x 30 mm (2.65 MM); c) Lexiscan Myoview 11/2013: No Ischemia or Infarct (Inferior Gut Attenuation) EF 63%.   COPD (chronic obstructive pulmonary disease) ([place])    "don't have full case of it; I'm right there at it"   Diabetes mellitus without complication ([place])    Essential hypertension 11/24/2011   Exertional dyspnea, chronic    Gout    History of Unstable angina 11/24/2011   Referred for cardiac catheterization   Hyperlipidemia with target LDL less than 70 11/24/2011   Obesity (BMI 30.0-34.9)    OSA (obstructive sleep apnea), uses oxygen at home did not tolerate cpap 11/24/2011    Past Surgical History:  Procedure Laterality Date   CERVICAL SPINE SURGERY  2012   CORONARY ANGIOPLASTY WITH STENT  PLACEMENT  11/23/2011   "1; first one"   LEFT HEART CATH AND CORONARY ANGIOGRAPHY N/A 12/28/2020   Procedure: LEFT HEART CATH AND CORONARY ANGIOGRAPHY;  Surgeon: Leonie Man, MD;  Location: [facility];  Service: Cardiovascular;  Laterality: N/A;   LEFT HEART CATHETERIZATION WITH CORONARY ANGIOGRAM N/A 11/23/2011   Procedure: LEFT HEART CATHETERIZATION WITH CORONARY ANGIOGRAM;  Surgeon: Leonie Man, MD;  Location: [facility];  Service: Cardiovascular;  Laterality: N/A;    Social History: Support: Lives alone, but presents today with a family friend  Social History   Tobacco Use  Smoking Status Former   Packs/day: 1.00   Years: 40.00   Pack years: 40.00   Types: Cigarettes   Quit date: 06/10/2016   Years since quitting: 4.6  Smokeless Tobacco Never  Tobacco Comments   02/18/14- smokes occ cig maybe 3 x per wk    Social History   Substance and Sexual Activity  Alcohol Use Yes   Alcohol/week: 0.0 standard drinks   Comment: social 2-3 drink a week     Allergies  Allergen Reactions   Codeine Itching   Duloxetine Hcl Other (See Comments)    Took at night and still felt very lethargic the following day.  (malaise)  PT DOES NOT REMEMBER TAKING MED      Current Outpatient Medications  Medication Sig Dispense Refill  acetaminophen (TYLENOL) 650 MG CR tablet Take 1,950 mg by mouth daily as needed for pain.      albuterol (PROVENTIL) (2.5 MG/3ML) 0.083% nebulizer solution take 3 mls (2.5mg ) total by neb q4h PRN for wheezing or SOB.     allopurinol (ZYLOPRIM) 100 MG tablet Take 100 mg by mouth daily.     amLODipine (NORVASC) 5 MG tablet Take 5 mg by mouth daily.     benzonatate (TESSALON) 100 MG capsule Take by mouth 3 (three) times daily as needed for cough.     clopidogrel (PLAVIX) 75 MG tablet Take 1 tablet (75 mg total) by mouth daily. 30 tablet 1   diclofenac Sodium (VOLTAREN) 1 % GEL Apply 2 g topically 2 (two) times daily as needed (shoulder pain).      Ergocalciferol (VITAMIN D2 PO) Take 5,000 Units by mouth daily.     furosemide (LASIX) 20 MG tablet Take 20 mg by mouth daily.     furosemide (LASIX) 20 MG tablet Take 1 tablet by mouth daily.     gabapentin (NEURONTIN) 300 MG capsule Take 600 mg by mouth 2 (two) times daily.     HYDROcodone-acetaminophen (NORCO/VICODIN) 5-325 MG tablet Take 1 tablet by mouth every 6 (six) hours as needed for severe pain. 3 tablet 0   Insulin Glargine (BASAGLAR KWIKPEN) 100 UNIT/ML Inject 40 Units into the skin daily.     insulin lispro (HUMALOG) 100 UNIT/ML KwikPen Inject 6-10 Units into the skin See admin instructions. 10 units in the morning, 6 units with lunch, and 6 units with supper     loratadine (CLARITIN) 10 MG tablet Take 1 tablet (10 mg total) by mouth daily. 30 tablet 0   metFORMIN (GLUCOPHAGE-XR) 500 MG 24 hr tablet Take 1,000 mg by mouth in the morning and at bedtime.     metoprolol tartrate (LOPRESSOR) 25 MG tablet Take 1 tablet (25 mg total) by mouth 2 (two) times daily. 60 tablet 0   omeprazole (PRILOSEC) 40 MG capsule Take 40 mg by mouth daily.     rosuvastatin (CRESTOR) 20 MG tablet Take 1 tablet (20 mg total) by mouth daily. 90 tablet 3   Tetrahydrozoline HCl (VISINE EXTRA OP) Place 1 drop into both eyes as needed (itching).     traMADol (ULTRAM) 50 MG tablet Take 50 mg by mouth every 6 (six) hours as needed.     traMADol (ULTRAM) 50 MG tablet 1 tablet as needed - per orthopedics     umeclidinium-vilanterol (ANORO ELLIPTA) 62.5-25 MCG/INH AEPB Inhale 1 puff into the lungs daily. 1 each 12   Vitamin D, Ergocalciferol, (DRISDOL) 1.25 MG (50000 UNIT) CAPS capsule Take 50,000 Units by mouth once a week.     nitroGLYCERIN (NITROSTAT) 0.4 MG SL tablet Place 1 tablet (0.4 mg total) under the tongue every 5 (five) minutes as needed for chest pain. 25 tablet 4   Current Facility-Administered Medications  Medication Dose Route Frequency Provider Last Rate Last Admin   sodium chloride flush (NS) 0.9 %  injection 3 mL  3 mL Intravenous Q12H Martinique, [name], MD        (Not in a hospital admission)   Family History  Problem Relation Age of Onset   Heart disease Sister    Heart attack Brother    Emphysema Father        smoked   Aneurysm Father      Review of Systems:   Review of Systems  Constitutional:  Positive for malaise/fatigue. Negative for weight  loss.  Respiratory:  Positive for shortness of breath.   Cardiovascular:  Positive for chest pain. Negative for leg swelling.     Physical Exam: BP (!) 142/70 (BP Location: Right Arm, Patient Position: Sitting)   Pulse 87   Resp 20   Ht 5\' 5"  (1.651 m)   Wt 185 lb (83.9 kg)   SpO2 94% Comment: RA  BMI 30.79 kg/m  Physical Exam Constitutional:      General: He is not in acute distress.    Appearance: Normal appearance. He is not ill-appearing.  HENT:     Head: Normocephalic.  Cardiovascular:     Rate and Rhythm: Normal rate.     Heart sounds: No murmur heard. Pulmonary:     Effort: Pulmonary effort is normal. No respiratory distress.     Breath sounds: Normal breath sounds.  Musculoskeletal:     Cervical back: Normal range of motion.     Comments: Abnormal gait.  Skin:    General: Skin is warm and dry.  Neurological:     General: No focal deficit present.     Mental Status: He is alert and oriented to person, place, and time.      Diagnostic Studies & Laboratory data:    Left Heart Catherization: Left heart cath was reviewed.  The in-stent restenosis in the diagonal branch does not show good distal target.  The LAD and obtuse marginal are good targets. Echo: Preserved biventricular function.  No significant valvular disease EKG: Sinus I have independently reviewed the above radiologic studies and discussed with the patient   Recent Lab Findings: Lab Results  Component Value Date   WBC 10.0 01/09/2021   HGB 13.2 01/09/2021   HCT 40.3 01/09/2021   PLT 158 01/09/2021   GLUCOSE 237 (H) 01/09/2021    CHOL 119 12/21/2020   TRIG 201 (H) 12/21/2020   HDL 24 (L) 12/21/2020   LDLCALC 61 12/21/2020   ALT 30 01/09/2021   AST 56 (H) 01/09/2021   NA 133 (L) 01/09/2021   K 4.6 01/09/2021   CL 102 01/09/2021   CREATININE 1.10 01/09/2021   BUN 15 01/09/2021   CO2 19 (L) 01/09/2021   TSH 2.852 07/14/2020   INR 1.1 12/21/2020   HGBA1C 9.1 (H) 12/21/2020      Assessment / Plan:   This is a 70 year old gentleman with a history of coronary artery disease who presents with significant stenosis in his LAD and circumflex vessels.  He has mild disease in his right coronary artery.  His echocardiogram does not show any significant valvular disease and preserved biventricular function.  Will review of the imaging I do not think that he has a good target in his diagonal branch that is already stented, but his LAD and obtuse marginal will be good targets.  His diabetes is poorly controlled with a hemoglobin A1c of 9.1 at the beginning of this month.  We discussed the risks and benefits of surgical revascularization and he is agreeable to proceed.  He does live by himself, but states that he has good family and friend support.  He is tentatively scheduled for January 25, 2021.  We will need to hold his Plavix prior to surgery for 5 days.     I  spent 40 minutes counseling the patient face to face.   Lajuana Matte 01/14/2021 10:50 AM

## 2021-01-14 NOTE — Progress Notes (Signed)
[address]       [place] [address]             [phone]        Paul [name] Medical Record #[id number] Date of Birth: [birth year]  Referring: Leonie Man, MD Primary Care: Leanna Battles, MD Primary Cardiologist:David Ellyn Hack, MD  Chief Complaint:    Chief Complaint  Patient presents with   Coronary Artery Disease    Initial surgical consult, cath & ECHO 9/13    History of Present Illness:     70 year old gentleman with a history of coronary artery disease underwent elective left heart cath which showed in-stent restenosis in his diagonal stent, and LAD and circumflex disease.  For the past several months he has had some exertional dyspnea and anginal symptoms.  He has also had some lethargy.   Past Medical and Surgical History: Previous Chest Surgery: No Previous Chest Radiation: No Diabetes Mellitus: Yes.  HbA1C 9.1 Creatinine: 1.10  Past Medical History:  Diagnosis Date   CAD S/P percutaneous coronary angioplasty -- D1, Xience Xpedition DES 2.5 mm x 33 mm 11/24/2011   a) Mild-to-moderate 30-40% lesions in the RCA, LAD and Circumflex. b) CULPRIT LESION: long tubular 70-80% lesion in D1 with FFR of 0.7 --> PCI w/ Xience Xpedition DES 2.5 mm x 30 mm (2.65 MM); c) Lexiscan Myoview 11/2013: No Ischemia or Infarct (Inferior Gut Attenuation) EF 63%.   COPD (chronic obstructive pulmonary disease) ([place])    "don't have full case of it; I'm right there at it"   Diabetes mellitus without complication ([place])    Essential hypertension 11/24/2011   Exertional dyspnea, chronic    Gout    History of Unstable angina 11/24/2011   Referred for cardiac catheterization   Hyperlipidemia with target LDL less than 70 11/24/2011   Obesity (BMI 30.0-34.9)    OSA (obstructive sleep apnea), uses oxygen at home did not tolerate cpap 11/24/2011    Past Surgical History:  Procedure Laterality Date   CERVICAL SPINE SURGERY  2012   CORONARY ANGIOPLASTY WITH STENT  PLACEMENT  11/23/2011   "1; first one"   LEFT HEART CATH AND CORONARY ANGIOGRAPHY N/A 12/28/2020   Procedure: LEFT HEART CATH AND CORONARY ANGIOGRAPHY;  Surgeon: Leonie Man, MD;  Location: [facility];  Service: Cardiovascular;  Laterality: N/A;   LEFT HEART CATHETERIZATION WITH CORONARY ANGIOGRAM N/A 11/23/2011   Procedure: LEFT HEART CATHETERIZATION WITH CORONARY ANGIOGRAM;  Surgeon: Leonie Man, MD;  Location: [facility] CATH [facility];  Service: Cardiovascular;  Laterality: N/A;    Social History: Support: Lives alone, but presents today with a family friend  Social History   Tobacco Use  Smoking Status Former   Packs/day: 1.00   Years: 40.00   Pack years: 40.00   Types: Cigarettes   Quit date: 06/10/2016   Years since quitting: 4.6  Smokeless Tobacco Never  Tobacco Comments   02/18/14- smokes occ cig maybe 3 x per wk    Social History   Substance and Sexual Activity  Alcohol Use Yes   Alcohol/week: 0.0 standard drinks   Comment: social 2-3 drink a week     Allergies  Allergen Reactions   Codeine Itching   Duloxetine Hcl Other (See Comments)    Took at night and still felt very lethargic the following day.  (malaise)  PT DOES NOT REMEMBER TAKING MED      Current Outpatient Medications  Medication Sig Dispense Refill  acetaminophen (TYLENOL) 650 MG CR tablet Take 1,950 mg by mouth daily as needed for pain.      albuterol (PROVENTIL) (2.5 MG/3ML) 0.083% nebulizer solution take 3 mls (2.5mg ) total by neb q4h PRN for wheezing or SOB.     allopurinol (ZYLOPRIM) 100 MG tablet Take 100 mg by mouth daily.     amLODipine (NORVASC) 5 MG tablet Take 5 mg by mouth daily.     benzonatate (TESSALON) 100 MG capsule Take by mouth 3 (three) times daily as needed for cough.     clopidogrel (PLAVIX) 75 MG tablet Take 1 tablet (75 mg total) by mouth daily. 30 tablet 1   diclofenac Sodium (VOLTAREN) 1 % GEL Apply 2 g topically 2 (two) times daily as needed (shoulder pain).      Ergocalciferol (VITAMIN D2 PO) Take 5,000 Units by mouth daily.     furosemide (LASIX) 20 MG tablet Take 20 mg by mouth daily.     furosemide (LASIX) 20 MG tablet Take 1 tablet by mouth daily.     gabapentin (NEURONTIN) 300 MG capsule Take 600 mg by mouth 2 (two) times daily.     HYDROcodone-acetaminophen (NORCO/VICODIN) 5-325 MG tablet Take 1 tablet by mouth every 6 (six) hours as needed for severe pain. 3 tablet 0   Insulin Glargine (BASAGLAR KWIKPEN) 100 UNIT/ML Inject 40 Units into the skin daily.     insulin lispro (HUMALOG) 100 UNIT/ML KwikPen Inject 6-10 Units into the skin See admin instructions. 10 units in the morning, 6 units with lunch, and 6 units with supper     loratadine (CLARITIN) 10 MG tablet Take 1 tablet (10 mg total) by mouth daily. 30 tablet 0   metFORMIN (GLUCOPHAGE-XR) 500 MG 24 hr tablet Take 1,000 mg by mouth in the morning and at bedtime.     metoprolol tartrate (LOPRESSOR) 25 MG tablet Take 1 tablet (25 mg total) by mouth 2 (two) times daily. 60 tablet 0   omeprazole (PRILOSEC) 40 MG capsule Take 40 mg by mouth daily.     rosuvastatin (CRESTOR) 20 MG tablet Take 1 tablet (20 mg total) by mouth daily. 90 tablet 3   Tetrahydrozoline HCl (VISINE EXTRA OP) Place 1 drop into both eyes as needed (itching).     traMADol (ULTRAM) 50 MG tablet Take 50 mg by mouth every 6 (six) hours as needed.     traMADol (ULTRAM) 50 MG tablet 1 tablet as needed - per orthopedics     umeclidinium-vilanterol (ANORO ELLIPTA) 62.5-25 MCG/INH AEPB Inhale 1 puff into the lungs daily. 1 each 12   Vitamin D, Ergocalciferol, (DRISDOL) 1.25 MG (50000 UNIT) CAPS capsule Take 50,000 Units by mouth once a week.     nitroGLYCERIN (NITROSTAT) 0.4 MG SL tablet Place 1 tablet (0.4 mg total) under the tongue every 5 (five) minutes as needed for chest pain. 25 tablet 4   Current Facility-Administered Medications  Medication Dose Route Frequency Provider Last Rate Last Admin   sodium chloride flush (NS) 0.9 %  injection 3 mL  3 mL Intravenous Q12H Martinique, [name], MD        (Not in a hospital admission)   Family History  Problem Relation Age of Onset   Heart disease Sister    Heart attack Brother    Emphysema Father        smoked   Aneurysm Father      Review of Systems:   Review of Systems  Constitutional:  Positive for malaise/fatigue. Negative for weight  loss.  Respiratory:  Positive for shortness of breath.   Cardiovascular:  Positive for chest pain. Negative for leg swelling.     Physical Exam: BP (!) 142/70 (BP Location: Right Arm, Patient Position: Sitting)   Pulse 87   Resp 20   Ht 5\' 5"  (1.651 m)   Wt 185 lb (83.9 kg)   SpO2 94% Comment: RA  BMI 30.79 kg/m  Physical Exam Constitutional:      General: He is not in acute distress.    Appearance: Normal appearance. He is not ill-appearing.  HENT:     Head: Normocephalic.  Cardiovascular:     Rate and Rhythm: Normal rate.     Heart sounds: No murmur heard. Pulmonary:     Effort: Pulmonary effort is normal. No respiratory distress.     Breath sounds: Normal breath sounds.  Musculoskeletal:     Cervical back: Normal range of motion.     Comments: Abnormal gait.  Skin:    General: Skin is warm and dry.  Neurological:     General: No focal deficit present.     Mental Status: He is alert and oriented to person, place, and time.      Diagnostic Studies & Laboratory data:    Left Heart Catherization: Left heart cath was reviewed.  The in-stent restenosis in the diagonal branch does not show good distal target.  The LAD and obtuse marginal are good targets. Echo: Preserved biventricular function.  No significant valvular disease EKG: Sinus I have independently reviewed the above radiologic studies and discussed with the patient   Recent Lab Findings: Lab Results  Component Value Date   WBC 10.0 01/09/2021   HGB 13.2 01/09/2021   HCT 40.3 01/09/2021   PLT 158 01/09/2021   GLUCOSE 237 (H) 01/09/2021    CHOL 119 12/21/2020   TRIG 201 (H) 12/21/2020   HDL 24 (L) 12/21/2020   LDLCALC 61 12/21/2020   ALT 30 01/09/2021   AST 56 (H) 01/09/2021   NA 133 (L) 01/09/2021   K 4.6 01/09/2021   CL 102 01/09/2021   CREATININE 1.10 01/09/2021   BUN 15 01/09/2021   CO2 19 (L) 01/09/2021   TSH 2.852 07/14/2020   INR 1.1 12/21/2020   HGBA1C 9.1 (H) 12/21/2020      Assessment / Plan:   This is a 70 year old gentleman with a history of coronary artery disease who presents with significant stenosis in his LAD and circumflex vessels.  He has mild disease in his right coronary artery.  His echocardiogram does not show any significant valvular disease and preserved biventricular function.  Will review of the imaging I do not think that he has a good target in his diagonal branch that is already stented, but his LAD and obtuse marginal will be good targets.  His diabetes is poorly controlled with a hemoglobin A1c of 9.1 at the beginning of this month.  We discussed the risks and benefits of surgical revascularization and he is agreeable to proceed.  He does live by himself, but states that he has good family and friend support.  He is tentatively scheduled for January 25, 2021.  We will need to hold his Plavix prior to surgery for 5 days.     I  spent 40 minutes counseling the patient face to face.   Lajuana Matte 01/14/2021 10:50 AM

## 2021-01-20 NOTE — Pre-Procedure Instructions (Signed)
Surgical Instructions    Your procedure is scheduled on Tuesday, October 11th, 2022.  Report to [facility] Main Entrance "A" at 05:30 A.M., then check in with the Admitting office.  Call this number if you have problems the morning of surgery:  [phone]   If you have any questions prior to your surgery date call [phone]: Open Monday-Friday 8am-4pm    Remember:  Do not eat or drink after midnight the night before your surgery    Take these medicines the morning of surgery with A SIP OF WATER: allopurinol (ZYLOPRIM) amLODipine (NORVASC)  gabapentin (NEURONTIN)  loratadine (CLARITIN) metoprolol tartrate (LOPRESSOR) omeprazole (PRILOSEC) rosuvastatin (CRESTOR) umeclidinium-vilanterol (ANORO ELLIPTA)- please bring with you to hospital   Take these medicines the morning of surgery with A SIP OF WATER AS NEEDED: acetaminophen (TYLENOL)  albuterol (PROVENTIL) (2.5 MG/3ML) 0.083% nebulizer solution benzonatate (TESSALON)  HYDROcodone-acetaminophen (NORCO/VICODIN) nitroGLYCERIN (NITROSTAT)  Tetrahydrozoline HCl (VISINE EXTRA OP) traMADol (ULTRAM)  Follow your surgeon's instructions on when to stop clopidogrel (PLAVIX).  If no instructions were given by your surgeon then you will need to call the office to get those instructions.     As of today, STOP taking any diclofenac Sodium (VOLTAREN), Aspirin (unless otherwise instructed by your surgeon) Aleve, Naproxen, Ibuprofen, Motrin, Advil, Goody's, BC's, all herbal medications, fish oil, and all vitamins.   WHAT DO I DO ABOUT MY DIABETES MEDICATION?   Do not take metFORMIN (GLUCOPHAGE-XR) the morning of surgery.  Insulin Glargine [facility]) Monday 01/24/21 Morning: Take usual dose Dinner/bedtime dose: Take 50% of dose (20 units) Tuesday 01/25/21 Morning: Take 50% of dose (20 units)   insulin lispro (HUMALOG)  Monday 01/24/21 Morning: Take usual dose Dinner/bedtime dose: Do NOT take  Tuesday 01/25/21 Morning:  If your CBG is greater than 220 mg/dL, you may take  of your sliding scale (correction) dose of insulin.  The day of surgery, do not take other diabetes injectables, including Byetta (exenatide), Bydureon (exenatide ER), Victoza (liraglutide), or Trulicity (dulaglutide).   HOW TO MANAGE YOUR DIABETES BEFORE AND AFTER SURGERY  Why is it important to control my blood sugar before and after surgery? Improving blood sugar levels before and after surgery helps healing and can limit problems. A way of improving blood sugar control is eating a healthy diet by:  Eating less sugar and carbohydrates  Increasing activity/exercise  Talking with your doctor about reaching your blood sugar goals High blood sugars (greater than 180 mg/dL) can raise your risk of infections and slow your recovery, so you will need to focus on controlling your diabetes during the weeks before surgery. Make sure that the doctor who takes care of your diabetes knows about your planned surgery including the date and location.  How do I manage my blood sugar before surgery? Check your blood sugar at least 4 times a day, starting 2 days before surgery, to make sure that the level is not too high or low.  Check your blood sugar the morning of your surgery when you wake up and every 2 hours until you get to the Short Stay unit.  If your blood sugar is less than 70 mg/dL, you will need to treat for low blood sugar: Do not take insulin. Treat a low blood sugar (less than 70 mg/dL) with  cup of clear juice (cranberry or apple), 4 glucose tablets, OR glucose gel. Recheck blood sugar in 15 minutes after treatment (to make sure it is greater than 70 mg/dL). If your blood sugar is not  greater than 70 mg/dL on recheck, call [phone] for further instructions. Report your blood sugar to the short stay nurse when you get to Short Stay.  If you are admitted to the hospital after surgery: Your blood sugar will be checked by the staff  and you will probably be given insulin after surgery (instead of oral diabetes medicines) to make sure you have good blood sugar levels. The goal for blood sugar control after surgery is 80-180 mg/dL.     After your COVID test   You are not required to quarantine however you are required to wear a well-fitting mask when you are out and around people not in your household.  If your mask becomes wet or soiled, replace with a new one.  Wash your hands often with soap and water for 20 seconds or clean your hands with an alcohol-based hand sanitizer that contains at least 60% alcohol.  Do not share personal items.  Notify your provider: if you are in close contact with someone who has COVID  or if you develop a fever of 100.4 or greater, sneezing, cough, sore throat, shortness of breath or body aches.             Do not wear jewelry Do not wear lotions, powders, colognes, or deodorant. Men may shave face and neck. Do not bring valuables to the hospital.            [facility] is not responsible for any belongings or valuables.  Do NOT Smoke (Tobacco/Vaping)  24 hours prior to your procedure  If you use a CPAP at night, you may bring your mask for your overnight stay.   Contacts, glasses, hearing aids, dentures or partials may not be worn into surgery, please bring cases for these belongings   For patients admitted to the hospital, discharge time will be determined by your treatment team.   Patients discharged the day of surgery will not be allowed to drive home, and someone needs to stay with them for 24 hours.  NO VISITORS WILL BE ALLOWED IN PRE-OP WHERE PATIENTS ARE PREPPED FOR SURGERY.  ONLY 1 SUPPORT PERSON MAY BE PRESENT IN THE WAITING ROOM WHILE YOU ARE IN SURGERY.  IF YOU ARE TO BE ADMITTED, ONCE YOU ARE IN YOUR ROOM YOU WILL BE ALLOWED TWO (2) VISITORS. 1 (ONE) VISITOR MAY STAY OVERNIGHT BUT MUST ARRIVE TO THE ROOM BY 8pm.  Minor children may have two parents present. Special  consideration for safety and communication needs will be reviewed on a case by case basis.  Special instructions:    Oral Hygiene is also important to reduce your risk of infection.  Remember - BRUSH YOUR TEETH THE MORNING OF SURGERY WITH YOUR REGULAR TOOTHPASTE   Paul [name]- Preparing For Surgery  Before surgery, you can play an important role. Because skin is not sterile, your skin needs to be as free of germs as possible. You can reduce the number of germs on your skin by washing with CHG (chlorahexidine gluconate) Soap before surgery.  CHG is an antiseptic cleaner which kills germs and bonds with the skin to continue killing germs even after washing.     Please do not use if you have an allergy to CHG or antibacterial soaps. If your skin becomes reddened/irritated stop using the CHG.  Do not shave (including legs and underarms) for at least 48 hours prior to first CHG shower. It is OK to shave your face.  Please follow these instructions carefully.  Shower the NIGHT BEFORE SURGERY and the MORNING OF SURGERY with CHG Soap.   If you chose to wash your hair, wash your hair first as usual with your normal shampoo. After you shampoo, rinse your hair and body thoroughly to remove the shampoo.  Then [employer] and genitals (private parts) with your normal soap and rinse thoroughly to remove soap.  After that Use CHG Soap as you would any other liquid soap. You can apply CHG directly to the skin and wash gently with a scrungie or a clean washcloth.   Apply the CHG Soap to your body ONLY FROM THE NECK DOWN.  Do not use on open wounds or open sores. Avoid contact with your eyes, ears, mouth and genitals (private parts). Wash Face and genitals (private parts)  with your normal soap.   Wash thoroughly, paying special attention to the area where your surgery will be performed.  Thoroughly rinse your body with warm water from the neck down.  DO NOT shower/wash with your normal soap after using  and rinsing off the CHG Soap.  Pat yourself dry with a CLEAN TOWEL.  Wear CLEAN PAJAMAS to bed the night before surgery  Place CLEAN SHEETS on your bed the night before your surgery  DO NOT SLEEP WITH PETS.   Day of Surgery:  Take a shower with CHG soap. Wear Clean/Comfortable clothing the morning of surgery Do not apply any deodorants/lotions.   Remember to brush your teeth WITH YOUR REGULAR TOOTHPASTE.   Please read over the following fact sheets that you were given.

## 2021-01-21 ENCOUNTER — Ambulatory Visit (HOSPITAL_COMMUNITY)
Admission: RE | Admit: 2021-01-21 | Discharge: 2021-01-21 | Disposition: A | Discharge status: Home / Self Care | Payer: Medicare Other | Source: Ambulatory Visit | Attending: Thoracic Surgery (Cardiothoracic Vascular Surgery) | Admitting: Thoracic Surgery (Cardiothoracic Vascular Surgery)

## 2021-01-21 ENCOUNTER — Other Ambulatory Visit: Payer: Self-pay

## 2021-01-21 ENCOUNTER — Encounter (HOSPITAL_COMMUNITY): Payer: Self-pay

## 2021-01-21 ENCOUNTER — Encounter (HOSPITAL_COMMUNITY)
Admission: RE | Admit: 2021-01-21 | Discharge: 2021-01-21 | Disposition: A | Discharge status: Home / Self Care | Payer: Medicare Other | Source: Ambulatory Visit | Attending: Thoracic Surgery (Cardiothoracic Vascular Surgery) | Admitting: Thoracic Surgery (Cardiothoracic Vascular Surgery)

## 2021-01-21 DIAGNOSIS — E1142 Type 2 diabetes mellitus with diabetic polyneuropathy: Secondary | ICD-10-CM | POA: Diagnosis not present

## 2021-01-21 DIAGNOSIS — I2510 Atherosclerotic heart disease of native coronary artery without angina pectoris: Secondary | ICD-10-CM

## 2021-01-21 DIAGNOSIS — Z20822 Contact with and (suspected) exposure to covid-19: Secondary | ICD-10-CM | POA: Insufficient documentation

## 2021-01-21 DIAGNOSIS — Z01818 Encounter for other preprocedural examination: Secondary | ICD-10-CM | POA: Diagnosis not present

## 2021-01-21 LAB — COMPREHENSIVE METABOLIC PANEL
ALT: 26 U/L (ref 0–44)
AST: 40 U/L (ref 15–41)
Albumin: 3.8 g/dL (ref 3.5–5.0)
Alkaline Phosphatase: 108 U/L (ref 38–126)
Anion gap: 10 (ref 5–15)
BUN: 12 mg/dL (ref 8–23)
CO2: 19 mmol/L — ABNORMAL LOW (ref 22–32)
Calcium: 9.4 mg/dL (ref 8.9–10.3)
Chloride: 107 mmol/L (ref 98–111)
Creatinine, Ser: 0.84 mg/dL (ref 0.61–1.24)
GFR, Estimated: >60 mL/min (ref >60)
Glucose, Bld: 201 mg/dL — ABNORMAL HIGH (ref 70–99)
Potassium: 3.9 mmol/L (ref 3.5–5.1)
Sodium: 136 mmol/L (ref 135–145)
Total Bilirubin: 0.8 mg/dL (ref 0.3–1.2)
Total Protein: 7.6 g/dL (ref 6.5–8.1)

## 2021-01-21 LAB — URINALYSIS, ROUTINE W REFLEX MICROSCOPIC
Bacteria, UA: NONE SEEN
Bilirubin Urine: NEGATIVE
Glucose, UA: NEGATIVE mg/dL
Hgb urine dipstick: NEGATIVE
Ketones, ur: NEGATIVE mg/dL
Nitrite: NEGATIVE
Protein, ur: NEGATIVE mg/dL
Specific Gravity, Urine: 1.018 (ref 1.005–1.030)
pH: 5 (ref 5.0–8.0)

## 2021-01-21 LAB — CBC
HCT: 41.2 % (ref 39.0–52.0)
Hemoglobin: 12.7 g/dL — ABNORMAL LOW (ref 13.0–17.0)
MCH: 28.7 pg (ref 26.0–34.0)
MCHC: 30.8 g/dL (ref 30.0–36.0)
MCV: 93.2 fL (ref 80.0–100.0)
Platelets: 170 10*3/uL (ref 150–400)
RBC: 4.42 MIL/uL (ref 4.22–5.81)
RDW: 13.7 % (ref 11.5–15.5)
WBC: 7.6 10*3/uL (ref 4.0–10.5)
nRBC: 0 % (ref 0.0–0.2)

## 2021-01-21 LAB — PROTIME-INR
INR: 1.1 (ref 0.8–1.2)
Prothrombin Time: 14.5 seconds (ref 11.4–15.2)

## 2021-01-21 LAB — HEMOGLOBIN A1C
Hgb A1c MFr Bld: 8.8 % — ABNORMAL HIGH (ref 4.8–5.6)
Mean Plasma Glucose: 205.86 mg/dL

## 2021-01-21 LAB — BLOOD GAS, ARTERIAL
Acid-base deficit: 2.2 mmol/L — ABNORMAL HIGH (ref 0.0–2.0)
Bicarbonate: 21.5 mmol/L (ref 20.0–28.0)
Drawn by: 602861
FIO2: 21
O2 Saturation: 94.7 %
Patient temperature: 37
pCO2 arterial: 33.7 mmHg (ref 32.0–48.0)
pH, Arterial: 7.422 (ref 7.350–7.450)
pO2, Arterial: 86.5 mmHg (ref 83.0–108.0)

## 2021-01-21 LAB — TYPE AND SCREEN
ABO/RH(D): O POS
Antibody Screen: NEGATIVE

## 2021-01-21 LAB — APTT: aPTT: 31 seconds (ref 24–36)

## 2021-01-21 LAB — SURGICAL PCR SCREEN
MRSA, PCR: NEGATIVE
Staphylococcus aureus: POSITIVE — AB

## 2021-01-21 LAB — SARS CORONAVIRUS 2 (TAT 6-24 HRS): SARS Coronavirus 2: NEGATIVE

## 2021-01-21 LAB — GLUCOSE, CAPILLARY: Glucose-Capillary: 186 mg/dL — ABNORMAL HIGH (ref 70–99)

## 2021-01-21 NOTE — Progress Notes (Signed)
Pre-CABG testing has been completed. Preliminary results can be found in CV Proc through chart review.   01/21/21 8:54 AM Paul [name] RVT

## 2021-01-21 NOTE — Progress Notes (Signed)
PCP - Leanna Battles MD Cardiologist - Leanna Battles MD  PPM/ICD - denies Device Orders -  Rep Notified -   Chest x-ray - 01/21/21 EKG - 01/09/21 Stress Test - 12/04/13 ECHO - 12/28/20 Cardiac Cath - 12/28/20  Sleep Study - 03/21/16 CPAP - pt states he has CPAP but does not use it because he cannot tolerate it.   Fasting Blood Sugar -  Checks Blood Sugar _____ times a day  Blood Thinner Instructions:Pt stated that his last dose of Plavix was yesterday 10/6 .  Aspirin Instructions:n/a  ERAS Protcol -no  PRE-SURGERY Ensure or G2-   COVID TEST- yes,01/21/21   Anesthesia review: yes  Patient denies shortness of breath, fever, cough and chest pain at PAT appointment   All instructions explained to the patient, with a verbal understanding of the material. Patient agrees to go over the instructions while at home for a better understanding. Patient also instructed to self quarantine after being tested for COVID-19. The opportunity to ask questions was provided.

## 2021-01-21 NOTE — Pre-Procedure Instructions (Signed)
Surgical Instructions    Your procedure is scheduled on Tuesday, October 11th, 2022.  Report to [facility] Main Entrance "A" at 05:30 A.M., then check in with the Admitting office.  Call this number if you have problems the morning of surgery:  [phone]   If you have any questions prior to your surgery date call [phone]: Open Monday-Friday 8am-4pm    Remember:  Do not eat or drink after midnight the night before your surgery    Take these medicines the morning of surgery with A SIP OF WATER: allopurinol (ZYLOPRIM) amLODipine (NORVASC)  gabapentin (NEURONTIN)  loratadine (CLARITIN) metoprolol tartrate (LOPRESSOR) omeprazole (PRILOSEC) rosuvastatin (CRESTOR) umeclidinium-vilanterol (ANORO ELLIPTA)- please bring with you to hospital   Take these medicines the morning of surgery with A SIP OF WATER AS NEEDED: acetaminophen (TYLENOL)  albuterol (PROVENTIL) (2.5 MG/3ML) 0.083% nebulizer solution benzonatate (TESSALON)  HYDROcodone-acetaminophen (NORCO/VICODIN) nitroGLYCERIN (NITROSTAT)  Tetrahydrozoline HCl (VISINE EXTRA OP) traMADol (ULTRAM)  Follow your surgeon's instructions on when to stop clopidogrel (PLAVIX).  If no instructions were given by your surgeon then you will need to call the office to get those instructions.     As of today, STOP taking any diclofenac Sodium (VOLTAREN), Aspirin (unless otherwise instructed by your surgeon) Aleve, Naproxen, Ibuprofen, Motrin, Advil, Goody's, BC's, all herbal medications, fish oil, and all vitamins.   WHAT DO I DO ABOUT MY DIABETES MEDICATION?   Do not take metFORMIN (GLUCOPHAGE-XR) the morning of surgery.  Insulin Glargine [facility]) Monday 01/24/21 Morning: Take usual dose Tuesday 01/25/21 Morning: Take 50% of dose (15 units)   insulin lispro (HUMALOG)  Monday 01/24/21 Before meals take usual dose  Tuesday 01/25/21 day of surgery Do not take unless your CBG is greater than 220 mg/dL, you may take   (7units) of your sliding scale (correction) dose of insulin.  The day of surgery, do not take other diabetes injectables, including Byetta (exenatide), Bydureon (exenatide ER), Victoza (liraglutide), or Trulicity (dulaglutide).   HOW TO MANAGE YOUR DIABETES BEFORE AND AFTER SURGERY  Why is it important to control my blood sugar before and after surgery? Improving blood sugar levels before and after surgery helps healing and can limit problems. A way of improving blood sugar control is eating a healthy diet by:  Eating less sugar and carbohydrates  Increasing activity/exercise  Talking with your doctor about reaching your blood sugar goals High blood sugars (greater than 180 mg/dL) can raise your risk of infections and slow your recovery, so you will need to focus on controlling your diabetes during the weeks before surgery. Make sure that the doctor who takes care of your diabetes knows about your planned surgery including the date and location.  How do I manage my blood sugar before surgery? Check your blood sugar at least 4 times a day, starting 2 days before surgery, to make sure that the level is not too high or low.  Check your blood sugar the morning of your surgery when you wake up and every 2 hours until you get to the Short Stay unit.  If your blood sugar is less than 70 mg/dL, you will need to treat for low blood sugar: Do not take insulin. Treat a low blood sugar (less than 70 mg/dL) with  cup of clear juice (cranberry or apple), 4 glucose tablets, OR glucose gel. Recheck blood sugar in 15 minutes after treatment (to make sure it is greater than 70 mg/dL). If your blood sugar is not greater than 70 mg/dL on recheck,  call [phone] for further instructions. Report your blood sugar to the short stay nurse when you get to Short Stay.  If you are admitted to the hospital after surgery: Your blood sugar will be checked by the staff and you will probably be given insulin after  surgery (instead of oral diabetes medicines) to make sure you have good blood sugar levels. The goal for blood sugar control after surgery is 80-180 mg/dL.     After your COVID test   You are not required to quarantine however you are required to wear a well-fitting mask when you are out and around people not in your household.  If your mask becomes wet or soiled, replace with a new one.  Wash your hands often with soap and water for 20 seconds or clean your hands with an alcohol-based hand sanitizer that contains at least 60% alcohol.  Do not share personal items.  Notify your provider: if you are in close contact with someone who has COVID  or if you develop a fever of 100.4 or greater, sneezing, cough, sore throat, shortness of breath or body aches.             Do not wear jewelry Do not wear lotions, powders, colognes, or deodorant. Men may shave face and neck. Do not bring valuables to the hospital.            [facility] is not responsible for any belongings or valuables.  Do NOT Smoke (Tobacco/Vaping)  24 hours prior to your procedure  If you use a CPAP at night, you may bring your mask for your overnight stay.   Contacts, glasses, hearing aids, dentures or partials may not be worn into surgery, please bring cases for these belongings   For patients admitted to the hospital, discharge time will be determined by your treatment team.   Patients discharged the day of surgery will not be allowed to drive home, and someone needs to stay with them for 24 hours.  NO VISITORS WILL BE ALLOWED IN PRE-OP WHERE PATIENTS ARE PREPPED FOR SURGERY.  ONLY 1 SUPPORT PERSON MAY BE PRESENT IN THE WAITING ROOM WHILE YOU ARE IN SURGERY.  IF YOU ARE TO BE ADMITTED, ONCE YOU ARE IN YOUR ROOM YOU WILL BE ALLOWED TWO (2) VISITORS. 1 (ONE) VISITOR MAY STAY OVERNIGHT BUT MUST ARRIVE TO THE ROOM BY 8pm.  Minor children may have two parents present. Special consideration for safety and communication  needs will be reviewed on a case by case basis.  Special instructions:    Oral Hygiene is also important to reduce your risk of infection.  Remember - BRUSH YOUR TEETH THE MORNING OF SURGERY WITH YOUR REGULAR TOOTHPASTE   [facility]- Preparing For Surgery  Before surgery, you can play an important role. Because skin is not sterile, your skin needs to be as free of germs as possible. You can reduce the number of germs on your skin by washing with CHG (chlorahexidine gluconate) Soap before surgery.  CHG is an antiseptic cleaner which kills germs and bonds with the skin to continue killing germs even after washing.     Please do not use if you have an allergy to CHG or antibacterial soaps. If your skin becomes reddened/irritated stop using the CHG.  Do not shave (including legs and underarms) for at least 48 hours prior to first CHG shower. It is OK to shave your face.  Please follow these instructions carefully.     Shower the [employer]  BEFORE SURGERY and the MORNING OF SURGERY with CHG Soap.   If you chose to wash your hair, wash your hair first as usual with your normal shampoo. After you shampoo, rinse your hair and body thoroughly to remove the shampoo.  Then [employer] and genitals (private parts) with your normal soap and rinse thoroughly to remove soap.  After that Use CHG Soap as you would any other liquid soap. You can apply CHG directly to the skin and wash gently with a scrungie or a clean washcloth.   Apply the CHG Soap to your body ONLY FROM THE NECK DOWN.  Do not use on open wounds or open sores. Avoid contact with your eyes, ears, mouth and genitals (private parts). Wash Face and genitals (private parts)  with your normal soap.   Wash thoroughly, paying special attention to the area where your surgery will be performed.  Thoroughly rinse your body with warm water from the neck down.  DO NOT shower/wash with your normal soap after using and rinsing off the CHG Soap.  Pat  yourself dry with a CLEAN TOWEL.  Wear CLEAN PAJAMAS to bed the night before surgery  Place CLEAN SHEETS on your bed the night before your surgery  DO NOT SLEEP WITH PETS.   Day of Surgery:  Take a shower with CHG soap. Wear Clean/Comfortable clothing the morning of surgery Do not apply any deodorants/lotions.   Remember to brush your teeth WITH YOUR REGULAR TOOTHPASTE.   Please read over the following fact sheets that you were given.

## 2021-01-24 MED ORDER — TRANEXAMIC ACID (OHS) PUMP PRIME SOLUTION
2.0000 mg/kg | INTRAVENOUS | Status: DC
  Filled 2021-01-24: qty 1.66

## 2021-01-24 MED ORDER — MANNITOL 20 % IV SOLN
INTRAVENOUS | Status: DC
  Filled 2021-01-24: qty 13

## 2021-01-24 MED ORDER — NITROGLYCERIN IN D5W 200-5 MCG/ML-% IV SOLN
2.0000 ug/min | INTRAVENOUS | Status: DC
  Filled 2021-01-24: qty 250

## 2021-01-24 MED ORDER — PLASMA-LYTE A IV SOLN
INTRAVENOUS | Status: DC
  Filled 2021-01-24: qty 5

## 2021-01-24 MED ORDER — NOREPINEPHRINE 4 MG/250ML-% IV SOLN
0.0000 ug/min | INTRAVENOUS | Status: AC
  Administered 2021-01-25: 2 ug/min via INTRAVENOUS
  Filled 2021-01-24: qty 250

## 2021-01-24 MED ORDER — CEFAZOLIN SODIUM-DEXTROSE 2-4 GM/100ML-% IV SOLN
2.0000 g | INTRAVENOUS | Status: AC
  Administered 2021-01-25 (×2): 2 g via INTRAVENOUS
  Filled 2021-01-24: qty 100

## 2021-01-24 MED ORDER — MILRINONE LACTATE IN DEXTROSE 20-5 MG/100ML-% IV SOLN
0.3000 ug/kg/min | INTRAVENOUS | Status: DC
  Filled 2021-01-24: qty 100

## 2021-01-24 MED ORDER — TRANEXAMIC ACID 1000 MG/10ML IV SOLN
1.5000 mg/kg/h | INTRAVENOUS | Status: AC
  Administered 2021-01-25: 1.5 mg/kg/h via INTRAVENOUS
  Filled 2021-01-24: qty 25

## 2021-01-24 MED ORDER — HEPARIN 30,000 UNITS/1000 ML (OHS) CELLSAVER SOLUTION
Status: DC
  Filled 2021-01-24: qty 1000

## 2021-01-24 MED ORDER — VANCOMYCIN HCL 1250 MG/250ML IV SOLN
1250.0000 mg | INTRAVENOUS | Status: AC
  Administered 2021-01-25: 1250 mg via INTRAVENOUS
  Filled 2021-01-24: qty 250

## 2021-01-24 MED ORDER — CEFAZOLIN SODIUM-DEXTROSE 2-4 GM/100ML-% IV SOLN
2.0000 g | INTRAVENOUS | Status: DC
  Filled 2021-01-24: qty 100

## 2021-01-24 MED ORDER — POTASSIUM CHLORIDE 2 MEQ/ML IV SOLN
80.0000 meq | INTRAVENOUS | Status: DC
  Filled 2021-01-24: qty 40

## 2021-01-24 MED ORDER — INSULIN REGULAR(HUMAN) IN NACL 100-0.9 UT/100ML-% IV SOLN
INTRAVENOUS | Status: AC
  Administered 2021-01-25: 12 [IU]/h via INTRAVENOUS
  Filled 2021-01-24: qty 100

## 2021-01-24 MED ORDER — EPINEPHRINE HCL 5 MG/250ML IV SOLN IN NS
0.0000 ug/min | INTRAVENOUS | Status: DC
  Filled 2021-01-24: qty 250

## 2021-01-24 MED ORDER — PHENYLEPHRINE HCL-NACL 20-0.9 MG/250ML-% IV SOLN
30.0000 ug/min | INTRAVENOUS | Status: AC
  Administered 2021-01-25: 25 ug/min via INTRAVENOUS
  Filled 2021-01-24: qty 250

## 2021-01-24 MED ORDER — TRANEXAMIC ACID (OHS) BOLUS VIA INFUSION
15.0000 mg/kg | INTRAVENOUS | Status: AC
  Administered 2021-01-25: 1246.5 mg via INTRAVENOUS
  Filled 2021-01-24: qty 1247

## 2021-01-24 MED ORDER — DEXMEDETOMIDINE HCL IN NACL 400 MCG/100ML IV SOLN
0.1000 ug/kg/h | INTRAVENOUS | Status: AC
  Administered 2021-01-25: .7 ug/kg/h via INTRAVENOUS
  Filled 2021-01-24: qty 100

## 2021-01-25 ENCOUNTER — Inpatient Hospital Stay (HOSPITAL_COMMUNITY)
Admission: RE | Admit: 2021-01-25 | Discharge: 2021-02-02 | DRG: 236 | Disposition: A | Discharge status: Skilled Nursing Facility | Payer: Medicare Other | Attending: Thoracic Surgery (Cardiothoracic Vascular Surgery) | Admitting: Thoracic Surgery (Cardiothoracic Vascular Surgery)

## 2021-01-25 ENCOUNTER — Inpatient Hospital Stay (HOSPITAL_COMMUNITY): Payer: Medicare Other | Admitting: Certified Registered Nurse Anesthetist

## 2021-01-25 ENCOUNTER — Inpatient Hospital Stay (HOSPITAL_COMMUNITY)
Admission: RE | Disposition: A | Discharge status: Skilled Nursing Facility | Payer: Self-pay | Source: Home / Self Care | Attending: Thoracic Surgery (Cardiothoracic Vascular Surgery)

## 2021-01-25 ENCOUNTER — Encounter (HOSPITAL_COMMUNITY): Payer: Self-pay | Admitting: Thoracic Surgery (Cardiothoracic Vascular Surgery)

## 2021-01-25 ENCOUNTER — Inpatient Hospital Stay (HOSPITAL_COMMUNITY): Payer: Medicare Other | Admitting: Physician Assistant

## 2021-01-25 ENCOUNTER — Inpatient Hospital Stay (HOSPITAL_COMMUNITY): Payer: Medicare Other

## 2021-01-25 ENCOUNTER — Other Ambulatory Visit: Payer: Self-pay

## 2021-01-25 DIAGNOSIS — D6959 Other secondary thrombocytopenia: Secondary | ICD-10-CM | POA: Diagnosis not present

## 2021-01-25 DIAGNOSIS — M6281 Muscle weakness (generalized): Secondary | ICD-10-CM | POA: Diagnosis not present

## 2021-01-25 DIAGNOSIS — E669 Obesity, unspecified: Secondary | ICD-10-CM | POA: Diagnosis not present

## 2021-01-25 DIAGNOSIS — J449 Chronic obstructive pulmonary disease, unspecified: Secondary | ICD-10-CM | POA: Diagnosis not present

## 2021-01-25 DIAGNOSIS — E785 Hyperlipidemia, unspecified: Secondary | ICD-10-CM | POA: Diagnosis present

## 2021-01-25 DIAGNOSIS — T82855A Stenosis of coronary artery stent, initial encounter: Secondary | ICD-10-CM | POA: Diagnosis present

## 2021-01-25 DIAGNOSIS — M19012 Primary osteoarthritis, left shoulder: Secondary | ICD-10-CM | POA: Diagnosis not present

## 2021-01-25 DIAGNOSIS — S0121XA Laceration without foreign body of nose, initial encounter: Secondary | ICD-10-CM | POA: Diagnosis not present

## 2021-01-25 DIAGNOSIS — Z6832 Body mass index (BMI) 32.0-32.9, adult: Secondary | ICD-10-CM

## 2021-01-25 DIAGNOSIS — R278 Other lack of coordination: Secondary | ICD-10-CM | POA: Diagnosis not present

## 2021-01-25 DIAGNOSIS — Z955 Presence of coronary angioplasty implant and graft: Secondary | ICD-10-CM | POA: Diagnosis not present

## 2021-01-25 DIAGNOSIS — Z20822 Contact with and (suspected) exposure to covid-19: Secondary | ICD-10-CM | POA: Diagnosis not present

## 2021-01-25 DIAGNOSIS — I25119 Atherosclerotic heart disease of native coronary artery with unspecified angina pectoris: Secondary | ICD-10-CM | POA: Diagnosis not present

## 2021-01-25 DIAGNOSIS — S0083XA Contusion of other part of head, initial encounter: Secondary | ICD-10-CM | POA: Diagnosis not present

## 2021-01-25 DIAGNOSIS — J9811 Atelectasis: Secondary | ICD-10-CM | POA: Diagnosis not present

## 2021-01-25 DIAGNOSIS — Z9181 History of falling: Secondary | ICD-10-CM | POA: Diagnosis not present

## 2021-01-25 DIAGNOSIS — G319 Degenerative disease of nervous system, unspecified: Secondary | ICD-10-CM | POA: Diagnosis not present

## 2021-01-25 DIAGNOSIS — Z885 Allergy status to narcotic agent status: Secondary | ICD-10-CM

## 2021-01-25 DIAGNOSIS — E1142 Type 2 diabetes mellitus with diabetic polyneuropathy: Secondary | ICD-10-CM | POA: Diagnosis not present

## 2021-01-25 DIAGNOSIS — E1165 Type 2 diabetes mellitus with hyperglycemia: Secondary | ICD-10-CM | POA: Diagnosis present

## 2021-01-25 DIAGNOSIS — E0821 Diabetes mellitus due to underlying condition with diabetic nephropathy: Secondary | ICD-10-CM

## 2021-01-25 DIAGNOSIS — Z825 Family history of asthma and other chronic lower respiratory diseases: Secondary | ICD-10-CM

## 2021-01-25 DIAGNOSIS — Z951 Presence of aortocoronary bypass graft: Secondary | ICD-10-CM

## 2021-01-25 DIAGNOSIS — Z4682 Encounter for fitting and adjustment of non-vascular catheter: Secondary | ICD-10-CM | POA: Diagnosis not present

## 2021-01-25 DIAGNOSIS — Z09 Encounter for follow-up examination after completed treatment for conditions other than malignant neoplasm: Secondary | ICD-10-CM

## 2021-01-25 DIAGNOSIS — I081 Rheumatic disorders of both mitral and tricuspid valves: Secondary | ICD-10-CM | POA: Diagnosis not present

## 2021-01-25 DIAGNOSIS — Z7902 Long term (current) use of antithrombotics/antiplatelets: Secondary | ICD-10-CM | POA: Diagnosis not present

## 2021-01-25 DIAGNOSIS — G4733 Obstructive sleep apnea (adult) (pediatric): Secondary | ICD-10-CM | POA: Diagnosis present

## 2021-01-25 DIAGNOSIS — Y831 Surgical operation with implant of artificial internal device as the cause of abnormal reaction of the patient, or of later complication, without mention of misadventure at the time of the procedure: Secondary | ICD-10-CM | POA: Diagnosis present

## 2021-01-25 DIAGNOSIS — J90 Pleural effusion, not elsewhere classified: Secondary | ICD-10-CM | POA: Diagnosis not present

## 2021-01-25 DIAGNOSIS — K5730 Diverticulosis of large intestine without perforation or abscess without bleeding: Secondary | ICD-10-CM | POA: Diagnosis present

## 2021-01-25 DIAGNOSIS — M109 Gout, unspecified: Secondary | ICD-10-CM | POA: Diagnosis present

## 2021-01-25 DIAGNOSIS — J939 Pneumothorax, unspecified: Secondary | ICD-10-CM

## 2021-01-25 DIAGNOSIS — E8770 Fluid overload, unspecified: Secondary | ICD-10-CM | POA: Diagnosis present

## 2021-01-25 DIAGNOSIS — E1121 Type 2 diabetes mellitus with diabetic nephropathy: Secondary | ICD-10-CM | POA: Diagnosis not present

## 2021-01-25 DIAGNOSIS — W07XXXA Fall from chair, initial encounter: Secondary | ICD-10-CM | POA: Diagnosis not present

## 2021-01-25 DIAGNOSIS — Z794 Long term (current) use of insulin: Secondary | ICD-10-CM | POA: Diagnosis not present

## 2021-01-25 DIAGNOSIS — R9430 Abnormal result of cardiovascular function study, unspecified: Secondary | ICD-10-CM

## 2021-01-25 DIAGNOSIS — Z7401 Bed confinement status: Secondary | ICD-10-CM | POA: Diagnosis not present

## 2021-01-25 DIAGNOSIS — I2510 Atherosclerotic heart disease of native coronary artery without angina pectoris: Principal | ICD-10-CM

## 2021-01-25 DIAGNOSIS — Z888 Allergy status to other drugs, medicaments and biological substances status: Secondary | ICD-10-CM | POA: Diagnosis not present

## 2021-01-25 DIAGNOSIS — Z8249 Family history of ischemic heart disease and other diseases of the circulatory system: Secondary | ICD-10-CM

## 2021-01-25 DIAGNOSIS — Z452 Encounter for adjustment and management of vascular access device: Secondary | ICD-10-CM | POA: Diagnosis not present

## 2021-01-25 DIAGNOSIS — D62 Acute posthemorrhagic anemia: Secondary | ICD-10-CM | POA: Diagnosis not present

## 2021-01-25 DIAGNOSIS — Z79899 Other long term (current) drug therapy: Secondary | ICD-10-CM | POA: Diagnosis not present

## 2021-01-25 DIAGNOSIS — R2689 Other abnormalities of gait and mobility: Secondary | ICD-10-CM | POA: Diagnosis not present

## 2021-01-25 DIAGNOSIS — S0003XA Contusion of scalp, initial encounter: Secondary | ICD-10-CM | POA: Diagnosis not present

## 2021-01-25 DIAGNOSIS — Z87891 Personal history of nicotine dependence: Secondary | ICD-10-CM

## 2021-01-25 DIAGNOSIS — Z7984 Long term (current) use of oral hypoglycemic drugs: Secondary | ICD-10-CM

## 2021-01-25 DIAGNOSIS — I10 Essential (primary) hypertension: Secondary | ICD-10-CM | POA: Diagnosis not present

## 2021-01-25 DIAGNOSIS — Z48812 Encounter for surgical aftercare following surgery on the circulatory system: Secondary | ICD-10-CM | POA: Diagnosis not present

## 2021-01-25 DIAGNOSIS — R918 Other nonspecific abnormal finding of lung field: Secondary | ICD-10-CM | POA: Diagnosis not present

## 2021-01-25 DIAGNOSIS — S0993XA Unspecified injury of face, initial encounter: Secondary | ICD-10-CM | POA: Diagnosis not present

## 2021-01-25 DIAGNOSIS — K567 Ileus, unspecified: Secondary | ICD-10-CM | POA: Diagnosis present

## 2021-01-25 DIAGNOSIS — R531 Weakness: Secondary | ICD-10-CM | POA: Diagnosis not present

## 2021-01-25 HISTORY — PX: TEE WITHOUT CARDIOVERSION: SHX5443

## 2021-01-25 HISTORY — PX: CORONARY ARTERY BYPASS GRAFT: SHX141

## 2021-01-25 HISTORY — PX: ENDOVEIN HARVEST OF GREATER SAPHENOUS VEIN: SHX5059

## 2021-01-25 LAB — CBC
HCT: 33.4 % — ABNORMAL LOW (ref 39.0–52.0)
HCT: 29.1 % — ABNORMAL LOW (ref 39.0–52.0)
Hemoglobin: 10.9 g/dL — ABNORMAL LOW (ref 13.0–17.0)
Hemoglobin: 9.5 g/dL — ABNORMAL LOW (ref 13.0–17.0)
MCH: 30 pg (ref 26.0–34.0)
MCH: 29.7 pg (ref 26.0–34.0)
MCHC: 32.6 g/dL (ref 30.0–36.0)
MCHC: 32.6 g/dL (ref 30.0–36.0)
MCV: 92 fL (ref 80.0–100.0)
MCV: 90.9 fL (ref 80.0–100.0)
Platelets: 184 10*3/uL (ref 150–400)
Platelets: 145 10*3/uL — ABNORMAL LOW (ref 150–400)
RBC: 3.63 MIL/uL — ABNORMAL LOW (ref 4.22–5.81)
RBC: 3.2 MIL/uL — ABNORMAL LOW (ref 4.22–5.81)
RDW: 13.7 % (ref 11.5–15.5)
RDW: 13.7 % (ref 11.5–15.5)
WBC: 24.9 10*3/uL — ABNORMAL HIGH (ref 4.0–10.5)
WBC: 14.4 10*3/uL — ABNORMAL HIGH (ref 4.0–10.5)
nRBC: 0 % (ref 0.0–0.2)
nRBC: 0 % (ref 0.0–0.2)

## 2021-01-25 LAB — GLUCOSE, CAPILLARY
Glucose-Capillary: 224 mg/dL — ABNORMAL HIGH (ref 70–99)
Glucose-Capillary: 159 mg/dL — ABNORMAL HIGH (ref 70–99)
Glucose-Capillary: 131 mg/dL — ABNORMAL HIGH (ref 70–99)
Glucose-Capillary: 145 mg/dL — ABNORMAL HIGH (ref 70–99)
Glucose-Capillary: 171 mg/dL — ABNORMAL HIGH (ref 70–99)
Glucose-Capillary: 170 mg/dL — ABNORMAL HIGH (ref 70–99)
Glucose-Capillary: 169 mg/dL — ABNORMAL HIGH (ref 70–99)
Glucose-Capillary: 176 mg/dL — ABNORMAL HIGH (ref 70–99)
Glucose-Capillary: 168 mg/dL — ABNORMAL HIGH (ref 70–99)
Glucose-Capillary: 174 mg/dL — ABNORMAL HIGH (ref 70–99)
Glucose-Capillary: 175 mg/dL — ABNORMAL HIGH (ref 70–99)
Glucose-Capillary: 175 mg/dL — ABNORMAL HIGH (ref 70–99)

## 2021-01-25 LAB — POCT I-STAT 7, (LYTES, BLD GAS, ICA,H+H)
Acid-Base Excess: 4 mmol/L — ABNORMAL HIGH (ref 0.0–2.0)
Acid-Base Excess: 1 mmol/L (ref 0.0–2.0)
Acid-base deficit: 2 mmol/L (ref 0.0–2.0)
Acid-base deficit: 1 mmol/L (ref 0.0–2.0)
Acid-base deficit: 2 mmol/L (ref 0.0–2.0)
Acid-base deficit: 4 mmol/L — ABNORMAL HIGH (ref 0.0–2.0)
Acid-base deficit: 4 mmol/L — ABNORMAL HIGH (ref 0.0–2.0)
Acid-base deficit: 3 mmol/L — ABNORMAL HIGH (ref 0.0–2.0)
Bicarbonate: 23.4 mmol/L (ref 20.0–28.0)
Bicarbonate: 25.4 mmol/L (ref 20.0–28.0)
Bicarbonate: 28.3 mmol/L — ABNORMAL HIGH (ref 20.0–28.0)
Bicarbonate: 25.1 mmol/L (ref 20.0–28.0)
Bicarbonate: 25.2 mmol/L (ref 20.0–28.0)
Bicarbonate: 22.4 mmol/L (ref 20.0–28.0)
Bicarbonate: 20.2 mmol/L (ref 20.0–28.0)
Bicarbonate: 20.8 mmol/L (ref 20.0–28.0)
Calcium, Ion: 1.22 mmol/L (ref 1.15–1.40)
Calcium, Ion: 1.01 mmol/L — ABNORMAL LOW (ref 1.15–1.40)
Calcium, Ion: 1.03 mmol/L — ABNORMAL LOW (ref 1.15–1.40)
Calcium, Ion: 1.01 mmol/L — ABNORMAL LOW (ref 1.15–1.40)
Calcium, Ion: 1.26 mmol/L (ref 1.15–1.40)
Calcium, Ion: 1.17 mmol/L (ref 1.15–1.40)
Calcium, Ion: 1.09 mmol/L — ABNORMAL LOW (ref 1.15–1.40)
Calcium, Ion: 1.09 mmol/L — ABNORMAL LOW (ref 1.15–1.40)
HCT: 40 % (ref 39.0–52.0)
HCT: 26 % — ABNORMAL LOW (ref 39.0–52.0)
HCT: 29 % — ABNORMAL LOW (ref 39.0–52.0)
HCT: 25 % — ABNORMAL LOW (ref 39.0–52.0)
HCT: 34 % — ABNORMAL LOW (ref 39.0–52.0)
HCT: 33 % — ABNORMAL LOW (ref 39.0–52.0)
HCT: 29 % — ABNORMAL LOW (ref 39.0–52.0)
HCT: 28 % — ABNORMAL LOW (ref 39.0–52.0)
Hemoglobin: 13.6 g/dL (ref 13.0–17.0)
Hemoglobin: 8.8 g/dL — ABNORMAL LOW (ref 13.0–17.0)
Hemoglobin: 9.9 g/dL — ABNORMAL LOW (ref 13.0–17.0)
Hemoglobin: 8.5 g/dL — ABNORMAL LOW (ref 13.0–17.0)
Hemoglobin: 11.6 g/dL — ABNORMAL LOW (ref 13.0–17.0)
Hemoglobin: 11.2 g/dL — ABNORMAL LOW (ref 13.0–17.0)
Hemoglobin: 9.9 g/dL — ABNORMAL LOW (ref 13.0–17.0)
Hemoglobin: 9.5 g/dL — ABNORMAL LOW (ref 13.0–17.0)
O2 Saturation: 100 %
O2 Saturation: 100 %
O2 Saturation: 100 %
O2 Saturation: 100 %
O2 Saturation: 96 %
O2 Saturation: 89 %
O2 Saturation: 93 %
O2 Saturation: 94 %
Patient temperature: 37.2
Patient temperature: 37
Patient temperature: 37.3
Potassium: 3.7 mmol/L (ref 3.5–5.1)
Potassium: 4 mmol/L (ref 3.5–5.1)
Potassium: 4.3 mmol/L (ref 3.5–5.1)
Potassium: 4.9 mmol/L (ref 3.5–5.1)
Potassium: 4.2 mmol/L (ref 3.5–5.1)
Potassium: 4 mmol/L (ref 3.5–5.1)
Potassium: 3.8 mmol/L (ref 3.5–5.1)
Potassium: 3.8 mmol/L (ref 3.5–5.1)
Sodium: 141 mmol/L (ref 135–145)
Sodium: 140 mmol/L (ref 135–145)
Sodium: 141 mmol/L (ref 135–145)
Sodium: 140 mmol/L (ref 135–145)
Sodium: 142 mmol/L (ref 135–145)
Sodium: 141 mmol/L (ref 135–145)
Sodium: 141 mmol/L (ref 135–145)
Sodium: 140 mmol/L (ref 135–145)
TCO2: 25 mmol/L (ref 22–32)
TCO2: 27 mmol/L (ref 22–32)
TCO2: 30 mmol/L (ref 22–32)
TCO2: 26 mmol/L (ref 22–32)
TCO2: 27 mmol/L (ref 22–32)
TCO2: 24 mmol/L (ref 22–32)
TCO2: 21 mmol/L — ABNORMAL LOW (ref 22–32)
TCO2: 22 mmol/L (ref 22–32)
pCO2 arterial: 41.1 mmHg (ref 32.0–48.0)
pCO2 arterial: 42.7 mmHg (ref 32.0–48.0)
pCO2 arterial: 49.3 mmHg — ABNORMAL HIGH (ref 32.0–48.0)
pCO2 arterial: 36.8 mmHg (ref 32.0–48.0)
pCO2 arterial: 51.7 mmHg — ABNORMAL HIGH (ref 32.0–48.0)
pCO2 arterial: 45.3 mmHg (ref 32.0–48.0)
pCO2 arterial: 34.2 mmHg (ref 32.0–48.0)
pCO2 arterial: 34.1 mmHg (ref 32.0–48.0)
pH, Arterial: 7.363 (ref 7.350–7.450)
pH, Arterial: 7.321 — ABNORMAL LOW (ref 7.350–7.450)
pH, Arterial: 7.43 (ref 7.350–7.450)
pH, Arterial: 7.442 (ref 7.350–7.450)
pH, Arterial: 7.297 — ABNORMAL LOW (ref 7.350–7.450)
pH, Arterial: 7.303 — ABNORMAL LOW (ref 7.350–7.450)
pH, Arterial: 7.38 (ref 7.350–7.450)
pH, Arterial: 7.395 (ref 7.350–7.450)
pO2, Arterial: 254 mmHg — ABNORMAL HIGH (ref 83.0–108.0)
pO2, Arterial: 395 mmHg — ABNORMAL HIGH (ref 83.0–108.0)
pO2, Arterial: 396 mmHg — ABNORMAL HIGH (ref 83.0–108.0)
pO2, Arterial: 320 mmHg — ABNORMAL HIGH (ref 83.0–108.0)
pO2, Arterial: 89 mmHg (ref 83.0–108.0)
pO2, Arterial: 63 mmHg — ABNORMAL LOW (ref 83.0–108.0)
pO2, Arterial: 68 mmHg — ABNORMAL LOW (ref 83.0–108.0)
pO2, Arterial: 71 mmHg — ABNORMAL LOW (ref 83.0–108.0)

## 2021-01-25 LAB — POCT I-STAT, CHEM 8
BUN: 12 mg/dL (ref 8–23)
BUN: 12 mg/dL (ref 8–23)
BUN: 14 mg/dL (ref 8–23)
BUN: 13 mg/dL (ref 8–23)
Calcium, Ion: 1.2 mmol/L (ref 1.15–1.40)
Calcium, Ion: 1.21 mmol/L (ref 1.15–1.40)
Calcium, Ion: 1.02 mmol/L — ABNORMAL LOW (ref 1.15–1.40)
Calcium, Ion: 1.26 mmol/L (ref 1.15–1.40)
Chloride: 102 mmol/L (ref 98–111)
Chloride: 101 mmol/L (ref 98–111)
Chloride: 104 mmol/L (ref 98–111)
Chloride: 105 mmol/L (ref 98–111)
Creatinine, Ser: 0.7 mg/dL (ref 0.61–1.24)
Creatinine, Ser: 0.8 mg/dL (ref 0.61–1.24)
Creatinine, Ser: 0.8 mg/dL (ref 0.61–1.24)
Creatinine, Ser: 0.8 mg/dL (ref 0.61–1.24)
Glucose, Bld: 240 mg/dL — ABNORMAL HIGH (ref 70–99)
Glucose, Bld: 225 mg/dL — ABNORMAL HIGH (ref 70–99)
Glucose, Bld: 182 mg/dL — ABNORMAL HIGH (ref 70–99)
Glucose, Bld: 164 mg/dL — ABNORMAL HIGH (ref 70–99)
HCT: 32 % — ABNORMAL LOW (ref 39.0–52.0)
HCT: 36 % — ABNORMAL LOW (ref 39.0–52.0)
HCT: 57 % — ABNORMAL HIGH (ref 39.0–52.0)
HCT: 26 % — ABNORMAL LOW (ref 39.0–52.0)
Hemoglobin: 10.9 g/dL — ABNORMAL LOW (ref 13.0–17.0)
Hemoglobin: 12.2 g/dL — ABNORMAL LOW (ref 13.0–17.0)
Hemoglobin: 19.4 g/dL — ABNORMAL HIGH (ref 13.0–17.0)
Hemoglobin: 8.8 g/dL — ABNORMAL LOW (ref 13.0–17.0)
Potassium: 3.7 mmol/L (ref 3.5–5.1)
Potassium: 3.7 mmol/L (ref 3.5–5.1)
Potassium: 4.9 mmol/L (ref 3.5–5.1)
Potassium: 4.2 mmol/L (ref 3.5–5.1)
Sodium: 140 mmol/L (ref 135–145)
Sodium: 139 mmol/L (ref 135–145)
Sodium: 139 mmol/L (ref 135–145)
Sodium: 140 mmol/L (ref 135–145)
TCO2: 28 mmol/L (ref 22–32)
TCO2: 27 mmol/L (ref 22–32)
TCO2: 25 mmol/L (ref 22–32)
TCO2: 27 mmol/L (ref 22–32)

## 2021-01-25 LAB — POCT I-STAT EG7
Acid-base deficit: 4 mmol/L — ABNORMAL HIGH (ref 0.0–2.0)
Bicarbonate: 24.1 mmol/L (ref 20.0–28.0)
Calcium, Ion: 1.02 mmol/L — ABNORMAL LOW (ref 1.15–1.40)
HCT: 28 % — ABNORMAL LOW (ref 39.0–52.0)
Hemoglobin: 9.5 g/dL — ABNORMAL LOW (ref 13.0–17.0)
O2 Saturation: 76 %
Potassium: 4.4 mmol/L (ref 3.5–5.1)
Sodium: 140 mmol/L (ref 135–145)
TCO2: 26 mmol/L (ref 22–32)
pCO2, Ven: 58.4 mmHg (ref 44.0–60.0)
pH, Ven: 7.223 — ABNORMAL LOW (ref 7.250–7.430)
pO2, Ven: 49 mmHg — ABNORMAL HIGH (ref 32.0–45.0)

## 2021-01-25 LAB — PROTIME-INR
INR: 1.5 — ABNORMAL HIGH (ref 0.8–1.2)
Prothrombin Time: 18.2 seconds — ABNORMAL HIGH (ref 11.4–15.2)

## 2021-01-25 LAB — ABO/RH: ABO/RH(D): O POS

## 2021-01-25 LAB — BASIC METABOLIC PANEL
Anion gap: 8 (ref 5–15)
BUN: 10 mg/dL (ref 8–23)
CO2: 22 mmol/L (ref 22–32)
Calcium: 7.8 mg/dL — ABNORMAL LOW (ref 8.9–10.3)
Chloride: 106 mmol/L (ref 98–111)
Creatinine, Ser: 0.9 mg/dL (ref 0.61–1.24)
GFR, Estimated: >60 mL/min (ref >60)
Glucose, Bld: 181 mg/dL — ABNORMAL HIGH (ref 70–99)
Potassium: 4 mmol/L (ref 3.5–5.1)
Sodium: 136 mmol/L (ref 135–145)

## 2021-01-25 LAB — HEMOGLOBIN AND HEMATOCRIT, BLOOD
HCT: 25.5 % — ABNORMAL LOW (ref 39.0–52.0)
Hemoglobin: 8.2 g/dL — ABNORMAL LOW (ref 13.0–17.0)

## 2021-01-25 LAB — APTT: aPTT: 36 seconds (ref 24–36)

## 2021-01-25 LAB — ECHO INTRAOPERATIVE TEE
Height: 65 in
Single Plane A2C EF: 68.8 %
Weight: 3040 oz

## 2021-01-25 LAB — PLATELET COUNT: Platelets: 178 10*3/uL (ref 150–400)

## 2021-01-25 LAB — MAGNESIUM: Magnesium: 2.6 mg/dL — ABNORMAL HIGH (ref 1.7–2.4)

## 2021-01-25 SURGERY — CORONARY ARTERY BYPASS GRAFTING (CABG)
Anesthesia: General | Site: Chest

## 2021-01-25 MED ORDER — SODIUM CHLORIDE 0.9% FLUSH
3.0000 mL | Freq: Two times a day (BID) | INTRAVENOUS | Status: DC
  Administered 2021-01-26 – 2021-01-28 (×4): 3 mL via INTRAVENOUS

## 2021-01-25 MED ORDER — PROTAMINE SULFATE 10 MG/ML IV SOLN
INTRAVENOUS | Status: DC | PRN
  Administered 2021-01-25: 30 mg via INTRAVENOUS
  Administered 2021-01-25: 270 mg via INTRAVENOUS

## 2021-01-25 MED ORDER — PHENYLEPHRINE HCL-NACL 20-0.9 MG/250ML-% IV SOLN: 0.0000 ug/min | INTRAVENOUS | Status: DC

## 2021-01-25 MED ORDER — ACETAMINOPHEN 160 MG/5ML PO SOLN: 650.0000 mg | Freq: Once | ORAL | Status: AC

## 2021-01-25 MED ORDER — SODIUM CHLORIDE 0.9 % IV SOLN: INTRAVENOUS | Status: DC

## 2021-01-25 MED ORDER — GABAPENTIN 300 MG PO CAPS
600.0000 mg | ORAL_CAPSULE | Freq: Two times a day (BID) | ORAL | Status: DC
  Administered 2021-01-26 – 2021-02-02 (×15): 600 mg via ORAL
  Filled 2021-01-25 (×15): qty 2

## 2021-01-25 MED ORDER — ASPIRIN EC 325 MG PO TBEC: 325.0000 mg | DELAYED_RELEASE_TABLET | Freq: Every day | ORAL | Status: DC

## 2021-01-25 MED ORDER — LACTATED RINGERS IV SOLN: INTRAVENOUS | Status: DC

## 2021-01-25 MED ORDER — VANCOMYCIN HCL IN DEXTROSE 1-5 GM/200ML-% IV SOLN
1000.0000 mg | Freq: Once | INTRAVENOUS | Status: AC
  Administered 2021-01-25: 1000 mg via INTRAVENOUS
  Filled 2021-01-25: qty 200

## 2021-01-25 MED ORDER — METOPROLOL TARTRATE 12.5 MG HALF TABLET
12.5000 mg | ORAL_TABLET | Freq: Once | ORAL | Status: AC
  Administered 2021-01-25: 12.5 mg via ORAL
  Filled 2021-01-25: qty 1

## 2021-01-25 MED ORDER — FENTANYL CITRATE (PF) 250 MCG/5ML IJ SOLN
INTRAMUSCULAR | Status: AC
  Filled 2021-01-25: qty 25

## 2021-01-25 MED ORDER — PLASMA-LYTE A IV SOLN
INTRAVENOUS | Status: DC | PRN
  Administered 2021-01-25: 1000 mL via INTRAVASCULAR

## 2021-01-25 MED ORDER — MIDAZOLAM HCL 2 MG/2ML IJ SOLN: 2.0000 mg | INTRAMUSCULAR | Status: DC | PRN

## 2021-01-25 MED ORDER — ACETAMINOPHEN 500 MG PO TABS
1000.0000 mg | ORAL_TABLET | Freq: Four times a day (QID) | ORAL | Status: DC
  Administered 2021-01-26 (×2): 1000 mg via ORAL
  Filled 2021-01-25 (×2): qty 2

## 2021-01-25 MED ORDER — ROSUVASTATIN CALCIUM 20 MG PO TABS
20.0000 mg | ORAL_TABLET | Freq: Every day | ORAL | Status: DC
  Administered 2021-01-26 – 2021-02-02 (×8): 20 mg via ORAL
  Filled 2021-01-25 (×8): qty 1

## 2021-01-25 MED ORDER — LACTATED RINGERS IV SOLN: INTRAVENOUS | Status: DC | PRN

## 2021-01-25 MED ORDER — PHENYLEPHRINE 40 MCG/ML (10ML) SYRINGE FOR IV PUSH (FOR BLOOD PRESSURE SUPPORT)
PREFILLED_SYRINGE | INTRAVENOUS | Status: DC | PRN
  Administered 2021-01-25 (×5): 40 ug via INTRAVENOUS
  Administered 2021-01-25: 80 ug via INTRAVENOUS
  Administered 2021-01-25 (×4): 40 ug via INTRAVENOUS
  Administered 2021-01-25: 80 ug via INTRAVENOUS
  Administered 2021-01-25 (×2): 40 ug via INTRAVENOUS

## 2021-01-25 MED ORDER — ORAL CARE MOUTH RINSE
15.0000 mL | OROMUCOSAL | Status: DC
  Administered 2021-01-25: 15 mL via OROMUCOSAL

## 2021-01-25 MED ORDER — MIDAZOLAM HCL 5 MG/5ML IJ SOLN
INTRAMUSCULAR | Status: DC | PRN
  Administered 2021-01-25: 2 mg via INTRAVENOUS
  Administered 2021-01-25: 1 mg via INTRAVENOUS
  Administered 2021-01-25 (×2): .5 mg via INTRAVENOUS
  Administered 2021-01-25 (×2): 2 mg via INTRAVENOUS

## 2021-01-25 MED ORDER — MAGNESIUM SULFATE 4 GM/100ML IV SOLN
4.0000 g | Freq: Once | INTRAVENOUS | Status: AC
  Administered 2021-01-25: 4 g via INTRAVENOUS
  Filled 2021-01-25: qty 100

## 2021-01-25 MED ORDER — DEXTROSE 50 % IV SOLN: 0.0000 mL | INTRAVENOUS | Status: DC | PRN

## 2021-01-25 MED ORDER — ALLOPURINOL 100 MG PO TABS: 100.0000 mg | ORAL_TABLET | Freq: Every day | ORAL | Status: DC

## 2021-01-25 MED ORDER — ZOLPIDEM TARTRATE 10 MG PO TABS: 10.0000 mg | ORAL_TABLET | Freq: Every day | ORAL | Status: DC

## 2021-01-25 MED ORDER — LIP MEDEX EX OINT
TOPICAL_OINTMENT | CUTANEOUS | Status: DC | PRN
  Administered 2021-01-26: 75 via TOPICAL
  Filled 2021-01-25: qty 7

## 2021-01-25 MED ORDER — ACETAMINOPHEN 160 MG/5ML PO SOLN: 1000.0000 mg | Freq: Four times a day (QID) | ORAL | Status: DC

## 2021-01-25 MED ORDER — DICLOFENAC SODIUM 1 % EX GEL: 2.0000 g | Freq: Two times a day (BID) | CUTANEOUS | Status: DC | PRN

## 2021-01-25 MED ORDER — ALLOPURINOL 100 MG PO TABS
100.0000 mg | ORAL_TABLET | Freq: Every day | ORAL | Status: DC
  Administered 2021-01-26 – 2021-02-02 (×8): 100 mg via ORAL
  Filled 2021-01-25 (×8): qty 1

## 2021-01-25 MED ORDER — PROPOFOL 10 MG/ML IV BOLUS
INTRAVENOUS | Status: AC
  Filled 2021-01-25: qty 20

## 2021-01-25 MED ORDER — CHLORHEXIDINE GLUCONATE 0.12 % MT SOLN
15.0000 mL | Freq: Once | OROMUCOSAL | Status: DC
  Filled 2021-01-25: qty 15

## 2021-01-25 MED ORDER — SODIUM CHLORIDE (PF) 0.9 % IJ SOLN
OROMUCOSAL | Status: DC | PRN
  Administered 2021-01-25 (×2): 4 mL via TOPICAL

## 2021-01-25 MED ORDER — LACTATED RINGERS IV SOLN
500.0000 mL | Freq: Once | INTRAVENOUS | Status: AC | PRN
  Administered 2021-01-26: 500 mL via INTRAVENOUS

## 2021-01-25 MED ORDER — METOPROLOL TARTRATE 5 MG/5ML IV SOLN
2.5000 mg | INTRAVENOUS | Status: DC | PRN
  Administered 2021-01-26: 2.5 mg via INTRAVENOUS
  Filled 2021-01-25: qty 5

## 2021-01-25 MED ORDER — METOPROLOL TARTRATE 25 MG/10 ML ORAL SUSPENSION
12.5000 mg | Freq: Two times a day (BID) | ORAL | Status: DC
  Filled 2021-01-25: qty 5

## 2021-01-25 MED ORDER — ROCURONIUM BROMIDE 10 MG/ML (PF) SYRINGE
PREFILLED_SYRINGE | INTRAVENOUS | Status: DC | PRN
  Administered 2021-01-25: 50 mg via INTRAVENOUS
  Administered 2021-01-25: 30 mg via INTRAVENOUS
  Administered 2021-01-25: 70 mg via INTRAVENOUS

## 2021-01-25 MED ORDER — CHLORHEXIDINE GLUCONATE CLOTH 2 % EX PADS
6.0000 | MEDICATED_PAD | Freq: Every day | CUTANEOUS | Status: DC
  Administered 2021-01-26 – 2021-02-02 (×7): 6 via TOPICAL

## 2021-01-25 MED ORDER — DOCUSATE SODIUM 100 MG PO CAPS
200.0000 mg | ORAL_CAPSULE | Freq: Every day | ORAL | Status: DC
  Administered 2021-01-26 – 2021-02-02 (×8): 200 mg via ORAL
  Filled 2021-01-25 (×8): qty 2

## 2021-01-25 MED ORDER — ALBUMIN HUMAN 25 % IV SOLN
12.5000 g | INTRAVENOUS | Status: AC | PRN
Start: 2021-01-25 — End: 2021-01-25
  Administered 2021-01-25 (×4): 12.5 g via INTRAVENOUS
  Filled 2021-01-25 (×2): qty 50

## 2021-01-25 MED ORDER — LORATADINE 10 MG PO TABS
10.0000 mg | ORAL_TABLET | Freq: Every day | ORAL | Status: DC
  Administered 2021-01-26 – 2021-02-02 (×8): 10 mg via ORAL
  Filled 2021-01-25 (×8): qty 1

## 2021-01-25 MED ORDER — 0.9 % SODIUM CHLORIDE (POUR BTL) OPTIME
TOPICAL | Status: DC | PRN
  Administered 2021-01-25: 6000 mL

## 2021-01-25 MED ORDER — BISACODYL 10 MG RE SUPP
10.0000 mg | Freq: Every day | RECTAL | Status: DC
  Filled 2021-01-25: qty 1

## 2021-01-25 MED ORDER — PANTOPRAZOLE SODIUM 40 MG PO TBEC
40.0000 mg | DELAYED_RELEASE_TABLET | Freq: Every day | ORAL | Status: DC
  Administered 2021-01-27 – 2021-02-02 (×7): 40 mg via ORAL
  Filled 2021-01-25 (×7): qty 1

## 2021-01-25 MED ORDER — PROPOFOL 10 MG/ML IV BOLUS
INTRAVENOUS | Status: DC | PRN
  Administered 2021-01-25: 120 mg via INTRAVENOUS

## 2021-01-25 MED ORDER — MORPHINE SULFATE (PF) 2 MG/ML IV SOLN
1.0000 mg | INTRAVENOUS | Status: DC | PRN
  Administered 2021-01-25: 4 mg via INTRAVENOUS
  Administered 2021-01-25: 2 mg via INTRAVENOUS
  Administered 2021-01-25 – 2021-01-26 (×3): 4 mg via INTRAVENOUS
  Administered 2021-01-26 – 2021-01-28 (×3): 2 mg via INTRAVENOUS
  Filled 2021-01-25 (×2): qty 1
  Filled 2021-01-25 (×2): qty 2
  Filled 2021-01-25 (×2): qty 1
  Filled 2021-01-25 (×2): qty 2

## 2021-01-25 MED ORDER — CEFAZOLIN SODIUM-DEXTROSE 2-4 GM/100ML-% IV SOLN
2.0000 g | Freq: Three times a day (TID) | INTRAVENOUS | Status: AC
  Administered 2021-01-25 – 2021-01-27 (×6): 2 g via INTRAVENOUS
  Filled 2021-01-25 (×7): qty 100

## 2021-01-25 MED ORDER — CHLORHEXIDINE GLUCONATE 0.12 % MT SOLN: 15.0000 mL | Freq: Two times a day (BID) | OROMUCOSAL | Status: DC

## 2021-01-25 MED ORDER — ~~LOC~~ CARDIAC SURGERY, PATIENT & FAMILY EDUCATION
Freq: Once | Status: DC
  Filled 2021-01-25: qty 1

## 2021-01-25 MED ORDER — FENTANYL CITRATE (PF) 250 MCG/5ML IJ SOLN
INTRAMUSCULAR | Status: DC | PRN
  Administered 2021-01-25: 50 ug via INTRAVENOUS
  Administered 2021-01-25: 100 ug via INTRAVENOUS
  Administered 2021-01-25 (×2): 250 ug via INTRAVENOUS
  Administered 2021-01-25: 25 ug via INTRAVENOUS
  Administered 2021-01-25: 50 ug via INTRAVENOUS
  Administered 2021-01-25: 100 ug via INTRAVENOUS
  Administered 2021-01-25: 25 ug via INTRAVENOUS

## 2021-01-25 MED ORDER — SODIUM CHLORIDE 0.9 % IV BOLUS
250.0000 mL | INTRAVENOUS | Status: DC | PRN
  Administered 2021-01-25: 250 mL via INTRAVENOUS

## 2021-01-25 MED ORDER — ASPIRIN 81 MG PO CHEW: 324.0000 mg | CHEWABLE_TABLET | Freq: Every day | ORAL | Status: DC

## 2021-01-25 MED ORDER — OXYCODONE HCL 5 MG PO TABS
5.0000 mg | ORAL_TABLET | ORAL | Status: DC | PRN
  Administered 2021-01-25 (×2): 5 mg via ORAL
  Administered 2021-01-26 (×3): 10 mg via ORAL
  Administered 2021-01-27 – 2021-01-28 (×6): 5 mg via ORAL
  Administered 2021-01-29 – 2021-02-01 (×12): 10 mg via ORAL
  Filled 2021-01-25: qty 2
  Filled 2021-01-25: qty 1
  Filled 2021-01-25 (×4): qty 2
  Filled 2021-01-25 (×2): qty 1
  Filled 2021-01-25 (×3): qty 2
  Filled 2021-01-25: qty 1
  Filled 2021-01-25: qty 2
  Filled 2021-01-25: qty 1
  Filled 2021-01-25 (×3): qty 2
  Filled 2021-01-25 (×2): qty 1
  Filled 2021-01-25 (×3): qty 2
  Filled 2021-01-25: qty 1
  Filled 2021-01-25: qty 2

## 2021-01-25 MED ORDER — BISACODYL 5 MG PO TBEC
10.0000 mg | DELAYED_RELEASE_TABLET | Freq: Every day | ORAL | Status: DC
  Administered 2021-01-26 – 2021-02-01 (×6): 10 mg via ORAL
  Filled 2021-01-25 (×6): qty 2

## 2021-01-25 MED ORDER — ORAL CARE MOUTH RINSE
15.0000 mL | Freq: Two times a day (BID) | OROMUCOSAL | Status: DC
  Administered 2021-01-25 – 2021-02-02 (×14): 15 mL via OROMUCOSAL

## 2021-01-25 MED ORDER — HEPARIN SODIUM (PORCINE) 1000 UNIT/ML IJ SOLN
INTRAMUSCULAR | Status: DC | PRN
  Administered 2021-01-25: 30000 [IU] via INTRAVENOUS

## 2021-01-25 MED ORDER — ACETAMINOPHEN 650 MG RE SUPP
650.0000 mg | Freq: Once | RECTAL | Status: AC
  Administered 2021-01-25: 650 mg via RECTAL

## 2021-01-25 MED ORDER — SODIUM CHLORIDE 0.45 % IV SOLN: INTRAVENOUS | Status: DC | PRN

## 2021-01-25 MED ORDER — METOPROLOL TARTRATE 12.5 MG HALF TABLET
12.5000 mg | ORAL_TABLET | Freq: Two times a day (BID) | ORAL | Status: DC
  Administered 2021-01-27 – 2021-01-31 (×10): 12.5 mg via ORAL
  Filled 2021-01-25 (×10): qty 1

## 2021-01-25 MED ORDER — ALBUMIN HUMAN 5 % IV SOLN
250.0000 mL | INTRAVENOUS | Status: DC | PRN
Start: 2021-01-25 — End: 2021-01-25

## 2021-01-25 MED ORDER — FAMOTIDINE IN NACL 20-0.9 MG/50ML-% IV SOLN
20.0000 mg | Freq: Two times a day (BID) | INTRAVENOUS | Status: AC
  Administered 2021-01-25 (×2): 20 mg via INTRAVENOUS
  Filled 2021-01-25 (×2): qty 50

## 2021-01-25 MED ORDER — NOREPINEPHRINE 4 MG/250ML-% IV SOLN
0.0000 ug/min | INTRAVENOUS | Status: DC
  Administered 2021-01-25: 12 ug/min via INTRAVENOUS
  Administered 2021-01-26: 8 ug/min via INTRAVENOUS
  Administered 2021-01-26: 10 ug/min via INTRAVENOUS
  Filled 2021-01-25 (×3): qty 250

## 2021-01-25 MED ORDER — SODIUM CHLORIDE 0.9 % IV SOLN: 250.0000 mL | INTRAVENOUS | Status: DC

## 2021-01-25 MED ORDER — CHLORHEXIDINE GLUCONATE 0.12 % MT SOLN
15.0000 mL | OROMUCOSAL | Status: AC
  Administered 2021-01-25: 15 mL via OROMUCOSAL

## 2021-01-25 MED ORDER — DEXMEDETOMIDINE HCL IN NACL 400 MCG/100ML IV SOLN
0.0000 ug/kg/h | INTRAVENOUS | Status: DC
  Filled 2021-01-25: qty 100

## 2021-01-25 MED ORDER — ALBUMIN HUMAN 5 % IV SOLN: INTRAVENOUS | Status: DC | PRN

## 2021-01-25 MED ORDER — SODIUM CHLORIDE 0.9% FLUSH
3.0000 mL | INTRAVENOUS | Status: DC | PRN
  Administered 2021-01-26 (×2): 3 mL via INTRAVENOUS

## 2021-01-25 MED ORDER — NITROGLYCERIN IN D5W 200-5 MCG/ML-% IV SOLN: 0.0000 ug/min | INTRAVENOUS | Status: DC

## 2021-01-25 MED ORDER — CHLORHEXIDINE GLUCONATE 0.12% ORAL RINSE (MEDLINE KIT)
15.0000 mL | Freq: Two times a day (BID) | OROMUCOSAL | Status: DC
  Administered 2021-01-25 – 2021-02-01 (×13): 15 mL via OROMUCOSAL

## 2021-01-25 MED ORDER — INSULIN REGULAR(HUMAN) IN NACL 100-0.9 UT/100ML-% IV SOLN
INTRAVENOUS | Status: AC
  Administered 2021-01-25: 7.5 [IU]/h via INTRAVENOUS
  Administered 2021-01-26: 7 [IU]/h via INTRAVENOUS
  Filled 2021-01-25 (×2): qty 100

## 2021-01-25 MED ORDER — CHLORHEXIDINE GLUCONATE 0.12 % MT SOLN
15.0000 mL | OROMUCOSAL | Status: AC
  Administered 2021-01-25: 15 mL via OROMUCOSAL
  Filled 2021-01-25: qty 15

## 2021-01-25 MED ORDER — MIDAZOLAM HCL (PF) 10 MG/2ML IJ SOLN
INTRAMUSCULAR | Status: AC
  Filled 2021-01-25: qty 2

## 2021-01-25 MED ORDER — ARTIFICIAL TEARS OPHTHALMIC OINT
TOPICAL_OINTMENT | OPHTHALMIC | Status: DC | PRN
  Administered 2021-01-25: 1 via OPHTHALMIC

## 2021-01-25 MED ORDER — CHLORHEXIDINE GLUCONATE 4 % EX LIQD: 30.0000 mL | CUTANEOUS | Status: DC

## 2021-01-25 MED ORDER — CHLORHEXIDINE GLUCONATE CLOTH 2 % EX PADS
6.0000 | MEDICATED_PAD | Freq: Every day | CUTANEOUS | Status: DC
  Administered 2021-01-25: 6 via TOPICAL

## 2021-01-25 MED ORDER — POTASSIUM CHLORIDE 10 MEQ/50ML IV SOLN: 10.0000 meq | INTRAVENOUS | Status: AC

## 2021-01-25 SURGICAL SUPPLY — 93 items
ADH SKN CLS APL DERMABOND .7 (GAUZE/BANDAGES/DRESSINGS) ×8
BAG DECANTER FOR FLEXI CONT (MISCELLANEOUS) ×5 IMPLANT
BLADE CLIPPER SURG (BLADE) ×5 IMPLANT
BLADE STERNUM SYSTEM 6 (BLADE) ×5 IMPLANT
BLADE SURG 11 STRL SS (BLADE) ×4 IMPLANT
BNDG CMPR MED 10X6 ELC LF (GAUZE/BANDAGES/DRESSINGS) ×8
BNDG CMPR STD VLCR NS LF 5.8X4 (GAUZE/BANDAGES/DRESSINGS) ×4
BNDG ELASTIC 4X5.8 VLCR NS LF (GAUZE/BANDAGES/DRESSINGS) ×3 IMPLANT
BNDG ELASTIC 4X5.8 VLCR STR LF (GAUZE/BANDAGES/DRESSINGS) ×5 IMPLANT
BNDG ELASTIC 6X10 VLCR STRL LF (GAUZE/BANDAGES/DRESSINGS) ×8 IMPLANT
BNDG ELASTIC 6X5.8 VLCR STR LF (GAUZE/BANDAGES/DRESSINGS) ×5 IMPLANT
BNDG GAUZE ELAST 4 BULKY (GAUZE/BANDAGES/DRESSINGS) ×9 IMPLANT
CABLE SURGICAL S-101-97-12 (CABLE) ×5 IMPLANT
CANISTER SUCT 3000ML PPV (MISCELLANEOUS) ×5 IMPLANT
CANNULA MC2 2 STG 29/37 NON-V (CANNULA) ×4 IMPLANT
CANNULA MC2 TWO STAGE (CANNULA) ×5
CANNULA NON VENT 20FR 12 (CANNULA) ×5 IMPLANT
CATH ROBINSON RED A/P 18FR (CATHETERS) ×10 IMPLANT
CLIP RETRACTION 3.0MM CORONARY (MISCELLANEOUS) ×5 IMPLANT
CLIP VESOCCLUDE MED 24/CT (CLIP) IMPLANT
CLIP VESOCCLUDE SM WIDE 24/CT (CLIP) IMPLANT
CONN ST 1/2X1/2  BEN (MISCELLANEOUS) ×5
CONN ST 1/2X1/2 BEN (MISCELLANEOUS) ×4 IMPLANT
CONNECTOR BLAKE 2:1 CARIO BLK (MISCELLANEOUS) ×5 IMPLANT
CONTAINER PROTECT SURGISLUSH (MISCELLANEOUS) ×8 IMPLANT
DERMABOND ADVANCED (GAUZE/BANDAGES/DRESSINGS) ×2
DERMABOND ADVANCED .7 DNX12 (GAUZE/BANDAGES/DRESSINGS) ×6 IMPLANT
DRAIN CHANNEL 19F RND (DRAIN) ×15 IMPLANT
DRAIN CONNECTOR BLAKE 1:1 (MISCELLANEOUS) ×5 IMPLANT
DRAPE CARDIOVASCULAR INCISE (DRAPES) ×5
DRAPE INCISE IOBAN 66X45 STRL (DRAPES) IMPLANT
DRAPE SRG 135X102X78XABS (DRAPES) ×4 IMPLANT
DRAPE WARM FLUID 44X44 (DRAPES) ×5 IMPLANT
DRSG AQUACEL AG ADV 3.5X10 (GAUZE/BANDAGES/DRESSINGS) ×5 IMPLANT
DRSG COVADERM 4X14 (GAUZE/BANDAGES/DRESSINGS) ×5 IMPLANT
ELECT BLADE 4.0 EZ CLEAN MEGAD (MISCELLANEOUS) ×5
ELECT REM PT RETURN 9FT ADLT (ELECTROSURGICAL) ×10
ELECTRODE BLDE 4.0 EZ CLN MEGD (MISCELLANEOUS) ×4 IMPLANT
ELECTRODE REM PT RTRN 9FT ADLT (ELECTROSURGICAL) ×8 IMPLANT
FELT TEFLON 1X6 (MISCELLANEOUS) ×6 IMPLANT
GAUZE 4X4 16PLY ~~LOC~~+RFID DBL (SPONGE) ×3 IMPLANT
GAUZE SPONGE 4X4 12PLY STRL (GAUZE/BANDAGES/DRESSINGS) ×7 IMPLANT
GAUZE SPONGE 4X4 12PLY STRL LF (GAUZE/BANDAGES/DRESSINGS) ×8 IMPLANT
GLOVE SURG ENC MOIS LTX SZ7 (GLOVE) ×10 IMPLANT
GLOVE SURG ENC TEXT LTX SZ7.5 (GLOVE) ×10 IMPLANT
GLOVE SURG MICRO LTX SZ6 (GLOVE) ×18 IMPLANT
GOWN STRL REUS W/ TWL LRG LVL3 (GOWN DISPOSABLE) ×28 IMPLANT
GOWN STRL REUS W/ TWL XL LVL3 (GOWN DISPOSABLE) ×5 IMPLANT
GOWN STRL REUS W/TWL LRG LVL3 (GOWN DISPOSABLE) ×40
GOWN STRL REUS W/TWL XL LVL3 (GOWN DISPOSABLE) ×5
HEMOSTAT POWDER SURGIFOAM 1G (HEMOSTASIS) ×11 IMPLANT
INSERT SUTURE HOLDER (MISCELLANEOUS) ×8 IMPLANT
KIT BASIN OR (CUSTOM PROCEDURE TRAY) ×5 IMPLANT
KIT SUCTION CATH 14FR (SUCTIONS) ×5 IMPLANT
KIT TURNOVER KIT B (KITS) ×5 IMPLANT
KIT VASOVIEW HEMOPRO 2 VH 4000 (KITS) ×8 IMPLANT
LEAD PACING MYOCARDI (MISCELLANEOUS) ×8 IMPLANT
MARKER GRAFT CORONARY BYPASS (MISCELLANEOUS) ×15 IMPLANT
NS IRRIG 1000ML POUR BTL (IV SOLUTION) ×25 IMPLANT
PACK ACCESSORY CANNULA KIT (KITS) ×5 IMPLANT
PACK E OPEN HEART (SUTURE) ×5 IMPLANT
PACK OPEN HEART (CUSTOM PROCEDURE TRAY) ×5 IMPLANT
PAD ARMBOARD 7.5X6 YLW CONV (MISCELLANEOUS) ×10 IMPLANT
PAD ELECT DEFIB RADIOL ZOLL (MISCELLANEOUS) ×5 IMPLANT
PENCIL BUTTON HOLSTER BLD 10FT (ELECTRODE) ×5 IMPLANT
POSITIONER HEAD DONUT 9IN (MISCELLANEOUS) ×5 IMPLANT
PUNCH AORTIC ROTATE 4.0MM (MISCELLANEOUS) ×8 IMPLANT
SET MPS 3-ND DEL (MISCELLANEOUS) ×4 IMPLANT
SPONGE T-LAP 18X18 ~~LOC~~+RFID (SPONGE) ×20 IMPLANT
SPONGE T-LAP 4X18 ~~LOC~~+RFID (SPONGE) ×3 IMPLANT
SUPPORT HEART JANKE-BARRON (MISCELLANEOUS) ×5 IMPLANT
SUT BONE WAX W31G (SUTURE) ×5 IMPLANT
SUT ETHIBOND X763 2 0 SH 1 (SUTURE) ×10 IMPLANT
SUT MNCRL AB 3-0 PS2 18 (SUTURE) ×10 IMPLANT
SUT MNCRL AB 4-0 PS2 18 (SUTURE) ×4 IMPLANT
SUT PDS AB 1 CTX 36 (SUTURE) ×10 IMPLANT
SUT PROLENE 4 0 SH DA (SUTURE) ×5 IMPLANT
SUT PROLENE 5 0 C 1 36 (SUTURE) ×18 IMPLANT
SUT PROLENE 7 0 BV 1 (SUTURE) ×18 IMPLANT
SUT PROLENE 7 0 BV1 MDA (SUTURE) ×5 IMPLANT
SUT STEEL 6MS V (SUTURE) ×10 IMPLANT
SUT STEEL STERNAL CCS#1 18IN (SUTURE) ×6 IMPLANT
SUT VIC AB 2-0 CT1 27 (SUTURE) ×5
SUT VIC AB 2-0 CT1 TAPERPNT 27 (SUTURE) ×2 IMPLANT
SYSTEM SAHARA CHEST DRAIN ATS (WOUND CARE) ×5 IMPLANT
TAPE CLOTH 4X10 WHT NS (GAUZE/BANDAGES/DRESSINGS) ×3 IMPLANT
TAPE PAPER 2X10 WHT MICROPORE (GAUZE/BANDAGES/DRESSINGS) ×3 IMPLANT
TOWEL GREEN STERILE (TOWEL DISPOSABLE) ×5 IMPLANT
TOWEL GREEN STERILE FF (TOWEL DISPOSABLE) ×5 IMPLANT
TRAY FOLEY SLVR 16FR TEMP STAT (SET/KITS/TRAYS/PACK) ×5 IMPLANT
TUBING LAP HI FLOW INSUFFLATIO (TUBING) ×5 IMPLANT
UNDERPAD 30X36 HEAVY ABSORB (UNDERPADS AND DIAPERS) ×5 IMPLANT
WATER STERILE IRR 1000ML POUR (IV SOLUTION) ×10 IMPLANT

## 2021-01-25 NOTE — Transfer of Care (Signed)
Immediate Anesthesia Transfer of Care Note  Patient: Paul [name]  Procedure(s) Performed: CORONARY ARTERY BYPASS GRAFTING (CABG) X2, USING LEFT INTERNAL MAMMARY ARTERY AND LEFT LEG GREATER SAPHENOUS VEIN HARVESTED ENDOSCOPICALLY (Chest) TRANSESOPHAGEAL ECHOCARDIOGRAM (TEE) ENDOVEIN HARVEST OF GREATER SAPHENOUS VEIN (Bilateral) APPLICATION OF CELL SAVER  Patient Location: SICU  Anesthesia Type:General  Level of Consciousness: Patient remains intubated per anesthesia plan  Airway & Oxygen Therapy: Patient remains intubated per anesthesia plan and Patient placed on Ventilator (see vital sign flow sheet for setting)  Post-op Assessment: Report given to RN and Post -op Vital signs reviewed and stable  Post vital signs: Reviewed and stable  Last Vitals:  Vitals Value Taken Time  BP 114/56   Temp    Pulse 83 01/25/21 1229  Resp 18 01/25/21 1229  SpO2 88 % 01/25/21 1229  Vitals shown include unvalidated device data.  Last Pain:  Vitals:   01/25/21 0613  TempSrc:   PainSc: 0-No pain      Patients Stated Pain Goal: 0 (76/19/50 [id number])  Complications: No notable events documented.

## 2021-01-25 NOTE — Discharge Instructions (Signed)

## 2021-01-25 NOTE — Brief Op Note (Signed)
01/25/2021  10:41 AM  PATIENT:  Paul [name]  70 y.o. male  PRE-OPERATIVE DIAGNOSIS:  Coronary Artery Disease  POST-OPERATIVE DIAGNOSIS:  Coronary Artery Disease  PROCEDURE:  TRANSESOPHAGEAL ECHOCARDIOGRAM (TEE), CORONARY ARTERY BYPASS GRAFTING (CABG) X2 (LIMA to LAD, SVG to OM) USING LEFT INTERNAL MAMMARY ARTERY AND LEFT THIGH GREATER SAPHENOUS VEIN HARVESTED ENDOSCOPICALLY  RIGHT and LEFT EVH HARVEST TIME: 29 minutes;RIGHT and LEFT EVH PREP TIME:14 minutes  SURGEON:  Surgeon(s) and Role:    Lightfoot, Lucile Crater, MD - Primary  PHYSICIAN ASSISTANT: Lars Pinks PA-C  ASSISTANTS: Dineen Kid RNFA   ANESTHESIA:   general  EBL: Per anesthesia, perfusion record  DRAINS:  Chest tubes placed in the mediastinal and pleural spaces    COUNTS CORRECT:  YES  DICTATION: .Dragon Dictation  PLAN OF CARE: Admit to inpatient   PATIENT DISPOSITION:  ICU - intubated and hemodynamically stable.   Delay start of Pharmacological VTE agent (>24hrs) due to surgical blood loss or risk of bleeding: yes  BASELINE WEIGHT: 86.2 kg

## 2021-01-25 NOTE — Anesthesia Procedure Notes (Signed)
Anesthesia Procedure Image    

## 2021-01-25 NOTE — Anesthesia Procedure Notes (Signed)
Procedure Name: Intubation Date/Time: 01/25/2021 7:43 AM Performed by: Wilburn Cornelia, CRNA Pre-anesthesia Checklist: Emergency Drugs available, Patient identified, Suction available, Patient being monitored and Timeout performed Patient Re-evaluated:Patient Re-evaluated prior to induction Oxygen Delivery Method: Circle system utilized Preoxygenation: Pre-oxygenation with 100% oxygen Induction Type: IV induction Ventilation: Mask ventilation with difficulty and Oral airway inserted - appropriate to patient size Laryngoscope Size: Mac and 4 Grade View: Grade I Tube type: Oral Tube size: 8.0 mm Number of attempts: 1 Airway Equipment and Method: Stylet Placement Confirmation: ETT inserted through vocal cords under direct vision, positive ETCO2, CO2 detector and breath sounds checked- equal and bilateral Secured at: 23 cm Tube secured with: Tape Dental Injury: Teeth and Oropharynx as per pre-operative assessment

## 2021-01-25 NOTE — Progress Notes (Signed)
Patient switched to PS/CPAP 5/5 40%.

## 2021-01-25 NOTE — Anesthesia Preprocedure Evaluation (Signed)
Anesthesia Evaluation  Patient identified by MRN, date of birth, ID band Patient awake    Reviewed: Allergy & Precautions, NPO status , Patient's Chart, lab work & pertinent test results  Airway Mallampati: II  TM Distance: >3 FB Neck ROM: Limited    Dental no notable dental hx.    Pulmonary sleep apnea and Oxygen sleep apnea , COPD, former smoker,    Pulmonary exam normal breath sounds clear to auscultation       Cardiovascular hypertension, + angina + CAD and + Cardiac Stents  Normal cardiovascular exam Rhythm:Regular Rate:Normal  Left Ventricle: Left ventricular ejection fraction, by estimation, is 60  to 65%. The left ventricle has normal function. The left ventricle has no  regional wall motion abnormalities. The left ventricular internal cavity  size was normal in size. There is  no left ventricular hypertrophy. Left ventricular diastolic parameters  are consistent with Grade I diastolic dysfunction (impaired relaxation).  Normal left ventricular filling pressure.   Right Ventricle: The right ventricular size is normal. No increase in  right ventricular wall thickness. Right ventricular systolic function is  normal.   Left Atrium: Left atrial size was normal in size.   Right Atrium: Right atrial size was normal in size.   Pericardium: There is no evidence of pericardial effusion.   Mitral Valve: The mitral valve is normal in structure. No evidence of  mitral valve regurgitation. No evidence of mitral valve stenosis.   Tricuspid Valve: The tricuspid valve is normal in structure. Tricuspid  valve regurgitation is not demonstrated. No evidence of tricuspid  stenosis.   Aortic Valve: The aortic valve is normal in structure. Aortic valve  regurgitation is not visualized. No aortic stenosis is present.   Pulmonic Valve: The pulmonic valve was normal in structure. Pulmonic valve  regurgitation is not visualized. No  evidence of pulmonic stenosis.   Aorta: The aortic root is normal in size and structure.   Venous: The inferior vena cava is normal in size with greater than 50%  respiratory variability, suggesting right atrial pressure of 3 mmHg.   IAS/Shunts: No atrial level shunt detected by color flow Doppler.     LEFT VENTRICLE  PLAX 2D  LVIDd:     4.00 cm Diastology  LVIDs:     2.60 cm LV e' medial:  6.64 cm/s  LV PW:     1.00 cm LV E/e' medial: 8.9  LV IVS:    1.00 cm LV e' lateral:  12.00 cm/s  LVOT diam:   1.80 cm LV E/e' lateral: 4.9  LV SV:     45  LV SV Index:  23  LVOT Area:   2.54 cm     RIGHT VENTRICLE  RV S prime:   13.30 cm/s  TAPSE (M-mode): 2.1 cm   LEFT ATRIUM       Index    RIGHT ATRIUM      Index  LA diam:    2.70 cm 1.42 cm/m RA Area:   10.50 cm  LA Vol (A2C):  23.5 ml 12.37 ml/m RA Volume:  22.40 ml 11.79 ml/m  LA Vol (A4C):  27.0 ml 14.21 ml/m  LA Biplane Vol: 25.6 ml 13.47 ml/m  AORTIC VALVE  LVOT Vmax:  85.50 cm/s  LVOT Vmean: 68.800 cm/s  LVOT VTI:  0.175 m    AORTA  Ao Root diam: 3.10 cm    Neuro/Psych negative neurological ROS  negative psych ROS   GI/Hepatic negative GI ROS, Neg liver ROS,  Endo/Other  diabetes, Type 2  Renal/GU negative Renal ROS  negative genitourinary   Musculoskeletal negative musculoskeletal ROS (+)   Abdominal   Peds negative pediatric ROS (+)  Hematology negative hematology ROS (+)   Anesthesia Other Findings   Reproductive/Obstetrics negative OB ROS                             Anesthesia Physical Anesthesia Plan  ASA: 3  Anesthesia Plan: General   Post-op Pain Management:    Induction: Intravenous  PONV Risk Score and Plan: 2 and Ondansetron, Dexamethasone and Treatment may vary due to age or medical condition  Airway Management Planned: Oral ETT  Additional Equipment: Arterial line, CVP, PA Cath,  TEE and Ultrasound Guidance Line Placement  Intra-op Plan:   Post-operative Plan: Post-operative intubation/ventilation  Informed Consent: I have reviewed the patients History and Physical, chart, labs and discussed the procedure including the risks, benefits and alternatives for the proposed anesthesia with the patient or authorized representative who has indicated his/her understanding and acceptance.     Dental advisory given  Plan Discussed with: CRNA and Surgeon  Anesthesia Plan Comments:         Anesthesia Quick Evaluation

## 2021-01-25 NOTE — Anesthesia Procedure Notes (Signed)
Arterial Line Insertion Start/End10/02/2021 7:00 AM, 01/25/2021 7:10 AM Performed by: Wilburn Cornelia, CRNA, CRNA  Patient location: Pre-op. Preanesthetic checklist: patient identified, IV checked, site marked, risks and benefits discussed, surgical consent, monitors and equipment checked, pre-op evaluation, timeout performed and anesthesia consent Lidocaine 1% used for infiltration Left, radial was placed Catheter size: 20 G Hand hygiene performed  and maximum sterile barriers used  Allen's test indicative of satisfactory collateral circulation Attempts: 3 Procedure performed without using ultrasound guided technique. Following insertion, dressing applied and Biopatch. Post procedure assessment: normal and unchanged

## 2021-01-25 NOTE — Op Note (Signed)
[address]       [place] [address]             [phone]                                          01/25/2021 Patient:  [name] Pre-Op Dx: 2 vessel CAD HTN DM Obesity   Post-op Dx:  same Procedure: CABG X LIMA LAD (free graft, jumped off the vein graft proximally), RSVG OM   Endoscopic greater saphenous vein harvest on the right and left   Surgeon and Role:      * Nikoleta Dady, Lucile Crater, MD - Primary    Josie Saunders, PA-C - assisting  Anesthesia  general EBL:  750 ml Blood Administration: none Xclamp Time:  26 min Pump Time:  21min  Drains: 54 F blake drain: L, mediastinal  Wires: ventricular Counts: correct   Indications: This is a 70 year old gentleman with a history of coronary artery disease who presents with significant stenosis in his LAD and circumflex vessels.  He has mild disease in his right coronary artery.  His echocardiogram does not show any significant valvular disease and preserved biventricular function.  Will review of the imaging I do not think that he has a good target in his diagonal branch that is already stented, but his LAD and obtuse marginal will be good targets.  His diabetes is poorly controlled with a hemoglobin A1c of 9.1 at the beginning of this month.  We discussed the risks and benefits of surgical revascularization and he is agreeable to proceed.  He does live by himself, but states that he has good family and friend support.  Findings: Poor flow in the mammary.  I decided to resect it at the level of the insertion into the subclavian.  I was able to probe distally, and inject papaverine without obstruction.  The proximal end was jumped off the hood of the vein graft.  Good LIMA.  Good OM.  The right vein was taken, but not usable.  The left greater saphenous vein was then taken, and adequate for bypass  Junctional coming off pump, thus pacing wires were placed   Operative Technique: All invasive lines were  placed in pre-op holding.  After the risks, benefits and alternatives were thoroughly discussed, the patient was brought to the operative theatre.  Anesthesia was induced, and the patient was prepped and draped in normal sterile fashion.  An appropriate surgical pause was performed, and pre-operative antibiotics were dosed accordingly.  We began with simultaneous incisions along the right leg for harvesting of the greater saphenous vein and the chest for the sternotomy.  In regards to the sternotomy, this was carried down with bovie cautery, and the sternum was divided with a reciprocating saw.  Meticulous hemostasis was obtained.  The left internal thoracic artery was exposed and harvested in in pedicled fashion.  The patient was systemically heparinized, and the artery was divided distally, and placed in a papaverine sponge.    The sternal elevator was removed, and a retractor was placed.  The pericardium was divided in the midline and fashioned into a cradle with pericardial stitches.   After we confirmed an appropriate ACT, the ascending aorta was cannulated in standard fashion.  The right atrial appendage was used for venous cannulation site.  Cardiopulmonary bypass was initiated, and the heart  retractor was placed. The cross clamp was applied, and a dose of anterograde cardioplegia was given with good arrest of the heart.   Next we exposed the lateral wall, and found a good target on the OM.  An end to side anastomosis with the vein graft was then created.  Finally, we exposed a good target on the LAD, and fashioned an end to side anastomosis between it and the LITA.  We began to re-warm, and a re-animation dose of cardioplegia was given.  The heart was de-aired, and the cross clamp was removed.  Meticulous hemostasis was obtained.    A partial occludding clamp was then placed on the ascending aorta, and we created an end to side anastomosis between it and the proximal vein graft.  The proximal site was  marked with a ring.  The LIMA was then jumped off the hood of the vein graft.  Hemostasis was obtained, and we separated from cardiopulmonary bypass without event.  The heparin was reversed with protamine.  Chest tubes and wires were placed, and the sternum was re-approximated with sternal wires.  The soft tissue and skin were re-approximated wth absorbable suture.    The patient tolerated the procedure without any immediate complications, and was transferred to the ICU in guarded condition.  Malina Geers Bary Leriche

## 2021-01-25 NOTE — Interval H&P Note (Signed)
History and Physical Interval Note:  01/25/2021 7:01 AM  Paul [name]  has presented today for surgery, with the diagnosis of CAD.  The various methods of treatment have been discussed with the patient and family. After consideration of risks, benefits and other options for treatment, the patient has consented to  Procedure(s): CORONARY ARTERY BYPASS GRAFTING (CABG) (N/A) TRANSESOPHAGEAL ECHOCARDIOGRAM (TEE) (N/A) as a surgical intervention.  The patient's history has been reviewed, patient examined, no change in status, stable for surgery.  I have reviewed the patient's chart and labs.  Questions were answered to the patient's satisfaction.     Saniyya Gau Bary Leriche

## 2021-01-25 NOTE — Hospital Course (Addendum)
HPI: This is a 70 year old gentleman with a history of coronary artery disease underwent elective left heart cath which showed in-stent restenosis in his diagonal stent, and LAD and circumflex disease.  For the past several months, he has had some exertional dyspnea and anginal symptoms.  He has also had some lethargy. His diabetes is poorly controlled with a hemoglobin A1c of 9.1 at the beginning of this month.   Dr. Lowella Dell discussed the need for coronary artery bypass grafting surgery. Potential risks, benefits, and complications of the surgery were discussed with the patient and he agreed to proceed with surgery. Pre operative carotid duplex US showed no significant internal carotid artery stenosis bilaterally.   Hospital Course: Patient underwent a CABG x 2 on 01/25/2021. He was transferred from the OR to [facility] ICU in stable condition. He was weaned off Levophed drip. Gordy Councilman, a line were removed on post op day one. Chest tubes remained and as output decreased were removed on 10/13 as well as the foley. Patient was restarted on Plavix and ec asa was decreased to 81 mg daily. He had expected post op blood loss anemia. He did not require a post op transfusion. H and H on 10/13 was 8.2 and 26.4. He had thrombocytopenia. His platelet count went as low as 94,000. He was weaned off the Insulin drip. His pre op HGA1C was 8.8. He will be restarted on Metformin closer/at discharge. He will need close follow up with his medical doctor. He was volume overloaded and diuresed.He has known COPD and is responding well to routine pulmonary hygiene measures as well as nebulizers.  He has had some difficulty with voiding and requiring intermittent catheterization.  He has been started on Flomax.  This is being monitored closely.  Does have a postoperative thrombocytopenia is which is also being monitored clinically and over time.  Blood sugars have been under reasonable control and he will be gradually transition to his  home medication regimen.  He has been evaluated by physical, occupational and speech therapy and recommendations and originally he was recommended for CIR.  Subsequent to this he has been felt more appropriate as a candidate for skilled nursing facility.  This is primarily because he does not have caregiver support at home after a CIR admission.

## 2021-01-25 NOTE — Progress Notes (Signed)
  Echocardiogram Echocardiogram Transesophageal has been performed.  Darlina Sicilian M 01/25/2021, 7:59 AM

## 2021-01-25 NOTE — Anesthesia Procedure Notes (Signed)
Central Venous Catheter Insertion Performed by: Myrtie Soman, MD, anesthesiologist Start/End10/02/2021 6:50 AM, 01/25/2021 7:15 AM Patient location: Pre-op. Preanesthetic checklist: patient identified, IV checked, site marked, risks and benefits discussed, surgical consent, monitors and equipment checked, pre-op evaluation, timeout performed and anesthesia consent Position: Trendelenburg Lidocaine 1% used for infiltration and patient sedated Hand hygiene performed  and maximum sterile barriers used  Catheter size: 8.5 Fr Sheath introducer Procedure performed using ultrasound guided technique. Ultrasound Notes:anatomy identified, needle tip was noted to be adjacent to the nerve/plexus identified, no ultrasound evidence of intravascular and/or intraneural injection and image(s) printed for medical record Attempts: 1 Following insertion, line sutured, dressing applied and Biopatch. Post procedure assessment: blood return through all ports, free fluid flow and no air  Patient tolerated the procedure well with no immediate complications. Additional procedure comments: Flo-Trac used.

## 2021-01-25 NOTE — Procedures (Signed)
Extubation Procedure Note  Patient Details:   Name: Paul [name] DOB: August 04, 1950 MRN: [id number]   Airway Documentation:    Vent end date: (not recorded) Vent end time: (not recorded)   Evaluation  O2 sats: stable throughout Complications: No apparent complications Patient did tolerate procedure well. Bilateral Breath Sounds: Rhonchi   Yes, patient able to vocalize.  Patient extubated per rapid wean protocol. NIF -20. VC 1.0L. Placed on 4L nasal cannula. Vitals stable. RN at bedside.   Seward Speck 01/25/2021, 3:29 PM

## 2021-01-25 NOTE — Progress Notes (Signed)
RT began rapid wean protocol. Patient placed on RR 4 and 40% FIO2 at 1420. RN at bedside.

## 2021-01-26 ENCOUNTER — Encounter (HOSPITAL_COMMUNITY): Payer: Self-pay | Admitting: Thoracic Surgery (Cardiothoracic Vascular Surgery)

## 2021-01-26 ENCOUNTER — Inpatient Hospital Stay (HOSPITAL_COMMUNITY): Payer: Medicare Other

## 2021-01-26 LAB — CBC
HCT: 28.7 % — ABNORMAL LOW (ref 39.0–52.0)
HCT: 27.3 % — ABNORMAL LOW (ref 39.0–52.0)
Hemoglobin: 9.3 g/dL — ABNORMAL LOW (ref 13.0–17.0)
Hemoglobin: 8.7 g/dL — ABNORMAL LOW (ref 13.0–17.0)
MCH: 29.4 pg (ref 26.0–34.0)
MCH: 29.5 pg (ref 26.0–34.0)
MCHC: 32.4 g/dL (ref 30.0–36.0)
MCHC: 31.9 g/dL (ref 30.0–36.0)
MCV: 90.8 fL (ref 80.0–100.0)
MCV: 92.5 fL (ref 80.0–100.0)
Platelets: 142 10*3/uL — ABNORMAL LOW (ref 150–400)
Platelets: 102 10*3/uL — ABNORMAL LOW (ref 150–400)
RBC: 3.16 MIL/uL — ABNORMAL LOW (ref 4.22–5.81)
RBC: 2.95 MIL/uL — ABNORMAL LOW (ref 4.22–5.81)
RDW: 14.1 % (ref 11.5–15.5)
RDW: 14.6 % (ref 11.5–15.5)
WBC: 17.1 10*3/uL — ABNORMAL HIGH (ref 4.0–10.5)
WBC: 10.3 10*3/uL (ref 4.0–10.5)
nRBC: 0 % (ref 0.0–0.2)
nRBC: 0 % (ref 0.0–0.2)

## 2021-01-26 LAB — GLUCOSE, CAPILLARY
Glucose-Capillary: 120 mg/dL — ABNORMAL HIGH (ref 70–99)
Glucose-Capillary: 121 mg/dL — ABNORMAL HIGH (ref 70–99)
Glucose-Capillary: 121 mg/dL — ABNORMAL HIGH (ref 70–99)
Glucose-Capillary: 123 mg/dL — ABNORMAL HIGH (ref 70–99)
Glucose-Capillary: 124 mg/dL — ABNORMAL HIGH (ref 70–99)
Glucose-Capillary: 137 mg/dL — ABNORMAL HIGH (ref 70–99)
Glucose-Capillary: 149 mg/dL — ABNORMAL HIGH (ref 70–99)
Glucose-Capillary: 173 mg/dL — ABNORMAL HIGH (ref 70–99)
Glucose-Capillary: 179 mg/dL — ABNORMAL HIGH (ref 70–99)
Glucose-Capillary: 179 mg/dL — ABNORMAL HIGH (ref 70–99)
Glucose-Capillary: 184 mg/dL — ABNORMAL HIGH (ref 70–99)
Glucose-Capillary: 185 mg/dL — ABNORMAL HIGH (ref 70–99)
Glucose-Capillary: 188 mg/dL — ABNORMAL HIGH (ref 70–99)
Glucose-Capillary: 193 mg/dL — ABNORMAL HIGH (ref 70–99)

## 2021-01-26 LAB — BASIC METABOLIC PANEL
Anion gap: 9 (ref 5–15)
Anion gap: 6 (ref 5–15)
BUN: 12 mg/dL (ref 8–23)
BUN: 13 mg/dL (ref 8–23)
CO2: 20 mmol/L — ABNORMAL LOW (ref 22–32)
CO2: 22 mmol/L (ref 22–32)
Calcium: 7.8 mg/dL — ABNORMAL LOW (ref 8.9–10.3)
Calcium: 7.7 mg/dL — ABNORMAL LOW (ref 8.9–10.3)
Chloride: 107 mmol/L (ref 98–111)
Chloride: 105 mmol/L (ref 98–111)
Creatinine, Ser: 0.93 mg/dL (ref 0.61–1.24)
Creatinine, Ser: 1.14 mg/dL (ref 0.61–1.24)
GFR, Estimated: >60 mL/min (ref >60)
GFR, Estimated: >60 mL/min (ref >60)
Glucose, Bld: 122 mg/dL — ABNORMAL HIGH (ref 70–99)
Glucose, Bld: 179 mg/dL — ABNORMAL HIGH (ref 70–99)
Potassium: 3.9 mmol/L (ref 3.5–5.1)
Potassium: 4.2 mmol/L (ref 3.5–5.1)
Sodium: 136 mmol/L (ref 135–145)
Sodium: 133 mmol/L — ABNORMAL LOW (ref 135–145)

## 2021-01-26 LAB — MAGNESIUM
Magnesium: 2 mg/dL (ref 1.7–2.4)
Magnesium: 2.1 mg/dL (ref 1.7–2.4)

## 2021-01-26 MED ORDER — INSULIN DETEMIR 100 UNIT/ML ~~LOC~~ SOLN
15.0000 [IU] | Freq: Two times a day (BID) | SUBCUTANEOUS | Status: DC
  Administered 2021-01-26 – 2021-02-02 (×15): 15 [IU] via SUBCUTANEOUS
  Filled 2021-01-26 (×17): qty 0.15

## 2021-01-26 MED ORDER — INSULIN ASPART 100 UNIT/ML IJ SOLN
0.0000 [IU] | INTRAMUSCULAR | Status: DC
  Administered 2021-01-26 (×3): 4 [IU] via SUBCUTANEOUS
  Administered 2021-01-26: 2 [IU] via SUBCUTANEOUS
  Administered 2021-01-27 (×2): 4 [IU] via SUBCUTANEOUS
  Administered 2021-01-27: 12 [IU] via SUBCUTANEOUS
  Administered 2021-01-27 (×3): 4 [IU] via SUBCUTANEOUS
  Administered 2021-01-28: 8 [IU] via SUBCUTANEOUS
  Administered 2021-01-28: 2 [IU] via SUBCUTANEOUS
  Administered 2021-01-28 – 2021-01-29 (×4): 4 [IU] via SUBCUTANEOUS
  Administered 2021-01-29 – 2021-01-30 (×5): 2 [IU] via SUBCUTANEOUS
  Administered 2021-01-30: 4 [IU] via SUBCUTANEOUS
  Administered 2021-01-30: 2 [IU] via SUBCUTANEOUS
  Administered 2021-01-30 – 2021-01-31 (×2): 4 [IU] via SUBCUTANEOUS
  Administered 2021-01-31: 2 [IU] via SUBCUTANEOUS
  Administered 2021-01-31: 4 [IU] via SUBCUTANEOUS
  Administered 2021-01-31: 2 [IU] via SUBCUTANEOUS
  Administered 2021-02-01: 8 [IU] via SUBCUTANEOUS
  Administered 2021-02-01: 4 [IU] via SUBCUTANEOUS
  Administered 2021-02-01: 2 [IU] via SUBCUTANEOUS

## 2021-01-26 MED ORDER — CLOPIDOGREL BISULFATE 75 MG PO TABS
75.0000 mg | ORAL_TABLET | Freq: Every day | ORAL | Status: DC
  Administered 2021-01-26 – 2021-02-02 (×8): 75 mg via ORAL
  Filled 2021-01-26 (×8): qty 1

## 2021-01-26 MED ORDER — DIPHENHYDRAMINE HCL 25 MG PO CAPS
25.0000 mg | ORAL_CAPSULE | Freq: Four times a day (QID) | ORAL | Status: DC | PRN
  Administered 2021-01-26 – 2021-01-28 (×3): 25 mg via ORAL
  Filled 2021-01-26 (×3): qty 1

## 2021-01-26 MED ORDER — ASPIRIN EC 81 MG PO TBEC
81.0000 mg | DELAYED_RELEASE_TABLET | Freq: Every day | ORAL | Status: DC
  Administered 2021-01-26 – 2021-02-02 (×8): 81 mg via ORAL
  Filled 2021-01-26 (×8): qty 1

## 2021-01-26 MED ORDER — ACETAMINOPHEN 500 MG PO TABS
1000.0000 mg | ORAL_TABLET | Freq: Four times a day (QID) | ORAL | Status: AC
Start: 2021-01-27 — End: 2021-01-31
  Administered 2021-01-27 – 2021-01-31 (×16): 1000 mg via ORAL
  Filled 2021-01-26 (×15): qty 2

## 2021-01-26 MED ORDER — ACETAMINOPHEN 10 MG/ML IV SOLN
1000.0000 mg | Freq: Four times a day (QID) | INTRAVENOUS | Status: AC
  Administered 2021-01-26 – 2021-01-27 (×4): 1000 mg via INTRAVENOUS
  Filled 2021-01-26 (×4): qty 100

## 2021-01-26 MED FILL — Potassium Chloride Inj 2 mEq/ML: INTRAVENOUS | Qty: 40 | Status: AC

## 2021-01-26 MED FILL — Heparin Sodium (Porcine) Inj 1000 Unit/ML: Qty: 1000 | Status: AC

## 2021-01-26 MED FILL — Lidocaine HCl Local Preservative Free (PF) Inj 2%: INTRAMUSCULAR | Qty: 15 | Status: AC

## 2021-01-26 NOTE — Anesthesia Postprocedure Evaluation (Signed)
Anesthesia Post Note  Patient: Paul [name]  Procedure(s) Performed: CORONARY ARTERY BYPASS GRAFTING (CABG) X2, USING LEFT INTERNAL MAMMARY ARTERY AND LEFT LEG GREATER SAPHENOUS VEIN HARVESTED ENDOSCOPICALLY (Chest) TRANSESOPHAGEAL ECHOCARDIOGRAM (TEE) ENDOVEIN HARVEST OF GREATER SAPHENOUS VEIN (Bilateral) APPLICATION OF CELL SAVER     Patient location during evaluation: SICU Anesthesia Type: General Level of consciousness: sedated Pain management: pain level controlled Vital Signs Assessment: post-procedure vital signs reviewed and stable Respiratory status: patient remains intubated per anesthesia plan Cardiovascular status: stable Postop Assessment: no apparent nausea or vomiting Anesthetic complications: no   No notable events documented.  Last Vitals:  Vitals:   01/26/21 0600 01/26/21 0615  BP: (!) 135/56   Pulse: 87 86  Resp: 20 (!) 25  Temp: 37.8 C 37.7 C  SpO2: 91% 95%    Last Pain:  Vitals:   01/26/21 0508  TempSrc:   PainSc: Asleep                 Rayna Brenner S

## 2021-01-26 NOTE — Progress Notes (Signed)
Patient ID: Paul [name], male   DOB: [birth year], 70 y.o.   MRN: [id number] [facility] Evening Rounds:  Hemodynamically stable in sinus rhythm. NE weaned off today.  Sats 94% on 5L HFNC  UO 20-25/hr. BMET pending this pm.  CBC    Component Value Date/Time   WBC 10.3 01/26/2021 1652   RBC 2.95 (L) 01/26/2021 1652   HGB 8.7 (L) 01/26/2021 1652   HGB 12.9 (L) 12/21/2020 0824   HCT 27.3 (L) 01/26/2021 1652   HCT 38.6 12/21/2020 0824   PLT 102 (L) 01/26/2021 1652   PLT 148 (L) 12/21/2020 0824   MCV 92.5 01/26/2021 1652   MCV 86 12/21/2020 0824   MCH 29.5 01/26/2021 1652   MCHC 31.9 01/26/2021 1652   RDW 14.6 01/26/2021 1652   RDW 13.5 12/21/2020 0824   LYMPHSABS 2.2 01/09/2021 1809   LYMPHSABS 2.0 12/21/2020 0824   MONOABS 0.7 01/09/2021 1809   EOSABS 0.2 01/09/2021 1809   EOSABS 0.2 12/21/2020 0824   BASOSABS 0.1 01/09/2021 1809   BASOSABS 0.1 12/21/2020 0824   Chest tube output low.

## 2021-01-26 NOTE — Discharge Summary (Signed)
Physician Discharge Summary       [address]       [place] [address]             [phone]    Patient ID: Paul [name] MRN: [id number] DOB/AGE: [birth year] 70 y.o.  Admit date: 01/25/2021 Discharge date: 02/02/2021  Admission Diagnoses: Coronary artery disease  Discharge Diagnoses:  1.  S/P CABG x 2 2. Expected post op blood loss anemia 3. History of hypertension 4. History of diabetes mellitus 5. History of obesity 6. History of OSA (obstructive sleep apnea), uses oxygen at home did not tolerate cpap 7. History of COPD (chronic obstructive pulmonary disease) ([place]) 8. History of gout 9. History of tobacco abuse  Consults: None  Procedure (s):  CABG X 2 LIMA LAD (free graft, jumped off the vein graft proximally), RSVG OM   Endoscopic greater saphenous vein harvest on the right and left History of Presenting Illness: This is a 70 year old gentleman with a history of coronary artery disease underwent elective left heart cath which showed in-stent restenosis in his diagonal stent, and LAD and circumflex disease.  For the past several months, he has had some exertional dyspnea and anginal symptoms.  He has also had some lethargy. His diabetes is poorly controlled with a hemoglobin A1c of 9.1 at the beginning of this month.   Dr. Lowella Dell discussed the need for coronary artery bypass grafting surgery. Potential risks, benefits, and complications of the surgery were discussed with the patient and he agreed to proceed with surgery. Pre operative carotid duplex US showed no significant internal carotid artery stenosis bilaterally.   Brief Hospital Course:   Patient underwent a CABG x 2 on 01/25/2021. He was transferred from the OR to [facility] ICU in stable condition. He was weaned off Levophed drip. Gordy Councilman, a line were removed on post op day one. Chest tubes remained and as output decreased were removed on 10/13 as well as the foley. Patient was restarted on Plavix and  ec asa was decreased to 81 mg daily. He had expected post op blood loss anemia. He did not require a post op transfusion. H and H on 10/13 was 8.2 and 26.4. He had thrombocytopenia. His platelet count went as low as 94,000. He was weaned off the Insulin drip. His pre op HGA1C was 8.8. He will be restarted on Metformin closer/at discharge. He will need close follow up with his medical doctor. He was volume overloaded and diuresed.He has known COPD and is responding well to routine pulmonary hygiene measures as well as nebulizers.  He has had some difficulty with voiding and requiring intermittent catheterization.  He has been started on Flomax.  This is being monitored closely.  Does have a postoperative thrombocytopenia is which is also being monitored clinically and over time.  Blood sugars have been under reasonable control and he will be gradually transition to his home medication regimen.  He has been evaluated by physical, occupational and speech therapy and recommendations are for him to spend some time in CIR for ongoing rehab prior to discharge home.  On 01/30/2021 he did have an event where he fell out of his chair after a coughing episode.  He sustained a small laceration to his nose but otherwise has remained neurologically intact.  Head CT scan was negative for intracranial injury.  He has been recovering well and making slow and steady progress. He is medically stable for discharge today to a SNF.    Vitals with  BMI 02/02/2021 02/02/2021 02/02/2021  Height - - -  Weight - 184 lbs 3 oz -  BMI - 78.29 -  Systolic 562 - 130  Diastolic 62 - 63  Pulse 89 - 84     Physical Exam: General appearance: alert, cooperative, and no distress Heart: regular rate and rhythm Lungs: fair air exchange, no wheezing Abdomen: mod distension, stable exam Extremities: trace edema, redness or tenderness in the calves or thighs Wound: incis healing well   Discharge Condition:Stable and discharged to  home.  Recent laboratory studies:  Lab Results  Component Value Date   WBC 7.1 01/29/2021   HGB 8.0 (L) 01/29/2021   HCT 25.5 (L) 01/29/2021   MCV 93.4 01/29/2021   PLT 127 (L) 01/29/2021   Lab Results  Component Value Date   NA 136 02/02/2021   K 4.3 02/02/2021   CL 100 02/02/2021   CO2 26 02/02/2021   CREATININE 1.31 (H) 02/02/2021   GLUCOSE 122 (H) 02/02/2021      Diagnostic Studies: DG Chest 2 View  Result Date: 01/29/2021 CLINICAL DATA:  CABG, follow-up. EXAM: CHEST - 2 VIEW COMPARISON:  Chest x-rays dated 01/27/2021 and 01/26/2021. FINDINGS: RIGHT IJ central line is been removed in the interval. Heart size and mediastinal contours are stable. Lungs are now clear. No pleural effusion or pneumothorax is seen. Median sternotomy wires appear intact and stable in alignment. IMPRESSION: No active cardiopulmonary disease. Lungs are now clear. Electronically Signed   By: Franki Cabot M.D.   On: 01/29/2021 08:33   DG Chest 2 View  Result Date: 01/24/2021 CLINICAL DATA:  Preop for heart surgery EXAM: CHEST - 2 VIEW COMPARISON:  Radiograph 01/09/2021 FINDINGS: The heart size and mediastinal contours are within normal limits.No focal airspace disease. No pleural effusion or pneumothorax.No acute osseous abnormality. Partially visualized cervical spine fusion hardware. Left glenohumeral osteoarthritis with bulky osteophyte formation. IMPRESSION: No evidence of acute cardiopulmonary disease. Electronically Signed   By: Maurine Simmering M.D.   On: 01/24/2021 08:43   CT HEAD WO CONTRAST (5MM)  Result Date: 01/30/2021 CLINICAL DATA:  Facial trauma EXAM: CT HEAD WITHOUT CONTRAST TECHNIQUE: Contiguous axial images were obtained from the base of the skull through the vertex without intravenous contrast. COMPARISON:  None. FINDINGS: Brain: No evidence of acute infarction, hemorrhage, hydrocephalus, extra-axial collection or mass lesion/mass effect. Mild for age patchy white matter hypodensities,  nonspecific but compatible with chronic microvascular ischemic disease. Mild for age generalized atrophy with ex vacuo ventricular dilation. Vascular: No hyperdense vessel identified. Calcific intracranial atherosclerosis. Skull: No acute fracture.  Central forehead scalp contusion. Sinuses/Orbits: Mild paranasal sinus mucosal thickening without visible air-fluid level. Unremarkable orbits. Other: No mastoid effusions. IMPRESSION: 1. No evidence of acute intracranial abnormality. 2. Central forehead scalp contusion without acute fracture. 3. Mild for age chronic microvascular disease and atrophy. Electronically Signed   By: Margaretha Sheffield M.D.   On: 01/30/2021 19:09   CT Angio Chest PE W and/or Wo Contrast  Addendum Date: 01/10/2021   ADDENDUM REPORT: 01/10/2021 16:02 ADDENDUM: Please note findings should state "prostate is unremarkable" in the reproductive section (and not "status post hysterectomy.no adnexal masses"). Electronically Signed   By: Iven Finn M.D.   On: 01/10/2021 16:02   Addendum Date: 01/09/2021   ADDENDUM REPORT: 01/09/2021 22:06 ADDENDUM: Markedly prominent caudate lobe with underlying lesion not excluded. Recommend right upper quadrant ultrasound further evaluation. These results will be called to the ordering clinician or representative by the Radiologist Assistant, and communication documented  in the PACS or [employer]. Electronically Signed   By: Iven Finn M.D.   On: 01/09/2021 22:06   Result Date: 01/10/2021 CLINICAL DATA:  Pt has been experiencing abdominal and shoulder pain to the R side. R lower quadrant pain radiating to flank area. Pt did have a cardiac cath about 5 days ago with entry through R radial. PE suspected, high prob Abdominal pain, acute, nonlocalized R sided lower abd pain EXAM: CT ANGIOGRAPHY CHEST CT ABDOMEN AND PELVIS WITH CONTRAST TECHNIQUE: Multidetector CT imaging of the chest was performed using the standard protocol during bolus  administration of intravenous contrast. Multiplanar CT image reconstructions and MIPs were obtained to evaluate the vascular anatomy. Multidetector CT imaging of the abdomen and pelvis was performed using the standard protocol during bolus administration of intravenous contrast. CONTRAST:  137mL OMNIPAQUE IOHEXOL 350 MG/ML SOLN COMPARISON:  None. FINDINGS: CTA CHEST FINDINGS Cardiovascular: Fairly satisfactory opacification of the pulmonary arteries to the segmental level. No evidence of central or segmental pulmonary embolism. Normal heart size. No pericardial effusion. At least moderate atherosclerotic plaque of the thoracic aorta. Four-vessel coronary artery calcifications. Mediastinum/Nodes: 1.2 cm subcarinal lymph node. 1 cm superior mediastinal lymph node (3:14). No enlarged hilar or axillary lymph nodes. Thyroid gland, trachea, and esophagus demonstrate no significant findings. Lungs/Pleura: Expiratory phase of respiration. Bilateral lower lobe subsegmental atelectasis. No focal consolidation. No pulmonary nodule. No pulmonary mass. No pleural effusion. No pneumothorax. Musculoskeletal: No chest wall abnormality. No suspicious lytic or blastic osseous lesions. No acute displaced fracture. Review of the MIP images confirms the above findings. CT ABDOMEN and PELVIS FINDINGS Hepatobiliary: Trace perihepatic fat stranding of unclear etiology along the inferior hepatic lobe. No focal liver abnormality. No gallstones, gallbladder wall thickening, or pericholecystic fluid. No biliary dilatation. Pancreas: No focal lesion. Normal pancreatic contour. No surrounding inflammatory changes. No main pancreatic ductal dilatation. Spleen: Enlarged spleen measuring up to 13.5 cm.  No focal lesion. Adrenals/Urinary Tract: No adrenal nodule bilaterally. Bilateral kidneys enhance symmetrically. No hydronephrosis. No hydroureter. The urinary bladder is unremarkable. Stomach/Bowel: Stomach is within normal limits. No evidence of  bowel wall thickening or dilatation. Scattered colonic diverticulosis most prominent in the sigmoid colon. Appendix appears normal. Vascular/Lymphatic: Question penetrating atheromatous ulcer versus focal dissection versus noncalcified atherosclerotic plaque of the infrarenal abdominal aorta (5:46 extending) for approximately 1.5 Cm in the craniocaudal dimension. No associated periaortic fat stranding. No abdominal aorta or iliac aneurysm. Severe atherosclerotic plaque of the aorta and its branches. No abdominal, pelvic, or inguinal lymphadenopathy. Reproductive: Status post hysterectomy. No adnexal masses. Other: No intraperitoneal free fluid. No intraperitoneal free gas. No organized fluid collection. Musculoskeletal: No abdominal wall hernia or abnormality. No suspicious lytic or blastic osseous lesions. No acute displaced fracture. Grade 1 anterolisthesis of L5 on S1 with bilateral interarticularis defects at the L5 level. Intervertebral disc space vacuum phenomenon and endplate sclerosis at this level. Review of the MIP images confirms the above findings. IMPRESSION: 1. No central or segmental pulmonary embolus. 2. Couple of enlarged nonspecific nodes. 3. Scattered colonic diverticulosis with no acute diverticulitis. Stool throughout the colon. 4. Borderline enlarged spleen. 5. Question penetrating atheromatous ulcer versus focal dissection versus large noncalcified atherosclerotic plaque of the infrarenal abdominal aorta. No associated periaortic fat stranding to suggest imminent rupture. No associated aneurysmal dilatation. Correlate with prior cross-sectional imaging. 6. Aortic Atherosclerosis (ICD10-I70.0). Electronically Signed: By: Iven Finn M.D. On: 01/09/2021 21:43   CT ABDOMEN PELVIS W CONTRAST  Addendum Date: 01/10/2021   ADDENDUM REPORT: 01/10/2021  16:02 ADDENDUM: Please note findings should state "prostate is unremarkable" in the reproductive section (and not "status post hysterectomy.no  adnexal masses"). Electronically Signed   By: Iven Finn M.D.   On: 01/10/2021 16:02   Addendum Date: 01/09/2021   ADDENDUM REPORT: 01/09/2021 22:06 ADDENDUM: Markedly prominent caudate lobe with underlying lesion not excluded. Recommend right upper quadrant ultrasound further evaluation. These results will be called to the ordering clinician or representative by the Radiologist Assistant, and communication documented in the PACS or [employer]. Electronically Signed   By: Iven Finn M.D.   On: 01/09/2021 22:06   Result Date: 01/10/2021 CLINICAL DATA:  Pt has been experiencing abdominal and shoulder pain to the R side. R lower quadrant pain radiating to flank area. Pt did have a cardiac cath about 5 days ago with entry through R radial. PE suspected, high prob Abdominal pain, acute, nonlocalized R sided lower abd pain EXAM: CT ANGIOGRAPHY CHEST CT ABDOMEN AND PELVIS WITH CONTRAST TECHNIQUE: Multidetector CT imaging of the chest was performed using the standard protocol during bolus administration of intravenous contrast. Multiplanar CT image reconstructions and MIPs were obtained to evaluate the vascular anatomy. Multidetector CT imaging of the abdomen and pelvis was performed using the standard protocol during bolus administration of intravenous contrast. CONTRAST:  131mL OMNIPAQUE IOHEXOL 350 MG/ML SOLN COMPARISON:  None. FINDINGS: CTA CHEST FINDINGS Cardiovascular: Fairly satisfactory opacification of the pulmonary arteries to the segmental level. No evidence of central or segmental pulmonary embolism. Normal heart size. No pericardial effusion. At least moderate atherosclerotic plaque of the thoracic aorta. Four-vessel coronary artery calcifications. Mediastinum/Nodes: 1.2 cm subcarinal lymph node. 1 cm superior mediastinal lymph node (3:14). No enlarged hilar or axillary lymph nodes. Thyroid gland, trachea, and esophagus demonstrate no significant findings. Lungs/Pleura: Expiratory phase of  respiration. Bilateral lower lobe subsegmental atelectasis. No focal consolidation. No pulmonary nodule. No pulmonary mass. No pleural effusion. No pneumothorax. Musculoskeletal: No chest wall abnormality. No suspicious lytic or blastic osseous lesions. No acute displaced fracture. Review of the MIP images confirms the above findings. CT ABDOMEN and PELVIS FINDINGS Hepatobiliary: Trace perihepatic fat stranding of unclear etiology along the inferior hepatic lobe. No focal liver abnormality. No gallstones, gallbladder wall thickening, or pericholecystic fluid. No biliary dilatation. Pancreas: No focal lesion. Normal pancreatic contour. No surrounding inflammatory changes. No main pancreatic ductal dilatation. Spleen: Enlarged spleen measuring up to 13.5 cm.  No focal lesion. Adrenals/Urinary Tract: No adrenal nodule bilaterally. Bilateral kidneys enhance symmetrically. No hydronephrosis. No hydroureter. The urinary bladder is unremarkable. Stomach/Bowel: Stomach is within normal limits. No evidence of bowel wall thickening or dilatation. Scattered colonic diverticulosis most prominent in the sigmoid colon. Appendix appears normal. Vascular/Lymphatic: Question penetrating atheromatous ulcer versus focal dissection versus noncalcified atherosclerotic plaque of the infrarenal abdominal aorta (5:46 extending) for approximately 1.5 Cm in the craniocaudal dimension. No associated periaortic fat stranding. No abdominal aorta or iliac aneurysm. Severe atherosclerotic plaque of the aorta and its branches. No abdominal, pelvic, or inguinal lymphadenopathy. Reproductive: Status post hysterectomy. No adnexal masses. Other: No intraperitoneal free fluid. No intraperitoneal free gas. No organized fluid collection. Musculoskeletal: No abdominal wall hernia or abnormality. No suspicious lytic or blastic osseous lesions. No acute displaced fracture. Grade 1 anterolisthesis of L5 on S1 with bilateral interarticularis defects at the L5  level. Intervertebral disc space vacuum phenomenon and endplate sclerosis at this level. Review of the MIP images confirms the above findings. IMPRESSION: 1. No central or segmental pulmonary embolus. 2. Couple of enlarged nonspecific nodes.  3. Scattered colonic diverticulosis with no acute diverticulitis. Stool throughout the colon. 4. Borderline enlarged spleen. 5. Question penetrating atheromatous ulcer versus focal dissection versus large noncalcified atherosclerotic plaque of the infrarenal abdominal aorta. No associated periaortic fat stranding to suggest imminent rupture. No associated aneurysmal dilatation. Correlate with prior cross-sectional imaging. 6. Aortic Atherosclerosis (ICD10-I70.0). Electronically Signed: By: Iven Finn M.D. On: 01/09/2021 21:43   DG Chest Port 1 View  Result Date: 01/27/2021 CLINICAL DATA:  Chest tube present, pneumothorax, open heart surgery EXAM: PORTABLE CHEST 1 VIEW COMPARISON:  01/26/2021 FINDINGS: Unchanged chest tube, probable mediastinal drain, and right IJ central line. No pneumothorax. Persistent left pleural effusion and bibasilar atelectasis/consolidation. Similar cardiomediastinal contours. IMPRESSION: No substantial change. No pneumothorax. Persistent layering left pleural effusion and bibasilar atelectasis/consolidation. Electronically Signed   By: Macy Mis M.D.   On: 01/27/2021 08:18   DG Chest Port 1 View  Result Date: 01/26/2021 CLINICAL DATA:  Chest tube present.  Status post open heart surgery. EXAM: PORTABLE CHEST 1 VIEW COMPARISON:  January 25, 2021. FINDINGS: Stable cardiomediastinal silhouette. Median sternotomy and CABG. Increased hazy left basilar opacity. No visible pneumothorax on this semi erect radiograph. Right IJ central venous catheter with the tip projecting at the superior cavoatrial junction. Left chest tube and mediastinal drain in similar position. Cervical ACDF. IMPRESSION: Increased hazy left basilar opacity, most likely  secondary to layering pleural effusion with overlying atelectasis in the postoperative setting. Electronically Signed   By: Margaretha Sheffield M.D.   On: 01/26/2021 08:41   DG Chest Port 1 View  Result Date: 01/25/2021 CLINICAL DATA:  Pneumothorax. EXAM: PORTABLE CHEST 1 VIEW COMPARISON:  January 21, 2021. FINDINGS: Stable cardiomediastinal silhouette. Status post coronary bypass graft. Endotracheal nasogastric tubes are in good position. Right internal jugular catheter is noted with tip in expected position of the SVC. Hypoinflation of the lungs is noted with minimal bibasilar subsegmental atelectasis. Bony thorax is unremarkable. No pneumothorax is noted. IMPRESSION: Endotracheal and nasogastric tubes in grossly good position. Hypoinflation of the lungs with minimal bibasilar subsegmental atelectasis. Electronically Signed   By: Marijo Conception M.D.   On: 01/25/2021 13:01   DG Chest Portable 1 View  Result Date: 01/09/2021 CLINICAL DATA:  Cough. EXAM: PORTABLE CHEST 1 VIEW COMPARISON:  July 14, 2020. FINDINGS: The heart size and mediastinal contours are within normal limits. Both lungs are clear. The visualized skeletal structures are unremarkable. IMPRESSION: No active disease. Electronically Signed   By: Marijo Conception M.D.   On: 01/09/2021 18:52   ECHO INTRAOPERATIVE TEE  Result Date: 01/25/2021  *INTRAOPERATIVE TRANSESOPHAGEAL REPORT *  Patient Name:   FLOYDE DINGLEY    Date of Exam: 01/25/2021 Medical Rec #:  [id number]      Height:       65.0 in Accession #:    [id number]     Weight:       190.0 lb Date of Birth:  January 10, 1951      BSA:          1.94 m Patient Age:    36 years       BP:           153/75 mmHg Patient Gender: M              HR:           84 bpm. Exam Location:  Anesthesiology Transesophogeal exam was perform intraoperatively during surgical procedure. Patient was closely monitored under general anesthesia during the entirety of examination.  Indications:     Type 2 diabetes mellitus  with polyneuropathy (HCC) [E11.42]                   CAD, multiple vessel [I25.10]                   Abnormal result of cardiovascular function study, unspecified                  [R94.30] Sonographer:     Darlina Sicilian RDCS Performing Phys: [id number] Lucile Crater LIGHTFOOT Diagnosing Phys: Myrtie Soman MD Complications: No known complications during this procedure. POST-OP IMPRESSIONS _ Left Ventricle: The left ventricle is unchanged from pre-bypass. _ Right Ventricle: The right ventricle appears unchanged from pre-bypass. _ Aorta: The aorta appears unchanged from pre-bypass. _ Left Atrium: The left atrium appears unchanged from pre-bypass. _ Left Atrial Appendage: The left atrial appendage appears unchanged from pre-bypass. _ Aortic Valve: The aortic valve appears unchanged from pre-bypass. _ Mitral Valve: The mitral valve appears unchanged from pre-bypass. _ Tricuspid Valve: The tricuspid valve appears unchanged from pre-bypass. _ Pulmonic Valve: The pulmonic valve appears unchanged from pre-bypass. _ Interatrial Septum: The interatrial septum appears unchanged from pre-bypass. _ Interventricular Septum: The interventricular septum appears unchanged from pre-bypass. _ Pericardium: The pericardium appears unchanged from pre-bypass. _ Comments: Good LV function post bypass No RWMA. PRE-OP FINDINGS  Left Ventricle: The left ventricle has normal systolic function, with an ejection fraction of 60-65%. The cavity size was normal. There is mildly increased left ventricular wall thickness. There is mild eccentric left ventricular hypertrophy of the septal segment. Right Ventricle: The right ventricle has normal systolic function. The cavity was normal. There is no increase in right ventricular wall thickness. Left Atrium: Left atrial size was normal in size. No left atrial/left atrial appendage thrombus was detected. The left atrial appendage is well visualized and there is no evidence of thrombus present. Right Atrium: Right  atrial size was normal in size. Interatrial Septum: No atrial level shunt detected by color flow Doppler. There is no evidence of a patent foramen ovale. Pericardium: There is no evidence of pericardial effusion. Mitral Valve: The mitral valve is normal in structure. Mitral valve regurgitation is trivial by color flow Doppler. There is No evidence of mitral stenosis. Tricuspid Valve: The tricuspid valve was normal in structure. Tricuspid valve regurgitation is trivial by color flow Doppler. No evidence of tricuspid stenosis is present. Aortic Valve: The aortic valve is normal in structure. Aortic valve regurgitation was not visualized by color flow Doppler. There is no stenosis of the aortic valve. Pulmonic Valve: The pulmonic valve was normal in structure, with normal. Pulmonic valve regurgitation is not visualized by color flow Doppler. Aorta: The aortic root and ascending aorta are normal in size and structure. Shunts: There is no evidence of an atrial septal defect.  +------------------+---------++ LV Volumes (MOD)            +------------------+---------++ LV area d, A2C:   31.20 cm +------------------+---------++ LV area s, A2C:   15.40 cm +------------------+---------++ LV major d, A2C:  8.32 cm   +------------------+---------++ LV major s, A2C:  6.84 cm   +------------------+---------++ LV vol d, MOD A2C:96.7 ml   +------------------+---------++ LV vol s, MOD A2C:30.2 ml   +------------------+---------++ LV SV MOD A2C:    66.5 ml   +------------------+---------++  Myrtie Soman MD Electronically signed by Myrtie Soman MD Signature Date/Time: 01/25/2021/5:45:56 PM    Final    VAS US DOPPLER PRE CABG  Result Date: 01/21/2021 PREOPERATIVE VASCULAR EVALUATION Patient Name:  CARTEZ MOGLE  Date of Exam:   01/21/2021 Medical Rec #: [id number]    Accession #:    [id number] Date of Birth: Jul 01, 1950    Patient Gender: M Patient Age:   9 years Exam Location:  [facility]  Procedure:      VAS US DOPPLER PRE CABG Referring Phys: HARRELL LIGHTFOOT --------------------------------------------------------------------------------  Indications:      Pre-CABG. Risk Factors:     Hypertension, Diabetes. Comparison Study: No prior studies. Performing Technologist: Carlos Levering RVT  Examination Guidelines: A complete evaluation includes B-mode imaging, spectral Doppler, color Doppler, and power Doppler as needed of all accessible portions of each vessel. Bilateral testing is considered an integral part of a complete examination. Limited examinations for reoccurring indications may be performed as noted.  Right Carotid Findings: +----------+--------+--------+--------+-----------------------+--------+           PSV cm/sEDV cm/sStenosisDescribe               Comments +----------+--------+--------+--------+-----------------------+--------+ CCA Prox  41      10              smooth and heterogenous         +----------+--------+--------+--------+-----------------------+--------+ CCA Distal87      21              smooth and heterogenous         +----------+--------+--------+--------+-----------------------+--------+ ICA Prox  90      20              smooth and heterogenous         +----------+--------+--------+--------+-----------------------+--------+ ICA Distal72      24                                     tortuous +----------+--------+--------+--------+-----------------------+--------+ ECA       100     17                                              +----------+--------+--------+--------+-----------------------+--------+ +----------+--------+-------+--------+------------+           PSV cm/sEDV cmsDescribeArm Pressure +----------+--------+-------+--------+------------+ Subclavian105                                 +----------+--------+-------+--------+------------+ +---------+--------+--+--------+-+---------+ VertebralPSV cm/s27EDV  cm/s5Antegrade +---------+--------+--+--------+-+---------+ Left Carotid Findings: +----------+--------+--------+--------+-----------------------+--------+           PSV cm/sEDV cm/sStenosisDescribe               Comments +----------+--------+--------+--------+-----------------------+--------+ CCA Prox  99      22              smooth and heterogenous         +----------+--------+--------+--------+-----------------------+--------+ CCA Distal86      15              smooth and heterogenous         +----------+--------+--------+--------+-----------------------+--------+ ICA Prox  70      18              smooth and heterogenous         +----------+--------+--------+--------+-----------------------+--------+ ICA Distal70      23  tortuous +----------+--------+--------+--------+-----------------------+--------+ ECA       82      12                                              +----------+--------+--------+--------+-----------------------+--------+ +----------+--------+--------+--------+------------+ SubclavianPSV cm/sEDV cm/sDescribeArm Pressure +----------+--------+--------+--------+------------+           165                                  +----------+--------+--------+--------+------------+ +---------+--------+--+--------+-+---------+ VertebralPSV cm/s19EDV cm/s6Antegrade +---------+--------+--+--------+-+---------+  ABI Findings: +--------+------------------+-----+---------+--------+ Right   Rt Pressure (mmHg)IndexWaveform Comment  +--------+------------------+-----+---------+--------+ [id number]                    triphasic         +--------+------------------+-----+---------+--------+ PTA     158               1.09 triphasic         +--------+------------------+-----+---------+--------+ DP      149               1.03 triphasic         +--------+------------------+-----+---------+--------+  +--------+------------------+-----+---------+-------+ Left    Lt Pressure (mmHg)IndexWaveform Comment +--------+------------------+-----+---------+-------+ [id number]                    triphasic        +--------+------------------+-----+---------+-------+ PTA     156               1.08 triphasic        +--------+------------------+-----+---------+-------+ DP      164               1.13 triphasic        +--------+------------------+-----+---------+-------+  Right Doppler Findings: +--------+--------+-----+---------+--------+ Site    PressureIndexDoppler  Comments +--------+--------+-----+---------+--------+ [id number]          triphasic         +--------+--------+-----+---------+--------+ Radial               triphasic         +--------+--------+-----+---------+--------+ Ulnar                triphasic         +--------+--------+-----+---------+--------+  Left Doppler Findings: +--------+--------+-----+---------+--------+ Site    PressureIndexDoppler  Comments +--------+--------+-----+---------+--------+ [id number]          triphasic         +--------+--------+-----+---------+--------+ Radial               triphasic         +--------+--------+-----+---------+--------+ Ulnar                triphasic         +--------+--------+-----+---------+--------+  Summary: Right Carotid: Velocities in the right ICA are consistent with a 1-39% stenosis. Left Carotid: Velocities in the left ICA are consistent with a 1-39% stenosis. Vertebrals: Bilateral vertebral arteries demonstrate antegrade flow. Right ABI: Resting right ankle-brachial index is within normal range. No evidence of significant right lower extremity arterial disease. Left ABI: Resting left ankle-brachial index is within normal range. No evidence of significant left lower extremity arterial disease. Right Upper Extremity: Doppler waveforms decrease >50% with right radial compression. Doppler  waveforms remain within normal limits with right ulnar compression. Left Upper Extremity: Doppler waveform obliterate with left radial compression. Doppler waveforms  remain within normal limits with left ulnar compression.  Electronically signed by Orlie Pollen on 01/21/2021 at 6:03:22 PM.    Final    US Abdomen Limited RUQ (LIVER/GB)  Result Date: 01/09/2021 CLINICAL DATA:  RIGHT upper quadrant pain EXAM: ULTRASOUND ABDOMEN LIMITED RIGHT UPPER QUADRANT COMPARISON:  June 16, 2016 FINDINGS: Evaluation is limited secondary to patient inability to tolerate positioning. Gallbladder: No gallstones or wall thickening visualized. No sonographic Murphy sign noted by sonographer. Common bile duct: Diameter: 5 mm, normal Liver: No focal lesion identified. Diffusely increased parenchymal echogenicity with underlying heterogeneity. Mildly nodular contour. Portal vein is patent on color Doppler imaging with normal direction of blood flow towards the liver. Other: None. IMPRESSION: Heterogeneous appearance of the liver with suggestion of nodular contour may reflect underlying cirrhosis. Electronically Signed   By: Valentino Saxon M.D.   On: 01/09/2021 18:32     Discharge Medications: Allergies as of 02/02/2021       Reactions   Morphine And Related Itching   Possible itching due to morphine 01/26/21   Other Other (See Comments)   Cats- eyes burn        Medication List     STOP taking these medications    amLODipine 5 MG tablet Commonly known as: NORVASC   APPLE CIDER VINEGAR PO   benzonatate 100 MG capsule Commonly known as: TESSALON   diclofenac Sodium 1 % Gel Commonly known as: VOLTAREN   HYDROcodone-acetaminophen 5-325 MG tablet Commonly known as: NORCO/VICODIN   insulin lispro 100 UNIT/ML KwikPen Commonly known as: HUMALOG   nitroGLYCERIN 0.4 MG SL tablet Commonly known as: Nitrostat   omeprazole 40 MG capsule Commonly known as: PRILOSEC   traMADol 50 MG tablet Commonly known  as: ULTRAM       TAKE these medications    acetaminophen 500 MG tablet Commonly known as: TYLENOL Take 1,500-2,000 mg by mouth daily. What changed: Another medication with the same name was removed. Continue taking this medication, and follow the directions you see here.   albuterol (2.5 MG/3ML) 0.083% nebulizer solution Commonly known as: PROVENTIL Take 2.5 mg by nebulization every 4 (four) hours as needed for wheezing or shortness of breath.   albuterol 108 (90 Base) MCG/ACT inhaler Commonly known as: VENTOLIN HFA Inhale 2 puffs into the lungs every 4 (four) hours as needed for wheezing or shortness of breath.   allopurinol 100 MG tablet Commonly known as: ZYLOPRIM Take 100 mg by mouth daily.   Anoro Ellipta 62.5-25 MCG/ACT Aepb Generic drug: umeclidinium-vilanterol Inhale 1 puff into the lungs daily.   aspirin 81 MG EC tablet Take 1 tablet (81 mg total) by mouth daily. Swallow whole. Start taking on: February 03, 2021   Basaglar KwikPen 100 UNIT/ML Inject 20 Units into the skin daily. What changed: how much to take   clopidogrel 75 MG tablet Commonly known as: PLAVIX Take 1 tablet (75 mg total) by mouth daily.   furosemide 20 MG tablet Commonly known as: LASIX Take 20 mg by mouth daily.   gabapentin 300 MG capsule Commonly known as: NEURONTIN Take 600 mg by mouth 2 (two) times daily.   insulin aspart 100 UNIT/ML injection Commonly known as: novoLOG Inject 0-24 Units into the skin 3 (three) times daily with meals.   loratadine 10 MG tablet Commonly known as: CLARITIN Take 1 tablet (10 mg total) by mouth daily.   menthol-cetylpyridinium 3 MG lozenge Commonly known as: CEPACOL Take 1 lozenge (3 mg total) by mouth as needed for sore  throat.   metFORMIN 500 MG 24 hr tablet Commonly known as: GLUCOPHAGE-XR Take 1,000 mg by mouth in the morning and at bedtime.   metoprolol tartrate 25 MG tablet Commonly known as: LOPRESSOR Take 1 tablet (25 mg total) by  mouth 2 (two) times daily.   oxyCODONE 5 MG immediate release tablet Commonly known as: Oxy IR/ROXICODONE Take 1 tablet (5 mg total) by mouth every 6 (six) hours as needed for severe pain.   polyethylene glycol 17 g packet Commonly known as: MIRALAX / GLYCOLAX Take 17 g by mouth daily as needed for mild constipation.   rosuvastatin 20 MG tablet Commonly known as: CRESTOR Take 1 tablet (20 mg total) by mouth daily.   tamsulosin 0.4 MG Caps capsule Commonly known as: FLOMAX Take 1 capsule (0.4 mg total) by mouth daily. Start taking on: February 03, 2021   VISINE EXTRA OP Place 1 drop into both eyes as needed (itching).   Vitamin D (Ergocalciferol) 1.25 MG (50000 UNIT) Caps capsule Commonly known as: DRISDOL Take 50,000 Units by mouth once a week.   zolpidem 10 MG tablet Commonly known as: AMBIEN Take 10 mg by mouth at bedtime.       The patient has been discharged on:   1.Beta Blocker:  Yes [ y  ]                              No   [   ]                              If No, reason:  2.Ace Inhibitor/ARB: Yes [   ]                                     No  [ n   ]                                     If No, reason:labile BP  3.Statin:   Yes [ y  ]                  No  [   ]                  If No, reason:  4.Ecasa:  Yes  Blue.Reese   ]                  No   [   ]                  If No, reason:  Patient had ACS upon admission:  Plavix/P2Y12 inhibitor: Yes [  y ]                                      No  [   ]   Follow Up Appointments:  Follow-up Information     Leonie Man, MD Follow up.   Specialty: Cardiology Contact information: [address] [place] [address] [phone]         Lajuana Matte, MD Follow up on 02/04/2021.   Specialty: Cardiothoracic Surgery Why: Appointement  time is at 3:10 pm. Dr. Kipp Brood will call you. Please do NOT go to the office. Contact information: [address] [place]  [address] [phone]         Leanna Battles, MD. Call.   Specialty: Internal Medicine Why: for a follow up appointment regarding further diabetes treatment and surveillance of HGA1C 8.8 Contact information: [place] [address] [phone]         Darreld Mclean, PA-C. Go on 02/11/2021.   Specialties: Physician Assistant, Cardiology Why: Appointment time is at 2:45 pm Contact information: [address] [place] [address] [phone]                 Signed: Alvester Chou 02/02/2021, 12:47 PM

## 2021-01-26 NOTE — Progress Notes (Addendum)
[facility] DAILY ICU PROGRESS NOTE                   [address]            [place] [address]          [phone]   1 Day Post-Op Procedure(s) (LRB): CORONARY ARTERY BYPASS GRAFTING (CABG) X2, USING LEFT INTERNAL MAMMARY ARTERY AND LEFT LEG GREATER SAPHENOUS VEIN HARVESTED ENDOSCOPICALLY (N/A) TRANSESOPHAGEAL ECHOCARDIOGRAM (TEE) (N/A) ENDOVEIN HARVEST OF GREATER SAPHENOUS VEIN (Bilateral) APPLICATION OF CELL SAVER  Total Length of Stay:  LOS: 1 day   Subjective: Patient with itching all over this am. He is awake and alert. He has some pain at chest tube sites as well.  Objective: Vital signs in last 24 hours: Temp:  [98.6 F (37 C)-100.9 F (38.3 C)] 99.9 F (37.7 C) (10/12 0615) Pulse Rate:  [77-103] 86 (10/12 0615) Resp:  [15-31] 25 (10/12 0615) BP: (89-166)/(54-74) 135/56 (10/12 0600) SpO2:  [88 %-97 %] 95 % (10/12 0615) Weight:  [88.2 kg] 88.2 kg (10/12 0604)  Filed Weights   01/25/21 0602 01/26/21 0604  Weight: 86.2 kg 88.2 kg    Weight change: 2.041 kg   Hemodynamic parameters for last 24 hours: CVP:  [15 mmHg-17 mmHg] 17 mmHg  Intake/Output from previous day: 10/11 0701 - 10/12 0700 In: 5569.7 [I.V.:3183.4; Blood:300; IV Piggyback:2086.3] Out: 2775 [Urine:2315; Blood:200; Chest Tube:260]  Intake/Output this shift: Total I/O In: 1700.5 [I.V.:825.6; IV Piggyback:874.9] Out: 470 [Urine:390; Chest Tube:80]  Current Meds: Scheduled Meds:  acetaminophen  1,000 mg Oral Q6H   Or   acetaminophen (TYLENOL) oral liquid 160 mg/5 mL  1,000 mg Per Tube Q6H   allopurinol  100 mg Oral Daily   aspirin EC  325 mg Oral Daily   Or   aspirin  324 mg Per Tube Daily   bisacodyl  10 mg Oral Daily   Or   bisacodyl  10 mg Rectal Daily   chlorhexidine gluconate (MEDLINE KIT)  15 mL Mouth Rinse BID   Chlorhexidine Gluconate Cloth  6 each Topical Q0600   docusate sodium  200 mg Oral Daily   gabapentin  600 mg Oral BID   loratadine  10 mg Oral Daily   mouth  rinse  15 mL Mouth Rinse q12n4p   metoprolol tartrate  12.5 mg Oral BID   Or   metoprolol tartrate  12.5 mg Per Tube BID   [START ON 01/27/2021] pantoprazole  40 mg Oral Daily   rosuvastatin  20 mg Oral Daily   sodium chloride flush  3 mL Intravenous Q12H   Continuous Infusions:  sodium chloride     sodium chloride     sodium chloride Stopped (01/26/21 0553)    ceFAZolin (ANCEF) IV 200 mL/hr at 01/26/21 0600   dexmedetomidine (PRECEDEX) IV infusion Stopped (01/26/21 0432)   insulin 6.5 Units/hr (01/26/21 0600)   lactated ringers     lactated ringers 20 mL/hr at 01/26/21 0600   nitroGLYCERIN Stopped (01/25/21 1230)   norepinephrine (LEVOPHED) Adult infusion 8 mcg/min (01/26/21 0645)   phenylephrine (NEO-SYNEPHRINE) Adult infusion Stopped (01/25/21 1230)   sodium chloride Stopped (01/26/21 0225)   PRN Meds:.sodium chloride, dextrose, lip balm, metoprolol tartrate, midazolam, morphine injection, oxyCODONE, [COMPLETED] albumin human **AND** sodium chloride, sodium chloride flush  General appearance: alert, cooperative, and no distress Neurologic: intact Heart: RRR Lungs: Diminished bibasilar breath sounds Abdomen: Soft, protuberant, non tender, sporadic bowel sounds Extremities: SCDs present Wound: Aquacel intact;Bilateral LE peeled back  and both wounds are clean and dry  Lab Results: CBC: Recent Labs    01/25/21 1851 01/26/21 0358  WBC 14.4* 17.1*  HGB 9.5* 9.3*  HCT 29.1* 28.7*  PLT 145* 142*   BMET:  Recent Labs    01/25/21 1851 01/26/21 0358  NA 136 136  K 4.0 3.9  CL 106 107  CO2 22 20*  GLUCOSE 181* 122*  BUN 10 12  CREATININE 0.90 0.93  CALCIUM 7.8* 7.8*    CMET: Lab Results  Component Value Date   WBC 17.1 (H) 01/26/2021   HGB 9.3 (L) 01/26/2021   HCT 28.7 (L) 01/26/2021   PLT 142 (L) 01/26/2021   GLUCOSE 122 (H) 01/26/2021   CHOL 119 12/21/2020   TRIG 201 (H) 12/21/2020   HDL 24 (L) 12/21/2020   LDLCALC 61 12/21/2020   ALT 26 01/21/2021   AST  40 01/21/2021   NA 136 01/26/2021   K 3.9 01/26/2021   CL 107 01/26/2021   CREATININE 0.93 01/26/2021   BUN 12 01/26/2021   CO2 20 (L) 01/26/2021   TSH 2.852 07/14/2020   INR 1.5 (H) 01/25/2021   HGBA1C 8.8 (H) 01/21/2021      PT/INR:  Recent Labs    01/25/21 1248  LABPROT 18.2*  INR 1.5*   Radiology: [facility] Chest Port 1 View  Result Date: 01/25/2021 CLINICAL DATA:  Pneumothorax. EXAM: PORTABLE CHEST 1 VIEW COMPARISON:  January 21, 2021. FINDINGS: Stable cardiomediastinal silhouette. Status post coronary bypass graft. Endotracheal nasogastric tubes are in good position. Right internal jugular catheter is noted with tip in expected position of the SVC. Hypoinflation of the lungs is noted with minimal bibasilar subsegmental atelectasis. Bony thorax is unremarkable. No pneumothorax is noted. IMPRESSION: Endotracheal and nasogastric tubes in grossly good position. Hypoinflation of the lungs with minimal bibasilar subsegmental atelectasis. Electronically Signed   By: Marijo Conception M.D.   On: 01/25/2021 13:01   ECHO INTRAOPERATIVE TEE  Result Date: 01/25/2021  *INTRAOPERATIVE TRANSESOPHAGEAL REPORT *  Patient Name:   Paul [name]    Date of Exam: 01/25/2021 Medical Rec #:  [id number]      Height:       65.0 in Accession #:    [id number]     Weight:       190.0 lb Date of Birth:  [birth year]      BSA:          1.94 m Patient Age:    70 years       BP:           153/75 mmHg Patient Gender: M              HR:           84 bpm. Exam Location:  Anesthesiology Transesophogeal exam was perform intraoperatively during surgical procedure. Patient was closely monitored under general anesthesia during the entirety of examination. Indications:     Type 2 diabetes mellitus with polyneuropathy (HCC) [E11.42]                   CAD, multiple vessel [I25.10]                   Abnormal result of cardiovascular function study, unspecified                  [R94.30] Sonographer:     Darlina Sicilian RDCS Performing Phys:  [id number] Lucile Crater Lakeeta Dobosz Diagnosing Phys: Myrtie Soman MD Complications: No known complications during  this procedure. POST-OP IMPRESSIONS _ Left Ventricle: The left ventricle is unchanged from pre-bypass. _ Right Ventricle: The right ventricle appears unchanged from pre-bypass. _ Aorta: The aorta appears unchanged from pre-bypass. _ Left Atrium: The left atrium appears unchanged from pre-bypass. _ Left Atrial Appendage: The left atrial appendage appears unchanged from pre-bypass. _ Aortic Valve: The aortic valve appears unchanged from pre-bypass. _ Mitral Valve: The mitral valve appears unchanged from pre-bypass. _ Tricuspid Valve: The tricuspid valve appears unchanged from pre-bypass. _ Pulmonic Valve: The pulmonic valve appears unchanged from pre-bypass. _ Interatrial Septum: The interatrial septum appears unchanged from pre-bypass. _ Interventricular Septum: The interventricular septum appears unchanged from pre-bypass. _ Pericardium: The pericardium appears unchanged from pre-bypass. _ Comments: Good LV function post bypass No RWMA. PRE-OP FINDINGS  Left Ventricle: The left ventricle has normal systolic function, with an ejection fraction of 60-65%. The cavity size was normal. There is mildly increased left ventricular wall thickness. There is mild eccentric left ventricular hypertrophy of the septal segment. Right Ventricle: The right ventricle has normal systolic function. The cavity was normal. There is no increase in right ventricular wall thickness. Left Atrium: Left atrial size was normal in size. No left atrial/left atrial appendage thrombus was detected. The left atrial appendage is well visualized and there is no evidence of thrombus present. Right Atrium: Right atrial size was normal in size. Interatrial Septum: No atrial level shunt detected by color flow Doppler. There is no evidence of a patent foramen ovale. Pericardium: There is no evidence of pericardial effusion. Mitral Valve: The mitral  valve is normal in structure. Mitral valve regurgitation is trivial by color flow Doppler. There is No evidence of mitral stenosis. Tricuspid Valve: The tricuspid valve was normal in structure. Tricuspid valve regurgitation is trivial by color flow Doppler. No evidence of tricuspid stenosis is present. Aortic Valve: The aortic valve is normal in structure. Aortic valve regurgitation was not visualized by color flow Doppler. There is no stenosis of the aortic valve. Pulmonic Valve: The pulmonic valve was normal in structure, with normal. Pulmonic valve regurgitation is not visualized by color flow Doppler. Aorta: The aortic root and ascending aorta are normal in size and structure. Shunts: There is no evidence of an atrial septal defect.  +------------------+---------++ LV Volumes (MOD)            +------------------+---------++ LV area d, A2C:   31.20 cm +------------------+---------++ LV area s, A2C:   15.40 cm +------------------+---------++ LV major d, A2C:  8.32 cm   +------------------+---------++ LV major s, A2C:  6.84 cm   +------------------+---------++ LV vol d, MOD A2C:96.7 ml   +------------------+---------++ LV vol s, MOD A2C:30.2 ml   +------------------+---------++ LV SV MOD A2C:    66.5 ml   +------------------+---------++  Myrtie Soman MD Electronically signed by Myrtie Soman MD Signature Date/Time: 01/25/2021/5:45:56 PM    Final      Assessment/Plan: S/P Procedure(s) (LRB): CORONARY ARTERY BYPASS GRAFTING (CABG) X2, USING LEFT INTERNAL MAMMARY ARTERY AND LEFT LEG GREATER SAPHENOUS VEIN HARVESTED ENDOSCOPICALLY (N/A) TRANSESOPHAGEAL ECHOCARDIOGRAM (TEE) (N/A) ENDOVEIN HARVEST OF GREATER SAPHENOUS VEIN (Bilateral) APPLICATION OF CELL SAVER CV-CO/CI 6.6/3.4. On Levophed drip and Lopressor 12.5 mg bid. 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 As discussed with Dr. Kipp Brood, will remove EPW, restart Plavix 75 mg daily, and decrease ec asa to 81 mg daily. Will give one more  Albumin. If BP still low later today, consider starting Midodrine. Pulmonary-on 5 liters via oxygen. History of COPD. Chest tubes with 260 cc since surgery. CXR  this am appears to show with minimal bibasilar atelectasis/small effusions. Chest tubes to remain today. Of note, patient on inhalers prior to surgery. Will discuss with pharmacy as several prolong QT and he has a history of this. Encourage incentive spirometer Volume overload-will slowly diurese once off Levophed DM-CBGs 121/120/121. Wean off Insulin drip. Pre o HGA1C 8.8. Will restart Metformin closer/at discharge. Will need close medical follow up after discharge. Expected post op blood loss anemia-H and H this am stable at 9.3 and 28.7 Mild thrombocytopenia-platelets this am 142,000 Regarding diffuse itching, may be Morphine (according to patient). Per Dr. Kipp Brood, give IV Tylenol and/or Benadryl. May need to switch IV narcotic 8. Please see progression orders    Donielle Liston Alba PA-C 01/26/2021 6:53 AM   Agree with above Doing well Will remove wires, start plavix, and low aspirin dose Will add IV tylenol Will switch to SSI, and levemir  [place]

## 2021-01-26 NOTE — Plan of Care (Signed)
  Problem: Education: Goal: Knowledge of General Education information will improve Description: Including pain rating scale, medication(s)/side effects and non-pharmacologic comfort measures Outcome: Progressing   Problem: Health Behavior/Discharge Planning: Goal: Ability to manage health-related needs will improve Outcome: Progressing   Problem: Clinical Measurements: Goal: Ability to maintain clinical measurements within normal limits will improve Outcome: Progressing Goal: Will remain free from infection Outcome: Progressing Goal: Diagnostic test results will improve Outcome: Progressing Goal: Respiratory complications will improve Outcome: Progressing Goal: Cardiovascular complication will be avoided Outcome: Progressing   Problem: Activity: Goal: Risk for activity intolerance will decrease Outcome: Progressing   Problem: Nutrition: Goal: Adequate nutrition will be maintained Outcome: Progressing   Problem: Coping: Goal: Level of anxiety will decrease Outcome: Progressing   Problem: Elimination: Goal: Will not experience complications related to bowel motility Outcome: Progressing Goal: Will not experience complications related to urinary retention Outcome: Progressing   Problem: Pain Managment: Goal: General experience of comfort will improve Outcome: Progressing   Problem: Safety: Goal: Ability to remain free from injury will improve Outcome: Progressing   Problem: Skin Integrity: Goal: Risk for impaired skin integrity will decrease Outcome: Progressing   Problem: Education: Goal: Will demonstrate proper wound care and an understanding of methods to prevent future damage Outcome: Progressing Goal: Knowledge of disease or condition will improve Outcome: Progressing Goal: Knowledge of the prescribed therapeutic regimen will improve Outcome: Progressing Goal: Individualized Educational Video(s) Outcome: Progressing   Problem: Activity: Goal: Risk for  activity intolerance will decrease Outcome: Progressing   Problem: Cardiac: Goal: Will achieve and/or maintain hemodynamic stability Outcome: Progressing   Problem: Clinical Measurements: Goal: Postoperative complications will be avoided or minimized Outcome: Progressing   Problem: Respiratory: Goal: Respiratory status will improve Outcome: Progressing   Problem: Skin Integrity: Goal: Wound healing without signs and symptoms of infection Outcome: Progressing Goal: Risk for impaired skin integrity will decrease Outcome: Progressing   Problem: Urinary Elimination: Goal: Ability to achieve and maintain adequate renal perfusion and functioning will improve Outcome: Progressing   Problem: Education: Goal: Will demonstrate proper wound care and an understanding of methods to prevent future damage Outcome: Progressing Goal: Knowledge of disease or condition will improve Outcome: Progressing Goal: Knowledge of the prescribed therapeutic regimen will improve Outcome: Progressing Goal: Individualized Educational Video(s) Outcome: Progressing   Problem: Activity: Goal: Risk for activity intolerance will decrease Outcome: Progressing   Problem: Cardiac: Goal: Will achieve and/or maintain hemodynamic stability Outcome: Progressing   Problem: Clinical Measurements: Goal: Postoperative complications will be avoided or minimized Outcome: Progressing   Problem: Respiratory: Goal: Respiratory status will improve Outcome: Progressing   Problem: Skin Integrity: Goal: Wound healing without signs and symptoms of infection Outcome: Progressing Goal: Risk for impaired skin integrity will decrease Outcome: Progressing   Problem: Urinary Elimination: Goal: Ability to achieve and maintain adequate renal perfusion and functioning will improve Outcome: Progressing   

## 2021-01-27 ENCOUNTER — Inpatient Hospital Stay (HOSPITAL_COMMUNITY): Payer: Medicare Other

## 2021-01-27 LAB — BASIC METABOLIC PANEL
Anion gap: 8 (ref 5–15)
BUN: 14 mg/dL (ref 8–23)
CO2: 22 mmol/L (ref 22–32)
Calcium: 8 mg/dL — ABNORMAL LOW (ref 8.9–10.3)
Chloride: 104 mmol/L (ref 98–111)
Creatinine, Ser: 1.15 mg/dL (ref 0.61–1.24)
GFR, Estimated: >60 mL/min (ref >60)
Glucose, Bld: 190 mg/dL — ABNORMAL HIGH (ref 70–99)
Potassium: 4.3 mmol/L (ref 3.5–5.1)
Sodium: 134 mmol/L — ABNORMAL LOW (ref 135–145)

## 2021-01-27 LAB — CBC
HCT: 26.4 % — ABNORMAL LOW (ref 39.0–52.0)
Hemoglobin: 8.2 g/dL — ABNORMAL LOW (ref 13.0–17.0)
MCH: 29.1 pg (ref 26.0–34.0)
MCHC: 31.1 g/dL (ref 30.0–36.0)
MCV: 93.6 fL (ref 80.0–100.0)
Platelets: 94 10*3/uL — ABNORMAL LOW (ref 150–400)
RBC: 2.82 MIL/uL — ABNORMAL LOW (ref 4.22–5.81)
RDW: 14.6 % (ref 11.5–15.5)
WBC: 9.1 10*3/uL (ref 4.0–10.5)
nRBC: 0 % (ref 0.0–0.2)

## 2021-01-27 LAB — GLUCOSE, CAPILLARY
Glucose-Capillary: 164 mg/dL — ABNORMAL HIGH (ref 70–99)
Glucose-Capillary: 173 mg/dL — ABNORMAL HIGH (ref 70–99)
Glucose-Capillary: 177 mg/dL — ABNORMAL HIGH (ref 70–99)
Glucose-Capillary: 178 mg/dL — ABNORMAL HIGH (ref 70–99)
Glucose-Capillary: 180 mg/dL — ABNORMAL HIGH (ref 70–99)
Glucose-Capillary: 258 mg/dL — ABNORMAL HIGH (ref 70–99)

## 2021-01-27 MED ORDER — FUROSEMIDE 10 MG/ML IJ SOLN
40.0000 mg | Freq: Once | INTRAMUSCULAR | Status: AC
  Administered 2021-01-27: 40 mg via INTRAVENOUS
  Filled 2021-01-27: qty 4

## 2021-01-27 MED ORDER — UMECLIDINIUM-VILANTEROL 62.5-25 MCG/INH IN AEPB
1.0000 | INHALATION_SPRAY | Freq: Every day | RESPIRATORY_TRACT | Status: DC
  Administered 2021-01-28 – 2021-02-02 (×6): 1 via RESPIRATORY_TRACT
  Filled 2021-01-27: qty 14

## 2021-01-27 MED ORDER — CHLORHEXIDINE GLUCONATE 0.12 % MT SOLN
OROMUCOSAL | Status: AC
  Filled 2021-01-27: qty 15

## 2021-01-27 MED ORDER — POTASSIUM CHLORIDE CRYS ER 20 MEQ PO TBCR
40.0000 meq | EXTENDED_RELEASE_TABLET | Freq: Every day | ORAL | Status: DC
  Administered 2021-01-27 – 2021-02-02 (×7): 40 meq via ORAL
  Filled 2021-01-27 (×7): qty 2

## 2021-01-27 MED FILL — Heparin Sodium (Porcine) Inj 1000 Unit/ML: INTRAMUSCULAR | Qty: 10 | Status: AC

## 2021-01-27 MED FILL — Sodium Bicarbonate IV Soln 8.4%: INTRAVENOUS | Qty: 50 | Status: AC

## 2021-01-27 MED FILL — Electrolyte-R (PH 7.4) Solution: INTRAVENOUS | Qty: 3000 | Status: AC

## 2021-01-27 MED FILL — Mannitol IV Soln 20%: INTRAVENOUS | Qty: 500 | Status: AC

## 2021-01-27 MED FILL — Sodium Chloride IV Soln 0.9%: INTRAVENOUS | Qty: 3000 | Status: AC

## 2021-01-27 MED FILL — Calcium Chloride Inj 10%: INTRAVENOUS | Qty: 10 | Status: AC

## 2021-01-27 NOTE — Evaluation (Signed)
Occupational Therapy Evaluation Patient Details Name: Paul [name] MRN: [id number] DOB: [birth year] Today's Date: 01/27/2021   History of Present Illness The pt is a 70 yo male presenting 10/11 for CABG x2 due to CAD. PMH includes: HTN, DM II, and obesity.   Clinical Impression   Patient admitted for the above diagnosis and procedure.  PTA he lives alone, cared for his own ADL/IADL, has a history of falls, but used a RW on occasion.  Patient continues to drive.  Deficits impacting independence are listed below.  Currently he is needing up to Mod A for lower body ADL, and up to Mod A for basic transfers as he continues to fatigue.  Initially he needed Min A of 2 for mobility, but quickly fatigued.  OT to follow in the acute setting to maximize his functional status, but CIR is recommended.  The patient would benefit from an aggressive multi disciplined post acute rehab approach to assist with an eventual return home at mix functional level.        Recommendations for follow up therapy are one component of a multi-disciplinary discharge planning process, led by the attending physician.  Recommendations may be updated based on patient status, additional functional criteria and insurance authorization.   Follow Up Recommendations  CIR    Equipment Recommendations  3 in 1 bedside commode;Tub/shower seat    Recommendations for Other Services Rehab consult     Precautions / Restrictions Precautions Precautions: Fall Precaution Comments: pt reports frequent falls at home Restrictions Weight Bearing Restrictions: Yes Other Position/Activity Restrictions: sternal precautions      Mobility Bed Mobility Overal bed mobility: Needs Assistance Bed Mobility: Rolling;Sidelying to Sit Rolling: Min assist Sidelying to sit: Min assist;HOB elevated       General bed mobility comments: minA to complete rolling to maintain precautions, minA to elevate trunk with pt holding pillow     Transfers Overall transfer level: Needs assistance Equipment used: None Transfers: Sit to/from [facility] Sit to Stand: Mod assist Stand pivot transfers: Min assist;+2 safety/equipment       General transfer comment: increasing assist with mobility and toileting activity.    Balance Overall balance assessment: Needs assistance;History of Falls Sitting-balance support: No upper extremity supported;Feet supported Sitting balance-Leahy Scale: Fair     Standing balance support: Bilateral upper extremity supported;During functional activity Standing balance-Leahy Scale: Poor Standing balance comment: mild instability with standing, benefits from BUE support for gait                           ADL either performed or assessed with clinical judgement   ADL Overall ADL's : Needs assistance/impaired Eating/Feeding: Set up;Sitting   Grooming: Wash/dry hands;Min guard;Standing   Upper Body Bathing: Moderate assistance;Sitting   Lower Body Bathing: Moderate assistance;Sitting/lateral leans   Upper Body Dressing : Moderate assistance;Sitting   Lower Body Dressing: Moderate assistance;Sitting/lateral leans   Toilet Transfer: Minimal assistance;+2 for safety/equipment;RW   Toileting- Clothing Manipulation and Hygiene: Maximal assistance;Sit to/from stand       Functional mobility during ADLs: Minimal assistance;+2 for safety/equipment       Vision Baseline Vision/History: 1 Wears glasses Patient Visual Report: No change from baseline                  Pertinent Vitals/Pain Pain Assessment: Faces Faces Pain Scale: Hurts little more Pain Location: incision Pain Descriptors / Indicators: Discomfort Pain Intervention(s): Monitored during session     Hand Dominance Right  Extremity/Trunk Assessment Upper Extremity Assessment Upper Extremity Assessment: RUE deficits/detail;LUE deficits/detail;Generalized weakness RUE Deficits / Details:  decreased end range shoulder FF due to arthritis RUE Sensation: WNL RUE Coordination: WNL LUE Deficits / Details: decreased end range shoulder FF due to arthritis LUE Sensation: WNL LUE Coordination: WNL   Lower Extremity Assessment Lower Extremity Assessment: Defer to PT evaluation   Cervical / Trunk Assessment Cervical / Trunk Assessment: Other exceptions Cervical / Trunk Exceptions: s/p chest surgery, sternal precautions   Communication Communication Communication: No difficulties   Cognition Arousal/Alertness: Awake/alert Behavior During Therapy: WFL for tasks assessed/performed Overall Cognitive Status: Within Functional Limits for tasks assessed                                 General Comments: pt able to follow instructions given through session. good insight to exertion and verbalizing need for rest breaks   General Comments  VSS on 6L O2. SpO2 96 with gait    Exercises     Shoulder Instructions      Home Living Family/patient expects to be discharged to:: Private residence Living Arrangements: Alone Available Help at Discharge: Family;Friend(s);Available PRN/intermittently Type of Home: House Home Access: Stairs to enter [employer] of Steps: 3-5 Entrance Stairs-Rails: Right;Left Home Layout: Multi-level;One level Alternate Level Stairs-Number of Steps: patient has an add on-addition to his bedroom, 2 steps down to enter his bedroom.   Bathroom Shower/Tub: Teacher, early years/pre: Standard Bathroom Accessibility: Yes How Accessible: Accessible via walker Home Equipment: [place] - 4 wheels;Tub bench   Additional Comments: pt reports he uses furniture walking more than DME      Prior Functioning/Environment Level of Independence: Independent                OT Problem List: Decreased strength;Decreased range of motion;Decreased activity tolerance;Impaired balance (sitting and/or standing);Decreased knowledge of use  of DME or AE;Pain      OT Treatment/Interventions: Self-care/ADL training;Therapeutic exercise;Therapeutic activities;Balance training;Patient/family education;DME and/or AE instruction;Energy conservation    OT Goals(Current goals can be found in the care plan section) Acute Rehab OT Goals Patient Stated Goal: Get stronger and take care of myself OT Goal Formulation: With patient Time For Goal Achievement: 02/10/21 Potential to Achieve Goals: Good ADL Goals Pt Will Perform Grooming: with supervision;standing Pt Will Perform Lower Body Bathing: with supervision;sit to/from stand Pt Will Perform Lower Body Dressing: with supervision;sit to/from stand Pt Will Transfer to Toilet: with supervision;ambulating;regular height toilet Pt Will Perform Toileting - Clothing Manipulation and hygiene: with supervision;sit to/from stand  OT Frequency: Min 2X/week   Barriers to D/C:    None noted.  Patient states brother and two sisters can stay with him post acute as needed.       Co-evaluation PT/OT/SLP Co-Evaluation/Treatment: Yes Reason for Co-Treatment: Complexity of the patient's impairments (multi-system involvement);For patient/therapist safety PT goals addressed during session: Mobility/safety with mobility;Balance;Proper use of DME OT goals addressed during session: ADL's and self-care      AM-PAC OT "6 Clicks" Daily Activity     Outcome Measure Help from another person eating meals?: A Little Help from another person taking care of personal grooming?: A Little Help from another person toileting, which includes using toliet, bedpan, or urinal?: A Lot Help from another person bathing (including washing, rinsing, drying)?: A Lot Help from another person to put on and taking off regular upper body clothing?: A Lot Help from another person to  put on and taking off regular lower body clothing?: A Lot 6 Click Score: 14   End of Session Equipment Utilized During Treatment: Gait belt;Other  (comment);Oxygen;Rolling walker (HHA of two and use of RW) Nurse Communication: Mobility status  Activity Tolerance: Patient limited by fatigue Patient left: in chair;with call bell/phone within reach;with chair alarm set  OT Visit Diagnosis: Unsteadiness on feet (R26.81);Muscle weakness (generalized) (M62.81);History of falling (Z91.81);Pain                Time: 1530-1603 OT Time Calculation (min): 33 min Charges:  OT General Charges $OT Visit: 1 Visit OT Evaluation $OT Eval Moderate Complexity: 1 Mod  01/27/2021  RP, OTR/L  Acute Rehabilitation Services  Office:  Ginger Blue 01/27/2021, 5:32 PM

## 2021-01-27 NOTE — Progress Notes (Signed)
      [address]       [place] [address]             [phone]                 2 Days Post-Op Procedure(s) (LRB): CORONARY ARTERY BYPASS GRAFTING (CABG) X2, USING LEFT INTERNAL MAMMARY ARTERY AND LEFT LEG GREATER SAPHENOUS VEIN HARVESTED ENDOSCOPICALLY (N/A) TRANSESOPHAGEAL ECHOCARDIOGRAM (TEE) (N/A) ENDOVEIN HARVEST OF GREATER SAPHENOUS VEIN (Bilateral) APPLICATION OF CELL SAVER   Events: No events overnight. _______________________________________________________________ Vitals: BP (!) 96/51   Pulse 86   Temp 99.7 F (37.6 C)   Resp (!) 22   Ht 5\' 5"  (1.651 m)   Wt 88.5 kg   SpO2 94%   BMI 32.47 kg/m  Filed Weights   01/25/21 0602 01/26/21 0604 01/27/21 0500  Weight: 86.2 kg 88.2 kg 88.5 kg     - Neuro: Alert NAD  - Cardiovascular: Sinus  Drips: None.      - Pulm: Easy work of breathing.  Was on 7 L high flow    ABG    Component Value Date/Time   PHART 7.395 01/25/2021 1605   PCO2ART 34.1 01/25/2021 1605   PO2ART 71 (L) 01/25/2021 1605   HCO3 20.8 01/25/2021 1605   TCO2 22 01/25/2021 1605   ACIDBASEDEF 3.0 (H) 01/25/2021 1605   O2SAT 94.0 01/25/2021 1605    - Abd: Soft - Extremity: Warm  .Intake/Output      10/12 0701 10/13 0700 10/13 0701 10/14 0700   P.O. 200    I.V. (mL/kg) 891.9 (10.1) 3 (0)   Blood     IV Piggyback 676.8    Total Intake(mL/kg) 1768.8 (20) 3 (0)   Urine (mL/kg/hr) 1170 (0.6) 1735 (1.9)   Blood     Chest Tube 40    Total Output 1210 1735   Net +558.8 -1732           _______________________________________________________________ Labs: CBC Latest Ref Rng & Units 01/27/2021 01/26/2021 01/26/2021  WBC 4.0 - 10.5 K/uL 9.1 10.3 17.1(H)  Hemoglobin 13.0 - 17.0 g/dL 8.2(L) 8.7(L) 9.3(L)  Hematocrit 39.0 - 52.0 % 26.4(L) 27.3(L) 28.7(L)  Platelets 150 - 400 K/uL 94(L) 102(L) 142(L)   CMP Latest Ref Rng & Units 01/27/2021 01/26/2021 01/26/2021  Glucose 70 - 99 mg/dL 190(H) 179(H) 122(H)  BUN 8 -  23 mg/dL 14 13 12   Creatinine 0.61 - 1.24 mg/dL 1.15 1.14 0.93  Sodium 135 - 145 mmol/L 134(L) 133(L) 136  Potassium 3.5 - 5.1 mmol/L 4.3 4.2 3.9  Chloride 98 - 111 mmol/L 104 105 107  CO2 22 - 32 mmol/L 22 22 20(L)  Calcium 8.9 - 10.3 mg/dL 8.0(L) 7.7(L) 7.8(L)  Total Protein 6.5 - 8.1 g/dL - - -  Total Bilirubin 0.3 - 1.2 mg/dL - - -  Alkaline Phos 38 - 126 U/L - - -  AST 15 - 41 U/L - - -  ALT 0 - 44 U/L - - -    CXR: Small left effusion  _______________________________________________________________  Assessment and Plan: POD 2 s/p CABG with free mammary  Neuro: Pain well controlled CV: On Plavix.  Patient wires removed yesterday.  We will remove arterial line. Pulm: Continue pulmonary toilet with diuresis. Renal: Giving Lasix today. GI: On diet Heme: Stable ID: [email or web address] Endo: Sliding scale insulin Dispo: Floor once off high flow nasal cannula.   Lajuana Matte 01/27/2021 5:20 PM

## 2021-01-27 NOTE — Progress Notes (Signed)
      [address]       [place] [address]             [phone]     POD # 2 CABG x 2  Some incisional pain, no distress  BP (!) 96/51   Pulse 86   Temp 99.7 F (37.6 C)   Resp (!) 23   Ht 5\' 5"  (1.651 m)   Wt 88.5 kg   SpO2 94%   BMI 32.47 kg/m   5L [place] 94% sat   Intake/Output Summary (Last 24 hours) at 01/27/2021 1846 Last data filed at 01/27/2021 1400 Gross per 24 hour  Intake 1771.76 ml  Output 2765 ml  Net -993.24 ml    CBG elevated- only on a low dose of levemir + SSI  Norrine Ballester C. Roxan Hockey, MD [facility] [phone]

## 2021-01-27 NOTE — Progress Notes (Signed)
Inpatient Diabetes Program Recommendations  AACE/ADA: New Consensus Statement on Inpatient Glycemic Control (2015)  Target Ranges:  Prepandial:   less than 140 mg/dL      Peak postprandial:   less than 180 mg/dL (1-2 hours)      Critically ill patients:  140 - 180 mg/dL   Lab Results  Component Value Date   GLUCAP 258 (H) 01/27/2021   HGBA1C 8.8 (H) 01/21/2021    Review of Glycemic Control Results for Paul [name], Paul [name] (MRN [id number]) as of 01/27/2021 12:26  Ref. Range 01/26/2021 23:30 01/27/2021 05:11 01/27/2021 08:17 01/27/2021 11:53  Glucose-Capillary Latest Ref Range: 70 - 99 mg/dL 193 (H) 173 (H) 180 (H) 258 (H)   Diabetes history: Type 2 DM Outpatient Diabetes medications: Basaglar 30 units QD, Humalog 14 units TID,  Metformin 1000 mg BID Current orders for Inpatient glycemic control: Novolog 0-24 units Q4H, Levemir 15 units BID  Inpatient Diabetes Program Recommendations:    Consider: -Changing correction to Novolog 0-15 units TID & HS -Adding Novolog 4 units TID (Assuming patient consuming >50% of meals)  Thanks, Bronson Curb, MSN, RNC-OB Diabetes Coordinator [phone] (8a-5p)

## 2021-01-27 NOTE — Evaluation (Signed)
Physical Therapy Evaluation Patient Details Name: Paul [name] MRN: [id number] DOB: Sep 06, 1950 Today's Date: 01/27/2021  History of Present Illness  The pt is a 70 yo male presenting 10/11 for CABG x2 due to CAD. PMH includes: HTN, DM II, and obesity.  Clinical Impression  Pt in bed upon arrival of PT, agreeable to evaluation at this time. Prior to admission the pt was mobilizing with intermittent use of rollator at home, but reports many recent falls. He lives home alone, but states he was independent with ADLs and IADLs. The pt now presents with limitations in functional mobility, endurance, power, and dynamic stability due to above dx, and will continue to benefit from skilled PT to address these deficits. He was able to complete multiple sit-stand transfers from EOB, BSC, and recliner, but required increasing assist with progressive fatigue. He also was limited to a total of 45 ft and needed progressively increased assist to steady and increased frequency of standing rest breaks due to fatigue. Continue to recommend skilled PT acutely and following d/c as pt is at high risk for continued falls.         Recommendations for follow up therapy are one component of a multi-disciplinary discharge planning process, led by the attending physician.  Recommendations may be updated based on patient status, additional functional criteria and insurance authorization.  Follow Up Recommendations   CIR   Equipment Recommendations  Rolling walker with 5" wheels;3in1 (PT)    Recommendations for Other Services   Rehab Consult    Precautions / Restrictions Precautions Precautions: Fall Precaution Comments: pt reports frequent falls at home Restrictions Weight Bearing Restrictions: Yes Other Position/Activity Restrictions: sternal precautions      Mobility  Bed Mobility Overal bed mobility: Needs Assistance Bed Mobility: Rolling;Sidelying to Sit Rolling: Min assist Sidelying to sit: Min assist;HOB  elevated       General bed mobility comments: minA to complete rolling to maintain precautions, minA to elevate trunk with pt holding pillow    Transfers Overall transfer level: Needs assistance Equipment used: None Transfers: Sit to/from Stand Sit to Stand: Min assist;Mod assist         General transfer comment: minA of 2 initially, able to complete x2 with minA of 1, but progressed to modA of 1 with fatigue. posterior LOB when fatigued needing modA to complete transfer  Ambulation/Gait Ambulation/Gait assistance: Min assist;Mod assist Gait Distance (Feet): 10 Feet (+ 35 ft) Assistive device: Rolling walker (2 wheeled) Gait Pattern/deviations: Step-to pattern;Decreased stride length;Trunk flexed Gait velocity: decreased Gait velocity interpretation: <1.31 ft/sec, indicative of household ambulator General Gait Details: pt with progressively worsening trunk flexion, minA to steady and then pt needing cues for proximity to RW and posture.     Balance Overall balance assessment: Needs assistance;History of Falls Sitting-balance support: No upper extremity supported;Feet supported Sitting balance-Leahy Scale: Fair     Standing balance support: Bilateral upper extremity supported;During functional activity Standing balance-Leahy Scale: Poor Standing balance comment: mild instability with standing, benefits from BUE support for gait                             Pertinent Vitals/Pain Pain Assessment: Faces Faces Pain Scale: Hurts little more Pain Location: incision Pain Descriptors / Indicators: Discomfort Pain Intervention(s): Monitored during session;Repositioned    Home Living Family/patient expects to be discharged to:: Private residence Living Arrangements: Alone Available Help at Discharge: Family;Friend(s);Available PRN/intermittently Type of Home: House Home Access: Stairs to enter  Entrance Stairs-Rails: Right;Left Entrance Stairs-Number of Steps:  3-5 Home Layout: Multi-level;One level (bedroom is in an addition that has 2 steps down to bedroom without handrails) Home Equipment: Walker - 4 wheels;Tub bench Additional Comments: pt reports he uses furniture walking more than DME    Prior Function Level of Independence: Independent         Comments: pt reporting independence, has used rollator but not consistently. reports multiple falls in last few months, one that broke rollator. uses furniture and walls in the home. reports independent with driving, mediction management, and IADLs     Hand Dominance   Dominant Hand: Right    Extremity/Trunk Assessment   Upper Extremity Assessment Upper Extremity Assessment: Defer to OT evaluation    Lower Extremity Assessment Lower Extremity Assessment: Overall WFL for tasks assessed    Cervical / Trunk Assessment Cervical / Trunk Assessment: Other exceptions Cervical / Trunk Exceptions: s/p chest surgery, sternal precautions  Communication   Communication: No difficulties  Cognition Arousal/Alertness: Awake/alert Behavior During Therapy: WFL for tasks assessed/performed Overall Cognitive Status: Within Functional Limits for tasks assessed                                 General Comments: pt able to follow instructions given through session. good insight to exertion and verbalizing need for rest breaks      General Comments General comments (skin integrity, edema, etc.): VSS on 6L O2. SpO2 96 with gait    Exercises     Assessment/Plan    PT Assessment Patient needs continued PT services  PT Problem List Decreased strength;Decreased range of motion;Decreased activity tolerance;Decreased balance;Decreased mobility       PT Treatment Interventions DME instruction;Gait training;Stair training;Functional mobility training;Therapeutic activities;Therapeutic exercise;Balance training;Patient/family education    PT Goals (Current goals can be found in the Care Plan  section)  Acute Rehab PT Goals Patient Stated Goal: to get better and go home PT Goal Formulation: With patient Time For Goal Achievement: 02/10/21 Potential to Achieve Goals: Good    Frequency Min 3X/week   Barriers to discharge Decreased caregiver support pt from home alone, states he has family that can check on him intermittently at best    Co-evaluation PT/OT/SLP Co-Evaluation/Treatment: Yes Reason for Co-Treatment: For patient/therapist safety;To address functional/ADL transfers PT goals addressed during session: Mobility/safety with mobility;Balance;Proper use of DME         AM-PAC PT "6 Clicks" Mobility  Outcome Measure Help needed turning from your back to your side while in a flat bed without using bedrails?: A Little Help needed moving from lying on your back to sitting on the side of a flat bed without using bedrails?: A Little Help needed moving to and from a bed to a chair (including a wheelchair)?: A Little Help needed standing up from a chair using your arms (e.g., wheelchair or bedside chair)?: A Lot Help needed to walk in hospital room?: A Little Help needed climbing 3-5 steps with a railing? : A Lot 6 Click Score: 16    End of Session Equipment Utilized During Treatment: Gait belt;Oxygen Activity Tolerance: Patient tolerated treatment well;Patient limited by fatigue Patient left: in chair;with call bell/phone within reach Nurse Communication: Mobility status PT Visit Diagnosis: Other abnormalities of gait and mobility (R26.89);Repeated falls (R29.6)    Time: 9030-0923 PT Time Calculation (min) (ACUTE ONLY): 44 min   Charges:   PT Evaluation $PT Eval Moderate Complexity: 1 Mod  PT Treatments $Gait Training: 8-22 mins        West Carbo, PT, DPT   Acute Rehabilitation Department Pager #: [phone]  Sandra Cockayne 01/27/2021, 4:42 PM

## 2021-01-28 LAB — GLUCOSE, CAPILLARY
Glucose-Capillary: 154 mg/dL — ABNORMAL HIGH (ref 70–99)
Glucose-Capillary: 169 mg/dL — ABNORMAL HIGH (ref 70–99)
Glucose-Capillary: 105 mg/dL — ABNORMAL HIGH (ref 70–99)
Glucose-Capillary: 204 mg/dL — ABNORMAL HIGH (ref 70–99)
Glucose-Capillary: 161 mg/dL — ABNORMAL HIGH (ref 70–99)

## 2021-01-28 MED ORDER — SODIUM CHLORIDE 0.9% FLUSH
3.0000 mL | Freq: Two times a day (BID) | INTRAVENOUS | Status: DC
  Administered 2021-01-28: 3 mL via INTRAVENOUS

## 2021-01-28 MED ORDER — SODIUM CHLORIDE 0.9% FLUSH: 3.0000 mL | INTRAVENOUS | Status: DC | PRN

## 2021-01-28 MED ORDER — FE FUMARATE-B12-VIT C-FA-IFC PO CAPS
1.0000 | ORAL_CAPSULE | Freq: Two times a day (BID) | ORAL | Status: DC
  Administered 2021-01-28 – 2021-02-02 (×11): 1 via ORAL
  Filled 2021-01-28 (×12): qty 1

## 2021-01-28 MED ORDER — SODIUM CHLORIDE 0.9 % IV SOLN: 250.0000 mL | INTRAVENOUS | Status: DC | PRN

## 2021-01-28 MED ORDER — FUROSEMIDE 40 MG PO TABS
40.0000 mg | ORAL_TABLET | Freq: Every day | ORAL | Status: DC
  Administered 2021-01-28 – 2021-02-02 (×6): 40 mg via ORAL
  Filled 2021-01-28 (×6): qty 1

## 2021-01-28 MED ORDER — ALBUTEROL SULFATE (2.5 MG/3ML) 0.083% IN NEBU: 2.5000 mg | INHALATION_SOLUTION | Freq: Four times a day (QID) | RESPIRATORY_TRACT | Status: DC | PRN

## 2021-01-28 MED ORDER — METFORMIN HCL ER 500 MG PO TB24
1000.0000 mg | ORAL_TABLET | Freq: Every day | ORAL | Status: DC
  Administered 2021-01-29 – 2021-02-02 (×5): 1000 mg via ORAL
  Filled 2021-01-28 (×7): qty 2

## 2021-01-28 MED ORDER — ~~LOC~~ CARDIAC SURGERY, PATIENT & FAMILY EDUCATION: Freq: Once | Status: DC

## 2021-01-28 MED ORDER — TAMSULOSIN HCL 0.4 MG PO CAPS
0.4000 mg | ORAL_CAPSULE | Freq: Every day | ORAL | Status: DC
  Administered 2021-01-28 – 2021-02-02 (×6): 0.4 mg via ORAL
  Filled 2021-01-28 (×6): qty 1

## 2021-01-28 NOTE — Progress Notes (Signed)
Inpatient Rehabilitation Admissions Coordinator   Inpatient rehab consult received. I met with patient at bedside. We discussed goals and expectations of a possible CIR admit penidng bed available when he is medically ready. He was at CIR previously . I will follow up Monday.   Danne Baxter, RN, MSN Rehab Admissions Coordinator [phone] 01/28/2021 1:01 PM

## 2021-01-28 NOTE — Progress Notes (Signed)
Inpatient Rehab Admissions Coordinator:   Per therapy recommendations,  patient was screened for CIR candidacy by Clemens Catholic, MS, CCC-SLP. At this time, Pt. Appears to demonstrate medical necessity, functional decline, and ability to tolerate intensity of CIR. Pt. is a potential candidate for CIR. I will request   order for rehab consult per protocol for full assessment of candidacy. Please contact me any with questions.  Clemens Catholic, [place], [place] Admissions Coordinator  [phone] ([place]) [phone] (office)

## 2021-01-28 NOTE — Progress Notes (Addendum)
[address]       [place] [address]             [phone]      3 Days Post-Op Procedure(s) (LRB): CORONARY ARTERY BYPASS GRAFTING (CABG) X2, USING LEFT INTERNAL MAMMARY ARTERY AND LEFT LEG GREATER SAPHENOUS VEIN HARVESTED ENDOSCOPICALLY (N/A) TRANSESOPHAGEAL ECHOCARDIOGRAM (TEE) (N/A) ENDOVEIN HARVEST OF GREATER SAPHENOUS VEIN (Bilateral) APPLICATION OF CELL SAVER Subjective: Generally feels better, some incisional discomfort  Objective: Vital signs in last 24 hours: Temp:  [98.4 F (36.9 C)-100 F (37.8 C)] 99.4 F (37.4 C) (10/14 0645) Pulse Rate:  [79-96] 93 (10/14 0700) Cardiac Rhythm: Normal sinus rhythm (10/14 0400) Resp:  [14-29] 27 (10/14 0700) BP: (85-134)/(51-85) 121/77 (10/14 0700) SpO2:  [91 %-97 %] 94 % (10/14 0700) Weight:  [87 kg] 87 kg (10/14 0500)  Hemodynamic parameters for last 24 hours:    Intake/Output from previous day: 10/13 0701 - 10/14 0700 In: 222.7 [I.V.:222.7] Out: 2385 [Urine:2385] Intake/Output this shift: No intake/output data recorded.  General appearance: alert, cooperative, and no distress Heart: regular rate and rhythm Lungs: coarse exp BS Abdomen: mod distension, + BS, non tender Extremities: no edema Wound: evh site ok, chest dressing in place  Lab Results: Recent Labs    01/26/21 1652 01/27/21 0429  WBC 10.3 9.1  HGB 8.7* 8.2*  HCT 27.3* 26.4*  PLT 102* 94*   BMET:  Recent Labs    01/26/21 1652 01/27/21 0429  NA 133* 134*  K 4.2 4.3  CL 105 104  CO2 22 22  GLUCOSE 179* 190*  BUN 13 14  CREATININE 1.14 1.15  CALCIUM 7.7* 8.0*    PT/INR:  Recent Labs    01/25/21 1248  LABPROT 18.2*  INR 1.5*   ABG    Component Value Date/Time   PHART 7.395 01/25/2021 1605   HCO3 20.8 01/25/2021 1605   TCO2 22 01/25/2021 1605   ACIDBASEDEF 3.0 (H) 01/25/2021 1605   O2SAT 94.0 01/25/2021 1605   CBG (last 3)  Recent Labs    01/27/21 2350 01/28/21 0412 01/28/21 0629  GLUCAP 164* 154*  169*    Meds Scheduled Meds:  acetaminophen  1,000 mg Oral Q6H   allopurinol  100 mg Oral Daily   aspirin EC  81 mg Oral Daily   bisacodyl  10 mg Oral Daily   Or   bisacodyl  10 mg Rectal Daily   chlorhexidine gluconate (MEDLINE KIT)  15 mL Mouth Rinse BID   Chlorhexidine Gluconate Cloth  6 each Topical Q0600   clopidogrel  75 mg Oral Daily   [facility], Patient & Family Education   Does not apply Once   docusate sodium  200 mg Oral Daily   furosemide  40 mg Oral Daily   gabapentin  600 mg Oral BID   insulin aspart  0-24 Units Subcutaneous Q4H   insulin detemir  15 Units Subcutaneous BID   loratadine  10 mg Oral Daily   mouth rinse  15 mL Mouth Rinse q12n4p   metoprolol tartrate  12.5 mg Oral BID   Or   metoprolol tartrate  12.5 mg Per Tube BID   pantoprazole  40 mg Oral Daily   potassium chloride  40 mEq Oral Daily   rosuvastatin  20 mg Oral Daily   sodium chloride flush  3 mL Intravenous Q12H   sodium chloride flush  3 mL Intravenous Q12H   tamsulosin  0.4 mg Oral Daily  umeclidinium-vilanterol  1 puff Inhalation Daily   Continuous Infusions:  sodium chloride     sodium chloride     sodium chloride Stopped (01/28/21 0513)   sodium chloride     lactated ringers     lactated ringers Stopped (01/26/21 1612)   sodium chloride Stopped (01/26/21 0225)   PRN Meds:.sodium chloride, sodium chloride, albuterol, dextrose, diphenhydrAMINE, lip balm, metoprolol tartrate, midazolam, morphine injection, oxyCODONE, [COMPLETED] albumin human **AND** sodium chloride, sodium chloride flush, sodium chloride flush  Xrays DG Chest Port 1 View  Result Date: 01/27/2021 CLINICAL DATA:  Chest tube present, pneumothorax, open heart surgery EXAM: PORTABLE CHEST 1 VIEW COMPARISON:  01/26/2021 FINDINGS: Unchanged chest tube, probable mediastinal drain, and right IJ central line. No pneumothorax. Persistent left pleural effusion and bibasilar atelectasis/consolidation. Similar  cardiomediastinal contours. IMPRESSION: No substantial change. No pneumothorax. Persistent layering left pleural effusion and bibasilar atelectasis/consolidation. Electronically Signed   By: Macy Mis M.D.   On: 01/27/2021 08:18    Assessment/Plan: S/P Procedure(s) (LRB): CORONARY ARTERY BYPASS GRAFTING (CABG) X2, USING LEFT INTERNAL MAMMARY ARTERY AND LEFT LEG GREATER SAPHENOUS VEIN HARVESTED ENDOSCOPICALLY (N/A) TRANSESOPHAGEAL ECHOCARDIOGRAM (TEE) (N/A) ENDOVEIN HARVEST OF GREATER SAPHENOUS VEIN (Bilateral) APPLICATION OF CELL SAVER  1 Tmax 100, s BP 85-134 range, no inotropes or pressors 2 sats good on 2 liters, cont nebs 3 anemia slow downward trend- not in transfusion threshold, monitor, add trinsicon 4 good uop but did require I/O cath x 1starting flomax- monitor 5 normal renal fxn, lasix ordered 6 thrombocytopenia trend worsening, on plavix and asa- monitor,not on lovenox 7 BS fair control- transition to home meds over time, will add glucophage back today 8 no new CXR- will obtain for tomorrow 9 placed CIR consult as therapists are recommending 10 pulm toilet, add flutter valve 11 ileus- monitor, slowly improving bit is passing flatus and had small bm   LOS: 3 days    John Giovanni PA-C Pager [phone] 01/28/2021   Doing well Will transfer to floor PT consult  [place]

## 2021-01-29 ENCOUNTER — Inpatient Hospital Stay (HOSPITAL_COMMUNITY): Payer: Medicare Other

## 2021-01-29 LAB — BASIC METABOLIC PANEL
Anion gap: 9 (ref 5–15)
BUN: 23 mg/dL (ref 8–23)
CO2: 23 mmol/L (ref 22–32)
Calcium: 8.3 mg/dL — ABNORMAL LOW (ref 8.9–10.3)
Chloride: 103 mmol/L (ref 98–111)
Creatinine, Ser: 1.2 mg/dL (ref 0.61–1.24)
GFR, Estimated: >60 mL/min (ref >60)
Glucose, Bld: 155 mg/dL — ABNORMAL HIGH (ref 70–99)
Potassium: 4.5 mmol/L (ref 3.5–5.1)
Sodium: 135 mmol/L (ref 135–145)

## 2021-01-29 LAB — CBC
HCT: 25.5 % — ABNORMAL LOW (ref 39.0–52.0)
Hemoglobin: 8 g/dL — ABNORMAL LOW (ref 13.0–17.0)
MCH: 29.3 pg (ref 26.0–34.0)
MCHC: 31.4 g/dL (ref 30.0–36.0)
MCV: 93.4 fL (ref 80.0–100.0)
Platelets: 127 10*3/uL — ABNORMAL LOW (ref 150–400)
RBC: 2.73 MIL/uL — ABNORMAL LOW (ref 4.22–5.81)
RDW: 15 % (ref 11.5–15.5)
WBC: 7.1 10*3/uL (ref 4.0–10.5)
nRBC: 0.3 % — ABNORMAL HIGH (ref 0.0–0.2)

## 2021-01-29 LAB — GLUCOSE, CAPILLARY
Glucose-Capillary: 142 mg/dL — ABNORMAL HIGH (ref 70–99)
Glucose-Capillary: 143 mg/dL — ABNORMAL HIGH (ref 70–99)
Glucose-Capillary: 146 mg/dL — ABNORMAL HIGH (ref 70–99)
Glucose-Capillary: 148 mg/dL — ABNORMAL HIGH (ref 70–99)
Glucose-Capillary: 165 mg/dL — ABNORMAL HIGH (ref 70–99)
Glucose-Capillary: 189 mg/dL — ABNORMAL HIGH (ref 70–99)

## 2021-01-29 NOTE — Progress Notes (Addendum)
[address]       [place] [address]             [phone]      4 Days Post-Op Procedure(s) (LRB): CORONARY ARTERY BYPASS GRAFTING (CABG) X2, USING LEFT INTERNAL MAMMARY ARTERY AND LEFT LEG GREATER SAPHENOUS VEIN HARVESTED ENDOSCOPICALLY (N/A) TRANSESOPHAGEAL ECHOCARDIOGRAM (TEE) (N/A) ENDOVEIN HARVEST OF GREATER SAPHENOUS VEIN (Bilateral) APPLICATION OF CELL SAVER Subjective: Conts to feel better  Objective: Vital signs in last 24 hours: Temp:  [98 F (36.7 C)-99.4 F (37.4 C)] 98 F (36.7 C) (10/15 0829) Pulse Rate:  [78-101] 101 (10/15 0829) Cardiac Rhythm: Sinus tachycardia (10/15 0700) Resp:  [15-24] 18 (10/15 0829) BP: (106-134)/(57-73) 134/64 (10/15 0829) SpO2:  [92 %-96 %] 94 % (10/15 0859) Weight:  [86.2 kg] 86.2 kg (10/15 0323)  Hemodynamic parameters for last 24 hours:    Intake/Output from previous day: 10/14 0701 - 10/15 0700 In: 316.3 [P.O.:300; I.V.:16.3] Out: 700 [Urine:700] Intake/Output this shift: No intake/output data recorded.  General appearance: alert, cooperative, and no distress Heart: regular rate and rhythm Lungs: clear to auscultation bilaterally Abdomen: mod distension + BS, non tender Extremities: no edema Wound: incis healing well  Lab Results: Recent Labs    01/27/21 0429 01/29/21 0137  WBC 9.1 7.1  HGB 8.2* 8.0*  HCT 26.4* 25.5*  PLT 94* 127*   BMET:  Recent Labs    01/27/21 0429 01/29/21 0137  NA 134* 135  K 4.3 4.5  CL 104 103  CO2 22 23  GLUCOSE 190* 155*  BUN 14 23  CREATININE 1.15 1.20  CALCIUM 8.0* 8.3*    PT/INR: No results for input(s): LABPROT, INR in the last 72 hours. ABG    Component Value Date/Time   PHART 7.395 01/25/2021 1605   HCO3 20.8 01/25/2021 1605   TCO2 22 01/25/2021 1605   ACIDBASEDEF 3.0 (H) 01/25/2021 1605   O2SAT 94.0 01/25/2021 1605   CBG (last 3)  Recent Labs    01/29/21 0042 01/29/21 0408 01/29/21 0851  GLUCAP 143* 148* 165*    Meds Scheduled  Meds:  acetaminophen  1,000 mg Oral Q6H   allopurinol  100 mg Oral Daily   aspirin EC  81 mg Oral Daily   bisacodyl  10 mg Oral Daily   Or   bisacodyl  10 mg Rectal Daily   chlorhexidine gluconate (MEDLINE KIT)  15 mL Mouth Rinse BID   Chlorhexidine Gluconate Cloth  6 each Topical Q0600   clopidogrel  75 mg Oral Daily   [facility], Patient & Family Education   Does not apply Once   docusate sodium  200 mg Oral Daily   ferrous [id number] C-folic acid  1 capsule Oral BID PC   furosemide  40 mg Oral Daily   gabapentin  600 mg Oral BID   insulin aspart  0-24 Units Subcutaneous Q4H   insulin detemir  15 Units Subcutaneous BID   loratadine  10 mg Oral Daily   mouth rinse  15 mL Mouth Rinse q12n4p   metFORMIN  1,000 mg Oral Q breakfast   metoprolol tartrate  12.5 mg Oral BID   Or   metoprolol tartrate  12.5 mg Per Tube BID   pantoprazole  40 mg Oral Daily   potassium chloride  40 mEq Oral Daily   rosuvastatin  20 mg Oral Daily   sodium chloride flush  3 mL Intravenous Q12H   sodium chloride flush  3 mL  Intravenous Q12H   tamsulosin  0.4 mg Oral Daily   umeclidinium-vilanterol  1 puff Inhalation Daily   Continuous Infusions:  sodium chloride     sodium chloride     sodium chloride Stopped (01/28/21 0820)   sodium chloride     lactated ringers     lactated ringers Stopped (01/26/21 1612)   sodium chloride Stopped (01/26/21 0225)   PRN Meds:.sodium chloride, sodium chloride, albuterol, dextrose, diphenhydrAMINE, lip balm, metoprolol tartrate, midazolam, morphine injection, oxyCODONE, [COMPLETED] albumin human **AND** sodium chloride, sodium chloride flush, sodium chloride flush  Xrays DG Chest 2 View  Result Date: 01/29/2021 CLINICAL DATA:  CABG, follow-up. EXAM: CHEST - 2 VIEW COMPARISON:  Chest x-rays dated 01/27/2021 and 01/26/2021. FINDINGS: RIGHT IJ central line is been removed in the interval. Heart size and mediastinal contours are stable. Lungs  are now clear. No pleural effusion or pneumothorax is seen. Median sternotomy wires appear intact and stable in alignment. IMPRESSION: No active cardiopulmonary disease. Lungs are now clear. Electronically Signed   By: Stan  Maynard M.D.   On: 01/29/2021 08:33    Assessment/Plan: S/P Procedure(s) (LRB): CORONARY ARTERY BYPASS GRAFTING (CABG) X2, USING LEFT INTERNAL MAMMARY ARTERY AND LEFT LEG GREATER SAPHENOUS VEIN HARVESTED ENDOSCOPICALLY (N/A) TRANSESOPHAGEAL ECHOCARDIOGRAM (TEE) (N/A) ENDOVEIN HARVEST OF GREATER SAPHENOUS VEIN (Bilateral) APPLICATION OF CELL SAVER  1 afeb, VSS sBP 106-134, SR/ST. On ASA/plavix, BB, crestor, no ACE/ARB currently with BP well controlled 2 sats good on 2 liters, conts nebs, wean as able 3 UOP appears adeq, some unmeasured- now on flomax, on daily lasix 4 thrombocytopenia trend improved 5 anemia is fairly stable 6 renal fxn fairly stable- in normal range 7 has been accepted to CIR when medically ready- cont therapies 8 CXR- No active cardiopulmonary disease. Lungs are now clear 9 BS adeq controlled, transition to home meds over time 10 ileus- slowly resolving  LOS: 4 days    Wayne E Gold PA-C Pager [phone] 01/29/2021  Agree with above Doing well Continue ambulation and pulm hygiene Approved for CIR on Monday   O     

## 2021-01-29 NOTE — Progress Notes (Signed)
Physical Therapy Treatment Patient Details Name: Paul [name] MRN: [id number] DOB: 02/03/1951 Today's Date: 01/29/2021   History of Present Illness The pt is a 70 yo male presenting 10/11 for CABG x2 due to CAD. Ileus. PMH includes: HTN, DM II, and obesity.    PT Comments    Pt is making good progress with mobility, ambulating an increased distance of up to ~125 ft with a RW. However, he required up to minA to maintain his balance when ambulating, displaying fatigue quickly. In addition, he continues to demonstrate lower extremity weakness through requiring up to modA to power up to stand without using his arms to maintain sternal precautions. Reviewed sternal precautions with fair recall noted. Will continue to follow acutely. Current recommendations remain appropriate.     Recommendations for follow up therapy are one component of a multi-disciplinary discharge planning process, led by the attending physician.  Recommendations may be updated based on patient status, additional functional criteria and insurance authorization.  Follow Up Recommendations  CIR     Equipment Recommendations  Rolling walker with 5" wheels;3in1 (PT)    Recommendations for Other Services       Precautions / Restrictions Precautions Precautions: Sternal;Fall Precaution Booklet Issued: No Precaution Comments: pt reports frequent falls at home, urinary urgency, reviewed sternal precautions Restrictions Weight Bearing Restrictions: Yes Other Position/Activity Restrictions: sternal precautions     Mobility  Bed Mobility Overal bed mobility: Needs Assistance Bed Mobility: Supine to Sit;Sit to Supine     Supine to sit: Min assist;HOB elevated Sit to supine: Min assist;HOB elevated   General bed mobility comments: MinA to ascend trunk to sit up EOB, pt initiating managing legs off EOB. MinA to manage legs back to supine.    Transfers Overall transfer level: Needs assistance Equipment used: Rolling  walker (2 wheeled);2 person hand held assist Transfers: Sit to/from [facility] Sit to Stand: Mod assist Stand pivot transfers: Min assist       General transfer comment: Cues provided to place hands on lap with transfers to maintain sternal precautions, several attempts to be successful with up to modA to power up to stand from EOB or commode several reps each. MinA to steady with stand step bed > commode with HHA.  Ambulation/Gait Ambulation/Gait assistance: Min assist;Min guard Gait Distance (Feet): 125 Feet Assistive device: Rolling walker (2 wheeled) Gait Pattern/deviations: Step-through pattern;Decreased stride length;Shuffle;Trunk flexed Gait velocity: decreased Gait velocity interpretation: <1.31 ft/sec, indicative of household ambulator General Gait Details: Pt with slow, mildly unsteady, shuffling gait with trunk flexed due to sternal pain. No LOB, min guard-minA for stability due to balance deficits noted, esp when turning or negotiating tight spaces.   Stairs             Wheelchair Mobility    Modified Rankin (Stroke Patients Only)       Balance Overall balance assessment: Needs assistance;History of Falls Sitting-balance support: No upper extremity supported;Feet supported Sitting balance-Leahy Scale: Fair     Standing balance support: Bilateral upper extremity supported;During functional activity;No upper extremity supported Standing balance-Leahy Scale: Fair Standing balance comment: Able to stand statically with wide BOS briefly without UE support but needs minA to maintain. Bil UE support for mobility with min guard-minA.                            Cognition Arousal/Alertness: Awake/alert Behavior During Therapy: WFL for tasks assessed/performed Overall Cognitive Status: Within Functional Limits for tasks assessed  General Comments: Pt needing cues to maintain sternal  precautions      Exercises      General Comments        Pertinent Vitals/Pain Pain Assessment: Faces Faces Pain Scale: Hurts even more Pain Location: sternum Pain Descriptors / Indicators: Discomfort;Operative site guarding;Grimacing Pain Intervention(s): Limited activity within patient's tolerance;Monitored during session;Repositioned;Patient requesting pain meds-RN notified    Home Living                      Prior Function            PT Goals (current goals can now be found in the care plan section) Acute Rehab PT Goals Patient Stated Goal: to get better PT Goal Formulation: With patient Time For Goal Achievement: 02/10/21 Potential to Achieve Goals: Good Progress towards PT goals: Progressing toward goals    Frequency    Min 3X/week      PT Plan Current plan remains appropriate    Co-evaluation              AM-PAC PT "6 Clicks" Mobility   Outcome Measure  Help needed turning from your back to your side while in a flat bed without using bedrails?: A Little Help needed moving from lying on your back to sitting on the side of a flat bed without using bedrails?: A Little Help needed moving to and from a bed to a chair (including a wheelchair)?: A Little Help needed standing up from a chair using your arms (e.g., wheelchair or bedside chair)?: A Lot Help needed to walk in hospital room?: A Little Help needed climbing 3-5 steps with a railing? : A Lot 6 Click Score: 16    End of Session Equipment Utilized During Treatment: Oxygen Activity Tolerance: Patient limited by pain;Patient limited by fatigue Patient left: in bed;with call bell/phone within reach;with bed alarm set;with nursing/sitter in room Nurse Communication: Mobility status;Patient requests pain meds;Other (comment) (organe-brown liquid stool, notified NT) PT Visit Diagnosis: Other abnormalities of gait and mobility (R26.89);Repeated falls (R29.6);Unsteadiness on feet  (R26.81);Difficulty in walking, not elsewhere classified (R26.2);Muscle weakness (generalized) (M62.81);Pain Pain - part of body:  (sternum)     Time: 3825-0539 PT Time Calculation (min) (ACUTE ONLY): 39 min  Charges:  $Gait Training: 8-22 mins $Therapeutic Activity: 23-37 mins                     Moishe Spice, PT, DPT Acute Rehabilitation Services  Pager: [phone] Office: [phone]    Orvan Falconer 01/29/2021, 4:43 PM

## 2021-01-29 NOTE — Progress Notes (Signed)
CARDIAC REHAB PHASE I   PRE:  Rate/Rhythm: 101 ST  BP:  Sitting: 134/64      SaO2: 95 2L  MODE:  Ambulation: 150 ft   POST:  Rate/Rhythm: 116 ST  BP:  Sitting: 121/63    SaO2: 95 2L   Pt need heavy assist to EOB, and ax1 to rise from seated position. Pt ambulated 147ft in hallway standby assist with EVA. Pt declined to increase distance. Pt returned to recliner. Encouraged continued ambulation and IS use. Will continue to follow.  [id number] Rufina Falco, RN BSN 01/29/2021 9:21 AM

## 2021-01-30 ENCOUNTER — Inpatient Hospital Stay (HOSPITAL_COMMUNITY): Payer: Medicare Other

## 2021-01-30 LAB — GLUCOSE, CAPILLARY
Glucose-Capillary: 158 mg/dL — ABNORMAL HIGH (ref 70–99)
Glucose-Capillary: 188 mg/dL — ABNORMAL HIGH (ref 70–99)
Glucose-Capillary: 165 mg/dL — ABNORMAL HIGH (ref 70–99)
Glucose-Capillary: 149 mg/dL — ABNORMAL HIGH (ref 70–99)

## 2021-01-30 NOTE — Progress Notes (Signed)
Mobility Specialist Progress Note:   01/30/21 1020  Therapy Vitals  Pulse Rate (!) 104  BP 132/69  Oxygen Therapy  SpO2 94 %  Mobility  Activity Ambulated in hall  Level of Assistance Contact guard assist, steadying assist  Assistive Device Front wheel walker  Distance Ambulated (ft) 220 ft  Mobility Ambulated with assistance in hallway  Mobility Response Tolerated well  Mobility performed by Mobility specialist  Bed Position Chair  $Mobility charge 1 Mobility   Pre Mobility: HR 104 bpm; BP 132/69; SpO2 94% During Mobility: SpO2 86-96% Post Mobility: HR 107 bpm; BP 154/72; SpO2 96%  Pt received struggling to lay back down from sitting EOB. Ambulated 220' on 2LO2 with RW. Required multiple standing breaks d/t SOB and fatigue. Pt desat to 86% during ambulation, yet quickly recovered with pursed lip breathing. Pt left in chair with all needs met.   Nelta Numbers Mobility Specialist  Phone [phone]

## 2021-01-30 NOTE — Progress Notes (Signed)
Paul [name] Pt found down on floor after family members alerted staff that pt needed help. Pt reports that he was eating candy and started choking. From there he said he passed out and fell face first on the floor. Vital signs were checked and stable. BP 120/60 HR 92. Pt was alert and able to answer questions appropriately. Pt has a laceration on his nose and has a contusion in the center of his forehead. Dr Kipp Brood was made aware and gave verbal orders for a CT of the head. Pt safely transported back into the bed. Pt denies pain and states that he is ok. Will continue to monitor.   Paul [name]

## 2021-01-30 NOTE — Progress Notes (Addendum)
[address]       [place] [address]             [phone]      5 Days Post-Op Procedure(s) (LRB): CORONARY ARTERY BYPASS GRAFTING (CABG) X2, USING LEFT INTERNAL MAMMARY ARTERY AND LEFT LEG GREATER SAPHENOUS VEIN HARVESTED ENDOSCOPICALLY (N/A) TRANSESOPHAGEAL ECHOCARDIOGRAM (TEE) (N/A) ENDOVEIN HARVEST OF GREATER SAPHENOUS VEIN (Bilateral) APPLICATION OF CELL SAVER Subjective: Feels ok, some incisional discomforts  Objective: Vital signs in last 24 hours: Temp:  [98.1 F (36.7 C)-98.8 F (37.1 C)] 98.1 F (36.7 C) (10/16 0756) Pulse Rate:  [86-100] 91 (10/16 0756) Cardiac Rhythm: Normal sinus rhythm (10/16 0700) Resp:  [18-20] 20 (10/16 0756) BP: (110-129)/(59-70) 121/61 (10/16 0756) SpO2:  [94 %-96 %] 95 % (10/16 0858) Weight:  [85.5 kg] 85.5 kg (10/16 0529)  Hemodynamic parameters for last 24 hours:    Intake/Output from previous day: 10/15 0701 - 10/16 0700 In: 0  Out: 1700 [Urine:1700] Intake/Output this shift: No intake/output data recorded.  General appearance: alert, cooperative, and no distress Heart: regular rate and rhythm Lungs: mildly dim in bases Abdomen: mod distension, + BS, non tender Extremities: no edema Wound: healing well  Lab Results: Recent Labs    01/29/21 0137  WBC 7.1  HGB 8.0*  HCT 25.5*  PLT 127*   BMET:  Recent Labs    01/29/21 0137  NA 135  K 4.5  CL 103  CO2 23  GLUCOSE 155*  BUN 23  CREATININE 1.20  CALCIUM 8.3*    PT/INR: No results for input(s): LABPROT, INR in the last 72 hours. ABG    Component Value Date/Time   PHART 7.395 01/25/2021 1605   HCO3 20.8 01/25/2021 1605   TCO2 22 01/25/2021 1605   ACIDBASEDEF 3.0 (H) 01/25/2021 1605   O2SAT 94.0 01/25/2021 1605   CBG (last 3)  Recent Labs    01/29/21 1632 01/29/21 2040 01/30/21 0557  GLUCAP 146* 142* 158*    Meds Scheduled Meds:  acetaminophen  1,000 mg Oral Q6H   allopurinol  100 mg Oral Daily   aspirin EC  81 mg Oral  Daily   bisacodyl  10 mg Oral Daily   Or   bisacodyl  10 mg Rectal Daily   chlorhexidine gluconate (MEDLINE KIT)  15 mL Mouth Rinse BID   Chlorhexidine Gluconate Cloth  6 each Topical Q0600   clopidogrel  75 mg Oral Daily   [facility], Patient & Family Education   Does not apply Once   docusate sodium  200 mg Oral Daily   ferrous [id number] C-folic acid  1 capsule Oral BID PC   furosemide  40 mg Oral Daily   gabapentin  600 mg Oral BID   insulin aspart  0-24 Units Subcutaneous Q4H   insulin detemir  15 Units Subcutaneous BID   loratadine  10 mg Oral Daily   mouth rinse  15 mL Mouth Rinse q12n4p   metFORMIN  1,000 mg Oral Q breakfast   metoprolol tartrate  12.5 mg Oral BID   Or   metoprolol tartrate  12.5 mg Per Tube BID   pantoprazole  40 mg Oral Daily   potassium chloride  40 mEq Oral Daily   rosuvastatin  20 mg Oral Daily   tamsulosin  0.4 mg Oral Daily   umeclidinium-vilanterol  1 puff Inhalation Daily   Continuous Infusions:  sodium chloride     sodium chloride     sodium  chloride Stopped (01/28/21 0820)   lactated ringers     lactated ringers Stopped (01/26/21 1612)   sodium chloride Stopped (01/26/21 0225)   PRN Meds:.sodium chloride, albuterol, dextrose, diphenhydrAMINE, lip balm, metoprolol tartrate, midazolam, morphine injection, oxyCODONE, [COMPLETED] albumin human **AND** sodium chloride  Xrays DG Chest 2 View  Result Date: 01/29/2021 CLINICAL DATA:  CABG, follow-up. EXAM: CHEST - 2 VIEW COMPARISON:  Chest x-rays dated 01/27/2021 and 01/26/2021. FINDINGS: RIGHT IJ central line is been removed in the interval. Heart size and mediastinal contours are stable. Lungs are now clear. No pleural effusion or pneumothorax is seen. Median sternotomy wires appear intact and stable in alignment. IMPRESSION: No active cardiopulmonary disease. Lungs are now clear. Electronically Signed   By: Franki Cabot M.D.   On: 01/29/2021 08:33     Assessment/Plan: S/P Procedure(s) (LRB): CORONARY ARTERY BYPASS GRAFTING (CABG) X2, USING LEFT INTERNAL MAMMARY ARTERY AND LEFT LEG GREATER SAPHENOUS VEIN HARVESTED ENDOSCOPICALLY (N/A) TRANSESOPHAGEAL ECHOCARDIOGRAM (TEE) (N/A) ENDOVEIN HARVEST OF GREATER SAPHENOUS VEIN (Bilateral) APPLICATION OF CELL SAVER  1 afeb, VSS sinus rhythm- on ASA/Plavix, crestor, No ARB/ACE-I with BP at current control, don't want hypotension 2 sats good on 2 liters 3 good UOP , weight stable, cont lasix for now 4 no new labs 5 CBG ok- home meds at transfer if appetite ok 6 CIR hopefully tomorrow 7 ileus slowly improving, having flatus and BM's    LOS: 5 days    John Giovanni PA-C Pager [phone] 01/30/2021    Agree with above Continue PT CIR tomorrow  Lajuana Matte

## 2021-01-31 LAB — GLUCOSE, CAPILLARY
Glucose-Capillary: 146 mg/dL — ABNORMAL HIGH (ref 70–99)
Glucose-Capillary: 219 mg/dL — ABNORMAL HIGH (ref 70–99)
Glucose-Capillary: 181 mg/dL — ABNORMAL HIGH (ref 70–99)
Glucose-Capillary: 159 mg/dL — ABNORMAL HIGH (ref 70–99)
Glucose-Capillary: 163 mg/dL — ABNORMAL HIGH (ref 70–99)

## 2021-01-31 MED ORDER — DM-GUAIFENESIN ER 30-600 MG PO TB12
1.0000 | ORAL_TABLET | Freq: Two times a day (BID) | ORAL | Status: DC
  Administered 2021-01-31 – 2021-02-02 (×5): 1 via ORAL
  Filled 2021-01-31 (×5): qty 1

## 2021-01-31 NOTE — Progress Notes (Addendum)
Inpatient Rehabilitation Admissions Coordinator   I met at bedside with patient and his brother, Ulice Dash. Patient does not have the caregiver supports at home after a CIR admit. They would like to purse SNF, maybe [facility]. I have alerted acute team and TOC.We will sign off at this time.  Danne Baxter, RN, MSN Rehab Admissions Coordinator [phone] 01/31/2021 11:09 AM

## 2021-01-31 NOTE — Progress Notes (Signed)
CARDIAC REHAB PHASE I   Offered to walk with pt. Pt recently back to bed because he "felt like he felt yesterday" before he fell. Bed alarm on, call bell and bedside table within reach. Will f/u later as appropriate.  Rufina Falco, RN BSN 01/31/2021 10:04 AM

## 2021-01-31 NOTE — Progress Notes (Signed)
CARDIAC REHAB PHASE I   Returned several more times throughout the day to offer ambulation. Pt states fatigue and pain. Tried to encourage OOB for lunch, pt declines at this time. RN made aware. Will continue to follow and encourage ambulation as able.  Rufina Falco, RN BSN 01/31/2021 2:56 PM

## 2021-01-31 NOTE — Progress Notes (Addendum)
[address]       [place] [address]             [phone]      6 Days Post-Op Procedure(s) (LRB): CORONARY ARTERY BYPASS GRAFTING (CABG) X2, USING LEFT INTERNAL MAMMARY ARTERY AND LEFT LEG GREATER SAPHENOUS VEIN HARVESTED ENDOSCOPICALLY (N/A) TRANSESOPHAGEAL ECHOCARDIOGRAM (TEE) (N/A) ENDOVEIN HARVEST OF GREATER SAPHENOUS VEIN (Bilateral) APPLICATION OF CELL SAVER Subjective: Feels ok , minor skin lac on nose  Objective: Vital signs in last 24 hours: Temp:  [98.1 F (36.7 C)-99 F (37.2 C)] 98.5 F (36.9 C) (10/17 0728) Pulse Rate:  [84-105] 96 (10/17 0728) Cardiac Rhythm: Normal sinus rhythm (10/16 1915) Resp:  [14-20] 14 (10/17 0728) BP: (104-139)/(58-70) 130/61 (10/17 0728) SpO2:  [92 %-97 %] 97 % (10/17 0728) Weight:  [87.7 kg] 87.7 kg (10/17 0332)  Hemodynamic parameters for last 24 hours:    Intake/Output from previous day: 10/16 0701 - 10/17 0700 In: 240 [P.O.:240] Out: 1740 [Urine:1740] Intake/Output this shift: No intake/output data recorded.  General appearance: alert, cooperative, and no distress Heart: regular rate and rhythm Lungs: fair air exchange, no wheezing Abdomen: mod distension, stable exam Extremities: no edema, redness or tenderness in the calves or thighs Wound: incis healing well ( He does have trace LE Edema) Lab Results: Recent Labs    01/29/21 0137  WBC 7.1  HGB 8.0*  HCT 25.5*  PLT 127*   BMET:  Recent Labs    01/29/21 0137  NA 135  K 4.5  CL 103  CO2 23  GLUCOSE 155*  BUN 23  CREATININE 1.20  CALCIUM 8.3*    PT/INR: No results for input(s): LABPROT, INR in the last 72 hours. ABG    Component Value Date/Time   PHART 7.395 01/25/2021 1605   HCO3 20.8 01/25/2021 1605   TCO2 22 01/25/2021 1605   ACIDBASEDEF 3.0 (H) 01/25/2021 1605   O2SAT 94.0 01/25/2021 1605   CBG (last 3)  Recent Labs    01/30/21 1640 01/30/21 2125 01/31/21 0620  GLUCAP 165* 149* 146*    Meds Scheduled Meds:   allopurinol  100 mg Oral Daily   aspirin EC  81 mg Oral Daily   bisacodyl  10 mg Oral Daily   Or   bisacodyl  10 mg Rectal Daily   chlorhexidine gluconate (MEDLINE KIT)  15 mL Mouth Rinse BID   Chlorhexidine Gluconate Cloth  6 each Topical Q0600   clopidogrel  75 mg Oral Daily   [place] [facility], Patient & Family Education   Does not apply Once   docusate sodium  200 mg Oral Daily   ferrous [id number] C-folic acid  1 capsule Oral BID PC   furosemide  40 mg Oral Daily   gabapentin  600 mg Oral BID   insulin aspart  0-24 Units Subcutaneous Q4H   insulin detemir  15 Units Subcutaneous BID   loratadine  10 mg Oral Daily   mouth rinse  15 mL Mouth Rinse q12n4p   metFORMIN  1,000 mg Oral Q breakfast   metoprolol tartrate  12.5 mg Oral BID   Or   metoprolol tartrate  12.5 mg Per Tube BID   pantoprazole  40 mg Oral Daily   potassium chloride  40 mEq Oral Daily   rosuvastatin  20 mg Oral Daily   tamsulosin  0.4 mg Oral Daily   umeclidinium-vilanterol  1 puff Inhalation Daily   Continuous Infusions:  sodium chloride  sodium chloride     sodium chloride Stopped (01/28/21 0820)   lactated ringers     lactated ringers Stopped (01/26/21 1612)   sodium chloride Stopped (01/26/21 0225)   PRN Meds:.sodium chloride, albuterol, dextrose, diphenhydrAMINE, lip balm, metoprolol tartrate, midazolam, morphine injection, oxyCODONE, [COMPLETED] albumin human **AND** sodium chloride  Xrays CT HEAD WO CONTRAST (5MM)  Result Date: 01/30/2021 CLINICAL DATA:  Facial trauma EXAM: CT HEAD WITHOUT CONTRAST TECHNIQUE: Contiguous axial images were obtained from the base of the skull through the vertex without intravenous contrast. COMPARISON:  None. FINDINGS: Brain: No evidence of acute infarction, hemorrhage, hydrocephalus, extra-axial collection or mass lesion/mass effect. Mild for age patchy white matter hypodensities, nonspecific but compatible with chronic microvascular ischemic  disease. Mild for age generalized atrophy with ex vacuo ventricular dilation. Vascular: No hyperdense vessel identified. Calcific intracranial atherosclerosis. Skull: No acute fracture.  Central forehead scalp contusion. Sinuses/Orbits: Mild paranasal sinus mucosal thickening without visible air-fluid level. Unremarkable orbits. Other: No mastoid effusions. IMPRESSION: 1. No evidence of acute intracranial abnormality. 2. Central forehead scalp contusion without acute fracture. 3. Mild for age chronic microvascular disease and atrophy. Electronically Signed   By: Margaretha Sheffield M.D.   On: 01/30/2021 19:09    Assessment/Plan: S/P Procedure(s) (LRB): CORONARY ARTERY BYPASS GRAFTING (CABG) X2, USING LEFT INTERNAL MAMMARY ARTERY AND LEFT LEG GREATER SAPHENOUS VEIN HARVESTED ENDOSCOPICALLY (N/A) TRANSESOPHAGEAL ECHOCARDIOGRAM (TEE) (N/A) ENDOVEIN HARVEST OF GREATER SAPHENOUS VEIN (Bilateral) APPLICATION OF CELL SAVER  1 Tmax 99, VSS- conts current tx 2 sats good on RA, but currently on O2 3 CT neg 4 Good UOP, weight fairly stable 5 BS ok control- home meds at transfer  6 having BM and flatus 7 poss CIR when bed available     LOS: 6 days    John Giovanni PA-C Pager [phone] 01/31/2021    Agree with above CT head negative Dispo planning.  Ted Leonhart Bary Leriche

## 2021-01-31 NOTE — Progress Notes (Signed)
Mobility Specialist Progress Note:   01/31/21 1155  Therapy Vitals  Pulse Rate 89  Mobility  Activity Ambulated in hall  Level of Assistance Minimal assist, patient does 75% or more  Assistive Device Front wheel walker  Distance Ambulated (ft) 315 ft  Mobility Ambulated with assistance in hallway  Mobility Response Tolerated fair  Mobility performed by Mobility specialist  $Mobility charge 1 Mobility   Pre Mobility: HR 89 bpm; BP 110/64; SpO2 94% During Mobility: SpO2 92-96% Post Mobility: HR 95 bpm; BP 123/62; SpO2 94%  Pt received in bed, agreed to mobility. Required minA to stand from EOB. Ambulated in hallway 315' on 4LO2, with RW and contactG. Pt SpO2 stayed at 92% and above during ambulation. Required multiple standing rest breaks d/t fatigue. Pt left in bed, with bed alarm on.   Nelta Numbers Mobility Specialist  Phone [phone]

## 2021-01-31 NOTE — Care Management Important Message (Signed)
Important Message  Patient Details  Name: Paul [name] MRN: [id number] Date of Birth: May 17, 1950   Medicare Important Message Given:  Yes     Shelda Altes 01/31/2021, 10:45 AM

## 2021-01-31 NOTE — Progress Notes (Addendum)
Occupational Therapy Treatment Patient Details Name: Paul [name] MRN: [id number] DOB: [birth year] Today's Date: 01/31/2021   History of present illness The pt is a 70 yo male presenting 10/11 for CABG x2 due to CAD. Ileus. PMH includes: HTN, DM II, and obesity.   OT comments  Patient received in bed and agreeable to OT treatment.  Patient required assistance with BLEs to get to eob and was able to ambulate to sink for grooming.  Patient stood at sink to perform grooming and required seated rest break following. Patient performed toilet transfers and functional mobility with RW and min to min guard assist.  Patient had coughing episodes during treatment and nursing was notified.  Acute OT to continue to follow    Recommendations for follow up therapy are one component of a multi-disciplinary discharge planning process, led by the attending physician.  Recommendations may be updated based on patient status, additional functional criteria and insurance authorization.    Follow Up Recommendations  SNF    Equipment Recommendations  3 in 1 bedside commode;Tub/shower seat    Recommendations for Other Services      Precautions / Restrictions Precautions Precautions: Sternal;Fall Precaution Booklet Issued: No Precaution Comments: pt reports frequent falls at home, urinary urgency, reviewed sternal precautions       Mobility Bed Mobility Overal bed mobility: Needs Assistance Bed Mobility: Supine to Sit;Sit to Supine     Supine to sit: Min assist;HOB elevated Sit to supine: Min assist;HOB elevated   General bed mobility comments: required assistance with BLEs    Transfers Overall transfer level: Needs assistance Equipment used: Rolling walker (2 wheeled) Transfers: Sit to/from [facility] Sit to Stand: Min guard;Min assist Stand pivot transfers: Min guard       General transfer comment: min assist for safety with verbal cues for sternal precautions    Balance  Overall balance assessment: Needs assistance;History of Falls Sitting-balance support: No upper extremity supported;Feet supported Sitting balance-Leahy Scale: Fair     Standing balance support: Single extremity supported;During functional activity Standing balance-Leahy Scale: Fair Standing balance comment: able to stand at sink for groomin                           ADL either performed or assessed with clinical judgement   ADL Overall ADL's : Needs assistance/impaired     Grooming: Wash/dry hands;Wash/dry face;Oral care;Min guard;Standing Grooming Details (indicate cue type and reason): required seated rest break following performing grooming tasks                 Toilet Transfer: Min guard;BSC     Toileting - Clothing Manipulation Details (indicate cue type and reason): patient able to manage urinal     Functional mobility during ADLs: Min guard;Minimal assistance General ADL Comments: patient performed toilet transfer to [place]: Awake/alert Behavior During Therapy: WFL for tasks assessed/performed Overall Cognitive Status: Within Functional Limits for tasks assessed                                 General Comments: cues for safety        Exercises     Shoulder Instructions       General Comments      Pertinent Vitals/ Pain  Pain Assessment: Faces Faces Pain Scale: Hurts little more Pain Location: sternum Pain Descriptors / Indicators: Discomfort;Operative site guarding;Grimacing Pain Intervention(s): Monitored during session  Home Living                                          Prior Functioning/Environment              Frequency  Min 2X/week        Progress Toward Goals  OT Goals(current goals can now be found in the care plan section)  Progress towards OT goals: Progressing toward goals  Acute Rehab OT  Goals Patient Stated Goal: to get better OT Goal Formulation: With patient Time For Goal Achievement: 02/10/21 Potential to Achieve Goals: Good ADL Goals Pt Will Perform Grooming: with supervision;standing Pt Will Perform Lower Body Bathing: with supervision;sit to/from stand Pt Will Perform Lower Body Dressing: with supervision;sit to/from stand Pt Will Transfer to Toilet: with supervision;ambulating;regular height toilet Pt Will Perform Toileting - Clothing Manipulation and hygiene: with supervision;sit to/from stand  Plan Discharge plan remains appropriate    Co-evaluation                 AM-PAC OT "6 Clicks" Daily Activity     Outcome Measure   Help from another person eating meals?: A Little Help from another person taking care of personal grooming?: A Little Help from another person toileting, which includes using toliet, bedpan, or urinal?: A Lot Help from another person bathing (including washing, rinsing, drying)?: A Lot Help from another person to put on and taking off regular upper body clothing?: A Lot Help from another person to put on and taking off regular lower body clothing?: A Lot 6 Click Score: 14    End of Session Equipment Utilized During Treatment: Gait belt;Rolling walker;Oxygen  OT Visit Diagnosis: Unsteadiness on feet (R26.81);Muscle weakness (generalized) (M62.81);History of falling (Z91.81);Pain   Activity Tolerance Patient tolerated treatment well   Patient Left in bed;with call bell/phone within reach;with bed alarm set;Other (comment) (mats on floor)   Nurse Communication Mobility status        Time: 1310-1340 OT Time Calculation (min): 30 min  Charges: OT General Charges $OT Visit: 1 Visit OT Treatments $Self Care/Home Management : 23-37 mins  Lodema Hong, [place]  Pager [phone] Office [place] 01/31/2021, 3:13 PM

## 2021-01-31 NOTE — Progress Notes (Signed)
OT Cancellation Note  Patient Details Name: Chay Mazzoni MRN: [id number] DOB: July 02, 1950   Cancelled Treatment:    Reason Eval/Treat Not Completed: Patient not medically ready (nursing asked for OT to not attempt treatment at this time due to medical reasons.) Lodema Hong, [place]  Pager [phone] Office [place] 01/31/2021, 9:59 AM

## 2021-02-01 LAB — BASIC METABOLIC PANEL
Anion gap: 10 (ref 5–15)
BUN: 32 mg/dL — ABNORMAL HIGH (ref 8–23)
CO2: 25 mmol/L (ref 22–32)
Calcium: 8.7 mg/dL — ABNORMAL LOW (ref 8.9–10.3)
Chloride: 100 mmol/L (ref 98–111)
Creatinine, Ser: 1.31 mg/dL — ABNORMAL HIGH (ref 0.61–1.24)
GFR, Estimated: 58 mL/min — ABNORMAL LOW (ref >60)
Glucose, Bld: 190 mg/dL — ABNORMAL HIGH (ref 70–99)
Potassium: 4 mmol/L (ref 3.5–5.1)
Sodium: 135 mmol/L (ref 135–145)

## 2021-02-01 LAB — GLUCOSE, CAPILLARY
Glucose-Capillary: 114 mg/dL — ABNORMAL HIGH (ref 70–99)
Glucose-Capillary: 114 mg/dL — ABNORMAL HIGH (ref 70–99)
Glucose-Capillary: 167 mg/dL — ABNORMAL HIGH (ref 70–99)
Glucose-Capillary: 248 mg/dL — ABNORMAL HIGH (ref 70–99)
Glucose-Capillary: 154 mg/dL — ABNORMAL HIGH (ref 70–99)
Glucose-Capillary: 242 mg/dL — ABNORMAL HIGH (ref 70–99)
Glucose-Capillary: 115 mg/dL — ABNORMAL HIGH (ref 70–99)

## 2021-02-01 MED ORDER — INSULIN ASPART 100 UNIT/ML IJ SOLN
0.0000 [IU] | Freq: Three times a day (TID) | INTRAMUSCULAR | Status: DC
  Administered 2021-02-02: 4 [IU] via SUBCUTANEOUS
  Administered 2021-02-02: 2 [IU] via SUBCUTANEOUS

## 2021-02-01 MED ORDER — METOPROLOL TARTRATE 25 MG PO TABS
25.0000 mg | ORAL_TABLET | Freq: Two times a day (BID) | ORAL | Status: DC
  Administered 2021-02-01 – 2021-02-02 (×3): 25 mg via ORAL
  Filled 2021-02-01 (×3): qty 1

## 2021-02-01 NOTE — TOC Initial Note (Signed)
Transition of Care [facility]) - Initial/Assessment Note    Patient Details  Name: Paul [name] MRN: [id number] Date of Birth: 12-31-1950  [facility]) CM/SW Contact:    Vinie Sill, LCSW Phone Number: 02/01/2021, 3:05 PM  Clinical Narrative:                  CSW met with patient at bedside. CSW introduced self and explained role. CSW discussed with patient therapy recommendations of short term rehab at [facility]. Patient states he lives home alone and will need rehab before discharging home. CSW explained the SNF process. Preferred SNF is [facility]. Patient states no questions or concerns at this time.   CSW will provide bed offers once available. CSW will continue to follow and assist with discharge planning.  Thurmond Butts, MSW, LCSW Clinical Social Worker    Expected Discharge Plan: Skilled Nursing Facility Barriers to Discharge: Barriers Resolved   Patient Goals and CMS Choice        Expected Discharge Plan and Services Expected Discharge Plan: [place] In-house Referral: Clinical Social Work                                            Prior Living Arrangements/Services   Lives with:: Self Patient language and need for interpreter reviewed:: No        Need for Family Participation in Patient Care: Yes (Comment) Care giver support system in place?: Yes (comment)   Criminal Activity/Legal Involvement Pertinent to Current Situation/Hospitalization: No - Comment as needed  Activities of Daily Living Home Assistive Devices/Equipment: CBG Meter, Eyeglasses, Nebulizer ADL Screening (condition at time of admission) Patient's cognitive ability adequate to safely complete daily activities?: Yes Is the patient deaf or have difficulty hearing?: No Does the patient have difficulty seeing, even when wearing glasses/contacts?: No Does the patient have difficulty concentrating, remembering, or making decisions?: No Patient able to  express need for assistance with ADLs?: Yes Does the patient have difficulty dressing or bathing?: No Independently performs ADLs?: Yes (appropriate for developmental age) Does the patient have difficulty walking or climbing stairs?: No Weakness of Legs: None Weakness of Arms/Hands: None  Permission Sought/Granted Permission sought to share information with : Family Supports Permission granted to share information with : Yes, Verbal Permission Granted  Share Information with NAME: Rodriquez Thorner  Permission granted to share info w AGENCY: SNFs  Permission granted to share info w Relationship: brother  Permission granted to share info w Contact Information: [phone]  Emotional Assessment Appearance:: Appears older than stated age Attitude/Demeanor/Rapport: Engaged Affect (typically observed): Appropriate, Pleasant Orientation: : Oriented to Self, Oriented to Place, Oriented to  Time, Oriented to Situation Alcohol / Substance Use: Not Applicable Psych Involvement: No (comment)  Admission diagnosis:  Coronary artery disease [I25.10] Patient Active Problem List   Diagnosis Date Noted   S/P CABG x 2 01/25/2021   Coronary artery disease 01/25/2021   Angina, class III ([place]) 12/28/2020   Healthcare maintenance 02/23/2020   Primary osteoarthritis, left shoulder 07/29/2018   Prolonged QT interval    Labile blood glucose    Type 2 diabetes mellitus with peripheral neuropathy ([place])    Labile blood pressure    Sleep disturbance    Hypoalbuminemia due to protein-calorie malnutrition ([place])    Poorly controlled type 2 diabetes mellitus with peripheral neuropathy ([place])    Dysphagia  Candidiasis    Acute encephalopathy 05/03/2018   Pressure injury of skin 04/30/2018   Coronary artery disease involving native coronary artery of native heart with angina pectoris ([place])    Tobacco abuse    Hypertension    Acute blood loss anemia    Bacteremia    Respiratory failure ([place])    CAP (community  acquired pneumonia) 04/21/2018   Corneal irritation of both eyes 10/31/2017   Allergic rhinitis due to animal hair and dander 10/31/2017   OSA on CPAP 10/10/2016   OSA and COPD overlap syndrome ([place]) 03/02/2016   Bronchitis, chronic obstructive, with exacerbation ([place]) 03/02/2016   Hypoxemia 03/02/2016   Dependence on supplemental oxygen 03/02/2016   CAD, multiple vessel 03/02/2016   Gastroesophageal reflux disease 03/02/2016   HTN (hypertension), malignant 03/02/2016   Cigarette smoker 02/22/2014   DOE (dyspnea on exertion) 01/20/2014   Obesity (BMI 30.0-34.9)    Essential hypertension 11/24/2011   Hyperlipidemia with target LDL less than 70 11/24/2011   OSA (obstructive sleep apnea), uses oxygen at home did not tolerate cpap 11/24/2011   COPD 11/24/2011   CAD S/P percutaneous coronary angioplasty -- D1, Xience Xpedition DES 2.5 mm x 33 mm 11/24/2011   PCP:  Leanna Battles, MD Pharmacy:   [facility] [place], [address] [place] [address] Phone: [phone] Fax: [phone]     Social Determinants of Health (SDOH) Interventions    Readmission Risk Interventions No flowsheet data found.

## 2021-02-01 NOTE — Progress Notes (Signed)
Mobility Specialist Progress Note:   02/01/21 1045  Therapy Vitals  Pulse Rate 81  Oxygen Therapy  SpO2 93 %  Mobility  Activity Ambulated in hall  Level of Assistance Minimal assist, patient does 75% or more  Assistive Device Front wheel walker  Distance Ambulated (ft) 120 ft  Mobility Ambulated with assistance in hallway  Mobility Response Tolerated fair  Mobility performed by Mobility specialist  Bed Position Chair  $Mobility charge 1 Mobility   Pre Mobility: HR 81 bpm; SpO2 93% During Mobility: HR 94 bpm; SpO2 86-92% Post Mobility: HR 86 bpm; SpO2 93%  Pt received in chair, fatigued from ambulation with cardiac rehab. Agreed to mobility. Required minA to stand from chair. Ambulated in hallway 120' on 2LO2 with RW. Pt desat to 86% during amb, recovered to 92% with pursed lip breathing. Pt c/o SOB, and total body fatigue. Pt left in chair with call bell in reach.   Nelta Numbers Mobility Specialist  Phone [phone]

## 2021-02-01 NOTE — Progress Notes (Addendum)
[address]       Paul [name],Paul [name] [address]             [phone]      7 Days Post-Op Procedure(s) (LRB): CORONARY ARTERY BYPASS GRAFTING (CABG) X2, USING LEFT INTERNAL MAMMARY ARTERY AND LEFT LEG GREATER SAPHENOUS VEIN HARVESTED ENDOSCOPICALLY (N/A) TRANSESOPHAGEAL ECHOCARDIOGRAM (TEE) (N/A) ENDOVEIN HARVEST OF GREATER SAPHENOUS VEIN (Bilateral) APPLICATION OF CELL SAVER Subjective: Feels ok, still with productive cough  Objective: Vital signs in last 24 hours: Temp:  [98.3 F (36.8 C)-98.7 F (37.1 C)] 98.3 F (36.8 C) (10/18 0427) Pulse Rate:  [86-100] 90 (10/18 0427) Cardiac Rhythm: Normal sinus rhythm (10/18 0420) Resp:  [14-21] 21 (10/18 0427) BP: (110-133)/(61-68) 123/68 (10/18 0427) SpO2:  [91 %-97 %] 94 % (10/18 0427) Weight:  [87.9 kg] 87.9 kg (10/18 0439)  Hemodynamic parameters for last 24 hours:    Intake/Output from previous day: 10/17 0701 - 10/18 0700 In: 360 [P.Paul.:360] Out: 1150 [Urine:1150] Intake/Output this shift: No intake/output data recorded.  Alert NAD Lungs: faint scattered wheeze Cor: RRR Abd: benign, less distension Ext: minor edema Incis healing well  Lab Results: No results for input(s): WBC, HGB, HCT, PLT in the last 72 hours. BMET: No results for input(s): NA, K, CL, CO2, GLUCOSE, BUN, CREATININE, CALCIUM in the last 72 hours.  PT/INR: No results for input(s): LABPROT, INR in the last 72 hours. ABG    Component Value Date/Time   PHART 7.395 01/25/2021 1605   HCO3 20.8 01/25/2021 1605   TCO2 22 01/25/2021 1605   ACIDBASEDEF 3.0 (H) 01/25/2021 1605   O2SAT 94.0 01/25/2021 1605   CBG (last 3)  Recent Labs    01/31/21 2357 02/01/21 0429 02/01/21 0613  GLUCAP 114* 114* 167*    Meds Scheduled Meds:  allopurinol  100 mg Oral Daily   aspirin EC  81 mg Oral Daily   bisacodyl  10 mg Oral Daily   Or   bisacodyl  10 mg Rectal Daily   chlorhexidine gluconate (MEDLINE KIT)  15 mL Mouth Rinse BID    Chlorhexidine Gluconate Cloth  6 each Topical Q0600   clopidogrel  75 mg Oral Daily   [facility], Patient & Family Education   Does not apply Once   dextromethorphan-guaiFENesin  1 tablet Oral BID   docusate sodium  200 mg Oral Daily   ferrous [id number] C-folic acid  1 capsule Oral BID PC   furosemide  40 mg Oral Daily   gabapentin  600 mg Oral BID   insulin aspart  0-24 Units Subcutaneous Q4H   insulin detemir  15 Units Subcutaneous BID   loratadine  10 mg Oral Daily   mouth rinse  15 mL Mouth Rinse q12n4p   metFORMIN  1,000 mg Oral Q breakfast   metoprolol tartrate  12.5 mg Oral BID   Or   metoprolol tartrate  12.5 mg Per Tube BID   pantoprazole  40 mg Oral Daily   potassium chloride  40 mEq Oral Daily   rosuvastatin  20 mg Oral Daily   tamsulosin  0.4 mg Oral Daily   umeclidinium-vilanterol  1 puff Inhalation Daily   Continuous Infusions:  sodium chloride     sodium chloride     sodium chloride Stopped (01/28/21 0820)   lactated ringers     lactated ringers Stopped (01/26/21 1612)   sodium chloride Stopped (01/26/21 0225)   PRN Meds:.sodium chloride, albuterol, dextrose, diphenhydrAMINE, lip balm, metoprolol tartrate,  midazolam, morphine injection, oxyCODONE, [COMPLETED] albumin human **AND** sodium chloride  Xrays CT HEAD WO CONTRAST (5MM)  Result Date: 01/30/2021 CLINICAL DATA:  Facial trauma EXAM: CT HEAD WITHOUT CONTRAST TECHNIQUE: Contiguous axial images were obtained from the base of the skull through the vertex without intravenous contrast. COMPARISON:  None. FINDINGS: Brain: No evidence of acute infarction, hemorrhage, hydrocephalus, extra-axial collection or mass lesion/mass effect. Mild for age patchy white matter hypodensities, nonspecific but compatible with chronic microvascular ischemic disease. Mild for age generalized atrophy with ex vacuo ventricular dilation. Vascular: No hyperdense vessel identified. Calcific intracranial  atherosclerosis. Skull: No acute fracture.  Central forehead scalp contusion. Sinuses/Orbits: Mild paranasal sinus mucosal thickening without visible air-fluid level. Unremarkable orbits. Other: No mastoid effusions. IMPRESSION: 1. No evidence of acute intracranial abnormality. 2. Central forehead scalp contusion without acute fracture. 3. Mild for age chronic microvascular disease and atrophy. Electronically Signed   By: Margaretha Sheffield M.D.   On: 01/30/2021 19:09    Assessment/Plan: S/P Procedure(s) (LRB): CORONARY ARTERY BYPASS GRAFTING (CABG) X2, USING LEFT INTERNAL MAMMARY ARTERY AND LEFT LEG GREATER SAPHENOUS VEIN HARVESTED ENDOSCOPICALLY (N/A) TRANSESOPHAGEAL ECHOCARDIOGRAM (TEE) (N/A) ENDOVEIN HARVEST OF GREATER SAPHENOUS VEIN (Bilateral) APPLICATION OF CELL SAVER  1 afebrile, VSS 2 sats good on 2 liters 3 weight stable, good UOP 4 BS adeq control 5 ileus improving 6 push rehab/pulm toilet 7 SNF at d/c    LOS: 7 days    Paul [name] [phone] 02/01/2021  Titrating beta-blocker Dispo planning  Paul [name] Paul [name]

## 2021-02-01 NOTE — NC FL2 (Signed)
[place] LEVEL OF CARE SCREENING TOOL     IDENTIFICATION  Patient Name: Paul [name] Birthdate: 1950-05-12 Sex: male Admission Date (Current Location): 01/25/2021  [facility] and [place] Number:  Herbalist and Address:  [facility], [place] [address], [place], [place] [address]      Provider Number: [id number]  Attending Physician Name and Address:  Lajuana Matte, MD  Relative Name and Phone Number:       Current Level of Care: Hospital Recommended Level of Care: [place] Prior Approval Number:    Date Approved/Denied:   PASRR Number: [id number]  Discharge Plan: SNF    Current Diagnoses: Patient Active Problem List   Diagnosis Date Noted   S/P CABG x 2 01/25/2021   Coronary artery disease 01/25/2021   Angina, class III ([place]) 12/28/2020   Healthcare maintenance 02/23/2020   Primary osteoarthritis, left shoulder 07/29/2018   Prolonged QT interval    Labile blood glucose    Type 2 diabetes mellitus with peripheral neuropathy ([place])    Labile blood pressure    Sleep disturbance    Hypoalbuminemia due to protein-calorie malnutrition ([place])    Poorly controlled type 2 diabetes mellitus with peripheral neuropathy ([place])    Dysphagia    Candidiasis    Acute encephalopathy 05/03/2018   Pressure injury of skin 04/30/2018   Coronary artery disease involving native coronary artery of native heart with angina pectoris ([place])    Tobacco abuse    Hypertension    Acute blood loss anemia    Bacteremia    Respiratory failure ([place])    CAP (community acquired pneumonia) 04/21/2018   Corneal irritation of both eyes 10/31/2017   Allergic rhinitis due to animal hair and dander 10/31/2017   OSA on CPAP 10/10/2016   OSA and COPD overlap syndrome ([place]) 03/02/2016   Bronchitis, chronic obstructive, with exacerbation ([place]) 03/02/2016   Hypoxemia 03/02/2016   Dependence on supplemental oxygen 03/02/2016   CAD, multiple  vessel 03/02/2016   Gastroesophageal reflux disease 03/02/2016   HTN (hypertension), malignant 03/02/2016   Cigarette smoker 02/22/2014   DOE (dyspnea on exertion) 01/20/2014   Obesity (BMI 30.0-34.9)    Essential hypertension 11/24/2011   Hyperlipidemia with target LDL less than 70 11/24/2011   OSA (obstructive sleep apnea), uses oxygen at home did not tolerate cpap 11/24/2011   COPD 11/24/2011   CAD S/P percutaneous coronary angioplasty -- D1, Xience Xpedition DES 2.5 mm x 33 mm 11/24/2011    Orientation RESPIRATION BLADDER Height & Weight     Self, Time, Situation, Place  O2 External catheter, Incontinent Weight: 193 lb 12.6 oz (87.9 kg) Height:  5' 5"  (165.1 cm)  BEHAVIORAL SYMPTOMS/MOOD NEUROLOGICAL BOWEL NUTRITION STATUS      Continent Diet (please see discharge summary)  AMBULATORY STATUS COMMUNICATION OF NEEDS Skin   Supervision Verbally Surgical wounds (closed incision chest, closed incision groin LF, closed incision groin RT)                       Personal Care Assistance Level of Assistance  Bathing, Feeding, Dressing Bathing Assistance: Limited assistance Feeding assistance: Independent Dressing Assistance: Limited assistance     Functional Limitations Info  Sight, Hearing, Speech Sight Info: Adequate Hearing Info: Adequate Speech Info: Adequate    SPECIAL CARE FACTORS FREQUENCY  PT (By licensed PT), OT (By licensed OT)     PT Frequency: 5x per week OT Frequency: 5x per week  Contractures Contractures Info: Not present    Additional Factors Info  Code Status, Allergies Code Status Info: Full Allergies Info: Morphine and related           Current Medications (02/01/2021):  This is the current hospital active medication list Current Facility-Administered Medications  Medication Dose Route Frequency Provider Last Rate Last Admin   0.45 % sodium chloride infusion   Intravenous Continuous PRN Lars Pinks M, PA-C       0.9 %   sodium chloride infusion  250 mL Intravenous Continuous Zimmerman, Donielle M, PA-C       0.9 %  sodium chloride infusion   Intravenous Continuous Lars Pinks M, PA-C   Stopped at 01/28/21 0820   albuterol (PROVENTIL) (2.5 MG/3ML) 0.083% nebulizer solution 2.5 mg  2.5 mg Nebulization Q6H PRN Einar Grad, RPH       allopurinol (ZYLOPRIM) tablet 100 mg  100 mg Oral Daily Einar Grad, RPH   100 mg at 02/01/21 0818   aspirin EC tablet 81 mg  81 mg Oral Daily Lajuana Matte, MD   81 mg at 02/01/21 0819   bisacodyl (DULCOLAX) EC tablet 10 mg  10 mg Oral Daily Lars Pinks M, PA-C   10 mg at 02/01/21 4696   Or   bisacodyl (DULCOLAX) suppository 10 mg  10 mg Rectal Daily Lars Pinks M, PA-C       chlorhexidine gluconate (MEDLINE KIT) (PERIDEX) 0.12 % solution 15 mL  15 mL Mouth Rinse BID Lajuana Matte, MD   15 mL at 02/01/21 0756   Chlorhexidine Gluconate Cloth 2 % PADS 6 each  6 each Topical Q0600 Lajuana Matte, MD   6 each at 02/01/21 0429   clopidogrel (PLAVIX) tablet 75 mg  75 mg Oral Daily Lajuana Matte, MD   75 mg at 02/01/21 [facility], Patient & Family Education   Does not apply Once Lajuana Matte, MD       dextromethorphan-guaiFENesin [facility] DM) 30-600 MG per 12 hr tablet 1 tablet  1 tablet Oral BID Jadene Pierini E, PA-C   1 tablet at 02/01/21 0818   dextrose 50 % solution 0-50 mL  0-50 mL Intravenous PRN Lars Pinks M, PA-C       diphenhydrAMINE (BENADRYL) capsule 25 mg  25 mg Oral Q6H PRN Lajuana Matte, MD   25 mg at 01/28/21 2138   docusate sodium (COLACE) capsule 200 mg  200 mg Oral Daily Lars Pinks M, PA-C   200 mg at 02/01/21 0818   ferrous [id number] C-folic acid (TRINSICON / FOLTRIN) capsule 1 capsule  1 capsule Oral BID PC Gold, Wayne E, PA-C   1 capsule at 02/01/21 0818   furosemide (LASIX) tablet 40 mg  40 mg Oral Daily Lajuana Matte, MD   40 mg at  02/01/21 0819   gabapentin (NEURONTIN) capsule 600 mg  600 mg Oral BID Lars Pinks M, PA-C   600 mg at 02/01/21 3664   insulin aspart (novoLOG) injection 0-24 Units  0-24 Units Subcutaneous Q4H Lajuana Matte, MD   2 Units at 02/01/21 1232   insulin detemir (LEVEMIR) injection 15 Units  15 Units Subcutaneous BID Lajuana Matte, MD   15 Units at 02/01/21 4034   lactated ringers infusion   Intravenous Continuous Lars Pinks M, PA-C       lactated ringers infusion   Intravenous Continuous Lars Pinks M, PA-C   Stopped at 01/26/21  1612   lip balm (CARMEX) ointment   Topical PRN Lajuana Matte, MD   75 application at 86/16/83 0027   loratadine (CLARITIN) tablet 10 mg  10 mg Oral Daily Lars Pinks M, PA-C   10 mg at 02/01/21 7290   MEDLINE mouth rinse  15 mL Mouth Rinse q12n4p Lajuana Matte, MD   15 mL at 02/01/21 1233   metFORMIN (GLUCOPHAGE-XR) 24 hr tablet 1,000 mg  1,000 mg Oral Q breakfast Gold, Wayne E, PA-C   1,000 mg at 02/01/21 0818   metoprolol tartrate (LOPRESSOR) injection 2.5-5 mg  2.5-5 mg Intravenous Q2H PRN Lars Pinks M, PA-C   2.5 mg at 01/26/21 1915   metoprolol tartrate (LOPRESSOR) tablet 25 mg  25 mg Oral BID Lajuana Matte, MD   25 mg at 02/01/21 0819   midazolam (VERSED) injection 2 mg  2 mg Intravenous Q1H PRN Lars Pinks M, PA-C       morphine 2 MG/ML injection 1-4 mg  1-4 mg Intravenous Q1H PRN Lars Pinks M, PA-C   2 mg at 01/28/21 2140   oxyCODONE (Oxy IR/ROXICODONE) immediate release tablet 5-10 mg  5-10 mg Oral Q3H PRN Nani Skillern, PA-C   10 mg at 02/01/21 0818   pantoprazole (PROTONIX) EC tablet 40 mg  40 mg Oral Daily Lars Pinks M, PA-C   40 mg at 02/01/21 0818   potassium chloride SA (KLOR-CON) CR tablet 40 mEq  40 mEq Oral Daily Lajuana Matte, MD   40 mEq at 02/01/21 0819   rosuvastatin (CRESTOR) tablet 20 mg  20 mg Oral Daily Lars Pinks M, PA-C   20  mg at 02/01/21 2111   sodium chloride 0.9 % bolus 250 mL  250 mL Intravenous Q15 min PRN Einar Grad, [facility]   Stopped at 01/26/21 0225   tamsulosin (FLOMAX) capsule 0.4 mg  0.4 mg Oral Daily Lajuana Matte, MD   0.4 mg at 02/01/21 0818   umeclidinium-vilanterol (ANORO ELLIPTA) 62.5-25 MCG/INH 1 puff  1 puff Inhalation Daily Lajuana Matte, MD   1 puff at 02/01/21 5520     Discharge Medications: Please see discharge summary for a list of discharge medications.  Relevant Imaging Results:  Relevant Lab Results:   Additional Information SSN [id number]    [place] COVID-19 Vaccine 07/16/2019 , 06/19/2019  Vinie Sill, LCSW

## 2021-02-01 NOTE — Progress Notes (Signed)
Physical Therapy Treatment Patient Details Name: Paul [name] MRN: [id number] DOB: 1950-10-06 Today's Date: 02/01/2021   History of Present Illness The pt is a 70 yo male presenting 10/11 for CABG x2 due to CAD. Ileus. PMH includes: HTN, DM II, and obesity.    PT Comments    Pt making steady progress. Doesn't have home support at DC so CIR not option. Will need ST-SNF   Recommendations for follow up therapy are one component of a multi-disciplinary discharge planning process, led by the attending physician.  Recommendations may be updated based on patient status, additional functional criteria and insurance authorization.  Follow Up Recommendations  SNF     Equipment Recommendations  Rolling walker with 5" wheels;3in1 (PT)    Recommendations for Other Services       Precautions / Restrictions Precautions Precautions: Sternal;Fall     Mobility  Bed Mobility               General bed mobility comments: Pt up in chair    Transfers Overall transfer level: Needs assistance Equipment used: Rolling walker (2 wheeled) Transfers: Sit to/from Stand Sit to Stand: Min guard         General transfer comment: Assist for safety. Pt followed sternal precautions without cues  Ambulation/Gait Ambulation/Gait assistance: Min guard Gait Distance (Feet): 200 Feet Assistive device: Rolling walker (2 wheeled);4-wheeled walker Gait Pattern/deviations: Step-through pattern;Decreased stride length;Trunk flexed Gait velocity: decreased Gait velocity interpretation: <1.31 ft/sec, indicative of household ambulator General Gait Details: Assist for safety. Pt tried rollator for short distance but preferred the stability of the rolling walker   Stairs             Wheelchair Mobility    Modified Rankin (Stroke Patients Only)       Balance Overall balance assessment: Needs assistance;History of Falls Sitting-balance support: No upper extremity supported;Feet  supported Sitting balance-Leahy Scale: Good     Standing balance support: During functional activity;No upper extremity supported Standing balance-Leahy Scale: Fair                              Cognition Arousal/Alertness: Awake/alert Behavior During Therapy: WFL for tasks assessed/performed Overall Cognitive Status: Within Functional Limits for tasks assessed                                        Exercises      General Comments General comments (skin integrity, edema, etc.): Pt on 2L O2 with SpO2 97%. After amb SpO2 95%. Dyspnea 3/4 with several standing rest breaks.      Pertinent Vitals/Pain Pain Assessment: No/denies pain    Home Living                      Prior Function            PT Goals (current goals can now be found in the care plan section) Acute Rehab PT Goals Patient Stated Goal: to get better Progress towards PT goals: Progressing toward goals    Frequency    Min 2X/week      PT Plan Discharge plan needs to be updated;Frequency needs to be updated    Co-evaluation              AM-PAC PT "6 Clicks" Mobility   Outcome Measure  Help needed turning from your back  to your side while in a flat bed without using bedrails?: A Little Help needed moving from lying on your back to sitting on the side of a flat bed without using bedrails?: A Little Help needed moving to and from a bed to a chair (including a wheelchair)?: A Little Help needed standing up from a chair using your arms (e.g., wheelchair or bedside chair)?: A Little Help needed to walk in hospital room?: A Little Help needed climbing 3-5 steps with a railing? : A Little 6 Click Score: 18    End of Session Equipment Utilized During Treatment: Oxygen Activity Tolerance: Patient tolerated treatment well Patient left: in chair;with call bell/phone within reach;with chair alarm set   PT Visit Diagnosis: Other abnormalities of gait and mobility  (R26.89);Repeated falls (R29.6);Unsteadiness on feet (R26.81);Difficulty in walking, not elsewhere classified (R26.2);Muscle weakness (generalized) (M62.81)     Time: 2902-1115 PT Time Calculation (min) (ACUTE ONLY): 16 min  Charges:  $Gait Training: 8-22 mins                     [place] Pager [phone] Office [place] 02/01/2021, 4:59 PM

## 2021-02-01 NOTE — Progress Notes (Signed)
CARDIAC REHAB PHASE I   PRE:  Rate/Rhythm: 87 SR  BP:  Sitting: 116/64      SaO2: 94 4L  MODE:  Ambulation: 200 ft   POST:  Rate/Rhythm: 102 ST  BP:  Sitting: 112/47    SaO2: 94 3L   Pt assisted to BSC than ambulated 251ft in hallway assist of one with front wheel walker and gait belt. Pt denies CP, SOB, or dizziness. Does endorse arm fatigue. Encouraged use muscle to support his body instead of relying so heavily on walker. Pt returned to recliner. Able to cough up thick, yellow/green mucus. Reviewed site care, restrictions, and encouraged continued ambulation. Pt given heart healthy and diabetic diets. Will refer to CRP II [place].  [id number] Rufina Falco, RN BSN 02/01/2021 10:01 AM

## 2021-02-02 DIAGNOSIS — I10 Essential (primary) hypertension: Secondary | ICD-10-CM | POA: Diagnosis not present

## 2021-02-02 DIAGNOSIS — D649 Anemia, unspecified: Secondary | ICD-10-CM | POA: Diagnosis not present

## 2021-02-02 DIAGNOSIS — M19012 Primary osteoarthritis, left shoulder: Secondary | ICD-10-CM | POA: Diagnosis not present

## 2021-02-02 DIAGNOSIS — R0902 Hypoxemia: Secondary | ICD-10-CM | POA: Diagnosis not present

## 2021-02-02 DIAGNOSIS — R2689 Other abnormalities of gait and mobility: Secondary | ICD-10-CM | POA: Diagnosis not present

## 2021-02-02 DIAGNOSIS — R531 Weakness: Secondary | ICD-10-CM | POA: Diagnosis not present

## 2021-02-02 DIAGNOSIS — Z7401 Bed confinement status: Secondary | ICD-10-CM | POA: Diagnosis not present

## 2021-02-02 DIAGNOSIS — E7849 Other hyperlipidemia: Secondary | ICD-10-CM | POA: Diagnosis not present

## 2021-02-02 DIAGNOSIS — K219 Gastro-esophageal reflux disease without esophagitis: Secondary | ICD-10-CM | POA: Diagnosis not present

## 2021-02-02 DIAGNOSIS — E785 Hyperlipidemia, unspecified: Secondary | ICD-10-CM | POA: Diagnosis not present

## 2021-02-02 DIAGNOSIS — F4320 Adjustment disorder, unspecified: Secondary | ICD-10-CM | POA: Diagnosis not present

## 2021-02-02 DIAGNOSIS — J441 Chronic obstructive pulmonary disease with (acute) exacerbation: Secondary | ICD-10-CM | POA: Diagnosis not present

## 2021-02-02 DIAGNOSIS — E1121 Type 2 diabetes mellitus with diabetic nephropathy: Secondary | ICD-10-CM | POA: Diagnosis not present

## 2021-02-02 DIAGNOSIS — F5101 Primary insomnia: Secondary | ICD-10-CM | POA: Diagnosis not present

## 2021-02-02 DIAGNOSIS — E118 Type 2 diabetes mellitus with unspecified complications: Secondary | ICD-10-CM | POA: Diagnosis not present

## 2021-02-02 DIAGNOSIS — Z23 Encounter for immunization: Secondary | ICD-10-CM | POA: Diagnosis not present

## 2021-02-02 DIAGNOSIS — Z9181 History of falling: Secondary | ICD-10-CM | POA: Diagnosis not present

## 2021-02-02 DIAGNOSIS — M6281 Muscle weakness (generalized): Secondary | ICD-10-CM | POA: Diagnosis not present

## 2021-02-02 DIAGNOSIS — Z48812 Encounter for surgical aftercare following surgery on the circulatory system: Secondary | ICD-10-CM | POA: Diagnosis not present

## 2021-02-02 DIAGNOSIS — R278 Other lack of coordination: Secondary | ICD-10-CM | POA: Diagnosis not present

## 2021-02-02 DIAGNOSIS — G9340 Encephalopathy, unspecified: Secondary | ICD-10-CM | POA: Diagnosis not present

## 2021-02-02 DIAGNOSIS — Z20828 Contact with and (suspected) exposure to other viral communicable diseases: Secondary | ICD-10-CM | POA: Diagnosis not present

## 2021-02-02 DIAGNOSIS — I2510 Atherosclerotic heart disease of native coronary artery without angina pectoris: Secondary | ICD-10-CM | POA: Diagnosis not present

## 2021-02-02 DIAGNOSIS — T82855D Stenosis of coronary artery stent, subsequent encounter: Secondary | ICD-10-CM | POA: Diagnosis not present

## 2021-02-02 LAB — GLUCOSE, CAPILLARY
Glucose-Capillary: 149 mg/dL — ABNORMAL HIGH (ref 70–99)
Glucose-Capillary: 199 mg/dL — ABNORMAL HIGH (ref 70–99)
Glucose-Capillary: 129 mg/dL — ABNORMAL HIGH (ref 70–99)

## 2021-02-02 LAB — BASIC METABOLIC PANEL
Anion gap: 10 (ref 5–15)
BUN: 32 mg/dL — ABNORMAL HIGH (ref 8–23)
CO2: 26 mmol/L (ref 22–32)
Calcium: 8.7 mg/dL — ABNORMAL LOW (ref 8.9–10.3)
Chloride: 100 mmol/L (ref 98–111)
Creatinine, Ser: 1.31 mg/dL — ABNORMAL HIGH (ref 0.61–1.24)
GFR, Estimated: 58 mL/min — ABNORMAL LOW (ref >60)
Glucose, Bld: 122 mg/dL — ABNORMAL HIGH (ref 70–99)
Potassium: 4.3 mmol/L (ref 3.5–5.1)
Sodium: 136 mmol/L (ref 135–145)

## 2021-02-02 LAB — SARS CORONAVIRUS 2 (TAT 6-24 HRS): SARS Coronavirus 2: NEGATIVE

## 2021-02-02 MED ORDER — INSULIN ASPART 100 UNIT/ML IJ SOLN: 0.0000 [IU] | Freq: Three times a day (TID) | INTRAMUSCULAR | 11 refills | Status: DC

## 2021-02-02 MED ORDER — ASPIRIN 81 MG PO TBEC: 81.0000 mg | DELAYED_RELEASE_TABLET | Freq: Every day | ORAL | 11 refills | Status: DC

## 2021-02-02 MED ORDER — TAMSULOSIN HCL 0.4 MG PO CAPS: 0.4000 mg | ORAL_CAPSULE | Freq: Every day | ORAL | 0 refills | Status: DC

## 2021-02-02 MED ORDER — MENTHOL 3 MG MT LOZG
1.0000 | LOZENGE | OROMUCOSAL | Status: DC | PRN
  Filled 2021-02-02: qty 9

## 2021-02-02 MED ORDER — OXYCODONE HCL 5 MG PO TABS: 5.0000 mg | ORAL_TABLET | Freq: Four times a day (QID) | ORAL | 0 refills | Status: DC | PRN

## 2021-02-02 MED ORDER — MENTHOL 3 MG MT LOZG
1.0000 | LOZENGE | OROMUCOSAL | 12 refills | Status: DC | PRN
Start: 2021-02-02 — End: 2021-12-08

## 2021-02-02 MED ORDER — BASAGLAR KWIKPEN 100 UNIT/ML ~~LOC~~ SOPN
20.0000 [IU] | PEN_INJECTOR | Freq: Every day | SUBCUTANEOUS | 0 refills | Status: DC
Start: 2021-02-02 — End: 2021-06-13

## 2021-02-02 MED ORDER — OXYCODONE HCL 5 MG PO TABS: 5.0000 mg | ORAL_TABLET | Freq: Four times a day (QID) | ORAL | 0 refills | Status: AC | PRN

## 2021-02-02 NOTE — Progress Notes (Signed)
Occupational Therapy Treatment Patient Details Name: Paul [name] MRN: [id number] DOB: 02-Apr-1951 Today's Date: 02/02/2021   History of present illness The pt is a 70 yo male presenting 10/11 for CABG x2 due to CAD. Ileus. PMH includes: HTN, DM II, and obesity.   OT comments  Patient received in bed and eager to get up. Patient was able to get to eob with extra time and required time before attempting to stand. Patient transferred to recliner and after a short rest addressed sit to stands from recliner without UE use. Setup patient for breakfast with patient requiring assistance to open containers.  Acute OT to continue to follow.     Recommendations for follow up therapy are one component of a multi-disciplinary discharge planning process, led by the attending physician.  Recommendations may be updated based on patient status, additional functional criteria and insurance authorization.    Follow Up Recommendations  SNF    Equipment Recommendations  3 in 1 bedside commode;Tub/shower seat    Recommendations for Other Services      Precautions / Restrictions Precautions Precautions: Sternal;Fall Precaution Booklet Issued: No Precaution Comments: pt reports frequent falls at home, urinary urgency, reviewed sternal precautions       Mobility Bed Mobility Overal bed mobility: Needs Assistance Bed Mobility: Supine to Sit   Sidelying to sit: Min assist;HOB elevated Supine to sit: Min assist;HOB elevated     General bed mobility comments: required assistance for trunk    Transfers Overall transfer level: Needs assistance Equipment used: Rolling walker (2 wheeled) Transfers: Sit to/from [facility] Sit to Stand: Min guard Stand pivot transfers: Min guard       General transfer comment: transfer to recliner with min guard for safety    Balance Overall balance assessment: Needs assistance;History of Falls Sitting-balance support: No upper extremity  supported;Feet supported Sitting balance-Leahy Scale: Good     Standing balance support: Bilateral upper extremity supported Standing balance-Leahy Scale: Fair Standing balance comment: patient stated he felt weak to stand at sink                           ADL either performed or assessed with clinical judgement   ADL Overall ADL's : Needs assistance/impaired Eating/Feeding: Set up;Sitting Eating/Feeding Details (indicate cue type and reason): required assistance to open containers Grooming: Wash/dry hands;Wash/dry face;Set up;Sitting Grooming Details (indicate cue type and reason): performed seated in recliner                             Functional mobility during ADLs: Min guard;Rolling walker General ADL Comments: ambulated short distance to recliner     Vision       Perception     Praxis      Cognition Arousal/Alertness: Awake/alert Behavior During Therapy: WFL for tasks assessed/performed Overall Cognitive Status: Within Functional Limits for tasks assessed                                 General Comments: able to recall therapist from previous visit        Exercises     Shoulder Instructions       General Comments      Pertinent Vitals/ Pain       Pain Assessment: Faces Faces Pain Scale: Hurts little more Pain Location: sternum Pain Descriptors / Indicators: Discomfort;Operative site guarding;Grimacing  Pain Intervention(s): Monitored during session  Home Living                                          Prior Functioning/Environment              Frequency  Min 2X/week        Progress Toward Goals  OT Goals(current goals can now be found in the care plan section)  Progress towards OT goals: Progressing toward goals  Acute Rehab OT Goals Patient Stated Goal: to get better OT Goal Formulation: With patient Time For Goal Achievement: 02/10/21 Potential to Achieve Goals: Good ADL  Goals Pt Will Perform Grooming: with supervision;standing Pt Will Perform Lower Body Bathing: with supervision;sit to/from stand Pt Will Perform Lower Body Dressing: with supervision;sit to/from stand Pt Will Transfer to Toilet: with supervision;ambulating;regular height toilet Pt Will Perform Toileting - Clothing Manipulation and hygiene: with supervision;sit to/from stand  Plan Discharge plan remains appropriate    Co-evaluation                 AM-PAC OT "6 Clicks" Daily Activity     Outcome Measure   Help from another person eating meals?: A Little Help from another person taking care of personal grooming?: A Little Help from another person toileting, which includes using toliet, bedpan, or urinal?: A Lot Help from another person bathing (including washing, rinsing, drying)?: A Lot Help from another person to put on and taking off regular upper body clothing?: A Lot Help from another person to put on and taking off regular lower body clothing?: A Lot 6 Click Score: 14    End of Session Equipment Utilized During Treatment: Rolling walker;Oxygen  OT Visit Diagnosis: Unsteadiness on feet (R26.81);Muscle weakness (generalized) (M62.81);History of falling (Z91.81);Pain   Activity Tolerance Patient tolerated treatment well   Patient Left in chair;with call bell/phone within reach;with chair alarm set   Nurse Communication Mobility status        Time: 0865-7846 OT Time Calculation (min): 23 min  Charges: OT Treatments $Self Care/Home Management : 8-22 mins $Therapeutic Activity: 8-22 mins  Lodema Hong, [place]  Pager [phone] Office [place] 02/02/2021, 10:58 AM

## 2021-02-02 NOTE — TOC Progression Note (Signed)
Transition of Care [facility]) - Progression Note    Patient Details  Name: Paul [name] MRN: [id number] Date of Birth: 1951/01/06  Transition of Care [facility]) CM/SW [place], [place] Phone Number: 02/02/2021, 8:54 AM  Clinical Narrative:     CSW sent message to [facility] - waiting to see if they can admit patient.  Thurmond Butts, MSW, LCSW Clinical Social Worker    Expected Discharge Plan: Skilled Nursing Facility Barriers to Discharge: Barriers Resolved  Expected Discharge Plan and Services Expected Discharge Plan: [place] In-house Referral: Clinical Social Work                                             Social Determinants of Health (SDOH) Interventions    Readmission Risk Interventions No flowsheet data found.

## 2021-02-02 NOTE — Progress Notes (Signed)
Mobility Specialist Progress Note:   02/02/21 1640  Therapy Vitals  Pulse Rate 91  Mobility  Activity Ambulated in hall  Level of Assistance Contact guard assist, steadying assist  Assistive Device Front wheel walker  Distance Ambulated (ft) 240 ft ((80'+160'))  Mobility Ambulated with assistance in hallway  Mobility Response Tolerated well  Mobility performed by Mobility specialist  $Mobility charge 1 Mobility   Pre Mobility: HR 91 bpm During Mobility: HR 96 bpm Post Mobility: HR 92 bpm  Pt received sitting EOB, agreed to mobility. Ambulated in hall 240' (80'+160') on 2LO2, with RW and contact G. Pt became incontinent of urine at 40' and returned to room for pericare, then agreed to amb again. Required x1 standing rest break d/t SOB. Pt left sitting EOB with family present.    Nelta Numbers Mobility Specialist  Phone [phone]

## 2021-02-02 NOTE — Progress Notes (Signed)
18:35 Report given to RN at [facility]. All questions answered. Payton Emerald, RN

## 2021-02-02 NOTE — Progress Notes (Signed)
Pt picked up by [facility] for transport to [facility]. Attempted to call report [phone] and transferred to another line which disconnected after several rings. Will wait for facility to call for report. Payton Emerald, RN

## 2021-02-02 NOTE — Progress Notes (Addendum)
      [address]       [place] [address]             [phone]      8 Days Post-Op Procedure(s) (LRB): CORONARY ARTERY BYPASS GRAFTING (CABG) X2, USING LEFT INTERNAL MAMMARY ARTERY AND LEFT LEG GREATER SAPHENOUS VEIN HARVESTED ENDOSCOPICALLY (N/A) TRANSESOPHAGEAL ECHOCARDIOGRAM (TEE) (N/A) ENDOVEIN HARVEST OF GREATER SAPHENOUS VEIN (Bilateral) APPLICATION OF CELL SAVER Subjective: Feels okay this morning other than some chest and throat pain.   Objective: Vital signs in last 24 hours: Temp:  [97.8 F (36.6 C)-98.7 F (37.1 C)] 98.7 F (37.1 C) (10/19 0332) Pulse Rate:  [79-94] 89 (10/19 0726) Cardiac Rhythm: Normal sinus rhythm (10/18 1943) Resp:  [16-22] 22 (10/19 0726) BP: (101-141)/(57-65) 118/62 (10/19 0726) SpO2:  [91 %-95 %] 95 % (10/19 0726) Weight:  [83.6 kg] 83.6 kg (10/19 0454)     Intake/Output from previous day: 10/18 0701 - 10/19 0700 In: -  Out: 1900 [Urine:1900] Intake/Output this shift: No intake/output data recorded.  General appearance: alert, cooperative, and no distress Heart: regular rate and rhythm, S1, S2 normal, no murmur, click, rub or gallop Lungs: clear to auscultation bilaterally Abdomen: soft, non-tender; bowel sounds normal; no masses,  no organomegaly Extremities: extremities normal, atraumatic, no cyanosis or edema Wound: clean and dry  Lab Results: No results for input(s): WBC, HGB, HCT, PLT in the last 72 hours. BMET:  Recent Labs    02/01/21 1018 02/02/21 0139  NA 135 136  K 4.0 4.3  CL 100 100  CO2 25 26  GLUCOSE 190* 122*  BUN 32* 32*  CREATININE 1.31* 1.31*  CALCIUM 8.7* 8.7*    PT/INR: No results for input(s): LABPROT, INR in the last 72 hours. ABG    Component Value Date/Time   PHART 7.395 01/25/2021 1605   HCO3 20.8 01/25/2021 1605   TCO2 22 01/25/2021 1605   ACIDBASEDEF 3.0 (H) 01/25/2021 1605   O2SAT 94.0 01/25/2021 1605   CBG (last 3)  Recent Labs    02/01/21 1734 02/01/21 2101  02/02/21 0558  GLUCAP 242* 115* 149*    Assessment/Plan: S/P Procedure(s) (LRB): CORONARY ARTERY BYPASS GRAFTING (CABG) X2, USING LEFT INTERNAL MAMMARY ARTERY AND LEFT LEG GREATER SAPHENOUS VEIN HARVESTED ENDOSCOPICALLY (N/A) TRANSESOPHAGEAL ECHOCARDIOGRAM (TEE) (N/A) ENDOVEIN HARVEST OF GREATER SAPHENOUS VEIN (Bilateral) APPLICATION OF CELL SAVER  CV-NSR in the 80s, BP well controlled. Continue asa. Statin, and metoprolol Pulm-remains on 2L of oxygen with good saturation.  Renal-weight is decreasing, he remains on PO lasix. Creatinine stable at 1.31. Good urine output H and H stable 8.0/25.5 GI-ileus post-op Endo-blood glucose well controlled on current regimen Throat pain- will order lozenges   Plan: Discharge to SNF once insurance approves. Encouraged use of incentive spirometer and ambulation in the halls.    LOS: 8 days    Elgie Collard 02/02/2021   Agree with above [place]

## 2021-02-02 NOTE — Progress Notes (Signed)
Attempted to call [place] to give report. Transferred to voicemail box "not set up". Will try again later. Payton Emerald, RN

## 2021-02-02 NOTE — Progress Notes (Signed)
During bath this morning, pt's scrotum noted to have a skin tear that was bleeding. Pt cleaned up, got bleeding to subside with a few minutes of pressure. Condom cath removed and replavced with primofit.

## 2021-02-02 NOTE — Progress Notes (Signed)
CARDIAC REHAB PHASE I   PRE:  Rate/Rhythm: 78 SR    BP: sitting 104/54    SaO2: 94 2L  MODE:  Ambulation: 160 ft   POST:  Rate/Rhythm: 92 SR    BP: sitting 99/74     SaO2: 92 2L  Pt stood and ambulated with RW, 2L, gait belt min assist Fatigue with distance and needed multiple short standing rest stops. VSS, return to recliner. Main c/o is fatigued arms but also noted legs. [id number] (delayed entry)   [place], ACSM 02/02/2021 4:07 PM

## 2021-02-02 NOTE — TOC Transition Note (Signed)
Transition of Care [facility]) - CM/SW Discharge Note   Patient Details  Name: Paul [name] MRN: [id number] Date of Birth: 12/12/1950  Transition of Care [facility]) CM/SW Contact:  Vinie Sill, LCSW Phone Number: 02/02/2021, 1:20 PM   Clinical Narrative:     Patient will Discharge to: [place]  Discharge Date: 02/02/2021 Family Notified: brother,Jay  Transport DX:IPJA  Per MD patient is ready for discharge. RN, patient, and facility notified of discharge. Discharge Summary sent to facility. RN given number for [phone], Room 104. Ambulance transport requested for patient.   Clinical Social Worker signing off.  Thurmond Butts, MSW, LCSW Clinical Social Worker     Final next level of care: Skilled Nursing Facility Barriers to Discharge: SNF Pending bed offer   Patient Goals and CMS Choice        Discharge Placement              Patient chooses bed at: [facility] Patient to be transferred to facility by: [place] Name of family member notified: brother,Jay Patient and family notified of of transfer: 02/02/21  Discharge Plan and Services In-house Referral: Clinical Social Work                                   Social Determinants of Health ([place]) Interventions     Readmission Risk Interventions No flowsheet data found.

## 2021-02-02 NOTE — Progress Notes (Signed)
Called by NT Cordelia to check on patient for odd behavior (staring off into space). Neuro assessment WNL. Per RN Cherise and NT Cordelia had patient from a previous fall after coughing fit. Per previous notes patient has history of multiple falls at home and coughing at home.Patient states he is OK and this is not new for him. States he does not remember feeling dizzy or lightheaded. Pt resting with call bell within reach.  Will continue to monitor.

## 2021-02-04 ENCOUNTER — Other Ambulatory Visit: Payer: Self-pay

## 2021-02-04 ENCOUNTER — Telehealth: Payer: Self-pay | Admitting: Thoracic Surgery (Cardiothoracic Vascular Surgery)

## 2021-02-04 DIAGNOSIS — K219 Gastro-esophageal reflux disease without esophagitis: Secondary | ICD-10-CM | POA: Diagnosis not present

## 2021-02-04 DIAGNOSIS — R0902 Hypoxemia: Secondary | ICD-10-CM | POA: Diagnosis not present

## 2021-02-04 DIAGNOSIS — E118 Type 2 diabetes mellitus with unspecified complications: Secondary | ICD-10-CM | POA: Diagnosis not present

## 2021-02-04 DIAGNOSIS — E785 Hyperlipidemia, unspecified: Secondary | ICD-10-CM | POA: Diagnosis not present

## 2021-02-04 DIAGNOSIS — G9340 Encephalopathy, unspecified: Secondary | ICD-10-CM | POA: Diagnosis not present

## 2021-02-04 DIAGNOSIS — Z48812 Encounter for surgical aftercare following surgery on the circulatory system: Secondary | ICD-10-CM | POA: Diagnosis not present

## 2021-02-04 DIAGNOSIS — J441 Chronic obstructive pulmonary disease with (acute) exacerbation: Secondary | ICD-10-CM | POA: Diagnosis not present

## 2021-02-04 DIAGNOSIS — D649 Anemia, unspecified: Secondary | ICD-10-CM | POA: Diagnosis not present

## 2021-02-04 DIAGNOSIS — T82855D Stenosis of coronary artery stent, subsequent encounter: Secondary | ICD-10-CM | POA: Diagnosis not present

## 2021-02-04 DIAGNOSIS — I10 Essential (primary) hypertension: Secondary | ICD-10-CM | POA: Diagnosis not present

## 2021-02-06 NOTE — Progress Notes (Deleted)
Cardiology Office Note:    Date:  02/06/2021   ID:  [name], DOB 07/15/1950, MRN [id number]  PCP:  Leanna Battles, MD  Cardiologist:  Glenetta Hew, MD  Electrophysiologist:  None   Referring MD: Leanna Battles, MD   Chief Complaint: hospital follow-up after CABG  History of Present Illness:    Paul [name] is a 70 y.o. male with a history of CAD with with DES to [place] in 2013 and recent CABG x2 (LIMA to LAD and SVG to OM), COPD, hypertension, hyperlipidemia, type 2 diabetes mellitus, obstructive sleep apnea unable to tolerate CPAP, and obesity who is followed by Dr. Ellyn Hack and presents today for hospital follow-up after CABG.  Patient was added onto Dr. Doug Sou schedule in 11/2020 for further evaluation of chest pain. Outpatient cardiac catheterization was ordered and showed severe 3 vessel CAD with extensive LAD and LCX disease and an occluded Diag. He was referred to CT surgery and decision was made to proceed with CABG. Echo prior to CABG showed LVEF of 60-65% with normal wall motion and grade 1 diastolic dysfunction. Before CABG could be done, he did present to the ED on 01/09/2021 shoulder pain and abdominal pain. High-sensitivity troponin was negative. Chest CTA was negative for PE but did show a possible penetrating atheromatous ulcer vs focal dissection vs large noncalcified atherosclerotic plaque of the infrarenal abdominal aorta. Vascular surgery was consulted and felt like this represented a chronic focal dissection or penetrating aortic ulcer but not felt to be related to his presenting symptoms. Repeat CT scan was recommended in 6 months.  Patient presented to [facility] on 01[facility]25[facility]2021 and underwent CABG x2 with LIMA to LAD and SVG to OM with Dr. Kipp Brood. Post-op course was relatively unremarkable. He had expected post-op blood loss anemia and thrombocytopenia but did not require a blood transfusion. He also had expected volume overload and was diuresed. He was discharged to  SNF on 02/02/2021. He was discharged on Aspirin 81mg  daily, Plavix 75mg  daily, Lasix 20mg  daily, Lopressor 25mg  twice daily, Crestor 20mg  daily.   Patient presents today for follow-up. ***  CAD s/p Recent CABG - History of CAD s/p PCI in 2013 and more recent CABG x2 on 01/25/2021. - Recovering well. No chest pain.  - Continue DAPT with Aspirin and Plavix.  - Continue beta-blocker and high-intensity statin.  Hypertension - BP well controlled. *** - Continue Lopressor 25mg  twice daily.  Hyperlipidemia - Lipid panel in 12/2020: total [place] 119, Triglycerides 201, HDL 24, LDL 61.  - LDL goal <70 given CAD. - Continue Crestor 20mg  daily.   Type 2 Diabetes Mellitus  - Hemoglobin A1c 8.8%. - Management per PCP.   Obstructive Sleep Apnea - ***  Past Medical History:  Diagnosis Date   CAD S/P percutaneous coronary angioplasty -- D1, Xience Xpedition DES 2.5 mm x 33 mm 11/24/2011   a) Mild-to-moderate 30-40% lesions in the RCA, LAD and Circumflex. b) CULPRIT LESION: long tubular 70-80% lesion in D1 with FFR of 0.7 --> PCI w/ Xience Xpedition DES 2.5 mm x 30 mm (2.65 MM); c) Lexiscan Myoview 11/2013: No Ischemia or Infarct (Inferior Gut Attenuation) EF 63%.   COPD (chronic obstructive pulmonary disease) ([place])    "don't have full case of it; I'm right there at it"   Diabetes mellitus without complication ([place])    Essential hypertension 11/24/2011   Exertional dyspnea, chronic    Gout    History of Unstable angina 11/24/2011   Referred for cardiac catheterization   Hyperlipidemia  with target LDL less than 70 11/24/2011   Obesity (BMI 30.0-34.9)    OSA (obstructive sleep apnea), uses oxygen at home did not tolerate cpap 11/24/2011    Past Surgical History:  Procedure Laterality Date   CERVICAL SPINE SURGERY  2012   CORONARY ANGIOPLASTY WITH STENT PLACEMENT  11/23/2011   "1; first one"   CORONARY ARTERY BYPASS GRAFT N/A 01/25/2021   Procedure: CORONARY ARTERY BYPASS GRAFTING (CABG) X2,  USING LEFT INTERNAL MAMMARY ARTERY AND LEFT LEG GREATER SAPHENOUS VEIN HARVESTED ENDOSCOPICALLY;  Surgeon: Lajuana Matte, MD;  Location: [facility];  Service: Open Heart Surgery;  Laterality: N/A;   ENDOVEIN HARVEST OF GREATER SAPHENOUS VEIN Bilateral 01/25/2021   Procedure: ENDOVEIN HARVEST OF GREATER SAPHENOUS VEIN;  Surgeon: Lajuana Matte, MD;  Location: [facility];  Service: Open Heart Surgery;  Laterality: Bilateral;   LEFT HEART CATH AND CORONARY ANGIOGRAPHY N/A 12/28/2020   Procedure: LEFT HEART CATH AND CORONARY ANGIOGRAPHY;  Surgeon: Leonie Man, MD;  Location: [place] [facility] CV LAB;  Service: Cardiovascular;  Laterality: N/A;   LEFT HEART CATHETERIZATION WITH CORONARY ANGIOGRAM N/A 11/23/2011   Procedure: LEFT HEART CATHETERIZATION WITH CORONARY ANGIOGRAM;  Surgeon: Leonie Man, MD;  Location[facility];  Service: Cardiovascular;  Laterality: N/A;   TEE WITHOUT CARDIOVERSION N/A 01/25/2021   Procedure: TRANSESOPHAGEAL ECHOCARDIOGRAM (TEE);  Surgeon: Lajuana Matte, MD;  Location: [facility];  Service: Open Heart Surgery;  Laterality: N/A;    Current Medications: No outpatient medications have been marked as taking for the 02/11/21 encounter (Appointment) with Darreld Mclean, PA-C.     Allergies:   Morphine and related and Other   Social History   Socioeconomic History   Marital status: Divorced    Spouse name: Not on file   Number of children: Not on file   Years of education: Not on file   Highest education level: Not on file  Occupational History   Not on file  Tobacco Use   Smoking status: Former    Packs/day: 1.00    Years: 40.00    Pack years: 40.00    Types: Cigarettes    Quit date: 06/10/2016    Years since quitting: 4.6   Smokeless tobacco: Never   Tobacco comments:    02/18/14- smokes occ cig maybe 3 x per wk  Vaping Use   Vaping Use: Never used  Substance and Sexual Activity   Alcohol use: Yes    Alcohol/week: 0.0 standard drinks    Comment:  social 2-3 drink a week   Drug use: No   Sexual activity: Not Currently  Other Topics Concern   Not on file  Social History Narrative   He is a 70 y.o. divorced father of 25, grandfather 1.   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   He quit smoking in August 2013, after smoking a pack a day for roughly 40 years.   He drinks socially 2-3 drinks a week only.   He does not get routine exercise, mostly due to 2 fatigue and dyspnea. Otherwise been relatively sedentary.   Social Determinants of Health   Financial Resource Strain: Not on file  Food Insecurity: Not on file  Transportation Needs: Not on file  Physical Activity: Not on file  Stress: Not on file  Social Connections: Not on file     Family History: The patient's  family history includes Aneurysm in his father; Emphysema in his father; Heart attack in his brother; Heart disease in his sister.  ROS:   Please see the history of present illness.     EKGs/Labs/Other Studies Reviewed:    The following studies were reviewed today:  Left Cardiac Catheterization 12/28/2020:   Prox LAD to Mid LAD lesion is 65% stenosed with 30% stenosed side branch in 1st Diag. (RFR 0.90-0.91)   1st Diag lesion is 100% stenosed. (In-stent Re-stenosis/occlusion)   Mid LAD lesion is 40% stenosed.   Mid LAD to Dist LAD lesion is 75% stenosed. (RFR 0.74)   Ost Cx to Mid Cx lesion is 55% stenosed with 20% stenosed side branch in 1st Mrg.   Mid Cx lesion is 90% stenosed.   Prox RCA lesion is 40% stenosed.   -------------------------------------------   The left ventricular systolic function is normal.  The left ventricular ejection fraction is 50-55% by visual estimate.   LV end diastolic pressure is normal.   There is no aortic valve stenosis.   Summary: Severe 3 Vessel CAD:  100% ISR of Major D1 stent,  focal  65% prox LAD (@ D1) lesion - FFR 0.91 followed by 40% LAD @ SP1 then long 70% mid LAD (FFR 0.74 - SIGNIFICANT).  prox-mid LCx diffuse 55% with focal 90% just after AVG branch in LCx-Lateral OM2. Moderate Prox-mid RCA 35-40% @ bend  Poor LV Gram image, but appears to have preserved LVEF with normal LVEDP (2 D Echo ordered).      Recommendations: With extensive LAD and LCx disease and occluded diagonal, best treatment option would likely be CVTS.  He has been on Plavix since his PCI. CVTS consult placed, 2D echo ordered, and Plavix discontinued. Anticipate outpatient CVTS clinic visit with staged CABG after Plavix washout -> otherwise would need to consider extensive PCI of the LAD with focal PCI of LCx and attempted recanalization of occluded D1 stent Will need to continue aggressive risk factor modification/Guideline Directed Medical Management _______________  Echocardiogram 12/28/2020: Impressions:  1. Left ventricular ejection fraction, by estimation, is 60 to 65%. The  left ventricle has normal function. The left ventricle has no regional  wall motion abnormalities. Left ventricular diastolic parameters are  consistent with Grade I diastolic  dysfunction (impaired relaxation).   2. Right ventricular systolic function is normal. The right ventricular  size is normal.   3. The mitral valve is normal in structure. No evidence of mitral valve  regurgitation. No evidence of mitral stenosis.   4. The aortic valve is normal in structure. Aortic valve regurgitation is  not visualized. No aortic stenosis is present.   5. The inferior vena cava is normal in size with greater than 50%  respiratory variability, suggesting right atrial pressure of 3 mmHg.   EKG:  EKG ordered today. EKG personally reviewed and demonstrates ***.  Recent Labs: 07/14/2020: TSH 2.852 01/21/2021: ALT 26 01/26/2021: Magnesium 2.1 01/29/2021: Hemoglobin 8.0; Platelets 127 02/02/2021: BUN 32; Creatinine, Ser 1.31;  Potassium 4.3; Sodium 136  Recent Lipid Panel    Component Value Date/Time   CHOL 119 12/21/2020 0824   TRIG 201 (H) 12/21/2020 0824   HDL 24 (L) 12/21/2020 0824   CHOLHDL 5.0 12/21/2020 0824   CHOLHDL 6.6 11/23/2011 0846   VLDL 29 11/23/2011 0846   LDLCALC 61 12/21/2020 0824    Physical Exam:    Vital Signs: There were no vitals taken for this visit.    Wt Readings from Last 3 Encounters:  02/02/21 184 lb 3.2 oz (83.6 kg)  01/21/21 183 lb 4.8 oz (83.1 kg)  01/14/21 185 lb (83.9 kg)     General: 70 y.o. male in no acute distress. HEENT: Normocephalic and atraumatic. Sclera clear. EOMs intact. Neck: Supple. No carotid bruits. No JVD. Heart: *** RRR. Distinct S1 and S2. No murmurs, gallops, or rubs. Radial and distal pedal pulses 2+ and equal bilaterally. Lungs: No increased work of breathing. Clear to ausculation bilaterally. No wheezes, rhonchi, or rales.  Abdomen: Soft, non-distended, and non-tender to palpation. Bowel sounds present in all 4 quadrants.  MSK: Normal strength and tone for age. *** Extremities: No lower extremity edema.    Skin: Warm and dry. Neuro: Alert and oriented x3. No focal deficits. Psych: Normal affect. Responds appropriately.   Assessment:    No diagnosis found.  Plan:     Disposition: Follow up in ***   Medication Adjustments/Labs and Tests Ordered: Current medicines are reviewed at length with the patient today.  Concerns regarding medicines are outlined above.  No orders of the defined types were placed in this encounter.  No orders of the defined types were placed in this encounter.   There are no Patient Instructions on file for this visit.   Signed, Darreld Mclean, PA-C  02/06/2021 3:23 PM    [facility]

## 2021-02-07 DIAGNOSIS — E785 Hyperlipidemia, unspecified: Secondary | ICD-10-CM | POA: Diagnosis not present

## 2021-02-07 DIAGNOSIS — J441 Chronic obstructive pulmonary disease with (acute) exacerbation: Secondary | ICD-10-CM | POA: Diagnosis not present

## 2021-02-07 DIAGNOSIS — F5101 Primary insomnia: Secondary | ICD-10-CM | POA: Diagnosis not present

## 2021-02-07 DIAGNOSIS — E118 Type 2 diabetes mellitus with unspecified complications: Secondary | ICD-10-CM | POA: Diagnosis not present

## 2021-02-07 DIAGNOSIS — T82855D Stenosis of coronary artery stent, subsequent encounter: Secondary | ICD-10-CM | POA: Diagnosis not present

## 2021-02-07 DIAGNOSIS — F4320 Adjustment disorder, unspecified: Secondary | ICD-10-CM | POA: Diagnosis not present

## 2021-02-07 DIAGNOSIS — I10 Essential (primary) hypertension: Secondary | ICD-10-CM | POA: Diagnosis not present

## 2021-02-07 DIAGNOSIS — D649 Anemia, unspecified: Secondary | ICD-10-CM | POA: Diagnosis not present

## 2021-02-07 DIAGNOSIS — K219 Gastro-esophageal reflux disease without esophagitis: Secondary | ICD-10-CM | POA: Diagnosis not present

## 2021-02-07 DIAGNOSIS — Z48812 Encounter for surgical aftercare following surgery on the circulatory system: Secondary | ICD-10-CM | POA: Diagnosis not present

## 2021-02-09 DIAGNOSIS — J441 Chronic obstructive pulmonary disease with (acute) exacerbation: Secondary | ICD-10-CM | POA: Diagnosis not present

## 2021-02-09 DIAGNOSIS — I10 Essential (primary) hypertension: Secondary | ICD-10-CM | POA: Diagnosis not present

## 2021-02-09 DIAGNOSIS — E785 Hyperlipidemia, unspecified: Secondary | ICD-10-CM | POA: Diagnosis not present

## 2021-02-09 DIAGNOSIS — D649 Anemia, unspecified: Secondary | ICD-10-CM | POA: Diagnosis not present

## 2021-02-09 DIAGNOSIS — K219 Gastro-esophageal reflux disease without esophagitis: Secondary | ICD-10-CM | POA: Diagnosis not present

## 2021-02-09 DIAGNOSIS — T82855D Stenosis of coronary artery stent, subsequent encounter: Secondary | ICD-10-CM | POA: Diagnosis not present

## 2021-02-09 DIAGNOSIS — E118 Type 2 diabetes mellitus with unspecified complications: Secondary | ICD-10-CM | POA: Diagnosis not present

## 2021-02-10 ENCOUNTER — Encounter: Payer: Self-pay | Admitting: Student

## 2021-02-10 DIAGNOSIS — D649 Anemia, unspecified: Secondary | ICD-10-CM | POA: Diagnosis not present

## 2021-02-10 DIAGNOSIS — E7849 Other hyperlipidemia: Secondary | ICD-10-CM | POA: Diagnosis not present

## 2021-02-10 DIAGNOSIS — K219 Gastro-esophageal reflux disease without esophagitis: Secondary | ICD-10-CM | POA: Diagnosis not present

## 2021-02-10 DIAGNOSIS — I2510 Atherosclerotic heart disease of native coronary artery without angina pectoris: Secondary | ICD-10-CM | POA: Diagnosis not present

## 2021-02-10 DIAGNOSIS — J441 Chronic obstructive pulmonary disease with (acute) exacerbation: Secondary | ICD-10-CM | POA: Diagnosis not present

## 2021-02-10 DIAGNOSIS — I10 Essential (primary) hypertension: Secondary | ICD-10-CM | POA: Diagnosis not present

## 2021-02-10 DIAGNOSIS — Z48812 Encounter for surgical aftercare following surgery on the circulatory system: Secondary | ICD-10-CM | POA: Diagnosis not present

## 2021-02-10 DIAGNOSIS — T82855D Stenosis of coronary artery stent, subsequent encounter: Secondary | ICD-10-CM | POA: Diagnosis not present

## 2021-02-10 DIAGNOSIS — E118 Type 2 diabetes mellitus with unspecified complications: Secondary | ICD-10-CM | POA: Diagnosis not present

## 2021-02-11 ENCOUNTER — Ambulatory Visit: Payer: Medicare Other | Admitting: Student

## 2021-02-14 DIAGNOSIS — K219 Gastro-esophageal reflux disease without esophagitis: Secondary | ICD-10-CM | POA: Diagnosis not present

## 2021-02-14 DIAGNOSIS — E785 Hyperlipidemia, unspecified: Secondary | ICD-10-CM | POA: Diagnosis not present

## 2021-02-14 DIAGNOSIS — E118 Type 2 diabetes mellitus with unspecified complications: Secondary | ICD-10-CM | POA: Diagnosis not present

## 2021-02-14 DIAGNOSIS — J441 Chronic obstructive pulmonary disease with (acute) exacerbation: Secondary | ICD-10-CM | POA: Diagnosis not present

## 2021-02-14 DIAGNOSIS — I10 Essential (primary) hypertension: Secondary | ICD-10-CM | POA: Diagnosis not present

## 2021-02-14 DIAGNOSIS — Z48812 Encounter for surgical aftercare following surgery on the circulatory system: Secondary | ICD-10-CM | POA: Diagnosis not present

## 2021-02-14 DIAGNOSIS — D649 Anemia, unspecified: Secondary | ICD-10-CM | POA: Diagnosis not present

## 2021-02-14 DIAGNOSIS — T82855D Stenosis of coronary artery stent, subsequent encounter: Secondary | ICD-10-CM | POA: Diagnosis not present

## 2021-02-16 ENCOUNTER — Other Ambulatory Visit: Payer: Self-pay | Admitting: *Deleted

## 2021-02-16 DIAGNOSIS — I10 Essential (primary) hypertension: Secondary | ICD-10-CM | POA: Diagnosis not present

## 2021-02-16 DIAGNOSIS — E785 Hyperlipidemia, unspecified: Secondary | ICD-10-CM | POA: Diagnosis not present

## 2021-02-16 DIAGNOSIS — J441 Chronic obstructive pulmonary disease with (acute) exacerbation: Secondary | ICD-10-CM | POA: Diagnosis not present

## 2021-02-16 DIAGNOSIS — E118 Type 2 diabetes mellitus with unspecified complications: Secondary | ICD-10-CM | POA: Diagnosis not present

## 2021-02-16 DIAGNOSIS — D649 Anemia, unspecified: Secondary | ICD-10-CM | POA: Diagnosis not present

## 2021-02-16 DIAGNOSIS — I2510 Atherosclerotic heart disease of native coronary artery without angina pectoris: Secondary | ICD-10-CM | POA: Diagnosis not present

## 2021-02-16 DIAGNOSIS — K219 Gastro-esophageal reflux disease without esophagitis: Secondary | ICD-10-CM | POA: Diagnosis not present

## 2021-02-16 NOTE — Patient Outreach (Signed)
Member screened for potential [facility] Care Management needs. Mr. Bewley resides in [facility].   [facility] [place] [facility] SW reports member will transition home tomorrow. He lives alone and has supportive family that will check on him. Home health arranged thru [place] [facility]. Mr. Benzel is slated to transition to home on tomorrow 02/15/21.  Will plan outreach to discuss [place] Management follow up.  Marthenia Rolling, MSN, RN,BSN [place] Acute Care Coordinator [phone] [facility]) [phone]  (Toll free office)

## 2021-02-17 DIAGNOSIS — Z20828 Contact with and (suspected) exposure to other viral communicable diseases: Secondary | ICD-10-CM | POA: Diagnosis not present

## 2021-02-18 ENCOUNTER — Telehealth: Payer: Self-pay | Admitting: *Deleted

## 2021-02-18 ENCOUNTER — Other Ambulatory Visit: Payer: Self-pay | Admitting: *Deleted

## 2021-02-18 DIAGNOSIS — R0902 Hypoxemia: Secondary | ICD-10-CM | POA: Diagnosis not present

## 2021-02-18 DIAGNOSIS — Z4800 Encounter for change or removal of nonsurgical wound dressing: Secondary | ICD-10-CM | POA: Diagnosis not present

## 2021-02-18 DIAGNOSIS — E785 Hyperlipidemia, unspecified: Secondary | ICD-10-CM | POA: Diagnosis not present

## 2021-02-18 DIAGNOSIS — J449 Chronic obstructive pulmonary disease, unspecified: Secondary | ICD-10-CM | POA: Diagnosis not present

## 2021-02-18 DIAGNOSIS — T82855D Stenosis of coronary artery stent, subsequent encounter: Secondary | ICD-10-CM | POA: Diagnosis not present

## 2021-02-18 DIAGNOSIS — Z9181 History of falling: Secondary | ICD-10-CM | POA: Diagnosis not present

## 2021-02-18 DIAGNOSIS — I10 Essential (primary) hypertension: Secondary | ICD-10-CM | POA: Diagnosis not present

## 2021-02-18 DIAGNOSIS — G4733 Obstructive sleep apnea (adult) (pediatric): Secondary | ICD-10-CM | POA: Diagnosis not present

## 2021-02-18 DIAGNOSIS — D62 Acute posthemorrhagic anemia: Secondary | ICD-10-CM | POA: Diagnosis not present

## 2021-02-18 DIAGNOSIS — R1310 Dysphagia, unspecified: Secondary | ICD-10-CM | POA: Diagnosis not present

## 2021-02-18 DIAGNOSIS — M1990 Unspecified osteoarthritis, unspecified site: Secondary | ICD-10-CM | POA: Diagnosis not present

## 2021-02-18 DIAGNOSIS — I2510 Atherosclerotic heart disease of native coronary artery without angina pectoris: Secondary | ICD-10-CM | POA: Diagnosis not present

## 2021-02-18 DIAGNOSIS — E1165 Type 2 diabetes mellitus with hyperglycemia: Secondary | ICD-10-CM | POA: Diagnosis not present

## 2021-02-18 DIAGNOSIS — Z951 Presence of aortocoronary bypass graft: Secondary | ICD-10-CM | POA: Diagnosis not present

## 2021-02-18 DIAGNOSIS — Z7984 Long term (current) use of oral hypoglycemic drugs: Secondary | ICD-10-CM | POA: Diagnosis not present

## 2021-02-18 DIAGNOSIS — K219 Gastro-esophageal reflux disease without esophagitis: Secondary | ICD-10-CM | POA: Diagnosis not present

## 2021-02-18 DIAGNOSIS — Z794 Long term (current) use of insulin: Secondary | ICD-10-CM | POA: Diagnosis not present

## 2021-02-18 NOTE — Patient Outreach (Signed)
[place] Coordinator follow up. Member screened for potential [facility] Care Management needs. [name] transitioned to home on 02/17/21 from [facility].   Telephone call made to [name] [phone]. Patient identifiers confirmed. [name] confirms he has [facility] for home health services. States the home health RN went over his medications. States therapy comes out on Monday. [name] states he is awaiting a call back from PCP office to schedule a visit. Endorses he lives alone but has friends and family who check on him and assist with transportation to MD appointments. States  " I have two friends here with me now."  Explained [facility] Care Management services. [name] is agreeable. Explained [facility] Care Management will not replace or interfere with services that are provided by home health. [name] confirms best contact telephone number for him is [phone].   Will make referral to [place] for complex case management. [name] had medical history of DM with Hgb A1c of 8.8, COPD, HTN, CAD, s/p CABG, OSA, gout.   Marthenia Rolling, MSN, RN,BSN [place] Acute Care Coordinator [phone] [facility]) [phone]  (Toll free office)

## 2021-02-18 NOTE — Telephone Encounter (Signed)
Patient's home health RN, Stanton Kidney, called stating patient doubled up on Amlodipine today. Patient's BP during RN visit was 102/60. Per nurse patient c/o fatigue. Denies blurry vision or lightheadedness. Per nurse, patient's son was going to purchase BP cuff to better monitor patient's VS. Nurse states she reviewed all med's with patient and family and removed remaining amlodipine from pill box. Patient contacted to f/u on nurses concerns. Per patient, he is feeling "fine" Advised patient if BP continues to drop and he begins to feel ill to report to ED. Patient also advised to reschedule missed appt with cardiologist. Patient given cardiologist name and phone number. Patient verbalizes understanding. Patient has f/u here with CXR with PA on 11/21.

## 2021-02-21 ENCOUNTER — Telehealth (HOSPITAL_COMMUNITY): Payer: Self-pay

## 2021-02-21 DIAGNOSIS — R0902 Hypoxemia: Secondary | ICD-10-CM | POA: Diagnosis not present

## 2021-02-21 DIAGNOSIS — J449 Chronic obstructive pulmonary disease, unspecified: Secondary | ICD-10-CM | POA: Diagnosis not present

## 2021-02-21 DIAGNOSIS — T82855D Stenosis of coronary artery stent, subsequent encounter: Secondary | ICD-10-CM | POA: Diagnosis not present

## 2021-02-21 DIAGNOSIS — E1165 Type 2 diabetes mellitus with hyperglycemia: Secondary | ICD-10-CM | POA: Diagnosis not present

## 2021-02-21 DIAGNOSIS — I2510 Atherosclerotic heart disease of native coronary artery without angina pectoris: Secondary | ICD-10-CM | POA: Diagnosis not present

## 2021-02-21 DIAGNOSIS — I10 Essential (primary) hypertension: Secondary | ICD-10-CM | POA: Diagnosis not present

## 2021-02-21 NOTE — Telephone Encounter (Signed)
Called patient to see if he is interested in the Cardiac Rehab Program. Patient expressed interest. Explained scheduling process and went over insurance process, patient verbalized understanding. Will contact patient for scheduling once f/u has been completed. 

## 2021-02-22 DIAGNOSIS — M109 Gout, unspecified: Secondary | ICD-10-CM | POA: Diagnosis not present

## 2021-02-22 DIAGNOSIS — R2681 Unsteadiness on feet: Secondary | ICD-10-CM | POA: Diagnosis not present

## 2021-02-22 DIAGNOSIS — I2510 Atherosclerotic heart disease of native coronary artery without angina pectoris: Secondary | ICD-10-CM | POA: Diagnosis not present

## 2021-02-22 DIAGNOSIS — G4733 Obstructive sleep apnea (adult) (pediatric): Secondary | ICD-10-CM | POA: Diagnosis not present

## 2021-02-22 DIAGNOSIS — E1165 Type 2 diabetes mellitus with hyperglycemia: Secondary | ICD-10-CM | POA: Diagnosis not present

## 2021-02-22 DIAGNOSIS — T82855D Stenosis of coronary artery stent, subsequent encounter: Secondary | ICD-10-CM | POA: Diagnosis not present

## 2021-02-22 DIAGNOSIS — J449 Chronic obstructive pulmonary disease, unspecified: Secondary | ICD-10-CM | POA: Diagnosis not present

## 2021-02-22 DIAGNOSIS — I10 Essential (primary) hypertension: Secondary | ICD-10-CM | POA: Diagnosis not present

## 2021-02-22 DIAGNOSIS — K5901 Slow transit constipation: Secondary | ICD-10-CM | POA: Diagnosis not present

## 2021-02-22 DIAGNOSIS — Z951 Presence of aortocoronary bypass graft: Secondary | ICD-10-CM | POA: Diagnosis not present

## 2021-02-22 DIAGNOSIS — R0902 Hypoxemia: Secondary | ICD-10-CM | POA: Diagnosis not present

## 2021-02-22 DIAGNOSIS — E118 Type 2 diabetes mellitus with unspecified complications: Secondary | ICD-10-CM | POA: Diagnosis not present

## 2021-02-23 DIAGNOSIS — J449 Chronic obstructive pulmonary disease, unspecified: Secondary | ICD-10-CM | POA: Diagnosis not present

## 2021-02-23 DIAGNOSIS — R0902 Hypoxemia: Secondary | ICD-10-CM | POA: Diagnosis not present

## 2021-02-23 DIAGNOSIS — I2510 Atherosclerotic heart disease of native coronary artery without angina pectoris: Secondary | ICD-10-CM | POA: Diagnosis not present

## 2021-02-23 DIAGNOSIS — T82855D Stenosis of coronary artery stent, subsequent encounter: Secondary | ICD-10-CM | POA: Diagnosis not present

## 2021-02-23 DIAGNOSIS — E1165 Type 2 diabetes mellitus with hyperglycemia: Secondary | ICD-10-CM | POA: Diagnosis not present

## 2021-02-23 DIAGNOSIS — I10 Essential (primary) hypertension: Secondary | ICD-10-CM | POA: Diagnosis not present

## 2021-02-24 DIAGNOSIS — I2510 Atherosclerotic heart disease of native coronary artery without angina pectoris: Secondary | ICD-10-CM | POA: Diagnosis not present

## 2021-02-24 DIAGNOSIS — J449 Chronic obstructive pulmonary disease, unspecified: Secondary | ICD-10-CM | POA: Diagnosis not present

## 2021-02-24 DIAGNOSIS — R0902 Hypoxemia: Secondary | ICD-10-CM | POA: Diagnosis not present

## 2021-02-24 DIAGNOSIS — T82855D Stenosis of coronary artery stent, subsequent encounter: Secondary | ICD-10-CM | POA: Diagnosis not present

## 2021-02-24 DIAGNOSIS — E1165 Type 2 diabetes mellitus with hyperglycemia: Secondary | ICD-10-CM | POA: Diagnosis not present

## 2021-02-24 DIAGNOSIS — I10 Essential (primary) hypertension: Secondary | ICD-10-CM | POA: Diagnosis not present

## 2021-02-28 DIAGNOSIS — T82855D Stenosis of coronary artery stent, subsequent encounter: Secondary | ICD-10-CM | POA: Diagnosis not present

## 2021-02-28 DIAGNOSIS — I2510 Atherosclerotic heart disease of native coronary artery without angina pectoris: Secondary | ICD-10-CM | POA: Diagnosis not present

## 2021-02-28 DIAGNOSIS — J449 Chronic obstructive pulmonary disease, unspecified: Secondary | ICD-10-CM | POA: Diagnosis not present

## 2021-02-28 DIAGNOSIS — I10 Essential (primary) hypertension: Secondary | ICD-10-CM | POA: Diagnosis not present

## 2021-02-28 DIAGNOSIS — E1165 Type 2 diabetes mellitus with hyperglycemia: Secondary | ICD-10-CM | POA: Diagnosis not present

## 2021-02-28 DIAGNOSIS — R0902 Hypoxemia: Secondary | ICD-10-CM | POA: Diagnosis not present

## 2021-03-01 ENCOUNTER — Other Ambulatory Visit: Payer: Self-pay | Admitting: *Deleted

## 2021-03-01 DIAGNOSIS — R0902 Hypoxemia: Secondary | ICD-10-CM | POA: Diagnosis not present

## 2021-03-01 DIAGNOSIS — I10 Essential (primary) hypertension: Secondary | ICD-10-CM | POA: Diagnosis not present

## 2021-03-01 DIAGNOSIS — T82855D Stenosis of coronary artery stent, subsequent encounter: Secondary | ICD-10-CM | POA: Diagnosis not present

## 2021-03-01 DIAGNOSIS — J449 Chronic obstructive pulmonary disease, unspecified: Secondary | ICD-10-CM | POA: Diagnosis not present

## 2021-03-01 DIAGNOSIS — I2510 Atherosclerotic heart disease of native coronary artery without angina pectoris: Secondary | ICD-10-CM | POA: Diagnosis not present

## 2021-03-01 DIAGNOSIS — E1165 Type 2 diabetes mellitus with hyperglycemia: Secondary | ICD-10-CM | POA: Diagnosis not present

## 2021-03-01 NOTE — Patient Outreach (Signed)
[facility]) Care Management  03/01/2021  Paul [name] [birth year] [id number]  SNF discharge Transition of care initial outreach 11/17  RN attempted outreach call today however unsuccessful. RN unable to leave a HIPAA voice message at this time as the voice mail was full. RN will sent outreach letter requesting a call return.   Will also follow up once again with another outreach call over the next week for pending [facility] services.  [name], RN Care Management Coordinator [place] Office [phone]

## 2021-03-02 ENCOUNTER — Other Ambulatory Visit: Payer: Self-pay | Admitting: Thoracic Surgery (Cardiothoracic Vascular Surgery)

## 2021-03-02 DIAGNOSIS — Z951 Presence of aortocoronary bypass graft: Secondary | ICD-10-CM

## 2021-03-02 DIAGNOSIS — E1165 Type 2 diabetes mellitus with hyperglycemia: Secondary | ICD-10-CM | POA: Diagnosis not present

## 2021-03-02 DIAGNOSIS — I10 Essential (primary) hypertension: Secondary | ICD-10-CM | POA: Diagnosis not present

## 2021-03-02 DIAGNOSIS — I2510 Atherosclerotic heart disease of native coronary artery without angina pectoris: Secondary | ICD-10-CM | POA: Diagnosis not present

## 2021-03-02 DIAGNOSIS — R0902 Hypoxemia: Secondary | ICD-10-CM | POA: Diagnosis not present

## 2021-03-02 DIAGNOSIS — T82855D Stenosis of coronary artery stent, subsequent encounter: Secondary | ICD-10-CM | POA: Diagnosis not present

## 2021-03-02 DIAGNOSIS — J449 Chronic obstructive pulmonary disease, unspecified: Secondary | ICD-10-CM | POA: Diagnosis not present

## 2021-03-04 ENCOUNTER — Other Ambulatory Visit: Payer: Self-pay | Admitting: *Deleted

## 2021-03-04 DIAGNOSIS — J449 Chronic obstructive pulmonary disease, unspecified: Secondary | ICD-10-CM | POA: Diagnosis not present

## 2021-03-04 DIAGNOSIS — E1165 Type 2 diabetes mellitus with hyperglycemia: Secondary | ICD-10-CM | POA: Diagnosis not present

## 2021-03-04 DIAGNOSIS — T82855D Stenosis of coronary artery stent, subsequent encounter: Secondary | ICD-10-CM | POA: Diagnosis not present

## 2021-03-04 DIAGNOSIS — I10 Essential (primary) hypertension: Secondary | ICD-10-CM | POA: Diagnosis not present

## 2021-03-04 DIAGNOSIS — I2510 Atherosclerotic heart disease of native coronary artery without angina pectoris: Secondary | ICD-10-CM | POA: Diagnosis not present

## 2021-03-04 DIAGNOSIS — R0902 Hypoxemia: Secondary | ICD-10-CM | POA: Diagnosis not present

## 2021-03-04 NOTE — Patient Outreach (Signed)
[facility]) Care Management  03/04/2021  Paul [name] 1951-04-11 [id number]  Transition of care-Post SNF discharge Outreach #2  RN attempted outreach call today however unsuccessful. RN unable to leave a message after two attempted calls today.  Will rescheduled another call over the next week for pending [facility] services.  [name], RN Care Management Coordinator [place] Office [phone]

## 2021-03-07 ENCOUNTER — Other Ambulatory Visit: Payer: Self-pay

## 2021-03-07 ENCOUNTER — Ambulatory Visit (INDEPENDENT_AMBULATORY_CARE_PROVIDER_SITE_OTHER): Payer: Self-pay | Admitting: Physician Assistant

## 2021-03-07 ENCOUNTER — Encounter: Payer: Self-pay | Admitting: *Deleted

## 2021-03-07 ENCOUNTER — Other Ambulatory Visit: Payer: Self-pay | Admitting: *Deleted

## 2021-03-07 ENCOUNTER — Ambulatory Visit
Admission: RE | Admit: 2021-03-07 | Discharge: 2021-03-07 | Disposition: A | Discharge status: Home / Self Care | Payer: Medicare Other | Source: Ambulatory Visit | Attending: Thoracic Surgery (Cardiothoracic Vascular Surgery) | Admitting: Thoracic Surgery (Cardiothoracic Vascular Surgery)

## 2021-03-07 VITALS — BP 132/73 | HR 88 | Resp 20 | Ht 65.0 in | Wt 178.6 lb

## 2021-03-07 DIAGNOSIS — Z951 Presence of aortocoronary bypass graft: Secondary | ICD-10-CM

## 2021-03-07 DIAGNOSIS — E1165 Type 2 diabetes mellitus with hyperglycemia: Secondary | ICD-10-CM | POA: Diagnosis not present

## 2021-03-07 DIAGNOSIS — T82855D Stenosis of coronary artery stent, subsequent encounter: Secondary | ICD-10-CM | POA: Diagnosis not present

## 2021-03-07 DIAGNOSIS — J449 Chronic obstructive pulmonary disease, unspecified: Secondary | ICD-10-CM | POA: Diagnosis not present

## 2021-03-07 DIAGNOSIS — R0902 Hypoxemia: Secondary | ICD-10-CM | POA: Diagnosis not present

## 2021-03-07 DIAGNOSIS — I10 Essential (primary) hypertension: Secondary | ICD-10-CM | POA: Diagnosis not present

## 2021-03-07 DIAGNOSIS — Z981 Arthrodesis status: Secondary | ICD-10-CM | POA: Diagnosis not present

## 2021-03-07 DIAGNOSIS — Z9889 Other specified postprocedural states: Secondary | ICD-10-CM | POA: Diagnosis not present

## 2021-03-07 DIAGNOSIS — I2510 Atherosclerotic heart disease of native coronary artery without angina pectoris: Secondary | ICD-10-CM | POA: Diagnosis not present

## 2021-03-07 MED ORDER — TRAMADOL HCL 50 MG PO TABS: 50.0000 mg | ORAL_TABLET | Freq: Four times a day (QID) | ORAL | 0 refills | Status: DC | PRN

## 2021-03-07 NOTE — Patient Instructions (Addendum)
You may return to driving an automobile as long as you are no longer requiring oral narcotic pain relievers during the daytime.  It would be wise to start driving only short distances during the daylight and gradually increase from there as you feel comfortable.   You may continue to gradually increase your physical activity as tolerated.  Refrain from any heavy lifting or strenuous use of your arms and shoulders until at least 8 weeks from the time of your surgery, and avoid activities that cause increased pain in your chest on the side of your surgical incision.  Otherwise you may continue to increase activities without any particular limitations.  Increase the intensity and duration of physical activity gradually.   You are encouraged to enroll and participate in the outpatient cardiac rehab program beginning as soon as practical. Make every effort to keep your diabetes under very tight control.  Follow up closely with your primary care physician or endocrinologist and strive to keep their hemoglobin A1c levels as low as possible, preferably near or below 6.0.  The long term benefits of strict control of diabetes are far reaching and critically important for your overall health and survival.

## 2021-03-07 NOTE — Patient Outreach (Signed)
[facility]) Care Management  [place]  03/07/2021   Paul [name] 14-Jan-1951 [id number]  Subjective: Pt requested monthly follow up calls and agrees to the discussed plan of care for diabetes management.  Information provided by pt documented in the plan of care.  Objective:   Encounter Medications:  Outpatient Encounter Medications as of 03/07/2021  Medication Sig   acetaminophen (TYLENOL) 500 MG tablet Take 1,500-2,000 mg by mouth daily.   albuterol (PROVENTIL) (2.5 MG/3ML) 0.083% nebulizer solution Take 2.5 mg by nebulization every 4 (four) hours as needed for wheezing or shortness of breath.   albuterol (VENTOLIN HFA) 108 (90 Base) MCG/ACT inhaler Inhale 2 puffs into the lungs every 4 (four) hours as needed for wheezing or shortness of breath.   allopurinol (ZYLOPRIM) 100 MG tablet Take 100 mg by mouth daily.   aspirin EC 81 MG EC tablet Take 1 tablet (81 mg total) by mouth daily. Swallow whole.   clopidogrel (PLAVIX) 75 MG tablet Take 1 tablet (75 mg total) by mouth daily.   furosemide (LASIX) 20 MG tablet Take 20 mg by mouth daily.   gabapentin (NEURONTIN) 300 MG capsule Take 600 mg by mouth 2 (two) times daily.   insulin aspart (NOVOLOG) 100 UNIT/ML injection Inject 0-24 Units into the skin 3 (three) times daily with meals.   Insulin Glargine (BASAGLAR KWIKPEN) 100 UNIT/ML Inject 20 Units into the skin daily.   loratadine (CLARITIN) 10 MG tablet Take 1 tablet (10 mg total) by mouth daily.   menthol-cetylpyridinium (CEPACOL) 3 MG lozenge Take 1 lozenge (3 mg total) by mouth as needed for sore throat.   metFORMIN (GLUCOPHAGE-XR) 500 MG 24 hr tablet Take 1,000 mg by mouth in the morning and at bedtime.   metoprolol tartrate (LOPRESSOR) 25 MG tablet Take 1 tablet (25 mg total) by mouth 2 (two) times daily.   polyethylene glycol (MIRALAX / GLYCOLAX) 17 g packet Take 17 g by mouth daily as needed for mild constipation.   rosuvastatin (CRESTOR) 20 MG tablet  Take 1 tablet (20 mg total) by mouth daily.   tamsulosin (FLOMAX) 0.4 MG CAPS capsule Take 1 capsule (0.4 mg total) by mouth daily.   Tetrahydrozoline HCl (VISINE EXTRA OP) Place 1 drop into both eyes as needed (itching).   umeclidinium-vilanterol (ANORO ELLIPTA) 62.5-25 MCG/INH AEPB Inhale 1 puff into the lungs daily.   Vitamin D, Ergocalciferol, (DRISDOL) 1.25 MG (50000 UNIT) CAPS capsule Take 50,000 Units by mouth once a week.   zolpidem (AMBIEN) 10 MG tablet Take 10 mg by mouth at bedtime.   No facility-administered encounter medications on file as of 03/07/2021.    Functional Status:  In your present state of health, do you have any difficulty performing the following activities: 03/07/2021 01/25/2021  Hearing? N N  Vision? N N  Difficulty concentrating or making decisions? N N  Walking or climbing stairs? N N  Dressing or bathing? N N  Doing errands, shopping? Y Y  Comment Has assistance with this task -  Preparing Food and eating ? N -  Using the Toilet? N -  In the past six months, have you accidently leaked urine? Y -  Comment Decline incontinence pads -  Do you have problems with loss of bowel control? N -  Managing your Medications? N -  Managing your Finances? N -  Housekeeping or managing your Housekeeping? N -  Some recent data might be hidden    Fall/Depression Screening: Fall Risk  03/07/2021 07/29/2018 06/04/2018  Falls in the past year? 1 1 0  Number falls in past yr: 1 1 -  Injury with Fall? 0 1 -  Comment - hit shoulder, saw chiropractor w x ray (no injury) -  Risk for fall due to : Impaired balance/gait History of fall(s) -  Follow up Education provided;Falls prevention discussed - -   PHQ 2/9 Scores 03/07/2021 07/29/2018  PHQ - 2 Score 0 0    Assessment:   Care Plan Care Plan : [place] of Care  Updates made by Tobi Bastos, RN since 03/07/2021 12:00 AM     Problem: Knowledge Deficit related to Diabetes and Care Coordination Needs    Priority: High     Long-Range Goal: Development plan of care for management of Diabetes   Start Date: 03/07/2021  Expected End Date: 06/14/2021  Priority: High  Note:   Current Barriers:  Knowledge Deficits related to plan of care for management of DMII Chronic Disease Management support and education needs related to DMII  RNCM Clinical Goal(s):  Patient will verbalize understanding of plan for management of DMII verbalize basic understanding of  DMII disease process and self health management plan utilizing the discussed plan of care and information provided today. take all medications exactly as prescribed and will call provider for medication related questions attend all scheduled medical appointments: Continuation of monitoring blood sugars and adhering to diabetic diet . continue to work with RN Care Manager to address care management and care coordination needs related to  DMII through collaboration with RN Care manager, provider, and care team.   Interventions: Inter-disciplinary care team collaboration (see longitudinal plan of care) Evaluation of current treatment plan related to  self management and patient's adherence to plan as established by provider   Diabetes Interventions: Assessed patient's understanding of A1c goal: <6.5% Provided education to patient about basic DM disease process; Counseled on importance of regular laboratory monitoring as prescribed; Advised patient, providing education and rationale, to check cbg TID and record, calling primary provider for findings outside established parameters; Lab Results  Component Value Date   HGBA1C 8.8 (H) 01/21/2021   Patient Goals/Self-Care Activities: Patient will self administer medications as prescribed Patient will attend all scheduled provider appointments Patient will call pharmacy for medication refills Patient will attend church or other social activities Patient will continue to perform ADL's  independently Patient will continue to perform IADL's independently Patient will call provider office for new concerns or questions  Follow Up Plan:  Telephone follow up appointment with care management team member scheduled for:  Dec 2022        Goals Addressed             This Visit's Progress    Development plan of care for management of Diabetes          Plan:  Follow-up: Patient agrees to Care Plan and Follow-up.  [name], RN Care Management Coordinator [place] Office [phone]

## 2021-03-07 NOTE — Patient Instructions (Signed)
Visit Information  Thank you for taking time to visit with me today. Please don't hesitate to contact me if I can be of assistance to you before our next scheduled telephone appointment.  Following are the goals we discussed today:  (Copy and paste patient goals from clinical care plan here)  Our next appointment is by telephone in Dec 2022.   Following is a copy of your care plan:  Care Plan : [place] of Care  Updates made by Tobi Bastos, RN since 03/07/2021 12:00 AM     Problem: Knowledge Deficit related to Diabetes and Care Coordination Needs   Priority: High     Long-Range Goal: Development plan of care for management of Diabetes   Start Date: 03/07/2021  Expected End Date: 06/14/2021  Priority: High  Note:   Current Barriers:  Knowledge Deficits related to plan of care for management of DMII Chronic Disease Management support and education needs related to DMII  RNCM Clinical Goal(s):  Patient will verbalize understanding of plan for management of DMII verbalize basic understanding of  DMII disease process and self health management plan utilizing the discussed plan of care and information provided today. take all medications exactly as prescribed and will call provider for medication related questions attend all scheduled medical appointments: Continuation of monitoring blood sugars and adhering to diabetic diet . continue to work with RN Care Manager to address care management and care coordination needs related to  DMII through collaboration with RN Care manager, provider, and care team.   Interventions: Inter-disciplinary care team collaboration (see longitudinal plan of care) Evaluation of current treatment plan related to  self management and patient's adherence to plan as established by provider   Diabetes Interventions: Assessed patient's understanding of A1c goal: <6.5% Provided education to patient about basic DM disease process; Counseled on  importance of regular laboratory monitoring as prescribed; Advised patient, providing education and rationale, to check cbg TID and record, calling primary provider for findings outside established parameters; Lab Results  Component Value Date   HGBA1C 8.8 (H) 01/21/2021   Patient Goals/Self-Care Activities: Patient will self administer medications as prescribed Patient will attend all scheduled provider appointments Patient will call pharmacy for medication refills Patient will attend church or other social activities Patient will continue to perform ADL's independently Patient will continue to perform IADL's independently Patient will call provider office for new concerns or questions  Follow Up Plan:  Telephone follow up appointment with care management team member scheduled for:  Dec 2022       The patient verbalized understanding of instructions, educational materials, and care plan provided today and declined offer to receive copy of patient instructions, educational materials, and care plan.   Telephone follow up appointment with care management team member scheduled for: The patient has been provided with contact information for the care management team and has been advised to call with any health related questions or concerns.   SIGNATURE  [name], RN Care Management Coordinator [place] Office [phone]

## 2021-03-07 NOTE — Progress Notes (Signed)
[address]       [place] [address]             [phone]       HPI: This is a 70 year old male with a past medical history of COPD, DM, tobacco abuse, OSA, obesity, and hypertension. Patient returns for routine follow up after undergoing a CABG x 2 on 01/25/2021 by Dr. Kipp Brood. His hospital course was uneventful except that he fell out of his chair after a coughing spell on 01/30/2021 He had a laceration to his nose and a contusion on his forehead. CT scan of the head was negative. Patient has complaints of incisional, sternal pain. He also stated he tripped yesterday, but did not hit his head. He denied syncope;he just "lost his footing". Current Outpatient Medications  Medication Sig Dispense Refill   acetaminophen (TYLENOL) 500 MG tablet Take 1,500-2,000 mg by mouth daily.     albuterol (PROVENTIL) (2.5 MG/3ML) 0.083% nebulizer solution Take 2.5 mg by nebulization every 4 (four) hours as needed for wheezing or shortness of breath.     albuterol (VENTOLIN HFA) 108 (90 Base) MCG/ACT inhaler Inhale 2 puffs into the lungs every 4 (four) hours as needed for wheezing or shortness of breath.     allopurinol (ZYLOPRIM) 100 MG tablet Take 100 mg by mouth daily.     aspirin EC 81 MG EC tablet Take 1 tablet (81 mg total) by mouth daily. Swallow whole. 30 tablet 11   clopidogrel (PLAVIX) 75 MG tablet Take 1 tablet (75 mg total) by mouth daily. 30 tablet 1   furosemide (LASIX) 20 MG tablet Take 20 mg by mouth daily.     gabapentin (NEURONTIN) 300 MG capsule Take 600 mg by mouth 2 (two) times daily.     insulin aspart (NOVOLOG) 100 UNIT/ML injection Inject 0-24 Units into the skin 3 (three) times daily with meals. 10 mL 11   Insulin Glargine (BASAGLAR KWIKPEN) 100 UNIT/ML Inject 20 Units into the skin daily. 30 mL 0   loratadine (CLARITIN) 10 MG tablet Take 1 tablet (10 mg total) by mouth daily. 30 tablet 0   menthol-cetylpyridinium (CEPACOL) 3 MG lozenge Take 1 lozenge (3  mg total) by mouth as needed for sore throat. 100 tablet 12   metFORMIN (GLUCOPHAGE-XR) 500 MG 24 hr tablet Take 1,000 mg by mouth in the morning and at bedtime.     metoprolol tartrate (LOPRESSOR) 25 MG tablet Take 1 tablet (25 mg total) by mouth 2 (two) times daily. 60 tablet 0   polyethylene glycol (MIRALAX / GLYCOLAX) 17 g packet Take 17 g by mouth daily as needed for mild constipation.     rosuvastatin (CRESTOR) 20 MG tablet Take 1 tablet (20 mg total) by mouth daily. 90 tablet 3   tamsulosin (FLOMAX) 0.4 MG CAPS capsule Take 1 capsule (0.4 mg total) by mouth daily. 30 capsule 0   Tetrahydrozoline HCl (VISINE EXTRA OP) Place 1 drop into both eyes as needed (itching).     umeclidinium-vilanterol (ANORO ELLIPTA) 62.5-25 MCG/INH AEPB Inhale 1 puff into the lungs daily. 1 each 12   Vitamin D, Ergocalciferol, (DRISDOL) 1.25 MG (50000 UNIT) CAPS capsule Take 50,000 Units by mouth once a week.     zolpidem (AMBIEN) 10 MG tablet Take 10 mg by mouth at bedtime.    Vital Signs: Vitals:   03/07/21 1507  BP: 132/73  Pulse: 88  Resp: 20  SpO2: 93%  Physical Exam: CV-RRR Pulmonary-clear to auscultation bilaterally Abdomen-Soft non tender, bowel sounds present Extremities-No LE edema Wounds-Sternal and bilateral thigh incisions clean, dry, well healed  Diagnostic Tests: Narrative & Impression  CLINICAL DATA:  Postop from CABG.   EXAM: CHEST - 2 VIEW   COMPARISON:  01/29/2021 from [facility]   FINDINGS: The heart size and mediastinal contours are within normal limits. Patient has undergone CABG, and cervical spine fusion hardware is also noted. Both lungs are clear. Previously seen bilateral pleural effusions have now resolved.   IMPRESSION: No active cardiopulmonary disease.     Electronically Signed   By: Marlaine Hind M.D.   On: 03/07/2021 15:00    Impression and Plan: Overall, Mr. Koller is continuing to recover from coronary artery bypass grafting surgery. He  asked for pain medication. I discussed that he would not receive anymore Oxy. I sent a prescription for Ultram to his pharmacy (#20, no refills). He has not seen cardiology yet but will be seen by Laurann Montana from Cardiology 12/02 at 1:55 pm. Patient states glucose under better control. He has already had a follow up with PCP. We discussed the importance of good glucose control and decreasing HGA1C to 7 or less. Patient was instructed to continue with sternal precautions (I.e. no lifting more than 10 pounds) for the next 4 weeks. Once the patient is NOT taking any narcotic for pain, he was instructed he may begin driving short distances (I.e. 30 minutes or less during the day) and may gradually increase the frequency and duration as tolerates. We encourage the Patient to participate in cardiac rehab;he does wish to do so on about 1-2 more weeks. At patient request, he will return to see Dr. Kipp Brood in 4 weeks.     Nani Skillern, PA-C [facility] [phone]

## 2021-03-09 DIAGNOSIS — E1165 Type 2 diabetes mellitus with hyperglycemia: Secondary | ICD-10-CM | POA: Diagnosis not present

## 2021-03-09 DIAGNOSIS — R0902 Hypoxemia: Secondary | ICD-10-CM | POA: Diagnosis not present

## 2021-03-09 DIAGNOSIS — T82855D Stenosis of coronary artery stent, subsequent encounter: Secondary | ICD-10-CM | POA: Diagnosis not present

## 2021-03-09 DIAGNOSIS — J449 Chronic obstructive pulmonary disease, unspecified: Secondary | ICD-10-CM | POA: Diagnosis not present

## 2021-03-09 DIAGNOSIS — I10 Essential (primary) hypertension: Secondary | ICD-10-CM | POA: Diagnosis not present

## 2021-03-09 DIAGNOSIS — I2510 Atherosclerotic heart disease of native coronary artery without angina pectoris: Secondary | ICD-10-CM | POA: Diagnosis not present

## 2021-03-14 DIAGNOSIS — T82855D Stenosis of coronary artery stent, subsequent encounter: Secondary | ICD-10-CM | POA: Diagnosis not present

## 2021-03-14 DIAGNOSIS — R0902 Hypoxemia: Secondary | ICD-10-CM | POA: Diagnosis not present

## 2021-03-14 DIAGNOSIS — I2510 Atherosclerotic heart disease of native coronary artery without angina pectoris: Secondary | ICD-10-CM | POA: Diagnosis not present

## 2021-03-14 DIAGNOSIS — I10 Essential (primary) hypertension: Secondary | ICD-10-CM | POA: Diagnosis not present

## 2021-03-14 DIAGNOSIS — E1165 Type 2 diabetes mellitus with hyperglycemia: Secondary | ICD-10-CM | POA: Diagnosis not present

## 2021-03-14 DIAGNOSIS — J449 Chronic obstructive pulmonary disease, unspecified: Secondary | ICD-10-CM | POA: Diagnosis not present

## 2021-03-15 ENCOUNTER — Other Ambulatory Visit: Payer: Self-pay

## 2021-03-15 DIAGNOSIS — I2510 Atherosclerotic heart disease of native coronary artery without angina pectoris: Secondary | ICD-10-CM | POA: Diagnosis not present

## 2021-03-15 DIAGNOSIS — R0902 Hypoxemia: Secondary | ICD-10-CM | POA: Diagnosis not present

## 2021-03-15 DIAGNOSIS — J449 Chronic obstructive pulmonary disease, unspecified: Secondary | ICD-10-CM | POA: Diagnosis not present

## 2021-03-15 DIAGNOSIS — E1165 Type 2 diabetes mellitus with hyperglycemia: Secondary | ICD-10-CM | POA: Diagnosis not present

## 2021-03-15 DIAGNOSIS — I10 Essential (primary) hypertension: Secondary | ICD-10-CM | POA: Diagnosis not present

## 2021-03-15 DIAGNOSIS — T82855D Stenosis of coronary artery stent, subsequent encounter: Secondary | ICD-10-CM | POA: Diagnosis not present

## 2021-03-16 DIAGNOSIS — I2510 Atherosclerotic heart disease of native coronary artery without angina pectoris: Secondary | ICD-10-CM | POA: Diagnosis not present

## 2021-03-16 DIAGNOSIS — T82855D Stenosis of coronary artery stent, subsequent encounter: Secondary | ICD-10-CM | POA: Diagnosis not present

## 2021-03-16 DIAGNOSIS — E1165 Type 2 diabetes mellitus with hyperglycemia: Secondary | ICD-10-CM | POA: Diagnosis not present

## 2021-03-16 DIAGNOSIS — I10 Essential (primary) hypertension: Secondary | ICD-10-CM | POA: Diagnosis not present

## 2021-03-16 DIAGNOSIS — J449 Chronic obstructive pulmonary disease, unspecified: Secondary | ICD-10-CM | POA: Diagnosis not present

## 2021-03-16 DIAGNOSIS — R0902 Hypoxemia: Secondary | ICD-10-CM | POA: Diagnosis not present

## 2021-03-17 DIAGNOSIS — J449 Chronic obstructive pulmonary disease, unspecified: Secondary | ICD-10-CM | POA: Diagnosis not present

## 2021-03-17 DIAGNOSIS — I2510 Atherosclerotic heart disease of native coronary artery without angina pectoris: Secondary | ICD-10-CM | POA: Diagnosis not present

## 2021-03-17 DIAGNOSIS — I10 Essential (primary) hypertension: Secondary | ICD-10-CM | POA: Diagnosis not present

## 2021-03-17 DIAGNOSIS — E1165 Type 2 diabetes mellitus with hyperglycemia: Secondary | ICD-10-CM | POA: Diagnosis not present

## 2021-03-17 DIAGNOSIS — R0902 Hypoxemia: Secondary | ICD-10-CM | POA: Diagnosis not present

## 2021-03-17 DIAGNOSIS — T82855D Stenosis of coronary artery stent, subsequent encounter: Secondary | ICD-10-CM | POA: Diagnosis not present

## 2021-03-18 ENCOUNTER — Other Ambulatory Visit: Payer: Self-pay

## 2021-03-18 ENCOUNTER — Encounter (HOSPITAL_BASED_OUTPATIENT_CLINIC_OR_DEPARTMENT_OTHER): Payer: Self-pay | Admitting: Family

## 2021-03-18 ENCOUNTER — Ambulatory Visit (INDEPENDENT_AMBULATORY_CARE_PROVIDER_SITE_OTHER): Payer: Medicare Other | Admitting: Family

## 2021-03-18 VITALS — BP 110/60 | HR 71 | Ht 65.0 in | Wt 174.2 lb

## 2021-03-18 DIAGNOSIS — E785 Hyperlipidemia, unspecified: Secondary | ICD-10-CM | POA: Diagnosis not present

## 2021-03-18 DIAGNOSIS — E1142 Type 2 diabetes mellitus with diabetic polyneuropathy: Secondary | ICD-10-CM

## 2021-03-18 DIAGNOSIS — R051 Acute cough: Secondary | ICD-10-CM

## 2021-03-18 DIAGNOSIS — I25118 Atherosclerotic heart disease of native coronary artery with other forms of angina pectoris: Secondary | ICD-10-CM

## 2021-03-18 DIAGNOSIS — Z794 Long term (current) use of insulin: Secondary | ICD-10-CM

## 2021-03-18 MED ORDER — BENZONATATE 100 MG PO CAPS
100.0000 mg | ORAL_CAPSULE | Freq: Three times a day (TID) | ORAL | 0 refills | Status: DC | PRN
  Filled 2021-07-05: qty 30, 10d supply, fill #0

## 2021-03-18 MED ORDER — METOPROLOL TARTRATE 25 MG PO TABS: 12.5000 mg | ORAL_TABLET | Freq: Two times a day (BID) | ORAL | 1 refills | Status: DC

## 2021-03-18 NOTE — Patient Instructions (Signed)
Medication Instructions:  Your physician has recommended you make the following change in your medication:    Please make sure you are not taking Furosemide (Lasix)  CHANGE Metoprolol to half tablet (12.5mg ) twice per day. *Ensure doses are 12 hours apart. *this is to help prevent low blood pressure  Recommend trying Coricidin HBP Cold & Flu for your current symptoms. This is a cold medications specifically made for individuals with cardiac history.   *If you need a refill on your cardiac medications before your next appointment, please call your pharmacy*   Lab Work: Your physician recommends that you return for lab work today: CBC, BMP  If you have labs (blood work) drawn today and your tests are completely normal, you will receive your results only by: [place] (if you have [place]) OR A paper copy in the mail If you have any lab test that is abnormal or we need to change your treatment, we will call you to review the results.   Testing/Procedures: Your EKG today showed normal sinus rhythm which is a good result.   Follow-Up: At [facility], you and your health needs are our priority.  As part of our continuing mission to provide you with exceptional heart care, we have created designated Provider Care Teams.  These Care Teams include your primary Cardiologist (physician) and Advanced Practice Providers (APPs -  Physician Assistants and Nurse Practitioners) who all work together to provide you with the care you need, when you need it.  We recommend signing up for the patient portal called "MyChart".  Sign up information is provided on this After Visit Summary.  MyChart is used to connect with patients for Virtual Visits (Telemedicine).  Patients are able to view lab/test results, encounter notes, upcoming appointments, etc.  Non-urgent messages can be sent to your provider as well.   To learn more about what you can do with MyChart, go to [email or web address].    Your  next appointment:   2-3 month(s)  The format for your next appointment:   In Person  Provider:   Glenetta Hew, MD or Advanced Practice Provider    Other Instructions  Recommend using a heat pack on your chest for discomfort. It is likely from the coughing.   Heart Healthy Diet Recommendations: A low-salt diet is recommended. Meats should be grilled, baked, or boiled. Avoid fried foods. Focus on lean protein sources like fish or chicken with vegetables and fruits. The American Heart Association is a Microbiologist!  American Heart Association Diet and Lifeystyle Recommendations    Exercise recommendations: The American Heart Association recommends 150 minutes of moderate intensity exercise weekly. Try 30 minutes of moderate intensity exercise 4-5 times per week. This could include walking, jogging, or swimming.  For coronary artery disease often called "heart disease" we aim for optimal guideline directed medical therapy. We use the "A, B, C"s to help keep [place] on track!  A = Aspirin 81mg  daily B = Beta blocker which helps to relax the heart. This is your Metoprolol. C = Cholesterol control. You take Rosuvastatin to help control your cholesterol.  D = Don't forget nitroglycerin! This is an emergency tablet to be used if you have chest pain. E = Extras. In your case, this is Plavix to protect your bypass grafts.

## 2021-03-18 NOTE — Progress Notes (Signed)
Office Visit    Patient Name: Paul [name] Date of Encounter: 03/18/2021  PCP:  Leanna Battles, [facility]  Cardiologist:  Glenetta Hew, MD  Advanced Practice Provider:  No care team member to display Electrophysiologist:  None      Chief Complaint    Paul [name] is a 70 y.o. male with a hx of CAD s/p PCI and subseqent CABG 01/25/21 [facility] LAD (free graft, jumped off the vein graft proximally), RSVG OM], DM2, HLD presents today for follow-up after CABG  Past Medical History    Past Medical History:  Diagnosis Date   CAD 11/24/2011   a) Mild-to-moderate 30-40% lesions in the RCA, LAD and Circumflex. b) CULPRIT LESION: long tubular 70-80% lesion in D1 with FFR of 0.7 --> PCI w/ Xience Xpedition DES 2.5 mm x 30 mm (2.65 MM); c) Lexiscan Myoview 11/2013: No Ischemia or Infarct (Inferior Gut Attenuation) EF 63%.   COPD (chronic obstructive pulmonary disease) ([place])    "don't have full case of it; I'm right there at it"   Diabetes mellitus without complication ([place])    Essential hypertension 11/24/2011   Exertional dyspnea, chronic    Gout    Hyperlipidemia with target LDL less than 70 11/24/2011   Obesity (BMI 30.0-34.9)    OSA (obstructive sleep apnea), uses oxygen at home did not tolerate cpap 11/24/2011   S/P CABG (coronary artery bypass graft)    Past Surgical History:  Procedure Laterality Date   CERVICAL SPINE SURGERY  2012   CORONARY ANGIOPLASTY WITH STENT PLACEMENT  11/23/2011   "1; first one"   CORONARY ARTERY BYPASS GRAFT N/A 01/25/2021   Procedure: CORONARY ARTERY BYPASS GRAFTING (CABG) X2, USING LEFT INTERNAL MAMMARY ARTERY AND LEFT LEG GREATER SAPHENOUS VEIN HARVESTED ENDOSCOPICALLY;  Surgeon: Lajuana Matte, MD;  Location: [place];  Service: Open Heart Surgery;  Laterality: N/A;   ENDOVEIN HARVEST OF GREATER SAPHENOUS VEIN Bilateral 01/25/2021   Procedure: ENDOVEIN HARVEST OF GREATER SAPHENOUS VEIN;  Surgeon: Lajuana Matte,  MD;  Location: [place];  Service: Open Heart Surgery;  Laterality: Bilateral;   LEFT HEART CATH AND CORONARY ANGIOGRAPHY N/A 12/28/2020   Procedure: LEFT HEART CATH AND CORONARY ANGIOGRAPHY;  Surgeon: Leonie Man, MD;  Location: [place] CV [facility];  Service: Cardiovascular;  Laterality: N/A;   LEFT HEART CATHETERIZATION WITH CORONARY ANGIOGRAM N/A 11/23/2011   Procedure: LEFT HEART CATHETERIZATION WITH CORONARY ANGIOGRAM;  Surgeon: Leonie Man, MD;  Location: [facility] CATH LAB;  Service: Cardiovascular;  Laterality: N/A;   TEE WITHOUT CARDIOVERSION N/A 01/25/2021   Procedure: TRANSESOPHAGEAL ECHOCARDIOGRAM (TEE);  Surgeon: Lajuana Matte, MD;  Location: [facility];  Service: Open Heart Surgery;  Laterality: N/A;    Allergies  Allergies  Allergen Reactions   Morphine And Related Itching    Possible itching due to morphine 01/26/21   Other Other (See Comments)    Cats- eyes burn    History of Present Illness    Paul [name] is a 70 y.o. male with a hx of CAD s/p PCI and subseqent CABG 01/25/21 [[facility] LAD (free graft, jumped off the vein graft proximally), RSVG OM], DM2, HLD last seen while hospitalized.  He was seen 12/13/2020 by Dr. Martinique noting increasing precordial pain and exertional dyspnea.  He was recommended for cardiac catheterization.  This was performed by Dr. Ellyn Hack 12/28/2020.  Cardiac catheterization showed extensive LAD and circumflex disease with occluded diagonal recommended for cardiothoracic surgery.  He was evaluated  in the outpatient setting by Dr. Kipp Brood and subsequently admitted 01/25/2021 for CABG X2.  He had expected postoperative blood loss anemia.  He had thrombocytopenia with platelet count as low as 94,000.  He was volume overloaded and diuresed.  He was started on Flomax due to difficulty voiding.  01/30/21 he did have an episode where he fell off a chair after coughing episode with small laceration to nose but otherwise neurologically intact.  Head CT negative for  intracranial injury.  He was discharged to SNF.  Presents today for follow-up. He presents today with his caretaker Helene Kelp (tells me she is his ex son in [place] new wife and she and her husband have been helping care for him since surgery. Notes being sick over the last week with productive (white phlegm) cough, fatigue, occasional wheeze. No known sick contacts nore fever. Notes chest soreness from coug which is tender on palpitations. Tells me Tramadol not effective for pain and that Oxycodone was more effective. Per TCTS notes no plans to resume oxycodone. No exertional chest discomfort, exertional dyspnea, lightheadedness, dizziness, edema, orthopnea, PND. Endorses generalized fatigue.    EKGs/Labs/Other Studies Reviewed:   The following studies were reviewed today:  LHC 12/28/2020  Conclusion      Prox LAD to Mid LAD lesion is 65% stenosed with 30% stenosed side branch in 1st Diag. (RFR 0.90-0.91)   1st Diag lesion is 100% stenosed. (In-stent Re-stenosis/occlusion)   Mid LAD lesion is 40% stenosed.   Mid LAD to Dist LAD lesion is 75% stenosed. (RFR 0.74)   Ost Cx to Mid Cx lesion is 55% stenosed with 20% stenosed side branch in 1st Mrg.   Mid Cx lesion is 90% stenosed.   Prox RCA lesion is 40% stenosed.   -------------------------------------------   The left ventricular systolic function is normal.  The left ventricular ejection fraction is 50-55% by visual estimate.   LV end diastolic pressure is normal.   There is no aortic valve stenosis.    SUMMARY Severe 3 Vessel CAD:  100% ISR of Major D1 stent,  focal 65% prox LAD (@ D1) lesion - FFR 0.91 followed by 40% LAD @ SP1 then long 70% mid LAD (FFR 0.74 - SIGNIFICANT).  prox-mid LCx diffuse 55% with focal 90% just after AVG branch in LCx-Lateral OM2. Moderate Prox-mid RCA 35-40% @ bend  Poor LV Gram image, but appears to have preserved LVEF with normal LVEDP (2 D Echo ordered).      RECOMMENDATIONS With extensive LAD and LCx  disease and occluded diagonal, best treatment option would likely be CVTS.  He has been on Plavix since his PCI. CVTS consult placed, 2D echo ordered, and Plavix discontinued. Anticipate outpatient CVTS clinic visit with staged CABG after Plavix washout -> otherwise would need to consider extensive PCI of the LAD with focal PCI of LCx and attempted recanalization of occluded D1 stent Will need to continue aggressive risk factor modification/Guideline Directed Medical Management  Echo 12/31/2020 1. Left ventricular ejection fraction, by estimation, is 60 to 65%. The  left ventricle has normal function. The left ventricle has no regional  wall motion abnormalities. Left ventricular diastolic parameters are  consistent with Grade I diastolic  dysfunction (impaired relaxation).   2. Right ventricular systolic function is normal. The right ventricular  size is normal.   3. The mitral valve is normal in structure. No evidence of mitral valve  regurgitation. No evidence of mitral stenosis.   4. The aortic valve is normal in structure. Aortic  valve regurgitation is  not visualized. No aortic stenosis is present.   5. The inferior vena cava is normal in size with greater than 50%  respiratory variability, suggesting right atrial pressure of 3 mmHg.   Doppler 01/21/2021  Summary:  Right Carotid: Velocities in the right ICA are consistent with a 1-39%  stenosis.   Left Carotid: Velocities in the left ICA are consistent with a 1-39%  stenosis.  Vertebrals: Bilateral vertebral arteries demonstrate antegrade flow.   Right ABI: Resting right ankle-brachial index is within normal range. No  evidence of significant right lower extremity arterial disease.  Left ABI: Resting left ankle-brachial index is within normal range. No  evidence of significant left lower extremity arterial disease.  Right Upper Extremity: Doppler waveforms decrease >50% with right radial  compression. Doppler waveforms remain  within normal limits with right  ulnar compression.  Left Upper Extremity: Doppler waveform obliterate with left radial  compression. Doppler waveforms remain within normal limits with left ulnar  compression.    EKG:  EKG is  ordered today.  The ekg ordered today demonstrates NSR 71 bpm with no acute ST/T wave changes   Recent Labs: 07/14/2020: TSH 2.852 01/21/2021: ALT 26 01/26/2021: Magnesium 2.1 01/29/2021: Hemoglobin 8.0; Platelets 127 02/02/2021: BUN 32; Creatinine, Ser 1.31; Potassium 4.3; Sodium 136  Recent Lipid Panel    Component Value Date/Time   CHOL 119 12/21/2020 0824   TRIG 201 (H) 12/21/2020 0824   HDL 24 (L) 12/21/2020 0824   CHOLHDL 5.0 12/21/2020 0824   CHOLHDL 6.6 11/23/2011 0846   VLDL 29 11/23/2011 0846   LDLCALC 61 12/21/2020 0824    Home Medications   Current Meds  Medication Sig   acetaminophen (TYLENOL) 500 MG tablet Take 1,500-2,000 mg by mouth daily.   albuterol (PROVENTIL) (2.5 MG/3ML) 0.083% nebulizer solution Take 2.5 mg by nebulization every 4 (four) hours as needed for wheezing or shortness of breath.   albuterol (VENTOLIN HFA) 108 (90 Base) MCG/ACT inhaler Inhale 2 puffs into the lungs every 4 (four) hours as needed for wheezing or shortness of breath.   allopurinol (ZYLOPRIM) 100 MG tablet Take 100 mg by mouth daily.   aspirin EC 81 MG EC tablet Take 1 tablet (81 mg total) by mouth daily. Swallow whole.   clopidogrel (PLAVIX) 75 MG tablet Take 1 tablet (75 mg total) by mouth daily.   furosemide (LASIX) 20 MG tablet Take 20 mg by mouth daily.   gabapentin (NEURONTIN) 300 MG capsule Take 600 mg by mouth 2 (two) times daily.   insulin aspart (NOVOLOG) 100 UNIT/ML injection Inject 0-24 Units into the skin 3 (three) times daily with meals.   Insulin Glargine (BASAGLAR KWIKPEN) 100 UNIT/ML Inject 20 Units into the skin daily.   loratadine (CLARITIN) 10 MG tablet Take 1 tablet (10 mg total) by mouth daily.   menthol-cetylpyridinium (CEPACOL) 3 MG  lozenge Take 1 lozenge (3 mg total) by mouth as needed for sore throat.   metFORMIN (GLUCOPHAGE) 1000 MG tablet Take 1,000 mg by mouth 2 (two) times daily.   metoprolol tartrate (LOPRESSOR) 25 MG tablet Take 1 tablet (25 mg total) by mouth 2 (two) times daily.   nitroGLYCERIN (NITROSTAT) 0.4 MG SL tablet one tablet under tongue, q5 minutes for chest pain   polyethylene glycol (MIRALAX / GLYCOLAX) 17 g packet Take 17 g by mouth daily as needed for mild constipation.   tamsulosin (FLOMAX) 0.4 MG CAPS capsule Take 1 capsule (0.4 mg total) by mouth daily.  Tetrahydrozoline HCl (VISINE EXTRA OP) Place 1 drop into both eyes as needed (itching).   traMADol (ULTRAM) 50 MG tablet Take 1 tablet (50 mg total) by mouth every 6 (six) hours as needed.   umeclidinium-vilanterol (ANORO ELLIPTA) 62.5-25 MCG/INH AEPB Inhale 1 puff into the lungs daily.   Vitamin D, Ergocalciferol, (DRISDOL) 1.25 MG (50000 UNIT) CAPS capsule Take 50,000 Units by mouth once a week.   zolpidem (AMBIEN) 10 MG tablet Take 10 mg by mouth at bedtime.     Review of Systems      All other systems reviewed and are otherwise negative except as noted above.  Physical Exam    VS:  BP 110/60 (BP Location: Left Arm, Patient Position: Sitting, Cuff Size: Normal)   Pulse 71   Ht 5\' 5"  (1.651 m)   Wt 174 lb 3.2 oz (79 kg)   BMI 28.99 kg/m  , BMI Body mass index is 28.99 kg/m.  Wt Readings from Last 3 Encounters:  03/18/21 174 lb 3.2 oz (79 kg)  03/07/21 178 lb 9.6 oz (81 kg)  02/02/21 184 lb 3.2 oz (83.6 kg)     GEN: Well nourished, well developed, in no acute distress. HEENT: normal. Neck: Supple, no JVD, carotid bruits, or masses. Cardiac: RRR, no murmurs, rubs, or gallops. No clubbing, cyanosis, edema.  Radials/PT 2+ and equal bilaterally.  Respiratory:  Respirations regular and unlabored, clear to auscultation bilaterally. GI: Soft, nontender, nondistended. MS: No deformity or atrophy. Skin: Warm and dry, no rash.  Midsternal incision clean, dry, intact and healed with no scab. Chest wall tender on palpation bilaterally.  Neuro:  Strength and sensation are intact. Psych: Normal affect.  Assessment & Plan    CAD s/p PCI and subsequent CABG (LIMA-LAD, RSVG-OM) - EKG no acute changes. Midsternal incision healing appropriately. Chest wall tender on palpation due to recent cough. Atypical for angina. GDMT includes aspirin, metorpolol, plavix, rosuvastatin. Given fatigue will reduce Metoprolol tartrate to 12.5mg  BID. Heart healthy diet and regular cardiovascular exercise encouraged.      Cardiac Rehabilitation Eligibility Assessment  The patient is ready to start cardiac rehabilitation from a cardiac standpoint.      Cough / URI - 1 week history productive cough with white phlegm. No dyspnea, fever. Occasional wheeze. Encouraged to use OTC Coricidin. Will Rx Tessalon 100mg  TID PRN for cough 20 tablets with further refills through PCP. Encouraged to contact PCP. CBC collected today to rule out significant infection. He has been vaccinated against flu and pneumonia. Educated to report to urgent care for worsening symptoms.   HLD, LDL goal <70 - 12/2020 LDL 61. Continue Rosuvastatin 20mg  QD. Denies myalgias.  DM2 - Continue to follow with PCP. Suboptimally controlled with 01/21/21 A1c 8.8. Reports out of his long acting insulin and has been for 3 days. WIth no long acting insulin and recent illness concern for hyperglycemia. Stat BMP ordered today to rule out DKA. After multiple calls to both his PCP office and pharmacy were able to determine that his Basgalar required prior auth which had not been completed. I was routed by PCP office to nurse's line where I had to leave a VM and was repeatedly assured that it would e addressed by end of day.   Carotid artery stenosis - 01/21/21 duplex bilateral 1-39% stenosis. Continue aspirin, statin. No amaurosis fugax.   Disposition: Follow up in 2 month(s) with Glenetta Hew,  MD or APP.  Signed, Loel Dubonnet, NP 03/18/2021, 2:27 PM [facility]  

## 2021-03-20 DIAGNOSIS — R0902 Hypoxemia: Secondary | ICD-10-CM | POA: Diagnosis not present

## 2021-03-20 DIAGNOSIS — I2510 Atherosclerotic heart disease of native coronary artery without angina pectoris: Secondary | ICD-10-CM | POA: Diagnosis not present

## 2021-03-20 DIAGNOSIS — E785 Hyperlipidemia, unspecified: Secondary | ICD-10-CM | POA: Diagnosis not present

## 2021-03-20 DIAGNOSIS — I10 Essential (primary) hypertension: Secondary | ICD-10-CM | POA: Diagnosis not present

## 2021-03-20 DIAGNOSIS — Z7984 Long term (current) use of oral hypoglycemic drugs: Secondary | ICD-10-CM | POA: Diagnosis not present

## 2021-03-20 DIAGNOSIS — Z794 Long term (current) use of insulin: Secondary | ICD-10-CM | POA: Diagnosis not present

## 2021-03-20 DIAGNOSIS — Z951 Presence of aortocoronary bypass graft: Secondary | ICD-10-CM | POA: Diagnosis not present

## 2021-03-20 DIAGNOSIS — M1990 Unspecified osteoarthritis, unspecified site: Secondary | ICD-10-CM | POA: Diagnosis not present

## 2021-03-20 DIAGNOSIS — K219 Gastro-esophageal reflux disease without esophagitis: Secondary | ICD-10-CM | POA: Diagnosis not present

## 2021-03-20 DIAGNOSIS — J449 Chronic obstructive pulmonary disease, unspecified: Secondary | ICD-10-CM | POA: Diagnosis not present

## 2021-03-20 DIAGNOSIS — R1310 Dysphagia, unspecified: Secondary | ICD-10-CM | POA: Diagnosis not present

## 2021-03-20 DIAGNOSIS — D62 Acute posthemorrhagic anemia: Secondary | ICD-10-CM | POA: Diagnosis not present

## 2021-03-20 DIAGNOSIS — T82855D Stenosis of coronary artery stent, subsequent encounter: Secondary | ICD-10-CM | POA: Diagnosis not present

## 2021-03-20 DIAGNOSIS — Z4800 Encounter for change or removal of nonsurgical wound dressing: Secondary | ICD-10-CM | POA: Diagnosis not present

## 2021-03-20 DIAGNOSIS — G4733 Obstructive sleep apnea (adult) (pediatric): Secondary | ICD-10-CM | POA: Diagnosis not present

## 2021-03-20 DIAGNOSIS — Z9181 History of falling: Secondary | ICD-10-CM | POA: Diagnosis not present

## 2021-03-20 DIAGNOSIS — E1165 Type 2 diabetes mellitus with hyperglycemia: Secondary | ICD-10-CM | POA: Diagnosis not present

## 2021-03-21 DIAGNOSIS — I2510 Atherosclerotic heart disease of native coronary artery without angina pectoris: Secondary | ICD-10-CM | POA: Diagnosis not present

## 2021-03-21 DIAGNOSIS — I10 Essential (primary) hypertension: Secondary | ICD-10-CM | POA: Diagnosis not present

## 2021-03-21 DIAGNOSIS — E1165 Type 2 diabetes mellitus with hyperglycemia: Secondary | ICD-10-CM | POA: Diagnosis not present

## 2021-03-21 DIAGNOSIS — J449 Chronic obstructive pulmonary disease, unspecified: Secondary | ICD-10-CM | POA: Diagnosis not present

## 2021-03-21 DIAGNOSIS — I25118 Atherosclerotic heart disease of native coronary artery with other forms of angina pectoris: Secondary | ICD-10-CM | POA: Diagnosis not present

## 2021-03-21 DIAGNOSIS — R0902 Hypoxemia: Secondary | ICD-10-CM | POA: Diagnosis not present

## 2021-03-21 DIAGNOSIS — T82855D Stenosis of coronary artery stent, subsequent encounter: Secondary | ICD-10-CM | POA: Diagnosis not present

## 2021-03-21 LAB — CBC
Hematocrit: 35.4 % — ABNORMAL LOW (ref 37.5–51.0)
Hemoglobin: 10.7 g/dL — ABNORMAL LOW (ref 13.0–17.7)
MCH: 25.6 pg — ABNORMAL LOW (ref 26.6–33.0)
MCHC: 30.2 g/dL — ABNORMAL LOW (ref 31.5–35.7)
MCV: 85 fL (ref 79–97)
Platelets: 166 10*3/uL (ref 150–450)
RBC: 4.18 x10E6/uL (ref 4.14–5.80)
RDW: 14.3 % (ref 11.6–15.4)
WBC: 6.1 10*3/uL (ref 3.4–10.8)

## 2021-03-21 LAB — BASIC METABOLIC PANEL
BUN/Creatinine Ratio: 16 (ref 10–24)
BUN: 14 mg/dL (ref 8–27)
CO2: 20 mmol/L (ref 20–29)
Calcium: 9.4 mg/dL (ref 8.6–10.2)
Chloride: 107 mmol/L — ABNORMAL HIGH (ref 96–106)
Creatinine, Ser: 0.89 mg/dL (ref 0.76–1.27)
Glucose: 81 mg/dL (ref 70–99)
Potassium: 4.7 mmol/L (ref 3.5–5.2)
Sodium: 140 mmol/L (ref 134–144)
eGFR: 91 mL/min/{1.73_m2} (ref >59)

## 2021-03-22 ENCOUNTER — Telehealth (HOSPITAL_BASED_OUTPATIENT_CLINIC_OR_DEPARTMENT_OTHER): Payer: Self-pay | Admitting: Family

## 2021-03-22 DIAGNOSIS — I10 Essential (primary) hypertension: Secondary | ICD-10-CM | POA: Diagnosis not present

## 2021-03-22 DIAGNOSIS — E1165 Type 2 diabetes mellitus with hyperglycemia: Secondary | ICD-10-CM | POA: Diagnosis not present

## 2021-03-22 DIAGNOSIS — I2510 Atherosclerotic heart disease of native coronary artery without angina pectoris: Secondary | ICD-10-CM | POA: Diagnosis not present

## 2021-03-22 DIAGNOSIS — T82855D Stenosis of coronary artery stent, subsequent encounter: Secondary | ICD-10-CM | POA: Diagnosis not present

## 2021-03-22 DIAGNOSIS — J449 Chronic obstructive pulmonary disease, unspecified: Secondary | ICD-10-CM | POA: Diagnosis not present

## 2021-03-22 DIAGNOSIS — R0902 Hypoxemia: Secondary | ICD-10-CM | POA: Diagnosis not present

## 2021-03-22 NOTE — Telephone Encounter (Signed)
New Message:     Please call Paul [name], she needs to go over patient's medicine with you.

## 2021-03-22 NOTE — Progress Notes (Signed)
Called pt. No answer VM full. Will try again later

## 2021-03-22 NOTE — Telephone Encounter (Signed)
Spoke with Judson Roch from home health agency.  Noted the patient is taking amlodipine 5 mg daily as prescribed by primary care.  Blood pressure today on her check 132/74.  Recommend continue and we will add to his medication list. Confirmed Metoprolol Tartrate 12.5mg  BID.  Tells me his weight when checked 12/4 was 173 pounds, the next day 174.8 pounds, weight today 173 pounds.  We discussed normal LVEF by echocardiogram and mild grade 1 diastolic dysfunction.  Low suspicion for fluid retention.  Encouraged daily weights and contacting us if weight increases 2 pounds overnight or 5 pounds in 1 week.  Confirm that he has no longer taking furosemide.  Patient was under the impression that we had discontinued his Flomax which was started during admission for CABG due to urinary retention.  Encouraged to discuss with primary care provider.  Loel Dubonnet, NP

## 2021-03-24 DIAGNOSIS — T82855D Stenosis of coronary artery stent, subsequent encounter: Secondary | ICD-10-CM | POA: Diagnosis not present

## 2021-03-24 DIAGNOSIS — J449 Chronic obstructive pulmonary disease, unspecified: Secondary | ICD-10-CM | POA: Diagnosis not present

## 2021-03-24 DIAGNOSIS — I10 Essential (primary) hypertension: Secondary | ICD-10-CM | POA: Diagnosis not present

## 2021-03-24 DIAGNOSIS — I2510 Atherosclerotic heart disease of native coronary artery without angina pectoris: Secondary | ICD-10-CM | POA: Diagnosis not present

## 2021-03-24 DIAGNOSIS — E1165 Type 2 diabetes mellitus with hyperglycemia: Secondary | ICD-10-CM | POA: Diagnosis not present

## 2021-03-24 DIAGNOSIS — R0902 Hypoxemia: Secondary | ICD-10-CM | POA: Diagnosis not present

## 2021-03-24 NOTE — Progress Notes (Signed)
Called pt. To review his most recent lab results. Pt. Endorses understanding and states he is feeling much better and finally was able to get his insulin yesterday 12/7.    "Normal kidney function. CBC shows anemia improving. No evidence of significant infection. Good result. Continue current medications."

## 2021-03-28 DIAGNOSIS — Z9861 Coronary angioplasty status: Secondary | ICD-10-CM | POA: Diagnosis not present

## 2021-03-28 DIAGNOSIS — K219 Gastro-esophageal reflux disease without esophagitis: Secondary | ICD-10-CM | POA: Diagnosis not present

## 2021-03-28 DIAGNOSIS — E1151 Type 2 diabetes mellitus with diabetic peripheral angiopathy without gangrene: Secondary | ICD-10-CM | POA: Diagnosis not present

## 2021-03-28 DIAGNOSIS — G629 Polyneuropathy, unspecified: Secondary | ICD-10-CM | POA: Diagnosis not present

## 2021-03-28 DIAGNOSIS — J449 Chronic obstructive pulmonary disease, unspecified: Secondary | ICD-10-CM | POA: Diagnosis not present

## 2021-03-28 DIAGNOSIS — Z794 Long term (current) use of insulin: Secondary | ICD-10-CM | POA: Diagnosis not present

## 2021-03-28 DIAGNOSIS — R2681 Unsteadiness on feet: Secondary | ICD-10-CM | POA: Diagnosis not present

## 2021-03-28 DIAGNOSIS — I10 Essential (primary) hypertension: Secondary | ICD-10-CM | POA: Diagnosis not present

## 2021-03-28 DIAGNOSIS — I739 Peripheral vascular disease, unspecified: Secondary | ICD-10-CM | POA: Diagnosis not present

## 2021-03-29 DIAGNOSIS — I2510 Atherosclerotic heart disease of native coronary artery without angina pectoris: Secondary | ICD-10-CM | POA: Diagnosis not present

## 2021-03-29 DIAGNOSIS — T82855D Stenosis of coronary artery stent, subsequent encounter: Secondary | ICD-10-CM | POA: Diagnosis not present

## 2021-03-29 DIAGNOSIS — I10 Essential (primary) hypertension: Secondary | ICD-10-CM | POA: Diagnosis not present

## 2021-03-29 DIAGNOSIS — R0902 Hypoxemia: Secondary | ICD-10-CM | POA: Diagnosis not present

## 2021-03-29 DIAGNOSIS — J449 Chronic obstructive pulmonary disease, unspecified: Secondary | ICD-10-CM | POA: Diagnosis not present

## 2021-03-29 DIAGNOSIS — E1165 Type 2 diabetes mellitus with hyperglycemia: Secondary | ICD-10-CM | POA: Diagnosis not present

## 2021-03-30 DIAGNOSIS — R0902 Hypoxemia: Secondary | ICD-10-CM | POA: Diagnosis not present

## 2021-03-30 DIAGNOSIS — J449 Chronic obstructive pulmonary disease, unspecified: Secondary | ICD-10-CM | POA: Diagnosis not present

## 2021-03-30 DIAGNOSIS — T82855D Stenosis of coronary artery stent, subsequent encounter: Secondary | ICD-10-CM | POA: Diagnosis not present

## 2021-03-30 DIAGNOSIS — I10 Essential (primary) hypertension: Secondary | ICD-10-CM | POA: Diagnosis not present

## 2021-03-30 DIAGNOSIS — I2510 Atherosclerotic heart disease of native coronary artery without angina pectoris: Secondary | ICD-10-CM | POA: Diagnosis not present

## 2021-03-30 DIAGNOSIS — E1165 Type 2 diabetes mellitus with hyperglycemia: Secondary | ICD-10-CM | POA: Diagnosis not present

## 2021-03-31 ENCOUNTER — Encounter (HOSPITAL_COMMUNITY): Payer: Self-pay

## 2021-03-31 ENCOUNTER — Telehealth (HOSPITAL_COMMUNITY): Payer: Self-pay

## 2021-03-31 DIAGNOSIS — T82855D Stenosis of coronary artery stent, subsequent encounter: Secondary | ICD-10-CM | POA: Diagnosis not present

## 2021-03-31 DIAGNOSIS — E1165 Type 2 diabetes mellitus with hyperglycemia: Secondary | ICD-10-CM | POA: Diagnosis not present

## 2021-03-31 DIAGNOSIS — I2510 Atherosclerotic heart disease of native coronary artery without angina pectoris: Secondary | ICD-10-CM | POA: Diagnosis not present

## 2021-03-31 DIAGNOSIS — I10 Essential (primary) hypertension: Secondary | ICD-10-CM | POA: Diagnosis not present

## 2021-03-31 DIAGNOSIS — R0902 Hypoxemia: Secondary | ICD-10-CM | POA: Diagnosis not present

## 2021-03-31 DIAGNOSIS — J449 Chronic obstructive pulmonary disease, unspecified: Secondary | ICD-10-CM | POA: Diagnosis not present

## 2021-03-31 NOTE — Telephone Encounter (Signed)
Attempted to call patient in regards to Cardiac Rehab - LM on VM Mailed letter 

## 2021-04-04 ENCOUNTER — Ambulatory Visit: Payer: Medicare Other | Admitting: *Deleted

## 2021-04-04 ENCOUNTER — Other Ambulatory Visit: Payer: Self-pay | Admitting: Thoracic Surgery (Cardiothoracic Vascular Surgery)

## 2021-04-04 DIAGNOSIS — Z951 Presence of aortocoronary bypass graft: Secondary | ICD-10-CM

## 2021-04-05 ENCOUNTER — Other Ambulatory Visit: Payer: Self-pay | Admitting: *Deleted

## 2021-04-05 DIAGNOSIS — E1165 Type 2 diabetes mellitus with hyperglycemia: Secondary | ICD-10-CM | POA: Diagnosis not present

## 2021-04-05 DIAGNOSIS — T82855D Stenosis of coronary artery stent, subsequent encounter: Secondary | ICD-10-CM | POA: Diagnosis not present

## 2021-04-05 DIAGNOSIS — I2510 Atherosclerotic heart disease of native coronary artery without angina pectoris: Secondary | ICD-10-CM | POA: Diagnosis not present

## 2021-04-05 DIAGNOSIS — J449 Chronic obstructive pulmonary disease, unspecified: Secondary | ICD-10-CM | POA: Diagnosis not present

## 2021-04-05 DIAGNOSIS — I10 Essential (primary) hypertension: Secondary | ICD-10-CM | POA: Diagnosis not present

## 2021-04-05 DIAGNOSIS — R0902 Hypoxemia: Secondary | ICD-10-CM | POA: Diagnosis not present

## 2021-04-05 NOTE — Patient Outreach (Signed)
[facility]) Care Management  [place]  04/05/2021   Paul [name] 21-Aug-1950 [id number]  Spoke with pt today and update on pt's ongoing management of care. Pt states he is doing well with no acute issues at this time. Reports [facility] remains involved with nursing due to his open health surgery on Oct. Diabetes continues to have good readings and pt is aware of healthy eating habits during the holidays.  Encounter Medications:  Outpatient Encounter Medications as of 04/05/2021  Medication Sig   acetaminophen (TYLENOL) 500 MG tablet Take 1,500-2,000 mg by mouth daily.   albuterol (PROVENTIL) (2.5 MG/3ML) 0.083% nebulizer solution Take 2.5 mg by nebulization every 4 (four) hours as needed for wheezing or shortness of breath.   albuterol (VENTOLIN HFA) 108 (90 Base) MCG/ACT inhaler Inhale 2 puffs into the lungs every 4 (four) hours as needed for wheezing or shortness of breath.   allopurinol (ZYLOPRIM) 100 MG tablet Take 100 mg by mouth daily.   amLODipine (NORVASC) 5 MG tablet Take 5 mg by mouth daily.   aspirin EC 81 MG EC tablet Take 1 tablet (81 mg total) by mouth daily. Swallow whole.   benzonatate (TESSALON PERLES) 100 MG capsule Take 1 capsule (100 mg total) by mouth 3 (three) times daily as needed for cough.   clopidogrel (PLAVIX) 75 MG tablet Take 1 tablet (75 mg total) by mouth daily.   gabapentin (NEURONTIN) 300 MG capsule Take 600 mg by mouth 2 (two) times daily.   insulin aspart (NOVOLOG) 100 UNIT/ML injection Inject 0-24 Units into the skin 3 (three) times daily with meals.   Insulin Glargine (BASAGLAR KWIKPEN) 100 UNIT/ML Inject 20 Units into the skin daily.   loratadine (CLARITIN) 10 MG tablet Take 1 tablet (10 mg total) by mouth daily.   menthol-cetylpyridinium (CEPACOL) 3 MG lozenge Take 1 lozenge (3 mg total) by mouth as needed for sore throat.   metFORMIN (GLUCOPHAGE) 1000 MG tablet Take 1,000 mg by mouth 2 (two) times daily.   metoprolol  tartrate (LOPRESSOR) 25 MG tablet Take 0.5 tablets (12.5 mg total) by mouth 2 (two) times daily.   nitroGLYCERIN (NITROSTAT) 0.4 MG SL tablet one tablet under tongue, q5 minutes for chest pain   polyethylene glycol (MIRALAX / GLYCOLAX) 17 g packet Take 17 g by mouth daily as needed for mild constipation.   rosuvastatin (CRESTOR) 20 MG tablet Take 1 tablet (20 mg total) by mouth daily.   tamsulosin (FLOMAX) 0.4 MG CAPS capsule Take 1 capsule (0.4 mg total) by mouth daily.   Tetrahydrozoline HCl (VISINE EXTRA OP) Place 1 drop into both eyes as needed (itching).   traMADol (ULTRAM) 50 MG tablet Take 1 tablet (50 mg total) by mouth every 6 (six) hours as needed.   umeclidinium-vilanterol (ANORO ELLIPTA) 62.5-25 MCG/INH AEPB Inhale 1 puff into the lungs daily.   Vitamin D, Ergocalciferol, (DRISDOL) 1.25 MG (50000 UNIT) CAPS capsule Take 50,000 Units by mouth once a week.   zolpidem (AMBIEN) 10 MG tablet Take 10 mg by mouth at bedtime.   No facility-administered encounter medications on file as of 04/05/2021.    Functional Status:  In your present state of health, do you have any difficulty performing the following activities: 03/07/2021 01/25/2021  Hearing? N N  Vision? N N  Difficulty concentrating or making decisions? N N  Walking or climbing stairs? N N  Dressing or bathing? N N  Doing errands, shopping? Tempie Donning  Comment Has assistance with this task -  Preparing  Food and eating ? N -  Using the Toilet? N -  In the past six months, have you accidently leaked urine? Y -  Comment Decline incontinence pads -  Do you have problems with loss of bowel control? N -  Managing your Medications? N -  Managing your Finances? N -  Housekeeping or managing your Housekeeping? N -  Some recent data might be hidden    Fall/Depression Screening: Fall Risk  03/07/2021 07/29/2018 06/04/2018  Falls in the past year? 1 1 0  Number falls in past yr: 1 1 -  Injury with Fall? 0 1 -  Comment - hit shoulder,  saw chiropractor w x ray (no injury) -  Risk for fall due to : Impaired balance/gait History of fall(s) -  Follow up Education provided;Falls prevention discussed - -   PHQ 2/9 Scores 03/07/2021 07/29/2018  PHQ - 2 Score 0 0    Assessment:   Care Plan Care Plan : [place] of Care  Updates made by Tobi Bastos, RN since 04/05/2021 12:00 AM     Problem: Knowledge Deficit related to Diabetes and Care Coordination Needs   Priority: High     Long-Range Goal: Development plan of care for management of Diabetes   Start Date: 03/07/2021  Expected End Date: 06/14/2021  This Visit's Progress: On track  Priority: High  Note:   Current Barriers:  Knowledge Deficits related to plan of care for management of DMII Chronic Disease Management support and education needs related to DMII  RNCM Clinical Goal(s):  Patient will verbalize understanding of plan for management of DMII verbalize basic understanding of  DMII disease process and self health management plan utilizing the discussed plan of care and information provided today. take all medications exactly as prescribed and will call provider for medication related questions attend all scheduled medical appointments: Continuation of monitoring blood sugars and adhering to diabetic diet . continue to work with RN Care Manager to address care management and care coordination needs related to  DMII through collaboration with RN Care manager, provider, and care team.   Interventions: Evaluation of current treatment plan related to  self management and patient's adherence to plan as established by provider  Diabetes Interventions: Assessed patient's understanding of A1c goal: <6.5% Provided education to patient about basic DM disease process; Counseled on importance of regular laboratory monitoring as prescribed; Advised patient, providing education and rationale, to check cbg TID and record, calling primary provider for findings  outside established parameters; Lab Results  Component Value Date   HGBA1C 8.8 (H) 01/21/2021  Update 12/20:Pt reports his A1C should be lower with the recent changes in he dietary habits. Pt states a recent visit with his primary provider to concurs with the expectance of a improved readings. Pt continue to manage his diabetes with baseline readings and no experience of hypo-hyperglycemia readouts. Will continue review of the above goals and interventions and encouraged adherence to all discussed.  Patient Goals/Self-Care Activities: Patient will self administer medications as prescribed Patient will attend all scheduled provider appointments Patient will call pharmacy for medication refills Patient will attend church or other social activities Patient will continue to perform ADL's independently Patient will continue to perform IADL's independently Patient will call provider office for new concerns or questions  Follow Up Plan:  Telephone follow up appointment with care management team member scheduled for:  Jan 2023       [name], RN Care Management Coordinator [place]  Main Office [phone]

## 2021-04-05 NOTE — Patient Instructions (Signed)
Visit Information  Thank you for taking time to visit with me today. Please don't hesitate to contact me if I can be of assistance to you before our next scheduled telephone appointment.    

## 2021-04-07 DIAGNOSIS — I2510 Atherosclerotic heart disease of native coronary artery without angina pectoris: Secondary | ICD-10-CM | POA: Diagnosis not present

## 2021-04-07 DIAGNOSIS — R0902 Hypoxemia: Secondary | ICD-10-CM | POA: Diagnosis not present

## 2021-04-07 DIAGNOSIS — E1165 Type 2 diabetes mellitus with hyperglycemia: Secondary | ICD-10-CM | POA: Diagnosis not present

## 2021-04-07 DIAGNOSIS — T82855D Stenosis of coronary artery stent, subsequent encounter: Secondary | ICD-10-CM | POA: Diagnosis not present

## 2021-04-07 DIAGNOSIS — I10 Essential (primary) hypertension: Secondary | ICD-10-CM | POA: Diagnosis not present

## 2021-04-07 DIAGNOSIS — J449 Chronic obstructive pulmonary disease, unspecified: Secondary | ICD-10-CM | POA: Diagnosis not present

## 2021-04-13 DIAGNOSIS — J449 Chronic obstructive pulmonary disease, unspecified: Secondary | ICD-10-CM | POA: Diagnosis not present

## 2021-04-13 DIAGNOSIS — E1165 Type 2 diabetes mellitus with hyperglycemia: Secondary | ICD-10-CM | POA: Diagnosis not present

## 2021-04-13 DIAGNOSIS — I10 Essential (primary) hypertension: Secondary | ICD-10-CM | POA: Diagnosis not present

## 2021-04-13 DIAGNOSIS — T82855D Stenosis of coronary artery stent, subsequent encounter: Secondary | ICD-10-CM | POA: Diagnosis not present

## 2021-04-13 DIAGNOSIS — I2510 Atherosclerotic heart disease of native coronary artery without angina pectoris: Secondary | ICD-10-CM | POA: Diagnosis not present

## 2021-04-13 DIAGNOSIS — R0902 Hypoxemia: Secondary | ICD-10-CM | POA: Diagnosis not present

## 2021-04-14 DIAGNOSIS — T82855D Stenosis of coronary artery stent, subsequent encounter: Secondary | ICD-10-CM | POA: Diagnosis not present

## 2021-04-14 DIAGNOSIS — I10 Essential (primary) hypertension: Secondary | ICD-10-CM | POA: Diagnosis not present

## 2021-04-14 DIAGNOSIS — I2510 Atherosclerotic heart disease of native coronary artery without angina pectoris: Secondary | ICD-10-CM | POA: Diagnosis not present

## 2021-04-14 DIAGNOSIS — J449 Chronic obstructive pulmonary disease, unspecified: Secondary | ICD-10-CM | POA: Diagnosis not present

## 2021-04-14 DIAGNOSIS — E1165 Type 2 diabetes mellitus with hyperglycemia: Secondary | ICD-10-CM | POA: Diagnosis not present

## 2021-04-14 DIAGNOSIS — R0902 Hypoxemia: Secondary | ICD-10-CM | POA: Diagnosis not present

## 2021-04-15 ENCOUNTER — Ambulatory Visit (INDEPENDENT_AMBULATORY_CARE_PROVIDER_SITE_OTHER): Payer: Self-pay | Admitting: Thoracic Surgery (Cardiothoracic Vascular Surgery)

## 2021-04-15 ENCOUNTER — Other Ambulatory Visit: Payer: Self-pay

## 2021-04-15 ENCOUNTER — Ambulatory Visit
Admission: RE | Admit: 2021-04-15 | Discharge: 2021-04-15 | Disposition: A | Discharge status: Home / Self Care | Payer: Medicare Other | Source: Ambulatory Visit | Attending: Thoracic Surgery (Cardiothoracic Vascular Surgery) | Admitting: Thoracic Surgery (Cardiothoracic Vascular Surgery)

## 2021-04-15 VITALS — BP 101/62 | HR 82 | Resp 20 | Ht 65.0 in | Wt 174.0 lb

## 2021-04-15 DIAGNOSIS — Z951 Presence of aortocoronary bypass graft: Secondary | ICD-10-CM

## 2021-04-15 DIAGNOSIS — I2510 Atherosclerotic heart disease of native coronary artery without angina pectoris: Secondary | ICD-10-CM

## 2021-04-15 DIAGNOSIS — R0602 Shortness of breath: Secondary | ICD-10-CM | POA: Diagnosis not present

## 2021-04-15 NOTE — Progress Notes (Signed)
° °   °  [address]       [place] [address]             [id number]        Aviel Scerbo [place] Medical Record #[id number] Date of Birth: [birth year]  Referring: Leonie Man, MD Primary Care: Donnajean Lopes, MD Primary Cardiologist:David Ellyn Hack, MD  Reason for visit:   follow-up  History of Present Illness:     70 year old male comes in for a 63-month.  He is status post CABG.  He continues to have ambulation difficulties.  He also complains of some chest wall tenderness.  Physical Exam: BP 101/62    Pulse 82    Resp 20    Ht 5\' 5"  (1.651 m)    Wt 174 lb (78.9 kg)    SpO2 98% Comment: RA   BMI 28.96 kg/m   Alert NAD Incision well-healed point tenderness along the lateral edge of the sternum.  Sternum stable Abdomen soft, ND No peripheral edema       Assessment / Plan:   70 year old male status post CABG.  Continues to have mobility issues.  Complains also some hip pain.  Instructed him to speak with his primary care doctor in regards obtaining a referral to orthopedic surgery.  I placed another referral for cardiac rehab.  Have also instructed him to apply warm compresses and massage therapy to his sternum.  Follow-up as needed.   Lajuana Matte 04/15/2021 2:22 PM

## 2021-04-18 DIAGNOSIS — I2510 Atherosclerotic heart disease of native coronary artery without angina pectoris: Secondary | ICD-10-CM | POA: Diagnosis not present

## 2021-04-18 DIAGNOSIS — J449 Chronic obstructive pulmonary disease, unspecified: Secondary | ICD-10-CM | POA: Diagnosis not present

## 2021-04-18 DIAGNOSIS — I10 Essential (primary) hypertension: Secondary | ICD-10-CM | POA: Diagnosis not present

## 2021-04-18 DIAGNOSIS — T82855D Stenosis of coronary artery stent, subsequent encounter: Secondary | ICD-10-CM | POA: Diagnosis not present

## 2021-04-18 DIAGNOSIS — R0902 Hypoxemia: Secondary | ICD-10-CM | POA: Diagnosis not present

## 2021-04-18 DIAGNOSIS — E1165 Type 2 diabetes mellitus with hyperglycemia: Secondary | ICD-10-CM | POA: Diagnosis not present

## 2021-04-19 DIAGNOSIS — E1165 Type 2 diabetes mellitus with hyperglycemia: Secondary | ICD-10-CM | POA: Diagnosis not present

## 2021-04-19 DIAGNOSIS — I2510 Atherosclerotic heart disease of native coronary artery without angina pectoris: Secondary | ICD-10-CM | POA: Diagnosis not present

## 2021-04-19 DIAGNOSIS — E785 Hyperlipidemia, unspecified: Secondary | ICD-10-CM | POA: Diagnosis not present

## 2021-04-19 DIAGNOSIS — Z951 Presence of aortocoronary bypass graft: Secondary | ICD-10-CM | POA: Diagnosis not present

## 2021-04-19 DIAGNOSIS — Z7902 Long term (current) use of antithrombotics/antiplatelets: Secondary | ICD-10-CM | POA: Diagnosis not present

## 2021-04-19 DIAGNOSIS — T82855D Stenosis of coronary artery stent, subsequent encounter: Secondary | ICD-10-CM | POA: Diagnosis not present

## 2021-04-19 DIAGNOSIS — Z7984 Long term (current) use of oral hypoglycemic drugs: Secondary | ICD-10-CM | POA: Diagnosis not present

## 2021-04-19 DIAGNOSIS — Z7982 Long term (current) use of aspirin: Secondary | ICD-10-CM | POA: Diagnosis not present

## 2021-04-19 DIAGNOSIS — K219 Gastro-esophageal reflux disease without esophagitis: Secondary | ICD-10-CM | POA: Diagnosis not present

## 2021-04-19 DIAGNOSIS — J449 Chronic obstructive pulmonary disease, unspecified: Secondary | ICD-10-CM | POA: Diagnosis not present

## 2021-04-19 DIAGNOSIS — R1310 Dysphagia, unspecified: Secondary | ICD-10-CM | POA: Diagnosis not present

## 2021-04-19 DIAGNOSIS — Z794 Long term (current) use of insulin: Secondary | ICD-10-CM | POA: Diagnosis not present

## 2021-04-19 DIAGNOSIS — M1990 Unspecified osteoarthritis, unspecified site: Secondary | ICD-10-CM | POA: Diagnosis not present

## 2021-04-19 DIAGNOSIS — Z9181 History of falling: Secondary | ICD-10-CM | POA: Diagnosis not present

## 2021-04-19 DIAGNOSIS — G4733 Obstructive sleep apnea (adult) (pediatric): Secondary | ICD-10-CM | POA: Diagnosis not present

## 2021-04-19 DIAGNOSIS — I10 Essential (primary) hypertension: Secondary | ICD-10-CM | POA: Diagnosis not present

## 2021-04-21 DIAGNOSIS — H524 Presbyopia: Secondary | ICD-10-CM | POA: Diagnosis not present

## 2021-04-21 DIAGNOSIS — J449 Chronic obstructive pulmonary disease, unspecified: Secondary | ICD-10-CM | POA: Diagnosis not present

## 2021-04-21 DIAGNOSIS — E785 Hyperlipidemia, unspecified: Secondary | ICD-10-CM | POA: Diagnosis not present

## 2021-04-21 DIAGNOSIS — I10 Essential (primary) hypertension: Secondary | ICD-10-CM | POA: Diagnosis not present

## 2021-04-21 DIAGNOSIS — E1165 Type 2 diabetes mellitus with hyperglycemia: Secondary | ICD-10-CM | POA: Diagnosis not present

## 2021-04-21 DIAGNOSIS — I2510 Atherosclerotic heart disease of native coronary artery without angina pectoris: Secondary | ICD-10-CM | POA: Diagnosis not present

## 2021-04-21 DIAGNOSIS — E119 Type 2 diabetes mellitus without complications: Secondary | ICD-10-CM | POA: Diagnosis not present

## 2021-04-21 DIAGNOSIS — T82855D Stenosis of coronary artery stent, subsequent encounter: Secondary | ICD-10-CM | POA: Diagnosis not present

## 2021-04-26 DIAGNOSIS — T82855D Stenosis of coronary artery stent, subsequent encounter: Secondary | ICD-10-CM | POA: Diagnosis not present

## 2021-04-26 DIAGNOSIS — E785 Hyperlipidemia, unspecified: Secondary | ICD-10-CM | POA: Diagnosis not present

## 2021-04-26 DIAGNOSIS — E1165 Type 2 diabetes mellitus with hyperglycemia: Secondary | ICD-10-CM | POA: Diagnosis not present

## 2021-04-26 DIAGNOSIS — I10 Essential (primary) hypertension: Secondary | ICD-10-CM | POA: Diagnosis not present

## 2021-04-26 DIAGNOSIS — I2510 Atherosclerotic heart disease of native coronary artery without angina pectoris: Secondary | ICD-10-CM | POA: Diagnosis not present

## 2021-04-26 DIAGNOSIS — J449 Chronic obstructive pulmonary disease, unspecified: Secondary | ICD-10-CM | POA: Diagnosis not present

## 2021-04-27 DIAGNOSIS — G8929 Other chronic pain: Secondary | ICD-10-CM | POA: Diagnosis not present

## 2021-04-27 DIAGNOSIS — E1151 Type 2 diabetes mellitus with diabetic peripheral angiopathy without gangrene: Secondary | ICD-10-CM | POA: Diagnosis not present

## 2021-04-27 DIAGNOSIS — M25551 Pain in right hip: Secondary | ICD-10-CM | POA: Diagnosis not present

## 2021-04-27 DIAGNOSIS — I129 Hypertensive chronic kidney disease with stage 1 through stage 4 chronic kidney disease, or unspecified chronic kidney disease: Secondary | ICD-10-CM | POA: Diagnosis not present

## 2021-04-27 DIAGNOSIS — G894 Chronic pain syndrome: Secondary | ICD-10-CM | POA: Diagnosis not present

## 2021-04-28 DIAGNOSIS — J449 Chronic obstructive pulmonary disease, unspecified: Secondary | ICD-10-CM | POA: Diagnosis not present

## 2021-04-28 DIAGNOSIS — E785 Hyperlipidemia, unspecified: Secondary | ICD-10-CM | POA: Diagnosis not present

## 2021-04-28 DIAGNOSIS — I10 Essential (primary) hypertension: Secondary | ICD-10-CM | POA: Diagnosis not present

## 2021-04-28 DIAGNOSIS — T82855D Stenosis of coronary artery stent, subsequent encounter: Secondary | ICD-10-CM | POA: Diagnosis not present

## 2021-04-28 DIAGNOSIS — I2510 Atherosclerotic heart disease of native coronary artery without angina pectoris: Secondary | ICD-10-CM | POA: Diagnosis not present

## 2021-04-28 DIAGNOSIS — E1165 Type 2 diabetes mellitus with hyperglycemia: Secondary | ICD-10-CM | POA: Diagnosis not present

## 2021-05-03 ENCOUNTER — Ambulatory Visit (INDEPENDENT_AMBULATORY_CARE_PROVIDER_SITE_OTHER): Payer: Medicare Other | Admitting: Orthopaedic Surgery

## 2021-05-03 ENCOUNTER — Ambulatory Visit (INDEPENDENT_AMBULATORY_CARE_PROVIDER_SITE_OTHER): Payer: Medicare Other

## 2021-05-03 ENCOUNTER — Telehealth (HOSPITAL_COMMUNITY): Payer: Self-pay

## 2021-05-03 ENCOUNTER — Encounter: Payer: Self-pay | Admitting: Orthopaedic Surgery

## 2021-05-03 DIAGNOSIS — J449 Chronic obstructive pulmonary disease, unspecified: Secondary | ICD-10-CM | POA: Diagnosis not present

## 2021-05-03 DIAGNOSIS — E785 Hyperlipidemia, unspecified: Secondary | ICD-10-CM | POA: Diagnosis not present

## 2021-05-03 DIAGNOSIS — G8929 Other chronic pain: Secondary | ICD-10-CM

## 2021-05-03 DIAGNOSIS — M545 Low back pain, unspecified: Secondary | ICD-10-CM

## 2021-05-03 DIAGNOSIS — M25512 Pain in left shoulder: Secondary | ICD-10-CM

## 2021-05-03 DIAGNOSIS — M25551 Pain in right hip: Secondary | ICD-10-CM

## 2021-05-03 DIAGNOSIS — T82855D Stenosis of coronary artery stent, subsequent encounter: Secondary | ICD-10-CM | POA: Diagnosis not present

## 2021-05-03 DIAGNOSIS — I2510 Atherosclerotic heart disease of native coronary artery without angina pectoris: Secondary | ICD-10-CM | POA: Diagnosis not present

## 2021-05-03 DIAGNOSIS — I10 Essential (primary) hypertension: Secondary | ICD-10-CM | POA: Diagnosis not present

## 2021-05-03 DIAGNOSIS — E1165 Type 2 diabetes mellitus with hyperglycemia: Secondary | ICD-10-CM | POA: Diagnosis not present

## 2021-05-03 NOTE — Progress Notes (Signed)
Office Visit Note   Patient: Paul [name]           Date of Birth: 05/13/1950           MRN: [id number] Visit Date: 05/03/2021              Requested by: Donnajean Lopes, MD [address] [place],  [place] [address] PCP: Donnajean Lopes, MD   Assessment & Plan: Visit Diagnoses:  1. Pain in right hip   2. Chronic left shoulder pain   3. Low back pain, unspecified back pain laterality, unspecified chronicity, unspecified whether sciatica present     Plan: Impression is grade 2 degenerative spondylolisthesis L5-S1.  Patient cannot have cortisone injections due to being a brittle diabetic.  He is done physical therapy as well.  At this point I feels best to make a referral to neurosurgery for surgical consultation and discussion of treatment options.  For the left shoulder he says that he has been living with this for 20 years and for now he is not ready for shoulder replacement.  We will see him back as needed.  Follow-Up Instructions: No follow-ups on file.   Orders:  Orders Placed This Encounter  Procedures   XR Lumbar Spine 2-3 Views   XR Shoulder Left   No orders of the defined types were placed in this encounter.     Procedures: No procedures performed   Clinical Data: No additional findings.   Subjective: Chief Complaint  Patient presents with   Lower Back - Pain   Left Shoulder - Pain    Paul [name] is a very pleasant 71 year old gentleman here with his caretaker referral from Dr. Boykin Reaper for chronic right hip and left shoulder pain.  He has had worsening pain in his low back and right hip for 6 months with radiation down the side of the leg.  He has tingling and numbness at times.  Denies any groin pain.  Walks with a limp and with a cane.  For the left shoulder he has had chronic pain for about 20 years.  He has had cortisone injections in the past which have shot his sugar levels up.  He feels chronic discomfort and limited range of motion.  He is  diabetic.   Review of Systems  Constitutional: Negative.   All other systems reviewed and are negative.   Objective: Vital Signs: There were no vitals taken for this visit.  Physical Exam Vitals and nursing note reviewed.  Constitutional:      Appearance: He is well-developed.  Pulmonary:     Effort: Pulmonary effort is normal.  Abdominal:     Palpations: Abdomen is soft.  Skin:    General: Skin is warm.  Neurological:     Mental Status: He is alert and oriented to person, place, and time.  Psychiatric:        Behavior: Behavior normal.        Thought Content: Thought content normal.        Judgment: Judgment normal.    Ortho Exam  Examination of the lumbar spine shows no sciatic tension signs.  Range of motion of the hip is well-preserved.  Straight leg raise against gravity elicits low back pain.  Lateral hip is nontender.  Left shoulder exam shows limited range of motion secondary to pain and grinding along the anterior joint line.  Specialty Comments:  No specialty comments available.  Imaging: XR Lumbar Spine 2-3 Views  Result Date: 05/03/2021 Grade 2  degenerative spondylolisthesis of L5-S1 with pars defect.  XR Shoulder Left  Result Date: 05/03/2021 Bone-on-bone glenohumeral joint space narrowing consistent with advanced DJD.    PMFS History: Patient Active Problem List   Diagnosis Date Noted   S/P CABG x 2 01/25/2021   Coronary artery disease 01/25/2021   Angina, class III ([place]) 12/28/2020   Primary osteoarthritis, left shoulder 07/29/2018   Prolonged QT interval    Sleep disturbance    Hypoalbuminemia due to protein-calorie malnutrition ([place])    Poorly controlled type 2 diabetes mellitus with peripheral neuropathy ([place])    Dysphagia    Pressure injury of skin 04/30/2018   Tobacco abuse    Acute blood loss anemia    Respiratory failure ([place])    CAP (community acquired pneumonia) 04/21/2018   Corneal irritation of both eyes 10/31/2017   Allergic  rhinitis due to animal hair and dander 10/31/2017   OSA and COPD overlap syndrome ([place]) 03/02/2016   Bronchitis, chronic obstructive, with exacerbation ([place]) 03/02/2016   Dependence on supplemental oxygen 03/02/2016   Gastroesophageal reflux disease 03/02/2016   HTN (hypertension), malignant 03/02/2016   Cigarette smoker 02/22/2014   DOE (dyspnea on exertion) 01/20/2014   Obesity (BMI 30.0-34.9)    Hyperlipidemia with target LDL less than 70 11/24/2011   COPD 11/24/2011   Past Medical History:  Diagnosis Date   CAD 11/24/2011   a) Mild-to-moderate 30-40% lesions in the RCA, LAD and Circumflex. b) CULPRIT LESION: long tubular 70-80% lesion in D1 with FFR of 0.7 --> PCI w/ Xience Xpedition DES 2.5 mm x 30 mm (2.65 MM); c) Lexiscan Myoview 11/2013: No Ischemia or Infarct (Inferior Gut Attenuation) EF 63%.   COPD (chronic obstructive pulmonary disease) ([place])    "don't have full case of it; I'm right there at it"   Diabetes mellitus without complication ([place])    Essential hypertension 11/24/2011   Exertional dyspnea, chronic    Gout    Hyperlipidemia with target LDL less than 70 11/24/2011   Obesity (BMI 30.0-34.9)    OSA (obstructive sleep apnea), uses oxygen at home did not tolerate cpap 11/24/2011   S/P CABG (coronary artery bypass graft)     Family History  Problem Relation Age of Onset   Heart disease Sister    Heart attack Brother    Emphysema Father        smoked   Aneurysm Father     Past Surgical History:  Procedure Laterality Date   CERVICAL SPINE SURGERY  2012   CORONARY ANGIOPLASTY WITH STENT PLACEMENT  11/23/2011   "1; first one"   CORONARY ARTERY BYPASS GRAFT N/A 01/25/2021   Procedure: CORONARY ARTERY BYPASS GRAFTING (CABG) X2, USING LEFT INTERNAL MAMMARY ARTERY AND LEFT LEG GREATER SAPHENOUS VEIN HARVESTED ENDOSCOPICALLY;  Surgeon: Lajuana Matte, MD;  Location: [place];  Service: Open Heart Surgery;  Laterality: N/A;   ENDOVEIN HARVEST OF GREATER SAPHENOUS VEIN  Bilateral 01/25/2021   Procedure: ENDOVEIN HARVEST OF GREATER SAPHENOUS VEIN;  Surgeon: Lajuana Matte, MD;  Location: [place];  Service: Open Heart Surgery;  Laterality: Bilateral;   LEFT HEART CATH AND CORONARY ANGIOGRAPHY N/A 12/28/2020   Procedure: LEFT HEART CATH AND CORONARY ANGIOGRAPHY;  Surgeon: Leonie Man, MD;  Location: [place] CV LAB;  Service: Cardiovascular;  Laterality: N/A;   LEFT HEART CATHETERIZATION WITH CORONARY ANGIOGRAM N/A 11/23/2011   Procedure: LEFT HEART CATHETERIZATION WITH CORONARY ANGIOGRAM;  Surgeon: Leonie Man, MD;  Location[facility] C[facility];  Service: Cardiovascular;  Laterality:  N/A;   TEE WITHOUT CARDIOVERSION N/A 01/25/2021   Procedure: TRANSESOPHAGEAL ECHOCARDIOGRAM (TEE);  Surgeon: Lajuana Matte, MD;  Location: [place];  Service: Open Heart Surgery;  Laterality: N/A;   Social History   Occupational History   Not on file  Tobacco Use   Smoking status: Former    Packs/day: 1.00    Years: 40.00    Pack years: 40.00    Types: Cigarettes    Quit date: 06/10/2016    Years since quitting: 4.8   Smokeless tobacco: Never   Tobacco comments:    02/18/14- smokes occ cig maybe 3 x per wk  Vaping Use   Vaping Use: Never used  Substance and Sexual Activity   Alcohol use: Yes    Alcohol/week: 0.0 standard drinks    Comment: social 2-3 drink a week   Drug use: No   Sexual activity: Not Currently

## 2021-05-03 NOTE — Telephone Encounter (Signed)
No response from pt.  Closed referral  

## 2021-05-03 NOTE — Addendum Note (Signed)
Addended by: Lendon Collar on: 05/03/2021 10:00 AM   Modules accepted: Orders

## 2021-05-04 ENCOUNTER — Other Ambulatory Visit: Payer: Self-pay | Admitting: *Deleted

## 2021-05-04 NOTE — Patient Outreach (Signed)
[facility]) Care Management  05/04/2021  Paul [name] 1950-06-04 [id number]   Telephone Assessment-Unsuccessful  RN attempted an outreach today however unsuccessful. RN able to leave a HIPAA approved voice message requesting a call back.  Will continue to follow up accordingly with ongoing care management services with the returned call. Will make another outreach call over the next week.  [name], RN Care Management Coordinator [place] Office [phone]

## 2021-05-05 DIAGNOSIS — E1165 Type 2 diabetes mellitus with hyperglycemia: Secondary | ICD-10-CM | POA: Diagnosis not present

## 2021-05-05 DIAGNOSIS — J449 Chronic obstructive pulmonary disease, unspecified: Secondary | ICD-10-CM | POA: Diagnosis not present

## 2021-05-05 DIAGNOSIS — I10 Essential (primary) hypertension: Secondary | ICD-10-CM | POA: Diagnosis not present

## 2021-05-05 DIAGNOSIS — I2510 Atherosclerotic heart disease of native coronary artery without angina pectoris: Secondary | ICD-10-CM | POA: Diagnosis not present

## 2021-05-05 DIAGNOSIS — E785 Hyperlipidemia, unspecified: Secondary | ICD-10-CM | POA: Diagnosis not present

## 2021-05-05 DIAGNOSIS — T82855D Stenosis of coronary artery stent, subsequent encounter: Secondary | ICD-10-CM | POA: Diagnosis not present

## 2021-05-05 NOTE — Patient Instructions (Signed)
Visit Information  Thank you for taking time to visit with me today. Please don't hesitate to contact me if I can be of assistance to you before our next scheduled telephone appointment.  Following are the goals we discussed today:  Patient will self administer medications as prescribed Patient will attend all scheduled provider appointments Patient will call pharmacy for medication refills Patient will attend church or other social activities Patient will continue to perform ADL's independently Patient will continue to perform IADL's independently Patient will call provider office for new concerns or questions

## 2021-05-08 IMAGING — DX DG CHEST 1V PORT
1 series · 1 of 1 positions shown · non-contrast
Comparison: March 11, 2019

CLINICAL DATA: Recent concern for pneumonia.  Fatigue.

EXAM:
PORTABLE CHEST 1 VIEW

[chest ap]
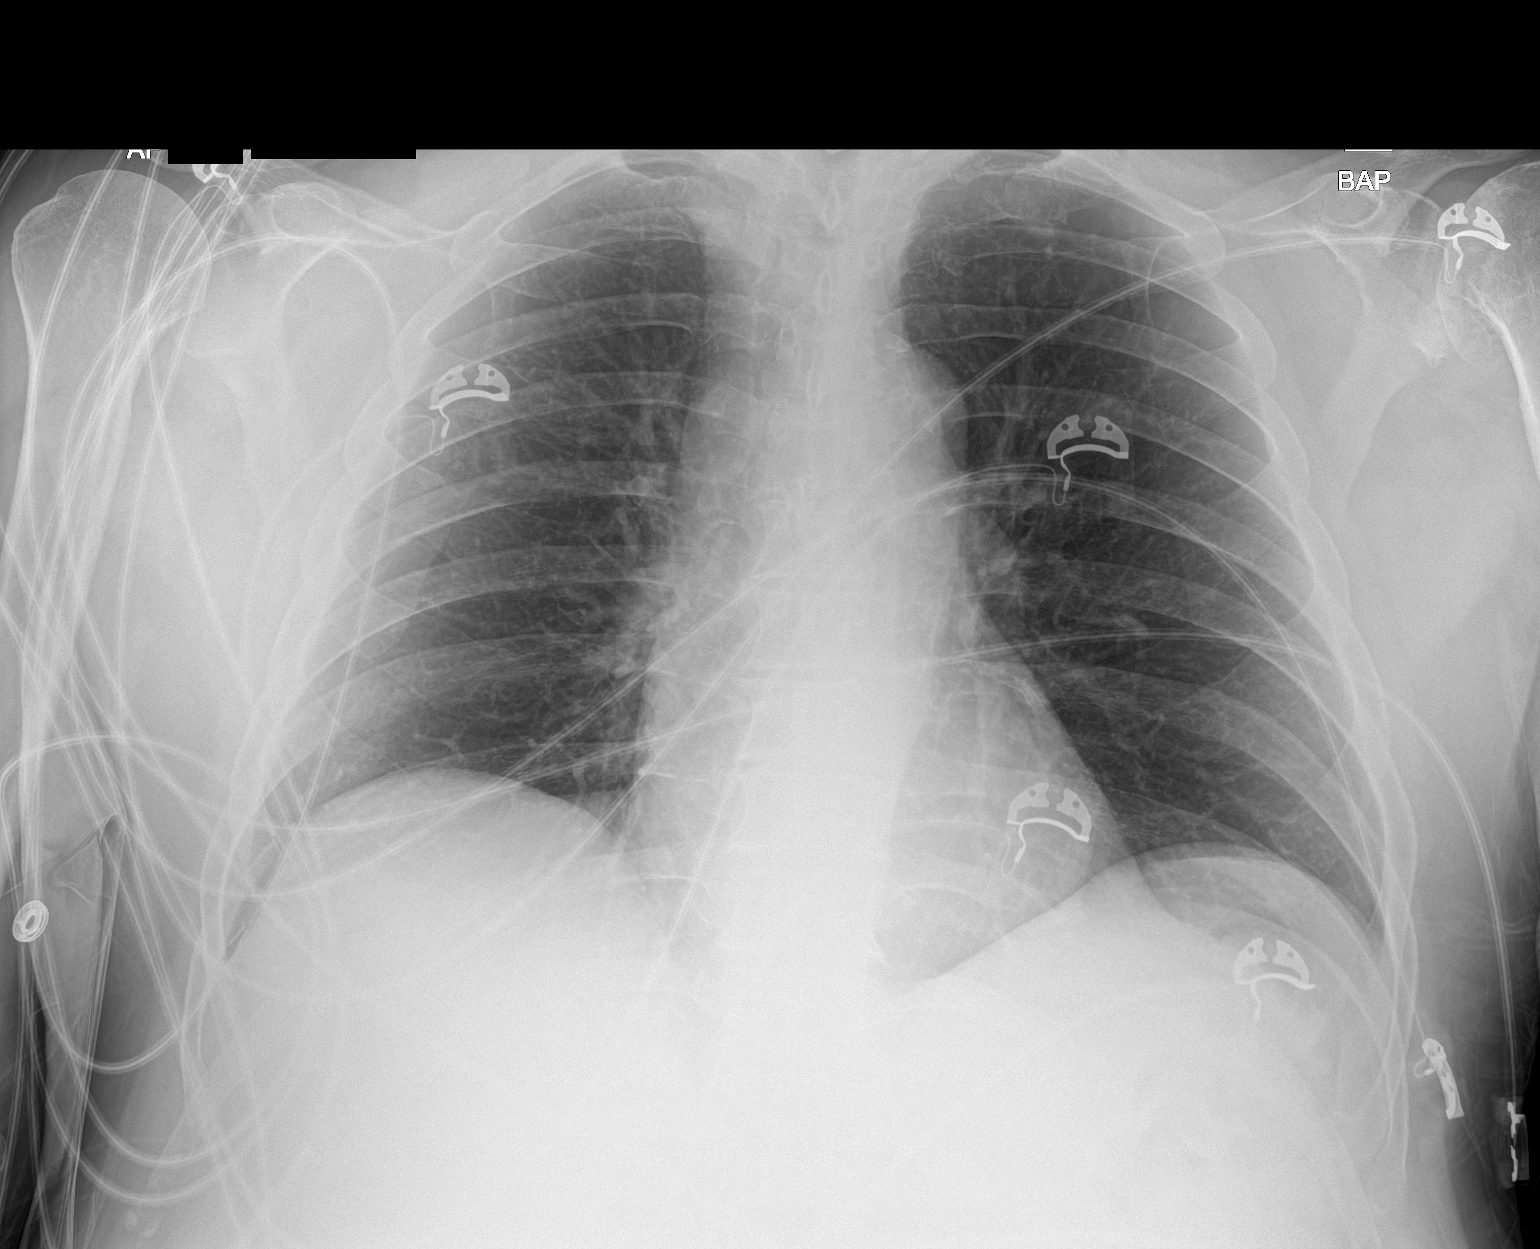

[1 of 1 positions shown; findings below may reference images not displayed]

FINDINGS: The heart size and mediastinal contours are within normal limits.
Both lungs are clear. The visualized skeletal structures are
unremarkable.
IMPRESSION: No active disease.

## 2021-05-09 DIAGNOSIS — E785 Hyperlipidemia, unspecified: Secondary | ICD-10-CM | POA: Diagnosis not present

## 2021-05-09 DIAGNOSIS — T82855D Stenosis of coronary artery stent, subsequent encounter: Secondary | ICD-10-CM | POA: Diagnosis not present

## 2021-05-09 DIAGNOSIS — I10 Essential (primary) hypertension: Secondary | ICD-10-CM | POA: Diagnosis not present

## 2021-05-09 DIAGNOSIS — I2510 Atherosclerotic heart disease of native coronary artery without angina pectoris: Secondary | ICD-10-CM | POA: Diagnosis not present

## 2021-05-09 DIAGNOSIS — E1165 Type 2 diabetes mellitus with hyperglycemia: Secondary | ICD-10-CM | POA: Diagnosis not present

## 2021-05-09 DIAGNOSIS — J449 Chronic obstructive pulmonary disease, unspecified: Secondary | ICD-10-CM | POA: Diagnosis not present

## 2021-05-11 ENCOUNTER — Ambulatory Visit: Payer: Self-pay | Admitting: *Deleted

## 2021-05-12 DIAGNOSIS — T82855D Stenosis of coronary artery stent, subsequent encounter: Secondary | ICD-10-CM | POA: Diagnosis not present

## 2021-05-12 DIAGNOSIS — I10 Essential (primary) hypertension: Secondary | ICD-10-CM | POA: Diagnosis not present

## 2021-05-12 DIAGNOSIS — E785 Hyperlipidemia, unspecified: Secondary | ICD-10-CM | POA: Diagnosis not present

## 2021-05-12 DIAGNOSIS — I2510 Atherosclerotic heart disease of native coronary artery without angina pectoris: Secondary | ICD-10-CM | POA: Diagnosis not present

## 2021-05-12 DIAGNOSIS — J449 Chronic obstructive pulmonary disease, unspecified: Secondary | ICD-10-CM | POA: Diagnosis not present

## 2021-05-12 DIAGNOSIS — E1165 Type 2 diabetes mellitus with hyperglycemia: Secondary | ICD-10-CM | POA: Diagnosis not present

## 2021-05-13 ENCOUNTER — Other Ambulatory Visit (HOSPITAL_COMMUNITY): Payer: Self-pay

## 2021-05-13 MED ORDER — LEVEMIR FLEXTOUCH 100 UNIT/ML ~~LOC~~ SOPN
40.0000 [IU] | PEN_INJECTOR | Freq: Every day | SUBCUTANEOUS | 3 refills | Status: DC
  Filled 2021-05-13: qty 18, 45d supply, fill #0
  Filled 2021-05-16 – 2021-05-17 (×3): qty 33, 82d supply, fill #0
  Filled 2021-05-18 (×2): qty 12, 30d supply, fill #0
  Filled 2021-06-16 (×2): qty 12, 30d supply, fill #1

## 2021-05-16 ENCOUNTER — Other Ambulatory Visit (HOSPITAL_COMMUNITY): Payer: Self-pay

## 2021-05-16 DIAGNOSIS — I2510 Atherosclerotic heart disease of native coronary artery without angina pectoris: Secondary | ICD-10-CM | POA: Diagnosis not present

## 2021-05-16 DIAGNOSIS — I10 Essential (primary) hypertension: Secondary | ICD-10-CM | POA: Diagnosis not present

## 2021-05-16 DIAGNOSIS — E785 Hyperlipidemia, unspecified: Secondary | ICD-10-CM | POA: Diagnosis not present

## 2021-05-16 DIAGNOSIS — J449 Chronic obstructive pulmonary disease, unspecified: Secondary | ICD-10-CM | POA: Diagnosis not present

## 2021-05-16 DIAGNOSIS — T82855D Stenosis of coronary artery stent, subsequent encounter: Secondary | ICD-10-CM | POA: Diagnosis not present

## 2021-05-16 DIAGNOSIS — E1165 Type 2 diabetes mellitus with hyperglycemia: Secondary | ICD-10-CM | POA: Diagnosis not present

## 2021-05-17 ENCOUNTER — Other Ambulatory Visit (HOSPITAL_COMMUNITY): Payer: Self-pay

## 2021-05-17 MED ORDER — ALLOPURINOL 100 MG PO TABS
100.0000 mg | ORAL_TABLET | Freq: Every day | ORAL | 2 refills | Status: DC
Start: 2021-05-17 — End: 2021-08-10
  Filled 2021-05-17 – 2021-07-11 (×2): qty 30, 30d supply, fill #0

## 2021-05-17 MED ORDER — METFORMIN HCL ER 500 MG PO TB24
1000.0000 mg | ORAL_TABLET | Freq: Two times a day (BID) | ORAL | 2 refills | Status: DC
  Filled 2021-05-17: qty 120, 30d supply, fill #0

## 2021-05-17 MED ORDER — INSULIN LISPRO (1 UNIT DIAL) 100 UNIT/ML (KWIKPEN)
PEN_INJECTOR | SUBCUTANEOUS | 2 refills | Status: DC
  Filled 2021-05-17 (×2): qty 6, 28d supply, fill #0

## 2021-05-17 MED ORDER — VITAMIN D (ERGOCALCIFEROL) 1.25 MG (50000 UNIT) PO CAPS
50000.0000 [IU] | ORAL_CAPSULE | ORAL | 3 refills | Status: DC
  Filled 2021-05-17: qty 4, 28d supply, fill #0

## 2021-05-17 MED ORDER — AMLODIPINE BESYLATE 5 MG PO TABS
5.0000 mg | ORAL_TABLET | Freq: Every day | ORAL | 2 refills | Status: DC
Start: 2021-05-17 — End: 2021-10-28
  Filled 2021-05-17 – 2021-07-05 (×2): qty 30, 30d supply, fill #0
  Filled 2021-08-01: qty 30, 30d supply, fill #1

## 2021-05-17 MED ORDER — PANTOPRAZOLE SODIUM 40 MG PO TBEC
40.0000 mg | DELAYED_RELEASE_TABLET | Freq: Every day | ORAL | 2 refills | Status: DC
  Filled 2021-05-17 – 2021-07-11 (×2): qty 30, 30d supply, fill #0

## 2021-05-17 MED ORDER — METOPROLOL TARTRATE 25 MG PO TABS
25.0000 mg | ORAL_TABLET | Freq: Two times a day (BID) | ORAL | 2 refills | Status: DC
  Filled 2021-05-17: qty 60, 30d supply, fill #0

## 2021-05-17 MED ORDER — ROSUVASTATIN CALCIUM 20 MG PO TABS
20.0000 mg | ORAL_TABLET | Freq: Every day | ORAL | 2 refills | Status: DC
  Filled 2021-05-17: qty 30, 30d supply, fill #0

## 2021-05-18 ENCOUNTER — Other Ambulatory Visit (HOSPITAL_COMMUNITY): Payer: Self-pay

## 2021-05-19 ENCOUNTER — Encounter (HOSPITAL_BASED_OUTPATIENT_CLINIC_OR_DEPARTMENT_OTHER): Payer: Self-pay | Admitting: Family

## 2021-05-19 ENCOUNTER — Ambulatory Visit (INDEPENDENT_AMBULATORY_CARE_PROVIDER_SITE_OTHER): Payer: Medicare Other | Admitting: Family

## 2021-05-19 ENCOUNTER — Other Ambulatory Visit (HOSPITAL_COMMUNITY): Payer: Self-pay

## 2021-05-19 ENCOUNTER — Other Ambulatory Visit: Payer: Self-pay

## 2021-05-19 VITALS — BP 120/64 | HR 96 | Ht 65.0 in | Wt 186.3 lb

## 2021-05-19 DIAGNOSIS — M1990 Unspecified osteoarthritis, unspecified site: Secondary | ICD-10-CM | POA: Diagnosis not present

## 2021-05-19 DIAGNOSIS — I25118 Atherosclerotic heart disease of native coronary artery with other forms of angina pectoris: Secondary | ICD-10-CM | POA: Diagnosis not present

## 2021-05-19 DIAGNOSIS — I2510 Atherosclerotic heart disease of native coronary artery without angina pectoris: Secondary | ICD-10-CM | POA: Diagnosis not present

## 2021-05-19 DIAGNOSIS — T82855D Stenosis of coronary artery stent, subsequent encounter: Secondary | ICD-10-CM | POA: Diagnosis not present

## 2021-05-19 DIAGNOSIS — G4733 Obstructive sleep apnea (adult) (pediatric): Secondary | ICD-10-CM | POA: Diagnosis not present

## 2021-05-19 DIAGNOSIS — E785 Hyperlipidemia, unspecified: Secondary | ICD-10-CM

## 2021-05-19 DIAGNOSIS — E1165 Type 2 diabetes mellitus with hyperglycemia: Secondary | ICD-10-CM | POA: Diagnosis not present

## 2021-05-19 DIAGNOSIS — Z951 Presence of aortocoronary bypass graft: Secondary | ICD-10-CM | POA: Diagnosis not present

## 2021-05-19 DIAGNOSIS — Z9181 History of falling: Secondary | ICD-10-CM | POA: Diagnosis not present

## 2021-05-19 DIAGNOSIS — K219 Gastro-esophageal reflux disease without esophagitis: Secondary | ICD-10-CM | POA: Diagnosis not present

## 2021-05-19 DIAGNOSIS — J449 Chronic obstructive pulmonary disease, unspecified: Secondary | ICD-10-CM | POA: Diagnosis not present

## 2021-05-19 DIAGNOSIS — Z7984 Long term (current) use of oral hypoglycemic drugs: Secondary | ICD-10-CM | POA: Diagnosis not present

## 2021-05-19 DIAGNOSIS — R1310 Dysphagia, unspecified: Secondary | ICD-10-CM | POA: Diagnosis not present

## 2021-05-19 DIAGNOSIS — Z794 Long term (current) use of insulin: Secondary | ICD-10-CM | POA: Diagnosis not present

## 2021-05-19 DIAGNOSIS — Z7902 Long term (current) use of antithrombotics/antiplatelets: Secondary | ICD-10-CM | POA: Diagnosis not present

## 2021-05-19 DIAGNOSIS — I10 Essential (primary) hypertension: Secondary | ICD-10-CM | POA: Diagnosis not present

## 2021-05-19 DIAGNOSIS — Z7982 Long term (current) use of aspirin: Secondary | ICD-10-CM | POA: Diagnosis not present

## 2021-05-19 MED ORDER — METOPROLOL TARTRATE 25 MG PO TABS
25.0000 mg | ORAL_TABLET | Freq: Two times a day (BID) | ORAL | 2 refills | Status: DC
  Filled 2021-07-05: qty 60, 30d supply, fill #0

## 2021-05-19 MED ORDER — ROSUVASTATIN CALCIUM 20 MG PO TABS
20.0000 mg | ORAL_TABLET | Freq: Every day | ORAL | 2 refills | Status: DC
  Filled 2021-05-19: qty 30, 30d supply, fill #0

## 2021-05-19 NOTE — Progress Notes (Signed)
Office Visit    Patient Name: Paul [name] Date of Encounter: 05/19/2021  PCP:  Donnajean Lopes, [facility]  Cardiologist:  Glenetta Hew, MD  Advanced Practice Provider:  No care team member to display Electrophysiologist:  None      Chief Complaint    Paul [name] is a 71 y.o. male with a hx of CAD s/p PCI and subseqent CABG 01/25/21 [facility] LAD (free graft, jumped off the vein graft proximally), RSVG OM], DM2, HLD presents today for follow-up  Past Medical History    Past Medical History:  Diagnosis Date   CAD 11/24/2011   a) Mild-to-moderate 30-40% lesions in the RCA, LAD and Circumflex. b) CULPRIT LESION: long tubular 70-80% lesion in D1 with FFR of 0.7 --> PCI w/ Xience Xpedition DES 2.5 mm x 30 mm (2.65 MM); c) Lexiscan Myoview 11/2013: No Ischemia or Infarct (Inferior Gut Attenuation) EF 63%.   COPD (chronic obstructive pulmonary disease) ([place])    "don't have full case of it; I'm right there at it"   Diabetes mellitus without complication ([place])    Essential hypertension 11/24/2011   Exertional dyspnea, chronic    Gout    Hyperlipidemia with target LDL less than 70 11/24/2011   Obesity (BMI 30.0-34.9)    OSA (obstructive sleep apnea), uses oxygen at home did not tolerate cpap 11/24/2011   S/P CABG (coronary artery bypass graft)    Past Surgical History:  Procedure Laterality Date   CERVICAL SPINE SURGERY  2012   CORONARY ANGIOPLASTY WITH STENT PLACEMENT  11/23/2011   "1; first one"   CORONARY ARTERY BYPASS GRAFT N/A 01/25/2021   Procedure: CORONARY ARTERY BYPASS GRAFTING (CABG) X2, USING LEFT INTERNAL MAMMARY ARTERY AND LEFT LEG GREATER SAPHENOUS VEIN HARVESTED ENDOSCOPICALLY;  Surgeon: Lajuana Matte, MD;  Location: [place];  Service: Open Heart Surgery;  Laterality: N/A;   ENDOVEIN HARVEST OF GREATER SAPHENOUS VEIN Bilateral 01/25/2021   Procedure: ENDOVEIN HARVEST OF GREATER SAPHENOUS VEIN;  Surgeon: Lajuana Matte, MD;   Location: [place];  Service: Open Heart Surgery;  Laterality: Bilateral;   LEFT HEART CATH AND CORONARY ANGIOGRAPHY N/A 12/28/2020   Procedure: LEFT HEART CATH AND CORONARY ANGIOGRAPHY;  Surgeon: Leonie Man, MD;  Location: [place] CV LAB;  Service: Cardiovascular;  Laterality: N/A;   LEFT HEART CATHETERIZATION WITH CORONARY ANGIOGRAM N/A 11/23/2011   Procedure: LEFT HEART CATHETERIZATION WITH CORONARY ANGIOGRAM;  Surgeon: Leonie Man, MD;  Location: [facility] CATH LAB;  Service: Cardiovascular;  Laterality: N/A;   TEE WITHOUT CARDIOVERSION N/A 01/25/2021   Procedure: TRANSESOPHAGEAL ECHOCARDIOGRAM (TEE);  Surgeon: Lajuana Matte, MD;  Location: [place];  Service: Open Heart Surgery;  Laterality: N/A;    Allergies  Allergies  Allergen Reactions   Morphine And Related Itching    Possible itching due to morphine 01/26/21   Other Other (See Comments)    Cats- eyes burn    History of Present Illness    Paul [name] is a 71 y.o. male with a hx of CAD s/p PCI and subseqent CABG 01/25/21 [[facility] LAD (free graft, jumped off the vein graft proximally), RSVG OM], DM2, HLD last seen 03/18/21  He was seen 12/13/2020 by Dr. Martinique noting increasing precordial pain and exertional dyspnea.  Cardiac catheterization 12/28/20 showed extensive LAD and circumflex disease with occluded diagonal recommended for cardiothoracic surgery. Evaluated in the outpatient setting by Dr. Kipp Brood and subsequently admitted 01/25/2021 for CABG X2.  He was volume overloaded  and diuresed.  He was started on Flomax due to difficulty voiding.  01/30/21 he did have an episode where he fell off a chair after coughing episode with small laceration to nose but otherwise neurologically intact.  Head CT negative for intracranial injury.  He was discharged to SNF.  Seen in follow up 03/18/22 with his friend and caretaker Helene Kelp (she is his ex son in [place] new wife and she and her husband have been helping care for him since surgery). He  had viral illness and supportive care, [place] recommended. Still with incisional soreness.  Presents today for follow up. Has been seen by ortho and pending neurosurgeon appointment due to back pain, shoulder pain. Working with PT at home. Notes some "knots" under his chest wall which are sore and likely keloid scarring. Reports no shortness of breath nor dyspnea on exertion. Reports no chest pain, pressure, or tightness. No edema, orthopnea, PND. Reports no palpitations.  BP at home well controlled. Gradually increasing his activity.  EKGs/Labs/Other Studies Reviewed:   The following studies were reviewed today:  LHC 12/28/2020  Conclusion      Prox LAD to Mid LAD lesion is 65% stenosed with 30% stenosed side branch in 1st Diag. (RFR 0.90-0.91)   1st Diag lesion is 100% stenosed. (In-stent Re-stenosis/occlusion)   Mid LAD lesion is 40% stenosed.   Mid LAD to Dist LAD lesion is 75% stenosed. (RFR 0.74)   Ost Cx to Mid Cx lesion is 55% stenosed with 20% stenosed side branch in 1st Mrg.   Mid Cx lesion is 90% stenosed.   Prox RCA lesion is 40% stenosed.   -------------------------------------------   The left ventricular systolic function is normal.  The left ventricular ejection fraction is 50-55% by visual estimate.   LV end diastolic pressure is normal.   There is no aortic valve stenosis.    SUMMARY Severe 3 Vessel CAD:  100% ISR of Major D1 stent,  focal 65% prox LAD (@ D1) lesion - FFR 0.91 followed by 40% LAD @ SP1 then long 70% mid LAD (FFR 0.74 - SIGNIFICANT).  prox-mid LCx diffuse 55% with focal 90% just after AVG branch in LCx-Lateral OM2. Moderate Prox-mid RCA 35-40% @ bend  Poor LV Gram image, but appears to have preserved LVEF with normal LVEDP (2 D Echo ordered).      RECOMMENDATIONS With extensive LAD and LCx disease and occluded diagonal, best treatment option would likely be CVTS.  He has been on Plavix since his PCI. CVTS consult placed, 2D echo ordered, and  Plavix discontinued. Anticipate outpatient CVTS clinic visit with staged CABG after Plavix washout -> otherwise would need to consider extensive PCI of the LAD with focal PCI of LCx and attempted recanalization of occluded D1 stent Will need to continue aggressive risk factor modification/Guideline Directed Medical Management  Echo 12/31/2020 1. Left ventricular ejection fraction, by estimation, is 60 to 65%. The  left ventricle has normal function. The left ventricle has no regional  wall motion abnormalities. Left ventricular diastolic parameters are  consistent with Grade I diastolic  dysfunction (impaired relaxation).   2. Right ventricular systolic function is normal. The right ventricular  size is normal.   3. The mitral valve is normal in structure. No evidence of mitral valve  regurgitation. No evidence of mitral stenosis.   4. The aortic valve is normal in structure. Aortic valve regurgitation is  not visualized. No aortic stenosis is present.   5. The inferior vena cava is normal in size  with greater than 50%  respiratory variability, suggesting right atrial pressure of 3 mmHg.   Doppler 01/21/2021  Summary:  Right Carotid: Velocities in the right ICA are consistent with a 1-39%  stenosis.   Left Carotid: Velocities in the left ICA are consistent with a 1-39%  stenosis.  Vertebrals: Bilateral vertebral arteries demonstrate antegrade flow.   Right ABI: Resting right ankle-brachial index is within normal range. No  evidence of significant right lower extremity arterial disease.  Left ABI: Resting left ankle-brachial index is within normal range. No  evidence of significant left lower extremity arterial disease.  Right Upper Extremity: Doppler waveforms decrease >50% with right radial  compression. Doppler waveforms remain within normal limits with right  ulnar compression.  Left Upper Extremity: Doppler waveform obliterate with left radial  compression. Doppler waveforms  remain within normal limits with left ulnar  compression.    EKG:  EKG is  ordered today.  The ekg ordered today demonstrates NSR 71 bpm with no acute ST/T wave changes   Recent Labs: 07/14/2020: TSH 2.852 01/21/2021: ALT 26 01/26/2021: Magnesium 2.1 03/21/2021: BUN 14; Creatinine, Ser 0.89; Hemoglobin 10.7; Platelets 166; Potassium 4.7; Sodium 140  Recent Lipid Panel    Component Value Date/Time   CHOL 119 12/21/2020 0824   TRIG 201 (H) 12/21/2020 0824   HDL 24 (L) 12/21/2020 0824   CHOLHDL 5.0 12/21/2020 0824   CHOLHDL 6.6 11/23/2011 0846   VLDL 29 11/23/2011 0846   LDLCALC 61 12/21/2020 0824    Home Medications   Current Meds  Medication Sig   acetaminophen (TYLENOL) 500 MG tablet Take 1,500-2,000 mg by mouth daily.   albuterol (PROVENTIL) (2.5 MG/3ML) 0.083% nebulizer solution Take 2.5 mg by nebulization every 4 (four) hours as needed for wheezing or shortness of breath.   albuterol (VENTOLIN HFA) 108 (90 Base) MCG/ACT inhaler Inhale 2 puffs into the lungs every 4 (four) hours as needed for wheezing or shortness of breath.   amLODipine (NORVASC) 5 MG tablet Take 5 mg by mouth daily.   amLODipine (NORVASC) 5 MG tablet Take 1 tablet (5 mg total) by mouth daily.   amLODipine (NORVASC) 5 MG tablet Take 1 tablet by mouth daily.   aspirin EC 81 MG EC tablet Take 1 tablet (81 mg total) by mouth daily. Swallow whole.   benzonatate (TESSALON PERLES) 100 MG capsule Take 1 capsule (100 mg total) by mouth 3 (three) times daily as needed for cough.   clopidogrel (PLAVIX) 75 MG tablet Take 1 tablet (75 mg total) by mouth daily.   gabapentin (NEURONTIN) 300 MG capsule Take 600 mg by mouth 2 (two) times daily.   insulin detemir (LEVEMIR FLEXTOUCH) 100 UNIT/ML FlexPen Inject 40 Units into the skin daily.   insulin lispro (HUMALOG) 100 UNIT/ML KwikPen Inject 10 Units into the skin every morning AND 6 Units daily with lunch AND 6 Units daily with supper. (Patient taking differently: Inject 14Units  into the skin every morning AND 14 Units daily with lunch AND 14Units daily with supper.)   menthol-cetylpyridinium (CEPACOL) 3 MG lozenge Take 1 lozenge (3 mg total) by mouth as needed for sore throat.   metFORMIN (GLUCOPHAGE-XR) 500 MG 24 hr tablet Take 2 tablets (1,000 mg total) by mouth 2 (two) times daily for diabetes.   metoprolol tartrate (LOPRESSOR) 25 MG tablet Take 0.5 tablets (12.5 mg total) by mouth 2 (two) times daily.   metoprolol tartrate (LOPRESSOR) 25 MG tablet Take 1 tablet (25 mg total) by mouth 2 (two) times  daily.   nitroGLYCERIN (NITROSTAT) 0.4 MG SL tablet one tablet under tongue, q5 minutes for chest pain   polyethylene glycol (MIRALAX / GLYCOLAX) 17 g packet Take 17 g by mouth daily as needed for mild constipation.   Tetrahydrozoline HCl (VISINE EXTRA OP) Place 1 drop into both eyes as needed (itching).   traMADol (ULTRAM) 50 MG tablet Take 1 tablet (50 mg total) by mouth every 6 (six) hours as needed.   umeclidinium-vilanterol (ANORO ELLIPTA) 62.5-25 MCG/INH AEPB Inhale 1 puff into the lungs daily.   Vitamin D, Ergocalciferol, (DRISDOL) 1.25 MG (50000 UNIT) CAPS capsule Take 50,000 Units by mouth once a week.   zolpidem (AMBIEN) 10 MG tablet Take 10 mg by mouth at bedtime.     Review of Systems      All other systems reviewed and are otherwise negative except as noted above.  Physical Exam    VS:  BP 120/64    Pulse 96    Ht 5\' 5"  (1.651 m)    Wt 186 lb 4.8 oz (84.5 kg)    SpO2 99%    BMI 31.00 kg/m  , BMI Body mass index is 31 kg/m.  Wt Readings from Last 3 Encounters:  05/19/21 186 lb 4.8 oz (84.5 kg)  04/15/21 174 lb (78.9 kg)  03/18/21 174 lb 3.2 oz (79 kg)     GEN: Well nourished, well developed, in no acute distress. HEENT: normal. Neck: Supple, no JVD, carotid bruits, or masses. Cardiac: RRR, no murmurs, rubs, or gallops. No clubbing, cyanosis, edema.  Radials/PT 2+ and equal bilaterally.  Respiratory:  Respirations regular and unlabored, clear to  auscultation bilaterally. GI: Soft, nontender, nondistended. MS: No deformity or atrophy. Skin: Warm and dry, no rash. Midsternal incision clean, dry, intact and healed with no scab. Chest wall tender on palpation bilaterally.  Neuro:  Strength and sensation are intact. Psych: Normal affect.  Assessment & Plan    CAD s/p PCI and subsequent CABG (LIMA-LAD, RSVG-OM) - Stable with no anginal symptoms. No indication for ischemic evaluation.  GDMT includes aspirin, metorpolol, plavix, rosuvastatin. Heart healthy diet and regular cardiovascular exercise encouraged.    HLD, LDL goal <70 - 12/2020 LDL 61. Continue Rosuvastatin 20mg  QD. Denies myalgias.  DM2 - Continue to follow with PCP.   Carotid artery stenosis - 01/21/21 duplex bilateral 1-39% stenosis. Continue aspirin, statin. No amaurosis fugax.   HTN - BP well controlled. Continue current antihypertensive regimen.    Disposition: Follow up in 6 month(s) with Glenetta Hew, MD or APP.  Signed, Loel Dubonnet, NP 05/19/2021, 3:54 PM [place]

## 2021-05-19 NOTE — Patient Instructions (Addendum)
Medication Instructions:  Continue your current medications.   Make sure you are taking your Rosuvastatin (Crestor) for cholesterol.   *If you need a refill on your cardiac medications before your next appointment, please call your pharmacy*   Lab Work: None ordered today.    Testing/Procedures: None ordered today.   Follow-Up: At [facility], you and your health needs are our priority.  As part of our continuing mission to provide you with exceptional heart care, we have created designated Provider Care Teams.  These Care Teams include your primary Cardiologist (physician) and Advanced Practice Providers (APPs -  Physician Assistants and Nurse Practitioners) who all work together to provide you with the care you need, when you need it.  We recommend signing up for the patient portal called "MyChart".  Sign up information is provided on this After Visit Summary.  MyChart is used to connect with patients for Virtual Visits (Telemedicine).  Patients are able to view lab/test results, encounter notes, upcoming appointments, etc.  Non-urgent messages can be sent to your provider as well.   To learn more about what you can do with MyChart, go to [email or web address].    Your next appointment:   6 month(s)  The format for your next appointment:   In Person  Provider:   Glenetta Hew, MD    Other Instructions  Your orthopedist has referred you to neurosurgery at [facility]. Their phone number is [phone] if you want to check on the status of your referral.   Heart Healthy Diet Recommendations: A low-salt diet is recommended. Meats should be grilled, baked, or boiled. Avoid fried foods. Focus on lean protein sources like fish or chicken with vegetables and fruits. The American Heart Association is a Microbiologist!  American Heart Association Diet and Lifeystyle Recommendations    Exercise recommendations: The American Heart Association recommends 150  minutes of moderate intensity exercise weekly. Try 30 minutes of moderate intensity exercise 4-5 times per week. This could include walking, jogging, or swimming.

## 2021-05-20 ENCOUNTER — Other Ambulatory Visit: Payer: Self-pay | Admitting: *Deleted

## 2021-05-20 NOTE — Patient Outreach (Signed)
Paul [name]) Care Management  05/20/2021  Paul [name] 10/23/1950 [id number]   Telephone Assessment-Unsuccessful  RN attempted outreach call today however unsuccessful. RN able to leave a HIPAA approved voice message requesting a call back.  Will outreach once again over the next week for pending services.  Paul Mina, RN Care Management Coordinator [place] Office [phone]

## 2021-05-20 NOTE — Patient Outreach (Signed)
[facility]) Care Management Telephonic RN Care Manager Note   05/04/2021-Late Entry Name:  Paul [name] MRN:  [id number] DOB:  1951-04-03   Recommendations/Changes made from today's visit: Updates documented within the plan of care  Subjective: Paul [name] is an 71 y.o. year old male who is a primary patient of Donnajean Lopes, MD. The care management team was consulted for assistance with care management and/or care coordination needs.    Telephonic RN Care Manager completed Telephone Visit today.  Objective:   Medications Reviewed Today     Reviewed by Precious Bard, [place] (Registered Medical Assistant) on 05/03/21 at [place] List Status: <None>   Medication Order Taking? Sig Documenting Provider Last Dose Status Informant  acetaminophen (TYLENOL) 500 MG tablet [id number] No Take 1,500-2,000 mg by mouth daily. [provider] Taking Active Self  albuterol (PROVENTIL) (2.5 MG/3ML) 0.083% nebulizer solution [id number] No Take 2.5 mg by nebulization every 4 (four) hours as needed for wheezing or shortness of breath. [provider] Taking Active Self  albuterol (VENTOLIN HFA) 108 (90 Base) MCG/ACT inhaler [id number] No Inhale 2 puffs into the lungs every 4 (four) hours as needed for wheezing or shortness of breath. [provider] Taking Active Self  allopurinol (ZYLOPRIM) 100 MG tablet [id number] No Take 100 mg by mouth daily. [provider] Taking Active Self  amLODipine (NORVASC) 5 MG tablet [id number] No Take 5 mg by mouth daily. Donnajean Lopes, MD Taking Active   aspirin EC 81 MG EC tablet [id number] No Take 1 tablet (81 mg total) by mouth daily. Swallow whole. Elgie Collard, PA-C Taking Active   benzonatate (TESSALON PERLES) 100 MG capsule [id number] No Take 1 capsule (100 mg total) by mouth 3 (three) times daily as needed for cough. Loel Dubonnet, NP Taking Active   clopidogrel (PLAVIX) 75 MG tablet [id number] No Take 1  tablet (75 mg total) by mouth daily. Cathlyn Parsons, PA-C Taking Active Self  gabapentin (NEURONTIN) 300 MG capsule [id number] No Take 600 mg by mouth 2 (two) times daily. [provider] Taking Active Self  insulin aspart (NOVOLOG) 100 UNIT/ML injection [id number] No Inject 0-24 Units into the skin 3 (three) times daily with meals. Elgie Collard, [place] Taking Active   Insulin Glargine (BASAGLAR KWIKPEN) 100 UNIT/ML [id number] No Inject 20 Units into the skin daily. Elgie Collard, PA-C Taking Active   loratadine (CLARITIN) 10 MG tablet [id number] No Take 1 tablet (10 mg total) by mouth daily. Cathlyn Parsons, PA-C Taking Active Self  menthol-cetylpyridinium (CEPACOL) 3 MG lozenge [id number] No Take 1 lozenge (3 mg total) by mouth as needed for sore throat. Elgie Collard, PA-C Taking Active   metFORMIN (GLUCOPHAGE) 1000 MG tablet [id number] No Take 1,000 mg by mouth 2 (two) times daily. [provider] Taking Active   metoprolol tartrate (LOPRESSOR) 25 MG tablet [id number] No Take 0.5 tablets (12.5 mg total) by mouth 2 (two) times daily. Loel Dubonnet, NP Taking Active   nitroGLYCERIN (NITROSTAT) 0.4 MG SL tablet [id number] No one tablet under tongue, q5 minutes for chest pain [provider] Taking Active   polyethylene glycol (MIRALAX / GLYCOLAX) 17 g packet [id number] No Take 17 g by mouth daily as needed for mild constipation. [provider] Taking Active Self  rosuvastatin (CRESTOR) 20 MG tablet [id number] No Take 1 tablet (20 mg total) by mouth daily. Martinique, [name], MD Taking Active Self  tamsulosin [facility]) 0.4 MG CAPS capsule [id number]  No Take 1 capsule (0.4 mg total) by mouth daily. Elgie Collard, PA-C Taking Active   Tetrahydrozoline HCl Karma [facility] [place] [facility]) [id number] No Place 1 drop into both eyes as needed (itching). [provider] Taking Active Self  traMADol (ULTRAM) 50 MG tablet [id number] No Take 1 tablet (50 mg total) by mouth every 6  (six) hours as needed. Lars Pinks M, PA-C Taking Active   umeclidinium-vilanterol [facility] ELLIPTA) 62.5-25 MCG/INH AEPB [id number] No Inhale 1 puff into the lungs daily. Lauraine Rinne, NP Taking Active Self  Vitamin D, Ergocalciferol, (DRISDOL) 1.25 MG (50000 UNIT) CAPS capsule [id number] No Take 50,000 Units by mouth once a week. [provider] Taking Active Self  zolpidem (AMBIEN) 10 MG tablet [id number] No Take 10 mg by mouth at bedtime. [provider] Taking Active Self             SDOH:  (Social Determinants of Health) assessments and interventions performed:     Care Plan  Review of patient past medical history, allergies, medications, health status, including review of consultants reports, laboratory and other test data, was performed as part of comprehensive evaluation for care management services.   Care Plan : RN Care Manager Plan of Care  Updates made by Tobi Bastos, RN since 05/20/2021 12:00 AM     Problem: Knowledge Deficit related to Diabetes and Care Coordination Needs   Priority: High     Long-Range Goal: Development plan of care for management of Diabetes   Start Date: 03/07/2021  Expected End Date: 06/14/2021  Recent Progress: On track  Priority: High  Note:   Current Barriers:  Knowledge Deficits related to plan of care for management of DMII Chronic Disease Management support and education needs related to DMII  RNCM Clinical Goal(s):  Patient will verbalize understanding of plan for management of DMII verbalize basic understanding of  DMII disease process and self health management plan utilizing the discussed plan of care and information provided today. take all medications exactly as prescribed and will call provider for medication related questions attend all scheduled medical appointments: Continuation of monitoring blood sugars and adhering to diabetic diet . continue to work with RN Care Manager to address care  management and care coordination needs related to  DMII through collaboration with RN Care manager, provider, and care team.   Interventions: Evaluation of current treatment plan related to  self management and patient's adherence to plan as established by provider  Diabetes Interventions: Assessed patient's understanding of A1c goal: <6.5% Provided education to patient about basic DM disease process; Counseled on importance of regular laboratory monitoring as prescribed; Advised patient, providing education and rationale, to check cbg TID and record, calling primary provider for findings outside established parameters; Lab Results  Component Value Date   HGBA1C 8.8 (H) 01/21/2021  Update 12/20:Pt reports his A1C should be lower with the recent changes in he dietary habits. Pt states a recent visit with his primary provider to concurs with the expectance of a improved readings. Pt continue to manage his diabetes with baseline readings and no experience of hypo-hyperglycemia readouts. Will continue review of the above goals and interventions and encouraged adherence to all discussed. 1/18- Pt reports issues with one of his medications Levemir. States pharmacy indicates back order and last dose was from his provider's office. Pt only has 4 dose/days left and needing a refill however having difficulty getting through to his provider. RN offered to intervene and was able to call and leave Dr.  Paterson's nurse a voice message. Will follow up over the next few weeks concerning any interventions provided by the provider's office. Pt aware to call RN back if no call back from the provider's office over the next few days. Pt reports his CBG have been fluctuating from 200-300 with some days around 160-180. No other issues related to medications. Pt states he has not had another A1C that he is aware of since Oct. Will continue to reiterated on the interventions and reinforce better eating habits along with  increase mobility for improving this daily glucose levels..  Patient Goals/Self-Care Activities: Patient will self administer medications as prescribed Patient will attend all scheduled provider appointments Patient will call pharmacy for medication refills Patient will attend church or other social activities Patient will continue to perform ADL's independently Patient will continue to perform IADL's independently Patient will call provider office for new concerns or questions  Follow Up Plan:  Telephone follow up appointment with care management team member scheduled for:  Feb 2023        [name], RN Care Management Coordinator [place] Office [phone]

## 2021-05-25 ENCOUNTER — Other Ambulatory Visit: Payer: Self-pay | Admitting: *Deleted

## 2021-05-25 ENCOUNTER — Other Ambulatory Visit: Payer: Medicare Other | Admitting: *Deleted

## 2021-05-25 NOTE — Patient Outreach (Signed)
[facility]) Care Management  05/25/2021  Paul [name] 09/25/1950 [id number]   Telephone Assessment: RE: Call back  RN attempted to return another outreach call to pt as requested however pt has requested another call back later in the week. Offered to call back Friday afternoon (receptive).  RN will follow up on Friday with another outreach call for ongoing [facility] services.  [name], RN Care Management Coordinator [place] Office [phone]

## 2021-05-25 NOTE — Patient Outreach (Signed)
[place] [facility]) Care Management  05/25/2021  Rachid Parham Aug 28, 1950 [id number]   Telephone Assessment-Re: Call Back  RN spoke briefly with pt today however pt requested a call back later today.  RN will follow up later today as requested. Will also update provider on pt's disposition with [facility] services.  [name], RN Care Management Coordinator [place] Office [phone]

## 2021-05-26 DIAGNOSIS — I10 Essential (primary) hypertension: Secondary | ICD-10-CM | POA: Diagnosis not present

## 2021-05-26 DIAGNOSIS — E785 Hyperlipidemia, unspecified: Secondary | ICD-10-CM | POA: Diagnosis not present

## 2021-05-26 DIAGNOSIS — E1165 Type 2 diabetes mellitus with hyperglycemia: Secondary | ICD-10-CM | POA: Diagnosis not present

## 2021-05-26 DIAGNOSIS — I2510 Atherosclerotic heart disease of native coronary artery without angina pectoris: Secondary | ICD-10-CM | POA: Diagnosis not present

## 2021-05-26 DIAGNOSIS — J449 Chronic obstructive pulmonary disease, unspecified: Secondary | ICD-10-CM | POA: Diagnosis not present

## 2021-05-26 DIAGNOSIS — T82855D Stenosis of coronary artery stent, subsequent encounter: Secondary | ICD-10-CM | POA: Diagnosis not present

## 2021-05-27 ENCOUNTER — Other Ambulatory Visit: Payer: Self-pay | Admitting: *Deleted

## 2021-05-27 DIAGNOSIS — T82855D Stenosis of coronary artery stent, subsequent encounter: Secondary | ICD-10-CM | POA: Diagnosis not present

## 2021-05-27 DIAGNOSIS — J449 Chronic obstructive pulmonary disease, unspecified: Secondary | ICD-10-CM | POA: Diagnosis not present

## 2021-05-27 DIAGNOSIS — I10 Essential (primary) hypertension: Secondary | ICD-10-CM | POA: Diagnosis not present

## 2021-05-27 DIAGNOSIS — I2510 Atherosclerotic heart disease of native coronary artery without angina pectoris: Secondary | ICD-10-CM | POA: Diagnosis not present

## 2021-05-27 DIAGNOSIS — E785 Hyperlipidemia, unspecified: Secondary | ICD-10-CM | POA: Diagnosis not present

## 2021-05-27 DIAGNOSIS — E1165 Type 2 diabetes mellitus with hyperglycemia: Secondary | ICD-10-CM | POA: Diagnosis not present

## 2021-05-27 NOTE — Patient Instructions (Signed)
Visit Information  Thank you for taking time to visit with me today. Please don't hesitate to contact me if I can be of assistance to you before our next scheduled telephone appointment.  Following are the goals we discussed today:   Patient will self administer medications as prescribed Patient will attend all scheduled provider appointments Patient will call pharmacy for medication refills Patient will attend church or other social activities Patient will continue to perform ADL's independently Patient will continue to perform IADL's independently Patient will call provider office for new concerns or questions

## 2021-05-27 NOTE — Patient Outreach (Signed)
[facility]) Care Management Telephonic RN Care Manager Note   05/27/2021 Name:  Paul [name] MRN:  [id number] DOB:  Sep 19, 1950  Recommendations/Changes made from today's visit: Updates documented within the plan of care.  Subjective: Paul [name] is an 71 y.o. year old male who is a primary patient of Donnajean Lopes, MD. The care management team was consulted for assistance with care management and/or care coordination needs.    Telephonic RN Care Manager completed Telephone Visit today.  Objective:   Medications Reviewed Today     Reviewed by Nickolas Madrid, CMA (Certified Medical Assistant) on 05/19/21 at 1545  Med List Status: <None>   Medication Order Taking? Sig Documenting Provider Last Dose Status Informant  acetaminophen (TYLENOL) 500 MG tablet [id number] Yes Take 1,500-2,000 mg by mouth daily. [provider] Taking Active Self  albuterol (PROVENTIL) (2.5 MG/3ML) 0.083% nebulizer solution [id number] Yes Take 2.5 mg by nebulization every 4 (four) hours as needed for wheezing or shortness of breath. [provider] Taking Active Self  albuterol (VENTOLIN HFA) 108 (90 Base) MCG/ACT inhaler [id number] Yes Inhale 2 puffs into the lungs every 4 (four) hours as needed for wheezing or shortness of breath. [provider] Taking Active Self  allopurinol (ZYLOPRIM) 100 MG tablet [id number] No Take 100 mg by mouth daily.  Patient not taking: Reported on 05/19/2021   [provider] Not Taking Active Self  allopurinol (ZYLOPRIM) 100 MG tablet [id number] No Take 1 tablet (100 mg total) by mouth daily.  Patient not taking: Reported on 05/19/2021    Not Taking Active   amLODipine (NORVASC) 5 MG tablet [id number] Yes Take 5 mg by mouth daily. Donnajean Lopes, MD Taking Active   amLODipine (NORVASC) 5 MG tablet [id number] Yes Take 1 tablet (5 mg total) by mouth daily.  Taking Active   amLODipine (NORVASC) 5 MG tablet [id number] Yes Take 1  tablet by mouth daily. [provider] Taking Active   aspirin EC 81 MG EC tablet [id number] Yes Take 1 tablet (81 mg total) by mouth daily. Swallow whole. Elgie Collard, PA-C Taking Active   benzonatate (TESSALON PERLES) 100 MG capsule [id number] Yes Take 1 capsule (100 mg total) by mouth 3 (three) times daily as needed for cough. Loel Dubonnet, NP Taking Active   clopidogrel (PLAVIX) 75 MG tablet [id number] Yes Take 1 tablet (75 mg total) by mouth daily. Cathlyn Parsons, PA-C Taking Active Self  gabapentin (NEURONTIN) 300 MG capsule [id number] Yes Take 600 mg by mouth 2 (two) times daily. [provider] Taking Active Self  insulin aspart (NOVOLOG) 100 UNIT/ML injection [id number] No Inject 0-24 Units into the skin 3 (three) times daily with meals.  Patient not taking: Reported on 05/19/2021   Elgie Collard, PA-C Not Taking Active   insulin detemir (LEVEMIR FLEXTOUCH) 100 UNIT/ML FlexPen [id number] Yes Inject 40 Units into the skin daily.  Taking Active   Insulin Glargine (BASAGLAR KWIKPEN) 100 UNIT/ML [id number]  Inject 20 Units into the skin daily. Elgie Collard, PA-C  Active   insulin lispro (HUMALOG) 100 UNIT/ML KwikPen [id number] Yes Inject 10 Units into the skin every morning AND 6 Units daily with lunch AND 6 Units daily with supper.  Patient taking differently: Inject 14Units into the skin every morning AND 14 Units daily with lunch AND 14Units daily with supper.    Taking Active   loratadine (CLARITIN) 10 MG tablet [id number] No Take 1 tablet (10 mg total) by mouth daily.  Patient not taking: Reported on 05/19/2021   Cathlyn Parsons, PA-C Not Taking Active Self  menthol-cetylpyridinium (CEPACOL) 3 MG lozenge [id number] Yes Take 1 lozenge (3 mg total) by mouth as needed for sore throat. Elgie Collard, PA-C Taking Active   metFORMIN (GLUCOPHAGE) 1000 MG tablet [id number] No Take 1,000 mg by mouth 2 (two) times daily.  Patient not taking: Reported on 05/19/2021   [provider] Not Taking Active   metFORMIN (GLUCOPHAGE-XR) 500 MG 24 hr tablet [id number] Yes Take 2 tablets (1,000 mg total) by mouth 2 (two) times daily for diabetes.  Taking Active   metoprolol tartrate (LOPRESSOR) 25 MG tablet [id number] Yes Take 0.5 tablets (12.5 mg total) by mouth 2 (two) times daily. Loel Dubonnet, NP Taking Active   metoprolol tartrate (LOPRESSOR) 25 MG tablet [id number] Yes Take 1 tablet (25 mg total) by mouth 2 (two) times daily.  Taking Active   nitroGLYCERIN (NITROSTAT) 0.4 MG SL tablet [id number] Yes one tablet under tongue, q5 minutes for chest pain [provider] Taking Active   pantoprazole (PROTONIX) 40 MG tablet [id number] No Take 1 tablet (40 mg total) by mouth daily.  Patient not taking: Reported on 05/19/2021    Not Taking Active   polyethylene glycol (MIRALAX / GLYCOLAX) 17 g packet [id number] Yes Take 17 g by mouth daily as needed for mild constipation. [provider] Taking Active Self  rosuvastatin (CRESTOR) 20 MG tablet [id number]  Take 1 tablet (20 mg total) by mouth daily. Martinique, [name], MD  Active Self  rosuvastatin (CRESTOR) 20 MG tablet [id number] No Take 1 tablet (20 mg total) by mouth daily.  Patient not taking: Reported on 05/19/2021    Not Taking Active   tamsulosin (FLOMAX) 0.4 MG CAPS capsule [id number] No Take 1 capsule (0.4 mg total) by mouth daily.  Patient not taking: Reported on 05/19/2021   Miguel Aschoff Not Taking Active   Tetrahydrozoline HCl Karma Lew [place] OP) [id number] Yes Place 1 drop into both eyes as needed (itching). [provider] Taking Active Self  traMADol (ULTRAM) 50 MG tablet [id number] Yes Take 1 tablet (50 mg total) by mouth every 6 (six) hours as needed. Lars Pinks M, PA-C Taking Active   umeclidinium-vilanterol [facility] ELLIPTA) 62.5-25 MCG/INH AEPB [id number] Yes Inhale 1 puff into the lungs daily. Lauraine Rinne, NP Taking Active Self  Vitamin D, Ergocalciferol, (DRISDOL) 1.25 MG (50000  UNIT) CAPS capsule [id number] Yes Take 50,000 Units by mouth once a week. [provider] Taking Active Self  Vitamin D, Ergocalciferol, (DRISDOL) 1.25 MG (50000 UNIT) CAPS capsule [id number] No Take 1 capsule (50,000 Units total) by mouth once a week.  Patient not taking: Reported on 05/19/2021    Not Taking Active   zolpidem (AMBIEN) 10 MG tablet [id number] Yes Take 10 mg by mouth at bedtime. [provider] Taking Active Self             SDOH:  (Social Determinants of Health) assessments and interventions performed:     Care Plan  Review of patient past medical history, allergies, medications, health status, including review of consultants reports, laboratory and other test data, was performed as part of comprehensive evaluation for care management services.   Care Plan : RN Care Manager Plan of Care  Updates made by Tobi Bastos, RN since 05/27/2021 12:00 AM     Problem: Knowledge Deficit related to Diabetes and Care Coordination Needs   Priority: High     Long-Range  Goal: Development plan of care for management of Diabetes   Start Date: 03/07/2021  Expected End Date: 11/14/2021  This Visit's Progress: On track  Recent Progress: On track  Priority: High  Note:   Current Barriers:  Knowledge Deficits related to plan of care for management of DMII Chronic Disease Management support and education needs related to DMII  RNCM Clinical Goal(s):  Patient will verbalize understanding of plan for management of DMII verbalize basic understanding of  DMII disease process and self health management plan utilizing the discussed plan of care and information provided today. take all medications exactly as prescribed and will call provider for medication related questions attend all scheduled medical appointments: Continuation of monitoring blood sugars and adhering to diabetic diet . continue to work with RN Care Manager to address care management and care  coordination needs related to  DMII through collaboration with RN Care manager, provider, and care team.   Interventions: Evaluation of current treatment plan related to  self management and patient's adherence to plan as established by provider  Diabetes Interventions: Assessed patient's understanding of A1c goal: <6.5% Provided education to patient about basic DM disease process; Counseled on importance of regular laboratory monitoring as prescribed; Advised patient, providing education and rationale, to check cbg TID and record, calling primary provider for findings outside established parameters; Lab Results  Component Value Date   HGBA1C 8.8 (H) 01/21/2021  Update 12/20:Pt reports his A1C should be lower with the recent changes in he dietary habits. Pt states a recent visit with his primary provider to concurs with the expectance of a improved readings. Pt continue to manage his diabetes with baseline readings and no experience of hypo-hyperglycemia readouts. Will continue review of the above goals and interventions and encouraged adherence to all discussed. 1/18- Pt reports issues with one of his medications Levemir. States pharmacy indicates back order and last dose was from his provider's office. Pt only has 4 dose/days left and needing a refill however having difficulty getting through to his provider. RN offered to intervene and was able to call and leave Dr. Shon Baton nurse a voice message. Will follow up over the next few weeks concerning any interventions provided by the provider's office. Pt aware to call RN back if no call back from the provider's office over the next few days. Pt reports his CBG have been fluctuating from 200-300 with some days around 160-180. No other issues related to medications. Pt states he has not had another A1C that he is aware of since Oct. Will continue to reiterated on the interventions and reinforce better eating habits along with increase mobility for  improving this daily glucose levels. 2/10 Pt reports issues with receiving his insulin from his local pharmacy [facility]) reporting limited supplies and referring pt to go to [facility] who is requesting pt cover the entire amount of his 4 pens for 90 day supply. Pt unable to afford and move to [place] for all his medications moving forward. Pt was having CBG readings around 209 with this incident while not receiving the full coverage of insulin but reports today's with the new pharmacy and receival of medications CBG at 176-187 asymptomatic. Discussed healthier eating habits and confirmed no high or low readings have been experienced. Pt states his son will be working on his medication management this weekend. RN offered Hanna to intervene if no resolution or pt remains confused with his medication management. RN also encourage pt to reach out to his provider on  any other issues related to his medications. Provided RN contact number and offered to follow up in a few weeks for an update on his ongoing management of care related to his medications.   Patient Goals/Self-Care Activities: Patient will self administer medications as prescribed Patient will attend all scheduled provider appointments Patient will call pharmacy for medication refills Patient will attend church or other social activities Patient will continue to perform ADL's independently Patient will continue to perform IADL's independently Patient will call provider office for new concerns or questions  Follow Up Plan:  Telephone follow up appointment with care management team member scheduled for:  March 2023        [name], RN Care Management Coordinator [place] Office [phone]

## 2021-05-30 DIAGNOSIS — I10 Essential (primary) hypertension: Secondary | ICD-10-CM | POA: Diagnosis not present

## 2021-05-30 DIAGNOSIS — E1165 Type 2 diabetes mellitus with hyperglycemia: Secondary | ICD-10-CM | POA: Diagnosis not present

## 2021-05-30 DIAGNOSIS — I2510 Atherosclerotic heart disease of native coronary artery without angina pectoris: Secondary | ICD-10-CM | POA: Diagnosis not present

## 2021-05-30 DIAGNOSIS — T82855D Stenosis of coronary artery stent, subsequent encounter: Secondary | ICD-10-CM | POA: Diagnosis not present

## 2021-05-30 DIAGNOSIS — J449 Chronic obstructive pulmonary disease, unspecified: Secondary | ICD-10-CM | POA: Diagnosis not present

## 2021-05-30 DIAGNOSIS — E785 Hyperlipidemia, unspecified: Secondary | ICD-10-CM | POA: Diagnosis not present

## 2021-06-03 ENCOUNTER — Other Ambulatory Visit (HOSPITAL_COMMUNITY): Payer: Self-pay

## 2021-06-03 DIAGNOSIS — T82855D Stenosis of coronary artery stent, subsequent encounter: Secondary | ICD-10-CM | POA: Diagnosis not present

## 2021-06-03 DIAGNOSIS — I2510 Atherosclerotic heart disease of native coronary artery without angina pectoris: Secondary | ICD-10-CM | POA: Diagnosis not present

## 2021-06-03 DIAGNOSIS — I10 Essential (primary) hypertension: Secondary | ICD-10-CM | POA: Diagnosis not present

## 2021-06-03 DIAGNOSIS — J449 Chronic obstructive pulmonary disease, unspecified: Secondary | ICD-10-CM | POA: Diagnosis not present

## 2021-06-03 DIAGNOSIS — E785 Hyperlipidemia, unspecified: Secondary | ICD-10-CM | POA: Diagnosis not present

## 2021-06-03 DIAGNOSIS — E1165 Type 2 diabetes mellitus with hyperglycemia: Secondary | ICD-10-CM | POA: Diagnosis not present

## 2021-06-03 MED ORDER — ZOLPIDEM TARTRATE 10 MG PO TABS
10.0000 mg | ORAL_TABLET | Freq: Every evening | ORAL | 0 refills | Status: DC | PRN
Start: 2021-06-03 — End: 2021-07-05
  Filled 2021-06-03: qty 30, 30d supply, fill #0

## 2021-06-06 DIAGNOSIS — T82855D Stenosis of coronary artery stent, subsequent encounter: Secondary | ICD-10-CM | POA: Diagnosis not present

## 2021-06-06 DIAGNOSIS — E785 Hyperlipidemia, unspecified: Secondary | ICD-10-CM | POA: Diagnosis not present

## 2021-06-06 DIAGNOSIS — I2510 Atherosclerotic heart disease of native coronary artery without angina pectoris: Secondary | ICD-10-CM | POA: Diagnosis not present

## 2021-06-06 DIAGNOSIS — J449 Chronic obstructive pulmonary disease, unspecified: Secondary | ICD-10-CM | POA: Diagnosis not present

## 2021-06-06 DIAGNOSIS — E1165 Type 2 diabetes mellitus with hyperglycemia: Secondary | ICD-10-CM | POA: Diagnosis not present

## 2021-06-06 DIAGNOSIS — I10 Essential (primary) hypertension: Secondary | ICD-10-CM | POA: Diagnosis not present

## 2021-06-08 ENCOUNTER — Other Ambulatory Visit (HOSPITAL_COMMUNITY): Payer: Self-pay

## 2021-06-08 DIAGNOSIS — I10 Essential (primary) hypertension: Secondary | ICD-10-CM | POA: Diagnosis not present

## 2021-06-08 DIAGNOSIS — Z6831 Body mass index (BMI) 31.0-31.9, adult: Secondary | ICD-10-CM | POA: Diagnosis not present

## 2021-06-08 DIAGNOSIS — Q762 Congenital spondylolisthesis: Secondary | ICD-10-CM | POA: Diagnosis not present

## 2021-06-08 MED ORDER — ALBUTEROL SULFATE HFA 108 (90 BASE) MCG/ACT IN AERS
2.0000 | INHALATION_SPRAY | RESPIRATORY_TRACT | 0 refills | Status: DC | PRN
  Filled 2021-06-08: qty 6.7, 17d supply, fill #0

## 2021-06-08 MED ORDER — INSULIN ASPART 100 UNIT/ML IJ SOLN: 2.0000 [IU] | Freq: Three times a day (TID) | INTRAMUSCULAR | 0 refills | Status: DC

## 2021-06-08 MED ORDER — ONETOUCH VERIO VI STRP
ORAL_STRIP | 3 refills | Status: AC
  Filled 2021-06-28: qty 50, 30d supply, fill #0
  Filled 2021-06-28: qty 100, 30d supply, fill #0

## 2021-06-08 MED ORDER — UMECLIDINIUM-VILANTEROL 62.5-25 MCG/ACT IN AEPB: 1.0000 | INHALATION_SPRAY | Freq: Every day | RESPIRATORY_TRACT | 0 refills | Status: DC

## 2021-06-08 MED ORDER — CLOPIDOGREL BISULFATE 75 MG PO TABS
75.0000 mg | ORAL_TABLET | Freq: Every day | ORAL | 0 refills | Status: DC
  Filled 2021-06-08: qty 30, 30d supply, fill #0

## 2021-06-08 MED ORDER — FUROSEMIDE 20 MG PO TABS
20.0000 mg | ORAL_TABLET | Freq: Every day | ORAL | 0 refills | Status: DC
  Filled 2021-06-08: qty 30, 30d supply, fill #0

## 2021-06-08 MED ORDER — GABAPENTIN 300 MG PO CAPS: 600.0000 mg | ORAL_CAPSULE | Freq: Two times a day (BID) | ORAL | 0 refills | Status: DC

## 2021-06-08 MED ORDER — INSULIN PEN NEEDLE 32G X 4 MM MISC: 3 refills | Status: DC

## 2021-06-08 MED ORDER — OMEPRAZOLE 40 MG PO CPDR: 40.0000 mg | DELAYED_RELEASE_CAPSULE | Freq: Every day | ORAL | 3 refills | Status: DC

## 2021-06-08 MED ORDER — ROSUVASTATIN CALCIUM 20 MG PO TABS
20.0000 mg | ORAL_TABLET | Freq: Every day | ORAL | 0 refills | Status: DC
  Filled 2021-06-08: qty 30, 30d supply, fill #0

## 2021-06-08 MED ORDER — ASPIRIN 81 MG PO TBEC: 81.0000 mg | DELAYED_RELEASE_TABLET | Freq: Every day | ORAL | 0 refills | Status: DC

## 2021-06-08 MED ORDER — ALBUTEROL SULFATE (2.5 MG/3ML) 0.083% IN NEBU
3.0000 mL | INHALATION_SOLUTION | RESPIRATORY_TRACT | 0 refills | Status: DC | PRN
  Filled 2021-06-08: qty 525, 30d supply, fill #0

## 2021-06-09 DIAGNOSIS — I10 Essential (primary) hypertension: Secondary | ICD-10-CM | POA: Diagnosis not present

## 2021-06-09 DIAGNOSIS — T82855D Stenosis of coronary artery stent, subsequent encounter: Secondary | ICD-10-CM | POA: Diagnosis not present

## 2021-06-09 DIAGNOSIS — E1165 Type 2 diabetes mellitus with hyperglycemia: Secondary | ICD-10-CM | POA: Diagnosis not present

## 2021-06-09 DIAGNOSIS — J449 Chronic obstructive pulmonary disease, unspecified: Secondary | ICD-10-CM | POA: Diagnosis not present

## 2021-06-09 DIAGNOSIS — I2510 Atherosclerotic heart disease of native coronary artery without angina pectoris: Secondary | ICD-10-CM | POA: Diagnosis not present

## 2021-06-09 DIAGNOSIS — E785 Hyperlipidemia, unspecified: Secondary | ICD-10-CM | POA: Diagnosis not present

## 2021-06-13 ENCOUNTER — Encounter (HOSPITAL_BASED_OUTPATIENT_CLINIC_OR_DEPARTMENT_OTHER): Payer: Self-pay | Admitting: Nurse Practitioner

## 2021-06-13 ENCOUNTER — Other Ambulatory Visit: Payer: Self-pay

## 2021-06-13 ENCOUNTER — Other Ambulatory Visit (HOSPITAL_COMMUNITY): Payer: Self-pay

## 2021-06-13 ENCOUNTER — Ambulatory Visit (INDEPENDENT_AMBULATORY_CARE_PROVIDER_SITE_OTHER): Payer: Medicare Other | Admitting: Nurse Practitioner

## 2021-06-13 VITALS — BP 128/72 | HR 72 | Ht 65.0 in | Wt 184.0 lb

## 2021-06-13 DIAGNOSIS — E1142 Type 2 diabetes mellitus with diabetic polyneuropathy: Secondary | ICD-10-CM

## 2021-06-13 DIAGNOSIS — G4700 Insomnia, unspecified: Secondary | ICD-10-CM | POA: Insufficient documentation

## 2021-06-13 DIAGNOSIS — G8929 Other chronic pain: Secondary | ICD-10-CM | POA: Insufficient documentation

## 2021-06-13 DIAGNOSIS — I10 Essential (primary) hypertension: Secondary | ICD-10-CM

## 2021-06-13 DIAGNOSIS — E1140 Type 2 diabetes mellitus with diabetic neuropathy, unspecified: Secondary | ICD-10-CM

## 2021-06-13 DIAGNOSIS — K5790 Diverticulosis of intestine, part unspecified, without perforation or abscess without bleeding: Secondary | ICD-10-CM | POA: Insufficient documentation

## 2021-06-13 DIAGNOSIS — G4733 Obstructive sleep apnea (adult) (pediatric): Secondary | ICD-10-CM

## 2021-06-13 DIAGNOSIS — E1165 Type 2 diabetes mellitus with hyperglycemia: Secondary | ICD-10-CM

## 2021-06-13 DIAGNOSIS — I739 Peripheral vascular disease, unspecified: Secondary | ICD-10-CM

## 2021-06-13 DIAGNOSIS — R0609 Other forms of dyspnea: Secondary | ICD-10-CM

## 2021-06-13 DIAGNOSIS — G9332 Myalgic encephalomyelitis/chronic fatigue syndrome: Secondary | ICD-10-CM | POA: Insufficient documentation

## 2021-06-13 DIAGNOSIS — R2681 Unsteadiness on feet: Secondary | ICD-10-CM | POA: Insufficient documentation

## 2021-06-13 DIAGNOSIS — J449 Chronic obstructive pulmonary disease, unspecified: Secondary | ICD-10-CM | POA: Diagnosis not present

## 2021-06-13 DIAGNOSIS — E559 Vitamin D deficiency, unspecified: Secondary | ICD-10-CM | POA: Diagnosis not present

## 2021-06-13 DIAGNOSIS — M159 Polyosteoarthritis, unspecified: Secondary | ICD-10-CM | POA: Insufficient documentation

## 2021-06-13 DIAGNOSIS — M19012 Primary osteoarthritis, left shoulder: Secondary | ICD-10-CM | POA: Diagnosis not present

## 2021-06-13 DIAGNOSIS — G894 Chronic pain syndrome: Secondary | ICD-10-CM | POA: Insufficient documentation

## 2021-06-13 DIAGNOSIS — I2510 Atherosclerotic heart disease of native coronary artery without angina pectoris: Secondary | ICD-10-CM | POA: Diagnosis not present

## 2021-06-13 DIAGNOSIS — K5901 Slow transit constipation: Secondary | ICD-10-CM

## 2021-06-13 DIAGNOSIS — N1831 Chronic kidney disease, stage 3a: Secondary | ICD-10-CM

## 2021-06-13 DIAGNOSIS — E785 Hyperlipidemia, unspecified: Secondary | ICD-10-CM

## 2021-06-13 DIAGNOSIS — M25551 Pain in right hip: Secondary | ICD-10-CM | POA: Insufficient documentation

## 2021-06-13 DIAGNOSIS — F5104 Psychophysiologic insomnia: Secondary | ICD-10-CM | POA: Insufficient documentation

## 2021-06-13 DIAGNOSIS — N400 Enlarged prostate without lower urinary tract symptoms: Secondary | ICD-10-CM | POA: Insufficient documentation

## 2021-06-13 DIAGNOSIS — Z794 Long term (current) use of insulin: Secondary | ICD-10-CM

## 2021-06-13 DIAGNOSIS — R531 Weakness: Secondary | ICD-10-CM

## 2021-06-13 DIAGNOSIS — T82855D Stenosis of coronary artery stent, subsequent encounter: Secondary | ICD-10-CM | POA: Diagnosis not present

## 2021-06-13 DIAGNOSIS — R29898 Other symptoms and signs involving the musculoskeletal system: Secondary | ICD-10-CM | POA: Insufficient documentation

## 2021-06-13 HISTORY — DX: Pain in right hip: M25.551

## 2021-06-13 HISTORY — DX: Myalgic encephalomyelitis/chronic fatigue syndrome: G93.32

## 2021-06-13 HISTORY — DX: Slow transit constipation: K59.01

## 2021-06-13 MED ORDER — ALBUTEROL SULFATE HFA 108 (90 BASE) MCG/ACT IN AERS
2.0000 | INHALATION_SPRAY | RESPIRATORY_TRACT | 3 refills | Status: DC | PRN
  Filled 2021-06-13: qty 18, 17d supply, fill #0

## 2021-06-13 MED ORDER — GABAPENTIN 300 MG PO CAPS
600.0000 mg | ORAL_CAPSULE | Freq: Two times a day (BID) | ORAL | 11 refills | Status: DC
  Filled 2021-06-13: qty 120, 30d supply, fill #0
  Filled 2021-07-05: qty 120, 30d supply, fill #1

## 2021-06-13 MED ORDER — UMECLIDINIUM-VILANTEROL 62.5-25 MCG/ACT IN AEPB
1.0000 | INHALATION_SPRAY | Freq: Every day | RESPIRATORY_TRACT | 5 refills | Status: DC
  Filled 2021-06-13: qty 60, 30d supply, fill #0

## 2021-06-13 MED ORDER — METFORMIN HCL ER 500 MG PO TB24
1000.0000 mg | ORAL_TABLET | Freq: Two times a day (BID) | ORAL | 3 refills | Status: DC
  Filled 2021-06-13: qty 120, 30d supply, fill #0
  Filled 2021-07-05: qty 120, 30d supply, fill #1
  Filled 2021-08-01: qty 120, 30d supply, fill #2

## 2021-06-13 MED ORDER — ALBUTEROL SULFATE (2.5 MG/3ML) 0.083% IN NEBU
2.5000 mg | INHALATION_SOLUTION | RESPIRATORY_TRACT | 6 refills | Status: DC | PRN
  Filled 2021-06-13: qty 75, 5d supply, fill #0

## 2021-06-13 NOTE — Assessment & Plan Note (Addendum)
>>  ASSESSMENT AND PLAN FOR OSA AND COPD OVERLAP SYNDROME (HCC) WRITTEN ON 04/23/2022  9:44 PM BY Aquita Simmering E, NP  >>ASSESSMENT AND PLAN FOR OSA AND COPD OVERLAP SYNDROME (HCC) WRITTEN ON 06/13/2021  6:19 PM BY Liliauna Santoni E, NP  Chronic. No CPAP.  Patient would benefit from pulmonology evaluation for management and recommendations.   >>ASSESSMENT AND PLAN FOR CHRONIC OBSTRUCTIVE PULMONARY DISEASE, UNSPECIFIED (HCC) WRITTEN ON 06/13/2021  6:18 PM BY Ladarren Steiner E, NP  Chronic. Wheezing present today.  Will send refills on inhalers for management.  No currently followed by pulmonology, but this would be beneficial to patients overall health.  Will discuss in future visits.   >>ASSESSMENT AND PLAN FOR DOE (DYSPNEA ON EXERTION) WRITTEN ON 06/13/2021  6:23 PM BY Tiombe Tomeo E, NP  See COPD

## 2021-06-13 NOTE — Patient Instructions (Addendum)
Thank you for choosing [place] at [facility] for your Primary Care needs. I am excited for the opportunity to partner with you to meet your health care goals. It was a pleasure meeting you today!  Recommendations from today's visit: We will get some labs today and see what everything is looking like. I will be in touch with you once we have all of the results to let you know if we need to make any changes.  I have sent in refills of medications that look like they need to be refilled. I also added what you take the medication for on the bottle to help you know what the medications are for I would like to see you back in 2 months so we can continue to go over your conditions. I will have a chance to review your records and then we can make a game plan based on your records and your labs.   Information on diet, exercise, and health maintenance recommendations are listed below. This is information to help you be sure you are on track for optimal health and monitoring.   Please look over this and let us know if you have any questions or if you have completed any of the health maintenance outside of [place] so that we can be sure your records are up to date.  ___________________________________________________________ About Me: I am an Adult-Geriatric Nurse Practitioner with a background in caring for patients for more than 20 years with a strong intensive care background. I provide primary care and sports medicine services to patients age 68 and older within this office. My education had a strong focus on caring for the older adult population, which I am passionate about. I am also the director of the APP Fellowship with [facility].   My desire is to provide you with the best service through preventive medicine and supportive care. I consider you a part of the medical team and value your input. I work diligently to ensure that you are heard and your needs are met in a safe and  effective manner. I want you to feel comfortable with me as your provider and want you to know that your health concerns are important to me.  For your information, our office hours are: Monday, Tuesday, and Thursday 8:00 AM - 5:00 PM Wednesday and Friday 8:00 AM - 12:00 PM.   In my time away from the office I am teaching new APP's within the system and am unavailable, but my partner, Dr. Burnard Bunting is in the office for emergent needs.   If you have questions or concerns, please call our office at [phone] or send [place] a MyChart message and we will respond as quickly as possible.  ____________________________________________________________ MyChart:  For all urgent or time sensitive needs we ask that you please call the office to avoid delays. Our number is [phone]. MyChart is not constantly monitored and due to the large volume of messages a day, replies may take up to 72 business hours.  MyChart Policy: MyChart allows for you to see your visit notes, after visit summary, provider recommendations, lab and tests results, make an appointment, request refills, and contact your provider or the office for non-urgent questions or concerns. Providers are seeing patients during normal business hours and do not have built in time to review MyChart messages.  We ask that you allow a minimum of 3 business days for responses to [employer]. For this reason, please do not send urgent  requests through [place]. Please call the office at [phone]. New and ongoing conditions may require a visit. We have virtual and in person visit available for your convenience.  Complex MyChart concerns may require a visit. Your provider may request you schedule a virtual or in person visit to ensure we are providing the best care possible. MyChart messages sent after 11:00 AM on Friday will not be received by the provider until Monday morning.    Lab and Test Results: You will receive your lab and test results  on MyChart as soon as they are completed and results have been sent by the lab or testing facility. Due to this service, you will receive your results BEFORE your provider.  I review lab and tests results each morning prior to seeing patients. Some results require collaboration with other providers to ensure you are receiving the most appropriate care. For this reason, we ask that you please allow a minimum of 3-5 business days from the time the ALL results have been received for your provider to receive and review lab and test results and contact you about these.  Most lab and test result comments from the provider will be sent through [place]. Your provider may recommend changes to the plan of care, follow-up visits, repeat testing, ask questions, or request an office visit to discuss these results. You may reply directly to this message or call the office at [phone] to provide information for the provider or set up an appointment. In some instances, you will be called with test results and recommendations. Please let us know if this is preferred and we will make note of this in your chart to provide this for you.    If you have not heard a response to your lab or test results in 5 business days from all results returning to [place], please call the office to let us know. We ask that you please avoid calling prior to this time unless there is an emergent concern. Due to high call volumes, this can delay the resulting process.  After Hours: For all non-emergency after hours needs, please call the office at [phone] and select the option to reach the on-call provider service. On-call services are shared between multiple [place] offices and therefore it will not be possible to speak directly with your provider. On-call providers may provide medical advice and recommendations, but are unable to provide refills for maintenance medications.  For all emergency or urgent medical needs after normal  business hours, we recommend that you seek care at the closest Urgent Care or Emergency Department to ensure appropriate treatment in a timely manner.  [facility] [place] has a 24 hour emergency room located on the ground floor for your convenience.   Urgent Concerns During the Business Day Providers are seeing patients from 8AM to [place] with a busy schedule and are most often not able to respond to non-urgent calls until the end of the day or the next business day. If you should have URGENT concerns during the day, please call and speak to the nurse or schedule a same day appointment so that we can address your concern without delay.   Thank you, again, for choosing me as your health care partner. I appreciate your trust and look forward to learning more about you.   Worthy Keeler, DNP, AGNP-c ___________________________________________________________  Health Maintenance Recommendations Screening Testing Mammogram Every 1 -2 years based on history and risk factors Starting at age 69 Pap Smear Ages 21-39  every 3 years Ages 10-65 every 5 years with HPV testing More frequent testing may be required based on results and history Colon Cancer Screening Every 1-10 years based on test performed, risk factors, and history Starting at age 7 Bone Density Screening Every 2-10 years based on history Starting at age 68 for women Recommendations for men differ based on medication usage, history, and risk factors AAA Screening One time ultrasound Men 50-69 years old who have every smoked Lung Cancer Screening Low Dose Lung CT every 12 months Age 66-80 years with a 30 pack-year smoking history who still smoke or who have quit within the last 15 years  Screening Labs Routine  Labs: Complete Blood Count (CBC), Complete Metabolic Panel (CMP), Cholesterol (Lipid Panel) Every 6-12 months based on history and medications May be recommended more frequently based on current conditions  or previous results Hemoglobin A1c Lab Every 3-12 months based on history and previous results Starting at age 72 or earlier with diagnosis of diabetes, high cholesterol, BMI >26, and/or risk factors Frequent monitoring for patients with diabetes to ensure blood sugar control Thyroid Panel (TSH w/ T3 & T4) Every 6 months based on history, symptoms, and risk factors May be repeated more often if on medication HIV One time testing for all patients 38 and older May be repeated more frequently for patients with increased risk factors or exposure Hepatitis C One time testing for all patients 38 and older May be repeated more frequently for patients with increased risk factors or exposure Gonorrhea, Chlamydia Every 12 months for all sexually active persons 13-24 years Additional monitoring may be recommended for those who are considered high risk or who have symptoms PSA Men 20-10 years old with risk factors Additional screening may be recommended from age 1-69 based on risk factors, symptoms, and history  Vaccine Recommendations Tetanus Booster All adults every 10 years Flu Vaccine All patients 6 months and older every year COVID Vaccine All patients 12 years and older Initial dosing with booster May recommend additional booster based on age and health history HPV Vaccine 2 doses all patients age 47-26 Dosing may be considered for patients over 26 Shingles Vaccine (Shingrix) 2 doses all adults 36 years and older Pneumonia (Pneumovax 23) All adults 8 years and older May recommend earlier dosing based on health history Pneumonia (Prevnar 25) All adults 6 years and older Dosed 1 year after Pneumovax 23  Additional Screening, Testing, and Vaccinations may be recommended on an individualized basis based on family history, health history, risk factors, and/or exposure.  __________________________________________________________  Diet Recommendations for All Patients  I recommend  that all patients maintain a diet low in saturated fats, carbohydrates, and cholesterol. While this can be challenging at first, it is not impossible and small changes can make big differences.  Things to try: Decreasing the amount of soda, sweet tea, and/or juice to one or less per day and replace with water While water is always the first choice, if you do not like water you may consider adding a water additive without sugar to improve the taste other sugar free drinks Replace potatoes with a brightly colored vegetable at dinner Use healthy oils, such as canola oil or olive oil, instead of butter or hard margarine Limit your bread intake to two pieces or less a day Replace regular pasta with low carb pasta options Bake, broil, or grill foods instead of frying Monitor portion sizes  Eat smaller, more frequent meals throughout the day instead of large meals  An important thing to remember is, if you love foods that are not great for your health, you don't have to give them up completely. Instead, allow these foods to be a reward when you have done well. Allowing yourself to still have special treats every once in a while is a nice way to tell yourself thank you for working hard to keep yourself healthy.   Also remember that every day is a new day. If you have a bad day and "fall off the wagon", you can still climb right back up and keep moving along on your journey!  We have resources available to help you!  Some websites that may be helpful include: www.http://carter.biz/  Www.VeryWell[employer].com _____________________________________________________________  Activity Recommendations for All Patients  I recommend that all adults get at least 20 minutes of moderate physical activity that elevates your heart rate at least 5 days out of the week.  Some examples include: Walking or jogging at a pace that allows you to carry on a conversation Cycling (stationary bike or outdoors) Water  aerobics Yoga Weight lifting Dancing If physical limitations prevent you from putting stress on your joints, exercise in a pool or seated in a chair are excellent options.  Do determine your MAXIMUM heart rate for activity: YOUR AGE - 220 = MAX HeartRate   Remember! Do not push yourself too hard.  Start slowly and build up your pace, speed, weight, time in exercise, etc.  Allow your body to rest between exercise and get good sleep. You will need more water than normal when you are exerting yourself. Do not wait until you are thirsty to drink. Drink with a purpose of getting in at least 8, 8 ounce glasses of water a day plus more depending on how much you exercise and sweat.    If you begin to develop dizziness, chest pain, abdominal pain, jaw pain, shortness of breath, headache, vision changes, lightheadedness, or other concerning symptoms, stop the activity and allow your body to rest. If your symptoms are severe, seek emergency evaluation immediately. If your symptoms are concerning, but not severe, please let us know so that we can recommend further evaluation.

## 2021-06-13 NOTE — Assessment & Plan Note (Signed)
>>  ASSESSMENT AND PLAN FOR PRIMARY OSTEOARTHRITIS, LEFT SHOULDER WRITTEN ON 06/13/2021  6:22 PM BY Jaja Switalski E, NP  Chronic. Taking gabapentin for pain, but not well controlled at this time.  Recent articular injections caused BG elevation.  Consider referral to orthopedics for further evaluation.

## 2021-06-13 NOTE — Assessment & Plan Note (Signed)
Labs today

## 2021-06-13 NOTE — Assessment & Plan Note (Signed)
See "COPD"

## 2021-06-13 NOTE — Assessment & Plan Note (Signed)
Chronic. Taking gabapentin for pain, but not well controlled at this time.  Recent articular injections caused BG elevation.  Consider referral to orthopedics for further evaluation.

## 2021-06-13 NOTE — Assessment & Plan Note (Signed)
Chronic. Currently on TID insulin, qHS insulin, and metformin.  Diet not well managed at this time. I do feel that we can find significant improvement with further education.  Will review records and consider GLP-1 and Farxiga given co-morbidities to replace insulin. He may also benefit from CGM to help with control and understanding of how food impacts his BG levels.

## 2021-06-13 NOTE — Assessment & Plan Note (Signed)
Chronic. Consider adding Farxiga to medication regimen and adding GLP-1 for improved control while D/C insulin with meals. Will review labs and previous records and plan to discuss this at future visits.

## 2021-06-13 NOTE — Assessment & Plan Note (Signed)
Chronic. Labs today.  

## 2021-06-13 NOTE — Assessment & Plan Note (Signed)
Chronic. Wheezing present today.  Will send refills on inhalers for management.  No currently followed by pulmonology, but this would be beneficial to patients overall health.  Will discuss in future visits.

## 2021-06-13 NOTE — Progress Notes (Signed)
Orma Render, DNP, AGNP-c Primary Care & Sports Medicine [address]   [place], [place] [address] [phone]  New patient visit   Patient: Paul [name]   DOB: January 02, 1951   71 y.o. Male  MRN: [id number] Visit Date: 06/13/2021  Patient Care Team: Shuronda Santino, [name], NP as PCP - General (Nurse Practitioner) Leonie Man, MD as PCP - Cardiology (Cardiology) Tobi Bastos, RN as [place] Management  Today's healthcare provider: Orma Render, NP   Chief Complaint  Patient presents with   New Patient (Initial Visit)    Patient presents today to establish care. He would like lab work. He is concerned about his diabetes. Sugar runs 157 to 160 first thing in the morning. 02/05/21 He had open heart surgery with Dr Oval Linsey. He stated he has pain all over.    Subjective    Paul [name] is a 71 y.o. male who presents today as a new patient to establish care.    Patient endorses the following concerns presently: Diabetes [name] tells me that his blood sugars are running higher than he would like despite using his insulin for management. He tells me that he has a hard time adjusting his diet for low carbohydrates, but endorses frustration over health concerns and complications associated with diabetes. He tells me that he wants to be healthy again. He has chronic paresthesias of the feet bilaterally related to his DM. He does not want this to get worse.   [name] had cardiac bypass in October 2022 and is recovering from this. He is frustrated with the slow recovery and endorses itching and mild pain to the left precordium on a nightly basis. He tells me that he thought that he would have fully recovered by now. He does endorse improvement with his breathing and how he feels overall, but he reports he did not expect recovery to take but 2-3 months total. He is taking his medication as prescribed. He is not having dizziness or LE edema at this  time.   COPD [name] endorses wheezing and shortness of breath with exertion. He has run out of his inhaler and feels that his breathing is worse since that time.   Pain [name] endorses chronic pain in multiple locations of his body including low back, legs, feet, and bilateral shoulders. He endorses frustration over the pain and tells me that he wants to feel "normal" again so he can do the things he wants to do.   History reviewed and reveals the following: Past Medical History:  Diagnosis Date   CAD 11/24/2011   a) Mild-to-moderate 30-40% lesions in the RCA, LAD and Circumflex. b) CULPRIT LESION: long tubular 70-80% lesion in D1 with FFR of 0.7 --> PCI w/ Xience Xpedition DES 2.5 mm x 30 mm (2.65 MM); c) Lexiscan Myoview 11/2013: No Ischemia or Infarct (Inferior Gut Attenuation) EF 63%.   Constipation by delayed colonic transit 06/13/2021   COPD (chronic obstructive pulmonary disease) ([place])    "don't have full case of it; I'm right there at it"   Diabetes mellitus without complication ([place])    Dysphagia    Essential hypertension 11/24/2011   Exertional dyspnea, chronic    Gout    Hyperlipidemia with target LDL less than 70 11/24/2011   Obesity (BMI 30.0-34.9)    OSA (obstructive sleep apnea), uses oxygen at home did not tolerate cpap 11/24/2011   S/P CABG (coronary artery bypass graft)    Past Surgical  History:  Procedure Laterality Date   CERVICAL SPINE SURGERY  2012   CORONARY ANGIOPLASTY WITH STENT PLACEMENT  11/23/2011   "1; first one"   CORONARY ARTERY BYPASS GRAFT N/A 01/25/2021   Procedure: CORONARY ARTERY BYPASS GRAFTING (CABG) X2, USING LEFT INTERNAL MAMMARY ARTERY AND LEFT LEG GREATER SAPHENOUS VEIN HARVESTED ENDOSCOPICALLY;  Surgeon: Lajuana Matte, MD;  Location: [facility];  Service: Open Heart Surgery;  Laterality: N/A;   ENDOVEIN HARVEST OF GREATER SAPHENOUS VEIN Bilateral 01/25/2021   Procedure: ENDOVEIN HARVEST OF GREATER SAPHENOUS VEIN;  Surgeon: Lajuana Matte,  MD;  Location: [facility];  Service: Open Heart Surgery;  Laterality: Bilateral;   LEFT HEART CATH AND CORONARY ANGIOGRAPHY N/A 12/28/2020   Procedure: LEFT HEART CATH AND CORONARY ANGIOGRAPHY;  Surgeon: Leonie Man, MD;  Location: [facility];  Service: Cardiovascular;  Laterality: N/A;   LEFT HEART CATHETERIZATION WITH CORONARY ANGIOGRAM N/A 11/23/2011   Procedure: LEFT HEART CATHETERIZATION WITH CORONARY ANGIOGRAM;  Surgeon: Leonie Man, MD;  Location: [facility] CATH LAB;  Service: Cardiovascular;  Laterality: N/A;   TEE WITHOUT CARDIOVERSION N/A 01/25/2021   Procedure: TRANSESOPHAGEAL ECHOCARDIOGRAM (TEE);  Surgeon: Lajuana Matte, MD;  Location: [facility];  Service: Open Heart Surgery;  Laterality: N/A;   Family Status  Relation Name Status   Sister linda Alive   Brother Ulice Dash Alive   Father  Deceased   Mother  Deceased   Sister  Alive   Brother  Deceased   Son  Alive   Daughter  Alive   Family History  Problem Relation Age of Onset   Heart disease Sister    Heart attack Brother    Emphysema Father        smoked   Aneurysm Father    Social History   Socioeconomic History   Marital status: Divorced    Spouse name: Not on file   Number of children: Not on file   Years of education: Not on file   Highest education level: Not on file  Occupational History   Not on file  Tobacco Use   Smoking status: Former    Packs/day: 1.00    Years: 40.00    Pack years: 40.00    Types: Cigarettes    Quit date: 06/10/2016    Years since quitting: 5.0   Smokeless tobacco: Never   Tobacco comments:    02/18/14- smokes occ cig maybe 3 x per wk  Vaping Use   Vaping Use: Never used  Substance and Sexual Activity   Alcohol use: Yes    Alcohol/week: 0.0 standard drinks    Comment: social 2-3 drink a week   Drug use: No   Sexual activity: Not Currently  Other Topics Concern   Not on file  Social History Narrative   He is a 71 y.o. divorced father of 42, grandfather 1.   He is a  retired Radiation protection practitioner, former Museum/gallery conservator. 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   He quit smoking in August 2013, after smoking a pack a day for roughly 40 years.   He drinks socially 2-3 drinks a week only.   He does not get routine exercise, mostly due to 2 fatigue and dyspnea. Otherwise been relatively sedentary.   Social Determinants of Radio broadcast assistant Strain: Not on file  Food Insecurity: No Food Insecurity   Worried About [place] in the Last Year:  Never true   Ran Out of Food in the Last Year: Never true  Transportation Needs: No Transportation Needs   Lack of Transportation (Medical): No   Lack of Transportation (Non-Medical): No  Physical Activity: Not on file  Stress: Not on file  Social Connections: Not on file   Outpatient Medications Prior to Visit  Medication Sig   acetaminophen (TYLENOL) 500 MG tablet Take 1,500-2,000 mg by mouth daily.   albuterol (PROVENTIL) (2.5 MG/3ML) 0.083% nebulizer solution Take 3 mLs by nebulization every 4 (four) hours as needed.   albuterol (VENTOLIN HFA) 108 (90 Base) MCG/ACT inhaler Inhale 2 puffs into the lungs every 4 (four) hours as needed.   allopurinol (ZYLOPRIM) 100 MG tablet Take 1 tablet (100 mg total) by mouth daily. (Patient not taking: Reported on 05/19/2021)   amLODipine (NORVASC) 5 MG tablet Take 1 tablet (5 mg total) by mouth daily.   aspirin 81 MG EC tablet Take 1 tablet (81 mg total) by mouth daily.   benzonatate (TESSALON PERLES) 100 MG capsule Take 1 capsule (100 mg total) by mouth 3 (three) times daily as needed for cough.   clopidogrel (PLAVIX) 75 MG tablet Take 1 tablet (75 mg total) by mouth daily.   furosemide (LASIX) 20 MG tablet Take 1 tablet (20 mg total) by mouth daily.   glucose blood (ONETOUCH VERIO) test strip Use to check blood glucose 3 times daily   insulin detemir (LEVEMIR FLEXTOUCH) 100 UNIT/ML  FlexPen Inject 40 Units into the skin daily.   insulin lispro (HUMALOG) 100 UNIT/ML KwikPen Inject 10 Units into the skin every morning AND 6 Units daily with lunch AND 6 Units daily with supper. (Patient taking differently: Inject 14 Units into the skin every morning AND 14 Units daily with lunch AND 14Units daily with supper.)   Insulin Pen Needle 32G X 4 MM MISC Use daily with insulin as directed   menthol-cetylpyridinium (CEPACOL) 3 MG lozenge Take 1 lozenge (3 mg total) by mouth as needed for sore throat.   metoprolol tartrate (LOPRESSOR) 25 MG tablet Take 1 tablet (25 mg total) by mouth 2 (two) times daily.   nitroGLYCERIN (NITROSTAT) 0.4 MG SL tablet one tablet under tongue, q5 minutes for chest pain   pantoprazole (PROTONIX) 40 MG tablet Take 1 tablet (40 mg total) by mouth daily. (Patient not taking: Reported on 05/19/2021)   polyethylene glycol (MIRALAX / GLYCOLAX) 17 g packet Take 17 g by mouth daily as needed for mild constipation.   rosuvastatin (CRESTOR) 20 MG tablet Take 1 tablet (20 mg total) by mouth daily.   tamsulosin (FLOMAX) 0.4 MG CAPS capsule Take 1 capsule (0.4 mg total) by mouth daily. (Patient not taking: Reported on 05/19/2021)   Tetrahydrozoline HCl (VISINE EXTRA OP) Place 1 drop into both eyes as needed (itching).   traMADol (ULTRAM) 50 MG tablet Take 1 tablet (50 mg total) by mouth every 6 (six) hours as needed.   Vitamin D, Ergocalciferol, (DRISDOL) 1.25 MG (50000 UNIT) CAPS capsule Take 1 capsule (50,000 Units total) by mouth once a week. (Patient not taking: Reported on 05/19/2021)   zolpidem (AMBIEN) 10 MG tablet Take 1 tablet (10 mg total) by mouth at bedtime as needed for sleep   [DISCONTINUED] albuterol (PROVENTIL) (2.5 MG/3ML) 0.083% nebulizer solution Take 2.5 mg by nebulization every 4 (four) hours as needed for wheezing or shortness of breath.   [DISCONTINUED] albuterol (VENTOLIN HFA) 108 (90 Base) MCG/ACT inhaler Inhale 2 puffs into the lungs every 4 (four) hours as  needed for wheezing or shortness of breath.   [DISCONTINUED] aspirin EC 81 MG EC tablet Take 1 tablet (81 mg total) by mouth daily. Swallow whole.   [DISCONTINUED] clopidogrel (PLAVIX) 75 MG tablet Take 1 tablet (75 mg total) by mouth daily.   [DISCONTINUED] gabapentin (NEURONTIN) 300 MG capsule Take 600 mg by mouth 2 (two) times daily.   [DISCONTINUED] gabapentin (NEURONTIN) 300 MG capsule Take 2 capsules (600 mg total) by mouth 2 (two) times daily.   [DISCONTINUED] insulin aspart (NOVOLOG) 100 UNIT/ML injection Inject 0-24 Units into the skin 3 (three) times daily with meals. (Patient not taking: Reported on 05/19/2021)   [DISCONTINUED] insulin aspart (NOVOLOG) 100 UNIT/ML injection Inject 2-14 Units into the skin 3 (three) times daily with meals.   [DISCONTINUED] Insulin Glargine (BASAGLAR KWIKPEN) 100 UNIT/ML Inject 20 Units into the skin daily. (Patient not taking: Reported on 05/27/2021)   [DISCONTINUED] loratadine (CLARITIN) 10 MG tablet Take 1 tablet (10 mg total) by mouth daily. (Patient not taking: Reported on 05/19/2021)   [DISCONTINUED] metFORMIN (GLUCOPHAGE-XR) 500 MG 24 hr tablet Take 2 tablets (1,000 mg total) by mouth 2 (two) times daily for diabetes.   [DISCONTINUED] omeprazole (PRILOSEC) 40 MG capsule Take 1 capsule (40 mg total) by mouth daily 30 minutes before breakfast.   [DISCONTINUED] rosuvastatin (CRESTOR) 20 MG tablet Take 1 tablet (20 mg total) by mouth daily.   [DISCONTINUED] umeclidinium-vilanterol (ANORO ELLIPTA) 62.5-25 MCG/ACT AEPB Inhale 1 puff into the lungs daily as directed   [DISCONTINUED] umeclidinium-vilanterol (ANORO ELLIPTA) 62.5-25 MCG/INH AEPB Inhale 1 puff into the lungs daily.   [DISCONTINUED] zolpidem (AMBIEN) 10 MG tablet Take 10 mg by mouth at bedtime.   No facility-administered medications prior to visit.   Allergies  Allergen Reactions   Morphine And Related Itching    Possible itching due to morphine 01/26/21   Other Other (See Comments)    Cats-  eyes burn   Immunization History  Administered Date(s) Administered   Fluad Quad(high Dose 65+) 02/23/2020   Influenza Split 01/05/2011, 01/08/2013, 01/15/2014, 02/09/2014   Influenza,inj,Quad PF,6+ Mos 02/17/2015   Influenza-Unspecified 02/08/2016, 01/15/2017, 03/10/2018   PFIZER(Purple Top)SARS-COV-2 Vaccination 06/19/2019, 07/16/2019   Pneumococcal Polysaccharide-23 05/13/2010    Health Maintenance Due: Health Maintenance  Topic Date Due   FOOT EXAM  Never done   OPHTHALMOLOGY EXAM  Never done   URINE MICROALBUMIN  Never done   Hepatitis C Screening  Never done   TETANUS/TDAP  Never done   COLONOSCOPY (Pts 45-53yrs Insurance coverage will need to be confirmed)  Never done   Zoster Vaccines- Shingrix (1 of 2) Never done   Pneumonia Vaccine 46+ Years old (2 - PCV) 05/14/2011   COVID-19 Vaccine (3 - Booster for [place] series) 09/10/2019   INFLUENZA VACCINE  07/15/2021 (Originally 11/15/2020)   HEMOGLOBIN A1C  07/22/2021   HPV VACCINES  Aged Out    Review of Systems All review of systems negative except what is listed in the HPI   Objective    BP 128/72    Pulse 72    Ht 5\' 5"  (1.651 m)    Wt 184 lb (83.5 kg)    SpO2 98%    BMI 30.62 kg/m  Physical Exam Vitals and nursing note reviewed.  Constitutional:      Appearance: Normal appearance.  HENT:     Head: Normocephalic.  Eyes:     Extraocular Movements: Extraocular movements intact.     Conjunctiva/sclera: Conjunctivae normal.     Pupils: Pupils are equal, round,  and reactive to light.  Neck:     Vascular: No carotid bruit.  Cardiovascular:     Rate and Rhythm: Normal rate and regular rhythm.     Pulses: Normal pulses.     Heart sounds: Normal heart sounds. No murmur heard. Pulmonary:     Effort: Pulmonary effort is normal.     Breath sounds: Wheezing present.  Chest:     Chest wall: Tenderness present.  Abdominal:     General: Abdomen is flat. Bowel sounds are normal. There is no distension.     Palpations:  Abdomen is soft.     Tenderness: There is no abdominal tenderness. There is no right CVA tenderness, left CVA tenderness or guarding.  Musculoskeletal:        General: Normal range of motion.     Cervical back: Normal range of motion.     Right lower leg: No edema.     Left lower leg: No edema.  Lymphadenopathy:     Cervical: No cervical adenopathy.  Skin:    General: Skin is warm and dry.     Capillary Refill: Capillary refill takes less than 2 seconds.  Neurological:     General: No focal deficit present.     Mental Status: He is alert and oriented to person, place, and time.     Cranial Nerves: No cranial nerve deficit.     Sensory: Sensory deficit present.     Motor: Weakness present.     Gait: Gait abnormal.  Psychiatric:        Mood and Affect: Mood normal.        Behavior: Behavior normal.        Thought Content: Thought content normal.        Judgment: Judgment normal.    No results found for any visits on 06/13/21.  Assessment & Plan      Problem List Items Addressed This Visit     Hyperlipidemia with target LDL less than 70 - Primary (Chronic)    Chronic. Labs today.       Relevant Orders   CBC with Differential/Platelet   Comprehensive metabolic panel   Lipid panel   Hemoglobin A1c   TSH   VITAMIN D 25 Hydroxy (Vit-D Deficiency, Fractures)   Chronic obstructive pulmonary disease, unspecified (HCC)    Chronic. Wheezing present today.  Will send refills on inhalers for management.  No currently followed by pulmonology, but this would be beneficial to patients overall health.  Will discuss in future visits.       Relevant Medications   albuterol (VENTOLIN HFA) 108 (90 Base) MCG/ACT inhaler   umeclidinium-vilanterol (ANORO ELLIPTA) 62.5-25 MCG/ACT AEPB   albuterol (PROVENTIL) (2.5 MG/3ML) 0.083% nebulizer solution   Other Relevant Orders   CBC with Differential/Platelet   Comprehensive metabolic panel   Lipid panel   Hemoglobin A1c   TSH   VITAMIN D  25 Hydroxy (Vit-D Deficiency, Fractures)   DOE (dyspnea on exertion)    See COPD      Relevant Medications   albuterol (VENTOLIN HFA) 108 (90 Base) MCG/ACT inhaler   umeclidinium-vilanterol (ANORO ELLIPTA) 62.5-25 MCG/ACT AEPB   Other Relevant Orders   CBC with Differential/Platelet   Comprehensive metabolic panel   Lipid panel   Hemoglobin A1c   TSH   VITAMIN D 25 Hydroxy (Vit-D Deficiency, Fractures)   OSA and COPD overlap syndrome (HCC)    Chronic. No CPAP.  Patient would benefit from pulmonology evaluation for management and recommendations.  Relevant Medications   albuterol (VENTOLIN HFA) 108 (90 Base) MCG/ACT inhaler   umeclidinium-vilanterol (ANORO ELLIPTA) 62.5-25 MCG/ACT AEPB   albuterol (PROVENTIL) (2.5 MG/3ML) 0.083% nebulizer solution   Other Relevant Orders   CBC with Differential/Platelet   Comprehensive metabolic panel   Lipid panel   Hemoglobin A1c   TSH   VITAMIN D 25 Hydroxy (Vit-D Deficiency, Fractures)   Poorly controlled type 2 diabetes mellitus with peripheral neuropathy (HCC)    Chronic. Currently on TID insulin, qHS insulin, and metformin.  Diet not well managed at this time. I do feel that we can find significant improvement with further education.  Will review records and consider GLP-1 and Farxiga given co-morbidities to replace insulin. He may also benefit from CGM to help with control and understanding of how food impacts his BG levels.       Relevant Medications   gabapentin (NEURONTIN) 300 MG capsule   metFORMIN (GLUCOPHAGE-XR) 500 MG 24 hr tablet   Other Relevant Orders   CBC with Differential/Platelet   Comprehensive metabolic panel   Lipid panel   Hemoglobin A1c   TSH   VITAMIN D 25 Hydroxy (Vit-D Deficiency, Fractures)   Primary osteoarthritis, left shoulder    Chronic. Taking gabapentin for pain, but not well controlled at this time.  Recent articular injections caused BG elevation.  Consider referral to orthopedics for  further evaluation.       Relevant Orders   CBC with Differential/Platelet   Comprehensive metabolic panel   Lipid panel   Hemoglobin A1c   TSH   VITAMIN D 25 Hydroxy (Vit-D Deficiency, Fractures)   Chronic kidney disease, stage 3a (HCC)    Chronic. Consider adding Farxiga to medication regimen and adding GLP-1 for improved control while D/C insulin with meals. Will review labs and previous records and plan to discuss this at future visits.       Relevant Orders   CBC with Differential/Platelet   Comprehensive metabolic panel   Lipid panel   Hemoglobin A1c   TSH   VITAMIN D 25 Hydroxy (Vit-D Deficiency, Fractures)   Essential hypertension    Chronic. Well controlled today.  No concerning findings present. Will continue to collaborate with cardiology for management.       Relevant Orders   CBC with Differential/Platelet   Comprehensive metabolic panel   Lipid panel   Hemoglobin A1c   TSH   VITAMIN D 25 Hydroxy (Vit-D Deficiency, Fractures)   Peripheral vascular disease (HCC)    Chronic. No edema present today.  Recommend elevation of lower extremities when seating and wearing compression stockings during the day to help with blood flow and to reduce edema.  Will continue to collaborate with cardiology.       Relevant Orders   CBC with Differential/Platelet   Comprehensive metabolic panel   Lipid panel   Hemoglobin A1c   TSH   VITAMIN D 25 Hydroxy (Vit-D Deficiency, Fractures)   Vitamin D deficiency    Labs today      Relevant Orders   CBC with Differential/Platelet   Comprehensive metabolic panel   Lipid panel   Hemoglobin A1c   TSH   VITAMIN D 25 Hydroxy (Vit-D Deficiency, Fractures)   Other Visit Diagnoses     Type 2 diabetes mellitus with diabetic neuropathy, with long-term current use of insulin (HCC)       Relevant Medications   gabapentin (NEURONTIN) 300 MG capsule   metFORMIN (GLUCOPHAGE-XR) 500 MG 24 hr tablet     Plan  to review labs and make  changes to plan of care as necessary based on findings. Review of medical history also warranted given complex history prior to making changes to plan of care.    Return in about 2 months (around 08/11/2021) for Chronic disease overview 41min.     Izac Faulkenberry, [name], NP, DNP, AGNP-C Primary Care & Sports Medicine at [place]

## 2021-06-13 NOTE — Assessment & Plan Note (Signed)
Chronic. No edema present today.  Recommend elevation of lower extremities when seating and wearing compression stockings during the day to help with blood flow and to reduce edema.  Will continue to collaborate with cardiology.

## 2021-06-13 NOTE — Assessment & Plan Note (Signed)
Chronic. Well controlled today.  No concerning findings present. Will continue to collaborate with cardiology for management.

## 2021-06-14 ENCOUNTER — Other Ambulatory Visit (HOSPITAL_COMMUNITY): Payer: Self-pay

## 2021-06-14 DIAGNOSIS — G4733 Obstructive sleep apnea (adult) (pediatric): Secondary | ICD-10-CM | POA: Diagnosis not present

## 2021-06-14 DIAGNOSIS — E1142 Type 2 diabetes mellitus with diabetic polyneuropathy: Secondary | ICD-10-CM | POA: Diagnosis not present

## 2021-06-14 DIAGNOSIS — I739 Peripheral vascular disease, unspecified: Secondary | ICD-10-CM | POA: Diagnosis not present

## 2021-06-14 DIAGNOSIS — N1831 Chronic kidney disease, stage 3a: Secondary | ICD-10-CM | POA: Diagnosis not present

## 2021-06-14 DIAGNOSIS — E559 Vitamin D deficiency, unspecified: Secondary | ICD-10-CM | POA: Diagnosis not present

## 2021-06-14 DIAGNOSIS — E1165 Type 2 diabetes mellitus with hyperglycemia: Secondary | ICD-10-CM | POA: Diagnosis not present

## 2021-06-14 DIAGNOSIS — J449 Chronic obstructive pulmonary disease, unspecified: Secondary | ICD-10-CM | POA: Diagnosis not present

## 2021-06-14 DIAGNOSIS — M19012 Primary osteoarthritis, left shoulder: Secondary | ICD-10-CM | POA: Diagnosis not present

## 2021-06-14 DIAGNOSIS — R0609 Other forms of dyspnea: Secondary | ICD-10-CM | POA: Diagnosis not present

## 2021-06-14 DIAGNOSIS — I10 Essential (primary) hypertension: Secondary | ICD-10-CM | POA: Diagnosis not present

## 2021-06-14 DIAGNOSIS — E785 Hyperlipidemia, unspecified: Secondary | ICD-10-CM | POA: Diagnosis not present

## 2021-06-15 LAB — COMPREHENSIVE METABOLIC PANEL
ALT: 19 IU/L (ref 0–44)
AST: 25 IU/L (ref 0–40)
Albumin/Globulin Ratio: 1.5 (ref 1.2–2.2)
Albumin: 4.4 g/dL (ref 3.7–4.7)
Alkaline Phosphatase: 122 IU/L — ABNORMAL HIGH (ref 44–121)
BUN/Creatinine Ratio: 15 (ref 10–24)
BUN: 15 mg/dL (ref 8–27)
Bilirubin Total: 0.4 mg/dL (ref 0.0–1.2)
CO2: 20 mmol/L (ref 20–29)
Calcium: 9.5 mg/dL (ref 8.6–10.2)
Chloride: 105 mmol/L (ref 96–106)
Creatinine, Ser: 0.97 mg/dL (ref 0.76–1.27)
Globulin, Total: 3 g/dL (ref 1.5–4.5)
Glucose: 171 mg/dL — ABNORMAL HIGH (ref 70–99)
Potassium: 4.4 mmol/L (ref 3.5–5.2)
Sodium: 141 mmol/L (ref 134–144)
Total Protein: 7.4 g/dL (ref 6.0–8.5)
eGFR: 83 mL/min/{1.73_m2} (ref >59)

## 2021-06-15 LAB — CBC WITH DIFFERENTIAL/PLATELET
Basophils Absolute: 0 10*3/uL (ref 0.0–0.2)
Basos: 1 %
EOS (ABSOLUTE): 0.3 10*3/uL (ref 0.0–0.4)
Eos: 4 %
Hematocrit: 34.4 % — ABNORMAL LOW (ref 37.5–51.0)
Hemoglobin: 10.6 g/dL — ABNORMAL LOW (ref 13.0–17.7)
Immature Grans (Abs): 0 10*3/uL (ref 0.0–0.1)
Immature Granulocytes: 0 %
Lymphocytes Absolute: 2 10*3/uL (ref 0.7–3.1)
Lymphs: 29 %
MCH: 24.7 pg — ABNORMAL LOW (ref 26.6–33.0)
MCHC: 30.8 g/dL — ABNORMAL LOW (ref 31.5–35.7)
MCV: 80 fL (ref 79–97)
Monocytes Absolute: 0.5 10*3/uL (ref 0.1–0.9)
Monocytes: 7 %
Neutrophils Absolute: 4 10*3/uL (ref 1.4–7.0)
Neutrophils: 59 %
Platelets: 172 10*3/uL (ref 150–450)
RBC: 4.3 x10E6/uL (ref 4.14–5.80)
RDW: 15.7 % — ABNORMAL HIGH (ref 11.6–15.4)
WBC: 6.8 10*3/uL (ref 3.4–10.8)

## 2021-06-15 LAB — HEMOGLOBIN A1C
Est. average glucose Bld gHb Est-mCnc: 180 mg/dL
Hgb A1c MFr Bld: 7.9 % — ABNORMAL HIGH (ref 4.8–5.6)

## 2021-06-15 LAB — VITAMIN D 25 HYDROXY (VIT D DEFICIENCY, FRACTURES): Vit D, 25-Hydroxy: 64.3 ng/mL (ref 30.0–100.0)

## 2021-06-15 LAB — LIPID PANEL
Chol/HDL Ratio: 3.3 ratio (ref 0.0–5.0)
Cholesterol, Total: 70 mg/dL — ABNORMAL LOW (ref 100–199)
HDL: 21 mg/dL — ABNORMAL LOW (ref >39)
LDL Chol Calc (NIH): 23 mg/dL (ref 0–99)
Triglycerides: 149 mg/dL (ref 0–149)
VLDL Cholesterol Cal: 26 mg/dL (ref 5–40)

## 2021-06-15 LAB — TSH: TSH: 1.61 u[IU]/mL (ref 0.450–4.500)

## 2021-06-16 ENCOUNTER — Other Ambulatory Visit (HOSPITAL_COMMUNITY): Payer: Self-pay

## 2021-06-17 ENCOUNTER — Other Ambulatory Visit: Payer: Self-pay | Admitting: *Deleted

## 2021-06-17 NOTE — Patient Outreach (Signed)
[facility]) Care Management ? ?06/17/2021 ? ?Paul [name] ?October 18, 1950 ?[id number] ? ? ?Telephone Assessment-Unsuccessful ? ?RN attempted outreach call today however unsuccessful. RN able to leave a HIPAA approved voice message requesting a call back. ? ?Will follow up once again over the next week for ongoing [facility] services.  ? ?[name], RN ?Care Management Coordinator ?[facility] ?Main Office [phone]  ?

## 2021-06-21 ENCOUNTER — Other Ambulatory Visit: Payer: Self-pay | Admitting: *Deleted

## 2021-06-21 NOTE — Patient Outreach (Signed)
[facility]) Care Management ? ?06/21/2021 ? ?[name] ?[birth year] ?[id number] ? ? ?Telephone Assessment-Unsuccessful ? ?RN attempted outreach call however only able to leave a HIPAA approved voice message requesting a call back. ? ?Will attempt another outreach call over the next week for ongoing [facility] services. Will also send outreach letter. ? ?[name], RN ?Care Management Coordinator ?[facility] ?Main Office [phone]  ?

## 2021-06-22 ENCOUNTER — Other Ambulatory Visit (HOSPITAL_COMMUNITY): Payer: Self-pay

## 2021-06-23 ENCOUNTER — Other Ambulatory Visit (HOSPITAL_COMMUNITY): Payer: Self-pay

## 2021-06-23 ENCOUNTER — Other Ambulatory Visit (HOSPITAL_BASED_OUTPATIENT_CLINIC_OR_DEPARTMENT_OTHER): Payer: Self-pay | Admitting: Nurse Practitioner

## 2021-06-23 DIAGNOSIS — I25118 Atherosclerotic heart disease of native coronary artery with other forms of angina pectoris: Secondary | ICD-10-CM

## 2021-06-23 DIAGNOSIS — E785 Hyperlipidemia, unspecified: Secondary | ICD-10-CM

## 2021-06-23 MED ORDER — ROSUVASTATIN CALCIUM 20 MG PO TABS
10.0000 mg | ORAL_TABLET | Freq: Every day | ORAL | 2 refills | Status: DC
  Filled 2021-06-23: qty 15, 30d supply, fill #0

## 2021-06-27 DIAGNOSIS — Z20822 Contact with and (suspected) exposure to covid-19: Secondary | ICD-10-CM | POA: Diagnosis not present

## 2021-06-28 ENCOUNTER — Other Ambulatory Visit (HOSPITAL_COMMUNITY): Payer: Self-pay

## 2021-06-28 ENCOUNTER — Other Ambulatory Visit: Payer: Self-pay | Admitting: *Deleted

## 2021-06-28 NOTE — Patient Outreach (Signed)
?[facility]) Care Management ?Telephonic RN Care Manager Note ? ? ?06/28/2021 ?Name:  Paul [name] MRN:  [id number] DOB:  14-Jul-1950 ? ?Summary: ?Reviewed and discussed the plan of care and documented all updated within the plan of care. Recent A1C 7.9 in Feb. Continue to encouraged adherence to the discussed plan of care.  ? ?Recommendations/Changes made from today's visit: ?Continue to stress adherence with mobility and ROM to assist with ongoing arthritis and to continue daily glucose check by obtain the needed strips from his local pharmacy.  ? ?Subjective: ?Paul [name] is an 71 y.o. year old male who is a primary patient of Early, [name], NP. The care management team was consulted for assistance with care management and/or care coordination needs.   ? ?Telephonic RN Care Manager completed Telephone Visit today. ? ?Objective:  ? ?Medications Reviewed Today   ? ? Reviewed by Tobi Bastos, RN (Registered Nurse) on 06/28/21 at 1033  Med List Status: <None>  ? ?Medication Order Taking? Sig Documenting Provider Last Dose Status Informant  ?acetaminophen (TYLENOL) 500 MG tablet [id number] Yes Take 1,500-2,000 mg by mouth daily. [provider] Taking Active Self  ?albuterol (PROVENTIL) (2.5 MG/3ML) 0.083% nebulizer solution [id number]  Take 3 mLs by nebulization every 4 (four) hours as needed.   Active   ?albuterol (PROVENTIL) (2.5 MG/3ML) 0.083% nebulizer solution [id number] Yes Take 3 mLs (2.5 mg total) by nebulization every 4 (four) hours as needed for wheezing or shortness of breath. Orma Render, NP Taking Active   ?albuterol (VENTOLIN HFA) 108 (90 Base) MCG/ACT inhaler [id number] Yes Inhale 2 puffs into the lungs every 4 (four) hours as needed.  Taking Active   ?albuterol (VENTOLIN HFA) 108 (90 Base) MCG/ACT inhaler [id number]  Inhale 2 puffs into the lungs every 4 (four) hours as needed. For breathing. Orma Render, NP  Active   ?allopurinol (ZYLOPRIM) 100 MG tablet [id number] Yes Take  1 tablet (100 mg total) by mouth daily.  Taking Active   ?amLODipine (NORVASC) 5 MG tablet [id number] Yes Take 1 tablet (5 mg total) by mouth daily.  Taking Active   ?aspirin 81 MG EC tablet [id number] Yes Take 1 tablet (81 mg total) by mouth daily.  Taking Active   ?benzonatate (TESSALON PERLES) 100 MG capsule [id number] Yes Take 1 capsule (100 mg total) by mouth 3 (three) times daily as needed for cough. Loel Dubonnet, NP Taking Active   ?clopidogrel (PLAVIX) 75 MG tablet [id number] Yes Take 1 tablet (75 mg total) by mouth daily.  Taking Active   ?furosemide (LASIX) 20 MG tablet [id number] Yes Take 1 tablet (20 mg total) by mouth daily.  Taking Active   ?gabapentin (NEURONTIN) 300 MG capsule [id number] Yes Take 2 capsules (600 mg total) by mouth 2 (two) times daily. For back and foot pain. Orma Render, NP Taking Active   ?glucose blood (ONETOUCH VERIO) test strip [id number] Yes Use to check blood glucose 3 times daily  Taking Active   ?insulin detemir (LEVEMIR FLEXTOUCH) 100 UNIT/ML FlexPen [id number] Yes Inject 40 Units into the skin daily.  Taking Active   ?insulin lispro (HUMALOG) 100 UNIT/ML KwikPen [id number] Yes Inject 10 Units into the skin every morning AND 6 Units daily with lunch AND 6 Units daily with supper.  ?Patient taking differently: Inject 14 Units into the skin every morning AND 14 Units daily with lunch AND 14Units daily with supper.  ?  Taking Active   ?Insulin Pen Needle 32G X 4 MM MISC  [id number] Yes Use daily with insulin as directed  Taking Active   ?menthol-cetylpyridinium (CEPACOL) 3 MG lozenge [id number] Yes Take 1 lozenge (3 mg total) by mouth as needed for sore throat. Elgie Collard, PA-C Taking Active   ?metFORMIN (GLUCOPHAGE-XR) 500 MG 24 hr tablet [id number] Yes Take 2 tablets (1,000 mg total) by mouth 2 (two) times daily. For Diabetes Early, [name], NP Taking Active   ?metoprolol tartrate (LOPRESSOR) 25 MG tablet [id number] Yes Take 1 tablet (25 mg total) by mouth 2 (two) times daily.  Loel Dubonnet, NP Taking Active   ?nitroGLYCERIN (NITROSTAT) 0.4 MG SL tablet [id number] Yes one tablet under tongue, q5 minutes for chest pain [provider] Taking Active   ?pantoprazole (PROTONIX) 40 MG tablet [id number] Yes Take 1 tablet (40 mg total) by mouth daily.  Taking Active   ?polyethylene glycol (MIRALAX / GLYCOLAX) 17 g packet [id number] Yes Take 17 g by mouth daily as needed for mild constipation. [provider] Taking Active Self  ?rosuvastatin (CRESTOR) 20 MG tablet [id number] Yes Take 0.5 tablets (10 mg total) by mouth daily. Or you may take 1 tablet ('20mg'$ ) every other day if the pill is too small to split. Orma Render, NP Taking Active   ?tamsulosin (FLOMAX) 0.4 MG CAPS capsule [id number] No Take 1 capsule (0.4 mg total) by mouth daily.  ?Patient not taking: Reported on 05/19/2021  ? Elgie Collard, PA-C Not Taking Active   ?Tetrahydrozoline HCl (VISINE EXTRA OP) [id number] Yes Place 1 drop into both eyes as needed (itching). [provider] Taking Active Self  ?traMADol (ULTRAM) 50 MG tablet [id number] Yes Take 1 tablet (50 mg total) by mouth every 6 (six) hours as needed. Nani Skillern, PA-C Taking Active   ?umeclidinium-vilanterol [facility] ELLIPTA) 62.5-25 MCG/ACT AEPB [id number] Yes Inhale 1 puff into the lungs daily. For breathing Early, [name], NP Taking Active   ?Vitamin D, Ergocalciferol, (DRISDOL) 1.25 MG (50000 UNIT) CAPS capsule [id number] No Take 1 capsule (50,000 Units total) by mouth once a week.  ?Patient not taking: Reported on 05/19/2021  ?  Not Taking Active   ?zolpidem (AMBIEN) 10 MG tablet [id number] Yes Take 1 tablet (10 mg total) by mouth at bedtime as needed for sleep  Taking Active   ? ?  ?  ? ?  ? ? ? ?SDOH:  (Social Determinants of Health) assessments and interventions performed:  ? ? ? ?Care Plan ? ?Review of patient past medical history, allergies, medications, health status, including review of consultants reports, laboratory and other test  data, was performed as part of comprehensive evaluation for care management services.  ? ?Care Plan : RN Care Manager Plan of Care  ?Updates made by Tobi Bastos, RN since 06/28/2021 12:00 AM  ?  ? ?Problem: Knowledge Deficit related to Diabetes and Care Coordination Needs   ?Priority: High  ?  ? ?Long-Range Goal: Development plan of care for management of Diabetes   ?Start Date: 03/07/2021  ?Expected End Date: 11/14/2021  ?This Visit's Progress: On track  ?Recent Progress: On track  ?Priority: High  ?Note:   ?Current Barriers:  ?Knowledge Deficits related to plan of care for management of DMII ?Chronic Disease Management support and education needs related to DMII ? ?RNCM Clinical Goal(s):  ?Patient will verbalize understanding of plan for management of DMII ?verbalize basic understanding of  DMII disease process and self health management plan utilizing the discussed plan of care and information provided today. ?take all medications  exactly as prescribed and will call provider for medication related questions ?attend all scheduled medical appointments: Continuation of monitoring blood sugars and adhering to diabetic diet . ?continue to work with RN Care Manager to address care management and care coordination needs related to  DMII through collaboration with RN Care manager, provider, and care team.  ? ?Interventions: ?Inter-disciplinary care team collaboration (see longitudinal plan of care) ?Evaluation of current treatment plan related to  self management and patient's adherence to plan as established by provider ? ?Diabetes Interventions: ?Assessed patient's understanding of A1c goal: <6.5% ?Provided education to patient about basic DM disease process; ?Counseled on importance of regular laboratory monitoring as prescribed; ?Advised patient, providing education and rationale, to check cbg TID and record, calling primary provider for findings outside established parameters; ?Lab Results  ?Component Value Date   ? HGBA1C 8.8 (H) 01/21/2021  ?Update 12/20:Pt reports his A1C should be lower with the recent changes in he dietary habits. Pt states a recent visit with his primary provider to concurs with the expecta

## 2021-07-05 ENCOUNTER — Telehealth (HOSPITAL_BASED_OUTPATIENT_CLINIC_OR_DEPARTMENT_OTHER): Payer: Self-pay

## 2021-07-05 ENCOUNTER — Other Ambulatory Visit (HOSPITAL_COMMUNITY): Payer: Self-pay

## 2021-07-05 ENCOUNTER — Other Ambulatory Visit (HOSPITAL_BASED_OUTPATIENT_CLINIC_OR_DEPARTMENT_OTHER): Payer: Self-pay | Admitting: Nurse Practitioner

## 2021-07-05 DIAGNOSIS — F5104 Psychophysiologic insomnia: Secondary | ICD-10-CM

## 2021-07-05 MED ORDER — CLOPIDOGREL BISULFATE 75 MG PO TABS
75.0000 mg | ORAL_TABLET | Freq: Every day | ORAL | 0 refills | Status: DC
  Filled 2021-07-05: qty 30, 30d supply, fill #0

## 2021-07-05 MED ORDER — ZOLPIDEM TARTRATE 10 MG PO TABS
10.0000 mg | ORAL_TABLET | Freq: Every evening | ORAL | 0 refills | Status: DC | PRN
Start: 2021-07-05 — End: 2021-07-05

## 2021-07-05 MED ORDER — ZOLPIDEM TARTRATE 10 MG PO TABS
10.0000 mg | ORAL_TABLET | Freq: Every evening | ORAL | 0 refills | Status: DC | PRN
Start: 2021-07-05 — End: 2021-08-15
  Filled 2021-07-05: qty 30, 30d supply, fill #0

## 2021-07-06 ENCOUNTER — Other Ambulatory Visit (HOSPITAL_BASED_OUTPATIENT_CLINIC_OR_DEPARTMENT_OTHER): Payer: Self-pay

## 2021-07-06 ENCOUNTER — Other Ambulatory Visit (HOSPITAL_COMMUNITY): Payer: Self-pay

## 2021-07-08 DIAGNOSIS — Z20822 Contact with and (suspected) exposure to covid-19: Secondary | ICD-10-CM | POA: Diagnosis not present

## 2021-07-11 ENCOUNTER — Other Ambulatory Visit (HOSPITAL_COMMUNITY): Payer: Self-pay

## 2021-07-12 ENCOUNTER — Telehealth: Payer: Self-pay | Admitting: Orthopaedic Surgery

## 2021-07-12 NOTE — Telephone Encounter (Signed)
Patient's care taker Stan Head advised patient has an appointment with [facility] 07/20/2021 at 8:30am with Dr. Davy Pique.    Helene Kelp advised [facility]  will request records on the patient. ?The number to contact Helene Kelp is  [phone] ?

## 2021-07-14 ENCOUNTER — Telehealth (HOSPITAL_BASED_OUTPATIENT_CLINIC_OR_DEPARTMENT_OTHER): Payer: Self-pay | Admitting: Nurse Practitioner

## 2021-07-14 DIAGNOSIS — E1165 Type 2 diabetes mellitus with hyperglycemia: Secondary | ICD-10-CM

## 2021-07-14 NOTE — Telephone Encounter (Signed)
Pt is needing Express scrips added to pharmacy their number is [phone] ? ?insulin detemir (LEVEMIR FLEXTOUCH) 100 UNIT/ML FlexPen [[id number]]  ? ?insulin lispro (HUMALOG) 100 UNIT/ML KwikPen [[id number]]  ? ?Pt will talk to insurance about getting a new pump or blood sugar machine due to pts being broken. Pt is almost out of these and it will take two days for express scrips to delivered after approval is given.  ? ?Please advise. ?

## 2021-07-15 MED ORDER — HUMALOG KWIKPEN 200 UNIT/ML ~~LOC~~ SOPN: PEN_INJECTOR | SUBCUTANEOUS | 3 refills | Status: DC

## 2021-07-15 MED ORDER — LEVEMIR FLEXTOUCH 100 UNIT/ML ~~LOC~~ SOPN: 40.0000 [IU] | PEN_INJECTOR | Freq: Every day | SUBCUTANEOUS | 3 refills | Status: DC

## 2021-07-15 NOTE — Telephone Encounter (Signed)
Medication was sent to pharmacy on 3/21 ?

## 2021-07-15 NOTE — Telephone Encounter (Signed)
Scripts sent. Humalog only available in 200u/mL dosing so that was sent as replacement. No changes to dosing.  ?

## 2021-07-20 ENCOUNTER — Other Ambulatory Visit (HOSPITAL_COMMUNITY): Payer: Self-pay

## 2021-07-20 DIAGNOSIS — G894 Chronic pain syndrome: Secondary | ICD-10-CM | POA: Diagnosis not present

## 2021-07-20 DIAGNOSIS — M5416 Radiculopathy, lumbar region: Secondary | ICD-10-CM | POA: Diagnosis not present

## 2021-07-20 DIAGNOSIS — M48062 Spinal stenosis, lumbar region with neurogenic claudication: Secondary | ICD-10-CM | POA: Diagnosis not present

## 2021-07-20 DIAGNOSIS — M25512 Pain in left shoulder: Secondary | ICD-10-CM | POA: Diagnosis not present

## 2021-07-20 DIAGNOSIS — G8929 Other chronic pain: Secondary | ICD-10-CM | POA: Diagnosis not present

## 2021-07-20 DIAGNOSIS — Z6831 Body mass index (BMI) 31.0-31.9, adult: Secondary | ICD-10-CM | POA: Diagnosis not present

## 2021-07-20 MED ORDER — HYDROCODONE-ACETAMINOPHEN 5-325 MG PO TABS
1.0000 | ORAL_TABLET | Freq: Three times a day (TID) | ORAL | 0 refills | Status: DC | PRN
  Filled 2021-07-20: qty 21, 7d supply, fill #0
  Filled 2021-08-01 (×2): qty 21, 7d supply, fill #1

## 2021-07-29 ENCOUNTER — Other Ambulatory Visit: Payer: Self-pay | Admitting: *Deleted

## 2021-07-29 NOTE — Patient Outreach (Addendum)
[facility]) Care Management ? ?07/29/2021 ? ?Paul [name] ?Dec 20, 1950 ?[id number] ? ? ?Telephone Assessment-Unsuccessful ? ?RN attempted outreach call today however unsuccessful. RN able to leave a HIPAA approved  voice message requesting a call back.  ? ?Will outreach with another call over the next week for ongoing [facility] services. ? ?[name], RN ?Care Management Coordinator ?[place] ?Main Office [phone]  ?

## 2021-07-30 DIAGNOSIS — Z20822 Contact with and (suspected) exposure to covid-19: Secondary | ICD-10-CM | POA: Diagnosis not present

## 2021-08-01 ENCOUNTER — Other Ambulatory Visit (HOSPITAL_COMMUNITY): Payer: Self-pay

## 2021-08-01 ENCOUNTER — Other Ambulatory Visit (HOSPITAL_BASED_OUTPATIENT_CLINIC_OR_DEPARTMENT_OTHER): Payer: Self-pay | Admitting: Nurse Practitioner

## 2021-08-01 MED ORDER — CLOPIDOGREL BISULFATE 75 MG PO TABS
75.0000 mg | ORAL_TABLET | Freq: Every day | ORAL | 0 refills | Status: DC
  Filled 2021-08-01: qty 30, 30d supply, fill #0

## 2021-08-04 ENCOUNTER — Other Ambulatory Visit: Payer: Self-pay | Admitting: *Deleted

## 2021-08-05 ENCOUNTER — Other Ambulatory Visit (HOSPITAL_COMMUNITY): Payer: Self-pay

## 2021-08-05 DIAGNOSIS — Z20822 Contact with and (suspected) exposure to covid-19: Secondary | ICD-10-CM | POA: Diagnosis not present

## 2021-08-05 NOTE — Patient Outreach (Signed)
[facility]) Care Management Telephonic RN Care Manager Note   08/04/2021 Name:  Paul [name] MRN:  [id number] DOB:  1951/03/06  Summary: Pt continue to do well with no acute issues or needs since the last conversation. Pt pending a replacement CBG device but feels his readings are much improved.  Recommendations/Changes made from today's visit: Will continue to reiterate on the plan of care and encouraged adherence. Stress the importance of daily glucose monitoring and how diabetes can affect other medical conditions. Encouraged pt to obtain the glucose device soon from his provider as indicated. Pt aware to follow up with this RN case manager with barriers in obtaining this device.  Subjective: Paul [name] is an 71 y.o. year old male who is a primary patient of Early, Sung Amabile, NP. The care management team was consulted for assistance with care management and/or care coordination needs.    Telephonic RN Care Manager completed Telephone Visit today.  Objective:   Medications Reviewed Today     Reviewed by Alejandro Mulling, RN (Registered Nurse) on 06/28/21 at 1033  Med List Status: <None>   Medication Order Taking? Sig Documenting Provider Last Dose Status Informant  acetaminophen (TYLENOL) 500 MG tablet [id number] Yes Take 1,500-2,000 mg by mouth daily. [provider] Taking Active Self  albuterol (PROVENTIL) (2.5 MG/3ML) 0.083% nebulizer solution [id number]  Take 3 mLs by nebulization every 4 (four) hours as needed.   Active   albuterol (PROVENTIL) (2.5 MG/3ML) 0.083% nebulizer solution [id number] Yes Take 3 mLs (2.5 mg total) by nebulization every 4 (four) hours as needed for wheezing or shortness of breath. Tollie Eth, NP Taking Active   albuterol (VENTOLIN HFA) 108 (90 Base) MCG/ACT inhaler [id number] Yes Inhale 2 puffs into the lungs every 4 (four) hours as needed.  Taking Active   albuterol (VENTOLIN HFA) 108 (90 Base) MCG/ACT inhaler [id number]  Inhale 2  puffs into the lungs every 4 (four) hours as needed. For breathing. Tollie Eth, NP  Active   allopurinol (ZYLOPRIM) 100 MG tablet [id number] Yes Take 1 tablet (100 mg total) by mouth daily.  Taking Active   amLODipine (NORVASC) 5 MG tablet [id number] Yes Take 1 tablet (5 mg total) by mouth daily.  Taking Active   aspirin 81 MG EC tablet [id number] Yes Take 1 tablet (81 mg total) by mouth daily.  Taking Active   benzonatate (TESSALON PERLES) 100 MG capsule [id number] Yes Take 1 capsule (100 mg total) by mouth 3 (three) times daily as needed for cough. Alver Sorrow, NP Taking Active   clopidogrel (PLAVIX) 75 MG tablet [id number] Yes Take 1 tablet (75 mg total) by mouth daily.  Taking Active   furosemide (LASIX) 20 MG tablet [id number] Yes Take 1 tablet (20 mg total) by mouth daily.  Taking Active   gabapentin (NEURONTIN) 300 MG capsule [id number] Yes Take 2 capsules (600 mg total) by mouth 2 (two) times daily. For back and foot pain. Tollie Eth, NP Taking Active   glucose blood (ONETOUCH VERIO) test strip [id number] Yes Use to check blood glucose 3 times daily  Taking Active   insulin detemir (LEVEMIR FLEXTOUCH) 100 UNIT/ML FlexPen [id number] Yes Inject 40 Units into the skin daily.  Taking Active   insulin lispro (HUMALOG) 100 UNIT/ML KwikPen [id number] Yes Inject 10 Units into the skin every morning AND 6 Units daily with lunch AND 6 Units daily with supper.  Patient taking differently: Inject 14 Units into the skin every morning AND 14 Units  daily with lunch AND 14Units daily with supper.    Taking Active   Insulin Pen Needle 32G X 4 MM MISC [id number] Yes Use daily with insulin as directed  Taking Active   menthol-cetylpyridinium (CEPACOL) 3 MG lozenge [id number] Yes Take 1 lozenge (3 mg total) by mouth as needed for sore throat. Sharlene Dory, PA-C Taking Active   metFORMIN (GLUCOPHAGE-XR) 500 MG 24 hr tablet [id number] Yes Take 2 tablets (1,000 mg total) by mouth 2 (two) times daily. For Diabetes  Early, Sung Amabile, NP Taking Active   metoprolol tartrate (LOPRESSOR) 25 MG tablet [id number] Yes Take 1 tablet (25 mg total) by mouth 2 (two) times daily. Alver Sorrow, NP Taking Active   nitroGLYCERIN (NITROSTAT) 0.4 MG SL tablet [id number] Yes one tablet under tongue, q5 minutes for chest pain [provider] Taking Active   pantoprazole (PROTONIX) 40 MG tablet [id number] Yes Take 1 tablet (40 mg total) by mouth daily.  Taking Active   polyethylene glycol (MIRALAX / GLYCOLAX) 17 g packet [id number] Yes Take 17 g by mouth daily as needed for mild constipation. [provider] Taking Active Self  rosuvastatin (CRESTOR) 20 MG tablet [id number] Yes Take 0.5 tablets (10 mg total) by mouth daily. Or you may take 1 tablet (20mg ) every other day if the pill is too small to split. Tollie Eth, NP Taking Active   tamsulosin (FLOMAX) 0.4 MG CAPS capsule [id number] No Take 1 capsule (0.4 mg total) by mouth daily.  Patient not taking: Reported on 05/19/2021   Bunnie Domino Not Taking Active   Tetrahydrozoline HCl Lurlean Horns [place] OP) [id number] Yes Place 1 drop into both eyes as needed (itching). [provider] Taking Active Self  traMADol (ULTRAM) 50 MG tablet [id number] Yes Take 1 tablet (50 mg total) by mouth every 6 (six) hours as needed. Doree Fudge M, PA-C Taking Active   umeclidinium-vilanterol [facility] ELLIPTA) 62.5-25 MCG/ACT AEPB [id number] Yes Inhale 1 puff into the lungs daily. For breathing Early, Sung Amabile, NP Taking Active   Vitamin D, Ergocalciferol, (DRISDOL) 1.25 MG (50000 UNIT) CAPS capsule [id number] No Take 1 capsule (50,000 Units total) by mouth once a week.  Patient not taking: Reported on 05/19/2021    Not Taking Active   zolpidem (AMBIEN) 10 MG tablet [id number] Yes Take 1 tablet (10 mg total) by mouth at bedtime as needed for sleep  Taking Active              SDOH:  (Social Determinants of Health) assessments and interventions performed:     Care  Plan  Review of patient past medical history, allergies, medications, health status, including review of consultants reports, laboratory and other test data, was performed as part of comprehensive evaluation for care management services.   Care Plan : RN Care Manager Plan of Care  Updates made by Alejandro Mulling, RN since 08/05/2021 12:00 AM     Problem: Knowledge Deficit related to Diabetes and Care Coordination Needs   Priority: High     Long-Range Goal: Development plan of care for management of Diabetes   Start Date: 03/07/2021  Expected End Date: 11/14/2021  This Visit's Progress: On track  Recent Progress: On track  Priority: High  Note:   Current Barriers:  Knowledge Deficits related to plan of care for management of DMII Chronic Disease Management support and education needs related to DMII  RNCM Clinical Goal(s):  Patient will verbalize understanding of plan for management of DMII verbalize basic understanding  of  DMII disease process and self health management plan utilizing the discussed plan of care and information provided today. take all medications exactly as prescribed and will call provider for medication related questions attend all scheduled medical appointments: Continuation of monitoring blood sugars and adhering to diabetic diet . continue to work with RN Care Manager to address care management and care coordination needs related to  DMII through collaboration with RN Care manager, provider, and care team.   Interventions: Inter-disciplinary care team collaboration (see longitudinal plan of care) Evaluation of current treatment plan related to  self management and patient's adherence to plan as established by provider  Diabetes Interventions: Assessed patient's understanding of A1c goal: <6.5% Provided education to patient about basic DM disease process; Counseled on importance of regular laboratory monitoring as prescribed; Advised patient, providing  education and rationale, to check cbg TID and record, calling primary provider for findings outside established parameters; Lab Results  Component Value Date   HGBA1C 8.8 (H) 01/21/2021  Update 12/20:Pt reports his A1C should be lower with the recent changes in he dietary habits. Pt states a recent visit with his primary provider to concurs with the expectance of a improved readings. Pt continue to manage his diabetes with baseline readings and no experience of hypo-hyperglycemia readouts. Will continue review of the above goals and interventions and encouraged adherence to all discussed.  1/18- Pt reports issues with one of his medications Levemir. States pharmacy indicates back order and last dose was from his provider's office. Pt only has 4 dose/days left and needing a refill however having difficulty getting through to his provider. RN offered to intervene and was able to call and leave Dr. Silvano Rusk nurse a voice message. Will follow up over the next few weeks concerning any interventions provided by the provider's office. Pt aware to call RN back if no call back from the provider's office over the next few days. Pt reports his CBG have been fluctuating from 200-300 with some days around 160-180. No other issues related to medications. Pt states he has not had another A1C that he is aware of since Oct. Will continue to reiterated on the interventions and reinforce better eating habits along with increase mobility for improving this daily glucose levels.  2/10 Pt reports issues with receiving his insulin from his local pharmacy [facility]) reporting limited supplies and referring pt to go to [facility] who is requesting pt cover the entire amount of his 4 pens for 90 day supply. Pt unable to afford and move to [facility] pharmacy for all his medications moving forward. Pt was having CBG readings around 209 with this incident while not receiving the full coverage of insulin but reports today's with the new  pharmacy and receival of medications CBG at 176-187 asymptomatic. Discussed healthier eating habits and confirmed no high or low readings have been experienced. Pt states his son will be working on his medication management this weekend. RN offered [facility] pharmacy to intervene if no resolution or pt remains confused with his medication management. RN also encourage pt to reach out to his provider on any other issues related to his medications. Provided RN contact number and offered to follow up in a few weeks for an update on his ongoing management of care related to his medications.   3/14 Update: Pt remains on track and reports his last CBG was around 200 and he is aware of the elevate (dietary related). RN reiterated on a healthier diet and stress the importance  of reducing his blood glucose levels to avoid the risk of other issues that may occur as a result of elevated glucose levels. Pt verbalized he is aware and will get his family member to obtain his prescribed glucose strips for continuous monitoring. Discussed recent A1C has reduced to 7.9 on 2/28  from 8.8 in Oct. Praise pt for this success and will continue to advocate for reducing this value to again lower the risk. Pt reports he has been checking his sore feet (history) with no sores present. Pt has changed provider and pharmacy which have been noted in Epic.  Other issues mentioned today by pt related to ongoing arthritis all over as pt has consult his providers with limited resolutions due to being a diabetic and unable to receive Prednisone shots. Encouraged ROM exercises prior to ambulating in the morning along with hand exercises for improving his blood flow and to use his assisted device at all times when ambulating to prevent risk for falls or injuries (pt receptive).   08/04/2021 Update: Pt states he is pending a new glucometer his recent device is no long working. States he is sure his readings have improved with the most recent changes but  not exactly sure until his gets his new device. Reports ongoing adherence with all he appointments with his new provider Enid Skeens NP with [facility]. No changes in his medication and pt continues to have sufficient transportation at this time. Strongly encourage to obtain his glucometer for his ongoing glucose readings and encouraged pt to call this RN case management with any delays or obstacles in obtaining this device. Plan of care discussed with encouragement to continue adherence with all discussed today. No other inquires or questions at this time.    Diabetes Interventions:  (Status:  Goal on track:  Yes.) Long Term Goal Assessed patient's understanding of A1c goal: <6.5% Provided education to patient about basic DM disease process Reviewed medications with patient and discussed importance of medication adherence Discussed plans with patient for ongoing care management follow up and provided patient with direct contact information for care management team Advised patient, providing education and rationale, to check cbg as prescribed and record, calling provider  for findings outside established parameters Lab Results  Component Value Date   HGBA1C 7.9 (H) 06/14/2021  Patient Goals/Self-Care Activities: Patient will self administer medications as prescribed Patient will attend all scheduled provider appointments Patient will call pharmacy for medication refills Patient will attend church or other social activities Patient will continue to perform ADL's independently Patient will continue to perform IADL's independently Patient will call provider office for new concerns or questions  Follow Up Plan:  Telephone follow up appointment with care management team member scheduled for:  May 2023 The patient has been provided with contact information for the care management team and has been advised to call with any health related questions or concerns.       Elliot Cousin,  RN Care Management Coordinator [facility] Main Office [phone]

## 2021-08-10 ENCOUNTER — Other Ambulatory Visit (HOSPITAL_BASED_OUTPATIENT_CLINIC_OR_DEPARTMENT_OTHER): Payer: Self-pay

## 2021-08-10 DIAGNOSIS — E1165 Type 2 diabetes mellitus with hyperglycemia: Secondary | ICD-10-CM

## 2021-08-10 DIAGNOSIS — E1140 Type 2 diabetes mellitus with diabetic neuropathy, unspecified: Secondary | ICD-10-CM

## 2021-08-10 DIAGNOSIS — I25118 Atherosclerotic heart disease of native coronary artery with other forms of angina pectoris: Secondary | ICD-10-CM

## 2021-08-10 MED ORDER — GABAPENTIN 300 MG PO CAPS: 600.0000 mg | ORAL_CAPSULE | Freq: Two times a day (BID) | ORAL | 11 refills | Status: DC

## 2021-08-10 MED ORDER — ALLOPURINOL 100 MG PO TABS: 100.0000 mg | ORAL_TABLET | Freq: Every day | ORAL | 2 refills | Status: DC

## 2021-08-10 MED ORDER — METOPROLOL TARTRATE 25 MG PO TABS: 25.0000 mg | ORAL_TABLET | Freq: Two times a day (BID) | ORAL | 2 refills | Status: DC

## 2021-08-15 ENCOUNTER — Other Ambulatory Visit (HOSPITAL_COMMUNITY): Payer: Self-pay

## 2021-08-15 ENCOUNTER — Encounter (HOSPITAL_BASED_OUTPATIENT_CLINIC_OR_DEPARTMENT_OTHER): Payer: Self-pay | Admitting: Nurse Practitioner

## 2021-08-15 ENCOUNTER — Ambulatory Visit (HOSPITAL_BASED_OUTPATIENT_CLINIC_OR_DEPARTMENT_OTHER): Payer: Medicare Other | Admitting: Nurse Practitioner

## 2021-08-15 ENCOUNTER — Ambulatory Visit (INDEPENDENT_AMBULATORY_CARE_PROVIDER_SITE_OTHER): Payer: Medicare Other | Admitting: Nurse Practitioner

## 2021-08-15 DIAGNOSIS — G894 Chronic pain syndrome: Secondary | ICD-10-CM

## 2021-08-15 DIAGNOSIS — F5104 Psychophysiologic insomnia: Secondary | ICD-10-CM | POA: Diagnosis not present

## 2021-08-15 DIAGNOSIS — Z20822 Contact with and (suspected) exposure to covid-19: Secondary | ICD-10-CM | POA: Diagnosis not present

## 2021-08-15 MED ORDER — ZOLPIDEM TARTRATE 10 MG PO TABS
10.0000 mg | ORAL_TABLET | Freq: Every evening | ORAL | 1 refills | Status: DC | PRN
  Filled 2021-08-15: qty 30, 30d supply, fill #0
  Filled 2021-09-13: qty 30, 30d supply, fill #1
  Filled 2021-10-12: qty 30, 30d supply, fill #2
  Filled 2021-11-09: qty 30, 30d supply, fill #3
  Filled 2021-12-09: qty 30, 30d supply, fill #4
  Filled 2022-01-09: qty 30, 30d supply, fill #5

## 2021-08-15 NOTE — Progress Notes (Signed)
Virtual Visit Encounter telephone visit. ? ? ?I connected with  Paul [name] on 08/15/21 at 10:30 AM EDT by secure audio telemedicine application. I verified that I am speaking with the correct person using two identifiers. ?  ?I introduced myself as a Designer, jewellery with the practice. The limitations of evaluation and management by telemedicine discussed with the patient and the availability of in person appointments. The patient expressed verbal understanding and consent to proceed. ? ?Participating parties in this visit include: Myself and patient ? ?The patient is: Patient Location: Home ?I am: Provider Location: Office/Clinic ?Subjective:   ? ?CC and HPI: Paul [name] is a 71 y.o. year old male presenting for follow up of insomnia and pain. ? ?Mr. Strausser in in need of refill on his Lorrin Mais for sleep. He has chronically been taking '10mg'$  at bedtime, which is helpful for his symptoms. He has a history of PTSD which exacerbates his insomnia. He was previously receiving this medication through the [place]. He has tried the '5mg'$  dose and this was not effective. He has no side effects of the medication.  ? ?He also has questions today about his pain management. He has been seen through the pain clinic and prescribed 3 Norco 5-325 a day. His insurance only covers 21 tabs per month and he would like to know if he is able to pay cash for the remainder of the prescription as he is not able to make the 21 tablets last for the full month and control his pain.  ? ?Past medical history, Surgical history, Family history not pertinant except as noted below, Social history, Allergies, and medications have been entered into the medical record, reviewed, and corrections made.  ? ?Review of Systems:  ?All review of systems negative except what is listed in the HPI ? ?Objective:   ? ?Alert and oriented x 4 ?Speaking in clear sentences with no shortness of breath. ?No distress. ? ?Impression and Recommendations:   ? ?Problem List Items  Addressed This Visit   ? ? Chronic pain syndrome  ?  Chronic. Well controlled on Nocro 5-325 TID. Unable to get insurance coverage for more than 21 tabs per month. Seeing pain management who has prescribed 90 tabs per month. Not controlled with lower/less frequent dosing resulting in exacerbation of pain.  ?Recommend patient contact he pharmacy to discuss the cost of remainder of prescription out of pocket. Unable to provide additional prescription for this as he has an active one with pain management and this is the safest option for obtaining his medication. I will also send a message to pharmacy to explain the situation. I am hopeful this can be worked out for him to continue his medication. If not, recommend discussion with pain management for prior authorization for the full prescription coverage or change in medication to allow for this.  ? ? ?  ?  ? Psychophysiological insomnia - Primary  ?  Insomnia secondary to PTSD, depression, and chronic pain. Well controlled on ambien '10mg'$ . Long term medication. Failed trial of '5mg'$ .  ?Given the patients mental health status and control with current regimen, will plan to continue at this time. No recent falls or adverse events related to medication reported.  ?Will send 90 days with 1 refill and plan to follow-up with phone visit in 6 months or at next follow-up appointment for further evaluation.  ? ? ?  ?  ? ? ?orders and follow up as documented in EMR ?I discussed the assessment and treatment plan  with the patient. The patient was provided an opportunity to ask questions and all were answered. The patient agreed with the plan and demonstrated an understanding of the instructions. ?  ?The patient was advised to call back or seek an in-person evaluation if the symptoms worsen or if the condition fails to improve as anticipated. ? ?Follow-Up: in 6 months ? ?I provided 19 minutes of non-face-to-face interaction with this non face-to-face encounter including intake,  same-day documentation, and chart review.  ? ?Orma Render, NP , DNP, AGNP-c ?[facility] ?Primary Care & Sports Medicine at [facility] ?[phone] ?[phone] (fax) ? ?

## 2021-08-15 NOTE — Assessment & Plan Note (Signed)
Chronic. Well controlled on Nocro 5-325 TID. Unable to get insurance coverage for more than 21 tabs per month. Seeing pain management who has prescribed 90 tabs per month. Not controlled with lower/less frequent dosing resulting in exacerbation of pain.  ?Recommend patient contact he pharmacy to discuss the cost of remainder of prescription out of pocket. Unable to provide additional prescription for this as he has an active one with pain management and this is the safest option for obtaining his medication. I will also send a message to pharmacy to explain the situation. I am hopeful this can be worked out for him to continue his medication. If not, recommend discussion with pain management for prior authorization for the full prescription coverage or change in medication to allow for this.  ? ?

## 2021-08-15 NOTE — Assessment & Plan Note (Signed)
Insomnia secondary to PTSD, depression, and chronic pain. Well controlled on ambien '10mg'$ . Long term medication. Failed trial of '5mg'$ .  ?Given the patients mental health status and control with current regimen, will plan to continue at this time. No recent falls or adverse events related to medication reported.  ?Will send 90 days with 1 refill and plan to follow-up with phone visit in 6 months or at next follow-up appointment for further evaluation.  ? ?

## 2021-08-15 NOTE — Patient Instructions (Signed)
I recommend contacting the pharmacy to see if they will allow you to cover the cost of the remaining pain medication prescription with cash pay. If they cannot do this, Pain management may need to contact the insurance company for a prior authorization to allow you to get the full prescription. Unfortunately, I am unable to do that since I am not the prescriber and we don't prescribe long term pain medication from the office.  ? ?I have sent in refills on your ambin for you. I sent in 90 day supply with 1 refill so that should get you through the next 6 months.  ? ?Please let me know if you have any problems.  ?SaraBeth  ?

## 2021-08-16 DIAGNOSIS — Z20822 Contact with and (suspected) exposure to covid-19: Secondary | ICD-10-CM | POA: Diagnosis not present

## 2021-08-19 ENCOUNTER — Other Ambulatory Visit (HOSPITAL_COMMUNITY): Payer: Self-pay

## 2021-08-19 DIAGNOSIS — G894 Chronic pain syndrome: Secondary | ICD-10-CM | POA: Diagnosis not present

## 2021-08-19 MED ORDER — HYDROCODONE-ACETAMINOPHEN 5-325 MG PO TABS
1.0000 | ORAL_TABLET | Freq: Three times a day (TID) | ORAL | 0 refills | Status: DC | PRN
  Filled 2021-10-21: qty 90, 30d supply, fill #0

## 2021-08-19 MED ORDER — HYDROCODONE-ACETAMINOPHEN 5-325 MG PO TABS
1.0000 | ORAL_TABLET | Freq: Three times a day (TID) | ORAL | 0 refills | Status: DC | PRN
  Filled 2021-08-19: qty 21, 7d supply, fill #0
  Filled 2021-08-19: qty 90, 30d supply, fill #0
  Filled 2021-08-26: qty 69, 23d supply, fill #1

## 2021-08-19 MED ORDER — HYDROCODONE-ACETAMINOPHEN 5-325 MG PO TABS
1.0000 | ORAL_TABLET | Freq: Three times a day (TID) | ORAL | 0 refills | Status: DC | PRN
  Filled 2021-09-23: qty 90, 30d supply, fill #0

## 2021-08-22 ENCOUNTER — Other Ambulatory Visit (HOSPITAL_COMMUNITY): Payer: Self-pay

## 2021-08-22 DIAGNOSIS — Z20822 Contact with and (suspected) exposure to covid-19: Secondary | ICD-10-CM | POA: Diagnosis not present

## 2021-08-23 ENCOUNTER — Other Ambulatory Visit (HOSPITAL_COMMUNITY): Payer: Self-pay

## 2021-08-26 ENCOUNTER — Other Ambulatory Visit (HOSPITAL_COMMUNITY): Payer: Self-pay

## 2021-08-31 LAB — HM DIABETES EYE EXAM

## 2021-09-02 ENCOUNTER — Other Ambulatory Visit: Payer: Self-pay | Admitting: *Deleted

## 2021-09-02 NOTE — Patient Outreach (Signed)
[facility]) Care Management  09/02/2021  Paul [name] 1950-07-20 [id number]   Referral for medical assistance for glucometer from [name], RN sent to [facility] pharmacy via email.  [facility] Management Assistant [phone]

## 2021-09-02 NOTE — Patient Outreach (Signed)
[facility]) Care Management Telephonic RN Care Manager Note   09/02/2021 Name:  Paul [name] MRN:  [id number] DOB:  [birth year]  Summary: Pt continue to need a new glucometer and was unsuccessful in obtaining one. Pt aware when his glucose levels are to high  or low. No readings reported at this time. Plan of care reviewed and discussed stressing the importance of glucose monitoring in regulating and managing his diabetes. Pt aware and is willing to monitor his ongoing glucose levels with a new meter once received.  Recommendations/Changes made from today's visit: Referral made to [place] to assist further with obtaining a new glucometer for ongoing diabetes management.   Subjective: Paul [name] is an 71 y.o. year old male who is a primary patient of Early, [name], NP. The care management team was consulted for assistance with care management and/or care coordination needs.    Telephonic RN Care Manager completed Telephone Visit today.  Objective:   Medications Reviewed Today     Reviewed by Orma Render, NP (Nurse Practitioner) on 08/15/21 at [phone]  Med List Status: <None>   Medication Order Taking? Sig Documenting Provider Last Dose Status Informant  acetaminophen (TYLENOL) 500 MG tablet [id number] No Take 1,500-2,000 mg by mouth daily. [provider] Taking Active Self  albuterol (PROVENTIL) (2.5 MG/3ML) 0.083% nebulizer solution [id number]  Take 3 mLs by nebulization every 4 (four) hours as needed.   Active   albuterol (PROVENTIL) (2.5 MG/3ML) 0.083% nebulizer solution [id number] No Take 3 mLs (2.5 mg total) by nebulization every 4 (four) hours as needed for wheezing or shortness of breath. Orma Render, NP Taking Active   albuterol (VENTOLIN HFA) 108 (90 Base) MCG/ACT inhaler [id number] No Inhale 2 puffs into the lungs every 4 (four) hours as needed.  Taking Active   albuterol (VENTOLIN HFA) 108 (90 Base) MCG/ACT inhaler [id number]  Inhale 2 puffs into the  lungs every 4 (four) hours as needed. For breathing. Orma Render, NP  Active   allopurinol (ZYLOPRIM) 100 MG tablet [id number]  Take 1 tablet (100 mg total) by mouth daily. Orma Render, NP  Active   amLODipine (NORVASC) 5 MG tablet [id number] No Take 1 tablet (5 mg total) by mouth daily.  Taking Active   aspirin 81 MG EC tablet [id number] No Take 1 tablet (81 mg total) by mouth daily.  Taking Active   benzonatate (TESSALON PERLES) 100 MG capsule [id number] No Take 1 capsule (100 mg total) by mouth 3 (three) times daily as needed for cough.  Taking Active   clopidogrel (PLAVIX) 75 MG tablet [id number]  Take 1 tablet (75 mg total) by mouth daily. Orma Render, NP  Active   furosemide (LASIX) 20 MG tablet [id number] No Take 1 tablet (20 mg total) by mouth daily.  Taking Active   gabapentin (NEURONTIN) 300 MG capsule [id number]  Take 2 capsules (600 mg total) by mouth 2 (two) times daily. For back and foot pain. Orma Render, NP  Active   glucose blood (ONETOUCH VERIO) test strip [id number] No Use to check blood glucose 3 times daily  Taking Active   HYDROcodone-acetaminophen (NORCO/VICODIN) 5-325 MG tablet [id number]  Take 1 tablet by mouth every 8 (eight) hours as needed for pain   Active   insulin detemir (LEVEMIR FLEXTOUCH) 100 UNIT/ML FlexPen [id number]  Inject 40 Units into the skin daily. Orma Render, NP  Active   insulin lispro (HUMALOG KWIKPEN) 200 UNIT/ML KwikPen [id number]  Inject 14 Units  into the skin every morning AND 14 Units daily with lunch AND 14Units daily with supper. Orma Render, NP  Active   Insulin Pen Needle 32G X 4 MM MISC [id number] No Use daily with insulin as directed  Taking Active   menthol-cetylpyridinium (CEPACOL) 3 MG lozenge [id number] No Take 1 lozenge (3 mg total) by mouth as needed for sore throat. Elgie Collard, PA-C Taking Active   metFORMIN (GLUCOPHAGE-XR) 500 MG 24 hr tablet [id number] No Take 2 tablets (1,000 mg total) by mouth 2 (two) times daily. For Diabetes Early,  [name], NP Taking Active   metoprolol tartrate (LOPRESSOR) 25 MG tablet [id number]  Take 1 tablet (25 mg total) by mouth 2 (two) times daily. Orma Render, NP  Active   nitroGLYCERIN (NITROSTAT) 0.4 MG SL tablet [id number] No one tablet under tongue, q5 minutes for chest pain [provider] Taking Active   pantoprazole (PROTONIX) 40 MG tablet [id number] No Take 1 tablet (40 mg total) by mouth daily.  Patient not taking: Reported on 06/28/2021    Not Taking Active   polyethylene glycol (MIRALAX / GLYCOLAX) 17 g packet [id number] No Take 17 g by mouth daily as needed for mild constipation. [provider] Taking Active Self  rosuvastatin (CRESTOR) 20 MG tablet [id number] No Take 0.5 tablets (10 mg total) by mouth daily. Or you may take 1 tablet ('20mg'$ ) every other day if the pill is too small to split. Orma Render, NP Taking Active   tamsulosin (FLOMAX) 0.4 MG CAPS capsule [id number] No Take 1 capsule (0.4 mg total) by mouth daily.  Patient not taking: Reported on 05/19/2021   Elgie Collard, PA-C Not Taking Active   Tetrahydrozoline HCl Karma Lew EXTRA OP) [id number] No Place 1 drop into both eyes as needed (itching). [provider] Taking Active Self  umeclidinium-vilanterol (ANORO ELLIPTA) 62.5-25 MCG/ACT AEPB [id number] No Inhale 1 puff into the lungs daily. For breathing Early, [name], NP Taking Active   Vitamin D, Ergocalciferol, (DRISDOL) 1.25 MG (50000 UNIT) CAPS capsule [id number] No Take 1 capsule (50,000 Units total) by mouth once a week.  Patient not taking: Reported on 05/19/2021    Not Taking Active   zolpidem (AMBIEN) 10 MG tablet [id number]  Take 1 tablet (10 mg total) by mouth at bedtime as needed for sleep Early, [name], NP  Active              SDOH:  (Social Determinants of Health) assessments and interventions performed:     Care Plan  Review of patient past medical history, allergies, medications, health status, including review of consultants reports,  laboratory and other test data, was performed as part of comprehensive evaluation for care management services.   Care Plan : RN Care Manager Plan of Care  Updates made by Tobi Bastos, RN since 09/02/2021 12:00 AM     Problem: Knowledge Deficit related to Diabetes and Care Coordination Needs   Priority: High     Long-Range Goal: Development plan of care for management of Diabetes   Start Date: 03/07/2021  Expected End Date: 11/14/2021  This Visit's Progress: On track  Recent Progress: On track  Priority: High  Note:   Current Barriers:  Knowledge Deficits related to plan of care for management of DMII Chronic Disease Management support and education needs related to DMII  RNCM Clinical Goal(s):  Patient will verbalize understanding of plan for management of DMII verbalize basic understanding of  DMII disease process and self  health management plan utilizing the discussed plan of care and information provided today. take all medications exactly as prescribed and will call provider for medication related questions attend all scheduled medical appointments: Continuation of monitoring blood sugars and adhering to diabetic diet . continue to work with RN Care Manager to address care management and care coordination needs related to  DMII through collaboration with RN Care manager, provider, and care team.   Interventions: Inter-disciplinary care team collaboration (see longitudinal plan of care) Evaluation of current treatment plan related to  self management and patient's adherence to plan as established by provider  Diabetes Interventions: Assessed patient's understanding of A1c goal: <6.5% Provided education to patient about basic DM disease process; Counseled on importance of regular laboratory monitoring as prescribed; Advised patient, providing education and rationale, to check cbg TID and record, calling primary provider for findings outside established parameters; Lab  Results  Component Value Date   HGBA1C 8.8 (H) 01/21/2021  Update 12/20:Pt reports his A1C should be lower with the recent changes in he dietary habits. Pt states a recent visit with his primary provider to concurs with the expectance of a improved readings. Pt continue to manage his diabetes with baseline readings and no experience of hypo-hyperglycemia readouts. Will continue review of the above goals and interventions and encouraged adherence to all discussed.  1/18- Pt reports issues with one of his medications Levemir. States pharmacy indicates back order and last dose was from his provider's office. Pt only has 4 dose/days left and needing a refill however having difficulty getting through to his provider. RN offered to intervene and was able to call and leave Dr. Shon Baton nurse a voice message. Will follow up over the next few weeks concerning any interventions provided by the provider's office. Pt aware to call RN back if no call back from the provider's office over the next few days. Pt reports his CBG have been fluctuating from 200-300 with some days around 160-180. No other issues related to medications. Pt states he has not had another A1C that he is aware of since Oct. Will continue to reiterated on the interventions and reinforce better eating habits along with increase mobility for improving this daily glucose levels.  2/10 Pt reports issues with receiving his insulin from his local pharmacy [facility]) reporting limited supplies and referring pt to go to [facility] who is requesting pt cover the entire amount of his 4 pens for 90 day supply. Pt unable to afford and move to [place] for all his medications moving forward. Pt was having CBG readings around 209 with this incident while not receiving the full coverage of insulin but reports today's with the new pharmacy and receival of medications CBG at 176-187 asymptomatic. Discussed healthier eating habits and confirmed no high or low  readings have been experienced. Pt states his son will be working on his medication management this weekend. RN offered [place] to intervene if no resolution or pt remains confused with his medication management. RN also encourage pt to reach out to his provider on any other issues related to his medications. Provided RN contact number and offered to follow up in a few weeks for an update on his ongoing management of care related to his medications.   3/14 Update: Pt remains on track and reports his last CBG was around 200 and he is aware of the elevate (dietary related). RN reiterated on a healthier diet and stress the importance of reducing his blood glucose levels to  avoid the risk of other issues that may occur as a result of elevated glucose levels. Pt verbalized he is aware and will get his family member to obtain his prescribed glucose strips for continuous monitoring. Discussed recent A1C has reduced to 7.9 on 2/28  from 8.8 in Oct. Praise pt for this success and will continue to advocate for reducing this value to again lower the risk. Pt reports he has been checking his sore feet (history) with no sores present. Pt has changed provider and pharmacy which have been noted in [place].  Other issues mentioned today by pt related to ongoing arthritis all over as pt has consult his providers with limited resolutions due to being a diabetic and unable to receive Prednisone shots. Encouraged ROM exercises prior to ambulating in the morning along with hand exercises for improving his blood flow and to use his assisted device at all times when ambulating to prevent risk for falls or injuries (pt receptive).   08/04/2021 Update: Pt states he is pending a new glucometer his recent device is no long working. States he is sure his readings have improved with the most recent changes but not exactly sure until his gets his new device. Reports ongoing adherence with all he appointments with his new provider Jacolyn Reedy  NP with [facility] at [facility]. No changes in his medication and pt continues to have sufficient transportation at this time. Strongly encourage to obtain his glucometer for his ongoing glucose readings and encouraged pt to call this RN case management with any delays or obstacles in obtaining this device. Plan of care discussed with encouragement to continue adherence with all discussed today. No other inquires or questions at this time.   09/02/2021 Update: Pt reports he has not obtained a glucometer and needs help getting one. RN will refer to a [place] to assist with this process. Pt states he usually is aware when his BS are to high or low but again does not have a working glucometer to verify numbers. Pt denies any related symptoms and no other needs presented at this time. Pt was suppose to reach out to his provider and allow his friend to call with this requested however pt has not received a glucometer. Will follow up in a few weeks to inquire further on the process of obtaining a glucometer via [place]. Plan of care discussed and pt encouraged to continue to adhere to all discussed. No other needs presented at this time.  Diabetes Interventions:  (Status:  Goal on track:  Yes.) Long Term Goal Assessed patient's understanding of A1c goal: <6.5% Provided education to patient about basic DM disease process Reviewed medications with patient and discussed importance of medication adherence Discussed plans with patient for ongoing care management follow up and provided patient with direct contact information for care management team Advised patient, providing education and rationale, to check cbg as prescribed and record, calling provider  for findings outside established parameters Lab Results  Component Value Date   HGBA1C 7.9 (H) 06/14/2021  Patient Goals/Self-Care Activities: Patient will self administer medications as prescribed Patient will attend all scheduled provider  appointments Patient will call pharmacy for medication refills Patient will attend church or other social activities Patient will continue to perform ADL's independently Patient will continue to perform IADL's independently Patient will call provider office for new concerns or questions  Follow Up Plan:  Telephone follow up appointment with care management team member scheduled for:  June 2023 The patient  has been provided with contact information for the care management team and has been advised to call with any health related questions or concerns.       [name], RN Care Management Coordinator [place] Office [phone]

## 2021-09-07 ENCOUNTER — Other Ambulatory Visit (HOSPITAL_COMMUNITY): Payer: Self-pay

## 2021-09-13 ENCOUNTER — Other Ambulatory Visit (HOSPITAL_COMMUNITY): Payer: Self-pay

## 2021-09-19 ENCOUNTER — Ambulatory Visit (INDEPENDENT_AMBULATORY_CARE_PROVIDER_SITE_OTHER): Payer: Medicare Other | Admitting: Nurse Practitioner

## 2021-09-19 ENCOUNTER — Encounter: Payer: Self-pay | Admitting: Nurse Practitioner

## 2021-09-19 VITALS — BP 118/62 | HR 74 | Temp 98.6°F | Ht 65.0 in | Wt 185.8 lb

## 2021-09-19 DIAGNOSIS — J449 Chronic obstructive pulmonary disease, unspecified: Secondary | ICD-10-CM

## 2021-09-19 DIAGNOSIS — J3089 Other allergic rhinitis: Secondary | ICD-10-CM | POA: Diagnosis not present

## 2021-09-19 DIAGNOSIS — R0609 Other forms of dyspnea: Secondary | ICD-10-CM

## 2021-09-19 DIAGNOSIS — Z951 Presence of aortocoronary bypass graft: Secondary | ICD-10-CM | POA: Diagnosis not present

## 2021-09-19 DIAGNOSIS — Z87891 Personal history of nicotine dependence: Secondary | ICD-10-CM

## 2021-09-19 MED ORDER — STIOLTO RESPIMAT 2.5-2.5 MCG/ACT IN AERS: 2.0000 | INHALATION_SPRAY | Freq: Every day | RESPIRATORY_TRACT | 5 refills | Status: DC

## 2021-09-19 MED ORDER — FLUTICASONE PROPIONATE 50 MCG/ACT NA SUSP: 2.0000 | Freq: Every day | NASAL | 2 refills | Status: DC

## 2021-09-19 MED ORDER — STIOLTO RESPIMAT 2.5-2.5 MCG/ACT IN AERS: 2.0000 | INHALATION_SPRAY | Freq: Every day | RESPIRATORY_TRACT | 0 refills | Status: DC

## 2021-09-19 NOTE — Assessment & Plan Note (Signed)
Intermittent symptoms. Recommended he start intranasal steroid for postnasal drainage control. This could also be contributing to his cough. Consider OTC antihistamine during allergy season.

## 2021-09-19 NOTE — Assessment & Plan Note (Signed)
Generalized fatigue and some progressive DOE since procedure; deconditioning vs poorly controlled COPD. May consider referral to cardiopulmonary rehab if no improvement with initiation of maintenance therapy. Follow up with cardiology as scheduled.

## 2021-09-19 NOTE — Assessment & Plan Note (Signed)
Former heavy smoker. CTA chest from September without any incidental findings of lung nodules. Referred to lung cancer screening program today.

## 2021-09-19 NOTE — Progress Notes (Signed)
$'@Patient'c$  ID: Paul [name], male    DOB: 1950/10/26, 71 y.o.   MRN: [id number]  Chief Complaint  Patient presents with   Follow-up    Pt is here for a check up. He was last seen in 2021 by [facility]. Pt states he is here because he needs refills on his inhalers. Pt was on Albuterol as needed and Anoro daily. Pt was also on Albuterol nebs.     Referring provider: Orma Render, NP  HPI: 71 year old male, former smoker followed for COPD.  He is a patient of Dr. Agustina Caroli and last seen in office 02/23/2020 by Warner Mccreedy NP.  Past medical history significant for hypertension, HLD, CAD status post CABG, GERD, dysphagia, DM 2  TEST/EVENTS:  09/20/2018 PFTs: FVC 3.51 (96%), FEV1 2.46 (92), ratio 70, TLC 97, DLCOunc 84%.  No significant BD.  Mild obstructive airway disease. 01/09/2021 CTA chest: No evidence of central or segmental PE.  Atherosclerosis and 4 vessel CAD.  There is a 1.2 subcarinal lymph node and a 1 cm mediastinal lymph node.  Bilateral lower lobe subsegmental atelectasis is present.  There is no other acute process noted.  09/19/2021: Today - follow up Patient presents today for overdue follow up. He was last seen in 2021. Since we saw him last, he underwent CABG in October 2022.  He feels like his breathing has not been as great as it was prior to his procedure.  Feels like he gets a little more winded with activity.  He does have an occasional cough, which is primarily nonproductive.  He will occasionally get some clear sputum up in the morning.  He also has some occasional nasal congestion and postnasal drainage.  He does not ever notice himself wheezing and denies any hemoptysis, weight loss, lower extremity edema.  He is not currently on any maintenance inhalers as he ran out of his prescriptions.  He has been on Stiolto and Anoro in the past.  Did feel like Stiolto worked a little bit better for him.  He is using his albuterol a couple times a week.  He has not required any prednisone or antibiotics in  the last 6 months for respiratory infection/AECOPD.  He does have some occupational exposure history including working with asbestos, paint fumes, carpentry dust, and insulation.  No previous restriction on past pulmonary function testing.  Allergies  Allergen Reactions   Morphine And Related Itching    Possible itching due to morphine 01/26/21   Cortisone    Other Other (See Comments)    Cats- eyes burn    Immunization History  Administered Date(s) Administered   Fluad Quad(high Dose 65+) 02/23/2020   Influenza Split 01/05/2011, 01/08/2013, 01/15/2014, 02/09/2014   Influenza,inj,Quad PF,6+ Mos 02/17/2015   Influenza-Unspecified 02/08/2016, 01/15/2017, 03/10/2018   PFIZER(Purple Top)SARS-COV-2 Vaccination 06/19/2019, 07/16/2019   Pneumococcal Polysaccharide-23 05/13/2010    Past Medical History:  Diagnosis Date   CAD 11/24/2011   a) Mild-to-moderate 30-40% lesions in the RCA, LAD and Circumflex. b) CULPRIT LESION: long tubular 70-80% lesion in D1 with FFR of 0.7 --> PCI w/ Xience Xpedition DES 2.5 mm x 30 mm (2.65 MM); c) Lexiscan Myoview 11/2013: No Ischemia or Infarct (Inferior Gut Attenuation) EF 63%.   Constipation by delayed colonic transit 06/13/2021   COPD (chronic obstructive pulmonary disease) ([place])    "don't have full case of it; I'm right there at it"   Diabetes mellitus without complication ([place])    Dysphagia    Essential hypertension 11/24/2011   Exertional  dyspnea, chronic    Gout    Hyperlipidemia with target LDL less than 70 11/24/2011   Obesity (BMI 30.0-34.9)    OSA (obstructive sleep apnea), uses oxygen at home did not tolerate cpap 11/24/2011   S/P CABG (coronary artery bypass graft)     Tobacco History: Social History   Tobacco Use  Smoking Status Former   Packs/day: 1.00   Years: 40.00   Pack years: 40.00   Types: Cigarettes   Quit date: 06/10/2016   Years since quitting: 5.2  Smokeless Tobacco Never  Tobacco Comments   02/18/14- smokes occ cig  maybe 3 x per wk   Counseling given: Not Answered Tobacco comments: 02/18/14- smokes occ cig maybe 3 x per wk   Outpatient Medications Prior to Visit  Medication Sig Dispense Refill   acetaminophen (TYLENOL) 500 MG tablet Take 1,500-2,000 mg by mouth daily.     albuterol (PROVENTIL) (2.5 MG/3ML) 0.083% nebulizer solution Take 3 mLs by nebulization every 4 (four) hours as needed. 540 mL 0   albuterol (PROVENTIL) (2.5 MG/3ML) 0.083% nebulizer solution Take 3 mLs (2.5 mg total) by nebulization every 4 (four) hours as needed for wheezing or shortness of breath. 75 mL 6   allopurinol (ZYLOPRIM) 100 MG tablet Take 1 tablet (100 mg total) by mouth daily. 90 tablet 2   amLODipine (NORVASC) 5 MG tablet Take 1 tablet (5 mg total) by mouth daily. 90 tablet 2   aspirin 81 MG EC tablet Take 1 tablet (81 mg total) by mouth daily. 30 tablet 0   benzonatate (TESSALON PERLES) 100 MG capsule Take 1 capsule (100 mg total) by mouth 3 (three) times daily as needed for cough. 30 capsule 0   clopidogrel (PLAVIX) 75 MG tablet Take 1 tablet (75 mg total) by mouth daily. 30 tablet 0   furosemide (LASIX) 20 MG tablet Take 1 tablet (20 mg total) by mouth daily. 30 tablet 0   gabapentin (NEURONTIN) 300 MG capsule Take 2 capsules (600 mg total) by mouth 2 (two) times daily. For back and foot pain. 120 capsule 11   glucose blood (ONETOUCH VERIO) test strip Use to check blood glucose 3 times daily 300 each 3   HYDROcodone-acetaminophen (NORCO/VICODIN) 5-325 MG tablet Take 1 tablet by mouth every 8 (eight) hours as needed for pain 90 tablet 0   HYDROcodone-acetaminophen (NORCO/VICODIN) 5-325 MG tablet Take 1 tablet by mouth every 8 (eight) hours as needed for moderate pain (4-6 on pain scale) 90 tablet 0   [START ON 10/17/2021] HYDROcodone-acetaminophen (NORCO/VICODIN) 5-325 MG tablet Take 1 tablet by mouth every 8 (eight) hours as needed for pain. Do not fill before 10/17/21. 90 tablet 0   HYDROcodone-acetaminophen (NORCO/VICODIN)  5-325 MG tablet Take 1 tablet by mouth every 8 (eight) hours as needed for pain. Do not fill before 09/18/21. 90 tablet 0   insulin detemir (LEVEMIR FLEXTOUCH) 100 UNIT/ML FlexPen Inject 40 Units into the skin daily. 54 mL 3   insulin lispro (HUMALOG KWIKPEN) 200 UNIT/ML KwikPen Inject 14 Units into the skin every morning AND 14 Units daily with lunch AND 14Units daily with supper. 15 mL 3   Insulin Pen Needle 32G X 4 MM MISC Use daily with insulin as directed 100 each 3   menthol-cetylpyridinium (CEPACOL) 3 MG lozenge Take 1 lozenge (3 mg total) by mouth as needed for sore throat. 100 tablet 12   metFORMIN (GLUCOPHAGE-XR) 500 MG 24 hr tablet Take 2 tablets (1,000 mg total) by mouth 2 (two) times daily.  For Diabetes 360 tablet 3   metoprolol tartrate (LOPRESSOR) 25 MG tablet Take 1 tablet (25 mg total) by mouth 2 (two) times daily. 180 tablet 2   nitroGLYCERIN (NITROSTAT) 0.4 MG SL tablet one tablet under tongue, q5 minutes for chest pain     pantoprazole (PROTONIX) 40 MG tablet Take 1 tablet (40 mg total) by mouth daily. 90 tablet 2   polyethylene glycol (MIRALAX / GLYCOLAX) 17 g packet Take 17 g by mouth daily as needed for mild constipation.     rosuvastatin (CRESTOR) 20 MG tablet Take 0.5 tablets (10 mg total) by mouth daily. Or you may take 1 tablet ('20mg'$ ) every other day if the pill is too small to split. 45 tablet 2   tamsulosin (FLOMAX) 0.4 MG CAPS capsule Take 1 capsule (0.4 mg total) by mouth daily. 30 capsule 0   Tetrahydrozoline HCl (VISINE EXTRA OP) Place 1 drop into both eyes as needed (itching).     Vitamin D, Ergocalciferol, (DRISDOL) 1.25 MG (50000 UNIT) CAPS capsule Take 1 capsule (50,000 Units total) by mouth once a week. 12 capsule 3   zolpidem (AMBIEN) 10 MG tablet Take 1 tablet (10 mg total) by mouth at bedtime as needed for sleep 90 tablet 1   albuterol (VENTOLIN HFA) 108 (90 Base) MCG/ACT inhaler Inhale 2 puffs into the lungs every 4 (four) hours as needed. 6.7 g 0   albuterol  (VENTOLIN HFA) 108 (90 Base) MCG/ACT inhaler Inhale 2 puffs into the lungs every 4 (four) hours as needed. For breathing. (Patient not taking: Reported on 09/19/2021) 18 g 3   umeclidinium-vilanterol (ANORO ELLIPTA) 62.5-25 MCG/ACT AEPB Inhale 1 puff into the lungs daily. For breathing (Patient not taking: Reported on 09/19/2021) 60 each 5   No facility-administered medications prior to visit.     Review of Systems:   Constitutional: No weight loss or gain, night sweats, fevers, chills, lassitude. + Fatigue (chronic) HEENT: No headaches, difficulty swallowing, tooth/dental problems, or sore throat. No sneezing, itching, ear ache + occasional nasal congestion, post nasal drip CV:  No chest pain, orthopnea, PND, swelling in lower extremities, anasarca, dizziness, palpitations, syncope Resp: +shortness of breath with exertion (progressive over the last few months); occasional cough. No excess mucus or change in color of mucus. No hemoptysis. No wheezing.  No chest wall deformity GI:  No heartburn, indigestion, abdominal pain, nausea, vomiting, diarrhea, change in bowel habits, loss of appetite, bloody stools.  Skin: No rash, lesions, ulcerations MSK:  No joint pain or swelling.  No decreased range of motion.  No back pain. Neuro: No dizziness or lightheadedness.  Psych: No depression or anxiety. Mood stable.     Physical Exam:  BP 118/62 (BP Location: Right Arm, Patient Position: Sitting, Cuff Size: Normal)   Pulse 74   Temp 98.6 F (37 C) (Oral)   Ht '5\' 5"'$  (1.651 m)   Wt 185 lb 12.8 oz (84.3 kg)   SpO2 94%   BMI 30.92 kg/m   GEN: Pleasant, interactive, well-appearing obese; in no acute distress. HEENT:  Normocephalic and atraumatic. PERRLA. Sclera white. Nasal turbinates pink, moist and patent bilaterally. No rhinorrhea present. Oropharynx pink and moist, without exudate or edema. No lesions, ulcerations, or postnasal drip.  NECK:  Supple w/ fair ROM. No JVD present. Normal carotid  impulses w/o bruits. Thyroid symmetrical with no goiter or nodules palpated. No lymphadenopathy.   CV: RRR, no m/r/g, no peripheral edema. Pulses intact, +2 bilaterally. No cyanosis, pallor or clubbing. PULMONARY:  Unlabored,  regular breathing. Clear bilaterally A&P w/o wheezes/rales/rhonchi. No accessory muscle use. No dullness to percussion. GI: BS present and normoactive. Soft, non-tender to palpation. No organomegaly or masses detected. No CVA tenderness. MSK: No erythema, warmth or tenderness. Cap refil <2 sec all extrem. No deformities or joint swelling noted.  Neuro: A/Ox3. No focal deficits noted.   Skin: Warm, no lesions or rashe Psych: Normal affect and behavior. Judgement and thought content appropriate.     Lab Results:  CBC    Component Value Date/Time   WBC 6.8 06/14/2021 1509   WBC 7.1 01/29/2021 0137   RBC 4.30 06/14/2021 1509   RBC 2.73 (L) 01/29/2021 0137   HGB 10.6 (L) 06/14/2021 1509   HCT 34.4 (L) 06/14/2021 1509   PLT 172 06/14/2021 1509   MCV 80 06/14/2021 1509   MCH 24.7 (L) 06/14/2021 1509   MCH 29.3 01/29/2021 0137   MCHC 30.8 (L) 06/14/2021 1509   MCHC 31.4 01/29/2021 0137   RDW 15.7 (H) 06/14/2021 1509   LYMPHSABS 2.0 06/14/2021 1509   MONOABS 0.7 01/09/2021 1809   EOSABS 0.3 06/14/2021 1509   BASOSABS 0.0 06/14/2021 1509    BMET    Component Value Date/Time   NA 141 06/14/2021 1509   K 4.4 06/14/2021 1509   CL 105 06/14/2021 1509   CO2 20 06/14/2021 1509   GLUCOSE 171 (H) 06/14/2021 1509   GLUCOSE 122 (H) 02/02/2021 0139   BUN 15 06/14/2021 1509   CREATININE 0.97 06/14/2021 1509   CALCIUM 9.5 06/14/2021 1509   GFRNONAA 58 (L) 02/02/2021 0139   GFRAA >60 03/13/2019 1459    BNP    Component Value Date/Time   BNP 146.1 (H) 04/21/2018 1202   BNP 19.6 11/25/2013 1417     Imaging:  No results found.       Latest Ref Rng & Units 09/20/2018   11:02 AM 04/13/2014   11:44 AM  PFT Results  FVC-Pre L 3.53   3.36    FVC-Predicted  Pre % 96   88    FVC-Post L 3.66   3.35    FVC-Predicted Post % 100   88    Pre FEV1/FVC % % 70   68    Post FEV1/FCV % % 70   68    FEV1-Pre L 2.46   2.29    FEV1-Predicted Pre % 92   81    FEV1-Post L 2.55   2.29    DLCO uncorrected ml/min/mmHg 18.77   18.24    DLCO UNC% % 84   71    DLVA Predicted % 83   81    TLC L 5.89   5.13    TLC % Predicted % 97   85    RV % Predicted % 100   99      No results found for: NITRICOXIDE      Assessment & Plan:   Chronic obstructive pulmonary disease, unspecified ([place]) Poorly controlled; although I suspect DOE is multifactorial related to not being on any maintenance inhalers and deconditioning from CABG.  Previously felt like he had more success with Stiolto.  We will restart him on this today.  Provided with samples and teach back performed in office.  Continue as needed albuterol.  Patient Instructions  Continue Albuterol inhaler 2 puffs or 3 mL neb every 6 hours as needed for shortness of breath or wheezing. Notify if symptoms persist despite rescue inhaler/neb use. Continue protonix 40 mg daily   Start Stiolto  2 puffs daily. This is your new maintenance medicine  Flonase nasal spray 2 sprays each nostril daily for runny nose or nasal congestion  Pulmonary function testing - schedule today.   Referral to lung cancer screening program placed today  Follow up in 6 weeks with Dr. Lamonte Sakai. If symptoms do not improve or worsen, please contact office for sooner follow up or seek emergency care.    DOE (dyspnea on exertion) I suspect that most of his DOE is related to deconditioning and being off maintenance inhalers for his COPD.  He does have some history of occupational exposures which put him at increased risk for possible ILD.  He did not have any previous restriction on PFTs 3 years ago.  We will plan to repeat these for further evaluation.  Allergic rhinitis Intermittent symptoms. Recommended he start intranasal steroid for  postnasal drainage control. This could also be contributing to his cough. Consider OTC antihistamine during allergy season.   Former smoker Former heavy smoker. CTA chest from September without any incidental findings of lung nodules. Referred to lung cancer screening program today.  S/P CABG x 2 Generalized fatigue and some progressive DOE since procedure; deconditioning vs poorly controlled COPD. May consider referral to cardiopulmonary rehab if no improvement with initiation of maintenance therapy. Follow up with cardiology as scheduled.    I spent 35 minutes of dedicated to the care of this patient on the date of this encounter to include pre-visit review of records, face-to-face time with the patient discussing conditions above, post visit ordering of testing, clinical documentation with the electronic health record, making appropriate referrals as documented, and communicating necessary findings to members of the patients care team.  Clayton Bibles, NP 09/19/2021  Pt aware and understands NP's role.

## 2021-09-19 NOTE — Assessment & Plan Note (Signed)
I suspect that most of his DOE is related to deconditioning and being off maintenance inhalers for his COPD.  He does have some history of occupational exposures which put him at increased risk for possible ILD.  He did not have any previous restriction on PFTs 3 years ago.  We will plan to repeat these for further evaluation.

## 2021-09-19 NOTE — Patient Instructions (Addendum)
Continue Albuterol inhaler 2 puffs or 3 mL neb every 6 hours as needed for shortness of breath or wheezing. Notify if symptoms persist despite rescue inhaler/neb use. Continue protonix 40 mg daily   Start Stiolto 2 puffs daily. This is your new maintenance medicine  Flonase nasal spray 2 sprays each nostril daily for runny nose or nasal congestion  Pulmonary function testing - schedule today.   Referral to lung cancer screening program placed today  Follow up in 6 weeks with Dr. Lamonte Sakai. If symptoms do not improve or worsen, please contact office for sooner follow up or seek emergency care.

## 2021-09-19 NOTE — Assessment & Plan Note (Signed)
Poorly controlled; although I suspect DOE is multifactorial related to not being on any maintenance inhalers and deconditioning from CABG.  Previously felt like he had more success with Stiolto.  We will restart him on this today.  Provided with samples and teach back performed in office.  Continue as needed albuterol.  Patient Instructions  Continue Albuterol inhaler 2 puffs or 3 mL neb every 6 hours as needed for shortness of breath or wheezing. Notify if symptoms persist despite rescue inhaler/neb use. Continue protonix 40 mg daily   Start Stiolto 2 puffs daily. This is your new maintenance medicine  Flonase nasal spray 2 sprays each nostril daily for runny nose or nasal congestion  Pulmonary function testing - schedule today.   Referral to lung cancer screening program placed today  Follow up in 6 weeks with Dr. Lamonte Sakai. If symptoms do not improve or worsen, please contact office for sooner follow up or seek emergency care.

## 2021-09-19 NOTE — Assessment & Plan Note (Addendum)
>>  ASSESSMENT AND PLAN FOR OSA AND COPD OVERLAP SYNDROME (HCC) WRITTEN ON 04/23/2022  9:44 PM BY Assunta Pupo E, NP  >>ASSESSMENT AND PLAN FOR CHRONIC OBSTRUCTIVE PULMONARY DISEASE, UNSPECIFIED (HCC) WRITTEN ON 09/19/2021  4:06 PM BY COBB, KATHERINE V, NP  Poorly controlled; although I suspect DOE is multifactorial related to not being on any maintenance inhalers and deconditioning from CABG.  Previously felt like he had more success with Stiolto.  We will restart him on this today.  Provided with samples and teach back performed in office.  Continue as needed albuterol.  Patient Instructions  Continue Albuterol inhaler 2 puffs or 3 mL neb every 6 hours as needed for shortness of breath or wheezing. Notify if symptoms persist despite rescue inhaler/neb use. Continue protonix 40 mg daily   Start Stiolto 2 puffs daily. This is your new maintenance medicine  Flonase nasal spray 2 sprays each nostril daily for runny nose or nasal congestion  Pulmonary function testing - schedule today.   Referral to lung cancer screening program placed today  Follow up in 6 weeks with Dr. Delton Coombes. If symptoms do not improve or worsen, please contact office for sooner follow up or seek emergency care.    >>ASSESSMENT AND PLAN FOR DOE (DYSPNEA ON EXERTION) WRITTEN ON 09/19/2021  4:07 PM BY COBB, KATHERINE V, NP  I suspect that most of his DOE is related to deconditioning and being off maintenance inhalers for his COPD.  He does have some history of occupational exposures which put him at increased risk for possible ILD.  He did not have any previous restriction on PFTs 3 years ago.  We will plan to repeat these for further evaluation.

## 2021-09-20 ENCOUNTER — Other Ambulatory Visit (HOSPITAL_BASED_OUTPATIENT_CLINIC_OR_DEPARTMENT_OTHER): Payer: Self-pay | Admitting: Nurse Practitioner

## 2021-09-20 DIAGNOSIS — E1142 Type 2 diabetes mellitus with diabetic polyneuropathy: Secondary | ICD-10-CM

## 2021-09-20 DIAGNOSIS — E1140 Type 2 diabetes mellitus with diabetic neuropathy, unspecified: Secondary | ICD-10-CM

## 2021-09-20 MED ORDER — METFORMIN HCL ER 500 MG PO TB24: 1000.0000 mg | ORAL_TABLET | Freq: Two times a day (BID) | ORAL | 3 refills | Status: DC

## 2021-09-23 ENCOUNTER — Other Ambulatory Visit (HOSPITAL_COMMUNITY): Payer: Self-pay

## 2021-09-26 ENCOUNTER — Other Ambulatory Visit: Payer: Self-pay | Admitting: *Deleted

## 2021-09-26 DIAGNOSIS — E119 Type 2 diabetes mellitus without complications: Secondary | ICD-10-CM

## 2021-09-26 NOTE — Patient Outreach (Signed)
[facility]) Care Management Telephonic RN Care Manager Note   09/26/2021 Name:  Paul [name] MRN:  [id number] DOB:  11-08-1950  Summary: Pt is doing well with no acute issues or events. Pt continue to manager his care however aware he needs to reduce his carbohydrates. Pt remains in the 200's with glucose readings and will have an updated A1C at his next primary care provider's office. Pt reports difficulty at times preparing health meals.  Recommendations/Changes made from today's visit: Will continue to encouraged ongoing adherence with his management of care and the discussed plan of care. Due to pt's glucose and lifestyle with limited cooking offered Mobile Meals (receptive). Referral made to care-guides for meal services. No other needs presented at this time.  Subjective: Paul [name] is an 71 y.o. year old male who is a primary patient of Early, [name], NP. The care management team was consulted for assistance with care management and/or care coordination needs.    Telephonic RN Care Manager completed Telephone Visit today.  Objective:   Medications Reviewed Today     Reviewed by Clayton Bibles, NP (Nurse Practitioner) on 09/19/21 at 1616  Med List Status: <None>   Medication Order Taking? Sig Documenting Provider Last Dose Status Informant  acetaminophen (TYLENOL) 500 MG tablet [id number] Yes Take 1,500-2,000 mg by mouth daily. [provider] Taking Active Self  albuterol (PROVENTIL) (2.5 MG/3ML) 0.083% nebulizer solution [id number] Yes Take 3 mLs by nebulization every 4 (four) hours as needed.  Taking Active   albuterol (PROVENTIL) (2.5 MG/3ML) 0.083% nebulizer solution [id number] Yes Take 3 mLs (2.5 mg total) by nebulization every 4 (four) hours as needed for wheezing or shortness of breath. Orma Render, NP Taking Active   albuterol (VENTOLIN HFA) 108 (90 Base) MCG/ACT inhaler [id number] No Inhale 2 puffs into the lungs every 4 (four) hours as needed.  For breathing.  Patient not taking: Reported on 09/19/2021   Orma Render, NP Not Taking Active   allopurinol (ZYLOPRIM) 100 MG tablet [id number] Yes Take 1 tablet (100 mg total) by mouth daily. Orma Render, NP Taking Active   amLODipine (NORVASC) 5 MG tablet [id number] Yes Take 1 tablet (5 mg total) by mouth daily.  Taking Active   aspirin 81 MG EC tablet [id number] Yes Take 1 tablet (81 mg total) by mouth daily.  Taking Active   benzonatate (TESSALON PERLES) 100 MG capsule [id number] Yes Take 1 capsule (100 mg total) by mouth 3 (three) times daily as needed for cough.  Taking Active   clopidogrel (PLAVIX) 75 MG tablet [id number] Yes Take 1 tablet (75 mg total) by mouth daily. Orma Render, NP Taking Active   fluticasone (FLONASE) 50 MCG/ACT nasal spray [id number] Yes Place 2 sprays into both nostrils daily. Cobb, Karie Schwalbe, NP  Active   furosemide (LASIX) 20 MG tablet [id number] Yes Take 1 tablet (20 mg total) by mouth daily.  Taking Active   gabapentin (NEURONTIN) 300 MG capsule [id number] Yes Take 2 capsules (600 mg total) by mouth 2 (two) times daily. For back and foot pain. Orma Render, NP Taking Active   glucose blood (ONETOUCH VERIO) test strip [id number] Yes Use to check blood glucose 3 times daily  Taking Active   HYDROcodone-acetaminophen (NORCO/VICODIN) 5-325 MG tablet [id number] Yes Take 1 tablet by mouth every 8 (eight) hours as needed for pain  Taking Active   HYDROcodone-acetaminophen (NORCO/VICODIN) 5-325 MG tablet [id number] Yes Take 1 tablet by mouth every 8 (eight) hours as  needed for moderate pain (4-6 on pain scale)  Taking Active   HYDROcodone-acetaminophen (NORCO/VICODIN) 5-325 MG tablet [id number] Yes Take 1 tablet by mouth every 8 (eight) hours as needed for pain. Do not fill before 10/17/21.  Taking Active   HYDROcodone-acetaminophen (NORCO/VICODIN) 5-325 MG tablet [id number] Yes Take 1 tablet by mouth every 8 (eight) hours as needed for pain. Do not fill before 09/18/21.  Taking  Active   insulin detemir (LEVEMIR FLEXTOUCH) 100 UNIT/ML FlexPen [id number] Yes Inject 40 Units into the skin daily. Orma Render, NP Taking Active   insulin lispro (HUMALOG KWIKPEN) 200 UNIT/ML KwikPen [id number] Yes Inject 14 Units into the skin every morning AND 14 Units daily with lunch AND 14Units daily with supper. Orma Render, NP Taking Active   Insulin Pen Needle 32G X 4 MM MISC [id number] Yes Use daily with insulin as directed  Taking Active   menthol-cetylpyridinium (CEPACOL) 3 MG lozenge [id number] Yes Take 1 lozenge (3 mg total) by mouth as needed for sore throat. Elgie Collard, PA-C Taking Active   metFORMIN (GLUCOPHAGE-XR) 500 MG 24 hr tablet [id number] Yes Take 2 tablets (1,000 mg total) by mouth 2 (two) times daily. For Diabetes Early, [name], NP Taking Active   metoprolol tartrate (LOPRESSOR) 25 MG tablet [id number] Yes Take 1 tablet (25 mg total) by mouth 2 (two) times daily. Orma Render, NP Taking Active   nitroGLYCERIN (NITROSTAT) 0.4 MG SL tablet [id number] Yes one tablet under tongue, q5 minutes for chest pain [provider] Taking Active   pantoprazole (PROTONIX) 40 MG tablet [id number] Yes Take 1 tablet (40 mg total) by mouth daily.  Taking Active   polyethylene glycol (MIRALAX / GLYCOLAX) 17 g packet [id number] Yes Take 17 g by mouth daily as needed for mild constipation. [provider] Taking Active Self  rosuvastatin (CRESTOR) 20 MG tablet [id number] Yes Take 0.5 tablets (10 mg total) by mouth daily. Or you may take 1 tablet ('20mg'$ ) every other day if the pill is too small to split. Orma Render, NP Taking Active   tamsulosin (FLOMAX) 0.4 MG CAPS capsule [id number] Yes Take 1 capsule (0.4 mg total) by mouth daily. Elgie Collard, PA-C Taking Active   Tetrahydrozoline HCl Karma Lew Lead [place] [id number] Yes Place 1 drop into both eyes as needed (itching). [provider] Taking Active Self  Tiotropium Bromide-Olodaterol (STIOLTO RESPIMAT) 2.5-2.5 MCG/ACT  AERS [id number] Yes Inhale 2 puffs into the lungs daily. Cobb, Karie Schwalbe, NP  Active   Tiotropium Bromide-Olodaterol (STIOLTO RESPIMAT) 2.5-2.5 MCG/ACT AERS [id number] Yes Inhale 2 puffs into the lungs daily. Cobb, Karie Schwalbe, NP  Active   Vitamin D, Ergocalciferol, (DRISDOL) 1.25 MG (50000 UNIT) CAPS capsule [id number] Yes Take 1 capsule (50,000 Units total) by mouth once a week.  Taking Active   zolpidem (AMBIEN) 10 MG tablet [id number] Yes Take 1 tablet (10 mg total) by mouth at bedtime as needed for sleep Early, [name], NP Taking Active              SDOH:  (Social Determinants of Health) assessments and interventions performed:     Care Plan  Review of patient past medical history, allergies, medications, health status, including review of consultants reports, laboratory and other test data, was performed as part of comprehensive evaluation for care management services.   Care Plan : RN Care Manager Plan of Care  Updates made by Tobi Bastos, RN since 09/26/2021 12:00 AM     Problem: Knowledge Deficit  related to Diabetes and Care Coordination Needs   Priority: High     Long-Range Goal: Development plan of care for management of Diabetes   Start Date: 03/07/2021  Expected End Date: 11/14/2021  This Visit's Progress: On track  Recent Progress: On track  Priority: High  Note:   Current Barriers:  Knowledge Deficits related to plan of care for management of DMII Chronic Disease Management support and education needs related to DMII  RNCM Clinical Goal(s):  Patient will verbalize understanding of plan for management of DMII verbalize basic understanding of  DMII disease process and self health management plan utilizing the discussed plan of care and information provided today. take all medications exactly as prescribed and will call provider for medication related questions attend all scheduled medical appointments: Continuation of monitoring blood sugars and adhering to  diabetic diet . continue to work with RN Care Manager to address care management and care coordination needs related to  DMII through collaboration with RN Care manager, provider, and care team.   Interventions: Inter-disciplinary care team collaboration (see longitudinal plan of care) Evaluation of current treatment plan related to  self management and patient's adherence to plan as established by provider  Diabetes Interventions: Assessed patient's understanding of A1c goal: <6.5% Provided education to patient about basic DM disease process; Counseled on importance of regular laboratory monitoring as prescribed; Advised patient, providing education and rationale, to check cbg TID and record, calling primary provider for findings outside established parameters; Lab Results  Component Value Date   HGBA1C 8.8 (H) 01/21/2021  Update 12/20:Pt reports his A1C should be lower with the recent changes in he dietary habits. Pt states a recent visit with his primary provider to concurs with the expectance of a improved readings. Pt continue to manage his diabetes with baseline readings and no experience of hypo-hyperglycemia readouts. Will continue review of the above goals and interventions and encouraged adherence to all discussed.  1/18- Pt reports issues with one of his medications Levemir. States pharmacy indicates back order and last dose was from his provider's office. Pt only has 4 dose/days left and needing a refill however having difficulty getting through to his provider. RN offered to intervene and was able to call and leave Dr. Shon Baton nurse a voice message. Will follow up over the next few weeks concerning any interventions provided by the provider's office. Pt aware to call RN back if no call back from the provider's office over the next few days. Pt reports his CBG have been fluctuating from 200-300 with some days around 160-180. No other issues related to medications. Pt states he has not  had another A1C that he is aware of since Oct. Will continue to reiterated on the interventions and reinforce better eating habits along with increase mobility for improving this daily glucose levels.  2/10 Pt reports issues with receiving his insulin from his local pharmacy [facility]) reporting limited supplies and referring pt to go to [facility] who is requesting pt cover the entire amount of his 4 pens for 90 day supply. Pt unable to afford and move to [place] for all his medications moving forward. Pt was having CBG readings around 209 with this incident while not receiving the full coverage of insulin but reports today's with the new pharmacy and receival of medications CBG at 176-187 asymptomatic. Discussed healthier eating habits and confirmed no high or low readings have been experienced. Pt states his son will be working on his medication management this weekend. RN  offered [place] to intervene if no resolution or pt remains confused with his medication management. RN also encourage pt to reach out to his provider on any other issues related to his medications. Provided RN contact number and offered to follow up in a few weeks for an update on his ongoing management of care related to his medications.   3/14 Update: Pt remains on track and reports his last CBG was around 200 and he is aware of the elevate (dietary related). RN reiterated on a healthier diet and stress the importance of reducing his blood glucose levels to avoid the risk of other issues that may occur as a result of elevated glucose levels. Pt verbalized he is aware and will get his family member to obtain his prescribed glucose strips for continuous monitoring. Discussed recent A1C has reduced to 7.9 on 2/28  from 8.8 in Oct. Praise pt for this success and will continue to advocate for reducing this value to again lower the risk. Pt reports he has been checking his sore feet (history) with no sores present. Pt has changed  provider and pharmacy which have been noted in [place].  Other issues mentioned today by pt related to ongoing arthritis all over as pt has consult his providers with limited resolutions due to being a diabetic and unable to receive Prednisone shots. Encouraged ROM exercises prior to ambulating in the morning along with hand exercises for improving his blood flow and to use his assisted device at all times when ambulating to prevent risk for falls or injuries (pt receptive).   08/04/2021 Update: Pt states he is pending a new glucometer his recent device is no long working. States he is sure his readings have improved with the most recent changes but not exactly sure until his gets his new device. Reports ongoing adherence with all he appointments with his new provider Jacolyn Reedy NP with [facility]. No changes in his medication and pt continues to have sufficient transportation at this time. Strongly encourage to obtain his glucometer for his ongoing glucose readings and encouraged pt to call this RN case management with any delays or obstacles in obtaining this device. Plan of care discussed with encouragement to continue adherence with all discussed today. No other inquires or questions at this time.   09/02/2021 Update: Pt reports he has not obtained a glucometer and needs help getting one. RN will refer to a [place] to assist with this process. Pt states he usually is aware when his BS are to high or low but again does not have a working glucometer to verify numbers. Pt denies any related symptoms and no other needs presented at this time. Pt was suppose to reach out to his provider and allow his friend to call with this requested however pt has not received a glucometer. Will follow up in a few weeks to inquire further on the process of obtaining a glucometer via [place]. Plan of care discussed and pt encouraged to continue to adhere to all discussed. No other needs presented at  this time.  09/26/2021-Update: Pt reports he continue do well with no immediate needs. Discussed the current plan of care related to A1C currently at 7.9 in Feb. Pt will follow up on next primary visit with an update on his A1C. Pt aware he can improve his dietary habits as RN educated further on reduce his carbohydrates and discuss some of those items and how they can affect his glucose levels.  Pt verbalized an understanding of the importance of a healthier diet as a prevention measures related to infections and other medical conditions from occurring. Report most readings are around the 260 range as a fasting BS. Pt states he was previously at 300 however much improved over the last month. No issues with medications or getting to all his appointments.  Pt receptive to [employer] as receptive to RN make a referral to the care-guides to assist further with these needs.   Diabetes Interventions:  (Status:  Goal on track:  Yes.) Long Term Goal Assessed patient's understanding of A1c goal: <6.5% Provided education to patient about basic DM disease process Reviewed medications with patient and discussed importance of medication adherence Discussed plans with patient for ongoing care management follow up and provided patient with direct contact information for care management team Advised patient, providing education and rationale, to check cbg as prescribed and record, calling provider  for findings outside established parameters Lab Results  Component Value Date   HGBA1C 7.9 (H) 06/14/2021  Patient Goals/Self-Care Activities: Patient will self administer medications as prescribed Patient will attend all scheduled provider appointments Patient will call pharmacy for medication refills Patient will attend church or other social activities Patient will continue to perform ADL's independently Patient will continue to perform IADL's independently Patient will call provider office for new concerns or  questions  Follow Up Plan:  Telephone follow up appointment with care management team member scheduled for:  July 2023 The patient has been provided with contact information for the care management team and has been advised to call with any health related questions or concerns.        [name], RN Care Management Coordinator [place] Office [phone]

## 2021-09-27 ENCOUNTER — Telehealth: Payer: Self-pay

## 2021-09-27 ENCOUNTER — Telehealth (HOSPITAL_BASED_OUTPATIENT_CLINIC_OR_DEPARTMENT_OTHER): Payer: Self-pay | Admitting: Nurse Practitioner

## 2021-09-27 NOTE — Telephone Encounter (Signed)
   Telephone encounter was:  Unsuccessful.  09/27/2021 Name: Paul [name] MRN: [id number] DOB: 1950-11-09  Unsuccessful outbound call made today to assist with:  Food Insecurity  Outreach Attempt:  1st Attempt  A HIPAA compliant voice message was left requesting a return call.  Instructed patient to call back at [phone] at their earliest convenience.  [place] management  [place], [place]  Main Phone: [phone]  E-mail: [email or web address]  Website: [email or web address]

## 2021-09-27 NOTE — Telephone Encounter (Signed)
Received paperwork for office notes and medication list be sent to them for pt to receive the [place] 2 reader and supplies sent to him. Needing provider signature on it first after this has been completed please let Medical records know so we can fax this paper and send off notes and medications for pt. Paperwork is in provider signature tray in the back.  Please advise.

## 2021-09-28 ENCOUNTER — Telehealth: Payer: Self-pay

## 2021-09-28 NOTE — Telephone Encounter (Signed)
   Telephone encounter was:  Successful.  09/28/2021 Name: Paul [name] MRN: [id number] DOB: 1951/02/28  Paul [name] is a 71 y.o. year old male who is a primary care patient of Early, [name], NP . The community resource team was consulted for assistance with [place] guide performed the following interventions: Patient provided with information about care guide support team and interviewed to confirm resource needs.Pt advised food resources for pick-ups and delivery are needed at this time.  Follow Up Plan:  Care guide will outreach resources to assist patient with food  [place], [place]  Main Phone: [phone]  E-mail: [email or web address]  Website: [email or web address]

## 2021-10-03 NOTE — Telephone Encounter (Signed)
Company called regarding paperwork being filled out and faxed. Let them know our policy for paperwork to be filled out is 7 to 10 bushiness days. They stated it has a due date and that it is over the due date. I let them know we have a policy for paperwork and that the provider is out of the office today 6/19 and wont be back until 6/21. Needing update on this paperwork Please advise.

## 2021-10-04 ENCOUNTER — Telehealth: Payer: Self-pay

## 2021-10-04 NOTE — Telephone Encounter (Signed)
   Telephone encounter was:  Successful.  10/04/2021 Name: Paul [name] MRN: [id number] DOB: 06-Oct-1950  Paul [name] is a 71 y.o. year old male who is a primary care patient of Early, [name], NP . The community resource team was consulted for assistance with [place] guide performed the following interventions: Follow up call placed to the patient to discuss status of referral. [employer]OWs coordinator advised CG that pt was not on their waiting list. CG called pt to retrieve answers for [employer]OW[employer] application. Information sent to [facility] coordinator.  Follow Up Plan:  Care guide will follow up with patient by phone over the next few days to ensure food resources have been received.  [place] management  [place], [place]  Main Phone: [phone]  E-mail: [email or web address]  Website: [email or web address]

## 2021-10-04 NOTE — Telephone Encounter (Signed)
  Mail encounter was:  Successful.  10/04/2021 Name: Hasani Diemer MRN: [id number] DOB: 04-05-1951  Devarious Pavek is a 71 y.o. year old male who is a primary care patient of Early, [name], NP . The community resource team was consulted for assistance with [place] guide performed the following interventions:  CG sending resources for food insecurity . Mail enclosed: [employer], Advice worker, Tour manager, and [employer].  CG has also reach out to [employer] and awaiting a response to see if pt is on waiting list or needs to apply.  Follow Up Plan:  Care guide will follow up with patient by phone over the next few days to ensure mail has been received.  [place] management  [place], [place]  Main Phone: [phone]  E-mail: [email or web address]  Website: [email or web address]

## 2021-10-05 ENCOUNTER — Other Ambulatory Visit: Payer: Self-pay | Admitting: *Deleted

## 2021-10-05 NOTE — Patient Outreach (Signed)
[facility]) Care Management Telephonic RN Care Manager Note   10/05/2021 Name:  Paul [name] MRN:  [id number] DOB:  09/09/1950  Summary: Pt received call for meal services and application process started. Pt states CBG improving. Pt is managing his diabetes based upon the tools provided via [facility]. Pt aware to reach to his provider with any concerning or ongoing issues.  Recommendations/Changes made from today's visit: Based upon pt's progress and self management of care will close this case. Goals met case closure.  Subjective: Paul [name] is an 71 y.o. year old male who is a primary patient of Early, [name], NP. The care management team was consulted for assistance with care management and/or care coordination needs.    Telephonic RN Care Manager completed Telephone Visit today.  Objective:   Medications Reviewed Today     Reviewed by Clayton Bibles, NP (Nurse Practitioner) on 09/19/21 at 1616  Med List Status: <None>   Medication Order Taking? Sig Documenting Provider Last Dose Status Informant  acetaminophen (TYLENOL) 500 MG tablet [id number] Yes Take 1,500-2,000 mg by mouth daily. [provider] Taking Active Self  albuterol (PROVENTIL) (2.5 MG/3ML) 0.083% nebulizer solution [id number] Yes Take 3 mLs by nebulization every 4 (four) hours as needed.  Taking Active   albuterol (PROVENTIL) (2.5 MG/3ML) 0.083% nebulizer solution [id number] Yes Take 3 mLs (2.5 mg total) by nebulization every 4 (four) hours as needed for wheezing or shortness of breath. Orma Render, NP Taking Active   albuterol (VENTOLIN HFA) 108 (90 Base) MCG/ACT inhaler [id number] No Inhale 2 puffs into the lungs every 4 (four) hours as needed. For breathing.  Patient not taking: Reported on 09/19/2021   Orma Render, NP Not Taking Active   allopurinol (ZYLOPRIM) 100 MG tablet [id number] Yes Take 1 tablet (100 mg total) by mouth daily. Orma Render, NP Taking Active   amLODipine (NORVASC) 5 MG  tablet [id number] Yes Take 1 tablet (5 mg total) by mouth daily.  Taking Active   aspirin 81 MG EC tablet [id number] Yes Take 1 tablet (81 mg total) by mouth daily.  Taking Active   benzonatate (TESSALON PERLES) 100 MG capsule [id number] Yes Take 1 capsule (100 mg total) by mouth 3 (three) times daily as needed for cough.  Taking Active   clopidogrel (PLAVIX) 75 MG tablet [id number] Yes Take 1 tablet (75 mg total) by mouth daily. Orma Render, NP Taking Active   fluticasone (FLONASE) 50 MCG/ACT nasal spray [id number] Yes Place 2 sprays into both nostrils daily. Cobb, Karie Schwalbe, NP  Active   furosemide (LASIX) 20 MG tablet [id number] Yes Take 1 tablet (20 mg total) by mouth daily.  Taking Active   gabapentin (NEURONTIN) 300 MG capsule [id number] Yes Take 2 capsules (600 mg total) by mouth 2 (two) times daily. For back and foot pain. Orma Render, NP Taking Active   glucose blood (ONETOUCH VERIO) test strip [id number] Yes Use to check blood glucose 3 times daily  Taking Active   HYDROcodone-acetaminophen (NORCO/VICODIN) 5-325 MG tablet [id number] Yes Take 1 tablet by mouth every 8 (eight) hours as needed for pain  Taking Active   HYDROcodone-acetaminophen (NORCO/VICODIN) 5-325 MG tablet [id number] Yes Take 1 tablet by mouth every 8 (eight) hours as needed for moderate pain (4-6 on pain scale)  Taking Active   HYDROcodone-acetaminophen (NORCO/VICODIN) 5-325 MG tablet [id number] Yes Take 1 tablet by mouth every 8 (eight) hours as needed for pain. Do not fill before 10/17/21.  Taking Active  HYDROcodone-acetaminophen (NORCO/VICODIN) 5-325 MG tablet [id number] Yes Take 1 tablet by mouth every 8 (eight) hours as needed for pain. Do not fill before 09/18/21.  Taking Active   insulin detemir (LEVEMIR FLEXTOUCH) 100 UNIT/ML FlexPen [id number] Yes Inject 40 Units into the skin daily. Orma Render, NP Taking Active   insulin lispro (HUMALOG KWIKPEN) 200 UNIT/ML KwikPen [id number] Yes Inject 14 Units into the skin every morning  AND 14 Units daily with lunch AND 14Units daily with supper. Orma Render, NP Taking Active   Insulin Pen Needle 32G X 4 MM MISC [id number] Yes Use daily with insulin as directed  Taking Active   menthol-cetylpyridinium (CEPACOL) 3 MG lozenge [id number] Yes Take 1 lozenge (3 mg total) by mouth as needed for sore throat. Elgie Collard, PA-C Taking Active   metFORMIN (GLUCOPHAGE-XR) 500 MG 24 hr tablet [id number] Yes Take 2 tablets (1,000 mg total) by mouth 2 (two) times daily. For Diabetes Early, [name], NP Taking Active   metoprolol tartrate (LOPRESSOR) 25 MG tablet [id number] Yes Take 1 tablet (25 mg total) by mouth 2 (two) times daily. Orma Render, NP Taking Active   nitroGLYCERIN (NITROSTAT) 0.4 MG SL tablet [id number] Yes one tablet under tongue, q5 minutes for chest pain [provider] Taking Active   pantoprazole (PROTONIX) 40 MG tablet [id number] Yes Take 1 tablet (40 mg total) by mouth daily.  Taking Active   polyethylene glycol (MIRALAX / GLYCOLAX) 17 g packet [id number] Yes Take 17 g by mouth daily as needed for mild constipation. [provider] Taking Active Self  rosuvastatin (CRESTOR) 20 MG tablet [id number] Yes Take 0.5 tablets (10 mg total) by mouth daily. Or you may take 1 tablet (41m) every other day if the pill is too small to split. EOrma Render NP Taking Active   tamsulosin (FLOMAX) 0.4 MG CAPS capsule [id number] Take 1 capsule (0.4 mg total) by mouth daily. CElgie Collard PA-C Taking Active   Tetrahydrozoline HCl (Karma Lew[place]rum[place]OP)[id number] Place 1 drop into both eyes as needed (itching). [provider] Taking Active Self  Tiotropium Bromide-Olodaterol (STIOLTO RESPIMAT) 2.5-2.5 MCG/ACT AERS [id number] Inhale 2 puffs into the lungs daily. Cobb, KKarie Schwalbe NP  Active   Tiotropium Bromide-Olodaterol (STIOLTO RESPIMAT) 2.5-2.5 MCG/ACT AERS [id number] Inhale 2 puffs into the lungs daily. Cobb, KKarie Schwalbe NP  Active   Vitamin D,  Ergocalciferol, (DRISDOL) 1.25 MG (50000 UNIT) CAPS capsule [id number] Take 1 capsule (50,000 Units total) by mouth once a week.  Taking Active   zolpidem (AMBIEN) 10 MG tablet [id number] Take 1 tablet (10 mg total) by mouth at bedtime as needed for sleep Early, SCoralee Pesa NP Taking Active              SDOH:  (Social Determinants of Health) assessments and interventions performed:     Care Plan  Review of patient past medical history, allergies, medications, health status, including review of consultants reports, laboratory and other test data, was performed as part of comprehensive evaluation for care management services.   Care Plan : RN Care Manager Plan of Care  Updates made by MTobi Bastos RN since 10/05/2021 12:00 AM     Problem: Knowledge Deficit related to Diabetes and Care Coordination Needs   Priority: High     Long-Range Goal: Development plan of care for management of Diabetes Completed 10/05/2021  Start Date: 03/07/2021  Expected End Date: 11/14/2021  This Visit's Progress: On track  Recent Progress: On track  Priority: High  Note:   Current Barriers:  Knowledge Deficits related to plan of care for management of DMII Chronic Disease Management support and education needs related to DMII  RNCM Clinical Goal(s):  Patient will verbalize understanding of plan for management of DMII verbalize basic understanding of  DMII disease process and self health management plan utilizing the discussed plan of care and information provided today. take all medications exactly as prescribed and will call provider for medication related questions attend all scheduled medical appointments: Continuation of monitoring blood sugars and adhering to diabetic diet . continue to work with RN Care Manager to address care management and care coordination needs related to  DMII through collaboration with RN Care manager, provider, and care team.   Interventions: Inter-disciplinary  care team collaboration (see longitudinal plan of care) Evaluation of current treatment plan related to  self management and patient's adherence to plan as established by provider  Diabetes Interventions: Assessed patient's understanding of A1c goal: <6.5% Provided education to patient about basic DM disease process; Counseled on importance of regular laboratory monitoring as prescribed; Advised patient, providing education and rationale, to check cbg TID and record, calling primary provider for findings outside established parameters; Lab Results  Component Value Date   HGBA1C 8.8 (H) 01/21/2021  Update 12/20:Pt reports his A1C should be lower with the recent changes in he dietary habits. Pt states a recent visit with his primary provider to concurs with the expectance of a improved readings. Pt continue to manage his diabetes with baseline readings and no experience of hypo-hyperglycemia readouts. Will continue review of the above goals and interventions and encouraged adherence to all discussed.  1/18- Pt reports issues with one of his medications Levemir. States pharmacy indicates back order and last dose was from his provider's office. Pt only has 4 dose/days left and needing a refill however having difficulty getting through to his provider. RN offered to intervene and was able to call and leave Dr. Shon Baton nurse a voice message. Will follow up over the next few weeks concerning any interventions provided by the provider's office. Pt aware to call RN back if no call back from the provider's office over the next few days. Pt reports his CBG have been fluctuating from 200-300 with some days around 160-180. No other issues related to medications. Pt states he has not had another A1C that he is aware of since Oct. Will continue to reiterated on the interventions and reinforce better eating habits along with increase mobility for improving this daily glucose levels.  2/10 Pt reports issues with  receiving his insulin from his local pharmacy [facility]) reporting limited supplies and referring pt to go to [facility] who is requesting pt cover the entire amount of his 4 pens for 90 day supply. Pt unable to afford and move to [place] for all his medications moving forward. Pt was having CBG readings around 209 with this incident while not receiving the full coverage of insulin but reports today's with the new pharmacy and receival of medications CBG at 176-187 asymptomatic. Discussed healthier eating habits and confirmed no high or low readings have been experienced. Pt states his son will be working on his medication management this weekend. RN offered [place] to intervene if no resolution or pt remains confused with his medication management. RN also encourage pt to reach out to his provider on any other issues related to his medications. Provided RN contact number and offered to follow  up in a few weeks for an update on his ongoing management of care related to his medications.   3/14 Update: Pt remains on track and reports his last CBG was around 200 and he is aware of the elevate (dietary related). RN reiterated on a healthier diet and stress the importance of reducing his blood glucose levels to avoid the risk of other issues that may occur as a result of elevated glucose levels. Pt verbalized he is aware and will get his family member to obtain his prescribed glucose strips for continuous monitoring. Discussed recent A1C has reduced to 7.9 on 2/28  from 8.8 in Oct. Praise pt for this success and will continue to advocate for reducing this value to again lower the risk. Pt reports he has been checking his sore feet (history) with no sores present. Pt has changed provider and pharmacy which have been noted in [place].  Other issues mentioned today by pt related to ongoing arthritis all over as pt has consult his providers with limited resolutions due to being a diabetic and unable to receive  Prednisone shots. Encouraged ROM exercises prior to ambulating in the morning along with hand exercises for improving his blood flow and to use his assisted device at all times when ambulating to prevent risk for falls or injuries (pt receptive).   08/04/2021 Update: Pt states he is pending a new glucometer his recent device is no long working. States he is sure his readings have improved with the most recent changes but not exactly sure until his gets his new device. Reports ongoing adherence with all he appointments with his new provider Jacolyn Reedy NP with [facility] at [facility]. No changes in his medication and pt continues to have sufficient transportation at this time. Strongly encourage to obtain his glucometer for his ongoing glucose readings and encouraged pt to call this RN case management with any delays or obstacles in obtaining this device. Plan of care discussed with encouragement to continue adherence with all discussed today. No other inquires or questions at this time.   09/02/2021 Update: Pt reports he has not obtained a glucometer and needs help getting one. RN will refer to a [place] to assist with this process. Pt states he usually is aware when his BS are to high or low but again does not have a working glucometer to verify numbers. Pt denies any related symptoms and no other needs presented at this time. Pt was suppose to reach out to his provider and allow his friend to call with this requested however pt has not received a glucometer. Will follow up in a few weeks to inquire further on the process of obtaining a glucometer via [place]. Plan of care discussed and pt encouraged to continue to adhere to all discussed. No other needs presented at this time.  09/26/2021-Update: Pt reports he continue do well with no immediate needs. Discussed the current plan of care related to A1C currently at 7.9 in Feb. Pt will follow up on next primary visit with an update on his A1C.  Pt aware he can improve his dietary habits as RN educated further on reduce his carbohydrates and discuss some of those items and how they can affect his glucose levels. Pt verbalized an understanding of the importance of a healthier diet as a prevention measures related to infections and other medical conditions from occurring. Report most readings are around the 260 range as a fasting BS. Pt states he was previously at  300 however much improved over the last month. No issues with medications or getting to all his appointments.  Pt receptive to [employer] as receptive to RN make a referral to the care-guides to assist further with these needs.   Diabetes Interventions:  (Status:  Goal on track:  Yes.) Long Term Goal Assessed patient's understanding of A1c goal: <6.5% Provided education to patient about basic DM disease process Reviewed medications with patient and discussed importance of medication adherence Discussed plans with patient for ongoing care management follow up and provided patient with direct contact information for care management team Advised patient, providing education and rationale, to check cbg as prescribed and record, calling provider  for findings outside established parameters Lab Results  Component Value Date   HGBA1C 7.9 (H) 06/14/2021  Patient Goals/Self-Care Activities: Patient will self administer medications as prescribed Patient will attend all scheduled provider appointments Patient will call pharmacy for medication refills Patient will attend church or other social activities Patient will continue to perform ADL's independently Patient will continue to perform IADL's independently Patient will call provider office for new concerns or questions  Follow Up Plan:  Telephone follow up appointment with care management team member scheduled for:  July 2023 The patient has been provided with contact information for the care management team and has been advised to  call with any health related questions or concerns.    Case closure as pt continue to manage his diabetes. States application process has started with mobile measures and CBG continue to improve. Pt feels comfortable managing his ongoing care.     [name], RN Care Management Coordinator [place] Office [phone]

## 2021-10-05 NOTE — Telephone Encounter (Signed)
Company called again regarding forms. Provider just came back into the office. Company still made aware that we have 7-10 business days to fill out paperwork. Please advise.

## 2021-10-10 NOTE — Telephone Encounter (Signed)
Needing up date on paperwork. Please advise.

## 2021-10-12 ENCOUNTER — Other Ambulatory Visit (HOSPITAL_COMMUNITY): Payer: Self-pay

## 2021-10-17 ENCOUNTER — Other Ambulatory Visit (HOSPITAL_COMMUNITY): Payer: Self-pay

## 2021-10-17 DIAGNOSIS — M5416 Radiculopathy, lumbar region: Secondary | ICD-10-CM | POA: Diagnosis not present

## 2021-10-19 ENCOUNTER — Telehealth: Payer: Self-pay

## 2021-10-19 NOTE — Telephone Encounter (Signed)
   Telephone encounter was:  Unsuccessful.  10/19/2021 Name: Paul [name] MRN: [id number] DOB: 02/10/1951  Unsuccessful outbound call made today to assist with:  Food Insecurity  Outreach Attempt:  1st Attempt f/u  A HIPAA compliant voice message was left requesting a return call.  Instructed patient to call back at [phone] at their earliest convenience.  [place] management  [place], [place]  Main Phone: [phone]  E-mail: [email or web address]  Website: [email or web address]

## 2021-10-20 ENCOUNTER — Telehealth: Payer: Self-pay

## 2021-10-20 ENCOUNTER — Other Ambulatory Visit (HOSPITAL_COMMUNITY): Payer: Self-pay

## 2021-10-20 NOTE — Telephone Encounter (Signed)
   Telephone encounter was:  Unsuccessful.  10/20/2021 Name: Paul [name] MRN: [id number] DOB: [birth year]  Unsuccessful outbound call made today to assist with:  Food Insecurity  Outreach Attempt:  2nd Attempt f/u  A HIPAA compliant voice message was left requesting a return call.  Instructed patient to call back at [phone] at their earliest convenience.   [place] management  [place], [place]  Main Phone: [phone]  E-mail: [email or web address]  Website: [email or web address]

## 2021-10-20 NOTE — Telephone Encounter (Signed)
   Telephone encounter was:  Successful.  10/20/2021 Name: Hallis Meditz MRN: [id number] DOB: 02/11/1951  Mell Guia is a 71 y.o. year old male who is a primary care patient of Early, [name], NP . The community resource team was consulted for assistance with [place] guide performed the following interventions: Discussed resources to assist with Food resources . Patient advised he did not receive the resources in the mail yet and that he will keep looking. CG will re-send information.  Follow Up Plan:  Care guide will follow up with patient by phone over the next few days to confirm mail has been received.   [place] management  [place], [place]  Main Phone: [phone]  E-mail: [email or web address]  Website: [email or web address]

## 2021-10-21 ENCOUNTER — Other Ambulatory Visit (HOSPITAL_COMMUNITY): Payer: Self-pay

## 2021-10-25 ENCOUNTER — Telehealth: Payer: Self-pay

## 2021-10-26 ENCOUNTER — Ambulatory Visit: Payer: Medicare Other | Admitting: *Deleted

## 2021-10-26 ENCOUNTER — Encounter (HOSPITAL_COMMUNITY): Payer: Self-pay

## 2021-10-26 ENCOUNTER — Telehealth: Payer: Self-pay

## 2021-10-26 ENCOUNTER — Emergency Department (HOSPITAL_COMMUNITY)
Admission: EM | Admit: 2021-10-26 | Discharge: 2021-10-26 | Discharge status: Against Medical Advice | Payer: Medicare Other | Attending: Student | Admitting: Student

## 2021-10-26 ENCOUNTER — Other Ambulatory Visit: Payer: Self-pay

## 2021-10-26 ENCOUNTER — Ambulatory Visit (HOSPITAL_BASED_OUTPATIENT_CLINIC_OR_DEPARTMENT_OTHER): Payer: Medicare Other | Admitting: Nurse Practitioner

## 2021-10-26 DIAGNOSIS — R531 Weakness: Secondary | ICD-10-CM | POA: Insufficient documentation

## 2021-10-26 DIAGNOSIS — R739 Hyperglycemia, unspecified: Secondary | ICD-10-CM | POA: Diagnosis not present

## 2021-10-26 DIAGNOSIS — Z5321 Procedure and treatment not carried out due to patient leaving prior to being seen by health care provider: Secondary | ICD-10-CM | POA: Diagnosis not present

## 2021-10-26 DIAGNOSIS — I959 Hypotension, unspecified: Secondary | ICD-10-CM | POA: Diagnosis not present

## 2021-10-26 DIAGNOSIS — I10 Essential (primary) hypertension: Secondary | ICD-10-CM | POA: Diagnosis not present

## 2021-10-26 LAB — CBC
HCT: 36 % — ABNORMAL LOW (ref 39.0–52.0)
Hemoglobin: 10.5 g/dL — ABNORMAL LOW (ref 13.0–17.0)
MCH: 23.6 pg — ABNORMAL LOW (ref 26.0–34.0)
MCHC: 29.2 g/dL — ABNORMAL LOW (ref 30.0–36.0)
MCV: 80.9 fL (ref 80.0–100.0)
Platelets: 165 10*3/uL (ref 150–400)
RBC: 4.45 MIL/uL (ref 4.22–5.81)
RDW: 15.3 % (ref 11.5–15.5)
WBC: 6.6 10*3/uL (ref 4.0–10.5)
nRBC: 0 % (ref 0.0–0.2)

## 2021-10-26 LAB — BASIC METABOLIC PANEL
Anion gap: 8 (ref 5–15)
BUN: 14 mg/dL (ref 8–23)
CO2: 20 mmol/L — ABNORMAL LOW (ref 22–32)
Calcium: 8.8 mg/dL — ABNORMAL LOW (ref 8.9–10.3)
Chloride: 107 mmol/L (ref 98–111)
Creatinine, Ser: 0.88 mg/dL (ref 0.61–1.24)
GFR, Estimated: >60 mL/min (ref >60)
Glucose, Bld: 252 mg/dL — ABNORMAL HIGH (ref 70–99)
Potassium: 4.3 mmol/L (ref 3.5–5.1)
Sodium: 135 mmol/L (ref 135–145)

## 2021-10-26 NOTE — ED Notes (Signed)
Pt left due to long wait times. Iv has been removed.

## 2021-10-26 NOTE — ED Triage Notes (Signed)
Generalized weakness for awhile but legs gave o ut today in his house so called 911.  Seeing a doc at [place] for these same symptoms for awhile.  Mild hypotension 98/50 CBG 329 20g LFA was given 300cc  NS and BP 104/60

## 2021-10-26 NOTE — ED Provider Triage Note (Signed)
Emergency Medicine Provider Triage Evaluation Note  Eivin Mascio , a 71 y.o. male  was evaluated in triage.  Patient complains of feeling weak for the past few days.  Feels as though his legs are giving out from under him.  Has not been sick recently.  Review of Systems  Positive: Weakness Negative: Nausea, vomiting, fever, diarrhea, vomiting Physical Exam  BP 114/63 (BP Location: Left Arm)   Pulse 72   Temp 98.4 F (36.9 C) (Oral)   Resp 17   Ht '5\' 5"'$  (1.651 m)   Wt 83.9 kg   SpO2 90%   BMI 30.79 kg/m  Gen:   Awake, no distress   Resp:  Normal effort  MSK:   Moves extremities without difficulty  Other:  Pale, diaphoretic vs unkempt  Medical Decision Making  Medically screening exam initiated at 11:20 AM.  Appropriate orders placed.  Aleksi Brummet was informed that the remainder of the evaluation will be completed by another provider, this initial triage assessment does not replace that evaluation, and the importance of remaining in the ED until their evaluation is complete.     Rhae Hammock, PA-C 10/26/21 1121

## 2021-10-28 ENCOUNTER — Ambulatory Visit (INDEPENDENT_AMBULATORY_CARE_PROVIDER_SITE_OTHER): Payer: Medicare Other | Admitting: Nurse Practitioner

## 2021-10-28 ENCOUNTER — Encounter (HOSPITAL_BASED_OUTPATIENT_CLINIC_OR_DEPARTMENT_OTHER): Payer: Self-pay | Admitting: Nurse Practitioner

## 2021-10-28 ENCOUNTER — Encounter: Payer: Self-pay | Admitting: Nurse Practitioner

## 2021-10-28 VITALS — BP 102/52 | HR 68 | Temp 97.6°F | Resp 16 | Ht 65.0 in | Wt 184.4 lb

## 2021-10-28 DIAGNOSIS — I25118 Atherosclerotic heart disease of native coronary artery with other forms of angina pectoris: Secondary | ICD-10-CM

## 2021-10-28 DIAGNOSIS — Z794 Long term (current) use of insulin: Secondary | ICD-10-CM | POA: Diagnosis not present

## 2021-10-28 DIAGNOSIS — E1165 Type 2 diabetes mellitus with hyperglycemia: Secondary | ICD-10-CM | POA: Diagnosis not present

## 2021-10-28 DIAGNOSIS — E785 Hyperlipidemia, unspecified: Secondary | ICD-10-CM | POA: Diagnosis not present

## 2021-10-28 DIAGNOSIS — I152 Hypertension secondary to endocrine disorders: Secondary | ICD-10-CM

## 2021-10-28 DIAGNOSIS — I509 Heart failure, unspecified: Secondary | ICD-10-CM

## 2021-10-28 DIAGNOSIS — E1142 Type 2 diabetes mellitus with diabetic polyneuropathy: Secondary | ICD-10-CM

## 2021-10-28 DIAGNOSIS — E0865 Diabetes mellitus due to underlying condition with hyperglycemia: Secondary | ICD-10-CM

## 2021-10-28 DIAGNOSIS — E1159 Type 2 diabetes mellitus with other circulatory complications: Secondary | ICD-10-CM

## 2021-10-28 DIAGNOSIS — J449 Chronic obstructive pulmonary disease, unspecified: Secondary | ICD-10-CM | POA: Diagnosis not present

## 2021-10-28 DIAGNOSIS — R531 Weakness: Secondary | ICD-10-CM

## 2021-10-28 NOTE — Patient Instructions (Addendum)
I want you to STOP taking the amlodipine - this medication is for blood pressure.  Your blood pressures are low and this could be causing some of the problems.   If your blood sugar is more than 200 before you eat, I want you to take 16 clicks If it is less than 638- only take 14 clicks  If after 3 days of using 16 clicks your blood sugar is still high, I want you to call me.  Call the office and PRESS option 1 and you will speak with Judson Roch.   I am going to call you on Monday and see how you are feeling.

## 2021-10-28 NOTE — Progress Notes (Signed)
Orma Render, DNP, AGNP-c [facility] [address]  [place], [place] [address] [phone]  Subjective:   Paul [name] is a 71 y.o. male presents to day for  Weakness and Fatigue .  Tells me that he feels very tired all the time and is unable to do many of the things that he has done in the past.  He endorses shortness of breath with exertion and some dizziness as well.  He also tells me that he feels he is quite forgetful.  He recently passed out on Tuesday and fell.  He did present to the emergency room but waited for many hours and tells me that he finally left without being seen.  He does tell me that he had some confusion immediately prior to passing out.  He has not had any more instances of loss of consciousness. He denies chest pain, palpitations, headaches, vision changes.  He tells me he is drinking plenty of water and Sprite.  He has limited amounts of alcohol on rare occasions. Diabetes He tells me that his blood sugars have been elevated into the 300s.  He is also experiencing symptoms of neuropathy in both of his feet.  He tells me that he is trying to monitor his diet and has been taking his medication. He is not endorses "40 clicks" of the "more potent" insulin in the morning and "14 clicks" of the "less potent" insulin at breakfast lunch and dinner.  He also reports he will occasionally repeat the "14 clicks of less potent insulin at bedtime". He denies any low blood sugar readings. He does tell me that he is urinating approximately 2 urinals full of urine throughout the night. Dark stools He endorses noticing very dark/black stools however no bright red blood present.  He tells me he has no other signs of bleeding or excessive bruising.  He has no abdominal pain, nausea, vomiting. Right leg pain He endorses pain in the right thigh that shoots all the way down to the foot laterally.  PMH, Medications, and Allergies reviewed  and updated in chart.   ROS negative except for what is listed in HPI. Objective:  BP (!) 102/52   Pulse 68   Temp 97.6 F (36.4 C) (Oral)   Resp 16   Ht '5\' 5"'$  (1.651 m)   Wt 184 lb 6 oz (83.6 kg)   SpO2 97%   BMI 30.68 kg/m  Physical Exam Vitals and nursing note reviewed.  Constitutional:      General: He is not in acute distress.    Appearance: Normal appearance. He is not ill-appearing.  HENT:     Head: Normocephalic.  Eyes:     Extraocular Movements: Extraocular movements intact.     Pupils: Pupils are equal, round, and reactive to light.  Neck:     Vascular: No carotid bruit.  Cardiovascular:     Rate and Rhythm: Normal rate and regular rhythm.     Pulses: Normal pulses.     Heart sounds: Normal heart sounds.  Pulmonary:     Effort: Pulmonary effort is normal.     Breath sounds: Wheezing present.  Abdominal:     General: Bowel sounds are normal. There is no distension.     Palpations: Abdomen is soft. There is no mass.     Tenderness: There is no abdominal tenderness. There is no right CVA tenderness, left CVA tenderness, guarding or rebound.  Musculoskeletal:  General: No swelling or tenderness. Normal range of motion.     Cervical back: Normal range of motion. No tenderness.     Left lower leg: No edema.  Lymphadenopathy:     Cervical: No cervical adenopathy.  Skin:    General: Skin is warm and dry.     Capillary Refill: Capillary refill takes less than 2 seconds.     Findings: Bruising present.  Neurological:     General: No focal deficit present.     Mental Status: He is alert and oriented to person, place, and time.     Cranial Nerves: No cranial nerve deficit.     Sensory: No sensory deficit.     Motor: Weakness present.     Coordination: Coordination normal.     Gait: Gait abnormal.     Deep Tendon Reflexes: Reflexes normal.  Psychiatric:        Mood and Affect: Mood normal.        Behavior: Behavior normal.        Thought Content: Thought  content normal.        Judgment: Judgment normal.           Assessment & Plan:   Problem List Items Addressed This Visit     Hyperlipidemia with target LDL less than 70 (Chronic)   Relevant Orders   CBC with Differential/Platelet (Completed)   Comprehensive metabolic panel (Completed)   Hemoglobin A1c (Completed)   TSH (Completed)   Iron, TIBC and Ferritin Panel (Completed)   B12 and Folate Panel (Completed)   T4, free (Completed)   Magnesium (Completed)   Chronic obstructive pulmonary disease, unspecified (HCC)    Chronic.  Experiencing shortness of breath/dyspnea with rest and exertion.  Unclear if this is related to COPD versus deconditioning versus low hemoglobin.  We will obtain labs today for evaluation and monitoring.  Patient is followed by pulmonology.  Strongly encourage patient to continue with current medication management.      Relevant Orders   CBC with Differential/Platelet (Completed)   Comprehensive metabolic panel (Completed)   Hemoglobin A1c (Completed)   TSH (Completed)   Iron, TIBC and Ferritin Panel (Completed)   B12 and Folate Panel (Completed)   T4, free (Completed)   Magnesium (Completed)   Poorly controlled type 2 diabetes mellitus with peripheral neuropathy ([place])    Diabetes not well controlled at this time.  Patient is taking insulin however it appears he has stopped his other diabetes medications.  At this time it is unclear if he is following a diabetic diet.  Unfortunately with the significant comorbidities present and uncontrolled blood sugars I am concerned that we will continue to see decline if we cannot get changes made quickly.  At this time I do feel that additional services are necessary to help the patient manage his chronic medical conditions more closely to help avoid increased morbidity and mortality.  We will plan to work close with chronic care management.      CAD (coronary artery disease), native coronary artery    Chronic.  No  alarm symptoms present at this time.  Continue current medications.      Relevant Orders   CBC with Differential/Platelet (Completed)   Comprehensive metabolic panel (Completed)   Hemoglobin A1c (Completed)   TSH (Completed)   Iron, TIBC and Ferritin Panel (Completed)   B12 and Folate Panel (Completed)   T4, free (Completed)   Magnesium (Completed)   Hypertension associated with diabetes (HCC)    Chronic history  of hypertension with recent blood pressures on the lower end of normal.  Given low blood pressure today I am concerned that an episode of hypotension may have prompted the recent fall.  Patient is currently on amlodipine 5 mg.  Patient has been instructed to stop amlodipine today and not to take this anymore.  We will continue to monitor blood pressures closely and make changes as necessary.      Weakness - Primary    Continued weakness with recent episode of loss of consciousness and fall.  Etiology at this time is unclear.  The patient does have multiple comorbidities that could exacerbate this issue.  He symptoms could be multifactorial in nature.  At this time recommend monitoring of labs to ensure that there are no concerning findings present today and will plan to follow-up closely.  Recommend follow-up with cardiology and pulmonology as well.      Relevant Orders   CBC with Differential/Platelet (Completed)   Comprehensive metabolic panel (Completed)   Hemoglobin A1c (Completed)   TSH (Completed)   Iron, TIBC and Ferritin Panel (Completed)   B12 and Folate Panel (Completed)   T4, free (Completed)   Magnesium (Completed)   Diabetes mellitus due to underlying condition with hyperglycemia ([place])   Relevant Orders   Hemoglobin A1c (Completed)   Chronic heart failure ([place])     Orma Render, DNP, AGNP-c 12/08/2021  8:29 PM

## 2021-10-29 LAB — CBC WITH DIFFERENTIAL/PLATELET
Basophils Absolute: 0.1 10*3/uL (ref 0.0–0.2)
Basos: 1 %
EOS (ABSOLUTE): 0.2 10*3/uL (ref 0.0–0.4)
Eos: 3 %
Hematocrit: 38.4 % (ref 37.5–51.0)
Hemoglobin: 11.3 g/dL — ABNORMAL LOW (ref 13.0–17.7)
Immature Grans (Abs): 0 10*3/uL (ref 0.0–0.1)
Immature Granulocytes: 0 %
Lymphocytes Absolute: 1.7 10*3/uL (ref 0.7–3.1)
Lymphs: 23 %
MCH: 24 pg — ABNORMAL LOW (ref 26.6–33.0)
MCHC: 29.4 g/dL — ABNORMAL LOW (ref 31.5–35.7)
MCV: 82 fL (ref 79–97)
Monocytes Absolute: 0.5 10*3/uL (ref 0.1–0.9)
Monocytes: 7 %
Neutrophils Absolute: 4.9 10*3/uL (ref 1.4–7.0)
Neutrophils: 66 %
Platelets: 178 10*3/uL (ref 150–450)
RBC: 4.7 x10E6/uL (ref 4.14–5.80)
RDW: 14.5 % (ref 11.6–15.4)
WBC: 7.5 10*3/uL (ref 3.4–10.8)

## 2021-10-29 LAB — COMPREHENSIVE METABOLIC PANEL
ALT: 23 IU/L (ref 0–44)
AST: 32 IU/L (ref 0–40)
Albumin/Globulin Ratio: 1.5 (ref 1.2–2.2)
Albumin: 4.3 g/dL (ref 3.8–4.8)
Alkaline Phosphatase: 130 IU/L — ABNORMAL HIGH (ref 44–121)
BUN/Creatinine Ratio: 17 (ref 10–24)
BUN: 17 mg/dL (ref 8–27)
Bilirubin Total: 0.6 mg/dL (ref 0.0–1.2)
CO2: 20 mmol/L (ref 20–29)
Calcium: 9.6 mg/dL (ref 8.6–10.2)
Chloride: 99 mmol/L (ref 96–106)
Creatinine, Ser: 1.02 mg/dL (ref 0.76–1.27)
Globulin, Total: 2.9 g/dL (ref 1.5–4.5)
Glucose: 287 mg/dL — ABNORMAL HIGH (ref 70–99)
Potassium: 5.1 mmol/L (ref 3.5–5.2)
Sodium: 137 mmol/L (ref 134–144)
Total Protein: 7.2 g/dL (ref 6.0–8.5)
eGFR: 78 mL/min/{1.73_m2} (ref >59)

## 2021-10-29 LAB — IRON,TIBC AND FERRITIN PANEL
Ferritin: 18 ng/mL — ABNORMAL LOW (ref 30–400)
Iron Saturation: 11 % — ABNORMAL LOW (ref 15–55)
Iron: 51 ug/dL (ref 38–169)
Total Iron Binding Capacity: 462 ug/dL — ABNORMAL HIGH (ref 250–450)
UIBC: 411 ug/dL — ABNORMAL HIGH (ref 111–343)

## 2021-10-29 LAB — TSH: TSH: 2.33 u[IU]/mL (ref 0.450–4.500)

## 2021-10-29 LAB — MAGNESIUM: Magnesium: 1.9 mg/dL (ref 1.6–2.3)

## 2021-10-29 LAB — HEMOGLOBIN A1C
Est. average glucose Bld gHb Est-mCnc: 298 mg/dL
Hgb A1c MFr Bld: 12 % — ABNORMAL HIGH (ref 4.8–5.6)

## 2021-10-29 LAB — B12 AND FOLATE PANEL
Folate: 8.6 ng/mL (ref >3.0)
Vitamin B-12: 506 pg/mL (ref 232–1245)

## 2021-10-29 LAB — T4, FREE: Free T4: 0.94 ng/dL (ref 0.82–1.77)

## 2021-11-02 ENCOUNTER — Telehealth: Payer: Self-pay

## 2021-11-02 NOTE — Telephone Encounter (Signed)
   Telephone encounter was:  Successful.  11/02/2021 Name: Paul [name] MRN: [id number] DOB: 1950-11-18  Paul [name] is a 71 y.o. year old male who is a primary care patient of Early, [name], NP . The community resource team was consulted for assistance with [place] guide performed the following interventions: Follow up call placed to the patient to discuss status of referral. Patient received the resources by mail.  Follow Up Plan:  No further follow up planned at this time. The patient has been provided with needed resources.  Long management  [place], [place]  Main Phone: [phone]  E-mail: [email or web address]  Website: [email or web address]

## 2021-11-03 ENCOUNTER — Telehealth: Payer: Self-pay

## 2021-11-04 ENCOUNTER — Encounter (HOSPITAL_BASED_OUTPATIENT_CLINIC_OR_DEPARTMENT_OTHER): Payer: Self-pay | Admitting: Nurse Practitioner

## 2021-11-04 ENCOUNTER — Ambulatory Visit (INDEPENDENT_AMBULATORY_CARE_PROVIDER_SITE_OTHER): Payer: Medicare Other

## 2021-11-04 ENCOUNTER — Encounter (HOSPITAL_BASED_OUTPATIENT_CLINIC_OR_DEPARTMENT_OTHER): Payer: Self-pay

## 2021-11-04 DIAGNOSIS — Z0000 Encounter for general adult medical examination without abnormal findings: Secondary | ICD-10-CM

## 2021-11-04 NOTE — Patient Instructions (Signed)
Health Maintenance, Male Adopting a healthy lifestyle and getting preventive care are important in promoting health and wellness. Ask your health care provider about: The right schedule for you to have regular tests and exams. Things you can do on your own to prevent diseases and keep yourself healthy. What should I know about diet, weight, and exercise? Eat a healthy diet  Eat a diet that includes plenty of vegetables, fruits, low-fat dairy products, and lean protein. Do not eat a lot of foods that are high in solid fats, added sugars, or sodium. Maintain a healthy weight Body mass index (BMI) is a measurement that can be used to identify possible weight problems. It estimates body fat based on height and weight. Your health care provider can help determine your BMI and help you achieve or maintain a healthy weight. Get regular exercise Get regular exercise. This is one of the most important things you can do for your health. Most adults should: Exercise for at least 150 minutes each week. The exercise should increase your heart rate and make you sweat (moderate-intensity exercise). Do strengthening exercises at least twice a week. This is in addition to the moderate-intensity exercise. Spend less time sitting. Even light physical activity can be beneficial. Watch cholesterol and blood lipids Have your blood tested for lipids and cholesterol at 73 years of age, then have this test every 5 years. You may need to have your cholesterol levels checked more often if: Your lipid or cholesterol levels are high. You are older than 73 years of age. You are at high risk for heart disease. What should I know about cancer screening? Many types of cancers can be detected early and may often be prevented. Depending on your health history and family history, you may need to have cancer screening at various ages. This may include screening for: Colorectal cancer. Prostate cancer. Skin cancer. Lung  cancer. What should I know about heart disease, diabetes, and high blood pressure? Blood pressure and heart disease High blood pressure causes heart disease and increases the risk of stroke. This is more likely to develop in people who have high blood pressure readings or are overweight. Talk with your health care provider about your target blood pressure readings. Have your blood pressure checked: Every 3-5 years if you are 18-39 years of age. Every year if you are 40 years old or older. If you are between the ages of 65 and 75 and are a current or former smoker, ask your health care provider if you should have a one-time screening for abdominal aortic aneurysm (AAA). Diabetes Have regular diabetes screenings. This checks your fasting blood sugar level. Have the screening done: Once every three years after age 45 if you are at a normal weight and have a low risk for diabetes. More often and at a younger age if you are overweight or have a high risk for diabetes. What should I know about preventing infection? Hepatitis B If you have a higher risk for hepatitis B, you should be screened for this virus. Talk with your health care provider to find out if you are at risk for hepatitis B infection. Hepatitis C Blood testing is recommended for: Everyone born from 1945 through 1965. Anyone with known risk factors for hepatitis C. Sexually transmitted infections (STIs) You should be screened each year for STIs, including gonorrhea and chlamydia, if: You are sexually active and are younger than 73 years of age. You are older than 73 years of age and your   health care provider tells you that you are at risk for this type of infection. Your sexual activity has changed since you were last screened, and you are at increased risk for chlamydia or gonorrhea. Ask your health care provider if you are at risk. Ask your health care provider about whether you are at high risk for HIV. Your health care provider  may recommend a prescription medicine to help prevent HIV infection. If you choose to take medicine to prevent HIV, you should first get tested for HIV. You should then be tested every 3 months for as long as you are taking the medicine. Follow these instructions at home: Alcohol use Do not drink alcohol if your health care provider tells you not to drink. If you drink alcohol: Limit how much you have to 0-2 drinks a day. Know how much alcohol is in your drink. In the [place] one drink equals one 12 oz bottle of beer (355 mL), one 5 oz glass of wine (148 mL), or one 1 oz glass of hard liquor (44 mL). Lifestyle Do not use any products that contain nicotine or tobacco. These products include cigarettes, chewing tobacco, and vaping devices, such as e-cigarettes. If you need help quitting, ask your health care provider. Do not use street drugs. Do not share needles. Ask your health care provider for help if you need support or information about quitting drugs. General instructions Schedule regular health, dental, and eye exams. Stay current with your vaccines. Tell your health care provider if: You often feel depressed. You have ever been abused or do not feel safe at home. Summary Adopting a healthy lifestyle and getting preventive care are important in promoting health and wellness. Follow your health care provider's instructions about healthy diet, exercising, and getting tested or screened for diseases. Follow your health care provider's instructions on monitoring your cholesterol and blood pressure. This information is not intended to replace advice given to you by your health care provider. Make sure you discuss any questions you have with your health care provider. Document Revised: 08/23/2020 Document Reviewed: 08/23/2020 Elsevier Patient Education  2023 [employer]  

## 2021-11-04 NOTE — Progress Notes (Signed)
Subjective:   Paul [name] is a 71 y.o. male who presents for an Initial Medicare Annual Wellness Visit.  I connected with  Paul [name] on 11/04/21 by a audio enabled telemedicine application and verified that I am speaking with the correct person using two identifiers.  Patient Location: Home  Provider Location: Office/Clinic  I discussed the limitations of evaluation and management by telemedicine. The patient expressed understanding and agreed to proceed.       Objective:    Today's Vitals   11/04/21 1132  PainSc: 4    There is no height or weight on file to calculate BMI.     10/26/2021   11:09 AM 03/07/2021   11:08 AM 01/25/2021    6:11 AM 01/21/2021    9:46 AM 01/09/2021    6:27 PM 12/28/2020    6:09 AM 07/14/2020   10:55 AM  Advanced Directives  Does Patient Have a Medical Advance Directive? No Yes No No No No No  Type of Advance Directive  Living will;Healthcare Power of Attorney       Would patient like information on creating a medical advance directive? No - Patient declined No - Patient declined No - Patient declined No - Patient declined No - Patient declined No - Patient declined No - Patient declined    Current Medications (verified) Outpatient Encounter Medications as of 11/04/2021  Medication Sig   acetaminophen (TYLENOL) 500 MG tablet Take 1,500-2,000 mg by mouth daily.   albuterol (PROVENTIL) (2.5 MG/3ML) 0.083% nebulizer solution Take 3 mLs by nebulization every 4 (four) hours as needed.   albuterol (PROVENTIL) (2.5 MG/3ML) 0.083% nebulizer solution Take 3 mLs (2.5 mg total) by nebulization every 4 (four) hours as needed for wheezing or shortness of breath.   albuterol (VENTOLIN HFA) 108 (90 Base) MCG/ACT inhaler Inhale 2 puffs into the lungs every 4 (four) hours as needed. For breathing.   allopurinol (ZYLOPRIM) 100 MG tablet Take 1 tablet (100 mg total) by mouth daily.   aspirin 81 MG EC tablet Take 1 tablet (81 mg total) by mouth daily.   benzonatate  (TESSALON PERLES) 100 MG capsule Take 1 capsule (100 mg total) by mouth 3 (three) times daily as needed for cough. (Patient not taking: Reported on 10/28/2021)   clopidogrel (PLAVIX) 75 MG tablet Take 1 tablet (75 mg total) by mouth daily.   fluticasone (FLONASE) 50 MCG/ACT nasal spray Place 2 sprays into both nostrils daily.   gabapentin (NEURONTIN) 300 MG capsule Take 2 capsules (600 mg total) by mouth 2 (two) times daily. For back and foot pain.   glucose blood (ONETOUCH VERIO) test strip Use to check blood glucose 3 times daily   HYDROcodone-acetaminophen (NORCO/VICODIN) 5-325 MG tablet Take 1 tablet by mouth every 8 (eight) hours as needed for moderate pain (4-6 on pain scale)   HYDROcodone-acetaminophen (NORCO/VICODIN) 5-325 MG tablet Take 1 tablet by mouth every 8 (eight) hours as needed for pain.   HYDROcodone-acetaminophen (NORCO/VICODIN) 5-325 MG tablet Take 1 tablet by mouth every 8 (eight) hours as needed for pain.   insulin detemir (LEVEMIR FLEXTOUCH) 100 UNIT/ML FlexPen Inject 40 Units into the skin daily.   insulin lispro (HUMALOG KWIKPEN) 200 UNIT/ML KwikPen Inject 14 Units into the skin every morning AND 14 Units daily with lunch AND 14Units daily with supper.   Insulin Pen Needle 32G X 4 MM MISC Use daily with insulin as directed   menthol-cetylpyridinium (CEPACOL) 3 MG lozenge Take 1 lozenge (3 mg total) by mouth as  needed for sore throat.   metFORMIN (GLUCOPHAGE-XR) 500 MG 24 hr tablet Take 2 tablets (1,000 mg total) by mouth 2 (two) times daily. For Diabetes   metoprolol tartrate (LOPRESSOR) 25 MG tablet Take 1 tablet (25 mg total) by mouth 2 (two) times daily.   nitroGLYCERIN (NITROSTAT) 0.4 MG SL tablet one tablet under tongue, q5 minutes for chest pain   pantoprazole (PROTONIX) 40 MG tablet Take 1 tablet (40 mg total) by mouth daily.   polyethylene glycol (MIRALAX / GLYCOLAX) 17 g packet Take 17 g by mouth daily as needed for mild constipation. (Patient not taking: Reported on  10/28/2021)   rosuvastatin (CRESTOR) 20 MG tablet Take 0.5 tablets (10 mg total) by mouth daily. Or you may take 1 tablet ('20mg'$ ) every other day if the pill is too small to split.   tamsulosin (FLOMAX) 0.4 MG CAPS capsule Take 1 capsule (0.4 mg total) by mouth daily.   Tetrahydrozoline HCl (VISINE EXTRA OP) Place 1 drop into both eyes as needed (itching).   Tiotropium Bromide-Olodaterol (STIOLTO RESPIMAT) 2.5-2.5 MCG/ACT AERS Inhale 2 puffs into the lungs daily.   Tiotropium Bromide-Olodaterol (STIOLTO RESPIMAT) 2.5-2.5 MCG/ACT AERS Inhale 2 puffs into the lungs daily.   Vitamin D, Ergocalciferol, (DRISDOL) 1.25 MG (50000 UNIT) CAPS capsule Take 1 capsule (50,000 Units total) by mouth once a week.   zolpidem (AMBIEN) 10 MG tablet Take 1 tablet (10 mg total) by mouth at bedtime as needed for sleep   No facility-administered encounter medications on file as of 11/04/2021.    Allergies (verified) Morphine and related, Cortisone, and Other   History: Past Medical History:  Diagnosis Date   CAD 11/24/2011   a) Mild-to-moderate 30-40% lesions in the RCA, LAD and Circumflex. b) CULPRIT LESION: long tubular 70-80% lesion in D1 with FFR of 0.7 --> PCI w/ Xience Xpedition DES 2.5 mm x 30 mm (2.65 MM); c) Lexiscan Myoview 11/2013: No Ischemia or Infarct (Inferior Gut Attenuation) EF 63%.   Constipation by delayed colonic transit 06/13/2021   COPD (chronic obstructive pulmonary disease) ([place])    "don't have full case of it; I'm right there at it"   Diabetes mellitus without complication ([place])    Dysphagia    Essential hypertension 11/24/2011   Exertional dyspnea, chronic    Gout    Hyperlipidemia with target LDL less than 70 11/24/2011   Obesity (BMI 30.0-34.9)    OSA (obstructive sleep apnea), uses oxygen at home did not tolerate cpap 11/24/2011   S/P CABG (coronary artery bypass graft)    Past Surgical History:  Procedure Laterality Date   CERVICAL SPINE SURGERY  2012   CORONARY ANGIOPLASTY  WITH STENT PLACEMENT  11/23/2011   "1; first one"   CORONARY ARTERY BYPASS GRAFT N/A 01/25/2021   Procedure: CORONARY ARTERY BYPASS GRAFTING (CABG) X2, USING LEFT INTERNAL MAMMARY ARTERY AND LEFT LEG GREATER SAPHENOUS VEIN HARVESTED ENDOSCOPICALLY;  Surgeon: Lajuana Matte, MD;  Location: [facility];  Service: Open Heart Surgery;  Laterality: N/A;   ENDOVEIN HARVEST OF GREATER SAPHENOUS VEIN Bilateral 01/25/2021   Procedure: ENDOVEIN HARVEST OF GREATER SAPHENOUS VEIN;  Surgeon: Lajuana Matte, MD;  Location: [facility];  Service: Open Heart Surgery;  Laterality: Bilateral;   LEFT HEART CATH AND CORONARY ANGIOGRAPHY N/A 12/28/2020   Procedure: LEFT HEART CATH AND CORONARY ANGIOGRAPHY;  Surgeon: Leonie Man, MD;  Location: [facility];  Service: Cardiovascular;  Laterality: N/A;   LEFT HEART CATHETERIZATION WITH CORONARY ANGIOGRAM N/A 11/23/2011   Procedure:  LEFT HEART CATHETERIZATION WITH CORONARY ANGIOGRAM;  Surgeon: Leonie Man, MD;  [facility] CATH LAB;  Service: Cardiovascular;  Laterality: N/A;   TEE WITHOUT CARDIOVERSION N/A 01/25/2021   Procedure: TRANSESOPHAGEAL ECHOCARDIOGRAM (TEE);  Surgeon: Lajuana Matte, MD;  Location: [facility];  Service: Open Heart Surgery;  Laterality: N/A;   Family History  Problem Relation Age of Onset   Heart disease Sister    Heart attack Brother    Emphysema Father        smoked   Aneurysm Father    Social History   Socioeconomic History   Marital status: Divorced    Spouse name: Not on file   Number of children: Not on file   Years of education: Not on file   Highest education level: Not on file  Occupational History   Not on file  Tobacco Use   Smoking status: Former    Packs/day: 1.00    Years: 40.00    Total pack years: 40.00    Types: Cigarettes    Quit date: 06/10/2016    Years since quitting: 5.4   Smokeless tobacco: Never   Tobacco comments:    02/18/14- smokes occ cig maybe 3 x per wk  Vaping Use   Vaping Use:  Never used  Substance and Sexual Activity   Alcohol use: Yes    Alcohol/week: 0.0 standard drinks of alcohol    Comment: social 2-3 drink a week   Drug use: No   Sexual activity: Not Currently  Other Topics Concern   Not on file  Social History Narrative   He is a 71 y.o. divorced father of 76, grandfather 1.   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   He quit smoking in August 2013, after smoking a pack a day for roughly 40 years.   He drinks socially 2-3 drinks a week only.   He does not get routine exercise, mostly due to 2 fatigue and dyspnea. Otherwise been relatively sedentary.   Social Determinants of Health   Financial Resource Strain: High Risk (11/04/2021)   Overall Financial Resource Strain (CARDIA)    Difficulty of Paying Living Expenses: Hard  Food Insecurity: Food Insecurity Present (09/28/2021)   Hunger Vital Sign    Worried About Running Out of Food in the Last Year: Often true    Ran Out of Food in the Last Year: Often true  Transportation Needs: No Transportation Needs (11/04/2021)   PRAPARE - Hydrologist (Medical): No    Lack of Transportation (Non-Medical): No  Physical Activity: Inactive (11/04/2021)   Exercise Vital Sign    Days of Exercise per Week: 0 days    Minutes of Exercise per Session: 0 min  Stress: Not on file  Social Connections: Socially Isolated (11/04/2021)   Social Connection and Isolation Panel [NHANES]    Frequency of Communication with Friends and Family: More than three times a week    Frequency of Social Gatherings with Friends and Family: Twice a week    Attends Religious Services: Never    Marine scientist or Organizations: No    Attends Archivist Meetings: Never    Marital Status: Divorced    Tobacco Counseling Counseling given: Not Answered Tobacco  comments: 02/18/14- smokes occ cig maybe 3 x per wk   Clinical Intake:  Pre-visit preparation completed: Yes  Pain : 0-10 Pain Score: 4  Pain Type: Chronic pain Pain Location: Shoulder     Diabetes: Yes CBG done?: Yes CBG resulted in Enter/ Edit results?: No  How often do you need to have someone help you when you read instructions, pamphlets, or other written materials from your doctor or pharmacy?: 3 - Sometimes What is the last grade level you completed in school?: 11th  Diabetic?yes Nutrition Risk Assessment:  Has the patient had any N/V/D within the last 2 months?  No  Does the patient have any non-healing wounds?  No  Has the patient had any unintentional weight loss or weight gain?  No   Diabetes:  Is the patient diabetic?  Yes  If diabetic, was a CBG obtained today?  Yes  Did the patient bring in their glucometer from home?   Pt home How often do you monitor your CBG's? Once daily .   Financial Strains and Diabetes Management:  Are you having any financial strains with the device, your supplies or your medication? Yes .  Does the patient want to be seen by Chronic Care Management for management of their diabetes?  No  Would the patient like to be referred to a Nutritionist or for Diabetic Management?  No   Diabetic Exams:  Diabetic Eye Exam: Completed 08/31/2021 Diabetic Foot Exam: Completed 10/28/2021   Interpreter Needed?: No      Activities of Daily Living    11/04/2021   11:16 AM 03/07/2021   11:17 AM  In your present state of health, do you have any difficulty performing the following activities:  Hearing? 0 0  Vision? 0 0  Difficulty concentrating or making decisions? 0 0  Walking or climbing stairs? 1 0  Dressing or bathing? 0 0  Doing errands, shopping? 1 1  Comment  Has assistance with this task  Preparing Food and eating ? N N  Using the Toilet? N N  In the past six months, have you accidently leaked urine? N Y  Comment  Decline  incontinence pads  Do you have problems with loss of bowel control? N N  Managing your Medications? N N  Managing your Finances? N N  Comment ex son in laws gf helps him   Housekeeping or managing your Housekeeping? N N    Patient Care Team: Early, [name], NP as PCP - General (Nurse Practitioner) Leonie Man, MD as PCP - Cardiology (Cardiology) Saporito, Maree Erie, LCSW as Social Worker (Licensed Clinical Social Worker)  Indicate any recent [employer] you may have received from other than [facility] providers in the past year (date may be approximate).     Assessment:   This is a routine wellness examination for Paul [name].  Hearing/Vision screen No results found.  Dietary issues and exercise activities discussed:     Goals Addressed   None   Depression Screen    11/04/2021   11:10 AM 10/28/2021   10:40 AM 03/07/2021   10:37 AM 07/29/2018   10:12 AM  PHQ 2/9 Scores  PHQ - 2 Score 0 0 0 0  PHQ- 9 Score 0 0      Fall Risk    10/28/2021   10:40 AM 03/07/2021   10:38 AM 07/29/2018   10:11 AM 06/04/2018    9:47 AM 10/31/2017   10:42 AM  Fall Risk   Falls in the past year? '1 1 1 '$ 0 No  Number falls in past yr: 0 1 1    Injury with  Fall? 1 0 1    Comment   hit shoulder, saw chiropractor w x ray (no injury)    Risk for fall due to : History of fall(s) Impaired balance/gait History of fall(s)  History of fall(s);Impaired balance/gait  Follow up Falls evaluation completed Education provided;Falls prevention discussed       FALL RISK PREVENTION PERTAINING TO THE HOME:  Any stairs in or around the home? Yes  If so, are there any without handrails? Yes  Home free of loose throw rugs in walkways, pet beds, electrical cords, etc? Yes  Adequate lighting in your home to reduce risk of falls? Yes   ASSISTIVE DEVICES UTILIZED TO PREVENT FALLS:  Life alert? No  Use of a cane, walker or w/c? Yes  Grab bars in the bathroom? Yes  Shower chair or bench in shower? Yes  Elevated  toilet seat or a handicapped toilet? Yes        11/04/2021   11:19 AM  6CIT Screen  What Year? 4 points  What month? 0 points  What time? 0 points  Count back from 20 0 points  Months in reverse 4 points  Repeat phrase 4 points  Total Score 12 points    Immunizations Immunization History  Administered Date(s) Administered   Fluad Quad(high Dose 65+) 02/23/2020   Influenza Split 01/05/2011, 01/08/2013, 01/15/2014, 02/09/2014   Influenza,inj,Quad PF,6+ Mos 02/17/2015   Influenza-Unspecified 02/08/2016, 01/15/2017, 03/10/2018   PFIZER(Purple Top)SARS-COV-2 Vaccination 06/19/2019, 07/16/2019   Pneumococcal Polysaccharide-23 05/13/2010    TDAP status: Up to date  Flu Vaccine status: Up to date  Pneumococcal vaccine status: Up to date  Covid-19 vaccine status: Information provided on how to obtain vaccines.   Qualifies for Shingles Vaccine? Yes   Zostavax completed No   Shingrix Completed?: No.    Education has been provided regarding the importance of this vaccine. Patient has been advised to call insurance company to determine out of pocket expense if they have not yet received this vaccine. Advised may also receive vaccine at local pharmacy or Health Dept. Verbalized acceptance and understanding.  Screening Tests Health Maintenance  Topic Date Due   URINE MICROALBUMIN  Never done   COLONOSCOPY (Pts 45-83yr Insurance coverage will need to be confirmed)  Never done   Pneumonia Vaccine 73 Years old (2 - PCV) 10/29/2022 (Originally 05/14/2011)   TETANUS/TDAP  10/29/2022 (Originally 12/15/1969)   INFLUENZA VACCINE  11/15/2021   HEMOGLOBIN A1C  04/30/2022   OPHTHALMOLOGY EXAM  09/01/2022   FOOT EXAM  10/29/2022   HPV VACCINES  Aged Out   COVID-19 Vaccine  Discontinued   Hepatitis C Screening  Discontinued   Zoster Vaccines- Shingrix  Discontinued    Health Maintenance  Health Maintenance Due  Topic Date Due   URINE MICROALBUMIN  Never done   COLONOSCOPY (Pts  45-411yrInsurance coverage will need to be confirmed)  Never done    Colorectal cancer screening: Type of screening: Colonoscopy. Completed yes. Repeat every 10 years  Lung Cancer Screening: (Low Dose CT Chest recommended if Age 73-80ears, 30 pack-year currently smoking OR have quit w/in 15years.) does qualify.   Lung Cancer Screening Referral: declines at this time  Additional Screening:  Hepatitis C Screening: does not qualify; Completed   Vision Screening: Recommended annual ophthalmology exams for early detection of glaucoma and other disorders of the eye. Is the patient up to date with their annual eye exam?  Yes  Who is the provider or what is the name of the  office in which the patient attends annual eye exams? [facility] If pt is not established with a provider, would they like to be referred to a provider to establish care?  Establish .   Dental Screening: Recommended annual dental exams for proper oral hygiene  Community Resource Referral / Chronic Care Management: CRR required this visit?  No   CCM required this visit?  No      Plan:     I have personally reviewed and noted the following in the patient's chart:   Medical and social history Use of alcohol, tobacco or illicit drugs  Current medications and supplements including opioid prescriptions. yes Functional ability and status Nutritional status Physical activity Advanced directives List of other physicians Hospitalizations, surgeries, and ER visits in previous 12 months Vitals Screenings to include cognitive, depression, and falls Referrals and appointments  In addition, I have reviewed and discussed with patient certain preventive protocols, quality metrics, and best practice recommendations. A written personalized care plan for preventive services as well as general preventive health recommendations were provided to patient.     Paul [name]   11/04/2021   Nurse Notes:  Pt would like to  discuss options to increase energy. He states that he has heard of an injection for energy.   Mr. Vantol , Thank you for taking time to come for your Medicare Wellness Visit. I appreciate your ongoing commitment to your health goals. Please review the following plan we discussed and let me know if I can assist you in the future.   These are the goals we discussed:  Goals      Development plan of care for management of Diabetes        This is a list of the screening recommended for you and due dates:  Health Maintenance  Topic Date Due   Urine Protein Check  Never done   Colon Cancer Screening  Never done   Pneumonia Vaccine (2 - PCV) 10/29/2022*   Tetanus Vaccine  10/29/2022*   Flu Shot  11/15/2021   Hemoglobin A1C  04/30/2022   Eye exam for diabetics  09/01/2022   Complete foot exam   10/29/2022   HPV Vaccine  Aged Out   COVID-19 Vaccine  Discontinued   Hepatitis C Screening: USPSTF Recommendation to screen - Ages 37-79 yo.  Discontinued   Zoster (Shingles) Vaccine  Discontinued  *Topic was postponed. The date shown is not the original due date.

## 2021-11-07 ENCOUNTER — Ambulatory Visit: Payer: Self-pay | Admitting: *Deleted

## 2021-11-07 ENCOUNTER — Other Ambulatory Visit (HOSPITAL_COMMUNITY): Payer: Self-pay

## 2021-11-07 ENCOUNTER — Encounter: Payer: Self-pay | Admitting: *Deleted

## 2021-11-08 NOTE — Patient Instructions (Addendum)
Visit Information  Thank you for taking time to visit with me today. Please don't hesitate to contact me if I can be of assistance to you.   Following are the goals we discussed today:   Goals Addressed               This Visit's Progress     Receive Assistance Obtaining Food and Nutritional Services and Resources. (pt-stated)   On track     Care Coordination Interventions:   Motivational Interviewing Employed. PHQ2/PHQ9 Depression Screen Completed, and Results Reviewed with Patient. Solution-Focused Strategies Implemented.  Active Listening / Reflection Utilized.  Emotional Support Provided. Problem Solving / Task Centered Strategies Reviewed. Quality of Sleep Assessed, and Sleep Hygiene Techniques Promoted.  Verbalization of Feelings Encouraged.  Crisis Catering manager / Information Shared.  Suicidal Ideation/Homicidal Ideation Assessed - None Present. Collaborated with Representative from [employer], through [employer] of [place], to Place Referral for [facility]. Made Referral to  [name] (Supplemental Nutrition Assistance Program) Application. Provided [facility] in [place] in [place].          Our next appointment is by telephone on 11/17/2021 at 11:00 am.  Please call the care guide team at [phone] if you need to cancel or reschedule your appointment.   If you are experiencing a Mental Health or [place] or need someone to talk to, please call the Suicide and Crisis Lifeline: 988 call the [place] National Suicide Prevention Lifeline: 220-315-0110 or TTY: 714-434-9309 TTY 365-384-7451) to talk to a trained counselor call 1-800-273-TALK (toll free, 24 hour hotline) go to [facility] [address], [place] [phone]) call the [place]: [phone] call 911  Patient verbalizes understanding of instructions and care plan provided today and agrees to view in [place]. Active MyChart status and patient understanding of how to access instructions and care plan via MyChart confirmed with patient.     Telephone follow up appointment with care management team member scheduled for:  11/17/2021 at 11:00 am.  [name], BSW, MSW, [place]  Licensed Clinical Social Worker  [place]  Mailing [place]. [address], [place], [place] [address] Physical Address-[address], [place], [place] [address] Toll Free Main # [phone] Fax # [phone] Cell # [phone] [email or web address]

## 2021-11-08 NOTE — Patient Outreach (Addendum)
  Care Coordination   Initial Visit Note   11/08/2021 Name: Paul [name] MRN: [id number] DOB: 1950/07/16  Paul [name] is a 71 y.o. year old male who sees Early, [name], NP for primary care. I spoke with  Paul [name] by phone today.  What matters to the patients health and wellness today?  Obtaining Food and Nutritional Services and Resources.   Goals Addressed               This Visit's Progress     Receive Assistance Obtaining Food and Nutritional Services and Resources. (pt-stated)   On track     Care Coordination Interventions:   Motivational Interviewing Employed. PHQ2/PHQ9 Depression Screen Completed, and Results Reviewed with Patient. Solution-Focused Strategies Implemented.  Active Listening / Reflection Utilized.  Emotional Support Provided. Problem Solving / Task Centered Strategies Reviewed. Quality of Sleep Assessed, and Sleep Hygiene Techniques Promoted.  Verbalization of Feelings Encouraged.  Crisis Catering manager / Information Shared.  Suicidal Ideation/Homicidal Ideation Assessed - None Present. Collaborated with Representative from [employer], through [employer] of [place], to Place Referral for [facility]. Mailed [employer] (Supplemental Nutrition Assistance Program) Application. Provided [facility] in [place] in [place].        SDOH assessments and interventions completed:   Yes SDOH Interventions Today    Flowsheet Row Most Recent Value  SDOH Interventions   Food Insecurity Interventions Assist with SNAP Application, Other (Comment)  [Referral to Meals on Wheels, Radio broadcast assistant, Marketing executive and [place] Strain Interventions Other (Comment)  Financial planner to Meals on Wheels, Radio broadcast assistant, Marketing executive, and [employer] Provided, and [employer] Completed]  Housing  Interventions Intervention Not Indicated  Physical Activity Interventions Patient Refused  Stress Interventions Intervention Not Indicated  Social Connections Interventions Patient Refused  Transportation Interventions Intervention Not Indicated       Care Coordination Interventions Activated:  Yes  Care Coordination Interventions:  Yes, provided.  Follow up plan: Follow up call scheduled for 11/17/2021 at 11:00 am.  Encounter Outcome:  Pt. Visit Completed.  Paul [name], BSW, MSW, LCSW  Licensed Education officer, [facility]  Mailing [place] [address], [place], [place] [address] Physical Address-[address], [place], [place] [address] Toll Free Main # [phone] Fax # [phone] Cell # [phone] [name]

## 2021-11-09 ENCOUNTER — Other Ambulatory Visit (HOSPITAL_COMMUNITY): Payer: Self-pay

## 2021-11-09 ENCOUNTER — Other Ambulatory Visit (HOSPITAL_BASED_OUTPATIENT_CLINIC_OR_DEPARTMENT_OTHER): Payer: Self-pay | Admitting: Nurse Practitioner

## 2021-11-09 DIAGNOSIS — J449 Chronic obstructive pulmonary disease, unspecified: Secondary | ICD-10-CM

## 2021-11-09 MED ORDER — UMECLIDINIUM-VILANTEROL 62.5-25 MCG/ACT IN AEPB: 1.0000 | INHALATION_SPRAY | Freq: Every day | RESPIRATORY_TRACT | 3 refills | Status: DC

## 2021-11-17 ENCOUNTER — Other Ambulatory Visit: Payer: Self-pay | Admitting: *Deleted

## 2021-11-17 ENCOUNTER — Encounter: Payer: Self-pay | Admitting: *Deleted

## 2021-11-17 NOTE — Patient Outreach (Signed)
  Care Coordination   11/17/2021  Name: Paul [name] MRN: [id number] DOB: 03-12-1951   Care Coordination Outreach Attempts:  An unsuccessful telephone outreach was attempted today to offer the patient information about available care coordination services as a benefit of their health plan. HIPAA compliant messages were left on voicemail, providing contact information for CSW, encouraging patient to return CSW's call at his earliest convenience.  Follow Up Plan:  Additional outreach attempts will be made to offer the patient care coordination information and services.   Encounter Outcome:  No Answer.   Care Coordination Interventions Activated:  No    Care Coordination Interventions:  No, not indicated.    [name], BSW, MSW, LCSW  Licensed Education officer, environmental Health System  Mailing [place] [address] [place], [place] [address] Physical Address-[address], [place],  [address] Toll Free Main # [phone] Fax # [phone] Cell # [phone] [email or web address]

## 2021-11-21 ENCOUNTER — Telehealth: Payer: Self-pay | Admitting: *Deleted

## 2021-11-21 ENCOUNTER — Telehealth (HOSPITAL_BASED_OUTPATIENT_CLINIC_OR_DEPARTMENT_OTHER): Payer: Self-pay | Admitting: Nurse Practitioner

## 2021-11-21 NOTE — Telephone Encounter (Signed)
Received fax for PA request for back orthosis paperwork just needing signature. Paperwork is in providers red tray to sign. Please advise.

## 2021-11-21 NOTE — Chronic Care Management (AMB) (Signed)
  Care Coordination   Note   11/21/2021 Name: Royalty Domagala MRN: [id number] DOB: Aug 17, 1950  Korby Ratay is a 71 y.o. year old male who sees Early, [name], NP for primary care. I reached out to [name] by phone today to offer care coordination services.  Outreach to reschedule missed follow up with Licensed Clinical SW   Mr. Furuya was given information about Care Coordination services today including:   The Care Coordination services include support from the care team which includes your Nurse Coordinator, Clinical Social Worker, or Pharmacist.  The Care Coordination team is here to help remove barriers to the health concerns and goals most important to you. Care Coordination services are voluntary, and the patient may decline or stop services at any time by request to their care team member.   Care Coordination Consent Status: Patient agreed to services and verbal consent obtained.   Follow up plan:  Telephone appointment with care coordination team member scheduled for:  11/23/2021  Encounter Outcome:  Pt. Scheduled  Julian Hy, [place] Direct Dial: [phone]

## 2021-11-22 ENCOUNTER — Encounter: Payer: Self-pay | Admitting: Emergency Medicine

## 2021-11-22 ENCOUNTER — Other Ambulatory Visit (HOSPITAL_BASED_OUTPATIENT_CLINIC_OR_DEPARTMENT_OTHER): Payer: Self-pay

## 2021-11-22 ENCOUNTER — Other Ambulatory Visit (HOSPITAL_BASED_OUTPATIENT_CLINIC_OR_DEPARTMENT_OTHER): Payer: Self-pay | Admitting: Nurse Practitioner

## 2021-11-22 ENCOUNTER — Other Ambulatory Visit (HOSPITAL_COMMUNITY): Payer: Self-pay

## 2021-11-22 ENCOUNTER — Ambulatory Visit (INDEPENDENT_AMBULATORY_CARE_PROVIDER_SITE_OTHER): Payer: Medicare Other | Admitting: Emergency Medicine

## 2021-11-22 VITALS — BP 138/76 | HR 71 | Temp 98.2°F | Ht 65.0 in | Wt 184.0 lb

## 2021-11-22 DIAGNOSIS — J449 Chronic obstructive pulmonary disease, unspecified: Secondary | ICD-10-CM | POA: Diagnosis not present

## 2021-11-22 DIAGNOSIS — R0609 Other forms of dyspnea: Secondary | ICD-10-CM | POA: Diagnosis not present

## 2021-11-22 DIAGNOSIS — I25118 Atherosclerotic heart disease of native coronary artery with other forms of angina pectoris: Secondary | ICD-10-CM | POA: Diagnosis not present

## 2021-11-22 LAB — PULMONARY FUNCTION TEST
DL/VA % pred: 76 %
DL/VA: 3.16 ml/min/mmHg/L
DLCO cor % pred: 72 %
DLCO cor: 15.76 ml/min/mmHg
DLCO unc % pred: 64 %
DLCO unc: 14.07 ml/min/mmHg
FEF 25-75 Post: 1.43 L/sec
FEF 25-75 Pre: 1.02 L/sec
FEF2575-%Change-Post: 39 %
FEF2575-%Pred-Post: 74 %
FEF2575-%Pred-Pre: 52 %
FEV1-%Change-Post: 8 %
FEV1-%Pred-Post: 81 %
FEV1-%Pred-Pre: 74 %
FEV1-Post: 2.08 L
FEV1-Pre: 1.92 L
FEV1FVC-%Change-Post: 1 %
FEV1FVC-%Pred-Pre: 88 %
FEV6-%Change-Post: 6 %
FEV6-%Pred-Post: 93 %
FEV6-%Pred-Pre: 87 %
FEV6-Post: 3.08 L
FEV6-Pre: 2.9 L
FEV6FVC-%Change-Post: 2 %
FEV6FVC-%Pred-Post: 107 %
FEV6FVC-%Pred-Pre: 104 %
FVC-%Change-Post: 6 %
FVC-%Pred-Post: 89 %
FVC-%Pred-Pre: 83 %
FVC-Post: 3.17 L
FVC-Pre: 2.97 L
Post FEV1/FVC ratio: 66 %
Post FEV6/FVC ratio: 100 %
Pre FEV1/FVC ratio: 65 %
Pre FEV6/FVC Ratio: 98 %
RV % pred: 113 %
RV: 2.53 L
TLC % pred: 95 %
TLC: 5.73 L

## 2021-11-22 MED ORDER — BEVESPI AEROSPHERE 9-4.8 MCG/ACT IN AERO
2.0000 | INHALATION_SPRAY | Freq: Two times a day (BID) | RESPIRATORY_TRACT | 5 refills | Status: DC
  Filled 2021-11-22 (×2): qty 10.7, 30d supply, fill #0
  Filled 2021-12-09: qty 10.7, 30d supply, fill #1

## 2021-11-22 NOTE — Patient Instructions (Addendum)
Please stop Stiolto. Try starting Bevespi 2 puffs twice a day.  Take this medication on a schedule. Keep albuterol available to use 2 puffs if needed for shortness of breath, chest tightness, wheezing. We will refer you to cardiopulmonary rehab at [facility] Follow with Dr Lamonte Sakai in 6 months or sooner if you have any problems

## 2021-11-22 NOTE — Progress Notes (Signed)
Subjective:    Patient ID: Paul [name], male    DOB: 1950/11/24, 71 y.o.   MRN: [id number]  HPI 71 year old obese former smoker (40 pack years) with a history of COPD and associated chronic hypoxemic respiratory failure, coronary disease, hypertension, obstructive sleep apnea not on CPAP.  He was admitted in mid January with a right upper lobe pneumonia, associated pneumococcal bacteremia and DKA.  He was also RSV positive.  Course complicated by an agitated delirium.  He was discharged initially to rehab and is now been discharged home.  He is currently using Symbicort, Advair, DuoNeb which he uses approximately .  He is improving, is able to walk with a walker. He does have some exertional SOB. Most recent imaging was 1/17 > shows the RUL infiltrate. Occasional cough with sputum, white mucous.  PFT from 2015 reviewed by me  > mild AFL.   ROV 11/22/2021  -- Paul [name] is 71, history of obesity, former tobacco use (40 pack years) with associated COPD, OSA (not on CPAP) with chronic hypoxemic respiratory failure.  Also with a history of CAD, hypertension.  I have not seen him since 2020.  Since that time he had a CABG in 01/2021 he was last seen in our office in June of this year.  He was started back on Stiolto at that visit. Has been experiencing progressive exertional dyspnea, question some degree of deconditioning.  He underwent repeat pulmonary function testing today He reports that he isn't sure that the stiolto has helped. He uses albuterol every other day - may get benefit from this He has daily cough, prod of clear mucous. Exertional tolerance is low, has to stop after walking 166f. No wheeze.  Not currently taking flonase. He does not believe he could walk through the hospital to go to pulm rehab.   Pulmonary function testing performed today and reviewed by me show mild to moderate obstruction without a bronchodilator response, normal lung volumes, decreased diffusion capacity.   Review of  Systems  Constitutional:  Positive for fever. Negative for unexpected weight change.  HENT:  Positive for congestion and sinus pressure. Negative for dental problem, ear pain, nosebleeds, postnasal drip, rhinorrhea, sneezing, sore throat and trouble swallowing.   Eyes:  Negative for redness and itching.  Respiratory:  Positive for shortness of breath. Negative for cough, chest tightness and wheezing.   Cardiovascular:  Negative for palpitations and leg swelling.  Gastrointestinal:  Negative for nausea and vomiting.  Genitourinary:  Negative for dysuria.  Musculoskeletal:  Negative for joint swelling.  Skin:  Negative for rash.  Allergic/Immunologic: Negative.  Negative for environmental allergies, food allergies and immunocompromised state.  Neurological:  Negative for headaches.  Hematological:  Bruises/bleeds easily.  Psychiatric/Behavioral:  Negative for dysphoric mood. The patient is not nervous/anxious.    Past Medical History:  Diagnosis Date   CAD 11/24/2011   a) Mild-to-moderate 30-40% lesions in the RCA, LAD and Circumflex. b) CULPRIT LESION: long tubular 70-80% lesion in D1 with FFR of 0.7 --> PCI w/ Xience Xpedition DES 2.5 mm x 30 mm (2.65 MM); c) Lexiscan Myoview 11/2013: No Ischemia or Infarct (Inferior Gut Attenuation) EF 63%.   Constipation by delayed colonic transit 06/13/2021   COPD (chronic obstructive pulmonary disease) ([place]    "don't have full case of it; I'm right there at it"   Diabetes mellitus without complication ([place]    Dysphagia    Essential hypertension 11/24/2011   Exertional dyspnea, chronic    Gout  Hyperlipidemia with target LDL less than 70 11/24/2011   Obesity (BMI 30.0-34.9)    OSA (obstructive sleep apnea), uses oxygen at home did not tolerate cpap 11/24/2011   S/P CABG (coronary artery bypass graft)      Family History  Problem Relation Age of Onset   Heart disease Sister    Heart attack Brother    Emphysema Father        smoked    Aneurysm Father      Social History   Socioeconomic History   Marital status: Divorced    Spouse name: Not on file   Number of children: 3   Years of education: 11th   Highest education level: 11th grade  Occupational History   Not on file  Tobacco Use   Smoking status: Former    Packs/day: 1.00    Years: 40.00    Total pack years: 40.00    Types: Cigarettes    Quit date: 06/10/2016    Years since quitting: 5.4    Passive exposure: Past   Smokeless tobacco: Never   Tobacco comments:    02/18/14- smokes occ cig maybe 3 x per wk  Vaping Use   Vaping Use: Never used  Substance and Sexual Activity   Alcohol use: Yes    Alcohol/week: 0.0 standard drinks of alcohol    Comment: social 2-3 drink a week   Drug use: No   Sexual activity: Not Currently    Partners: Female  Other Topics Concern   Not on file  Social History Narrative   He is a 71 y.o. divorced father of 64, grandfather 1.   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   He quit smoking in August 2013, after smoking a pack a day for roughly 40 years.   He drinks socially 2-3 drinks a week only.   He does not get routine exercise, mostly due to 2 fatigue and dyspnea. Otherwise been relatively sedentary.   Social Determinants of Health   Financial Resource Strain: High Risk (11/07/2021)   Overall Financial Resource Strain (CARDIA)    Difficulty of Paying Living Expenses: Hard  Food Insecurity: Food Insecurity Present (11/07/2021)   Hunger Vital Sign    Worried About Running Out of Food in the Last Year: Often true    Ran Out of Food in the Last Year: Often true  Transportation Needs: No Transportation Needs (11/07/2021)   PRAPARE - Hydrologist (Medical): No    Lack of Transportation (Non-Medical): No  Physical Activity: Inactive (11/07/2021)   Exercise  Vital Sign    Days of Exercise per Week: 0 days    Minutes of Exercise per Session: 0 min  Stress: No Stress Concern Present (11/07/2021)   [place]    Feeling of Stress : Not at all  Social Connections: Moderately Isolated (11/07/2021)   Social Connection and Isolation Panel [NHANES]    Frequency of Communication with Friends and Family: More than three times a week    Frequency of Social Gatherings with Friends and Family: Twice a week    Attends Religious Services: 1 to 4 times per year    Active Member of [employer] or Organizations: No    Attends Archivist Meetings: Never    Marital Status: Divorced  Human resources officer Violence: Not At Risk (  11/07/2021)   Humiliation, Afraid, Rape, and Kick questionnaire    Fear of Current or Ex-Partner: No    Emotionally Abused: No    Physically Abused: No    Sexually Abused: No   Asbestos exposure through work [place] [employer]  Allergies  Allergen Reactions   Morphine And Related Itching    Possible itching due to morphine 01/26/21   Cortisone    Other Other (See Comments)    Cats- eyes burn     Outpatient Medications Prior to Visit  Medication Sig Dispense Refill   acetaminophen (TYLENOL) 500 MG tablet Take 1,500-2,000 mg by mouth daily.     albuterol (PROVENTIL) (2.5 MG/3ML) 0.083% nebulizer solution Take 3 mLs by nebulization every 4 (four) hours as needed. 540 mL 0   albuterol (VENTOLIN HFA) 108 (90 Base) MCG/ACT inhaler Inhale 2 puffs into the lungs every 4 (four) hours as needed. For breathing. 18 g 3   allopurinol (ZYLOPRIM) 100 MG tablet Take 1 tablet (100 mg total) by mouth daily. 90 tablet 2   amLODipine (NORVASC) 5 MG tablet Take 5 mg by mouth daily.     aspirin 81 MG EC tablet Take 1 tablet (81 mg total) by mouth daily. 30 tablet 0   gabapentin (NEURONTIN) 300 MG capsule Take 2 capsules (600 mg total) by mouth 2 (two) times daily. For back and foot pain. 120  capsule 11   glucose blood (ONETOUCH VERIO) test strip Use to check blood glucose 3 times daily 300 each 3   HYDROcodone-acetaminophen (NORCO/VICODIN) 5-325 MG tablet Take 1 tablet by mouth every 8 (eight) hours as needed for moderate pain (4-6 on pain scale) 90 tablet 0   insulin detemir (LEVEMIR FLEXTOUCH) 100 UNIT/ML FlexPen Inject 40 Units into the skin daily. 54 mL 3   insulin lispro (HUMALOG KWIKPEN) 200 UNIT/ML KwikPen Inject 14 Units into the skin every morning AND 14 Units daily with lunch AND 14Units daily with supper. 15 mL 3   Insulin Pen Needle 32G X 4 MM MISC Use daily with insulin as directed 100 each 3   metoprolol tartrate (LOPRESSOR) 25 MG tablet Take 1 tablet (25 mg total) by mouth 2 (two) times daily. 180 tablet 2   pantoprazole (PROTONIX) 40 MG tablet Take 1 tablet (40 mg total) by mouth daily. 90 tablet 2   Tetrahydrozoline HCl (VISINE EXTRA OP) Place 1 drop into both eyes as needed (itching).     Tiotropium Bromide-Olodaterol (STIOLTO RESPIMAT) 2.5-2.5 MCG/ACT AERS Inhale 2 puffs into the lungs daily. 4 g 5   zolpidem (AMBIEN) 10 MG tablet Take 1 tablet (10 mg total) by mouth at bedtime as needed for sleep 90 tablet 1   albuterol (PROVENTIL) (2.5 MG/3ML) 0.083% nebulizer solution Take 3 mLs (2.5 mg total) by nebulization every 4 (four) hours as needed for wheezing or shortness of breath. (Patient not taking: Reported on 11/22/2021) 75 mL 6   benzonatate (TESSALON PERLES) 100 MG capsule Take 1 capsule (100 mg total) by mouth 3 (three) times daily as needed for cough. (Patient not taking: Reported on 11/22/2021) 30 capsule 0   clopidogrel (PLAVIX) 75 MG tablet Take 1 tablet (75 mg total) by mouth daily. (Patient not taking: Reported on 11/22/2021) 30 tablet 0   fluticasone (FLONASE) 50 MCG/ACT nasal spray Place 2 sprays into both nostrils daily. (Patient not taking: Reported on 11/22/2021) 18.2 mL 2   HYDROcodone-acetaminophen (NORCO/VICODIN) 5-325 MG tablet Take 1 tablet by mouth every 8  (eight) hours as needed for pain. (  Patient not taking: Reported on 11/22/2021) 90 tablet 0   HYDROcodone-acetaminophen (NORCO/VICODIN) 5-325 MG tablet Take 1 tablet by mouth every 8 (eight) hours as needed for pain. (Patient not taking: Reported on 11/22/2021) 90 tablet 0   menthol-cetylpyridinium (CEPACOL) 3 MG lozenge Take 1 lozenge (3 mg total) by mouth as needed for sore throat. (Patient not taking: Reported on 11/22/2021) 100 tablet 12   metFORMIN (GLUCOPHAGE-XR) 500 MG 24 hr tablet Take 2 tablets (1,000 mg total) by mouth 2 (two) times daily. For Diabetes (Patient not taking: Reported on 11/22/2021) 360 tablet 3   nitroGLYCERIN (NITROSTAT) 0.4 MG SL tablet one tablet under tongue, q5 minutes for chest pain (Patient not taking: Reported on 11/22/2021)     polyethylene glycol (MIRALAX / GLYCOLAX) 17 g packet Take 17 g by mouth daily as needed for mild constipation. (Patient not taking: Reported on 11/22/2021)     rosuvastatin (CRESTOR) 20 MG tablet Take 0.5 tablets (10 mg total) by mouth daily. Or you may take 1 tablet ('20mg'$ ) every other day if the pill is too small to split. (Patient not taking: Reported on 11/22/2021) 45 tablet 2   tamsulosin (FLOMAX) 0.4 MG CAPS capsule Take 1 capsule (0.4 mg total) by mouth daily. (Patient not taking: Reported on 11/22/2021) 30 capsule 0   umeclidinium-vilanterol (ANORO ELLIPTA) 62.5-25 MCG/ACT AEPB Inhale 1 puff into the lungs daily at 6 (six) AM. (Patient not taking: Reported on 11/22/2021) 90 each 3   Vitamin D, Ergocalciferol, (DRISDOL) 1.25 MG (50000 UNIT) CAPS capsule Take 1 capsule (50,000 Units total) by mouth once a week. (Patient not taking: Reported on 11/22/2021) 12 capsule 3   No facility-administered medications prior to visit.        Objective:   Physical Exam Vitals:   11/22/21 1605  BP: 138/76  Pulse: 71  Temp: 98.2 F (36.8 C)  TempSrc: Oral  SpO2: 97%  Weight: 184 lb (83.5 kg)  Height: '5\' 5"'$  (1.651 m)   Gen: Pleasant, well-nourished, in no  distress,  normal affect  ENT: No lesions,  mouth clear,  oropharynx clear, no postnasal drip, hoarse voice  Neck: No JVD, no stridor  Lungs: No use of accessory muscles, no crackles or wheezing on normal respiration, no wheeze on forced expiration  Cardiovascular: RRR, heart sounds normal, no murmur or gallops, no peripheral edema  Musculoskeletal: No deformities, no cyanosis or clubbing  Neuro: alert, awake, non focal  Skin: Warm, no lesions or rash      Assessment & Plan:  Chronic obstructive pulmonary disease, unspecified ([place]) Pulmonary function testing today confirms mild to moderate obstruction.  He has not gotten much benefit from [employer].  Question whether much of his dyspnea is deconditioning especially since his CAD has been addressed as well.  It is possible that he is not delivering the Stiolto adequately, I will change him to an HFA LABA/LAMA > Bevespi.  Keep albuterol available to use if needed.  He would likely benefit from cardiopulmonary rehab.  He is willing to try this.  I will make the referral.  Please stop Stiolto. Try starting Bevespi 2 puffs twice a day.  Take this medication on a schedule. Keep albuterol available to use 2 puffs if needed for shortness of breath, chest tightness, wheezing. We will refer you to cardiopulmonary rehab at [facility]. Follow with Dr Lamonte Sakai in 6 months or sooner if you have any problems  Baltazar Apo, MD, PhD 11/22/2021, 4:39 PM [facility] [phone] or if  no answer [phone]

## 2021-11-22 NOTE — Patient Instructions (Signed)
Full PFT performed today. °

## 2021-11-22 NOTE — Assessment & Plan Note (Signed)
>>  ASSESSMENT AND PLAN FOR CHRONIC OBSTRUCTIVE PULMONARY DISEASE, UNSPECIFIED ([place]) WRITTEN ON 11/22/2021  4:39 PM BY BYRUM, Rose Fillers, MD  Pulmonary function testing today confirms mild to moderate obstruction.  He has not gotten much benefit from [employer].  Question whether much of his dyspnea is deconditioning especially since his CAD has been addressed as well.  It is possible that he is not delivering the Stiolto adequately, I will change him to an HFA LABA/LAMA > Bevespi.  Keep albuterol available to use if needed.  He would likely benefit from cardiopulmonary rehab.  He is willing to try this.  I will make the referral.  Please stop Stiolto. Try starting Bevespi 2 puffs twice a day.  Take this medication on a schedule. Keep albuterol available to use 2 puffs if needed for shortness of breath, chest tightness, wheezing. We will refer you to cardiopulmonary rehab at [facility]. Follow with Dr Lamonte Sakai in 6 months or sooner if you have any problems

## 2021-11-22 NOTE — Progress Notes (Signed)
Full PFT performed today. °

## 2021-11-22 NOTE — Assessment & Plan Note (Signed)
Pulmonary function testing today confirms mild to moderate obstruction.  He has not gotten much benefit from [employer].  Question whether much of his dyspnea is deconditioning especially since his CAD has been addressed as well.  It is possible that he is not delivering the Stiolto adequately, I will change him to an HFA LABA/LAMA > Bevespi.  Keep albuterol available to use if needed.  He would likely benefit from cardiopulmonary rehab.  He is willing to try this.  I will make the referral.  Please stop Stiolto. Try starting Bevespi 2 puffs twice a day.  Take this medication on a schedule. Keep albuterol available to use 2 puffs if needed for shortness of breath, chest tightness, wheezing. We will refer you to cardiopulmonary rehab at [facility]. Follow with Dr Lamonte Sakai in 6 months or sooner if you have any problems

## 2021-11-23 ENCOUNTER — Encounter: Payer: Self-pay | Admitting: *Deleted

## 2021-11-23 ENCOUNTER — Other Ambulatory Visit: Payer: Self-pay | Admitting: *Deleted

## 2021-11-23 ENCOUNTER — Other Ambulatory Visit (HOSPITAL_COMMUNITY): Payer: Self-pay

## 2021-11-23 MED ORDER — HYDROCODONE-ACETAMINOPHEN 5-325 MG PO TABS
1.0000 | ORAL_TABLET | Freq: Three times a day (TID) | ORAL | 0 refills | Status: DC | PRN
  Filled 2021-11-23: qty 21, 7d supply, fill #0

## 2021-11-23 NOTE — Patient Outreach (Signed)
  Care Coordination   11/23/2021  Name: Paul [name]     MRN: [id number]       DOB: 11-29-1950    Care Coordination Outreach Attempts:  An unsuccessful telephone outreach was attempted today to offer the patient information about available care coordination services as a benefit of their health plan. HIPAA compliant messages were left on voicemail, providing contact information for CSW, encouraging patient to return CSW's call at his earliest convenience.   Follow Up Plan:  Additional outreach attempts will be made to offer the patient care coordination information and services.    Encounter Outcome:  No Answer.    Care Coordination Interventions Activated:  No     Care Coordination Interventions:  No, not indicated.     [name], BSW, MSW, LCSW  Licensed Education officer, environmental Health System  Mailing [place] [address], [place], [place] [address] Physical Address-[address], [place], [place] [address] Toll Free Main # [phone] Fax # [phone] Cell # [phone] [email or web address]

## 2021-11-24 NOTE — Telephone Encounter (Signed)
Office called again regarding paperwork spoke to [place] stated provider possible still has it. Spoke to providers she will look in her box to sign and see if it is in there. Please advise.

## 2021-11-30 NOTE — Telephone Encounter (Signed)
Paperwork was signed and sent off

## 2021-12-01 ENCOUNTER — Ambulatory Visit: Payer: Self-pay | Admitting: *Deleted

## 2021-12-01 ENCOUNTER — Other Ambulatory Visit (HOSPITAL_COMMUNITY): Payer: Self-pay

## 2021-12-01 ENCOUNTER — Encounter: Payer: Self-pay | Admitting: *Deleted

## 2021-12-01 NOTE — Patient Instructions (Signed)
Visit Information  Thank you for taking time to visit with me today. Please don't hesitate to contact me if I can be of assistance to you.   Following are the goals we discussed today:   Goals Addressed               This Visit's Progress     Receive Assistance Obtaining Food and Nutritional Services and Resources. (pt-stated)   On track     Care Coordination Interventions:   Motivational Interviewing Employed. Solution-Focused Strategies Implemented.  Active Listening/Reflection Utilized.  Emotional Support Provided. Verbalization of Feelings Encouraged.  Collaborated with Representative from [employer], through [employer] of [facility], to Check the Status of Patient's Name on the Waiting List to Receive Prepared Meal Delivery Services. Reviewed [employer] Risk manager Nutrition Assistance Program) Application. Reviewed [facility] in [place] in [place].        Our next appointment is by telephone on 12/09/2021 at 12:30 pm.  Please call the care guide team at [phone] if you need to cancel or reschedule your appointment.   If you are experiencing a Mental Health or [place] or need someone to talk to, please call the Suicide and Crisis Lifeline: 988 call the [place] National Suicide Prevention Lifeline: 508-576-8603 or TTY: (234)699-7395 TTY 9702770427) to talk to a trained counselor call 1-800-273-TALK (toll free, 24 hour hotline) go to [facility] [address], [place] [phone]) call the [place]: [phone] call 911  Patient verbalizes understanding of instructions and care plan provided today and agrees to view in [place]. Active MyChart status and patient understanding of how to access instructions and care plan via MyChart confirmed with patient.      Telephone follow up appointment with care management team member scheduled for:  12/09/2021 at 12:30 pm.  [name], BSW, MSW, [place]  Licensed Clinical Social Worker  [place]  Mailing [place]. [address], [place], [place] [address] Physical Address-[address] [place], [place] [address] Toll Free Main # [phone] Fax # [phone] Cell # [phone] [email or web address]

## 2021-12-01 NOTE — Patient Outreach (Signed)
  Care Coordination   Follow Up Visit Note   12/01/2021  Name: Paul [name] MRN: [id number] DOB: [birth year]  Paul [name] is a 71 y.o. year old male who sees Early, [name], NP for primary care. I spoke with Paul [name] by phone today.  What matters to the patients health and wellness today?  Receive Assistance Obtaining Food and Nutritional Services and Resources.   Goals Addressed               This Visit's Progress     Receive Assistance Obtaining Food and Nutritional Services and Resources. (pt-stated)   On track     Care Coordination Interventions:   Motivational Interviewing Employed. Solution-Focused Strategies Implemented.  Active Listening/Reflection Utilized.  Emotional Support Provided. Verbalization of Feelings Encouraged.  Collaborated with Representative from [employer], through [employer] of [facility], to Check the Status of Patient's Name on the Waiting List to Receive Prepared Meal Delivery Services. Reviewed [employer] Risk manager Nutrition Assistance Program) Application. Reviewed [facility] in [place] in [place].        SDOH assessments and interventions completed:  Yes.    Care Coordination Interventions Activated:  Yes.   Care Coordination Interventions:  Yes, provided.   Follow up plan: Follow up call scheduled for 12/09/2021 at 12:30 pm.  Encounter Outcome:  Pt. Visit Completed.   Paul [name], BSW, MSW, LCSW  Licensed Education officer, environmental Health System  Mailing [place] [address] [place], [place] [address] Physical Address-[address] [place], [place] [address] Toll Free Main # [phone] Fax # [phone] Cell # [phone] [name]

## 2021-12-02 ENCOUNTER — Other Ambulatory Visit (HOSPITAL_COMMUNITY): Payer: Self-pay

## 2021-12-08 DIAGNOSIS — R531 Weakness: Secondary | ICD-10-CM | POA: Insufficient documentation

## 2021-12-08 DIAGNOSIS — R5383 Other fatigue: Secondary | ICD-10-CM

## 2021-12-08 DIAGNOSIS — I5032 Chronic diastolic (congestive) heart failure: Secondary | ICD-10-CM | POA: Insufficient documentation

## 2021-12-08 DIAGNOSIS — I509 Heart failure, unspecified: Secondary | ICD-10-CM | POA: Insufficient documentation

## 2021-12-08 DIAGNOSIS — E0865 Diabetes mellitus due to underlying condition with hyperglycemia: Secondary | ICD-10-CM | POA: Insufficient documentation

## 2021-12-08 HISTORY — DX: Other fatigue: R53.83

## 2021-12-08 NOTE — Assessment & Plan Note (Addendum)
Diabetes not well controlled at this time.  Patient is taking insulin however it appears he has stopped his other diabetes medications.  At this time it is unclear if he is following a diabetic diet.  Unfortunately with the significant comorbidities present and uncontrolled blood sugars I am concerned that we will continue to see decline if we cannot get changes made quickly.  At this time I do feel that additional services are necessary to help the patient manage his chronic medical conditions more closely to help avoid increased morbidity and mortality.  We will plan to work close with chronic care management.

## 2021-12-08 NOTE — Assessment & Plan Note (Signed)
Chronic.  No alarm symptoms present at this time.  Continue current medications.

## 2021-12-08 NOTE — Assessment & Plan Note (Signed)
>>  ASSESSMENT AND PLAN FOR CHRONIC OBSTRUCTIVE PULMONARY DISEASE, UNSPECIFIED ([place]) WRITTEN ON 12/08/2021  8:23 PM BY Maurica Omura E, NP  Chronic.  Experiencing shortness of breath/dyspnea with rest and exertion.  Unclear if this is related to COPD versus deconditioning versus low hemoglobin.  We will obtain labs today for evaluation and monitoring.  Patient is followed by pulmonology.  Strongly encourage patient to continue with current medication management.

## 2021-12-08 NOTE — Assessment & Plan Note (Signed)
Continued weakness with recent episode of loss of consciousness and fall.  Etiology at this time is unclear.  The patient does have multiple comorbidities that could exacerbate this issue.  He symptoms could be multifactorial in nature.  At this time recommend monitoring of labs to ensure that there are no concerning findings present today and will plan to follow-up closely.  Recommend follow-up with cardiology and pulmonology as well.

## 2021-12-08 NOTE — Assessment & Plan Note (Signed)
Chronic.  Experiencing shortness of breath/dyspnea with rest and exertion.  Unclear if this is related to COPD versus deconditioning versus low hemoglobin.  We will obtain labs today for evaluation and monitoring.  Patient is followed by pulmonology.  Strongly encourage patient to continue with current medication management.

## 2021-12-08 NOTE — Assessment & Plan Note (Addendum)
Chronic history of hypertension with recent blood pressures on the lower end of normal.  Given low blood pressure today I am concerned that an episode of hypotension may have prompted the recent fall.  Patient is currently on amlodipine 5 mg.  Patient has been instructed to stop amlodipine today and not to take this anymore.  We will continue to monitor blood pressures closely and make changes as necessary.

## 2021-12-09 ENCOUNTER — Ambulatory Visit: Payer: Self-pay | Admitting: *Deleted

## 2021-12-09 ENCOUNTER — Other Ambulatory Visit (HOSPITAL_COMMUNITY): Payer: Self-pay

## 2021-12-13 ENCOUNTER — Encounter (HOSPITAL_COMMUNITY): Payer: Self-pay

## 2021-12-14 ENCOUNTER — Encounter: Payer: Self-pay | Admitting: *Deleted

## 2021-12-14 NOTE — Patient Instructions (Signed)
Visit Information  Thank you for taking time to visit with me today. Please don't hesitate to contact me if I can be of assistance to you.   Following are the goals we discussed today:   Goals Addressed               This Visit's Progress     Receive Assistance Obtaining Food and Nutritional Services and Resources. (pt-stated)   On track     Care Coordination Interventions:   Motivational Interviewing Employed. Solution-Focused Strategies Implemented.  Active Listening/Reflection Utilized.  Emotional Support Provided. Verbalization of Feelings Encouraged.  Continued collaboration with Representative from [employer], through [employer] [facility], to Check the Status of Patient's Name on the Waiting List to Receive Prepared Meal Delivery Services.         Our next appointment is by telephone on 12/28/2021 at 3:00 pm.  Please call the care guide team at [phone] if you need to cancel or reschedule your appointment.   If you are experiencing a Mental Health or [place] or need someone to talk to, please call the Suicide and Crisis Lifeline: 988 call the [place] National Suicide Prevention Lifeline: 364-611-1416 or TTY: 3133495290 TTY 612-081-2026) to talk to a trained counselor call 1-800-273-TALK (toll free, 24 hour hotline) go to [facility] [address] [place] [phone]) call the [place]: [phone] call 911  Patient verbalizes understanding of instructions and care plan provided today and agrees to view in [place]. Active MyChart status and patient understanding of how to access instructions and care plan via MyChart confirmed with patient.     Telephone follow up appointment with care management team member scheduled for:  12/28/2021 at 3:00 pm.  [name], BSW, MSW, [place]  Licensed Clinical Social Worker  [place]  Mailing [place]. [address], [place], [place] [address] Physical Address[address], [place], [place] [address] Toll Free Main # [phone] Fax # [phone] Cell # [phone] [email or web address]

## 2021-12-14 NOTE — Patient Outreach (Signed)
  Care Coordination   Follow Up Visit Note   12/14/2021  Name: Paul [name] MRN: [id number] DOB: 1950-07-04  [name] Klug is a 71 y.o. year old male who sees Early, [name], NP for primary care. I spoke with [name] by phone today.  What matters to the patients health and wellness today?  Receive Assistance Obtaining Food and Nutritional Services and Resources.    Goals Addressed               This Visit's Progress     Receive Assistance Obtaining Food and Nutritional Services and Resources. (pt-stated)   On track     Care Coordination Interventions:   Motivational Interviewing Employed. Solution-Focused Strategies Implemented.  Active Listening/Reflection Utilized.  Emotional Support Provided. Verbalization of Feelings Encouraged.  Continued collaboration with Representative from [employer], through [employer] [facility], to Check the Status of Patient's Name on the Waiting List to Receive Prepared Meal Delivery Services.         SDOH assessments and interventions completed:  No.   Care Coordination Interventions Activated:  Yes.   Care Coordination Interventions:  Yes, provided.   Follow up plan: Follow up call scheduled for 12/28/2021 at 3:00 pm.  Encounter Outcome:  Pt. Visit Completed.   [name], BSW, MSW, LCSW  Licensed Education officer, [facility]  Mailing [place] [address] [place], [place] [address] Physical Address[address] [place], [place] [address] Toll Free Main # [phone] Fax # [phone] Cell # [phone] [email or web address]

## 2021-12-21 ENCOUNTER — Other Ambulatory Visit (HOSPITAL_COMMUNITY): Payer: Self-pay

## 2021-12-21 DIAGNOSIS — M5416 Radiculopathy, lumbar region: Secondary | ICD-10-CM | POA: Diagnosis not present

## 2021-12-21 MED ORDER — HYDROCODONE-ACETAMINOPHEN 5-325 MG PO TABS
1.0000 | ORAL_TABLET | Freq: Three times a day (TID) | ORAL | 0 refills | Status: DC | PRN
  Filled 2022-02-17: qty 90, 30d supply, fill #0

## 2021-12-21 MED ORDER — HYDROCODONE-ACETAMINOPHEN 5-325 MG PO TABS
1.0000 | ORAL_TABLET | Freq: Three times a day (TID) | ORAL | 0 refills | Status: DC | PRN
  Filled 2022-01-19: qty 90, 30d supply, fill #0

## 2021-12-21 MED ORDER — HYDROCODONE-ACETAMINOPHEN 5-325 MG PO TABS
1.0000 | ORAL_TABLET | Freq: Three times a day (TID) | ORAL | 0 refills | Status: DC | PRN
  Filled 2021-12-21: qty 90, 30d supply, fill #0

## 2021-12-28 ENCOUNTER — Encounter: Payer: Self-pay | Admitting: *Deleted

## 2021-12-28 ENCOUNTER — Ambulatory Visit: Payer: Self-pay | Admitting: *Deleted

## 2021-12-28 NOTE — Patient Outreach (Signed)
  Care Coordination   Follow Up Visit Note   12/28/2021  Name: Paul [name] MRN: [id number] DOB: Sep 14, 1950  Paul [name] is a 71 y.o. year old male who sees Early, [name], NP for primary care. I spoke with Paul [name] by phone today.  What matters to the patients health and wellness today?  Receive Assistance Obtaining Food and Nutritional Services and Resources.    Goals Addressed               This Visit's Progress     COMPLETED: Receive Assistance Obtaining Food and Nutritional Services and Resources. (pt-stated)   On track     Care Coordination Interventions:   Solution-Focused Strategies Employed.  Continued collaboration with Representative from [employer], through [employer] [facility], to Check the Status of Patient's Name on the Waiting List to Receive Prepared Meal Delivery Services.         SDOH assessments and interventions completed:  Yes.  Care Coordination Interventions Activated:  Yes.   Care Coordination Interventions:  Yes, provided.   Follow up plan: No further intervention required.   Encounter Outcome:  Pt. Visit Completed.   [name], BSW, MSW, LCSW  Licensed Education officer, environmental [facility] System  Mailing [place] [address] [place], [place] [address] Physical Address[address] [place], [place] [address] Toll Free Main # [phone] Fax # [phone] Cell # [phone] Di [email or web address]

## 2021-12-28 NOTE — Patient Instructions (Signed)
Visit Information  Thank you for taking time to visit with me today. Please don't hesitate to contact me if I can be of assistance to you.   Following are the goals we discussed today:   Goals Addressed               This Visit's Progress     COMPLETED: Receive Assistance Obtaining Food and Nutritional Services and Resources. (pt-stated)   On track     Care Coordination Interventions:   Solution-Focused Strategies Employed.  Continued collaboration with Representative from [employer], through [employer] [facility], to Check the Status of Patient's Name on the Waiting List to Receive Prepared Meal Delivery Services.        Please call the care guide team at [phone] if you need to cancel or reschedule your appointment.   If you are experiencing a Mental Health or [place] or need someone to talk to, please call the Suicide and Crisis Lifeline: 988 call the [place] National Suicide Prevention Lifeline: 931 446 5820 or TTY: (670)309-8853 TTY (209) 063-5977) to talk to a trained counselor call 1-800-273-TALK (toll free, 24 hour hotline) go to [facility] [address] [place] [phone]) call the [place]: [phone] call 911  Patient verbalizes understanding of instructions and care plan provided today and agrees to view in [place]. Active MyChart status and patient understanding of how to access instructions and care plan via MyChart confirmed with patient.     No further follow up required.  [name], BSW, MSW, LCSW  Licensed Education officer, environmental [facility] System  Mailing [place] [address], [place], [place] [address] Physical Address[address], [place], [place] [address] Toll Free Main # [phone] Fax # [phone] Cell # [phone] [email or web address]

## 2022-01-02 ENCOUNTER — Telehealth (HOSPITAL_COMMUNITY): Payer: Self-pay

## 2022-01-02 NOTE — Telephone Encounter (Signed)
No response from pt regarding PR.   Closed referral. 

## 2022-01-09 ENCOUNTER — Other Ambulatory Visit (HOSPITAL_COMMUNITY): Payer: Self-pay

## 2022-01-19 ENCOUNTER — Other Ambulatory Visit (HOSPITAL_COMMUNITY): Payer: Self-pay

## 2022-01-31 ENCOUNTER — Ambulatory Visit (INDEPENDENT_AMBULATORY_CARE_PROVIDER_SITE_OTHER): Payer: Medicare Other | Admitting: Nurse Practitioner

## 2022-01-31 ENCOUNTER — Other Ambulatory Visit (HOSPITAL_COMMUNITY): Payer: Self-pay

## 2022-01-31 ENCOUNTER — Encounter (HOSPITAL_BASED_OUTPATIENT_CLINIC_OR_DEPARTMENT_OTHER): Payer: Self-pay | Admitting: Nurse Practitioner

## 2022-01-31 VITALS — BP 129/64 | HR 74 | Ht 65.0 in | Wt 180.0 lb

## 2022-01-31 DIAGNOSIS — E1165 Type 2 diabetes mellitus with hyperglycemia: Secondary | ICD-10-CM | POA: Diagnosis not present

## 2022-01-31 DIAGNOSIS — M79672 Pain in left foot: Secondary | ICD-10-CM

## 2022-01-31 DIAGNOSIS — M79671 Pain in right foot: Secondary | ICD-10-CM | POA: Diagnosis not present

## 2022-01-31 DIAGNOSIS — Z23 Encounter for immunization: Secondary | ICD-10-CM

## 2022-01-31 DIAGNOSIS — M79604 Pain in right leg: Secondary | ICD-10-CM

## 2022-01-31 DIAGNOSIS — E1142 Type 2 diabetes mellitus with diabetic polyneuropathy: Secondary | ICD-10-CM | POA: Diagnosis not present

## 2022-01-31 DIAGNOSIS — M79605 Pain in left leg: Secondary | ICD-10-CM | POA: Diagnosis not present

## 2022-01-31 DIAGNOSIS — Z794 Long term (current) use of insulin: Secondary | ICD-10-CM

## 2022-01-31 DIAGNOSIS — E1140 Type 2 diabetes mellitus with diabetic neuropathy, unspecified: Secondary | ICD-10-CM

## 2022-01-31 MED ORDER — GABAPENTIN 300 MG PO CAPS: 600.0000 mg | ORAL_CAPSULE | Freq: Three times a day (TID) | ORAL | 3 refills | Status: DC

## 2022-01-31 NOTE — Patient Instructions (Addendum)
I have increased your Gabapentin to 2 pills THREE times a day to see if this will help with the pain in your feet. We can increase this more if we need to.  While you wait for the new prescription to come in, you can take the gabapentin pills you have at home to equal the same dose.    You can try capsaicin cream to the feet to see if this helps with the nerve pain in the feet. You can apply this directly to the feet a few times a day. Be sure to wash your hands after you use it because it can burn your eyes or mouth if you touch those after rubbing it in. This can be purchased over the counter at the pharmacy.   When you get home if you find out the name of the medicine please call the office at [phone] and select to speak to [facility] nurse and leave the medication as a message.   I have sent the referral to the vein and vascular doctor to see if there are blockages in the legs that can causing the pain.

## 2022-01-31 NOTE — Progress Notes (Signed)
Worthy Keeler, DNP, AGNP-c [place], [place] [address] [phone] Office [phone] Fax  ESTABLISHED PATIENT- Chronic Health and/or Follow-Up Visit  Blood pressure 129/64, pulse 74, height '5\' 5"'$  (1.651 m), weight 180 lb (81.6 kg), SpO2 98 %.    Paul [name] is a 71 y.o. year old male presenting today for evaluation and management of the following: Follow-up (Patient presents today for bilateral foot pain. Patient would like flu and  phenomena shot while in the office. )  Burning pain in the feet bilaterally  He tells me he cant walk from his truck to the seats to go to a race and this is something he really enjoys and would like to do again. He reports frustration over the pain and ongoing difficulty getting up and moving around.  He was speaking to a friend and he recommended a medicatoin that was over the counter that start with an A- he is unsure of the name of it. He would like to know if this is ok for him to take. (Later evaluation shows medication alpha lipoic acid).  He denies discoloration of the feet, cold feet, or changes in his toenails.  He denies lower extremity edema.   All ROS negative with exception of what is listed above.   PHYSICAL EXAM Physical Exam Vitals and nursing note reviewed.  Constitutional:      Appearance: Normal appearance.  HENT:     Head: Normocephalic.  Neck:     Vascular: No carotid bruit.  Cardiovascular:     Rate and Rhythm: Normal rate and regular rhythm.     Pulses:          Dorsalis pedis pulses are 1+ on the right side and 1+ on the left side.       Posterior tibial pulses are 1+ on the right side and 1+ on the left side.     Heart sounds: Normal heart sounds.  Pulmonary:     Effort: Pulmonary effort is normal.     Breath sounds: Normal breath sounds.  Abdominal:     General: Bowel sounds are normal.     Palpations: Abdomen is soft.  Musculoskeletal:         General: Normal range of motion.     Cervical back: Normal range of motion.     Right lower leg: No edema.     Left lower leg: No edema.     Right foot: Normal range of motion.     Left foot: Normal range of motion.  Feet:     Right foot:     Protective Sensation: 10 sites tested.  10 sites sensed.     Skin integrity: Callus and dry skin present.     Toenail Condition: Right toenails are normal.     Left foot:     Protective Sensation: 10 sites tested.  10 sites sensed.     Skin integrity: Callus and dry skin present.     Toenail Condition: Left toenails are normal.  Skin:    General: Skin is warm and dry.     Capillary Refill: Capillary refill takes less than 2 seconds.  Neurological:     Mental Status: He is alert and oriented to person, place, and time.  Psychiatric:        Mood and Affect: Mood normal.        Behavior: Behavior normal.        Thought Content: Thought content normal.  Judgment: Judgment normal.     PLAN Problem List Items Addressed This Visit     Bilateral leg and foot pain - Primary    Burning sensation and pain in bilateral lower extremities in the setting of diabetes and history of cardiovascular disease.  Unclear at this time if symptoms are related to diabetic neuropathy or possible claudication.  No alarm symptoms present at this time.  Patient is able to sense all 10 sites assessed on the foot which is reassuring.  Coloration is normal however temperature is cool to touch.  Pedal pulses are present but weak.  Discussion with patient on recommendations.  We will plan to increase gabapentin dose to help with pain control and send referral to vascular specialist for further evaluation.  Patient encouraged to work on diabetes control.      Relevant Orders   Ambulatory referral to Vascular Surgery   Poorly controlled type 2 diabetes mellitus with peripheral neuropathy (HCC)   Relevant Medications   gabapentin (NEURONTIN) 300 MG capsule   Other  Visit Diagnoses     Type 2 diabetes mellitus with diabetic neuropathy, with long-term current use of insulin (HCC)       Relevant Medications   gabapentin (NEURONTIN) 300 MG capsule   Flu vaccine need       Relevant Orders   Flu vaccine, recombinat, quadrivalent, inj (Completed)   Need for pneumococcal 20-valent conjugate vaccination       Relevant Orders   Pneumococcal conjugate vaccine 20-valent (Prevnar 20) (Completed)       Return in about 3 months (around 05/03/2022) for DM.   Worthy Keeler, DNP, AGNP-c 01/31/2022  2:37 PM

## 2022-02-03 ENCOUNTER — Other Ambulatory Visit (HOSPITAL_BASED_OUTPATIENT_CLINIC_OR_DEPARTMENT_OTHER): Payer: Self-pay | Admitting: Nurse Practitioner

## 2022-02-03 ENCOUNTER — Other Ambulatory Visit (HOSPITAL_COMMUNITY): Payer: Self-pay

## 2022-02-03 DIAGNOSIS — F5104 Psychophysiologic insomnia: Secondary | ICD-10-CM

## 2022-02-06 ENCOUNTER — Other Ambulatory Visit (HOSPITAL_COMMUNITY): Payer: Self-pay

## 2022-02-06 ENCOUNTER — Other Ambulatory Visit: Payer: Self-pay | Admitting: *Deleted

## 2022-02-06 ENCOUNTER — Other Ambulatory Visit (HOSPITAL_BASED_OUTPATIENT_CLINIC_OR_DEPARTMENT_OTHER): Payer: Self-pay | Admitting: Nurse Practitioner

## 2022-02-06 DIAGNOSIS — I739 Peripheral vascular disease, unspecified: Secondary | ICD-10-CM

## 2022-02-06 DIAGNOSIS — F5104 Psychophysiologic insomnia: Secondary | ICD-10-CM

## 2022-02-07 ENCOUNTER — Other Ambulatory Visit (HOSPITAL_COMMUNITY): Payer: Self-pay

## 2022-02-07 MED ORDER — ZOLPIDEM TARTRATE 10 MG PO TABS
10.0000 mg | ORAL_TABLET | Freq: Every evening | ORAL | 1 refills | Status: DC | PRN
  Filled 2022-02-07: qty 30, 30d supply, fill #0
  Filled 2022-03-07: qty 30, 30d supply, fill #1
  Filled 2022-03-30 – 2022-04-05 (×3): qty 30, 30d supply, fill #2

## 2022-02-10 ENCOUNTER — Telehealth (HOSPITAL_BASED_OUTPATIENT_CLINIC_OR_DEPARTMENT_OTHER): Payer: Self-pay

## 2022-02-10 NOTE — Telephone Encounter (Signed)
Patient needs a refill on gabapentin and the medication upped so he doesn't run out. He stated on voicemail this was discussed in the last appointment. Please advise

## 2022-02-13 NOTE — Telephone Encounter (Signed)
[facility] was [facility]. I will be happy to send in a refill locally to a pharmacy of his choice, if he would like until this is filled.

## 2022-02-13 NOTE — Progress Notes (Unsigned)
[facility] [place]  ASSESSMENT / PLAN: 71 y.o. male with radicular pain in bilateral lower extremities concerning for spinal stenosis.  He has a reassuring clinical exam and noninvasive testing peripheral arterial disease.  I have encouraged him to follow-up with his primary care physician to further work-up his potential radiculopathy.  He can follow-up with me on an as-needed basis.  CHIEF COMPLAINT: Bilateral leg pain  HISTORY OF PRESENT ILLNESS: Paul [name] is a 71 y.o. male referred to clinic for evaluation of bilateral lower extremity pain.  The patient describes radiating pain from his hip down his leg.  This occurs when he walks or stands for long time.  Simply resting does not alleviate his pain.  He needs to sit down for his complete symptom relief.  He also describes some trouble with balance and weakness in his lower extremities.  He has no classic claudication symptoms.  He has no ischemic rest pain.  He has no ulcers about his feet.   Past Medical History:  Diagnosis Date   CAD 11/24/2011   a) Mild-to-moderate 30-40% lesions in the RCA, LAD and Circumflex. b) CULPRIT LESION: long tubular 70-80% lesion in D1 with FFR of 0.7 --> PCI w/ Xience Xpedition DES 2.5 mm x 30 mm (2.65 MM); c) Lexiscan Myoview 11/2013: No Ischemia or Infarct (Inferior Gut Attenuation) EF 63%.   Constipation by delayed colonic transit 06/13/2021   COPD (chronic obstructive pulmonary disease) ([place])    "don't have full case of it; I'm right there at it"   Diabetes mellitus without complication ([place])    Dysphagia    Essential hypertension 11/24/2011   Exertional dyspnea, chronic    Gout    Hyperlipidemia with target LDL less than 70 11/24/2011   Obesity (BMI 30.0-34.9)    OSA (obstructive sleep apnea), uses oxygen at home did not tolerate cpap 11/24/2011   S/P CABG (coronary artery bypass graft)     Past Surgical History:  Procedure Laterality Date   CERVICAL SPINE SURGERY  2012    CORONARY ANGIOPLASTY WITH STENT PLACEMENT  11/23/2011   "1; first one"   CORONARY ARTERY BYPASS GRAFT N/A 01/25/2021   Procedure: CORONARY ARTERY BYPASS GRAFTING (CABG) X2, USING LEFT INTERNAL MAMMARY ARTERY AND LEFT LEG GREATER SAPHENOUS VEIN HARVESTED ENDOSCOPICALLY;  Surgeon: Lajuana Matte, MD;  Location: [facility];  Service: Open Heart Surgery;  Laterality: N/A;   ENDOVEIN HARVEST OF GREATER SAPHENOUS VEIN Bilateral 01/25/2021   Procedure: ENDOVEIN HARVEST OF GREATER SAPHENOUS VEIN;  Surgeon: Lajuana Matte, MD;  Location: [facility];  Service: Open Heart Surgery;  Laterality: Bilateral;   LEFT HEART CATH AND CORONARY ANGIOGRAPHY N/A 12/28/2020   Procedure: LEFT HEART CATH AND CORONARY ANGIOGRAPHY;  Surgeon: Leonie Man, MD;  Location: [facility];  Service: Cardiovascular;  Laterality: N/A;   LEFT HEART CATHETERIZATION WITH CORONARY ANGIOGRAM N/A 11/23/2011   Procedure: LEFT HEART CATHETERIZATION WITH CORONARY ANGIOGRAM;  Surgeon: Leonie Man, MD;  Location: [facility] CATH LAB;  Service: Cardiovascular;  Laterality: N/A;   TEE WITHOUT CARDIOVERSION N/A 01/25/2021   Procedure: TRANSESOPHAGEAL ECHOCARDIOGRAM (TEE);  Surgeon: Lajuana Matte, MD;  Location: [facility];  Service: Open Heart Surgery;  Laterality: N/A;    Family History  Problem Relation Age of Onset   Heart disease Sister    Heart attack Brother    Emphysema Father        smoked   Aneurysm Father     Social History   Socioeconomic  History   Marital status: Divorced    Spouse name: Not on file   Number of children: 3   Years of education: 11th   Highest education level: 11th grade  Occupational History   Not on file  Tobacco Use   Smoking status: Former    Packs/day: 1.00    Years: 40.00    Total pack years: 40.00    Types: Cigarettes    Quit date: 06/10/2016    Years since quitting: 5.6    Passive exposure: Past   Smokeless tobacco: Never   Tobacco comments:    02/18/14- smokes occ cig maybe 3 x  per wk  Vaping Use   Vaping Use: Never used  Substance and Sexual Activity   Alcohol use: Yes    Alcohol/week: 0.0 standard drinks of alcohol    Comment: social 2-3 drink a week   Drug use: No   Sexual activity: Not Currently    Partners: Female  Other Topics Concern   Not on file  Social History Narrative   He is a 71 y.o. divorced father of 83, grandfather 1.   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   He quit smoking in August 2013, after smoking a pack a day for roughly 40 years.   He drinks socially 2-3 drinks a week only.   He does not get routine exercise, mostly due to 2 fatigue and dyspnea. Otherwise been relatively sedentary.   Social Determinants of Health   Financial Resource Strain: High Risk (11/07/2021)   Overall Financial Resource Strain (CARDIA)    Difficulty of Paying Living Expenses: Hard  Food Insecurity: Food Insecurity Present (11/07/2021)   Hunger Vital Sign    Worried About Running Out of Food in the Last Year: Often true    Ran Out of Food in the Last Year: Often true  Transportation Needs: No Transportation Needs (11/07/2021)   PRAPARE - Hydrologist (Medical): No    Lack of Transportation (Non-Medical): No  Physical Activity: Inactive (11/07/2021)   Exercise Vital Sign    Days of Exercise per Week: 0 days    Minutes of Exercise per Session: 0 min  Stress: No Stress Concern Present (11/07/2021)   [place]    Feeling of Stress : Not at all  Social Connections: Moderately Isolated (11/07/2021)   Social Connection and Isolation Panel [NHANES]    Frequency of Communication with Friends and Family: More than three times a week    Frequency of Social Gatherings with Friends and Family: Twice a week    Attends Religious Services: 1  to 4 times per year    Active Member of [employer] or Organizations: No    Attends Archivist Meetings: Never    Marital Status: Divorced  Human resources officer Violence: Not At Risk (11/07/2021)   Humiliation, Afraid, Rape, and Kick questionnaire    Fear of Current or Ex-Partner: No    Emotionally Abused: No    Physically Abused: No    Sexually Abused: No    Allergies  Allergen Reactions   Morphine And Related Itching    Possible itching due to morphine 01/26/21   Cortisone    Other Other (See Comments)    Cats- eyes burn    Current Outpatient Medications  Medication Sig Dispense Refill   acetaminophen (  TYLENOL) 500 MG tablet Take 1,500-2,000 mg by mouth daily.     albuterol (VENTOLIN HFA) 108 (90 Base) MCG/ACT inhaler Inhale 2 puffs into the lungs every 4 (four) hours as needed. For breathing. 18 g 3   allopurinol (ZYLOPRIM) 100 MG tablet Take 1 tablet (100 mg total) by mouth daily. 90 tablet 2   amLODipine (NORVASC) 5 MG tablet Take 5 mg by mouth daily.     aspirin 81 MG EC tablet Take 1 tablet (81 mg total) by mouth daily. 30 tablet 0   gabapentin (NEURONTIN) 300 MG capsule Take 2 capsules (600 mg total) by mouth 3 (three) times daily. For back and foot pain. 540 capsule 3   glucose blood (ONETOUCH VERIO) test strip Use to check blood glucose 3 times daily 300 each 3   Glycopyrrolate-Formoterol (BEVESPI AEROSPHERE) 9-4.8 MCG/ACT AERO Inhale 2 puffs into the lungs 2 (two) times daily. 10.7 g 5   [START ON 02/18/2022] HYDROcodone-acetaminophen (NORCO/VICODIN) 5-325 MG tablet Take 1 tablet by mouth every 8 (eight) hours as needed for pain 90 tablet 0   insulin detemir (LEVEMIR FLEXTOUCH) 100 UNIT/ML FlexPen Inject 40 Units into the skin daily. 54 mL 3   insulin lispro (HUMALOG KWIKPEN) 200 UNIT/ML KwikPen Inject 14 Units into the skin every morning AND 14 Units daily with lunch AND 14Units daily with supper. 15 mL 3   Insulin Pen Needle 32G X 4 MM MISC Use daily with insulin as  directed 100 each 3   metoprolol tartrate (LOPRESSOR) 25 MG tablet Take 1 tablet (25 mg total) by mouth 2 (two) times daily. 180 tablet 2   pantoprazole (PROTONIX) 40 MG tablet Take 1 tablet (40 mg total) by mouth daily. 90 tablet 2   Tetrahydrozoline HCl (VISINE EXTRA OP) Place 1 drop into both eyes as needed (itching).     Tiotropium Bromide-Olodaterol (STIOLTO RESPIMAT) 2.5-2.5 MCG/ACT AERS Inhale 2 puffs into the lungs daily. 4 g 5   umeclidinium-vilanterol (ANORO ELLIPTA) 62.5-25 MCG/ACT AEPB Inhale 1 puff into the lungs daily at 6 (six) AM. 90 each 3   zolpidem (AMBIEN) 10 MG tablet Take 1 tablet (10 mg total) by mouth at bedtime as needed for sleep 90 tablet 1   No current facility-administered medications for this visit.    PHYSICAL EXAM Vitals:   02/14/22 1301  BP: (!) 143/76  Pulse: 74  Resp: 20  Temp: 98.1 F (36.7 C)  SpO2: 94%  Weight: 185 lb (83.9 kg)  Height: '5\' 5"'$  (1.651 m)    Chronically ill-appearing elderly gentleman in no acute distress Regular rate and rhythm Unlabored breathing 2+ anterior tibial pulses bilaterally   PERTINENT LABORATORY AND RADIOLOGIC DATA  Most recent CBC    Latest Ref Rng & Units 10/28/2021   12:59 PM 10/26/2021   11:14 AM 06/14/2021    3:09 PM  CBC  WBC 3.4 - 10.8 x10E3/uL 7.5  6.6  6.8   Hemoglobin 13.0 - 17.7 g/dL 11.3  10.5  10.6   Hematocrit 37.5 - 51.0 % 38.4  36.0  34.4   Platelets 150 - 450 x10E3/uL 178  165  172      Most recent CMP    Latest Ref Rng & Units 10/28/2021   12:59 PM 10/26/2021   11:14 AM 06/14/2021    3:09 PM  CMP  Glucose 70 - 99 mg/dL 287  252  171   BUN 8 - 27 mg/dL '17  14  15   '$ Creatinine 0.76 - 1.27 mg/dL  1.02  0.88  0.97   Sodium 134 - 144 mmol/L 137  135  141   Potassium 3.5 - 5.2 mmol/L 5.1  4.3  4.4   Chloride 96 - 106 mmol/L 99  107  105   CO2 20 - 29 mmol/L '20  20  20   '$ Calcium 8.6 - 10.2 mg/dL 9.6  8.8  9.5   Total Protein 6.0 - 8.5 g/dL 7.2   7.4   Total Bilirubin 0.0 - 1.2 mg/dL 0.6    0.4   Alkaline Phos 44 - 121 IU/L 130   122   AST 0 - 40 IU/L 32   25   ALT 0 - 44 IU/L 23   19     Renal function CrCl cannot be calculated (Patient's most recent lab result is older than the maximum 21 days allowed.).  Hgb A1c MFr Bld (%)  Date Value  10/28/2021 12.0 (H)    LDL Chol Calc (NIH)  Date Value Ref Range Status  06/14/2021 23 0 - 99 mg/dL Final     +-------+-----------+-----------+------------+------------+  ABI/TBIToday's ABIToday's TBIPrevious ABIPrevious TBI  +-------+-----------+-----------+------------+------------+  Right  1.25       0.96                                 +-------+-----------+-----------+------------+------------+  Left   1.20       0.75                                 +-------+-----------+-----------+------------+------------+   Yevonne Aline. Stanford Breed, MD [facility] Phone Number: [phone] 02/14/2022 3:11 PM   Total time spent on preparing this encounter including chart review, data review, collecting history, examining the patient, coordinating care for this new patient, 45 minutes.  Portions of this report may have been transcribed using voice recognition software.  Every effort has been made to ensure accuracy; however, inadvertent computerized transcription errors may still be present.

## 2022-02-14 ENCOUNTER — Encounter: Payer: Self-pay | Admitting: Vascular Surgery

## 2022-02-14 ENCOUNTER — Ambulatory Visit (HOSPITAL_COMMUNITY)
Admission: RE | Admit: 2022-02-14 | Discharge: 2022-02-14 | Disposition: A | Discharge status: Home / Self Care | Payer: Medicare Other | Source: Ambulatory Visit | Attending: Vascular Surgery | Admitting: Vascular Surgery

## 2022-02-14 ENCOUNTER — Ambulatory Visit (INDEPENDENT_AMBULATORY_CARE_PROVIDER_SITE_OTHER): Payer: Medicare Other | Admitting: Vascular Surgery

## 2022-02-14 VITALS — BP 143/76 | HR 74 | Temp 98.1°F | Resp 20 | Ht 65.0 in | Wt 185.0 lb

## 2022-02-14 DIAGNOSIS — M79604 Pain in right leg: Secondary | ICD-10-CM

## 2022-02-14 DIAGNOSIS — M79605 Pain in left leg: Secondary | ICD-10-CM

## 2022-02-14 DIAGNOSIS — I739 Peripheral vascular disease, unspecified: Secondary | ICD-10-CM | POA: Insufficient documentation

## 2022-02-14 HISTORY — DX: Pain in right leg: M79.604

## 2022-02-14 NOTE — Assessment & Plan Note (Signed)
Burning sensation and pain in bilateral lower extremities in the setting of diabetes and history of cardiovascular disease.  Unclear at this time if symptoms are related to diabetic neuropathy or possible claudication.  No alarm symptoms present at this time.  Patient is able to sense all 10 sites assessed on the foot which is reassuring.  Coloration is normal however temperature is cool to touch.  Pedal pulses are present but weak.  Discussion with patient on recommendations.  We will plan to increase gabapentin dose to help with pain control and send referral to vascular specialist for further evaluation.  Patient encouraged to work on diabetes control.

## 2022-02-15 ENCOUNTER — Ambulatory Visit (HOSPITAL_BASED_OUTPATIENT_CLINIC_OR_DEPARTMENT_OTHER): Payer: Medicare Other | Admitting: Nurse Practitioner

## 2022-02-15 ENCOUNTER — Telehealth (HOSPITAL_BASED_OUTPATIENT_CLINIC_OR_DEPARTMENT_OTHER): Payer: Self-pay

## 2022-02-15 NOTE — Telephone Encounter (Signed)
Spoke with patient per Laretta Bolster didn't

## 2022-02-15 NOTE — Progress Notes (Deleted)
I called and spoke with patient regarding appointment today 02/15/2022. Per Paul [name], he doesn't need to be seen today as we just seen him.

## 2022-02-16 ENCOUNTER — Other Ambulatory Visit (HOSPITAL_COMMUNITY): Payer: Self-pay

## 2022-02-17 ENCOUNTER — Other Ambulatory Visit (HOSPITAL_COMMUNITY): Payer: Self-pay

## 2022-02-22 ENCOUNTER — Ambulatory Visit (INDEPENDENT_AMBULATORY_CARE_PROVIDER_SITE_OTHER): Payer: Medicare Other | Admitting: Nurse Practitioner

## 2022-02-22 ENCOUNTER — Encounter (HOSPITAL_BASED_OUTPATIENT_CLINIC_OR_DEPARTMENT_OTHER): Payer: Self-pay | Admitting: Nurse Practitioner

## 2022-02-22 VITALS — BP 144/77 | HR 71 | Ht 65.0 in | Wt 185.0 lb

## 2022-02-22 DIAGNOSIS — R531 Weakness: Secondary | ICD-10-CM

## 2022-02-22 DIAGNOSIS — W19XXXA Unspecified fall, initial encounter: Secondary | ICD-10-CM

## 2022-02-22 DIAGNOSIS — R5383 Other fatigue: Secondary | ICD-10-CM

## 2022-02-22 DIAGNOSIS — E1142 Type 2 diabetes mellitus with diabetic polyneuropathy: Secondary | ICD-10-CM | POA: Diagnosis not present

## 2022-02-22 DIAGNOSIS — E1165 Type 2 diabetes mellitus with hyperglycemia: Secondary | ICD-10-CM | POA: Diagnosis not present

## 2022-02-22 DIAGNOSIS — D509 Iron deficiency anemia, unspecified: Secondary | ICD-10-CM | POA: Diagnosis not present

## 2022-02-22 LAB — POCT GLYCOSYLATED HEMOGLOBIN (HGB A1C): Hemoglobin A1C: 9.6 % — AB (ref 4.0–5.6)

## 2022-02-22 MED ORDER — AMBULATORY NON FORMULARY MEDICATION: 0 refills | Status: DC

## 2022-02-22 MED ORDER — HUMALOG KWIKPEN 200 UNIT/ML ~~LOC~~ SOPN: PEN_INJECTOR | SUBCUTANEOUS | 3 refills | Status: DC

## 2022-02-22 NOTE — Patient Instructions (Addendum)
I would like you to take 18 units of insulin lispro with every meal (breakfast, lunch, and dinner).  I would like you to take 40 units of insulin detemir every morning.  I would like you to check your blood sugar three times a day, after your shot, and write down what your blood sugar so we know what these levels are. I want you to do this for two weeks.   I want to recheck your iron levels. If these are low again, we may need to consider iron infusions.   I want to see how things are on 11/16 or 11/17 with a phone call.   Let us know when you can bring the blood sugar machine by and we will help you get that set up.

## 2022-02-22 NOTE — Progress Notes (Signed)
Paul Keeler, DNP, AGNP-c [address] [place], [place] [address] [phone] Office [phone] Fax  ESTABLISHED PATIENT- Chronic Health and/or Follow-Up Visit  Blood pressure (!) 144/77, pulse 71, height '5\' 5"'$  (1.651 m), weight 185 lb (83.9 kg), SpO2 96 %.    Paul [name] is a 71 y.o. year old male presenting today for evaluation and management of the following: Follow-up (Pt presents today, he had a fall yesterday. He has no energy. He states he is taking his medication. He states he lost his balance. While talking to pt he is very out of breath)  [name] endorses difficulty with balance, frequent falls, fatigue, weakness, and shortness of breath. He tells me he is hurting all over and has a generalized feeling of unwell. He has been taking 2 pain pills in the morning and 1 in the afternoon and this is not helping.   He also tells me he is not sure how much insulin he should be on and he is not routinely monitoring his blood sugar, but he is watching his diet. He reports that he is not drinking much water.  All ROS negative with exception of what is listed above.   PHYSICAL EXAM Physical Exam Vitals and nursing note reviewed.  Constitutional:      Appearance: He is ill-appearing.  HENT:     Head: Normocephalic and atraumatic.     Right Ear: Tympanic membrane normal.     Left Ear: Tympanic membrane normal.     Nose: Nose normal.     Mouth/Throat:     Mouth: Mucous membranes are moist.  Eyes:     Extraocular Movements: Extraocular movements intact.     Conjunctiva/sclera: Conjunctivae normal.     Pupils: Pupils are equal, round, and reactive to light.  Neck:     Vascular: No carotid bruit.  Cardiovascular:     Rate and Rhythm: Normal rate and regular rhythm.     Pulses: Normal pulses.     Heart sounds: Normal heart sounds.  Pulmonary:     Effort: Accessory muscle usage and prolonged expiration present.      Breath sounds: Wheezing present.  Abdominal:     General: There is no distension.     Palpations: Abdomen is soft.     Tenderness: There is no abdominal tenderness. There is no guarding.  Musculoskeletal:     Cervical back: Normal range of motion.     Right lower leg: No edema.     Left lower leg: No edema.  Skin:    General: Skin is warm and dry.     Capillary Refill: Capillary refill takes less than 2 seconds.  Neurological:     General: No focal deficit present.     Mental Status: He is alert and oriented to person, place, and time.     Motor: Weakness present.     Gait: Gait abnormal.  Psychiatric:        Mood and Affect: Mood normal.     PLAN Problem List Items Addressed This Visit     Fatigue   Relevant Medications   AMBULATORY NON FORMULARY MEDICATION   Other Relevant Orders   CBC with Differential/Platelet (Completed)   Comprehensive metabolic panel (Completed)   Iron, TIBC and Ferritin Panel (Completed)   B12 and Folate Panel (Completed)   Ambulatory referral to Gastroenterology   POCT URINALYSIS DIP (CLINITEK)   CK (Creatine Kinase) (Completed)   Fall   Relevant Medications  AMBULATORY NON FORMULARY MEDICATION   Other Relevant Orders   CBC with Differential/Platelet (Completed)   Comprehensive metabolic panel (Completed)   Iron, TIBC and Ferritin Panel (Completed)   B12 and Folate Panel (Completed)   Ambulatory referral to Gastroenterology   POCT URINALYSIS DIP (CLINITEK)   CK (Creatine Kinase) (Completed)   Weak   Relevant Medications   AMBULATORY NON FORMULARY MEDICATION   Other Relevant Orders   CBC with Differential/Platelet (Completed)   Comprehensive metabolic panel (Completed)   Iron, TIBC and Ferritin Panel (Completed)   B12 and Folate Panel (Completed)   Ambulatory referral to Gastroenterology   POCT URINALYSIS DIP (CLINITEK)   CK (Creatine Kinase) (Completed)   Poorly controlled type 2 diabetes mellitus with peripheral neuropathy (HCC) -  Primary   Relevant Medications   AMBULATORY NON FORMULARY MEDICATION   Other Relevant Orders   POCT glycosylated hemoglobin (Hb A1C) (Completed)   Other Visit Diagnoses     Iron deficiency anemia, unspecified iron deficiency anemia type       Relevant Orders   Ambulatory referral to Hematology / Oncology     Plan to change his diabetes medications to better help with glucose control. I do not feel that this is all of the issue today as he is very symptomatic appearing with shortness of breath and fatigue. I am concerned for other etiology. Labs are pending. He is very weak. I am hesitant to let him go home today, but he is with his brother who has agreed to drive him and stay with him. I strongly have encouraged him to drink plenty of water and rest when he gets home. Avoid walking around and do not drive at this time. Will f/u in 2 weeks.   Return in about 2 weeks (around 03/08/2022) for Can schedule 11/16 or 17.   Paul Keeler, DNP, AGNP-c 02/22/2022  9:47 AM

## 2022-02-23 ENCOUNTER — Other Ambulatory Visit (HOSPITAL_BASED_OUTPATIENT_CLINIC_OR_DEPARTMENT_OTHER): Payer: Self-pay

## 2022-02-23 LAB — COMPREHENSIVE METABOLIC PANEL
ALT: 27 IU/L (ref 0–44)
AST: 31 IU/L (ref 0–40)
Albumin/Globulin Ratio: 1.4 (ref 1.2–2.2)
Albumin: 4.4 g/dL (ref 3.8–4.8)
Alkaline Phosphatase: 117 IU/L (ref 44–121)
BUN/Creatinine Ratio: 13 (ref 10–24)
BUN: 13 mg/dL (ref 8–27)
Bilirubin Total: 0.4 mg/dL (ref 0.0–1.2)
CO2: 20 mmol/L (ref 20–29)
Calcium: 9.1 mg/dL (ref 8.6–10.2)
Chloride: 98 mmol/L (ref 96–106)
Creatinine, Ser: 1.01 mg/dL (ref 0.76–1.27)
Globulin, Total: 3.2 g/dL (ref 1.5–4.5)
Glucose: 274 mg/dL — ABNORMAL HIGH (ref 70–99)
Potassium: 4.9 mmol/L (ref 3.5–5.2)
Sodium: 134 mmol/L (ref 134–144)
Total Protein: 7.6 g/dL (ref 6.0–8.5)
eGFR: 79 mL/min/{1.73_m2} (ref >59)

## 2022-02-23 LAB — CBC WITH DIFFERENTIAL/PLATELET
Basophils Absolute: 0.1 10*3/uL (ref 0.0–0.2)
Basos: 1 %
EOS (ABSOLUTE): 0.3 10*3/uL (ref 0.0–0.4)
Eos: 4 %
Hematocrit: 37.6 % (ref 37.5–51.0)
Hemoglobin: 11.3 g/dL — ABNORMAL LOW (ref 13.0–17.7)
Immature Grans (Abs): 0 10*3/uL (ref 0.0–0.1)
Immature Granulocytes: 0 %
Lymphocytes Absolute: 1.7 10*3/uL (ref 0.7–3.1)
Lymphs: 24 %
MCH: 24.5 pg — ABNORMAL LOW (ref 26.6–33.0)
MCHC: 30.1 g/dL — ABNORMAL LOW (ref 31.5–35.7)
MCV: 82 fL (ref 79–97)
Monocytes Absolute: 0.5 10*3/uL (ref 0.1–0.9)
Monocytes: 7 %
Neutrophils Absolute: 4.7 10*3/uL (ref 1.4–7.0)
Neutrophils: 64 %
Platelets: 181 10*3/uL (ref 150–450)
RBC: 4.61 x10E6/uL (ref 4.14–5.80)
RDW: 14.8 % (ref 11.6–15.4)
WBC: 7.3 10*3/uL (ref 3.4–10.8)

## 2022-02-23 LAB — B12 AND FOLATE PANEL
Folate: 6.4 ng/mL (ref >3.0)
Vitamin B-12: 544 pg/mL (ref 232–1245)

## 2022-02-23 LAB — IRON,TIBC AND FERRITIN PANEL
Ferritin: 17 ng/mL — ABNORMAL LOW (ref 30–400)
Iron Saturation: 7 % — CL (ref 15–55)
Iron: 33 ug/dL — ABNORMAL LOW (ref 38–169)
Total Iron Binding Capacity: 504 ug/dL — ABNORMAL HIGH (ref 250–450)
UIBC: 471 ug/dL — ABNORMAL HIGH (ref 111–343)

## 2022-02-23 LAB — CK: Total CK: 158 U/L (ref 41–331)

## 2022-02-23 MED ORDER — IRON (FERROUS SULFATE) 325 (65 FE) MG PO TABS
325.0000 mg | ORAL_TABLET | Freq: Every day | ORAL | 3 refills | Status: DC
  Filled 2022-02-23 – 2022-02-24 (×2): qty 30, 30d supply, fill #0

## 2022-02-24 ENCOUNTER — Other Ambulatory Visit (HOSPITAL_COMMUNITY): Payer: Self-pay

## 2022-02-24 ENCOUNTER — Other Ambulatory Visit (HOSPITAL_BASED_OUTPATIENT_CLINIC_OR_DEPARTMENT_OTHER): Payer: Self-pay

## 2022-02-27 ENCOUNTER — Other Ambulatory Visit (HOSPITAL_COMMUNITY): Payer: Self-pay

## 2022-02-27 MED ORDER — HYDROCODONE-ACETAMINOPHEN 5-325 MG PO TABS: 1.0000 | ORAL_TABLET | Freq: Three times a day (TID) | ORAL | 0 refills | Status: DC | PRN

## 2022-02-28 ENCOUNTER — Telehealth: Payer: Self-pay | Admitting: Hematology and Oncology

## 2022-02-28 NOTE — Telephone Encounter (Signed)
Scheduled appointment per 11/10 referrals. Patient is aware of appointment date and time. Patient is aware to arrive 15 mins prior to appointment time and to bring updated insurance cards. Patient is aware of location.

## 2022-03-02 ENCOUNTER — Encounter (HOSPITAL_BASED_OUTPATIENT_CLINIC_OR_DEPARTMENT_OTHER): Payer: Self-pay | Admitting: Nurse Practitioner

## 2022-03-02 ENCOUNTER — Ambulatory Visit (INDEPENDENT_AMBULATORY_CARE_PROVIDER_SITE_OTHER): Payer: Medicare Other | Admitting: Nurse Practitioner

## 2022-03-02 VITALS — BP 141/69 | HR 67 | Ht 65.0 in | Wt 185.0 lb

## 2022-03-02 DIAGNOSIS — D509 Iron deficiency anemia, unspecified: Secondary | ICD-10-CM | POA: Diagnosis not present

## 2022-03-02 DIAGNOSIS — R5383 Other fatigue: Secondary | ICD-10-CM

## 2022-03-02 NOTE — Patient Instructions (Addendum)
I will follow along with your appointment with hematology. Let me know if you dont have improvement in your bowel movements.

## 2022-03-03 LAB — IRON,TIBC AND FERRITIN PANEL
Ferritin: 59 ng/mL (ref 30–400)
Iron Saturation: 29 % (ref 15–55)
Iron: 145 ug/dL (ref 38–169)
Total Iron Binding Capacity: 502 ug/dL — ABNORMAL HIGH (ref 250–450)
UIBC: 357 ug/dL — ABNORMAL HIGH (ref 111–343)

## 2022-03-03 LAB — CBC WITH DIFFERENTIAL/PLATELET
Basophils Absolute: 0.1 10*3/uL (ref 0.0–0.2)
Basos: 1 %
EOS (ABSOLUTE): 0.2 10*3/uL (ref 0.0–0.4)
Eos: 4 %
Hematocrit: 38.6 % (ref 37.5–51.0)
Hemoglobin: 12 g/dL — ABNORMAL LOW (ref 13.0–17.7)
Immature Grans (Abs): 0 10*3/uL (ref 0.0–0.1)
Immature Granulocytes: 0 %
Lymphocytes Absolute: 1.7 10*3/uL (ref 0.7–3.1)
Lymphs: 25 %
MCH: 25.3 pg — ABNORMAL LOW (ref 26.6–33.0)
MCHC: 31.1 g/dL — ABNORMAL LOW (ref 31.5–35.7)
MCV: 81 fL (ref 79–97)
Monocytes Absolute: 0.5 10*3/uL (ref 0.1–0.9)
Monocytes: 7 %
Neutrophils Absolute: 4.2 10*3/uL (ref 1.4–7.0)
Neutrophils: 63 %
Platelets: 187 10*3/uL (ref 150–450)
RBC: 4.75 x10E6/uL (ref 4.14–5.80)
RDW: 16.3 % — ABNORMAL HIGH (ref 11.6–15.4)
WBC: 6.7 10*3/uL (ref 3.4–10.8)

## 2022-03-06 ENCOUNTER — Other Ambulatory Visit (HOSPITAL_BASED_OUTPATIENT_CLINIC_OR_DEPARTMENT_OTHER): Payer: Self-pay

## 2022-03-07 ENCOUNTER — Other Ambulatory Visit (HOSPITAL_COMMUNITY): Payer: Self-pay

## 2022-03-07 NOTE — Progress Notes (Unsigned)
[place] [phone]   Fax:([phone] [place] NOTE  [name] Care Team: Leonie Man, MD as PCP - Cardiology (Cardiology)  Hematological/Oncological History # Iron Deficiency Anemia of Unclear Etiology  03/02/2022: WBC 6.7, Hgb 12.0, Plt 187, ANC 4.2, Cr 1.01 03/08/2022: establish care with Dr. Lorenso Courier   CHIEF COMPLAINTS/PURPOSE OF CONSULTATION:  "Iron Deficiency Anemia "  HISTORY OF PRESENTING ILLNESS:  Paul [name] 71 y.o. male with medical history significant for CAD, COPD, DM type II, gout, hyperlipidemia, OSA, and obesity who presents for evaluation of iron deficiency anemia of unclear etiology.  On review of Paul previous records Paul [name] has had an anemia for several months.  On 10/26/2021 he had a white blood cell count 6.6, hemoglobin 10.5, MCV 80.9, and platelets of 165.  More recently on 02/22/2022 [name] had a hemoglobin of 11.3 with an MCV of 82 and platelets of 191.  Most recently on 03/02/2022 he had a white blood cell count 6.7, hemoglobin 12.0, MCV 81, and platelets of 187.  Due to concern for these findings referred to hematology for further evaluation and management.  On exam today Paul [name] reports that he feels worn out and has not been able to "get up and go" for about a year or so.  He notes that he has been struggling since he underwent his open heart surgery Halloween last year.  He also has been having trouble with his diabetes.  He reports that he has to "sit around a lot".  He notes he is not having any overt signs of bleeding though he does have dark stools.  He reports that they can be somewhat tarry and sticky on both Paul toilet bowl and toilet paper.  He reports that he is quite prone to bruising though he does have a dog which frequently jumps on him.  He is taking aspirin.  He notes that he does not have any dietary restrictions and does enjoy eating red meat.  He does not eat a very often, only eating about once per  month.  He notes that he does also enjoy eating sushi as well as vegetables.  On further discussion he reports his family history is remarkable for emphysema in his father and some kind of cancer in his paternal grandfather.  He reports his mother died of CKD and a stomach illness.  He has 2 healthy children.  He is a former smoker having quit 15 years ago.  He notes that he drinks alcohol socially, 1 beer on occasion.  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  He notes that he is most prone to shortness of breath but does not have any lightheadedness or dizziness.  He notes that sometimes he just cannot catch his breath if he gets too excited.  He does endorse having undergone a colonoscopy approximately 4 years ago and describes Paul [name] building.  He otherwise denies any fevers, chills, sweats, nausea, vomiting or diarrhea.  A full 10 point ROS was otherwise negative.  MEDICAL HISTORY:  Past Medical History:  Diagnosis Date   CAD 11/24/2011   a) Mild-to-moderate 30-40% lesions in Paul RCA, LAD and Circumflex. b) CULPRIT LESION: long tubular 70-80% lesion in D1 with FFR of 0.7 --> PCI w/ Xience Xpedition DES 2.5 mm x 30 mm (2.65 MM); c) Lexiscan Myoview 11/2013: No Ischemia or Infarct (Inferior Gut Attenuation) EF 63%.   Constipation by delayed colonic transit  06/13/2021   COPD (chronic obstructive pulmonary disease) ([place])    "don't have full case of it; I'm right there at it"   Diabetes mellitus without complication ([place])    Dysphagia    Essential hypertension 11/24/2011   Exertional dyspnea, chronic    Gout    Hyperlipidemia with target LDL less than 70 11/24/2011   Obesity (BMI 30.0-34.9)    OSA (obstructive sleep apnea), uses oxygen at home did not tolerate cpap 11/24/2011   S/P CABG (coronary artery bypass graft)     SURGICAL HISTORY: Past Surgical History:  Procedure Laterality Date   CERVICAL SPINE SURGERY  2012    CORONARY ANGIOPLASTY WITH STENT PLACEMENT  11/23/2011   "1; first one"   CORONARY ARTERY BYPASS GRAFT N/A 01/25/2021   Procedure: CORONARY ARTERY BYPASS GRAFTING (CABG) X2, USING LEFT INTERNAL MAMMARY ARTERY AND LEFT LEG GREATER SAPHENOUS VEIN HARVESTED ENDOSCOPICALLY;  Surgeon: Lajuana Matte, MD;  Location: [facility];  Service: Open Heart Surgery;  Laterality: N/A;   ENDOVEIN HARVEST OF GREATER SAPHENOUS VEIN Bilateral 01/25/2021   Procedure: ENDOVEIN HARVEST OF GREATER SAPHENOUS VEIN;  Surgeon: Lajuana Matte, MD;  Location: [facility];  Service: Open Heart Surgery;  Laterality: Bilateral;   LEFT HEART CATH AND CORONARY ANGIOGRAPHY N/A 12/28/2020   Procedure: LEFT HEART CATH AND CORONARY ANGIOGRAPHY;  Surgeon: Leonie Man, MD;  Location: [facility];  Service: Cardiovascular;  Laterality: N/A;   LEFT HEART CATHETERIZATION WITH CORONARY ANGIOGRAM N/A 11/23/2011   Procedure: LEFT HEART CATHETERIZATION WITH CORONARY ANGIOGRAM;  Surgeon: Leonie Man, MD;  Location: [facility] CATH [facility];  Service: Cardiovascular;  Laterality: N/A;   TEE WITHOUT CARDIOVERSION N/A 01/25/2021   Procedure: TRANSESOPHAGEAL ECHOCARDIOGRAM (TEE);  Surgeon: Lajuana Matte, MD;  Location: [facility];  Service: Open Heart Surgery;  Laterality: N/A;    SOCIAL HISTORY: Social History   Socioeconomic History   Marital status: Divorced    Spouse name: Not on file   Number of children: 3   Years of education: 11th   Highest education level: 11th grade  Occupational History   Not on file  Tobacco Use   Smoking status: Former    Packs/day: 1.00    Years: 40.00    Total pack years: 40.00    Types: Cigarettes    Quit date: 06/10/2016    Years since quitting: 5.7    Passive exposure: Past   Smokeless tobacco: Never   Tobacco comments:    02/18/14- smokes occ cig maybe 3 x per wk  Vaping Use   Vaping Use: Never used  Substance and Sexual Activity   Alcohol use: Yes    Alcohol/week: 0.0 standard drinks of  alcohol    Comment: social 2-3 drink a week   Drug use: No   Sexual activity: Not Currently    Partners: Female  Other Topics Concern   Not on file  Social History Narrative   He is a 71 y.o. divorced father of 30, grandfather 1.   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   He quit smoking in August 2013, after smoking a pack a day for roughly 40 years.   He drinks socially 2-3 drinks a week only.   He does not get routine exercise, mostly due to 2 fatigue and dyspnea. Otherwise been relatively sedentary.   Social Determinants of Radio broadcast assistant  Strain: High Risk (11/07/2021)   Overall Financial Resource Strain (CARDIA)    Difficulty of Paying Living Expenses: Hard  Food Insecurity: Food Insecurity Present (11/07/2021)   Hunger Vital Sign    Worried About Running Out of Food in Paul Last Year: Often true    Ran Out of Food in Paul Last Year: Often true  Transportation Needs: No Transportation Needs (11/07/2021)   PRAPARE - Hydrologist (Medical): No    Lack of Transportation (Non-Medical): No  Physical Activity: Inactive (11/07/2021)   Exercise Vital Sign    Days of Exercise per Week: 0 days    Minutes of Exercise per Session: 0 min  Stress: No Stress Concern Present (11/07/2021)   [place]    Feeling of Stress : Not at all  Social Connections: Moderately Isolated (11/07/2021)   Social Connection and Isolation Panel [NHANES]    Frequency of Communication with Friends and Family: More than three times a week    Frequency of Social Gatherings with Friends and Family: Twice a week    Attends Religious Services: 1 to 4 times per year    Active Member of [employer] or Organizations: No    Attends Archivist Meetings: Never    Marital Status:  Divorced  Human resources officer Violence: Not At Risk (11/07/2021)   Humiliation, Afraid, Rape, and Kick questionnaire    Fear of Current or Ex-Partner: No    Emotionally Abused: No    Physically Abused: No    Sexually Abused: No    FAMILY HISTORY: Family History  Problem Relation Age of Onset   Heart disease Sister    Heart attack Brother    Emphysema Father        smoked   Aneurysm Father     ALLERGIES:  is allergic to morphine and related, cortisone, and other.  MEDICATIONS:  Current Outpatient Medications  Medication Sig Dispense Refill   metFORMIN (GLUCOPHAGE-XR) 500 MG 24 hr tablet Take 1,000 mg by mouth 2 (two) times daily.     acetaminophen (TYLENOL) 500 MG tablet Take 1,500-2,000 mg by mouth daily.     albuterol (VENTOLIN HFA) 108 (90 Base) MCG/ACT inhaler Inhale 2 puffs into Paul lungs every 4 (four) hours as needed. For breathing. 18 g 3   allopurinol (ZYLOPRIM) 100 MG tablet Take 1 tablet (100 mg total) by mouth daily. 90 tablet 2   AMBULATORY NON FORMULARY MEDICATION Medical alert device as covered by insurance for frequent falls and weakness. 1 each 0   amLODipine (NORVASC) 5 MG tablet Take 5 mg by mouth daily.     aspirin 81 MG EC tablet Take 1 tablet (81 mg total) by mouth daily. 30 tablet 0   gabapentin (NEURONTIN) 300 MG capsule Take 2 capsules (600 mg total) by mouth 3 (three) times daily. For back and foot pain. 540 capsule 3   glucose blood (ONETOUCH VERIO) test strip Use to check blood glucose 3 times daily 300 each 3   Glycopyrrolate-Formoterol (BEVESPI AEROSPHERE) 9-4.8 MCG/ACT AERO Inhale 2 puffs into Paul lungs 2 (two) times daily. 10.7 g 5   HYDROcodone-acetaminophen (NORCO/VICODIN) 5-325 MG tablet Take 1 tablet by mouth every 8 (eight) hours as needed for pain 90 tablet 0   insulin detemir (LEVEMIR FLEXTOUCH) 100 UNIT/ML FlexPen Inject 40 Units into Paul skin daily. 54 mL 3   insulin lispro (HUMALOG KWIKPEN) 200 UNIT/ML KwikPen Inject 18 Units into Paul skin  every  morning AND 18 Units daily with lunch AND 18 Units daily with supper. 15 mL 3   Insulin Pen Needle 32G X 4 MM MISC Use daily with insulin as directed 100 each 3   Iron, Ferrous Sulfate, 325 (65 Fe) MG TABS Take 1 tablet (325 mg) by mouth daily. 30 tablet 3   metoprolol tartrate (LOPRESSOR) 25 MG tablet Take 1 tablet (25 mg total) by mouth 2 (two) times daily. 180 tablet 2   pantoprazole (PROTONIX) 40 MG tablet Take 1 tablet (40 mg total) by mouth daily. 90 tablet 2   Tetrahydrozoline HCl (VISINE EXTRA OP) Place 1 drop into both eyes as needed (itching).     Tiotropium Bromide-Olodaterol (STIOLTO RESPIMAT) 2.5-2.5 MCG/ACT AERS Inhale 2 puffs into Paul lungs daily. 4 g 5   umeclidinium-vilanterol (ANORO ELLIPTA) 62.5-25 MCG/ACT AEPB Inhale 1 puff into Paul lungs daily at 6 (six) AM. 90 each 3   zolpidem (AMBIEN) 10 MG tablet Take 1 tablet (10 mg total) by mouth at bedtime as needed for sleep 90 tablet 1   No current facility-administered medications for this visit.    REVIEW OF SYSTEMS:   Constitutional: ( - ) fevers, ( - )  chills , ( - ) night sweats Eyes: ( - ) blurriness of vision, ( - ) double vision, ( - ) watery eyes Ears, nose, mouth, throat, and face: ( - ) mucositis, ( - ) sore throat Respiratory: ( - ) cough, ( - ) dyspnea, ( - ) wheezes Cardiovascular: ( - ) palpitation, ( - ) chest discomfort, ( - ) lower extremity swelling Gastrointestinal:  ( - ) nausea, ( - ) heartburn, ( - ) change in bowel habits Skin: ( - ) abnormal skin rashes Lymphatics: ( - ) new lymphadenopathy, ( - ) easy bruising Neurological: ( - ) numbness, ( - ) tingling, ( - ) new weaknesses Behavioral/Psych: ( - ) mood change, ( - ) new changes  All other systems were reviewed with Paul [name] and are negative.  PHYSICAL EXAMINATION:  Vitals:   03/08/22 1259  BP: 139/77  Pulse: 71  Resp: 19  Temp: 97.6 F (36.4 C)  SpO2: 96%   Filed Weights   03/08/22 1259  Weight: 190 lb 14.4 oz (86.6 kg)     GENERAL: well appearing elderly Caucasian male in NAD  SKIN: skin color, texture, turgor are normal, no rashes or significant lesions EYES: conjunctiva are pink and non-injected, sclera clear LUNGS: clear to auscultation and percussion with normal breathing effort HEART: regular rate & rhythm and no murmurs and no lower extremity edema Musculoskeletal: no cyanosis of digits and no clubbing  PSYCH: alert & oriented x 3, fluent speech NEURO: no focal motor/sensory deficits  LABORATORY DATA:  I have reviewed Paul data as listed    Latest Ref Rng & Units 03/02/2022    4:50 PM 02/22/2022   11:58 AM 10/28/2021   12:59 PM  CBC  WBC 3.4 - 10.8 x10E3/uL 6.7  7.3  7.5   Hemoglobin 13.0 - 17.7 g/dL 12.0  11.3  11.3   Hematocrit 37.5 - 51.0 % 38.6  37.6  38.4   Platelets 150 - 450 x10E3/uL 187  181  178        Latest Ref Rng & Units 02/22/2022   11:58 AM 10/28/2021   12:59 PM 10/26/2021   11:14 AM  CMP  Glucose 70 - 99 mg/dL 274  287  252   BUN 8 - 27 mg/dL 13  17  14   Creatinine 0.76 - 1.27 mg/dL 1.01  1.02  0.88   Sodium 134 - 144 mmol/L 134  137  135   Potassium 3.5 - 5.2 mmol/L 4.9  5.1  4.3   Chloride 96 - 106 mmol/L 98  99  107   CO2 20 - 29 mmol/L _0 Calcium 8.6 - 10.2 mg/dL 9.1  9.6  8.8   Total Protein 6.0 - 8.5 g/dL 7.6  7.2    Total Bilirubin 0.0 - 1.2 mg/dL 0.4  0.6    Alkaline Phos 44 - 121 IU/L 117  130    AST 0 - 40 IU/L 31  32    ALT 0 - 44 IU/L 27  23       ASSESSMENT & PLAN Paul [name] 71 y.o. male with medical history significant for CAD, COPD, DM type II, gout, hyperlipidemia, OSA, and obesity who presents for evaluation of iron deficiency anemia of unclear etiology.  After review of Paul labs, review of Paul records, and discussion with Paul [name] Paul patients findings are most consistent with iron deficiency anemia of unclear etiology, suspected GI bleeding.  # Iron Deficiency Anemia 2/2 to Unclear Etiology, Suspect GI Bleeding -- Findings are  consistent with iron deficiency anemia of unclear etiology --recommend GI referral for consideration of colonoscopy.  --We will confirm iron deficiency anemia by ordering iron panel and ferritin as well as reticulocytes, CBC, and CMP --Continue ferrous sulfate 325 mg daily with a source of vitamin C --We will plan to proceed with IV iron therapy in order to help bolster Paul [name]'s blood counts if PO iron supplementation is inadequate.  --Plan for return to clinic in 3 months time or sooner if IV iron is required.    Orders Placed This Encounter  Procedures   CBC with Differential ([facility] Only)    Standing Status:   Future    Number of Occurrences:   1    Standing Expiration Date:   03/09/2023   CMP ([facility] only)    Standing Status:   Future    Number of Occurrences:   1    Standing Expiration Date:   03/09/2023   Ferritin    Standing Status:   Future    Number of Occurrences:   1    Standing Expiration Date:   03/09/2023   Iron and Iron Binding Capacity ([facility] only)    Standing Status:   Future    Number of Occurrences:   1    Standing Expiration Date:   03/09/2023   Retic Panel    Standing Status:   Future    Number of Occurrences:   1    Standing Expiration Date:   03/09/2023   Vitamin B12    Standing Status:   Future    Number of Occurrences:   1    Standing Expiration Date:   03/08/2023   Methylmalonic acid, serum    Standing Status:   Future    Number of Occurrences:   1    Standing Expiration Date:   03/08/2023   Folate, Serum    Standing Status:   Future    Number of Occurrences:   1    Standing Expiration Date:   03/08/2023   Multiple Myeloma Panel (SPEP&IFE w/QIG)    Standing Status:   Future    Number of Occurrences:   1    Standing Expiration Date:   03/08/2023  Kappa/lambda light chains    Standing Status:   Future    Number of Occurrences:   1    Standing Expiration Date:   03/08/2023    All questions were answered. Paul [name]  knows to call Paul clinic with any problems, questions or concerns.  A total of more than 60 minutes were spent on this encounter with face-to-face time and non-face-to-face time, including preparing to see Paul [name], ordering tests and/or medications, counseling Paul [name] and coordination of care as outlined above.   Ledell Peoples, MD Department of Hematology/Oncology [place] [facility] Phone: [phone] Pager: [phone] Email: [email or web address]  03/08/2022 2:10 PM

## 2022-03-08 ENCOUNTER — Other Ambulatory Visit: Payer: Self-pay | Admitting: *Deleted

## 2022-03-08 ENCOUNTER — Inpatient Hospital Stay: Payer: Medicare Other | Attending: Hematology and Oncology | Admitting: Hematology and Oncology

## 2022-03-08 ENCOUNTER — Other Ambulatory Visit (HOSPITAL_COMMUNITY): Payer: Self-pay

## 2022-03-08 ENCOUNTER — Other Ambulatory Visit: Payer: Self-pay

## 2022-03-08 ENCOUNTER — Inpatient Hospital Stay: Payer: Medicare Other

## 2022-03-08 ENCOUNTER — Telehealth: Payer: Self-pay | Admitting: Hematology and Oncology

## 2022-03-08 VITALS — BP 139/77 | HR 71 | Temp 97.6°F | Resp 19 | Ht 65.0 in | Wt 190.9 lb

## 2022-03-08 DIAGNOSIS — D500 Iron deficiency anemia secondary to blood loss (chronic): Secondary | ICD-10-CM

## 2022-03-08 DIAGNOSIS — Z87891 Personal history of nicotine dependence: Secondary | ICD-10-CM | POA: Diagnosis not present

## 2022-03-08 DIAGNOSIS — D509 Iron deficiency anemia, unspecified: Secondary | ICD-10-CM

## 2022-03-08 LAB — CMP (CANCER CENTER ONLY)
ALT: 30 U/L (ref 0–44)
AST: 44 U/L — ABNORMAL HIGH (ref 15–41)
Albumin: 4.4 g/dL (ref 3.5–5.0)
Alkaline Phosphatase: 102 U/L (ref 38–126)
Anion gap: 9 (ref 5–15)
BUN: 17 mg/dL (ref 8–23)
CO2: 21 mmol/L — ABNORMAL LOW (ref 22–32)
Calcium: 9.3 mg/dL (ref 8.9–10.3)
Chloride: 107 mmol/L (ref 98–111)
Creatinine: 1.04 mg/dL (ref 0.61–1.24)
GFR, Estimated: >60 mL/min (ref >60)
Glucose, Bld: 172 mg/dL — ABNORMAL HIGH (ref 70–99)
Potassium: 4.6 mmol/L (ref 3.5–5.1)
Sodium: 137 mmol/L (ref 135–145)
Total Bilirubin: 0.4 mg/dL (ref 0.3–1.2)
Total Protein: 7.8 g/dL (ref 6.5–8.1)

## 2022-03-08 LAB — IRON AND IRON BINDING CAPACITY (CC-WL,HP ONLY)
Iron: 135 ug/dL (ref 45–182)
Saturation Ratios: 29 % (ref 17.9–39.5)
TIBC: 473 ug/dL — ABNORMAL HIGH (ref 250–450)
UIBC: 338 ug/dL (ref 117–376)

## 2022-03-08 LAB — CBC WITH DIFFERENTIAL (CANCER CENTER ONLY)
Abs Immature Granulocytes: 0.03 10*3/uL (ref 0.00–0.07)
Basophils Absolute: 0.1 10*3/uL (ref 0.0–0.1)
Basophils Relative: 1 %
Eosinophils Absolute: 0.2 10*3/uL (ref 0.0–0.5)
Eosinophils Relative: 3 %
HCT: 37.6 % — ABNORMAL LOW (ref 39.0–52.0)
Hemoglobin: 11.6 g/dL — ABNORMAL LOW (ref 13.0–17.0)
Immature Granulocytes: 1 %
Lymphocytes Relative: 26 %
Lymphs Abs: 1.6 10*3/uL (ref 0.7–4.0)
MCH: 26.4 pg (ref 26.0–34.0)
MCHC: 30.9 g/dL (ref 30.0–36.0)
MCV: 85.5 fL (ref 80.0–100.0)
Monocytes Absolute: 0.4 10*3/uL (ref 0.1–1.0)
Monocytes Relative: 6 %
Neutro Abs: 3.9 10*3/uL (ref 1.7–7.7)
Neutrophils Relative %: 63 %
Platelet Count: 152 10*3/uL (ref 150–400)
RBC: 4.4 MIL/uL (ref 4.22–5.81)
RDW: 19.3 % — ABNORMAL HIGH (ref 11.5–15.5)
WBC Count: 6.2 10*3/uL (ref 4.0–10.5)
nRBC: 0 % (ref 0.0–0.2)

## 2022-03-08 LAB — RETIC PANEL
Immature Retic Fract: 24.8 % — ABNORMAL HIGH (ref 2.3–15.9)
RBC.: 4.42 MIL/uL (ref 4.22–5.81)
Retic Count, Absolute: 143.7 10*3/uL (ref 19.0–186.0)
Retic Ct Pct: 3.3 % — ABNORMAL HIGH (ref 0.4–3.1)
Reticulocyte Hemoglobin: 32 pg (ref >27.9)

## 2022-03-08 LAB — VITAMIN B12: Vitamin B-12: 275 pg/mL (ref 180–914)

## 2022-03-08 LAB — FOLATE: Folate: 16.9 ng/mL (ref >5.9)

## 2022-03-08 LAB — FERRITIN: Ferritin: 25 ng/mL (ref 24–336)

## 2022-03-08 MED ORDER — IRON (FERROUS SULFATE) 325 (65 FE) MG PO TABS
325.0000 mg | ORAL_TABLET | Freq: Every day | ORAL | 3 refills | Status: DC
  Filled 2022-03-08 – 2022-03-14 (×2): qty 30, 30d supply, fill #0

## 2022-03-08 NOTE — Telephone Encounter (Signed)
Per 11/22 los called and left pt a message about appointment

## 2022-03-10 LAB — KAPPA/LAMBDA LIGHT CHAINS
Kappa free light chain: 46.6 mg/L — ABNORMAL HIGH (ref 3.3–19.4)
Kappa, lambda light chain ratio: 2 — ABNORMAL HIGH (ref 0.26–1.65)
Lambda free light chains: 23.3 mg/L (ref 5.7–26.3)

## 2022-03-13 LAB — MULTIPLE MYELOMA PANEL, SERUM
Albumin SerPl Elph-Mcnc: 3.6 g/dL (ref 2.9–4.4)
Albumin/Glob SerPl: 1.1 (ref 0.7–1.7)
Alpha 1: 0.3 g/dL (ref 0.0–0.4)
Alpha2 Glob SerPl Elph-Mcnc: 0.8 g/dL (ref 0.4–1.0)
B-Globulin SerPl Elph-Mcnc: 1.2 g/dL (ref 0.7–1.3)
Gamma Glob SerPl Elph-Mcnc: 1.3 g/dL (ref 0.4–1.8)
Globulin, Total: 3.6 g/dL (ref 2.2–3.9)
IgA: 290 mg/dL (ref 61–437)
IgG (Immunoglobin G), Serum: 1218 mg/dL (ref 603–1613)
IgM (Immunoglobulin M), Srm: 140 mg/dL (ref 15–143)
Total Protein ELP: 7.2 g/dL (ref 6.0–8.5)

## 2022-03-13 LAB — METHYLMALONIC ACID, SERUM: Methylmalonic Acid, Quantitative: 319 nmol/L (ref 0–378)

## 2022-03-14 ENCOUNTER — Other Ambulatory Visit (HOSPITAL_COMMUNITY): Payer: Self-pay

## 2022-03-15 ENCOUNTER — Telehealth (HOSPITAL_BASED_OUTPATIENT_CLINIC_OR_DEPARTMENT_OTHER): Payer: Self-pay | Admitting: Nurse Practitioner

## 2022-03-15 NOTE — Telephone Encounter (Signed)
Received fax from [facility] on 11/29 @ 4:50. Needing office notes in the last 6 months relating to CGM products they supply pt with. Sent office notes to fax: [phone].

## 2022-03-17 ENCOUNTER — Other Ambulatory Visit (HOSPITAL_COMMUNITY): Payer: Self-pay

## 2022-03-17 MED ORDER — HYDROCODONE-ACETAMINOPHEN 5-325 MG PO TABS
1.0000 | ORAL_TABLET | Freq: Three times a day (TID) | ORAL | 0 refills | Status: DC | PRN
  Filled 2022-03-17: qty 90, 30d supply, fill #0

## 2022-03-21 ENCOUNTER — Telehealth: Payer: Self-pay | Admitting: Cardiology

## 2022-03-21 ENCOUNTER — Telehealth: Payer: Self-pay

## 2022-03-21 NOTE — Telephone Encounter (Signed)
LMTCB

## 2022-03-21 NOTE — Telephone Encounter (Signed)
Pt c/o Shortness Of Breath: STAT if SOB developed within the last 24 hours or pt is noticeably SOB on the phone  1. Are you currently SOB (can you hear that pt is SOB on the phone)? No  2. How long have you been experiencing SOB? 1 year   3. Are you SOB when sitting or when up moving around? Moving around   4. Are you currently experiencing any other symptoms? Pt states that he has issues walking and with low energy. He doesn't know if it's back related or heart related. Please advise.

## 2022-03-21 NOTE — Telephone Encounter (Signed)
Spoke with patient at length regarding recent lab results and persistent fatigue symptoms. Patient educated on ways to help manage fatigue and healthy habits. Patient provided with recent lab results. Per Dr. Lorenso Courier, lab results look good and patient can continue PO oral iron therapy at this time. No current indications for IV iron. Scheduling message sent for 3 month follow-up visit.  Patient encouraged to reach out to cardiologist, as he has not seen them since Feb. 2023. Patient provided with phone number of his cardiology office, as he did not have it on hand. Patient verbalized an understanding of the information and expressed appreciation for call.  Patient knows that he can call anytime with any further questions or concerns.

## 2022-03-22 ENCOUNTER — Telehealth: Payer: Self-pay | Admitting: *Deleted

## 2022-03-22 NOTE — Progress Notes (Deleted)
Cardiology Office Note:    Date:  03/22/2022   ID:  Paul [name], DOB 1951/04/02, MRN [id number]  PCP:  Orma Render, NP   [facility] Providers Cardiologist:  Glenetta Hew, MD { Click to update primary MD,subspecialty MD or APP then REFRESH:1}    Referring MD: No ref. provider found   Chief Complaint: ***  History of Present Illness:    Paul [name] is a *** 71 y.o. male with a hx of CAD s/p PCI and subsequent CABG 01/25/2021 (LIMA-LAD free graft, jumped off vein graft proximally, RSVG-OM), type 2 diabetes, hyperlipidemia, COPD, HTN, OSA, and mild carotid artery disease.   Previously seen by Dr. Ellyn Hack with history of CAD with PCI to D1 11/2011. Last seen 01/2014.  He was seen 12/13/2020 by Dr. Martinique noting increasing precordial pain and exertional dyspnea.  Cardiac catheterization 12/28/2020 showed extensive LAD and circumflex disease with occluded diagonal recommended for cardiothoracic surgery.  He underwent CABG x 2 on 01/25/21 by Dr. Kipp Brood.  He was started on Flomax due to difficulty voiding.  On 01/30/2021 he had an episode where he fell off a chair after coughing with small laceration to nose but otherwise neurologically intact.  Head CT negative for intracranial injury.  He was discharged to SNF.  Seen in follow-up 03/18/2022 with caretakers.  He had viral illness and supportive care, Tessalon recommended.  Still with incisional soreness  Last cardiology clinic visit was 05/19/2021 with Laurann Montana, NP.  Undergoing workup with Ortho and pending neurosurgeon for back and shoulder pain.  Working with PT at home at that time.  Notes some "knots" under his chest wall which are sore and likely keloid scarring.  Gradually increasing his activity with no reported symptoms.  No changes were made to treatment regimen and he was advised to follow-up in 6 months.  He contacted our office on 03/21/2022 to report shortness of breath.  Reports he has issues walking and has low energy  level, does not know if it is related to his back or his heart.  He was scheduled for office visit.   Today, he is here   Past Medical History:  Diagnosis Date   CAD 11/24/2011   a) Mild-to-moderate 30-40% lesions in the RCA, LAD and Circumflex. b) CULPRIT LESION: long tubular 70-80% lesion in D1 with FFR of 0.7 --> PCI w/ Xience Xpedition DES 2.5 mm x 30 mm (2.65 MM); c) Lexiscan Myoview 11/2013: No Ischemia or Infarct (Inferior Gut Attenuation) EF 63%.   Constipation by delayed colonic transit 06/13/2021   COPD (chronic obstructive pulmonary disease) ([place])    "don't have full case of it; I'm right there at it"   Diabetes mellitus without complication ([place])    Dysphagia    Essential hypertension 11/24/2011   Exertional dyspnea, chronic    Gout    Hyperlipidemia with target LDL less than 70 11/24/2011   Obesity (BMI 30.0-34.9)    OSA (obstructive sleep apnea), uses oxygen at home did not tolerate cpap 11/24/2011   S/P CABG (coronary artery bypass graft)     Past Surgical History:  Procedure Laterality Date   CERVICAL SPINE SURGERY  2012   CORONARY ANGIOPLASTY WITH STENT PLACEMENT  11/23/2011   "1; first one"   CORONARY ARTERY BYPASS GRAFT N/A 01/25/2021   Procedure: CORONARY ARTERY BYPASS GRAFTING (CABG) X2, USING LEFT INTERNAL MAMMARY ARTERY AND LEFT LEG GREATER SAPHENOUS VEIN HARVESTED ENDOSCOPICALLY;  Surgeon: Lajuana Matte, MD;  Location: [place];  Service: Open Heart  Surgery;  Laterality: N/A;   ENDOVEIN HARVEST OF GREATER SAPHENOUS VEIN Bilateral 01/25/2021   Procedure: ENDOVEIN HARVEST OF GREATER SAPHENOUS VEIN;  Surgeon: Lajuana Matte, MD;  Location: [place];  Service: Open Heart Surgery;  Laterality: Bilateral;   LEFT HEART CATH AND CORONARY ANGIOGRAPHY N/A 12/28/2020   Procedure: LEFT HEART CATH AND CORONARY ANGIOGRAPHY;  Surgeon: Leonie Man, MD;  Location: [place] [facility];  Service: Cardiovascular;  Laterality: N/A;   LEFT HEART CATHETERIZATION WITH CORONARY  ANGIOGRAM N/A 11/23/2011   Procedure: LEFT HEART CATHETERIZATION WITH CORONARY ANGIOGRAM;  Surgeon: Leonie Man, MD;  Location: [facility] CATH LAB;  Service: Cardiovascular;  Laterality: N/A;   TEE WITHOUT CARDIOVERSION N/A 01/25/2021   Procedure: TRANSESOPHAGEAL ECHOCARDIOGRAM (TEE);  Surgeon: Lajuana Matte, MD;  Location: [place];  Service: Open Heart Surgery;  Laterality: N/A;    Current Medications: No outpatient medications have been marked as taking for the 03/23/22 encounter (Appointment) with Ann Maki, Lanice Schwab, NP.     Allergies:   Morphine and related, Cortisone, and Other   Social History   Socioeconomic History   Marital status: Divorced    Spouse name: Not on file   Number of children: 3   Years of education: 11th   Highest education level: 11th grade  Occupational History   Not on file  Tobacco Use   Smoking status: Former    Packs/day: 1.00    Years: 40.00    Total pack years: 40.00    Types: Cigarettes    Quit date: 06/10/2016    Years since quitting: 5.7    Passive exposure: Past   Smokeless tobacco: Never   Tobacco comments:    02/18/14- smokes occ cig maybe 3 x per wk  Vaping Use   Vaping Use: Never used  Substance and Sexual Activity   Alcohol use: Yes    Alcohol/week: 0.0 standard drinks of alcohol    Comment: social 2-3 drink a week   Drug use: No   Sexual activity: Not Currently    Partners: Female  Other Topics Concern   Not on file  Social History Narrative   He is a 71 y.o. divorced father of 64, grandfather 1.   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   He quit smoking in August 2013, after smoking a pack a day for roughly 40 years.   He drinks socially 2-3 drinks a week only.   He does not get routine exercise, mostly due to 2 fatigue and dyspnea. Otherwise been relatively sedentary.   Social Determinants  of Health   Financial Resource Strain: High Risk (11/07/2021)   Overall Financial Resource Strain (CARDIA)    Difficulty of Paying Living Expenses: Hard  Food Insecurity: Food Insecurity Present (11/07/2021)   Hunger Vital Sign    Worried About Running Out of Food in the Last Year: Often true    Ran Out of Food in the Last Year: Often true  Transportation Needs: No Transportation Needs (11/07/2021)   PRAPARE - Hydrologist (Medical): No    Lack of Transportation (Non-Medical): No  Physical Activity: Inactive (11/07/2021)   Exercise Vital Sign    Days of Exercise per Week: 0 days    Minutes of Exercise per Session: 0 min  Stress: No Stress Concern Present (11/07/2021)   [place]  Stress Questionnaire    Feeling of Stress : Not at all  Social Connections: Moderately Isolated (11/07/2021)   Social Connection and Isolation Panel [NHANES]    Frequency of Communication with Friends and Family: More than three times a week    Frequency of Social Gatherings with Friends and Family: Twice a week    Attends Religious Services: 1 to 4 times per year    Active Member of [employer] or Organizations: No    Attends Music therapist: Never    Marital Status: Divorced     Family History: The patient's ***family history includes Aneurysm in his father; Emphysema in his father; Heart attack in his brother; Heart disease in his sister.  ROS:   Please see the history of present illness.    *** All other systems reviewed and are negative.  Labs/Other Studies Reviewed:    The following studies were reviewed today:  LHC 12/28/20   Prox LAD to Mid LAD lesion is 65% stenosed with 30% stenosed side branch in 1st Diag. (RFR 0.90-0.91)   1st Diag lesion is 100% stenosed. (In-stent Re-stenosis/occlusion)   Mid LAD lesion is 40% stenosed.   Mid LAD to Dist LAD lesion is 75% stenosed. (RFR 0.74)   Ost Cx to Mid Cx lesion is 55%  stenosed with 20% stenosed side branch in 1st Mrg.   Mid Cx lesion is 90% stenosed.   Prox RCA lesion is 40% stenosed.   -------------------------------------------   The left ventricular systolic function is normal.  The left ventricular ejection fraction is 50-55% by visual estimate.   LV end diastolic pressure is normal.   There is no aortic valve stenosis.   SUMMARY Severe 3 Vessel CAD:  100% ISR of Major D1 stent,  focal 65% prox LAD (@ D1) lesion - FFR 0.91 followed by 40% LAD @ SP1 then long 70% mid LAD (FFR 0.74 - SIGNIFICANT).  prox-mid LCx diffuse 55% with focal 90% just after AVG branch in LCx-Lateral OM2. Moderate Prox-mid RCA 35-40% @ bend  Poor LV Gram image, but appears to have preserved LVEF with normal LVEDP (2 D Echo ordered).      RECOMMENDATIONS With extensive LAD and LCx disease and occluded diagonal, best treatment option would likely be CVTS.  He has been on Plavix since his PCI. CVTS consult placed, 2D echo ordered, and Plavix discontinued. Anticipate outpatient CVTS clinic visit with staged CABG after Plavix washout -> otherwise would need to consider extensive PCI of the LAD with focal PCI of LCx and attempted recanalization of occluded D1 stent Will need to continue aggressive risk factor modification/Guideline Directed Medical Management   Echo 12/28/20 1. Left ventricular ejection fraction, by estimation, is 60 to 65%. The  left ventricle has normal function. The left ventricle has no regional  wall motion abnormalities. Left ventricular diastolic parameters are  consistent with Grade I diastolic  dysfunction (impaired relaxation).   2. Right ventricular systolic function is normal. The right ventricular  size is normal.   3. The mitral valve is normal in structure. No evidence of mitral valve  regurgitation. No evidence of mitral stenosis.   4. The aortic valve is normal in structure. Aortic valve regurgitation is  not visualized. No aortic stenosis is  present.   5. The inferior vena cava is normal in size with greater than 50%  respiratory variability, suggesting right atrial pressure of 3 mmHg.  VAS Pre CABG Carotid and Extremities 01/21/21 Right Carotid: Velocities in the right ICA are  consistent with a 1-39%  stenosis.   Left Carotid: Velocities in the left ICA are consistent with a 1-39%  stenosis.  Vertebrals: Bilateral vertebral arteries demonstrate antegrade flow.   Right ABI: Resting right ankle-brachial index is within normal range. No  evidence of significant right lower extremity arterial disease.  Left ABI: Resting left ankle-brachial index is within normal range. No  evidence of significant left lower extremity arterial disease.  Right Upper Extremity: Doppler waveforms decrease >50% with right radial  compression. Doppler waveforms remain within normal limits with right  ulnar compression.  Left Upper Extremity: Doppler waveform obliterate with left radial  compression. Doppler waveforms remain within normal limits with left ulnar  compression.  Recent Labs: 10/28/2021: Magnesium 1.9; TSH 2.330 03/08/2022: ALT 30; BUN 17; Creatinine 1.04; Hemoglobin 11.6; Platelet Count 152; Potassium 4.6; Sodium 137  Recent Lipid Panel    Component Value Date/Time   CHOL 70 (L) 06/14/2021 1509   TRIG 149 06/14/2021 1509   HDL 21 (L) 06/14/2021 1509   CHOLHDL 3.3 06/14/2021 1509   CHOLHDL 6.6 11/23/2011 0846   VLDL 29 11/23/2011 0846   LDLCALC 23 06/14/2021 1509     Risk Assessment/Calculations:   {Does this patient have ATRIAL FIBRILLATION?:[phone]}       Physical Exam:    VS:  There were no vitals taken for this visit.    Wt Readings from Last 3 Encounters:  03/08/22 190 lb 14.4 oz (86.6 kg)  03/02/22 185 lb (83.9 kg)  02/22/22 185 lb (83.9 kg)     GEN: *** Well nourished, well developed in no acute distress HEENT: Normal NECK: No JVD; No carotid bruits CARDIAC: ***RRR, no murmurs, rubs,  gallops RESPIRATORY:  Clear to auscultation without rales, wheezing or rhonchi  ABDOMEN: Soft, non-tender, non-distended MUSCULOSKELETAL:  No edema; No deformity. *** pedal pulses, ***bilaterally SKIN: Warm and dry NEUROLOGIC:  Alert and oriented x 3 PSYCHIATRIC:  Normal affect   EKG:  EKG is *** ordered today.  The ekg ordered today demonstrates ***  No BP recorded.  {Refresh Note OR Click here to enter BP  :1}***    Diagnoses:    No diagnosis found. Assessment and Plan:     CAD s/p CABG: Remote stent 11/2011.  CABG x 2 12/2020.  Hyperlipidemia LDL goal < 70: Hypertension:  Carotid artery stenosis: 1 to 39% bilateral stenosis 01/2021. OSA  {Are you ordering a CV Procedure (e.g. stress test, cath, DCCV, TEE, etc)?   Press F2        :[id number]}   Disposition:  Medication Adjustments/Labs and Tests Ordered: Current medicines are reviewed at length with the patient today.  Concerns regarding medicines are outlined above.  No orders of the defined types were placed in this encounter.  No orders of the defined types were placed in this encounter.   There are no Patient Instructions on file for this visit.   Signed, Emmaline Life, NP  03/22/2022 6:11 PM    [place]

## 2022-03-22 NOTE — Telephone Encounter (Signed)
Referral to [facility] fax'd to [phone]

## 2022-03-23 ENCOUNTER — Ambulatory Visit: Payer: Medicare Other | Attending: Nurse Practitioner | Admitting: Nurse Practitioner

## 2022-03-28 NOTE — Progress Notes (Signed)
t  Cardiology Office Note:    Date:  04/03/2022   ID:  Paul [name], DOB 19-Nov-1950, MRN [id number]  PCP:  Orma Render, NP   [facility] Providers Cardiologist:  Glenetta Hew, MD     Referring MD: Orma Render, NP   Chief Complaint: shortness of breath  History of Present Illness:    Paul [name] is a very pleasant 71 y.o. male with a hx of CAD s/p PCI and subsequent CABG 01/25/2021 (LIMA-LAD free graft, jumped off vein graft proximally, RSVG-OM), type 2 diabetes, hyperlipidemia, COPD, HTN, OSA, and mild carotid artery disease.   Previously seen by Dr. Ellyn Hack with history of CAD with PCI to D1 11/2011. Last seen 01/2014.  He was seen 12/13/2020 by Dr. Martinique noting increasing precordial pain and exertional dyspnea.  Cardiac catheterization 12/28/2020 showed extensive LAD and circumflex disease with occluded diagonal recommended for cardiothoracic surgery.  He underwent CABG x 2 on 01/25/21 by Dr. Kipp Brood.  He was started on Flomax due to difficulty voiding.  On 01/30/2021 he had an episode where he fell off a chair after coughing with small laceration to nose but otherwise neurologically intact.  Head CT negative for intracranial injury.  He was discharged to SNF.  Seen in follow-up 03/18/2022 with caretakers.  He had viral illness and supportive care, Tessalon recommended.  Still with incisional soreness  Last cardiology clinic visit was 05/19/2021 with Laurann Montana, NP.  Undergoing workup with Ortho and pending neurosurgeon for back and shoulder pain.  Working with PT at home at that time.  Notes some "knots" under his chest wall which are sore and likely keloid scarring.  Gradually increasing his activity with no reported symptoms.  No changes were made to treatment regimen and he was advised to follow-up in 6 months.  He contacted our office on 03/21/2022 to report shortness of breath.  Reports he has issues walking and has low energy level, does not know if it is related to his back  or his heart.  He was scheduled for office visit.   Today, he is here for evaluation of shortness of breath and fatigue. Does not feel like he can complete the activities he would like to do. Was "exhausted" by walking in to our office today. Activity is additionally limited by chronic back pain from pinched nerves which also causes pains in his legs and feet. May also have neuropathy. Mild bilateral LE edema. Feels that his abdomen is more bloated. Tries to limit salt, admits he knows he should avoid sugar but is not strict about it. Drinks diet Sprite. He denies chest pain, palpitations, melena,  weakness, presyncope, syncope, orthopnea, and PND.    Past Medical History:  Diagnosis Date   CAD 11/24/2011   a) Mild-to-moderate 30-40% lesions in the RCA, LAD and Circumflex. b) CULPRIT LESION: long tubular 70-80% lesion in D1 with FFR of 0.7 --> PCI w/ Xience Xpedition DES 2.5 mm x 30 mm (2.65 MM); c) Lexiscan Myoview 11/2013: No Ischemia or Infarct (Inferior Gut Attenuation) EF 63%.   Constipation by delayed colonic transit 06/13/2021   COPD (chronic obstructive pulmonary disease) ([place])    "don't have full case of it; I'm right there at it"   Diabetes mellitus without complication ([place])    Dysphagia    Essential hypertension 11/24/2011   Exertional dyspnea, chronic    Gout    Hyperlipidemia with target LDL less than 70 11/24/2011   Obesity (BMI 30.0-34.9)    OSA (obstructive sleep  apnea), uses oxygen at home did not tolerate cpap 11/24/2011   S/P CABG (coronary artery bypass graft)     Past Surgical History:  Procedure Laterality Date   CERVICAL SPINE SURGERY  2012   CORONARY ANGIOPLASTY WITH STENT PLACEMENT  11/23/2011   "1; first one"   CORONARY ARTERY BYPASS GRAFT N/A 01/25/2021   Procedure: CORONARY ARTERY BYPASS GRAFTING (CABG) X2, USING LEFT INTERNAL MAMMARY ARTERY AND LEFT LEG GREATER SAPHENOUS VEIN HARVESTED ENDOSCOPICALLY;  Surgeon: Lajuana Matte, MD;  Location: [place];   Service: Open Heart Surgery;  Laterality: N/A;   ENDOVEIN HARVEST OF GREATER SAPHENOUS VEIN Bilateral 01/25/2021   Procedure: ENDOVEIN HARVEST OF GREATER SAPHENOUS VEIN;  Surgeon: Lajuana Matte, MD;  Location: [place];  Service: Open Heart Surgery;  Laterality: Bilateral;   LEFT HEART CATH AND CORONARY ANGIOGRAPHY N/A 12/28/2020   Procedure: LEFT HEART CATH AND CORONARY ANGIOGRAPHY;  Surgeon: Leonie Man, MD;  Location: [place] [facility];  Service: Cardiovascular;  Laterality: N/A;   LEFT HEART CATHETERIZATION WITH CORONARY ANGIOGRAM N/A 11/23/2011   Procedure: LEFT HEART CATHETERIZATION WITH CORONARY ANGIOGRAM;  Surgeon: Leonie Man, MD;  Location: [facility] C[facility];  Service: Cardiovascular;  Laterality: N/A;   TEE WITHOUT CARDIOVERSION N/A 01/25/2021   Procedure: TRANSESOPHAGEAL ECHOCARDIOGRAM (TEE);  Surgeon: Lajuana Matte, MD;  Location: [place];  Service: Open Heart Surgery;  Laterality: N/A;    Current Medications: Current Meds  Medication Sig   acetaminophen (TYLENOL) 500 MG tablet Take 1,500-2,000 mg by mouth daily.   albuterol (VENTOLIN HFA) 108 (90 Base) MCG/ACT inhaler Inhale 2 puffs into the lungs every 4 (four) hours as needed. For breathing.   allopurinol (ZYLOPRIM) 100 MG tablet Take 1 tablet (100 mg total) by mouth daily.   AMBULATORY NON FORMULARY MEDICATION Medical alert device as covered by insurance for frequent falls and weakness.   amLODipine (NORVASC) 5 MG tablet Take 5 mg by mouth daily.   aspirin 81 MG EC tablet Take 1 tablet (81 mg total) by mouth daily.   furosemide (LASIX) 20 MG tablet Take 1 tablet (20 mg total) by mouth daily.   gabapentin (NEURONTIN) 300 MG capsule Take 2 capsules (600 mg total) by mouth 3 (three) times daily. For back and foot pain.   glucose blood (ONETOUCH VERIO) test strip Use to check blood glucose 3 times daily   Glycopyrrolate-Formoterol (BEVESPI AEROSPHERE) 9-4.8 MCG/ACT AERO Inhale 2 puffs into the lungs 2 (two) times daily.    HYDROcodone-acetaminophen (NORCO/VICODIN) 5-325 MG tablet Take 1 tablet by mouth every 8 (eight) hours as needed fo rpain   insulin detemir (LEVEMIR FLEXTOUCH) 100 UNIT/ML FlexPen Inject 40 Units into the skin daily.   insulin lispro (HUMALOG KWIKPEN) 200 UNIT/ML KwikPen Inject 18 Units into the skin every morning AND 18 Units daily with lunch AND 18 Units daily with supper.   Insulin Pen Needle 32G X 4 MM MISC Use daily with insulin as directed   Iron, Ferrous Sulfate, 325 (65 Fe) MG TABS Take 1 tablet (325 mg) by mouth daily.   metFORMIN (GLUCOPHAGE-XR) 500 MG 24 hr tablet Take 1,000 mg by mouth 2 (two) times daily.   metoprolol tartrate (LOPRESSOR) 25 MG tablet Take 1 tablet (25 mg total) by mouth 2 (two) times daily.   pantoprazole (PROTONIX) 40 MG tablet Take 1 tablet (40 mg total) by mouth daily.   potassium chloride (KLOR-CON M) 10 MEQ tablet Take 1 tablet (10 mEq total) by mouth daily.   Tetrahydrozoline HCl (  VISINE EXTRA OP) Place 1 drop into both eyes as needed (itching).   Tiotropium Bromide-Olodaterol (STIOLTO RESPIMAT) 2.5-2.5 MCG/ACT AERS Inhale 2 puffs into the lungs daily.   umeclidinium-vilanterol (ANORO ELLIPTA) 62.5-25 MCG/ACT AEPB Inhale 1 puff into the lungs daily at 6 (six) AM.   zolpidem (AMBIEN) 10 MG tablet Take 1 tablet (10 mg total) by mouth at bedtime as needed for sleep     Allergies:   Morphine and related, Cortisone, and Other   Social History   Socioeconomic History   Marital status: Divorced    Spouse name: Not on file   Number of children: 3   Years of education: 11th   Highest education level: 11th grade  Occupational History   Not on file  Tobacco Use   Smoking status: Former    Packs/day: 1.00    Years: 40.00    Total pack years: 40.00    Types: Cigarettes    Quit date: 06/10/2016    Years since quitting: 5.8    Passive exposure: Past   Smokeless tobacco: Never   Tobacco comments:    02/18/14- smokes occ cig maybe 3 x per wk  Vaping Use    Vaping Use: Never used  Substance and Sexual Activity   Alcohol use: Yes    Alcohol/week: 0.0 standard drinks of alcohol    Comment: social 2-3 drink a week   Drug use: No   Sexual activity: Not Currently    Partners: Female  Other Topics Concern   Not on file  Social History Narrative   He is a 71 y.o. divorced father of 34, grandfather 1.   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   He quit smoking in August 2013, after smoking a pack a day for roughly 40 years.   He drinks socially 2-3 drinks a week only.   He does not get routine exercise, mostly due to 2 fatigue and dyspnea. Otherwise been relatively sedentary.   Social Determinants of Health   Financial Resource Strain: High Risk (11/07/2021)   Overall Financial Resource Strain (CARDIA)    Difficulty of Paying Living Expenses: Hard  Food Insecurity: Food Insecurity Present (11/07/2021)   Hunger Vital Sign    Worried About Running Out of Food in the Last Year: Often true    Ran Out of Food in the Last Year: Often true  Transportation Needs: No Transportation Needs (11/07/2021)   PRAPARE - Hydrologist (Medical): No    Lack of Transportation (Non-Medical): No  Physical Activity: Inactive (11/07/2021)   Exercise Vital Sign    Days of Exercise per Week: 0 days    Minutes of Exercise per Session: 0 min  Stress: No Stress Concern Present (11/07/2021)   [place]    Feeling of Stress : Not at all  Social Connections: Moderately Isolated (11/07/2021)   Social Connection and Isolation Panel [NHANES]    Frequency of Communication with Friends and Family: More than three times a week    Frequency of Social Gatherings with Friends and Family: Twice a week    Attends Religious Services: 1 to 4 times per year     Active Member of [employer] or Organizations: No    Attends Archivist Meetings: Never    Marital Status: Divorced     Family History: The  patient's family history includes Aneurysm in his father; Emphysema in his father; Heart attack in his brother; Heart disease in his sister.  ROS:   Please see the history of present illness.    +DOE + weakness All other systems reviewed and are negative.  Labs/Other Studies Reviewed:    The following studies were reviewed today:  LHC 12/28/20   Prox LAD to Mid LAD lesion is 65% stenosed with 30% stenosed side branch in 1st Diag. (RFR 0.90-0.91)   1st Diag lesion is 100% stenosed. (In-stent Re-stenosis/occlusion)   Mid LAD lesion is 40% stenosed.   Mid LAD to Dist LAD lesion is 75% stenosed. (RFR 0.74)   Ost Cx to Mid Cx lesion is 55% stenosed with 20% stenosed side branch in 1st Mrg.   Mid Cx lesion is 90% stenosed.   Prox RCA lesion is 40% stenosed.   -------------------------------------------   The left ventricular systolic function is normal.  The left ventricular ejection fraction is 50-55% by visual estimate.   LV end diastolic pressure is normal.   There is no aortic valve stenosis.   SUMMARY Severe 3 Vessel CAD:  100% ISR of Major D1 stent,  focal 65% prox LAD (@ D1) lesion - FFR 0.91 followed by 40% LAD @ SP1 then long 70% mid LAD (FFR 0.74 - SIGNIFICANT).  prox-mid LCx diffuse 55% with focal 90% just after AVG branch in LCx-Lateral OM2. Moderate Prox-mid RCA 35-40% @ bend  Poor LV Gram image, but appears to have preserved LVEF with normal LVEDP (2 D Echo ordered).      RECOMMENDATIONS With extensive LAD and LCx disease and occluded diagonal, best treatment option would likely be CVTS.  He has been on Plavix since his PCI. CVTS consult placed, 2D echo ordered, and Plavix discontinued. Anticipate outpatient CVTS clinic visit with staged CABG after Plavix washout -> otherwise would need to consider extensive PCI of the LAD  with focal PCI of LCx and attempted recanalization of occluded D1 stent Will need to continue aggressive risk factor modification/Guideline Directed Medical Management   Echo 12/28/20 1. Left ventricular ejection fraction, by estimation, is 60 to 65%. The  left ventricle has normal function. The left ventricle has no regional  wall motion abnormalities. Left ventricular diastolic parameters are  consistent with Grade I diastolic  dysfunction (impaired relaxation).   2. Right ventricular systolic function is normal. The right ventricular  size is normal.   3. The mitral valve is normal in structure. No evidence of mitral valve  regurgitation. No evidence of mitral stenosis.   4. The aortic valve is normal in structure. Aortic valve regurgitation is  not visualized. No aortic stenosis is present.   5. The inferior vena cava is normal in size with greater than 50%  respiratory variability, suggesting right atrial pressure of 3 mmHg.  VAS Pre CABG Carotid and Extremities 01/21/21 Right Carotid: Velocities in the right ICA are consistent with a 1-39%  stenosis.   Left Carotid: Velocities in the left ICA are consistent with a 1-39%  stenosis.  Vertebrals: Bilateral vertebral arteries demonstrate antegrade flow.   Right ABI: Resting right ankle-brachial index is within normal range. No  evidence of significant right lower extremity arterial disease.  Left ABI: Resting left ankle-brachial index is within normal range. No  evidence of significant left lower extremity arterial disease.  Right Upper Extremity: Doppler waveforms decrease >50% with right radial  compression. Doppler waveforms remain within normal limits with right  ulnar compression.  Left  Upper Extremity: Doppler waveform obliterate with left radial  compression. Doppler waveforms remain within normal limits with left ulnar  compression.  Recent Labs: 10/28/2021: Magnesium 1.9; TSH 2.330 03/08/2022: ALT 30; BUN 17; Creatinine  1.04; Hemoglobin 11.6; Platelet Count 152; Potassium 4.6; Sodium 137  Recent Lipid Panel    Component Value Date/Time   CHOL 70 (L) 06/14/2021 1509   TRIG 149 06/14/2021 1509   HDL 21 (L) 06/14/2021 1509   CHOLHDL 3.3 06/14/2021 1509   CHOLHDL 6.6 11/23/2011 0846   VLDL 29 11/23/2011 0846   LDLCALC 23 06/14/2021 1509     Risk Assessment/Calculations:      Physical Exam:    VS:  BP 126/78   Pulse 70   Ht '5\' 5"'$  (1.651 m)   Wt 191 lb 6.4 oz (86.8 kg)   SpO2 94%   BMI 31.85 kg/m     Wt Readings from Last 3 Encounters:  04/03/22 191 lb 6.4 oz (86.8 kg)  03/08/22 190 lb 14.4 oz (86.6 kg)  03/02/22 185 lb (83.9 kg)     GEN:  Well nourished, well developed in no acute distress HEENT: Normal NECK: No JVD; No carotid bruits CARDIAC: RRR, no murmurs, rubs, gallops RESPIRATORY:  Clear to auscultation without rales, wheezing or rhonchi  ABDOMEN: Soft, non-tender, non-distended MUSCULOSKELETAL:  Mild bilateral LE edema; No deformity. 2+ pedal pulses, equal bilaterally SKIN: Warm and dry NEUROLOGIC:  Alert and oriented x 3 PSYCHIATRIC:  Normal affect   EKG:  EKG is ordered today.  The ekg ordered today demonstrates normal sinus rhythm at 66 bpm, nonspecific ST abnormality, no acute change from previous   Diagnoses:    1. Coronary artery disease of native artery of native heart with stable angina pectoris ([place])   2. Hyperlipidemia LDL goal <70   3. S/P CABG (coronary artery bypass graft)   4. DOE (dyspnea on exertion)   5. Essential hypertension   6. Bilateral carotid artery stenosis    Assessment and Plan:     DOE: Having worsening shortness of breath and abdominal bloating over the past few weeks. Has mild LE edema. No orthopnea, PND, dyspnea, or chest pain. Does not consistently monitor weight (weight is up on our scale). Will start Lasix 40 mg x 3 days then 20 mg daily, and potassium chloride 10 mEq once daily. Follow-up BMP in 2 weeks. Will get echocardiogram for  evaluation of heart and valve function. Advised low sodium diet < 2000 mg daily.   CAD s/p CABG: Remote stent 11/2011. CABG x 2 12/2020.  He denies chest pain.  As noted above, having worsening DOE and fatigue for which we will get echo. Consider further ischemia evaluation if symptoms persist. Will await echo results.   Hyperlipidemia LDL goal < 70: LDL 23 on 06/14/21. He stopped rosuvastatin. Will recheck lipids at next office visit.    Hypertension: BP is well-controlled.   Carotid artery stenosis: 1 to 39% bilateral stenosis 01/2021. Asymptomatic. Continue to follow clinically at this time.      Disposition: 4-6 weeks with me  Medication Adjustments/Labs and Tests Ordered: Current medicines are reviewed at length with the patient today.  Concerns regarding medicines are outlined above.  Orders Placed This Encounter  Procedures   Basic Metabolic Panel (BMET)   EKG 12-Lead   ECHOCARDIOGRAM COMPLETE   Meds ordered this encounter  Medications   furosemide (LASIX) 20 MG tablet    Sig: Take 1 tablet (20 mg total) by mouth daily.  Dispense:  90 tablet    Refill:  3   potassium chloride (KLOR-CON M) 10 MEQ tablet    Sig: Take 1 tablet (10 mEq total) by mouth daily.    Dispense:  90 tablet    Refill:  3    Patient Instructions  Medication Instructions:   START Lasix two (2) tablet by mouth (40 mg) X 3 days than one (1) tablet by mouth ( 20 mg) daily.  START K-dur one (1) tablet by mouth ( 10 mEq) daily.  *If you need a refill on your cardiac medications before your next appointment, please call your pharmacy*   Lab Work:  Your physician recommends that you return for lab work on Tuesday, January 9. You can come in on the day of your appointment anytime between 7:30-4:30.   If you have labs (blood work) drawn today and your tests are completely normal, you will receive your results only by: [place] (if you have MyChart) OR A paper copy in the mail If you have any  lab test that is abnormal or we need to change your treatment, we will call you to review the results.   Testing/Procedures:  Your physician has requested that you have an echocardiogram. Echocardiography is a painless test that uses sound waves to create images of your heart. It provides your doctor with information about the size and shape of your heart and how well your heart's chambers and valves are working. This procedure takes approximately one hour. There are no restrictions for this procedure. Please do NOT wear cologne, perfume, aftershave, or lotions (deodorant is allowed). Please arrive 15 minutes prior to your appointment time.    Follow-Up: At [facility], you and your health needs are our priority.  As part of our continuing mission to provide you with exceptional heart care, we have created designated Provider Care Teams.  These Care Teams include your primary Cardiologist (physician) and Advanced Practice Providers (APPs -  Physician Assistants and Nurse Practitioners) who all work together to provide you with the care you need, when you need it.  We recommend signing up for the patient portal called "MyChart".  Sign up information is provided on this After Visit Summary.  MyChart is used to connect with patients for Virtual Visits (Telemedicine).  Patients are able to view lab/test results, encounter notes, upcoming appointments, etc.  Non-urgent messages can be sent to your provider as well.   To learn more about what you can do with MyChart, go to [email or web address].    Your next appointment:   5 week(s)  The format for your next appointment:   In Person  Provider:   Christen Bame, NP         Other Instructions  DASH Eating Plan DASH stands for Dietary Approaches to Stop Hypertension. The DASH eating plan is a healthy eating plan that has been shown to: Reduce high blood pressure (hypertension). Reduce your risk for type 2 diabetes, heart disease,  and stroke. Help with weight loss. What are tips for following this plan? Reading food labels Check food labels for the amount of salt (sodium) per serving. Choose foods with less than 5 percent of the Daily Value of sodium. Generally, foods with less than 300 milligrams (mg) of sodium per serving fit into this eating plan. To find whole grains, look for the word "whole" as the first word in the ingredient list. Shopping Buy products labeled as "low-sodium" or "no salt added." Buy fresh foods. Avoid canned  foods and pre-made or frozen meals. Cooking Avoid adding salt when cooking. Use salt-free seasonings or herbs instead of table salt or sea salt. Check with your health care provider or pharmacist before using salt substitutes. Do not fry foods. Cook foods using healthy methods such as baking, boiling, grilling, roasting, and broiling instead. Cook with heart-healthy oils, such as olive, canola, avocado, soybean, or sunflower oil. Meal planning  Eat a balanced diet that includes: 4 or more servings of fruits and 4 or more servings of vegetables each day. Try to fill one-half of your plate with fruits and vegetables. 6-8 servings of whole grains each day. Less than 6 oz (170 g) of lean meat, poultry, or fish each day. A 3-oz (85-g) serving of meat is about the same size as a deck of cards. One egg equals 1 oz (28 g). 2-3 servings of low-fat dairy each day. One serving is 1 cup (237 mL). 1 serving of nuts, seeds, or beans 5 times each week. 2-3 servings of heart-healthy fats. Healthy fats called omega-3 fatty acids are found in foods such as walnuts, flaxseeds, fortified milks, and eggs. These fats are also found in cold-water fish, such as sardines, salmon, and mackerel. Limit how much you eat of: Canned or prepackaged foods. Food that is high in trans fat, such as some fried foods. Food that is high in saturated fat, such as fatty meat. Desserts and other sweets, sugary drinks, and other  foods with added sugar. Full-fat dairy products. Do not salt foods before eating. Do not eat more than 4 egg yolks a week. Try to eat at least 2 vegetarian meals a week. Eat more home-cooked food and less restaurant, buffet, and fast food. Lifestyle When eating at a restaurant, ask that your food be prepared with less salt or no salt, if possible. If you drink alcohol: Limit how much you use to: 0-1 drink a day for women who are not pregnant. 0-2 drinks a day for men. Be aware of how much alcohol is in your drink. In the [place] one drink equals one 12 oz bottle of beer (355 mL), one 5 oz glass of wine (148 mL), or one 1 oz glass of hard liquor (44 mL). General information Avoid eating more than 2,300 mg of salt a day. If you have hypertension, you may need to reduce your sodium intake to 1,500 mg a day. Work with your health care provider to maintain a healthy body weight or to lose weight. Ask what an ideal weight is for you. Get at least 30 minutes of exercise that causes your heart to beat faster (aerobic exercise) most days of the week. Activities may include walking, swimming, or biking. Work with your health care provider or dietitian to adjust your eating plan to your individual calorie needs. What foods should I eat? Fruits All fresh, dried, or frozen fruit. Canned fruit in natural juice (without added sugar). Vegetables Fresh or frozen vegetables (raw, steamed, roasted, or grilled). Low-sodium or reduced-sodium tomato and vegetable juice. Low-sodium or reduced-sodium tomato sauce and tomato paste. Low-sodium or reduced-sodium canned vegetables. Grains Whole-grain or whole-wheat bread. Whole-grain or whole-wheat pasta. Brown rice. [place]. Bulgur. Whole-grain and low-sodium cereals. Pita bread. Low-fat, low-sodium crackers. Whole-wheat flour tortillas. Meats and other proteins Skinless chicken or [place]. Ground chicken or [place]. Pork with fat trimmed off. Fish and seafood.  Egg whites. Dried beans, peas, or lentils. Unsalted nuts, nut butters, and seeds. Unsalted canned beans. Lean cuts of beef with fat  trimmed off. Low-sodium, lean precooked or cured meat, such as sausages or meat loaves. Dairy Low-fat (1%) or fat-free (skim) milk. Reduced-fat, low-fat, or fat-free cheeses. Nonfat, low-sodium ricotta or cottage cheese. Low-fat or nonfat yogurt. Low-fat, low-sodium cheese. Fats and oils Soft margarine without trans fats. Vegetable oil. Reduced-fat, low-fat, or light mayonnaise and salad dressings (reduced-sodium). Canola, safflower, olive, avocado, soybean, and sunflower oils. Avocado. Seasonings and condiments Herbs. Spices. Seasoning mixes without salt. Other foods Unsalted popcorn and pretzels. Fat-free sweets. The items listed above may not be a complete list of foods and beverages you can eat. Contact a dietitian for more information. What foods should I avoid? Fruits Canned fruit in a light or heavy syrup. Fried fruit. Fruit in cream or butter sauce. Vegetables Creamed or fried vegetables. Vegetables in a cheese sauce. Regular canned vegetables (not low-sodium or reduced-sodium). Regular canned tomato sauce and paste (not low-sodium or reduced-sodium). Regular tomato and vegetable juice (not low-sodium or reduced-sodium). Angie Fava. Olives. Grains Baked goods made with fat, such as croissants, muffins, or some breads. Dry pasta or rice meal packs. Meats and other proteins Fatty cuts of meat. Ribs. Fried meat. Berniece Salines. Bologna, salami, and other precooked or cured meats, such as sausages or meat loaves. Fat from the back of a pig (fatback). Bratwurst. Salted nuts and seeds. Canned beans with added salt. Canned or smoked fish. Whole eggs or egg yolks. Chicken or [place] with skin. Dairy Whole or 2% milk, cream, and half-and-half. Whole or full-fat cream cheese. Whole-fat or sweetened yogurt. Full-fat cheese. Nondairy creamers. Whipped toppings. Processed cheese and  cheese spreads. Fats and oils Butter. Stick margarine. Lard. Shortening. Ghee. Bacon fat. Tropical oils, such as coconut, palm kernel, or palm oil. Seasonings and condiments Onion salt, garlic salt, seasoned salt, table salt, and sea salt. Worcestershire sauce. Tartar sauce. Barbecue sauce. Teriyaki sauce. Soy sauce, including reduced-sodium. Steak sauce. Canned and packaged gravies. Fish sauce. Oyster sauce. Cocktail sauce. Store-bought horseradish. Ketchup. Mustard. Meat flavorings and tenderizers. Bouillon cubes. Hot sauces. Pre-made or packaged marinades. Pre-made or packaged taco seasonings. Relishes. Regular salad dressings. Other foods Salted popcorn and pretzels. The items listed above may not be a complete list of foods and beverages you should avoid. Contact a dietitian for more information. Where to find more information National Heart, Lung, and Blood Institute: https://wilson-eaton.com/ American Heart Association: www.heart.org Academy of Nutrition and Dietetics: www.eatright.[place]: www.kidney.org Summary The DASH eating plan is a healthy eating plan that has been shown to reduce high blood pressure (hypertension). It may also reduce your risk for type 2 diabetes, heart disease, and stroke. When on the DASH eating plan, aim to eat more fresh fruits and vegetables, whole grains, lean proteins, low-fat dairy, and heart-healthy fats. With the DASH eating plan, you should limit salt (sodium) intake to 2,300 mg a day. If you have hypertension, you may need to reduce your sodium intake to 1,500 mg a day. Work with your health care provider or dietitian to adjust your eating plan to your individual calorie needs. This information is not intended to replace advice given to you by your health care provider. Make sure you discuss any questions you have with your health care provider. Document Revised: 03/07/2019 Document Reviewed: 03/07/2019 Elsevier Patient Education  [place]         Signed, Emmaline Life, NP  04/03/2022 1:05 PM    [place]

## 2022-03-29 NOTE — Telephone Encounter (Signed)
Spoke with pt, he reports the SOB comes and goes. He does have occasional swelling in his feet and ankles. He denies orthopnea. He has a follow up appointment on Monday and will call prior to that appointment with concerns.

## 2022-03-30 ENCOUNTER — Other Ambulatory Visit (HOSPITAL_COMMUNITY): Payer: Self-pay

## 2022-03-30 ENCOUNTER — Other Ambulatory Visit: Payer: Self-pay

## 2022-04-03 ENCOUNTER — Encounter: Payer: Self-pay | Admitting: Nurse Practitioner

## 2022-04-03 ENCOUNTER — Other Ambulatory Visit (HOSPITAL_COMMUNITY): Payer: Self-pay

## 2022-04-03 ENCOUNTER — Ambulatory Visit: Payer: Medicare Other | Attending: Nurse Practitioner | Admitting: Nurse Practitioner

## 2022-04-03 VITALS — BP 126/78 | HR 70 | Ht 65.0 in | Wt 191.4 lb

## 2022-04-03 DIAGNOSIS — I10 Essential (primary) hypertension: Secondary | ICD-10-CM | POA: Insufficient documentation

## 2022-04-03 DIAGNOSIS — I25118 Atherosclerotic heart disease of native coronary artery with other forms of angina pectoris: Secondary | ICD-10-CM | POA: Diagnosis not present

## 2022-04-03 DIAGNOSIS — I6523 Occlusion and stenosis of bilateral carotid arteries: Secondary | ICD-10-CM | POA: Insufficient documentation

## 2022-04-03 DIAGNOSIS — Z951 Presence of aortocoronary bypass graft: Secondary | ICD-10-CM | POA: Diagnosis not present

## 2022-04-03 DIAGNOSIS — E785 Hyperlipidemia, unspecified: Secondary | ICD-10-CM | POA: Insufficient documentation

## 2022-04-03 DIAGNOSIS — R0609 Other forms of dyspnea: Secondary | ICD-10-CM | POA: Diagnosis not present

## 2022-04-03 MED ORDER — POTASSIUM CHLORIDE CRYS ER 10 MEQ PO TBCR
10.0000 meq | EXTENDED_RELEASE_TABLET | Freq: Every day | ORAL | 3 refills | Status: DC
  Filled 2022-04-03: qty 30, 30d supply, fill #0

## 2022-04-03 MED ORDER — FUROSEMIDE 20 MG PO TABS
20.0000 mg | ORAL_TABLET | Freq: Every day | ORAL | 3 refills | Status: DC
  Filled 2022-04-03: qty 30, 30d supply, fill #0

## 2022-04-03 NOTE — Patient Instructions (Signed)
Medication Instructions:   START Lasix two (2) tablet by mouth (40 mg) X 3 days than one (1) tablet by mouth ( 20 mg) daily.  START K-dur one (1) tablet by mouth ( 10 mEq) daily.  *If you need a refill on your cardiac medications before your next appointment, please call your pharmacy*   Lab Work:  Your physician recommends that you return for lab work on Tuesday, January 9. You can come in on the day of your appointment anytime between 7:30-4:30.   If you have labs (blood work) drawn today and your tests are completely normal, you will receive your results only by: [place] (if you have MyChart) OR A paper copy in the mail If you have any lab test that is abnormal or we need to change your treatment, we will call you to review the results.   Testing/Procedures:  Your physician has requested that you have an echocardiogram. Echocardiography is a painless test that uses sound waves to create images of your heart. It provides your doctor with information about the size and shape of your heart and how well your heart's chambers and valves are working. This procedure takes approximately one hour. There are no restrictions for this procedure. Please do NOT wear cologne, perfume, aftershave, or lotions (deodorant is allowed). Please arrive 15 minutes prior to your appointment time.    Follow-Up: At [facility], you and your health needs are our priority.  As part of our continuing mission to provide you with exceptional heart care, we have created designated Provider Care Teams.  These Care Teams include your primary Cardiologist (physician) and Advanced Practice Providers (APPs -  Physician Assistants and Nurse Practitioners) who all work together to provide you with the care you need, when you need it.  We recommend signing up for the patient portal called "MyChart".  Sign up information is provided on this After Visit Summary.  MyChart is used to connect with patients for  Virtual Visits (Telemedicine).  Patients are able to view lab/test results, encounter notes, upcoming appointments, etc.  Non-urgent messages can be sent to your provider as well.   To learn more about what you can do with MyChart, go to [email or web address].    Your next appointment:   5 week(s)  The format for your next appointment:   In Person  Provider:   Christen Bame, NP         Other Instructions  DASH Eating Plan DASH stands for Dietary Approaches to Stop Hypertension. The DASH eating plan is a healthy eating plan that has been shown to: Reduce high blood pressure (hypertension). Reduce your risk for type 2 diabetes, heart disease, and stroke. Help with weight loss. What are tips for following this plan? Reading food labels Check food labels for the amount of salt (sodium) per serving. Choose foods with less than 5 percent of the Daily Value of sodium. Generally, foods with less than 300 milligrams (mg) of sodium per serving fit into this eating plan. To find whole grains, look for the word "whole" as the first word in the ingredient list. Shopping Buy products labeled as "low-sodium" or "no salt added." Buy fresh foods. Avoid canned foods and pre-made or frozen meals. Cooking Avoid adding salt when cooking. Use salt-free seasonings or herbs instead of table salt or sea salt. Check with your health care provider or pharmacist before using salt substitutes. Do not fry foods. Cook foods using healthy methods such as baking, boiling, grilling, roasting, and  broiling instead. Cook with heart-healthy oils, such as olive, canola, avocado, soybean, or sunflower oil. Meal planning  Eat a balanced diet that includes: 4 or more servings of fruits and 4 or more servings of vegetables each day. Try to fill one-half of your plate with fruits and vegetables. 6-8 servings of whole grains each day. Less than 6 oz (170 g) of lean meat, poultry, or fish each day. A 3-oz (85-g) serving  of meat is about the same size as a deck of cards. One egg equals 1 oz (28 g). 2-3 servings of low-fat dairy each day. One serving is 1 cup (237 mL). 1 serving of nuts, seeds, or beans 5 times each week. 2-3 servings of heart-healthy fats. Healthy fats called omega-3 fatty acids are found in foods such as walnuts, flaxseeds, fortified milks, and eggs. These fats are also found in cold-water fish, such as sardines, salmon, and mackerel. Limit how much you eat of: Canned or prepackaged foods. Food that is high in trans fat, such as some fried foods. Food that is high in saturated fat, such as fatty meat. Desserts and other sweets, sugary drinks, and other foods with added sugar. Full-fat dairy products. Do not salt foods before eating. Do not eat more than 4 egg yolks a week. Try to eat at least 2 vegetarian meals a week. Eat more home-cooked food and less restaurant, buffet, and fast food. Lifestyle When eating at a restaurant, ask that your food be prepared with less salt or no salt, if possible. If you drink alcohol: Limit how much you use to: 0-1 drink a day for women who are not pregnant. 0-2 drinks a day for men. Be aware of how much alcohol is in your drink. In the [place] one drink equals one 12 oz bottle of beer (355 mL), one 5 oz glass of wine (148 mL), or one 1 oz glass of hard liquor (44 mL). General information Avoid eating more than 2,300 mg of salt a day. If you have hypertension, you may need to reduce your sodium intake to 1,500 mg a day. Work with your health care provider to maintain a healthy body weight or to lose weight. Ask what an ideal weight is for you. Get at least 30 minutes of exercise that causes your heart to beat faster (aerobic exercise) most days of the week. Activities may include walking, swimming, or biking. Work with your health care provider or dietitian to adjust your eating plan to your individual calorie needs. What foods should I eat? Fruits All  fresh, dried, or frozen fruit. Canned fruit in natural juice (without added sugar). Vegetables Fresh or frozen vegetables (raw, steamed, roasted, or grilled). Low-sodium or reduced-sodium tomato and vegetable juice. Low-sodium or reduced-sodium tomato sauce and tomato paste. Low-sodium or reduced-sodium canned vegetables. Grains Whole-grain or whole-wheat bread. Whole-grain or whole-wheat pasta. Brown rice. [place]. Bulgur. Whole-grain and low-sodium cereals. Pita bread. Low-fat, low-sodium crackers. Whole-wheat flour tortillas. Meats and other proteins Skinless chicken or [place]. Ground chicken or [place]. Pork with fat trimmed off. Fish and seafood. Egg whites. Dried beans, peas, or lentils. Unsalted nuts, nut butters, and seeds. Unsalted canned beans. Lean cuts of beef with fat trimmed off. Low-sodium, lean precooked or cured meat, such as sausages or meat loaves. Dairy Low-fat (1%) or fat-free (skim) milk. Reduced-fat, low-fat, or fat-free cheeses. Nonfat, low-sodium ricotta or cottage cheese. Low-fat or nonfat yogurt. Low-fat, low-sodium cheese. Fats and oils Soft margarine without trans fats. Vegetable oil. Reduced-fat, low-fat, or  light mayonnaise and salad dressings (reduced-sodium). Canola, safflower, olive, avocado, soybean, and sunflower oils. Avocado. Seasonings and condiments Herbs. Spices. Seasoning mixes without salt. Other foods Unsalted popcorn and pretzels. Fat-free sweets. The items listed above may not be a complete list of foods and beverages you can eat. Contact a dietitian for more information. What foods should I avoid? Fruits Canned fruit in a light or heavy syrup. Fried fruit. Fruit in cream or butter sauce. Vegetables Creamed or fried vegetables. Vegetables in a cheese sauce. Regular canned vegetables (not low-sodium or reduced-sodium). Regular canned tomato sauce and paste (not low-sodium or reduced-sodium). Regular tomato and vegetable juice (not low-sodium or  reduced-sodium). Angie Fava. Olives. Grains Baked goods made with fat, such as croissants, muffins, or some breads. Dry pasta or rice meal packs. Meats and other proteins Fatty cuts of meat. Ribs. Fried meat. Berniece Salines. Bologna, salami, and other precooked or cured meats, such as sausages or meat loaves. Fat from the back of a pig (fatback). Bratwurst. Salted nuts and seeds. Canned beans with added salt. Canned or smoked fish. Whole eggs or egg yolks. Chicken or [place] with skin. Dairy Whole or 2% milk, cream, and half-and-half. Whole or full-fat cream cheese. Whole-fat or sweetened yogurt. Full-fat cheese. Nondairy creamers. Whipped toppings. Processed cheese and cheese spreads. Fats and oils Butter. Stick margarine. Lard. Shortening. Ghee. Bacon fat. Tropical oils, such as coconut, palm kernel, or palm oil. Seasonings and condiments Onion salt, garlic salt, seasoned salt, table salt, and sea salt. Worcestershire sauce. Tartar sauce. Barbecue sauce. Teriyaki sauce. Soy sauce, including reduced-sodium. Steak sauce. Canned and packaged gravies. Fish sauce. Oyster sauce. Cocktail sauce. Store-bought horseradish. Ketchup. Mustard. Meat flavorings and tenderizers. Bouillon cubes. Hot sauces. Pre-made or packaged marinades. Pre-made or packaged taco seasonings. Relishes. Regular salad dressings. Other foods Salted popcorn and pretzels. The items listed above may not be a complete list of foods and beverages you should avoid. Contact a dietitian for more information. Where to find more information National Heart, Lung, and Blood Institute: https://wilson-eaton.com/ American Heart Association: www.heart.org Academy of Nutrition and Dietetics: www.eatright.[place]: www.kidney.org Summary The DASH eating plan is a healthy eating plan that has been shown to reduce high blood pressure (hypertension). It may also reduce your risk for type 2 diabetes, heart disease, and stroke. When on the DASH  eating plan, aim to eat more fresh fruits and vegetables, whole grains, lean proteins, low-fat dairy, and heart-healthy fats. With the DASH eating plan, you should limit salt (sodium) intake to 2,300 mg a day. If you have hypertension, you may need to reduce your sodium intake to 1,500 mg a day. Work with your health care provider or dietitian to adjust your eating plan to your individual calorie needs. This information is not intended to replace advice given to you by your health care provider. Make sure you discuss any questions you have with your health care provider. Document Revised: 03/07/2019 Document Reviewed: 03/07/2019 Elsevier Patient Education  [place]

## 2022-04-04 ENCOUNTER — Other Ambulatory Visit: Payer: Self-pay | Admitting: Nurse Practitioner

## 2022-04-04 ENCOUNTER — Other Ambulatory Visit (HOSPITAL_BASED_OUTPATIENT_CLINIC_OR_DEPARTMENT_OTHER): Payer: Self-pay | Admitting: Nurse Practitioner

## 2022-04-04 DIAGNOSIS — I25118 Atherosclerotic heart disease of native coronary artery with other forms of angina pectoris: Secondary | ICD-10-CM

## 2022-04-04 DIAGNOSIS — E1142 Type 2 diabetes mellitus with diabetic polyneuropathy: Secondary | ICD-10-CM

## 2022-04-04 DIAGNOSIS — J449 Chronic obstructive pulmonary disease, unspecified: Secondary | ICD-10-CM

## 2022-04-04 NOTE — Telephone Encounter (Signed)
Multiple inhalers on his list including Anoro, Bevespi and Stiolto; all of which have similar medications and should not be used together. Can we find out which he is using?

## 2022-04-05 ENCOUNTER — Other Ambulatory Visit: Payer: Self-pay

## 2022-04-13 DIAGNOSIS — W19XXXA Unspecified fall, initial encounter: Secondary | ICD-10-CM | POA: Insufficient documentation

## 2022-04-13 DIAGNOSIS — R531 Weakness: Secondary | ICD-10-CM | POA: Insufficient documentation

## 2022-04-13 LAB — POCT URINALYSIS DIP (CLINITEK)
Bilirubin, UA: NEGATIVE
Blood, UA: NEGATIVE
Glucose, UA: NEGATIVE mg/dL
Ketones, POC UA: NEGATIVE mg/dL
Leukocytes, UA: NEGATIVE
Nitrite, UA: NEGATIVE
POC PROTEIN,UA: NEGATIVE
Spec Grav, UA: >=1.03 — AB (ref 1.010–1.025)
Urobilinogen, UA: NEGATIVE E.U./dL — AB
pH, UA: 5.5 (ref 5.0–8.0)

## 2022-04-16 DIAGNOSIS — D509 Iron deficiency anemia, unspecified: Secondary | ICD-10-CM | POA: Insufficient documentation

## 2022-04-16 NOTE — Progress Notes (Signed)
  Orma Render, DNP, AGNP-c [place] [address] [place], [place] [address] [phone]  Subjective:   Paul [name] is a 71 y.o. male presents to day for evaluation of: Fatigue and iron deficiency Josmar was last seen about 2 weeks ago for fatigue and found to be significantly iron deficient at that time. He has not yet seen hematology, but is in agreement to be seen and has appointment set up. He is still feeling very fatigued, short of breath, weak, and lethargic. He has been taking oral iron. He denies any signs of bleeding, no dark stools, no dark or red urine, no bruising.   PMH, Medications, and Allergies reviewed and updated in chart as appropriate.   ROS negative except for what is listed in HPI. Objective:  BP (!) 141/69 (BP Location: Left Arm, Patient Position: Sitting)   Pulse 67   Ht '5\' 5"'$  (1.651 m)   Wt 185 lb (83.9 kg)   SpO2 93%   BMI 30.79 kg/m  Physical Exam Vitals and nursing note reviewed.  Constitutional:      Appearance: He is ill-appearing.  HENT:     Head: Normocephalic.  Neck:     Vascular: No carotid bruit.  Cardiovascular:     Rate and Rhythm: Normal rate and regular rhythm.     Pulses: Normal pulses.     Heart sounds: Normal heart sounds.  Pulmonary:     Breath sounds: Wheezing present.  Abdominal:     General: Bowel sounds are normal. There is no distension.     Palpations: Abdomen is soft.     Tenderness: There is no abdominal tenderness. There is no guarding.  Skin:    General: Skin is warm and dry.     Capillary Refill: Capillary refill takes less than 2 seconds.     Coloration: Skin is pale.  Neurological:     General: No focal deficit present.     Mental Status: He is alert.     Motor: Weakness present.  Psychiatric:        Mood and Affect: Mood normal.        Behavior: Behavior normal.        Thought Content: Thought content normal.        Judgment: Judgment normal.        Assessment & Plan:   Problem  List Items Addressed This Visit     Iron deficiency anemia - Primary    Recent finding of significant symptomatic IDA. He is scheduled with hematology for evaluation this week. He is not worsening at this time, but I am concerned about his current state. We have reviewed symptoms that would warrant immediate evaluation in the emergency room and the importance of maintaining a healthy diet and staying well hydrated. We will continue to monitor closely. His brother will check on him daily.       Relevant Orders   CBC with Differential/Platelet (Completed)   Iron, TIBC and Ferritin Panel (Completed)   Other Visit Diagnoses     Fatigue, unspecified type       Relevant Orders   CBC with Differential/Platelet (Completed)   Iron, TIBC and Ferritin Panel (Completed)         Orma Render, DNP, AGNP-c 04/16/2022  9:26 PM    History, Medications, Surgery, SDOH, and Family History reviewed and updated as appropriate.

## 2022-04-16 NOTE — Assessment & Plan Note (Signed)
Recent finding of significant symptomatic IDA. He is scheduled with hematology for evaluation this week. He is not worsening at this time, but I am concerned about his current state. We have reviewed symptoms that would warrant immediate evaluation in the emergency room and the importance of maintaining a healthy diet and staying well hydrated. We will continue to monitor closely. His brother will check on him daily.

## 2022-04-18 ENCOUNTER — Other Ambulatory Visit (HOSPITAL_COMMUNITY): Payer: Self-pay

## 2022-04-18 MED ORDER — HYDROCODONE-ACETAMINOPHEN 5-325 MG PO TABS
1.0000 | ORAL_TABLET | Freq: Three times a day (TID) | ORAL | 0 refills | Status: DC
  Filled 2022-04-18: qty 90, 30d supply, fill #0

## 2022-04-19 ENCOUNTER — Other Ambulatory Visit: Payer: Self-pay | Admitting: *Deleted

## 2022-04-19 MED ORDER — BEVESPI AEROSPHERE 9-4.8 MCG/ACT IN AERO: 2.0000 | INHALATION_SPRAY | Freq: Two times a day (BID) | RESPIRATORY_TRACT | 3 refills | Status: DC

## 2022-04-20 ENCOUNTER — Encounter: Payer: Self-pay | Admitting: Nurse Practitioner

## 2022-04-20 ENCOUNTER — Ambulatory Visit (INDEPENDENT_AMBULATORY_CARE_PROVIDER_SITE_OTHER): Payer: Medicare Other | Admitting: Nurse Practitioner

## 2022-04-20 VITALS — BP 130/76 | HR 78 | Temp 98.1°F | Ht 65.0 in | Wt 187.4 lb

## 2022-04-20 DIAGNOSIS — E611 Iron deficiency: Secondary | ICD-10-CM | POA: Diagnosis not present

## 2022-04-20 DIAGNOSIS — G4733 Obstructive sleep apnea (adult) (pediatric): Secondary | ICD-10-CM

## 2022-04-20 DIAGNOSIS — E1122 Type 2 diabetes mellitus with diabetic chronic kidney disease: Secondary | ICD-10-CM | POA: Diagnosis not present

## 2022-04-20 DIAGNOSIS — E785 Hyperlipidemia, unspecified: Secondary | ICD-10-CM

## 2022-04-20 DIAGNOSIS — E1142 Type 2 diabetes mellitus with diabetic polyneuropathy: Secondary | ICD-10-CM

## 2022-04-20 DIAGNOSIS — R718 Other abnormality of red blood cells: Secondary | ICD-10-CM | POA: Diagnosis not present

## 2022-04-20 DIAGNOSIS — I25118 Atherosclerotic heart disease of native coronary artery with other forms of angina pectoris: Secondary | ICD-10-CM

## 2022-04-20 DIAGNOSIS — I2510 Atherosclerotic heart disease of native coronary artery without angina pectoris: Secondary | ICD-10-CM | POA: Diagnosis not present

## 2022-04-20 DIAGNOSIS — R109 Unspecified abdominal pain: Secondary | ICD-10-CM | POA: Diagnosis not present

## 2022-04-20 DIAGNOSIS — I509 Heart failure, unspecified: Secondary | ICD-10-CM | POA: Diagnosis not present

## 2022-04-20 DIAGNOSIS — J449 Chronic obstructive pulmonary disease, unspecified: Secondary | ICD-10-CM | POA: Diagnosis not present

## 2022-04-20 DIAGNOSIS — M1A09X0 Idiopathic chronic gout, multiple sites, without tophus (tophi): Secondary | ICD-10-CM

## 2022-04-20 DIAGNOSIS — I739 Peripheral vascular disease, unspecified: Secondary | ICD-10-CM

## 2022-04-20 DIAGNOSIS — F5104 Psychophysiologic insomnia: Secondary | ICD-10-CM

## 2022-04-20 DIAGNOSIS — D509 Iron deficiency anemia, unspecified: Secondary | ICD-10-CM

## 2022-04-20 DIAGNOSIS — G8929 Other chronic pain: Secondary | ICD-10-CM

## 2022-04-20 DIAGNOSIS — E1140 Type 2 diabetes mellitus with diabetic neuropathy, unspecified: Secondary | ICD-10-CM

## 2022-04-20 DIAGNOSIS — E1165 Type 2 diabetes mellitus with hyperglycemia: Secondary | ICD-10-CM

## 2022-04-20 DIAGNOSIS — E1159 Type 2 diabetes mellitus with other circulatory complications: Secondary | ICD-10-CM

## 2022-04-20 DIAGNOSIS — R7309 Other abnormal glucose: Secondary | ICD-10-CM | POA: Diagnosis not present

## 2022-04-20 DIAGNOSIS — N1831 Chronic kidney disease, stage 3a: Secondary | ICD-10-CM | POA: Diagnosis not present

## 2022-04-20 DIAGNOSIS — K219 Gastro-esophageal reflux disease without esophagitis: Secondary | ICD-10-CM | POA: Diagnosis not present

## 2022-04-20 DIAGNOSIS — K5900 Constipation, unspecified: Secondary | ICD-10-CM | POA: Diagnosis not present

## 2022-04-20 DIAGNOSIS — Z794 Long term (current) use of insulin: Secondary | ICD-10-CM

## 2022-04-20 DIAGNOSIS — R1901 Right upper quadrant abdominal swelling, mass and lump: Secondary | ICD-10-CM | POA: Diagnosis not present

## 2022-04-20 DIAGNOSIS — I152 Hypertension secondary to endocrine disorders: Secondary | ICD-10-CM

## 2022-04-20 MED ORDER — METFORMIN HCL ER 500 MG PO TB24: 1000.0000 mg | ORAL_TABLET | Freq: Two times a day (BID) | ORAL | 3 refills | Status: DC

## 2022-04-20 MED ORDER — ANORO ELLIPTA 62.5-25 MCG/ACT IN AEPB: 1.0000 | INHALATION_SPRAY | Freq: Every day | RESPIRATORY_TRACT | 3 refills | Status: DC

## 2022-04-20 MED ORDER — GABAPENTIN 300 MG PO CAPS: 600.0000 mg | ORAL_CAPSULE | Freq: Three times a day (TID) | ORAL | 3 refills | Status: DC

## 2022-04-20 MED ORDER — ZOLPIDEM TARTRATE 10 MG PO TABS: 10.0000 mg | ORAL_TABLET | Freq: Every evening | ORAL | 3 refills | Status: DC | PRN

## 2022-04-20 MED ORDER — PANTOPRAZOLE SODIUM 40 MG PO TBEC: 40.0000 mg | DELAYED_RELEASE_TABLET | Freq: Every day | ORAL | 3 refills | Status: DC

## 2022-04-20 MED ORDER — ALLOPURINOL 100 MG PO TABS: 100.0000 mg | ORAL_TABLET | Freq: Every day | ORAL | 3 refills | Status: DC

## 2022-04-20 MED ORDER — METOPROLOL TARTRATE 25 MG PO TABS: 25.0000 mg | ORAL_TABLET | Freq: Two times a day (BID) | ORAL | 3 refills | Status: DC

## 2022-04-20 NOTE — Patient Instructions (Addendum)
I have sent a years worth of refills for you to the [facility].   [facility] Phone: [phone] Laretta Bolster Fax: [phone] [facility] Email: [email or web address]  My CMA is Adam, please don't hesitate to call us if you need anything and ask to speak to [place].    I am going to go through all of the medication you get today after clinic and make sure [facility]. If not, I will resend this (even if other doctors send it initially). The only one I wont send to mail order is Zolpidem and Pain Medication

## 2022-04-20 NOTE — Progress Notes (Signed)
Worthy Keeler, DNP, AGNP-c [place]  [address] [place], [place] [address] [phone]  ESTABLISHED PATIENT- Chronic Health and/or Follow-Up Visit  Blood pressure 130/76, pulse 78, temperature 98.1 F (36.7 C), height '5\' 5"'$  (1.651 m), weight 187 lb 6.4 oz (85 kg).    Paul [name] is a 72 y.o. year old male presenting today for evaluation and management of chronic medical conditions.   Iron Deficiency Paul [name] tells me that he is still working with hematology to determine the cause of his anemia. He reports that the fatigue is still very persistent and strong. He has been taking his iron supplementation, but is not feeling any improvement in his fatigue. A colonoscopy and endoscopy are scheduled in the near future. He denies any concerns with bleeding or dark stool or urine.  Diabetes He reports that his insurance is changing coverage of his insulin. He is in need of the new medications sent in. He has been intermittently checking his BG and reports it has been fairly well controlled. He keeps these numbers logged into his phone. He has the help of his step-sons significant other on his medication management. I did speak with her during the visit via telephone to help determine the medication changes that were needed.  His levemir needs changed to tresiba, semglee, or toujeo. He is currently taking 40 units.  He endorses symptoms of fatigue and weakness, but correlates this with low iron levels. He has not seen an hypoglycemic episodes recently.   Insomnia He is in need of refills on zolpidem today. He reports using this for sleep and finding it helpful for management of chronic insomnia. He has been using this medication for many years.   Neuropathy Paul [name] tells me his neuropathy has been bothering him quite a bit over the last several months. He reports his lower legs and feet are painful and burning with no relief. He has not been taking the gabapentin at the  increased dosage recently prescribed as he tells me that he did not receive the increased dose from the pharmacy.  He tells me that he is having difficulty [facility] and has been told that they are waiting for authorization for me. Review of the chart shows that refills were sent on medications in October and December. Records show that the pharmacy has not distributed the new dosages to the patient.     He gets pain medication and zolpidem come from [facility]     All ROS negative with exception of what is listed above.   PHYSICAL EXAM Physical Exam Nursing note reviewed.  Constitutional:      Appearance: Normal appearance.  HENT:     Head: Normocephalic.  Eyes:     Pupils: Pupils are equal, round, and reactive to light.  Neck:     Vascular: No carotid bruit.  Cardiovascular:     Rate and Rhythm: Normal rate and regular rhythm.     Pulses: Normal pulses.     Heart sounds: Normal heart sounds.  Pulmonary:     Breath sounds: Wheezing present.  Abdominal:     General: Bowel sounds are normal. There is no distension.     Palpations: Abdomen is soft.     Tenderness: There is no abdominal tenderness.  Musculoskeletal:     Cervical back: Normal range of motion.     Right lower leg: No edema.     Left lower leg: No edema.  Lymphadenopathy:  Cervical: No cervical adenopathy.  Skin:    General: Skin is warm and dry.     Capillary Refill: Capillary refill takes less than 2 seconds.  Neurological:     General: No focal deficit present.     Mental Status: He is alert.     Motor: Weakness present.  Psychiatric:        Mood and Affect: Mood normal.        Behavior: Behavior normal.        Thought Content: Thought content normal.        Judgment: Judgment normal.    PLAN Problem List Items Addressed This Visit     Hyperlipidemia with target LDL less than 70 (Chronic)   Relevant Medications   metoprolol tartrate  (LOPRESSOR) 25 MG tablet   OSA and COPD overlap syndrome (HCC)    Chronic. There is wheezing present bilaterally throughout the lungs. He has been without his inhalers due to insurance issues. It is unclear why he is not getting his refills as these have been sent in the recent months when requested. Refills have been sent today and [facility] to ensure that they received the request. They have informed us that they will send the medications as they are due. Encouraged him to monitor his breathing closely and use extreme caution when in the public as the rate of flu, rsv, and covid is extremely high. I am concerned that this would be detrimental to his overall health.       Relevant Medications   umeclidinium-vilanterol (ANORO ELLIPTA) 62.5-25 MCG/ACT AEPB   Gastroesophageal reflux disease    No alarm symptoms present today. He is scheduled to see GI in the near future for upper endoscopy and colonoscopy, which I am pleased to hear given his anemia. Will refill his pantoprazole today. No signs of bleeding. Continue to monitor diet and report any new or worsening symptoms immediately.       Relevant Medications   pantoprazole (PROTONIX) 40 MG tablet   Diabetes mellitus with stage 3 chronic kidney disease, with long-term current use of insulin (HCC)    Chronic. He is doing better with his blood sugars with daily humalog and levemir. Insurance changes are requiring transition to a new long acting insulin. Will plan to transition to toujeo (insulin glargine) once daily at 40 units. Recommend close blood sugar monitoring during transition phase and increase dosage no more than every 4 days by 2 units for fasting blood sugars greater than 150. Recommend decrease insulin by 2 units for fasting blood sugars less than 100. Will monitor closely. 1 year supply [facility].       Relevant Medications   metFORMIN (GLUCOPHAGE-XR) 500 MG 24 hr tablet   insulin glargine, 1 Unit Dial,  (TOUJEO SOLOSTAR) 300 UNIT/ML Solostar Pen   CAD (coronary artery disease), native coronary artery    Chronic CAD. Recommendations for diet and exercise provided. At this time he is not exhibiting any signs of worsening disease. Labs are up to date. No signs of bleeding at this time, therefore recommend continue current dose of low dose ASA. If any signs of bleeding present, recommend he stop this immediately. Will closely monitor platelets and hemoglobin with hematology.       Relevant Medications   metoprolol tartrate (LOPRESSOR) 25 MG tablet   Chronic kidney disease, stage 3a (HCC)    Chronic. Discussion today with importance of maintaining hydration and avoiding medications that are hard on the kidneys.  Continue with blood sugar control and monitoring diet and exercise to help reduce the risks of worsening kidney function. Labs are up to date at this time. Will continue to monitor closely.       Hypertension associated with diabetes ([place]) - Primary    Chronic. His blood pressures are fairly well controlled. Given his frailty, I prefer to have his BP goal set less than 140/90 to help reduce the risk of hypotensive episodes. No alarm symptoms are present at this time. Refills provided on medication today. His labs are up to date. Continue diet and exercise monitoring with low carb and low fat food options.       Relevant Medications   metoprolol tartrate (LOPRESSOR) 25 MG tablet   metFORMIN (GLUCOPHAGE-XR) 500 MG 24 hr tablet   insulin glargine, 1 Unit Dial, (TOUJEO SOLOSTAR) 300 UNIT/ML Solostar Pen   Peripheral vascular disease (HCC)    Paresthesias are chronically present, but no edema at this time. I suspect is paresthesias are more related to diabetic neuropathy as opposed to worsening vascular disorder. Recommend continue to monitor for color and temperature changes and wounds on the lower extremities and report these immediately.      Relevant Medications   metoprolol tartrate  (LOPRESSOR) 25 MG tablet   Gout    Chronic. Refills provided on medication. [facility] to ensure they have received the prescription and this was confirmed. No symptoms at this time. Continue to monitor closely.       Relevant Medications   allopurinol (ZYLOPRIM) 100 MG tablet   Diabetic polyneuropathy associated with type 2 diabetes mellitus (HCC)    Chronic. Neuropathic pain not well controlled at this time as he has not had the appropriate dose of gabapentin for management. The order was changes over a month ago with a new prescription sent to the pharmacy, however, this was not sent to him for unknown reasons. I have resent the prescription today and verified with the pharmacy that the prescription was received. Will monitor closely. Recommend continue close monitoring of diet and exercise and blood sugar to reduce worsening of symptoms.       Relevant Medications   gabapentin (NEURONTIN) 300 MG capsule   metFORMIN (GLUCOPHAGE-XR) 500 MG 24 hr tablet   zolpidem (AMBIEN) 10 MG tablet   insulin glargine, 1 Unit Dial, (TOUJEO SOLOSTAR) 300 UNIT/ML Solostar Pen   Psychophysiological insomnia    Chronic. Well controlled with ambien. He has been on this medication for several years and is tolerating well without side effects. Given the long term use, we will continue with the medication with close monitoring.       Relevant Medications   zolpidem (AMBIEN) 10 MG tablet   Chronic heart failure (HCC)    Weakness, fatigue, and shortness of breath present at recent baseline. No indication of worsening heart failure as the reasoning for his symptoms. I do suspect that his iron deficiency is the causative factor. Lungs wheezy bilaterally, but no crackles or signs of fluid volume overload. Continue current regimen and will monitor closely.       Relevant Medications   metoprolol tartrate (LOPRESSOR) 25 MG tablet   Iron deficiency anemia   Other Visit Diagnoses     Type 2 diabetes  mellitus with diabetic neuropathy, with long-term current use of insulin (HCC)       Relevant Medications   gabapentin (NEURONTIN) 300 MG capsule   metFORMIN (GLUCOPHAGE-XR) 500 MG 24 hr tablet   insulin glargine, 1 Unit  Dial, (TOUJEO SOLOSTAR) 300 UNIT/ML Solostar Pen       Return in about 3 months (around 07/20/2022).  Worthy Keeler, DNP, AGNP-c 04/20/2022  1:23 PM

## 2022-04-21 ENCOUNTER — Other Ambulatory Visit: Payer: Self-pay | Admitting: Physician Assistant

## 2022-04-21 DIAGNOSIS — R109 Unspecified abdominal pain: Secondary | ICD-10-CM

## 2022-04-21 DIAGNOSIS — D509 Iron deficiency anemia, unspecified: Secondary | ICD-10-CM

## 2022-04-23 ENCOUNTER — Encounter: Payer: Self-pay | Admitting: Nurse Practitioner

## 2022-04-23 MED ORDER — TOUJEO SOLOSTAR 300 UNIT/ML ~~LOC~~ SOPN: 40.0000 [IU] | PEN_INJECTOR | Freq: Every day | SUBCUTANEOUS | 3 refills | Status: DC

## 2022-04-23 NOTE — Assessment & Plan Note (Signed)
Chronic. Refills provided on medication. [facility] to ensure they have received the prescription and this was confirmed. No symptoms at this time. Continue to monitor closely.

## 2022-04-23 NOTE — Assessment & Plan Note (Addendum)
Chronic. Neuropathic pain not well controlled at this time as he has not had the appropriate dose of gabapentin for management. The order was changes over a month ago with a new prescription sent to the pharmacy, however, this was not sent to him for unknown reasons. I have resent the prescription today and verified with the pharmacy that the prescription was received. Will monitor closely. Recommend continue close monitoring of diet and exercise and blood sugar to reduce worsening of symptoms.

## 2022-04-23 NOTE — Assessment & Plan Note (Signed)
Paresthesias are chronically present, but no edema at this time. I suspect is paresthesias are more related to diabetic neuropathy as opposed to worsening vascular disorder. Recommend continue to monitor for color and temperature changes and wounds on the lower extremities and report these immediately.

## 2022-04-23 NOTE — Assessment & Plan Note (Signed)
Chronic. He is doing better with his blood sugars with daily humalog and levemir. Insurance changes are requiring transition to a new long acting insulin. Will plan to transition to toujeo (insulin glargine) once daily at 40 units. Recommend close blood sugar monitoring during transition phase and increase dosage no more than every 4 days by 2 units for fasting blood sugars greater than 150. Recommend decrease insulin by 2 units for fasting blood sugars less than 100. Will monitor closely. 1 year supply [facility].

## 2022-04-23 NOTE — Assessment & Plan Note (Signed)
Chronic. His blood pressures are fairly well controlled. Given his frailty, I prefer to have his BP goal set less than 140/90 to help reduce the risk of hypotensive episodes. No alarm symptoms are present at this time. Refills provided on medication today. His labs are up to date. Continue diet and exercise monitoring with low carb and low fat food options.

## 2022-04-23 NOTE — Assessment & Plan Note (Signed)
Chronic. Well controlled with ambien. He has been on this medication for several years and is tolerating well without side effects. Given the long term use, we will continue with the medication with close monitoring.

## 2022-04-23 NOTE — Assessment & Plan Note (Signed)
Weakness, fatigue, and shortness of breath present at recent baseline. No indication of worsening heart failure as the reasoning for his symptoms. I do suspect that his iron deficiency is the causative factor. Lungs wheezy bilaterally, but no crackles or signs of fluid volume overload. Continue current regimen and will monitor closely.

## 2022-04-23 NOTE — Assessment & Plan Note (Signed)
Chronic. There is wheezing present bilaterally throughout the lungs. He has been without his inhalers due to insurance issues. It is unclear why he is not getting his refills as these have been sent in the recent months when requested. Refills have been sent today and [facility] to ensure that they received the request. They have informed us that they will send the medications as they are due. Encouraged him to monitor his breathing closely and use extreme caution when in the public as the rate of flu, rsv, and covid is extremely high. I am concerned that this would be detrimental to his overall health.

## 2022-04-23 NOTE — Assessment & Plan Note (Signed)
No alarm symptoms present today. He is scheduled to see GI in the near future for upper endoscopy and colonoscopy, which I am pleased to hear given his anemia. Will refill his pantoprazole today. No signs of bleeding. Continue to monitor diet and report any new or worsening symptoms immediately.

## 2022-04-23 NOTE — Assessment & Plan Note (Signed)
Chronic. Discussion today with importance of maintaining hydration and avoiding medications that are hard on the kidneys. Continue with blood sugar control and monitoring diet and exercise to help reduce the risks of worsening kidney function. Labs are up to date at this time. Will continue to monitor closely.

## 2022-04-23 NOTE — Assessment & Plan Note (Signed)
Chronic CAD. Recommendations for diet and exercise provided. At this time he is not exhibiting any signs of worsening disease. Labs are up to date. No signs of bleeding at this time, therefore recommend continue current dose of low dose ASA. If any signs of bleeding present, recommend he stop this immediately. Will closely monitor platelets and hemoglobin with hematology.

## 2022-04-25 ENCOUNTER — Telehealth: Payer: Self-pay | Admitting: *Deleted

## 2022-04-25 ENCOUNTER — Other Ambulatory Visit: Payer: Medicare Other

## 2022-04-25 ENCOUNTER — Ambulatory Visit
Admission: RE | Admit: 2022-04-25 | Discharge: 2022-04-25 | Disposition: A | Discharge status: Home / Self Care | Payer: Medicare Other | Source: Ambulatory Visit | Attending: Physician Assistant | Admitting: Physician Assistant

## 2022-04-25 DIAGNOSIS — R161 Splenomegaly, not elsewhere classified: Secondary | ICD-10-CM | POA: Diagnosis not present

## 2022-04-25 DIAGNOSIS — D509 Iron deficiency anemia, unspecified: Secondary | ICD-10-CM

## 2022-04-25 DIAGNOSIS — R0609 Other forms of dyspnea: Secondary | ICD-10-CM

## 2022-04-25 DIAGNOSIS — K7469 Other cirrhosis of liver: Secondary | ICD-10-CM | POA: Diagnosis not present

## 2022-04-25 DIAGNOSIS — K5730 Diverticulosis of large intestine without perforation or abscess without bleeding: Secondary | ICD-10-CM | POA: Diagnosis not present

## 2022-04-25 DIAGNOSIS — R079 Chest pain, unspecified: Secondary | ICD-10-CM

## 2022-04-25 DIAGNOSIS — Z951 Presence of aortocoronary bypass graft: Secondary | ICD-10-CM

## 2022-04-25 DIAGNOSIS — R109 Unspecified abdominal pain: Secondary | ICD-10-CM

## 2022-04-25 DIAGNOSIS — K766 Portal hypertension: Secondary | ICD-10-CM | POA: Diagnosis not present

## 2022-04-25 MED ORDER — IOPAMIDOL (ISOVUE-370) INJECTION 76%
80.0000 mL | Freq: Once | INTRAVENOUS | Status: AC | PRN
  Administered 2022-04-25: 80 mL via INTRAVENOUS

## 2022-04-25 NOTE — Telephone Encounter (Signed)
He was having symptoms at the last office visit.  I have recommended echocardiogram and if unremarkable and/or if he continues to have symptoms possible ischemic evaluation.  Echo is scheduled for 1/18.

## 2022-04-25 NOTE — Telephone Encounter (Signed)
   Pre-operative Risk Assessment    Patient Name: Paul [name]  DOB: 21-Feb-1951 MRN: [id number]    Request for Surgical Clearance    Procedure:   COLONOSCOPY  Date of Surgery:  Clearance TBD                       Surgeon:  Deliah Goody PA-C  Surgeon's Group or Practice Name:  [facility]  Phone number:  [phone]  Fax number:  [phone]   Type of Clearance Requested:   - Medical    Type of Anesthesia:  General    SignedClaude Manges   04/25/2022, 3:21 PM

## 2022-05-02 ENCOUNTER — Other Ambulatory Visit: Payer: Medicare Other

## 2022-05-02 DIAGNOSIS — M5416 Radiculopathy, lumbar region: Secondary | ICD-10-CM | POA: Diagnosis not present

## 2022-05-04 ENCOUNTER — Ambulatory Visit: Payer: Medicare Other

## 2022-05-04 ENCOUNTER — Ambulatory Visit (HOSPITAL_COMMUNITY): Payer: Medicare Other | Attending: Internal Medicine

## 2022-05-04 DIAGNOSIS — I25118 Atherosclerotic heart disease of native coronary artery with other forms of angina pectoris: Secondary | ICD-10-CM

## 2022-05-04 DIAGNOSIS — R0609 Other forms of dyspnea: Secondary | ICD-10-CM | POA: Diagnosis not present

## 2022-05-04 DIAGNOSIS — E785 Hyperlipidemia, unspecified: Secondary | ICD-10-CM

## 2022-05-04 DIAGNOSIS — Z951 Presence of aortocoronary bypass graft: Secondary | ICD-10-CM

## 2022-05-04 LAB — BASIC METABOLIC PANEL
BUN/Creatinine Ratio: 8 — ABNORMAL LOW (ref 10–24)
BUN: 8 mg/dL (ref 8–27)
CO2: 19 mmol/L — ABNORMAL LOW (ref 20–29)
Calcium: 9.6 mg/dL (ref 8.6–10.2)
Chloride: 102 mmol/L (ref 96–106)
Creatinine, Ser: 0.95 mg/dL (ref 0.76–1.27)
Glucose: 246 mg/dL — ABNORMAL HIGH (ref 70–99)
Potassium: 4.4 mmol/L (ref 3.5–5.2)
Sodium: 139 mmol/L (ref 134–144)
eGFR: 85 mL/min/{1.73_m2} (ref >59)

## 2022-05-04 LAB — ECHOCARDIOGRAM COMPLETE
Area-P 1/2: 4.24 cm2
S' Lateral: 2.3 cm

## 2022-05-10 ENCOUNTER — Other Ambulatory Visit: Payer: Self-pay | Admitting: Nurse Practitioner

## 2022-05-10 ENCOUNTER — Telehealth (HOSPITAL_COMMUNITY): Payer: Self-pay | Admitting: *Deleted

## 2022-05-10 DIAGNOSIS — M5442 Lumbago with sciatica, left side: Secondary | ICD-10-CM | POA: Diagnosis not present

## 2022-05-10 DIAGNOSIS — R0609 Other forms of dyspnea: Secondary | ICD-10-CM

## 2022-05-10 DIAGNOSIS — Z951 Presence of aortocoronary bypass graft: Secondary | ICD-10-CM

## 2022-05-10 DIAGNOSIS — I25118 Atherosclerotic heart disease of native coronary artery with other forms of angina pectoris: Secondary | ICD-10-CM

## 2022-05-10 DIAGNOSIS — M9903 Segmental and somatic dysfunction of lumbar region: Secondary | ICD-10-CM | POA: Diagnosis not present

## 2022-05-10 NOTE — Telephone Encounter (Signed)
I will update the requesting office the pt is going to need cardiac testing before he can be cleared. Pt has stress test scheduled for 05/12/22.

## 2022-05-10 NOTE — Telephone Encounter (Signed)
Left message on voicemail per DPR in reference to upcoming appointment scheduled on 05/12/2022 at 10:30 with detailed instructions given per Myocardial Perfusion Study Information Sheet for the test. LM to arrive 15 minutes early, and that it is imperative to arrive on time for appointment to keep from having the test rescheduled. If you need to cancel or reschedule your appointment, please call the office within 24 hours of your appointment. Failure to do so may result in a cancellation of your appointment, and a $50 no show fee. Phone number given for call back for any questions.

## 2022-05-10 NOTE — Telephone Encounter (Signed)
Called pt to make him aware that he will need to get a Lexiscan before his appointment on Monday.  He didn't have a pen / paper to write down the instructions and he said he will call back to get the instructions (see below).  Pt was told to ask for the preop team.     You are scheduled for a Myocardial Perfusion Imaging Study  Please arrive 15 minutes prior to your appointment time for registration and insurance purposes.  The test will take approximately 3 to 4 hours to complete; you may bring reading material.  If someone comes with you to your appointment, they will need to remain in the main lobby due to limited space in the testing area. **If you are pregnant or breastfeeding, please notify the nuclear lab prior to your appointment**  How to prepare for your Myocardial Perfusion Test: Do not eat or drink 3 hours prior to your test, except you may have water. Do not consume products containing caffeine (regular or decaffeinated) 12 hours prior to your test. (ex: coffee, chocolate, sodas, tea). Do bring a list of your current medications with you.  If not listed below, you may take your medications as normal. Do wear comfortable clothes (no dresses or overalls) and walking shoes, tennis shoes preferred (No heels or open toe shoes are allowed). Do NOT wear cologne, perfume, aftershave, or lotions (deodorant is allowed). If these instructions are not followed, your test will have to be rescheduled.

## 2022-05-10 NOTE — Telephone Encounter (Signed)
Recommend stress test since he continues to have symptoms. Could we get that prior to his appointment on Monday?

## 2022-05-11 ENCOUNTER — Encounter: Payer: Self-pay | Admitting: Nurse Practitioner

## 2022-05-11 ENCOUNTER — Ambulatory Visit: Payer: Medicare Other | Admitting: Nurse Practitioner

## 2022-05-11 ENCOUNTER — Telehealth (INDEPENDENT_AMBULATORY_CARE_PROVIDER_SITE_OTHER): Payer: Medicare Other | Admitting: Nurse Practitioner

## 2022-05-11 VITALS — Ht 65.0 in | Wt 190.0 lb

## 2022-05-11 DIAGNOSIS — R531 Weakness: Secondary | ICD-10-CM | POA: Diagnosis not present

## 2022-05-11 DIAGNOSIS — I739 Peripheral vascular disease, unspecified: Secondary | ICD-10-CM | POA: Diagnosis not present

## 2022-05-11 DIAGNOSIS — E1122 Type 2 diabetes mellitus with diabetic chronic kidney disease: Secondary | ICD-10-CM

## 2022-05-11 DIAGNOSIS — I25118 Atherosclerotic heart disease of native coronary artery with other forms of angina pectoris: Secondary | ICD-10-CM

## 2022-05-11 DIAGNOSIS — N1831 Chronic kidney disease, stage 3a: Secondary | ICD-10-CM

## 2022-05-11 DIAGNOSIS — Z794 Long term (current) use of insulin: Secondary | ICD-10-CM

## 2022-05-11 DIAGNOSIS — E1159 Type 2 diabetes mellitus with other circulatory complications: Secondary | ICD-10-CM

## 2022-05-11 DIAGNOSIS — E1142 Type 2 diabetes mellitus with diabetic polyneuropathy: Secondary | ICD-10-CM | POA: Diagnosis not present

## 2022-05-11 DIAGNOSIS — J449 Chronic obstructive pulmonary disease, unspecified: Secondary | ICD-10-CM | POA: Diagnosis not present

## 2022-05-11 DIAGNOSIS — M5442 Lumbago with sciatica, left side: Secondary | ICD-10-CM | POA: Diagnosis not present

## 2022-05-11 DIAGNOSIS — E1165 Type 2 diabetes mellitus with hyperglycemia: Secondary | ICD-10-CM

## 2022-05-11 DIAGNOSIS — M9903 Segmental and somatic dysfunction of lumbar region: Secondary | ICD-10-CM | POA: Diagnosis not present

## 2022-05-11 DIAGNOSIS — Z789 Other specified health status: Secondary | ICD-10-CM

## 2022-05-11 DIAGNOSIS — I509 Heart failure, unspecified: Secondary | ICD-10-CM

## 2022-05-11 DIAGNOSIS — D500 Iron deficiency anemia secondary to blood loss (chronic): Secondary | ICD-10-CM

## 2022-05-11 DIAGNOSIS — W19XXXS Unspecified fall, sequela: Secondary | ICD-10-CM

## 2022-05-11 DIAGNOSIS — I152 Hypertension secondary to endocrine disorders: Secondary | ICD-10-CM

## 2022-05-11 MED ORDER — AMLODIPINE BESYLATE 5 MG PO TABS: 5.0000 mg | ORAL_TABLET | Freq: Every day | ORAL | 3 refills | Status: DC

## 2022-05-11 NOTE — Progress Notes (Signed)
Virtual Visit Encounter mychart visit.   I connected with  Paul [name] on 05/23/22 at  1:30 PM EST by secure video and audio telemedicine application. I verified that I am speaking with the correct person using two identifiers.   I introduced myself as a Designer, jewellery with the practice. The limitations of evaluation and management by telemedicine discussed with the patient and the availability of in person appointments. The patient expressed verbal understanding and consent to proceed.  Participating parties in this visit include: Myself and patient  The patient is: Patient Location: Home I am: Provider Location: Office/Clinic Subjective:    CC and HPI: Paul [name] is a 72 y.o. year old male presenting for follow up of Chronic Conditions.   Med check, not walking to well because of his back, going to chiropractor. Needs refill on amlodipine,    Patient reports the following: He tells me his back is really bothering him. He is having a hard time getting around due to the pain. This is a chronic condition with exacerbation. He is seeing a chiropractor and getting some relief.   He tells me that his blood sugar is running 160 in the mornings when he wakes up. During the day he tells me his blood sugars are not well controlled. He is having a hard time gaining access to healthy food options due to the cost. He lives on a very fixed income and the cost of living has created a burden on him financially. He feels that he could keep his blood sugars more controlled if he had access to better food options.   He is not routinely checking his blood pressure at home. He is not having any HA, CP, palpitations. He does have ShOB at baseline, but this is not worse than usual.   Past medical history, Surgical history, Family history not pertinant except as noted below, Social history, Allergies, and medications have been entered into the medical record, reviewed, and corrections made.   Review of  Systems:  All review of systems negative except what is listed in the HPI  Objective:    Alert and oriented x 4 Speaking in clear sentences with no shortness of breath. No distress.  Impression and Recommendations:    Problem List Items Addressed This Visit     Diabetes mellitus with stage 3 chronic kidney disease, with long-term current use of insulin (HCC)    Chronic.  Blood sugars are not well-controlled at this time.  Patient has been limiting the amount of insulin he is using due to concerns with increased cost and difficulty affording all medications.  Unfortunately this appears to be resulting in blood sugar levels that are outside of the expected limits.  Will send referral to chronic care management today for evaluation and discussion of resources that may be available to help the patient with medications and healthy food options.  Will continue to monitor.  Plate and look for resources for the patient as well.  He has been encouraged to take his medication as prescribed and to notify us if he begins to run low and medication is not covered by insurance for cost would create a financial burden.  We can work to see if samples can be obtained.      Relevant Orders   AMB Referral to Chronic Care Management Services   CAD (coronary artery disease), native coronary artery    Chronic. Referral to chronic care management in the setting of multiple co-morbidities with concerns for food insecurity  and difficult to control diabetes. Recommend continuation on current medications. Will continue to monitor.       Relevant Medications   amLODipine (NORVASC) 5 MG tablet   Other Relevant Orders   AMB Referral to Chronic Care Management Services   Chronic kidney disease, stage 3a ([place])    Chronic kidney disease in the setting of severe cardiovascular disease and uncontrolled diabetes.  Unfortunately he is struggling to maintain his blood sugar control due to cost of both healthy foods and  medications.  We will send a referral today for chronic care management to see if there are resources available that can help him with medication and food assistance.  Encouraged him to work to take his medications as prescribed to help reduce the risks of worsening chronic kidney disease and comorbid conditions.  He expressed understanding.  Will plan to follow-up in 3 months or sooner if needed.      Relevant Orders   AMB Referral to Chronic Care Management Services   Hypertension associated with diabetes ([place]) - Primary    Chronic. Not checking BP at home routinely. No alarm symptoms are present. Labs collected at last visit and reviewed. Will send refill of amlodipine for patient today. Will also send referral to chronic care management for chronic conditions. Plan to follow-up in 3 months or sooner if needed.       Relevant Medications   amLODipine (NORVASC) 5 MG tablet   Other Relevant Orders   AMB Referral to Chronic Care Management Services   Peripheral vascular disease [facility])    Chronic care management referral sent today for patient assistance with medication and resources.       Relevant Medications   amLODipine (NORVASC) 5 MG tablet   Other Relevant Orders   AMB Referral to Chronic Care Management Services   Chronic heart failure [facility])    Referral to chronic care management for medication and resource assistance. No changes to the plan of care at this time.       Relevant Medications   amLODipine (NORVASC) 5 MG tablet   Other Relevant Orders   AMB Referral to Chronic Care Management Services   Fall    Chronic weakness and falls noted.  Referral placed to chronic care management today.      Relevant Orders   AMB Referral to Chronic Care Management Services   Weak    Chronic weakness in the setting of multiple comorbidities and recurrent falls.  Chronic care management referral placed.      Relevant Orders   AMB Referral to Chronic Care Management Services   Iron  deficiency anemia    Chronic iron deficiency anemia.  It is suspected that there is some form of GI blood loss as there are no other indications for blood loss that have been found.  Colonoscopy has been recommended however the patient does express concerns about cost of this procedure.  Given the significance of his anemia I do feel that this procedure is necessary, however I understand the concerns he is having.  We will send a referral to chronic care management to see if there are any initiatives that are available to with the cost of a diagnostic colonoscopy.  We will continue to monitor closely.  He is aware of emergency symptoms that would warrant immediate evaluation.      Relevant Orders   AMB Referral to Chronic Care Management Services   Other Visit Diagnoses     COPD mixed type ([place])   (Chronic)  Relevant Orders   AMB Referral to Chronic Care Management Services   Poorly controlled type 2 diabetes mellitus with peripheral neuropathy (HCC)   (Chronic)     Relevant Orders   AMB Referral to Chronic Care Management Services   Statin intolerance           orders and follow up as documented in EMR I discussed the assessment and treatment plan with the patient. The patient was provided an opportunity to ask questions and all were answered. The patient agreed with the plan and demonstrated an understanding of the instructions.   The patient was advised to call back or seek an in-person evaluation if the symptoms worsen or if the condition fails to improve as anticipated.  Follow-Up: in 3 months  I provided 26 minutes of non-face-to-face interaction with this non face-to-face encounter including intake, same-day documentation, and chart review.   Orma Render, NP , DNP, AGNP-c [facility]

## 2022-05-11 NOTE — Telephone Encounter (Addendum)
I called the pt as he had not called the office yet to go over his instructions for Lexiscan. Pt has been advised of instructions for Lexiscan, aware to arrive 15-20 minutes early for registration. Pt has f/u with Nicholes Rough, [facility] 05/15/22. Pt said he wanted to let our office know that he is now seeing a chiropractor.   I will forward notes to Nicholes Rough, [facility] as pt has appt 05/15/22.

## 2022-05-12 ENCOUNTER — Telehealth: Payer: Self-pay

## 2022-05-12 ENCOUNTER — Ambulatory Visit (HOSPITAL_COMMUNITY): Payer: Medicare Other | Attending: Nurse Practitioner

## 2022-05-12 DIAGNOSIS — R0609 Other forms of dyspnea: Secondary | ICD-10-CM | POA: Insufficient documentation

## 2022-05-12 DIAGNOSIS — I25118 Atherosclerotic heart disease of native coronary artery with other forms of angina pectoris: Secondary | ICD-10-CM | POA: Diagnosis not present

## 2022-05-12 DIAGNOSIS — Z951 Presence of aortocoronary bypass graft: Secondary | ICD-10-CM

## 2022-05-12 DIAGNOSIS — D509 Iron deficiency anemia, unspecified: Secondary | ICD-10-CM | POA: Diagnosis not present

## 2022-05-12 LAB — MYOCARDIAL PERFUSION IMAGING
LV dias vol: 70 mL (ref 62–150)
LV sys vol: 29 mL
Nuc Stress EF: 58 %
Peak HR: 76 {beats}/min
Rest HR: 62 {beats}/min
Rest Nuclear Isotope Dose: 10.2 mCi
SDS: 0
SRS: 0
SSS: 0
ST Depression (mm): 0 mm
Stress Nuclear Isotope Dose: 30.2 mCi
TID: 1.09

## 2022-05-12 MED ORDER — TECHNETIUM TC 99M TETROFOSMIN IV KIT
10.2000 | PACK | Freq: Once | INTRAVENOUS | Status: AC | PRN
  Administered 2022-05-12: 10.2 via INTRAVENOUS

## 2022-05-12 MED ORDER — TECHNETIUM TC 99M TETROFOSMIN IV KIT
30.2000 | PACK | Freq: Once | INTRAVENOUS | Status: AC | PRN
  Administered 2022-05-12: 30.2 via INTRAVENOUS

## 2022-05-12 MED ORDER — REGADENOSON 0.4 MG/5ML IV SOLN
0.4000 mg | Freq: Once | INTRAVENOUS | Status: AC
  Administered 2022-05-12: 0.4 mg via INTRAVENOUS

## 2022-05-12 NOTE — Telephone Encounter (Signed)
   Telephone encounter was:  Successful.  05/12/2022 Name: [name] MRN: [id number] DOB: [birth year]  [name] is a 72 y.o. year old male who is a primary care patient of Early, [name], NP . The community resource team was consulted for assistance with [place] guide performed the following interventions: Patient provided with information about care guide support team and interviewed to confirm resource needs.Patient is having financial strain and cant afford food. Patient is disabled. I have mailed resources added referrals to [place] for Food Pantries and Meals on Wheels   Follow Up Plan:  No further follow up planned at this time. The patient has been provided with needed resources.    [place], Care Management  [phone] [address], [place], [place] [address] Phone: [phone] Email: [email or web address]

## 2022-05-12 NOTE — Progress Notes (Unsigned)
  Chronic Care Management   Note  05/12/2022 Name: Elvis Laufer MRN: [id number] DOB: 01-05-1951  Jacobe Study is a 72 y.o. year old male who is a primary care patient of Early, [name], NP. I reached out to [name] by phone today in response to a referral sent by Mr. Cadin Antillon's PCP.  The first contact attempt was unsuccessful.   Follow up plan: Additional outreach attempts will be made.  Noreene Larsson, [place], [place] [address] Direct Dial: [phone] [email or web address]

## 2022-05-15 ENCOUNTER — Telehealth: Payer: Self-pay | Admitting: *Deleted

## 2022-05-15 ENCOUNTER — Ambulatory Visit: Payer: Medicare Other | Attending: Nurse Practitioner | Admitting: Physician Assistant

## 2022-05-15 NOTE — Progress Notes (Signed)
  Care Coordination   Note   05/15/2022 Name: Paul [name] MRN: [id number] DOB: 1950/05/13  Paul [name] is a 72 y.o. year old male who sees Early, [name], NP for primary care. I reached out to [name] by phone today to offer care coordination services.  Paul [name] was given information about Care Coordination services today including:   The Care Coordination services include support from the care team which includes your Nurse Coordinator, Clinical Social Worker, or Pharmacist.  The Care Coordination team is here to help remove barriers to the health concerns and goals most important to you. Care Coordination services are voluntary, and the patient may decline or stop services at any time by request to their care team member.   Care Coordination Consent Status: Patient agreed to services and verbal consent obtained.   Follow up plan:  Telephone appointment with care coordination team member scheduled for:  05/22/22  Encounter Outcome:  Pt. Scheduled  [place]  Direct Dial: [phone]

## 2022-05-16 ENCOUNTER — Telehealth: Payer: Self-pay

## 2022-05-16 DIAGNOSIS — M5442 Lumbago with sciatica, left side: Secondary | ICD-10-CM | POA: Diagnosis not present

## 2022-05-16 DIAGNOSIS — M9903 Segmental and somatic dysfunction of lumbar region: Secondary | ICD-10-CM | POA: Diagnosis not present

## 2022-05-16 NOTE — Progress Notes (Signed)
   Care Guide Note  05/16/2022 Name: [name] MRN: [id number] DOB: 02-May-1950  Referred by: Orma Render, NP Reason for referral : Chronic Care Management (Outreach to schedule ref with RNCM and Pharm d )   Paul [name] is a 72 y.o. year old male who is a primary care patient of Early, [name], NP. Quan Cybulski was referred to the pharmacist for assistance related to DM.    Successful contact was made with the patient to discuss pharmacy services including being ready for the pharmacist to call at least 5 minutes before the scheduled appointment time, to have medication bottles and any blood sugar or blood pressure readings ready for review. The patient agreed to meet with the pharmacist via with the pharmacist via telephone visit on (date/time).  05/19/2022  Noreene Larsson, [place], [place] [address] Direct Dial: [phone] [email or web address]

## 2022-05-16 NOTE — Telephone Encounter (Signed)
..  Pre-operative Risk Assessment    Patient Name: Paul [name]  DOB: 21-Nov-1950 MRN: [id number]      Request for Surgical Clearance   ENDOSCOPY Procedure:     COLONOSCOPY  Date of Surgery:  Clearance TBD                                 Surgeon:  Deliah Goody Surgeon's Group or Practice Name:  [facility] Phone number:  [phone] Fax number:  [phone]   Type of Clearance Requested:   - Medical  - Pharmacy:  Hold Aspirin     Type of Anesthesia:   PROPOFOL   Additional requests/questions:    Gwenlyn Found   05/16/2022, 12:49 PM

## 2022-05-16 NOTE — Progress Notes (Signed)
  Chronic Care Management   Note  05/16/2022 Name: Paul [name] MRN: [id number] DOB: 16-Jun-1950  Paul [name] is a 72 y.o. year old male who is a primary care patient of Early, [name], NP. I reached out to [name] by phone today in response to a referral sent by Paul [name]'s PCP.  Paul [name] was given information about Chronic Care Management services today including:  CCM service includes personalized support from designated clinical staff supervised by the physician, including individualized plan of care and coordination with other care providers 24/7 contact phone numbers for assistance for urgent and routine care needs. Service will only be billed when office clinical staff spend 20 minutes or more in a month to coordinate care. Only one practitioner may furnish and bill the service in a calendar month. The patient may stop CCM services at amy time (effective at the end of the month) by phone call to the office staff. The patient will be responsible for cost sharing (co-pay) or up to 20% of the service fee (after annual deductible is met)  Paul [name]  agreedto scheduling an appointment with the CCM RN Case Manager   Follow up plan: Patient agreed to scheduled appointment with RN Case Manager on 05/18/2022(date/time).   Paul [name], [place],  [address] Direct Dial: [phone] [email or web address]

## 2022-05-16 NOTE — Telephone Encounter (Signed)
   Primary Cardiologist: Peter Martinique, MD  Chart reviewed as part of pre-operative protocol coverage. Given past medical history and time since last visit, based on ACC/AHA guidelines, Rameses Ou would be at acceptable risk for the planned procedure without further cardiovascular testing.   Called patient to review test results and discuss risks for upcoming colonoscopy. I saw him on 04/03/22 and his follow-up appointments were rescheduled due to provider absences. He continues to have weakness and shortness of breath but he is able to achieve > 4 METS activity without significant cardiac symptoms. His RCRI for MACE is 6.6%.   Patient was advised that if he develops new symptoms prior to surgery to contact our office to arrange a follow-up appointment. He verbalized understanding.  I will route this recommendation to the requesting party via Epic fax function and remove from pre-op pool.  Please call with questions.  Emmaline Life, NP-C  05/16/2022, 1:37 PM [address] Office [phone] Fax [phone]

## 2022-05-17 ENCOUNTER — Other Ambulatory Visit (HOSPITAL_COMMUNITY): Payer: Self-pay

## 2022-05-17 ENCOUNTER — Other Ambulatory Visit: Payer: Medicare Other

## 2022-05-17 DIAGNOSIS — M9903 Segmental and somatic dysfunction of lumbar region: Secondary | ICD-10-CM | POA: Diagnosis not present

## 2022-05-17 DIAGNOSIS — M5442 Lumbago with sciatica, left side: Secondary | ICD-10-CM | POA: Diagnosis not present

## 2022-05-17 MED ORDER — HYDROCODONE-ACETAMINOPHEN 5-325 MG PO TABS
1.0000 | ORAL_TABLET | Freq: Three times a day (TID) | ORAL | 0 refills | Status: DC
  Filled 2022-07-17: qty 120, 20d supply, fill #0

## 2022-05-17 MED ORDER — HYDROCODONE-ACETAMINOPHEN 5-325 MG PO TABS
1.0000 | ORAL_TABLET | Freq: Three times a day (TID) | ORAL | 0 refills | Status: DC
  Filled 2022-05-17: qty 120, 30d supply, fill #0

## 2022-05-17 MED ORDER — HYDROCODONE-ACETAMINOPHEN 5-325 MG PO TABS
1.0000 | ORAL_TABLET | Freq: Three times a day (TID) | ORAL | 0 refills | Status: DC
  Filled 2022-06-16: qty 120, 20d supply, fill #0

## 2022-05-18 ENCOUNTER — Ambulatory Visit (INDEPENDENT_AMBULATORY_CARE_PROVIDER_SITE_OTHER): Payer: Medicare Other | Admitting: *Deleted

## 2022-05-18 DIAGNOSIS — J449 Chronic obstructive pulmonary disease, unspecified: Secondary | ICD-10-CM

## 2022-05-18 DIAGNOSIS — E1165 Type 2 diabetes mellitus with hyperglycemia: Secondary | ICD-10-CM

## 2022-05-18 DIAGNOSIS — I152 Hypertension secondary to endocrine disorders: Secondary | ICD-10-CM

## 2022-05-18 NOTE — Plan of Care (Signed)
Chronic Care Management Provider Comprehensive Care Plan    05/18/2022 Name: Paul [name] MRN: [id number] DOB: December 31, 1950  Referral to Chronic Care Management (CCM) services was placed by Provider:  Jacolyn Reedy NP on Date: 05/11/22.  Chronic Condition 1: DIABETES Provider Assessment and Plan Poorly controlled type 2 diabetes mellitus with peripheral neuropathy ([place]) - Primary     Relevant Medications    AMBULATORY NON FORMULARY MEDICATION    Other Relevant Orders    POCT glycosylated hemoglobin (Hb A1C) (Completed)      Expected Outcome/Goals Addressed This Visit (Provider CCM goals/Provider Assessment and plan  CCM (DIABETES) EXPECTED OUTCOME: MONITOR, SELF-MANAGE AND REDUCE SYMPTOMS OF DIABETES  Symptom Management Condition 1: Take medications as prescribed   Attend all scheduled provider appointments Call pharmacy for medication refills 3-7 days in advance of running out of medications Attend church or other social activities Call provider office for new concerns or questions  Work with the social worker to address care coordination needs and will continue to work with the clinical team to address health care and disease management related needs check blood sugar at prescribed times: continuous glucose monitor per patient report  check feet daily for cuts, sores or redness enter blood sugar readings and medication or insulin into daily log take the blood sugar log to all doctor visits take the blood sugar meter to all doctor visits trim toenails straight across fill half of plate with vegetables limit fast food meals to no more than 1 per week manage portion size prepare main meal at home 3 to 5 days each week read food labels for fat, fiber, carbohydrates and portion size keep feet up while sitting Look over education mailed- hypoglycemia Social worker to call on 05/22/22 Pharmacist to call on 05/19/22  Chronic Condition 2: COPD Provider Assessment and Plan Chronic obstructive  pulmonary disease, unspecified ([place]) Pulmonary function testing today confirms mild to moderate obstruction.  He has not gotten much benefit from [employer].  Question whether much of his dyspnea is deconditioning especially since his CAD has been addressed as well.  It is possible that he is not delivering the Stiolto adequately, I will change him to an HFA LABA/LAMA > Bevespi.  Keep albuterol available to use if needed.  He would likely benefit from cardiopulmonary rehab.  He is willing to try this.  I will make the referral.   Please stop Stiolto. Try starting Bevespi 2 puffs twice a day.  Take this medication on a schedule. Keep albuterol available to use 2 puffs if needed for shortness of breath, chest tightness, wheezing. We will refer you to cardiopulmonary rehab at [facility]. Follow with Dr Lamonte Sakai in 6 months or sooner if you have any problems   Expected Outcome/Goals Addressed This Visit (Provider CCM goals/Provider Assessment and plan  CCM (COPD) EXPECTED OUTCOME: MONITOR, SELF-MANAGE AND REDUCE SYMPTOMS OF COPD  Symptom Management Condition 2: Take medications as prescribed   Attend all scheduled provider appointments Call pharmacy for medication refills 3-7 days in advance of running out of medications Attend church or other social activities Call provider office for new concerns or questions  identify and remove indoor air pollutants limit outdoor activity during cold weather listen for public air quality announcements every day do breathing exercises every day develop a rescue plan eliminate symptom triggers at home follow rescue plan if symptoms flare-up get at least 7 to 8 hours of sleep at night practice relaxation or meditation daily Look over education mailed- COPD action plan  fall prevention strategies: change position slowly, use assistive device such as walker or cane (per provider recommendations) when walking, keep walkways clear, have good lighting in room. It is  important to contact your provider if you have any falls, maintain muscle strength/tone by exercise per provider recommendations.  Chronic Condition 3: HYPERTENSION Provider Assessment and Plan    Hypertension associated with diabetes ([place])      Chronic history of hypertension with recent blood pressures on the lower end of normal.  Given low blood pressure today I am concerned that an episode of hypotension may have prompted the recent fall.  Patient is currently on amlodipine 5 mg.  Patient has been instructed to stop amlodipine today and not to take this anymore.  We will continue to monitor blood pressures closely and make changes as necessary.       Expected Outcome/Goals Addressed This Visit (Provider CCM goals/Provider Assessment and plan  CCM (HYPERTENSION) EXPECTED OUTCOME: MONITOR, SELF-MANAGE AND REDUCE SYMPTOMS OF HYPERTENSION  Symptom Management Condition 3: Take medications as prescribed   Attend all scheduled provider appointments Call pharmacy for medication refills 3-7 days in advance of running out of medications Attend church or other social activities Call provider office for new concerns or questions  check blood pressure weekly choose a place to take my blood pressure (home, clinic or office, retail store) write blood pressure results in a log or diary learn about high blood pressure keep a blood pressure log take blood pressure log to all doctor appointments keep all doctor appointments take medications for blood pressure exactly as prescribed report new symptoms to your doctor eat more whole grains, fruits and vegetables, lean meats and healthy fats Look over education mailed- low sodium diet  Problem List Patient Active Problem List   Diagnosis Date Noted   Iron deficiency anemia 04/16/2022   Fall 04/13/2022   Weak 04/13/2022   Bilateral leg and foot pain 02/14/2022   Fatigue 12/08/2021   Chronic heart failure ([place]) 12/08/2021   Chronic pain syndrome  08/15/2021   BPH without obstruction/lower urinary tract symptoms 06/13/2021   Chronic left shoulder pain 06/13/2021   Diverticulosis 06/13/2021   Generalized osteoarthritis of multiple sites 06/13/2021   Right hip pain 06/13/2021   Unsteady gait 06/13/2021   Psychophysiological insomnia 06/13/2021   S/P CABG x 2 01/25/2021   CAD (coronary artery disease), native coronary artery 01/25/2021   Angina, class III ([place]) 12/28/2020   Chronic kidney disease, stage 3a ([place]) 03/28/2019   Leukocytosis 03/11/2019   Vitamin D deficiency 12/31/2018   Lipoprotein deficiency disorder 08/09/2018   Primary osteoarthritis, left shoulder 07/29/2018   Pain 07/25/2018   Hearing loss 06/13/2018   Diabetic polyneuropathy associated with type 2 diabetes mellitus ([place]) 05/28/2018   Diabetes mellitus with stage 3 chronic kidney disease, with long-term current use of insulin ([place])    Allergic rhinitis 10/31/2017   Peripheral vascular disease ([place]) 06/22/2017   Lumbago with sciatica 02/22/2017   Spondylolisthesis, congenital 02/22/2017   Spondylolysis 02/22/2017   Low back pain 01/15/2017   Elevated levels of transaminase & lactic acid dehydrogenase 06/09/2016   Gastroesophageal reflux disease 03/02/2016   Coronary angioplasty status 02/17/2015   Hypertension associated with diabetes ([place]) 02/17/2015   Gout 02/17/2015   Shoulder joint pain 02/17/2015   Cigarette smoker 02/22/2014   DOE (dyspnea on exertion) 01/20/2014   DDD (degenerative disc disease), cervical 03/10/2013   Major depression, single episode 03/10/2013   Obesity (BMI 30.0-34.9)    Anxiety 05/02/2012  Hyperlipidemia with target LDL less than 70 11/24/2011   Former smoker 11/24/2011   OSA and COPD overlap syndrome ([place]) 11/24/2011    Medication Management  Current Outpatient Medications:    acetaminophen (TYLENOL) 500 MG tablet, Take 1,500-2,000 mg by mouth daily., Disp: , Rfl:    albuterol (VENTOLIN HFA) 108 (90 Base) MCG/ACT  inhaler, Inhale 2 puffs into the lungs every 4 (four) hours as needed. For breathing., Disp: 18 g, Rfl: 3   allopurinol (ZYLOPRIM) 100 MG tablet, Take 1 tablet (100 mg total) by mouth daily., Disp: 90 tablet, Rfl: 3   AMBULATORY NON FORMULARY MEDICATION, Medical alert device as covered by insurance for frequent falls and weakness., Disp: 1 each, Rfl: 0   amLODipine (NORVASC) 5 MG tablet, Take 1 tablet (5 mg total) by mouth daily., Disp: 90 tablet, Rfl: 3   furosemide (LASIX) 20 MG tablet, Take 1 tablet (20 mg total) by mouth daily., Disp: 90 tablet, Rfl: 3   gabapentin (NEURONTIN) 300 MG capsule, Take 2 capsules (600 mg total) by mouth 3 (three) times daily. For back and foot pain., Disp: 540 capsule, Rfl: 3   glucose blood (ONETOUCH VERIO) test strip, Use to check blood glucose 3 times daily, Disp: 300 each, Rfl: 3   HYDROcodone-acetaminophen (NORCO/VICODIN) 5-325 MG tablet, Take 1-2 tablets by mouth in the morning, 1 tablet at noon, and at 1 tablet at bedtime., Disp: 120 tablet, Rfl: 0   insulin glargine, 1 Unit Dial, (TOUJEO SOLOSTAR) 300 UNIT/ML Solostar Pen, Inject 40 Units into the skin daily., Disp: 18 mL, Rfl: 3   insulin lispro (HUMALOG KWIKPEN) 200 UNIT/ML KwikPen, INJECT 14 UNITS UNDER THE SKIN EVERY MORNING, 14 UNITS WITH LUNCH AND 14 UNITS WITH SUPPER, DAILY, Disp: 12 mL, Rfl: 5   Insulin Pen Needle 32G X 4 MM MISC, Use daily with insulin as directed, Disp: 100 each, Rfl: 3   Iron, Ferrous Sulfate, 325 (65 Fe) MG TABS, Take 1 tablet (325 mg) by mouth daily., Disp: 30 tablet, Rfl: 3   metFORMIN (GLUCOPHAGE-XR) 500 MG 24 hr tablet, Take 2 tablets (1,000 mg total) by mouth 2 (two) times daily., Disp: 360 tablet, Rfl: 3   metoprolol tartrate (LOPRESSOR) 25 MG tablet, Take 1 tablet (25 mg total) by mouth 2 (two) times daily., Disp: 180 tablet, Rfl: 3   pantoprazole (PROTONIX) 40 MG tablet, Take 1 tablet (40 mg total) by mouth daily., Disp: 90 tablet, Rfl: 3   potassium chloride (KLOR-CON M) 10  MEQ tablet, Take 1 tablet (10 mEq total) by mouth daily., Disp: 90 tablet, Rfl: 3   Tetrahydrozoline HCl (VISINE EXTRA OP), Place 1 drop into both eyes as needed (itching)., Disp: , Rfl:    umeclidinium-vilanterol (ANORO ELLIPTA) 62.5-25 MCG/ACT AEPB, Inhale 1 puff into the lungs daily., Disp: 90 each, Rfl: 3   zolpidem (AMBIEN) 10 MG tablet, Take 1 tablet (10 mg total) by mouth at bedtime as needed for sleep, Disp: 90 tablet, Rfl: 3   aspirin 81 MG EC tablet, Take 1 tablet (81 mg total) by mouth daily. (Patient not taking: Reported on 05/18/2022), Disp: 30 tablet, Rfl: 0   HYDROcodone-acetaminophen (NORCO/VICODIN) 5-325 MG tablet, Take 1 tablet by mouth every 8 (eight) hours as needed fo rpain (Patient not taking: Reported on 05/18/2022), Disp: 90 tablet, Rfl: 0   [START ON 06/14/2022] HYDROcodone-acetaminophen (NORCO/VICODIN) 5-325 MG tablet, Take 1-2 tablets by mouth in the morning, 1 tablet at noon, and 1 tablet at bedtime. (Patient not taking: Reported on 05/18/2022), Disp:  120 tablet, Rfl: 0   [START ON 07/12/2022] HYDROcodone-acetaminophen (NORCO/VICODIN) 5-325 MG tablet, Take 1-2 tablets by mouth in the morning, 1 tablet at noon, and 1 tablet at bedtime. (Patient not taking: Reported on 05/18/2022), Disp: 120 tablet, Rfl: 0  Cognitive Assessment Identity Confirmed: : Name; DOB Cognitive Status: Normal Other:  : N/A   Functional Assessment Hearing Difficulty or Deaf: no Hearing Management: Hearing Loss Wear Glasses or Blind: yes Vision Management: can see well w/ glasses Concentrating, Remembering or Making Decisions Difficulty (CP): no Difficulty Communicating: no Difficulty Eating/Swallowing: no Walking or Climbing Stairs Difficulty: no Dressing/Bathing Difficulty: no Doing Errands Independently Difficulty (such as shopping) (CP): no Change in Functional Status Since Onset of Current Illness/Injury: no   Caregiver Assessment  Primary Source of Support/Comfort: sibling(s) Name of  Support/Comfort Primary Source: can call on brother or ex son in law People in Home: alone Family Caregiver if Needed: sibling(s) Family Caregiver Names: [place] - Surveyor, mining Primary Roles/Responsibilities: retired Expected Impact of Illness/Hospitalization: N/A Concerns About Impact on Relationships: N/A   Planned Interventions  Evaluation of current treatment plan related to hypertension self management and patient's adherence to plan as established by provider;   Reviewed prescribed diet low sodium Reviewed medications with patient and discussed importance of compliance;  Counseled on the importance of exercise goals with target of 150 minutes per week Discussed plans with patient for ongoing care management follow up and provided patient with direct contact information for care management team; Advised patient, providing education and rationale, to monitor blood pressure daily and record, calling PCP for findings outside established parameters;  Discussed complications of poorly controlled blood pressure such as heart disease, stroke, circulatory complications, vision complications, kidney impairment, sexual dysfunction;  Screening for signs and symptoms of depression related to chronic disease state;  Assessed social determinant of health barriers;  Provided education to patient about basic DM disease process; Reviewed medications with patient and discussed importance of medication adherence;        Reviewed prescribed diet with patient carbohydrate modified; Counseled on importance of regular laboratory monitoring as prescribed;        Discussed plans with patient for ongoing care management follow up and provided patient with direct contact information for care management team;      Provided patient with written educational materials related to hypo and hyperglycemia and importance of correct treatment;       Advised patient, providing education and rationale, to check cbg CGM per pt   and record        Review of patient status, including review of consultants reports, relevant laboratory and other test results, and medications completed;       Screening for signs and symptoms of depression related to chronic disease state;        Assessed social determinant of health barriers;        Provided patient with basic written and verbal COPD education on self care/management/and exacerbation prevention Advised patient to track and manage COPD triggers Provided instruction about proper use of medications used for management of COPD including inhalers Advised patient to self assesses COPD action plan zone and make appointment with provider if in the yellow zone for 48 hours without improvement Advised patient to engage in light exercise as tolerated 3-5 days a week to aid in the the management of COPD Provided education about and advised patient to utilize infection prevention strategies to reduce risk of respiratory infection Discussed the importance of adequate rest and management of  fatigue with COPD Screening for signs and symptoms of depression related to chronic disease state  Assessed social determinant of health barriers Reviewed safety precautions  Interaction and coordination with outside resources, practitioners, and providers See CCM Referral  Care Plan: Printed and mailed to patient

## 2022-05-18 NOTE — Patient Instructions (Signed)
Please call the care guide team at [phone] if you need to cancel or reschedule your appointment.   If you are experiencing a Mental Health or [place] or need someone to talk to, please call the Suicide and Crisis Lifeline: 988 call the [place] National Suicide Prevention Lifeline: 432-738-6951 or TTY: 828-329-0887 TTY 819-677-2579) to talk to a trained counselor call 1-800-273-TALK (toll free, 24 hour hotline) go to [facility] [address] [place] [phone]) call 911   Following is a copy of the CCM Program Consent:  CCM service includes personalized support from designated clinical staff supervised by the physician, including individualized plan of care and coordination with other care providers 24/7 contact phone numbers for assistance for urgent and routine care needs. Service will only be billed when office clinical staff spend 20 minutes or more in a month to coordinate care. Only one practitioner may furnish and bill the service in a calendar month. The patient may stop CCM services at amy time (effective at the end of the month) by phone call to the office staff. The patient will be responsible for cost sharing (co-pay) or up to 20% of the service fee (after annual deductible is met)  Following is a copy of your full provider care plan:   Goals Addressed             This Visit's Progress    CCM (COPD) EXPECTED OUTCOME: MONITOR, SELF-MANAGE AND REDUCE SYMTOM OF COPD       Current Barriers:  Knowledge Deficits related to COPD management Chronic Disease Management support and education needs related to COPD, action plan, falls Financial Constraints. - Education officer, museum will be working with pt Patient reports he has all medications and taking as prescribed Patient has shower seat, walker, cane, has relatives he can call on if needed Patient has had several falls this past year due to having balance issues at  times  Planned Interventions: Provided patient with basic written and verbal COPD education on self care/management/and exacerbation prevention Advised patient to track and manage COPD triggers Provided instruction about proper use of medications used for management of COPD including inhalers Advised patient to self assesses COPD action plan zone and make appointment with provider if in the yellow zone for 48 hours without improvement Advised patient to engage in light exercise as tolerated 3-5 days a week to aid in the the management of COPD Provided education about and advised patient to utilize infection prevention strategies to reduce risk of respiratory infection Discussed the importance of adequate rest and management of fatigue with COPD Screening for signs and symptoms of depression related to chronic disease state  Assessed social determinant of health barriers Reviewed safety precautions  Symptom Management: Take medications as prescribed   Attend all scheduled provider appointments Call pharmacy for medication refills 3-7 days in advance of running out of medications Attend church or other social activities Call provider office for new concerns or questions  identify and remove indoor air pollutants limit outdoor activity during cold weather listen for public air quality announcements every day do breathing exercises every day develop a rescue plan eliminate symptom triggers at home follow rescue plan if symptoms flare-up get at least 7 to 8 hours of sleep at night practice relaxation or meditation daily Look over education mailed- COPD action plan fall prevention strategies: change position slowly, use assistive device such as walker or cane (per provider recommendations) when walking, keep walkways clear, have good lighting in  room. It is important to contact your provider if you have any falls, maintain muscle strength/tone by exercise per provider  recommendations.  Follow Up Plan: Telephone follow up appointment with care management team member scheduled for:  07/03/22 at 130 pm       CCM (DIABETES) EXPECTED OUTCOME: MONITOR, SELF-MANAGE AND REDUCE SYMPTOMS OF DIABETES       Current Barriers:  Knowledge Deficits related to Diabetes management Care Coordination needs related to social work needs in a patient with Diabetes Chronic Disease Management support and education needs related to Diabetes, diet Financial Constraints. - pt states he is not always able to afford, food, states he received a call today for resources for Food banks, states he is headed to a food bank today and has needed resources. Social worker is to call pt on 05/22/22 and pharmD to call pt on 05/19/22.   Patient reports CBG is checked with continuous glucose monitor, fasting ranges 160-170 and pt states " due to my diet", random ranges 170's Patient reports he has low energy and does not exercise, has walker and cane and uses as needed  Planned Interventions: Provided education to patient about basic DM disease process; Reviewed medications with patient and discussed importance of medication adherence;        Reviewed prescribed diet with patient carbohydrate modified; Counseled on importance of regular laboratory monitoring as prescribed;        Discussed plans with patient for ongoing care management follow up and provided patient with direct contact information for care management team;      Provided patient with written educational materials related to hypo and hyperglycemia and importance of correct treatment;       Advised patient, providing education and rationale, to check cbg CGM per pt  and record        Review of patient status, including review of consultants reports, relevant laboratory and other test results, and medications completed;       Screening for signs and symptoms of depression related to chronic disease state;        Assessed social determinant  of health barriers;         Symptom Management: Take medications as prescribed   Attend all scheduled provider appointments Call pharmacy for medication refills 3-7 days in advance of running out of medications Attend church or other social activities Call provider office for new concerns or questions  Work with the social worker to address care coordination needs and will continue to work with the clinical team to address health care and disease management related needs check blood sugar at prescribed times: continuous glucose monitor per patient report  check feet daily for cuts, sores or redness enter blood sugar readings and medication or insulin into daily log take the blood sugar log to all doctor visits take the blood sugar meter to all doctor visits trim toenails straight across fill half of plate with vegetables limit fast food meals to no more than 1 per week manage portion size prepare main meal at home 3 to 5 days each week read food labels for fat, fiber, carbohydrates and portion size keep feet up while sitting Look over education mailed- hypoglycemia Social worker to call on 05/22/22 Pharmacist to call on 05/19/22  Follow Up Plan: Telephone follow up appointment with care management team member scheduled for:  07/03/22 at 130 pm       CCM (HYPERTENSION) EXPECTED OUTCOME: MONITOR, SELF-MANAGE AND REDUCE SYMPTOMS OF HYPERTENSION  Current Barriers:  Knowledge Deficits related to Hypertension management Chronic Disease Management support and education needs related to Hypertension, diet Financial Constraints.  Patient reports he has blood pressure cuff but does not monitor blood pressure  Planned Interventions: Evaluation of current treatment plan related to hypertension self management and patient's adherence to plan as established by provider;   Reviewed prescribed diet low sodium Reviewed medications with patient and discussed importance of compliance;  Counseled  on the importance of exercise goals with target of 150 minutes per week Discussed plans with patient for ongoing care management follow up and provided patient with direct contact information for care management team; Advised patient, providing education and rationale, to monitor blood pressure daily and record, calling PCP for findings outside established parameters;  Discussed complications of poorly controlled blood pressure such as heart disease, stroke, circulatory complications, vision complications, kidney impairment, sexual dysfunction;  Screening for signs and symptoms of depression related to chronic disease state;  Assessed social determinant of health barriers;   Symptom Management: Take medications as prescribed   Attend all scheduled provider appointments Call pharmacy for medication refills 3-7 days in advance of running out of medications Attend church or other social activities Call provider office for new concerns or questions  check blood pressure weekly choose a place to take my blood pressure (home, clinic or office, retail store) write blood pressure results in a log or diary learn about high blood pressure keep a blood pressure log take blood pressure log to all doctor appointments keep all doctor appointments take medications for blood pressure exactly as prescribed report new symptoms to your doctor eat more whole grains, fruits and vegetables, lean meats and healthy fats Look over education mailed- low sodium diet  Follow Up Plan: Telephone follow up appointment with care management team member scheduled for:  07/03/22 at 130 pm          The patient verbalized understanding of instructions, educational materials, and care plan provided today and agreed to receive a mailed copy of patient instructions, educational materials, and care plan.   Telephone follow up appointment with care management team member scheduled for:  07/03/22 at 130 pm  COPD Action  Plan A COPD action plan is a description of what to do when you have a flare (exacerbation) of chronic obstructive pulmonary disease (COPD). Your action plan is a color-coded plan that lists the symptoms that indicate whether your condition is under control and what actions to take. If you have symptoms in the green zone, it means you are doing well that day. If you have symptoms in the yellow zone, it means you are having a bad day or an exacerbation. If you have symptoms in the red zone, you need urgent medical care. Follow the plan that you and your health care provider developed. Review your plan with your health care provider at each visit. Red zone Symptoms in this zone mean that you should get medical help right away. They include: Feeling very short of breath, even when you are resting. Not being able to do any activities because of poor breathing. Not being able to sleep because of poor breathing. Fever or shaking chills. Feeling confused or very sleepy. Chest pain. Coughing up blood. If you have any of these symptoms, call emergency services (911 in the [place] or go to the nearest emergency room. Yellow zone Symptoms in this zone mean that your condition may be getting worse. They include: Feeling more short of breath than usual.  Having less energy for daily activities than usual. Phlegm or mucus that is thicker than usual. Needing to use your rescue inhaler or nebulizer more often than usual. More ankle swelling than usual. Coughing more than usual. Feeling like you have a chest cold. Trouble sleeping due to COPD symptoms. Decreased appetite. COPD medicines not helping as much as usual. If you experience any "yellow" symptoms: Keep taking your daily medicines as directed. Use your quick-relief inhaler as told by your health care provider. If you were prescribed steroid medicine to take by mouth (oral medicine), start taking it as told by your health care provider. If you  were prescribed an antibiotic medicine, start taking it as told by your health care provider. Do not stop taking the antibiotic even if you start to feel better. Use oxygen as told by your health care provider. Get more rest. Do your pursed-lip breathing exercises. Do not smoke. Avoid any irritants in the air. If your signs and symptoms do not improve after taking these steps, call your health care provider right away. Green zone Symptoms in this zone mean that you are doing well. They include: Being able to do your usual activities and exercise. Having the usual amount of coughing, including the same amount of phlegm or mucus. Being able to sleep well. Having a good appetite. Where to find more information: You can find more information about COPD from: American Lung Association, My COPD Action Plan: www.lung.org COPD Foundation: www.copdfoundation.[place]: https://wilson-eaton.com/ Follow these instructions at home: Continue taking your daily medicines as told by your health care provider. Make sure you receive all the immunizations that your health care provider recommends, especially the pneumococcal and influenza vaccines. Wash your hands often with soap and water. Have family members wash their hands too. Regular hand washing can help prevent infections. Follow your usual exercise and diet plan. Avoid irritants in the air, such as smoke. Do not use any products that contain nicotine or tobacco. These products include cigarettes, chewing tobacco, and vaping devices, such as e-cigarettes. If you need help quitting, ask your health care provider. Summary A COPD action plan tells you what to do when you have a flare (exacerbation) of chronic obstructive pulmonary disease (COPD). Follow each action plan for your symptoms. If you have any symptoms in the red zone, call emergency services (911 in the [place] or go to the nearest emergency room. This information is  not intended to replace advice given to you by your health care provider. Make sure you discuss any questions you have with your health care provider. Document Revised: 02/10/2020 Document Reviewed: 02/10/2020 Elsevier Patient Education  [place]. Low-Sodium Eating Plan Sodium, which is an element that makes up salt, helps you maintain a healthy balance of fluids in your body. Too much sodium can increase your blood pressure and cause fluid and waste to be held in your body. Your health care provider or dietitian may recommend following this plan if you have high blood pressure (hypertension), kidney disease, liver disease, or heart failure. Eating less sodium can help lower your blood pressure, reduce swelling, and protect your heart, liver, and kidneys. What are tips for following this plan? Reading food labels The Nutrition Facts label lists the amount of sodium in one serving of the food. If you eat more than one serving, you must multiply the listed amount of sodium by the number of servings. Choose foods with less than 140 mg of sodium per serving.  Avoid foods with 300 mg of sodium or more per serving. Shopping  Look for lower-sodium products, often labeled as "low-sodium" or "no salt added." Always check the sodium content, even if foods are labeled as "unsalted" or "no salt added." Buy fresh foods. Avoid canned foods and pre-made or frozen meals. Avoid canned, cured, or processed meats. Buy breads that have less than 80 mg of sodium per slice. Cooking  Eat more home-cooked food and less restaurant, buffet, and fast food. Avoid adding salt when cooking. Use salt-free seasonings or herbs instead of table salt or sea salt. Check with your health care provider or pharmacist before using salt substitutes. Cook with plant-based oils, such as canola, sunflower, or olive oil. Meal planning When eating at a restaurant, ask that your food be prepared with less salt or no salt, if  possible. Avoid dishes labeled as brined, pickled, cured, smoked, or made with soy sauce, miso, or teriyaki sauce. Avoid foods that contain MSG (monosodium glutamate). MSG is sometimes added to [place] food, bouillon, and some canned foods. Make meals that can be grilled, baked, poached, roasted, or steamed. These are generally made with less sodium. General information Most people on this plan should limit their sodium intake to 1,500-2,000 mg (milligrams) of sodium each day. What foods should I eat? Fruits Fresh, frozen, or canned fruit. Fruit juice. Vegetables Fresh or frozen vegetables. "No salt added" canned vegetables. "No salt added" tomato sauce and paste. Low-sodium or reduced-sodium tomato and vegetable juice. Grains Low-sodium cereals, including oats, puffed wheat and rice, and shredded wheat. Low-sodium crackers. Unsalted rice. Unsalted pasta. Low-sodium bread. Whole-grain breads and whole-grain pasta. Meats and other proteins Fresh or frozen (no salt added) meat, poultry, seafood, and fish. Low-sodium canned tuna and salmon. Unsalted nuts. Dried peas, beans, and lentils without added salt. Unsalted canned beans. Eggs. Unsalted nut butters. Dairy Milk. Soy milk. Cheese that is naturally low in sodium, such as ricotta cheese, fresh mozzarella, or Swiss cheese. Low-sodium or reduced-sodium cheese. Cream cheese. Yogurt. Seasonings and condiments Fresh and dried herbs and spices. Salt-free seasonings. Low-sodium mustard and ketchup. Sodium-free salad dressing. Sodium-free light mayonnaise. Fresh or refrigerated horseradish. Lemon juice. Vinegar. Other foods Homemade, reduced-sodium, or low-sodium soups. Unsalted popcorn and pretzels. Low-salt or salt-free chips. The items listed above may not be a complete list of foods and beverages you can eat. Contact a dietitian for more information. What foods should I avoid? Vegetables Sauerkraut, pickled vegetables, and relishes. Olives. [place]  fries. Onion rings. Regular canned vegetables (not low-sodium or reduced-sodium). Regular canned tomato sauce and paste (not low-sodium or reduced-sodium). Regular tomato and vegetable juice (not low-sodium or reduced-sodium). Frozen vegetables in sauces. Grains Instant hot cereals. Bread stuffing, pancake, and biscuit mixes. Croutons. Seasoned rice or pasta mixes. Noodle soup cups. Boxed or frozen macaroni and cheese. Regular salted crackers. Self-rising flour. Meats and other proteins Meat or fish that is salted, canned, smoked, spiced, or pickled. Precooked or cured meat, such as sausages or meat loaves. Berniece Salines. Ham. Pepperoni. Hot dogs. Corned beef. Chipped beef. Salt pork. Jerky. Pickled herring. Anchovies and sardines. Regular canned tuna. Salted nuts. Dairy Processed cheese and cheese spreads. Hard cheeses. Cheese curds. Blue cheese. Feta cheese. String cheese. Regular cottage cheese. Buttermilk. Canned milk. Fats and oils Salted butter. Regular margarine. Ghee. Bacon fat. Seasonings and condiments Onion salt, garlic salt, seasoned salt, table salt, and sea salt. Canned and packaged gravies. Worcestershire sauce. Tartar sauce. Barbecue sauce. Teriyaki sauce. Soy sauce, including reduced-sodium. Steak sauce. Fish  sauce. Oyster sauce. Cocktail sauce. Horseradish that you find on the shelf. Regular ketchup and mustard. Meat flavorings and tenderizers. Bouillon cubes. Hot sauce. Pre-made or packaged marinades. Pre-made or packaged taco seasonings. Relishes. Regular salad dressings. Salsa. Other foods Salted popcorn and pretzels. Corn chips and puffs. Potato and tortilla chips. Canned or dried soups. Pizza. Frozen entrees and pot pies. The items listed above may not be a complete list of foods and beverages you should avoid. Contact a dietitian for more information. Summary Eating less sodium can help lower your blood pressure, reduce swelling, and protect your heart, liver, and kidneys. Most people  on this plan should limit their sodium intake to 1,500-2,000 mg (milligrams) of sodium each day. Canned, boxed, and frozen foods are high in sodium. Restaurant foods, fast foods, and pizza are also very high in sodium. You also get sodium by adding salt to food. Try to cook at home, eat more fresh fruits and vegetables, and eat less fast food and canned, processed, or prepared foods. This information is not intended to replace advice given to you by your health care provider. Make sure you discuss any questions you have with your health care provider. Document Revised: 05/09/2019 Document Reviewed: 03/05/2019 Elsevier Patient Education  [place]. Hypoglycemia Hypoglycemia is when the sugar (glucose) level in your blood is too low. Low blood sugar can happen to people who have diabetes and people who do not have diabetes. Low blood sugar can happen quickly, and it can be an emergency. What are the causes? This condition happens most often in people who have diabetes. It may be caused by: Diabetes medicine. Not eating enough, or not eating often enough. Doing more physical activity. Drinking alcohol on an empty stomach. If you do not have diabetes, this condition may be caused by: A tumor in the pancreas. Not eating enough, or not eating for long periods at a time (fasting). A very bad infection or illness. Problems after having weight loss (bariatric) surgery. Kidney failure or liver failure. Certain medicines. What increases the risk? This condition is more likely to develop in people who: Have diabetes and take medicines to lower their blood sugar. Abuse alcohol. Have a very bad illness. What are the signs or symptoms? Mild Hunger. Sweating and feeling clammy. Feeling dizzy or light-headed. Being sleepy or having trouble sleeping. Feeling like you may vomit (nauseous). A fast heartbeat. A headache. Blurry vision. Mood changes, such as: Being grouchy. Feeling worried  or nervous (anxious). Tingling or loss of feeling (numbness) around your mouth, lips, or tongue. Moderate Confusion and poor judgment. Behavior changes. Weakness. Uneven heartbeat. Trouble with moving (coordination). Very low Very low blood sugar (severe hypoglycemia) is a medical emergency. It can cause: Fainting. Seizures. Loss of consciousness (coma). Death. How is this treated? Treating low blood sugar Low blood sugar is often treated by eating or drinking something that has sugar in it right away. The food or drink should contain 15 grams of a fast-acting carb (carbohydrate). Options include: 4 oz (120 mL) of fruit juice. 4 oz (120 mL) of regular soda (not diet soda). A few pieces of hard candy. Check food labels to see how many pieces to eat for 15 grams. 1 Tbsp (15 mL) of sugar or honey. 4 glucose tablets. 1 tube of glucose gel. Treating low blood sugar if you have diabetes If you can think clearly and swallow safely, follow the 15:15 rule: Take 15 grams of a fast-acting carb. Talk with your doctor about  how much you should take. Always keep a source of fast-acting carb with you, such as: Glucose tablets (take 4 tablets). A few pieces of hard candy. Check food labels to see how many pieces to eat for 15 grams. 4 oz (120 mL) of fruit juice. 4 oz (120 mL) of regular soda (not diet soda). 1 Tbsp (15 mL) of honey or sugar. 1 tube of glucose gel. Check your blood sugar 15 minutes after you take the carb. If your blood sugar is still at or below 70 mg/dL (3.9 mmol/L), take 15 grams of a carb again. If your blood sugar does not go above 70 mg/dL (3.9 mmol/L) after 3 tries, get help right away. After your blood sugar goes back to normal, eat a meal or a snack within 1 hour.  Treating very low blood sugar If your blood sugar is below 54 mg/dL (3 mmol/L), you have very low blood sugar, or severe hypoglycemia. This is an emergency. Get medical help right away. If you have very low  blood sugar and you cannot eat or drink, you will need to be given a hormone called glucagon. A family member or friend should learn how to check your blood sugar and how to give you glucagon. Ask your doctor if you need to have an emergency glucagon kit at home. Very low blood sugar may also need to be treated in a hospital. Follow these instructions at home: General instructions Take over-the-counter and prescription medicines only as told by your doctor. Stay aware of your blood sugar as told by your doctor. If you drink alcohol: Limit how much you have to: 0-1 drink a day for women who are not pregnant. 0-2 drinks a day for men. Know how much alcohol is in your drink. In the [place] one drink equals one 12 oz bottle of beer (355 mL), one 5 oz glass of wine (148 mL), or one 1 oz glass of hard liquor (44 mL). Be sure to eat food when you drink alcohol. Know that your body absorbs alcohol quickly. This may lead to low blood sugar later. Be sure to keep checking your blood sugar. Keep all follow-up visits. If you have diabetes:  Always have a fast-acting carb (15 grams) with you to treat low blood sugar. Follow your diabetes care plan as told by your doctor. Make sure you: Know the symptoms of low blood sugar. Check your blood sugar as often as told. Always check it before and after exercise. Always check your blood sugar before you drive. Take your medicines as told. Follow your meal plan. Eat on time. Do not skip meals. Share your diabetes care plan with: Your work or school. People you live with. Carry a card or wear jewelry that says you have diabetes. Where to find more information American Diabetes Association: www.diabetes.org Contact a doctor if: You have trouble keeping your blood sugar in your target range. You have low blood sugar often. Get help right away if: You still have symptoms after you eat or drink something that contains 15 grams of fast-acting carb, and you  cannot get your blood sugar above 70 mg/dL by following the 15:15 rule. Your blood sugar is below 54 mg/dL (3 mmol/L). You have a seizure. You faint. These symptoms may be an emergency. Get help right away. Call your local emergency services (911 in the [place] Do not wait to see if the symptoms will go away. Do not drive yourself to the hospital. Summary Hypoglycemia happens when the  level of sugar (glucose) in your blood is too low. Low blood sugar can happen to people who have diabetes and people who do not have diabetes. Low blood sugar can happen quickly, and it can be an emergency. Make sure you know the symptoms of low blood sugar and know how to treat it. Always keep a source of sugar (fast-acting carb) with you to treat low blood sugar. This information is not intended to replace advice given to you by your health care provider. Make sure you discuss any questions you have with your health care provider. Document Revised: 03/04/2020 Document Reviewed: 03/04/2020 Elsevier Patient Education  [place].

## 2022-05-18 NOTE — Chronic Care Management (AMB) (Signed)
Chronic Care Management   CCM RN Visit Note  05/18/2022 Name: Paul [name] MRN: [id number] DOB: 1950/11/03  Subjective: Paul [name] is a 72 y.o. year old male who is a primary care patient of Early, [name], NP. The patient was referred to the Chronic Care Management team for assistance with care management needs subsequent to provider initiation of CCM services and plan of care.    Today's Visit:  Engaged with patient by telephone for initial visit.     SDOH Interventions Today    Flowsheet Row Most Recent Value  SDOH Interventions   Food Insecurity Interventions Other (Comment)  [pt reported he was outreached today and given food resources]  Transportation Interventions Intervention Not Indicated  Utilities Interventions Intervention Not Indicated  Financial Strain Interventions Other (Comment)  Manufacturing engineer will be calling pt on 05/22/22]  Physical Activity Interventions Patient Refused  Stress Interventions Intervention Not Indicated  Social Connections Interventions Patient Refused         Goals Addressed             This Visit's Progress    CCM (COPD) EXPECTED OUTCOME: MONITOR, SELF-MANAGE AND REDUCE SYMTOM OF COPD       Current Barriers:  Knowledge Deficits related to COPD management Chronic Disease Management support and education needs related to COPD, action plan, falls Financial Constraints. - Education officer, museum will be working with pt Patient reports he has all medications and taking as prescribed Patient has shower seat, walker, cane, has relatives he can call on if needed Patient has had several falls this past year due to having balance issues at times  Planned Interventions: Provided patient with basic written and verbal COPD education on self care/management/and exacerbation prevention Advised patient to track and manage COPD triggers Provided instruction about proper use of medications used for management of COPD including inhalers Advised patient to self  assesses COPD action plan zone and make appointment with provider if in the yellow zone for 48 hours without improvement Advised patient to engage in light exercise as tolerated 3-5 days a week to aid in the the management of COPD Provided education about and advised patient to utilize infection prevention strategies to reduce risk of respiratory infection Discussed the importance of adequate rest and management of fatigue with COPD Screening for signs and symptoms of depression related to chronic disease state  Assessed social determinant of health barriers Reviewed safety precautions  Symptom Management: Take medications as prescribed   Attend all scheduled provider appointments Call pharmacy for medication refills 3-7 days in advance of running out of medications Attend church or other social activities Call provider office for new concerns or questions  identify and remove indoor air pollutants limit outdoor activity during cold weather listen for public air quality announcements every day do breathing exercises every day develop a rescue plan eliminate symptom triggers at home follow rescue plan if symptoms flare-up get at least 7 to 8 hours of sleep at night practice relaxation or meditation daily Look over education mailed- COPD action plan fall prevention strategies: change position slowly, use assistive device such as walker or cane (per provider recommendations) when walking, keep walkways clear, have good lighting in room. It is important to contact your provider if you have any falls, maintain muscle strength/tone by exercise per provider recommendations.  Follow Up Plan: Telephone follow up appointment with care management team member scheduled for:  07/03/22 at 130 pm       CCM (DIABETES) EXPECTED OUTCOME: MONITOR, SELF-MANAGE AND  REDUCE SYMPTOMS OF DIABETES       Current Barriers:  Knowledge Deficits related to Diabetes management Care Coordination needs related to  social work needs in a patient with Diabetes Chronic Disease Management support and education needs related to Diabetes, diet Financial Constraints. - pt states he is not always able to afford, food, states he received a call today for resources for Food banks, states he is headed to a food bank today and has needed resources. Social worker is to call pt on 05/22/22 and pharmD to call pt on 05/19/22.   Patient reports CBG is checked with continuous glucose monitor, fasting ranges 160-170 and pt states " due to my diet", random ranges 170's Patient reports he has low energy and does not exercise, has walker and cane and uses as needed  Planned Interventions: Provided education to patient about basic DM disease process; Reviewed medications with patient and discussed importance of medication adherence;        Reviewed prescribed diet with patient carbohydrate modified; Counseled on importance of regular laboratory monitoring as prescribed;        Discussed plans with patient for ongoing care management follow up and provided patient with direct contact information for care management team;      Provided patient with written educational materials related to hypo and hyperglycemia and importance of correct treatment;       Advised patient, providing education and rationale, to check cbg CGM per pt  and record        Review of patient status, including review of consultants reports, relevant laboratory and other test results, and medications completed;       Screening for signs and symptoms of depression related to chronic disease state;        Assessed social determinant of health barriers;         Symptom Management: Take medications as prescribed   Attend all scheduled provider appointments Call pharmacy for medication refills 3-7 days in advance of running out of medications Attend church or other social activities Call provider office for new concerns or questions  Work with the social worker to  address care coordination needs and will continue to work with the clinical team to address health care and disease management related needs check blood sugar at prescribed times: continuous glucose monitor per patient report  check feet daily for cuts, sores or redness enter blood sugar readings and medication or insulin into daily log take the blood sugar log to all doctor visits take the blood sugar meter to all doctor visits trim toenails straight across fill half of plate with vegetables limit fast food meals to no more than 1 per week manage portion size prepare main meal at home 3 to 5 days each week read food labels for fat, fiber, carbohydrates and portion size keep feet up while sitting Look over education mailed- hypoglycemia Social worker to call on 05/22/22 Pharmacist to call on 05/19/22  Follow Up Plan: Telephone follow up appointment with care management team member scheduled for:  07/03/22 at 130 pm       CCM (HYPERTENSION) EXPECTED OUTCOME: MONITOR, SELF-MANAGE AND REDUCE SYMPTOMS OF HYPERTENSION       Current Barriers:  Knowledge Deficits related to Hypertension management Chronic Disease Management support and education needs related to Hypertension, diet Financial Constraints.  Patient reports he has blood pressure cuff but does not monitor blood pressure  Planned Interventions: Evaluation of current treatment plan related to hypertension self management and patient's adherence  to plan as established by provider;   Reviewed prescribed diet low sodium Reviewed medications with patient and discussed importance of compliance;  Counseled on the importance of exercise goals with target of 150 minutes per week Discussed plans with patient for ongoing care management follow up and provided patient with direct contact information for care management team; Advised patient, providing education and rationale, to monitor blood pressure daily and record, calling PCP for findings  outside established parameters;  Discussed complications of poorly controlled blood pressure such as heart disease, stroke, circulatory complications, vision complications, kidney impairment, sexual dysfunction;  Screening for signs and symptoms of depression related to chronic disease state;  Assessed social determinant of health barriers;   Symptom Management: Take medications as prescribed   Attend all scheduled provider appointments Call pharmacy for medication refills 3-7 days in advance of running out of medications Attend church or other social activities Call provider office for new concerns or questions  check blood pressure weekly choose a place to take my blood pressure (home, clinic or office, retail store) write blood pressure results in a log or diary learn about high blood pressure keep a blood pressure log take blood pressure log to all doctor appointments keep all doctor appointments take medications for blood pressure exactly as prescribed report new symptoms to your doctor eat more whole grains, fruits and vegetables, lean meats and healthy fats Look over education mailed- low sodium diet  Follow Up Plan: Telephone follow up appointment with care management team member scheduled for:  07/03/22 at 130 pm          Plan:Telephone follow up appointment with care management team member scheduled for:  07/03/22 AT 130 PM  Jacqlyn Larsen [facility], BSN RN Case Manager [place] [phone]

## 2022-05-19 ENCOUNTER — Other Ambulatory Visit: Payer: Medicare Other

## 2022-05-19 DIAGNOSIS — M9903 Segmental and somatic dysfunction of lumbar region: Secondary | ICD-10-CM | POA: Diagnosis not present

## 2022-05-19 DIAGNOSIS — M5442 Lumbago with sciatica, left side: Secondary | ICD-10-CM | POA: Diagnosis not present

## 2022-05-19 NOTE — Progress Notes (Signed)
Outreach attempt for telephone appointment unsuccessful, but I was able to reach patient later to reschedule.  Appointment moved to Tuesday, 12/6 to help with medication assistance and management.  Darlina Guys, PharmD, DPLA

## 2022-05-22 ENCOUNTER — Telehealth: Payer: Self-pay

## 2022-05-22 DIAGNOSIS — M5442 Lumbago with sciatica, left side: Secondary | ICD-10-CM | POA: Diagnosis not present

## 2022-05-22 DIAGNOSIS — M9903 Segmental and somatic dysfunction of lumbar region: Secondary | ICD-10-CM | POA: Diagnosis not present

## 2022-05-22 NOTE — Patient Outreach (Signed)
  Care Coordination   05/22/2022 Name: Paul [name] MRN: [id number] DOB: 1950/12/11   Care Coordination Outreach Attempts:  An unsuccessful telephone outreach was attempted for a scheduled appointment today.  Follow Up Plan:  Additional outreach attempts will be made to offer the patient care coordination information and services.   Encounter Outcome:  No Answer   Care Coordination Interventions:  No, not indicated    Daneen Schick, BSW, CDP Social Worker, Certified Dementia Practitioner [place] Management  Care Coordination [phone]

## 2022-05-23 ENCOUNTER — Other Ambulatory Visit: Payer: Medicare Other

## 2022-05-23 ENCOUNTER — Ambulatory Visit: Payer: Medicare Other | Attending: Physician Assistant | Admitting: Physician Assistant

## 2022-05-23 ENCOUNTER — Encounter: Payer: Self-pay | Admitting: Physician Assistant

## 2022-05-23 ENCOUNTER — Telehealth: Payer: Self-pay

## 2022-05-23 VITALS — BP 132/78 | HR 73 | Ht 65.0 in | Wt 188.4 lb

## 2022-05-23 DIAGNOSIS — K7460 Unspecified cirrhosis of liver: Secondary | ICD-10-CM | POA: Diagnosis not present

## 2022-05-23 DIAGNOSIS — R0609 Other forms of dyspnea: Secondary | ICD-10-CM

## 2022-05-23 DIAGNOSIS — I10 Essential (primary) hypertension: Secondary | ICD-10-CM | POA: Diagnosis not present

## 2022-05-23 DIAGNOSIS — E785 Hyperlipidemia, unspecified: Secondary | ICD-10-CM

## 2022-05-23 DIAGNOSIS — D509 Iron deficiency anemia, unspecified: Secondary | ICD-10-CM | POA: Diagnosis not present

## 2022-05-23 DIAGNOSIS — Z951 Presence of aortocoronary bypass graft: Secondary | ICD-10-CM | POA: Diagnosis not present

## 2022-05-23 DIAGNOSIS — I2510 Atherosclerotic heart disease of native coronary artery without angina pectoris: Secondary | ICD-10-CM | POA: Diagnosis not present

## 2022-05-23 DIAGNOSIS — I25118 Atherosclerotic heart disease of native coronary artery with other forms of angina pectoris: Secondary | ICD-10-CM

## 2022-05-23 DIAGNOSIS — J449 Chronic obstructive pulmonary disease, unspecified: Secondary | ICD-10-CM | POA: Diagnosis not present

## 2022-05-23 DIAGNOSIS — I6523 Occlusion and stenosis of bilateral carotid arteries: Secondary | ICD-10-CM

## 2022-05-23 NOTE — Assessment & Plan Note (Signed)
Chronic weakness and falls noted.  Referral placed to chronic care management today.

## 2022-05-23 NOTE — Patient Instructions (Addendum)
Medication Instructions:  Your physician recommends that you continue on your current medications as directed. Please refer to the Current Medication list given to you today.  *If you need a refill on your cardiac medications before your next appointment, please call your pharmacy*   Lab Work: None ordered If you have labs (blood work) drawn today and your tests are completely normal, you will receive your results only by: [place] (if you have MyChart) OR A paper copy in the mail If you have any lab test that is abnormal or we need to change your treatment, we will call you to review the results.   Testing/Procedures: Your physician has requested that you have a carotid duplex. This test is an ultrasound of the carotid arteries in your neck. It looks at blood flow through these arteries that supply the brain with blood. Allow one hour for this exam. There are no restrictions or special instructions.    Follow-Up: At [facility], you and your health needs are our priority.  As part of our continuing mission to provide you with exceptional heart care, we have created designated Provider Care Teams.  These Care Teams include your primary Cardiologist (physician) and Advanced Practice Providers (APPs -  Physician Assistants and Nurse Practitioners) who all work together to provide you with the care you need, when you need it.  We recommend signing up for the patient portal called "MyChart".  Sign up information is provided on this After Visit Summary.  MyChart is used to connect with patients for Virtual Visits (Telemedicine).  Patients are able to view lab/test results, encounter notes, upcoming appointments, etc.  Non-urgent messages can be sent to your provider as well.   To learn more about what you can do with MyChart, go to [email or web address].    Your next appointment:   3-4 month(s)  Provider:   Peter Martinique, MD   Other Instructions Follow up with your primary  care provider regarding your fatigue [facility] is the place that we discussed to contact for healthy prepared meals [phone]

## 2022-05-23 NOTE — Assessment & Plan Note (Signed)
Chronic. Referral to chronic care management in the setting of multiple co-morbidities with concerns for food insecurity and difficult to control diabetes. Recommend continuation on current medications. Will continue to monitor.

## 2022-05-23 NOTE — Assessment & Plan Note (Signed)
Chronic kidney disease in the setting of severe cardiovascular disease and uncontrolled diabetes.  Unfortunately he is struggling to maintain his blood sugar control due to cost of both healthy foods and medications.  We will send a referral today for chronic care management to see if there are resources available that can help him with medication and food assistance.  Encouraged him to work to take his medications as prescribed to help reduce the risks of worsening chronic kidney disease and comorbid conditions.  He expressed understanding.  Will plan to follow-up in 3 months or sooner if needed.

## 2022-05-23 NOTE — Progress Notes (Signed)
   05/23/2022  Patient ID: Paul [name], male   DOB: [birth year], 72 y.o.   MRN: [id number]  Outreach attempted for scheduled telephone visit for medication management and assistance.  Patient did not answer, so I left a message for him to return my call to reschedule.  This was our 2nd scheduled appointment he did not answer for.  I will attempt to outreach once more 2/9.  Darlina Guys, PharmD, DPLA

## 2022-05-23 NOTE — Assessment & Plan Note (Signed)
Chronic weakness in the setting of multiple comorbidities and recurrent falls.  Chronic care management referral placed.

## 2022-05-23 NOTE — Progress Notes (Signed)
Office Visit    Patient Name: Paul [name] Date of Encounter: 05/23/2022  PCP:  Orma Render, NP   [place]  Cardiologist:  Peter Martinique, MD  Advanced Practice Provider:  No care team member to display Electrophysiologist:  None }  HPI    Paul [name] is a 72 y.o. male with past medical history of CAD status PCI subsequent CABG 01/25/2021 (LIMA to LAD free graft, jump off vein graft proximally, R SVG to OM, type 2 diabetes mellitus, hyperlipidemia, COPD, HTN, OSA, and mild carotid artery disease presents today for follow-up appointment.  Previously seen by Dr. Ellyn Hack with history of CAD and PCI to D1 11/2011.  He was seen 12/13/2020 by Dr. Martinique noting increasing precordial pain and exertional dyspnea.  Cardiac catheterization 12/28/2020 showed extensive LAD and circumflex disease with occluded diagonal and recommended for cardiothoracic surgery consult.  Underwent CABG x 2 on 01/25/2021 by Dr. Kipp Brood.  Started on Flomax due to difficulty voiding.  On 01/30/2021 had an episode where he fell off of a chair after coughing with a small laceration to the nose but otherwise neurologically intact.  Head CT negative for intracranial injury.  Discharged to SNF.  Seen in follow-up 03/18/2022 with caretakers.  Had a viral illness and supportive care was recommended.  He was seen by Laurann Montana, NP 05/19/2021.  He was undergoing workup with Ortho and was pending neurosurgery for back and shoulder pain.  Working with physical therapy at home at the time.  Noted some "knots" under his chest wall which will likely keloid scarring.  Gradually increasing activity with no reported symptoms.  No changes were made to his treatment regimen he was advised to follow-up in 6 months.  He contacted our office 03/21/2022 to report shortness of breath.  Reports he had issues walking and has a pretty low energy level.  He was scheduled for an office visit.  He was seen by Christen Bame,  NP 04/03/2022 for evaluation of shortness of breath and fatigue.  He could not complete his normal activities.  He endorsed exhaustion by walking to the office that day.  Additionally, activity was limited by chronic back pain.  He had mild bilateral lower extremity edema.  He felt his abdomen was more bloated as well.  He does try to limit salt and avoid sugar but is not strict about his diet.  Today, he tells me that his blood glucose was down to 81 and he felt bad.  Usually it is in the 200s and that is where he feels good.  Recent A1c 9.6.  His primary recently went up on his insulin.  He tells me that his diet consists of boiled shrimp and rice.  He does not like fast food such as McDonald[employer].  We went through his most recent chemical stress test which was negative for ischemia.  We also reviewed his echocardiogram together.  He still endorses extreme fatigue.  I explained fatigue could be multifactorial.  We reviewed most recent labs and he remains anemic and has been that way for the past year.  He states that his iron is usually low and he was on supplementation for a while.  We discussed prepackaged healthy meals like [employer] on [address]. No further cardiac workup indicated at this time.  Reports no chest pain, pressure, or tightness. No edema, orthopnea, PND. Reports no palpitations.   Past Medical History    Past Medical History:  Diagnosis Date  CAD 11/24/2011   a) Mild-to-moderate 30-40% lesions in the RCA, LAD and Circumflex. b) CULPRIT LESION: long tubular 70-80% lesion in D1 with FFR of 0.7 --> PCI w/ Xience Xpedition DES 2.5 mm x 30 mm (2.65 MM); c) Lexiscan Myoview 11/2013: No Ischemia or Infarct (Inferior Gut Attenuation) EF 63%.   Chronic fatigue syndrome 06/13/2021   Constipation by delayed colonic transit 06/13/2021   COPD (chronic obstructive pulmonary disease) ([employer])    "don't have full case of it; I'm right there at it"   Diabetes mellitus without complication  ([place])    Dysphagia    Essential hypertension 11/24/2011   Exertional dyspnea, chronic    Gout    Hyperglycemia 03/11/2019   Hyperlipidemia with target LDL less than 70 11/24/2011   Hypo-osmolality and hyponatremia 03/11/2019   Long term (current) use of insulin ([place]) 07/15/2019   Obesity (BMI 30.0-34.9)    OSA (obstructive sleep apnea), uses oxygen at home did not tolerate cpap 11/24/2011   Paresthesia of skin 02/17/2015   S/P CABG (coronary artery bypass graft)    Past Surgical History:  Procedure Laterality Date   CERVICAL SPINE SURGERY  2012   CORONARY ANGIOPLASTY WITH STENT PLACEMENT  11/23/2011   "1; first one"   CORONARY ARTERY BYPASS GRAFT N/A 01/25/2021   Procedure: CORONARY ARTERY BYPASS GRAFTING (CABG) X2, USING LEFT INTERNAL MAMMARY ARTERY AND LEFT LEG GREATER SAPHENOUS VEIN HARVESTED ENDOSCOPICALLY;  Surgeon: Lajuana Matte, MD;  Location: [place];  Service: Open Heart Surgery;  Laterality: N/A;   ENDOVEIN HARVEST OF GREATER SAPHENOUS VEIN Bilateral 01/25/2021   Procedure: ENDOVEIN HARVEST OF GREATER SAPHENOUS VEIN;  Surgeon: Lajuana Matte, MD;  Location: [place];  Service: Open Heart Surgery;  Laterality: Bilateral;   LEFT HEART CATH AND CORONARY ANGIOGRAPHY N/A 12/28/2020   Procedure: LEFT HEART CATH AND CORONARY ANGIOGRAPHY;  Surgeon: Leonie Man, MD;  Location: [place] [facility];  Service: Cardiovascular;  Laterality: N/A;   LEFT HEART CATHETERIZATION WITH CORONARY ANGIOGRAM N/A 11/23/2011   Procedure: LEFT HEART CATHETERIZATION WITH CORONARY ANGIOGRAM;  Surgeon: Leonie Man, MD;  Location: [facility] CATH LAB;  Service: Cardiovascular;  Laterality: N/A;   TEE WITHOUT CARDIOVERSION N/A 01/25/2021   Procedure: TRANSESOPHAGEAL ECHOCARDIOGRAM (TEE);  Surgeon: Lajuana Matte, MD;  Location: [place];  Service: Open Heart Surgery;  Laterality: N/A;    Allergies  Allergies  Allergen Reactions   Morphine And Related Itching    Possible itching due to morphine  01/26/21   Cortisone    Other Other (See Comments)    Cats- eyes burn     EKGs/Labs/Other Studies Reviewed:   The following studies were reviewed today:   LHC 12/28/20   Prox LAD to Mid LAD lesion is 65% stenosed with 30% stenosed side branch in 1st Diag. (RFR 0.90-0.91)   1st Diag lesion is 100% stenosed. (In-stent Re-stenosis/occlusion)   Mid LAD lesion is 40% stenosed.   Mid LAD to Dist LAD lesion is 75% stenosed. (RFR 0.74)   Ost Cx to Mid Cx lesion is 55% stenosed with 20% stenosed side branch in 1st Mrg.   Mid Cx lesion is 90% stenosed.   Prox RCA lesion is 40% stenosed.   -------------------------------------------   The left ventricular systolic function is normal.  The left ventricular ejection fraction is 50-55% by visual estimate.   LV end diastolic pressure is normal.   There is no aortic valve stenosis.   SUMMARY Severe 3 Vessel CAD:  100% ISR of Major  D1 stent,  focal 65% prox LAD (@ D1) lesion - FFR 0.91 followed by 40% LAD @ SP1 then long 70% mid LAD (FFR 0.74 - SIGNIFICANT).  prox-mid LCx diffuse 55% with focal 90% just after AVG branch in LCx-Lateral OM2. Moderate Prox-mid RCA 35-40% @ bend  Poor LV Gram image, but appears to have preserved LVEF with normal LVEDP (2 D Echo ordered).      RECOMMENDATIONS With extensive LAD and LCx disease and occluded diagonal, best treatment option would likely be CVTS.  He has been on Plavix since his PCI. CVTS consult placed, 2D echo ordered, and Plavix discontinued. Anticipate outpatient CVTS clinic visit with staged CABG after Plavix washout -> otherwise would need to consider extensive PCI of the LAD with focal PCI of LCx and attempted recanalization of occluded D1 stent Will need to continue aggressive risk factor modification/Guideline Directed Medical Management   Echo 12/28/20 1. Left ventricular ejection fraction, by estimation, is 60 to 65%. The  left ventricle has normal function. The left ventricle has no  regional  wall motion abnormalities. Left ventricular diastolic parameters are  consistent with Grade I diastolic  dysfunction (impaired relaxation).   2. Right ventricular systolic function is normal. The right ventricular  size is normal.   3. The mitral valve is normal in structure. No evidence of mitral valve  regurgitation. No evidence of mitral stenosis.   4. The aortic valve is normal in structure. Aortic valve regurgitation is  not visualized. No aortic stenosis is present.   5. The inferior vena cava is normal in size with greater than 50%  respiratory variability, suggesting right atrial pressure of 3 mmHg.   VAS Pre CABG Carotid and Extremities 01/21/21 Right Carotid: Velocities in the right ICA are consistent with a 1-39%  stenosis.   Left Carotid: Velocities in the left ICA are consistent with a 1-39%  stenosis.  Vertebrals: Bilateral vertebral arteries demonstrate antegrade flow.   Right ABI: Resting right ankle-brachial index is within normal range. No  evidence of significant right lower extremity arterial disease.  Left ABI: Resting left ankle-brachial index is within normal range. No  evidence of significant left lower extremity arterial disease.  Right Upper Extremity: Doppler waveforms decrease >50% with right radial  compression. Doppler waveforms remain within normal limits with right  ulnar compression.  Left Upper Extremity: Doppler waveform obliterate with left radial  compression. Doppler waveforms remain within normal limits with left ulnar  compression.    EKG:  EKG is  ordered today.  The ekg ordered today demonstrates NSR, T wave abnormality (old), rate 73 bpm  Recent Labs: 10/28/2021: Magnesium 1.9; TSH 2.330 03/08/2022: ALT 30; Hemoglobin 11.6; Platelet Count 152 05/04/2022: BUN 8; Creatinine, Ser 0.95; Potassium 4.4; Sodium 139  Recent Lipid Panel    Component Value Date/Time   CHOL 70 (L) 06/14/2021 1509   TRIG 149 06/14/2021 1509   HDL 21  (L) 06/14/2021 1509   CHOLHDL 3.3 06/14/2021 1509   CHOLHDL 6.6 11/23/2011 0846   VLDL 29 11/23/2011 0846   LDLCALC 23 06/14/2021 1509    Home Medications   Current Meds  Medication Sig   acetaminophen (TYLENOL) 500 MG tablet Take 1,500-2,000 mg by mouth daily.   albuterol (VENTOLIN HFA) 108 (90 Base) MCG/ACT inhaler Inhale 2 puffs into the lungs every 4 (four) hours as needed. For breathing.   allopurinol (ZYLOPRIM) 100 MG tablet Take 1 tablet (100 mg total) by mouth daily.   AMBULATORY NON FORMULARY MEDICATION Medical alert  device as covered by insurance for frequent falls and weakness.   amLODipine (NORVASC) 5 MG tablet Take 1 tablet (5 mg total) by mouth daily.   gabapentin (NEURONTIN) 300 MG capsule Take 2 capsules (600 mg total) by mouth 3 (three) times daily. For back and foot pain.   glucose blood (ONETOUCH VERIO) test strip Use to check blood glucose 3 times daily   HYDROcodone-acetaminophen (NORCO/VICODIN) 5-325 MG tablet Take 1-2 tablets by mouth in the morning, 1 tablet at noon, and at 1 tablet at bedtime.   insulin glargine, 1 Unit Dial, (TOUJEO SOLOSTAR) 300 UNIT/ML Solostar Pen Inject 40 Units into the skin daily.   insulin lispro (HUMALOG KWIKPEN) 200 UNIT/ML KwikPen INJECT 14 UNITS UNDER THE SKIN EVERY MORNING, 14 UNITS WITH LUNCH AND 14 UNITS WITH SUPPER, DAILY   Insulin Pen Needle 32G X 4 MM MISC Use daily with insulin as directed   Iron, Ferrous Sulfate, 325 (65 Fe) MG TABS Take 1 tablet (325 mg) by mouth daily.   metFORMIN (GLUCOPHAGE-XR) 500 MG 24 hr tablet Take 2 tablets (1,000 mg total) by mouth 2 (two) times daily.   metoprolol tartrate (LOPRESSOR) 25 MG tablet Take 1 tablet (25 mg total) by mouth 2 (two) times daily.   pantoprazole (PROTONIX) 40 MG tablet Take 1 tablet (40 mg total) by mouth daily.   potassium chloride (KLOR-CON M) 10 MEQ tablet Take 1 tablet (10 mEq total) by mouth daily.   Tetrahydrozoline HCl (VISINE EXTRA OP) Place 1 drop into both eyes as  needed (itching).   umeclidinium-vilanterol (ANORO ELLIPTA) 62.5-25 MCG/ACT AEPB Inhale 1 puff into the lungs daily.   zolpidem (AMBIEN) 10 MG tablet Take 1 tablet (10 mg total) by mouth at bedtime as needed for sleep     Review of Systems      All other systems reviewed and are otherwise negative except as noted above.  Physical Exam    VS:  BP 132/78   Pulse 73   Ht '5\' 5"'$  (1.651 m)   Wt 188 lb 6.4 oz (85.5 kg)   SpO2 96%   BMI 31.35 kg/m  , BMI Body mass index is 31.35 kg/m.  Wt Readings from Last 3 Encounters:  05/23/22 188 lb 6.4 oz (85.5 kg)  05/11/22 190 lb (86.2 kg)  04/20/22 187 lb 6.4 oz (85 kg)     GEN: Well nourished, well developed, in no acute distress. HEENT: normal. Neck: Supple, no JVD, carotid bruits, or masses. Cardiac: RRR, no murmurs, rubs, or gallops. No clubbing, cyanosis, edema.  Radials/PT 2+ and equal bilaterally.  Respiratory:  Respirations regular and unlabored, clear to auscultation bilaterally. GI: Soft, nontender, nondistended. MS: No deformity or atrophy. Skin: Warm and dry, no rash. Neuro:  Strength and sensation are intact. Psych: Normal affect.  Assessment & Plan    CAD status post PCI and ultimately CABG 12/2020 -no chest pain, but does have SOB -reviewed echo and recent lexiscan myoview -continue current medication regimen -ASA was discontinued  DOE -he is still having some SOB, struggle to get into the building today -low risk stress test and echo unremarkable -continue workup with PCP  Hyperlipidemia -LDL 23 -will be due to repeat labs at next visit  Essential hypertension -well controlled  today -Continue Lasix 20 mg daily, Lopressor 25 mg twice daily  Bilateral carotid artery stenosis -update [place]   Fatigue -he is still feeling tired and this has been going on for the past few months  DM Type 2 -recently increased  his insulin -working with PCP to improve A1C  8. Anemia -iron was low and he was taking iron pills  but had stopped taking those -follow-up with PCP -could be contributing to his fatigue and SOB       Disposition: Follow up 3-4 month with Peter Martinique, MD or APP.  Signed, Elgie Collard, PA-C 05/23/2022, 4:56 PM [facility]

## 2022-05-23 NOTE — Assessment & Plan Note (Signed)
Referral to chronic care management for medication and resource assistance. No changes to the plan of care at this time.

## 2022-05-23 NOTE — Assessment & Plan Note (Signed)
Chronic.  Blood sugars are not well-controlled at this time.  Patient has been limiting the amount of insulin he is using due to concerns with increased cost and difficulty affording all medications.  Unfortunately this appears to be resulting in blood sugar levels that are outside of the expected limits.  Will send referral to chronic care management today for evaluation and discussion of resources that may be available to help the patient with medications and healthy food options.  Will continue to monitor.  Plate and look for resources for the patient as well.  He has been encouraged to take his medication as prescribed and to notify us if he begins to run low and medication is not covered by insurance for cost would create a financial burden.  We can work to see if samples can be obtained.

## 2022-05-23 NOTE — Assessment & Plan Note (Signed)
Chronic iron deficiency anemia.  It is suspected that there is some form of GI blood loss as there are no other indications for blood loss that have been found.  Colonoscopy has been recommended however the patient does express concerns about cost of this procedure.  Given the significance of his anemia I do feel that this procedure is necessary, however I understand the concerns he is having.  We will send a referral to chronic care management to see if there are any initiatives that are available to with the cost of a diagnostic colonoscopy.  We will continue to monitor closely.  He is aware of emergency symptoms that would warrant immediate evaluation.

## 2022-05-23 NOTE — Assessment & Plan Note (Signed)
Chronic care management referral sent today for patient assistance with medication and resources.

## 2022-05-23 NOTE — Assessment & Plan Note (Signed)
>>  ASSESSMENT AND PLAN FOR WEAK WRITTEN ON 05/23/2022  7:12 PM BY Imran Nuon E, NP  Chronic weakness in the setting of multiple comorbidities and recurrent falls.  Chronic care management referral placed.

## 2022-05-23 NOTE — Assessment & Plan Note (Signed)
>>  ASSESSMENT AND PLAN FOR WEAKNESS OF BOTH LOWER EXTREMITIES WRITTEN ON 10/09/2022  8:14 PM BY Teyanna Thielman E, NP  >>ASSESSMENT AND PLAN FOR WEAK WRITTEN ON 05/23/2022  7:12 PM BY Jefry Lesinski E, NP  Chronic weakness in the setting of multiple comorbidities and recurrent falls.  Chronic care management referral placed.

## 2022-05-23 NOTE — Assessment & Plan Note (Signed)
Chronic. Not checking BP at home routinely. No alarm symptoms are present. Labs collected at last visit and reviewed. Will send refill of amlodipine for patient today. Will also send referral to chronic care management for chronic conditions. Plan to follow-up in 3 months or sooner if needed.

## 2022-05-23 NOTE — Progress Notes (Unsigned)
   05/23/2022  Patient ID: Paul [name], male   DOB: 1950/06/28, 72 y.o.   MRN: [id number]  Attempted outreach for scheduled telephone visit to assist patient with medication management and assistance.  This is the 2nd appointment we have had scheduled that patient has not answered for.  Left message for him to call back and reschedule; otherwise, will make final outreach attempt in 2 days.  Darlina Guys, PharmD, DPLA

## 2022-05-24 DIAGNOSIS — M5442 Lumbago with sciatica, left side: Secondary | ICD-10-CM | POA: Diagnosis not present

## 2022-05-24 DIAGNOSIS — M9903 Segmental and somatic dysfunction of lumbar region: Secondary | ICD-10-CM | POA: Diagnosis not present

## 2022-05-25 ENCOUNTER — Ambulatory Visit (HOSPITAL_COMMUNITY)
Admission: RE | Admit: 2022-05-25 | Discharge: 2022-05-25 | Disposition: A | Discharge status: Home / Self Care | Payer: Medicare Other | Source: Ambulatory Visit | Attending: Physician Assistant | Admitting: Physician Assistant

## 2022-05-25 DIAGNOSIS — I6523 Occlusion and stenosis of bilateral carotid arteries: Secondary | ICD-10-CM | POA: Insufficient documentation

## 2022-05-26 ENCOUNTER — Other Ambulatory Visit: Payer: Self-pay | Admitting: *Deleted

## 2022-05-26 ENCOUNTER — Telehealth: Payer: Self-pay | Admitting: Nurse Practitioner

## 2022-05-26 ENCOUNTER — Telehealth: Payer: Self-pay | Admitting: *Deleted

## 2022-05-26 DIAGNOSIS — E785 Hyperlipidemia, unspecified: Secondary | ICD-10-CM

## 2022-05-26 DIAGNOSIS — I25118 Atherosclerotic heart disease of native coronary artery with other forms of angina pectoris: Secondary | ICD-10-CM

## 2022-05-26 DIAGNOSIS — I10 Essential (primary) hypertension: Secondary | ICD-10-CM

## 2022-05-26 NOTE — Telephone Encounter (Signed)
Spoke with patient and provided him with message below per Dr Martinique and Johann Capers. He states his understanding and will come in on 05/30/22 to have labs drawn.

## 2022-05-26 NOTE — Telephone Encounter (Signed)
P.A. GABAPENTIN done & approved til 05/25/23

## 2022-05-26 NOTE — Telephone Encounter (Signed)
-----   Message from Elgie Collard, [place] sent at 05/24/2022  5:12 PM EST ----- Janella Rogala,   Can we order some routine labs on him-CBC, CMP, TSH. (I think he had recent lipid panel).   If normal, we might need to order a cath.  Thanks!  Elgie Collard, PA-C  ----- Message ----- From: Martinique, [name], MD Sent: 05/24/2022   8:30 AM EST To: Elgie Collard, PA-C  I would first check labs to make sure there isn't a metabolic cause for his symptoms- chemistries, TSH, CBC. Also check lipids if not done recently. If labs look ok then I think it would be reasonable to cath him to make sure grafts are functioning well.   PJ ----- Message ----- From: Miguel Aschoff Sent: 05/23/2022   5:04 PM EST To: [name] Martinique, MD  This patient is still experiencing extreme fatigue and exercise intolerance.  I reviewed his most recent echocardiogram and stress test.  Both were negative for ischemia.  I know that stress test can be wrong about 20% of the time.  Do you think that further ischemic workup is indicated?  His coronary history is extensive with both stents and CABG back in 2022.  Just worried that we are missing something.  Thanks.  Elgie Collard, PA-C

## 2022-05-30 ENCOUNTER — Ambulatory Visit: Payer: Medicare Other | Attending: Physician Assistant

## 2022-05-30 DIAGNOSIS — M5442 Lumbago with sciatica, left side: Secondary | ICD-10-CM | POA: Diagnosis not present

## 2022-05-30 DIAGNOSIS — I10 Essential (primary) hypertension: Secondary | ICD-10-CM

## 2022-05-30 DIAGNOSIS — I25118 Atherosclerotic heart disease of native coronary artery with other forms of angina pectoris: Secondary | ICD-10-CM | POA: Diagnosis not present

## 2022-05-30 DIAGNOSIS — M9903 Segmental and somatic dysfunction of lumbar region: Secondary | ICD-10-CM | POA: Diagnosis not present

## 2022-05-30 DIAGNOSIS — E785 Hyperlipidemia, unspecified: Secondary | ICD-10-CM

## 2022-05-31 DIAGNOSIS — M5442 Lumbago with sciatica, left side: Secondary | ICD-10-CM | POA: Diagnosis not present

## 2022-05-31 DIAGNOSIS — M9903 Segmental and somatic dysfunction of lumbar region: Secondary | ICD-10-CM | POA: Diagnosis not present

## 2022-05-31 LAB — COMPREHENSIVE METABOLIC PANEL
ALT: 27 IU/L (ref 0–44)
AST: 39 IU/L (ref 0–40)
Albumin/Globulin Ratio: 1.5 (ref 1.2–2.2)
Albumin: 4.4 g/dL (ref 3.8–4.8)
Alkaline Phosphatase: 125 IU/L — ABNORMAL HIGH (ref 44–121)
BUN/Creatinine Ratio: 15 (ref 10–24)
BUN: 14 mg/dL (ref 8–27)
Bilirubin Total: 0.6 mg/dL (ref 0.0–1.2)
CO2: 18 mmol/L — ABNORMAL LOW (ref 20–29)
Calcium: 9.5 mg/dL (ref 8.6–10.2)
Chloride: 103 mmol/L (ref 96–106)
Creatinine, Ser: 0.95 mg/dL (ref 0.76–1.27)
Globulin, Total: 2.9 g/dL (ref 1.5–4.5)
Glucose: 142 mg/dL — ABNORMAL HIGH (ref 70–99)
Potassium: 4.2 mmol/L (ref 3.5–5.2)
Sodium: 139 mmol/L (ref 134–144)
Total Protein: 7.3 g/dL (ref 6.0–8.5)
eGFR: 85 mL/min/{1.73_m2} (ref >59)

## 2022-05-31 LAB — LIPID PANEL
Chol/HDL Ratio: 5.3 ratio — ABNORMAL HIGH (ref 0.0–5.0)
Cholesterol, Total: 143 mg/dL (ref 100–199)
HDL: 27 mg/dL — ABNORMAL LOW (ref >39)
LDL Chol Calc (NIH): 85 mg/dL (ref 0–99)
Triglycerides: 176 mg/dL — ABNORMAL HIGH (ref 0–149)
VLDL Cholesterol Cal: 31 mg/dL (ref 5–40)

## 2022-05-31 LAB — CBC
Hematocrit: 40.9 % (ref 37.5–51.0)
Hemoglobin: 14.1 g/dL (ref 13.0–17.7)
MCH: 31 pg (ref 26.6–33.0)
MCHC: 34.5 g/dL (ref 31.5–35.7)
MCV: 90 fL (ref 79–97)
Platelets: 138 10*3/uL — ABNORMAL LOW (ref 150–450)
RBC: 4.55 x10E6/uL (ref 4.14–5.80)
RDW: 14.3 % (ref 11.6–15.4)
WBC: 6.4 10*3/uL (ref 3.4–10.8)

## 2022-05-31 LAB — TSH: TSH: 1.57 u[IU]/mL (ref 0.450–4.500)

## 2022-06-08 ENCOUNTER — Other Ambulatory Visit: Payer: Self-pay

## 2022-06-08 ENCOUNTER — Inpatient Hospital Stay (HOSPITAL_BASED_OUTPATIENT_CLINIC_OR_DEPARTMENT_OTHER): Payer: Medicare Other | Admitting: Hematology and Oncology

## 2022-06-08 ENCOUNTER — Other Ambulatory Visit: Payer: Self-pay | Admitting: Hematology and Oncology

## 2022-06-08 ENCOUNTER — Inpatient Hospital Stay: Payer: Medicare Other | Attending: Hematology and Oncology

## 2022-06-08 VITALS — BP 151/75 | HR 70 | Temp 97.4°F | Resp 15 | Wt 189.8 lb

## 2022-06-08 DIAGNOSIS — M6281 Muscle weakness (generalized): Secondary | ICD-10-CM | POA: Insufficient documentation

## 2022-06-08 DIAGNOSIS — M549 Dorsalgia, unspecified: Secondary | ICD-10-CM | POA: Diagnosis not present

## 2022-06-08 DIAGNOSIS — M5442 Lumbago with sciatica, left side: Secondary | ICD-10-CM | POA: Diagnosis not present

## 2022-06-08 DIAGNOSIS — Z87891 Personal history of nicotine dependence: Secondary | ICD-10-CM | POA: Diagnosis not present

## 2022-06-08 DIAGNOSIS — M9903 Segmental and somatic dysfunction of lumbar region: Secondary | ICD-10-CM | POA: Diagnosis not present

## 2022-06-08 DIAGNOSIS — D500 Iron deficiency anemia secondary to blood loss (chronic): Secondary | ICD-10-CM

## 2022-06-08 DIAGNOSIS — M25519 Pain in unspecified shoulder: Secondary | ICD-10-CM | POA: Diagnosis not present

## 2022-06-08 DIAGNOSIS — D509 Iron deficiency anemia, unspecified: Secondary | ICD-10-CM | POA: Diagnosis not present

## 2022-06-08 LAB — CBC WITH DIFFERENTIAL (CANCER CENTER ONLY)
Abs Immature Granulocytes: 0.02 10*3/uL (ref 0.00–0.07)
Basophils Absolute: 0.1 10*3/uL (ref 0.0–0.1)
Basophils Relative: 1 %
Eosinophils Absolute: 0.2 10*3/uL (ref 0.0–0.5)
Eosinophils Relative: 4 %
HCT: 39.6 % (ref 39.0–52.0)
Hemoglobin: 13.4 g/dL (ref 13.0–17.0)
Immature Granulocytes: 0 %
Lymphocytes Relative: 26 %
Lymphs Abs: 1.7 10*3/uL (ref 0.7–4.0)
MCH: 31 pg (ref 26.0–34.0)
MCHC: 33.8 g/dL (ref 30.0–36.0)
MCV: 91.7 fL (ref 80.0–100.0)
Monocytes Absolute: 0.4 10*3/uL (ref 0.1–1.0)
Monocytes Relative: 6 %
Neutro Abs: 3.9 10*3/uL (ref 1.7–7.7)
Neutrophils Relative %: 63 %
Platelet Count: 149 10*3/uL — ABNORMAL LOW (ref 150–400)
RBC: 4.32 MIL/uL (ref 4.22–5.81)
RDW: 14 % (ref 11.5–15.5)
WBC Count: 6.2 10*3/uL (ref 4.0–10.5)
nRBC: 0 % (ref 0.0–0.2)

## 2022-06-08 LAB — RETIC PANEL
Immature Retic Fract: 15.9 % (ref 2.3–15.9)
RBC.: 4.34 MIL/uL (ref 4.22–5.81)
Retic Count, Absolute: 77.7 10*3/uL (ref 19.0–186.0)
Retic Ct Pct: 1.8 % (ref 0.4–3.1)
Reticulocyte Hemoglobin: 33.4 pg (ref >27.9)

## 2022-06-08 LAB — IRON AND IRON BINDING CAPACITY (CC-WL,HP ONLY)
Iron: 47 ug/dL (ref 45–182)
Saturation Ratios: 11 % — ABNORMAL LOW (ref 17.9–39.5)
TIBC: 426 ug/dL (ref 250–450)
UIBC: 379 ug/dL — ABNORMAL HIGH (ref 117–376)

## 2022-06-08 LAB — CMP (CANCER CENTER ONLY)
ALT: 23 U/L (ref 0–44)
AST: 39 U/L (ref 15–41)
Albumin: 4.2 g/dL (ref 3.5–5.0)
Alkaline Phosphatase: 105 U/L (ref 38–126)
Anion gap: 9 (ref 5–15)
BUN: 15 mg/dL (ref 8–23)
CO2: 22 mmol/L (ref 22–32)
Calcium: 9.7 mg/dL (ref 8.9–10.3)
Chloride: 106 mmol/L (ref 98–111)
Creatinine: 0.91 mg/dL (ref 0.61–1.24)
GFR, Estimated: >60 mL/min (ref >60)
Glucose, Bld: 184 mg/dL — ABNORMAL HIGH (ref 70–99)
Potassium: 4.5 mmol/L (ref 3.5–5.1)
Sodium: 137 mmol/L (ref 135–145)
Total Bilirubin: 0.6 mg/dL (ref 0.3–1.2)
Total Protein: 7.4 g/dL (ref 6.5–8.1)

## 2022-06-08 LAB — FERRITIN: Ferritin: 36 ng/mL (ref 24–336)

## 2022-06-08 NOTE — Progress Notes (Signed)
[place] [phone]   [phone]  PROGRESS NOTE  Patient Care Team: Early, [name], NP as PCP - General (Nurse Practitioner) Martinique, [name], MD as PCP - Cardiology (Cardiology) Daneen Schick as [place] Management Kassie Mends, RN as [place] History # Iron Deficiency Anemia of Unclear Etiology  03/02/2022: WBC 6.7, Hgb 12.0, Plt 187, ANC 4.2, Cr 1.01 03/08/2022: establish care with Dr. Lorenso Courier   Interval History:  Paul [name] 72 y.o. male with medical history significant for iron deficiency anemia of unclear etiology who presents for a follow up visit. The patient's last visit was on 03/08/2022 at which time he established care. In the interim since the last visit he was started on p.o. iron therapy but was discontinued recently.  On exam today Paul [name] reports that he has been "okay".  He reports that he "does not feel good at all".  He notes that his energy is currently about a 6 out of 10 and he does not feel like doing anything.  He feels unmotivated.  He notes he has back and shoulder pain.  He also has leg weakness and he feels like his balance is off.  He is had no trips or falls however.  He reports that he wants to ride his motorcycle and be more active but he just feels like his energy reserves are too poor to do these things.  He reports that he was taking his iron pills and they were causing "messy dark stools" but he was told to stop these by his PCP.  He reports that he is not having any difficulty with the iron pills other than the dark stools.  He reports that after he stopped the pills the dark tarry stools stopped as well.  He reports that he also takes a multivitamin.  He is easily short of breath on exertion.  He otherwise denies any fevers, chills, sweats, nausea, vomiting or diarrhea.  A full 10 point ROS was otherwise negative.  MEDICAL HISTORY:   Past Medical History:  Diagnosis Date   CAD 11/24/2011   a) Mild-to-moderate 30-40% lesions in the RCA, LAD and Circumflex. b) CULPRIT LESION: long tubular 70-80% lesion in D1 with FFR of 0.7 --> PCI w/ Xience Xpedition DES 2.5 mm x 30 mm (2.65 MM); c) Lexiscan Myoview 11/2013: No Ischemia or Infarct (Inferior Gut Attenuation) EF 63%.   Chronic fatigue syndrome 06/13/2021   Constipation by delayed colonic transit 06/13/2021   COPD (chronic obstructive pulmonary disease) ([place])    "don't have full case of it; I'm right there at it"   Diabetes mellitus without complication ([place])    Dysphagia    Essential hypertension 11/24/2011   Exertional dyspnea, chronic    Gout    Hyperglycemia 03/11/2019   Hyperlipidemia with target LDL less than 70 11/24/2011   Hypo-osmolality and hyponatremia 03/11/2019   Long term (current) use of insulin ([place]) 07/15/2019   Obesity (BMI 30.0-34.9)    OSA (obstructive sleep apnea), uses oxygen at home did not tolerate cpap 11/24/2011   Paresthesia of skin 02/17/2015   S/P CABG (coronary artery bypass graft)     SURGICAL HISTORY: Past Surgical History:  Procedure Laterality Date   CERVICAL SPINE SURGERY  2012   CORONARY ANGIOPLASTY WITH STENT PLACEMENT  11/23/2011   "1; first one"   CORONARY ARTERY BYPASS GRAFT N/A 01/25/2021   Procedure: CORONARY ARTERY BYPASS GRAFTING (CABG) X2, USING LEFT INTERNAL  MAMMARY ARTERY AND LEFT LEG GREATER SAPHENOUS VEIN HARVESTED ENDOSCOPICALLY;  Surgeon: Lajuana Matte, MD;  Location: [facility];  Service: Open Heart Surgery;  Laterality: N/A;   ENDOVEIN HARVEST OF GREATER SAPHENOUS VEIN Bilateral 01/25/2021   Procedure: ENDOVEIN HARVEST OF GREATER SAPHENOUS VEIN;  Surgeon: Lajuana Matte, MD;  Location: [facility];  Service: Open Heart Surgery;  Laterality: Bilateral;   LEFT HEART CATH AND CORONARY ANGIOGRAPHY N/A 12/28/2020   Procedure: LEFT HEART CATH AND CORONARY ANGIOGRAPHY;  Surgeon: Leonie Man, MD;  Location: [facility];  Service: Cardiovascular;  Laterality: N/A;   LEFT HEART CATHETERIZATION WITH CORONARY ANGIOGRAM N/A 11/23/2011   Procedure: LEFT HEART CATHETERIZATION WITH CORONARY ANGIOGRAM;  Surgeon: Leonie Man, MD;  Location: [facility];  Service: Cardiovascular;  Laterality: N/A;   TEE WITHOUT CARDIOVERSION N/A 01/25/2021   Procedure: TRANSESOPHAGEAL ECHOCARDIOGRAM (TEE);  Surgeon: Lajuana Matte, MD;  Location: [facility];  Service: Open Heart Surgery;  Laterality: N/A;    SOCIAL HISTORY: Social History   Socioeconomic History   Marital status: Divorced    Spouse name: Not on file   Number of children: 3   Years of education: 11th   Highest education level: 11th grade  Occupational History   Not on file  Tobacco Use   Smoking status: Former    Packs/day: 1.00    Years: 40.00    Total pack years: 40.00    Types: Cigarettes    Quit date: 06/10/2016    Years since quitting: 5.9    Passive exposure: Past   Smokeless tobacco: Never   Tobacco comments:    02/18/14- smokes occ cig maybe 3 x per wk  Vaping Use   Vaping Use: Never used  Substance and Sexual Activity   Alcohol use: Yes    Alcohol/week: 0.0 standard drinks of alcohol    Comment: social 2-3 drink a week   Drug use: No   Sexual activity: Not Currently    Partners: Female  Other Topics Concern   Not on file  Social History Narrative   He is a 72 y.o. divorced father of 10, grandfather 1.   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   He quit smoking in August 2013, after smoking a pack a day for roughly 40 years.   He drinks socially 2-3 drinks a week only.   He does not get routine exercise, mostly due to 2 fatigue and dyspnea. Otherwise been relatively sedentary.   Social Determinants of Health   Financial Resource Strain: Medium Risk (05/18/2022)   Overall Financial  Resource Strain (CARDIA)    Difficulty of Paying Living Expenses: Somewhat hard  Food Insecurity: Food Insecurity Present (05/18/2022)   Hunger Vital Sign    Worried About Running Out of Food in the Last Year: Often true    Ran Out of Food in the Last Year: Often true  Transportation Needs: No Transportation Needs (05/18/2022)   PRAPARE - Hydrologist (Medical): No    Lack of Transportation (Non-Medical): No  Physical Activity: Inactive (05/18/2022)   Exercise Vital Sign    Days of Exercise per Week: 0 days    Minutes of Exercise per Session: 0 min  Stress: No Stress Concern Present (05/18/2022)   [place]    Feeling of Stress :  Not at all  Social Connections: Socially Isolated (05/18/2022)   Social Connection and Isolation Panel [NHANES]    Frequency of Communication with Friends and Family: More than three times a week    Frequency of Social Gatherings with Friends and Family: Once a week    Attends Religious Services: Never    Marine scientist or Organizations: No    Attends Archivist Meetings: Never    Marital Status: Divorced  Human resources officer Violence: Not At Risk (05/18/2022)   Humiliation, Afraid, Rape, and Kick questionnaire    Fear of Current or Ex-Partner: No    Emotionally Abused: No    Physically Abused: No    Sexually Abused: No    FAMILY HISTORY: Family History  Problem Relation Age of Onset   Heart disease Sister    Heart attack Brother    Emphysema Father        smoked   Aneurysm Father     ALLERGIES:  is allergic to morphine and related, cortisone, and other.  MEDICATIONS:  Current Outpatient Medications  Medication Sig Dispense Refill   acetaminophen (TYLENOL) 500 MG tablet Take 1,500-2,000 mg by mouth daily.     albuterol (VENTOLIN HFA) 108 (90 Base) MCG/ACT inhaler Inhale 2 puffs into the lungs every 4 (four) hours as needed. For breathing. 18 g 3    allopurinol (ZYLOPRIM) 100 MG tablet Take 1 tablet (100 mg total) by mouth daily. 90 tablet 3   AMBULATORY NON FORMULARY MEDICATION Medical alert device as covered by insurance for frequent falls and weakness. 1 each 0   amLODipine (NORVASC) 5 MG tablet Take 1 tablet (5 mg total) by mouth daily. 90 tablet 3   gabapentin (NEURONTIN) 300 MG capsule Take 2 capsules (600 mg total) by mouth 3 (three) times daily. For back and foot pain. 540 capsule 3   [START ON 07/12/2022] HYDROcodone-acetaminophen (NORCO/VICODIN) 5-325 MG tablet Take 1-2 tablets by mouth in the morning, 1 tablet at noon, and 1 tablet at bedtime. 120 tablet 0   insulin glargine, 1 Unit Dial, (TOUJEO SOLOSTAR) 300 UNIT/ML Solostar Pen Inject 40 Units into the skin daily. 18 mL 3   insulin lispro (HUMALOG KWIKPEN) 200 UNIT/ML KwikPen INJECT 14 UNITS UNDER THE SKIN EVERY MORNING, 14 UNITS WITH LUNCH AND 14 UNITS WITH SUPPER, DAILY 12 mL 5   Insulin Pen Needle 32G X 4 MM MISC Use daily with insulin as directed 100 each 3   metFORMIN (GLUCOPHAGE-XR) 500 MG 24 hr tablet Take 2 tablets (1,000 mg total) by mouth 2 (two) times daily. 360 tablet 3   metoprolol tartrate (LOPRESSOR) 25 MG tablet Take 1 tablet (25 mg total) by mouth 2 (two) times daily. 180 tablet 3   pantoprazole (PROTONIX) 40 MG tablet Take 1 tablet (40 mg total) by mouth daily. 90 tablet 3   Tetrahydrozoline HCl (VISINE EXTRA OP) Place 1 drop into both eyes as needed (itching).     zolpidem (AMBIEN) 10 MG tablet Take 1 tablet (10 mg total) by mouth at bedtime as needed for sleep 90 tablet 3   aspirin 81 MG EC tablet Take 1 tablet (81 mg total) by mouth daily. (Patient not taking: Reported on 05/18/2022) 30 tablet 0   furosemide (LASIX) 20 MG tablet Take 1 tablet (20 mg total) by mouth daily. (Patient not taking: Reported on 06/08/2022) 90 tablet 3   glucose blood (ONETOUCH VERIO) test strip Use to check blood glucose 3 times daily 300 each 3   HYDROcodone-acetaminophen  (  NORCO/VICODIN) 5-325 MG tablet Take 1 tablet by mouth every 8 (eight) hours as needed fo rpain (Patient not taking: Reported on 05/18/2022) 90 tablet 0   HYDROcodone-acetaminophen (NORCO/VICODIN) 5-325 MG tablet Take 1-2 tablets by mouth in the morning, 1 tablet at noon, and at 1 tablet at bedtime. (Patient not taking: Reported on 06/08/2022) 120 tablet 0   [START ON 06/14/2022] HYDROcodone-acetaminophen (NORCO/VICODIN) 5-325 MG tablet Take 1-2 tablets by mouth in the morning, 1 tablet at noon, and 1 tablet at bedtime. (Patient not taking: Reported on 05/18/2022) 120 tablet 0   Iron, Ferrous Sulfate, 325 (65 Fe) MG TABS Take 1 tablet (325 mg) by mouth daily. (Patient not taking: Reported on 06/08/2022) 30 tablet 3   potassium chloride (KLOR-CON M) 10 MEQ tablet Take 1 tablet (10 mEq total) by mouth daily. (Patient not taking: Reported on 06/08/2022) 90 tablet 3   umeclidinium-vilanterol (ANORO ELLIPTA) 62.5-25 MCG/ACT AEPB Inhale 1 puff into the lungs daily. (Patient not taking: Reported on 06/08/2022) 90 each 3   No current facility-administered medications for this visit.    REVIEW OF SYSTEMS:   Constitutional: ( - ) fevers, ( - )  chills , ( - ) night sweats Eyes: ( - ) blurriness of vision, ( - ) double vision, ( - ) watery eyes Ears, nose, mouth, throat, and face: ( - ) mucositis, ( - ) sore throat Respiratory: ( - ) cough, ( - ) dyspnea, ( - ) wheezes Cardiovascular: ( - ) palpitation, ( - ) chest discomfort, ( - ) lower extremity swelling Gastrointestinal:  ( - ) nausea, ( - ) heartburn, ( - ) change in bowel habits Skin: ( - ) abnormal skin rashes Lymphatics: ( - ) new lymphadenopathy, ( - ) easy bruising Neurological: ( - ) numbness, ( - ) tingling, ( - ) new weaknesses Behavioral/Psych: ( - ) mood change, ( - ) new changes  All other systems were reviewed with the patient and are negative.  PHYSICAL EXAMINATION: ECOG PERFORMANCE STATUS: 1 - Symptomatic but completely ambulatory  Vitals:    06/08/22 1421  BP: (!) 151/75  Pulse: 70  Resp: 15  Temp: (!) 97.4 F (36.3 C)  SpO2: 96%   Filed Weights   06/08/22 1421  Weight: 189 lb 12.8 oz (86.1 kg)    GENERAL: Well-appearing elderly Caucasian male, alert, no distress and comfortable SKIN: skin color, texture, turgor are normal, no rashes or significant lesions EYES: conjunctiva are pink and non-injected, sclera clear LUNGS: clear to auscultation and percussion with normal breathing effort HEART: regular rate & rhythm and no murmurs and no lower extremity edema Musculoskeletal: no cyanosis of digits and no clubbing  PSYCH: alert & oriented x 3, fluent speech NEURO: no focal motor/sensory deficits  LABORATORY DATA:  I have reviewed the data as listed    Latest Ref Rng & Units 06/08/2022    1:56 PM 05/30/2022   11:06 AM 03/08/2022    2:08 PM  CBC  WBC 4.0 - 10.5 K/uL 6.2  6.4  6.2   Hemoglobin 13.0 - 17.0 g/dL 13.4  14.1  11.6   Hematocrit 39.0 - 52.0 % 39.6  40.9  37.6   Platelets 150 - 400 K/uL 149  138  152        Latest Ref Rng & Units 06/08/2022    1:56 PM 05/30/2022   11:06 AM 05/04/2022   11:14 AM  CMP  Glucose 70 - 99 mg/dL 184  142  246  BUN 8 - 23 mg/dL 15  14  8   $ Creatinine 0.61 - 1.24 mg/dL 0.91  0.95  0.95   Sodium 135 - 145 mmol/L 137  139  139   Potassium 3.5 - 5.1 mmol/L 4.5  4.2  4.4   Chloride 98 - 111 mmol/L 106  103  102   CO2 22 - 32 mmol/L 22  18  19   $ Calcium 8.9 - 10.3 mg/dL 9.7  9.5  9.6   Total Protein 6.5 - 8.1 g/dL 7.4  7.3    Total Bilirubin 0.3 - 1.2 mg/dL 0.6  0.6    Alkaline Phos 38 - 126 U/L 105  125    AST 15 - 41 U/L 39  39    ALT 0 - 44 U/L 23  27      Lab Results  Component Value Date   MPROTEIN Not Observed 03/08/2022   Lab Results  Component Value Date   KPAFRELGTCHN 46.6 (H) 03/08/2022   LAMBDASER 23.3 03/08/2022   KAPLAMBRATIO 2.00 (H) 03/08/2022    RADIOGRAPHIC STUDIES: VAS US CAROTID  Result Date: 05/25/2022 Carotid Arterial Duplex Study Patient  Name:  HILLMAN RUDISILL  Date of Exam:   05/25/2022 Medical Rec #: [id number]    Accession #:    [id number] Date of Birth: Sep 21, 1950    Patient Gender: M Patient Age:   72 years Exam Location:  [facility] Procedure:      VAS US CAROTID Referring Phys: Johann Capers CONTE --------------------------------------------------------------------------------  Indications:       Carotid artery disease. Patient denies any cerebrovascular                    symptoms at this time. Risk Factors:      Hypertension, hyperlipidemia, Diabetes, past history of                    smoking, coronary artery disease, PAD. Comparison Study:  Previous carotid duplex performed 01/21/21 with pre-CABG study                    showed RICA velocities of 99991111 cm/sec and LICA velocities of                    70/18 cm/sec. Performing Technologist: Mariane Masters RVT  Examination Guidelines: A complete evaluation includes B-mode imaging, spectral Doppler, color Doppler, and power Doppler as needed of all accessible portions of each vessel. Bilateral testing is considered an integral part of a complete examination. Limited examinations for reoccurring indications may be performed as noted.  Right Carotid Findings: +----------+--------+--------+--------+------------------------+--------+           PSV cm/sEDV cm/sStenosisPlaque Description      Comments +----------+--------+--------+--------+------------------------+--------+ CCA Prox  68      6                                                +----------+--------+--------+--------+------------------------+--------+ CCA Mid   105     17      <50%    diffuse and heterogenous         +----------+--------+--------+--------+------------------------+--------+ CCA Distal86      14              heterogenous                     +----------+--------+--------+--------+------------------------+--------+  ICA Prox  149     25      1-39%   heterogenous            tortuous  +----------+--------+--------+--------+------------------------+--------+ ICA Mid   86      16                                      tortuous +----------+--------+--------+--------+------------------------+--------+ ICA Distal102     20                                      tortuous +----------+--------+--------+--------+------------------------+--------+ ECA       122     15                                               +----------+--------+--------+--------+------------------------+--------+ +----------+--------+-------+----------------+-------------------+           PSV cm/sEDV cmsDescribe        Arm Pressure (mmHG) +----------+--------+-------+----------------+-------------------+ [id number]     0      Multiphasic, [id number]                 +----------+--------+-------+----------------+-------------------+ +---------+--------+--+--------+-+---------+ VertebralPSV cm/s28EDV cm/s6Antegrade +---------+--------+--+--------+-+---------+  Left Carotid Findings: +----------+--------+--------+--------+------------------+--------+           PSV cm/sEDV cm/sStenosisPlaque DescriptionComments +----------+--------+--------+--------+------------------+--------+ CCA Prox  56      9                                 tortuous +----------+--------+--------+--------+------------------+--------+ CCA Mid   100     19      <50%    heterogenous               +----------+--------+--------+--------+------------------+--------+ CCA Distal101     19                                         +----------+--------+--------+--------+------------------+--------+ ICA Prox  85      15      1-39%   heterogenous               +----------+--------+--------+--------+------------------+--------+ ICA Mid   51      13                                tortuous +----------+--------+--------+--------+------------------+--------+ ICA Distal59      14                                 tortuous +----------+--------+--------+--------+------------------+--------+ ECA       157     15                                         +----------+--------+--------+--------+------------------+--------+ +----------+--------+--------+----------------+-------------------+           PSV cm/sEDV cm/sDescribe        Arm Pressure (mmHG) +----------+--------+--------+----------------+-------------------+ [id number]     1  Multiphasic, [id number]                 +----------+--------+--------+----------------+-------------------+ +---------+--------+--+--------+-+---------+ VertebralPSV cm/s35EDV cm/s9Antegrade +---------+--------+--+--------+-+---------+   Summary: Right Carotid: Velocities in the right ICA are consistent with a 1-39% stenosis.                Non-hemodynamically significant plaque <50% noted in the CCA. Left Carotid: Velocities in the left ICA are consistent with a 1-39% stenosis.               Non-hemodynamically significant plaque <50% noted in the CCA. Vertebrals:  Bilateral vertebral arteries demonstrate antegrade flow. Subclavians: Normal flow hemodynamics were seen in bilateral subclavian              arteries. *See table(s) above for measurements and observations.  Electronically signed by Quay Burow MD on 05/25/2022 at 4:32:47 PM.    Final    MYOCARDIAL PERFUSION IMAGING  Result Date: 05/12/2022   The study is normal. The study is low risk.   No ST deviation was noted.   Left ventricular function is normal. End diastolic cavity size is normal.   Prior study available for comparison from 12/04/2013.    ASSESSMENT & PLAN Johncarlos Junge 72 y.o. male with medical history significant for iron deficiency anemia of unclear etiology who presents for a follow up visit.   After review of the labs, review of the records, and discussion with the patient the patients findings are most consistent with iron deficiency anemia of unclear etiology, suspected GI bleeding.    # Iron Deficiency Anemia 2/2 to Unclear Etiology, Suspect GI Bleeding -- Findings are consistent with iron deficiency anemia of unclear etiology --recommend GI referral for consideration of colonoscopy.  -- Continue to hold ferrous sulfate 325 mg.  If iron levels are persistently low would recommend restarting daily with a source of vitamin C --We will plan to proceed with IV iron therapy in order to help bolster the patient's blood counts if PO iron supplementation is inadequate.  --Labs today show white blood cell count 6.2, hemoglobin 13.4, MCV 91.7, and platelets of 149.  Iron studies pending --Plan for return to clinic in 3 months time or sooner if IV iron is required.   No orders of the defined types were placed in this encounter.   All questions were answered. The patient knows to call the clinic with any problems, questions or concerns.  A total of more than 30 minutes were spent on this encounter with face-to-face time and non-face-to-face time, including preparing to see the patient, ordering tests and/or medications, counseling the patient and coordination of care as outlined above.   Ledell Peoples, MD Department of Hematology/Oncology [facility] Phone: [phone] Pager: [phone] Email: [email or web address]  06/08/2022 5:35 PM

## 2022-06-09 ENCOUNTER — Telehealth: Payer: Self-pay | Admitting: Hematology and Oncology

## 2022-06-09 NOTE — Telephone Encounter (Signed)
Called patient per 2/22 los notes to schedule f/u. Patient scheduled and notified.

## 2022-06-13 ENCOUNTER — Encounter: Payer: Self-pay | Admitting: Nurse Practitioner

## 2022-06-13 ENCOUNTER — Other Ambulatory Visit (HOSPITAL_COMMUNITY): Payer: Self-pay

## 2022-06-13 ENCOUNTER — Telehealth (INDEPENDENT_AMBULATORY_CARE_PROVIDER_SITE_OTHER): Payer: Medicare Other | Admitting: Nurse Practitioner

## 2022-06-13 ENCOUNTER — Other Ambulatory Visit (INDEPENDENT_AMBULATORY_CARE_PROVIDER_SITE_OTHER): Payer: Medicare Other

## 2022-06-13 VITALS — Ht 65.0 in | Wt 186.0 lb

## 2022-06-13 DIAGNOSIS — J441 Chronic obstructive pulmonary disease with (acute) exacerbation: Secondary | ICD-10-CM | POA: Diagnosis not present

## 2022-06-13 DIAGNOSIS — R059 Cough, unspecified: Secondary | ICD-10-CM | POA: Diagnosis not present

## 2022-06-13 LAB — POCT INFLUENZA A/B
Influenza A, POC: NEGATIVE
Influenza B, POC: NEGATIVE

## 2022-06-13 LAB — POCT RESPIRATORY SYNCYTIAL VIRUS: RSV Rapid Ag: NEGATIVE

## 2022-06-13 MED ORDER — METHYLPREDNISOLONE SODIUM SUCC 40 MG IJ SOLR: 80.0000 mg | Freq: Once | INTRAMUSCULAR | Status: DC

## 2022-06-13 MED ORDER — METHYLPREDNISOLONE ACETATE 80 MG/ML IJ SUSP
80.0000 mg | Freq: Once | INTRAMUSCULAR | Status: AC
  Administered 2022-06-13: 80 mg via INTRAMUSCULAR

## 2022-06-13 MED ORDER — CEFTRIAXONE SODIUM 500 MG IJ SOLR
500.0000 mg | Freq: Once | INTRAMUSCULAR | Status: AC
  Administered 2022-06-13: 500 mg via INTRAMUSCULAR

## 2022-06-13 MED ORDER — METHYLPREDNISOLONE 4 MG PO TBPK
ORAL_TABLET | ORAL | 0 refills | Status: DC
  Filled 2022-06-13: qty 21, 6d supply, fill #0

## 2022-06-13 MED ORDER — DOXYCYCLINE HYCLATE 100 MG PO TABS
100.0000 mg | ORAL_TABLET | Freq: Two times a day (BID) | ORAL | 0 refills | Status: DC
  Filled 2022-06-13: qty 14, 7d supply, fill #0

## 2022-06-13 NOTE — Progress Notes (Signed)
Virtual Visit Encounter mychart visit.- converted to telephone due to technical difficulties   I connected with  Paul [name] on 06/13/22 at 10:15 AM EST by secure video and audio telemedicine application. I verified that I am speaking with the correct person using two identifiers.   I introduced myself as a Designer, jewellery with the practice. The limitations of evaluation and management by telemedicine discussed with the patient and the availability of in person appointments. The patient expressed verbal understanding and consent to proceed.  Participating parties in this visit include: Myself and patient  The patient is: Patient Location: Home I am: Provider Location: Office/Clinic Subjective:    CC and HPI: Paul [name] is a 72 y.o. year old male presenting for new evaluation and treatment of cough, congestions, body aches, shortness of breath.  Paul [name] presents today with concerns regarding respiratory symptoms. He began to experience congestion, loss of voice, cough with expectoration, body aches, and shortness of breath around the end of last week, with the onset of symptoms possibly starting on Friday or Saturday. He denies experiencing fevers that he is aware of. His worst symptom is shortness of breath and fatigue.   The patient mentions a recent visit to the doctor at [facility], where blood was drawn for lab tests. He has not received any results or follow-up communication from the center. Paul [name] does not report any other symptoms related to his current respiratory condition  Past medical history, Surgical history, Family history not pertinant except as noted below, Social history, Allergies, and medications have been entered into the medical record, reviewed, and corrections made.   Review of Systems:  All review of systems negative except what is listed in the HPI  Objective:    Alert and oriented x 4 Speaking in clear sentences with no shortness of breath. No  distress.  In Office: Lungs rhonchus bilaterally with wheezing throughout all fields.  He is short of breath with exertion.  He appears ill and weak.   Impression and Recommendations:    Problem List Items Addressed This Visit     COPD exacerbation ([place]) - Primary    Acute on chronic COPD exacerbation present today in the setting of URI. I did have him come into the office for evaluation and his lungs are rhonchus and wheezing. Flu test was negative. Given the symptoms he is experiencing and his baseline frailty, will start treatment today. Plan: - rocephin '500mg'$  IM, '80mg'$  depo medrol IM in office today - medrol dose pack and doxycycline at home for 7 days - samples of Trelegy Ellipta for 1 month with instructions to stop ANoro while using. Once a day dosing - is symptoms worsen or fail to improve, he has been instructed to follow-up or seek emergency care.       Relevant Medications   doxycycline (VIBRA-TABS) 100 MG tablet   methylPREDNISolone (MEDROL DOSEPAK) 4 MG TBPK tablet    orders and follow up as documented in EMR I discussed the assessment and treatment plan with the patient. The patient was provided an opportunity to ask questions and all were answered. The patient agreed with the plan and demonstrated an understanding of the instructions.   The patient was advised to call back or seek an in-person evaluation if the symptoms worsen or if the condition fails to improve as anticipated.  Follow-Up: prn  I provided 18 minutes of non-face-to-face interaction with this non face-to-face encounter including intake, same-day documentation, and chart review.   [name]  [name], NP , DNP, AGNP-c [place]

## 2022-06-13 NOTE — Assessment & Plan Note (Signed)
Acute on chronic COPD exacerbation present today in the setting of URI. I did have him come into the office for evaluation and his lungs are rhonchus and wheezing. Flu test was negative. Given the symptoms he is experiencing and his baseline frailty, will start treatment today. Plan: - rocephin '500mg'$  IM, '80mg'$  depo medrol IM in office today - medrol dose pack and doxycycline at home for 7 days - samples of Trelegy Ellipta for 1 month with instructions to stop ANoro while using. Once a day dosing - is symptoms worsen or fail to improve, he has been instructed to follow-up or seek emergency care.

## 2022-06-13 NOTE — Assessment & Plan Note (Signed)
>>  ASSESSMENT AND PLAN FOR COPD (CHRONIC OBSTRUCTIVE PULMONARY DISEASE) (HCC) WRITTEN ON 06/13/2022 12:32 PM BY Edith Groleau E, NP  Acute on chronic COPD exacerbation present today in the setting of URI. I did have him come into the office for evaluation and his lungs are rhonchus and wheezing. Flu test was negative. Given the symptoms he is experiencing and his baseline frailty, will start treatment today. Plan: - rocephin 500mg  IM, 80mg  depo medrol IM in office today - medrol dose pack and doxycycline at home for 7 days - samples of Trelegy Ellipta for 1 month with instructions to stop ANoro while using. Once a day dosing - is symptoms worsen or fail to improve, he has been instructed to follow-up or seek emergency care.

## 2022-06-14 NOTE — Telephone Encounter (Signed)
Rescheduled with SW 06/20/22  [place]  Direct Dial: [phone]

## 2022-06-15 ENCOUNTER — Other Ambulatory Visit (HOSPITAL_COMMUNITY): Payer: Self-pay

## 2022-06-15 DIAGNOSIS — J449 Chronic obstructive pulmonary disease, unspecified: Secondary | ICD-10-CM | POA: Diagnosis not present

## 2022-06-15 DIAGNOSIS — I10 Essential (primary) hypertension: Secondary | ICD-10-CM

## 2022-06-15 DIAGNOSIS — E1159 Type 2 diabetes mellitus with other circulatory complications: Secondary | ICD-10-CM

## 2022-06-16 ENCOUNTER — Other Ambulatory Visit (HOSPITAL_COMMUNITY): Payer: Self-pay

## 2022-06-19 ENCOUNTER — Telehealth: Payer: Self-pay | Admitting: Nurse Practitioner

## 2022-06-19 DIAGNOSIS — N1831 Chronic kidney disease, stage 3a: Secondary | ICD-10-CM

## 2022-06-19 NOTE — Telephone Encounter (Signed)
Received a request for medical records for a continuous glucometer. I do not see nor does Sarabeth recall pt asking for one. Pt was call to inquire if he requested one. Pt was advised to call back and let me know.

## 2022-06-20 ENCOUNTER — Ambulatory Visit: Payer: Self-pay

## 2022-06-20 NOTE — Patient Instructions (Signed)
Visit Information  Thank you for taking time to visit with me today. Please don't hesitate to contact me if I can be of assistance to you.   Following are the goals we discussed today:   Goals Addressed             This Visit's Progress    COMPLETED: Care Coordination Activities       Care Coordination Interventions: Referral received to assist patient with food insecurity Determined the patient is having difficulty affording food due to rising costs. Reported income is over the limit to receive FNS benefits. Patient declines running out of food as family and friends provide assistance as needed. Patient is still able to drive and endorses ability to access food pantries Mailed a list of food pantries in [place] Education on the [employer] app encouraging the patient to have his family assist him with this resource in order to locate free community meals and community gardens as well as food pantries close to home Encouraged patient to contact SW as needed         Your next appointment is by telephone on 3/18 at 1:30  Please call the care guide team at [phone] if you need to cancel or reschedule your appointment.   If you are experiencing a Mental Health or [place] or need someone to talk to, please call 911  The patient verbalized understanding of instructions, educational materials, and care plan provided today and DECLINED offer to receive copy of patient instructions, educational materials, and care plan.   No further follow up required: Please contact your primary care provider as needed.  Daneen Schick, BSW, CDP Social Worker, Certified Dementia Practitioner [place] [facility]  Care Coordination [phone]

## 2022-06-20 NOTE — Telephone Encounter (Signed)
Called pt again and was able to speak to pt concerning a continuous glucometer. Pt states that he has had a a "meter in his arm for a long time". I do not see any documentation chart to send to diabetic supply company. I did get verbal authorization from pt to send so supplies could be paid for by pt's insurance .Marland Kitchen Additional information will need to be documented in chart.

## 2022-06-20 NOTE — Patient Outreach (Signed)
  Care Coordination   Follow Up Visit Note   06/20/2022 Name: Maclaren Arroyave MRN: [id number] DOB: 05/03/1950  Shondale Slavinski is a 72 y.o. year old male who sees Early, [name], NP for primary care. I spoke with  [name] by phone today.  What matters to the patients health and wellness today?  Identify food resources    Goals Addressed             This Visit's Progress    COMPLETED: Care Coordination Activities       Care Coordination Interventions: Referral received to assist patient with food insecurity Determined the patient is having difficulty affording food due to rising costs. Reported income is over the limit to receive FNS benefits. Patient declines running out of food as family and friends provide assistance as needed. Patient is still able to drive and endorses ability to access food pantries Mailed a list of food pantries in [place] Education on the [employer] app encouraging the patient to have his family assist him with this resource in order to locate free community meals and community gardens as well as food pantries close to home Encouraged patient to contact SW as needed         SDOH assessments and interventions completed:  Yes  SDOH Interventions Today    Flowsheet Row Most Recent Value  SDOH Interventions   Food Insecurity Interventions Other (Comment)  [Provided with list of food pantries and [employer] administrator info]        Care Coordination Interventions:  Yes, provided   Interventions Today    Flowsheet Row Most Recent Value  Chronic Disease   Chronic disease during today's visit Diabetes, Chronic Obstructive Pulmonary Disease (COPD), Congestive Heart Failure (CHF), Hypertension (HTN)  General Interventions   General Interventions Discussed/Reviewed General Interventions Discussed, Transport planner Provided Provided [employer], Provided [employer], Provided  Education  Provided Orthoptist On [employer]        Follow up plan: No further intervention required. Patient will remain engaged with CCM RN Care Manager    Encounter Outcome:  Pt. Visit Completed   Daneen Schick, BSW, CDP Social Worker, Certified Dementia Practitioner [place] Coordination [phone]

## 2022-06-22 ENCOUNTER — Other Ambulatory Visit: Payer: Self-pay

## 2022-06-22 MED ORDER — FREESTYLE LIBRE 3 SENSOR MISC: 11 refills | Status: DC

## 2022-06-22 NOTE — Telephone Encounter (Signed)
Error

## 2022-06-22 NOTE — Telephone Encounter (Signed)
I have placed an order for the Freestyle libre 3 for him and sent it to [facility]. If this needs to go to another location (DME) can you please send this for me?

## 2022-06-22 NOTE — Telephone Encounter (Signed)
I called and talked to the pt. He stats he receive the [place] 2 from the medical supply company we received the fax from. I will send his most recent office note to them and that should suffice. If not I will send more notes. Pt. Stats he still has three more libre 2's left so is good for the next 6-8 weeks.

## 2022-06-26 DIAGNOSIS — M9903 Segmental and somatic dysfunction of lumbar region: Secondary | ICD-10-CM | POA: Diagnosis not present

## 2022-06-26 DIAGNOSIS — M5442 Lumbago with sciatica, left side: Secondary | ICD-10-CM | POA: Diagnosis not present

## 2022-06-29 DIAGNOSIS — M5442 Lumbago with sciatica, left side: Secondary | ICD-10-CM | POA: Diagnosis not present

## 2022-06-29 DIAGNOSIS — M9903 Segmental and somatic dysfunction of lumbar region: Secondary | ICD-10-CM | POA: Diagnosis not present

## 2022-07-03 ENCOUNTER — Telehealth: Payer: Medicare Other

## 2022-07-03 NOTE — Telephone Encounter (Signed)
   CCM RN Visit Note   07/03/22  Name: Jean-Paul Kosh MRN: [id number]      DOB: [birth year]  Subjective: Paul [name] is a 72 y.o. year old male who is a primary care patient of Jacolyn Reedy NP. The patient was referred to the Chronic Care Management team for assistance with care management needs subsequent to provider initiation of CCM services and plan of care.      An unsuccessful telephone outreach was attempted today to contact the patient about Chronic Care Management needs.    Plan:Telephone follow up appointment with care management team member scheduled for:  upon care guide rescheduling.  Jacqlyn Larsen [facility], BSN RN Case Manager [place] [phone]

## 2022-07-03 NOTE — Progress Notes (Signed)
Office Visit    Patient Name: Paul [name] Date of Encounter: 07/05/2022  PCP:  Tollie Eth, NP   [facility]  Cardiologist:  Kameo Bains [place], MD  Advanced Practice Provider:  No care team member to display Electrophysiologist:  None }  HPI    Tobias Dircks is a 72 y.o. male with past medical history of CAD status PCI subsequent CABG 01/25/2021 (LIMA to LAD free graft, jump off vein graft proximally, R SVG to OM, type 2 diabetes mellitus, hyperlipidemia, COPD, HTN, OSA, and mild carotid artery disease presents today for follow-up appointment.  Previously seen by Dr. Herbie Baltimore with history of CAD and PCI to D1 11/2011.  He was seen 12/13/2020 by me noting increasing precordial pain and exertional dyspnea.  Cardiac catheterization 12/28/2020 showed extensive LAD and circumflex disease with occluded diagonal and recommended for cardiothoracic surgery consult.  Underwent CABG x 2 on 01/25/2021 by Dr. Cliffton Asters.  Started on Flomax due to difficulty voiding.  On 01/30/2021 had an episode where he fell off of a chair after coughing with a small laceration to the nose but otherwise neurologically intact.  Head CT negative for intracranial injury.  Discharged to SNF.  Seen in follow-up 03/18/2022 with caretakers.  Had a viral illness and supportive care was recommended.  He was seen by Gillian Shields, NP 05/19/2021.  He was undergoing workup with Ortho and was pending neurosurgery for back and shoulder pain.  Working with physical therapy at home at the time.  Noted some "knots" under his chest wall which will likely keloid scarring.  Gradually increasing activity with no reported symptoms.  No changes were made to his treatment regimen he was advised to follow-up in 6 months.  He was seen by Eligha Bridegroom, NP 04/03/2022 for evaluation of shortness of breath and fatigue.  He could not complete his normal activities.  He endorsed exhaustion by walking to the office that day.   Additionally, activity was limited by chronic back pain.  He had mild bilateral lower extremity edema.  He felt his abdomen was more bloated as well.  He does try to limit salt and avoid sugar but is not strict about his diet. Echo and Myoview studies were done and were normal.   In review of his records it appears his statin was dropped off his list after his Rehab stay and never resumed. He still complains of lack of energy. Has to rest frequently. No chest pain or dyspnea. Sugars are elevated. Reports sugar typically around 200. Did have URI last month and was given a medrol dose pack.    Past Medical History    Past Medical History:  Diagnosis Date   CAD 11/24/2011   a) Mild-to-moderate 30-40% lesions in the RCA, LAD and Circumflex. b) CULPRIT LESION: long tubular 70-80% lesion in D1 with FFR of 0.7 --> PCI w/ Xience Xpedition DES 2.5 mm x 30 mm (2.65 MM); c) Lexiscan Myoview 11/2013: No Ischemia or Infarct (Inferior Gut Attenuation) EF 63%.   Chronic fatigue syndrome 06/13/2021   Constipation by delayed colonic transit 06/13/2021   COPD (chronic obstructive pulmonary disease) (HCC)    "don't have full case of it; I'm right there at it"   Diabetes mellitus without complication (HCC)    Dysphagia    Essential hypertension 11/24/2011   Exertional dyspnea, chronic    Gout    Hyperglycemia 03/11/2019   Hyperlipidemia with target LDL less than 70 11/24/2011   Hypo-osmolality and hyponatremia 03/11/2019  Long term (current) use of insulin (HCC) 07/15/2019   Obesity (BMI 30.0-34.9)    OSA (obstructive sleep apnea), uses oxygen at home did not tolerate cpap 11/24/2011   Paresthesia of skin 02/17/2015   S/P CABG (coronary artery bypass graft)    Past Surgical History:  Procedure Laterality Date   CERVICAL SPINE SURGERY  2012   CORONARY ANGIOPLASTY WITH STENT PLACEMENT  11/23/2011   "1; first one"   CORONARY ARTERY BYPASS GRAFT N/A 01/25/2021   Procedure: CORONARY ARTERY BYPASS GRAFTING  (CABG) X2, USING LEFT INTERNAL MAMMARY ARTERY AND LEFT LEG GREATER SAPHENOUS VEIN HARVESTED ENDOSCOPICALLY;  Surgeon: Corliss Skains, MD;  Location: [facility] OR;  Service: Open Heart Surgery;  Laterality: N/A;   ENDOVEIN HARVEST OF GREATER SAPHENOUS VEIN Bilateral 01/25/2021   Procedure: ENDOVEIN HARVEST OF GREATER SAPHENOUS VEIN;  Surgeon: Corliss Skains, MD;  Location: [facility] OR;  Service: Open Heart Surgery;  Laterality: Bilateral;   LEFT HEART CATH AND CORONARY ANGIOGRAPHY N/A 12/28/2020   Procedure: LEFT HEART CATH AND CORONARY ANGIOGRAPHY;  Surgeon: Marykay Lex, MD;  Location: [facility] INVASIVE CV LAB;  Service: Cardiovascular;  Laterality: N/A;   LEFT HEART CATHETERIZATION WITH CORONARY ANGIOGRAM N/A 11/23/2011   Procedure: LEFT HEART CATHETERIZATION WITH CORONARY ANGIOGRAM;  Surgeon: Marykay Lex, MD;  Location: [facility] CATH LAB;  Service: Cardiovascular;  Laterality: N/A;   TEE WITHOUT CARDIOVERSION N/A 01/25/2021   Procedure: TRANSESOPHAGEAL ECHOCARDIOGRAM (TEE);  Surgeon: Corliss Skains, MD;  Location: [facility] OR;  Service: Open Heart Surgery;  Laterality: N/A;    Allergies  Allergies  Allergen Reactions   Morphine And Related Itching    Possible itching due to morphine 01/26/21   Cortisone    Other Other (See Comments)    Cats- eyes burn     EKGs/Labs/Other Studies Reviewed:   The following studies were reviewed today:   LHC 12/28/20   Prox LAD to Mid LAD lesion is 65% stenosed with 30% stenosed side branch in 1st Diag. (RFR 0.90-0.91)   1st Diag lesion is 100% stenosed. (In-stent Re-stenosis/occlusion)   Mid LAD lesion is 40% stenosed.   Mid LAD to Dist LAD lesion is 75% stenosed. (RFR 0.74)   Ost Cx to Mid Cx lesion is 55% stenosed with 20% stenosed side branch in 1st Mrg.   Mid Cx lesion is 90% stenosed.   Prox RCA lesion is 40% stenosed.   -------------------------------------------   The left ventricular systolic function is normal.  The left ventricular ejection  fraction is 50-55% by visual estimate.   LV end diastolic pressure is normal.   There is no aortic valve stenosis.   SUMMARY Severe 3 Vessel CAD:  100% ISR of Major D1 stent,  focal 65% prox LAD (@ D1) lesion - FFR 0.91 followed by 40% LAD @ SP1 then long 70% mid LAD (FFR 0.74 - SIGNIFICANT).  prox-mid LCx diffuse 55% with focal 90% just after AVG branch in LCx-Lateral OM2. Moderate Prox-mid RCA 35-40% @ bend  Poor LV Gram image, but appears to have preserved LVEF with normal LVEDP (2 D Echo ordered).      RECOMMENDATIONS With extensive LAD and LCx disease and occluded diagonal, best treatment option would likely be CVTS.  He has been on Plavix since his PCI. CVTS consult placed, 2D echo ordered, and Plavix discontinued. Anticipate outpatient CVTS clinic visit with staged CABG after Plavix washout -> otherwise would need to consider extensive PCI of the LAD with focal PCI of LCx and attempted recanalization of  occluded D1 stent Will need to continue aggressive risk factor modification/Guideline Directed Medical Management   Echo 12/28/20 1. Left ventricular ejection fraction, by estimation, is 60 to 65%. The  left ventricle has normal function. The left ventricle has no regional  wall motion abnormalities. Left ventricular diastolic parameters are  consistent with Grade I diastolic  dysfunction (impaired relaxation).   2. Right ventricular systolic function is normal. The right ventricular  size is normal.   3. The mitral valve is normal in structure. No evidence of mitral valve  regurgitation. No evidence of mitral stenosis.   4. The aortic valve is normal in structure. Aortic valve regurgitation is  not visualized. No aortic stenosis is present.   5. The inferior vena cava is normal in size with greater than 50%  respiratory variability, suggesting right atrial pressure of 3 mmHg.   VAS Pre CABG Carotid and Extremities 01/21/21 Right Carotid: Velocities in the right ICA are  consistent with a 1-39%  stenosis.   Left Carotid: Velocities in the left ICA are consistent with a 1-39%  stenosis.  Vertebrals: Bilateral vertebral arteries demonstrate antegrade flow.   Right ABI: Resting right ankle-brachial index is within normal range. No  evidence of significant right lower extremity arterial disease.  Left ABI: Resting left ankle-brachial index is within normal range. No  evidence of significant left lower extremity arterial disease.  Right Upper Extremity: Doppler waveforms decrease >50% with right radial  compression. Doppler waveforms remain within normal limits with right  ulnar compression.  Left Upper Extremity: Doppler waveform obliterate with left radial  compression. Doppler waveforms remain within normal limits with left ulnar  compression.   Echo 05/04/22: IMPRESSIONS     1. Left ventricular ejection fraction, by estimation, is 65 to 70%. The  left ventricle has normal function. The left ventricle has no regional  wall motion abnormalities. Left ventricular diastolic parameters are  consistent with Grade I diastolic  dysfunction (impaired relaxation).   2. Right ventricular systolic function is normal. The right ventricular  size is normal. Tricuspid regurgitation signal is inadequate for assessing  PA pressure.   3. The mitral valve is abnormal. Trivial mitral valve regurgitation.   4. The aortic valve was not well visualized. Aortic valve regurgitation  is not visualized. Aortic valve sclerosis/calcification is present,  without any evidence of aortic stenosis.   Comparison(s): Changes from prior study are noted. 12/28/2020: LVEF 60-65%.   Myoview 05/12/22: Study Highlights      The study is normal. The study is low risk.   No ST deviation was noted.   Left ventricular function is normal. End diastolic cavity size is normal.   Prior study available for comparison from 12/04/2013.  EKG:  EKG is  not ordered today.    Recent  Labs: 10/28/2021: Magnesium 1.9 05/30/2022: TSH 1.570 06/08/2022: ALT 23; BUN 15; Creatinine 0.91; Hemoglobin 13.4; Platelet Count 149; Potassium 4.5; Sodium 137  Recent Lipid Panel    Component Value Date/Time   CHOL 143 05/30/2022 1106   TRIG 176 (H) 05/30/2022 1106   HDL 27 (L) 05/30/2022 1106   CHOLHDL 5.3 (H) 05/30/2022 1106   CHOLHDL 6.6 11/23/2011 0846   VLDL 29 11/23/2011 0846   LDLCALC 85 05/30/2022 1106    Home Medications   Current Meds  Medication Sig   acetaminophen (TYLENOL) 500 MG tablet Take 1,500-2,000 mg by mouth daily.   albuterol (VENTOLIN HFA) 108 (90 Base) MCG/ACT inhaler Inhale 2 puffs into the lungs every 4 (four)  hours as needed. For breathing.   allopurinol (ZYLOPRIM) 100 MG tablet Take 1 tablet (100 mg total) by mouth daily.   AMBULATORY NON FORMULARY MEDICATION Medical alert device as covered by insurance for frequent falls and weakness.   amLODipine (NORVASC) 5 MG tablet Take 1 tablet (5 mg total) by mouth daily.   aspirin 81 MG EC tablet Take 1 tablet (81 mg total) by mouth daily.   doxycycline (VIBRA-TABS) 100 MG tablet Take 1 tablet (100 mg total) by mouth 2 (two) times daily.   furosemide (LASIX) 20 MG tablet Take 1 tablet (20 mg total) by mouth daily.   gabapentin (NEURONTIN) 300 MG capsule Take 2 capsules (600 mg total) by mouth 3 (three) times daily. For back and foot pain.   glucose blood (ONETOUCH VERIO) test strip Use to check blood glucose 3 times daily   HYDROcodone-acetaminophen (NORCO/VICODIN) 5-325 MG tablet Take 1 tablet by mouth every 8 (eight) hours as needed fo rpain   HYDROcodone-acetaminophen (NORCO/VICODIN) 5-325 MG tablet Take 1-2 tablets by mouth in the morning, 1 tablet at noon, and at 1 tablet at bedtime.   HYDROcodone-acetaminophen (NORCO/VICODIN) 5-325 MG tablet Take 1-2 tablets by mouth in the morning, 1 tablet at noon, and 1 tablet at bedtime.   [START ON 07/12/2022] HYDROcodone-acetaminophen (NORCO/VICODIN) 5-325 MG tablet Take  1-2 tablets by mouth in the morning, 1 tablet at noon, and 1 tablet at bedtime.   insulin glargine, 1 Unit Dial, (TOUJEO SOLOSTAR) 300 UNIT/ML Solostar Pen Inject 40 Units into the skin daily.   insulin lispro (HUMALOG KWIKPEN) 200 UNIT/ML KwikPen INJECT 14 UNITS UNDER THE SKIN EVERY MORNING, 14 UNITS WITH LUNCH AND 14 UNITS WITH SUPPER, DAILY   Insulin Pen Needle 32G X 4 MM MISC Use daily with insulin as directed   Iron, Ferrous Sulfate, 325 (65 Fe) MG TABS Take 1 tablet (325 mg) by mouth daily.   metFORMIN (GLUCOPHAGE-XR) 500 MG 24 hr tablet Take 2 tablets (1,000 mg total) by mouth 2 (two) times daily.   metoprolol tartrate (LOPRESSOR) 25 MG tablet Take 1 tablet (25 mg total) by mouth 2 (two) times daily.   pantoprazole (PROTONIX) 40 MG tablet Take 1 tablet (40 mg total) by mouth daily.   potassium chloride (KLOR-CON M) 10 MEQ tablet Take 1 tablet (10 mEq total) by mouth daily.   rosuvastatin (CRESTOR) 20 MG tablet Take 1 tablet (20 mg total) by mouth daily.   Tetrahydrozoline HCl (VISINE EXTRA OP) Place 1 drop into both eyes as needed (itching).   umeclidinium-vilanterol (ANORO ELLIPTA) 62.5-25 MCG/ACT AEPB Inhale 1 puff into the lungs daily.   zolpidem (AMBIEN) 10 MG tablet Take 1 tablet (10 mg total) by mouth at bedtime as needed for sleep   [DISCONTINUED] methylPREDNISolone (MEDROL DOSEPAK) 4 MG TBPK tablet Follow package directions     Review of Systems      All other systems reviewed and are otherwise negative except as noted above.  Physical Exam    VS:  BP 122/62 (BP Location: Left Arm, Patient Position: Sitting, Cuff Size: Normal)   Pulse 67   Ht 5\' 5"  (1.651 m)   Wt 186 lb 3.2 oz (84.5 kg)   SpO2 95%   BMI 30.99 kg/m  , BMI Body mass index is 30.99 kg/m.  Wt Readings from Last 3 Encounters:  07/05/22 186 lb 3.2 oz (84.5 kg)  06/13/22 186 lb (84.4 kg)  06/08/22 189 lb 12.8 oz (86.1 kg)     GEN: Well nourished, well developed,  in no acute distress. HEENT:  normal. Neck: Supple, no JVD, carotid bruits, or masses. Cardiac: RRR, no murmurs, rubs, or gallops. No clubbing, cyanosis, edema.  Radials/PT 2+ and equal bilaterally.  Respiratory:  Respirations regular and unlabored, clear to auscultation bilaterally. GI: Soft, nontender, nondistended. MS: No deformity or atrophy. Skin: Warm and dry, no rash. Neuro:  Strength and sensation are intact. Psych: Normal affect.  Assessment & Plan    CAD status post PCI and ultimately CABG 12/2020 -no chest pain or SOB. Complains of a lot of fatigue.  -reviewed echo and recent lexiscan myoview- no ischemia and normal cardiac function -continue current medication regimen  Hyperlipidemia -LDL 85.  -statin had been erroneously discontinued.  - will resume Crestor 20 mg daily.  - repeat lab in 3 months. Goal LDL < 55.   Essential hypertension -well controlled  today -Continue Lasix 20 mg daily, Lopressor 25 mg twice daily  Bilateral carotid artery stenosis -minor disease on dopplers in Feb  Fatigue - unclear etiology.  DM Type 2 -on insulin and metformin -symptoms of neuropathy - per PCP    Disposition: Follow up 6 months.   Signed, Chesni Vos [place], MD 07/05/2022, 10:19 AM [facility]

## 2022-07-04 DIAGNOSIS — M5442 Lumbago with sciatica, left side: Secondary | ICD-10-CM | POA: Diagnosis not present

## 2022-07-04 DIAGNOSIS — M9903 Segmental and somatic dysfunction of lumbar region: Secondary | ICD-10-CM | POA: Diagnosis not present

## 2022-07-05 ENCOUNTER — Ambulatory Visit: Payer: Medicare Other | Attending: Cardiology | Admitting: Cardiology

## 2022-07-05 ENCOUNTER — Other Ambulatory Visit (HOSPITAL_COMMUNITY): Payer: Self-pay

## 2022-07-05 ENCOUNTER — Encounter: Payer: Self-pay | Admitting: Cardiology

## 2022-07-05 VITALS — BP 122/62 | HR 67 | Ht 65.0 in | Wt 186.2 lb

## 2022-07-05 DIAGNOSIS — Z951 Presence of aortocoronary bypass graft: Secondary | ICD-10-CM | POA: Diagnosis not present

## 2022-07-05 DIAGNOSIS — I10 Essential (primary) hypertension: Secondary | ICD-10-CM | POA: Insufficient documentation

## 2022-07-05 DIAGNOSIS — I25708 Atherosclerosis of coronary artery bypass graft(s), unspecified, with other forms of angina pectoris: Secondary | ICD-10-CM | POA: Insufficient documentation

## 2022-07-05 DIAGNOSIS — E785 Hyperlipidemia, unspecified: Secondary | ICD-10-CM | POA: Insufficient documentation

## 2022-07-05 MED ORDER — ROSUVASTATIN CALCIUM 20 MG PO TABS
20.0000 mg | ORAL_TABLET | Freq: Every day | ORAL | 3 refills | Status: DC
  Filled 2022-07-05: qty 30, 30d supply, fill #0

## 2022-07-05 NOTE — Patient Instructions (Addendum)
Start Crestor 20 mg daily  We will arrange repeat lab work in 3 months.   Follow up appointment 6 months

## 2022-07-10 ENCOUNTER — Telehealth: Payer: Self-pay | Admitting: Nurse Practitioner

## 2022-07-10 NOTE — Telephone Encounter (Signed)
Pt needs Pen needles for his Toujeo [facility] to [facility],  he only has 1 left,  I gave him some samples to hold until he gets his mail order.  He said the ones he has now are .23 mm x 4 mm, chart says 32 g x 4 mm

## 2022-07-11 ENCOUNTER — Other Ambulatory Visit: Payer: Self-pay

## 2022-07-11 MED ORDER — INSULIN PEN NEEDLE 32G X 4 MM MISC: 3 refills | Status: DC

## 2022-07-17 ENCOUNTER — Other Ambulatory Visit (HOSPITAL_COMMUNITY): Payer: Self-pay

## 2022-07-26 ENCOUNTER — Other Ambulatory Visit (HOSPITAL_COMMUNITY): Payer: Self-pay

## 2022-07-26 ENCOUNTER — Telehealth: Payer: Self-pay | Admitting: Nurse Practitioner

## 2022-07-26 ENCOUNTER — Other Ambulatory Visit: Payer: Self-pay | Admitting: Nurse Practitioner

## 2022-07-26 DIAGNOSIS — F5104 Psychophysiologic insomnia: Secondary | ICD-10-CM

## 2022-07-26 MED ORDER — ZOLPIDEM TARTRATE 10 MG PO TABS
10.0000 mg | ORAL_TABLET | Freq: Every evening | ORAL | 0 refills | Status: DC | PRN
  Filled 2022-07-26 – 2022-07-27 (×2): qty 30, 30d supply, fill #0

## 2022-07-26 NOTE — Telephone Encounter (Signed)
Pt called and is requesting a short term, supply of ambiem she did not get the mail order for this month he did not have it on auto fill, he has been out for 3 nights, I called and they are going to send it in for him but it will take3 to 5 days for it to get here, he would like some sent to the  [facility]  please and in informed him that the insurance may not pay for it and he was ok with that

## 2022-07-27 ENCOUNTER — Other Ambulatory Visit (HOSPITAL_COMMUNITY): Payer: Self-pay

## 2022-08-02 ENCOUNTER — Other Ambulatory Visit (HOSPITAL_COMMUNITY): Payer: Self-pay

## 2022-08-02 MED ORDER — OXYCODONE-ACETAMINOPHEN 5-325 MG PO TABS
1.0000 | ORAL_TABLET | Freq: Every day | ORAL | 0 refills | Status: DC
  Filled 2022-08-16: qty 120, 30d supply, fill #0

## 2022-08-03 ENCOUNTER — Telehealth: Payer: Self-pay

## 2022-08-03 NOTE — Telephone Encounter (Signed)
Pt just called back & I gave him your number to call back

## 2022-08-03 NOTE — Progress Notes (Signed)
  Chronic Care Management Note  08/03/2022 Name: Paul [name] MRN: [id number] DOB: Oct 19, 1950  Paul [name] is a 72 y.o. year old male who is a primary care patient of Early, Sung Amabile, NP and is actively engaged with the Chronic Care Management team. I reached out to [name] by phone today to assist with re-scheduling a follow up visit with the RN Case Manager  Follow up plan: Unsuccessful telephone outreach attempt made. A HIPAA compliant phone message was left for the patient providing contact information and requesting a return call.  The care management team will reach out to the patient again over the next 7 days.  If patient returns call to provider office, please advise to call CCM Care Guide Penne Lash  at [phone]  Penne Lash, RMA Care Guide [facility]  [place], [place] [address] Direct Dial: [phone] [name]

## 2022-08-04 NOTE — Progress Notes (Signed)
  Chronic Care Management Note  08/04/2022 Name: [name] MRN: [id number] DOB: 07-Dec-1950  Paul [name] is a 72 y.o. year old male who is a primary care patient of Early, Sung Amabile, NP and is actively engaged with the Chronic Care Management team. I reached out to [name] by phone today to assist with re-scheduling a follow up visit with the RN Case Manager  Follow up plan: Unsuccessful telephone outreach attempt made. A HIPAA compliant phone message was left for the patient providing contact information and requesting a return call.  The care management team will reach out to the patient again over the next 7 days.  If patient returns call to provider office, please advise to call CCM Care Guide Penne Lash  at [phone]  Penne Lash, RMA Care Guide [facility]  [place], [place] [address] Direct Dial: [phone] [email or web address]

## 2022-08-10 ENCOUNTER — Ambulatory Visit (INDEPENDENT_AMBULATORY_CARE_PROVIDER_SITE_OTHER): Payer: Medicare Other | Admitting: Family Medicine

## 2022-08-10 ENCOUNTER — Encounter: Payer: Self-pay | Admitting: Family Medicine

## 2022-08-10 ENCOUNTER — Telehealth: Payer: Self-pay

## 2022-08-10 VITALS — BP 122/70 | HR 69 | Temp 98.1°F | Resp 22 | Wt 186.8 lb

## 2022-08-10 DIAGNOSIS — R5383 Other fatigue: Secondary | ICD-10-CM

## 2022-08-10 DIAGNOSIS — E1122 Type 2 diabetes mellitus with diabetic chronic kidney disease: Secondary | ICD-10-CM | POA: Diagnosis not present

## 2022-08-10 DIAGNOSIS — R269 Unspecified abnormalities of gait and mobility: Secondary | ICD-10-CM | POA: Diagnosis not present

## 2022-08-10 DIAGNOSIS — Z8616 Personal history of COVID-19: Secondary | ICD-10-CM | POA: Insufficient documentation

## 2022-08-10 DIAGNOSIS — N1831 Chronic kidney disease, stage 3a: Secondary | ICD-10-CM | POA: Diagnosis not present

## 2022-08-10 DIAGNOSIS — Z794 Long term (current) use of insulin: Secondary | ICD-10-CM | POA: Diagnosis not present

## 2022-08-10 NOTE — Telephone Encounter (Signed)
Pt advised before he left after today's appt that he would like to get a referral to a podiatrist to help him with his feet and toenails.

## 2022-08-10 NOTE — Progress Notes (Signed)
   Subjective:    Patient ID: Paul [name], male    DOB: 1950-11-20, 72 y.o.   MRN: [id number]  HPI He complains of a 1 week history of difficulty swallowing, weakness, fatigue but no fever, chills, cough or earache.  He also complains of a long history of shoulder pain.  Review of the record indicates he had a history of COVID and states that his walking disability is have occurred since then.  He has underlying diabetes and has not been seen in several months.  He states that he is doing a good job taking care of his diabetes.   Review of Systems     Objective:   Physical Exam Alert and in no distress. Tympanic membranes and canals are normal. Pharyngeal area is normal. Neck is supple without adenopathy or thyromegaly. Cardiac exam shows a regular sinus rhythm without murmurs or gallops. Lungs are clear to auscultation.        Assessment & Plan:  Fatigue, unspecified type - Plan: CBC with Differential/Platelet, Comprehensive metabolic panel  Type 2 diabetes mellitus with stage 3a chronic kidney disease, with long-term current use of insulin - Plan: Hemoglobin A1c  History of COVID-19  Abnormal gait Difficult to assess what is really going on.  Explained that there is really nothing at this point that I could offer him in terms of an antibiotic or help with his fatigue and he would need to follow-up with Minna Merritts about that and his diabetes.

## 2022-08-11 LAB — CBC WITH DIFFERENTIAL/PLATELET
Basophils Absolute: 0.1 10*3/uL (ref 0.0–0.2)
Basos: 1 %
EOS (ABSOLUTE): 0.3 10*3/uL (ref 0.0–0.4)
Eos: 4 %
Hematocrit: 42.9 % (ref 37.5–51.0)
Hemoglobin: 14.3 g/dL (ref 13.0–17.7)
Immature Grans (Abs): 0 10*3/uL (ref 0.0–0.1)
Immature Granulocytes: 0 %
Lymphocytes Absolute: 2 10*3/uL (ref 0.7–3.1)
Lymphs: 27 %
MCH: 31 pg (ref 26.6–33.0)
MCHC: 33.3 g/dL (ref 31.5–35.7)
MCV: 93 fL (ref 79–97)
Monocytes Absolute: 0.5 10*3/uL (ref 0.1–0.9)
Monocytes: 7 %
Neutrophils Absolute: 4.5 10*3/uL (ref 1.4–7.0)
Neutrophils: 61 %
Platelets: 135 10*3/uL — ABNORMAL LOW (ref 150–450)
RBC: 4.61 x10E6/uL (ref 4.14–5.80)
RDW: 12.5 % (ref 11.6–15.4)
WBC: 7.4 10*3/uL (ref 3.4–10.8)

## 2022-08-11 LAB — COMPREHENSIVE METABOLIC PANEL
ALT: 41 IU/L (ref 0–44)
AST: 51 IU/L — ABNORMAL HIGH (ref 0–40)
Albumin/Globulin Ratio: 1.5 (ref 1.2–2.2)
Albumin: 4.3 g/dL (ref 3.8–4.8)
Alkaline Phosphatase: 134 IU/L — ABNORMAL HIGH (ref 44–121)
BUN/Creatinine Ratio: 16 (ref 10–24)
BUN: 16 mg/dL (ref 8–27)
Bilirubin Total: 0.5 mg/dL (ref 0.0–1.2)
CO2: 15 mmol/L — ABNORMAL LOW (ref 20–29)
Calcium: 9.5 mg/dL (ref 8.6–10.2)
Chloride: 104 mmol/L (ref 96–106)
Creatinine, Ser: 1.01 mg/dL (ref 0.76–1.27)
Globulin, Total: 2.8 g/dL (ref 1.5–4.5)
Glucose: 128 mg/dL — ABNORMAL HIGH (ref 70–99)
Potassium: 4.5 mmol/L (ref 3.5–5.2)
Sodium: 139 mmol/L (ref 134–144)
Total Protein: 7.1 g/dL (ref 6.0–8.5)
eGFR: 79 mL/min/{1.73_m2} (ref >59)

## 2022-08-11 LAB — HEMOGLOBIN A1C
Est. average glucose Bld gHb Est-mCnc: 183 mg/dL
Hgb A1c MFr Bld: 8 % — ABNORMAL HIGH (ref 4.8–5.6)

## 2022-08-16 ENCOUNTER — Other Ambulatory Visit (HOSPITAL_COMMUNITY): Payer: Self-pay

## 2022-08-17 ENCOUNTER — Encounter: Payer: Self-pay | Admitting: Nurse Practitioner

## 2022-08-17 ENCOUNTER — Ambulatory Visit (INDEPENDENT_AMBULATORY_CARE_PROVIDER_SITE_OTHER): Payer: Medicare Other | Admitting: Nurse Practitioner

## 2022-08-17 VITALS — BP 120/70 | HR 64 | Wt 188.2 lb

## 2022-08-17 DIAGNOSIS — M79604 Pain in right leg: Secondary | ICD-10-CM

## 2022-08-17 DIAGNOSIS — E538 Deficiency of other specified B group vitamins: Secondary | ICD-10-CM | POA: Diagnosis not present

## 2022-08-17 DIAGNOSIS — N1831 Chronic kidney disease, stage 3a: Secondary | ICD-10-CM | POA: Diagnosis not present

## 2022-08-17 DIAGNOSIS — R748 Abnormal levels of other serum enzymes: Secondary | ICD-10-CM

## 2022-08-17 DIAGNOSIS — L602 Onychogryphosis: Secondary | ICD-10-CM

## 2022-08-17 DIAGNOSIS — E1142 Type 2 diabetes mellitus with diabetic polyneuropathy: Secondary | ICD-10-CM

## 2022-08-17 DIAGNOSIS — I739 Peripheral vascular disease, unspecified: Secondary | ICD-10-CM | POA: Diagnosis not present

## 2022-08-17 DIAGNOSIS — J449 Chronic obstructive pulmonary disease, unspecified: Secondary | ICD-10-CM | POA: Diagnosis not present

## 2022-08-17 DIAGNOSIS — R29898 Other symptoms and signs involving the musculoskeletal system: Secondary | ICD-10-CM | POA: Diagnosis not present

## 2022-08-17 DIAGNOSIS — E1122 Type 2 diabetes mellitus with diabetic chronic kidney disease: Secondary | ICD-10-CM | POA: Diagnosis not present

## 2022-08-17 DIAGNOSIS — M79605 Pain in left leg: Secondary | ICD-10-CM

## 2022-08-17 DIAGNOSIS — F419 Anxiety disorder, unspecified: Secondary | ICD-10-CM | POA: Diagnosis not present

## 2022-08-17 DIAGNOSIS — I25118 Atherosclerotic heart disease of native coronary artery with other forms of angina pectoris: Secondary | ICD-10-CM | POA: Diagnosis not present

## 2022-08-17 DIAGNOSIS — I509 Heart failure, unspecified: Secondary | ICD-10-CM

## 2022-08-17 HISTORY — DX: Abnormal levels of other serum enzymes: R74.8

## 2022-08-17 MED ORDER — CYANOCOBALAMIN 1000 MCG/ML IJ SOLN
1000.0000 ug | INTRAMUSCULAR | Status: DC
  Administered 2022-08-17: 1000 ug via INTRAMUSCULAR

## 2022-08-17 NOTE — Progress Notes (Signed)
[name], DNP, AGNP-c [facility]  [address] [place], [place] [address] [phone]  ESTABLISHED PATIENT- Chronic Health and/or Follow-Up Visit  Blood pressure 120/70, pulse 64, weight 188 lb 3.2 oz (85.4 kg), SpO2 92 %.    Paul [name] is a 72 y.o. year old male presenting today for evaluation and management of chronic conditions.   [name] reports feeling "okay" today but mentions that his legs are not functioning well, lacking energy and making mobility difficult. He is unable to walk long distances, including to his truck, without feeling exhausted, though he clarifies that his breathing is not worsening. He expresses a strong desire to regain his former levels of activity and alleviate his current stress due to limited capabilities.  He describes a recent change in his pain medication, which has been adjusted to a different type but with the same milligram dosage, noting a slight improvement. He also expresses discomfort in his groins when stretching, which extends to his belly, though he observes no visible protrusions. [name] suspects he might have strained himself.  Reflecting on previous lab tests conducted by Dr. Susann Givens, [name] recalls feeling extremely unwell at the time and had inquired about potential treatments like antibiotics or a B12 shot to boost his energy. He recalls having a vitamin D shot about 15 years ago, which had significantly improved his well-being, prompting him to consider it again as a potential remedy for his current condition. He discusses concerns about the frequency of vitamin D shots, referencing advice he received about monitoring levels to avoid excessive dosages.  [name] also brings up issues with his feet, including pain and difficulty in managing toenail care, which he hopes to address through podiatry. He notes improvements in his diabetes management, with recent blood sugar levels being more controlled compared to past  readings.  Lastly, [name] touches on his personal life, mentioning that his daughter and son were absent during a recent health issue, but his friend Mardelle Matte and his girlfriend provided necessary support.  All ROS negative with exception of what is listed above.   PHYSICAL EXAM Physical Exam Vitals and nursing note reviewed.  Constitutional:      Appearance: He is ill-appearing.  HENT:     Head: Normocephalic.  Eyes:     Pupils: Pupils are equal, round, and reactive to light.  Neck:     Vascular: No carotid bruit.  Cardiovascular:     Rate and Rhythm: Normal rate and regular rhythm.     Pulses: Normal pulses.     Heart sounds: Normal heart sounds.  Pulmonary:     Effort: Prolonged expiration present. No tachypnea.     Breath sounds: Wheezing present.  Chest:     Chest wall: No tenderness.  Abdominal:     General: Bowel sounds are normal.     Palpations: Abdomen is soft.  Musculoskeletal:     Cervical back: Normal range of motion.     Right lower leg: No edema.     Left lower leg: No edema.     Comments: Decreased ROM noted generalized.   Skin:    General: Skin is warm and dry.     Capillary Refill: Capillary refill takes less than 2 seconds.  Neurological:     General: No focal deficit present.     Mental Status: He is alert and oriented to person, place, and time.     Sensory: No sensory deficit.     Motor: Weakness present.     Coordination: Coordination normal.  Gait: Gait abnormal.  Psychiatric:        Mood and Affect: Mood normal.     PLAN Problem List Items Addressed This Visit     CAD (coronary artery disease), native coronary artery (Chronic)    Plan: - Continue monitoring heart function and overall health. - Encourage adherence to prescribed medications and lifestyle modifications.      Diabetes mellitus with stage 3 chronic kidney disease, with long-term current use of insulin (HCC)    Plan: - Continue monitoring blood sugar levels regularly. -  Encourage adherence to prescribed medications and lifestyle modifications. - Monitor for any complications related to diabetes, including cold feet and neuropathy.      Anxiety    Monitoring patient's stress levels and ability to cope with daily activities. Plan: - Encourage seeking support from family and friends. - Consider referral to mental health services if needed.      Diabetic polyneuropathy associated with type 2 diabetes mellitus (HCC)    Plan: - Monitor pulses in lower extremities. - Refer to podiatry for evaluation and management of foot issues, including toenail care.      Weakness of both lower extremities    Evaluating potential causes of leg weakness and pain, including vitamin deficiencies and muscle strain. No clear cause present at this time. He does have multiple co-morbidities including iron deficiency, chronic pain, diabetes, CVD, and respiratory issues that could very well be playing a role in his symptoms.  Plan: - Check vitamin B12 and vitamin D levels. - Administer vitamin B12 shot and monitor for improvement in symptoms. - If vitamin D levels are low, consider prescription-strength supplementation. - Continue with iron supplementation for anemia. - Monitor BG closely and work to keep in tight control.       Relevant Medications   cyanocobalamin (VITAMIN B12) injection 1,000 mcg   Other Relevant Orders   Vitamin B12 (Completed)   VITAMIN D 25 Hydroxy (Vit-D Deficiency, Fractures) (Completed)   Pain in both lower extremities   Relevant Medications   cyanocobalamin (VITAMIN B12) injection 1,000 mcg   Other Relevant Orders   Vitamin B12 (Completed)   VITAMIN D 25 Hydroxy (Vit-D Deficiency, Fractures) (Completed)   B12 deficiency - Primary   Relevant Medications   cyanocobalamin (VITAMIN B12) injection 1,000 mcg   Other Relevant Orders   Vitamin B12 (Completed)   Elevated alkaline phosphatase level   Relevant Orders   VITAMIN D 25 Hydroxy (Vit-D  Deficiency, Fractures) (Completed)   Peripheral vascular disease (HCC)   Chronic heart failure (HCC)   Other Visit Diagnoses     Long toenail       Relevant Orders   Ambulatory referral to Podiatry   Other disorders of phosphorus metabolism       Relevant Orders   VITAMIN D 25 Hydroxy (Vit-D Deficiency, Fractures) (Completed)   COPD mixed type (HCC)   (Chronic)         Return in about 3 months (around 11/17/2022). Time: 30 minutes, >50% spent counseling, care coordination, chart review, and documentation.    [name], DNP, AGNP-c 08/17/2022 10:28 AM

## 2022-08-17 NOTE — Patient Instructions (Signed)
We will check your B12 level today and if it is still low we will plan to give you another shot in a month.

## 2022-08-18 LAB — VITAMIN D 25 HYDROXY (VIT D DEFICIENCY, FRACTURES): Vit D, 25-Hydroxy: 51.4 ng/mL (ref 30.0–100.0)

## 2022-08-18 LAB — VITAMIN B12: Vitamin B-12: 458 pg/mL (ref 232–1245)

## 2022-08-22 NOTE — Telephone Encounter (Signed)
done

## 2022-08-23 ENCOUNTER — Ambulatory Visit (INDEPENDENT_AMBULATORY_CARE_PROVIDER_SITE_OTHER): Payer: Medicare Other | Admitting: Podiatry

## 2022-08-23 ENCOUNTER — Encounter: Payer: Self-pay | Admitting: Podiatry

## 2022-08-23 DIAGNOSIS — M205X1 Other deformities of toe(s) (acquired), right foot: Secondary | ICD-10-CM

## 2022-08-23 DIAGNOSIS — M79674 Pain in right toe(s): Secondary | ICD-10-CM

## 2022-08-23 DIAGNOSIS — E1142 Type 2 diabetes mellitus with diabetic polyneuropathy: Secondary | ICD-10-CM

## 2022-08-23 DIAGNOSIS — M79675 Pain in left toe(s): Secondary | ICD-10-CM

## 2022-08-23 DIAGNOSIS — B351 Tinea unguium: Secondary | ICD-10-CM | POA: Diagnosis not present

## 2022-08-23 HISTORY — DX: Tinea unguium: M79.674

## 2022-08-23 HISTORY — DX: Tinea unguium: B35.1

## 2022-08-23 NOTE — Progress Notes (Signed)
This patient presents to the office for evaluation of his diabetic feet.  He has history of diabetes with kidney disease.  He says the big toenails on both feet are thick and painful.  He also says he has pain all through his right foot.  He says he dropped an object on his big toe joint.  He presents for an evaluation and treatment.  Vascular  Dorsalis pedis and posterior tibial pulses are  weakly palpable  B/L.  Capillary return  WNL.  Temperature gradient is  WNL.  Skin turgor  WNL  Sensorium  Senn Weinstein monofilament wire  WNL. Normal tactile sensation.  Nail Exam  Patient has thick disfigured discolored nails  big toes  B/L  Orthopedic  Exam  Muscle tone and muscle strength  WNL.  No limitations of motion feet  B/L.  No crepitus or joint effusion noted.  Foot type is unremarkable and digits show no abnormalities. He has limited ROM 1st MPJ  right foot.  Bony exostosis 1st MPJ  right foot.  Skin  No open lesions.  Normal skin texture and turgor.   Onychomycosis  Hallux  B/L  Hallux limitus 1st MPJ  right foot.  IE.  Debride nails  with nau=il nipper and dremel tool.  Discussed his hallux limitus 1st MPJ right foot.  To refer to Dr.  Allena Katz for surgical consult.  Told patient he needs probable vascular studies.  Helane Gunther DPM

## 2022-08-29 ENCOUNTER — Ambulatory Visit (INDEPENDENT_AMBULATORY_CARE_PROVIDER_SITE_OTHER): Payer: Medicare Other | Admitting: *Deleted

## 2022-08-29 DIAGNOSIS — J449 Chronic obstructive pulmonary disease, unspecified: Secondary | ICD-10-CM

## 2022-08-29 DIAGNOSIS — E1122 Type 2 diabetes mellitus with diabetic chronic kidney disease: Secondary | ICD-10-CM

## 2022-08-29 DIAGNOSIS — E1159 Type 2 diabetes mellitus with other circulatory complications: Secondary | ICD-10-CM

## 2022-08-29 NOTE — Patient Instructions (Signed)
Please call the care guide team at [phone] if you need to cancel or reschedule your appointment.   If you are experiencing a Mental Health or Behavioral Health Crisis or need someone to talk to, please call the Suicide and Crisis Lifeline: 988 call the [place] National Suicide Prevention Lifeline: 321-688-9760 or TTY: (408)480-8690 TTY 309-335-1077) to talk to a trained counselor call 1-800-273-TALK (toll free, 24 hour hotline) go to [facility] [address], [place] [phone]) call 911   Following is a copy of the CCM Program Consent:  CCM service includes personalized support from designated clinical staff supervised by the physician, including individualized plan of care and coordination with other care providers 24/7 contact phone numbers for assistance for urgent and routine care needs. Service will only be billed when office clinical staff spend 20 minutes or more in a month to coordinate care. Only one practitioner may furnish and bill the service in a calendar month. The patient may stop CCM services at amy time (effective at the end of the month) by phone call to the office staff. The patient will be responsible for cost sharing (co-pay) or up to 20% of the service fee (after annual deductible is met)  Following is a copy of your full provider care plan:   Goals Addressed             This Visit's Progress    CCM (COPD) EXPECTED OUTCOME: MONITOR, SELF-MANAGE AND REDUCE SYMTOM OF COPD       Current Barriers:  Knowledge Deficits related to COPD management Chronic Disease Management support and education needs related to COPD, action plan, falls Financial Constraints. - Child psychotherapist has worked with pt Patient reports he has all medications and taking as prescribed Patient has shower seat, walker, cane, has relatives he can call on if needed Patient has had several falls this past year due to having balance issues at times, no falls  since last conversation  Planned Interventions: Advised patient to track and manage COPD triggers Provided instruction about proper use of medications used for management of COPD including inhalers Advised patient to self assesses COPD action plan zone and make appointment with provider if in the yellow zone for 48 hours without improvement Advised patient to engage in light exercise as tolerated 3-5 days a week to aid in the the management of COPD Provided education about and advised patient to utilize infection prevention strategies to reduce risk of respiratory infection Discussed the importance of adequate rest and management of fatigue with COPD Reinforced safety precautions  Symptom Management: Take medications as prescribed   Attend all scheduled provider appointments Call pharmacy for medication refills 3-7 days in advance of running out of medications Attend church or other social activities Call provider office for new concerns or questions  identify and remove indoor air pollutants limit outdoor activity during cold weather listen for public air quality announcements every day do breathing exercises every day develop a rescue plan eliminate symptom triggers at home follow rescue plan if symptoms flare-up get at least 7 to 8 hours of sleep at night practice relaxation or meditation daily Follow COPD action plan- call your doctor early on for change in health status fall prevention strategies: change position slowly, use assistive device such as walker or cane (per provider recommendations) when walking, keep walkways clear, have good lighting in room. It is important to contact your provider if you have any falls, maintain muscle strength/tone by exercise per provider recommendations.  Follow Up Plan: Telephone follow up appointment with care management team member scheduled for:  11/27/22 at 215 pm       CCM (DIABETES) EXPECTED OUTCOME: MONITOR, SELF-MANAGE AND REDUCE  SYMPTOMS OF DIABETES       Current Barriers:  Knowledge Deficits related to Diabetes management Care Coordination needs related to social work needs in a patient with Diabetes Chronic Disease Management support and education needs related to Diabetes, diet Financial Constraints. - Child psychotherapist and care guide have been working with pt  Patient reports CBG is checked with continuous glucose monitor, fasting ranges 150's,  random ranges 100's, reports he has a friend that moved in with him and is cooking healthy meals, feels like he has improvement in blood sugar due to eating healthy meals Patient reports he has low energy and does not exercise, has walker and cane and uses as needed  Planned Interventions: Provided education to patient about basic DM disease process; Reviewed medications with patient and discussed importance of medication adherence;        Counseled on importance of regular laboratory monitoring as prescribed;        Provided patient with written educational materials related to hypo and hyperglycemia and importance of correct treatment;       Advised patient, providing education and rationale, to check cbg CGM per pt  and record        Review of patient status, including review of consultants reports, relevant laboratory and other test results, and medications completed;       Reviewed safety precautions  Symptom Management: Take medications as prescribed   Attend all scheduled provider appointments Call pharmacy for medication refills 3-7 days in advance of running out of medications Attend church or other social activities Call provider office for new concerns or questions  Work with the social worker to address care coordination needs and will continue to work with the clinical team to address health care and disease management related needs check blood sugar at prescribed times: continuous glucose monitor per patient report  check feet daily for cuts, sores or  redness enter blood sugar readings and medication or insulin into daily log take the blood sugar log to all doctor visits take the blood sugar meter to all doctor visits trim toenails straight across fill half of plate with vegetables limit fast food meals to no more than 1 per week manage portion size prepare main meal at home 3 to 5 days each week read food labels for fat, fiber, carbohydrates and portion size set a realistic goal keep feet up while sitting  Follow Up Plan: Telephone follow up appointment with care management team member scheduled for:  11/27/22 at 215 pm       CCM (HYPERTENSION) EXPECTED OUTCOME: MONITOR, SELF-MANAGE AND REDUCE SYMPTOMS OF HYPERTENSION       Current Barriers:  Knowledge Deficits related to Hypertension management Chronic Disease Management support and education needs related to Hypertension, diet Financial Constraints.  Patient reports he has blood pressure cuff but does not monitor blood pressure  Planned Interventions: Evaluation of current treatment plan related to hypertension self management and patient's adherence to plan as established by provider;   Reviewed medications with patient and discussed importance of compliance;  Counseled on the importance of exercise goals with target of 150 minutes per week Discussed plans with patient for ongoing care management follow up and provided patient with direct contact information for care management team; Advised patient, providing education and rationale, to monitor  blood pressure daily and record, calling PCP for findings outside established parameters;  Discussed complications of poorly controlled blood pressure such as heart disease, stroke, circulatory complications, vision complications, kidney impairment, sexual dysfunction;  Reinforced low sodium diet and importance of reading food labels  Symptom Management: Take medications as prescribed   Attend all scheduled provider appointments Call  pharmacy for medication refills 3-7 days in advance of running out of medications Attend church or other social activities Call provider office for new concerns or questions  check blood pressure weekly choose a place to take my blood pressure (home, clinic or office, retail store) write blood pressure results in a log or diary learn about high blood pressure keep a blood pressure log take blood pressure log to all doctor appointments keep all doctor appointments take medications for blood pressure exactly as prescribed report new symptoms to your doctor eat more whole grains, fruits and vegetables, lean meats and healthy fats Follow low sodium diet Read food labels for sodium content  Follow Up Plan: Telephone follow up appointment with care management team member scheduled for:  11/27/22 at 215 pm          The patient verbalized understanding of instructions, educational materials, and care plan provided today and DECLINED offer to receive copy of patient instructions, educational materials, and care plan.  Telephone follow up appointment with care management team member scheduled for:  11/27/22 at 215 pm

## 2022-08-29 NOTE — Chronic Care Management (AMB) (Signed)
Chronic Care Management   CCM RN Visit Note  08/29/2022 Name: Paul [name] MRN: [id number] DOB: 1951-02-01  Subjective: Paul [name] is a 72 y.o. year old male who is a primary care patient of Early, Sung Amabile, NP. The patient was referred to the Chronic Care Management team for assistance with care management needs subsequent to provider initiation of CCM services and plan of care.    Today's Visit:  Engaged with patient by telephone for follow up visit.        Goals Addressed             This Visit's Progress    CCM (COPD) EXPECTED OUTCOME: MONITOR, SELF-MANAGE AND REDUCE SYMTOM OF COPD       Current Barriers:  Knowledge Deficits related to COPD management Chronic Disease Management support and education needs related to COPD, action plan, falls Financial Constraints. - Child psychotherapist has worked with pt Patient reports he has all medications and taking as prescribed Patient has shower seat, walker, cane, has relatives he can call on if needed Patient has had several falls this past year due to having balance issues at times, no falls since last conversation  Planned Interventions: Advised patient to track and manage COPD triggers Provided instruction about proper use of medications used for management of COPD including inhalers Advised patient to self assesses COPD action plan zone and make appointment with provider if in the yellow zone for 48 hours without improvement Advised patient to engage in light exercise as tolerated 3-5 days a week to aid in the the management of COPD Provided education about and advised patient to utilize infection prevention strategies to reduce risk of respiratory infection Discussed the importance of adequate rest and management of fatigue with COPD Reinforced safety precautions  Symptom Management: Take medications as prescribed   Attend all scheduled provider appointments Call pharmacy for medication refills 3-7 days in advance of running out of  medications Attend church or other social activities Call provider office for new concerns or questions  identify and remove indoor air pollutants limit outdoor activity during cold weather listen for public air quality announcements every day do breathing exercises every day develop a rescue plan eliminate symptom triggers at home follow rescue plan if symptoms flare-up get at least 7 to 8 hours of sleep at night practice relaxation or meditation daily Follow COPD action plan- call your doctor early on for change in health status fall prevention strategies: change position slowly, use assistive device such as walker or cane (per provider recommendations) when walking, keep walkways clear, have good lighting in room. It is important to contact your provider if you have any falls, maintain muscle strength/tone by exercise per provider recommendations.  Follow Up Plan: Telephone follow up appointment with care management team member scheduled for:  11/27/22 at 215 pm       CCM (DIABETES) EXPECTED OUTCOME: MONITOR, SELF-MANAGE AND REDUCE SYMPTOMS OF DIABETES       Current Barriers:  Knowledge Deficits related to Diabetes management Care Coordination needs related to social work needs in a patient with Diabetes Chronic Disease Management support and education needs related to Diabetes, diet Financial Constraints. - Child psychotherapist and care guide have been working with pt  Patient reports CBG is checked with continuous glucose monitor, fasting ranges 150's,  random ranges 100's, reports he has a friend that moved in with him and is cooking healthy meals, feels like he has improvement in blood sugar due to eating healthy meals Patient reports  he has low energy and does not exercise, has walker and cane and uses as needed  Planned Interventions: Provided education to patient about basic DM disease process; Reviewed medications with patient and discussed importance of medication adherence;         Counseled on importance of regular laboratory monitoring as prescribed;        Provided patient with written educational materials related to hypo and hyperglycemia and importance of correct treatment;       Advised patient, providing education and rationale, to check cbg CGM per pt  and record        Review of patient status, including review of consultants reports, relevant laboratory and other test results, and medications completed;       Reviewed safety precautions  Symptom Management: Take medications as prescribed   Attend all scheduled provider appointments Call pharmacy for medication refills 3-7 days in advance of running out of medications Attend church or other social activities Call provider office for new concerns or questions  Work with the social worker to address care coordination needs and will continue to work with the clinical team to address health care and disease management related needs check blood sugar at prescribed times: continuous glucose monitor per patient report  check feet daily for cuts, sores or redness enter blood sugar readings and medication or insulin into daily log take the blood sugar log to all doctor visits take the blood sugar meter to all doctor visits trim toenails straight across fill half of plate with vegetables limit fast food meals to no more than 1 per week manage portion size prepare main meal at home 3 to 5 days each week read food labels for fat, fiber, carbohydrates and portion size set a realistic goal keep feet up while sitting  Follow Up Plan: Telephone follow up appointment with care management team member scheduled for:  11/27/22 at 215 pm       CCM (HYPERTENSION) EXPECTED OUTCOME: MONITOR, SELF-MANAGE AND REDUCE SYMPTOMS OF HYPERTENSION       Current Barriers:  Knowledge Deficits related to Hypertension management Chronic Disease Management support and education needs related to Hypertension, diet Financial Constraints.   Patient reports he has blood pressure cuff but does not monitor blood pressure  Planned Interventions: Evaluation of current treatment plan related to hypertension self management and patient's adherence to plan as established by provider;   Reviewed medications with patient and discussed importance of compliance;  Counseled on the importance of exercise goals with target of 150 minutes per week Discussed plans with patient for ongoing care management follow up and provided patient with direct contact information for care management team; Advised patient, providing education and rationale, to monitor blood pressure daily and record, calling PCP for findings outside established parameters;  Discussed complications of poorly controlled blood pressure such as heart disease, stroke, circulatory complications, vision complications, kidney impairment, sexual dysfunction;  Reinforced low sodium diet and importance of reading food labels  Symptom Management: Take medications as prescribed   Attend all scheduled provider appointments Call pharmacy for medication refills 3-7 days in advance of running out of medications Attend church or other social activities Call provider office for new concerns or questions  check blood pressure weekly choose a place to take my blood pressure (home, clinic or office, retail store) write blood pressure results in a log or diary learn about high blood pressure keep a blood pressure log take blood pressure log to all doctor appointments keep all doctor  appointments take medications for blood pressure exactly as prescribed report new symptoms to your doctor eat more whole grains, fruits and vegetables, lean meats and healthy fats Follow low sodium diet Read food labels for sodium content  Follow Up Plan: Telephone follow up appointment with care management team member scheduled for:  11/27/22 at 215 pm          Plan:Telephone follow up appointment with  care management team member scheduled for:  11/27/22 at 215 pm  Irving Shows [facility], BSN RN Case Manager [facility] [phone]

## 2022-09-01 ENCOUNTER — Ambulatory Visit (INDEPENDENT_AMBULATORY_CARE_PROVIDER_SITE_OTHER): Payer: Medicare Other | Admitting: Podiatry

## 2022-09-01 ENCOUNTER — Ambulatory Visit (INDEPENDENT_AMBULATORY_CARE_PROVIDER_SITE_OTHER): Payer: Medicare Other

## 2022-09-01 ENCOUNTER — Telehealth: Payer: Self-pay | Admitting: Urology

## 2022-09-01 DIAGNOSIS — M19071 Primary osteoarthritis, right ankle and foot: Secondary | ICD-10-CM

## 2022-09-01 DIAGNOSIS — M205X1 Other deformities of toe(s) (acquired), right foot: Secondary | ICD-10-CM

## 2022-09-01 DIAGNOSIS — Z01818 Encounter for other preprocedural examination: Secondary | ICD-10-CM | POA: Diagnosis not present

## 2022-09-01 NOTE — Telephone Encounter (Signed)
INFORMED PT THAT HE NEEDED TO STOP METFORMIN 2 DAYS PRIOR TO SX. HIGHLIGHTED THAT INFORMATION ON THE FORM THAT I GAVE THE PT ALONG WITH HIGHLIGHTED WHEN HE NEEDED TO STOP THE ASPIRIN. PT STATED UNDERSTANDING.

## 2022-09-01 NOTE — Assessment & Plan Note (Signed)
>>  ASSESSMENT AND PLAN FOR WEAKNESS OF BOTH LOWER EXTREMITIES WRITTEN ON 09/01/2022  7:14 PM BY Laya Letendre E, NP  Evaluating potential causes of leg weakness and pain, including vitamin deficiencies and muscle strain. No clear cause present at this time. He does have multiple co-morbidities including iron deficiency, chronic pain, diabetes, CVD, and respiratory issues that could very well be playing a role in his symptoms.  Plan: - Check vitamin B12 and vitamin D levels. - Administer vitamin B12 shot and monitor for improvement in symptoms. - If vitamin D levels are low, consider prescription-strength supplementation. - Continue with iron supplementation for anemia. - Monitor BG closely and work to keep in tight control.

## 2022-09-01 NOTE — Assessment & Plan Note (Signed)
Plan: - Monitor pulses in lower extremities. - Refer to podiatry for evaluation and management of foot issues, including toenail care.

## 2022-09-01 NOTE — Assessment & Plan Note (Signed)
Plan: - Continue monitoring heart function and overall health. - Encourage adherence to prescribed medications and lifestyle modifications.

## 2022-09-01 NOTE — Assessment & Plan Note (Signed)
Plan: - Continue monitoring blood sugar levels regularly. - Encourage adherence to prescribed medications and lifestyle modifications. - Monitor for any complications related to diabetes, including cold feet and neuropathy.

## 2022-09-01 NOTE — Assessment & Plan Note (Signed)
Evaluating potential causes of leg weakness and pain, including vitamin deficiencies and muscle strain. No clear cause present at this time. He does have multiple co-morbidities including iron deficiency, chronic pain, diabetes, CVD, and respiratory issues that could very well be playing a role in his symptoms.  Plan: - Check vitamin B12 and vitamin D levels. - Administer vitamin B12 shot and monitor for improvement in symptoms. - If vitamin D levels are low, consider prescription-strength supplementation. - Continue with iron supplementation for anemia. - Monitor BG closely and work to keep in tight control.

## 2022-09-01 NOTE — Progress Notes (Signed)
Subjective:  Patient ID: Paul [name], male    DOB: [birth year],  MRN: [id number]  Chief Complaint  Patient presents with   Toe Pain    72 y.o. male presents with the above complaint.  Patient presents with right severe first metatarsophalangeal joint arthrodesis patient states pain for touch is progressive gotten worse he is a diabetic with last A1c of 8%.  He has not seen anyone else prior to seeing me denies any other acute issues.  He is tried shoe gear modification previous injection he was treated by Dr. Stacie Acres.  He would like to discuss surgical options at this time   Review of Systems: Negative except as noted in the HPI. Denies N/V/F/Ch.  Past Medical History:  Diagnosis Date   CAD 11/24/2011   a) Mild-to-moderate 30-40% lesions in the RCA, LAD and Circumflex. b) CULPRIT LESION: long tubular 70-80% lesion in D1 with FFR of 0.7 --> PCI w/ Xience Xpedition DES 2.5 mm x 30 mm (2.65 MM); c) Lexiscan Myoview 11/2013: No Ischemia or Infarct (Inferior Gut Attenuation) EF 63%.   Chronic fatigue syndrome 06/13/2021   Constipation by delayed colonic transit 06/13/2021   COPD (chronic obstructive pulmonary disease) (HCC)    "don't have full case of it; I'm right there at it"   Diabetes mellitus without complication (HCC)    Dysphagia    Essential hypertension 11/24/2011   Exertional dyspnea, chronic    Gout    Hyperglycemia 03/11/2019   Hyperlipidemia with target LDL less than 70 11/24/2011   Hypo-osmolality and hyponatremia 03/11/2019   Long term (current) use of insulin (HCC) 07/15/2019   Obesity (BMI 30.0-34.9)    OSA (obstructive sleep apnea), uses oxygen at home did not tolerate cpap 11/24/2011   Paresthesia of skin 02/17/2015   S/P CABG (coronary artery bypass graft)     Current Outpatient Medications:    acetaminophen (TYLENOL) 500 MG tablet, Take 1,500-2,000 mg by mouth daily., Disp: , Rfl:    albuterol (VENTOLIN HFA) 108 (90 Base) MCG/ACT inhaler, Inhale 2 puffs into the  lungs every 4 (four) hours as needed. For breathing., Disp: 18 g, Rfl: 3   allopurinol (ZYLOPRIM) 100 MG tablet, Take 1 tablet (100 mg total) by mouth daily., Disp: 90 tablet, Rfl: 3   AMBULATORY NON FORMULARY MEDICATION, Medical alert device as covered by insurance for frequent falls and weakness., Disp: 1 each, Rfl: 0   amLODipine (NORVASC) 5 MG tablet, Take 1 tablet (5 mg total) by mouth daily., Disp: 90 tablet, Rfl: 3   aspirin 81 MG EC tablet, Take 1 tablet (81 mg total) by mouth daily., Disp: 30 tablet, Rfl: 0   Continuous Glucose Sensor (FREESTYLE LIBRE 3 SENSOR) MISC, , Disp: , Rfl:    gabapentin (NEURONTIN) 300 MG capsule, Take 2 capsules (600 mg total) by mouth 3 (three) times daily. For back and foot pain., Disp: 540 capsule, Rfl: 3   glucose blood (ONETOUCH VERIO) test strip, Use to check blood glucose 3 times daily, Disp: 300 each, Rfl: 3   insulin glargine, 1 Unit Dial, (TOUJEO SOLOSTAR) 300 UNIT/ML Solostar Pen, Inject 40 Units into the skin daily., Disp: 18 mL, Rfl: 3   insulin lispro (HUMALOG KWIKPEN) 200 UNIT/ML KwikPen, INJECT 14 UNITS UNDER THE SKIN EVERY MORNING, 14 UNITS WITH LUNCH AND 14 UNITS WITH SUPPER, DAILY, Disp: 12 mL, Rfl: 5   Insulin Pen Needle 32G X 4 MM MISC, Use daily with insulin as directed, Disp: 100 each, Rfl: 3   Iron, Ferrous Sulfate,  325 (65 Fe) MG TABS, Take 1 tablet (325 mg) by mouth daily., Disp: 30 tablet, Rfl: 3   metFORMIN (GLUCOPHAGE-XR) 500 MG 24 hr tablet, Take 2 tablets (1,000 mg total) by mouth 2 (two) times daily., Disp: 360 tablet, Rfl: 3   metoprolol tartrate (LOPRESSOR) 25 MG tablet, Take 1 tablet (25 mg total) by mouth 2 (two) times daily., Disp: 180 tablet, Rfl: 3   oxyCODONE-acetaminophen (PERCOCET/ROXICET) 5-325 MG tablet, Take 1-2 tablets by mouth in the morning, 1 tablet at noon and 1 tablet at bedtime as needed, Disp: 120 tablet, Rfl: 0   oxyCODONE-acetaminophen (PERCOCET/ROXICET) 5-325 MG tablet, Take 1-2 tablets by mouth in the morning,  1 tablet at noon and 1 tablet at bedtime as needed. (DNF 09/15/22), Disp: 120 tablet, Rfl: 0   pantoprazole (PROTONIX) 40 MG tablet, Take 1 tablet (40 mg total) by mouth daily., Disp: 90 tablet, Rfl: 3   potassium chloride (KLOR-CON M) 10 MEQ tablet, Take 1 tablet (10 mEq total) by mouth daily., Disp: 90 tablet, Rfl: 3   rosuvastatin (CRESTOR) 20 MG tablet, Take 1 tablet (20 mg total) by mouth daily., Disp: 90 tablet, Rfl: 3   Tetrahydrozoline HCl (VISINE EXTRA OP), Place 1 drop into both eyes as needed (itching)., Disp: , Rfl:    umeclidinium-vilanterol (ANORO ELLIPTA) 62.5-25 MCG/ACT AEPB, Inhale 1 puff into the lungs daily., Disp: 90 each, Rfl: 3   zolpidem (AMBIEN) 10 MG tablet, Take 1 tablet (10 mg total) by mouth at bedtime as needed for sleep, Disp: 30 tablet, Rfl: 0  Current Facility-Administered Medications:    cyanocobalamin (VITAMIN B12) injection 1,000 mcg, 1,000 mcg, Intramuscular, Q30 days, Early, Sung Amabile, NP, 1,000 mcg at 08/17/22 1126  Social History   Tobacco Use  Smoking Status Former   Packs/day: 1.00   Years: 40.00   Additional pack years: 0.00   Total pack years: 40.00   Types: Cigarettes   Quit date: 06/10/2016   Years since quitting: 6.2   Passive exposure: Past  Smokeless Tobacco Never  Tobacco Comments   02/18/14- smokes occ cig maybe 3 x per wk    Allergies  Allergen Reactions   Morphine And Codeine Itching    Possible itching due to morphine 01/26/21   Cortisone    Other Other (See Comments)    Cats- eyes burn   Objective:  There were no vitals filed for this visit. There is no height or weight on file to calculate BMI. Constitutional Well developed. Well nourished.  Vascular Dorsalis pedis pulses palpable bilaterally. Posterior tibial pulses palpable bilaterally. Capillary refill normal to all digits.  No cyanosis or clubbing noted. Pedal hair growth normal.  Neurologic Normal speech. Oriented to person, place, and time. Epicritic sensation to  light touch grossly present bilaterally.  Dermatologic Nails well groomed and normal in appearance. No open wounds. No skin lesions.  Orthopedic: Pain on palpation of right first metatarsophalangeal joint pain with range of motion of the joint limited range of motion noted at the first MPJ deep intra-articular pain noted.   Radiographs: 3 views of skeletally mature adult right foot: Severe arthritis noted of the first metatarsophalangeal joint.  No other bony abnormalities identified no fractures noted. Assessment:   1. Arthritis of first metatarsophalangeal (MTP) joint of right foot   2. Encounter for preoperative examination for general surgical procedure    Plan:  Patient was evaluated and treated and all questions answered.  Right first metatarsophalangeal joint arthritis -All questions and concerns were discussed with the  patient in extensive detail given the amount of pain that he is experiencing benefit from surgical fusion/arthrodesis of the first metatarsophalangeal joint -I discussed with the patient that since he is diabetic with A1c of 8% he is a high risk of wound complication and possible amputation he states understand would like to proceed with surgery despite the risks -I discussed my preoperative intra or postoperative plan in extensive detail patient states understanding and would like to proceed with surgery -Informed surgical risk consent was reviewed and read aloud to the patient.  I reviewed the films.  I have discussed my findings with the patient in great detail.  I have discussed all risks including but not limited to infection, stiffness, scarring, limp, disability, deformity, damage to blood vessels and nerves, numbness, poor healing, need for braces, arthritis, chronic pain, amputation, death.  All benefits and realistic expectations discussed in great detail.  I have made no promises as to the outcome.  I have provided realistic expectations.  I have offered the  patient a 2nd opinion, which they have declined and assured me they preferred to proceed despite the risks

## 2022-09-01 NOTE — Assessment & Plan Note (Signed)
Monitoring patient's stress levels and ability to cope with daily activities. Plan: - Encourage seeking support from family and friends. - Consider referral to mental health services if needed.

## 2022-09-06 ENCOUNTER — Other Ambulatory Visit: Payer: Self-pay

## 2022-09-06 ENCOUNTER — Inpatient Hospital Stay: Payer: Medicare Other | Attending: Physician Assistant

## 2022-09-06 ENCOUNTER — Other Ambulatory Visit: Payer: Self-pay | Admitting: Physician Assistant

## 2022-09-06 ENCOUNTER — Inpatient Hospital Stay (HOSPITAL_BASED_OUTPATIENT_CLINIC_OR_DEPARTMENT_OTHER): Payer: Medicare Other | Admitting: Physician Assistant

## 2022-09-06 VITALS — BP 129/63 | HR 71 | Temp 97.7°F | Resp 16 | Ht 65.0 in | Wt 187.9 lb

## 2022-09-06 DIAGNOSIS — F1090 Alcohol use, unspecified, uncomplicated: Secondary | ICD-10-CM | POA: Diagnosis not present

## 2022-09-06 DIAGNOSIS — K7460 Unspecified cirrhosis of liver: Secondary | ICD-10-CM | POA: Diagnosis not present

## 2022-09-06 DIAGNOSIS — D500 Iron deficiency anemia secondary to blood loss (chronic): Secondary | ICD-10-CM

## 2022-09-06 DIAGNOSIS — Z87891 Personal history of nicotine dependence: Secondary | ICD-10-CM | POA: Insufficient documentation

## 2022-09-06 DIAGNOSIS — K766 Portal hypertension: Secondary | ICD-10-CM | POA: Diagnosis not present

## 2022-09-06 DIAGNOSIS — E119 Type 2 diabetes mellitus without complications: Secondary | ICD-10-CM | POA: Insufficient documentation

## 2022-09-06 DIAGNOSIS — D696 Thrombocytopenia, unspecified: Secondary | ICD-10-CM | POA: Insufficient documentation

## 2022-09-06 DIAGNOSIS — D509 Iron deficiency anemia, unspecified: Secondary | ICD-10-CM | POA: Diagnosis not present

## 2022-09-06 DIAGNOSIS — R161 Splenomegaly, not elsewhere classified: Secondary | ICD-10-CM | POA: Insufficient documentation

## 2022-09-06 LAB — CBC WITH DIFFERENTIAL (CANCER CENTER ONLY)
Abs Immature Granulocytes: 0.02 10*3/uL (ref 0.00–0.07)
Basophils Absolute: 0.1 10*3/uL (ref 0.0–0.1)
Basophils Relative: 1 %
Eosinophils Absolute: 0.2 10*3/uL (ref 0.0–0.5)
Eosinophils Relative: 4 %
HCT: 41.5 % (ref 39.0–52.0)
Hemoglobin: 14 g/dL (ref 13.0–17.0)
Immature Granulocytes: 0 %
Lymphocytes Relative: 26 %
Lymphs Abs: 1.6 10*3/uL (ref 0.7–4.0)
MCH: 31.8 pg (ref 26.0–34.0)
MCHC: 33.7 g/dL (ref 30.0–36.0)
MCV: 94.3 fL (ref 80.0–100.0)
Monocytes Absolute: 0.4 10*3/uL (ref 0.1–1.0)
Monocytes Relative: 7 %
Neutro Abs: 3.8 10*3/uL (ref 1.7–7.7)
Neutrophils Relative %: 62 %
Platelet Count: 134 10*3/uL — ABNORMAL LOW (ref 150–400)
RBC: 4.4 MIL/uL (ref 4.22–5.81)
RDW: 12.6 % (ref 11.5–15.5)
WBC Count: 6.1 10*3/uL (ref 4.0–10.5)
nRBC: 0 % (ref 0.0–0.2)

## 2022-09-06 LAB — CMP (CANCER CENTER ONLY)
ALT: 37 U/L (ref 0–44)
AST: 48 U/L — ABNORMAL HIGH (ref 15–41)
Albumin: 4.2 g/dL (ref 3.5–5.0)
Alkaline Phosphatase: 111 U/L (ref 38–126)
Anion gap: 6 (ref 5–15)
BUN: 14 mg/dL (ref 8–23)
CO2: 23 mmol/L (ref 22–32)
Calcium: 9.1 mg/dL (ref 8.9–10.3)
Chloride: 110 mmol/L (ref 98–111)
Creatinine: 1.02 mg/dL (ref 0.61–1.24)
GFR, Estimated: >60 mL/min (ref >60)
Glucose, Bld: 131 mg/dL — ABNORMAL HIGH (ref 70–99)
Potassium: 4.6 mmol/L (ref 3.5–5.1)
Sodium: 139 mmol/L (ref 135–145)
Total Bilirubin: 0.6 mg/dL (ref 0.3–1.2)
Total Protein: 7.2 g/dL (ref 6.5–8.1)

## 2022-09-06 LAB — IRON AND IRON BINDING CAPACITY (CC-WL,HP ONLY)
Iron: 139 ug/dL (ref 45–182)
Saturation Ratios: 35 % (ref 17.9–39.5)
TIBC: 400 ug/dL (ref 250–450)
UIBC: 261 ug/dL (ref 117–376)

## 2022-09-06 LAB — FERRITIN: Ferritin: 37 ng/mL (ref 24–336)

## 2022-09-06 NOTE — Progress Notes (Signed)
[facility] [phone]   [phone]  PROGRESS NOTE  Patient Care Team: Early, Sung Amabile, NP as PCP - General (Nurse Practitioner) [place], [name], MD as PCP - Cardiology (Cardiology) Audrie Gallus, RN as [facility] Care Management  Hematological/Oncological History # Iron Deficiency Anemia of Unclear Etiology  03/02/2022: WBC 6.7, Hgb 12.0, Plt 187, ANC 4.2, Cr 1.01 03/08/2022: establish care with Dr. Leonides Schanz   Interval History:  Paul [name] 72 y.o. male returns for a follow up for iron deficiency anemia. He is unaccompanied for this visit.   On exam today Paul [name] reports that his energy and appetite are stable. He is having worsening left shoulder pain from arthritis. He takes percocet as needed with mild pain relief. He reports that he cannot receive steroid injections due to his diabetes. He is not interested in shoulder surgery at this time.   He is compliant with taking his iron pills and denies any signs of bleeding. He denies any nausea, vomiting or abdominal pain. His bowel habits are adequate without recurrent episodes of diarrhea or constipation. He has chronic shortness of breath with exertion. He denies fevers, chills, sweats, chest pain or cough.  A full 10 point ROS was otherwise negative.  MEDICAL HISTORY:  Past Medical History:  Diagnosis Date   CAD 11/24/2011   a) Mild-to-moderate 30-40% lesions in the RCA, LAD and Circumflex. b) CULPRIT LESION: long tubular 70-80% lesion in D1 with FFR of 0.7 --> PCI w/ Xience Xpedition DES 2.5 mm x 30 mm (2.65 MM); c) Lexiscan Myoview 11/2013: No Ischemia or Infarct (Inferior Gut Attenuation) EF 63%.   Chronic fatigue syndrome 06/13/2021   Constipation by delayed colonic transit 06/13/2021   COPD (chronic obstructive pulmonary disease) (HCC)    "don't have full case of it; I'm right there at it"   Diabetes mellitus without complication (HCC)    Dysphagia    Essential hypertension  11/24/2011   Exertional dyspnea, chronic    Gout    Hyperglycemia 03/11/2019   Hyperlipidemia with target LDL less than 70 11/24/2011   Hypo-osmolality and hyponatremia 03/11/2019   Long term (current) use of insulin (HCC) 07/15/2019   Obesity (BMI 30.0-34.9)    OSA (obstructive sleep apnea), uses oxygen at home did not tolerate cpap 11/24/2011   Paresthesia of skin 02/17/2015   S/P CABG (coronary artery bypass graft)     SURGICAL HISTORY: Past Surgical History:  Procedure Laterality Date   CERVICAL SPINE SURGERY  2012   CORONARY ANGIOPLASTY WITH STENT PLACEMENT  11/23/2011   "1; first one"   CORONARY ARTERY BYPASS GRAFT N/A 01/25/2021   Procedure: CORONARY ARTERY BYPASS GRAFTING (CABG) X2, USING LEFT INTERNAL MAMMARY ARTERY AND LEFT LEG GREATER SAPHENOUS VEIN HARVESTED ENDOSCOPICALLY;  Surgeon: Corliss Skains, MD;  Location: [facility] OR;  Service: Open Heart Surgery;  Laterality: N/A;   ENDOVEIN HARVEST OF GREATER SAPHENOUS VEIN Bilateral 01/25/2021   Procedure: ENDOVEIN HARVEST OF GREATER SAPHENOUS VEIN;  Surgeon: Corliss Skains, MD;  Location: [facility] OR;  Service: Open Heart Surgery;  Laterality: Bilateral;   LEFT HEART CATH AND CORONARY ANGIOGRAPHY N/A 12/28/2020   Procedure: LEFT HEART CATH AND CORONARY ANGIOGRAPHY;  Surgeon: Marykay Lex, MD;  Location: [facility] INVASIVE CV LAB;  Service: Cardiovascular;  Laterality: N/A;   LEFT HEART CATHETERIZATION WITH CORONARY ANGIOGRAM N/A 11/23/2011   Procedure: LEFT HEART CATHETERIZATION WITH CORONARY ANGIOGRAM;  Surgeon: Marykay Lex, MD;  Location: [facility] CATH LAB;  Service: Cardiovascular;  Laterality: N/A;   TEE WITHOUT CARDIOVERSION N/A 01/25/2021   Procedure: TRANSESOPHAGEAL ECHOCARDIOGRAM (TEE);  Surgeon: Corliss Skains, MD;  Location: [facility] OR;  Service: Open Heart Surgery;  Laterality: N/A;    SOCIAL HISTORY: Social History   Socioeconomic History   Marital status: Divorced    Spouse name: Not on file   Number of children: 3    Years of education: 11th   Highest education level: 11th grade  Occupational History   Not on file  Tobacco Use   Smoking status: Former    Packs/day: 1.00    Years: 40.00    Additional pack years: 0.00    Total pack years: 40.00    Types: Cigarettes    Quit date: 06/10/2016    Years since quitting: 6.2    Passive exposure: Past   Smokeless tobacco: Never   Tobacco comments:    02/18/14- smokes occ cig maybe 3 x per wk  Vaping Use   Vaping Use: Never used  Substance and Sexual Activity   Alcohol use: Yes    Alcohol/week: 0.0 standard drinks of alcohol    Comment: social 2-3 drink a week   Drug use: No   Sexual activity: Not Currently    Partners: Female  Other Topics Concern   Not on file  Social History Narrative   He is a 72 y.o. divorced father of 3, grandfather 1.   He is a retired Equities trader, former Nutritional therapist. 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   He quit smoking in August 2013, after smoking a pack a day for roughly 40 years.   He drinks socially 2-3 drinks a week only.   He does not get routine exercise, mostly due to 2 fatigue and dyspnea. Otherwise been relatively sedentary.   Social Determinants of Health   Financial Resource Strain: Medium Risk (05/18/2022)   Overall Financial Resource Strain (CARDIA)    Difficulty of Paying Living Expenses: Somewhat hard  Food Insecurity: Food Insecurity Present (06/20/2022)   Hunger Vital Sign    Worried About Running Out of Food in the Last Year: Often true    Ran Out of Food in the Last Year: Sometimes true  Transportation Needs: No Transportation Needs (05/18/2022)   PRAPARE - Administrator, Civil Service (Medical): No    Lack of Transportation (Non-Medical): No  Physical Activity: Inactive (05/18/2022)   Exercise Vital Sign    Days of Exercise per Week: 0 days    Minutes of Exercise per Session: 0 min  Stress: No  Stress Concern Present (05/18/2022)   [employer] of Occupational Health - Occupational Stress Questionnaire    Feeling of Stress : Not at all  Social Connections: Socially Isolated (05/18/2022)   Social Connection and Isolation Panel [NHANES]    Frequency of Communication with Friends and Family: More than three times a week    Frequency of Social Gatherings with Friends and Family: Once a week    Attends Religious Services: Never    Database administrator or Organizations: No    Attends Banker Meetings: Never    Marital Status: Divorced  Catering manager Violence: Not At Risk (05/18/2022)   Humiliation, Afraid, Rape, and Kick questionnaire    Fear of Current or Ex-Partner: No    Emotionally Abused: No    Physically Abused: No    Sexually Abused: No    FAMILY HISTORY: Family  History  Problem Relation Age of Onset   Heart disease Sister    Heart attack Brother    Emphysema Father        smoked   Aneurysm Father     ALLERGIES:  is allergic to morphine and codeine, cortisone, and other.  MEDICATIONS:  Current Outpatient Medications  Medication Sig Dispense Refill   acetaminophen (TYLENOL) 500 MG tablet Take 1,500-2,000 mg by mouth daily.     albuterol (VENTOLIN HFA) 108 (90 Base) MCG/ACT inhaler Inhale 2 puffs into the lungs every 4 (four) hours as needed. For breathing. 18 g 3   allopurinol (ZYLOPRIM) 100 MG tablet Take 1 tablet (100 mg total) by mouth daily. 90 tablet 3   amLODipine (NORVASC) 5 MG tablet Take 1 tablet (5 mg total) by mouth daily. 90 tablet 3   aspirin 81 MG EC tablet Take 1 tablet (81 mg total) by mouth daily. 30 tablet 0   Continuous Glucose Sensor (FREESTYLE LIBRE 3 SENSOR) MISC      gabapentin (NEURONTIN) 300 MG capsule Take 2 capsules (600 mg total) by mouth 3 (three) times daily. For back and foot pain. 540 capsule 3   glucose blood (ONETOUCH VERIO) test strip Use to check blood glucose 3 times daily 300 each 3   insulin glargine, 1 Unit  Dial, (TOUJEO SOLOSTAR) 300 UNIT/ML Solostar Pen Inject 40 Units into the skin daily. 18 mL 3   insulin lispro (HUMALOG KWIKPEN) 200 UNIT/ML KwikPen INJECT 14 UNITS UNDER THE SKIN EVERY MORNING, 14 UNITS WITH LUNCH AND 14 UNITS WITH SUPPER, DAILY 12 mL 5   Insulin Pen Needle 32G X 4 MM MISC Use daily with insulin as directed 100 each 3   Iron, Ferrous Sulfate, 325 (65 Fe) MG TABS Take 1 tablet (325 mg) by mouth daily. 30 tablet 3   metFORMIN (GLUCOPHAGE-XR) 500 MG 24 hr tablet Take 2 tablets (1,000 mg total) by mouth 2 (two) times daily. 360 tablet 3   metoprolol tartrate (LOPRESSOR) 25 MG tablet Take 1 tablet (25 mg total) by mouth 2 (two) times daily. 180 tablet 3   oxyCODONE-acetaminophen (PERCOCET/ROXICET) 5-325 MG tablet Take 1-2 tablets by mouth in the morning, 1 tablet at noon and 1 tablet at bedtime as needed 120 tablet 0   pantoprazole (PROTONIX) 40 MG tablet Take 1 tablet (40 mg total) by mouth daily. 90 tablet 3   potassium chloride (KLOR-CON M) 10 MEQ tablet Take 1 tablet (10 mEq total) by mouth daily. 90 tablet 3   rosuvastatin (CRESTOR) 20 MG tablet Take 1 tablet (20 mg total) by mouth daily. 90 tablet 3   Tetrahydrozoline HCl (VISINE EXTRA OP) Place 1 drop into both eyes as needed (itching).     umeclidinium-vilanterol (ANORO ELLIPTA) 62.5-25 MCG/ACT AEPB Inhale 1 puff into the lungs daily. 90 each 3   zolpidem (AMBIEN) 10 MG tablet Take 1 tablet (10 mg total) by mouth at bedtime as needed for sleep 30 tablet 0   AMBULATORY NON FORMULARY MEDICATION Medical alert device as covered by insurance for frequent falls and weakness. (Patient not taking: Reported on 08/29/2022) 1 each 0   doxycycline (VIBRA-TABS) 100 MG tablet Take 1 tablet (100 mg total) by mouth 2 (two) times daily. (Patient not taking: Reported on 08/17/2022) 14 tablet 0   furosemide (LASIX) 20 MG tablet Take 1 tablet (20 mg total) by mouth daily. (Patient not taking: Reported on 08/10/2022) 90 tablet 3   Current  Facility-Administered Medications  Medication Dose Route Frequency Provider  Last Rate Last Admin   cyanocobalamin (VITAMIN B12) injection 1,000 mcg  1,000 mcg Intramuscular Q30 days Early, Sung Amabile, NP   1,000 mcg at 08/17/22 1126    REVIEW OF SYSTEMS:   Constitutional: ( - ) fevers, ( - )  chills , ( - ) night sweats Eyes: ( - ) blurriness of vision, ( - ) double vision, ( - ) watery eyes Ears, nose, mouth, throat, and face: ( - ) mucositis, ( - ) sore throat Respiratory: ( - ) cough, ( - ) dyspnea, ( - ) wheezes Cardiovascular: ( - ) palpitation, ( - ) chest discomfort, ( - ) lower extremity swelling Gastrointestinal:  ( - ) nausea, ( - ) heartburn, ( - ) change in bowel habits Skin: ( - ) abnormal skin rashes Lymphatics: ( - ) new lymphadenopathy, ( - ) easy bruising Neurological: ( - ) numbness, ( - ) tingling, ( - ) new weaknesses Behavioral/Psych: ( - ) mood change, ( - ) new changes  All other systems were reviewed with the patient and are negative.  PHYSICAL EXAMINATION: ECOG PERFORMANCE STATUS: 1 - Symptomatic but completely ambulatory  Vitals:   09/06/22 1324  BP: 129/63  Pulse: 71  Resp: 16  Temp: 97.7 F (36.5 C)  SpO2: 100%   Filed Weights   09/06/22 1324  Weight: 187 lb 14.4 oz (85.2 kg)    GENERAL: Well-appearing elderly Caucasian male, alert, no distress and comfortable SKIN: skin color, texture, turgor are normal, no rashes or significant lesions EYES: conjunctiva are pink and non-injected, sclera clear LUNGS: clear to auscultation and percussion with normal breathing effort HEART: regular rate & rhythm and no murmurs and no lower extremity edema Musculoskeletal: no cyanosis of digits and no clubbing  PSYCH: alert & oriented x 3, fluent speech NEURO: no focal motor/sensory deficits  LABORATORY DATA:  I have reviewed the data as listed    Latest Ref Rng & Units 09/06/2022    1:02 PM 08/10/2022    3:25 PM 06/08/2022    1:56 PM  CBC  WBC 4.0 - 10.5 K/uL  6.1  7.4  6.2   Hemoglobin 13.0 - 17.0 g/dL 40.9  81.1  91.4   Hematocrit 39.0 - 52.0 % 41.5  42.9  39.6   Platelets 150 - 400 K/uL 134  135  149        Latest Ref Rng & Units 09/06/2022    1:02 PM 08/10/2022    3:25 PM 06/08/2022    1:56 PM  CMP  Glucose 70 - 99 mg/dL 782  956  213   BUN 8 - 23 mg/dL 14  16  15    Creatinine 0.61 - 1.24 mg/dL 0.86  5.78  4.69   Sodium 135 - 145 mmol/L 139  139  137   Potassium 3.5 - 5.1 mmol/L 4.6  4.5  4.5   Chloride 98 - 111 mmol/L 110  104  106   CO2 22 - 32 mmol/L 23  15  22    Calcium 8.9 - 10.3 mg/dL 9.1  9.5  9.7   Total Protein 6.5 - 8.1 g/dL 7.2  7.1  7.4   Total Bilirubin 0.3 - 1.2 mg/dL 0.6  0.5  0.6   Alkaline Phos 38 - 126 U/L 111  134  105   AST 15 - 41 U/L 48  51  39   ALT 0 - 44 U/L 37  41  23     Lab Results  Component Value Date   MPROTEIN Not Observed 03/08/2022   Lab Results  Component Value Date   KPAFRELGTCHN 46.6 (H) 03/08/2022   LAMBDASER 23.3 03/08/2022   KAPLAMBRATIO 2.00 (H) 03/08/2022    RADIOGRAPHIC STUDIES: No results found.  ASSESSMENT & PLAN Paul [name] 72 y.o. male with medical history significant for iron deficiency anemia who presents for a follow up visit.   # Iron Deficiency Anemia 2/2 to Unclear Etiology, Suspect GI Bleeding from cirrhosis -- Findings are consistent with iron deficiency anemia. Under the care of [facility]. -- Currently taking ferrous sulfate 325 mg. --Labs today show white blood cell count 6.2, hemoglobin 14.0, MCV 94.3, and platelets of 134.  Iron panel shows no deficiency with serum iron 139, saturation 35%, TIBC 400. Ferritin pending. --No need for IV iron at this time. Recommend to continue on PO iron supplementation --Plan for return to clinic in 6 months with repeat labs  #Cirrhosis/Splenomegaly/Portal hypertension: --Seen with CT abdomen/pelvis from 04/25/2022.  --Patient is not clear about diagnosis so we will request a follow up with patient's gastroenterologist at [facility].   #Thrombocytopenia: --Mild and likely secondary to splenomegaly --No overt signs of bleeding --Monitor for now.   No orders of the defined types were placed in this encounter.   All questions were answered. The patient knows to call the clinic with any problems, questions or concerns.  A total of more than 30 minutes were spent on this encounter with face-to-face time and non-face-to-face time, including preparing to see the patient, ordering tests and/or medications, counseling the patient and coordination of care as outlined above.   Georga Kaufmann PA-C Dept of Hematology and Oncology [facility] Phone: [phone]  09/06/2022 3:10 PM

## 2022-09-07 ENCOUNTER — Telehealth: Payer: Self-pay

## 2022-09-07 NOTE — Telephone Encounter (Signed)
Pt advised with VU 

## 2022-09-07 NOTE — Telephone Encounter (Signed)
-----   Message from [name], PA-C sent at 09/07/2022 10:06 AM EDT ----- Please notify patient that iron levels are normal but he will still need to take his iron pills daily.

## 2022-09-08 ENCOUNTER — Ambulatory Visit (INDEPENDENT_AMBULATORY_CARE_PROVIDER_SITE_OTHER): Payer: Medicare Other | Admitting: Nurse Practitioner

## 2022-09-08 ENCOUNTER — Encounter: Payer: Self-pay | Admitting: Nurse Practitioner

## 2022-09-08 ENCOUNTER — Telehealth: Payer: Self-pay | Admitting: Hematology and Oncology

## 2022-09-08 VITALS — BP 108/58 | HR 64 | Temp 97.9°F | Resp 15 | Wt 190.0 lb

## 2022-09-08 DIAGNOSIS — E1122 Type 2 diabetes mellitus with diabetic chronic kidney disease: Secondary | ICD-10-CM

## 2022-09-08 DIAGNOSIS — M4800 Spinal stenosis, site unspecified: Secondary | ICD-10-CM | POA: Diagnosis not present

## 2022-09-08 DIAGNOSIS — N1831 Chronic kidney disease, stage 3a: Secondary | ICD-10-CM | POA: Diagnosis not present

## 2022-09-08 DIAGNOSIS — R29898 Other symptoms and signs involving the musculoskeletal system: Secondary | ICD-10-CM

## 2022-09-08 DIAGNOSIS — I952 Hypotension due to drugs: Secondary | ICD-10-CM

## 2022-09-08 DIAGNOSIS — Z794 Long term (current) use of insulin: Secondary | ICD-10-CM | POA: Diagnosis not present

## 2022-09-08 NOTE — Patient Instructions (Addendum)
STOP Amlodipine- this may be causing your dizziness with your blood pressure getting too low  STOP Rosuvastatin for 2 weeks.  If legs are better then CALL ME If legs are not better then RESTART at 1/2 pill every day.   This will let us know if your pain is from this medication.    I will send a message to the Podiatrist to let him know you want to think about the surgery and you will call if you change your mind.

## 2022-09-08 NOTE — Progress Notes (Signed)
[name], DNP, AGNP-c [facility]  [address] [place], [place] [address] [phone]  ESTABLISHED PATIENT- Chronic Health and/or Follow-Up Visit  Blood pressure (!) 108/58, pulse 64, temperature 97.9 F (36.6 C), temperature source Oral, resp. rate 15, weight 190 lb (86.2 kg), SpO2 93 %.    Paul [name] is a 72 y.o. year old male presenting today for evaluation and surgical clearance/consult.   Amer reports experiencing episodes of dizziness, during which he needs to lean against a wall or sit down until the sensation passes. He describes these episodes as sudden and transient. He denies any CP, palpitations, or syncope.   He mentions that his blood sugar levels have improved, attributing this to better meals prepared by a friend who recently moved in with him. He checked his blood sugar before the appointment, noting it was 97, which he feels is low but acceptable.  Paul [name] is currently on amlodipine and metoprolol, medications for blood pressure and heart rate, respectively. He was unaware that amlodipine was a blood pressure medication. He expresses concern that his medications might be contributing to his symptoms of dizziness and low blood pressure, which was measured at 108 over 58 during the visit.  He also discusses severe leg cramps and weakness, particularly at night, which significantly impair his mobility and quality of life. He has started taking gabapentin, initially feeling drowsy but noting that his body adjusted to the medication. Despite this, his leg issues persist, impacting his ability to walk and perform daily activities.  Additionally, Paul [name] mentions his hydration habits, primarily drinking tea, and using non-sweetened sugar substitutes like Stevia in his diet.   He tells me he is here today for clearance for spinal stenosis surgery, however, he expresses a desire to delay a proposed medical procedure, preferring to wait and see if adjustments  to his current medications alleviate his symptoms. He explains that he is concerned about the risks associated with the surgery and the recovery.   All ROS negative with exception of what is listed above.   PHYSICAL EXAM Physical Exam Vitals and nursing note reviewed.  Constitutional:      Appearance: Normal appearance.  HENT:     Head: Normocephalic.  Eyes:     Pupils: Pupils are equal, round, and reactive to light.  Cardiovascular:     Rate and Rhythm: Normal rate and regular rhythm.     Pulses: Normal pulses.     Heart sounds: Normal heart sounds.  Pulmonary:     Effort: Pulmonary effort is normal.     Breath sounds: Normal breath sounds.  Abdominal:     General: There is distension.     Tenderness: There is no abdominal tenderness.  Musculoskeletal:        General: Normal range of motion.     Cervical back: Normal range of motion.     Right lower leg: Edema present.     Left lower leg: Edema present.  Skin:    General: Skin is warm.     Capillary Refill: Capillary refill takes less than 2 seconds.  Neurological:     General: No focal deficit present.     Mental Status: He is alert and oriented to person, place, and time.     Sensory: No sensory deficit.     Motor: Weakness present.     Gait: Gait abnormal.     Deep Tendon Reflexes: Reflexes normal.  Psychiatric:        Mood and Affect: Mood normal.  PLAN Problem List Items Addressed This Visit     Diabetes mellitus with stage 3 chronic kidney disease, with long-term current use of insulin (HCC)    Patient reports better blood sugar control with recent reading of 97, likely due to improved diet and support from a friend who moved in. Plan: - Encourage continued adherence to a healthy diet and regular blood sugar monitoring. - No changes to current diabetes management at this time.      Weakness of both lower extremities    Patient complains of significant leg pain, weakness, and cramping, affecting his  daily activities and mobility. Plan: - Discontinue rosuvastatin for two weeks to assess for potential statin-induced myopathy. - If leg pain resolves, patient to call the clinic and not restart the medication. - If leg pain persists, restart rosuvastatin at half the previous dose. - Encourage adequate hydration and consider electrolyte supplementation if cramping continues.      Hypotension due to drugs - Primary    Patient reports episodes of dizziness requiring him to brace himself or sit down. His blood pressure was measured at 108/58 during today's visit, which is on the lower side. No alarm symptoms present at this time.  Plan: - Discontinue amlodipine due to possible hypotensive episodes from this medication. - Patient to monitor blood pressure at home and report any significant changes or worsening of symptoms.      Spinal stenosis    Patient is unsure about proceeding with the lumbar spinal stenosis procedure recommended by Dr. Allena Katz. He requests to hold off on this procedure at this time. I have reassured him that there is no pressure to go through with the procedure.  Plan: - Inform Dr. Allena Katz of the patient's decision to hold off on the procedure for now. - Reevaluate the need for the procedure after addressing other potential causes of leg pain and weakness.       Return if symptoms worsen or fail to improve.   [name], DNP, AGNP-c 09/08/2022  2:47 PM

## 2022-09-13 ENCOUNTER — Other Ambulatory Visit (HOSPITAL_COMMUNITY): Payer: Self-pay

## 2022-09-13 MED ORDER — OXYCODONE-ACETAMINOPHEN 5-325 MG PO TABS
1.0000 | ORAL_TABLET | Freq: Three times a day (TID) | ORAL | 0 refills | Status: DC | PRN
  Filled 2022-09-15: qty 120, 30d supply, fill #0

## 2022-09-15 ENCOUNTER — Other Ambulatory Visit (HOSPITAL_COMMUNITY): Payer: Self-pay

## 2022-09-15 DIAGNOSIS — J449 Chronic obstructive pulmonary disease, unspecified: Secondary | ICD-10-CM

## 2022-09-15 DIAGNOSIS — I152 Hypertension secondary to endocrine disorders: Secondary | ICD-10-CM

## 2022-09-15 DIAGNOSIS — N1831 Chronic kidney disease, stage 3a: Secondary | ICD-10-CM

## 2022-09-15 DIAGNOSIS — E1122 Type 2 diabetes mellitus with diabetic chronic kidney disease: Secondary | ICD-10-CM

## 2022-09-15 DIAGNOSIS — E1159 Type 2 diabetes mellitus with other circulatory complications: Secondary | ICD-10-CM

## 2022-09-15 DIAGNOSIS — Z794 Long term (current) use of insulin: Secondary | ICD-10-CM

## 2022-09-18 ENCOUNTER — Other Ambulatory Visit (INDEPENDENT_AMBULATORY_CARE_PROVIDER_SITE_OTHER): Payer: Medicare Other

## 2022-09-18 DIAGNOSIS — E538 Deficiency of other specified B group vitamins: Secondary | ICD-10-CM

## 2022-09-18 MED ORDER — CYANOCOBALAMIN 1000 MCG/ML IJ SOLN
1000.0000 ug | Freq: Once | INTRAMUSCULAR | Status: AC
Start: 2022-09-18 — End: 2022-09-18
  Administered 2022-09-18: 1000 ug via INTRAMUSCULAR

## 2022-10-02 ENCOUNTER — Other Ambulatory Visit (HOSPITAL_COMMUNITY): Payer: Self-pay

## 2022-10-02 MED ORDER — HYDROCODONE-ACETAMINOPHEN 5-325 MG PO TABS
ORAL_TABLET | ORAL | 0 refills | Status: DC
  Filled 2022-10-31: qty 120, 30d supply, fill #0

## 2022-10-02 MED ORDER — HYDROCODONE-ACETAMINOPHEN 5-325 MG PO TABS
ORAL_TABLET | ORAL | 0 refills | Status: DC
  Filled 2022-12-01: qty 120, 30d supply, fill #0

## 2022-10-02 MED ORDER — ZTLIDO 1.8 % EX PTCH
1.0000 | MEDICATED_PATCH | Freq: Every day | CUTANEOUS | 2 refills | Status: DC
  Filled 2022-10-02: qty 30, 30d supply, fill #0

## 2022-10-02 MED ORDER — HYDROCODONE-ACETAMINOPHEN 5-325 MG PO TABS
ORAL_TABLET | ORAL | 0 refills | Status: DC
  Filled 2022-10-02: qty 120, 30d supply, fill #0

## 2022-10-09 ENCOUNTER — Encounter: Payer: Self-pay | Admitting: Nurse Practitioner

## 2022-10-09 DIAGNOSIS — I952 Hypotension due to drugs: Secondary | ICD-10-CM | POA: Insufficient documentation

## 2022-10-09 DIAGNOSIS — M4800 Spinal stenosis, site unspecified: Secondary | ICD-10-CM | POA: Insufficient documentation

## 2022-10-09 HISTORY — DX: Hypotension due to drugs: I95.2

## 2022-10-09 NOTE — Assessment & Plan Note (Signed)
>>  ASSESSMENT AND PLAN FOR WEAKNESS OF BOTH LOWER EXTREMITIES WRITTEN ON 10/09/2022  8:21 PM BY Emerly Prak E, NP  Patient complains of significant leg pain, weakness, and cramping, affecting his daily activities and mobility. Plan: - Discontinue rosuvastatin for two weeks to assess for potential statin-induced myopathy. - If leg pain resolves, patient to call the clinic and not restart the medication. - If leg pain persists, restart rosuvastatin at half the previous dose. - Encourage adequate hydration and consider electrolyte supplementation if cramping continues.

## 2022-10-09 NOTE — Assessment & Plan Note (Signed)
Patient complains of significant leg pain, weakness, and cramping, affecting his daily activities and mobility. Plan: - Discontinue rosuvastatin for two weeks to assess for potential statin-induced myopathy. - If leg pain resolves, patient to call the clinic and not restart the medication. - If leg pain persists, restart rosuvastatin at half the previous dose. - Encourage adequate hydration and consider electrolyte supplementation if cramping continues.

## 2022-10-09 NOTE — Assessment & Plan Note (Signed)
Patient is unsure about proceeding with the lumbar spinal stenosis procedure recommended by Dr. Allena Katz. He requests to hold off on this procedure at this time. I have reassured him that there is no pressure to go through with the procedure.  Plan: - Inform Dr. Allena Katz of the patient's decision to hold off on the procedure for now. - Reevaluate the need for the procedure after addressing other potential causes of leg pain and weakness.

## 2022-10-09 NOTE — Assessment & Plan Note (Signed)
Patient reports better blood sugar control with recent reading of 97, likely due to improved diet and support from a friend who moved in. Plan: - Encourage continued adherence to a healthy diet and regular blood sugar monitoring. - No changes to current diabetes management at this time.

## 2022-10-09 NOTE — Assessment & Plan Note (Signed)
Patient reports episodes of dizziness requiring him to brace himself or sit down. His blood pressure was measured at 108/58 during today's visit, which is on the lower side. No alarm symptoms present at this time.  Plan: - Discontinue amlodipine due to possible hypotensive episodes from this medication. - Patient to monitor blood pressure at home and report any significant changes or worsening of symptoms.

## 2022-10-10 ENCOUNTER — Telehealth: Payer: Self-pay | Admitting: Urology

## 2022-10-10 NOTE — Telephone Encounter (Addendum)
DOS - 11/06/22  HALLUX MPJ FUSION RIGHT --- [id number]  SPOKE WITH BETH WITH [employer]  AND SHE STATED THAT FOLLOW MEDICARE GUIDELINES NO AUTH WOULD BE REQUIRED.  CALL REF # [id number] M. 10/10/22 AT 2:39PM

## 2022-10-12 ENCOUNTER — Telehealth: Payer: Self-pay | Admitting: Nurse Practitioner

## 2022-10-12 ENCOUNTER — Other Ambulatory Visit (HOSPITAL_COMMUNITY): Payer: Self-pay

## 2022-10-12 DIAGNOSIS — F5104 Psychophysiologic insomnia: Secondary | ICD-10-CM

## 2022-10-12 MED ORDER — ZOLPIDEM TARTRATE 10 MG PO TABS
10.0000 mg | ORAL_TABLET | Freq: Every evening | ORAL | 2 refills | Status: DC | PRN
Start: 2022-10-12 — End: 2023-01-11
  Filled 2022-10-12 – 2022-10-20 (×3): qty 30, 30d supply, fill #0
  Filled 2022-11-16: qty 30, 30d supply, fill #1
  Filled 2022-12-15: qty 30, 30d supply, fill #2

## 2022-10-12 NOTE — Telephone Encounter (Signed)
Pt called and is requesting a refill for his ambien Please send to the [facility]

## 2022-10-18 ENCOUNTER — Other Ambulatory Visit (HOSPITAL_COMMUNITY): Payer: Self-pay

## 2022-10-20 ENCOUNTER — Other Ambulatory Visit (HOSPITAL_COMMUNITY): Payer: Self-pay

## 2022-10-20 ENCOUNTER — Other Ambulatory Visit: Payer: Self-pay

## 2022-10-20 MED ORDER — ROSUVASTATIN CALCIUM 20 MG PO TABS: 20.0000 mg | ORAL_TABLET | Freq: Every day | ORAL | 3 refills | Status: DC

## 2022-10-20 MED ORDER — POTASSIUM CHLORIDE CRYS ER 10 MEQ PO TBCR: 10.0000 meq | EXTENDED_RELEASE_TABLET | Freq: Every day | ORAL | 3 refills | Status: DC

## 2022-10-28 ENCOUNTER — Encounter: Payer: Self-pay | Admitting: Urgent Care

## 2022-10-30 ENCOUNTER — Inpatient Hospital Stay
Admission: RE | Admit: 2022-10-30 | Discharge: 2022-10-30 | Disposition: A | Discharge status: Home / Self Care | Payer: Medicare Other | Source: Ambulatory Visit

## 2022-10-30 ENCOUNTER — Other Ambulatory Visit (HOSPITAL_COMMUNITY): Payer: Self-pay

## 2022-10-30 ENCOUNTER — Telehealth: Payer: Self-pay | Admitting: Podiatry

## 2022-10-30 HISTORY — DX: Gastro-esophageal reflux disease without esophagitis: K21.9

## 2022-10-30 HISTORY — DX: Vitamin D deficiency, unspecified: E55.9

## 2022-10-30 HISTORY — DX: Unspecified osteoarthritis, unspecified site: M19.90

## 2022-10-30 HISTORY — DX: Chronic pain syndrome: G89.4

## 2022-10-30 HISTORY — DX: Unspecified cirrhosis of liver: K74.60

## 2022-10-30 HISTORY — DX: Depression, unspecified: F32.A

## 2022-10-30 HISTORY — DX: Anxiety disorder, unspecified: F41.9

## 2022-10-30 HISTORY — DX: Benign prostatic hyperplasia without lower urinary tract symptoms: N40.0

## 2022-10-30 HISTORY — DX: Personal history of nicotine dependence: Z87.891

## 2022-10-30 HISTORY — DX: Spinal stenosis, site unspecified: M48.00

## 2022-10-30 HISTORY — DX: Chronic kidney disease, stage 3 unspecified: N18.30

## 2022-10-30 HISTORY — DX: Personal history of other diseases of the digestive system: Z87.19

## 2022-10-30 HISTORY — DX: Unspecified hearing loss, unspecified ear: H91.90

## 2022-10-30 HISTORY — DX: Splenomegaly, not elsewhere classified: R16.1

## 2022-10-30 HISTORY — DX: Portal hypertension: K76.6

## 2022-10-30 HISTORY — DX: Thrombocytopenia, unspecified: D69.6

## 2022-10-30 HISTORY — DX: Angina pectoris, unspecified: I20.9

## 2022-10-30 HISTORY — DX: Lipoprotein deficiency: E78.6

## 2022-10-30 HISTORY — DX: Diverticulosis of intestine, part unspecified, without perforation or abscess without bleeding: K57.90

## 2022-10-30 HISTORY — DX: Iron deficiency anemia, unspecified: D50.9

## 2022-10-30 HISTORY — DX: Peripheral vascular disease, unspecified: I73.9

## 2022-10-30 HISTORY — DX: Occlusion and stenosis of bilateral carotid arteries: I65.23

## 2022-10-30 HISTORY — DX: Heart failure, unspecified: I50.9

## 2022-10-30 NOTE — Telephone Encounter (Signed)
Per PT and Dr Allena Katz, canceling surgery schedule on 11/06/22. PT would like to get better control of A1C before proceeding with surgery. Notified Dr Allena Katz and [facility]

## 2022-10-31 ENCOUNTER — Other Ambulatory Visit (HOSPITAL_COMMUNITY): Payer: Self-pay

## 2022-10-31 ENCOUNTER — Other Ambulatory Visit: Payer: Self-pay

## 2022-11-06 ENCOUNTER — Ambulatory Visit: Admit: 2022-11-06 | Payer: Medicare Other | Admitting: Podiatry

## 2022-11-06 SURGERY — FUSION, JOINT, GREAT TOE
Anesthesia: Choice | Site: Toe | Laterality: Right

## 2022-11-07 ENCOUNTER — Encounter: Payer: Self-pay | Admitting: Urgent Care

## 2022-11-07 ENCOUNTER — Other Ambulatory Visit (INDEPENDENT_AMBULATORY_CARE_PROVIDER_SITE_OTHER): Payer: Medicare Other

## 2022-11-07 ENCOUNTER — Telehealth: Payer: Self-pay | Admitting: *Deleted

## 2022-11-07 DIAGNOSIS — E538 Deficiency of other specified B group vitamins: Secondary | ICD-10-CM | POA: Diagnosis not present

## 2022-11-07 MED ORDER — CYANOCOBALAMIN 1000 MCG/ML IJ SOLN
1000.0000 ug | Freq: Once | INTRAMUSCULAR | Status: AC
Start: 2022-11-07 — End: 2022-11-07
  Administered 2022-11-07: 1000 ug via INTRAMUSCULAR

## 2022-11-07 NOTE — Progress Notes (Deleted)
Office Visit    Patient Name: Paul [name] Date of Encounter: 11/07/2022  PCP:  Tollie Eth, NP   [facility]  Cardiologist:   [place], MD  Advanced Practice Provider:  No care team member to display Electrophysiologist:  None }  HPI    Paul [name] is a 72 y.o. male with past medical history of CAD status PCI subsequent CABG 01/25/2021 (LIMA to LAD free graft, jump off vein graft proximally, R SVG to OM, type 2 diabetes mellitus, hyperlipidemia, COPD, HTN, OSA, and mild carotid artery disease presents today for pre op clearance for foot surgery.  Previously seen by Dr. Herbie Baltimore with history of CAD and PCI to D1 11/2011.  He was seen 12/13/2020 by me noting increasing precordial pain and exertional dyspnea.  Cardiac catheterization 12/28/2020 showed extensive LAD and circumflex disease with occluded diagonal and recommended for cardiothoracic surgery consult.  Underwent CABG x 2 on 01/25/2021 by Dr. Cliffton Asters.  Started on Flomax due to difficulty voiding.  On 01/30/2021 had an episode where he fell off of a chair after coughing with a small laceration to the nose but otherwise neurologically intact.  Head CT negative for intracranial injury.  Discharged to SNF.  Seen in follow-up 03/18/2022 with caretakers.  Had a viral illness and supportive care was recommended.  He was seen by Gillian Shields, NP 05/19/2021.  He was undergoing workup with Ortho and was pending neurosurgery for back and shoulder pain.  Working with physical therapy at home at the time.  Noted some "knots" under his chest wall which will likely keloid scarring.  Gradually increasing activity with no reported symptoms.  No changes were made to his treatment regimen he was advised to follow-up in 6 months.  He was seen by Eligha Bridegroom, NP 04/03/2022 for evaluation of shortness of breath and fatigue.  He could not complete his normal activities.  He endorsed exhaustion by walking to the office that day.   Additionally, activity was limited by chronic back pain.  He had mild bilateral lower extremity edema.  He felt his abdomen was more bloated as well.  He does try to limit salt and avoid sugar but is not strict about his diet. Echo and Myoview studies were done and were normal.   In review of his records it appears his statin was dropped off his list after his Rehab stay and never resumed. He still complains of lack of energy. Has to rest frequently. No chest pain or dyspnea. Sugars are elevated. Reports sugar typically around 200. Did have URI last month and was given a medrol dose pack.    Past Medical History    Past Medical History:  Diagnosis Date   Angina, class III (HCC)    Anxiety    Arthritis    Bilateral carotid artery stenosis    BPH without obstruction/lower urinary tract symptoms    CAD 11/24/2011   a) Mild-to-moderate 30-40% lesions in the RCA, LAD and Circumflex. b) CULPRIT LESION: long tubular 70-80% lesion in D1 with FFR of 0.7 --> PCI w/ Xience Xpedition DES 2.5 mm x 30 mm (2.65 MM); c) Lexiscan Myoview 11/2013: No Ischemia or Infarct (Inferior Gut Attenuation) EF 63%.   Chronic fatigue syndrome 06/13/2021   Chronic heart failure (HCC)    Chronic kidney disease (CKD), stage III (moderate) (HCC)    Chronic pain syndrome    Cigarette smoker 02/22/2014   Cirrhosis (HCC)    Constipation by delayed colonic transit 06/13/2021  COPD (chronic obstructive pulmonary disease) (HCC)    "don't have full case of it; I'm right there at it"   Depression    Diabetes mellitus without complication (HCC)    Diverticulosis    Dysphagia    Elevated alkaline phosphatase level 08/17/2022   Elevated levels of transaminase & lactic acid dehydrogenase 06/09/2016   Essential hypertension 11/24/2011   Exertional dyspnea, chronic    Former smoker    GERD (gastroesophageal reflux disease)    Gout    H/O: GI bleed    Hearing loss    Hyperglycemia 03/11/2019   Hyperlipidemia with target LDL  less than 70 11/24/2011   Hypo-osmolality and hyponatremia 03/11/2019   Iron deficiency anemia    Lipoprotein deficiency disorder    Long term (current) use of insulin (HCC) 07/15/2019   Obesity (BMI 30.0-34.9)    OSA (obstructive sleep apnea), uses oxygen at home did not tolerate cpap 11/24/2011   Pain due to onychomycosis of toenails of both feet 08/23/2022   Paresthesia of skin 02/17/2015   Peripheral vascular disease (HCC)    Portal hypertension (HCC)    Right hip pain 06/13/2021   S/P CABG (coronary artery bypass graft)    Shoulder joint pain 02/17/2015   Spinal stenosis    Splenomegaly    Thrombocytopenia (HCC)    Vitamin D deficiency    Past Surgical History:  Procedure Laterality Date   CERVICAL SPINE SURGERY  2012   CORONARY ANGIOPLASTY WITH STENT PLACEMENT  11/23/2011   "1; first one"   CORONARY ARTERY BYPASS GRAFT N/A 01/25/2021   Procedure: CORONARY ARTERY BYPASS GRAFTING (CABG) X2, USING LEFT INTERNAL MAMMARY ARTERY AND LEFT LEG GREATER SAPHENOUS VEIN HARVESTED ENDOSCOPICALLY;  Surgeon: Corliss Skains, MD;  Location: [facility] OR;  Service: Open Heart Surgery;  Laterality: N/A;   ENDOVEIN HARVEST OF GREATER SAPHENOUS VEIN Bilateral 01/25/2021   Procedure: ENDOVEIN HARVEST OF GREATER SAPHENOUS VEIN;  Surgeon: Corliss Skains, MD;  Location: [facility] OR;  Service: Open Heart Surgery;  Laterality: Bilateral;   LEFT HEART CATH AND CORONARY ANGIOGRAPHY N/A 12/28/2020   Procedure: LEFT HEART CATH AND CORONARY ANGIOGRAPHY;  Surgeon: Marykay Lex, MD;  Location: [facility] INVASIVE CV LAB;  Service: Cardiovascular;  Laterality: N/A;   LEFT HEART CATHETERIZATION WITH CORONARY ANGIOGRAM N/A 11/23/2011   Procedure: LEFT HEART CATHETERIZATION WITH CORONARY ANGIOGRAM;  Surgeon: Marykay Lex, MD;  Location: [facility] CATH LAB;  Service: Cardiovascular;  Laterality: N/A;   TEE WITHOUT CARDIOVERSION N/A 01/25/2021   Procedure: TRANSESOPHAGEAL ECHOCARDIOGRAM (TEE);  Surgeon: Corliss Skains, MD;   Location: [facility] OR;  Service: Open Heart Surgery;  Laterality: N/A;    Allergies  Allergies  Allergen Reactions   Morphine And Codeine Itching    Possible itching due to morphine 01/26/21   Cortisone    Other Other (See Comments)    Cats- eyes burn     EKGs/Labs/Other Studies Reviewed:   The following studies were reviewed today:   LHC 12/28/20   Prox LAD to Mid LAD lesion is 65% stenosed with 30% stenosed side branch in 1st Diag. (RFR 0.90-0.91)   1st Diag lesion is 100% stenosed. (In-stent Re-stenosis/occlusion)   Mid LAD lesion is 40% stenosed.   Mid LAD to Dist LAD lesion is 75% stenosed. (RFR 0.74)   Ost Cx to Mid Cx lesion is 55% stenosed with 20% stenosed side branch in 1st Mrg.   Mid Cx lesion is 90% stenosed.   Prox RCA lesion is 40% stenosed.   -------------------------------------------  The left ventricular systolic function is normal.  The left ventricular ejection fraction is 50-55% by visual estimate.   LV end diastolic pressure is normal.   There is no aortic valve stenosis.   SUMMARY Severe 3 Vessel CAD:  100% ISR of Major D1 stent,  focal 65% prox LAD (@ D1) lesion - FFR 0.91 followed by 40% LAD @ SP1 then long 70% mid LAD (FFR 0.74 - SIGNIFICANT).  prox-mid LCx diffuse 55% with focal 90% just after AVG branch in LCx-Lateral OM2. Moderate Prox-mid RCA 35-40% @ bend  Poor LV Gram image, but appears to have preserved LVEF with normal LVEDP (2 D Echo ordered).      RECOMMENDATIONS With extensive LAD and LCx disease and occluded diagonal, best treatment option would likely be CVTS.  He has been on Plavix since his PCI. CVTS consult placed, 2D echo ordered, and Plavix discontinued. Anticipate outpatient CVTS clinic visit with staged CABG after Plavix washout -> otherwise would need to consider extensive PCI of the LAD with focal PCI of LCx and attempted recanalization of occluded D1 stent Will need to continue aggressive risk factor modification/Guideline  Directed Medical Management   Echo 12/28/20 1. Left ventricular ejection fraction, by estimation, is 60 to 65%. The  left ventricle has normal function. The left ventricle has no regional  wall motion abnormalities. Left ventricular diastolic parameters are  consistent with Grade I diastolic  dysfunction (impaired relaxation).   2. Right ventricular systolic function is normal. The right ventricular  size is normal.   3. The mitral valve is normal in structure. No evidence of mitral valve  regurgitation. No evidence of mitral stenosis.   4. The aortic valve is normal in structure. Aortic valve regurgitation is  not visualized. No aortic stenosis is present.   5. The inferior vena cava is normal in size with greater than 50%  respiratory variability, suggesting right atrial pressure of 3 mmHg.   VAS Pre CABG Carotid and Extremities 01/21/21 Right Carotid: Velocities in the right ICA are consistent with a 1-39%  stenosis.   Left Carotid: Velocities in the left ICA are consistent with a 1-39%  stenosis.  Vertebrals: Bilateral vertebral arteries demonstrate antegrade flow.   Right ABI: Resting right ankle-brachial index is within normal range. No  evidence of significant right lower extremity arterial disease.  Left ABI: Resting left ankle-brachial index is within normal range. No  evidence of significant left lower extremity arterial disease.  Right Upper Extremity: Doppler waveforms decrease >50% with right radial  compression. Doppler waveforms remain within normal limits with right  ulnar compression.  Left Upper Extremity: Doppler waveform obliterate with left radial  compression. Doppler waveforms remain within normal limits with left ulnar  compression.   Echo 05/04/22: IMPRESSIONS     1. Left ventricular ejection fraction, by estimation, is 65 to 70%. The  left ventricle has normal function. The left ventricle has no regional  wall motion abnormalities. Left ventricular  diastolic parameters are  consistent with Grade I diastolic  dysfunction (impaired relaxation).   2. Right ventricular systolic function is normal. The right ventricular  size is normal. Tricuspid regurgitation signal is inadequate for assessing  PA pressure.   3. The mitral valve is abnormal. Trivial mitral valve regurgitation.   4. The aortic valve was not well visualized. Aortic valve regurgitation  is not visualized. Aortic valve sclerosis/calcification is present,  without any evidence of aortic stenosis.   Comparison(s): Changes from prior study are noted. 12/28/2020: LVEF 60-65%.  Myoview 05/12/22: Study Highlights      The study is normal. The study is low risk.   No ST deviation was noted.   Left ventricular function is normal. End diastolic cavity size is normal.   Prior study available for comparison from 12/04/2013.  EKG:  EKG is  not ordered today.    Recent Labs: 05/30/2022: TSH 1.570 09/06/2022: ALT 37; BUN 14; Creatinine 1.02; Hemoglobin 14.0; Platelet Count 134; Potassium 4.6; Sodium 139  Recent Lipid Panel    Component Value Date/Time   CHOL 143 05/30/2022 1106   TRIG 176 (H) 05/30/2022 1106   HDL 27 (L) 05/30/2022 1106   CHOLHDL 5.3 (H) 05/30/2022 1106   CHOLHDL 6.6 11/23/2011 0846   VLDL 29 11/23/2011 0846   LDLCALC 85 05/30/2022 1106    Home Medications   No outpatient medications have been marked as taking for the 11/09/22 encounter (Appointment) with [place],  M, MD.   Current Facility-Administered Medications for the 11/09/22 encounter (Appointment) with [place],  M, MD  Medication   cyanocobalamin (VITAMIN B12) injection 1,000 mcg     Review of Systems      All other systems reviewed and are otherwise negative except as noted above.  Physical Exam    VS:  There were no vitals taken for this visit. , BMI There is no height or weight on file to calculate BMI.  Wt Readings from Last 3 Encounters:  09/08/22 190 lb (86.2 kg)  09/06/22 187  lb 14.4 oz (85.2 kg)  08/17/22 188 lb 3.2 oz (85.4 kg)     GEN: Well nourished, well developed, in no acute distress. HEENT: normal. Neck: Supple, no JVD, carotid bruits, or masses. Cardiac: RRR, no murmurs, rubs, or gallops. No clubbing, cyanosis, edema.  Radials/PT 2+ and equal bilaterally.  Respiratory:  Respirations regular and unlabored, clear to auscultation bilaterally. GI: Soft, nontender, nondistended. MS: No deformity or atrophy. Skin: Warm and dry, no rash. Neuro:  Strength and sensation are intact. Psych: Normal affect.  Assessment & Plan    CAD status post PCI and ultimately CABG 12/2020 -no chest pain or SOB. Complains of a lot of fatigue.  -reviewed echo and recent lexiscan myoview- no ischemia and normal cardiac function -continue current medication regimen  Hyperlipidemia -LDL 85.  -statin had been erroneously discontinued.  - will resume Crestor 20 mg daily.  - repeat lab in 3 months. Goal LDL < 55.   Essential hypertension -well controlled  today -Continue Lasix 20 mg daily, Lopressor 25 mg twice daily  Bilateral carotid artery stenosis -minor disease on dopplers in Feb  Fatigue - unclear etiology.  DM Type 2 -on insulin and metformin -symptoms of neuropathy - per PCP    Disposition: Follow up 6 months.   Signed,  [place], MD 11/07/2022, 4:47 PM  [facility]

## 2022-11-07 NOTE — Telephone Encounter (Signed)
   Name: Paul [name]  DOB: Oct 24, 1950  MRN: [id number]  Primary Cardiologist: Peter [place], MD  Chart reviewed as part of pre-operative protocol coverage. The patient has an upcoming visit scheduled with Dr. [place] on 7/25/2024at which time clearance can be addressed in case there are any issues that would impact surgical recommendations.  ARTHRODESIS METATARSALPHALANGEAL JOINT (MTPJ) is not scheduled until TBD (surgery was scheduled for 11/06/2022 has been delayed). I added preop FYI to appointment note so that provider is aware to address at time of outpatient visit.  Per office protocol the cardiology provider should forward their finalized clearance decision and recommendations regarding antiplatelet therapy to the requesting party below.    I will route this message as FYI to requesting party and remove this message from the preop box as separate preop APP input not needed at this time.   Please call with any questions.  Joylene Grapes, NP  11/07/2022, 11:24 AM

## 2022-11-07 NOTE — Telephone Encounter (Signed)
-----   Message from Verlee Monte sent at 10/28/2022  3:31 PM EDT ----- Regarding: Request for pre-operative cardiac clearance Request for pre-operative cardiac clearance:  1. What type of surgery is being performed?  ARTHRODESIS METATARSALPHALANGEAL JOINT (MTPJ)  2. When is this surgery scheduled?  11/06/2023  3. Type of clearance being requested (medical, pharmacy, both)? MEDICAL   4. Are there any medications that need to be held prior to surgery? ASA  5. Practice name and name of physician performing surgery?  Performing surgeon: Dr. Nicholes Rough, DPM Requesting clearance: Quentin Mulling, FNP-C    6. Anesthesia type (none, local, MAC, general)? MAC + REGIONAL BLOCK  7. What is the office phone and fax number?   Fax: [phone]  ATTENTION: Unable to create telephone message as per your standard workflow. Directed by [facility] providers to send requests for cardiac clearance to this pool for appropriate distribution to provider covering pre-operative clearances.   Quentin Mulling, MSN, APRN, FNP-C, CEN [facility]  Peri-operative Services Nurse Practitioner Phone: [phone] 10/28/22 3:31 PM

## 2022-11-09 ENCOUNTER — Ambulatory Visit: Payer: Medicare Other | Attending: Cardiology | Admitting: Cardiology

## 2022-11-10 ENCOUNTER — Ambulatory Visit (INDEPENDENT_AMBULATORY_CARE_PROVIDER_SITE_OTHER): Payer: Medicare Other | Admitting: Nurse Practitioner

## 2022-11-10 ENCOUNTER — Encounter: Payer: Self-pay | Admitting: Nurse Practitioner

## 2022-11-10 ENCOUNTER — Other Ambulatory Visit (HOSPITAL_COMMUNITY): Payer: Self-pay

## 2022-11-10 ENCOUNTER — Ambulatory Visit
Admission: RE | Admit: 2022-11-10 | Discharge: 2022-11-10 | Disposition: A | Discharge status: Home / Self Care | Payer: Medicare Other | Source: Ambulatory Visit | Attending: Nurse Practitioner | Admitting: Nurse Practitioner

## 2022-11-10 VITALS — BP 126/82 | HR 67 | Wt 187.8 lb

## 2022-11-10 DIAGNOSIS — M5030 Other cervical disc degeneration, unspecified cervical region: Secondary | ICD-10-CM

## 2022-11-10 DIAGNOSIS — Q762 Congenital spondylolisthesis: Secondary | ICD-10-CM

## 2022-11-10 DIAGNOSIS — M47816 Spondylosis without myelopathy or radiculopathy, lumbar region: Secondary | ICD-10-CM | POA: Diagnosis not present

## 2022-11-10 DIAGNOSIS — F331 Major depressive disorder, recurrent, moderate: Secondary | ICD-10-CM

## 2022-11-10 DIAGNOSIS — M4316 Spondylolisthesis, lumbar region: Secondary | ICD-10-CM | POA: Diagnosis not present

## 2022-11-10 MED ORDER — CELECOXIB 100 MG PO CAPS
100.0000 mg | ORAL_CAPSULE | Freq: Two times a day (BID) | ORAL | 1 refills | Status: DC
Start: 2022-11-10 — End: 2022-11-29
  Filled 2022-11-10: qty 60, 30d supply, fill #0

## 2022-11-10 MED ORDER — DULOXETINE HCL 20 MG PO CPEP
20.0000 mg | ORAL_CAPSULE | Freq: Every day | ORAL | 3 refills | Status: DC
Start: 2022-11-10 — End: 2023-01-11
  Filled 2022-11-10: qty 30, 30d supply, fill #0

## 2022-11-10 NOTE — Progress Notes (Unsigned)
Tollie Eth, DNP, AGNP-c [facility] [address] [place], [place] [address] [phone]   ACUTE VISIT- ESTABLISHED PATIENT  Blood pressure 126/82, pulse 67, weight 187 lb 12.8 oz (85.2 kg), SpO2 94%.  Subjective:  HPI Paul [name] is a 72 y.o. male presents to day for evaluation of: Back pain Paul [name] presents with bilateral hand pain and tingling extending down to the hands. He reports that the pain comes and goes and describes it as a sudden jolt, similar to a lightning strike. He reports this has been ongoing for some time and nothing seems to help. He does experience shoulder and neck pain, as well. He reports the pain in his neck and upper back has worsened with time and he feels that this contributes to the pain in his shoulders. At this time the left side is more painful than the right.   He is currently managed with pain management for his pain, but this is not providing relief of his symptoms. He tells me he would like to know if additional interventions may be available aside from opiate treatment to help relieve his pain.   Additionally, he experiences pain in his lower back and into his legs. Paul [name] expresses difficulty sleeping due to discomfort and struggles with mobility, often needing to use the wall for support. He also reports a lack of energy and difficulty completing daily tasks, which has led to feelings of sadness and frustration. Paul [name] has a history of diabetes, which causes frequent urination, but no other bowel or urinary symptoms were reported.  Paul [name] has a strained relationship with his children, which has caused emotional distress and contributed to his low mood. He is open to trying medication to help improve his mood and potentially alleviate some of the pain.  PMH, Medications, and Allergies reviewed and updated in chart as appropriate.   ROS negative except for what is listed in HPI. Objective:  Physical Exam Vitals and nursing note  reviewed.  Constitutional:      General: He is not in acute distress. Eyes:     Conjunctiva/sclera: Conjunctivae normal.  Cardiovascular:     Rate and Rhythm: Normal rate and regular rhythm.     Pulses: Normal pulses.     Heart sounds: Normal heart sounds.  Pulmonary:     Effort: Pulmonary effort is normal.     Breath sounds: Normal breath sounds.  Musculoskeletal:     Cervical back: Tenderness present.     Right lower leg: No edema.     Left lower leg: No edema.  Skin:    General: Skin is warm and dry.     Capillary Refill: Capillary refill takes less than 2 seconds.  Neurological:     Mental Status: He is alert and oriented to person, place, and time.     Sensory: No sensory deficit.     Motor: Weakness present.     Gait: Gait abnormal.  Psychiatric:        Behavior: Behavior normal.         Assessment & Plan:   Problem List Items Addressed This Visit     Spondylolisthesis, congenital - Primary   Relevant Medications   celecoxib (CELEBREX) 100 MG capsule   Other Relevant Orders   DG Lumbar Spine 2-3 Views   Ambulatory referral to Spine Surgery   Major depressive disorder   Relevant Medications   DULoxetine (CYMBALTA) 20 MG capsule   DDD (degenerative disc disease), cervical   Relevant Medications   celecoxib (  CELEBREX) 100 MG capsule   Other Relevant Orders   Ambulatory referral to Spine Surgery   Bilateral Hand and Foot Pain, Tingling, and Numbness Suspected nerve pain possibly due to spinal issues vs shoulder impingement. There is radiation of symptoms into the bilateral upper extremities with numbness, tingling and pain appreciated. His strength is equal bilaterally, but weak. The pain is more severe on the left side.  Plan: - Refer to spinal specialist for further evaluation and possible treatment options, such as injections or stimulator placement - We will hold off on shoulder and c-spine imaging at this time until he can be seen by the spinal specialist,  as they may have suggestions for other options.  Chronic Back Pain   Worsening low back pain, possibly contributing to lower extremity symptoms of weakness and paresthesia's. The back pain has been gradually worsening over time. At this time there are no saddle symptoms, which is reassuring. He has not had significant management of his pain with opiate therapy. Today we discussed alternative options may be available and more helpful for management of his pain through a spinal specialist. He is in agreement for a referral for evaluation if he is a candidate for alternative treatments to help with his pain and mobility. I do feel that physical therapy for strengthening, and balance would be beneficial. We do not have updated imaging of the lower back, therefore, I will order this today to ensure that there is not any obvious cause of his pain.  Plan:   - Order lumbar spine x-ray - Refer to spinal specialist for evaluation and management - Prescribe Celebrex for inflammation and pain control, to be taken in conjunction with current pain medication  Low Energy and Mood Mood and energy concerns related to chronic pain and limited mobility. His chronic pain has led to inability to perform activities he would like and he expresses frustration over inability to function independently without risk of falls. He also mentions strain between himself and his children and the impact this has had on his mood. We discussed options of speaking with his children and reconciling, but he does not feel this would be possible at this time. He declines counseling but is open to medication management.    Plan: - Prescribe a daily antidepressant to help improve mood and energy levels - Reassess mood and energy after a trial period on the medication  Urinary Frequency Likely related to diabetes. No alarm symptoms are present.  Plan:  - Continue monitoring blood sugar levels and managing diabetes as previously  directed   Time: 37 minutes, >50% spent counseling, care coordination, chart review, and documentation.    Tollie Eth, DNP, AGNP-c   History, Medications, Surgery, SDOH, and Family History reviewed and updated as appropriate.

## 2022-11-10 NOTE — Patient Instructions (Signed)
I put a referral for the back specialist to give you a call to see if there is anything that can be done aside from pain medication to help with your pain.   I have sent in a medication called Celebrex that you can take twice a day to see if this helps with some of the pain. This is ok to take with your pain medication and will not interfere.   I have also sent in an order for the x-ray.  You can walk in to have the x-ray done at [facility]. We will make sure that there isn't anything significant changing in the back.   I think when we get you feeling like you can move a little better your mood and energy will improve.

## 2022-11-14 ENCOUNTER — Ambulatory Visit: Payer: Medicare Other | Admitting: Nurse Practitioner

## 2022-11-15 ENCOUNTER — Encounter: Payer: Medicare Other | Admitting: Podiatry

## 2022-11-15 ENCOUNTER — Telehealth: Payer: Self-pay

## 2022-11-15 ENCOUNTER — Other Ambulatory Visit (HOSPITAL_COMMUNITY): Payer: Self-pay

## 2022-11-15 NOTE — Telephone Encounter (Signed)
Pt. Called stating that the medicine that was called in for him last appt. Has been making him sick so he stopped taking both of them because he wasn't sure which one was making him feel so bad. He stopped Celebrex and Duloxetine. It made him feel very sick to his stomach and fatigued and have no energy.

## 2022-11-16 ENCOUNTER — Other Ambulatory Visit: Payer: Self-pay

## 2022-11-20 ENCOUNTER — Ambulatory Visit: Payer: Medicare Other | Admitting: Nurse Practitioner

## 2022-11-21 ENCOUNTER — Ambulatory Visit (INDEPENDENT_AMBULATORY_CARE_PROVIDER_SITE_OTHER): Payer: Medicare Other

## 2022-11-21 DIAGNOSIS — Z0000 Encounter for general adult medical examination without abnormal findings: Secondary | ICD-10-CM

## 2022-11-21 NOTE — Patient Instructions (Signed)
Mr. Westenhaver , Thank you for taking time to come for your Medicare Wellness Visit. I appreciate your ongoing commitment to your health goals. Please review the following plan we discussed and let me know if I can assist you in the future.   Referrals/Orders/Follow-Ups/Clinician Recommendations: none  This is a list of the screening recommended for you and due dates:  Health Maintenance  Topic Date Due   Yearly kidney health urinalysis for diabetes  Never done   DTaP/Tdap/Td vaccine (1 - Tdap) Never done   Colon Cancer Screening  Never done   Screening for Lung Cancer  01/09/2022   Eye exam for diabetics  09/01/2022   Flu Shot  11/16/2022   Complete foot exam   02/01/2023   Hemoglobin A1C  02/09/2023   Yearly kidney function blood test for diabetes  09/06/2023   Medicare Annual Wellness Visit  11/21/2023   Pneumonia Vaccine  Completed   HPV Vaccine  Aged Out   COVID-19 Vaccine  Discontinued   Hepatitis C Screening  Discontinued   Zoster (Shingles) Vaccine  Discontinued    Advanced directives: (Copy Requested) Please bring a copy of your health care power of attorney and living will to the office to be added to your chart at your convenience.  Next Medicare Annual Wellness Visit scheduled for next year: Yes  Preventive Care 21 Years and Older, Male  Preventive care refers to lifestyle choices and visits with your health care provider that can promote health and wellness. What does preventive care include? A yearly physical exam. This is also called an annual well check. Dental exams once or twice a year. Routine eye exams. Ask your health care provider how often you should have your eyes checked. Personal lifestyle choices, including: Daily care of your teeth and gums. Regular physical activity. Eating a healthy diet. Avoiding tobacco and drug use. Limiting alcohol use. Practicing safe sex. Taking low doses of aspirin every day. Taking vitamin and mineral supplements as  recommended by your health care provider. What happens during an annual well check? The services and screenings done by your health care provider during your annual well check will depend on your age, overall health, lifestyle risk factors, and family history of disease. Counseling  Your health care provider may ask you questions about your: Alcohol use. Tobacco use. Drug use. Emotional well-being. Home and relationship well-being. Sexual activity. Eating habits. History of falls. Memory and ability to understand (cognition). Work and work Astronomer. Screening  You may have the following tests or measurements: Height, weight, and BMI. Blood pressure. Lipid and cholesterol levels. These may be checked every 5 years, or more frequently if you are over 45 years old. Skin check. Lung cancer screening. You may have this screening every year starting at age 59 if you have a 30-pack-year history of smoking and currently smoke or have quit within the past 15 years. Fecal occult blood test (FOBT) of the stool. You may have this test every year starting at age 50. Flexible sigmoidoscopy or colonoscopy. You may have a sigmoidoscopy every 5 years or a colonoscopy every 10 years starting at age 27. Prostate cancer screening. Recommendations will vary depending on your family history and other risks. Hepatitis C blood test. Hepatitis B blood test. Sexually transmitted disease (STD) testing. Diabetes screening. This is done by checking your blood sugar (glucose) after you have not eaten for a while (fasting). You may have this done every 1-3 years. Abdominal aortic aneurysm (AAA) screening. You may need  this if you are a current or former smoker. Osteoporosis. You may be screened starting at age 70 if you are at high risk. Talk with your health care provider about your test results, treatment options, and if necessary, the need for more tests. Vaccines  Your health care provider may recommend  certain vaccines, such as: Influenza vaccine. This is recommended every year. Tetanus, diphtheria, and acellular pertussis (Tdap, Td) vaccine. You may need a Td booster every 10 years. Zoster vaccine. You may need this after age 16. Pneumococcal 13-valent conjugate (PCV13) vaccine. One dose is recommended after age 14. Pneumococcal polysaccharide (PPSV23) vaccine. One dose is recommended after age 59. Talk to your health care provider about which screenings and vaccines you need and how often you need them. This information is not intended to replace advice given to you by your health care provider. Make sure you discuss any questions you have with your health care provider. Document Released: 04/30/2015 Document Revised: 12/22/2015 Document Reviewed: 02/02/2015 Elsevier Interactive Patient Education  2017 [employer].  Fall Prevention in the Home Falls can cause injuries. They can happen to people of all ages. There are many things you can do to make your home safe and to help prevent falls. What can I do on the outside of my home? Regularly fix the edges of walkways and driveways and fix any cracks. Remove anything that might make you trip as you walk through a door, such as a raised step or threshold. Trim any bushes or trees on the path to your home. Use bright outdoor lighting. Clear any walking paths of anything that might make someone trip, such as rocks or tools. Regularly check to see if handrails are loose or broken. Make sure that both sides of any steps have handrails. Any raised decks and porches should have guardrails on the edges. Have any leaves, snow, or ice cleared regularly. Use sand or salt on walking paths during winter. Clean up any spills in your garage right away. This includes oil or grease spills. What can I do in the bathroom? Use night lights. Install grab bars by the toilet and in the tub and shower. Do not use towel bars as grab bars. Use non-skid mats or  decals in the tub or shower. If you need to sit down in the shower, use a plastic, non-slip stool. Keep the floor dry. Clean up any water that spills on the floor as soon as it happens. Remove soap buildup in the tub or shower regularly. Attach bath mats securely with double-sided non-slip rug tape. Do not have throw rugs and other things on the floor that can make you trip. What can I do in the bedroom? Use night lights. Make sure that you have a light by your bed that is easy to reach. Do not use any sheets or blankets that are too big for your bed. They should not hang down onto the floor. Have a firm chair that has side arms. You can use this for support while you get dressed. Do not have throw rugs and other things on the floor that can make you trip. What can I do in the kitchen? Clean up any spills right away. Avoid walking on wet floors. Keep items that you use a lot in easy-to-reach places. If you need to reach something above you, use a strong step stool that has a grab bar. Keep electrical cords out of the way. Do not use floor polish or wax that makes floors  slippery. If you must use wax, use non-skid floor wax. Do not have throw rugs and other things on the floor that can make you trip. What can I do with my stairs? Do not leave any items on the stairs. Make sure that there are handrails on both sides of the stairs and use them. Fix handrails that are broken or loose. Make sure that handrails are as long as the stairways. Check any carpeting to make sure that it is firmly attached to the stairs. Fix any carpet that is loose or worn. Avoid having throw rugs at the top or bottom of the stairs. If you do have throw rugs, attach them to the floor with carpet tape. Make sure that you have a light switch at the top of the stairs and the bottom of the stairs. If you do not have them, ask someone to add them for you. What else can I do to help prevent falls? Wear shoes that: Do not  have high heels. Have rubber bottoms. Are comfortable and fit you well. Are closed at the toe. Do not wear sandals. If you use a stepladder: Make sure that it is fully opened. Do not climb a closed stepladder. Make sure that both sides of the stepladder are locked into place. Ask someone to hold it for you, if possible. Clearly mark and make sure that you can see: Any grab bars or handrails. First and last steps. Where the edge of each step is. Use tools that help you move around (mobility aids) if they are needed. These include: Canes. Walkers. Scooters. Crutches. Turn on the lights when you go into a dark area. Replace any light bulbs as soon as they burn out. Set up your furniture so you have a clear path. Avoid moving your furniture around. If any of your floors are uneven, fix them. If there are any pets around you, be aware of where they are. Review your medicines with your doctor. Some medicines can make you feel dizzy. This can increase your chance of falling. Ask your doctor what other things that you can do to help prevent falls. This information is not intended to replace advice given to you by your health care provider. Make sure you discuss any questions you have with your health care provider. Document Released: 01/28/2009 Document Revised: 09/09/2015 Document Reviewed: 05/08/2014 Elsevier Interactive Patient Education  2017 [employer].

## 2022-11-21 NOTE — Progress Notes (Signed)
Subjective:   Paul [name] is a 72 y.o. male who presents for Medicare Annual/Subsequent preventive examination.  Visit Complete: Virtual  I connected with  [name] on 11/21/22 by a audio enabled telemedicine application and verified that I am speaking with the correct person using two identifiers.  Patient Location: Home  Provider Location: Office/Clinic  I discussed the limitations of evaluation and management by telemedicine. The patient expressed understanding and agreed to proceed.  Vital Signs: Patient was unable to self-report vital signs via telehealth due to a lack of equipment at home.  Review of Systems     Cardiac Risk Factors include: advanced age (>28men, >13 women);diabetes mellitus;dyslipidemia;hypertension;male gender     Objective:    Today's Vitals   11/21/22 1151  PainSc: 4    There is no height or weight on file to calculate BMI.     11/21/2022   12:04 PM 06/08/2022    3:33 PM 05/18/2022   12:47 PM 11/07/2021    8:31 PM 10/26/2021   11:09 AM 03/07/2021   11:08 AM 01/25/2021    6:11 AM  Advanced Directives  Does Patient Have a Medical Advance Directive? Yes No Yes No No Yes No  Type of Estate agent of [place];Living will  Healthcare Power of [place];Living will   Living will;Healthcare Power of Attorney   Does patient want to make changes to medical advance directive?  No - Patient declined No - Patient declined      Copy of Healthcare Power of Attorney in Chart? No - copy requested  No - copy requested      Would patient like information on creating a medical advance directive?  No - Patient declined  No - Patient declined No - Patient declined No - Patient declined No - Patient declined    Current Medications (verified) Outpatient Encounter Medications as of 11/21/2022  Medication Sig   acetaminophen (TYLENOL) 500 MG tablet Take 1,500-2,000 mg by mouth daily.   albuterol (VENTOLIN HFA) 108 (90 Base) MCG/ACT inhaler Inhale 2  puffs into the lungs every 4 (four) hours as needed. For breathing.   allopurinol (ZYLOPRIM) 100 MG tablet Take 1 tablet (100 mg total) by mouth daily.   amLODipine (NORVASC) 5 MG tablet Take 1 tablet (5 mg total) by mouth daily.   aspirin 81 MG EC tablet Take 1 tablet (81 mg total) by mouth daily.   celecoxib (CELEBREX) 100 MG capsule Take 1 capsule (100 mg total) by mouth 2 (two) times daily.   Continuous Glucose Sensor (FREESTYLE LIBRE 3 SENSOR) MISC    DULoxetine (CYMBALTA) 20 MG capsule Take 1 capsule (20 mg total) by mouth daily.   gabapentin (NEURONTIN) 300 MG capsule Take 2 capsules (600 mg total) by mouth 3 (three) times daily. For back and foot pain.   glucose blood (ONETOUCH VERIO) test strip Use to check blood glucose 3 times daily   HYDROcodone-acetaminophen (NORCO/VICODIN) 5-325 MG tablet Take 1-2 tablets by mouth in the morning AND 1 tablet daily at 12 noon AND 1 tablet at bedtime.   insulin glargine, 1 Unit Dial, (TOUJEO SOLOSTAR) 300 UNIT/ML Solostar Pen Inject 40 Units into the skin daily.   insulin lispro (HUMALOG KWIKPEN) 200 UNIT/ML KwikPen INJECT 14 UNITS UNDER THE SKIN EVERY MORNING, 14 UNITS WITH LUNCH AND 14 UNITS WITH SUPPER, DAILY   Insulin Pen Needle 32G X 4 MM MISC Use daily with insulin as directed   Iron, Ferrous Sulfate, 325 (65 Fe) MG TABS Take 1 tablet (  325 mg) by mouth daily.   Lidocaine (ZTLIDO) 1.8 % PTCH Apply 1 patch topically daily. (May wear up to 12hours.)   metFORMIN (GLUCOPHAGE-XR) 500 MG 24 hr tablet Take 2 tablets (1,000 mg total) by mouth 2 (two) times daily.   metoprolol tartrate (LOPRESSOR) 25 MG tablet Take 1 tablet (25 mg total) by mouth 2 (two) times daily.   pantoprazole (PROTONIX) 40 MG tablet Take 1 tablet (40 mg total) by mouth daily.   potassium chloride (KLOR-CON M) 10 MEQ tablet Take 1 tablet (10 mEq total) by mouth daily.   rosuvastatin (CRESTOR) 20 MG tablet Take 1 tablet (20 mg total) by mouth daily.   Tetrahydrozoline HCl (VISINE  EXTRA OP) Place 1 drop into both eyes as needed (itching).   umeclidinium-vilanterol (ANORO ELLIPTA) 62.5-25 MCG/ACT AEPB Inhale 1 puff into the lungs daily.   zolpidem (AMBIEN) 10 MG tablet Take 1 tablet (10 mg total) by mouth at bedtime as needed for sleep   AMBULATORY NON FORMULARY MEDICATION Medical alert device as covered by insurance for frequent falls and weakness. (Patient not taking: Reported on 11/21/2022)   [START ON 11/30/2022] HYDROcodone-acetaminophen (NORCO/VICODIN) 5-325 MG tablet Take 1-2 tablets by mouth in the morning AND 1 tablet daily at 12 noon AND 1 tablet at bedtime. (11/30/22)   HYDROcodone-acetaminophen (NORCO/VICODIN) 5-325 MG tablet Take 1-2 tablets by mouth in the morning AND 1 tablet daily at 12 noon AND 1 tablet at bedtime. (10/31/22)   Facility-Administered Encounter Medications as of 11/21/2022  Medication   cyanocobalamin (VITAMIN B12) injection 1,000 mcg    Allergies (verified) Morphine and codeine, Cortisone, and Other   History: Past Medical History:  Diagnosis Date   Angina, class III (HCC)    Anxiety    Arthritis    Bilateral carotid artery stenosis    BPH without obstruction/lower urinary tract symptoms    CAD 11/24/2011   a) Mild-to-moderate 30-40% lesions in the RCA, LAD and Circumflex. b) CULPRIT LESION: long tubular 70-80% lesion in D1 with FFR of 0.7 --> PCI w/ Xience Xpedition DES 2.5 mm x 30 mm (2.65 MM); c) Lexiscan Myoview 11/2013: No Ischemia or Infarct (Inferior Gut Attenuation) EF 63%.   Chronic fatigue syndrome 06/13/2021   Chronic heart failure (HCC)    Chronic kidney disease (CKD), stage III (moderate) (HCC)    Chronic pain syndrome    Cigarette smoker 02/22/2014   Cirrhosis (HCC)    Constipation by delayed colonic transit 06/13/2021   COPD (chronic obstructive pulmonary disease) (HCC)    "don't have full case of it; I'm right there at it"   Depression    Diabetes mellitus without complication (HCC)    Diverticulosis    Dysphagia     Elevated alkaline phosphatase level 08/17/2022   Elevated levels of transaminase & lactic acid dehydrogenase 06/09/2016   Essential hypertension 11/24/2011   Exertional dyspnea, chronic    Former smoker    GERD (gastroesophageal reflux disease)    Gout    H/O: GI bleed    Hearing loss    Hyperglycemia 03/11/2019   Hyperlipidemia with target LDL less than 70 11/24/2011   Hypo-osmolality and hyponatremia 03/11/2019   Iron deficiency anemia    Lipoprotein deficiency disorder    Long term (current) use of insulin (HCC) 07/15/2019   Obesity (BMI 30.0-34.9)    OSA (obstructive sleep apnea), uses oxygen at home did not tolerate cpap 11/24/2011   Pain due to onychomycosis of toenails of both feet 08/23/2022   Paresthesia of skin  02/17/2015   Peripheral vascular disease (HCC)    Portal hypertension (HCC)    Right hip pain 06/13/2021   S/P CABG (coronary artery bypass graft)    Shoulder joint pain 02/17/2015   Spinal stenosis    Splenomegaly    Thrombocytopenia (HCC)    Vitamin D deficiency    Past Surgical History:  Procedure Laterality Date   CERVICAL SPINE SURGERY  2012   CORONARY ANGIOPLASTY WITH STENT PLACEMENT  11/23/2011   "1; first one"   CORONARY ARTERY BYPASS GRAFT N/A 01/25/2021   Procedure: CORONARY ARTERY BYPASS GRAFTING (CABG) X2, USING LEFT INTERNAL MAMMARY ARTERY AND LEFT LEG GREATER SAPHENOUS VEIN HARVESTED ENDOSCOPICALLY;  Surgeon: Corliss Skains, MD;  Location: [facility] OR;  Service: Open Heart Surgery;  Laterality: N/A;   ENDOVEIN HARVEST OF GREATER SAPHENOUS VEIN Bilateral 01/25/2021   Procedure: ENDOVEIN HARVEST OF GREATER SAPHENOUS VEIN;  Surgeon: Corliss Skains, MD;  Location: [facility] OR;  Service: Open Heart Surgery;  Laterality: Bilateral;   LEFT HEART CATH AND CORONARY ANGIOGRAPHY N/A 12/28/2020   Procedure: LEFT HEART CATH AND CORONARY ANGIOGRAPHY;  Surgeon: Marykay Lex, MD;  Location: [facility] INVASIVE CV LAB;  Service: Cardiovascular;  Laterality: N/A;   LEFT  HEART CATHETERIZATION WITH CORONARY ANGIOGRAM N/A 11/23/2011   Procedure: LEFT HEART CATHETERIZATION WITH CORONARY ANGIOGRAM;  Surgeon: Marykay Lex, MD;  Location: [facility] CATH LAB;  Service: Cardiovascular;  Laterality: N/A;   TEE WITHOUT CARDIOVERSION N/A 01/25/2021   Procedure: TRANSESOPHAGEAL ECHOCARDIOGRAM (TEE);  Surgeon: Corliss Skains, MD;  Location: [facility] OR;  Service: Open Heart Surgery;  Laterality: N/A;   Family History  Problem Relation Age of Onset   Heart disease Sister    Heart attack Brother    Emphysema Father        smoked   Aneurysm Father    Social History   Socioeconomic History   Marital status: Divorced    Spouse name: Not on file   Number of children: 3   Years of education: 11th   Highest education level: 11th grade  Occupational History   Not on file  Tobacco Use   Smoking status: Former    Current packs/day: 0.00    Average packs/day: 1 pack/day for 40.0 years (40.0 ttl pk-yrs)    Types: Cigarettes    Start date: 06/10/1976    Quit date: 06/10/2016    Years since quitting: 6.4    Passive exposure: Past   Smokeless tobacco: Never   Tobacco comments:    02/18/14- smokes occ cig maybe 3 x per wk  Vaping Use   Vaping status: Never Used  Substance and Sexual Activity   Alcohol use: Yes    Alcohol/week: 0.0 standard drinks of alcohol    Comment: social 2-3 drink a week   Drug use: No   Sexual activity: Not Currently    Partners: Female  Other Topics Concern   Not on file  Social History Narrative   He is a 72 y.o. divorced father of 3, grandfather 1.   He is a retired Equities trader, former Nutritional therapist. 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   He quit smoking in August 2013, after smoking a pack a day for roughly 40 years.   He drinks socially 2-3 drinks a week only.   He does not get routine exercise, mostly due to 2 fatigue and dyspnea. Otherwise been  relatively sedentary.   Social Determinants  of Health   Financial Resource Strain: Low Risk  (11/21/2022)   Overall Financial Resource Strain (CARDIA)    Difficulty of Paying Living Expenses: Not hard at all  Food Insecurity: No Food Insecurity (11/21/2022)   Hunger Vital Sign    Worried About Running Out of Food in the Last Year: Never true    Ran Out of Food in the Last Year: Never true  Transportation Needs: No Transportation Needs (11/21/2022)   PRAPARE - Administrator, Civil Service (Medical): No    Lack of Transportation (Non-Medical): No  Physical Activity: Inactive (11/21/2022)   Exercise Vital Sign    Days of Exercise per Week: 0 days    Minutes of Exercise per Session: 0 min  Stress: No Stress Concern Present (11/21/2022)   [employer] of Occupational Health - Occupational Stress Questionnaire    Feeling of Stress : Not at all  Social Connections: Socially Isolated (11/21/2022)   Social Connection and Isolation Panel [NHANES]    Frequency of Communication with Friends and Family: More than three times a week    Frequency of Social Gatherings with Friends and Family: Three times a week    Attends Religious Services: Never    Active Member of Clubs or Organizations: No    Attends Banker Meetings: Never    Marital Status: Divorced    Tobacco Counseling Counseling given: Not Answered Tobacco comments: 02/18/14- smokes occ cig maybe 3 x per wk   Clinical Intake:  Pre-visit preparation completed: Yes  Pain : 0-10 Pain Score: 4  Pain Type: Chronic pain Pain Location: Back (both shoulders) Pain Orientation: Lower Pain Descriptors / Indicators: Aching Pain Onset: More than a month ago Pain Frequency: Constant     Nutritional Risks: None Diabetes: Yes CBG done?: No Did pt. bring in CBG monitor from home?: No  How often do you need to have someone help you when you read instructions, pamphlets, or other written materials from your doctor or  pharmacy?: 1 - Never  Interpreter Needed?: No  Information entered by :: NAllen LPN   Activities of Daily Living    11/21/2022   11:55 AM  In your present state of health, do you have any difficulty performing the following activities:  Hearing? 1  Comment says not paying attention  Vision? 0  Difficulty concentrating or making decisions? 1  Walking or climbing stairs? 1  Comment due to back and legs  Dressing or bathing? 0  Doing errands, shopping? 0  Preparing Food and eating ? N  Using the Toilet? N  In the past six months, have you accidently leaked urine? Y  Comment due to diabetes  Do you have problems with loss of bowel control? N  Managing your Medications? Y  Comment friend manages  Managing your Finances? N  Housekeeping or managing your Housekeeping? Y    Patient Care Team: Early, Sung Amabile, NP as PCP - General (Nurse Practitioner) [place], [name], MD as PCP - Cardiology (Cardiology) Audrie Gallus, RN as [facility] Care Management  Indicate any recent Medical Services you may have received from other than [facility] providers in the past year (date may be approximate).     Assessment:   This is a routine wellness examination for Jamer.  Hearing/Vision screen Hearing Screening - Comments:: Denies hearing issues Vision Screening - Comments:: regular eye exams,   Dietary issues and exercise activities discussed:     Goals Addressed  This Visit's Progress    Patient Stated       11/21/2022, wants to stay busy       Depression Screen    11/21/2022   12:07 PM 05/18/2022   12:32 PM 11/07/2021    8:22 PM 11/04/2021   11:10 AM 10/28/2021   10:40 AM 03/07/2021   10:37 AM 07/29/2018   10:12 AM  PHQ 2/9 Scores  PHQ - 2 Score 0 0 0 0 0 0 0  PHQ- 9 Score 0  0 0 0      Fall Risk    11/21/2022   12:05 PM 05/18/2022   12:31 PM 02/15/2022    9:55 AM 11/07/2021    8:16 PM 10/28/2021   10:40 AM  Fall Risk   Falls in the past year? 1 0 0 1 1   Comment legs give out, sugar gets high      Number falls in past yr: 1 1 0 0 0  Injury with Fall? 0 0 0 1 1  Risk for fall due to : Medication side effect;Impaired mobility;History of fall(s) History of fall(s) History of fall(s);Impaired balance/gait History of fall(s);Impaired balance/gait;Impaired mobility History of fall(s)  Follow up Falls prevention discussed;Falls evaluation completed Falls evaluation completed;Education provided;Falls prevention discussed Education provided;Falls prevention discussed Falls evaluation completed;Education provided;Falls prevention discussed Falls evaluation completed    MEDICARE RISK AT HOME:  Medicare Risk at Home - 11/21/22 1206     Any stairs in or around the home? Yes    If so, are there any without handrails? No    Home free of loose throw rugs in walkways, pet beds, electrical cords, etc? Yes    Adequate lighting in your home to reduce risk of falls? Yes    Life alert? No    Use of a cane, walker or w/c? Yes    Grab bars in the bathroom? No    Shower chair or bench in shower? Yes    Elevated toilet seat or a handicapped toilet? Yes             TIMED UP AND GO:  Was the test performed?  No    Cognitive Function:        11/21/2022   12:09 PM 11/04/2021   11:19 AM  6CIT Screen  What Year? 0 points 4 points  What month? 0 points 0 points  What time? 0 points 0 points  Count back from 20 0 points 0 points  Months in reverse 4 points 4 points  Repeat phrase 0 points 4 points  Total Score 4 points 12 points    Immunizations Immunization History  Administered Date(s) Administered   Fluad Quad(high Dose 65+) 02/23/2020   Influenza Split 01/05/2011, 01/08/2013, 01/15/2014, 02/09/2014   Influenza, Quadrivalent, Recombinant, Inj, Pf 01/31/2022   Influenza,inj,Quad PF,6+ Mos 02/17/2015   Influenza-Unspecified 02/08/2016, 01/15/2017, 03/10/2018   PFIZER(Purple Top)SARS-COV-2 Vaccination 06/19/2019, 07/16/2019   PNEUMOCOCCAL  CONJUGATE-20 01/31/2022   Pneumococcal Polysaccharide-23 05/13/2010    TDAP status: Due, Education has been provided regarding the importance of this vaccine. Advised may receive this vaccine at local pharmacy or Health Dept. Aware to provide a copy of the vaccination record if obtained from local pharmacy or Health Dept. Verbalized acceptance and understanding.  Flu Vaccine status: Due, Education has been provided regarding the importance of this vaccine. Advised may receive this vaccine at local pharmacy or Health Dept. Aware to provide a copy of the vaccination record if obtained from local pharmacy or Health  Dept. Verbalized acceptance and understanding.  Pneumococcal vaccine status: Up to date  Covid-19 vaccine status: Information provided on how to obtain vaccines.   Qualifies for Shingles Vaccine? Yes   Zostavax completed No   Shingrix Completed?: No.    Education has been provided regarding the importance of this vaccine. Patient has been advised to call insurance company to determine out of pocket expense if they have not yet received this vaccine. Advised may also receive vaccine at local pharmacy or Health Dept. Verbalized acceptance and understanding.  Screening Tests Health Maintenance  Topic Date Due   Diabetic kidney evaluation - Urine ACR  Never done   DTaP/Tdap/Td (1 - Tdap) Never done   Colonoscopy  Never done   Lung Cancer Screening  01/09/2022   OPHTHALMOLOGY EXAM  09/01/2022   INFLUENZA VACCINE  11/16/2022   FOOT EXAM  02/01/2023   HEMOGLOBIN A1C  02/09/2023   Diabetic kidney evaluation - eGFR measurement  09/06/2023   Medicare Annual Wellness (AWV)  11/21/2023   Pneumonia Vaccine 55+ Years old  Completed   HPV VACCINES  Aged Out   COVID-19 Vaccine  Discontinued   Hepatitis C Screening  Discontinued   Zoster Vaccines- Shingrix  Discontinued    Health Maintenance  Health Maintenance Due  Topic Date Due   Diabetic kidney evaluation - Urine ACR  Never done    DTaP/Tdap/Td (1 - Tdap) Never done   Colonoscopy  Never done   Lung Cancer Screening  01/09/2022   OPHTHALMOLOGY EXAM  09/01/2022   INFLUENZA VACCINE  11/16/2022    Colorectal cancer screening: declines at this time  Lung Cancer Screening: (Low Dose CT Chest recommended if Age 15-80 years, 20 pack-year currently smoking OR have quit w/in 15years.) does not qualify.   Lung Cancer Screening Referral: no  Additional Screening:  Hepatitis C Screening: does not qualify;   Vision Screening: Recommended annual ophthalmology exams for early detection of glaucoma and other disorders of the eye. Is the patient up to date with their annual eye exam?  No  Who is the provider or what is the name of the office in which the patient attends annual eye exams? Can't remember name If pt is not established with a provider, would they like to be referred to a provider to establish care? No .   Dental Screening: Recommended annual dental exams for proper oral hygiene  Diabetic Foot Exam: Diabetic Foot Exam: Overdue, Pt has been advised about the importance in completing this exam. Pt is scheduled for diabetic foot exam on next appointment.  Community Resource Referral / Chronic Care Management: CRR required this visit?  No   CCM required this visit?  No     Plan:     I have personally reviewed and noted the following in the patient's chart:   Medical and social history Use of alcohol, tobacco or illicit drugs  Current medications and supplements including opioid prescriptions. Patient is currently taking opioid prescriptions. Information provided to patient regarding non-opioid alternatives. Patient advised to discuss non-opioid treatment plan with their provider. Functional ability and status Nutritional status Physical activity Advanced directives List of other physicians Hospitalizations, surgeries, and ER visits in previous 12 months Vitals Screenings to include cognitive, depression,  and falls Referrals and appointments  In addition, I have reviewed and discussed with patient certain preventive protocols, quality metrics, and best practice recommendations. A written personalized care plan for preventive services as well as general preventive health recommendations were provided to patient.  Barb Merino, LPN   12/21/452   After Visit Summary: (Pick Up) Due to this being a telephonic visit, with patients personalized plan was offered to patient and patient has requested to Pick up at office.  Nurse Notes: none

## 2022-11-24 ENCOUNTER — Telehealth: Payer: Self-pay | Admitting: Nurse Practitioner

## 2022-11-24 NOTE — Telephone Encounter (Signed)
Fax from [facility]  Celecoxib  100mg    90 day supply

## 2022-11-27 ENCOUNTER — Telehealth: Payer: Medicare Other

## 2022-11-27 ENCOUNTER — Telehealth: Payer: Self-pay | Admitting: *Deleted

## 2022-11-27 ENCOUNTER — Other Ambulatory Visit: Payer: Medicare Other | Admitting: *Deleted

## 2022-11-27 NOTE — Patient Outreach (Signed)
   Care Management RN Visit Note   11/27/22 Name: Paul [name] MRN: [id number]      DOB: 06/29/1950  Subjective: Paul [name] is a 72 y.o. year old male who is a primary care patient of Enid Skeens NP. Spoke with patient for scheduled telephone outreach, pt reports he is out of town and cannot talk today, ask outreach to be rescheduled.  Plan: Outreach patient on 12/01/22 at 1045 am  Irving Shows [facility], BSN [facility] Health/ Ambulatory Care Management [phone]

## 2022-11-28 ENCOUNTER — Telehealth: Payer: Self-pay | Admitting: Nurse Practitioner

## 2022-11-28 NOTE — Telephone Encounter (Signed)
Pt needs his celebrex 100 mg to be sent to [facility] DELIV[facility] - [place], [place] - [address]

## 2022-11-29 ENCOUNTER — Encounter: Payer: Medicare Other | Admitting: Podiatry

## 2022-11-29 ENCOUNTER — Other Ambulatory Visit: Payer: Self-pay

## 2022-11-29 DIAGNOSIS — M5030 Other cervical disc degeneration, unspecified cervical region: Secondary | ICD-10-CM

## 2022-11-29 DIAGNOSIS — Q762 Congenital spondylolisthesis: Secondary | ICD-10-CM

## 2022-11-29 MED ORDER — CELECOXIB 100 MG PO CAPS: 100.0000 mg | ORAL_CAPSULE | Freq: Two times a day (BID) | ORAL | 3 refills | Status: DC

## 2022-12-01 ENCOUNTER — Other Ambulatory Visit (HOSPITAL_COMMUNITY): Payer: Self-pay

## 2022-12-01 ENCOUNTER — Other Ambulatory Visit: Payer: Medicare Other | Admitting: *Deleted

## 2022-12-01 ENCOUNTER — Encounter: Payer: Self-pay | Admitting: *Deleted

## 2022-12-01 ENCOUNTER — Other Ambulatory Visit: Payer: Self-pay

## 2022-12-01 NOTE — Patient Outreach (Signed)
Care Management   Visit Note  12/01/2022 Name: Paul [name] MRN: [id number] DOB: Oct 12, 1950  Subjective: Paul [name] is a 72 y.o. year old male who is a primary care patient of Early, Sung Amabile, NP. The Care Management team was consulted for assistance.      Engaged with patient by telephone for follow up.   Goals Addressed             This Visit's Progress    CCM (COPD) EXPECTED OUTCOME: MONITOR, SELF-MANAGE AND REDUCE SYMTOM OF COPD       Current Barriers:  Knowledge Deficits related to COPD management Chronic Disease Management support and education needs related to COPD, action plan, falls Financial Constraints. - Child psychotherapist has worked with pt Patient reports he has all medications and taking as prescribed Patient has shower seat, walker, cane, has relatives he can call on if needed Patient has had several falls this past year due to having balance issues at times, no falls since last conversation Patient recently prescribed new medication for depression and pt unable to take due to nausea and other side effects, pt states he notified primary care provider about side effects and will not be taking the medication, will follow up with primary care provider in September, pt would like outreach from Child psychotherapist for management of depression, pt feels he has depression due to estrangement issues with his adult children  Planned Interventions: Advised patient to track and manage COPD triggers Provided instruction about proper use of medications used for management of COPD including inhalers Advised patient to self assesses COPD action plan zone and make appointment with provider if in the yellow zone for 48 hours without improvement Advised patient to engage in light exercise as tolerated 3-5 days a week to aid in the the management of COPD Provided education about and advised patient to utilize infection prevention strategies to reduce risk of respiratory infection Discussed the  importance of adequate rest and management of fatigue with COPD Reviewed safety precautions Depression screening completed PHQ-9=10, referral completed for social worker  Symptom Management: Take medications as prescribed   Attend all scheduled provider appointments Call pharmacy for medication refills 3-7 days in advance of running out of medications Attend church or other social activities Call provider office for new concerns or questions  identify and remove indoor air pollutants limit outdoor activity during cold weather listen for public air quality announcements every day do breathing exercises every day develop a rescue plan eliminate symptom triggers at home follow rescue plan if symptoms flare-up get at least 7 to 8 hours of sleep at night practice relaxation or meditation daily Follow COPD action plan- call your doctor early on for change in health status fall prevention strategies: change position slowly, use assistive device such as walker or cane (per provider recommendations) when walking, keep walkways clear, have good lighting in room. It is important to contact your provider if you have any falls, maintain muscle strength/tone by exercise per provider recommendations. Social worker will outreach you  Follow Up Plan: Telephone follow up appointment with care management team member scheduled for:  02/02/23 at 1045 am       CCM (DIABETES) EXPECTED OUTCOME: MONITOR, SELF-MANAGE AND REDUCE SYMPTOMS OF DIABETES       Current Barriers:  Knowledge Deficits related to Diabetes management Care Coordination needs related to social work needs in a patient with Diabetes Chronic Disease Management support and education needs related to Diabetes, diet Financial Constraints. -   Patient  reports CBG is checked with continuous glucose monitor, fasting ranges 100's with todays reading 187,  random ranges 100's, reports he has a friend that moved in with him and is cooking healthy  meals, feels like he has improvement in blood sugar due to eating healthy meals Patient reports he has low energy and does not exercise, has walker and cane and uses as needed  Planned Interventions: Provided education to patient about basic DM disease process; Reviewed medications with patient and discussed importance of medication adherence;        Counseled on importance of regular laboratory monitoring as prescribed;        Provided patient with written educational materials related to hypo and hyperglycemia and importance of correct treatment;       Advised patient, providing education and rationale, to check cbg CGM per pt  and record        Review of patient status, including review of consultants reports, relevant laboratory and other test results, and medications completed;       Reinforced safety precautions  Symptom Management: Take medications as prescribed   Attend all scheduled provider appointments Call pharmacy for medication refills 3-7 days in advance of running out of medications Attend church or other social activities Call provider office for new concerns or questions  Work with the social worker to address care coordination needs and will continue to work with the clinical team to address health care and disease management related needs check blood sugar at prescribed times: continuous glucose monitor per patient report  check feet daily for cuts, sores or redness enter blood sugar readings and medication or insulin into daily log take the blood sugar log to all doctor visits take the blood sugar meter to all doctor visits trim toenails straight across fill half of plate with vegetables limit fast food meals to no more than 1 per week manage portion size prepare main meal at home 3 to 5 days each week read food labels for fat, fiber, carbohydrates and portion size set a realistic goal keep feet up while sitting  Follow Up Plan: Telephone follow up appointment  with care management team member scheduled for:  02/02/23 at 1045 am       CCM (HYPERTENSION) EXPECTED OUTCOME: MONITOR, SELF-MANAGE AND REDUCE SYMPTOMS OF HYPERTENSION       Current Barriers:  Knowledge Deficits related to Hypertension management Chronic Disease Management support and education needs related to Hypertension, diet Financial Constraints.  Patient reports he has blood pressure cuff but does not monitor blood pressure  Planned Interventions: Evaluation of current treatment plan related to hypertension self management and patient's adherence to plan as established by provider;   Reviewed medications with patient and discussed importance of compliance;  Counseled on the importance of exercise goals with target of 150 minutes per week Discussed plans with patient for ongoing care management follow up and provided patient with direct contact information for care management team; Advised patient, providing education and rationale, to monitor blood pressure daily and record, calling PCP for findings outside established parameters;  Discussed complications of poorly controlled blood pressure such as heart disease, stroke, circulatory complications, vision complications, kidney impairment, sexual dysfunction;  Reviewed low sodium diet and importance of reading food labels  Symptom Management: Take medications as prescribed   Attend all scheduled provider appointments Call pharmacy for medication refills 3-7 days in advance of running out of medications Attend church or other social activities Call provider office for new concerns or questions  check blood pressure weekly choose a place to take my blood pressure (home, clinic or office, retail store) write blood pressure results in a log or diary learn about high blood pressure keep a blood pressure log take blood pressure log to all doctor appointments keep all doctor appointments take medications for blood pressure exactly as  prescribed report new symptoms to your doctor eat more whole grains, fruits and vegetables, lean meats and healthy fats Follow low sodium diet Read food labels for sodium content Limit/ avoid fast food  Follow Up Plan: Telephone follow up appointment with care management team member scheduled for:  02/02/23 at 1045 am           Plan: Telephone follow up appointment with care management team member scheduled for:  02/02/23 at 1045 am  Irving Shows [facility], BSN [place]/ Ambulatory Care Management [phone]

## 2022-12-01 NOTE — Patient Instructions (Signed)
Visit Information  Thank you for taking time to visit with me today. Please don't hesitate to contact me if I can be of assistance to you before our next scheduled telephone appointment.  Following are the goals we discussed today:   Goals Addressed             This Visit's Progress    CCM (COPD) EXPECTED OUTCOME: MONITOR, SELF-MANAGE AND REDUCE SYMTOM OF COPD       Current Barriers:  Knowledge Deficits related to COPD management Chronic Disease Management support and education needs related to COPD, action plan, falls Financial Constraints. - Child psychotherapist has worked with pt Patient reports he has all medications and taking as prescribed Patient has shower seat, walker, cane, has relatives he can call on if needed Patient has had several falls this past year due to having balance issues at times, no falls since last conversation Patient recently prescribed new medication for depression and pt unable to take due to nausea and other side effects, pt states he notified primary care provider about side effects and will not be taking the medication, will follow up with primary care provider in September, pt would like outreach from Child psychotherapist for management of depression, pt feels he has depression due to estrangement issues with his adult children  Planned Interventions: Advised patient to track and manage COPD triggers Provided instruction about proper use of medications used for management of COPD including inhalers Advised patient to self assesses COPD action plan zone and make appointment with provider if in the yellow zone for 48 hours without improvement Advised patient to engage in light exercise as tolerated 3-5 days a week to aid in the the management of COPD Provided education about and advised patient to utilize infection prevention strategies to reduce risk of respiratory infection Discussed the importance of adequate rest and management of fatigue with COPD Reviewed safety  precautions Depression screening completed PHQ-9=10, referral completed for social worker  Symptom Management: Take medications as prescribed   Attend all scheduled provider appointments Call pharmacy for medication refills 3-7 days in advance of running out of medications Attend church or other social activities Call provider office for new concerns or questions  identify and remove indoor air pollutants limit outdoor activity during cold weather listen for public air quality announcements every day do breathing exercises every day develop a rescue plan eliminate symptom triggers at home follow rescue plan if symptoms flare-up get at least 7 to 8 hours of sleep at night practice relaxation or meditation daily Follow COPD action plan- call your doctor early on for change in health status fall prevention strategies: change position slowly, use assistive device such as walker or cane (per provider recommendations) when walking, keep walkways clear, have good lighting in room. It is important to contact your provider if you have any falls, maintain muscle strength/tone by exercise per provider recommendations. Social worker will outreach you  Follow Up Plan: Telephone follow up appointment with care management team member scheduled for:  02/02/23 at 1045 am       CCM (DIABETES) EXPECTED OUTCOME: MONITOR, SELF-MANAGE AND REDUCE SYMPTOMS OF DIABETES       Current Barriers:  Knowledge Deficits related to Diabetes management Care Coordination needs related to social work needs in a patient with Diabetes Chronic Disease Management support and education needs related to Diabetes, diet Financial Constraints. -   Patient reports CBG is checked with continuous glucose monitor, fasting ranges 100's with todays reading 187,  random  ranges 100's, reports he has a friend that moved in with him and is cooking healthy meals, feels like he has improvement in blood sugar due to eating healthy  meals Patient reports he has low energy and does not exercise, has walker and cane and uses as needed  Planned Interventions: Provided education to patient about basic DM disease process; Reviewed medications with patient and discussed importance of medication adherence;        Counseled on importance of regular laboratory monitoring as prescribed;        Provided patient with written educational materials related to hypo and hyperglycemia and importance of correct treatment;       Advised patient, providing education and rationale, to check cbg CGM per pt  and record        Review of patient status, including review of consultants reports, relevant laboratory and other test results, and medications completed;       Reinforced safety precautions  Symptom Management: Take medications as prescribed   Attend all scheduled provider appointments Call pharmacy for medication refills 3-7 days in advance of running out of medications Attend church or other social activities Call provider office for new concerns or questions  Work with the social worker to address care coordination needs and will continue to work with the clinical team to address health care and disease management related needs check blood sugar at prescribed times: continuous glucose monitor per patient report  check feet daily for cuts, sores or redness enter blood sugar readings and medication or insulin into daily log take the blood sugar log to all doctor visits take the blood sugar meter to all doctor visits trim toenails straight across fill half of plate with vegetables limit fast food meals to no more than 1 per week manage portion size prepare main meal at home 3 to 5 days each week read food labels for fat, fiber, carbohydrates and portion size set a realistic goal keep feet up while sitting  Follow Up Plan: Telephone follow up appointment with care management team member scheduled for:  02/02/23 at 1045 am        CCM (HYPERTENSION) EXPECTED OUTCOME: MONITOR, SELF-MANAGE AND REDUCE SYMPTOMS OF HYPERTENSION       Current Barriers:  Knowledge Deficits related to Hypertension management Chronic Disease Management support and education needs related to Hypertension, diet Financial Constraints.  Patient reports he has blood pressure cuff but does not monitor blood pressure  Planned Interventions: Evaluation of current treatment plan related to hypertension self management and patient's adherence to plan as established by provider;   Reviewed medications with patient and discussed importance of compliance;  Counseled on the importance of exercise goals with target of 150 minutes per week Discussed plans with patient for ongoing care management follow up and provided patient with direct contact information for care management team; Advised patient, providing education and rationale, to monitor blood pressure daily and record, calling PCP for findings outside established parameters;  Discussed complications of poorly controlled blood pressure such as heart disease, stroke, circulatory complications, vision complications, kidney impairment, sexual dysfunction;  Reviewed low sodium diet and importance of reading food labels  Symptom Management: Take medications as prescribed   Attend all scheduled provider appointments Call pharmacy for medication refills 3-7 days in advance of running out of medications Attend church or other social activities Call provider office for new concerns or questions  check blood pressure weekly choose a place to take my blood pressure (home, clinic or office,  retail store) write blood pressure results in a log or diary learn about high blood pressure keep a blood pressure log take blood pressure log to all doctor appointments keep all doctor appointments take medications for blood pressure exactly as prescribed report new symptoms to your doctor eat more whole grains, fruits  and vegetables, lean meats and healthy fats Follow low sodium diet Read food labels for sodium content Limit/ avoid fast food  Follow Up Plan: Telephone follow up appointment with care management team member scheduled for:  02/02/23 at 1045 am           Our next appointment is by telephone on 02/02/23 at 1045 am  Please call the care guide team at [phone] if you need to cancel or reschedule your appointment.   If you are experiencing a Mental Health or Behavioral Health Crisis or need someone to talk to, please call the Suicide and Crisis Lifeline: 988 call the [place] National Suicide Prevention Lifeline: 210-352-3385 or TTY: (779)653-2996 TTY 205-829-5424) to talk to a trained counselor call 1-800-273-TALK (toll free, 24 hour hotline) go to [facility] [address] [place] [phone]) call 911   The patient verbalized understanding of instructions, educational materials, and care plan provided today and DECLINED offer to receive copy of patient instructions, educational materials, and care plan.   Telephone follow up appointment with care management team member scheduled for:  02/02/23 at 1045 am  Irving Shows [facility], BSN [place]/ Ambulatory Care Management [phone]

## 2022-12-06 ENCOUNTER — Telehealth: Payer: Self-pay | Admitting: *Deleted

## 2022-12-06 NOTE — Progress Notes (Signed)
Scheduled 8/27  Thank you   [facility]  Care Coordination Care Guide  Direct Dial: [phone]

## 2022-12-06 NOTE — Progress Notes (Signed)
  Care Coordination   Note   12/06/2022 Name: Paul [name] MRN: [id number] DOB: March 28, 1951  Trindon Stimac is a 72 y.o. year old male who sees Early, Sung Amabile, NP for primary care. I reached out to [name] by phone today to offer care coordination services.  Mr. Gonnerman was given information about Care Coordination services today including:   The Care Coordination services include support from the care team which includes your Nurse Coordinator, Clinical Social Worker, or Pharmacist.  The Care Coordination team is here to help remove barriers to the health concerns and goals most important to you. Care Coordination services are voluntary, and the patient may decline or stop services at any time by request to their care team member.   Care Coordination Consent Status: Patient agreed to services and verbal consent obtained.   Follow up plan:  Telephone appointment with care coordination team member scheduled for:  12/12/22  Encounter Outcome:  Pt. Scheduled  [facility] Coordination Care Guide  Direct Dial: [phone]

## 2022-12-07 ENCOUNTER — Other Ambulatory Visit (HOSPITAL_COMMUNITY): Payer: Self-pay

## 2022-12-08 ENCOUNTER — Other Ambulatory Visit (HOSPITAL_COMMUNITY): Payer: Self-pay

## 2022-12-08 ENCOUNTER — Other Ambulatory Visit: Payer: Medicare Other

## 2022-12-08 ENCOUNTER — Telehealth: Payer: Self-pay | Admitting: Nurse Practitioner

## 2022-12-08 MED ORDER — FREESTYLE LIBRE 3 READER DEVI
1 refills | Status: AC
Start: 2022-12-08 — End: ?
  Filled 2022-12-08: qty 1, 1d supply, fill #0

## 2022-12-08 NOTE — Telephone Encounter (Signed)
Paul [name] needs help programing a newer freestyle [name] and asks if a nurse can give him a call.

## 2022-12-08 NOTE — Telephone Encounter (Signed)
Pt will come in for a visit

## 2022-12-11 ENCOUNTER — Ambulatory Visit: Payer: Medicare Other | Admitting: Medical

## 2022-12-11 ENCOUNTER — Other Ambulatory Visit (HOSPITAL_COMMUNITY): Payer: Self-pay

## 2022-12-12 ENCOUNTER — Ambulatory Visit: Payer: Self-pay | Admitting: Licensed Clinical Social Worker

## 2022-12-13 ENCOUNTER — Other Ambulatory Visit (HOSPITAL_COMMUNITY): Payer: Self-pay

## 2022-12-13 ENCOUNTER — Telehealth: Payer: Self-pay | Admitting: Internal Medicine

## 2022-12-13 ENCOUNTER — Ambulatory Visit
Admission: RE | Admit: 2022-12-13 | Discharge: 2022-12-13 | Disposition: A | Discharge status: Home / Self Care | Payer: Medicare Other | Source: Ambulatory Visit | Attending: Medical | Admitting: Medical

## 2022-12-13 ENCOUNTER — Ambulatory Visit (INDEPENDENT_AMBULATORY_CARE_PROVIDER_SITE_OTHER): Payer: Medicare Other | Admitting: Medical

## 2022-12-13 VITALS — BP 104/64 | HR 66 | Temp 97.8°F | Wt 182.2 lb

## 2022-12-13 DIAGNOSIS — R229 Localized swelling, mass and lump, unspecified: Secondary | ICD-10-CM

## 2022-12-13 DIAGNOSIS — M79642 Pain in left hand: Secondary | ICD-10-CM

## 2022-12-13 DIAGNOSIS — L089 Local infection of the skin and subcutaneous tissue, unspecified: Secondary | ICD-10-CM

## 2022-12-13 DIAGNOSIS — L989 Disorder of the skin and subcutaneous tissue, unspecified: Secondary | ICD-10-CM | POA: Diagnosis not present

## 2022-12-13 DIAGNOSIS — Z794 Long term (current) use of insulin: Secondary | ICD-10-CM | POA: Diagnosis not present

## 2022-12-13 DIAGNOSIS — I509 Heart failure, unspecified: Secondary | ICD-10-CM

## 2022-12-13 DIAGNOSIS — Q762 Congenital spondylolisthesis: Secondary | ICD-10-CM | POA: Diagnosis not present

## 2022-12-13 DIAGNOSIS — E1122 Type 2 diabetes mellitus with diabetic chronic kidney disease: Secondary | ICD-10-CM

## 2022-12-13 DIAGNOSIS — M5030 Other cervical disc degeneration, unspecified cervical region: Secondary | ICD-10-CM

## 2022-12-13 DIAGNOSIS — N1831 Chronic kidney disease, stage 3a: Secondary | ICD-10-CM

## 2022-12-13 MED ORDER — AMOXICILLIN-POT CLAVULANATE 875-125 MG PO TABS
1.0000 | ORAL_TABLET | Freq: Two times a day (BID) | ORAL | 0 refills | Status: DC
  Filled 2022-12-13: qty 20, 10d supply, fill #0

## 2022-12-13 NOTE — Telephone Encounter (Signed)
Shane filled handicap placard out for pt while he was here

## 2022-12-13 NOTE — Telephone Encounter (Signed)
Pt came in today for a acute visit but brought in handicap placard. I advised him that you would have to fill out form. He states that his previous doctor before seeing you that filled out form. He has trouble walking distance, he has oxygen at home if he needs it. And has to walk with an assistance device like cane.

## 2022-12-13 NOTE — Progress Notes (Signed)
Subjective:  Paul [name] is a 72 y.o. male who presents for Chief Complaint  Patient presents with   infected hand    Infected hand several months ago and keeps bumping it and won't heal. Hurts alot     Here for issues and infected hand.  For about the last month has had a red swollen place on his left hand.  He first had an injury crawling under the deck trying to mess with the dryer vent.  He thinks he nicked his hand on the wood.  Since then he has had a wound that keeps wanting to get infected but it will not heal.  He is not sure if he had any foreign body entering the skin or not.  He also has questions about the freestyle libre system, having some trouble using this.  He also has a handicap placard that needs renewal  No other aggravating or relieving factors.    No other c/o.  Past Medical History:  Diagnosis Date   Angina, class III (HCC)    Anxiety    Arthritis    Bilateral carotid artery stenosis    BPH without obstruction/lower urinary tract symptoms    CAD 11/24/2011   a) Mild-to-moderate 30-40% lesions in the RCA, LAD and Circumflex. b) CULPRIT LESION: long tubular 70-80% lesion in D1 with FFR of 0.7 --> PCI w/ Xience Xpedition DES 2.5 mm x 30 mm (2.65 MM); c) Lexiscan Myoview 11/2013: No Ischemia or Infarct (Inferior Gut Attenuation) EF 63%.   Chronic fatigue syndrome 06/13/2021   Chronic heart failure (HCC)    Chronic kidney disease (CKD), stage III (moderate) (HCC)    Chronic pain syndrome    Cigarette smoker 02/22/2014   Cirrhosis (HCC)    Constipation by delayed colonic transit 06/13/2021   COPD (chronic obstructive pulmonary disease) (HCC)    "don't have full case of it; I'm right there at it"   Depression    Diabetes mellitus without complication (HCC)    Diverticulosis    Dysphagia    Elevated alkaline phosphatase level 08/17/2022   Elevated levels of transaminase & lactic acid dehydrogenase 06/09/2016   Essential hypertension 11/24/2011   Exertional  dyspnea, chronic    Former smoker    GERD (gastroesophageal reflux disease)    Gout    H/O: GI bleed    Hearing loss    Hyperglycemia 03/11/2019   Hyperlipidemia with target LDL less than 70 11/24/2011   Hypo-osmolality and hyponatremia 03/11/2019   Iron deficiency anemia    Lipoprotein deficiency disorder    Long term (current) use of insulin (HCC) 07/15/2019   Obesity (BMI 30.0-34.9)    OSA (obstructive sleep apnea), uses oxygen at home did not tolerate cpap 11/24/2011   Pain due to onychomycosis of toenails of both feet 08/23/2022   Paresthesia of skin 02/17/2015   Peripheral vascular disease (HCC)    Portal hypertension (HCC)    Right hip pain 06/13/2021   S/P CABG (coronary artery bypass graft)    Shoulder joint pain 02/17/2015   Spinal stenosis    Splenomegaly    Thrombocytopenia (HCC)    Vitamin D deficiency    Current Outpatient Medications on File Prior to Visit  Medication Sig Dispense Refill   acetaminophen (TYLENOL) 500 MG tablet Take 1,500-2,000 mg by mouth daily.     albuterol (VENTOLIN HFA) 108 (90 Base) MCG/ACT inhaler Inhale 2 puffs into the lungs every 4 (four) hours as needed. For breathing. 18 g 3   allopurinol (  ZYLOPRIM) 100 MG tablet Take 1 tablet (100 mg total) by mouth daily. 90 tablet 3   amLODipine (NORVASC) 5 MG tablet Take 1 tablet (5 mg total) by mouth daily. 90 tablet 3   aspirin 81 MG EC tablet Take 1 tablet (81 mg total) by mouth daily. 30 tablet 0   celecoxib (CELEBREX) 100 MG capsule Take 1 capsule (100 mg total) by mouth 2 (two) times daily. 180 capsule 3   DULoxetine (CYMBALTA) 20 MG capsule Take 1 capsule (20 mg total) by mouth daily. 30 capsule 3   gabapentin (NEURONTIN) 300 MG capsule Take 2 capsules (600 mg total) by mouth 3 (three) times daily. For back and foot pain. 540 capsule 3   HYDROcodone-acetaminophen (NORCO/VICODIN) 5-325 MG tablet Take 1-2 tablets by mouth in the morning AND 1 tablet daily at 12 noon AND 1 tablet at bedtime. 120  tablet 0   HYDROcodone-acetaminophen (NORCO/VICODIN) 5-325 MG tablet Take 1-2 tablets by mouth in the morning AND 1 tablet daily at 12 noon AND 1 tablet at bedtime. (11/30/22) 120 tablet 0   HYDROcodone-acetaminophen (NORCO/VICODIN) 5-325 MG tablet Take 1-2 tablets by mouth in the morning AND 1 tablet daily at 12 noon AND 1 tablet at bedtime. (10/31/22) 120 tablet 0   insulin glargine, 1 Unit Dial, (TOUJEO SOLOSTAR) 300 UNIT/ML Solostar Pen Inject 40 Units into the skin daily. 18 mL 3   insulin lispro (HUMALOG KWIKPEN) 200 UNIT/ML KwikPen INJECT 14 UNITS UNDER THE SKIN EVERY MORNING, 14 UNITS WITH LUNCH AND 14 UNITS WITH SUPPER, DAILY 12 mL 5   Iron, Ferrous Sulfate, 325 (65 Fe) MG TABS Take 1 tablet (325 mg) by mouth daily. 30 tablet 3   metFORMIN (GLUCOPHAGE-XR) 500 MG 24 hr tablet Take 2 tablets (1,000 mg total) by mouth 2 (two) times daily. 360 tablet 3   metoprolol tartrate (LOPRESSOR) 25 MG tablet Take 1 tablet (25 mg total) by mouth 2 (two) times daily. 180 tablet 3   pantoprazole (PROTONIX) 40 MG tablet Take 1 tablet (40 mg total) by mouth daily. 90 tablet 3   potassium chloride (KLOR-CON M) 10 MEQ tablet Take 1 tablet (10 mEq total) by mouth daily. 90 tablet 3   rosuvastatin (CRESTOR) 20 MG tablet Take 1 tablet (20 mg total) by mouth daily. 90 tablet 3   umeclidinium-vilanterol (ANORO ELLIPTA) 62.5-25 MCG/ACT AEPB Inhale 1 puff into the lungs daily. 90 each 3   zolpidem (AMBIEN) 10 MG tablet Take 1 tablet (10 mg total) by mouth at bedtime as needed for sleep 30 tablet 2   AMBULATORY NON FORMULARY MEDICATION Medical alert device as covered by insurance for frequent falls and weakness. (Patient not taking: Reported on 11/21/2022) 1 each 0   Continuous Glucose Receiver (FREESTYLE LIBRE 3 READER) DEVI Use with freestyle libre 3 sensor 1 each 1   Continuous Glucose Sensor (FREESTYLE LIBRE 3 SENSOR) MISC      glucose blood (ONETOUCH VERIO) test strip Use to check blood glucose 3 times daily 300 each 3    Insulin Pen Needle 32G X 4 MM MISC Use daily with insulin as directed 100 each 3   Tetrahydrozoline HCl (VISINE EXTRA OP) Place 1 drop into both eyes as needed (itching).     No current facility-administered medications on file prior to visit.     The following portions of the patient's history were reviewed and updated as appropriate: allergies, current medications, past family history, past medical history, past social history, past surgical history and problem list.  ROS Otherwise as in subjective above  Objective: BP 104/64   Pulse 66   Temp 97.8 F (36.6 C)   Wt 182 lb 3.2 oz (82.6 kg)   BMI 30.32 kg/m   General appearance: alert, no distress, well developed, well nourished Skin: Left dorsal hand over the second digit just proximal to the MCP with a 2 cm raised pinkish-red lesion that is tender but no fluctuance or induration.  There is some central eschar.  No other skin lesions noted Hands neurovascularly intact   Assessment: Encounter Diagnoses  Name Primary?   Left hand pain Yes   Soft tissue swelling    Skin infection    Type 2 diabetes mellitus with stage 3a chronic kidney disease, with long-term current use of insulin (HCC)    Spondylolisthesis, congenital    DDD (degenerative disc disease), cervical    Chronic heart failure, unspecified heart failure type (HCC)      Plan: Skin lesion of left hand-we discussed possibly doing anesthetic and incision and drainage but he does not want to do that today.  We will send for x-ray to help evaluate for any foreign body.  Begin antibiotic below.  We also discussed the possible need to go see dermatology as I cannot rule out a skin cancer as well  Diabetes-nursing discussed his freestyle libre and helped him with proper use  I completed his handicap placard form today given history of DDD, heart failure   Mykail was seen today for infected hand.  Diagnoses and all orders for this visit:  Left hand pain -     DG  Hand 2 View Left; Future  Soft tissue swelling -     DG Hand 2 View Left; Future  Skin infection -     DG Hand 2 View Left; Future  Type 2 diabetes mellitus with stage 3a chronic kidney disease, with long-term current use of insulin (HCC)  Spondylolisthesis, congenital  DDD (degenerative disc disease), cervical  Chronic heart failure, unspecified heart failure type (HCC)  Other orders -     amoxicillin-clavulanate (AUGMENTIN) 875-125 MG tablet; Take 1 tablet by mouth 2 (two) times daily.    Follow up: pending xray

## 2022-12-13 NOTE — Patient Instructions (Signed)
Please go to [facility] for your hand xray.   Their hours are 8am - 4:30 pm Monday - Friday.  Take your insurance card with you.  [facility] [phone]   [address] [place], [place] [address]

## 2022-12-15 ENCOUNTER — Other Ambulatory Visit: Payer: Self-pay

## 2022-12-15 NOTE — Patient Instructions (Signed)
Visit Information  Thank you for taking time to visit with me today. Please don't hesitate to contact me if I can be of assistance to you.   Following are the goals we discussed today:   Goals Addressed             This Visit's Progress    LCSW-Plan of Care MH Symptoms   On track    Activities and task to complete in order to accomplish goals.   Keep all upcoming appointments discussed today Continue with compliance of taking medication prescribed by Doctor Implement healthy coping skills discussed to assist with management of symptoms         Our next appointment is by telephone on 09/24 at 1 PM  Please call the care guide team at [phone] if you need to cancel or reschedule your appointment.   If you are experiencing a Mental Health or Behavioral Health Crisis or need someone to talk to, please call the Suicide and Crisis Lifeline: 988 call 911   The patient verbalized understanding of instructions, educational materials, and care plan provided today and DECLINED offer to receive copy of patient instructions, educational materials, and care plan.   Jenel Lucks, MSW, LCSW [facility] Care Management [place]  [facility] [email or web address] Phone [phone] 5:17 AM

## 2022-12-15 NOTE — Patient Outreach (Signed)
  Care Coordination   Initial Visit Note   12/12/2022 Name: Paul [name] MRN: [id number] DOB: May 06, 1950  Paul [name] is a 72 y.o. year old male who sees Early, Sung Amabile, NP for primary care. I spoke with  [name] by phone today.  What matters to the patients health and wellness today?  Symptom Management    Goals Addressed             This Visit's Progress    LCSW-Plan of Care MH Symptoms   On track    Activities and task to complete in order to accomplish goals.   Keep all upcoming appointments discussed today Continue with compliance of taking medication prescribed by Doctor Implement healthy coping skills discussed to assist with management of symptoms         SDOH assessments and interventions completed:  No     Care Coordination Interventions:  Yes, provided  Interventions Today    Flowsheet Row Most Recent Value  Chronic Disease   Chronic disease during today's visit Hypertension (HTN), Diabetes, Congestive Heart Failure (CHF), Chronic Kidney Disease/End Stage Renal Disease (ESRD)  [Anxiety, Depression, CAD]  General Interventions   General Interventions Discussed/Reviewed General Interventions Discussed, Programmer, applications, Doctor Visits  Doctor Visits Discussed/Reviewed Doctor Visits Discussed  Mental Health Interventions   Mental Health Discussed/Reviewed Mental Health Discussed, Coping Strategies, Anxiety, Depression  Nutrition Interventions   Nutrition Discussed/Reviewed Nutrition Discussed  Pharmacy Interventions   Pharmacy Dicussed/Reviewed Pharmacy Topics Discussed  Safety Interventions   Safety Discussed/Reviewed Safety Discussed       Follow up plan: Follow up call scheduled for 2-4 weeks    Encounter Outcome:  Pt. Visit Completed   Jenel Lucks, MSW, LCSW [facility] Care Management [facility]  [facility] [name] Phone [phone] 5:16 AM

## 2022-12-21 NOTE — Progress Notes (Signed)
X-ray shows no obvious foreign body.  If not much improved on recheck for possible procedure

## 2022-12-25 ENCOUNTER — Encounter: Payer: Self-pay | Admitting: Medical

## 2022-12-25 ENCOUNTER — Ambulatory Visit (INDEPENDENT_AMBULATORY_CARE_PROVIDER_SITE_OTHER): Payer: Medicare Other | Admitting: Medical

## 2022-12-25 DIAGNOSIS — L989 Disorder of the skin and subcutaneous tissue, unspecified: Secondary | ICD-10-CM | POA: Diagnosis not present

## 2022-12-25 DIAGNOSIS — Z23 Encounter for immunization: Secondary | ICD-10-CM

## 2022-12-25 DIAGNOSIS — E1122 Type 2 diabetes mellitus with diabetic chronic kidney disease: Secondary | ICD-10-CM

## 2022-12-25 DIAGNOSIS — N1831 Chronic kidney disease, stage 3a: Secondary | ICD-10-CM

## 2022-12-25 DIAGNOSIS — Z794 Long term (current) use of insulin: Secondary | ICD-10-CM

## 2022-12-25 NOTE — Progress Notes (Signed)
Subjective:  Paul [name] is a 72 y.o. male who presents for Chief Complaint  Patient presents with   Procedure    Wants to have bump on left hand taken off. He said he has felt "weird" and has had a HA since he started the ABX given at last visit.      Here for recheck on hand lesion.  I saw him 12/13/2022 for a skin lesion of the left hand.  He noted at that time he had been swollen for about a month.  He had been crawling under a deck and initially thought he might have nicked the hand but was not sure he got any foreign body in the hand.  When he was here last time there was more redness and he did not want to have incision and drainage at that time so we did a round of antibiotic and sent him for x-ray.  He took all the antibiotic which made him feel little sick.  He did go for x-ray which did not show any foreign body such as wood.  He does not have any reason to suspect glass or plastic in the wound.  Today he rechecks as the lesion is basically unchanged at this point.  No other aggravating or relieving factors.    No other c/o.  Past Medical History:  Diagnosis Date   Angina, class III (HCC)    Anxiety    Arthritis    Bilateral carotid artery stenosis    BPH without obstruction/lower urinary tract symptoms    CAD 11/24/2011   a) Mild-to-moderate 30-40% lesions in the RCA, LAD and Circumflex. b) CULPRIT LESION: long tubular 70-80% lesion in D1 with FFR of 0.7 --> PCI w/ Xience Xpedition DES 2.5 mm x 30 mm (2.65 MM); c) Lexiscan Myoview 11/2013: No Ischemia or Infarct (Inferior Gut Attenuation) EF 63%.   Chronic fatigue syndrome 06/13/2021   Chronic heart failure (HCC)    Chronic kidney disease (CKD), stage III (moderate) (HCC)    Chronic pain syndrome    Cigarette smoker 02/22/2014   Cirrhosis (HCC)    Constipation by delayed colonic transit 06/13/2021   COPD (chronic obstructive pulmonary disease) (HCC)    "don't have full case of it; I'm right there at it"   Depression     Diabetes mellitus without complication (HCC)    Diverticulosis    Dysphagia    Elevated alkaline phosphatase level 08/17/2022   Elevated levels of transaminase & lactic acid dehydrogenase 06/09/2016   Essential hypertension 11/24/2011   Exertional dyspnea, chronic    Former smoker    GERD (gastroesophageal reflux disease)    Gout    H/O: GI bleed    Hearing loss    Hyperglycemia 03/11/2019   Hyperlipidemia with target LDL less than 70 11/24/2011   Hypo-osmolality and hyponatremia 03/11/2019   Iron deficiency anemia    Lipoprotein deficiency disorder    Long term (current) use of insulin (HCC) 07/15/2019   Obesity (BMI 30.0-34.9)    OSA (obstructive sleep apnea), uses oxygen at home did not tolerate cpap 11/24/2011   Pain due to onychomycosis of toenails of both feet 08/23/2022   Paresthesia of skin 02/17/2015   Peripheral vascular disease (HCC)    Portal hypertension (HCC)    Right hip pain 06/13/2021   S/P CABG (coronary artery bypass graft)    Shoulder joint pain 02/17/2015   Spinal stenosis    Splenomegaly    Thrombocytopenia (HCC)    Vitamin D deficiency  Current Outpatient Medications on File Prior to Visit  Medication Sig Dispense Refill   allopurinol (ZYLOPRIM) 100 MG tablet Take 1 tablet (100 mg total) by mouth daily. 90 tablet 3   AMBULATORY NON FORMULARY MEDICATION Medical alert device as covered by insurance for frequent falls and weakness. 1 each 0   amLODipine (NORVASC) 5 MG tablet Take 1 tablet (5 mg total) by mouth daily. 90 tablet 3   aspirin 81 MG EC tablet Take 1 tablet (81 mg total) by mouth daily. 30 tablet 0   celecoxib (CELEBREX) 100 MG capsule Take 1 capsule (100 mg total) by mouth 2 (two) times daily. 180 capsule 3   Continuous Glucose Receiver (FREESTYLE LIBRE 3 READER) DEVI Use with freestyle libre 3 sensor 1 each 1   Continuous Glucose Sensor (FREESTYLE LIBRE 3 SENSOR) MISC      DULoxetine (CYMBALTA) 20 MG capsule Take 1 capsule (20 mg total) by  mouth daily. 30 capsule 3   gabapentin (NEURONTIN) 300 MG capsule Take 2 capsules (600 mg total) by mouth 3 (three) times daily. For back and foot pain. 540 capsule 3   glucose blood (ONETOUCH VERIO) test strip Use to check blood glucose 3 times daily 300 each 3   HYDROcodone-acetaminophen (NORCO/VICODIN) 5-325 MG tablet Take 1-2 tablets by mouth in the morning AND 1 tablet daily at 12 noon AND 1 tablet at bedtime. 120 tablet 0   insulin glargine, 1 Unit Dial, (TOUJEO SOLOSTAR) 300 UNIT/ML Solostar Pen Inject 40 Units into the skin daily. 18 mL 3   insulin lispro (HUMALOG KWIKPEN) 200 UNIT/ML KwikPen INJECT 14 UNITS UNDER THE SKIN EVERY MORNING, 14 UNITS WITH LUNCH AND 14 UNITS WITH SUPPER, DAILY 12 mL 5   Insulin Pen Needle 32G X 4 MM MISC Use daily with insulin as directed 100 each 3   Iron, Ferrous Sulfate, 325 (65 Fe) MG TABS Take 1 tablet (325 mg) by mouth daily. 30 tablet 3   metFORMIN (GLUCOPHAGE-XR) 500 MG 24 hr tablet Take 2 tablets (1,000 mg total) by mouth 2 (two) times daily. 360 tablet 3   metoprolol tartrate (LOPRESSOR) 25 MG tablet Take 1 tablet (25 mg total) by mouth 2 (two) times daily. 180 tablet 3   pantoprazole (PROTONIX) 40 MG tablet Take 1 tablet (40 mg total) by mouth daily. 90 tablet 3   potassium chloride (KLOR-CON M) 10 MEQ tablet Take 1 tablet (10 mEq total) by mouth daily. 90 tablet 3   rosuvastatin (CRESTOR) 20 MG tablet Take 1 tablet (20 mg total) by mouth daily. 90 tablet 3   Tetrahydrozoline HCl (VISINE EXTRA OP) Place 1 drop into both eyes as needed (itching).     umeclidinium-vilanterol (ANORO ELLIPTA) 62.5-25 MCG/ACT AEPB Inhale 1 puff into the lungs daily. 90 each 3   zolpidem (AMBIEN) 10 MG tablet Take 1 tablet (10 mg total) by mouth at bedtime as needed for sleep 30 tablet 2   acetaminophen (TYLENOL) 500 MG tablet Take 1,500-2,000 mg by mouth daily. (Patient not taking: Reported on 12/25/2022)     albuterol (VENTOLIN HFA) 108 (90 Base) MCG/ACT inhaler Inhale 2  puffs into the lungs every 4 (four) hours as needed. For breathing. (Patient not taking: Reported on 12/25/2022) 18 g 3   amoxicillin-clavulanate (AUGMENTIN) 875-125 MG tablet Take 1 tablet by mouth 2 (two) times daily. (Patient not taking: Reported on 12/25/2022) 20 tablet 0   HYDROcodone-acetaminophen (NORCO/VICODIN) 5-325 MG tablet Take 1-2 tablets by mouth in the morning AND 1 tablet daily at  12 noon AND 1 tablet at bedtime. (11/30/22) (Patient not taking: Reported on 12/25/2022) 120 tablet 0   HYDROcodone-acetaminophen (NORCO/VICODIN) 5-325 MG tablet Take 1-2 tablets by mouth in the morning AND 1 tablet daily at 12 noon AND 1 tablet at bedtime. (10/31/22) (Patient not taking: Reported on 12/25/2022) 120 tablet 0   No current facility-administered medications on file prior to visit.     The following portions of the patient's history were reviewed and updated as appropriate: allergies, current medications, past family history, past medical history, past social history, past surgical history and problem list.  ROS Otherwise as in subjective above    Objective: BP 110/60   Pulse 67   Ht 5\' 5"  (1.651 m)   Wt 178 lb (80.7 kg)   BMI 29.62 kg/m   General appearance: alert, no distress, well developed, well nourished Left dorsal hand over the second digit just proximal to the MCP with a raised pinkish dense 1.5 to 2 cm diameter lesion with a central depression.  Compared to last visit no signs of infection or redness today.  Mostly unchanged.  It is slightly tender to touch.  No induration or fluctuance or warmth.      Assessment: Encounter Diagnoses  Name Primary?   Skin lesion Yes   Need for influenza vaccination    Type 2 diabetes mellitus with stage 3a chronic kidney disease, with long-term current use of insulin (HCC)      Plan: Skin lesion-recent x-ray does not show any foreign body.  He finished a round of antibiotics.  Given the appearance of the lesion today I am going to reach  out to plastic surgery for possible referral as I wonder more about a skin growth at this point  Counseled on the influenza virus vaccine.  Vaccine information sheet given.  Influenza vaccine given after consent obtained.  Diabetes-he recently started using freestyle libre and is feeling like he has better control over his sugars now.  He is still compliant with his medication but then improved readings from recent weeks.   Demari was seen today for procedure.  Diagnoses and all orders for this visit:  Skin lesion  Need for influenza vaccination -     Flu Vaccine Trivalent High Dose (Fluad)  Type 2 diabetes mellitus with stage 3a chronic kidney disease, with long-term current use of insulin (HCC)    Follow up: pending xray

## 2022-12-27 ENCOUNTER — Other Ambulatory Visit (HOSPITAL_COMMUNITY): Payer: Self-pay

## 2022-12-27 MED ORDER — HYDROCODONE-ACETAMINOPHEN 5-325 MG PO TABS
ORAL_TABLET | ORAL | 0 refills | Status: DC
Start: 2023-02-28 — End: 2023-04-02
  Filled 2023-02-28: qty 120, 30d supply, fill #0

## 2022-12-27 MED ORDER — ZTLIDO 1.8 % EX PTCH
1.0000 | MEDICATED_PATCH | Freq: Every day | CUTANEOUS | 2 refills | Status: DC
  Filled 2022-12-27: qty 30, 30d supply, fill #0

## 2022-12-27 MED ORDER — HYDROCODONE-ACETAMINOPHEN 5-325 MG PO TABS
ORAL_TABLET | ORAL | 0 refills | Status: DC
Start: 2022-12-31 — End: 2023-02-26
  Filled 2023-01-01: qty 120, 30d supply, fill #0

## 2022-12-27 MED ORDER — HYDROCODONE-ACETAMINOPHEN 5-325 MG PO TABS
ORAL_TABLET | ORAL | 0 refills | Status: DC
Start: 2023-01-29 — End: 2023-02-26
  Filled 2023-01-29: qty 120, 30d supply, fill #0

## 2022-12-27 NOTE — Addendum Note (Signed)
Addended by: Jac Canavan on: 12/27/2022 09:49 AM   Modules accepted: Orders

## 2022-12-28 ENCOUNTER — Telehealth: Payer: Self-pay

## 2022-12-28 NOTE — Telephone Encounter (Signed)
Reminder letter mailed to the patient.

## 2023-01-01 ENCOUNTER — Other Ambulatory Visit (HOSPITAL_COMMUNITY): Payer: Self-pay

## 2023-01-02 NOTE — Progress Notes (Signed)
Office Visit    Patient Name: Paul [name] Date of Encounter: 01/08/2023  PCP:  Tollie Eth, NP   [facility]  Cardiologist:  Marianita Botkin [place], MD  Advanced Practice Provider:  No care team member to display Electrophysiologist:  None }  HPI    Paul [name] is a 72 y.o. male with past medical history of CAD status PCI subsequent CABG 01/25/2021 (LIMA to LAD free graft, jump off vein graft proximally, R SVG to OM, type 2 diabetes mellitus, hyperlipidemia, COPD, HTN, OSA, and mild carotid artery disease presents today for follow-up appointment.  Previously seen by Dr. Herbie Baltimore with history of CAD and PCI to D1 11/2011.  He was seen 12/13/2020 by me noting increasing precordial pain and exertional dyspnea.  Cardiac catheterization 12/28/2020 showed extensive LAD and circumflex disease with occluded diagonal and recommended for cardiothoracic surgery consult.  Underwent CABG x 2 on 01/25/2021 by Dr. Cliffton Asters.   He was seen by Eligha Bridegroom, NP 04/03/2022 for evaluation of shortness of breath and fatigue.  He could not complete his normal activities.  He endorsed exhaustion by walking to the office that day.  Additionally, activity was limited by chronic back pain.  Echo and Myoview studies were done and were normal.   On prior labs in Feb LDL was elevated but his statin had been discontinued. Crestor was resumed but lipids have not been retested. He reports he is doing better with diet and has lost 6 lbs. He still gets SOB easily. Has weakness in arms and legs. He denies any chest pain.    Past Medical History    Past Medical History:  Diagnosis Date   Angina, class III (HCC)    Anxiety    Arthritis    Bilateral carotid artery stenosis    BPH without obstruction/lower urinary tract symptoms    CAD 11/24/2011   a) Mild-to-moderate 30-40% lesions in the RCA, LAD and Circumflex. b) CULPRIT LESION: long tubular 70-80% lesion in D1 with FFR of 0.7 --> PCI w/ Xience  Xpedition DES 2.5 mm x 30 mm (2.65 MM); c) Lexiscan Myoview 11/2013: No Ischemia or Infarct (Inferior Gut Attenuation) EF 63%.   Chronic fatigue syndrome 06/13/2021   Chronic heart failure (HCC)    Chronic kidney disease (CKD), stage III (moderate) (HCC)    Chronic pain syndrome    Cigarette smoker 02/22/2014   Cirrhosis (HCC)    Constipation by delayed colonic transit 06/13/2021   COPD (chronic obstructive pulmonary disease) (HCC)    "don't have full case of it; I'm right there at it"   Depression    Diabetes mellitus without complication (HCC)    Diverticulosis    Dysphagia    Elevated alkaline phosphatase level 08/17/2022   Elevated levels of transaminase & lactic acid dehydrogenase 06/09/2016   Essential hypertension 11/24/2011   Exertional dyspnea, chronic    Former smoker    GERD (gastroesophageal reflux disease)    Gout    H/O: GI bleed    Hearing loss    Hyperglycemia 03/11/2019   Hyperlipidemia with target LDL less than 70 11/24/2011   Hypo-osmolality and hyponatremia 03/11/2019   Iron deficiency anemia    Lipoprotein deficiency disorder    Long term (current) use of insulin (HCC) 07/15/2019   Obesity (BMI 30.0-34.9)    OSA (obstructive sleep apnea), uses oxygen at home did not tolerate cpap 11/24/2011   Pain due to onychomycosis of toenails of both feet 08/23/2022   Paresthesia of skin  02/17/2015   Peripheral vascular disease (HCC)    Portal hypertension (HCC)    Right hip pain 06/13/2021   S/P CABG (coronary artery bypass graft)    Shoulder joint pain 02/17/2015   Spinal stenosis    Splenomegaly    Thrombocytopenia (HCC)    Vitamin D deficiency    Past Surgical History:  Procedure Laterality Date   CERVICAL SPINE SURGERY  2012   CORONARY ANGIOPLASTY WITH STENT PLACEMENT  11/23/2011   "1; first one"   CORONARY ARTERY BYPASS GRAFT N/A 01/25/2021   Procedure: CORONARY ARTERY BYPASS GRAFTING (CABG) X2, USING LEFT INTERNAL MAMMARY ARTERY AND LEFT LEG GREATER  SAPHENOUS VEIN HARVESTED ENDOSCOPICALLY;  Surgeon: Corliss Skains, MD;  Location: [facility] OR;  Service: Open Heart Surgery;  Laterality: N/A;   ENDOVEIN HARVEST OF GREATER SAPHENOUS VEIN Bilateral 01/25/2021   Procedure: ENDOVEIN HARVEST OF GREATER SAPHENOUS VEIN;  Surgeon: Corliss Skains, MD;  Location: [facility] OR;  Service: Open Heart Surgery;  Laterality: Bilateral;   LEFT HEART CATH AND CORONARY ANGIOGRAPHY N/A 12/28/2020   Procedure: LEFT HEART CATH AND CORONARY ANGIOGRAPHY;  Surgeon: Marykay Lex, MD;  Location: [facility] INVASIVE CV LAB;  Service: Cardiovascular;  Laterality: N/A;   LEFT HEART CATHETERIZATION WITH CORONARY ANGIOGRAM N/A 11/23/2011   Procedure: LEFT HEART CATHETERIZATION WITH CORONARY ANGIOGRAM;  Surgeon: Marykay Lex, MD;  Location: [facility] CATH LAB;  Service: Cardiovascular;  Laterality: N/A;   TEE WITHOUT CARDIOVERSION N/A 01/25/2021   Procedure: TRANSESOPHAGEAL ECHOCARDIOGRAM (TEE);  Surgeon: Corliss Skains, MD;  Location: [facility] OR;  Service: Open Heart Surgery;  Laterality: N/A;    Allergies  Allergies  Allergen Reactions   Morphine And Codeine Itching    Possible itching due to morphine 01/26/21   Cortisone    Other Other (See Comments)    Cats- eyes burn     EKGs/Labs/Other Studies Reviewed:   The following studies were reviewed today:   LHC 12/28/20   Prox LAD to Mid LAD lesion is 65% stenosed with 30% stenosed side branch in 1st Diag. (RFR 0.90-0.91)   1st Diag lesion is 100% stenosed. (In-stent Re-stenosis/occlusion)   Mid LAD lesion is 40% stenosed.   Mid LAD to Dist LAD lesion is 75% stenosed. (RFR 0.74)   Ost Cx to Mid Cx lesion is 55% stenosed with 20% stenosed side branch in 1st Mrg.   Mid Cx lesion is 90% stenosed.   Prox RCA lesion is 40% stenosed.   -------------------------------------------   The left ventricular systolic function is normal.  The left ventricular ejection fraction is 50-55% by visual estimate.   LV end diastolic pressure is  normal.   There is no aortic valve stenosis.   SUMMARY Severe 3 Vessel CAD:  100% ISR of Major D1 stent,  focal 65% prox LAD (@ D1) lesion - FFR 0.91 followed by 40% LAD @ SP1 then long 70% mid LAD (FFR 0.74 - SIGNIFICANT).  prox-mid LCx diffuse 55% with focal 90% just after AVG branch in LCx-Lateral OM2. Moderate Prox-mid RCA 35-40% @ bend  Poor LV Gram image, but appears to have preserved LVEF with normal LVEDP (2 D Echo ordered).      RECOMMENDATIONS With extensive LAD and LCx disease and occluded diagonal, best treatment option would likely be CVTS.  He has been on Plavix since his PCI. CVTS consult placed, 2D echo ordered, and Plavix discontinued. Anticipate outpatient CVTS clinic visit with staged CABG after Plavix washout -> otherwise would need to consider extensive PCI of  the LAD with focal PCI of LCx and attempted recanalization of occluded D1 stent Will need to continue aggressive risk factor modification/Guideline Directed Medical Management   Echo 12/28/20 1. Left ventricular ejection fraction, by estimation, is 60 to 65%. The  left ventricle has normal function. The left ventricle has no regional  wall motion abnormalities. Left ventricular diastolic parameters are  consistent with Grade I diastolic  dysfunction (impaired relaxation).   2. Right ventricular systolic function is normal. The right ventricular  size is normal.   3. The mitral valve is normal in structure. No evidence of mitral valve  regurgitation. No evidence of mitral stenosis.   4. The aortic valve is normal in structure. Aortic valve regurgitation is  not visualized. No aortic stenosis is present.   5. The inferior vena cava is normal in size with greater than 50%  respiratory variability, suggesting right atrial pressure of 3 mmHg.   VAS Pre CABG Carotid and Extremities 01/21/21 Right Carotid: Velocities in the right ICA are consistent with a 1-39%  stenosis.   Left Carotid: Velocities in the left  ICA are consistent with a 1-39%  stenosis.  Vertebrals: Bilateral vertebral arteries demonstrate antegrade flow.   Right ABI: Resting right ankle-brachial index is within normal range. No  evidence of significant right lower extremity arterial disease.  Left ABI: Resting left ankle-brachial index is within normal range. No  evidence of significant left lower extremity arterial disease.  Right Upper Extremity: Doppler waveforms decrease >50% with right radial  compression. Doppler waveforms remain within normal limits with right  ulnar compression.  Left Upper Extremity: Doppler waveform obliterate with left radial  compression. Doppler waveforms remain within normal limits with left ulnar  compression.   Echo 05/04/22: IMPRESSIONS     1. Left ventricular ejection fraction, by estimation, is 65 to 70%. The  left ventricle has normal function. The left ventricle has no regional  wall motion abnormalities. Left ventricular diastolic parameters are  consistent with Grade I diastolic  dysfunction (impaired relaxation).   2. Right ventricular systolic function is normal. The right ventricular  size is normal. Tricuspid regurgitation signal is inadequate for assessing  PA pressure.   3. The mitral valve is abnormal. Trivial mitral valve regurgitation.   4. The aortic valve was not well visualized. Aortic valve regurgitation  is not visualized. Aortic valve sclerosis/calcification is present,  without any evidence of aortic stenosis.   Comparison(s): Changes from prior study are noted. 12/28/2020: LVEF 60-65%.   Myoview 05/12/22: Study Highlights      The study is normal. The study is low risk.   No ST deviation was noted.   Left ventricular function is normal. End diastolic cavity size is normal.   Prior study available for comparison from 12/04/2013.  EKG:  EKG is  not ordered today.    Recent Labs: 05/30/2022: TSH 1.570 09/06/2022: ALT 37; BUN 14; Creatinine 1.02; Hemoglobin 14.0;  Platelet Count 134; Potassium 4.6; Sodium 139  Recent Lipid Panel    Component Value Date/Time   CHOL 143 05/30/2022 1106   TRIG 176 (H) 05/30/2022 1106   HDL 27 (L) 05/30/2022 1106   CHOLHDL 5.3 (H) 05/30/2022 1106   CHOLHDL 6.6 11/23/2011 0846   VLDL 29 11/23/2011 0846   LDLCALC 85 05/30/2022 1106    Home Medications   Current Meds  Medication Sig   acetaminophen (TYLENOL) 500 MG tablet Take 1,500-2,000 mg by mouth daily.   albuterol (VENTOLIN HFA) 108 (90 Base) MCG/ACT inhaler Inhale  2 puffs into the lungs every 4 (four) hours as needed. For breathing.   allopurinol (ZYLOPRIM) 100 MG tablet Take 1 tablet (100 mg total) by mouth daily.   AMBULATORY NON FORMULARY MEDICATION Medical alert device as covered by insurance for frequent falls and weakness.   amLODipine (NORVASC) 5 MG tablet Take 1 tablet (5 mg total) by mouth daily.   amoxicillin-clavulanate (AUGMENTIN) 875-125 MG tablet Take 1 tablet by mouth 2 (two) times daily.   aspirin 81 MG EC tablet Take 1 tablet (81 mg total) by mouth daily.   celecoxib (CELEBREX) 100 MG capsule Take 1 capsule (100 mg total) by mouth 2 (two) times daily.   Continuous Glucose Receiver (FREESTYLE LIBRE 3 READER) DEVI Use with freestyle libre 3 sensor   Continuous Glucose Sensor (FREESTYLE LIBRE 3 SENSOR) MISC    DULoxetine (CYMBALTA) 20 MG capsule Take 1 capsule (20 mg total) by mouth daily.   gabapentin (NEURONTIN) 300 MG capsule Take 2 capsules (600 mg total) by mouth 3 (three) times daily. For back and foot pain.   glucose blood (ONETOUCH VERIO) test strip Use to check blood glucose 3 times daily   HYDROcodone-acetaminophen (NORCO/VICODIN) 5-325 MG tablet Take 1-2 tablets by mouth in the morning AND 1 tablet daily at 12 noon AND 1 tablet at bedtime.   insulin glargine, 1 Unit Dial, (TOUJEO SOLOSTAR) 300 UNIT/ML Solostar Pen Inject 40 Units into the skin daily.   insulin lispro (HUMALOG KWIKPEN) 200 UNIT/ML KwikPen INJECT 14 UNITS UNDER THE SKIN  EVERY MORNING, 14 UNITS WITH LUNCH AND 14 UNITS WITH SUPPER, DAILY   Insulin Pen Needle 32G X 4 MM MISC Use daily with insulin as directed   Iron, Ferrous Sulfate, 325 (65 Fe) MG TABS Take 1 tablet (325 mg) by mouth daily.   Lidocaine (ZTLIDO) 1.8 % PTCH Apply 1 patch topically daily. (May wear up to 12hours.)   metFORMIN (GLUCOPHAGE-XR) 500 MG 24 hr tablet Take 2 tablets (1,000 mg total) by mouth 2 (two) times daily.   metoprolol tartrate (LOPRESSOR) 25 MG tablet Take 1 tablet (25 mg total) by mouth 2 (two) times daily.   pantoprazole (PROTONIX) 40 MG tablet Take 1 tablet (40 mg total) by mouth daily.   potassium chloride (KLOR-CON M) 10 MEQ tablet Take 1 tablet (10 mEq total) by mouth daily.   rosuvastatin (CRESTOR) 20 MG tablet Take 1 tablet (20 mg total) by mouth daily.   Tetrahydrozoline HCl (VISINE EXTRA OP) Place 1 drop into both eyes as needed (itching).   umeclidinium-vilanterol (ANORO ELLIPTA) 62.5-25 MCG/ACT AEPB Inhale 1 puff into the lungs daily.   zolpidem (AMBIEN) 10 MG tablet Take 1 tablet (10 mg total) by mouth at bedtime as needed for sleep     Review of Systems      All other systems reviewed and are otherwise negative except as noted above.  Physical Exam    VS:  BP (!) 130/58 (BP Location: Right Arm, Patient Position: Sitting, Cuff Size: Normal)   Pulse 75   Ht 5\' 5"  (1.651 m)   Wt 180 lb 12.8 oz (82 kg)   SpO2 96%   BMI 30.09 kg/m  , BMI Body mass index is 30.09 kg/m.  Wt Readings from Last 3 Encounters:  01/08/23 180 lb 12.8 oz (82 kg)  12/25/22 178 lb (80.7 kg)  12/13/22 182 lb 3.2 oz (82.6 kg)     GEN: Well nourished, well developed, in no acute distress. HEENT: normal. Neck: Supple, no JVD, carotid bruits, or  masses. Cardiac: RRR, no murmurs, rubs, or gallops. No clubbing, cyanosis, edema.  Radials/PT 2+ and equal bilaterally.  Respiratory:  Respirations regular and unlabored, clear to auscultation bilaterally. GI: Soft, nontender, nondistended. MS:  No deformity or atrophy. Skin: Warm and dry, no rash. Neuro:  Strength and sensation are intact. Psych: Normal affect.  Assessment & Plan    CAD status post PCI and ultimately CABG 12/2020 -no chest pain. Does have chronic DOE.  -Complains of a lot of fatigue.  -reviewed echo and myoview- no ischemia and normal cardiac function -continue current medication regimen  Hyperlipidemia -LDL 85.  -statin had been erroneously discontinued.  - now back on Crestor 20 mg daily.  - seeing PCP soon. Request that lipids be rechecked.   Essential hypertension -well controlled  today -Continue current therapy  Bilateral carotid artery stenosis -minor disease on dopplers in Feb  Fatigue - unclear etiology.  DM Type 2 -on insulin and metformin -symptoms of neuropathy - per PCP - A1c was 8% in April.  - has lost weight and feels his sugars have improved.     Disposition: Follow up 6 months.   Signed, Athen Riel [place], MD 01/08/2023, 9:25 AM [facility]

## 2023-01-08 ENCOUNTER — Ambulatory Visit: Payer: Medicare Other | Attending: Cardiology | Admitting: Cardiology

## 2023-01-08 ENCOUNTER — Encounter: Payer: Self-pay | Admitting: Cardiology

## 2023-01-08 VITALS — BP 130/58 | HR 75 | Ht 65.0 in | Wt 180.8 lb

## 2023-01-08 DIAGNOSIS — I25708 Atherosclerosis of coronary artery bypass graft(s), unspecified, with other forms of angina pectoris: Secondary | ICD-10-CM | POA: Diagnosis not present

## 2023-01-08 DIAGNOSIS — E1142 Type 2 diabetes mellitus with diabetic polyneuropathy: Secondary | ICD-10-CM | POA: Diagnosis not present

## 2023-01-08 DIAGNOSIS — E785 Hyperlipidemia, unspecified: Secondary | ICD-10-CM

## 2023-01-08 DIAGNOSIS — Z951 Presence of aortocoronary bypass graft: Secondary | ICD-10-CM | POA: Diagnosis not present

## 2023-01-08 DIAGNOSIS — Z794 Long term (current) use of insulin: Secondary | ICD-10-CM | POA: Insufficient documentation

## 2023-01-08 DIAGNOSIS — I10 Essential (primary) hypertension: Secondary | ICD-10-CM | POA: Diagnosis not present

## 2023-01-08 NOTE — Patient Instructions (Signed)
Medication Instructions:  Continue same medications *If you need a refill on your cardiac medications before your next appointment, please call your pharmacy*   Lab Work: None orderd   Testing/Procedures: None ordered   Follow-Up: At [facility], you and your health needs are our priority.  As part of our continuing mission to provide you with exceptional heart care, we have created designated Provider Care Teams.  These Care Teams include your primary Cardiologist (physician) and Advanced Practice Providers (APPs -  Physician Assistants and Nurse Practitioners) who all work together to provide you with the care you need, when you need it.  We recommend signing up for the patient portal called "MyChart".  Sign up information is provided on this After Visit Summary.  MyChart is used to connect with patients for Virtual Visits (Telemedicine).  Patients are able to view lab/test results, encounter notes, upcoming appointments, etc.  Non-urgent messages can be sent to your provider as well.   To learn more about what you can do with MyChart, go to [email or web address].    Your next appointment:  6 months    Provider:  Dr.[name]

## 2023-01-09 ENCOUNTER — Ambulatory Visit: Payer: Self-pay | Admitting: Licensed Clinical Social Worker

## 2023-01-10 NOTE — Patient Outreach (Signed)
Care Coordination   Follow Up Visit Note   01/09/2023 Name: Paul [name] MRN: [id number] DOB: 1950-08-16  Paul [name] is a 72 y.o. year old male who sees Early, Sung Amabile, NP for primary care. I spoke with  Paul [name] by phone today.  What matters to the patients health and wellness today?  Symptom Management    Goals Addressed             This Visit's Progress    LCSW-Plan of Care MH Symptoms   On track    Activities and task to complete in order to accomplish goals.   Keep all upcoming appointments discussed today Continue with compliance of taking medication prescribed by Doctor Implement healthy coping skills discussed to assist with management of symptoms         SDOH assessments and interventions completed:  No     Care Coordination Interventions:  Yes, provided  Interventions Today    Flowsheet Row Most Recent Value  Chronic Disease   Chronic disease during today's visit Diabetes, Hypertension (HTN), Chronic Kidney Disease/End Stage Renal Disease (ESRD), Other  [Depression and Anxiety]  General Interventions   General Interventions Discussed/Reviewed General Interventions Reviewed, [facility], Doctor Visits  [Pt received ppwk with Plastic Surgery appt is for 10/3. Has stable transportation to appt]  Doctor Visits Discussed/Reviewed Doctor Visits Reviewed, Specialist  Mental Health Interventions   Mental Health Discussed/Reviewed Mental Health Reviewed, Coping Strategies, Anxiety, Grief and Loss  [LCSW provided safe environment for patient to process feelings associated with health conditions. Encouragement and validation provided. Strategies discussed to assist pt with negative emotions associated with his relationship with adult children]  Nutrition Interventions   Nutrition Discussed/Reviewed Nutrition Reviewed  [Patient reports he has lost weight which has postively improved breathing]  Pharmacy Interventions   Pharmacy Dicussed/Reviewed Medication  Adherence, Pharmacy Topics Reviewed  Safety Interventions   Safety Discussed/Reviewed Safety Reviewed       Follow up plan: Follow up call scheduled for 2-4 weeks    Encounter Outcome:  Patient Visit Completed   Jenel Lucks, MSW, LCSW [facility] Care Management [facility]  [facility] [email or web address] Phone [phone] 5:52 PM

## 2023-01-10 NOTE — Patient Instructions (Signed)
Visit Information  Thank you for taking time to visit with me today. Please don't hesitate to contact me if I can be of assistance to you.   Following are the goals we discussed today:   Goals Addressed             This Visit's Progress    LCSW-Plan of Care MH Symptoms   On track    Activities and task to complete in order to accomplish goals.   Keep all upcoming appointments discussed today Continue with compliance of taking medication prescribed by Doctor Implement healthy coping skills discussed to assist with management of symptoms         Our next appointment is by telephone on 10/10 at 12:30 pm  Please call the care guide team at [phone] if you need to cancel or reschedule your appointment.   If you are experiencing a Mental Health or Behavioral Health Crisis or need someone to talk to, please call the Suicide and Crisis Lifeline: 988 call 911   The patient verbalized understanding of instructions, educational materials, and care plan provided today and DECLINED offer to receive copy of patient instructions, educational materials, and care plan.   Jenel Lucks, MSW, LCSW [facility] Care Management [place]  [facility] [email or web address] Phone [phone] 5:53 PM

## 2023-01-11 ENCOUNTER — Other Ambulatory Visit: Payer: Self-pay | Admitting: Nurse Practitioner

## 2023-01-11 ENCOUNTER — Other Ambulatory Visit (HOSPITAL_COMMUNITY): Payer: Self-pay

## 2023-01-11 ENCOUNTER — Other Ambulatory Visit: Payer: Self-pay

## 2023-01-11 ENCOUNTER — Telehealth: Payer: Self-pay | Admitting: Nurse Practitioner

## 2023-01-11 ENCOUNTER — Encounter: Payer: Self-pay | Admitting: Nurse Practitioner

## 2023-01-11 ENCOUNTER — Ambulatory Visit: Payer: Medicare Other | Admitting: Nurse Practitioner

## 2023-01-11 VITALS — BP 128/82 | HR 87 | Wt 182.4 lb

## 2023-01-11 DIAGNOSIS — G4700 Insomnia, unspecified: Secondary | ICD-10-CM | POA: Diagnosis not present

## 2023-01-11 DIAGNOSIS — E1142 Type 2 diabetes mellitus with diabetic polyneuropathy: Secondary | ICD-10-CM | POA: Diagnosis not present

## 2023-01-11 DIAGNOSIS — G4733 Obstructive sleep apnea (adult) (pediatric): Secondary | ICD-10-CM | POA: Diagnosis not present

## 2023-01-11 DIAGNOSIS — G894 Chronic pain syndrome: Secondary | ICD-10-CM

## 2023-01-11 DIAGNOSIS — E538 Deficiency of other specified B group vitamins: Secondary | ICD-10-CM | POA: Diagnosis not present

## 2023-01-11 DIAGNOSIS — J449 Chronic obstructive pulmonary disease, unspecified: Secondary | ICD-10-CM | POA: Diagnosis not present

## 2023-01-11 DIAGNOSIS — Z87891 Personal history of nicotine dependence: Secondary | ICD-10-CM

## 2023-01-11 DIAGNOSIS — F5104 Psychophysiologic insomnia: Secondary | ICD-10-CM | POA: Diagnosis not present

## 2023-01-11 DIAGNOSIS — E1122 Type 2 diabetes mellitus with diabetic chronic kidney disease: Secondary | ICD-10-CM | POA: Diagnosis not present

## 2023-01-11 DIAGNOSIS — I739 Peripheral vascular disease, unspecified: Secondary | ICD-10-CM | POA: Diagnosis not present

## 2023-01-11 DIAGNOSIS — L989 Disorder of the skin and subcutaneous tissue, unspecified: Secondary | ICD-10-CM

## 2023-01-11 DIAGNOSIS — M5030 Other cervical disc degeneration, unspecified cervical region: Secondary | ICD-10-CM | POA: Diagnosis not present

## 2023-01-11 DIAGNOSIS — E669 Obesity, unspecified: Secondary | ICD-10-CM | POA: Diagnosis not present

## 2023-01-11 DIAGNOSIS — N1831 Chronic kidney disease, stage 3a: Secondary | ICD-10-CM

## 2023-01-11 DIAGNOSIS — E785 Hyperlipidemia, unspecified: Secondary | ICD-10-CM | POA: Diagnosis not present

## 2023-01-11 DIAGNOSIS — E66811 Obesity, class 1: Secondary | ICD-10-CM

## 2023-01-11 DIAGNOSIS — F331 Major depressive disorder, recurrent, moderate: Secondary | ICD-10-CM

## 2023-01-11 DIAGNOSIS — E1169 Type 2 diabetes mellitus with other specified complication: Secondary | ICD-10-CM

## 2023-01-11 LAB — CBC WITH DIFFERENTIAL/PLATELET
Basophils Absolute: 0.1 10*3/uL (ref 0.0–0.2)
Basos: 1 %
EOS (ABSOLUTE): 0.3 10*3/uL (ref 0.0–0.4)
Eos: 5 %
Hematocrit: 44.6 % (ref 37.5–51.0)
Hemoglobin: 14.3 g/dL (ref 13.0–17.7)
Immature Grans (Abs): 0 10*3/uL (ref 0.0–0.1)
Immature Granulocytes: 0 %
Lymphocytes Absolute: 1.7 10*3/uL (ref 0.7–3.1)
Lymphs: 27 %
MCH: 31.4 pg (ref 26.6–33.0)
MCHC: 32.1 g/dL (ref 31.5–35.7)
MCV: 98 fL — ABNORMAL HIGH (ref 79–97)
Monocytes Absolute: 0.4 10*3/uL (ref 0.1–0.9)
Monocytes: 7 %
Neutrophils Absolute: 4 10*3/uL (ref 1.4–7.0)
Neutrophils: 60 %
Platelets: 139 10*3/uL — ABNORMAL LOW (ref 150–450)
RBC: 4.55 x10E6/uL (ref 4.14–5.80)
RDW: 12.7 % (ref 11.6–15.4)
WBC: 6.6 10*3/uL (ref 3.4–10.8)

## 2023-01-11 LAB — COMPREHENSIVE METABOLIC PANEL

## 2023-01-11 LAB — LIPID PANEL

## 2023-01-11 LAB — HEMOGLOBIN A1C

## 2023-01-11 MED ORDER — ZOLPIDEM TARTRATE 10 MG PO TABS
10.0000 mg | ORAL_TABLET | Freq: Every evening | ORAL | 2 refills | Status: DC | PRN
Start: 2023-01-11 — End: 2023-04-09
  Filled 2023-01-11 – 2023-01-12 (×5): qty 30, 30d supply, fill #0
  Filled 2023-02-09: qty 30, 30d supply, fill #1
  Filled 2023-03-09: qty 30, 30d supply, fill #2

## 2023-01-11 MED ORDER — ZOLPIDEM TARTRATE 10 MG PO TABS
10.0000 mg | ORAL_TABLET | Freq: Every evening | ORAL | 2 refills | Status: DC | PRN
Start: 2023-01-11 — End: 2023-01-11

## 2023-01-11 MED ORDER — CYANOCOBALAMIN 1000 MCG/ML IJ SOLN
1000.0000 ug | Freq: Once | INTRAMUSCULAR | Status: AC
Start: 2023-01-11 — End: 2023-01-11
  Administered 2023-01-11: 1000 ug via INTRAMUSCULAR

## 2023-01-11 NOTE — Progress Notes (Signed)
[name], DNP, AGNP-c [facility]  [address] [place], [place] [address] [phone]  ESTABLISHED PATIENT- Chronic Health and/or Follow-Up Visit  Blood pressure 128/82, pulse 87, weight 182 lb 6.4 oz (82.7 kg).   Lung Cancer Screening Urine Micro Colon Cancer Screening Eye Exam Foot Exam  Paul [name] is a 72 y.o. year old male presenting today for evaluation and management of chronic conditions.   Paul [name] reports a persistent issue with a painful growth on the hand, which has been present for approximately three to four months. The growth, described as hard and similar to a fingernail on top, has been increasing in size and is associated with intermittent sharp pain radiating down the arm. The patient has attempted self-treatment by cutting into the growth, which only resulted in bleeding and no relief of symptoms. He has an appointment scheduled with dermatology for removal in the near future.   He has a history of a herniated disc and underwent neck surgery approximately 15 years ago. He reports chronic pain, which is not well-controlled despite current medication regimen. He also reports difficulty sleeping, often running out of sleeping medication before the next refill is due. He does not feel that someone would be taking his medication but admits that the medication is kept on a hutch in the house that guests would have access to. He plans on moving the pain medication and sleep medication to a private location.   Paul [name] has a history of diabetes, with a recent blood glucose reading of 242 mg/dL post-breakfast. He reports following a low carb diet most of the time. He is unable to get much exercise due to pain and limitations with his back. He is not having any symptoms of hyperglycemia or hypoglycemia.   He also reports respiratory issues, with increased use of an inhaler in recent weeks due to perceived difficulty in breathing. He reports relief with inhaler  use. He is not waking feeling short of breath.   Paul [name] also discusses his mood disturbances, which he is trying to manage independently. He recently tried a new antidepressant medication, but discontinued it due to adverse effects, including nausea and dry heaving. He would like to try to manage this without medication, if possible. We discussed the support from his ex son-in-in-law and his girlfriend that he has. His primary concern is his children's indifference to a relationship with him for no known reason.     All ROS negative with exception of what is listed above.   PHYSICAL EXAM Physical Exam Vitals and nursing note reviewed.  Constitutional:      Appearance: Normal appearance.  HENT:     Head: Normocephalic.  Eyes:     Pupils: Pupils are equal, round, and reactive to light.  Neck:     Vascular: No carotid bruit.  Cardiovascular:     Rate and Rhythm: Normal rate and regular rhythm.     Pulses: Normal pulses.     Heart sounds: Normal heart sounds.  Pulmonary:     Effort: Pulmonary effort is normal.     Breath sounds: Normal breath sounds.  Musculoskeletal:        General: Normal range of motion.     Cervical back: Normal range of motion.  Skin:    General: Skin is warm.     Capillary Refill: Capillary refill takes less than 2 seconds.  Neurological:     General: No focal deficit present.     Mental Status: He is alert and oriented to person,  place, and time.  Psychiatric:        Mood and Affect: Mood normal.        Behavior: Behavior normal.     PLAN Problem List Items Addressed This Visit     Obesity (BMI 30.0-34.9) (Chronic)    Stable. Limitations to exercise due to pain. Diet control recommended.       Relevant Orders   Microalbumin / creatinine urine ratio   Hemoglobin A1c (Completed)   CBC with Differential/Platelet (Completed)   Comprehensive metabolic panel (Completed)   Lipid panel (Completed)   Hyperlipidemia associated with type 2 diabetes  mellitus (HCC) - Primary    Labs pending      Relevant Orders   Microalbumin / creatinine urine ratio   Hemoglobin A1c (Completed)   CBC with Differential/Platelet (Completed)   Comprehensive metabolic panel (Completed)   Lipid panel (Completed)   Former smoker    Consider lung CT      OSA and COPD overlap syndrome (HCC)    Chronic condition. Currently experiencing some flairing due to increased allergens from season change. Monitor closely. Lungs sound clear.       Relevant Orders   Microalbumin / creatinine urine ratio   Hemoglobin A1c (Completed)   CBC with Differential/Platelet (Completed)   Comprehensive metabolic panel (Completed)   Lipid panel (Completed)   Diabetes mellitus with stage 3 chronic kidney disease, with long-term current use of insulin (HCC)    Blood glucose reading of 242 during visit, patient is on insulin. Discussion of diet and control.  -Check labs to assess current glycemic control.      Chronic kidney disease, stage 3a (HCC)    Recommend improved blood sugar control. BP is good.       Relevant Orders   Microalbumin / creatinine urine ratio   Hemoglobin A1c (Completed)   CBC with Differential/Platelet (Completed)   Comprehensive metabolic panel (Completed)   Lipid panel (Completed)   DDD (degenerative disc disease), cervical    Persistent pain in arm and shoulder, possibly related to previous cervical spine surgery. Current regimen of gabapentin and pain medication providing partial relief. -Increase gabapentin to 600mg  TID for one week to assess for improved pain control. -Call office in one week to report if increased gabapentin dose is effective. -If medication effective, will plan to send increased dose to pharmacy.  -Consider taper off of hydrocodone if not effective.       Peripheral vascular disease (HCC)    No signs of edema. Coloration and pulses WNL. Monitor.       Relevant Orders   Microalbumin / creatinine urine ratio    Hemoglobin A1c (Completed)   CBC with Differential/Platelet (Completed)   Comprehensive metabolic panel (Completed)   Lipid panel (Completed)   Major depressive disorder    Patient reports ongoing mood issues, recent trial of antidepressant medication was not tolerated. Discussion of coping strategies and reliance on the "non-biological family" that he has supporting him.  -Consider alternative antidepressant options if mood does not improve with increased gabapentin dose.      Diabetic polyneuropathy associated with type 2 diabetes mellitus (HCC)    Pulses intact. Foot exam normal. No falls. Gabapentin increased for back pain.       Relevant Orders   Microalbumin / creatinine urine ratio   Hemoglobin A1c (Completed)   CBC with Differential/Platelet (Completed)   Comprehensive metabolic panel (Completed)   Lipid panel (Completed)   Chronic pain syndrome    Persistent pain in arm  and shoulder, possibly related to previous cervical spine surgery. Current regimen of gabapentin and pain medication providing partial relief. -Increase gabapentin to 600mg  TID for one week to assess for improved pain control. -Call office in one week to report if increased gabapentin dose is effective. -If medication effective, will plan to send increased dose to pharmacy.  -Consider taper off of hydrocodone if not effective.       Persistent insomnia    Difficulty maintaining sleep, currently managed with sleeping pills. Strongly encourage moving medication to a new location if doses are missing. We can alternatively consider pre-packaged daily medication packs from the pharmacy.  -Continue current regimen, consider alternative options if sleep does not improve with increased gabapentin dose.      B12 deficiency    monitor      Skin lesion of hand    Persistent, hard, tender lesion on hand, not improving with antibiotics. Scheduled for surgical evaluation and treatment. No alarm signs present at this  time.  -Continue with planned surgical consultation.      Other Visit Diagnoses     Psychophysiological insomnia           Return in about 4 months (around 05/13/2023) for Med Management 30.    [name], DNP, AGNP-c

## 2023-01-11 NOTE — Patient Instructions (Addendum)
Gabapentin: Take 3 in the AM, 2 in the afternoon, 3 at bedtime.  Call me in 1 week to let me know if the dose is working.

## 2023-01-11 NOTE — Telephone Encounter (Signed)
Spoke to [place] at [facility] and cancelled Zolpidem 10 mg rx sent to them today  Rx should only go to [facility] can you cancel on chart to reflect verbal cancel at pharmacy

## 2023-01-12 ENCOUNTER — Encounter: Payer: Self-pay | Admitting: Nurse Practitioner

## 2023-01-12 ENCOUNTER — Other Ambulatory Visit (HOSPITAL_COMMUNITY): Payer: Self-pay

## 2023-01-12 DIAGNOSIS — D0460 Carcinoma in situ of skin of unspecified upper limb, including shoulder: Secondary | ICD-10-CM | POA: Insufficient documentation

## 2023-01-12 DIAGNOSIS — L989 Disorder of the skin and subcutaneous tissue, unspecified: Secondary | ICD-10-CM | POA: Insufficient documentation

## 2023-01-12 LAB — MICROALBUMIN / CREATININE URINE RATIO
Creatinine, Urine: 163.7 mg/dL
Microalb/Creat Ratio: 17 mg/g{creat} (ref 0–29)
Microalbumin, Urine: 27.9 ug/mL

## 2023-01-12 NOTE — Assessment & Plan Note (Signed)
monitor

## 2023-01-12 NOTE — Assessment & Plan Note (Signed)
Pulses intact. Foot exam normal. No falls. Gabapentin increased for back pain.

## 2023-01-12 NOTE — Assessment & Plan Note (Signed)
Stable. Limitations to exercise due to pain. Diet control recommended.

## 2023-01-12 NOTE — Assessment & Plan Note (Signed)
No signs of edema. Coloration and pulses WNL. Monitor.

## 2023-01-12 NOTE — Assessment & Plan Note (Signed)
Persistent, hard, tender lesion on hand, not improving with antibiotics. Scheduled for surgical evaluation and treatment. No alarm signs present at this time.  -Continue with planned surgical consultation.

## 2023-01-12 NOTE — Assessment & Plan Note (Addendum)
Difficulty maintaining sleep, currently managed with sleeping pills. Strongly encourage moving medication to a new location if doses are missing. We can alternatively consider pre-packaged daily medication packs from the pharmacy.  -Continue current regimen, consider alternative options if sleep does not improve with increased gabapentin dose.

## 2023-01-12 NOTE — Assessment & Plan Note (Addendum)
Persistent pain in arm and shoulder, possibly related to previous cervical spine surgery. Current regimen of gabapentin and pain medication providing partial relief. -Increase gabapentin to 600mg  TID for one week to assess for improved pain control. -Call office in one week to report if increased gabapentin dose is effective. -If medication effective, will plan to send increased dose to pharmacy.  -Consider taper off of hydrocodone if not effective.

## 2023-01-12 NOTE — Assessment & Plan Note (Signed)
Patient reports ongoing mood issues, recent trial of antidepressant medication was not tolerated. Discussion of coping strategies and reliance on the "non-biological family" that he has supporting him.  -Consider alternative antidepressant options if mood does not improve with increased gabapentin dose.

## 2023-01-12 NOTE — Assessment & Plan Note (Signed)
Consider lung CT

## 2023-01-12 NOTE — Assessment & Plan Note (Signed)
Chronic condition. Currently experiencing some flairing due to increased allergens from season change. Monitor closely. Lungs sound clear.

## 2023-01-12 NOTE — Assessment & Plan Note (Signed)
Recommend improved blood sugar control. BP is good.

## 2023-01-12 NOTE — Assessment & Plan Note (Signed)
Blood glucose reading of 242 during visit, patient is on insulin. Discussion of diet and control.  -Check labs to assess current glycemic control.

## 2023-01-12 NOTE — Assessment & Plan Note (Signed)
Persistent pain in arm and shoulder, possibly related to previous cervical spine surgery. Current regimen of gabapentin and pain medication providing partial relief. -Increase gabapentin to 600mg  TID for one week to assess for improved pain control. -Call office in one week to report if increased gabapentin dose is effective. -If medication effective, will plan to send increased dose to pharmacy.  -Consider taper off of hydrocodone if not effective.

## 2023-01-12 NOTE — Assessment & Plan Note (Signed)
Labs pending.  

## 2023-01-18 ENCOUNTER — Telehealth: Payer: Self-pay | Admitting: *Deleted

## 2023-01-18 ENCOUNTER — Encounter: Payer: Self-pay | Admitting: Plastic Surgery

## 2023-01-18 ENCOUNTER — Ambulatory Visit: Payer: Medicare Other | Admitting: Plastic Surgery

## 2023-01-18 VITALS — BP 159/78 | HR 77 | Ht 65.0 in | Wt 182.4 lb

## 2023-01-18 DIAGNOSIS — D485 Neoplasm of uncertain behavior of skin: Secondary | ICD-10-CM | POA: Diagnosis not present

## 2023-01-18 DIAGNOSIS — D489 Neoplasm of uncertain behavior, unspecified: Secondary | ICD-10-CM

## 2023-01-18 NOTE — Telephone Encounter (Signed)
[facility] Pre-operative Risk Assessment    Patient Name: Paul [name]  DOB: 04-06-1951 MRN: [id number]  HEARTCARE STAFF:  - IMPORTANT!!!!!! Under Visit Info/Reason for Call, type in Other and utilize the format Clearance MM/DD/YY or Clearance TBD. Do not use dashes or single digits. - Please review there is not already an duplicate clearance open for this procedure. - If request is for dental extraction, please clarify the # of teeth to be extracted. - If the patient is currently at the dentist's office, call Pre-Op Callback Staff (MA/nurse) to input urgent request.  - If the patient is not currently in the dentist office, please route to the Pre-Op pool.  Request for surgical clearance:  What type of surgery is being performed? Excision of skin cancer with possible skin graft  When is this surgery scheduled? ASAP  What type of clearance is required (medical clearance vs. Pharmacy clearance to hold med vs. Both)? Both  Are there any medications that need to be held prior to surgery and how long? Aspirin 81 mg  Practice name and name of physician performing surgery? [facility], Dr Ladona Ridgel  What is the office phone number? [phone]   7.   What is the office fax number? [phone]  8.   Anesthesia type (None, local, MAC, general) ? Not listed   Valrie Hart 01/18/2023, 5:13 PM  _________________________________________________________________   (provider comments below)

## 2023-01-18 NOTE — Progress Notes (Signed)
Referring Provider Paul, Sung Amabile, NP [address] [place],  [place] [address]   CC:  Chief Complaint  Patient presents with   Advice Only      Paul [name] is an 72 y.o. male.  HPI: [name] is a 72 year old male who presents today for evaluation management of a 2 cm raised skin lesion on the dorsum of the left hand overlying the first metacarpal.  Patient states that the lesion grew over the past 3 months.  Prior to the lesion being there he had a trauma where he hit the area with a drill while he was doing woodworking.  He states he was placed on antibiotics at the time of the injury.  He also states that he has tried to file the lesion off and to cut it off with a knife.  He currently has pain on the periphery of the wound but none in the center of the wound. Patient has multiple medical problems including coronary artery disease for which he underwent a coronary artery bypass grafting 2 years ago and COPD which she is currently treated for.  Allergies  Allergen Reactions   Morphine And Codeine Itching    Possible itching due to morphine 01/26/21   Cortisone    Other Other (See Comments)    Cats- eyes burn    Outpatient Encounter Medications as of 01/18/2023  Medication Sig Note   acetaminophen (TYLENOL) 500 MG tablet Take 1,500-2,000 mg by mouth daily. 12/25/2022: As needed   albuterol (VENTOLIN HFA) 108 (90 Base) MCG/ACT inhaler Inhale 2 puffs into the lungs every 4 (four) hours as needed. For breathing. 12/25/2022: Prn, used yesterday   allopurinol (ZYLOPRIM) 100 MG tablet Take 1 tablet (100 mg total) by mouth daily.    AMBULATORY NON FORMULARY MEDICATION Medical alert device as covered by insurance for frequent falls and weakness.    amLODipine (NORVASC) 5 MG tablet Take 1 tablet (5 mg total) by mouth daily.    aspirin 81 MG EC tablet Take 1 tablet (81 mg total) by mouth daily.    celecoxib (CELEBREX) 100 MG capsule Take 1 capsule (100 mg total) by mouth 2 (two) times  daily.    Continuous Glucose Receiver (FREESTYLE LIBRE 3 READER) DEVI Use with freestyle libre 3 sensor    Continuous Glucose Sensor (FREESTYLE LIBRE 3 SENSOR) MISC     gabapentin (NEURONTIN) 300 MG capsule Take 2 capsules (600 mg total) by mouth 3 (three) times daily. For back and foot pain.    glucose blood (ONETOUCH VERIO) test strip Use to check blood glucose 3 times daily    [START ON 01/29/2023] HYDROcodone-acetaminophen (NORCO/VICODIN) 5-325 MG tablet Take 1-2 tablets by mouth in the morning AND 1 tablet daily at 12 noon AND 1 tablet at bedtime.    [START ON 02/28/2023] HYDROcodone-acetaminophen (NORCO/VICODIN) 5-325 MG tablet Take 1-2 tablets by mouth in the morning AND 1 tablet daily at 12 noon AND 1 tablet at bedtime.    HYDROcodone-acetaminophen (NORCO/VICODIN) 5-325 MG tablet Take 1-2 tablets by mouth in the morning AND 1 tablet daily at 12 noon AND 1 tablet at bedtime.    insulin glargine, 1 Unit Dial, (TOUJEO SOLOSTAR) 300 UNIT/ML Solostar Pen Inject 40 Units into the skin daily.    insulin lispro (HUMALOG KWIKPEN) 200 UNIT/ML KwikPen INJECT 14 UNITS UNDER THE SKIN EVERY MORNING, 14 UNITS WITH LUNCH AND 14 UNITS WITH SUPPER, DAILY    Insulin Pen Needle 32G X 4 MM MISC Use daily with insulin as  directed    Iron, Ferrous Sulfate, 325 (65 Fe) MG TABS Take 1 tablet (325 mg) by mouth daily.    Lidocaine (ZTLIDO) 1.8 % PTCH Apply 1 patch topically daily. (May wear up to 12hours.)    metFORMIN (GLUCOPHAGE-XR) 500 MG 24 hr tablet Take 2 tablets (1,000 mg total) by mouth 2 (two) times daily.    metoprolol tartrate (LOPRESSOR) 25 MG tablet Take 1 tablet (25 mg total) by mouth 2 (two) times daily.    pantoprazole (PROTONIX) 40 MG tablet Take 1 tablet (40 mg total) by mouth daily.    potassium chloride (KLOR-CON M) 10 MEQ tablet Take 1 tablet (10 mEq total) by mouth daily.    rosuvastatin (CRESTOR) 20 MG tablet Take 1 tablet (20 mg total) by mouth daily.    Tetrahydrozoline HCl (VISINE EXTRA OP)  Place 1 drop into both eyes as needed (itching).    umeclidinium-vilanterol (ANORO ELLIPTA) 62.5-25 MCG/ACT AEPB Inhale 1 puff into the lungs daily.    zolpidem (AMBIEN) 10 MG tablet Take 1 tablet (10 mg total) by mouth at bedtime as needed for sleep    No facility-administered encounter medications on file as of 01/18/2023.     Past Medical History:  Diagnosis Date   Angina, class III (HCC)    Anxiety    Arthritis    Bilateral carotid artery stenosis    BPH without obstruction/lower urinary tract symptoms    CAD 11/24/2011   a) Mild-to-moderate 30-40% lesions in the RCA, LAD and Circumflex. b) CULPRIT LESION: long tubular 70-80% lesion in D1 with FFR of 0.7 --> PCI w/ Xience Xpedition DES 2.5 mm x 30 mm (2.65 MM); c) Lexiscan Myoview 11/2013: No Ischemia or Infarct (Inferior Gut Attenuation) EF 63%.   Chronic fatigue syndrome 06/13/2021   Chronic heart failure (HCC)    Chronic kidney disease (CKD), stage III (moderate) (HCC)    Chronic pain syndrome    Cigarette smoker 02/22/2014   Cirrhosis (HCC)    Constipation by delayed colonic transit 06/13/2021   COPD (chronic obstructive pulmonary disease) (HCC)    "don't have full case of it; I'm right there at it"   Depression    Diabetes mellitus without complication (HCC)    Diverticulosis    Dysphagia    Elevated alkaline phosphatase level 08/17/2022   Elevated levels of transaminase & lactic acid dehydrogenase 06/09/2016   Essential hypertension 11/24/2011   Exertional dyspnea, chronic    Fatigue 12/08/2021   Former smoker    GERD (gastroesophageal reflux disease)    Gout    H/O: GI bleed    Hearing loss    Hyperglycemia 03/11/2019   Hyperlipidemia with target LDL less than 70 11/24/2011   Hypo-osmolality and hyponatremia 03/11/2019   Iron deficiency anemia    Lipoprotein deficiency disorder    Long term (current) use of insulin (HCC) 07/15/2019   Obesity (BMI 30.0-34.9)    OSA (obstructive sleep apnea), uses oxygen at home  did not tolerate cpap 11/24/2011   Pain due to onychomycosis of toenails of both feet 08/23/2022   Pain in both lower extremities 02/14/2022   Paresthesia of skin 02/17/2015   Peripheral vascular disease (HCC)    Portal hypertension (HCC)    Right hip pain 06/13/2021   S/P CABG (coronary artery bypass graft)    Shoulder joint pain 02/17/2015   Spinal stenosis    Splenomegaly    Thrombocytopenia (HCC)    Vitamin D deficiency     Past Surgical History:  Procedure  Laterality Date   CERVICAL SPINE SURGERY  2012   CORONARY ANGIOPLASTY WITH STENT PLACEMENT  11/23/2011   "1; first one"   CORONARY ARTERY BYPASS GRAFT N/A 01/25/2021   Procedure: CORONARY ARTERY BYPASS GRAFTING (CABG) X2, USING LEFT INTERNAL MAMMARY ARTERY AND LEFT LEG GREATER SAPHENOUS VEIN HARVESTED ENDOSCOPICALLY;  Surgeon: Corliss Skains, MD;  Location: [facility] OR;  Service: Open Heart Surgery;  Laterality: N/A;   ENDOVEIN HARVEST OF GREATER SAPHENOUS VEIN Bilateral 01/25/2021   Procedure: ENDOVEIN HARVEST OF GREATER SAPHENOUS VEIN;  Surgeon: Corliss Skains, MD;  Location: [facility] OR;  Service: Open Heart Surgery;  Laterality: Bilateral;   LEFT HEART CATH AND CORONARY ANGIOGRAPHY N/A 12/28/2020   Procedure: LEFT HEART CATH AND CORONARY ANGIOGRAPHY;  Surgeon: Marykay Lex, MD;  [facility] INVAS[facility] CV LAB;  Service: Cardiovascular;  Laterality: N/A;   LEFT HEART CATHETERIZATION WITH CORONARY ANGIOGRAM N/A 11/23/2011   Procedure: LEFT HEART CATHETERIZATION WITH CORONARY ANGIOGRAM;  Surgeon: Marykay Lex, MD;  Location: [facility] CATH LAB;  Service: Cardiovascular;  Laterality: N/A;   TEE WITHOUT CARDIOVERSION N/A 01/25/2021   Procedure: TRANSESOPHAGEAL ECHOCARDIOGRAM (TEE);  Surgeon: Corliss Skains, MD;  Location: [facility] OR;  Service: Open Heart Surgery;  Laterality: N/A;    Family History  Problem Relation Age of Onset   Heart disease Sister    Heart attack Brother    Emphysema Father        smoked   Aneurysm Father      Social History   Social History Narrative   He is a 72 y.o. divorced father of 3, grandfather 1.   He is a retired Equities trader, former Nutritional therapist. 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   He quit smoking in August 2013, after smoking a pack a day for roughly 40 years.   He drinks socially 2-3 drinks a week only.   He does not get routine exercise, mostly due to 2 fatigue and dyspnea. Otherwise been relatively sedentary.     Review of Systems General: Denies fevers, chills, weight loss CV: Denies chest pain, shortness of breath, palpitations Hand: 2 cm raised lesion on the left hand.  Patient endorses pain at the periphery of the lesion.  No drainage or erythema.  Physical Exam    01/18/2023    2:41 PM 01/11/2023   11:27 AM 01/08/2023    8:38 AM  Vitals with BMI  Height 5\' 5"   5\' 5"   Weight 182 lbs 6 oz 182 lbs 6 oz 180 lbs 13 oz  BMI 30.35 30.35 30.09  Systolic 159 128 147  Diastolic 78 82 58  Pulse 77 87 75    General:  No acute distress,  Alert and oriented, Non-Toxic, Normal speech and affect Hand: As noted above patient has a 2 cm raised lesion with an ulcerated center.  There is no drainage from the wound at this time.   Assessment/Plan Skin lesion, left hand: Is unclear what this lesion is though the appearance is that of a carcinoma.  It may be a result of the trauma that he had to the area.  Regardless the lesion should be excised.  I discussed the procedure with the patient including the possibility that I may not be able to close it primarily and he will need a skin graft at the site of the excision.  He may require additional procedures if I am unable to clear the margins of  the wound with the excision.  Mr. Strike states that he is on multiple medical treatments including an anticoagulant.  I am unable to find any of these medications in his current record.  I have asked him to  bring all of his medications to the office next Wednesday so that we can review them.  In the meantime we will work on cardiac and pulmonary clearance from his cardiologist and family medicine provider respectively.  Photographs were obtained today with his consent.  All questions were answered to his satisfaction.  Will submit him for excision of the skin lesion on his left hand with possible skin graft or possible local tissue rearrangement.  Santiago Glad 01/18/2023, 3:20 PM

## 2023-01-19 NOTE — Telephone Encounter (Signed)
Name: Paul [name]  DOB: [birth year]  MRN: [id number]   Primary Cardiologist: Peter Swaziland, MD  Chart reviewed as part of pre-operative protocol coverage. Patient was contacted 01/19/2023 in reference to pre-operative risk assessment for pending surgery as outlined below.  Paul [name] was last seen on 01/08/2023 by dr. Swaziland.  Since that day, Paul [name] has done well.  Per office protocol, if patient is without any new symptoms or concerns at the time of their virtual visit, he/she may hold ASA for 7 days prior to procedure. Please resume ASA as soon as possible postprocedure, at the discretion of the surgeon.   00.   Therefore, based on ACC/AHA guidelines, the patient would be at acceptable risk for the planned procedure without further cardiovascular testing.   The patient was advised that if he develops new symptoms prior to surgery to contact our office to arrange for a follow-up visit, and he verbalized understanding.  I will route this recommendation to the requesting party via Epic fax function and remove from pre-op pool. Please call with questions.  Joni Reining, NP 01/19/2023, 7:40 AM

## 2023-01-22 ENCOUNTER — Other Ambulatory Visit: Payer: Self-pay | Admitting: Nurse Practitioner

## 2023-01-22 DIAGNOSIS — E1142 Type 2 diabetes mellitus with diabetic polyneuropathy: Secondary | ICD-10-CM

## 2023-01-22 DIAGNOSIS — G894 Chronic pain syndrome: Secondary | ICD-10-CM

## 2023-01-22 NOTE — Telephone Encounter (Signed)
Please call  Pt called he has increased Gabapentin  he is taking 2 am        2 mid day and        2 pm  And that is not helping

## 2023-01-24 ENCOUNTER — Ambulatory Visit (INDEPENDENT_AMBULATORY_CARE_PROVIDER_SITE_OTHER): Payer: Medicare Other | Admitting: Plastic Surgery

## 2023-01-24 ENCOUNTER — Encounter: Payer: Self-pay | Admitting: Plastic Surgery

## 2023-01-24 VITALS — BP 129/74 | HR 75 | Ht 65.0 in | Wt 182.0 lb

## 2023-01-24 DIAGNOSIS — D489 Neoplasm of uncertain behavior, unspecified: Secondary | ICD-10-CM

## 2023-01-24 DIAGNOSIS — D485 Neoplasm of uncertain behavior of skin: Secondary | ICD-10-CM | POA: Diagnosis not present

## 2023-01-24 NOTE — Progress Notes (Signed)
Paul [name] returns today for medication reconciliation.  On evaluation of his medications there were no anticoagulants other than his aspirin.  He will hold his aspirin 5 days prior to the procedure.  He has no questions about the upcoming skin lesion excision and wound closure.  I have asked that this be scheduled as a priority procedure.

## 2023-01-25 ENCOUNTER — Other Ambulatory Visit: Payer: Self-pay

## 2023-01-25 ENCOUNTER — Other Ambulatory Visit (HOSPITAL_COMMUNITY): Payer: Self-pay

## 2023-01-25 ENCOUNTER — Ambulatory Visit: Payer: Self-pay | Admitting: Licensed Clinical Social Worker

## 2023-01-25 MED ORDER — DULOXETINE HCL 20 MG PO CPEP
ORAL_CAPSULE | ORAL | 0 refills | Status: DC
Start: 2023-01-25 — End: 2023-03-19
  Filled 2023-01-25: qty 60, 30d supply, fill #0

## 2023-01-25 NOTE — Telephone Encounter (Signed)
Please call [place]. He can increase his nighttime dose to 3 capsules (900mg ). I am concerned that increasing his daytime dosing that much may make him too sleepy.   We also can add a medication called Cymbalta, which can help with chronic pain. This is a once a day medication. I have pended the medication for him to start if interested, OK to send to preferred pharmacy.  We will follow up in 3 months to see if this is helping. It will take time to get into his system and work.

## 2023-01-26 NOTE — Patient Outreach (Signed)
Care Coordination   Follow Up Visit Note   01/25/2023 Name: Cope Benzon MRN: [id number] DOB: 06-Jul-1950  Paul [name] is a 72 y.o. year old male who sees Early, Sung Amabile, NP for primary care. I spoke with  [name] by phone today.  What matters to the patients health and wellness today?  Symptom Management    Goals Addressed             This Visit's Progress    LCSW-Plan of Care MH Symptoms   On track    Activities and task to complete in order to accomplish goals.   Keep all upcoming appointments discussed today Continue with compliance of taking medication prescribed by Doctor Implement healthy coping skills discussed to assist with management of symptoms         SDOH assessments and interventions completed:  No     Care Coordination Interventions:  Yes, provided  Interventions Today    Flowsheet Row Most Recent Value  Chronic Disease   Chronic disease during today's visit Diabetes, Hypertension (HTN), Chronic Kidney Disease/End Stage Renal Disease (ESRD), Other  [Depression and Anxiety]  General Interventions   General Interventions Discussed/Reviewed General Interventions Reviewed, Doctor Visits  [Plastic Surgeon agreed to do surgery, waiting for scheduling.]  Doctor Visits Discussed/Reviewed Doctor Visits Reviewed, Specialist  Mental Health Interventions   Mental Health Discussed/Reviewed Mental Health Reviewed, Coping Strategies, Anxiety, Depression  Nutrition Interventions   Nutrition Discussed/Reviewed Nutrition Reviewed  Pharmacy Interventions   Pharmacy Dicussed/Reviewed Pharmacy Topics Reviewed, Medication Adherence  [Patient reports gabapentin was recently adjusted, to include another dosage in afternoon. Pt has meds,  however, reports no difference in pain management at this time. He agreed to f/up with PCP]  Safety Interventions   Safety Discussed/Reviewed Safety Reviewed       Follow up plan: Follow up call scheduled for 2-4 weeks    Encounter  Outcome:  Patient Visit Completed   Jenel Lucks, MSW, LCSW [facility] Care Management [facility]  [facility] [email or web address] Phone [phone] 6:10 AM

## 2023-01-26 NOTE — Patient Instructions (Signed)
Visit Information  Thank you for taking time to visit with me today. Please don't hesitate to contact me if I can be of assistance to you.   Following are the goals we discussed today:   Goals Addressed             This Visit's Progress    LCSW-Plan of Care MH Symptoms   On track    Activities and task to complete in order to accomplish goals.   Keep all upcoming appointments discussed today Continue with compliance of taking medication prescribed by Doctor Implement healthy coping skills discussed to assist with management of symptoms         Our next appointment is by telephone on 10/31 at 12:30 PM  Please call the care guide team at [phone] if you need to cancel or reschedule your appointment.   If you are experiencing a Mental Health or Behavioral Health Crisis or need someone to talk to, please call the Suicide and Crisis Lifeline: 988 call 911   The patient verbalized understanding of instructions, educational materials, and care plan provided today and DECLINED offer to receive copy of patient instructions, educational materials, and care plan.   Jenel Lucks, MSW, LCSW [facility] Care Management [facility]  [facility] [email or web address] Phone [phone] 6:12 AM

## 2023-01-29 ENCOUNTER — Other Ambulatory Visit (HOSPITAL_COMMUNITY): Payer: Self-pay

## 2023-02-02 ENCOUNTER — Other Ambulatory Visit: Payer: Self-pay | Admitting: *Deleted

## 2023-02-02 NOTE — Patient Outreach (Signed)
Care Management   Visit Note  02/02/2023 Name: Paul [name] MRN: [id number] DOB: 09/05/1950  Subjective: Paul [name] is a 72 y.o. year old male who is a primary care patient of Early, Sung Amabile, NP. The Care Management team was consulted for assistance.      Engaged with patient spoke with patient by telephone for follow up   Goals Addressed             This Visit's Progress    COMPLETED: CCM (COPD) EXPECTED OUTCOME: MONITOR, SELF-MANAGE AND REDUCE SYMTOM OF COPD       Current Barriers:  Knowledge Deficits related to COPD management Chronic Disease Management support and education needs related to COPD, action plan, falls Financial Constraints. - Child psychotherapist continues working with pt Patient reports he has all medications and taking as prescribed Patient has shower seat, walker, cane, has relatives he can call on if needed Patient has had several falls this past year due to having balance issues at times, no falls since last conversation Patient recently prescribed new medication for depression and pt unable to take due to nausea and other side effects, pt states he notified primary care provider about side effects and will not be taking the medication, reports continues working with Child psychotherapist, pt feels he has depression due to estrangement issues with his adult children  Planned Interventions: Advised patient to track and manage COPD triggers Provided instruction about proper use of medications used for management of COPD including inhalers Advised patient to self assesses COPD action plan zone and make appointment with provider if in the yellow zone for 48 hours without improvement Advised patient to engage in light exercise as tolerated 3-5 days a week to aid in the the management of COPD Provided education about and advised patient to utilize infection prevention strategies to reduce risk of respiratory infection Discussed the importance of adequate rest and management of  fatigue with COPD Reinforced safety precautions Reviewed all upcoming scheduled appointments Reviewed plan of care with pt including case closure  Symptom Management: Take medications as prescribed   Attend all scheduled provider appointments Call pharmacy for medication refills 3-7 days in advance of running out of medications Attend church or other social activities Call provider office for new concerns or questions  identify and remove indoor air pollutants limit outdoor activity during cold weather listen for public air quality announcements every day do breathing exercises every day develop a rescue plan eliminate symptom triggers at home follow rescue plan if symptoms flare-up get at least 7 to 8 hours of sleep at night practice relaxation or meditation daily Follow COPD action plan- call your doctor early on for change in health status fall prevention strategies: change position slowly, use assistive device such as walker or cane (per provider recommendations) when walking, keep walkways clear, have good lighting in room. It is important to contact your provider if you have any falls, maintain muscle strength/tone by exercise per provider recommendations. Continue to work with Child psychotherapist as needed Case closure with nurse only        COMPLETED: CCM (DIABETES) EXPECTED OUTCOME: MONITOR, SELF-MANAGE AND REDUCE SYMPTOMS OF DIABETES       Current Barriers:  Knowledge Deficits related to Diabetes management Care Coordination needs related to social work needs in a patient with Diabetes Chronic Disease Management support and education needs related to Diabetes, diet Corporate treasurer. -   Patient reports CBG is checked with continuous glucose monitor, fasting ranges 100 with todays reading 118,  random ranges 100's, reports he has a friend that moved in with him and is cooking healthy meals, feels like he has improvement in blood sugar due to eating healthy meals AIC is 7.1  on 01/11/23 Patient reports he has low energy and does not exercise, has walker and cane and uses as needed  Planned Interventions: Provided education to patient about basic DM disease process; Reviewed medications with patient and discussed importance of medication adherence;        Counseled on importance of regular laboratory monitoring as prescribed;        Provided patient with written educational materials related to hypo and hyperglycemia and importance of correct treatment;       Advised patient, providing education and rationale, to check cbg CGM per pt  and record        Review of patient status, including review of consultants reports, relevant laboratory and other test results, and medications completed;       Reviewed safety precautions  Symptom Management: Take medications as prescribed   Attend all scheduled provider appointments Call pharmacy for medication refills 3-7 days in advance of running out of medications Attend church or other social activities Call provider office for new concerns or questions  Work with the social worker to address care coordination needs and will continue to work with the clinical team to address health care and disease management related needs check blood sugar at prescribed times: continuous glucose monitor per patient report  check feet daily for cuts, sores or redness enter blood sugar readings and medication or insulin into daily log take the blood sugar log to all doctor visits take the blood sugar meter to all doctor visits trim toenails straight across fill half of plate with vegetables limit fast food meals to no more than 1 per week manage portion size prepare main meal at home 3 to 5 days each week read food labels for fat, fiber, carbohydrates and portion size set a realistic goal keep feet up while sitting        COMPLETED: CCM (HYPERTENSION) EXPECTED OUTCOME: MONITOR, SELF-MANAGE AND REDUCE SYMPTOMS OF HYPERTENSION        Current Barriers:  Knowledge Deficits related to Hypertension management Chronic Disease Management support and education needs related to Hypertension, diet Financial Constraints.  Patient reports he has blood pressure cuff but does not monitor blood pressure  Planned Interventions: Evaluation of current treatment plan related to hypertension self management and patient's adherence to plan as established by provider;   Reviewed medications with patient and discussed importance of compliance;  Counseled on the importance of exercise goals with target of 150 minutes per week Discussed plans with patient for ongoing care management follow up and provided patient with direct contact information for care management team; Advised patient, providing education and rationale, to monitor blood pressure daily and record, calling PCP for findings outside established parameters;  Discussed complications of poorly controlled blood pressure such as heart disease, stroke, circulatory complications, vision complications, kidney impairment, sexual dysfunction;  Reinforced low sodium diet and importance of reading food labels Reviewed plan of care with pt including case closure  Symptom Management: Take medications as prescribed   Attend all scheduled provider appointments Call pharmacy for medication refills 3-7 days in advance of running out of medications Attend church or other social activities Call provider office for new concerns or questions  check blood pressure weekly choose a place to take my blood pressure (home, clinic or office, retail store) write  blood pressure results in a log or diary learn about high blood pressure keep a blood pressure log take blood pressure log to all doctor appointments keep all doctor appointments take medications for blood pressure exactly as prescribed report new symptoms to your doctor eat more whole grains, fruits and vegetables, lean meats and healthy  fats Follow low sodium diet Read food labels for sodium content Limit/ avoid fast food            Plan: No further follow up required: case closure for nurse  Irving Shows [facility], BSN [place]/ Ambulatory Care Management [phone]

## 2023-02-02 NOTE — Patient Instructions (Signed)
Visit Information  Thank you for taking time to visit with me today. Please don't hesitate to contact me if I can be of assistance to you before our next scheduled telephone appointment.  Following are the goals we discussed today:   Goals Addressed             This Visit's Progress    COMPLETED: CCM (COPD) EXPECTED OUTCOME: MONITOR, SELF-MANAGE AND REDUCE SYMTOM OF COPD       Current Barriers:  Knowledge Deficits related to COPD management Chronic Disease Management support and education needs related to COPD, action plan, falls Financial Constraints. - Child psychotherapist continues working with pt Patient reports he has all medications and taking as prescribed Patient has shower seat, walker, cane, has relatives he can call on if needed Patient has had several falls this past year due to having balance issues at times, no falls since last conversation Patient recently prescribed new medication for depression and pt unable to take due to nausea and other side effects, pt states he notified primary care provider about side effects and will not be taking the medication, reports continues working with Child psychotherapist, pt feels he has depression due to estrangement issues with his adult children  Planned Interventions: Advised patient to track and manage COPD triggers Provided instruction about proper use of medications used for management of COPD including inhalers Advised patient to self assesses COPD action plan zone and make appointment with provider if in the yellow zone for 48 hours without improvement Advised patient to engage in light exercise as tolerated 3-5 days a week to aid in the the management of COPD Provided education about and advised patient to utilize infection prevention strategies to reduce risk of respiratory infection Discussed the importance of adequate rest and management of fatigue with COPD Reinforced safety precautions Reviewed all upcoming scheduled  appointments Reviewed plan of care with pt including case closure  Symptom Management: Take medications as prescribed   Attend all scheduled provider appointments Call pharmacy for medication refills 3-7 days in advance of running out of medications Attend church or other social activities Call provider office for new concerns or questions  identify and remove indoor air pollutants limit outdoor activity during cold weather listen for public air quality announcements every day do breathing exercises every day develop a rescue plan eliminate symptom triggers at home follow rescue plan if symptoms flare-up get at least 7 to 8 hours of sleep at night practice relaxation or meditation daily Follow COPD action plan- call your doctor early on for change in health status fall prevention strategies: change position slowly, use assistive device such as walker or cane (per provider recommendations) when walking, keep walkways clear, have good lighting in room. It is important to contact your provider if you have any falls, maintain muscle strength/tone by exercise per provider recommendations. Continue to work with Child psychotherapist as needed Case closure with nurse only        COMPLETED: CCM (DIABETES) EXPECTED OUTCOME: MONITOR, SELF-MANAGE AND REDUCE SYMPTOMS OF DIABETES       Current Barriers:  Knowledge Deficits related to Diabetes management Care Coordination needs related to social work needs in a patient with Diabetes Chronic Disease Management support and education needs related to Diabetes, diet Corporate treasurer. -   Patient reports CBG is checked with continuous glucose monitor, fasting ranges 100 with todays reading 118,  random ranges 100's, reports he has a friend that moved in with him and is cooking healthy meals, feels  like he has improvement in blood sugar due to eating healthy meals AIC is 7.1 on 01/11/23 Patient reports he has low energy and does not exercise, has walker and  cane and uses as needed  Planned Interventions: Provided education to patient about basic DM disease process; Reviewed medications with patient and discussed importance of medication adherence;        Counseled on importance of regular laboratory monitoring as prescribed;        Provided patient with written educational materials related to hypo and hyperglycemia and importance of correct treatment;       Advised patient, providing education and rationale, to check cbg CGM per pt  and record        Review of patient status, including review of consultants reports, relevant laboratory and other test results, and medications completed;       Reviewed safety precautions  Symptom Management: Take medications as prescribed   Attend all scheduled provider appointments Call pharmacy for medication refills 3-7 days in advance of running out of medications Attend church or other social activities Call provider office for new concerns or questions  Work with the social worker to address care coordination needs and will continue to work with the clinical team to address health care and disease management related needs check blood sugar at prescribed times: continuous glucose monitor per patient report  check feet daily for cuts, sores or redness enter blood sugar readings and medication or insulin into daily log take the blood sugar log to all doctor visits take the blood sugar meter to all doctor visits trim toenails straight across fill half of plate with vegetables limit fast food meals to no more than 1 per week manage portion size prepare main meal at home 3 to 5 days each week read food labels for fat, fiber, carbohydrates and portion size set a realistic goal keep feet up while sitting        COMPLETED: CCM (HYPERTENSION) EXPECTED OUTCOME: MONITOR, SELF-MANAGE AND REDUCE SYMPTOMS OF HYPERTENSION       Current Barriers:  Knowledge Deficits related to Hypertension management Chronic  Disease Management support and education needs related to Hypertension, diet Financial Constraints.  Patient reports he has blood pressure cuff but does not monitor blood pressure  Planned Interventions: Evaluation of current treatment plan related to hypertension self management and patient's adherence to plan as established by provider;   Reviewed medications with patient and discussed importance of compliance;  Counseled on the importance of exercise goals with target of 150 minutes per week Discussed plans with patient for ongoing care management follow up and provided patient with direct contact information for care management team; Advised patient, providing education and rationale, to monitor blood pressure daily and record, calling PCP for findings outside established parameters;  Discussed complications of poorly controlled blood pressure such as heart disease, stroke, circulatory complications, vision complications, kidney impairment, sexual dysfunction;  Reinforced low sodium diet and importance of reading food labels Reviewed plan of care with pt including case closure  Symptom Management: Take medications as prescribed   Attend all scheduled provider appointments Call pharmacy for medication refills 3-7 days in advance of running out of medications Attend church or other social activities Call provider office for new concerns or questions  check blood pressure weekly choose a place to take my blood pressure (home, clinic or office, retail store) write blood pressure results in a log or diary learn about high blood pressure keep a blood pressure log take  blood pressure log to all doctor appointments keep all doctor appointments take medications for blood pressure exactly as prescribed report new symptoms to your doctor eat more whole grains, fruits and vegetables, lean meats and healthy fats Follow low sodium diet Read food labels for sodium content Limit/ avoid fast  food            Please call the care guide team at [phone] if you need to cancel or reschedule your appointment.   If you are experiencing a Mental Health or Behavioral Health Crisis or need someone to talk to, please call the Suicide and Crisis Lifeline: 988 call the [place] National Suicide Prevention Lifeline: (334) 646-3626 or TTY: (559)593-3689 TTY 773-540-0324) to talk to a trained counselor call 1-800-273-TALK (toll free, 24 hour hotline) go to [facility] [address] [place] [phone]) call the [facility] Line: [phone] call 911   The patient verbalized understanding of instructions, educational materials, and care plan provided today and DECLINED offer to receive copy of patient instructions, educational materials, and care plan.   No further follow up required: case closure  Irving Shows [facility], BSN [place]/ Ambulatory Care Management [phone]

## 2023-02-07 ENCOUNTER — Ambulatory Visit: Payer: Medicare Other | Admitting: Surgical

## 2023-02-07 ENCOUNTER — Other Ambulatory Visit (HOSPITAL_BASED_OUTPATIENT_CLINIC_OR_DEPARTMENT_OTHER): Payer: Self-pay

## 2023-02-07 ENCOUNTER — Other Ambulatory Visit (HOSPITAL_COMMUNITY): Payer: Self-pay

## 2023-02-07 ENCOUNTER — Encounter: Payer: Medicare Other | Admitting: Physician Assistant

## 2023-02-07 ENCOUNTER — Encounter: Payer: Self-pay | Admitting: Surgical

## 2023-02-07 VITALS — BP 142/72 | HR 75

## 2023-02-07 DIAGNOSIS — D489 Neoplasm of uncertain behavior, unspecified: Secondary | ICD-10-CM

## 2023-02-07 NOTE — Progress Notes (Signed)
Patient ID: Paul [name], male    DOB: May 08, 1950, 72 y.o.   MRN: [id number]  Chief Complaint  Patient presents with   Pre-op Exam      ICD-10-CM   1. Neoplasm, uncertain whether benign or malignant  D48.9       History of Present Illness: Paul [name] is a 72 y.o.  male  with a history of Left hand lesion.  He presents for preoperative evaluation for upcoming procedure, excision of skin lesion of left hand - possible tissue rearrangement, possible skin graft, possible myriad placement, scheduled for 03/06/2023 with Dr. Ladona Ridgel.  The patient has not had problems with anesthesia. No history of DVT/PE.  No family history of DVT/PE.  No family or personal history of bleeding or clotting disorders. He is on ASA 81 mg daily, he has received clearance to hold this 7 days prior to surgery. He is on Celebrex which he takes daily for pain, he is aware to hold this 5 days prior to surgery. He has history of CAD, underwent CABG in 2022. He denies any worsening cardiac or pulmonary symptoms recently.  He does have chronic DOE which is unchanged at this time, he feels as if he is at his baseline at this time.  Summary of Previous Visit: Patient with a raised skin lesion on the dorsum of his left hand overlying the first metacarpal.  The lesion has grown over 3 months.  He reports he had a trauma in this area while woodworking.  PMH Significant for: Coronary artery disease, underwent CABG 2 years ago.  History of COPD, well-controlled at this time.  Chronic DOE, currently at baseline.  OSA - does not use CPAP.  Chronic pain for which he is on Norco daily.  Chronic thrombocytopenia.  Patient denies any active chest pain.  He does have chronic shortness of breath.  "Per office protocol, if patient is without any new symptoms or concerns at the time of their virtual visit, he/she may hold ASA for 7 days prior to procedure. Please resume ASA as soon as possible postprocedure, at the discretion of the  surgeon.    Therefore, based on ACC/AHA guidelines, the patient would be at acceptable risk for the planned procedure without further cardiovascular testing."  Patient did receive clearance from PCP and cardiology.   Past Medical History: Allergies: Allergies  Allergen Reactions   Morphine And Codeine Itching    Possible itching due to morphine 01/26/21   Cortisone    Other Other (See Comments)    Cats- eyes burn    Current Medications:  Current Outpatient Medications:    acetaminophen (TYLENOL) 500 MG tablet, Take 1,500-2,000 mg by mouth daily., Disp: , Rfl:    albuterol (VENTOLIN HFA) 108 (90 Base) MCG/ACT inhaler, Inhale 2 puffs into the lungs every 4 (four) hours as needed. For breathing., Disp: 18 g, Rfl: 3   allopurinol (ZYLOPRIM) 100 MG tablet, Take 1 tablet (100 mg total) by mouth daily., Disp: 90 tablet, Rfl: 3   AMBULATORY NON FORMULARY MEDICATION, Medical alert device as covered by insurance for frequent falls and weakness., Disp: 1 each, Rfl: 0   amLODipine (NORVASC) 5 MG tablet, Take 1 tablet (5 mg total) by mouth daily., Disp: 90 tablet, Rfl: 3   aspirin 81 MG EC tablet, Take 1 tablet (81 mg total) by mouth daily., Disp: 30 tablet, Rfl: 0   celecoxib (CELEBREX) 100 MG capsule, Take 1 capsule (100 mg total) by mouth 2 (two) times daily., Disp:  180 capsule, Rfl: 3   Continuous Glucose Receiver (FREESTYLE LIBRE 3 READER) DEVI, Use with freestyle libre 3 sensor, Disp: 1 each, Rfl: 1   Continuous Glucose Sensor (FREESTYLE LIBRE 3 SENSOR) MISC, , Disp: , Rfl:    DULoxetine (CYMBALTA) 20 MG capsule, Take 1 capsule (20 mg total) by mouth daily for 7 days, THEN 2 capsules (40 mg total) daily. For pain management.., Disp: 173 capsule, Rfl: 0   gabapentin (NEURONTIN) 300 MG capsule, Take 2 capsules (600 mg total) by mouth 3 (three) times daily. For back and foot pain., Disp: 540 capsule, Rfl: 3   glucose blood (ONETOUCH VERIO) test strip, Use to check blood glucose 3 times daily,  Disp: 300 each, Rfl: 3   HYDROcodone-acetaminophen (NORCO/VICODIN) 5-325 MG tablet, Take 1-2 tablets by mouth in the morning AND 1 tablet daily at 12 noon AND 1 tablet at bedtime., Disp: 120 tablet, Rfl: 0   insulin glargine, 1 Unit Dial, (TOUJEO SOLOSTAR) 300 UNIT/ML Solostar Pen, Inject 40 Units into the skin daily., Disp: 18 mL, Rfl: 3   insulin lispro (HUMALOG KWIKPEN) 200 UNIT/ML KwikPen, INJECT 14 UNITS UNDER THE SKIN EVERY MORNING, 14 UNITS WITH LUNCH AND 14 UNITS WITH SUPPER, DAILY, Disp: 12 mL, Rfl: 5   Insulin Pen Needle 32G X 4 MM MISC, Use daily with insulin as directed, Disp: 100 each, Rfl: 3   Iron, Ferrous Sulfate, 325 (65 Fe) MG TABS, Take 1 tablet (325 mg) by mouth daily., Disp: 30 tablet, Rfl: 3   Lidocaine (ZTLIDO) 1.8 % PTCH, Apply 1 patch topically daily. (May wear up to 12hours.), Disp: 30 patch, Rfl: 2   metFORMIN (GLUCOPHAGE-XR) 500 MG 24 hr tablet, Take 2 tablets (1,000 mg total) by mouth 2 (two) times daily., Disp: 360 tablet, Rfl: 3   metoprolol tartrate (LOPRESSOR) 25 MG tablet, Take 1 tablet (25 mg total) by mouth 2 (two) times daily., Disp: 180 tablet, Rfl: 3   pantoprazole (PROTONIX) 40 MG tablet, Take 1 tablet (40 mg total) by mouth daily., Disp: 90 tablet, Rfl: 3   potassium chloride (KLOR-CON M) 10 MEQ tablet, Take 1 tablet (10 mEq total) by mouth daily., Disp: 90 tablet, Rfl: 3   rosuvastatin (CRESTOR) 20 MG tablet, Take 1 tablet (20 mg total) by mouth daily., Disp: 90 tablet, Rfl: 3   Tetrahydrozoline HCl (VISINE EXTRA OP), Place 1 drop into both eyes as needed (itching)., Disp: , Rfl:    umeclidinium-vilanterol (ANORO ELLIPTA) 62.5-25 MCG/ACT AEPB, Inhale 1 puff into the lungs daily., Disp: 90 each, Rfl: 3   zolpidem (AMBIEN) 10 MG tablet, Take 1 tablet (10 mg total) by mouth at bedtime as needed for sleep, Disp: 30 tablet, Rfl: 2   [START ON 02/28/2023] HYDROcodone-acetaminophen (NORCO/VICODIN) 5-325 MG tablet, Take 1-2 tablets by mouth in the morning AND 1 tablet  daily at 12 noon AND 1 tablet at bedtime. (Patient not taking: Reported on 02/07/2023), Disp: 120 tablet, Rfl: 0   HYDROcodone-acetaminophen (NORCO/VICODIN) 5-325 MG tablet, Take 1-2 tablets by mouth in the morning AND 1 tablet daily at 12 noon AND 1 tablet at bedtime. (Patient not taking: Reported on 02/07/2023), Disp: 120 tablet, Rfl: 0  Past Medical Problems: Past Medical History:  Diagnosis Date   Angina, class III (HCC)    Anxiety    Arthritis    Bilateral carotid artery stenosis    BPH without obstruction/lower urinary tract symptoms    CAD 11/24/2011   a) Mild-to-moderate 30-40% lesions in the RCA, LAD and Circumflex. b)  CULPRIT LESION: long tubular 70-80% lesion in D1 with FFR of 0.7 --> PCI w/ Xience Xpedition DES 2.5 mm x 30 mm (2.65 MM); c) Lexiscan Myoview 11/2013: No Ischemia or Infarct (Inferior Gut Attenuation) EF 63%.   Chronic fatigue syndrome 06/13/2021   Chronic heart failure (HCC)    Chronic kidney disease (CKD), stage III (moderate) (HCC)    Chronic pain syndrome    Cigarette smoker 02/22/2014   Cirrhosis (HCC)    Constipation by delayed colonic transit 06/13/2021   COPD (chronic obstructive pulmonary disease) (HCC)    "don't have full case of it; I'm right there at it"   Depression    Diabetes mellitus without complication (HCC)    Diverticulosis    Dysphagia    Elevated alkaline phosphatase level 08/17/2022   Elevated levels of transaminase & lactic acid dehydrogenase 06/09/2016   Essential hypertension 11/24/2011   Exertional dyspnea, chronic    Fatigue 12/08/2021   Former smoker    GERD (gastroesophageal reflux disease)    Gout    H/O: GI bleed    Hearing loss    Hyperglycemia 03/11/2019   Hyperlipidemia with target LDL less than 70 11/24/2011   Hypo-osmolality and hyponatremia 03/11/2019   Iron deficiency anemia    Lipoprotein deficiency disorder    Long term (current) use of insulin (HCC) 07/15/2019   Obesity (BMI 30.0-34.9)    OSA (obstructive  sleep apnea), uses oxygen at home did not tolerate cpap 11/24/2011   Pain due to onychomycosis of toenails of both feet 08/23/2022   Pain in both lower extremities 02/14/2022   Paresthesia of skin 02/17/2015   Peripheral vascular disease (HCC)    Portal hypertension (HCC)    Right hip pain 06/13/2021   S/P CABG (coronary artery bypass graft)    Shoulder joint pain 02/17/2015   Spinal stenosis    Splenomegaly    Thrombocytopenia (HCC)    Vitamin D deficiency     Past Surgical History: Past Surgical History:  Procedure Laterality Date   CERVICAL SPINE SURGERY  2012   CORONARY ANGIOPLASTY WITH STENT PLACEMENT  11/23/2011   "1; first one"   CORONARY ARTERY BYPASS GRAFT N/A 01/25/2021   Procedure: CORONARY ARTERY BYPASS GRAFTING (CABG) X2, USING LEFT INTERNAL MAMMARY ARTERY AND LEFT LEG GREATER SAPHENOUS VEIN HARVESTED ENDOSCOPICALLY;  Surgeon: Corliss Skains, MD;  Location: [facility] OR;  Service: Open Heart Surgery;  Laterality: N/A;   ENDOVEIN HARVEST OF GREATER SAPHENOUS VEIN Bilateral 01/25/2021   Procedure: ENDOVEIN HARVEST OF GREATER SAPHENOUS VEIN;  Surgeon: Corliss Skains, MD;  Location: [facility] OR;  Service: Open Heart Surgery;  Laterality: Bilateral;   LEFT HEART CATH AND CORONARY ANGIOGRAPHY N/A 12/28/2020   Procedure: LEFT HEART CATH AND CORONARY ANGIOGRAPHY;  Surgeon: Marykay Lex, MD;  Location: [facility] INVASIVE CV LAB;  Service: Cardiovascular;  Laterality: N/A;   LEFT HEART CATHETERIZATION WITH CORONARY ANGIOGRAM N/A 11/23/2011   Procedure: LEFT HEART CATHETERIZATION WITH CORONARY ANGIOGRAM;  Surgeon: Marykay Lex, MD;  Location: [facility] CATH LAB;  Service: Cardiovascular;  Laterality: N/A;   TEE WITHOUT CARDIOVERSION N/A 01/25/2021   Procedure: TRANSESOPHAGEAL ECHOCARDIOGRAM (TEE);  Surgeon: Corliss Skains, MD;  Location: [facility] OR;  Service: Open Heart Surgery;  Laterality: N/A;    Social History: Social History   Socioeconomic History   Marital status: Divorced     Spouse name: Not on file   Number of children: 3   Years of education: 11th   Highest education level: 11th grade  Occupational History   Not on file  Tobacco Use   Smoking status: Former    Current packs/day: 0.00    Average packs/day: 1 pack/day for 40.0 years (40.0 ttl pk-yrs)    Types: Cigarettes    Start date: 06/10/1976    Quit date: 06/10/2016    Years since quitting: 6.6    Passive exposure: Past   Smokeless tobacco: Never   Tobacco comments:    02/18/14- smokes occ cig maybe 3 x per wk  Vaping Use   Vaping status: Never Used  Substance and Sexual Activity   Alcohol use: Yes    Alcohol/week: 0.0 standard drinks of alcohol    Comment: social 2-3 drink a week   Drug use: No   Sexual activity: Not Currently    Partners: Female  Other Topics Concern   Not on file  Social History Narrative   He is a 72 y.o. divorced father of 3, grandfather 1.   He is a retired Equities trader, former Nutritional therapist. 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   He quit smoking in August 2013, after smoking a pack a day for roughly 40 years.   He drinks socially 2-3 drinks a week only.   He does not get routine exercise, mostly due to 2 fatigue and dyspnea. Otherwise been relatively sedentary.   Social Determinants of Health   Financial Resource Strain: Low Risk  (11/21/2022)   Overall Financial Resource Strain (CARDIA)    Difficulty of Paying Living Expenses: Not hard at all  Food Insecurity: No Food Insecurity (11/21/2022)   Hunger Vital Sign    Worried About Running Out of Food in the Last Year: Never true    Ran Out of Food in the Last Year: Never true  Transportation Needs: No Transportation Needs (11/21/2022)   PRAPARE - Administrator, Civil Service (Medical): No    Lack of Transportation (Non-Medical): No  Physical Activity: Inactive (11/21/2022)   Exercise Vital Sign    Days of Exercise per  Week: 0 days    Minutes of Exercise per Session: 0 min  Stress: No Stress Concern Present (11/21/2022)   [employer] of Occupational Health - Occupational Stress Questionnaire    Feeling of Stress : Not at all  Social Connections: Socially Isolated (11/21/2022)   Social Connection and Isolation Panel [NHANES]    Frequency of Communication with Friends and Family: More than three times a week    Frequency of Social Gatherings with Friends and Family: Three times a week    Attends Religious Services: Never    Active Member of Clubs or Organizations: No    Attends Banker Meetings: Never    Marital Status: Divorced  Catering manager Violence: Not At Risk (11/21/2022)   Humiliation, Afraid, Rape, and Kick questionnaire    Fear of Current or Ex-Partner: No    Emotionally Abused: No    Physically Abused: No    Sexually Abused: No    Family History: Family History  Problem Relation Age of Onset   Heart disease Sister    Heart attack Brother    Emphysema Father        smoked   Aneurysm Father     Review of Systems: Review of Systems  Constitutional: Negative.   Respiratory: Negative.    Cardiovascular: Negative.   Gastrointestinal: Negative.   Genitourinary: Negative.   Musculoskeletal: Negative.   Neurological: Negative.  Physical Exam: Vital Signs BP (!) 142/72 (BP Location: Right Arm, Patient Position: Sitting, Cuff Size: Normal)   Pulse 75   SpO2 98%   Physical Exam Constitutional:      General: Not in acute distress.    Appearance: Normal appearance. Not ill-appearing.  HENT:     Head: Normocephalic and atraumatic.  Eyes:     Pupils: Pupils are equal, round Neck:     Musculoskeletal: Normal range of motion.  Cardiovascular:     Rate and Rhythm: Normal rate    Pulses: Normal pulses.  Pulmonary:     Effort: Pulmonary effort is normal. No respiratory distress.  Abdominal:     General: Abdomen is flat. There is no distension.  Musculoskeletal:  Normal range of motion.  Skin:    General: Skin is warm and dry.     Findings: No erythema or rash.  Neurological:     General: No focal deficit present.     Mental Status: Alert and oriented to person, place, and time. Mental status is at baseline.     Motor: No weakness.  Psychiatric:        Mood and Affect: Mood normal.        Behavior: Behavior normal.    Assessment/Plan: The patient is scheduled for excision of skin lesion of left hand, possible local tissue rearrangement, possible skin graft, possible myriad with Dr. Ulice Bold.  Risks, benefits, and alternatives of procedure discussed, questions answered and consent obtained.    Smoking Status: Not currently smoking; Counseling Given?  N/A  Caprini Score: 7, high; Risk Factors include: Age, history of COPD, swelling of lower extremities (ankles and feet per patient), BMI > 25, and length of planned surgery. Recommendation for mechanical prophylaxis. Encourage early ambulation.  Will have patient restart ASA 81 mg postop day 1  Pictures obtained: @consult   Post-op Rx sent to pharmacy:  No prescription sent at this time.  Patient is on chronic Norco prescribed by pain management provider, discussed with patient he should receive clearance from pain management prior to sending any prescriptions.  We discussed sending additional Norco, approximately 5-7 doses for breakthrough pain.  He currently takes Norco in a.m. and p.m.  Patient was provided with the General Surgical Risk consent document and Pain Medication Agreement prior to their appointment.  They had adequate time to read through the risk consent documents and Pain Medication Agreement. We also discussed them in person together during this preop appointment. All of their questions were answered to their satisfaction.  Recommended calling if they have any further questions.  Risk consent form and Pain Medication Agreement to be scanned into patient's chart.  The risks that can be  encountered with and after a skin graft were discussed and include the following but not limited to these: bleeding, infection, delayed healing, anesthesia risks, skin sensation changes, injury to structures including nerves, blood vessels, and muscles which may be temporary or permanent, allergies to tape, suture materials and glues, blood products, topical preparations or injected agents, skin contour irregularities, skin discoloration and swelling, deep vein thrombosis, cardiac and pulmonary complications, pain, which may persist, failure of the graft and possible need for revisional surgery or staged procedures.  The risks that can be encountered with and after excision of a skin lesion were discussed and include the following but not limited to these: bleeding, infection, delayed healing, anesthesia risks, skin sensation changes, injury to structures including nerves, blood vessels, and muscles which may be temporary or permanent, allergies to  tape, suture materials and glues, blood products, topical preparations or injected agents, skin contour irregularities, skin discoloration and swelling, deep vein thrombosis, cardiac and pulmonary complications, pain, which may persist, persistent pain, recurrence of the lesion, poor healing of the incision, possible need for revisional surgery or staged procedures.  We discussed the possibility of positive margins which require additional surgical excision.  We discussed the possibility of split-thickness skin graft, application of wound matrix, local tissue rearrangement.  We discussed the various postoperative recovery periods for each of these procedures.  We discussed the expected wound care protocol for each of these procedures.   Electronically signed by: [name] Breezie Micucci, PA-C 02/07/2023 1:04 PM

## 2023-02-07 NOTE — H&P (View-Only) (Signed)
 Patient ID: Paul [name], male    DOB: May 08, 1950, 72 y.o.   MRN: [id number]  Chief Complaint  Patient presents with   Pre-op Exam      ICD-10-CM   1. Neoplasm, uncertain whether benign or malignant  D48.9       History of Present Illness: Paul [name] is a 72 y.o.  male  with a history of Left hand lesion.  He presents for preoperative evaluation for upcoming procedure, excision of skin lesion of left hand - possible tissue rearrangement, possible skin graft, possible myriad placement, scheduled for 03/06/2023 with Dr. Ladona Ridgel.  The patient has not had problems with anesthesia. No history of DVT/PE.  No family history of DVT/PE.  No family or personal history of bleeding or clotting disorders. He is on ASA 81 mg daily, he has received clearance to hold this 7 days prior to surgery. He is on Celebrex which he takes daily for pain, he is aware to hold this 5 days prior to surgery. He has history of CAD, underwent CABG in 2022. He denies any worsening cardiac or pulmonary symptoms recently.  He does have chronic DOE which is unchanged at this time, he feels as if he is at his baseline at this time.  Summary of Previous Visit: Patient with a raised skin lesion on the dorsum of his left hand overlying the first metacarpal.  The lesion has grown over 3 months.  He reports he had a trauma in this area while woodworking.  PMH Significant for: Coronary artery disease, underwent CABG 2 years ago.  History of COPD, well-controlled at this time.  Chronic DOE, currently at baseline.  OSA - does not use CPAP.  Chronic pain for which he is on Norco daily.  Chronic thrombocytopenia.  Patient denies any active chest pain.  He does have chronic shortness of breath.  "Per office protocol, if patient is without any new symptoms or concerns at the time of their virtual visit, he/she may hold ASA for 7 days prior to procedure. Please resume ASA as soon as possible postprocedure, at the discretion of the  surgeon.    Therefore, based on ACC/AHA guidelines, the patient would be at acceptable risk for the planned procedure without further cardiovascular testing."  Patient did receive clearance from PCP and cardiology.   Past Medical History: Allergies: Allergies  Allergen Reactions   Morphine And Codeine Itching    Possible itching due to morphine 01/26/21   Cortisone    Other Other (See Comments)    Cats- eyes burn    Current Medications:  Current Outpatient Medications:    acetaminophen (TYLENOL) 500 MG tablet, Take 1,500-2,000 mg by mouth daily., Disp: , Rfl:    albuterol (VENTOLIN HFA) 108 (90 Base) MCG/ACT inhaler, Inhale 2 puffs into the lungs every 4 (four) hours as needed. For breathing., Disp: 18 g, Rfl: 3   allopurinol (ZYLOPRIM) 100 MG tablet, Take 1 tablet (100 mg total) by mouth daily., Disp: 90 tablet, Rfl: 3   AMBULATORY NON FORMULARY MEDICATION, Medical alert device as covered by insurance for frequent falls and weakness., Disp: 1 each, Rfl: 0   amLODipine (NORVASC) 5 MG tablet, Take 1 tablet (5 mg total) by mouth daily., Disp: 90 tablet, Rfl: 3   aspirin 81 MG EC tablet, Take 1 tablet (81 mg total) by mouth daily., Disp: 30 tablet, Rfl: 0   celecoxib (CELEBREX) 100 MG capsule, Take 1 capsule (100 mg total) by mouth 2 (two) times daily., Disp:  180 capsule, Rfl: 3   Continuous Glucose Receiver (FREESTYLE LIBRE 3 READER) DEVI, Use with freestyle libre 3 sensor, Disp: 1 each, Rfl: 1   Continuous Glucose Sensor (FREESTYLE LIBRE 3 SENSOR) MISC, , Disp: , Rfl:    DULoxetine (CYMBALTA) 20 MG capsule, Take 1 capsule (20 mg total) by mouth daily for 7 days, THEN 2 capsules (40 mg total) daily. For pain management.., Disp: 173 capsule, Rfl: 0   gabapentin (NEURONTIN) 300 MG capsule, Take 2 capsules (600 mg total) by mouth 3 (three) times daily. For back and foot pain., Disp: 540 capsule, Rfl: 3   glucose blood (ONETOUCH VERIO) test strip, Use to check blood glucose 3 times daily,  Disp: 300 each, Rfl: 3   HYDROcodone-acetaminophen (NORCO/VICODIN) 5-325 MG tablet, Take 1-2 tablets by mouth in the morning AND 1 tablet daily at 12 noon AND 1 tablet at bedtime., Disp: 120 tablet, Rfl: 0   insulin glargine, 1 Unit Dial, (TOUJEO SOLOSTAR) 300 UNIT/ML Solostar Pen, Inject 40 Units into the skin daily., Disp: 18 mL, Rfl: 3   insulin lispro (HUMALOG KWIKPEN) 200 UNIT/ML KwikPen, INJECT 14 UNITS UNDER THE SKIN EVERY MORNING, 14 UNITS WITH LUNCH AND 14 UNITS WITH SUPPER, DAILY, Disp: 12 mL, Rfl: 5   Insulin Pen Needle 32G X 4 MM MISC, Use daily with insulin as directed, Disp: 100 each, Rfl: 3   Iron, Ferrous Sulfate, 325 (65 Fe) MG TABS, Take 1 tablet (325 mg) by mouth daily., Disp: 30 tablet, Rfl: 3   Lidocaine (ZTLIDO) 1.8 % PTCH, Apply 1 patch topically daily. (May wear up to 12hours.), Disp: 30 patch, Rfl: 2   metFORMIN (GLUCOPHAGE-XR) 500 MG 24 hr tablet, Take 2 tablets (1,000 mg total) by mouth 2 (two) times daily., Disp: 360 tablet, Rfl: 3   metoprolol tartrate (LOPRESSOR) 25 MG tablet, Take 1 tablet (25 mg total) by mouth 2 (two) times daily., Disp: 180 tablet, Rfl: 3   pantoprazole (PROTONIX) 40 MG tablet, Take 1 tablet (40 mg total) by mouth daily., Disp: 90 tablet, Rfl: 3   potassium chloride (KLOR-CON M) 10 MEQ tablet, Take 1 tablet (10 mEq total) by mouth daily., Disp: 90 tablet, Rfl: 3   rosuvastatin (CRESTOR) 20 MG tablet, Take 1 tablet (20 mg total) by mouth daily., Disp: 90 tablet, Rfl: 3   Tetrahydrozoline HCl (VISINE EXTRA OP), Place 1 drop into both eyes as needed (itching)., Disp: , Rfl:    umeclidinium-vilanterol (ANORO ELLIPTA) 62.5-25 MCG/ACT AEPB, Inhale 1 puff into the lungs daily., Disp: 90 each, Rfl: 3   zolpidem (AMBIEN) 10 MG tablet, Take 1 tablet (10 mg total) by mouth at bedtime as needed for sleep, Disp: 30 tablet, Rfl: 2   [START ON 02/28/2023] HYDROcodone-acetaminophen (NORCO/VICODIN) 5-325 MG tablet, Take 1-2 tablets by mouth in the morning AND 1 tablet  daily at 12 noon AND 1 tablet at bedtime. (Patient not taking: Reported on 02/07/2023), Disp: 120 tablet, Rfl: 0   HYDROcodone-acetaminophen (NORCO/VICODIN) 5-325 MG tablet, Take 1-2 tablets by mouth in the morning AND 1 tablet daily at 12 noon AND 1 tablet at bedtime. (Patient not taking: Reported on 02/07/2023), Disp: 120 tablet, Rfl: 0  Past Medical Problems: Past Medical History:  Diagnosis Date   Angina, class III (HCC)    Anxiety    Arthritis    Bilateral carotid artery stenosis    BPH without obstruction/lower urinary tract symptoms    CAD 11/24/2011   a) Mild-to-moderate 30-40% lesions in the RCA, LAD and Circumflex. b)  CULPRIT LESION: long tubular 70-80% lesion in D1 with FFR of 0.7 --> PCI w/ Xience Xpedition DES 2.5 mm x 30 mm (2.65 MM); c) Lexiscan Myoview 11/2013: No Ischemia or Infarct (Inferior Gut Attenuation) EF 63%.   Chronic fatigue syndrome 06/13/2021   Chronic heart failure (HCC)    Chronic kidney disease (CKD), stage III (moderate) (HCC)    Chronic pain syndrome    Cigarette smoker 02/22/2014   Cirrhosis (HCC)    Constipation by delayed colonic transit 06/13/2021   COPD (chronic obstructive pulmonary disease) (HCC)    "don't have full case of it; I'm right there at it"   Depression    Diabetes mellitus without complication (HCC)    Diverticulosis    Dysphagia    Elevated alkaline phosphatase level 08/17/2022   Elevated levels of transaminase & lactic acid dehydrogenase 06/09/2016   Essential hypertension 11/24/2011   Exertional dyspnea, chronic    Fatigue 12/08/2021   Former smoker    GERD (gastroesophageal reflux disease)    Gout    H/O: GI bleed    Hearing loss    Hyperglycemia 03/11/2019   Hyperlipidemia with target LDL less than 70 11/24/2011   Hypo-osmolality and hyponatremia 03/11/2019   Iron deficiency anemia    Lipoprotein deficiency disorder    Long term (current) use of insulin (HCC) 07/15/2019   Obesity (BMI 30.0-34.9)    OSA (obstructive  sleep apnea), uses oxygen at home did not tolerate cpap 11/24/2011   Pain due to onychomycosis of toenails of both feet 08/23/2022   Pain in both lower extremities 02/14/2022   Paresthesia of skin 02/17/2015   Peripheral vascular disease (HCC)    Portal hypertension (HCC)    Right hip pain 06/13/2021   S/P CABG (coronary artery bypass graft)    Shoulder joint pain 02/17/2015   Spinal stenosis    Splenomegaly    Thrombocytopenia (HCC)    Vitamin D deficiency     Past Surgical History: Past Surgical History:  Procedure Laterality Date   CERVICAL SPINE SURGERY  2012   CORONARY ANGIOPLASTY WITH STENT PLACEMENT  11/23/2011   "1; first one"   CORONARY ARTERY BYPASS GRAFT N/A 01/25/2021   Procedure: CORONARY ARTERY BYPASS GRAFTING (CABG) X2, USING LEFT INTERNAL MAMMARY ARTERY AND LEFT LEG GREATER SAPHENOUS VEIN HARVESTED ENDOSCOPICALLY;  Surgeon: Corliss Skains, MD;  Location: [facility] OR;  Service: Open Heart Surgery;  Laterality: N/A;   ENDOVEIN HARVEST OF GREATER SAPHENOUS VEIN Bilateral 01/25/2021   Procedure: ENDOVEIN HARVEST OF GREATER SAPHENOUS VEIN;  Surgeon: Corliss Skains, MD;  Location: [facility] OR;  Service: Open Heart Surgery;  Laterality: Bilateral;   LEFT HEART CATH AND CORONARY ANGIOGRAPHY N/A 12/28/2020   Procedure: LEFT HEART CATH AND CORONARY ANGIOGRAPHY;  Surgeon: Marykay Lex, MD;  Location: [facility] INVASIVE CV LAB;  Service: Cardiovascular;  Laterality: N/A;   LEFT HEART CATHETERIZATION WITH CORONARY ANGIOGRAM N/A 11/23/2011   Procedure: LEFT HEART CATHETERIZATION WITH CORONARY ANGIOGRAM;  Surgeon: Marykay Lex, MD;  Location: [facility] CATH LAB;  Service: Cardiovascular;  Laterality: N/A;   TEE WITHOUT CARDIOVERSION N/A 01/25/2021   Procedure: TRANSESOPHAGEAL ECHOCARDIOGRAM (TEE);  Surgeon: Corliss Skains, MD;  Location: [facility] OR;  Service: Open Heart Surgery;  Laterality: N/A;    Social History: Social History   Socioeconomic History   Marital status: Divorced     Spouse name: Not on file   Number of children: 3   Years of education: 11th   Highest education level: 11th grade  Occupational History   Not on file  Tobacco Use   Smoking status: Former    Current packs/day: 0.00    Average packs/day: 1 pack/day for 40.0 years (40.0 ttl pk-yrs)    Types: Cigarettes    Start date: 06/10/1976    Quit date: 06/10/2016    Years since quitting: 6.6    Passive exposure: Past   Smokeless tobacco: Never   Tobacco comments:    02/18/14- smokes occ cig maybe 3 x per wk  Vaping Use   Vaping status: Never Used  Substance and Sexual Activity   Alcohol use: Yes    Alcohol/week: 0.0 standard drinks of alcohol    Comment: social 2-3 drink a week   Drug use: No   Sexual activity: Not Currently    Partners: Female  Other Topics Concern   Not on file  Social History Narrative   He is a 72 y.o. divorced father of 3, grandfather 1.   He is a retired Equities trader, former Nutritional therapist. 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   He quit smoking in August 2013, after smoking a pack a day for roughly 40 years.   He drinks socially 2-3 drinks a week only.   He does not get routine exercise, mostly due to 2 fatigue and dyspnea. Otherwise been relatively sedentary.   Social Determinants of Health   Financial Resource Strain: Low Risk  (11/21/2022)   Overall Financial Resource Strain (CARDIA)    Difficulty of Paying Living Expenses: Not hard at all  Food Insecurity: No Food Insecurity (11/21/2022)   Hunger Vital Sign    Worried About Running Out of Food in the Last Year: Never true    Ran Out of Food in the Last Year: Never true  Transportation Needs: No Transportation Needs (11/21/2022)   PRAPARE - Administrator, Civil Service (Medical): No    Lack of Transportation (Non-Medical): No  Physical Activity: Inactive (11/21/2022)   Exercise Vital Sign    Days of Exercise per  Week: 0 days    Minutes of Exercise per Session: 0 min  Stress: No Stress Concern Present (11/21/2022)   [employer] of Occupational Health - Occupational Stress Questionnaire    Feeling of Stress : Not at all  Social Connections: Socially Isolated (11/21/2022)   Social Connection and Isolation Panel [NHANES]    Frequency of Communication with Friends and Family: More than three times a week    Frequency of Social Gatherings with Friends and Family: Three times a week    Attends Religious Services: Never    Active Member of Clubs or Organizations: No    Attends Banker Meetings: Never    Marital Status: Divorced  Catering manager Violence: Not At Risk (11/21/2022)   Humiliation, Afraid, Rape, and Kick questionnaire    Fear of Current or Ex-Partner: No    Emotionally Abused: No    Physically Abused: No    Sexually Abused: No    Family History: Family History  Problem Relation Age of Onset   Heart disease Sister    Heart attack Brother    Emphysema Father        smoked   Aneurysm Father     Review of Systems: Review of Systems  Constitutional: Negative.   Respiratory: Negative.    Cardiovascular: Negative.   Gastrointestinal: Negative.   Genitourinary: Negative.   Musculoskeletal: Negative.   Neurological: Negative.  Physical Exam: Vital Signs BP (!) 142/72 (BP Location: Right Arm, Patient Position: Sitting, Cuff Size: Normal)   Pulse 75   SpO2 98%   Physical Exam Constitutional:      General: Not in acute distress.    Appearance: Normal appearance. Not ill-appearing.  HENT:     Head: Normocephalic and atraumatic.  Eyes:     Pupils: Pupils are equal, round Neck:     Musculoskeletal: Normal range of motion.  Cardiovascular:     Rate and Rhythm: Normal rate    Pulses: Normal pulses.  Pulmonary:     Effort: Pulmonary effort is normal. No respiratory distress.  Abdominal:     General: Abdomen is flat. There is no distension.  Musculoskeletal:  Normal range of motion.  Skin:    General: Skin is warm and dry.     Findings: No erythema or rash.  Neurological:     General: No focal deficit present.     Mental Status: Alert and oriented to person, place, and time. Mental status is at baseline.     Motor: No weakness.  Psychiatric:        Mood and Affect: Mood normal.        Behavior: Behavior normal.    Assessment/Plan: The patient is scheduled for excision of skin lesion of left hand, possible local tissue rearrangement, possible skin graft, possible myriad with Dr. Ulice Bold.  Risks, benefits, and alternatives of procedure discussed, questions answered and consent obtained.    Smoking Status: Not currently smoking; Counseling Given?  N/A  Caprini Score: 7, high; Risk Factors include: Age, history of COPD, swelling of lower extremities (ankles and feet per patient), BMI > 25, and length of planned surgery. Recommendation for mechanical prophylaxis. Encourage early ambulation.  Will have patient restart ASA 81 mg postop day 1  Pictures obtained: @consult   Post-op Rx sent to pharmacy:  No prescription sent at this time.  Patient is on chronic Norco prescribed by pain management provider, discussed with patient he should receive clearance from pain management prior to sending any prescriptions.  We discussed sending additional Norco, approximately 5-7 doses for breakthrough pain.  He currently takes Norco in a.m. and p.m.  Patient was provided with the General Surgical Risk consent document and Pain Medication Agreement prior to their appointment.  They had adequate time to read through the risk consent documents and Pain Medication Agreement. We also discussed them in person together during this preop appointment. All of their questions were answered to their satisfaction.  Recommended calling if they have any further questions.  Risk consent form and Pain Medication Agreement to be scanned into patient's chart.  The risks that can be  encountered with and after a skin graft were discussed and include the following but not limited to these: bleeding, infection, delayed healing, anesthesia risks, skin sensation changes, injury to structures including nerves, blood vessels, and muscles which may be temporary or permanent, allergies to tape, suture materials and glues, blood products, topical preparations or injected agents, skin contour irregularities, skin discoloration and swelling, deep vein thrombosis, cardiac and pulmonary complications, pain, which may persist, failure of the graft and possible need for revisional surgery or staged procedures.  The risks that can be encountered with and after excision of a skin lesion were discussed and include the following but not limited to these: bleeding, infection, delayed healing, anesthesia risks, skin sensation changes, injury to structures including nerves, blood vessels, and muscles which may be temporary or permanent, allergies to  tape, suture materials and glues, blood products, topical preparations or injected agents, skin contour irregularities, skin discoloration and swelling, deep vein thrombosis, cardiac and pulmonary complications, pain, which may persist, persistent pain, recurrence of the lesion, poor healing of the incision, possible need for revisional surgery or staged procedures.  We discussed the possibility of positive margins which require additional surgical excision.  We discussed the possibility of split-thickness skin graft, application of wound matrix, local tissue rearrangement.  We discussed the various postoperative recovery periods for each of these procedures.  We discussed the expected wound care protocol for each of these procedures.   Electronically signed by: [name] Breezie Micucci, PA-C 02/07/2023 1:04 PM

## 2023-02-08 ENCOUNTER — Encounter: Payer: Medicare Other | Admitting: Surgical

## 2023-02-09 ENCOUNTER — Other Ambulatory Visit: Payer: Self-pay

## 2023-02-09 ENCOUNTER — Other Ambulatory Visit (HOSPITAL_COMMUNITY): Payer: Self-pay

## 2023-02-15 ENCOUNTER — Encounter: Payer: Self-pay | Admitting: Licensed Clinical Social Worker

## 2023-02-16 ENCOUNTER — Telehealth: Payer: Self-pay | Admitting: Licensed Clinical Social Worker

## 2023-02-16 ENCOUNTER — Telehealth: Payer: Self-pay

## 2023-02-16 ENCOUNTER — Ambulatory Visit (INDEPENDENT_AMBULATORY_CARE_PROVIDER_SITE_OTHER): Payer: Medicare Other | Admitting: Medical

## 2023-02-16 VITALS — BP 122/80 | HR 68 | Wt 186.2 lb

## 2023-02-16 DIAGNOSIS — Z794 Long term (current) use of insulin: Secondary | ICD-10-CM

## 2023-02-16 DIAGNOSIS — R29898 Other symptoms and signs involving the musculoskeletal system: Secondary | ICD-10-CM

## 2023-02-16 DIAGNOSIS — R269 Unspecified abnormalities of gait and mobility: Secondary | ICD-10-CM | POA: Diagnosis not present

## 2023-02-16 DIAGNOSIS — Z79899 Other long term (current) drug therapy: Secondary | ICD-10-CM | POA: Diagnosis not present

## 2023-02-16 DIAGNOSIS — W19XXXD Unspecified fall, subsequent encounter: Secondary | ICD-10-CM | POA: Diagnosis not present

## 2023-02-16 DIAGNOSIS — G894 Chronic pain syndrome: Secondary | ICD-10-CM | POA: Diagnosis not present

## 2023-02-16 DIAGNOSIS — W19XXXA Unspecified fall, initial encounter: Secondary | ICD-10-CM

## 2023-02-16 DIAGNOSIS — M48061 Spinal stenosis, lumbar region without neurogenic claudication: Secondary | ICD-10-CM | POA: Diagnosis not present

## 2023-02-16 HISTORY — DX: Unspecified fall, initial encounter: W19.XXXA

## 2023-02-16 NOTE — Patient Outreach (Signed)
  Care Coordination   02/15/2023 Name: Paul [name] MRN: [id number] DOB: 1950-06-02   Care Coordination Outreach Attempts:  An unsuccessful telephone outreach was attempted for a scheduled appointment today.  Follow Up Plan:  Additional outreach attempts will be made to offer the patient care coordination information and services.   Encounter Outcome:  No Answer   Care Coordination Interventions:  No, not indicated    Jenel Lucks, MSW, LCSW [facility] Care Management [place]  [facility] [email or web address] Phone [phone] 5:49 AM

## 2023-02-16 NOTE — Progress Notes (Signed)
Subjective:  Paul [name] is a 72 y.o. male who presents for Chief Complaint  Patient presents with   trouble walking    Trouble walking. Dragging his feet a lot and getting worse, having a lot of back pain and sees pain management. Pt has started to fall some. Hurts to lift legs and sometimes has to grab the leg to help move it     Here for concerns..  In general he reports generalized weakness and not feeling well, trouble walking but has not necessarily needed.  Lately been dragging feet more.  He sees a specialist for his back but is having a lot of back pain.  He has had a recent fall into onto his couch at home  He denies numbness, tingling, no shortness of breath or chest pain, no confusion or slurred speech.  No dizziness or headaches.  He notes that his symptoms of fatigue and weakness are intermittent come and go in general chronically  No prior back surgery  He notes that he does get winded but this is not new as well.  He says he saw his heart doctor fairly recently and they felt like his heart was in good shape.  No fever, no bodyaches or chills, no cough or congestion  No blood in the urine or stool.  No nausea or vomiting or diarrhea  Last PT in a long time.  No other aggravating or relieving factors.    No other c/o.  Past Medical History:  Diagnosis Date   Angina, class III (HCC)    Anxiety    Arthritis    Bilateral carotid artery stenosis    BPH without obstruction/lower urinary tract symptoms    CAD 11/24/2011   a) Mild-to-moderate 30-40% lesions in the RCA, LAD and Circumflex. b) CULPRIT LESION: long tubular 70-80% lesion in D1 with FFR of 0.7 --> PCI w/ Xience Xpedition DES 2.5 mm x 30 mm (2.65 MM); c) Lexiscan Myoview 11/2013: No Ischemia or Infarct (Inferior Gut Attenuation) EF 63%.   Chronic fatigue syndrome 06/13/2021   Chronic heart failure (HCC)    Chronic kidney disease (CKD), stage III (moderate) (HCC)    Chronic pain syndrome    Cigarette smoker  02/22/2014   Cirrhosis (HCC)    Constipation by delayed colonic transit 06/13/2021   COPD (chronic obstructive pulmonary disease) (HCC)    "don't have full case of it; I'm right there at it"   Depression    Diabetes mellitus without complication (HCC)    Diverticulosis    Dysphagia    Elevated alkaline phosphatase level 08/17/2022   Elevated levels of transaminase & lactic acid dehydrogenase 06/09/2016   Essential hypertension 11/24/2011   Exertional dyspnea, chronic    Fatigue 12/08/2021   Former smoker    GERD (gastroesophageal reflux disease)    Gout    H/O: GI bleed    Hearing loss    Hyperglycemia 03/11/2019   Hyperlipidemia with target LDL less than 70 11/24/2011   Hypo-osmolality and hyponatremia 03/11/2019   Iron deficiency anemia    Lipoprotein deficiency disorder    Long term (current) use of insulin (HCC) 07/15/2019   Obesity (BMI 30.0-34.9)    OSA (obstructive sleep apnea), uses oxygen at home did not tolerate cpap 11/24/2011   Pain due to onychomycosis of toenails of both feet 08/23/2022   Pain in both lower extremities 02/14/2022   Paresthesia of skin 02/17/2015   Peripheral vascular disease (HCC)    Portal hypertension (HCC)  Right hip pain 06/13/2021   S/P CABG (coronary artery bypass graft)    Shoulder joint pain 02/17/2015   Spinal stenosis    Splenomegaly    Thrombocytopenia (HCC)    Vitamin D deficiency    Current Outpatient Medications on File Prior to Visit  Medication Sig Dispense Refill   acetaminophen (TYLENOL) 500 MG tablet Take 1,500-2,000 mg by mouth daily.     albuterol (VENTOLIN HFA) 108 (90 Base) MCG/ACT inhaler Inhale 2 puffs into the lungs every 4 (four) hours as needed. For breathing. 18 g 3   allopurinol (ZYLOPRIM) 100 MG tablet Take 1 tablet (100 mg total) by mouth daily. 90 tablet 3   celecoxib (CELEBREX) 100 MG capsule Take 1 capsule (100 mg total) by mouth 2 (two) times daily. 180 capsule 3   DULoxetine (CYMBALTA) 20 MG capsule  Take 1 capsule (20 mg total) by mouth daily for 7 days, THEN 2 capsules (40 mg total) daily. For pain management.. 173 capsule 0   gabapentin (NEURONTIN) 300 MG capsule Take 2 capsules (600 mg total) by mouth 3 (three) times daily. For back and foot pain. 540 capsule 3   HYDROcodone-acetaminophen (NORCO/VICODIN) 5-325 MG tablet Take 1-2 tablets by mouth in the morning AND 1 tablet daily at 12 noon AND 1 tablet at bedtime. 120 tablet 0   [START ON 02/28/2023] HYDROcodone-acetaminophen (NORCO/VICODIN) 5-325 MG tablet Take 1-2 tablets by mouth in the morning AND 1 tablet daily at 12 noon AND 1 tablet at bedtime. 120 tablet 0   HYDROcodone-acetaminophen (NORCO/VICODIN) 5-325 MG tablet Take 1-2 tablets by mouth in the morning AND 1 tablet daily at 12 noon AND 1 tablet at bedtime. 120 tablet 0   insulin glargine, 1 Unit Dial, (TOUJEO SOLOSTAR) 300 UNIT/ML Solostar Pen Inject 40 Units into the skin daily. 18 mL 3   insulin lispro (HUMALOG KWIKPEN) 200 UNIT/ML KwikPen INJECT 14 UNITS UNDER THE SKIN EVERY MORNING, 14 UNITS WITH LUNCH AND 14 UNITS WITH SUPPER, DAILY 12 mL 5   metFORMIN (GLUCOPHAGE-XR) 500 MG 24 hr tablet Take 2 tablets (1,000 mg total) by mouth 2 (two) times daily. 360 tablet 3   metoprolol tartrate (LOPRESSOR) 25 MG tablet Take 1 tablet (25 mg total) by mouth 2 (two) times daily. 180 tablet 3   pantoprazole (PROTONIX) 40 MG tablet Take 1 tablet (40 mg total) by mouth daily. 90 tablet 3   rosuvastatin (CRESTOR) 20 MG tablet Take 1 tablet (20 mg total) by mouth daily. 90 tablet 3   Tetrahydrozoline HCl (VISINE EXTRA OP) Place 1 drop into both eyes as needed (itching).     umeclidinium-vilanterol (ANORO ELLIPTA) 62.5-25 MCG/ACT AEPB Inhale 1 puff into the lungs daily. 90 each 3   zolpidem (AMBIEN) 10 MG tablet Take 1 tablet (10 mg total) by mouth at bedtime as needed for sleep 30 tablet 2   amLODipine (NORVASC) 5 MG tablet Take 1 tablet (5 mg total) by mouth daily. (Patient not taking: Reported on  02/16/2023) 90 tablet 3   aspirin 81 MG EC tablet Take 1 tablet (81 mg total) by mouth daily. (Patient not taking: Reported on 02/16/2023) 30 tablet 0   Continuous Glucose Receiver (FREESTYLE LIBRE 3 READER) DEVI Use with freestyle libre 3 sensor 1 each 1   Continuous Glucose Sensor (FREESTYLE LIBRE 3 SENSOR) MISC      glucose blood (ONETOUCH VERIO) test strip Use to check blood glucose 3 times daily 300 each 3   Insulin Pen Needle 32G X 4 MM MISC  Use daily with insulin as directed 100 each 3   Iron, Ferrous Sulfate, 325 (65 Fe) MG TABS Take 1 tablet (325 mg) by mouth daily. 30 tablet 3   Lidocaine (ZTLIDO) 1.8 % PTCH Apply 1 patch topically daily. (May wear up to 12hours.) (Patient not taking: Reported on 02/16/2023) 30 patch 2   potassium chloride (KLOR-CON M) 10 MEQ tablet Take 1 tablet (10 mEq total) by mouth daily. (Patient not taking: Reported on 02/16/2023) 90 tablet 3   No current facility-administered medications on file prior to visit.   Past Surgical History:  Procedure Laterality Date   CERVICAL SPINE SURGERY  2012   CORONARY ANGIOPLASTY WITH STENT PLACEMENT  11/23/2011   "1; first one"   CORONARY ARTERY BYPASS GRAFT N/A 01/25/2021   Procedure: CORONARY ARTERY BYPASS GRAFTING (CABG) X2, USING LEFT INTERNAL MAMMARY ARTERY AND LEFT LEG GREATER SAPHENOUS VEIN HARVESTED ENDOSCOPICALLY;  Surgeon: Corliss Skains, MD;  Location: [facility] OR;  Service: Open Heart Surgery;  Laterality: N/A;   ENDOVEIN HARVEST OF GREATER SAPHENOUS VEIN Bilateral 01/25/2021   Procedure: ENDOVEIN HARVEST OF GREATER SAPHENOUS VEIN;  Surgeon: Corliss Skains, MD;  Location: [facility] OR;  Service: Open Heart Surgery;  Laterality: Bilateral;   LEFT HEART CATH AND CORONARY ANGIOGRAPHY N/A 12/28/2020   Procedure: LEFT HEART CATH AND CORONARY ANGIOGRAPHY;  Surgeon: Marykay Lex, MD;  Location: [facility] INVASIVE CV LAB;  Service: Cardiovascular;  Laterality: N/A;   LEFT HEART CATHETERIZATION WITH CORONARY ANGIOGRAM N/A 11/23/2011    Procedure: LEFT HEART CATHETERIZATION WITH CORONARY ANGIOGRAM;  Surgeon: Marykay Lex, MD;  Location[facility] CATH [facility];  Service: Cardiovascular;  Laterality: N/A;   TEE WITHOUT CARDIOVERSION N/A 01/25/2021   Procedure: TRANSESOPHAGEAL ECHOCARDIOGRAM (TEE);  Surgeon: Corliss Skains, MD;  Location: [facility] OR;  Service: Open Heart Surgery;  Laterality: N/A;    The following portions of the patient's history were reviewed and updated as appropriate: allergies, current medications, past family history, past medical history, past social history, past surgical history and problem list.  ROS Otherwise as in subjective above     Objective: BP 122/80   Pulse 68   Wt 186 lb 3.2 oz (84.5 kg)   BMI 30.99 kg/m   Wt Readings from Last 3 Encounters:  02/16/23 186 lb 3.2 oz (84.5 kg)  01/24/23 182 lb (82.6 kg)  01/18/23 182 lb 6.4 oz (82.7 kg)   BP Readings from Last 3 Encounters:  02/16/23 122/80  02/07/23 (!) 142/72  01/24/23 129/74   General appearance: alert, no distress, well developed, well nourished Neck: supple, no lymphadenopathy, no thyromegaly, no masses Heart: RRR, normal S1, S2, no murmurs Lungs: CTA bilaterally, no wheezes, rhonchi, or rales Back nontender today, range of motion limited MSK: Legs nontender, no swelling.  There is a gait abnormality.  When he walks there is an antalgic gait and he almost overlaps his feet when he walks.  He walks with a cane. Left leg definitely weaker than the right leg 4/5 out of 5 strength on the left in general, normal strength on the right DTRs blunted of legs 1+ pedal pulses bilaterally   Assessment: Encounter Diagnoses  Name Primary?   Weakness of both lower extremities Yes   Spinal stenosis of lumbar region, unspecified whether neurogenic claudication present    Chronic pain syndrome    Fall, subsequent encounter    Polypharmacy    Gait abnormality       Plan: We discussed his symptoms and concerns.  He has  chronic pain,  sees chronic pain clinic.  I have reviewed imaging in the chart.  Last lumbar spine x-ray July 2024 reviewed.  Last MRI of lumbar spine 2018 also reviewed showing some severe bilateral foraminal stenosis.  We do not have any records in the chart from pain management.  We will have him sign to get records from [facility].  Referral today for physical therapy to help with weakness, stretching, helping manage pain and given the recent falls discussing fall prevention  Referral placed today for pharmacist consult for chronic disease management given his polypharmacy and medication burden.  I reviewed over his labs from January 11, 2023.  We discussed the findings.   Goldie was seen today for trouble walking.  Diagnoses and all orders for this visit:  Weakness of both lower extremities -     Ambulatory referral to Physical Therapy  Spinal stenosis of lumbar region, unspecified whether neurogenic claudication present -     Ambulatory referral to Physical Therapy  Chronic pain syndrome -     Ambulatory referral to Physical Therapy  Fall, subsequent encounter -     Ambulatory referral to Physical Therapy  Polypharmacy  Gait abnormality    Follow up: 3-4 weeks post PT

## 2023-02-16 NOTE — Progress Notes (Signed)
It actually let me place the order! It should just pop up when you type [id number], so not sure what happened.

## 2023-02-16 NOTE — Progress Notes (Signed)
A 2304-pharmacy referral will get them on my schedule!

## 2023-02-16 NOTE — Telephone Encounter (Signed)
Request by provider to place a referral to pharmacy. Sent to provider for cosign.  Sherrill Raring, PharmD Clinical Pharmacist [phone]

## 2023-02-20 ENCOUNTER — Other Ambulatory Visit (HOSPITAL_BASED_OUTPATIENT_CLINIC_OR_DEPARTMENT_OTHER): Payer: Self-pay | Admitting: Nurse Practitioner

## 2023-02-20 ENCOUNTER — Other Ambulatory Visit: Payer: Medicare Other

## 2023-02-20 DIAGNOSIS — E1142 Type 2 diabetes mellitus with diabetic polyneuropathy: Secondary | ICD-10-CM

## 2023-02-21 ENCOUNTER — Other Ambulatory Visit: Payer: Medicare Other

## 2023-02-21 ENCOUNTER — Telehealth: Payer: Self-pay

## 2023-02-21 NOTE — Progress Notes (Signed)
   Care Guide Note  02/21/2023 Name: Paul [name] MRN: [id number] DOB: 08-Jul-1950  Referred by: Tollie Eth, NP Reason for referral : Care Coordination (Outreach to schedule with Pharm d )   Paul [name] is a 72 y.o. year old male who is a primary care patient of Early, Sung Amabile, NP. Paul [name] was referred to the pharmacist for assistance related to DM.    Successful contact was made with the patient to discuss pharmacy services including being ready for the pharmacist to call at least 5 minutes before the scheduled appointment time, to have medication bottles and any blood sugar or blood pressure readings ready for review. The patient agreed to meet with the pharmacist via with the pharmacist via telephone visit on (date/time).  02/22/2023  Penne Lash, RMA Care Guide [facility]  [place], [place] [address] Direct Dial: [phone] [email or web address]

## 2023-02-22 ENCOUNTER — Other Ambulatory Visit: Payer: Medicare Other

## 2023-02-22 NOTE — Progress Notes (Deleted)
02/22/2023 Name: Kaenan Jake MRN: [id number] DOB: 08/02/1950  No chief complaint on file.   Cid Agena is a 72 y.o. year old male who presented for a telephone visit.   They were referred to the pharmacist by their PCP for assistance in managing complex medication management.    Subjective:  Care Team: Primary Care Provider: Tollie Eth, NP ; Next Scheduled Visit: 03/14/23  Medication Access/Adherence  Current Pharmacy:  [facility] [address] [place] [address] Phone: [phone] Fax: [phone]  [facility] - [place], [place] - [address] [place] [address] Phone: [phone] Fax: [phone]   Patient reports affordability concerns with their medications: {YES/NO:21197} Patient reports access/transportation concerns to their pharmacy: {YES/NO:21197} Patient reports adherence concerns with their medications:  {YES/NO:21197} ***   Diabetes:  Current medications: Humalog 14 units TID ac, Toujeo 40 units, Metformin XR 1000mg  BID Medications tried in the past: Glipizide  Current glucose readings: *** Using *** meter; testing *** times daily  Date of Download: *** % Time CGM is active: ***% Average Glucose: *** mg/dL Glucose Management Indicator: ***  Glucose Variability: *** (goal <36%) Time in Goal:  - Time in range 70-180: ***% - Time above range: ***% - Time below range: ***% Observed patterns:  Patient {Actions; denies-reports:120008} hypoglycemic s/sx including ***dizziness, shakiness, sweating. Patient {Actions; denies-reports:120008} hyperglycemic symptoms including ***polyuria, polydipsia, polyphagia, nocturia, neuropathy, blurred vision.  Current meal patterns:  - Breakfast: *** - Lunch *** - Supper *** - Snacks *** - Drinks ***  Current physical activity: ***  Current medication access support: ***  Hypertension:  Current medications:  Amlodipine 5mg , Metoprolol tartrate 25mg  Medications previously tried: Lasix  Patient {HAS/DOES NOT DGLO:75643} a validated, automated, upper arm home BP cuff Current blood pressure readings readings: ***  Patient {Actions; denies-reports:120008} hypotensive s/sx including ***dizziness, lightheadedness.  Patient {Actions; denies-reports:120008} hypertensive symptoms including ***headache, chest pain, shortness of breath  Current meal patterns: ***  Current physical activity: ***   Hyperlipidemia/ASCVD Risk Reduction  Current lipid lowering medications: Rosuvastatin 20mg  Medications tried in the past: Atorvastatin  Antiplatelet regimen: Aspirin 81mg    Current physical activity: ***  Current medication access support: ***    Heart Failure (EF 65-70%):  Current medications:  ACEi/ARB/ARNI: None SGLT2i: None Beta blocker: Metoprolol tartrate 25mg  BID Mineralocorticoid Receptor Antagonist: None Diuretic regimen: None  Current home blood pressure readings: *** Current home weights: ***  Patient {Actions; denies-reports:120008} volume overload signs or symptoms including ***shortness of breath, lower extremity edema, increased use of pillows at night   Current physical activity: ***  Current medication access support: ***  COPD:  Current medications: Ventolin HFA prn, Anoro Ellipta Medications tried in the past: Stiolto, Spiriva, Brovana, Duoneb, Advair  Reports 1 exacerbations in the past year  Current medication access support: ***  Depression/Anxiety/Insomnia :  Current medications: Cymbalta 20mg , Ambien 10mg  Medications tried in the past: Xanax, Valium  Behavioral Health support: ***  Current medication access support: ***  Chronic Pain: Current Medications: Gabapentin, Cymbalta, Norco, Tylenol   Objective:  Lab Results  Component Value Date   HGBA1C 7.1 (H) 01/11/2023    Lab Results  Component Value Date   CREATININE 1.03 01/11/2023   BUN  18 01/11/2023   NA 139 01/11/2023   K 4.9 01/11/2023   CL 106 01/11/2023   CO2 17 (L) 01/11/2023    Lab Results  Component Value Date   CHOL 76 (  L) 01/11/2023   HDL 23 (L) 01/11/2023   LDLCALC 15 01/11/2023   TRIG 255 (H) 01/11/2023   CHOLHDL 3.3 01/11/2023    Medications Reviewed Today   Medications were not reviewed in this encounter       Assessment/Plan:   Diabetes: - Currently {CHL Controlled/[phone]} - Reviewed long term cardiovascular and renal outcomes of uncontrolled blood sugar - Reviewed goal A1c, goal fasting, and goal 2 hour post prandial glucose - Reviewed dietary modifications including *** - Reviewed lifestyle modifications including: - Recommend to ***  - Recommend to check glucose *** - Meets financial criteria for *** patient assistance program through ***. Will collaborate with provider, CPhT, and patient to pursue assistance.      Hyperlipidemia/ASCVD Risk Reduction: - Currently {CHL Controlled/[phone]}.  - Reviewed long term complications of uncontrolled cholesterol - Reviewed dietary recommendations including *** - Reviewed lifestyle recommendations including *** - Recommend to ***  - Meets financial criteria for *** patient assistance program through ***. Will collaborate with provider, CPhT, and patient to pursue assistance.     Heart Failure: - Currently {managed:26422} - Reviewed appropriate blood pressure monitoring technique and reviewed goal blood pressure - Reviewed to weigh daily and when to contact cardiology with weight gain - Reviewed dietary modifications including *** - Recommend to ***  - Meets financial criteria for *** patient assistance program through ***. Will collaborate with provider, CPhT, and patient to pursue assistance.     COPD: - Currently {CHL Controlled/[phone]}.  - Reviewed appropriate inhaler technique. - Recommend to ***  - Meets financial criteria for  *** patient assistance program through ***. Will collaborate with provider, CPhT, and patient to pursue assistance.     Chronic Pain  Follow Up Plan: ***  Sherrill Raring, PharmD Clinical Pharmacist [phone]

## 2023-02-26 ENCOUNTER — Other Ambulatory Visit: Payer: Self-pay

## 2023-02-26 ENCOUNTER — Encounter (HOSPITAL_BASED_OUTPATIENT_CLINIC_OR_DEPARTMENT_OTHER): Payer: Self-pay | Admitting: Plastic Surgery

## 2023-02-27 ENCOUNTER — Encounter (HOSPITAL_BASED_OUTPATIENT_CLINIC_OR_DEPARTMENT_OTHER)
Admission: RE | Admit: 2023-02-27 | Discharge: 2023-02-27 | Disposition: A | Discharge status: Home / Self Care | Payer: Medicare Other | Source: Ambulatory Visit | Attending: Plastic Surgery | Admitting: Plastic Surgery

## 2023-02-27 DIAGNOSIS — Z01812 Encounter for preprocedural laboratory examination: Secondary | ICD-10-CM | POA: Diagnosis not present

## 2023-02-27 LAB — BASIC METABOLIC PANEL
Anion gap: 10 (ref 5–15)
BUN: 20 mg/dL (ref 8–23)
CO2: 18 mmol/L — ABNORMAL LOW (ref 22–32)
Calcium: 9.8 mg/dL (ref 8.9–10.3)
Chloride: 109 mmol/L (ref 98–111)
Creatinine, Ser: 0.91 mg/dL (ref 0.61–1.24)
GFR, Estimated: >60 mL/min (ref >60)
Glucose, Bld: 188 mg/dL — ABNORMAL HIGH (ref 70–99)
Potassium: 5 mmol/L (ref 3.5–5.1)
Sodium: 137 mmol/L (ref 135–145)

## 2023-02-27 MED ORDER — CHLORHEXIDINE GLUCONATE CLOTH 2 % EX PADS: 6.0000 | MEDICATED_PAD | Freq: Once | CUTANEOUS | Status: DC

## 2023-02-27 NOTE — Progress Notes (Signed)

## 2023-02-27 NOTE — Progress Notes (Signed)
Placed referral  

## 2023-02-27 NOTE — Addendum Note (Signed)
Addended by: Herminio Commons A on: 02/27/2023 03:46 PM   Modules accepted: Orders

## 2023-02-28 ENCOUNTER — Other Ambulatory Visit (HOSPITAL_COMMUNITY): Payer: Self-pay

## 2023-02-28 DIAGNOSIS — M545 Low back pain, unspecified: Secondary | ICD-10-CM | POA: Diagnosis not present

## 2023-02-28 DIAGNOSIS — M6281 Muscle weakness (generalized): Secondary | ICD-10-CM | POA: Diagnosis not present

## 2023-02-28 DIAGNOSIS — R262 Difficulty in walking, not elsewhere classified: Secondary | ICD-10-CM | POA: Diagnosis not present

## 2023-02-28 DIAGNOSIS — R296 Repeated falls: Secondary | ICD-10-CM | POA: Diagnosis not present

## 2023-03-01 ENCOUNTER — Other Ambulatory Visit (HOSPITAL_COMMUNITY): Payer: Self-pay

## 2023-03-02 DIAGNOSIS — R296 Repeated falls: Secondary | ICD-10-CM | POA: Diagnosis not present

## 2023-03-02 DIAGNOSIS — R2681 Unsteadiness on feet: Secondary | ICD-10-CM | POA: Diagnosis not present

## 2023-03-02 DIAGNOSIS — M545 Low back pain, unspecified: Secondary | ICD-10-CM | POA: Diagnosis not present

## 2023-03-02 DIAGNOSIS — M6281 Muscle weakness (generalized): Secondary | ICD-10-CM | POA: Diagnosis not present

## 2023-03-05 DIAGNOSIS — M545 Low back pain, unspecified: Secondary | ICD-10-CM | POA: Diagnosis not present

## 2023-03-05 DIAGNOSIS — R2681 Unsteadiness on feet: Secondary | ICD-10-CM | POA: Diagnosis not present

## 2023-03-05 DIAGNOSIS — R296 Repeated falls: Secondary | ICD-10-CM | POA: Diagnosis not present

## 2023-03-05 DIAGNOSIS — M6281 Muscle weakness (generalized): Secondary | ICD-10-CM | POA: Diagnosis not present

## 2023-03-06 ENCOUNTER — Other Ambulatory Visit: Payer: Self-pay

## 2023-03-06 ENCOUNTER — Ambulatory Visit (HOSPITAL_BASED_OUTPATIENT_CLINIC_OR_DEPARTMENT_OTHER): Payer: Medicare Other | Admitting: Anesthesiology

## 2023-03-06 ENCOUNTER — Ambulatory Visit (HOSPITAL_BASED_OUTPATIENT_CLINIC_OR_DEPARTMENT_OTHER)
Admission: RE | Admit: 2023-03-06 | Discharge: 2023-03-06 | Disposition: A | Discharge status: Home / Self Care | Payer: Medicare Other | Attending: Plastic Surgery | Admitting: Plastic Surgery

## 2023-03-06 ENCOUNTER — Encounter (HOSPITAL_BASED_OUTPATIENT_CLINIC_OR_DEPARTMENT_OTHER)
Admission: RE | Disposition: A | Discharge status: Home / Self Care | Payer: Self-pay | Source: Home / Self Care | Attending: Plastic Surgery

## 2023-03-06 ENCOUNTER — Encounter (HOSPITAL_BASED_OUTPATIENT_CLINIC_OR_DEPARTMENT_OTHER): Payer: Self-pay | Admitting: Plastic Surgery

## 2023-03-06 ENCOUNTER — Ambulatory Visit: Payer: Medicare Other | Admitting: Physical Therapy

## 2023-03-06 DIAGNOSIS — Z01818 Encounter for other preprocedural examination: Secondary | ICD-10-CM

## 2023-03-06 DIAGNOSIS — N183 Chronic kidney disease, stage 3 unspecified: Secondary | ICD-10-CM | POA: Diagnosis not present

## 2023-03-06 DIAGNOSIS — I130 Hypertensive heart and chronic kidney disease with heart failure and stage 1 through stage 4 chronic kidney disease, or unspecified chronic kidney disease: Secondary | ICD-10-CM | POA: Insufficient documentation

## 2023-03-06 DIAGNOSIS — G4733 Obstructive sleep apnea (adult) (pediatric): Secondary | ICD-10-CM | POA: Insufficient documentation

## 2023-03-06 DIAGNOSIS — C44629 Squamous cell carcinoma of skin of left upper limb, including shoulder: Secondary | ICD-10-CM | POA: Diagnosis not present

## 2023-03-06 DIAGNOSIS — D696 Thrombocytopenia, unspecified: Secondary | ICD-10-CM | POA: Insufficient documentation

## 2023-03-06 DIAGNOSIS — Z7982 Long term (current) use of aspirin: Secondary | ICD-10-CM | POA: Insufficient documentation

## 2023-03-06 DIAGNOSIS — C4912 Malignant neoplasm of connective and soft tissue of left upper limb, including shoulder: Secondary | ICD-10-CM | POA: Diagnosis not present

## 2023-03-06 DIAGNOSIS — Z951 Presence of aortocoronary bypass graft: Secondary | ICD-10-CM | POA: Diagnosis not present

## 2023-03-06 DIAGNOSIS — L989 Disorder of the skin and subcutaneous tissue, unspecified: Secondary | ICD-10-CM | POA: Diagnosis not present

## 2023-03-06 DIAGNOSIS — Z791 Long term (current) use of non-steroidal anti-inflammatories (NSAID): Secondary | ICD-10-CM | POA: Insufficient documentation

## 2023-03-06 DIAGNOSIS — E1151 Type 2 diabetes mellitus with diabetic peripheral angiopathy without gangrene: Secondary | ICD-10-CM | POA: Insufficient documentation

## 2023-03-06 DIAGNOSIS — J449 Chronic obstructive pulmonary disease, unspecified: Secondary | ICD-10-CM | POA: Insufficient documentation

## 2023-03-06 DIAGNOSIS — E1122 Type 2 diabetes mellitus with diabetic chronic kidney disease: Secondary | ICD-10-CM | POA: Diagnosis not present

## 2023-03-06 DIAGNOSIS — Z7984 Long term (current) use of oral hypoglycemic drugs: Secondary | ICD-10-CM | POA: Diagnosis not present

## 2023-03-06 DIAGNOSIS — E119 Type 2 diabetes mellitus without complications: Secondary | ICD-10-CM | POA: Diagnosis not present

## 2023-03-06 DIAGNOSIS — D485 Neoplasm of uncertain behavior of skin: Secondary | ICD-10-CM | POA: Diagnosis not present

## 2023-03-06 DIAGNOSIS — D489 Neoplasm of uncertain behavior, unspecified: Secondary | ICD-10-CM | POA: Diagnosis present

## 2023-03-06 DIAGNOSIS — Z794 Long term (current) use of insulin: Secondary | ICD-10-CM | POA: Diagnosis not present

## 2023-03-06 DIAGNOSIS — I2510 Atherosclerotic heart disease of native coronary artery without angina pectoris: Secondary | ICD-10-CM | POA: Insufficient documentation

## 2023-03-06 HISTORY — PX: LESION REMOVAL: SHX5196

## 2023-03-06 LAB — GLUCOSE, CAPILLARY
Glucose-Capillary: 154 mg/dL — ABNORMAL HIGH (ref 70–99)
Glucose-Capillary: 139 mg/dL — ABNORMAL HIGH (ref 70–99)

## 2023-03-06 SURGERY — WIDE EXCISION, LESION, UPPER EXTREMITY
Anesthesia: General | Site: Hand | Laterality: Left

## 2023-03-06 MED ORDER — PHENYLEPHRINE 80 MCG/ML (10ML) SYRINGE FOR IV PUSH (FOR BLOOD PRESSURE SUPPORT)
PREFILLED_SYRINGE | INTRAVENOUS | Status: AC
  Filled 2023-03-06: qty 10

## 2023-03-06 MED ORDER — ONDANSETRON HCL 4 MG/2ML IJ SOLN
INTRAMUSCULAR | Status: DC | PRN
  Administered 2023-03-06: 4 mg via INTRAVENOUS

## 2023-03-06 MED ORDER — FENTANYL CITRATE (PF) 100 MCG/2ML IJ SOLN
INTRAMUSCULAR | Status: AC
  Filled 2023-03-06: qty 2

## 2023-03-06 MED ORDER — CEFAZOLIN SODIUM-DEXTROSE 2-4 GM/100ML-% IV SOLN
INTRAVENOUS | Status: AC
  Filled 2023-03-06: qty 100

## 2023-03-06 MED ORDER — ROCURONIUM BROMIDE 10 MG/ML (PF) SYRINGE
PREFILLED_SYRINGE | INTRAVENOUS | Status: AC
  Filled 2023-03-06: qty 10

## 2023-03-06 MED ORDER — ONDANSETRON HCL 4 MG/2ML IJ SOLN
INTRAMUSCULAR | Status: AC
  Filled 2023-03-06: qty 2

## 2023-03-06 MED ORDER — PHENYLEPHRINE HCL-NACL 20-0.9 MG/250ML-% IV SOLN
INTRAVENOUS | Status: DC | PRN
  Administered 2023-03-06: 50 ug/min via INTRAVENOUS

## 2023-03-06 MED ORDER — OXYCODONE HCL 5 MG/5ML PO SOLN: 5.0000 mg | Freq: Once | ORAL | Status: DC | PRN

## 2023-03-06 MED ORDER — SUCCINYLCHOLINE CHLORIDE 200 MG/10ML IV SOSY
PREFILLED_SYRINGE | INTRAVENOUS | Status: AC
  Filled 2023-03-06: qty 10

## 2023-03-06 MED ORDER — FENTANYL CITRATE (PF) 100 MCG/2ML IJ SOLN
INTRAMUSCULAR | Status: DC | PRN
  Administered 2023-03-06: 25 ug via INTRAVENOUS

## 2023-03-06 MED ORDER — EPHEDRINE 5 MG/ML INJ
INTRAVENOUS | Status: AC
  Filled 2023-03-06: qty 5

## 2023-03-06 MED ORDER — MIDAZOLAM HCL 2 MG/2ML IJ SOLN
INTRAMUSCULAR | Status: AC
  Filled 2023-03-06: qty 2

## 2023-03-06 MED ORDER — ONDANSETRON HCL 4 MG/2ML IJ SOLN: 4.0000 mg | Freq: Four times a day (QID) | INTRAMUSCULAR | Status: DC | PRN

## 2023-03-06 MED ORDER — LACTATED RINGERS IV SOLN
INTRAVENOUS | Status: DC
Start: 2023-03-06 — End: 2023-03-06

## 2023-03-06 MED ORDER — LIDOCAINE 2% (20 MG/ML) 5 ML SYRINGE
INTRAMUSCULAR | Status: AC
  Filled 2023-03-06: qty 5

## 2023-03-06 MED ORDER — EPHEDRINE SULFATE (PRESSORS) 50 MG/ML IJ SOLN
INTRAMUSCULAR | Status: DC | PRN
  Administered 2023-03-06 (×2): 10 mg via INTRAVENOUS
  Administered 2023-03-06: 5 mg via INTRAVENOUS

## 2023-03-06 MED ORDER — OXYCODONE HCL 5 MG PO TABS: 5.0000 mg | ORAL_TABLET | Freq: Once | ORAL | Status: DC | PRN

## 2023-03-06 MED ORDER — CEFAZOLIN SODIUM-DEXTROSE 2-4 GM/100ML-% IV SOLN
2.0000 g | INTRAVENOUS | Status: AC
  Administered 2023-03-06: 2 g via INTRAVENOUS

## 2023-03-06 MED ORDER — PROPOFOL 10 MG/ML IV BOLUS
INTRAVENOUS | Status: DC | PRN
  Administered 2023-03-06: 150 mg via INTRAVENOUS

## 2023-03-06 MED ORDER — FENTANYL CITRATE (PF) 100 MCG/2ML IJ SOLN
25.0000 ug | INTRAMUSCULAR | Status: DC | PRN
  Administered 2023-03-06: 50 ug via INTRAVENOUS

## 2023-03-06 MED ORDER — LIDOCAINE 2% (20 MG/ML) 5 ML SYRINGE
INTRAMUSCULAR | Status: DC | PRN
  Administered 2023-03-06: 60 mg via INTRAVENOUS

## 2023-03-06 MED ORDER — SODIUM CHLORIDE 0.9 % IV SOLN: INTRAVENOUS | Status: DC | PRN

## 2023-03-06 SURGICAL SUPPLY — 80 items
BLADE CLIPPER SURG (BLADE) IMPLANT
BLADE HEX COATED 2.75 (ELECTRODE) IMPLANT
BLADE SURG 15 STRL LF DISP TIS (BLADE) ×2 IMPLANT
BLADE SURG 15 STRL SS (BLADE) ×2
BNDG COHESIVE 4X5 TAN STRL LF (GAUZE/BANDAGES/DRESSINGS) IMPLANT
BNDG ELASTIC 2INX 5YD STR LF (GAUZE/BANDAGES/DRESSINGS) ×1 IMPLANT
BNDG GAUZE DERMACEA FLUFF 4 (GAUZE/BANDAGES/DRESSINGS) ×1 IMPLANT
CANISTER SUCT 1200ML W/VALVE (MISCELLANEOUS) ×1 IMPLANT
CORD BIPOLAR FORCEPS 12FT (ELECTRODE) ×1 IMPLANT
COTTONBALL LRG STERILE PKG (GAUZE/BANDAGES/DRESSINGS) IMPLANT
COVER BACK TABLE 60X90IN (DRAPES) ×2 IMPLANT
COVER MAYO STAND STRL (DRAPES) ×2 IMPLANT
DERMABOND ADVANCED .7 DNX12 (GAUZE/BANDAGES/DRESSINGS) IMPLANT
DRAPE EXTREMITY T 121X128X90 (DISPOSABLE) ×1 IMPLANT
DRAPE LAPAROTOMY 100X72 PEDS (DRAPES) IMPLANT
DRAPE U-SHAPE 76X120 STRL (DRAPES) IMPLANT
DRSG CUTIMED SORBACT 7X9 (GAUZE/BANDAGES/DRESSINGS) ×1 IMPLANT
DRSG MEPILEX POST OP 4X8 (GAUZE/BANDAGES/DRESSINGS) IMPLANT
DRSG TEGADERM 2-3/8X2-3/4 SM (GAUZE/BANDAGES/DRESSINGS) IMPLANT
ELECT NDL BLADE 2-5/6 (NEEDLE) ×1 IMPLANT
ELECT NEEDLE BLADE 2-5/6 (NEEDLE)
ELECT REM PT RETURN 9FT ADLT (ELECTROSURGICAL) ×2
ELECT REM PT RETURN 9FT PED (ELECTROSURGICAL)
ELECTRODE REM PT RETRN 9FT PED (ELECTROSURGICAL) IMPLANT
ELECTRODE REM PT RTRN 9FT ADLT (ELECTROSURGICAL) IMPLANT
GAUZE SPONGE 2X2 STRL 8-PLY (GAUZE/BANDAGES/DRESSINGS) IMPLANT
GAUZE SPONGE 4X4 12PLY STRL LF (GAUZE/BANDAGES/DRESSINGS) IMPLANT
GAUZE STRETCH 2X75IN STRL (MISCELLANEOUS) IMPLANT
GAUZE XEROFORM 1X8 LF (GAUZE/BANDAGES/DRESSINGS) IMPLANT
GAUZE XEROFORM 5X9 LF (GAUZE/BANDAGES/DRESSINGS) IMPLANT
GLOVE BIO SURGEON STRL SZ 6.5 (GLOVE) ×3 IMPLANT
GLOVE BIO SURGEON STRL SZ7.5 (GLOVE) ×2 IMPLANT
GLOVE BIO SURGEON STRL SZ8 (GLOVE) ×2 IMPLANT
GLOVE BIOGEL M 8.0 STRL (GLOVE) ×1 IMPLANT
GLOVE BIOGEL PI IND STRL 8 (GLOVE) ×2 IMPLANT
GLOVE SURG SS PI 6.5 STRL IVOR (GLOVE) ×1 IMPLANT
GOWN STRL REUS W/ TWL LRG LVL3 (GOWN DISPOSABLE) ×4 IMPLANT
GOWN STRL REUS W/ TWL XL LVL3 (GOWN DISPOSABLE) ×1 IMPLANT
GOWN STRL REUS W/TWL LRG LVL3 (GOWN DISPOSABLE) ×4
GOWN STRL REUS W/TWL XL LVL3 (GOWN DISPOSABLE) ×4 IMPLANT
GRAFT MYRIAD 3 LAYER 5X5 (Graft) ×1 IMPLANT
NDL HYPO 30GX1 BEV (NEEDLE) ×1 IMPLANT
NDL PRECISIONGLIDE 27X1.5 (NEEDLE) ×1 IMPLANT
NEEDLE HYPO 30GX1 BEV (NEEDLE)
NEEDLE PRECISIONGLIDE 27X1.5 (NEEDLE)
NS IRRIG 1000ML POUR BTL (IV SOLUTION) ×1 IMPLANT
PACK BASIN DAY SURGERY FS (CUSTOM PROCEDURE TRAY) ×2 IMPLANT
PACK UNIVERSAL I (CUSTOM PROCEDURE TRAY) IMPLANT
PENCIL SMOKE EVACUATOR (MISCELLANEOUS) ×2 IMPLANT
SHEET MEDIUM DRAPE 40X70 STRL (DRAPES) ×1 IMPLANT
SPIKE FLUID TRANSFER (MISCELLANEOUS) ×1 IMPLANT
SPONGE T-LAP 18X18 ~~LOC~~+RFID (SPONGE) IMPLANT
STOCKINETTE 4X48 STRL (DRAPES) ×1 IMPLANT
STRIP CLOSURE SKIN 1/2X4 (GAUZE/BANDAGES/DRESSINGS) IMPLANT
SUCTION TUBE FRAZIER 10FR DISP (SUCTIONS) ×1 IMPLANT
SUT CHROMIC 4 0 P 3 18 (SUTURE) IMPLANT
SUT ETHILON 4 0 PS 2 18 (SUTURE) IMPLANT
SUT MNCRL 6-0 UNDY P1 1X18 (SUTURE) ×2 IMPLANT
SUT MNCRL AB 3-0 PS2 27 (SUTURE) IMPLANT
SUT MNCRL AB 4-0 PS2 18 (SUTURE) ×2 IMPLANT
SUT MON AB 5-0 P3 18 (SUTURE) IMPLANT
SUT MON AB 5-0 PS2 18 (SUTURE) IMPLANT
SUT NYLON ETHILON 5-0 P-3 1X18 (SUTURE) IMPLANT
SUT PROLENE 4 0 PS 2 18 (SUTURE) ×3 IMPLANT
SUT PROLENE 5 0 P 3 (SUTURE) IMPLANT
SUT PROLENE 5 0 PS 2 (SUTURE) IMPLANT
SUT PROLENE 6 0 P 1 18 (SUTURE) IMPLANT
SUT SILK 2 0 SH (SUTURE) IMPLANT
SUT SILK 3 0 SH 30 (SUTURE) ×1 IMPLANT
SUT VIC AB 3-0 SH 27X BRD (SUTURE) IMPLANT
SUT VIC AB 4-0 PS2 18 (SUTURE) IMPLANT
SUT VIC AB 5-0 P-3 18X BRD (SUTURE) IMPLANT
SUT VIC AB 5-0 PS2 18 (SUTURE) IMPLANT
SUT VICRYL RAPIDE 4-0 (SUTURE) IMPLANT
SYR BULB EAR ULCER 3OZ GRN STR (SYRINGE) IMPLANT
SYR CONTROL 10ML LL (SYRINGE) ×2 IMPLANT
TAPE STRIPS DRAPE STRL (GAUZE/BANDAGES/DRESSINGS) IMPLANT
TOWEL GREEN STERILE FF (TOWEL DISPOSABLE) ×2 IMPLANT
TRAY DSU PREP LF (CUSTOM PROCEDURE TRAY) ×2 IMPLANT
TUBE CONNECTING 20X1/4 (TUBING) ×1 IMPLANT

## 2023-03-06 NOTE — Anesthesia Preprocedure Evaluation (Signed)
Anesthesia Evaluation  Patient identified by MRN, date of birth, ID band Patient awake    Reviewed: Allergy & Precautions, H&P , NPO status , Patient's Chart, lab work & pertinent test results  Airway Mallampati: II   Neck ROM: full    Dental   Pulmonary sleep apnea , COPD, former smoker   breath sounds clear to auscultation       Cardiovascular hypertension, + CAD, + CABG and + Peripheral Vascular Disease   Rhythm:regular Rate:Normal     Neuro/Psych  PSYCHIATRIC DISORDERS Anxiety Depression     Neuromuscular disease    GI/Hepatic ,GERD  ,,  Endo/Other  diabetes, Type 2    Renal/GU      Musculoskeletal  (+) Arthritis ,    Abdominal   Peds  Hematology   Anesthesia Other Findings   Reproductive/Obstetrics                             Anesthesia Physical Anesthesia Plan  ASA: 3  Anesthesia Plan: General   Post-op Pain Management:    Induction: Intravenous  PONV Risk Score and Plan: 2 and Ondansetron, Dexamethasone and Treatment may vary due to age or medical condition  Airway Management Planned: LMA  Additional Equipment:   Intra-op Plan:   Post-operative Plan: Extubation in OR  Informed Consent: I have reviewed the patients History and Physical, chart, labs and discussed the procedure including the risks, benefits and alternatives for the proposed anesthesia with the patient or authorized representative who has indicated his/her understanding and acceptance.     Dental advisory given  Plan Discussed with: CRNA, Anesthesiologist and Surgeon  Anesthesia Plan Comments:        Anesthesia Quick Evaluation

## 2023-03-06 NOTE — Anesthesia Procedure Notes (Signed)
Procedure Name: LMA Insertion Date/Time: 03/06/2023 10:53 AM  Performed by: Ronnette Hila, CRNAPre-anesthesia Checklist: Patient identified, Emergency Drugs available, Suction available and Patient being monitored Patient Re-evaluated:Patient Re-evaluated prior to induction Oxygen Delivery Method: Circle system utilized Preoxygenation: Pre-oxygenation with 100% oxygen Induction Type: IV induction Ventilation: Mask ventilation without difficulty LMA: LMA inserted LMA Size: 5.0 Number of attempts: 1 Airway Equipment and Method: Bite block Placement Confirmation: positive ETCO2 Tube secured with: Tape Dental Injury: Teeth and Oropharynx as per pre-operative assessment

## 2023-03-06 NOTE — Discharge Instructions (Addendum)
Activity as tolerated. Do not get your arm wet, you can shower/bathe, but avoid water to the left arm/surgical site. NO driving for 24 hours or while taking narcotic medications. No heavy activities  Diet: Regular, Try to optimize nutrition with plenty of fruits and vegetables to improve healing. Wound Care: Keep dressing clean & dry.  Do not change dressings until seen in our office. We would like you to come Thursday 03/08/23.  Special Instructions: Call doctor if any unusual problems occur such as pain, excessive bleeding, unrelieved nausea/vomiting, fever &/or chills  Follow-up appointment: Office will call you to schedule an appointment on 03/08/23 (Thursday) to evaluate your wound and discuss dressing changes. Medications: Called in at your pre-op appointment.  Post Anesthesia Home Care Instructions  Activity: Get plenty of rest for the remainder of the day. A responsible individual must stay with you for 24 hours following the procedure.  For the next 24 hours, DO NOT: -Drive a car -Advertising copywriter -Drink alcoholic beverages -Take any medication unless instructed by your physician -Make any legal decisions or sign important papers.  Meals: Start with liquid foods such as gelatin or soup. Progress to regular foods as tolerated. Avoid greasy, spicy, heavy foods. If nausea and/or vomiting occur, drink only clear liquids until the nausea and/or vomiting subsides. Call your physician if vomiting continues.  Special Instructions/Symptoms: Your throat may feel dry or sore from the anesthesia or the breathing tube placed in your throat during surgery. If this causes discomfort, gargle with warm salt water. The discomfort should disappear within 24 hours.  If you had a scopolamine patch placed behind your ear for the management of post- operative nausea and/or vomiting:  1. The medication in the patch is effective for 72 hours, after which it should be removed.  Wrap patch in a  tissue and discard in the trash. Wash hands thoroughly with soap and water. 2. You may remove the patch earlier than 72 hours if you experience unpleasant side effects which may include dry mouth, dizziness or visual disturbances. 3. Avoid touching the patch. Wash your hands with soap and water after contact with the patch.

## 2023-03-06 NOTE — Transfer of Care (Signed)
Immediate Anesthesia Transfer of Care Note  Patient: Paul [name]  Procedure(s) Performed: Excision of skin lesion, left hand (Left: Hand) myriad placement (Left: Hand)  Patient Location: PACU  Anesthesia Type:General  Level of Consciousness: sedated  Airway & Oxygen Therapy: Patient Spontanous Breathing and Patient connected to face mask oxygen  Post-op Assessment: Report given to RN and Post -op Vital signs reviewed and stable  Post vital signs: Reviewed and stable  Last Vitals:  Vitals Value Taken Time  BP    Temp    Pulse    Resp    SpO2      Last Pain:  Vitals:   03/06/23 0948  TempSrc: Oral  PainSc: 0-No pain      Patients Stated Pain Goal: 4 (03/06/23 0948)  Complications: No notable events documented.

## 2023-03-06 NOTE — Interval H&P Note (Signed)
History and Physical Interval Note: No change in in exam or indication for surgery. All questions answered. Site marked with his concurrence. Will excise the lesion on his left hand at his request.  03/06/2023 10:14 AM  Paul [name]  has presented today for surgery, with the diagnosis of Neoplasm, uncertain whether benign or malignant.  The various methods of treatment have been discussed with the patient and family. After consideration of risks, benefits and other options for treatment, the patient has consented to  Procedure(s): Excision of skin lesion, left hand (Bilateral) Possible skin graft. (Left) Possible myriad (Left) Possible local tissue rearrangement (Left) as a surgical intervention.  The patient's history has been reviewed, patient examined, no change in status, stable for surgery.  I have reviewed the patient's chart and labs.  Questions were answered to the patient's satisfaction.     Santiago Glad

## 2023-03-06 NOTE — Op Note (Signed)
DATE OF OPERATION: 03/06/2023  LOCATION: [facility] operating Room  PREOPERATIVE DIAGNOSIS: Left hand skin lesion  POSTOPERATIVE DIAGNOSIS: Same  PROCEDURE: Excision of skin lesion with placement of myriad tissue replacement matrix  SURGEON: Loren Racer, MD  ASSISTANT: Caroline More, primary, Matt Scheeler, Filomena Jungling medical student  EBL: 10 cc  CONDITION: Stable  COMPLICATIONS: None  INDICATION: The patient, Paul [name], is a 72 y.o. male born on 1950/06/16, is here for treatment of a skin lesion on the dorsum of the left hand.  The lesion is removed for pathologic diagnosis  PROCEDURE DETAILS:  The patient was seen prior to surgery and marked.   IV antibiotics were given. The patient was taken to the operating room and given a general anesthetic. A standard time out was performed and all information was confirmed by those in the room. SCDs were placed.   The left hand was prepped and draped in usual sterile manner.  An elliptical incision was made around the skin lesion with approximately a 2 mm margin.  The specimen was marked with sutures for orientation and sent to pathology for routine examination.  On inspection of the wound I did not believe that I could mobilize enough tissue on the back of the hand for primary closure.  Given the fact that we do not have a pathologic diagnosis I did not want to rotate a skin flap or to place a skin graft so I elected to cover the wound with myriad tissue replacement matrix.  The wound measured 2 cm x 3.5 cm and was superficial in depth.  I placed a 5 x 5 cm myriad matrix and trimmed to the size of the wound.  The matrix was sutured to the skin edges with interrupted Monocryl sutures and was covered with a piece of Sorbact antimicrobial dressing which was sutured to the skin with interrupted 4-0 Prolene sutures.  Dressings were applied and the patient was awakened from anesthesia without incident.  He was transferred to the recovery  room in good condition.  All instrument needle and sponge counts were reported as correct.  There were no complications noted. The patient was allowed to wake up and taken to recovery room in stable condition at the end of the case. The family was notified at the end of the case.   The advanced practice practitioner (APP) assisted throughout the case.  The APP was essential in retraction and counter traction when needed to make the case progress smoothly.  This retraction and assistance made it possible to see the tissue plans for the procedure.  The assistance was needed for blood control, tissue re-approximation and assisted with closure of the incision site.

## 2023-03-07 ENCOUNTER — Inpatient Hospital Stay: Payer: Medicare Other | Admitting: Hematology and Oncology

## 2023-03-07 ENCOUNTER — Inpatient Hospital Stay: Payer: Medicare Other | Attending: Nurse Practitioner

## 2023-03-07 ENCOUNTER — Encounter (HOSPITAL_BASED_OUTPATIENT_CLINIC_OR_DEPARTMENT_OTHER): Payer: Self-pay | Admitting: Plastic Surgery

## 2023-03-07 ENCOUNTER — Other Ambulatory Visit: Payer: Self-pay | Admitting: Hematology and Oncology

## 2023-03-07 DIAGNOSIS — D500 Iron deficiency anemia secondary to blood loss (chronic): Secondary | ICD-10-CM

## 2023-03-07 LAB — SURGICAL PATHOLOGY

## 2023-03-07 NOTE — Anesthesia Postprocedure Evaluation (Signed)
Anesthesia Post Note  Patient: Paul [name]  Procedure(s) Performed: Excision of skin lesion, left hand (Left: Hand) myriad placement (Left: Hand)     Patient location during evaluation: PACU Anesthesia Type: General Level of consciousness: awake and alert Pain management: pain level controlled Vital Signs Assessment: post-procedure vital signs reviewed and stable Respiratory status: spontaneous breathing, nonlabored ventilation, respiratory function stable and patient connected to nasal cannula oxygen Cardiovascular status: blood pressure returned to baseline and stable Postop Assessment: no apparent nausea or vomiting Anesthetic complications: no   No notable events documented.  Last Vitals:  Vitals:   03/06/23 1215 03/06/23 1230  BP: 118/60 121/76  Pulse: 72 71  Resp: 14 (!) 196  Temp:  36.6 C  SpO2: 96% 98%    Last Pain:  Vitals:   03/07/23 0857  TempSrc:   PainSc: 2                  Tamme Mozingo S

## 2023-03-07 NOTE — Progress Notes (Deleted)
[facility] [phone]   [phone]  PROGRESS NOTE  Patient Care Team: Early, Sung Amabile, NP as PCP - General (Nurse Practitioner) [place], [name], MD as PCP - Cardiology (Cardiology) Bridgett Larsson, LCSW as [facility] Care Management (Licensed Clinical Social Worker)  Hematological/Oncological History # Iron Deficiency Anemia of Unclear Etiology  03/02/2022: WBC 6.7, Hgb 12.0, Plt 187, ANC 4.2, Cr 1.01 03/08/2022: establish care with Dr. Leonides Schanz   Interval History:  Paul [name] 72 y.o. male returns for a follow up for iron deficiency anemia. He is unaccompanied for this visit.   On exam today Mr. Starr reports that his energy and appetite are stable. He is having worsening left shoulder pain from arthritis. He takes percocet as needed with mild pain relief. He reports that he cannot receive steroid injections due to his diabetes. He is not interested in shoulder surgery at this time.   He is compliant with taking his iron pills and denies any signs of bleeding. He denies any nausea, vomiting or abdominal pain. His bowel habits are adequate without recurrent episodes of diarrhea or constipation. He has chronic shortness of breath with exertion. He denies fevers, chills, sweats, chest pain or cough.  A full 10 point ROS was otherwise negative.  MEDICAL HISTORY:  Past Medical History:  Diagnosis Date   Angina, class III (HCC)    Anxiety    Arthritis    Bilateral carotid artery stenosis    BPH without obstruction/lower urinary tract symptoms    CAD 11/24/2011   a) Mild-to-moderate 30-40% lesions in the RCA, LAD and Circumflex. b) CULPRIT LESION: long tubular 70-80% lesion in D1 with FFR of 0.7 --> PCI w/ Xience Xpedition DES 2.5 mm x 30 mm (2.65 MM); c) Lexiscan Myoview 11/2013: No Ischemia or Infarct (Inferior Gut Attenuation) EF 63%.   Chronic fatigue syndrome 06/13/2021   Chronic heart failure (HCC)    Chronic kidney disease (CKD),  stage III (moderate) (HCC)    Chronic pain syndrome    Cigarette smoker 02/22/2014   Cirrhosis (HCC)    Constipation by delayed colonic transit 06/13/2021   COPD (chronic obstructive pulmonary disease) (HCC)    "don't have full case of it; I'm right there at it"   Depression    Diabetes mellitus without complication (HCC)    Diverticulosis    Dysphagia    Elevated alkaline phosphatase level 08/17/2022   Elevated levels of transaminase & lactic acid dehydrogenase 06/09/2016   Essential hypertension 11/24/2011   Exertional dyspnea, chronic    Fatigue 12/08/2021   Former smoker    GERD (gastroesophageal reflux disease)    Gout    H/O: GI bleed    Hearing loss    Hyperglycemia 03/11/2019   Hyperlipidemia with target LDL less than 70 11/24/2011   Hypo-osmolality and hyponatremia 03/11/2019   Iron deficiency anemia    Lipoprotein deficiency disorder    Long term (current) use of insulin (HCC) 07/15/2019   Obesity (BMI 30.0-34.9)    OSA (obstructive sleep apnea), uses oxygen at home did not tolerate cpap 11/24/2011   Pain due to onychomycosis of toenails of both feet 08/23/2022   Pain in both lower extremities 02/14/2022   Paresthesia of skin 02/17/2015   Peripheral vascular disease (HCC)    Portal hypertension (HCC)    Right hip pain 06/13/2021   S/P CABG (coronary artery bypass graft)    Shoulder joint pain 02/17/2015   Spinal stenosis    Splenomegaly  Thrombocytopenia (HCC)    Vitamin D deficiency     SURGICAL HISTORY: Past Surgical History:  Procedure Laterality Date   CERVICAL SPINE SURGERY  2012   CORONARY ANGIOPLASTY WITH STENT PLACEMENT  11/23/2011   "1; first one"   CORONARY ARTERY BYPASS GRAFT N/A 01/25/2021   Procedure: CORONARY ARTERY BYPASS GRAFTING (CABG) X2, USING LEFT INTERNAL MAMMARY ARTERY AND LEFT LEG GREATER SAPHENOUS VEIN HARVESTED ENDOSCOPICALLY;  Surgeon: Corliss Skains, MD;  Location: [facility] OR;  Service: Open Heart Surgery;  Laterality: N/A;    ENDOVEIN HARVEST OF GREATER SAPHENOUS VEIN Bilateral 01/25/2021   Procedure: ENDOVEIN HARVEST OF GREATER SAPHENOUS VEIN;  Surgeon: Corliss Skains, MD;  Location: [facility] OR;  Service: Open Heart Surgery;  Laterality: Bilateral;   LEFT HEART CATH AND CORONARY ANGIOGRAPHY N/A 12/28/2020   Procedure: LEFT HEART CATH AND CORONARY ANGIOGRAPHY;  Surgeon: Marykay Lex, MD;  Location: [facility] INVASIVE CV LAB;  Service: Cardiovascular;  Laterality: N/A;   LEFT HEART CATHETERIZATION WITH CORONARY ANGIOGRAM N/A 11/23/2011   Procedure: LEFT HEART CATHETERIZATION WITH CORONARY ANGIOGRAM;  Surgeon: Marykay Lex, MD;  Location: [facility] CATH LAB;  Service: Cardiovascular;  Laterality: N/A;   LESION REMOVAL Left 03/06/2023   Procedure: Excision of skin lesion, left hand;  Surgeon: Santiago Glad, MD;  Location: [facility];  Service: Plastics;  Laterality: Left;   TEE WITHOUT CARDIOVERSION N/A 01/25/2021   Procedure: TRANSESOPHAGEAL ECHOCARDIOGRAM (TEE);  Surgeon: Corliss Skains, MD;  Location: [facility];  Service: Open Heart Surgery;  Laterality: N/A;    SOCIAL HISTORY: Social History   Socioeconomic History   Marital status: Divorced    Spouse name: Not on file   Number of children: 3   Years of education: 11th   Highest education level: 11th grade  Occupational History   Not on file  Tobacco Use   Smoking status: Former    Current packs/day: 0.00    Average packs/day: 1 pack/day for 40.0 years (40.0 ttl pk-yrs)    Types: Cigarettes    Start date: 06/10/1976    Quit date: 06/10/2016    Years since quitting: 6.7    Passive exposure: Past   Smokeless tobacco: Never   Tobacco comments:    02/18/14- smokes occ cig maybe 3 x per wk  Vaping Use   Vaping status: Never Used  Substance and Sexual Activity   Alcohol use: Yes    Alcohol/week: 0.0 standard drinks of alcohol    Comment: social 2-3 drink a week   Drug use: No   Sexual activity: Not Currently    Partners: Female  Other  Topics Concern   Not on file  Social History Narrative   He is a 72 y.o. divorced father of 3, grandfather 1.   He is a retired Equities trader, former Nutritional therapist. 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   He quit smoking in August 2013, after smoking a pack a day for roughly 40 years.   He drinks socially 2-3 drinks a week only.   He does not get routine exercise, mostly due to 2 fatigue and dyspnea. Otherwise been relatively sedentary.   Social Determinants of Health   Financial Resource Strain: Low Risk  (11/21/2022)   Overall Financial Resource Strain (CARDIA)    Difficulty of Paying Living Expenses: Not hard at all  Food Insecurity: No Food Insecurity (11/21/2022)   Hunger Vital Sign    Worried  About Running Out of Food in the Last Year: Never true    Ran Out of Food in the Last Year: Never true  Transportation Needs: No Transportation Needs (11/21/2022)   PRAPARE - Administrator, Civil Service (Medical): No    Lack of Transportation (Non-Medical): No  Physical Activity: Inactive (11/21/2022)   Exercise Vital Sign    Days of Exercise per Week: 0 days    Minutes of Exercise per Session: 0 min  Stress: No Stress Concern Present (11/21/2022)   [employer] of Occupational Health - Occupational Stress Questionnaire    Feeling of Stress : Not at all  Social Connections: Socially Isolated (11/21/2022)   Social Connection and Isolation Panel [NHANES]    Frequency of Communication with Friends and Family: More than three times a week    Frequency of Social Gatherings with Friends and Family: Three times a week    Attends Religious Services: Never    Active Member of Clubs or Organizations: No    Attends Banker Meetings: Never    Marital Status: Divorced  Catering manager Violence: Not At Risk (11/21/2022)   Humiliation, Afraid, Rape, and Kick questionnaire    Fear of Current or  Ex-Partner: No    Emotionally Abused: No    Physically Abused: No    Sexually Abused: No    FAMILY HISTORY: Family History  Problem Relation Age of Onset   Heart disease Sister    Heart attack Brother    Emphysema Father        smoked   Aneurysm Father     ALLERGIES:  is allergic to morphine and codeine, cortisone, and other.  MEDICATIONS:  Current Outpatient Medications  Medication Sig Dispense Refill   acetaminophen (TYLENOL) 500 MG tablet Take 1,500-2,000 mg by mouth daily.     albuterol (VENTOLIN HFA) 108 (90 Base) MCG/ACT inhaler Inhale 2 puffs into the lungs every 4 (four) hours as needed. For breathing. 18 g 3   allopurinol (ZYLOPRIM) 100 MG tablet Take 1 tablet (100 mg total) by mouth daily. 90 tablet 3   amLODipine (NORVASC) 5 MG tablet Take 1 tablet (5 mg total) by mouth daily. (Patient not taking: Reported on 02/16/2023) 90 tablet 3   aspirin 81 MG EC tablet Take 1 tablet (81 mg total) by mouth daily. 30 tablet 0   celecoxib (CELEBREX) 100 MG capsule Take 1 capsule (100 mg total) by mouth 2 (two) times daily. 180 capsule 3   Continuous Glucose Receiver (FREESTYLE LIBRE 3 READER) DEVI Use with freestyle libre 3 sensor 1 each 1   Continuous Glucose Sensor (FREESTYLE LIBRE 3 SENSOR) MISC      DULoxetine (CYMBALTA) 20 MG capsule Take 1 capsule (20 mg total) by mouth daily for 7 days, THEN 2 capsules (40 mg total) daily. For pain management.. 173 capsule 0   gabapentin (NEURONTIN) 300 MG capsule Take 2 capsules (600 mg total) by mouth 3 (three) times daily. For back and foot pain. 540 capsule 3   glucose blood (ONETOUCH VERIO) test strip Use to check blood glucose 3 times daily 300 each 3   HUMALOG KWIKPEN 200 UNIT/ML KwikPen INJECT 14 UNITS UNDER THE SKIN EVERY MORNING, 14 UNITS WITH LUNCH AND 14 UNITS WITH SUPPER, DAILY 12 mL 5   HYDROcodone-acetaminophen (NORCO/VICODIN) 5-325 MG tablet Take 1-2 tablets by mouth in the morning AND 1 tablet daily at 12 noon AND 1 tablet at  bedtime. 120 tablet 0   insulin glargine, 1 Unit  Dial, (TOUJEO SOLOSTAR) 300 UNIT/ML Solostar Pen Inject 40 Units into the skin daily. 18 mL 3   Insulin Pen Needle 32G X 4 MM MISC Use daily with insulin as directed 100 each 3   Iron, Ferrous Sulfate, 325 (65 Fe) MG TABS Take 1 tablet (325 mg) by mouth daily. 30 tablet 3   Lidocaine (ZTLIDO) 1.8 % PTCH Apply 1 patch topically daily. (May wear up to 12hours.) (Patient not taking: Reported on 02/16/2023) 30 patch 2   metFORMIN (GLUCOPHAGE-XR) 500 MG 24 hr tablet Take 2 tablets (1,000 mg total) by mouth 2 (two) times daily. 360 tablet 3   metoprolol tartrate (LOPRESSOR) 25 MG tablet Take 1 tablet (25 mg total) by mouth 2 (two) times daily. 180 tablet 3   pantoprazole (PROTONIX) 40 MG tablet Take 1 tablet (40 mg total) by mouth daily. 90 tablet 3   potassium chloride (KLOR-CON M) 10 MEQ tablet Take 1 tablet (10 mEq total) by mouth daily. (Patient not taking: Reported on 02/16/2023) 90 tablet 3   rosuvastatin (CRESTOR) 20 MG tablet Take 1 tablet (20 mg total) by mouth daily. 90 tablet 3   Tetrahydrozoline HCl (VISINE EXTRA OP) Place 1 drop into both eyes as needed (itching).     umeclidinium-vilanterol (ANORO ELLIPTA) 62.5-25 MCG/ACT AEPB Inhale 1 puff into the lungs daily. 90 each 3   zolpidem (AMBIEN) 10 MG tablet Take 1 tablet (10 mg total) by mouth at bedtime as needed for sleep 30 tablet 2   No current facility-administered medications for this visit.    REVIEW OF SYSTEMS:   Constitutional: ( - ) fevers, ( - )  chills , ( - ) night sweats Eyes: ( - ) blurriness of vision, ( - ) double vision, ( - ) watery eyes Ears, nose, mouth, throat, and face: ( - ) mucositis, ( - ) sore throat Respiratory: ( - ) cough, ( - ) dyspnea, ( - ) wheezes Cardiovascular: ( - ) palpitation, ( - ) chest discomfort, ( - ) lower extremity swelling Gastrointestinal:  ( - ) nausea, ( - ) heartburn, ( - ) change in bowel habits Skin: ( - ) abnormal skin rashes Lymphatics:  ( - ) new lymphadenopathy, ( - ) easy bruising Neurological: ( - ) numbness, ( - ) tingling, ( - ) new weaknesses Behavioral/Psych: ( - ) mood change, ( - ) new changes  All other systems were reviewed with the patient and are negative.  PHYSICAL EXAMINATION: ECOG PERFORMANCE STATUS: 1 - Symptomatic but completely ambulatory  There were no vitals filed for this visit.  There were no vitals filed for this visit.   GENERAL: Well-appearing elderly Caucasian male, alert, no distress and comfortable SKIN: skin color, texture, turgor are normal, no rashes or significant lesions EYES: conjunctiva are pink and non-injected, sclera clear LUNGS: clear to auscultation and percussion with normal breathing effort HEART: regular rate & rhythm and no murmurs and no lower extremity edema Musculoskeletal: no cyanosis of digits and no clubbing  PSYCH: alert & oriented x 3, fluent speech NEURO: no focal motor/sensory deficits  LABORATORY DATA:  I have reviewed the data as listed    Latest Ref Rng & Units 01/11/2023   12:42 PM 09/06/2022    1:02 PM 08/10/2022    3:25 PM  CBC  WBC 3.4 - 10.8 x10E3/uL 6.6  6.1  7.4   Hemoglobin 13.0 - 17.7 g/dL 16.1  09.6  04.5   Hematocrit 37.5 - 51.0 % 44.6  41.5  42.9   Platelets 150 - 450 x10E3/uL 139  134  135        Latest Ref Rng & Units 02/27/2023    2:01 PM 01/11/2023   12:42 PM 09/06/2022    1:02 PM  CMP  Glucose 70 - 99 mg/dL 161  096  045   BUN 8 - 23 mg/dL 20  18  14    Creatinine 0.61 - 1.24 mg/dL 4.09  8.11  9.14   Sodium 135 - 145 mmol/L 137  139  139   Potassium 3.5 - 5.1 mmol/L 5.0  4.9  4.6   Chloride 98 - 111 mmol/L 109  106  110   CO2 22 - 32 mmol/L 18  17  23    Calcium 8.9 - 10.3 mg/dL 9.8  8.9  9.1   Total Protein 6.0 - 8.5 g/dL  6.9  7.2   Total Bilirubin 0.0 - 1.2 mg/dL  0.8  0.6   Alkaline Phos 44 - 121 IU/L  116  111   AST 0 - 40 IU/L  53  48   ALT 0 - 44 IU/L  37  37     Lab Results  Component Value Date   MPROTEIN Not  Observed 03/08/2022   Lab Results  Component Value Date   KPAFRELGTCHN 46.6 (H) 03/08/2022   LAMBDASER 23.3 03/08/2022   KAPLAMBRATIO 2.00 (H) 03/08/2022    RADIOGRAPHIC STUDIES: No results found.  ASSESSMENT & PLAN Jyles Dubord 72 y.o. male with medical history significant for iron deficiency anemia who presents for a follow up visit.   # Iron Deficiency Anemia 2/2 to Unclear Etiology, Suspect GI Bleeding from cirrhosis -- Findings are consistent with iron deficiency anemia. Under the care of [facility]. -- Currently taking ferrous sulfate 325 mg. --Labs today show white blood cell count 6.2, hemoglobin 14.0, MCV 94.3, and platelets of 134.  Iron panel shows no deficiency with serum iron 139, saturation 35%, TIBC 400. Ferritin pending. --No need for IV iron at this time. Recommend to continue on PO iron supplementation --Plan for return to clinic in 6 months with repeat labs  #Cirrhosis/Splenomegaly/Portal hypertension: --Seen with CT abdomen/pelvis from 04/25/2022.  --Patient is not clear about diagnosis so we will request a follow up with patient's gastroenterologist at [facility].   #Thrombocytopenia: --Mild and likely secondary to splenomegaly --No overt signs of bleeding --Monitor for now.   No orders of the defined types were placed in this encounter.   All questions were answered. The patient knows to call the clinic with any problems, questions or concerns.  A total of more than 30 minutes were spent on this encounter with face-to-face time and non-face-to-face time, including preparing to see the patient, ordering tests and/or medications, counseling the patient and coordination of care as outlined above.   Georga Kaufmann PA-C Dept of Hematology and Oncology [facility] at [facility] Phone: [phone]  03/07/2023 1:13 PM

## 2023-03-08 ENCOUNTER — Telehealth: Payer: Self-pay

## 2023-03-08 ENCOUNTER — Ambulatory Visit (INDEPENDENT_AMBULATORY_CARE_PROVIDER_SITE_OTHER): Payer: Medicare Other | Admitting: Student

## 2023-03-08 DIAGNOSIS — C44629 Squamous cell carcinoma of skin of left upper limb, including shoulder: Secondary | ICD-10-CM

## 2023-03-08 NOTE — Telephone Encounter (Signed)
Faxed wound supply order, demographics, ins card and most recent ov note to [employer]. Received fax success confirmation. Once correspondence is received, will send both correspondence and order to front desk for batch scanning.

## 2023-03-08 NOTE — Progress Notes (Signed)
Patient is a 72 year old male with history of a left hand skin lesion.  He underwent excision of the skin lesion with placement of myriad tissue wound matrix with Dr. Ladona Ridgel on 03/06/2023.  He is about 2 days postop.  He presents to the clinic today for evaluation and dressing change.  Per chart review, the lesion was sent to pathology.  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  Margins were negative for carcinoma, carcinoma was 0.1 cm from the deep margin.  Today, patient reports he is doing well.  Denies any pain to his hand.  Denies any issues or concerns in surgery.  Discussed results of the pathology with the patient.  Did recommend that he follow-up with dermatology closely.  He states he did not have a dermatologist.  Will place referral for dermatology.  Also discussed with him the possibility of reexcision given how close the margin was.  I told him that I would have to discuss with Dr. Ladona Ridgel.  Patient expressed understanding.  On exam, patient is sitting upright in no acute distress.  Dressings were removed.  Chilton Si Sorbact is secured in place with sutures.  There is no surrounding erythema or drainage.  No signs of infection on exam.   Wound was dressed with K-Y jelly over the Sorbact, moistened gauze, Kerlix and Ace wrap.  I discussed with the patient that he will have to do these dressing changes daily.  I showed him how to do the dressing changes as well as wrote them down for him.  He states that he has a friend who is able to help him and he expressed understanding of the dressing changes.  I did discuss with the patient that he is not able to get the area wet.  He expressed understanding.  We will have the patient follow back up next week.  Instructed him to call in the meantime if he has any questions or concerns about anything.

## 2023-03-09 ENCOUNTER — Other Ambulatory Visit (HOSPITAL_COMMUNITY): Payer: Self-pay

## 2023-03-13 ENCOUNTER — Other Ambulatory Visit: Payer: Medicare Other

## 2023-03-13 NOTE — Progress Notes (Deleted)
03/13/2023 Name: Safaree Berte MRN: [id number] DOB: 26-Dec-1950  No chief complaint on file.   Robertt Whitty is a 72 y.o. year old male who presented for a telephone visit.   They were referred to the pharmacist by their PCP for assistance in managing complex medication management.    Subjective:  Care Team: Primary Care Provider: Tollie Eth, NP ; Next Scheduled Visit: 03/14/23  Medication Access/Adherence  Current Pharmacy:  [facility] [address] [place] [address] Phone: [phone] Fax: [phone]  [facility] - [place], [place] - [address] [place] [address] Phone: [phone] Fax: [phone]   Patient reports affordability concerns with their medications: {YES/NO:21197} Patient reports access/transportation concerns to their pharmacy: {YES/NO:21197} Patient reports adherence concerns with their medications:  {YES/NO:21197} ***   Diabetes:  Current medications: Humalog 14 units TID ac, Toujeo 40 units, Metformin XR 1000mg  BID Medications tried in the past: Glipizide  Current glucose readings: *** Using *** meter; testing *** times daily  Date of Download: *** % Time CGM is active: ***% Average Glucose: *** mg/dL Glucose Management Indicator: ***  Glucose Variability: *** (goal <36%) Time in Goal:  - Time in range 70-180: ***% - Time above range: ***% - Time below range: ***% Observed patterns:  Patient {Actions; denies-reports:120008} hypoglycemic s/sx including ***dizziness, shakiness, sweating. Patient {Actions; denies-reports:120008} hyperglycemic symptoms including ***polyuria, polydipsia, polyphagia, nocturia, neuropathy, blurred vision.  Current meal patterns:  - Breakfast: *** - Lunch *** - Supper *** - Snacks *** - Drinks ***  Current physical activity: ***  Current medication access support: ***  Hypertension:  Current medications:  Amlodipine 5mg , Metoprolol tartrate 25mg  Medications previously tried: Lasix  Patient {HAS/DOES NOT BTDV:76160} a validated, automated, upper arm home BP cuff Current blood pressure readings readings: ***  Patient {Actions; denies-reports:120008} hypotensive s/sx including ***dizziness, lightheadedness.  Patient {Actions; denies-reports:120008} hypertensive symptoms including ***headache, chest pain, shortness of breath  Current meal patterns: ***  Current physical activity: ***   Hyperlipidemia/ASCVD Risk Reduction  Current lipid lowering medications: Rosuvastatin 20mg  Medications tried in the past: Atorvastatin  Antiplatelet regimen: Aspirin 81mg    Current physical activity: ***  Current medication access support: ***    Heart Failure (EF 65-70%):  Current medications:  ACEi/ARB/ARNI: None SGLT2i: None Beta blocker: Metoprolol tartrate 25mg  BID Mineralocorticoid Receptor Antagonist: None Diuretic regimen: None  Current home blood pressure readings: *** Current home weights: ***  Patient {Actions; denies-reports:120008} volume overload signs or symptoms including ***shortness of breath, lower extremity edema, increased use of pillows at night   Current physical activity: ***  Current medication access support: ***  COPD:  Current medications: Ventolin HFA prn, Anoro Ellipta Medications tried in the past: Stiolto, Spiriva, Brovana, Duoneb, Advair  Reports 1 exacerbations in the past year  Current medication access support: ***  Depression/Anxiety/Insomnia :  Current medications: Cymbalta 20mg , Ambien 10mg  Medications tried in the past: Xanax, Valium  Behavioral Health support: ***  Current medication access support: ***  Chronic Pain: Current Medications: Gabapentin, Cymbalta, Norco, Tylenol   Objective:  Lab Results  Component Value Date   HGBA1C 7.1 (H) 01/11/2023    Lab Results  Component Value Date   CREATININE 0.91 02/27/2023   BUN  20 02/27/2023   NA 137 02/27/2023   K 5.0 02/27/2023   CL 109 02/27/2023   CO2 18 (L) 02/27/2023    Lab Results  Component Value Date   CHOL 76 (  L) 01/11/2023   HDL 23 (L) 01/11/2023   LDLCALC 15 01/11/2023   TRIG 255 (H) 01/11/2023   CHOLHDL 3.3 01/11/2023    Medications Reviewed Today   Medications were not reviewed in this encounter       Assessment/Plan:   Diabetes: - Currently {CHL Controlled/Uncontrolled:[phone]} - Reviewed long term cardiovascular and renal outcomes of uncontrolled blood sugar - Reviewed goal A1c, goal fasting, and goal 2 hour post prandial glucose - Reviewed dietary modifications including *** - Reviewed lifestyle modifications including: - Recommend to ***  - Recommend to check glucose *** - Meets financial criteria for *** patient assistance program through ***. Will collaborate with provider, CPhT, and patient to pursue assistance.      Hyperlipidemia/ASCVD Risk Reduction: - Currently {CHL Controlled/Uncontrolled:[phone]}.  - Reviewed long term complications of uncontrolled cholesterol - Reviewed dietary recommendations including *** - Reviewed lifestyle recommendations including *** - Recommend to ***  - Meets financial criteria for *** patient assistance program through ***. Will collaborate with provider, CPhT, and patient to pursue assistance.     Heart Failure: - Currently {managed:26422} - Reviewed appropriate blood pressure monitoring technique and reviewed goal blood pressure - Reviewed to weigh daily and when to contact cardiology with weight gain - Reviewed dietary modifications including *** - Recommend to ***  - Meets financial criteria for *** patient assistance program through ***. Will collaborate with provider, CPhT, and patient to pursue assistance.     COPD: - Currently {CHL Controlled/Uncontrolled:[phone]}.  - Reviewed appropriate inhaler technique. - Recommend to ***  - Meets financial criteria for  *** patient assistance program through ***. Will collaborate with provider, CPhT, and patient to pursue assistance.     Chronic Pain  Follow Up Plan: ***  Sherrill Raring, PharmD Clinical Pharmacist [phone]

## 2023-03-14 ENCOUNTER — Encounter: Payer: Medicare Other | Admitting: Plastic Surgery

## 2023-03-14 ENCOUNTER — Ambulatory Visit: Payer: Medicare Other | Admitting: Nurse Practitioner

## 2023-03-17 ENCOUNTER — Observation Stay (HOSPITAL_COMMUNITY)
Admission: EM | Admit: 2023-03-17 | Discharge: 2023-03-19 | Disposition: A | Discharge status: Home / Self Care | Payer: Medicare Other | Attending: Emergency Medicine | Admitting: Emergency Medicine

## 2023-03-17 ENCOUNTER — Other Ambulatory Visit: Payer: Self-pay

## 2023-03-17 ENCOUNTER — Emergency Department (HOSPITAL_COMMUNITY): Payer: Medicare Other

## 2023-03-17 ENCOUNTER — Encounter (HOSPITAL_COMMUNITY): Payer: Self-pay | Admitting: Emergency Medicine

## 2023-03-17 DIAGNOSIS — E1122 Type 2 diabetes mellitus with diabetic chronic kidney disease: Secondary | ICD-10-CM | POA: Diagnosis not present

## 2023-03-17 DIAGNOSIS — I5032 Chronic diastolic (congestive) heart failure: Secondary | ICD-10-CM | POA: Insufficient documentation

## 2023-03-17 DIAGNOSIS — Z7984 Long term (current) use of oral hypoglycemic drugs: Secondary | ICD-10-CM | POA: Diagnosis not present

## 2023-03-17 DIAGNOSIS — K219 Gastro-esophageal reflux disease without esophagitis: Secondary | ICD-10-CM | POA: Diagnosis not present

## 2023-03-17 DIAGNOSIS — E1140 Type 2 diabetes mellitus with diabetic neuropathy, unspecified: Secondary | ICD-10-CM

## 2023-03-17 DIAGNOSIS — G4700 Insomnia, unspecified: Secondary | ICD-10-CM | POA: Diagnosis not present

## 2023-03-17 DIAGNOSIS — E161 Other hypoglycemia: Secondary | ICD-10-CM | POA: Diagnosis not present

## 2023-03-17 DIAGNOSIS — Z7982 Long term (current) use of aspirin: Secondary | ICD-10-CM | POA: Insufficient documentation

## 2023-03-17 DIAGNOSIS — E1142 Type 2 diabetes mellitus with diabetic polyneuropathy: Secondary | ICD-10-CM

## 2023-03-17 DIAGNOSIS — E1165 Type 2 diabetes mellitus with hyperglycemia: Secondary | ICD-10-CM | POA: Diagnosis not present

## 2023-03-17 DIAGNOSIS — D500 Iron deficiency anemia secondary to blood loss (chronic): Secondary | ICD-10-CM | POA: Diagnosis not present

## 2023-03-17 DIAGNOSIS — J189 Pneumonia, unspecified organism: Secondary | ICD-10-CM

## 2023-03-17 DIAGNOSIS — R531 Weakness: Principal | ICD-10-CM | POA: Insufficient documentation

## 2023-03-17 DIAGNOSIS — Z794 Long term (current) use of insulin: Secondary | ICD-10-CM | POA: Diagnosis not present

## 2023-03-17 DIAGNOSIS — R2681 Unsteadiness on feet: Secondary | ICD-10-CM | POA: Diagnosis not present

## 2023-03-17 DIAGNOSIS — Z951 Presence of aortocoronary bypass graft: Secondary | ICD-10-CM | POA: Diagnosis not present

## 2023-03-17 DIAGNOSIS — Z955 Presence of coronary angioplasty implant and graft: Secondary | ICD-10-CM | POA: Insufficient documentation

## 2023-03-17 DIAGNOSIS — Z1152 Encounter for screening for COVID-19: Secondary | ICD-10-CM | POA: Insufficient documentation

## 2023-03-17 DIAGNOSIS — J449 Chronic obstructive pulmonary disease, unspecified: Secondary | ICD-10-CM

## 2023-03-17 DIAGNOSIS — R0609 Other forms of dyspnea: Secondary | ICD-10-CM

## 2023-03-17 DIAGNOSIS — E162 Hypoglycemia, unspecified: Principal | ICD-10-CM

## 2023-03-17 DIAGNOSIS — D72829 Elevated white blood cell count, unspecified: Secondary | ICD-10-CM | POA: Diagnosis not present

## 2023-03-17 DIAGNOSIS — N179 Acute kidney failure, unspecified: Secondary | ICD-10-CM | POA: Diagnosis not present

## 2023-03-17 DIAGNOSIS — I2510 Atherosclerotic heart disease of native coronary artery without angina pectoris: Secondary | ICD-10-CM | POA: Insufficient documentation

## 2023-03-17 DIAGNOSIS — Z7409 Other reduced mobility: Secondary | ICD-10-CM | POA: Diagnosis not present

## 2023-03-17 DIAGNOSIS — R0602 Shortness of breath: Secondary | ICD-10-CM | POA: Diagnosis not present

## 2023-03-17 DIAGNOSIS — N183 Chronic kidney disease, stage 3 unspecified: Secondary | ICD-10-CM | POA: Insufficient documentation

## 2023-03-17 DIAGNOSIS — E785 Hyperlipidemia, unspecified: Secondary | ICD-10-CM | POA: Diagnosis not present

## 2023-03-17 DIAGNOSIS — Z79899 Other long term (current) drug therapy: Secondary | ICD-10-CM | POA: Diagnosis not present

## 2023-03-17 DIAGNOSIS — E11649 Type 2 diabetes mellitus with hypoglycemia without coma: Secondary | ICD-10-CM | POA: Diagnosis not present

## 2023-03-17 DIAGNOSIS — I130 Hypertensive heart and chronic kidney disease with heart failure and stage 1 through stage 4 chronic kidney disease, or unspecified chronic kidney disease: Secondary | ICD-10-CM | POA: Diagnosis not present

## 2023-03-17 DIAGNOSIS — R059 Cough, unspecified: Secondary | ICD-10-CM | POA: Diagnosis not present

## 2023-03-17 DIAGNOSIS — J168 Pneumonia due to other specified infectious organisms: Secondary | ICD-10-CM | POA: Diagnosis not present

## 2023-03-17 DIAGNOSIS — J069 Acute upper respiratory infection, unspecified: Secondary | ICD-10-CM | POA: Diagnosis not present

## 2023-03-17 DIAGNOSIS — K7460 Unspecified cirrhosis of liver: Secondary | ICD-10-CM | POA: Diagnosis not present

## 2023-03-17 DIAGNOSIS — G4733 Obstructive sleep apnea (adult) (pediatric): Secondary | ICD-10-CM

## 2023-03-17 DIAGNOSIS — E440 Moderate protein-calorie malnutrition: Secondary | ICD-10-CM | POA: Insufficient documentation

## 2023-03-17 DIAGNOSIS — J9811 Atelectasis: Secondary | ICD-10-CM | POA: Diagnosis not present

## 2023-03-17 DIAGNOSIS — J439 Emphysema, unspecified: Secondary | ICD-10-CM | POA: Diagnosis not present

## 2023-03-17 LAB — CBC WITH DIFFERENTIAL/PLATELET
Abs Immature Granulocytes: 0.08 10*3/uL — ABNORMAL HIGH (ref 0.00–0.07)
Basophils Absolute: 0.1 10*3/uL (ref 0.0–0.1)
Basophils Relative: 1 %
Eosinophils Absolute: 0.1 10*3/uL (ref 0.0–0.5)
Eosinophils Relative: 1 %
HCT: 48.6 % (ref 39.0–52.0)
Hemoglobin: 16.3 g/dL (ref 13.0–17.0)
Immature Granulocytes: 1 %
Lymphocytes Relative: 12 %
Lymphs Abs: 1.5 10*3/uL (ref 0.7–4.0)
MCH: 31 pg (ref 26.0–34.0)
MCHC: 33.5 g/dL (ref 30.0–36.0)
MCV: 92.4 fL (ref 80.0–100.0)
Monocytes Absolute: 0.9 10*3/uL (ref 0.1–1.0)
Monocytes Relative: 7 %
Neutro Abs: 10.5 10*3/uL — ABNORMAL HIGH (ref 1.7–7.7)
Neutrophils Relative %: 78 %
Platelets: 195 10*3/uL (ref 150–400)
RBC: 5.26 MIL/uL (ref 4.22–5.81)
RDW: 12.3 % (ref 11.5–15.5)
WBC: 13.1 10*3/uL — ABNORMAL HIGH (ref 4.0–10.5)
nRBC: 0 % (ref 0.0–0.2)

## 2023-03-17 LAB — URINALYSIS, ROUTINE W REFLEX MICROSCOPIC
Bacteria, UA: NONE SEEN
Bilirubin Urine: NEGATIVE
Glucose, UA: NEGATIVE mg/dL
Hgb urine dipstick: NEGATIVE
Ketones, ur: NEGATIVE mg/dL
Leukocytes,Ua: NEGATIVE
Nitrite: NEGATIVE
Protein, ur: NEGATIVE mg/dL
Specific Gravity, Urine: >1.046 — ABNORMAL HIGH (ref 1.005–1.030)
pH: 5 (ref 5.0–8.0)

## 2023-03-17 LAB — COMPREHENSIVE METABOLIC PANEL
ALT: 26 U/L (ref 0–44)
AST: 37 U/L (ref 15–41)
Albumin: 3.8 g/dL (ref 3.5–5.0)
Alkaline Phosphatase: 106 U/L (ref 38–126)
Anion gap: 13 (ref 5–15)
BUN: 35 mg/dL — ABNORMAL HIGH (ref 8–23)
CO2: 21 mmol/L — ABNORMAL LOW (ref 22–32)
Calcium: 9.1 mg/dL (ref 8.9–10.3)
Chloride: 99 mmol/L (ref 98–111)
Creatinine, Ser: 1.37 mg/dL — ABNORMAL HIGH (ref 0.61–1.24)
GFR, Estimated: 54 mL/min — ABNORMAL LOW (ref >60)
Glucose, Bld: 208 mg/dL — ABNORMAL HIGH (ref 70–99)
Potassium: 4.4 mmol/L (ref 3.5–5.1)
Sodium: 133 mmol/L — ABNORMAL LOW (ref 135–145)
Total Bilirubin: 1.3 mg/dL — ABNORMAL HIGH (ref <1.2)
Total Protein: 6.9 g/dL (ref 6.5–8.1)

## 2023-03-17 LAB — CBG MONITORING, ED
Glucose-Capillary: 56 mg/dL — ABNORMAL LOW (ref 70–99)
Glucose-Capillary: 66 mg/dL — ABNORMAL LOW (ref 70–99)
Glucose-Capillary: 78 mg/dL (ref 70–99)
Glucose-Capillary: 238 mg/dL — ABNORMAL HIGH (ref 70–99)
Glucose-Capillary: 271 mg/dL — ABNORMAL HIGH (ref 70–99)
Glucose-Capillary: 215 mg/dL — ABNORMAL HIGH (ref 70–99)
Glucose-Capillary: 251 mg/dL — ABNORMAL HIGH (ref 70–99)
Glucose-Capillary: 182 mg/dL — ABNORMAL HIGH (ref 70–99)
Glucose-Capillary: 122 mg/dL — ABNORMAL HIGH (ref 70–99)

## 2023-03-17 LAB — RESP PANEL BY RT-PCR (RSV, FLU A&B, COVID)  RVPGX2
Influenza A by PCR: NEGATIVE
Influenza B by PCR: NEGATIVE
Resp Syncytial Virus by PCR: NEGATIVE
SARS Coronavirus 2 by RT PCR: NEGATIVE

## 2023-03-17 LAB — TROPONIN I (HIGH SENSITIVITY)
Troponin I (High Sensitivity): 13 ng/L (ref <18)
Troponin I (High Sensitivity): 11 ng/L (ref <18)

## 2023-03-17 LAB — GLUCOSE, CAPILLARY
Glucose-Capillary: 143 mg/dL — ABNORMAL HIGH (ref 70–99)
Glucose-Capillary: 78 mg/dL (ref 70–99)

## 2023-03-17 MED ORDER — SODIUM CHLORIDE 0.9% FLUSH: 3.0000 mL | INTRAVENOUS | Status: DC | PRN

## 2023-03-17 MED ORDER — ASPIRIN 81 MG PO TBEC
81.0000 mg | DELAYED_RELEASE_TABLET | Freq: Every day | ORAL | Status: DC
  Administered 2023-03-18 – 2023-03-19 (×2): 81 mg via ORAL
  Filled 2023-03-17 (×2): qty 1

## 2023-03-17 MED ORDER — MELATONIN 3 MG PO TABS
3.0000 mg | ORAL_TABLET | Freq: Every evening | ORAL | Status: DC | PRN
  Administered 2023-03-17: 3 mg via ORAL
  Filled 2023-03-17: qty 1

## 2023-03-17 MED ORDER — SODIUM CHLORIDE 0.9 % IV SOLN
1.0000 g | Freq: Once | INTRAVENOUS | Status: AC
  Administered 2023-03-17: 1 g via INTRAVENOUS
  Filled 2023-03-17: qty 10

## 2023-03-17 MED ORDER — ENOXAPARIN SODIUM 40 MG/0.4ML IJ SOSY
40.0000 mg | PREFILLED_SYRINGE | INTRAMUSCULAR | Status: DC
  Administered 2023-03-17 – 2023-03-18 (×2): 40 mg via SUBCUTANEOUS
  Filled 2023-03-17 (×2): qty 0.4

## 2023-03-17 MED ORDER — UMECLIDINIUM-VILANTEROL 62.5-25 MCG/ACT IN AEPB
1.0000 | INHALATION_SPRAY | Freq: Every day | RESPIRATORY_TRACT | Status: DC
Start: 2023-03-18 — End: 2023-03-19
  Administered 2023-03-18 – 2023-03-19 (×2): 1 via RESPIRATORY_TRACT
  Filled 2023-03-17: qty 14

## 2023-03-17 MED ORDER — SODIUM CHLORIDE 0.9 % IV SOLN
250.0000 mL | INTRAVENOUS | Status: AC | PRN
Start: 2023-03-17 — End: 2023-03-18

## 2023-03-17 MED ORDER — SODIUM CHLORIDE 0.9 % IV SOLN
500.0000 mg | Freq: Once | INTRAVENOUS | Status: AC
  Administered 2023-03-17: 500 mg via INTRAVENOUS
  Filled 2023-03-17: qty 5

## 2023-03-17 MED ORDER — ONDANSETRON HCL 4 MG PO TABS: 4.0000 mg | ORAL_TABLET | Freq: Four times a day (QID) | ORAL | Status: DC | PRN

## 2023-03-17 MED ORDER — METFORMIN HCL ER 500 MG PO TB24
1000.0000 mg | ORAL_TABLET | Freq: Two times a day (BID) | ORAL | Status: DC
Start: 2023-03-19 — End: 2023-03-19
  Filled 2023-03-17: qty 2

## 2023-03-17 MED ORDER — DEXTROSE 50 % IV SOLN
1.0000 | Freq: Once | INTRAVENOUS | Status: AC
  Administered 2023-03-17: 50 mL via INTRAVENOUS
  Filled 2023-03-17: qty 50

## 2023-03-17 MED ORDER — SENNOSIDES-DOCUSATE SODIUM 8.6-50 MG PO TABS: 1.0000 | ORAL_TABLET | Freq: Every evening | ORAL | Status: DC | PRN

## 2023-03-17 MED ORDER — GABAPENTIN 300 MG PO CAPS
600.0000 mg | ORAL_CAPSULE | Freq: Three times a day (TID) | ORAL | Status: DC
  Administered 2023-03-18 – 2023-03-19 (×4): 600 mg via ORAL
  Filled 2023-03-17 (×4): qty 2

## 2023-03-17 MED ORDER — LACTATED RINGERS IV BOLUS
1000.0000 mL | Freq: Once | INTRAVENOUS | Status: AC
  Administered 2023-03-17: 1000 mL via INTRAVENOUS

## 2023-03-17 MED ORDER — ACETAMINOPHEN 650 MG RE SUPP: 650.0000 mg | Freq: Four times a day (QID) | RECTAL | Status: DC | PRN

## 2023-03-17 MED ORDER — INSULIN ASPART 100 UNIT/ML IJ SOLN
0.0000 [IU] | INTRAMUSCULAR | Status: DC
  Administered 2023-03-17 – 2023-03-18 (×3): 2 [IU] via SUBCUTANEOUS
  Administered 2023-03-18: 8 [IU] via SUBCUTANEOUS
  Administered 2023-03-18: 3 [IU] via SUBCUTANEOUS
  Administered 2023-03-19: 2 [IU] via SUBCUTANEOUS
  Administered 2023-03-19: 5 [IU] via SUBCUTANEOUS

## 2023-03-17 MED ORDER — ACETAMINOPHEN 325 MG PO TABS: 650.0000 mg | ORAL_TABLET | Freq: Four times a day (QID) | ORAL | Status: DC | PRN

## 2023-03-17 MED ORDER — ENSURE ENLIVE PO LIQD
237.0000 mL | Freq: Two times a day (BID) | ORAL | Status: DC
  Administered 2023-03-18 – 2023-03-19 (×3): 237 mL via ORAL

## 2023-03-17 MED ORDER — LACTATED RINGERS IV BOLUS: 500.0000 mL | Freq: Once | INTRAVENOUS | Status: DC

## 2023-03-17 MED ORDER — SODIUM CHLORIDE 0.9 % IV BOLUS: 1000.0000 mL | Freq: Once | INTRAVENOUS | Status: DC

## 2023-03-17 MED ORDER — SODIUM CHLORIDE 0.9% FLUSH
3.0000 mL | Freq: Two times a day (BID) | INTRAVENOUS | Status: DC
  Administered 2023-03-17 – 2023-03-19 (×4): 3 mL via INTRAVENOUS

## 2023-03-17 MED ORDER — ONDANSETRON HCL 4 MG/2ML IJ SOLN: 4.0000 mg | Freq: Four times a day (QID) | INTRAMUSCULAR | Status: DC | PRN

## 2023-03-17 MED ORDER — IOHEXOL 350 MG/ML SOLN
75.0000 mL | Freq: Once | INTRAVENOUS | Status: AC | PRN
  Administered 2023-03-17: 75 mL via INTRAVENOUS

## 2023-03-17 NOTE — ED Triage Notes (Signed)
Patient presents via EMS from home for generalized weakness x3 days and hypoglycemia (EMS CBG 65). Per EMS, patient reported that he had surgery on a mass in his hand on 03/06/23 and that the patient states that he has been feeling generally unwell since then. EMS administered 15g oral glucose which improved CBG to 118.

## 2023-03-17 NOTE — ED Notes (Signed)
ED TO INPATIENT HANDOFF REPORT  ED Nurse Name and Phone #: Kaius Daino [phone]  S Name/Age/Gender Paul [name] 72 y.o. male Room/Bed: 037C/037C  Code Status   Code Status: Full Code  Home/SNF/Other Home Patient oriented to: self, place, time, and situation Is this baseline? Yes   Triage Complete: Triage complete  Chief Complaint Generalized weakness [R53.1]  Triage Note Patient presents via EMS from home for generalized weakness x3 days and hypoglycemia (EMS CBG 65). Per EMS, patient reported that he had surgery on a mass in his hand on 03/06/23 and that the patient states that he has been feeling generally unwell since then. EMS administered 15g oral glucose which improved CBG to 118.   Allergies Allergies  Allergen Reactions   Morphine And Codeine Itching    Possible itching due to morphine 01/26/21   Cortisone     Due to diabetes   Other Other (See Comments)    Cats- eyes burn    Level of Care/Admitting Diagnosis ED Disposition     ED Disposition  Admit   Condition  --   Comment  Hospital Area: [facility] [[id number]]  Level of Care: Med-Surg [16]  May place patient in observation at [facility] or [facility] if equivalent level of care is available:: No  Covid Evaluation: Asymptomatic - no recent exposure (last 10 days) testing not required  Diagnosis: Generalized weakness [[id number]]  Admitting Physician: Steffanie Rainwater [[id number]]  Attending Physician: Steffanie Rainwater [[id number]]          B Medical/Surgery History Past Medical History:  Diagnosis Date   Angina, class III (HCC)    Anxiety    Arthritis    Bilateral carotid artery stenosis    BPH without obstruction/lower urinary tract symptoms    CAD 11/24/2011   a) Mild-to-moderate 30-40% lesions in the RCA, LAD and Circumflex. b) CULPRIT LESION: long tubular 70-80% lesion in D1 with FFR of 0.7 --> PCI w/ Xience Xpedition DES 2.5 mm x 30 mm (2.65 MM); c) Lexiscan Myoview 11/2013: No  Ischemia or Infarct (Inferior Gut Attenuation) EF 63%.   Chronic fatigue syndrome 06/13/2021   Chronic heart failure (HCC)    Chronic kidney disease (CKD), stage III (moderate) (HCC)    Chronic pain syndrome    Cigarette smoker 02/22/2014   Cirrhosis (HCC)    Constipation by delayed colonic transit 06/13/2021   COPD (chronic obstructive pulmonary disease) (HCC)    "don't have full case of it; I'm right there at it"   Depression    Diabetes mellitus without complication (HCC)    Diverticulosis    Dysphagia    Elevated alkaline phosphatase level 08/17/2022   Elevated levels of transaminase & lactic acid dehydrogenase 06/09/2016   Essential hypertension 11/24/2011   Exertional dyspnea, chronic    Fatigue 12/08/2021   Former smoker    GERD (gastroesophageal reflux disease)    Gout    H/O: GI bleed    Hearing loss    Hyperglycemia 03/11/2019   Hyperlipidemia with target LDL less than 70 11/24/2011   Hypo-osmolality and hyponatremia 03/11/2019   Iron deficiency anemia    Lipoprotein deficiency disorder    Long term (current) use of insulin (HCC) 07/15/2019   Obesity (BMI 30.0-34.9)    OSA (obstructive sleep apnea), uses oxygen at home did not tolerate cpap 11/24/2011   Pain due to onychomycosis of toenails of both feet 08/23/2022   Pain in both lower extremities 02/14/2022   Paresthesia of skin 02/17/2015  Peripheral vascular disease (HCC)    Portal hypertension (HCC)    Right hip pain 06/13/2021   S/P CABG (coronary artery bypass graft)    Shoulder joint pain 02/17/2015   Spinal stenosis    Splenomegaly    Thrombocytopenia (HCC)    Vitamin D deficiency    Past Surgical History:  Procedure Laterality Date   CERVICAL SPINE SURGERY  2012   CORONARY ANGIOPLASTY WITH STENT PLACEMENT  11/23/2011   "1; first one"   CORONARY ARTERY BYPASS GRAFT N/A 01/25/2021   Procedure: CORONARY ARTERY BYPASS GRAFTING (CABG) X2, USING LEFT INTERNAL MAMMARY ARTERY AND LEFT LEG GREATER SAPHENOUS  VEIN HARVESTED ENDOSCOPICALLY;  Surgeon: Corliss Skains, MD;  Location: [facility] OR;  Service: Open Heart Surgery;  Laterality: N/A;   ENDOVEIN HARVEST OF GREATER SAPHENOUS VEIN Bilateral 01/25/2021   Procedure: ENDOVEIN HARVEST OF GREATER SAPHENOUS VEIN;  Surgeon: Corliss Skains, MD;  Location: [facility] OR;  Service: Open Heart Surgery;  Laterality: Bilateral;   LEFT HEART CATH AND CORONARY ANGIOGRAPHY N/A 12/28/2020   Procedure: LEFT HEART CATH AND CORONARY ANGIOGRAPHY;  Surgeon: Marykay Lex, MD;  Location: [facility] INVASIVE CV LAB;  Service: Cardiovascular;  Laterality: N/A;   LEFT HEART CATHETERIZATION WITH CORONARY ANGIOGRAM N/A 11/23/2011   Procedure: LEFT HEART CATHETERIZATION WITH CORONARY ANGIOGRAM;  Surgeon: Marykay Lex, MD;  Location: [facility] CATH LAB;  Service: Cardiovascular;  Laterality: N/A;   LESION REMOVAL Left 03/06/2023   Procedure: Excision of skin lesion, left hand;  Surgeon: Santiago Glad, MD;  Location: [facility];  Service: Plastics;  Laterality: Left;   TEE WITHOUT CARDIOVERSION N/A 01/25/2021   Procedure: TRANSESOPHAGEAL ECHOCARDIOGRAM (TEE);  Surgeon: Corliss Skains, MD;  Location: [facility] OR;  Service: Open Heart Surgery;  Laterality: N/A;     A IV Location/Drains/Wounds Patient Lines/Drains/Airways Status     Active Line/Drains/Airways     Name Placement date Placement time Site Days   Peripheral IV 03/17/23 20 G Right Antecubital 03/17/23  1158  Antecubital  less than 1   Peripheral IV 03/17/23 22 G Anterior;Right Forearm 03/17/23  1626  Forearm  less than 1   Pressure Injury 04/28/18 Stage I -  Intact skin with non-blanchable redness of a localized area usually over a bony prominence. Red bruise from ETT tube holder 04/28/18  1920  -- 1784   Wound / Incision (Open or Dehisced) 05/02/18 Other (Comment) Scrotum Left skin tear 05/02/18  0600  Scrotum  1780            Intake/Output Last 24 hours No intake or output data in the 24 hours ending  03/17/23 1731  Labs/Imaging Results for orders placed or performed during the hospital encounter of 03/17/23 (from the past 48 hour(s))  CBG monitoring, ED     Status: Abnormal   Collection Time: 03/17/23 11:44 AM  Result Value Ref Range   Glucose-Capillary 56 (L) 70 - 99 mg/dL    Comment: Glucose reference range applies only to samples taken after fasting for at least 8 hours.  CBG monitoring, ED     Status: None   Collection Time: 03/17/23 11:56 AM  Result Value Ref Range   Glucose-Capillary 78 70 - 99 mg/dL    Comment: Glucose reference range applies only to samples taken after fasting for at least 8 hours.  CBG monitoring, ED     Status: Abnormal   Collection Time: 03/17/23 12:15 PM  Result Value Ref Range   Glucose-Capillary 66 (L) 70 -  99 mg/dL    Comment: Glucose reference range applies only to samples taken after fasting for at least 8 hours.  CBG monitoring, ED (now and then every hour for 3 hours)     Status: Abnormal   Collection Time: 03/17/23 12:37 PM  Result Value Ref Range   Glucose-Capillary 271 (H) 70 - 99 mg/dL    Comment: Glucose reference range applies only to samples taken after fasting for at least 8 hours.  CBC with Differential     Status: Abnormal   Collection Time: 03/17/23 12:39 PM  Result Value Ref Range   WBC 13.1 (H) 4.0 - 10.5 K/uL   RBC 5.26 4.22 - 5.81 MIL/uL   Hemoglobin 16.3 13.0 - 17.0 g/dL   HCT 30.8 65.7 - 84.6 %   MCV 92.4 80.0 - 100.0 fL   MCH 31.0 26.0 - 34.0 pg   MCHC 33.5 30.0 - 36.0 g/dL   RDW 96.2 95.2 - 84.1 %   Platelets 195 150 - 400 K/uL   nRBC 0.0 0.0 - 0.2 %   Neutrophils Relative % 78 %   Neutro Abs 10.5 (H) 1.7 - 7.7 K/uL   Lymphocytes Relative 12 %   Lymphs Abs 1.5 0.7 - 4.0 K/uL   Monocytes Relative 7 %   Monocytes Absolute 0.9 0.1 - 1.0 K/uL   Eosinophils Relative 1 %   Eosinophils Absolute 0.1 0.0 - 0.5 K/uL   Basophils Relative 1 %   Basophils Absolute 0.1 0.0 - 0.1 K/uL   Immature Granulocytes 1 %   Abs  Immature Granulocytes 0.08 (H) 0.00 - 0.07 K/uL    Comment: Performed at [facility], [address], [place], [place] [address]  Resp panel by RT-PCR (RSV, Flu A&B, Covid) Anterior Nasal Swab     Status: None   Collection Time: 03/17/23 12:39 PM   Specimen: Anterior Nasal Swab  Result Value Ref Range   SARS Coronavirus 2 by RT PCR NEGATIVE NEGATIVE   Influenza A by PCR NEGATIVE NEGATIVE   Influenza B by PCR NEGATIVE NEGATIVE    Comment: (NOTE) The Xpert Xpress SARS-CoV-2/FLU/RSV plus assay is intended as an aid in the diagnosis of influenza from Nasopharyngeal swab specimens and should not be used as a sole basis for treatment. Nasal washings and aspirates are unacceptable for Xpert Xpress SARS-CoV-2/FLU/RSV testing.  Fact Sheet for Patients: BloggerCourse.com  Fact Sheet for Healthcare Providers: SeriousBroker.it  This test is not yet approved or cleared by the [place] FDA and has been authorized for detection and/or diagnosis of SARS-CoV-2 by FDA under an Emergency Use Authorization (EUA). This EUA will remain in effect (meaning this test can be used) for the duration of the COVID-19 declaration under Section 564(b)(1) of the Act, 21 U.S.C. section 360bbb-3(b)(1), unless the authorization is terminated or revoked.     Resp Syncytial Virus by PCR NEGATIVE NEGATIVE    Comment: (NOTE) Fact Sheet for Patients: BloggerCourse.com  Fact Sheet for Healthcare Providers: SeriousBroker.it  This test is not yet approved or cleared by the [place] FDA and has been authorized for detection and/or diagnosis of SARS-CoV-2 by FDA under an Emergency Use Authorization (EUA). This EUA will remain in effect (meaning this test can be used) for the duration of the COVID-19 declaration under Section 564(b)(1) of the Act, 21 U.S.C. section 360bbb-3(b)(1), unless the authorization  is terminated or revoked.  Performed at [facility], [address], [place], [place] [address]   CBG monitoring, ED (  now and then every hour for 3 hours)     Status: Abnormal   Collection Time: 03/17/23  1:11 PM  Result Value Ref Range   Glucose-Capillary 238 (H) 70 - 99 mg/dL    Comment: Glucose reference range applies only to samples taken after fasting for at least 8 hours.  Comprehensive metabolic panel     Status: Abnormal   Collection Time: 03/17/23  1:20 PM  Result Value Ref Range   Sodium 133 (L) 135 - 145 mmol/L   Potassium 4.4 3.5 - 5.1 mmol/L   Chloride 99 98 - 111 mmol/L   CO2 21 (L) 22 - 32 mmol/L   Glucose, Bld 208 (H) 70 - 99 mg/dL    Comment: Glucose reference range applies only to samples taken after fasting for at least 8 hours.   BUN 35 (H) 8 - 23 mg/dL   Creatinine, Ser 2.13 (H) 0.61 - 1.24 mg/dL   Calcium 9.1 8.9 - 08.6 mg/dL   Total Protein 6.9 6.5 - 8.1 g/dL   Albumin 3.8 3.5 - 5.0 g/dL   AST 37 15 - 41 U/L   ALT 26 0 - 44 U/L   Alkaline Phosphatase 106 38 - 126 U/L   Total Bilirubin 1.3 (H) <1.2 mg/dL   GFR, Estimated 54 (L) >60 mL/min    Comment: (NOTE) Calculated using the CKD-EPI Creatinine Equation (2021)    Anion gap 13 5 - 15    Comment: Performed at [facility], [address], [place], [place] [address]  Troponin I (High Sensitivity)     Status: None   Collection Time: 03/17/23  1:20 PM  Result Value Ref Range   Troponin I (High Sensitivity) 13 <18 ng/L    Comment: (NOTE) Elevated high sensitivity troponin I (hsTnI) values and significant  changes across serial measurements may suggest ACS but many other  chronic and acute conditions are known to elevate hsTnI results.  Refer to the "Links" section for chest pain algorithms and additional  guidance. Performed at [facility], [address] [place], [place] [address]   CBG monitoring, ED (now and then every hour for 3 hours)     Status: Abnormal   Collection Time:  03/17/23  2:07 PM  Result Value Ref Range   Glucose-Capillary 215 (H) 70 - 99 mg/dL    Comment: Glucose reference range applies only to samples taken after fasting for at least 8 hours.  CBG monitoring, ED     Status: Abnormal   Collection Time: 03/17/23  2:51 PM  Result Value Ref Range   Glucose-Capillary 251 (H) 70 - 99 mg/dL    Comment: Glucose reference range applies only to samples taken after fasting for at least 8 hours.  Troponin I (High Sensitivity)     Status: None   Collection Time: 03/17/23  2:53 PM  Result Value Ref Range   Troponin I (High Sensitivity) 11 <18 ng/L    Comment: (NOTE) Elevated high sensitivity troponin I (hsTnI) values and significant  changes across serial measurements may suggest ACS but many other  chronic and acute conditions are known to elevate hsTnI results.  Refer to the "Links" section for chest pain algorithms and additional  guidance. Performed at [facility], [address], [place], [place] [address]   CBG monitoring, ED     Status: Abnormal   Collection Time: 03/17/23  4:16 PM  Result Value Ref Range   Glucose-Capillary 182 (H) 70 - 99  mg/dL    Comment: Glucose reference range applies only to samples taken after fasting for at least 8 hours.  CBG monitoring, ED     Status: Abnormal   Collection Time: 03/17/23  5:20 PM  Result Value Ref Range   Glucose-Capillary 122 (H) 70 - 99 mg/dL    Comment: Glucose reference range applies only to samples taken after fasting for at least 8 hours.   CT Chest W Contrast  Result Date: 03/17/2023 CLINICAL DATA:  Respiratory illness. EXAM: CT CHEST WITH CONTRAST TECHNIQUE: Multidetector CT imaging of the chest was performed during intravenous contrast administration. RADIATION DOSE REDUCTION: This exam was performed according to the departmental dose-optimization program which includes automated exposure control, adjustment of the mA and/or kV according to patient size and/or use of iterative  reconstruction technique. CONTRAST:  75mL OMNIPAQUE IOHEXOL 350 MG/ML SOLN COMPARISON:  Chest x-ray earlier 03/17/2023.  CT 01/09/2021. FINDINGS: Cardiovascular: Status post median sternotomy. Heart is nonenlarged. No pericardial effusion. Coronary artery calcifications are seen. Thoracic aorta has a normal course and caliber. Scattered partially calcified irregular plaque identified. There is a bovine type aortic arch, a normal variant. Plaque also seen along the great vessels. Mediastinum/Nodes: Normal caliber thoracic esophagus. Preserved thyroid gland. No specific abnormal lymph node enlargement identified in the axillary regions or hilum. Few small less than 1 cm size in short axis mediastinal nodes are seen, nonpathologic by size criteria and similar distribution to the prior CT scan Lungs/Pleura: There is some linear opacity seen along bases likely scar or atelectasis. No consolidation, pneumothorax or effusion. Areas of interstitial septal thickening as well 2 mm nodule right lung on series 7, image 76. Unchanged from previous demonstrating over 2 years of stability. No specific imaging follow up centrilobular emphysematous lung changes identified. Upper Abdomen: Fatty liver infiltration with a nodular contour. Please correlate with any evidence of chronic liver disease. There are some varices in the upper abdomen. Adrenal glands are preserved. There is punctate areas of macroscopic fat in the right adrenal gland. Myelolipoma. Question slight fold thickening along the visualized portions of the stomach. Please correlate with any symptoms. Musculoskeletal: Curvature and degenerative changes of the spine. Fixation hardware along the lower cervical spine at the edge of the imaging field. IMPRESSION: Postop chest. Basilar atelectasis with some areas of scarring and fibrotic changes. No consolidation or effusion. Nodular fatty liver. Please correlate for any evidence of chronic liver disease. There is also some  varices. Slight wall thickening of the gastric folds. Please correlate with any symptoms. Aortic Atherosclerosis (ICD10-I70.0) and Emphysema (ICD10-J43.9). Electronically Signed   By: Karen Kays M.D.   On: 03/17/2023 17:03   DG Chest 2 View  Result Date: 03/17/2023 CLINICAL DATA:  Shortness of breath and cough EXAM: CHEST - 2 VIEW COMPARISON:  04/15/2021 x-ray FINDINGS: No consolidation, pneumothorax or effusion. No edema. Normal cardiopericardial silhouette. Sternal wires. Overlapping cardiac leads. Fixation hardware along the lower cervical spine. Advanced degenerative changes of the left shoulder. Presumed coronary stents. Degenerative changes of the spine on lateral view. IMPRESSION: Postop chest.  No acute cardiopulmonary disease. Electronically Signed   By: Karen Kays M.D.   On: 03/17/2023 13:24    Pending Labs Unresulted Labs (From admission, onward)     Start     Ordered   03/17/23 1226  Urinalysis, Routine w reflex microscopic -Urine, Clean Catch  Once,   URGENT       Question:  Specimen Source  Answer:  Urine, Clean Catch   03/17/23  1226            Vitals/Pain Today's Vitals   03/17/23 1224 03/17/23 1330 03/17/23 1454 03/17/23 1632  BP: (!) 125/48 134/73  129/78  Pulse: (!) 57 71  66  Resp: 18 17  (!) 21  Temp:   97.9 F (36.6 C) (!) 97.5 F (36.4 C)  TempSrc:   Oral Oral  SpO2: 99% 100%  97%  Weight:      Height:      PainSc:    7     Isolation Precautions No active isolations  Medications Medications  sodium chloride flush (NS) 0.9 % injection 3 mL (3 mLs Intravenous Given 03/17/23 1239)  sodium chloride flush (NS) 0.9 % injection 3 mL (has no administration in time range)  0.9 %  sodium chloride infusion (has no administration in time range)  enoxaparin (LOVENOX) injection 40 mg (has no administration in time range)  acetaminophen (TYLENOL) tablet 650 mg (has no administration in time range)    Or  acetaminophen (TYLENOL) suppository 650 mg (has no  administration in time range)  senna-docusate (Senokot-S) tablet 1 tablet (has no administration in time range)  ondansetron (ZOFRAN) tablet 4 mg (has no administration in time range)    Or  ondansetron (ZOFRAN) injection 4 mg (has no administration in time range)  dextrose 50 % solution 50 mL (50 mLs Intravenous Given 03/17/23 1218)  lactated ringers bolus 1,000 mL (1,000 mLs Intravenous New Bag/Given 03/17/23 1253)  cefTRIAXone (ROCEPHIN) 1 g in sodium chloride 0.9 % 100 mL IVPB (0 g Intravenous Stopped 03/17/23 1652)  azithromycin (ZITHROMAX) 500 mg in sodium chloride 0.9 % 250 mL IVPB (0 mg Intravenous Stopped 03/17/23 1729)  iohexol (OMNIPAQUE) 350 MG/ML injection 75 mL (75 mLs Intravenous Contrast Given 03/17/23 1647)    Mobility walks with device     Focused Assessments Pulmonary Assessment Handoff:  Lung sounds:   O2 Device: Room Air      R Recommendations: See Admitting Provider Note  Report given to:   Additional Notes:

## 2023-03-17 NOTE — H&P (Incomplete)
History and Physical    Patient: Paul [name] UVO:[id number] DOB: 1951/02/15 DOA: 03/17/2023 DOS: the patient was seen and examined on 03/17/2023 PCP: Tollie Eth, NP  Patient coming from: Home  Chief Complaint:  Chief Complaint  Patient presents with  . Fatigue   HPI: [name] is a 72 y.o. male with medical history significant of CAD, CHF, COPD, HLD, DDD, HTN, cirrhosis, GERD and T2DM who presented to the ED for evaluation of generalized weakness and fatigue.  Patient reports that since he had his left hand surgery on 11/19, he has not felt very well.  He has some baseline fatigue and dyspnea on exertion but over the last 2 days, he has not been eating due to nausea and abdominal bloating.  States he he has been taking 40 units of his long-acting insulin as well as 14 units of his short acting even though he has not been eating much.  States he sometimes takes his insulin to see if it would make him feel better. He has had some dry cough but no shortness of breath, fevers, chills, diarrhea, abdominal pain or dizziness.  EMS found patient to be hypoglycemic with CBG of 65 and he was given 15 g of oral glucose with improvement to 118.  ED course: Normal vitals Labs show WBC 13.1, Hgb 16.3, sodium 133, K+ 4.4, creatinine 1.37, troponin 13, negative COVID and flu test. CXR with no acute cardiopulmonary disease CT chest shows bilateral atelectasis and some areas of scarring and fibrotic changes but no consolidation or effusion. Patient received IV Rocephin and azithromycin for presumed pneumonia. Patient had hypoglycemic episodes in the ER with CBGs in the 50s requiring 1 amp of D50. TR[facility] was consulted for admission  Review of Systems: As mentioned in the history of present illness. All other systems reviewed and are negative. Past Medical History:  Diagnosis Date  . Angina, class III (HCC)   . Anxiety   . Arthritis   . Bilateral carotid artery stenosis   . BPH without  obstruction/lower urinary tract symptoms   . CAD 11/24/2011   a) Mild-to-moderate 30-40% lesions in the RCA, LAD and Circumflex. b) CULPRIT LESION: long tubular 70-80% lesion in D1 with FFR of 0.7 --> PCI w/ Xience Xpedition DES 2.5 mm x 30 mm (2.65 MM); c) Lexiscan Myoview 11/2013: No Ischemia or Infarct (Inferior Gut Attenuation) EF 63%.  . Chronic fatigue syndrome 06/13/2021  . Chronic heart failure (HCC)   . Chronic kidney disease (CKD), stage III (moderate) (HCC)   . Chronic pain syndrome   . Cigarette smoker 02/22/2014  . Cirrhosis (HCC)   . Constipation by delayed colonic transit 06/13/2021  . COPD (chronic obstructive pulmonary disease) (HCC)    "don't have full case of it; I'm right there at it"  . Depression   . Diabetes mellitus without complication (HCC)   . Diverticulosis   . Dysphagia   . Elevated alkaline phosphatase level 08/17/2022  . Elevated levels of transaminase & lactic acid dehydrogenase 06/09/2016  . Essential hypertension 11/24/2011  . Exertional dyspnea, chronic   . Fatigue 12/08/2021  . Former smoker   . GERD (gastroesophageal reflux disease)   . Gout   . H/O: GI bleed   . Hearing loss   . Hyperglycemia 03/11/2019  . Hyperlipidemia with target LDL less than 70 11/24/2011  . Hypo-osmolality and hyponatremia 03/11/2019  . Iron deficiency anemia   . Lipoprotein deficiency disorder   . Long term (current) use of insulin (HCC) 07/15/2019  .  Obesity (BMI 30.0-34.9)   . OSA (obstructive sleep apnea), uses oxygen at home did not tolerate cpap 11/24/2011  . Pain due to onychomycosis of toenails of both feet 08/23/2022  . Pain in both lower extremities 02/14/2022  . Paresthesia of skin 02/17/2015  . Peripheral vascular disease (HCC)   . Portal hypertension (HCC)   . Right hip pain 06/13/2021  . S/P CABG (coronary artery bypass graft)   . Shoulder joint pain 02/17/2015  . Spinal stenosis   . Splenomegaly   . Thrombocytopenia (HCC)   . Vitamin D deficiency     Past Surgical History:  Procedure Laterality Date  . CERVICAL SPINE SURGERY  2012  . CORONARY ANGIOPLASTY WITH STENT PLACEMENT  11/23/2011   "1; first one"  . CORONARY ARTERY BYPASS GRAFT N/A 01/25/2021   Procedure: CORONARY ARTERY BYPASS GRAFTING (CABG) X2, USING LEFT INTERNAL MAMMARY ARTERY AND LEFT LEG GREATER SAPHENOUS VEIN HARVESTED ENDOSCOPICALLY;  Surgeon: Corliss Skains, MD;  Location: [facility] OR;  Service: Open Heart Surgery;  Laterality: N/A;  . ENDOVEIN HARVEST OF GREATER SAPHENOUS VEIN Bilateral 01/25/2021   Procedure: ENDOVEIN HARVEST OF GREATER SAPHENOUS VEIN;  Surgeon: Corliss Skains, MD;  Location: [facility] OR;  Service: Open Heart Surgery;  Laterality: Bilateral;  . LEFT HEART CATH AND CORONARY ANGIOGRAPHY N/A 12/28/2020   Procedure: LEFT HEART CATH AND CORONARY ANGIOGRAPHY;  Surgeon: Marykay Lex, MD;  Location: [facility] INVASIVE CV LAB;  Service: Cardiovascular;  Laterality: N/A;  . LEFT HEART CATHETERIZATION WITH CORONARY ANGIOGRAM N/A 11/23/2011   Procedure: LEFT HEART CATHETERIZATION WITH CORONARY ANGIOGRAM;  Surgeon: Marykay Lex, MD;  Location: [facility] CATH LAB;  Service: Cardiovascular;  Laterality: N/A;  . LESION REMOVAL Left 03/06/2023   Procedure: Excision of skin lesion, left hand;  Surgeon: Santiago Glad, MD;  Location: [facility];  Service: Plastics;  Laterality: Left;  . TEE WITHOUT CARDIOVERSION N/A 01/25/2021   Procedure: TRANSESOPHAGEAL ECHOCARDIOGRAM (TEE);  Surgeon: Corliss Skains, MD;  [facility] OR;  Service: Open Heart Surgery;  Laterality: N/A;   Social History:  reports that he quit smoking about 6 years ago. His smoking use included cigarettes. He started smoking about 46 years ago. He has a 40 pack-year smoking history. He has been exposed to tobacco smoke. He has never used smokeless tobacco. He reports current alcohol use. He reports that he does not use drugs.  Allergies  Allergen Reactions  . Morphine And Codeine Itching     Possible itching due to morphine 01/26/21  . Cortisone     Due to diabetes  . Other Other (See Comments)    Cats- eyes burn    Family History  Problem Relation Age of Onset  . Heart disease Sister   . Heart attack Brother   . Emphysema Father        smoked  . Aneurysm Father     Prior to Admission medications   Medication Sig Start Date End Date Taking? Authorizing Provider  acetaminophen (TYLENOL) 500 MG tablet Take 1,000 mg by mouth every 8 (eight) hours as needed for moderate pain (pain score 4-6).   Yes [provider]  aspirin 81 MG EC tablet Take 1 tablet (81 mg total) by mouth daily. 02/18/21  Yes   gabapentin (NEURONTIN) 300 MG capsule Take 2 capsules (600 mg total) by mouth 3 (three) times daily. For back and foot pain. 04/20/22  Yes Early, Sung Amabile, NP  HUMALOG KWIKPEN 200 UNIT/ML KwikPen INJECT 14 UNITS UNDER  THE SKIN EVERY MORNING, 14 UNITS WITH LUNCH AND 14 UNITS WITH SUPPER, DAILY Patient taking differently: 35 Units See admin instructions. Take 35 units with a meal once or twice a day. 02/20/23  Yes Early, Sung Amabile, NP  HYDROcodone-acetaminophen (NORCO/VICODIN) 5-325 MG tablet Take 1-2 tablets by mouth in the morning AND 1 tablet daily at 12 noon AND 1 tablet at bedtime. 02/28/23  Yes   insulin glargine, 1 Unit Dial, (TOUJEO SOLOSTAR) 300 UNIT/ML Solostar Pen Inject 40 Units into the skin daily. 04/23/22  Yes Early, Sung Amabile, NP  metFORMIN (GLUCOPHAGE-XR) 500 MG 24 hr tablet Take 2 tablets (1,000 mg total) by mouth 2 (two) times daily. 04/20/22  Yes Early, Sung Amabile, NP  umeclidinium-vilanterol (ANORO ELLIPTA) 62.5-25 MCG/ACT AEPB Inhale 1 puff into the lungs daily. 04/20/22  Yes Early, Sung Amabile, NP  zolpidem (AMBIEN) 10 MG tablet Take 1 tablet (10 mg total) by mouth at bedtime as needed for sleep 01/11/23  Yes Early, Sung Amabile, NP  albuterol (VENTOLIN HFA) 108 (90 Base) MCG/ACT inhaler Inhale 2 puffs into the lungs every 4 (four) hours as needed. For breathing. Patient not taking:  Reported on 03/17/2023 06/13/21   Early, Sung Amabile, NP  allopurinol (ZYLOPRIM) 100 MG tablet Take 1 tablet (100 mg total) by mouth daily. Patient not taking: Reported on 03/17/2023 04/20/22   Early, Sung Amabile, NP  amLODipine (NORVASC) 5 MG tablet Take 1 tablet (5 mg total) by mouth daily. Patient not taking: Reported on 02/16/2023 05/11/22   Early, Sung Amabile, NP  celecoxib (CELEBREX) 100 MG capsule Take 1 capsule (100 mg total) by mouth 2 (two) times daily. 11/29/22   Tollie Eth, NP  Continuous Glucose Receiver (FREESTYLE LIBRE 3 READER) DEVI Use with freestyle libre 3 sensor 12/08/22   Tysinger, [name], PA-C  Continuous Glucose Sensor (FREESTYLE LIBRE 3 SENSOR) MISC  08/20/22   [provider]  DULoxetine (CYMBALTA) 20 MG capsule Take 1 capsule (20 mg total) by mouth daily for 7 days, THEN 2 capsules (40 mg total) daily. For pain management.. 01/25/23 04/25/23  Tollie Eth, NP  glucose blood (ONETOUCH VERIO) test strip Use to check blood glucose 3 times daily 12/27/20     Insulin Pen Needle 32G X 4 MM MISC Use daily with insulin as directed 07/11/22   Early, Sung Amabile, NP  Iron, Ferrous Sulfate, 325 (65 Fe) MG TABS Take 1 tablet (325 mg) by mouth daily. Patient not taking: Reported on 03/17/2023 03/08/22   Ulysees Barns IV, MD  Lidocaine (ZTLIDO) 1.8 % PTCH Apply 1 patch topically daily. (May wear up to 12hours.) Patient not taking: Reported on 02/16/2023 12/27/22     metoprolol tartrate (LOPRESSOR) 25 MG tablet Take 1 tablet (25 mg total) by mouth 2 (two) times daily. 04/20/22   Tollie Eth, NP  pantoprazole (PROTONIX) 40 MG tablet Take 1 tablet (40 mg total) by mouth daily. 04/20/22   Tollie Eth, NP  potassium chloride (KLOR-CON M) 10 MEQ tablet Take 1 tablet (10 mEq total) by mouth daily. 10/20/22   Swinyer, Zachary George, NP  rosuvastatin (CRESTOR) 20 MG tablet Take 1 tablet (20 mg total) by mouth daily. Patient not taking: Reported on 03/17/2023 10/20/22   [place], [name], MD    Physical Exam: Vitals:    03/17/23 1900 03/17/23 1915 03/17/23 1930 03/17/23 2024  BP: (!) 145/77 120/83 132/75 (!) 140/72  Pulse: 73 71 72 73  Resp: 20 16 18 14   Temp:  98.6 F (37 C)  TempSrc:    Oral  SpO2: 95% 94% 95% 96%  Weight:      Height:       General: Pleasant, chronically ill-appearing elderly man laying in bed. No acute distress. HEENT: [place]/AT. Anicteric sclera CV: RRR. No murmurs, rubs, or gallops. No LE edema Pulmonary: Lungs CTAB. Normal effort.  Fine crackles at the bases.  No wheezing or rhonchi. Abdominal: Soft, nontender, nondistended. Normal bowel sounds. Extremities: Palpable radial and DP pulses. Normal ROM. Skin: Warm and dry. No obvious rash or lesions. Neuro: A&Ox3. Moves all extremities. Normal sensation to light touch. No focal deficit. Psych: Normal mood and affect  Data Reviewed: {Tip this will not be part of the note when signed- Document your independent interpretation of telemetry tracing, EKG, lab, Radiology test or any other diagnostic tests. Add any new diagnostic test ordered today. (Optional):[id number]} WBC 13.1, Hgb 16.3, sodium 133, K+ 4.4, creatinine 1.37, troponin 13, negative COVID and flu test. UA with no evidence of infection. CXR with no acute cardiopulmonary disease CT chest shows bilateral atelectasis and some areas of scarring and fibrotic changes but no consolidation or effusion.  Assessment and Plan: Rc Schiraldi is a 72 y.o. male with medical history significant of CAD, CHF, COPD, HLD, DDD, HTN, cirrhosis, GERD and T2DM who presented to the ED for evaluation of generalized weakness and fatigue found to be hypoglycemic on admission and admitted for generalized weakness.  # Generalized weakness # Generalized deconditioning # Hypoglycemia Elderly patient with multiple medical problems and some memory deficits presented with generalized weakness that has worsened over the last 3 days.  He has had decreased p.o. intake but continues to take his home insulin leading  to likely hypoglycemic episodes at home. Workup for infectious cause has been negative so far with negative checks x-ray, CT chest and urinalysis. Labs are negative for metabolic derangement. S/p 1 L IV LR bolus -Admit for observation -Hold home insulin - -Registered dietitian consult, appreciate recs -PT/OT  # Leukocytosis Patient with mild leukocytosis, WBC of 13 on admission. He does endorse some dry cough but denies any fever, chills, or shortness of breath.  States his urine is dark but denies any dysuria.  There is no evidence of pneumonia on imaging and no evidence of infection on UA.  Leukocytosis likely reactive. S/p IV Rocephin and azithromycin for presumed pneumonia in the ED.  Will hold off on further antibiotics and monitor closely. -Trend CBC, fever curve -Follow-up procalcitonin  # T2DM A1c 7.1% 2 months ago. Home regimen includes insulin glargine 40 units daily and Humalog 14 units 3 times daily with meals. However patient states he takes 35 units with a meal once or twice a day.  Patient likely injecting more insulin than needed at home leading to hypoglycemic episodes and generalized weakness. -Hold long-acting insulin -Metformin 1000 mg twice daily -SSI with meals, every 4 hours CBG monitoring  # COPD No respiratory distress or wheezing on exam. -Continue Anoro Ellipta daily   Advance Care Planning:   Code Status: Full Code   Consults: None  Family Communication: No family at bedside.  Severity of Illness: The appropriate patient status for this patient is OBSERVATION. Observation status is judged to be reasonable and necessary in order to provide the required intensity of service to ensure the patient's safety. The patient's presenting symptoms, physical exam findings, and initial radiographic and laboratory data in the context of their medical condition is felt to place them at decreased risk  for further clinical deterioration. Furthermore, it is anticipated that the  patient will be medically stable for discharge from the hospital within 2 midnights of admission.   Author: Steffanie Rainwater, MD 03/17/2023 8:54 PM  For on call review [email or web address].

## 2023-03-17 NOTE — H&P (Signed)
History and Physical    Patient: Paul [name] ZOX:[id number] DOB: [birth year] DOA: 03/17/2023 DOS: the patient was seen and examined on 03/17/2023 PCP: Tollie Eth, NP  Patient coming from: Home  Chief Complaint:  Chief Complaint  Patient presents with   Fatigue   HPI: Paul [name] is a 72 y.o. male with medical history significant of CAD, CHF, COPD, HLD, DDD, HTN, cirrhosis, GERD and T2DM who presented to the ED for evaluation of generalized weakness and fatigue.  Patient reports that since he had his left hand surgery on 11/19, he has not felt very well.  He has some baseline fatigue and dyspnea on exertion but over the last 2 days, he has not been eating due to nausea and abdominal bloating.  States he he has been taking 40 units of his long-acting insulin as well as 14 units of his short acting even though he has not been eating much.  States he sometimes takes his insulin to see if it would make him feel better. He has had some dry cough but no shortness of breath, fevers, chills, diarrhea, abdominal pain or dizziness.  EMS found patient to be hypoglycemic with CBG of 65 and he was given 15 g of oral glucose with improvement to 118.  ED course: Normal vitals Labs show WBC 13.1, Hgb 16.3, sodium 133, K+ 4.4, creatinine 1.37, troponin 13, negative COVID and flu test. CXR with no acute cardiopulmonary disease CT chest shows bilateral atelectasis and some areas of scarring and fibrotic changes but no consolidation or effusion. Patient received IV Rocephin and azithromycin for presumed pneumonia. Patient had hypoglycemic episodes in the ER with CBGs in the 50s requiring 1 amp of D50. TR[facility] was consulted for admission  Review of Systems: As mentioned in the history of present illness. All other systems reviewed and are negative. Past Medical History:  Diagnosis Date   Angina, class III (HCC)    Anxiety    Arthritis    Bilateral carotid artery stenosis    BPH without obstruction/lower  urinary tract symptoms    CAD 11/24/2011   a) Mild-to-moderate 30-40% lesions in the RCA, LAD and Circumflex. b) CULPRIT LESION: long tubular 70-80% lesion in D1 with FFR of 0.7 --> PCI w/ Xience Xpedition DES 2.5 mm x 30 mm (2.65 MM); c) Lexiscan Myoview 11/2013: No Ischemia or Infarct (Inferior Gut Attenuation) EF 63%.   Chronic fatigue syndrome 06/13/2021   Chronic heart failure (HCC)    Chronic kidney disease (CKD), stage III (moderate) (HCC)    Chronic pain syndrome    Cigarette smoker 02/22/2014   Cirrhosis (HCC)    Constipation by delayed colonic transit 06/13/2021   COPD (chronic obstructive pulmonary disease) (HCC)    "don't have full case of it; I'm right there at it"   Depression    Diabetes mellitus without complication (HCC)    Diverticulosis    Dysphagia    Elevated alkaline phosphatase level 08/17/2022   Elevated levels of transaminase & lactic acid dehydrogenase 06/09/2016   Essential hypertension 11/24/2011   Exertional dyspnea, chronic    Fatigue 12/08/2021   Former smoker    GERD (gastroesophageal reflux disease)    Gout    H/O: GI bleed    Hearing loss    Hyperglycemia 03/11/2019   Hyperlipidemia with target LDL less than 70 11/24/2011   Hypo-osmolality and hyponatremia 03/11/2019   Iron deficiency anemia    Lipoprotein deficiency disorder    Long term (current) use of insulin (HCC) 07/15/2019  Obesity (BMI 30.0-34.9)    OSA (obstructive sleep apnea), uses oxygen at home did not tolerate cpap 11/24/2011   Pain due to onychomycosis of toenails of both feet 08/23/2022   Pain in both lower extremities 02/14/2022   Paresthesia of skin 02/17/2015   Peripheral vascular disease (HCC)    Portal hypertension (HCC)    Right hip pain 06/13/2021   S/P CABG (coronary artery bypass graft)    Shoulder joint pain 02/17/2015   Spinal stenosis    Splenomegaly    Thrombocytopenia (HCC)    Vitamin D deficiency    Past Surgical History:  Procedure Laterality Date    CERVICAL SPINE SURGERY  2012   CORONARY ANGIOPLASTY WITH STENT PLACEMENT  11/23/2011   "1; first one"   CORONARY ARTERY BYPASS GRAFT N/A 01/25/2021   Procedure: CORONARY ARTERY BYPASS GRAFTING (CABG) X2, USING LEFT INTERNAL MAMMARY ARTERY AND LEFT LEG GREATER SAPHENOUS VEIN HARVESTED ENDOSCOPICALLY;  Surgeon: Corliss Skains, MD;  Location: [facility] OR;  Service: Open Heart Surgery;  Laterality: N/A;   ENDOVEIN HARVEST OF GREATER SAPHENOUS VEIN Bilateral 01/25/2021   Procedure: ENDOVEIN HARVEST OF GREATER SAPHENOUS VEIN;  Surgeon: Corliss Skains, MD;  Location: [facility] OR;  Service: Open Heart Surgery;  Laterality: Bilateral;   LEFT HEART CATH AND CORONARY ANGIOGRAPHY N/A 12/28/2020   Procedure: LEFT HEART CATH AND CORONARY ANGIOGRAPHY;  Surgeon: Marykay Lex, MD;  Location: [facility] INVASIVE CV LAB;  Service: Cardiovascular;  Laterality: N/A;   LEFT HEART CATHETERIZATION WITH CORONARY ANGIOGRAM N/A 11/23/2011   Procedure: LEFT HEART CATHETERIZATION WITH CORONARY ANGIOGRAM;  Surgeon: Marykay Lex, MD;  Location: [facility] CATH LAB;  Service: Cardiovascular;  Laterality: N/A;   LESION REMOVAL Left 03/06/2023   Procedure: Excision of skin lesion, left hand;  Surgeon: Santiago Glad, MD;  Location: [facility];  Service: Plastics;  Laterality: Left;   TEE WITHOUT CARDIOVERSION N/A 01/25/2021   Procedure: TRANSESOPHAGEAL ECHOCARDIOGRAM (TEE);  Surgeon: Corliss Skains, MD;  Location: [facility] OR;  Service: Open Heart Surgery;  Laterality: N/A;   Social History:  reports that he quit smoking about 6 years ago. His smoking use included cigarettes. He started smoking about 46 years ago. He has a 40 pack-year smoking history. He has been exposed to tobacco smoke. He has never used smokeless tobacco. He reports current alcohol use. He reports that he does not use drugs.  Allergies  Allergen Reactions   Morphine And Codeine Itching    Possible itching due to morphine 01/26/21   Cortisone     Due to  diabetes   Other Other (See Comments)    Cats- eyes burn    Family History  Problem Relation Age of Onset   Heart disease Sister    Heart attack Brother    Emphysema Father        smoked   Aneurysm Father     Prior to Admission medications   Medication Sig Start Date End Date Taking? Authorizing Provider  acetaminophen (TYLENOL) 500 MG tablet Take 1,000 mg by mouth every 8 (eight) hours as needed for moderate pain (pain score 4-6).   Yes [provider]  aspirin 81 MG EC tablet Take 1 tablet (81 mg total) by mouth daily. 02/18/21  Yes   gabapentin (NEURONTIN) 300 MG capsule Take 2 capsules (600 mg total) by mouth 3 (three) times daily. For back and foot pain. 04/20/22  Yes Early, Sung Amabile, NP  HUMALOG KWIKPEN 200 UNIT/ML KwikPen INJECT 14 UNITS UNDER  THE SKIN EVERY MORNING, 14 UNITS WITH LUNCH AND 14 UNITS WITH SUPPER, DAILY Patient taking differently: 35 Units See admin instructions. Take 35 units with a meal once or twice a day. 02/20/23  Yes Early, Sung Amabile, NP  HYDROcodone-acetaminophen (NORCO/VICODIN) 5-325 MG tablet Take 1-2 tablets by mouth in the morning AND 1 tablet daily at 12 noon AND 1 tablet at bedtime. 02/28/23  Yes   insulin glargine, 1 Unit Dial, (TOUJEO SOLOSTAR) 300 UNIT/ML Solostar Pen Inject 40 Units into the skin daily. 04/23/22  Yes Early, Sung Amabile, NP  metFORMIN (GLUCOPHAGE-XR) 500 MG 24 hr tablet Take 2 tablets (1,000 mg total) by mouth 2 (two) times daily. 04/20/22  Yes Early, Sung Amabile, NP  umeclidinium-vilanterol (ANORO ELLIPTA) 62.5-25 MCG/ACT AEPB Inhale 1 puff into the lungs daily. 04/20/22  Yes Early, Sung Amabile, NP  zolpidem (AMBIEN) 10 MG tablet Take 1 tablet (10 mg total) by mouth at bedtime as needed for sleep 01/11/23  Yes Early, Sung Amabile, NP  albuterol (VENTOLIN HFA) 108 (90 Base) MCG/ACT inhaler Inhale 2 puffs into the lungs every 4 (four) hours as needed. For breathing. Patient not taking: Reported on 03/17/2023 06/13/21   Early, Sung Amabile, NP  allopurinol (ZYLOPRIM) 100  MG tablet Take 1 tablet (100 mg total) by mouth daily. Patient not taking: Reported on 03/17/2023 04/20/22   Early, Sung Amabile, NP  amLODipine (NORVASC) 5 MG tablet Take 1 tablet (5 mg total) by mouth daily. Patient not taking: Reported on 02/16/2023 05/11/22   Early, Sung Amabile, NP  celecoxib (CELEBREX) 100 MG capsule Take 1 capsule (100 mg total) by mouth 2 (two) times daily. 11/29/22   Tollie Eth, NP  Continuous Glucose Receiver (FREESTYLE LIBRE 3 READER) DEVI Use with freestyle libre 3 sensor 12/08/22   Tysinger, [name], PA-C  Continuous Glucose Sensor (FREESTYLE LIBRE 3 SENSOR) MISC  08/20/22   [provider]  DULoxetine (CYMBALTA) 20 MG capsule Take 1 capsule (20 mg total) by mouth daily for 7 days, THEN 2 capsules (40 mg total) daily. For pain management.. 01/25/23 04/25/23  Tollie Eth, NP  glucose blood (ONETOUCH VERIO) test strip Use to check blood glucose 3 times daily 12/27/20     Insulin Pen Needle 32G X 4 MM MISC Use daily with insulin as directed 07/11/22   Early, Sung Amabile, NP  Iron, Ferrous Sulfate, 325 (65 Fe) MG TABS Take 1 tablet (325 mg) by mouth daily. Patient not taking: Reported on 03/17/2023 03/08/22   Ulysees Barns IV, MD  Lidocaine (ZTLIDO) 1.8 % PTCH Apply 1 patch topically daily. (May wear up to 12hours.) Patient not taking: Reported on 02/16/2023 12/27/22     metoprolol tartrate (LOPRESSOR) 25 MG tablet Take 1 tablet (25 mg total) by mouth 2 (two) times daily. 04/20/22   Tollie Eth, NP  pantoprazole (PROTONIX) 40 MG tablet Take 1 tablet (40 mg total) by mouth daily. 04/20/22   Tollie Eth, NP  potassium chloride (KLOR-CON M) 10 MEQ tablet Take 1 tablet (10 mEq total) by mouth daily. 10/20/22   Swinyer, Zachary George, NP  rosuvastatin (CRESTOR) 20 MG tablet Take 1 tablet (20 mg total) by mouth daily. Patient not taking: Reported on 03/17/2023 10/20/22   [place], [name], MD    Physical Exam: Vitals:   03/17/23 1900 03/17/23 1915 03/17/23 1930 03/17/23 2024  BP: (!) 145/77 120/83  132/75 (!) 140/72  Pulse: 73 71 72 73  Resp: 20 16 18 14   Temp:  98.6 F (37 C)  TempSrc:    Oral  SpO2: 95% 94% 95% 96%  Weight:      Height:       General: Pleasant, chronically ill-appearing elderly man laying in bed. No acute distress. HEENT: [place]/AT. Anicteric sclera CV: RRR. No murmurs, rubs, or gallops. No LE edema Pulmonary: Lungs CTAB. Normal effort.  Fine crackles at the bases.  No wheezing or rhonchi. Abdominal: Soft, nontender, nondistended. Normal bowel sounds. Extremities: Palpable radial and DP pulses. Normal ROM. Skin: Warm and dry. No obvious rash or lesions. Neuro: A&Ox3. Moves all extremities. Normal sensation to light touch. No focal deficit. Psych: Normal mood and affect  Data Reviewed:  WBC 13.1, Hgb 16.3, sodium 133, K+ 4.4, creatinine 1.37, troponin 13, negative COVID and flu test. UA with no evidence of infection. CXR with no acute cardiopulmonary disease CT chest shows bilateral atelectasis and some areas of scarring and fibrotic changes but no consolidation or effusion.  Assessment and Plan: Steveland Gaschler is a 72 y.o. male with medical history significant of CAD, CHF, COPD, HLD, DDD, HTN, cirrhosis, GERD and T2DM who presented to the ED for evaluation of generalized weakness and fatigue found to be hypoglycemic on admission and admitted for generalized weakness.  # Generalized weakness # Generalized deconditioning # Hypoglycemia Elderly patient with multiple medical problems and some memory deficits presented with generalized weakness that has worsened over the last 3 days.  He has had decreased p.o. intake but continues to take his home insulin leading to likely hypoglycemic episodes at home. Workup for infectious cause has been negative so far with negative checks x-ray, CT chest and urinalysis. Labs are negative for metabolic derangement. S/p 1 L IV LR bolus -Admit for observation -Hold home insulin, close monitor of CBGs every 4 hours -Ensure twice  daily between meals -Registered dietitian consult, appreciate recs -PT/OT  # Leukocytosis Patient with mild leukocytosis, WBC of 13 on admission. He does endorse some dry cough but denies any fever, chills, or shortness of breath.  States his urine is dark but denies any dysuria.  There is no evidence of pneumonia on imaging and no evidence of infection on UA.  Leukocytosis likely reactive. S/p IV Rocephin and azithromycin for presumed pneumonia in the ED.  Will hold off on further antibiotics and monitor closely. -Trend CBC, fever curve -Follow-up procalcitonin  # T2DM A1c 7.1% 2 months ago. Home regimen includes insulin glargine 40 units daily and Humalog 14 units 3 times daily with meals. However patient states he takes 35 units with a meal once or twice a day.  Patient likely injecting more insulin than needed at home leading to hypoglycemic episodes and generalized weakness. -Hold long-acting insulin -Metformin 1000 mg twice daily -SSI with meals, every 4 hours CBG monitoring  # COPD No respiratory distress or wheezing on exam. -Continue Anoro Ellipta daily  # Diastolic HF Last TTE in 04/2022 showed EF 65-70%, and G1DD.  Patient with distant crackles at the bases but looks euvolemic on exam. -Metoprolol 25 mg twice daily  # HTN BP stable with SBP in the 120s to 140s -Amlodipine 5 mg daily -Metoprolol 25 mg twice daily  # HLD -Rosuvastatin 20 mg daily  # GERD -Protonix 40 mg daily   Advance Care Planning:   Code Status: Full Code   Consults: None  Family Communication: No family at bedside.  Severity of Illness: The appropriate patient status for this patient is OBSERVATION. Observation status is judged to be reasonable and  necessary in order to provide the required intensity of service to ensure the patient's safety. The patient's presenting symptoms, physical exam findings, and initial radiographic and laboratory data in the context of their medical condition is felt to  place them at decreased risk for further clinical deterioration. Furthermore, it is anticipated that the patient will be medically stable for discharge from the hospital within 2 midnights of admission.   Author: Steffanie Rainwater, MD 03/17/2023 8:54 PM  For on call review [email or web address].

## 2023-03-17 NOTE — ED Notes (Signed)
Patient sitting up eating lunch tray at this time.

## 2023-03-17 NOTE — ED Provider Notes (Signed)
[place] EMERGENCY DEPARTMENT AT [facility] Provider Note   CSN: [id number] Arrival date & time: 03/17/23  1139     History  Chief Complaint  Patient presents with   Fatigue    Paul [name] is a 72 y.o. male, history of hypertension, COPD, diabetes, who presents to the ED secondary to weakness, for the last 3 days.  He states that for the past 3 days he has had a cough, and just felt generalized weakness.  States that his sugars have been low, and he has continued to take his metformin, and insulin.  He takes 40 units of long-acting, and then 14 units with morning, lunch, and dinner.  He states he has not been eating or drinking as well, because he has felt weak, and he is continue to take his insulin, as initially instructed.  He denies any kind of nausea, vomiting, abdominal pain.  But does states that he feels a little bit bloated, but is passing gas.  He states he is having a cough, and some shortness of breath, but has not been wheezing.  Denies any fevers or chills.  Home Medications Prior to Admission medications   Medication Sig Start Date End Date Taking? Authorizing Provider  acetaminophen (TYLENOL) 500 MG tablet Take 1,000 mg by mouth every 8 (eight) hours as needed for moderate pain (pain score 4-6).   Yes [provider]  aspirin 81 MG EC tablet Take 1 tablet (81 mg total) by mouth daily. 02/18/21  Yes   gabapentin (NEURONTIN) 300 MG capsule Take 2 capsules (600 mg total) by mouth 3 (three) times daily. For back and foot pain. 04/20/22  Yes Early, Sung Amabile, NP  HUMALOG KWIKPEN 200 UNIT/ML KwikPen INJECT 14 UNITS UNDER THE SKIN EVERY MORNING, 14 UNITS WITH LUNCH AND 14 UNITS WITH SUPPER, DAILY Patient taking differently: 35 Units See admin instructions. Take 35 units with a meal once or twice a day. 02/20/23  Yes Early, Sung Amabile, NP  HYDROcodone-acetaminophen (NORCO/VICODIN) 5-325 MG tablet Take 1-2 tablets by mouth in the morning AND 1 tablet daily at 12 noon AND 1  tablet at bedtime. 02/28/23  Yes   insulin glargine, 1 Unit Dial, (TOUJEO SOLOSTAR) 300 UNIT/ML Solostar Pen Inject 40 Units into the skin daily. 04/23/22  Yes Early, Sung Amabile, NP  metFORMIN (GLUCOPHAGE-XR) 500 MG 24 hr tablet Take 2 tablets (1,000 mg total) by mouth 2 (two) times daily. 04/20/22  Yes Early, Sung Amabile, NP  umeclidinium-vilanterol (ANORO ELLIPTA) 62.5-25 MCG/ACT AEPB Inhale 1 puff into the lungs daily. 04/20/22  Yes Early, Sung Amabile, NP  zolpidem (AMBIEN) 10 MG tablet Take 1 tablet (10 mg total) by mouth at bedtime as needed for sleep 01/11/23  Yes Early, Sung Amabile, NP  albuterol (VENTOLIN HFA) 108 (90 Base) MCG/ACT inhaler Inhale 2 puffs into the lungs every 4 (four) hours as needed. For breathing. Patient not taking: Reported on 03/17/2023 06/13/21   Early, Sung Amabile, NP  allopurinol (ZYLOPRIM) 100 MG tablet Take 1 tablet (100 mg total) by mouth daily. Patient not taking: Reported on 03/17/2023 04/20/22   Early, Sung Amabile, NP  amLODipine (NORVASC) 5 MG tablet Take 1 tablet (5 mg total) by mouth daily. Patient not taking: Reported on 02/16/2023 05/11/22   Early, Sung Amabile, NP  celecoxib (CELEBREX) 100 MG capsule Take 1 capsule (100 mg total) by mouth 2 (two) times daily. 11/29/22   Early, Sung Amabile, NP  Continuous Glucose Receiver (FREESTYLE LIBRE 3 READER) DEVI Use with  freestyle libre 3 sensor 12/08/22   Tysinger, [name], PA-C  Continuous Glucose Sensor (FREESTYLE LIBRE 3 SENSOR) MISC  08/20/22   [provider]  DULoxetine (CYMBALTA) 20 MG capsule Take 1 capsule (20 mg total) by mouth daily for 7 days, THEN 2 capsules (40 mg total) daily. For pain management.. 01/25/23 04/25/23  Tollie Eth, NP  glucose blood (ONETOUCH VERIO) test strip Use to check blood glucose 3 times daily 12/27/20     Insulin Pen Needle 32G X 4 MM MISC Use daily with insulin as directed 07/11/22   Early, Sung Amabile, NP  Iron, Ferrous Sulfate, 325 (65 Fe) MG TABS Take 1 tablet (325 mg) by mouth daily. Patient not taking: Reported on  03/17/2023 03/08/22   Ulysees Barns IV, MD  Lidocaine (ZTLIDO) 1.8 % PTCH Apply 1 patch topically daily. (May wear up to 12hours.) Patient not taking: Reported on 02/16/2023 12/27/22     metoprolol tartrate (LOPRESSOR) 25 MG tablet Take 1 tablet (25 mg total) by mouth 2 (two) times daily. 04/20/22   Tollie Eth, NP  pantoprazole (PROTONIX) 40 MG tablet Take 1 tablet (40 mg total) by mouth daily. 04/20/22   Tollie Eth, NP  potassium chloride (KLOR-CON M) 10 MEQ tablet Take 1 tablet (10 mEq total) by mouth daily. 10/20/22   Swinyer, Zachary George, NP  rosuvastatin (CRESTOR) 20 MG tablet Take 1 tablet (20 mg total) by mouth daily. Patient not taking: Reported on 03/17/2023 10/20/22   [place], [name], MD      Allergies    Morphine and codeine, Cortisone, and Other    Review of Systems   Review of Systems  Respiratory:  Positive for cough and shortness of breath.   Cardiovascular:  Negative for chest pain.    Physical Exam Updated Vital Signs BP 134/73   Pulse 71   Temp 97.9 F (36.6 C) (Oral)   Resp 17   Ht 5\' 5"  (1.651 m)   Wt 81.6 kg   SpO2 100%   BMI 29.95 kg/m  Physical Exam Vitals and nursing note reviewed.  Constitutional:      General: He is not in acute distress.    Appearance: He is well-developed.  HENT:     Head: Normocephalic and atraumatic.  Eyes:     Conjunctiva/sclera: Conjunctivae normal.  Cardiovascular:     Rate and Rhythm: Normal rate and regular rhythm.     Heart sounds: No murmur heard. Pulmonary:     Effort: Pulmonary effort is normal. No respiratory distress.     Breath sounds: Normal breath sounds.  Abdominal:     Palpations: Abdomen is soft.     Tenderness: There is no abdominal tenderness.  Musculoskeletal:        General: No swelling.     Cervical back: Neck supple.  Skin:    General: Skin is warm and dry.     Capillary Refill: Capillary refill takes less than 2 seconds.     Comments: Healing site to left hand, with no erythema, warmth, or  fluctuance.  Range of motion of the hand intact.  Neurological:     Mental Status: He is alert.  Psychiatric:        Mood and Affect: Mood normal.     ED Results / Procedures / Treatments   Labs (all labs ordered are listed, but only abnormal results are displayed) Labs Reviewed  CBC WITH DIFFERENTIAL/PLATELET - Abnormal; Notable for the following components:  Result Value   WBC 13.1 (*)    Neutro Abs 10.5 (*)    Abs Immature Granulocytes 0.08 (*)    All other components within normal limits  COMPREHENSIVE METABOLIC PANEL - Abnormal; Notable for the following components:   Sodium 133 (*)    CO2 21 (*)    Glucose, Bld 208 (*)    BUN 35 (*)    Creatinine, Ser 1.37 (*)    Total Bilirubin 1.3 (*)    GFR, Estimated 54 (*)    All other components within normal limits  CBG MONITORING, ED - Abnormal; Notable for the following components:   Glucose-Capillary 56 (*)    All other components within normal limits  CBG MONITORING, ED - Abnormal; Notable for the following components:   Glucose-Capillary 66 (*)    All other components within normal limits  CBG MONITORING, ED - Abnormal; Notable for the following components:   Glucose-Capillary 271 (*)    All other components within normal limits  CBG MONITORING, ED - Abnormal; Notable for the following components:   Glucose-Capillary 238 (*)    All other components within normal limits  CBG MONITORING, ED - Abnormal; Notable for the following components:   Glucose-Capillary 215 (*)    All other components within normal limits  CBG MONITORING, ED - Abnormal; Notable for the following components:   Glucose-Capillary 251 (*)    All other components within normal limits  RESP PANEL BY RT-PCR (RSV, FLU A&B, COVID)  [id number]  URINALYSIS, ROUTINE W REFLEX MICROSCOPIC  CBG MONITORING, ED  TROPONIN I (HIGH SENSITIVITY)  TROPONIN I (HIGH SENSITIVITY)    EKG None  Radiology DG Chest 2 View  Result Date: 03/17/2023 CLINICAL DATA:   Shortness of breath and cough EXAM: CHEST - 2 VIEW COMPARISON:  04/15/2021 x-ray FINDINGS: No consolidation, pneumothorax or effusion. No edema. Normal cardiopericardial silhouette. Sternal wires. Overlapping cardiac leads. Fixation hardware along the lower cervical spine. Advanced degenerative changes of the left shoulder. Presumed coronary stents. Degenerative changes of the spine on lateral view. IMPRESSION: Postop chest.  No acute cardiopulmonary disease. Electronically Signed   By: Karen Kays M.D.   On: 03/17/2023 13:24    Procedures Procedures    Medications Ordered in ED Medications  sodium chloride flush (NS) 0.9 % injection 3 mL (3 mLs Intravenous Given 03/17/23 1239)  sodium chloride flush (NS) 0.9 % injection 3 mL (has no administration in time range)  0.9 %  sodium chloride infusion (has no administration in time range)  dextrose 50 % solution 50 mL (50 mLs Intravenous Given 03/17/23 1218)  lactated ringers bolus 1,000 mL (1,000 mLs Intravenous New Bag/Given 03/17/23 1253)    ED Course/ Medical Decision Making/ A&P                                 Medical Decision Making Patient is a 72 year old male, here for weakness, for the last 3 days.  We will obtain a chest x-ray, COVID/flu for further evaluation because he has a cough, and some shortness of breath.  He is also hypoglycemic, has continued to use his insulin, but has not been eating.  He was hypoglycemic, when EMS saw him and was given oral glucose, with increase of his glucose to in the 70s, then dropped again while he was in the ER even after eating.  To 60s, he was get given D50, with improvement of his glucose, however concern  for possible infectious etiology, causing failure to thrive/hypoglycemia.  Will further evaluation  Amount and/or Complexity of Data Reviewed Labs: ordered.    Details: Hypoglycemic, leukocytosis Radiology: ordered.    Details: Chest x-ray clear Discussion of management or test interpretation  with external provider(s): Discussed with patient, chest x-ray is clear however concern for pneumonia, we will obtain a CT chest, for further evaluation.  His creatinine has elevated, it is around 1.5 times, not quite meeting AKI criteria, hat but he has peak poor p.o. intake, and was hypoglycemic, concern given long-acting insulin, recommend admission, after further chest CT, and urinalysis, to further evaluate etiology of possible hypoglycemia/fatigue/weakness.  Handed off to Evlyn Kanner, [place], for follow-up on labs, and CT, with pending admission  Risk Prescription drug management.  Final Clinical Impression(s) / ED Diagnoses Final diagnoses:  Hypoglycemia  Upper respiratory tract infection, unspecified type    Rx / DC Orders ED Discharge Orders     None         Nathanel Tallman, [place], PA 03/17/23 1519    Rolan Bucco, MD 03/17/23 1539

## 2023-03-17 NOTE — ED Notes (Signed)
Patient provided with and drinking orange juice at this time.

## 2023-03-17 NOTE — ED Provider Notes (Signed)
Patient given in sign out by [employer], PA-C.  Please review their note for patient HPI, physical exam, workup.  At this time the plan is plan on admission for persistent hypoglycemia and to follow-up on CT chest with contrast to evaluate for possible occult pneumonia not found on chest x-ray.  Patient's repeat troponin was negative.  CT has not been read yet by radiology however I do see signs of right lower lobe pneumonia which I do suspect may be contributing to the hyperglycemia.  Will start patient on IV antibiotic hospitalist for admission due to persistent hypoglycemia.  Patient stable for admission at this time.  Consults: Julieanne Manson, MD IM Resident  I spoke to internal medicine and they will come down to see the patient.  Patient stable for admission at this time.   Netta Corrigan, PA-C 03/17/23 1657    Royanne Foots, DO 03/24/23 1048

## 2023-03-17 NOTE — Progress Notes (Signed)
[facility] night cross cover note:   I was notified by RN of the patient's request for a sleep aid. I subsequently placed order for prn melatonin for insomnia.     Keelan Tripodi, DO Hospitalist  

## 2023-03-18 ENCOUNTER — Other Ambulatory Visit: Payer: Self-pay | Admitting: Nurse Practitioner

## 2023-03-18 DIAGNOSIS — R531 Weakness: Secondary | ICD-10-CM | POA: Diagnosis not present

## 2023-03-18 DIAGNOSIS — E162 Hypoglycemia, unspecified: Secondary | ICD-10-CM

## 2023-03-18 DIAGNOSIS — G4733 Obstructive sleep apnea (adult) (pediatric): Secondary | ICD-10-CM

## 2023-03-18 DIAGNOSIS — E1140 Type 2 diabetes mellitus with diabetic neuropathy, unspecified: Secondary | ICD-10-CM

## 2023-03-18 DIAGNOSIS — E1142 Type 2 diabetes mellitus with diabetic polyneuropathy: Secondary | ICD-10-CM

## 2023-03-18 DIAGNOSIS — K219 Gastro-esophageal reflux disease without esophagitis: Secondary | ICD-10-CM

## 2023-03-18 HISTORY — DX: Hypoglycemia, unspecified: E16.2

## 2023-03-18 LAB — BASIC METABOLIC PANEL
Anion gap: 8 (ref 5–15)
BUN: 30 mg/dL — ABNORMAL HIGH (ref 8–23)
CO2: 22 mmol/L (ref 22–32)
Calcium: 9.2 mg/dL (ref 8.9–10.3)
Chloride: 106 mmol/L (ref 98–111)
Creatinine, Ser: 1.34 mg/dL — ABNORMAL HIGH (ref 0.61–1.24)
GFR, Estimated: 56 mL/min — ABNORMAL LOW (ref >60)
Glucose, Bld: 168 mg/dL — ABNORMAL HIGH (ref 70–99)
Potassium: 3.7 mmol/L (ref 3.5–5.1)
Sodium: 136 mmol/L (ref 135–145)

## 2023-03-18 LAB — CBC
HCT: 45.2 % (ref 39.0–52.0)
Hemoglobin: 14.9 g/dL (ref 13.0–17.0)
MCH: 30.5 pg (ref 26.0–34.0)
MCHC: 33 g/dL (ref 30.0–36.0)
MCV: 92.6 fL (ref 80.0–100.0)
Platelets: 134 10*3/uL — ABNORMAL LOW (ref 150–400)
RBC: 4.88 MIL/uL (ref 4.22–5.81)
RDW: 12.1 % (ref 11.5–15.5)
WBC: 9.2 10*3/uL (ref 4.0–10.5)
nRBC: 0 % (ref 0.0–0.2)

## 2023-03-18 LAB — MAGNESIUM: Magnesium: 1.9 mg/dL (ref 1.7–2.4)

## 2023-03-18 LAB — GLUCOSE, CAPILLARY
Glucose-Capillary: 78 mg/dL (ref 70–99)
Glucose-Capillary: 146 mg/dL — ABNORMAL HIGH (ref 70–99)
Glucose-Capillary: 172 mg/dL — ABNORMAL HIGH (ref 70–99)
Glucose-Capillary: 253 mg/dL — ABNORMAL HIGH (ref 70–99)
Glucose-Capillary: 128 mg/dL — ABNORMAL HIGH (ref 70–99)
Glucose-Capillary: 108 mg/dL — ABNORMAL HIGH (ref 70–99)

## 2023-03-18 LAB — PROCALCITONIN: Procalcitonin: <0.1 ng/mL

## 2023-03-18 MED ORDER — METOPROLOL TARTRATE 25 MG PO TABS
25.0000 mg | ORAL_TABLET | Freq: Two times a day (BID) | ORAL | Status: DC
  Administered 2023-03-18 – 2023-03-19 (×3): 25 mg via ORAL
  Filled 2023-03-18 (×3): qty 1

## 2023-03-18 MED ORDER — ROSUVASTATIN CALCIUM 20 MG PO TABS
20.0000 mg | ORAL_TABLET | Freq: Every day | ORAL | Status: DC
  Administered 2023-03-18 – 2023-03-19 (×2): 20 mg via ORAL
  Filled 2023-03-18 (×2): qty 1

## 2023-03-18 MED ORDER — PANTOPRAZOLE SODIUM 40 MG PO TBEC
40.0000 mg | DELAYED_RELEASE_TABLET | Freq: Every day | ORAL | Status: DC
  Administered 2023-03-18 – 2023-03-19 (×2): 40 mg via ORAL
  Filled 2023-03-18 (×2): qty 1

## 2023-03-18 MED ORDER — SODIUM CHLORIDE 0.9 % IV SOLN: INTRAVENOUS | Status: DC

## 2023-03-18 MED ORDER — AMLODIPINE BESYLATE 5 MG PO TABS
5.0000 mg | ORAL_TABLET | Freq: Every day | ORAL | Status: DC
Start: 2023-03-18 — End: 2023-03-18
  Administered 2023-03-18: 5 mg via ORAL
  Filled 2023-03-18: qty 1

## 2023-03-18 MED ORDER — NAPHAZOLINE-GLYCERIN 0.012-0.25 % OP SOLN
1.0000 [drp] | Freq: Four times a day (QID) | OPHTHALMIC | Status: DC | PRN
  Administered 2023-03-19: 2 [drp] via OPHTHALMIC
  Filled 2023-03-18: qty 15

## 2023-03-18 MED ORDER — ZOLPIDEM TARTRATE 5 MG PO TABS
5.0000 mg | ORAL_TABLET | Freq: Every evening | ORAL | Status: DC | PRN
  Administered 2023-03-18: 5 mg via ORAL
  Filled 2023-03-18: qty 1

## 2023-03-18 NOTE — Evaluation (Signed)
Occupational Therapy Evaluation Patient Details Name: Paul [name] MRN: [id number] DOB: 11/06/1950 Today's Date: 03/18/2023   History of Present Illness Pt is a 72 y/o M presenting to ED on 111/30 with generalized weakness and fatigue, found to be hypoglycemic upon admission. PMH includes CAD, CHF, COPD, HLD, DDD, HTN, GERD, cirrhosis, DM2, L hand surgery (skin lesion removal 11/19)   Clinical Impression   Pt reports ind at baseline with ADLs although has taken increased time, has assist for IADLs from roommate who he lives with. Pt uses quad cane for mobility. Pt currently needing set up - CGA for ADLs, supervision for bed mobility and CGA for transfers with RW. Pt using LUE functionally throughout session to assist with seated/standing bathing and grooming tasks. Pt presenting with impairments listed below, will follow acutely. Recommend HHOT at d/c.        If plan is discharge home, recommend the following: A little help with walking and/or transfers;A little help with bathing/dressing/bathroom;Assistance with cooking/housework;Supervision due to cognitive status;Assist for transportation;Help with stairs or ramp for entrance    Functional Status Assessment  Patient has had a recent decline in their functional status and demonstrates the ability to make significant improvements in function in a reasonable and predictable amount of time.  Equipment Recommendations  None recommended by OT (pt has all needed DME)    Recommendations for Other Services PT consult     Precautions / Restrictions Precautions Precautions: Fall Restrictions Weight Bearing Restrictions: No      Mobility Bed Mobility Overal bed mobility: Needs Assistance Bed Mobility: Supine to Sit     Supine to sit: Supervision          Transfers Overall transfer level: Needs assistance Equipment used: Rolling walker (2 wheels) Transfers: Sit to/from Stand Sit to Stand: Contact guard assist                   Balance Overall balance assessment: Needs assistance Sitting-balance support: Feet supported Sitting balance-Leahy Scale: Good     Standing balance support: During functional activity, Reliant on assistive device for balance Standing balance-Leahy Scale: Fair Standing balance comment: stands statically without AD                           ADL either performed or assessed with clinical judgement   ADL Overall ADL's : Needs assistance/impaired Eating/Feeding: Set up   Grooming: Set up;Sitting   Upper Body Bathing: Set up   Lower Body Bathing: Set up   Upper Body Dressing : Set up   Lower Body Dressing: Set up   Toilet Transfer: Contact guard assist;Ambulation;Rolling walker (2 wheels)   Toileting- Clothing Manipulation and Hygiene: Contact guard assist       Functional mobility during ADLs: Contact guard assist;Rolling walker (2 wheels)       Vision   Vision Assessment?: No apparent visual deficits     Perception Perception: Not tested       Praxis Praxis: Not tested       Pertinent Vitals/Pain Pain Assessment Pain Assessment: Faces Pain Score: 2  Faces Pain Scale: Hurts a little bit Pain Location: generalized Pain Descriptors / Indicators: Discomfort Pain Intervention(s): Monitored during session, Limited activity within patient's tolerance     Extremity/Trunk Assessment Upper Extremity Assessment Upper Extremity Assessment: Generalized weakness (L hand in ace bandage, pt states is unable to get bandage wet, but moves digits [facility] for BADL. Pt has chronic numbness in BUE from prior  career)   Lower Extremity Assessment Lower Extremity Assessment: Defer to PT evaluation   Cervical / Trunk Assessment Cervical / Trunk Assessment: Normal   Communication Communication Communication: No apparent difficulties   Cognition Arousal: Alert Behavior During Therapy: WFL for tasks assessed/performed Overall Cognitive Status: Within Functional  Limits for tasks assessed                                       General Comments  VSS on RA    Exercises     Shoulder Instructions      Home Living Family/patient expects to be discharged to:: Private residence Living Arrangements: Non-relatives/Friends Available Help at Discharge: Friend(s);Family Type of Home: House Home Access: Stairs to enter [employer] of Steps: 3 Entrance Stairs-Rails: Can reach both Home Layout: Multi-level Alternate Level Stairs-Number of Steps: 1 step to bedroom   Bathroom Shower/Tub: Chief Strategy Officer: Standard Bathroom Accessibility: Yes   Home Equipment: Rollator (4 wheels);Shower Counsellor (2 wheels);Cane - quad          Prior Functioning/Environment Prior Level of Function : Needs assist             Mobility Comments: cane ADLs Comments: struggles with bathing and LB ADLs, has roommate that assist with bathing        OT Problem List: Decreased strength;Decreased range of motion;Decreased activity tolerance;Impaired balance (sitting and/or standing);Decreased safety awareness;Impaired sensation      OT Treatment/Interventions: Self-care/ADL training;Therapeutic exercise;Energy conservation;DME and/or AE instruction;Therapeutic activities;Balance training;Patient/family education    OT Goals(Current goals can be found in the care plan section) Acute Rehab OT Goals Patient Stated Goal: none stated OT Goal Formulation: With patient Time For Goal Achievement: 04/01/23 Potential to Achieve Goals: Good ADL Goals Pt Will Perform Upper Body Dressing: Independently Pt Will Perform Lower Body Dressing: Independently Pt Will Transfer to Toilet: Independently;ambulating;regular height toilet Pt Will Perform Tub/Shower Transfer: Tub transfer;Shower transfer;Independently Additional ADL Goal #1: pt will tolerate standing functional task x10 min in order to improve activity tolerance for  ADLs  OT Frequency: Min 1X/week    Co-evaluation              AM-PAC OT "6 Clicks" Daily Activity     Outcome Measure Help from another person eating meals?: None Help from another person taking care of personal grooming?: A Little Help from another person toileting, which includes using toliet, bedpan, or urinal?: A Little Help from another person bathing (including washing, rinsing, drying)?: A Little Help from another person to put on and taking off regular upper body clothing?: A Little Help from another person to put on and taking off regular lower body clothing?: A Little 6 Click Score: 19   End of Session Equipment Utilized During Treatment: Rolling walker (2 wheels) Nurse Communication: Mobility status  Activity Tolerance: Patient tolerated treatment well Patient left: Other (comment) (handoff to PT in room)  OT Visit Diagnosis: Unsteadiness on feet (R26.81);Other abnormalities of gait and mobility (R26.89);Muscle weakness (generalized) (M62.81)                Time: 1324-4010 OT Time Calculation (min): 25 min Charges:  OT General Charges $OT Visit: 1 Visit OT Evaluation $OT Eval Moderate Complexity: 1 Mod OT Treatments $Self Care/Home Management : 8-22 mins  Carver Fila, OTD, OTR/L SecureChat Preferred Acute Rehab [phone]   Paul [name] 03/18/2023, 10:02 AM

## 2023-03-18 NOTE — Care Management Obs Status (Signed)
MEDICARE OBSERVATION STATUS NOTIFICATION   Patient Details  Name: Paul [name] MRN: [id number] Date of Birth: [birth year]   Medicare Observation Status Notification Given:  Yes    Lawerance Sabal, RN 03/18/2023, 2:56 PM

## 2023-03-18 NOTE — Evaluation (Signed)
Physical Therapy Evaluation Patient Details Name: Paul [name] MRN: [id number] DOB: [birth year] Today's Date: 03/18/2023  History of Present Illness  Pt is a 72 y/o M presenting to ED on 111/30 with generalized weakness and fatigue, found to be hypoglycemic upon admission. PMH includes CAD, CHF, COPD, HLD, DDD, HTN, GERD, cirrhosis, DM2, L hand surgery (skin lesion removal 11/19)  Clinical Impression   Pt admitted with above diagnosis. Lives at home with roommate, in a single-level home with a few steps to enter; Prior to admission, pt was able to walk fucntional household distances with a quad cane; Presents to PT with mild gait unsteadiness, benefiting from bil support the RW provides; Tells me he has a RW at home (in addition to the cane he typically uses);  Min assist to [facility] for transfers and hallway amb with RW; Rec HHPT follow up; Pt currently with functional limitations due to the deficits listed below (see PT Problem List). Pt will benefit from skilled PT to increase their independence and safety with mobility to allow discharge to the venue listed below.           If plan is discharge home, recommend the following: A little help with walking and/or transfers;Assistance with cooking/housework   Can travel by private vehicle        Equipment Recommendations None recommended by PT (Pretty well-equipped)  Recommendations for Other Services       Functional Status Assessment Patient has had a recent decline in their functional status and demonstrates the ability to make significant improvements in function in a reasonable and predictable amount of time.     Precautions / Restrictions Precautions Precautions: Fall Restrictions Weight Bearing Restrictions: No      Mobility  Bed Mobility Overal bed mobility: Needs Assistance Bed Mobility: Supine to Sit     Supine to sit: Supervision          Transfers Overall transfer level: Needs assistance Equipment used: Rolling  walker (2 wheels) Transfers: Sit to/from Stand Sit to Stand: Contact guard assist           General transfer comment: Stood from chair wihtout armrests, pushing up from the sink vanity    Ambulation/Gait Ambulation/Gait assistance: Contact guard assist Gait Distance (Feet): 110 Feet Assistive device: Rolling walker (2 wheels) Gait Pattern/deviations: Step-through pattern, Decreased step length - right, Decreased step length - left Gait velocity: slwoed     General Gait Details: Cues to self-monitor for activity tolerance  Stairs            Wheelchair Mobility     Tilt Bed    Modified Rankin (Stroke Patients Only)       Balance Overall balance assessment: Needs assistance Sitting-balance support: Feet supported Sitting balance-Leahy Scale: Good     Standing balance support: During functional activity, Reliant on assistive device for balance Standing balance-Leahy Scale: Fair Standing balance comment: stands statically without AD                             Pertinent Vitals/Pain Pain Assessment Pain Assessment: Faces Faces Pain Scale: Hurts a little bit Pain Location: generalized Pain Descriptors / Indicators: Discomfort Pain Intervention(s): Monitored during session    Home Living Family/patient expects to be discharged to:: Private residence Living Arrangements: Non-relatives/Friends Available Help at Discharge: Friend(s);Family Type of Home: House Home Access: Stairs to enter Entrance Stairs-Rails: Can reach both Entrance Stairs-Number of Steps: 3 Alternate Level Stairs-Number of Steps: 1  step to bedroom Home Layout: Multi-level Home Equipment: Rollator (4 wheels);Shower Counsellor (2 wheels);Cane - quad      Prior Function Prior Level of Function : Needs assist             Mobility Comments: cane ADLs Comments: struggles with bathing and LB ADLs, has roommate that assist with bathing     Extremity/Trunk  Assessment   Upper Extremity Assessment Upper Extremity Assessment: Defer to OT evaluation    Lower Extremity Assessment Lower Extremity Assessment: Generalized weakness    Cervical / Trunk Assessment Cervical / Trunk Assessment: Normal  Communication   Communication Communication: No apparent difficulties  Cognition Arousal: Alert Behavior During Therapy: WFL for tasks assessed/performed Overall Cognitive Status: Within Functional Limits for tasks assessed                                          General Comments General comments (skin integrity, edema, etc.): NAD on RA    Exercises     Assessment/Plan    PT Assessment Patient needs continued PT services  PT Problem List Decreased strength;Decreased activity tolerance;Decreased balance;Decreased mobility       PT Treatment Interventions DME instruction;Gait training;Stair training;Functional mobility training;Therapeutic activities;Balance training;Therapeutic exercise;Neuromuscular re-education;Patient/family education;Cognitive remediation    PT Goals (Current goals can be found in the Care Plan section)  Acute Rehab PT Goals Patient Stated Goal: Hopes to get home soon PT Goal Formulation: With patient Time For Goal Achievement: 04/01/23 Potential to Achieve Goals: Good    Frequency Min 1X/week     Co-evaluation               AM-PAC PT "6 Clicks" Mobility  Outcome Measure Help needed turning from your back to your side while in a flat bed without using bedrails?: None Help needed moving from lying on your back to sitting on the side of a flat bed without using bedrails?: A Little Help needed moving to and from a bed to a chair (including a wheelchair)?: A Little Help needed standing up from a chair using your arms (e.g., wheelchair or bedside chair)?: A Little Help needed to walk in hospital room?: A Little Help needed climbing 3-5 steps with a railing? : A Little 6 Click Score: 19     End of Session Equipment Utilized During Treatment: Gait belt Activity Tolerance: Patient tolerated treatment well Patient left: in chair;with call bell/phone within reach;with chair alarm set Nurse Communication: Mobility status PT Visit Diagnosis: Unsteadiness on feet (R26.81)    Time: 1610-9604 PT Time Calculation (min) (ACUTE ONLY): 12 min   Charges:   PT Evaluation $PT Eval Low Complexity: 1 Low   PT General Charges $$ ACUTE PT VISIT: 1 Visit         Van Clines, PT  Acute Rehabilitation Services Office [phone] Secure Chat welcomed   Levi Aland 03/18/2023, 12:37 PM

## 2023-03-18 NOTE — Plan of Care (Signed)
  Problem: Health Behavior/Discharge Planning: Goal: Ability to manage health-related needs will improve Outcome: Progressing   Problem: Clinical Measurements: Goal: Ability to maintain clinical measurements within normal limits will improve Outcome: Progressing   Problem: Education: Goal: Knowledge of General Education information will improve Description: Including pain rating scale, medication(s)/side effects and non-pharmacologic comfort measures Outcome: Progressing   

## 2023-03-18 NOTE — Progress Notes (Signed)
PROGRESS NOTE  Paul [name]  DOB: 08/29/1950  PCP: Tollie Eth, NP QMV:[id number]  DOA: 03/17/2023  LOS: 0 days  Hospital Day: 2  Brief narrative: Paul [name] is a 72 y.o. male with PMH significant for DM2, HTN, HLD, CAD/stent/CABG, CHF, CKD, COPD, liver cirrhosis, depression, chronic pain syndrome, GERD, iron deficiency anemia, spinal stenosis. 11/30, patient presented to the ED for evaluation of generalized weakness and fatigue.  Patient reports that since he had his left hand surgery on 11/19, he has not felt very well.  He has some baseline fatigue and dyspnea on exertion but over the preceding 2 days, he has not been eating due to nausea and abdominal bloating.  Despite having poor appetite, patient was using 40 units of his long-acting insulin as well as 14 units of his short acting.   EMS found patient to be hypoglycemic with CBG of 65 and he was given 15 g of oral glucose with improvement to 118.  In the ED, hemodynamically stable. Respiratory panel negative for COVID, flu CXR with no acute cardiopulmonary disease CT chest shows bilateral atelectasis and some areas of scarring and fibrotic changes but no consolidation or effusion. Patient received IV Rocephin and azithromycin for presumed pneumonia. Patient had hypoglycemic episodes in the ER with CBGs in the 50s requiring 1 amp of D50. TRH was consulted for admission   Subjective: Patient was seen and examined this morning.  Pleasant elderly Caucasian male.  Sitting up in recliner.  Not in distress.  Says he has not been able to sleep for last 2 nights and asks Ambien for tonight. Participated with PT today.  Home with PT was recommended. No family at bedside  Assessment and plan: Generalized weakness Generalized deconditioning No evidence of infection.   Hypoglycemia could be causing the symptoms.   PT/OT eval obtained.  Home with PT recommended   Hypoglycemia  type 2 diabetes mellitus A1c 10.1 on 01/11/2023 PTA  meds-Lantus 40 units daily and Humalog 14 units 3 times daily with meals, metformin 1000 mg twice daily Despite poor oral intake, patient was continuing with same insulin regimen at home and probably getting hypoglycemic with that. Currently on SSI with Accu-Cheks Recent Labs  Lab 03/17/23 2107 03/17/23 2356 03/18/23 0316 03/18/23 0911 03/18/23 1203  GLUCAP 143* 78 78 146* 172*   AKI Creatinine normal at baseline.  Presented with creatinine elevated to 1.37.  Expect improvement with IV fluid Start gentle hydration Continue to monitor labs Recent Labs    05/04/22 1114 05/30/22 1106 06/08/22 1356 08/10/22 1525 09/06/22 1302 01/11/23 1242 02/27/23 1401 03/17/23 1320 03/18/23 0529  BUN 8 14 15 16 14 18 20  35* 30*  CREATININE 0.95 0.95 0.91 1.01 1.02 1.03 0.91 1.37* 1.34*   Leukocytosis No symptoms or signs of infection.  WC count elevated to 13.1, likely reactive.  Evidence of pneumonia.  Urinalysis normal Procalcitonin level not elevated Currently not on antibiotics Continue to monitor Recent Labs  Lab 03/17/23 1239 03/18/23 0529  WBC 13.1* 9.2  PROCALCITON  --  <0.10   COPD No respiratory distress or wheezing on exam. Continue Anoro Ellipta daily   Chronic diastolic CHF Hypertension Last TTE in 04/2022 showed EF 65-70%, and G1DD.  Patient with distant crackles at the bases but looks euvolemic on exam. PTA meds- Metoprolol 25 mg twice daily Continue metoprolol   CAD/stent/CABG HLD PTA meds-aspirin 81 mg daily, rosuvastatin 20 mg daily Continue both  Liver cirrhosis Does not seem to be on diuretics Volume status stable  GERD Protonix 40 mg daily Hemoglobin stable  Insomnia Ambien at bedtime  Mobility: PT eval obtained.  Home with PT recommended  Goals of care   Code Status: Full Code     DVT prophylaxis:  enoxaparin (LOVENOX) injection 40 mg Start: 03/17/23 1800   Antimicrobials: Not on antibiotics currently Fluid: NS at 75 mL/h Consultants:  None Family Communication: None at bedside  Status: Observation Level of care:  Med-Surg   Patient is from: Home Needs to continue in-hospital care: Needs IV fluid, renal function monitoring Anticipated d/c to: Hopefully home tomorrow   Diet:  Diet Order             Diet regular Room service appropriate? Yes; Fluid consistency: Thin  Diet effective now                   Scheduled Meds:  aspirin EC  81 mg Oral Daily   enoxaparin (LOVENOX) injection  40 mg Subcutaneous Q24H   feeding supplement  237 mL Oral BID BM   gabapentin  600 mg Oral TID   insulin aspart  0-15 Units Subcutaneous Q4H   [START ON 03/19/2023] metFORMIN  1,000 mg Oral BID WC   metoprolol tartrate  25 mg Oral BID   pantoprazole  40 mg Oral Daily   rosuvastatin  20 mg Oral Daily   sodium chloride flush  3 mL Intravenous Q12H   umeclidinium-vilanterol  1 puff Inhalation Daily    PRN meds: acetaminophen **OR** acetaminophen, melatonin, naphazoline-glycerin, ondansetron **OR** ondansetron (ZOFRAN) IV, senna-docusate, sodium chloride flush, zolpidem   Infusions:   sodium chloride      Antimicrobials: Anti-infectives (From admission, onward)    Start     Dose/Rate Route Frequency Ordered Stop   03/17/23 1615  cefTRIAXone (ROCEPHIN) 1 g in sodium chloride 0.9 % 100 mL IVPB        1 g 200 mL/hr over 30 Minutes Intravenous  Once 03/17/23 1612 03/17/23 1652   03/17/23 1615  azithromycin (ZITHROMAX) 500 mg in sodium chloride 0.9 % 250 mL IVPB        500 mg 250 mL/hr over 60 Minutes Intravenous  Once 03/17/23 1612 03/17/23 1729       Objective: Vitals:   03/18/23 0807 03/18/23 0922  BP:  (!) 149/74  Pulse:  81  Resp:  18  Temp:  97.9 F (36.6 C)  SpO2: 95% 92%    Intake/Output Summary (Last 24 hours) at 03/18/2023 1607 Last data filed at 03/18/2023 0300 Gross per 24 hour  Intake 600 ml  Output 550 ml  Net 50 ml   Filed Weights   03/17/23 1148  Weight: 81.6 kg   Weight change:  Body  mass index is 29.95 kg/m.   Physical Exam: General exam: Pleasant, elderly Caucasian male.  Not in distress Skin: No rashes, lesions or ulcers. HEENT: Atraumatic, normocephalic, no obvious bleeding Lungs: Clear to auscultation bilaterally CVS: Regular rate and rhythm, no murmur GI/Abd soft, nontender, nondistended, bowel sound present CNS: Alert, awake, oriented x 3 Psychiatry: Mood appropriate Extremities: No pedal edema, no calf tenderness  Data Review: I have personally reviewed the laboratory data and studies available.  F/u labs ordered Unresulted Labs (From admission, onward)     Start     Ordered   03/19/23 0500  Basic metabolic panel  Daily,   R      03/18/23 1601   03/19/23 0500  CBC with Differential/Platelet  Daily,   R  03/18/23 1601   03/19/23 0500  Ammonia  Tomorrow morning,   R        03/18/23 1601            Total time spent in review of labs and imaging, patient evaluation, formulation of plan, documentation and communication with family: 55 minutes  Signed, Lorin Glass, MD [facility] 03/18/2023

## 2023-03-18 NOTE — Plan of Care (Signed)
  Problem: Education: Goal: Knowledge of General Education information will improve Description: Including pain rating scale, medication(s)/side effects and non-pharmacologic comfort measures Outcome: Progressing   Problem: Health Behavior/Discharge Planning: Goal: Ability to manage health-related needs will improve Outcome: Progressing   Problem: Clinical Measurements: Goal: Ability to maintain clinical measurements within normal limits will improve Outcome: Progressing Goal: Respiratory complications will improve Outcome: Progressing Goal: Cardiovascular complication will be avoided Outcome: Progressing   Problem: Elimination: Goal: Will not experience complications related to bowel motility Outcome: Progressing Goal: Will not experience complications related to urinary retention Outcome: Progressing

## 2023-03-19 ENCOUNTER — Other Ambulatory Visit (HOSPITAL_COMMUNITY): Payer: Self-pay

## 2023-03-19 DIAGNOSIS — E440 Moderate protein-calorie malnutrition: Secondary | ICD-10-CM | POA: Insufficient documentation

## 2023-03-19 DIAGNOSIS — R531 Weakness: Secondary | ICD-10-CM | POA: Diagnosis not present

## 2023-03-19 HISTORY — DX: Moderate protein-calorie malnutrition: E44.0

## 2023-03-19 LAB — GLUCOSE, CAPILLARY
Glucose-Capillary: 113 mg/dL — ABNORMAL HIGH (ref 70–99)
Glucose-Capillary: 121 mg/dL — ABNORMAL HIGH (ref 70–99)
Glucose-Capillary: 211 mg/dL — ABNORMAL HIGH (ref 70–99)

## 2023-03-19 LAB — CBC WITH DIFFERENTIAL/PLATELET
Abs Immature Granulocytes: 0.03 10*3/uL (ref 0.00–0.07)
Basophils Absolute: 0.1 10*3/uL (ref 0.0–0.1)
Basophils Relative: 1 %
Eosinophils Absolute: 0.3 10*3/uL (ref 0.0–0.5)
Eosinophils Relative: 4 %
HCT: 42.1 % (ref 39.0–52.0)
Hemoglobin: 14 g/dL (ref 13.0–17.0)
Immature Granulocytes: 0 %
Lymphocytes Relative: 36 %
Lymphs Abs: 2.4 10*3/uL (ref 0.7–4.0)
MCH: 31 pg (ref 26.0–34.0)
MCHC: 33.3 g/dL (ref 30.0–36.0)
MCV: 93.3 fL (ref 80.0–100.0)
Monocytes Absolute: 0.5 10*3/uL (ref 0.1–1.0)
Monocytes Relative: 7 %
Neutro Abs: 3.6 10*3/uL (ref 1.7–7.7)
Neutrophils Relative %: 52 %
Platelets: 121 10*3/uL — ABNORMAL LOW (ref 150–400)
RBC: 4.51 MIL/uL (ref 4.22–5.81)
RDW: 12.1 % (ref 11.5–15.5)
WBC: 6.9 10*3/uL (ref 4.0–10.5)
nRBC: 0 % (ref 0.0–0.2)

## 2023-03-19 LAB — BASIC METABOLIC PANEL
Anion gap: 8 (ref 5–15)
BUN: 22 mg/dL (ref 8–23)
CO2: 23 mmol/L (ref 22–32)
Calcium: 9 mg/dL (ref 8.9–10.3)
Chloride: 109 mmol/L (ref 98–111)
Creatinine, Ser: 1.15 mg/dL (ref 0.61–1.24)
GFR, Estimated: >60 mL/min (ref >60)
Glucose, Bld: 120 mg/dL — ABNORMAL HIGH (ref 70–99)
Potassium: 4 mmol/L (ref 3.5–5.1)
Sodium: 140 mmol/L (ref 135–145)

## 2023-03-19 LAB — AMMONIA: Ammonia: 15 umol/L (ref 9–35)

## 2023-03-19 MED ORDER — NOVOLOG FLEXPEN RELION 100 UNIT/ML ~~LOC~~ SOPN
PEN_INJECTOR | SUBCUTANEOUS | 11 refills | Status: DC
  Filled 2023-03-19: qty 3, 15d supply, fill #0

## 2023-03-19 MED ORDER — TOUJEO SOLOSTAR 300 UNIT/ML ~~LOC~~ SOPN: 10.0000 [IU] | PEN_INJECTOR | Freq: Every day | SUBCUTANEOUS | Status: DC

## 2023-03-19 MED ORDER — NOVOLOG FLEXPEN RELION 100 UNIT/ML ~~LOC~~ SOPN
PEN_INJECTOR | SUBCUTANEOUS | 11 refills | Status: DC
  Filled 2023-03-19: qty 3, 11d supply, fill #0
  Filled 2023-03-19: qty 15, fill #0

## 2023-03-19 MED ORDER — ALBUTEROL SULFATE HFA 108 (90 BASE) MCG/ACT IN AERS
2.0000 | INHALATION_SPRAY | RESPIRATORY_TRACT | 3 refills | Status: DC | PRN
  Filled 2023-03-19: qty 6.7, 17d supply, fill #0

## 2023-03-19 MED ORDER — ADULT MULTIVITAMIN W/MINERALS CH
1.0000 | ORAL_TABLET | Freq: Every day | ORAL | Status: DC
  Administered 2023-03-19: 1 via ORAL
  Filled 2023-03-19: qty 1

## 2023-03-19 NOTE — Progress Notes (Signed)
Mobility Specialist Progress Note:    03/19/23 1450  Mobility  Activity Ambulated with assistance in room  Level of Assistance Contact guard assist, steadying assist  Assistive Device None  Distance Ambulated (ft) 12 ft  Activity Response Tolerated well  Mobility Referral Yes  $Mobility charge 1 Mobility  Mobility Specialist Start Time (ACUTE ONLY) 1449  Mobility Specialist Stop Time (ACUTE ONLY) 1450  Mobility Specialist Time Calculation (min) (ACUTE ONLY) 1 min   Responded to two chair alarms, pt requested assistance to bathroom for first. Ambulated with ModI using sink and handrails, declined RW despite unsteadiness. Tolerated well, had a couple occurences with LOB, able to self correct. Void successful, transferred pt to EOB with CGA to dress for d/c. Left pt in room, sitting EOB with TOC RN.   Responded to second chair alarm, pt ambulating independently, no AD in room. Assisted pt back to chair, left with all needs met, call bell in reach, eager for d/c.   Feliciana Rossetti Mobility Specialist Please contact via SecureChat or  Rehab office at [phone]

## 2023-03-19 NOTE — Plan of Care (Signed)

## 2023-03-19 NOTE — Progress Notes (Signed)
Initial Nutrition Assessment  DOCUMENTATION CODES:   Non-severe (moderate) malnutrition in context of acute illness/injury  INTERVENTION:  Continue with regular diet Continue Ensure Plus High Protein po BID, each supplement provides 350 kcal and 20 grams of protein. Multivitamin/minerals  NUTRITION DIAGNOSIS:   Moderate Malnutrition related to acute illness as evidenced by mild fat depletion, mild muscle depletion, energy intake < or equal to 50% for > or equal to 5 days.    GOAL:   Patient will meet greater than or equal to 90% of their needs    MONITOR:   Supplement acceptance, PO intake, Labs  REASON FOR ASSESSMENT:   Consult Assessment of nutrition requirement/status  ASSESSMENT:   Admitted from home with generalized weakness. PMH; CAD, CHF, COPD, HLD, DDD, HTN, cirrhosis, T2DM, with recent left hand surgery on 11/19. Patient alert and orientated at time of visit.  Stated that he has not noted any weight changes, Endorses declined appetite and meal intake for ~ 1 week. Stated he does have problems chewing at times if meat is to tough, offered soft diet he said he would rather stay with current diet.  He reports drinking all of his ensure.  Has no known food allergies. Lives at home with room mate.  Able to ambulate on own.  Reported he ate about 75% of his breakfast.  Admit weight: 81.6 kg Current weight: 81.6 kg   Weight history: 03/17/23 81.6 kg  03/06/23 84.3 kg  02/16/23 84.5 kg  01/24/23 82.6 kg  01/18/23 82.7 kg  01/11/23 82.7 kg  01/08/23 82 kg  12/25/22 80.7 kg  12/13/22 82.6 kg  11/10/22 85.2 kg      Average Meal Intake:  60% intake x 1 recorded meals  Nutritionally Relevant Medications: Scheduled Meds:  feeding supplement  237 mL Oral BID BM   gabapentin  600 mg Oral TID    Labs Reviewed:  CBG ranges from 120-168 mg/dL over the last 24 hours HgbA1c 7.1 (01/11/23)    NUTRITION - FOCUSED PHYSICAL EXAM:  Flowsheet Row Most Recent Value   Orbital Region Mild depletion  Upper Arm Region Moderate depletion  Thoracic and Lumbar Region No depletion  Buccal Region Mild depletion  Temple Region Mild depletion  Clavicle Bone Region No depletion  Clavicle and Acromion Bone Region No depletion  Scapular Bone Region No depletion  Dorsal Hand Mild depletion  Patellar Region No depletion  Anterior Thigh Region No depletion  Posterior Calf Region Mild depletion  Edema (RD Assessment) None  Hair Reviewed  Eyes Reviewed  Mouth Reviewed  [Missing most of his teeth]  Skin Reviewed  Nails Reviewed       Diet Order:   Diet Order             Diet regular Room service appropriate? Yes; Fluid consistency: Thin  Diet effective now                   EDUCATION NEEDS:   Education needs have been addressed  Skin:  Skin Assessment: Skin Integrity Issues: Skin Integrity Issues:: Incisions Incisions: Left hand  Last BM:  PTA  Height:   Ht Readings from Last 1 Encounters:  03/17/23 5\' 5"  (1.651 m)    Weight:   Wt Readings from Last 1 Encounters:  03/17/23 81.6 kg    Ideal Body Weight:     BMI:  Body mass index is 29.95 kg/m.  Estimated Nutritional Needs:   Kcal:  2100-2450 kcal/d  Protein:  105-125 g/d  Fluid:  72ml/kcal    Jamelle Haring RDN, LDN Clinical Dietitian  RDN pager # available on Amion

## 2023-03-19 NOTE — Progress Notes (Signed)
Physical Therapy Treatment Patient Details Name: Paul [name] MRN: [id number] DOB: [birth year] Today's Date: 03/19/2023   History of Present Illness Pt is a 72 y/o M presenting to ED on 111/30 with generalized weakness and fatigue, found to be hypoglycemic upon admission. PMH includes CAD, CHF, COPD, HLD, DDD, HTN, GERD, cirrhosis, DM2, L hand surgery (skin lesion removal 11/19)    PT Comments  Progressing well, able to ambulate 195 feet this morning with RW for support. No overt LOB, mild drifting with device but able to self correct. Educated on safety and awareness. Stable with RW for support during transfers. Reviewed LE exercises. Declines need to practice navigating steps in order to enter/exit his home; states he feels confident that this will not be an issue as long as he uses his rail. Patient will continue to benefit from skilled physical therapy services to further improve independence with functional mobility.     If plan is discharge home, recommend the following: A little help with walking and/or transfers;Assistance with cooking/housework   Can travel by private vehicle        Equipment Recommendations  None recommended by PT (Pretty well-equipped)    Recommendations for Other Services       Precautions / Restrictions Precautions Precautions: Fall Restrictions Weight Bearing Restrictions: No     Mobility  Bed Mobility Overal bed mobility: Modified Independent Bed Mobility: Supine to Sit     Supine to sit: Modified independent (Device/Increase time)     General bed mobility comments: Mod I no assist needed, a little slow.    Transfers Overall transfer level: Needs assistance Equipment used: Rolling walker (2 wheels) Transfers: Sit to/from Stand Sit to Stand: Supervision           General transfer comment: Supervision for safety, no physical assist. RW to steady himself upon rising.    Ambulation/Gait Ambulation/Gait assistance: Supervision Gait  Distance (Feet): 195 Feet Assistive device: Rolling walker (2 wheels) Gait Pattern/deviations: Step-through pattern, Decreased step length - right, Decreased step length - left, Drifts right/left Gait velocity: dec Gait velocity interpretation: <1.8 ft/sec, indicate of risk for recurrent falls   General Gait Details: Stable with RW, fair pace, minor drifting but able to self correct. Cues for awareness. No buckling or overt LOB during bout. Sto;; fee;s a bot wealer tjam base;ome/   Stairs Stairs:  (Declines to practice. States he plans to use rail and this will not be an issue.)           Wheelchair Mobility     Tilt Bed    Modified Rankin (Stroke Patients Only)       Balance Overall balance assessment: Needs assistance Sitting-balance support: Feet supported Sitting balance-Leahy Scale: Good     Standing balance support: During functional activity, No upper extremity supported Standing balance-Leahy Scale: Fair Standing balance comment: stands statically without AD                            Cognition Arousal: Alert Behavior During Therapy: WFL for tasks assessed/performed Overall Cognitive Status: Within Functional Limits for tasks assessed                                          Exercises General Exercises - Lower Extremity Ankle Circles/Pumps: AROM, Both, 10 reps, Supine Quad Sets: Strengthening, Both, 10 reps, Seated Gluteal Sets: Strengthening,  5 reps, Both, Seated    General Comments        Pertinent Vitals/Pain Pain Assessment Pain Assessment: No/denies pain Pain Intervention(s): Monitored during session    Home Living                          Prior Function            PT Goals (current goals can now be found in the care plan section) Acute Rehab PT Goals Patient Stated Goal: Hopes to get home soon PT Goal Formulation: With patient Time For Goal Achievement: 04/01/23 Potential to Achieve Goals:  Good Progress towards PT goals: Progressing toward goals    Frequency    Min 1X/week      PT Plan      Co-evaluation              AM-PAC PT "6 Clicks" Mobility   Outcome Measure  Help needed turning from your back to your side while in a flat bed without using bedrails?: None Help needed moving from lying on your back to sitting on the side of a flat bed without using bedrails?: None Help needed moving to and from a bed to a chair (including a wheelchair)?: A Little Help needed standing up from a chair using your arms (e.g., wheelchair or bedside chair)?: A Little Help needed to walk in hospital room?: A Little Help needed climbing 3-5 steps with a railing? : A Little 6 Click Score: 20    End of Session Equipment Utilized During Treatment: Gait belt Activity Tolerance: Patient tolerated treatment well Patient left: in chair;with call bell/phone within reach;with chair alarm set   PT Visit Diagnosis: Unsteadiness on feet (R26.81)     Time: 9147-8295 PT Time Calculation (min) (ACUTE ONLY): 13 min  Charges:    $Gait Training: 8-22 mins PT General Charges $$ ACUTE PT VISIT: 1 Visit                     Kathlyn Sacramento, PT, DPT [facility]  Rehabilitation Services Physical Therapist Office: [phone] Website: [email or web address]    Berton Mount 03/19/2023, 11:16 AM

## 2023-03-19 NOTE — Discharge Summary (Addendum)
Physician Discharge Summary  Paul [name] YNW:[id number] DOB: 1950/05/16 DOA: 03/17/2023  PCP: Tollie Eth, NP  Admit date: 03/17/2023 Discharge date: 03/19/2023  Admitted From: Home Discharge disposition: Home with home health  Recommendations at discharge:  Based on your insulin requirement in the hospital, I will discharge him on Lantus 10 units daily and sliding scale Premeal insulin.  Brief narrative: Paul [name] is a 72 y.o. male with PMH significant for DM2, HTN, HLD, CAD/stent/CABG, CHF, CKD, COPD, liver cirrhosis, depression, chronic pain syndrome, GERD, iron deficiency anemia, spinal stenosis. 11/30, patient presented to the ED for evaluation of generalized weakness and fatigue.  Patient reports that since he had his left hand surgery on 11/19, he has not felt very well.  He has some baseline fatigue and dyspnea on exertion but over the preceding 2 days, he has not been eating due to nausea and abdominal bloating.  Despite having poor appetite, patient was using 40 units of his long-acting insulin as well as 14 units of his short acting.   EMS found patient to be hypoglycemic with CBG of 65 and he was given 15 g of oral glucose with improvement to 118.  In the ED, hemodynamically stable. Respiratory panel negative for COVID, flu CXR with no acute cardiopulmonary disease CT chest shows bilateral atelectasis and some areas of scarring and fibrotic changes but no consolidation or effusion. Patient received IV Rocephin and azithromycin for presumed pneumonia. Patient had hypoglycemic episodes in the ER with CBGs in the 50s requiring 1 amp of D50. TRH was consulted for admission   Subjective: Patient was seen and examined this morning.   Sitting up in recliner.  Not in distress.  Alert, awake, oriented x 3.  Able to have a normal conversation.  We discussed about his insulin usage.  Patient is aware of the types of insulin and the dose that he was using.  He understands the  insulin dose has been reduced  Hospital course: Generalized weakness Generalized deconditioning No evidence of infection.   Hypoglycemia could be causing the symptoms.   PT/OT eval obtained.  Home with PT recommended   Hypoglycemia  type 2 diabetes mellitus A1c 10.1 on 01/11/2023 PTA meds-Lantus 40 units daily and Humalog 14 units 3 times daily with meals, metformin 1000 mg twice daily Despite poor oral intake, patient was continuing with same insulin regimen at home and probably getting hypoglycemic with that.  A1c was not repeated this time, I suspect it would be lower than in September. While in the hospital for the last 2 days he has required sliding scale insulin only.  Blood sugar trending up. Based on his insulin requirement in the hospital, I will discharge him on Lantus 10 units daily and sliding scale Premeal insulin. Recent Labs  Lab 03/18/23 2014 03/18/23 2318 03/19/23 0625 03/19/23 0825 03/19/23 1247  GLUCAP 128* 108* 113* 121* 211*   AKI Creatinine normal at baseline.  Presented with creatinine elevated to 1.37.  Expect improvement with IV fluid Recent Labs    05/04/22 1114 05/30/22 1106 06/08/22 1356 08/10/22 1525 09/06/22 1302 01/11/23 1242 02/27/23 1401 03/17/23 1320 03/18/23 0529 03/19/23 0805  BUN 8 14 15 16 14 18 20  35* 30* 22  CREATININE 0.95 0.95 0.91 1.01 1.02 1.03 0.91 1.37* 1.34* 1.15   Leukocytosis No symptoms or signs of infection.  WC count elevated to 13.1, likely reactive.  Evidence of pneumonia.  Urinalysis normal Procalcitonin level not elevated Currently not on antibiotics Continue to monitor Recent Labs  Lab 03/17/23 1239 03/18/23 0529 03/19/23 0805  WBC 13.1* 9.2 6.9  PROCALCITON  --  <0.10  --    COPD No respiratory distress or wheezing on exam. Continue Anoro Ellipta daily   Chronic diastolic CHF Hypertension Last TTE in 04/2022 showed EF 65-70%, and G1DD.  Patient with distant crackles at the bases but looks euvolemic  on exam. PTA meds- Metoprolol 25 mg twice daily Continue metoprolol   CAD/stent/CABG HLD PTA meds-aspirin 81 mg daily, rosuvastatin 20 mg daily Continue both  Liver cirrhosis Does not seem to be on diuretics Volume status stable  GERD Protonix 40 mg daily Hemoglobin stable  Insomnia Ambien at bedtime  Impaired mobility PT eval obtained.  Home health PT recommended  Goals of care   Code Status: Full Code   Wounds:  - Incision (Closed) 03/17/23 Hand Left;Posterior (Active)  Date First Assessed/Time First Assessed: 03/17/23 2030   Location: Hand  Location Orientation: Left;Posterior  Present on Admission: (c) Yes    Assessments 03/17/2023  8:30 PM 03/19/2023  8:46 AM  Dressing Clean;Dry Clean, Dry, Intact  Site / Wound Assessment -- Dressing in place / Unable to assess  Closure Sutures --     No associated orders.    Discharge Exam:   Vitals:   03/18/23 1900 03/18/23 2319 03/19/23 0733 03/19/23 0824  BP: (!) 161/77 (!) 157/75  (!) 143/110  Pulse: 74 70  71  Resp: 18 18    Temp: 98.4 F (36.9 C) 98.3 F (36.8 C)  98.5 F (36.9 C)  TempSrc: Oral Axillary  Oral  SpO2: 93% 94% 91% 95%  Weight:      Height:        Body mass index is 29.95 kg/m.  General exam: Pleasant, elderly Caucasian male.  Not in distress Skin: No rashes, lesions or ulcers. HEENT: Atraumatic, normocephalic, no obvious bleeding Lungs: Clear to auscultation bilaterally CVS: Regular rate and rhythm, no murmur GI/Abd soft, nontender, nondistended, bowel sound present CNS: Alert, awake, oriented x 3 Psychiatry: Mood appropriate Extremities: No pedal edema, no calf tenderness  Follow ups:    Follow-up Information     Early, Sung Amabile, NP Follow up.   Specialty: Nurse Practitioner Contact information: [address] [place] [address] [phone]                 Discharge Instructions:   Discharge Instructions     Call MD for:  difficulty breathing, headache or  visual disturbances   Complete by: As directed    Call MD for:  extreme fatigue   Complete by: As directed    Call MD for:  hives   Complete by: As directed    Call MD for:  persistant dizziness or light-headedness   Complete by: As directed    Call MD for:  persistant nausea and vomiting   Complete by: As directed    Call MD for:  severe uncontrolled pain   Complete by: As directed    Call MD for:  temperature >100.4   Complete by: As directed    Diet Carb Modified   Complete by: As directed    Discharge instructions   Complete by: As directed    Recommendations at discharge:   Based on your insulin requirement in the hospital, I will discharge him on Lantus 10 units daily and sliding scale Premeal insulin.  Discharge instructions for diabetes mellitus: Check blood sugar 3 times a day and bedtime at home. If blood sugar running above 200  or less than 70 please call your MD to adjust insulin. If you notice signs and symptoms of hypoglycemia (low blood sugar) like jitteriness, confusion, thirst, tremor and sweating, please check blood sugar, drink sugary drink/biscuits/sweets to increase sugar level and call MD or return to ER.    General discharge instructions: Follow with Primary MD Early, Sung Amabile, NP in 7 days  Please request your PCP  to go over your hospital tests, procedures, radiology results at the follow up. Please get your medicines reviewed and adjusted.  Your PCP may decide to repeat certain labs or tests as needed. Do not drive, operate heavy machinery, perform activities at heights, swimming or participation in water activities or provide baby sitting services if your were admitted for syncope or siezures until you have seen by Primary MD or a Neurologist and advised to do so again. [place] Controlled Substance Reporting System database was reviewed. Do not drive, operate heavy machinery, perform activities at heights, swim, participate in water activities or  provide baby-sitting services while on medications for pain, sleep and mood until your outpatient physician has reevaluated you and advised to do so again.  You are strongly recommended to comply with the dose, frequency and duration of prescribed medications. Activity: As tolerated with Full fall precautions use walker/cane & assistance as needed Avoid using any recreational substances like cigarette, tobacco, alcohol, or non-prescribed drug. If you experience worsening of your admission symptoms, develop shortness of breath, life threatening emergency, suicidal or homicidal thoughts you must seek medical attention immediately by calling 911 or calling your MD immediately  if symptoms less severe. You must read complete instructions/literature along with all the possible adverse reactions/side effects for all the medicines you take and that have been prescribed to you. Take any new medicine only after you have completely understood and accepted all the possible adverse reactions/side effects.  Wear Seat belts while driving. You were cared for by a hospitalist during your hospital stay. If you have any questions about your discharge medications or the care you received while you were in the hospital after you are discharged, you can call the unit and ask to speak with the hospitalist or the covering physician. Once you are discharged, your primary care physician will handle any further medical issues. Please note that NO REFILLS for any discharge medications will be authorized once you are discharged, as it is imperative that you return to your primary care physician (or establish a relationship with a primary care physician if you do not have one).   Increase activity slowly   Complete by: As directed        Discharge Medications:   Allergies as of 03/19/2023       Reactions   Morphine And Codeine Itching   Possible itching due to morphine 01/26/21   Cortisone    Due to diabetes   Other Other  (See Comments)   Cats- eyes burn        Medication List     STOP taking these medications    allopurinol 100 MG tablet Commonly known as: ZYLOPRIM   amLODipine 5 MG tablet Commonly known as: NORVASC   celecoxib 100 MG capsule Commonly known as: CeleBREX   DULoxetine 20 MG capsule Commonly known as: Cymbalta   HumaLOG KwikPen 200 UNIT/ML KwikPen Generic drug: insulin lispro   Iron (Ferrous Sulfate) 325 (65 Fe) MG Tabs   potassium chloride 10 MEQ tablet Commonly known as: KLOR-CON M   ZTlido 1.8 % Ptch  Generic drug: Lidocaine       TAKE these medications    acetaminophen 500 MG tablet Commonly known as: TYLENOL Take 1,000 mg by mouth every 8 (eight) hours as needed for moderate pain (pain score 4-6).   albuterol 108 (90 Base) MCG/ACT inhaler Commonly known as: VENTOLIN HFA Inhale 2 puffs into the lungs every 4 (four) hours as needed. For breathing.   Anoro Ellipta 62.5-25 MCG/ACT Aepb Generic drug: umeclidinium-vilanterol USE 1 INHALATION DAILY What changed: See the new instructions.   aspirin EC 81 MG tablet Take 1 tablet (81 mg total) by mouth daily.   FreeStyle Libre 3 Reader [employer] Use with freestyle libre 3 sensor   [employer] 3 Sensor Misc   gabapentin 300 MG capsule Commonly known as: NEURONTIN Take 2 capsules (600 mg total) by mouth 3 (three) times daily. For back and foot pain.   HYDROcodone-acetaminophen 5-325 MG tablet Commonly known as: NORCO/VICODIN Take 1-2 tablets by mouth in the morning AND 1 tablet daily at 12 noon AND 1 tablet at bedtime.   Insulin Pen Needle 32G X 4 MM Misc Use daily with insulin as directed   metFORMIN 500 MG 24 hr tablet Commonly known as: GLUCOPHAGE-XR Take 2 tablets (1,000 mg total) by mouth 2 (two) times daily.   metoprolol tartrate 25 MG tablet Commonly known as: LOPRESSOR Take 1 tablet (25 mg total) by mouth 2 (two) times daily.   NovoLOG FlexPen 100 UNIT/ML FlexPen Generic drug: insulin  aspart Premeal three times a day CBG < 70: take sugary drinks; CBG 70 - 120: 0 units; CBG 121 - 150: 1 unit; CBG 151 - 200: 2 units; CBG 201 - 250: 3 units; CBG 251 - 300: 5 units; CBG 301 - 350: 7 units; CBG 351 - 400: 9 units CBG > 400: call your doctor   OneTouch Verio test strip Generic drug: glucose blood Use to check blood glucose 3 times daily   pantoprazole 40 MG tablet Commonly known as: PROTONIX TAKE 1 TABLET DAILY (REPLACES OMEPRAZOLE) What changed: See the new instructions.   rosuvastatin 20 MG tablet Commonly known as: CRESTOR Take 1 tablet (20 mg total) by mouth daily.   Toujeo SoloStar 300 UNIT/ML Solostar Pen Generic drug: insulin glargine (1 Unit Dial) Inject 10 Units into the skin daily at 6 (six) AM. What changed:  how much to take when to take this   zolpidem 10 MG tablet Commonly known as: AMBIEN Take 1 tablet (10 mg total) by mouth at bedtime as needed for sleep         The results of significant diagnostics from this hospitalization (including imaging, microbiology, ancillary and laboratory) are listed below for reference.    Procedures and Diagnostic Studies:   CT Chest W Contrast  Result Date: 03/17/2023 CLINICAL DATA:  Respiratory illness. EXAM: CT CHEST WITH CONTRAST TECHNIQUE: Multidetector CT imaging of the chest was performed during intravenous contrast administration. RADIATION DOSE REDUCTION: This exam was performed according to the departmental dose-optimization program which includes automated exposure control, adjustment of the mA and/or kV according to patient size and/or use of iterative reconstruction technique. CONTRAST:  75mL OMNIPAQUE IOHEXOL 350 MG/ML SOLN COMPARISON:  Chest x-ray earlier 03/17/2023.  CT 01/09/2021. FINDINGS: Cardiovascular: Status post median sternotomy. Heart is nonenlarged. No pericardial effusion. Coronary artery calcifications are seen. Thoracic aorta has a normal course and caliber. Scattered partially calcified  irregular plaque identified. There is a bovine type aortic arch, a normal variant. Plaque also seen along the great  vessels. Mediastinum/Nodes: Normal caliber thoracic esophagus. Preserved thyroid gland. No specific abnormal lymph node enlargement identified in the axillary regions or hilum. Few small less than 1 cm size in short axis mediastinal nodes are seen, nonpathologic by size criteria and similar distribution to the prior CT scan Lungs/Pleura: There is some linear opacity seen along bases likely scar or atelectasis. No consolidation, pneumothorax or effusion. Areas of interstitial septal thickening as well 2 mm nodule right lung on series 7, image 76. Unchanged from previous demonstrating over 2 years of stability. No specific imaging follow up centrilobular emphysematous lung changes identified. Upper Abdomen: Fatty liver infiltration with a nodular contour. Please correlate with any evidence of chronic liver disease. There are some varices in the upper abdomen. Adrenal glands are preserved. There is punctate areas of macroscopic fat in the right adrenal gland. Myelolipoma. Question slight fold thickening along the visualized portions of the stomach. Please correlate with any symptoms. Musculoskeletal: Curvature and degenerative changes of the spine. Fixation hardware along the lower cervical spine at the edge of the imaging field. IMPRESSION: Postop chest. Basilar atelectasis with some areas of scarring and fibrotic changes. No consolidation or effusion. Nodular fatty liver. Please correlate for any evidence of chronic liver disease. There is also some varices. Slight wall thickening of the gastric folds. Please correlate with any symptoms. Aortic Atherosclerosis (ICD10-I70.0) and Emphysema (ICD10-J43.9). Electronically Signed   By: Karen Kays M.D.   On: 03/17/2023 17:03   DG Chest 2 View  Result Date: 03/17/2023 CLINICAL DATA:  Shortness of breath and cough EXAM: CHEST - 2 VIEW COMPARISON:   04/15/2021 x-ray FINDINGS: No consolidation, pneumothorax or effusion. No edema. Normal cardiopericardial silhouette. Sternal wires. Overlapping cardiac leads. Fixation hardware along the lower cervical spine. Advanced degenerative changes of the left shoulder. Presumed coronary stents. Degenerative changes of the spine on lateral view. IMPRESSION: Postop chest.  No acute cardiopulmonary disease. Electronically Signed   By: Karen Kays M.D.   On: 03/17/2023 13:24     Labs:   Basic Metabolic Panel: Recent Labs  Lab 03/17/23 1320 03/18/23 0529 03/19/23 0805  NA 133* 136 140  K 4.4 3.7 4.0  CL 99 106 109  CO2 21* 22 23  GLUCOSE 208* 168* 120*  BUN 35* 30* 22  CREATININE 1.37* 1.34* 1.15  CALCIUM 9.1 9.2 9.0  MG  --  1.9  --    GFR Estimated Creatinine Clearance: 55.4 mL/min (by C-G formula based on SCr of 1.15 mg/dL). Liver Function Tests: Recent Labs  Lab 03/17/23 1320  AST 37  ALT 26  ALKPHOS 106  BILITOT 1.3*  PROT 6.9  ALBUMIN 3.8   No results for input(s): "LIPASE", "AMYLASE" in the last 168 hours. Recent Labs  Lab 03/19/23 0805  AMMONIA 15   Coagulation profile No results for input(s): "INR", "PROTIME" in the last 168 hours.  CBC: Recent Labs  Lab 03/17/23 1239 03/18/23 0529 03/19/23 0805  WBC 13.1* 9.2 6.9  NEUTROABS 10.5*  --  3.6  HGB 16.3 14.9 14.0  HCT 48.6 45.2 42.1  MCV 92.4 92.6 93.3  PLT 195 134* 121*   Cardiac Enzymes: No results for input(s): "CKTOTAL", "CKMB", "CKMBINDEX", "TROPONINI" in the last 168 hours. BNP: Invalid input(s): "POCBNP" CBG: Recent Labs  Lab 03/18/23 2014 03/18/23 2318 03/19/23 0625 03/19/23 0825 03/19/23 1247  GLUCAP 128* 108* 113* 121* 211*   D-Dimer No results for input(s): "DDIMER" in the last 72 hours. Hgb A1c No results for input(s): "HGBA1C" in the last  72 hours. Lipid Profile No results for input(s): "CHOL", "HDL", "LDLCALC", "TRIG", "CHOLHDL", "LDLDIRECT" in the last 72 hours. Thyroid function  studies No results for input(s): "TSH", "T4TOTAL", "T3FREE", "THYROIDAB" in the last 72 hours.  Invalid input(s): "FREET3" Anemia work up No results for input(s): "VITAMINB12", "FOLATE", "FERRITIN", "TIBC", "IRON", "RETICCTPCT" in the last 72 hours. Microbiology Recent Results (from the past 240 hour(s))  Resp panel by RT-PCR (RSV, Flu A&B, Covid) Anterior Nasal Swab     Status: None   Collection Time: 03/17/23 12:39 PM   Specimen: Anterior Nasal Swab  Result Value Ref Range Status   SARS Coronavirus 2 by RT PCR NEGATIVE NEGATIVE Final   Influenza A by PCR NEGATIVE NEGATIVE Final   Influenza B by PCR NEGATIVE NEGATIVE Final    Comment: (NOTE) The Xpert Xpress SARS-CoV-2/FLU/RSV plus assay is intended as an aid in the diagnosis of influenza from Nasopharyngeal swab specimens and should not be used as a sole basis for treatment. Nasal washings and aspirates are unacceptable for Xpert Xpress SARS-CoV-2/FLU/RSV testing.  Fact Sheet for Patients: BloggerCourse.com  Fact Sheet for Healthcare Providers: SeriousBroker.it  This test is not yet approved or cleared by the [place] FDA and has been authorized for detection and/or diagnosis of SARS-CoV-2 by FDA under an Emergency Use Authorization (EUA). This EUA will remain in effect (meaning this test can be used) for the duration of the COVID-19 declaration under Section 564(b)(1) of the Act, 21 U.S.C. section 360bbb-3(b)(1), unless the authorization is terminated or revoked.     Resp Syncytial Virus by PCR NEGATIVE NEGATIVE Final    Comment: (NOTE) Fact Sheet for Patients: BloggerCourse.com  Fact Sheet for Healthcare Providers: SeriousBroker.it  This test is not yet approved or cleared by the [place] FDA and has been authorized for detection and/or diagnosis of SARS-CoV-2 by FDA under an Emergency Use Authorization (EUA).  This EUA will remain in effect (meaning this test can be used) for the duration of the COVID-19 declaration under Section 564(b)(1) of the Act, 21 U.S.C. section 360bbb-3(b)(1), unless the authorization is terminated or revoked.  Performed at [facility], [address], [place], [place] [address]     Time coordinating discharge: 45 minutes  Signed: Nekeisha Aure  [facility] 03/19/2023, 1:58 PM

## 2023-03-19 NOTE — Progress Notes (Signed)
Transition of Care [facility]) - Inpatient Brief Assessment   Patient Details  Name: Paul [name] MRN: [id number] Date of Birth: [birth year]  Transition of Care [facility] CM/SW Contact:    Janae Bridgeman, RN Phone Number: 03/19/2023, 2:47 PM   Clinical Narrative: CM met with the patient prior to discharge.  The patient admitted to the hospital for generalized weakness and plans to return to home today.  The patient states that he is currently receiving Outpatient therapy but home health would be more convenient for appointments for therapy.  I offered Medicare choice to the patient regarding home health services and he did not have a preference.  I called [facility] and Elnita Maxwell, [place] accepted for services.  OT was added to the current order and attending MD asked to co-sign.  No other TOC needs at this time.  Patient has transportation to home.   Transition of Care Asessment: Insurance and Status: (P) Insurance coverage has been reviewed Patient has primary care physician: (P) Yes Home environment has been reviewed: (P) from home Prior level of function:: (P) Independent Prior/Current Home Services: (P) No current home services Social Determinants of Health Reivew: (P) SDOH reviewed interventions complete Readmission risk has been reviewed: (P) Yes Transition of care needs: (P) transition of care needs identified, TOC will continue to follow

## 2023-03-20 ENCOUNTER — Other Ambulatory Visit (HOSPITAL_COMMUNITY): Payer: Self-pay

## 2023-03-21 ENCOUNTER — Other Ambulatory Visit (HOSPITAL_BASED_OUTPATIENT_CLINIC_OR_DEPARTMENT_OTHER): Payer: Self-pay | Admitting: Nurse Practitioner

## 2023-03-21 DIAGNOSIS — Z794 Long term (current) use of insulin: Secondary | ICD-10-CM

## 2023-03-22 ENCOUNTER — Other Ambulatory Visit (INDEPENDENT_AMBULATORY_CARE_PROVIDER_SITE_OTHER): Payer: Medicare Other

## 2023-03-22 ENCOUNTER — Other Ambulatory Visit: Payer: Medicare Other

## 2023-03-22 DIAGNOSIS — E538 Deficiency of other specified B group vitamins: Secondary | ICD-10-CM

## 2023-03-22 MED ORDER — CYANOCOBALAMIN 1000 MCG/ML IJ SOLN
1000.0000 ug | Freq: Once | INTRAMUSCULAR | Status: AC
  Administered 2023-03-22: 1000 ug via INTRAMUSCULAR

## 2023-03-22 NOTE — Progress Notes (Signed)
03/22/2023 Name: Paul [name] MRN: [id number] DOB: 1950-11-13  No chief complaint on file.   Paul [name] is a 72 y.o. year old male who presented for a telephone visit.   They were referred to the pharmacist by their PCP for assistance in managing complex medication management.    Subjective:  Care Team: Primary Care Provider: Tollie Eth, NP ; Next Scheduled Visit: 03/14/23  Medication Access/Adherence  Current Pharmacy:  [facility] [address] [place] [address] Phone: [phone] Fax: [phone]  [facility] - [place], [place] - [address] [place] [address] Phone: [phone] Fax: [phone]   Patient reports affordability concerns with their medications: No  Patient reports access/transportation concerns to their pharmacy: No  Patient reports adherence concerns with their medications:  No     Diabetes:  Current medications: Novolog ss, Toujeo 10 units, Metformin XR 1000mg  BID (as of 03/19/23 hospital d/c) Medications tried in the past: Glipizide  Current glucose readings:    Patient denies hypoglycemic s/sx including dizziness, shakiness, sweating. Patient denies hyperglycemic symptoms including polyuria, polydipsia, polyphagia, nocturia, neuropathy, blurred vision over the last 2 days.  Hypertension:  Current medications: Amlodipine 5mg , Metoprolol tartrate 25mg  Medications previously tried: Lasix  Patient has a validated, automated, upper arm home BP cuff Current blood pressure readings readings: Not checking, 140/74 in office today  Patient denies hypotensive s/sx including dizziness, lightheadedness.  Patient denies hypertensive symptoms including headache, chest pain, shortness of breath Over the last 2 days   Hyperlipidemia/ASCVD Risk Reduction  Current lipid lowering medications: Rosuvastatin 20mg  (unsure if taking) Medications tried in the  past: Atorvastatin  Antiplatelet regimen: Aspirin 81mg     Objective:  Lab Results  Component Value Date   HGBA1C 7.1 (H) 01/11/2023    Lab Results  Component Value Date   CREATININE 1.15 03/19/2023   BUN 22 03/19/2023   NA 140 03/19/2023   K 4.0 03/19/2023   CL 109 03/19/2023   CO2 23 03/19/2023    Lab Results  Component Value Date   CHOL 76 (L) 01/11/2023   HDL 23 (L) 01/11/2023   LDLCALC 15 01/11/2023   TRIG 255 (H) 01/11/2023   CHOLHDL 3.3 01/11/2023    Medications Reviewed Today     Reviewed by Sherrill Raring, RPH (Pharmacist) on 03/22/23 at 1428  Med List Status: <None>   Medication Order Taking? Sig Documenting Provider Last Dose Status Informant  acetaminophen (TYLENOL) 500 MG tablet [id number] Yes Take 1,000 mg by mouth every 8 (eight) hours as needed for moderate pain (pain score 4-6). [provider] Taking Active Self, Pharmacy Records           Med Note Arby Barrette   ATF Mar 17, 2023  2:52 PM)    albuterol (VENTOLIN HFA) 108 (90 Base) MCG/ACT inhaler [id number] Yes Inhale 2 puffs into the lungs every 4 (four) hours as needed. For breathing. Lorin Glass, MD Taking Active   aspirin 81 MG EC tablet [id number] Yes Take 1 tablet (81 mg total) by mouth daily.  Taking Active Self, Pharmacy Records  Continuous Glucose Receiver (FREESTYLE LIBRE 3 READER) DEVI [id number] Yes Use with freestyle libre 3 sensor Tysinger, [name], PA-C Taking Active Self, Pharmacy Records  Continuous Glucose Sensor (FREESTYLE LIBRE 3 [place]) [place] [id number] Yes  [provider] Taking Active Self, Pharmacy Records  gabapentin (NEURONTIN) 300 MG capsule [id number] Yes Take 2 capsules (600 mg total)  by mouth 3 (three) times daily. For back and foot pain. Tollie Eth, NP Taking Active Self, Pharmacy Records  glucose blood [facility] VERIO) test strip [id number] Yes Use to check blood glucose 3 times daily  Taking Active Self, Pharmacy Records  HYDROcodone-acetaminophen  (NORCO/VICODIN) 5-325 MG tablet [id number] Yes Take 1-2 tablets by mouth in the morning AND 1 tablet daily at 12 noon AND 1 tablet at bedtime.  Taking Active Self, Pharmacy Records  insulin aspart (NOVOLOG FLEXPEN) 100 UNIT/ML FlexPen [id number] Yes Use before meals three times a day. CBG < 70: take sugary drinks; CBG 70 - 120: 0 units; CBG 121 - 150: 1 unit; CBG 151 - 200: 2 units; CBG 201 - 250: 3 units; CBG 251 - 300: 5 units; CBG 301 - 350: 7 units; CBG 351 - 400: 9 units CBG > 400: call your doctor Dahal, Melina Schools, MD Taking Active   insulin glargine, 1 Unit Dial, (TOUJEO SOLOSTAR) 300 UNIT/ML Solostar Pen [id number] Yes Inject 10 Units into the skin daily at 6 (six) AM. Lorin Glass, MD Taking Active   Insulin Pen Needle 32G X 4 MM MISC [id number] Yes Use daily with insulin as directed Early, Sung Amabile, NP Taking Active Self, Pharmacy Records  metFORMIN (GLUCOPHAGE-XR) 500 MG 24 hr tablet [id number] Yes Take 2 tablets (1,000 mg total) by mouth 2 (two) times daily. Tollie Eth, NP Taking Active Self, Pharmacy Records           Med Note Arby Barrette   LOV Mar 17, 2023  3:00 PM) PT states he thinks his MD upped this to 3 tablets twice a day, but then said it went from 200 to 300, PT unsure of dosing but states he is taking this after surgery  metoprolol tartrate (LOPRESSOR) 25 MG tablet [id number] Yes Take 1 tablet (25 mg total) by mouth 2 (two) times daily. Tollie Eth, NP Taking Active Self, Pharmacy Records           Med Note Arby Barrette   OAC Mar 17, 2023  2:50 PM) PT unsure if taking this med or if it is on hold from surgery 03/06/23  pantoprazole (PROTONIX) 40 MG tablet [id number] Yes TAKE 1 TABLET DAILY (REPLACES OMEPRAZOLE) Early, Sung Amabile, NP Taking Active   rosuvastatin (CRESTOR) 20 MG tablet [id number]  Take 1 tablet (20 mg total) by mouth daily.  Patient not taking: Reported on 03/17/2023   [place], [name], MD  Active Self, Pharmacy Records  umeclidinium-vilanterol [facility] ELLIPTA) 62.5-25  MCG/ACT AEPB [id number] Yes USE 1 INHALATION DAILY Early, Sung Amabile, NP Taking Active   zolpidem (AMBIEN) 10 MG tablet [id number] Yes Take 1 tablet (10 mg total) by mouth at bedtime as needed for sleep Early, Sung Amabile, NP Taking Active Self, Pharmacy Records              Assessment/Plan:   Diabetes: - Currently uncontrolled - Reviewed long term cardiovascular and renal outcomes of uncontrolled blood sugar - Reviewed goal A1c, goal fasting, and goal 2 hour post prandial glucose - Reviewed dietary modifications including low carb diet: - Recommend to continue current insulin regimen (started 2 days ago)  - Recommend to check glucose at least TID with sensors  Hypertension: - Currently uncontrolled - Reviewed long term cardiovascular and renal outcomes of uncontrolled blood pressure - Reviewed appropriate blood pressure monitoring technique and reviewed goal blood pressure. Recommended to check home blood pressure and heart rate daily x 1 week  Hyperlipidemia/ASCVD Risk Reduction: - Currently controlled.  -Recommend to continue rosuvastatin, please make sure you have at home and are taking     Follow Up Plan: 1 week  Sherrill Raring, PharmD Clinical Pharmacist [phone]

## 2023-03-23 ENCOUNTER — Other Ambulatory Visit: Payer: Self-pay

## 2023-03-23 ENCOUNTER — Other Ambulatory Visit (HOSPITAL_COMMUNITY): Payer: Self-pay

## 2023-03-23 MED ORDER — NOVOLOG FLEXPEN RELION 100 UNIT/ML ~~LOC~~ SOPN: PEN_INJECTOR | SUBCUTANEOUS | 11 refills | Status: DC

## 2023-03-23 MED ORDER — NOVOLOG FLEXPEN RELION 100 UNIT/ML ~~LOC~~ SOPN
PEN_INJECTOR | SUBCUTANEOUS | 11 refills | Status: DC
  Filled 2023-03-23: qty 15, fill #0

## 2023-03-26 ENCOUNTER — Telehealth: Payer: Self-pay

## 2023-03-26 DIAGNOSIS — E1122 Type 2 diabetes mellitus with diabetic chronic kidney disease: Secondary | ICD-10-CM

## 2023-03-26 NOTE — Telephone Encounter (Signed)
Request regarding novolog flexpen  Covered alternatives: - humalog kwikpen U100 5'S LILLY - insulin lispro (brand) pen 3 ML 5S ELI LILLY

## 2023-03-27 ENCOUNTER — Encounter: Payer: Medicare Other | Admitting: Physician Assistant

## 2023-03-28 ENCOUNTER — Encounter: Payer: Self-pay | Admitting: Surgical

## 2023-03-28 ENCOUNTER — Ambulatory Visit: Payer: Medicare Other | Admitting: Surgical

## 2023-03-28 VITALS — BP 143/63 | HR 62

## 2023-03-28 DIAGNOSIS — C44629 Squamous cell carcinoma of skin of left upper limb, including shoulder: Secondary | ICD-10-CM

## 2023-03-28 NOTE — Progress Notes (Signed)
   Referring Provider Early, Sung Amabile, NP [address] [place],  [place] [address]   CC:  Chief Complaint  Patient presents with   Post-op Follow-up      Paul [name] is an 72 y.o. male.  HPI: Patient is a 72 year old male with history of left hand skin lesion which was excised with Dr. Ladona [name] on 03/06/2023. 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  Carcinoma 0.1 cm from deep margin.  He has been doing dressing changes with surgical lubricant, was, Kerlix and Ace wrap's.  He reports it has been healing well.  He is not having any specific issues.  I did ask him about his referral to dermatology today and he seemed confused about this.  He reports that he forgot about it.  I did review referrals and EMR and it shows that the referral is still pending.   Review of Systems General: Fevers or chills  Physical Exam    03/28/2023   10:16 AM 03/19/2023    8:24 AM 03/18/2023   11:19 PM  Vitals with BMI  Systolic 143 143 829  Diastolic 63 110 75  Pulse 62 71 70    General:  No acute distress,  Alert and oriented, Non-Toxic, Normal speech and affect Left hand: Left hand wound with good granulation tissue noted.  There is no surrounding erythema or cellulitic changes noted.  No distal extremity swelling is noted.  Normal range of motion and normal cascade of left hand is noted.  No active drainage noted.  Assessment/Plan Patient is a 72 year old male status post excision of left hand skin lesion which had pathology positive for invasive well-differentiated squamous cell carcinoma with a close deep margin of 0.1 cm.  He was previously referred to dermatology which is pending review at this time.  We discussed the importance of following up with dermatology for their opinion to determine if additional excision for deep margin will be necessary.  There is no signs of infection or concern on exam, we discussed  continuing with K-Y jelly, 4 4 gauze or 2 x 2 gauze and Kerlix, Ace wrap dressing changes.  We also discussed that he could use Vaseline if he did not have surgical lubricant or K-Y jelly.  Recommend following up in 2 to 3 weeks for reevaluation, there is no signs infection or concern on exam.  Recommend calling with questions or concerns.  Paul [name] 03/28/2023, 11:02 AM

## 2023-03-29 ENCOUNTER — Other Ambulatory Visit (HOSPITAL_COMMUNITY): Payer: Self-pay

## 2023-03-29 ENCOUNTER — Other Ambulatory Visit: Payer: Medicare Other

## 2023-03-29 VITALS — BP 109/72

## 2023-03-29 DIAGNOSIS — E1159 Type 2 diabetes mellitus with other circulatory complications: Secondary | ICD-10-CM

## 2023-03-29 NOTE — Progress Notes (Signed)
03/29/2023 Name: Paul [name] MRN: [id number] DOB: 1951/03/09  Chief Complaint  Patient presents with   Diabetes   Hypertension   Medication Management    Paul [name] is a 72 y.o. year old male who presented for a telephone visit.   They were referred to the pharmacist by their PCP for assistance in managing complex medication management.    Subjective:  Care Team: Primary Care Provider: Tollie Eth, NP ; Next Scheduled Visit: 03/14/23  Medication Access/Adherence  Current Pharmacy:  [facility] - [facility] [address] [place] [address] Phone: [phone] Fax: [phone]  [facility] - [place], [place] - [address] [place] [address] Phone: [phone] Fax: [phone]   Patient reports affordability concerns with their medications: No  Patient reports access/transportation concerns to their pharmacy: No  Patient reports adherence concerns with their medications:  No     Diabetes:  Current medications: Novolog ss, Toujeo 10 units, Metformin XR 1000mg  BID Medications tried in the past: Glipizide  Current glucose readings:    Patient denies hypoglycemic s/sx including dizziness, shakiness, sweating. Patient denies hyperglycemic symptoms including polyuria, polydipsia, polyphagia, nocturia, neuropathy, blurred vision   Hypertension:  Current medications: Metoprolol tartrate 25mg  Medications previously tried: Lasix, Amlodipine  Patient has a validated, automated, upper arm home BP cuff Current blood pressure readings readings: checked today during the appt ,109/72 Patient denies hypotensive s/sx including dizziness, lightheadedness.  Patient denies hypertensive symptoms including headache, chest pain, shortness of breath Over the last 2 days   Hyperlipidemia/ASCVD Risk Reduction  Current lipid lowering medications: Rosuvastatin 20mg  Medications tried in the past:  Atorvastatin  Antiplatelet regimen: Aspirin 81mg     Objective:  Lab Results  Component Value Date   HGBA1C 7.1 (H) 01/11/2023    Lab Results  Component Value Date   CREATININE 1.15 03/19/2023   BUN 22 03/19/2023   NA 140 03/19/2023   K 4.0 03/19/2023   CL 109 03/19/2023   CO2 23 03/19/2023    Lab Results  Component Value Date   CHOL 76 (L) 01/11/2023   HDL 23 (L) 01/11/2023   LDLCALC 15 01/11/2023   TRIG 255 (H) 01/11/2023   CHOLHDL 3.3 01/11/2023    Medications Reviewed Today     Reviewed by Sherrill Raring, RPH (Pharmacist) on 03/29/23 at 1319  Med List Status: <None>   Medication Order Taking? Sig Documenting Provider Last Dose Status Informant  acetaminophen (TYLENOL) 500 MG tablet [id number] No Take 1,000 mg by mouth every 8 (eight) hours as needed for moderate pain (pain score 4-6). [provider] Taking Active Self, Pharmacy Records           Med Note Arby Barrette   DGL Mar 17, 2023  2:52 PM)    albuterol (VENTOLIN HFA) 108 (90 Base) MCG/ACT inhaler [id number] No Inhale 2 puffs into the lungs every 4 (four) hours as needed. For breathing. Lorin Glass, MD Taking Active   aspirin 81 MG EC tablet [id number] No Take 1 tablet (81 mg total) by mouth daily.  Taking Active Self, Pharmacy Records  Continuous Glucose Receiver (FREESTYLE LIBRE 3 READER) DEVI [id number] No Use with freestyle libre 3 sensor Tysinger, [name], PA-C Taking Active Self, Pharmacy Records  Continuous Glucose Sensor (FREESTYLE LIBRE 3 [place]) [place] [id number] No  [provider] Taking Active Self, Pharmacy Records  gabapentin (NEURONTIN) 300 MG capsule [id number] No Take 2 capsules (600 mg total) by mouth  3 (three) times daily. For back and foot pain. Tollie Eth, NP Taking Active Self, Pharmacy Records  glucose blood [facility] VERIO) test strip [id number] No Use to check blood glucose 3 times daily  Taking Active Self, Pharmacy Records  HYDROcodone-acetaminophen (NORCO/VICODIN)  5-325 MG tablet [id number] No Take 1-2 tablets by mouth in the morning AND 1 tablet daily at 12 noon AND 1 tablet at bedtime.  Taking Active Self, Pharmacy Records  insulin aspart (NOVOLOG FLEXPEN) 100 UNIT/ML FlexPen [id number] No Use before meals three times a day. CBG < 70: take sugary drinks; CBG 70 - 120: 0 units; CBG 121 - 150: 1 unit; CBG 151 - 200: 2 units; CBG 201 - 250: 3 units; CBG 251 - 300: 5 units; CBG 301 - 350: 7 units; CBG 351 - 400: 9 units CBG > 400: call your doctor Early, Sung Amabile, NP Taking Active   insulin glargine, 1 Unit Dial, (TOUJEO SOLOSTAR) 300 UNIT/ML Solostar Pen [id number] No Inject 10 Units into the skin daily at 6 (six) AM. Lorin Glass, MD Taking Active   Insulin Pen Needle 32G X 4 MM MISC [id number] No Use daily with insulin as directed Early, Sung Amabile, NP Taking Active Self, Pharmacy Records  metFORMIN (GLUCOPHAGE-XR) 500 MG 24 hr tablet [id number] No Take 2 tablets (1,000 mg total) by mouth 2 (two) times daily. Tollie Eth, NP Taking Active Self, Pharmacy Records           Med Note Arby Barrette   VQQ Mar 17, 2023  3:00 PM) PT states he thinks his MD upped this to 3 tablets twice a day, but then said it went from 200 to 300, PT unsure of dosing but states he is taking this after surgery  metoprolol tartrate (LOPRESSOR) 25 MG tablet [id number] No Take 1 tablet (25 mg total) by mouth 2 (two) times daily. Tollie Eth, NP Taking Active Self, Pharmacy Records           Med Note Arby Barrette   EPP Mar 17, 2023  2:50 PM) PT unsure if taking this med or if it is on hold from surgery 03/06/23  pantoprazole (PROTONIX) 40 MG tablet [id number] No TAKE 1 TABLET DAILY (REPLACES OMEPRAZOLE) Early, Sung Amabile, NP Taking Active   rosuvastatin (CRESTOR) 20 MG tablet [id number] No Take 1 tablet (20 mg total) by mouth daily. [place], [name], MD Taking Active Self, Pharmacy Records  umeclidinium-vilanterol [facility] ELLIPTA) 62.5-25 MCG/ACT AEPB [id number] No USE 1 INHALATION DAILY Early, Sung Amabile,  NP Taking Active   zolpidem (AMBIEN) 10 MG tablet [id number] No Take 1 tablet (10 mg total) by mouth at bedtime as needed for sleep Early, Sung Amabile, NP Taking Active Self, Pharmacy Records              Assessment/Plan:   Diabetes: - Currently uncontrolled - Reviewed long term cardiovascular and renal outcomes of uncontrolled blood sugar - Reviewed goal A1c, goal fasting, and goal 2 hour post prandial glucose - Reviewed dietary modifications including low carb diet: - Recommend to continue current insulin regimen (started 9 days ago), still having some highs and lows as patient continues to adjust to [facility] and notice how his body is responding to carbs with his sensors. Resistant to any dose increases to address highs prior to meeting with PCP (fearful of lows) - Recommend to check glucose at least TID with sensors -Future consideration: Consider addition of SGLT2 or GLP after hospital f/u with PCP  Hypertension: - Currently controlled  at home - Reviewed long term cardiovascular and renal outcomes of uncontrolled blood pressure - Reviewed appropriate blood pressure monitoring technique and reviewed goal blood pressure. Recommended to check home blood pressure and heart rate at least once to twice weekly -Continue current medication therapy, if BP begins to elevate, consider re-addition of Amlodipine at lower dose 2.5mg  OR addition of ACEI/ARB for kidney protection in the presence of diabetes           Hyperlipidemia/ASCVD Risk Reduction: - Currently controlled.  -Recommend to continue rosuvastatin, please make sure you have at home and are taking     Follow Up Plan: 2 months (sees PCP in 1 month)  Sherrill Raring, PharmD Clinical Pharmacist [phone]

## 2023-03-30 ENCOUNTER — Other Ambulatory Visit: Payer: Self-pay

## 2023-03-30 ENCOUNTER — Other Ambulatory Visit (HOSPITAL_COMMUNITY): Payer: Self-pay

## 2023-03-30 DIAGNOSIS — E1142 Type 2 diabetes mellitus with diabetic polyneuropathy: Secondary | ICD-10-CM

## 2023-03-30 DIAGNOSIS — J449 Chronic obstructive pulmonary disease, unspecified: Secondary | ICD-10-CM

## 2023-03-30 DIAGNOSIS — I739 Peripheral vascular disease, unspecified: Secondary | ICD-10-CM

## 2023-03-30 DIAGNOSIS — I25118 Atherosclerotic heart disease of native coronary artery with other forms of angina pectoris: Secondary | ICD-10-CM

## 2023-03-30 DIAGNOSIS — I509 Heart failure, unspecified: Secondary | ICD-10-CM

## 2023-03-30 DIAGNOSIS — F419 Anxiety disorder, unspecified: Secondary | ICD-10-CM

## 2023-03-30 DIAGNOSIS — Z794 Long term (current) use of insulin: Secondary | ICD-10-CM

## 2023-03-30 MED ORDER — FREESTYLE LIBRE 3 SENSOR MISC: 1.0000 | 1 refills | Status: DC

## 2023-04-02 ENCOUNTER — Other Ambulatory Visit (HOSPITAL_COMMUNITY): Payer: Self-pay

## 2023-04-02 MED ORDER — HYDROCODONE-ACETAMINOPHEN 5-325 MG PO TABS
ORAL_TABLET | ORAL | 0 refills | Status: DC
  Filled 2023-05-02: qty 120, 30d supply, fill #0

## 2023-04-02 MED ORDER — HYDROCODONE-ACETAMINOPHEN 5-325 MG PO TABS
1.0000 | ORAL_TABLET | Freq: Every morning | ORAL | 0 refills | Status: DC
  Filled 2023-04-02: qty 120, 30d supply, fill #0

## 2023-04-02 MED ORDER — HYDROCODONE-ACETAMINOPHEN 5-325 MG PO TABS
1.0000 | ORAL_TABLET | Freq: Every morning | ORAL | 0 refills | Status: DC
  Filled 2023-06-01: qty 120, 30d supply, fill #0

## 2023-04-03 ENCOUNTER — Other Ambulatory Visit: Payer: Self-pay | Admitting: Nurse Practitioner

## 2023-04-03 DIAGNOSIS — E1159 Type 2 diabetes mellitus with other circulatory complications: Secondary | ICD-10-CM

## 2023-04-03 MED ORDER — INSULIN LISPRO (1 UNIT DIAL) 100 UNIT/ML (KWIKPEN): PEN_INJECTOR | SUBCUTANEOUS | 11 refills | Status: DC

## 2023-04-03 NOTE — Telephone Encounter (Signed)
Medication changed to Humalog with instructions:  FlexPen Use before meals three times a day. CBG < 70: take sugary drinks; CBG 70 - 120: 0 units; CBG 121 - 150: 1 unit; CBG 151 - 200: 2 units; CBG 201 - 250: 3 units; CBG 251 - 300: 5 units; CBG 301 - 350: 7 units; CBG 351 - 400: 9 units CBG > 400: call your doctor

## 2023-04-09 ENCOUNTER — Other Ambulatory Visit (HOSPITAL_COMMUNITY): Payer: Self-pay

## 2023-04-09 ENCOUNTER — Other Ambulatory Visit: Payer: Self-pay | Admitting: Nurse Practitioner

## 2023-04-09 DIAGNOSIS — F5104 Psychophysiologic insomnia: Secondary | ICD-10-CM

## 2023-04-09 MED ORDER — ZOLPIDEM TARTRATE 10 MG PO TABS
10.0000 mg | ORAL_TABLET | Freq: Every evening | ORAL | 2 refills | Status: DC | PRN
  Filled 2023-04-09: qty 30, 30d supply, fill #0

## 2023-04-09 NOTE — Telephone Encounter (Signed)
Is this okay to refill? 

## 2023-04-13 ENCOUNTER — Encounter: Payer: Medicare Other | Admitting: Physician Assistant

## 2023-04-16 ENCOUNTER — Ambulatory Visit (INDEPENDENT_AMBULATORY_CARE_PROVIDER_SITE_OTHER): Payer: Medicare Other | Admitting: Surgical

## 2023-04-16 DIAGNOSIS — C44629 Squamous cell carcinoma of skin of left upper limb, including shoulder: Secondary | ICD-10-CM

## 2023-04-16 NOTE — Progress Notes (Cosign Needed)
   Referring Provider Early, Sung Amabile, NP [address] [place],  [place] [address]   CC: No chief complaint on file.     Paul [name] is an 72 y.o. male.  HPI: 72 year old male here for follow-up after excision of left hand skin lesion with Dr. Ladona Ridgel on 03/06/2023.  Pathology showed invasive well-differentiated keratinizing squamous cell carcinoma with margins negative for carcinoma.  Carcinoma was 0.1 cm from deep margin.  Patient was previously referred to dermatology due to the squamous cell being 0.1 cm from the deep margin.  Patient reports today that he has an appointment with dermatology, he believes he wrote down the appointment time and date at home.  He reports he has been doing well in regards to healing, he has not been needing to provide any additional wound care to the area as it has completely scabbed over.  Review of Systems General: No fevers or chills  Physical Exam    03/29/2023    1:18 PM 03/28/2023   10:16 AM 03/19/2023    8:24 AM  Vitals with BMI  Systolic 109 143 409  Diastolic 72 63 110  Pulse  62 71    General:  No acute distress,  Alert and oriented, Non-Toxic, Normal speech and affect Left hand: Left hand wound nearly completely healed with new epithelialization noted.  There is no surrounding erythema or cellulitic changes noted.  Normal cascade of all 5 digits.  Normal range of motion of left hand.  There is some scabbing still noted    Assessment/Plan 72 year old male status post excision of left hand squamous cell carcinoma with negative margins.  The deep margin was 0.1 cm from the squamous cell.  He has been subsequently referred to dermatology for their opinion on if reexcision is needed as well as to establish care for routine screenings.  Appreciate their assistance.  I did confirm with the patient that he has an appointment with dermatology on April 24, 2023 at 1:45 PM.  Provided patient with provider's name and address.  I would like  to schedule a telephone visit follow-up with him in 3 weeks to follow-up after he has seen dermatology.  Pictures were obtained of the patient and placed in the chart with the patient's or guardian's permission.    Paul [name] 04/16/2023, 1:02 PM

## 2023-04-24 ENCOUNTER — Ambulatory Visit: Payer: Medicare Other | Admitting: Dermatology

## 2023-04-24 ENCOUNTER — Encounter: Payer: Self-pay | Admitting: Dermatology

## 2023-04-24 VITALS — BP 123/72 | HR 79

## 2023-04-24 DIAGNOSIS — C44629 Squamous cell carcinoma of skin of left upper limb, including shoulder: Secondary | ICD-10-CM

## 2023-04-24 DIAGNOSIS — C4492 Squamous cell carcinoma of skin, unspecified: Secondary | ICD-10-CM

## 2023-04-24 NOTE — Patient Instructions (Signed)

## 2023-04-24 NOTE — Addendum Note (Signed)
 Addended by: Gwenith Daily on: 04/24/2023 01:53 PM   Modules accepted: Level of Service

## 2023-04-24 NOTE — Progress Notes (Addendum)
   New Patient Visit   Subjective  Paul [name] is a 73 y.o. male who presents for the following: positive excision of a SCC on left hand, by plastic surgery (less than 0.1 cm clear deep margin). Patient is here to discuss further treatment.  The patient has spots, moles and lesions to be evaluated, some may be new or changing and the patient may have concern these could be cancer.   The following portions of the chart were reviewed this encounter and updated as appropriate: medications, allergies, medical history  Review of Systems:  No other skin or systemic complaints except as noted in HPI or Assessment and Plan.  Objective  Well appearing patient in no apparent distress; mood and affect are within normal limits.  A focused examination was performed of the following areas:  Left hand  Relevant exam findings are noted in the Assessment and Plan.  Left Dorsal Hand Healing granulation tissue  Assessment & Plan  SQUAMOUS CELL CARCINOMA OF SKIN Left Dorsal Hand Risk: High; Acute Complicated Diagnosis - Plan to schedule for Mohs surgery with me next week - Discussed risks and benefits of Mohs surgery, including risks of bleeding, pain, numbness, scarring, and potential reconstruction including linear closures, grafts, flaps, delayed closure, or secondary intention - Discussed that Mohs surgery has the highest cure rate for treatment of NMSCs (99%) - Discussed expectations of day of surgery including local anesthesia, waiting for lab process to find out pathology results, and same day reconstruction, potentially including flaps and grafts - We discussed the procedure, which involves the removal of cancerous tissue in thin layers, with each layer being examined under a microscope until clear margins are achieved. The patient was informed that this technique is highly effective in treating certain skin cancers, especially those in cosmetically sensitive or challenging areas. Potential  risks, including infection, scarring, and changes in skin appearance, were reviewed. The importance of post-operative care, including wound management and follow-up visits, was emphasized to ensure optimal healing and monitoring for any recurrence. The patient was encouraged to ask any questions and provided with written instructions on the procedure and recovery process - Reviewed that if SCC goes untreated, it can potential metastasize and has a small risk of mortality if left untreated - Reviewed previous pathology report and previous plastic surgery note   Return in about 1 week (around 05/01/2023) for  for Mohs for an SCC on left hand.  Paul [name], Surg Tech III, am acting as scribe for Paul CHRISTELLA HOLY, MD.   Documentation: I have reviewed the above documentation for accuracy and completeness, and I agree with the above.  Paul CHRISTELLA HOLY, MD

## 2023-04-30 ENCOUNTER — Encounter: Payer: Self-pay | Admitting: Dermatology

## 2023-05-01 ENCOUNTER — Encounter: Payer: Self-pay | Admitting: Dermatology

## 2023-05-01 ENCOUNTER — Other Ambulatory Visit: Payer: Self-pay | Admitting: Nurse Practitioner

## 2023-05-01 ENCOUNTER — Ambulatory Visit: Payer: Medicare Other | Admitting: Dermatology

## 2023-05-01 VITALS — BP 132/71 | HR 71 | Temp 98.3°F

## 2023-05-01 DIAGNOSIS — E1142 Type 2 diabetes mellitus with diabetic polyneuropathy: Secondary | ICD-10-CM

## 2023-05-01 DIAGNOSIS — L579 Skin changes due to chronic exposure to nonionizing radiation, unspecified: Secondary | ICD-10-CM

## 2023-05-01 DIAGNOSIS — C4492 Squamous cell carcinoma of skin, unspecified: Secondary | ICD-10-CM

## 2023-05-01 DIAGNOSIS — E1140 Type 2 diabetes mellitus with diabetic neuropathy, unspecified: Secondary | ICD-10-CM

## 2023-05-01 DIAGNOSIS — C44629 Squamous cell carcinoma of skin of left upper limb, including shoulder: Secondary | ICD-10-CM | POA: Diagnosis not present

## 2023-05-01 DIAGNOSIS — L814 Other melanin hyperpigmentation: Secondary | ICD-10-CM

## 2023-05-01 NOTE — Progress Notes (Signed)
 Follow-Up Visit   Subjective  Paul [name] is a 73 y.o. male who presents for the following: Mohs of a Well Differentiated Squamous Cell Carcinoma of the left hand. Previously treated by Plastic Surgery with close/positive margins.  The following portions of the chart were reviewed this encounter and updated as appropriate: medications, allergies, medical history  Review of Systems:  No other skin or systemic complaints except as noted in HPI or Assessment and Plan.  Objective  Well appearing patient in no apparent distress; mood and affect are within normal limits.  A focused examination was performed of the following areas: Left hand Relevant physical exam findings are noted in the Assessment and Plan.     Assessment & Plan   SQUAMOUS CELL CARCINOMA OF SKIN Left Hand Mohs surgery  Consent obtained: written  Anticoagulation: Is the patient taking prescription anticoagulant and/or aspirin  prescribed/recommended by a physician? No   Was the anticoagulation regimen changed prior to Mohs? No    Procedure Details: Surgical site (from skin exam): Left Hand Pre-operative length (cm): 2 Pre-operative width (cm): 1.8  Micrographic Surgery Details: Post-operative length (cm): 3.5 Post-operative width (cm): 3 Number of Mohs stages: 2   Return in about 2 weeks (around 05/15/2023).  [name] Darice Smock, CMA, am acting as scribe for RUFUS CHRISTELLA HOLY, MD.    05/01/2023  HISTORY OF PRESENT ILLNESS  Paul [name] is seen in consultation at the request of Plastic Surgery for biopsy-proven Well Differentiated Squamous Cell Carcinoma, previously treated by plastic surgery, with close/positive margins on the left dorsal hand. They note that the area has been present for about 1 year increasing in size with time.  There is no history of previous treatment.  Reports no other new or changing lesions and has no other complaints today.  Medications and allergies: see patient chart.  Review of  systems: Reviewed 8 systems and notable for the above skin cancer.  All other systems reviewed are unremarkable/negative, unless noted in the HPI. Past medical history, surgical history, family history, social history were also reviewed and are noted in the chart/questionnaire.    PHYSICAL EXAMINATION  General: Well-appearing, in no acute distress, alert and oriented x 4. Vitals reviewed in chart (if available).   Skin: Exam reveals a 2.0 x 1.8 cm erythematous papule and biopsy scar on the left dorsal hand. There are rhytids, telangiectasias, and lentigines, consistent with photodamage.   Biopsy report(s) reviewed, confirming the diagnosis.   ASSESSMENT  1) Well Differentiated Squamous Cell Carcinoma on the left dorsal hand 2) photodamage 3) solar lentigines   PLAN   1. Due to location, size, histology, or recurrence and the likelihood of subclinical extension as well as the need to conserve normal surrounding tissue, the patient was deemed acceptable for Mohs micrographic surgery (MMS).  The nature and purpose of the procedure, associated benefits and risks including recurrence and scarring, possible complications such as pain, infection, and bleeding, and alternative methods of treatment if appropriate were discussed with the patient during consent. The lesion location was verified by the patient, by reviewing previous notes, pathology reports, and by photographs as well as angulation measurements if available.  Informed consent was reviewed and signed by the patient, and timeout was performed at 10 AM. See op note below.  2. For the photodamage and solar lentigines, sun protection discussed/information given on OTC sunscreens, and we recommend continued regular follow-up with primary dermatologist every 6 months or sooner for any growing, bleeding, or changing lesions. 3. Prognosis and  future surveillance discussed. 4. Letter with treatment outcome sent to referring provider. 5. Pain  .acetaminophen /ibuprofen/patient already Noc[facility] from [facility]   MOHS MICROGRAPHIC SURGERY AND RECONSTRUCTION  Initial size:   2.0 x 1.8 cm Surgical defect/wound size: 3.5 x 2.0 cm Anesthesia:    0.33% lidocaine  with 1:200,000 epinephrine  EBL:    <5 mL Complications:  None Repair type:   Full Thickness Skin Graft  Stages: 2  STAGE I: Anesthesia achieved with 0.5% lidocaine  with 1:200,000 epinephrine . ChloraPrep applied. 1 section(s) excised using Mohs technique (this includes total peripheral and deep tissue margin excision and evaluation with frozen sections, excised and interpreted by the same physician). The tumor was first debulked and then excised with an approx. 2 mm margin.  Hemostasis was achieved with electrocautery as needed.  The specimen was then oriented, subdivided/relaxed, inked, and processed using Mohs technique.    Frozen section analysis revealed a positive margin for squamous atypic and broad inflammation in the deep margin.    STAGE II: An additional 2 mm margin was excised.  Hemostasis was achieved with electrocautery as needed.  The specimen was then oriented, subdivided/relaxed, inked, and processed using Mohs technique. Evaluation of slides by the Mohs surgeon revealed clear tumor margins.  Reconstruction  Operation:  Full thickness skin graft repair of the above MMS defect Indication:  Functional and aesthetic reconstruction of the above wound Donor site:  Left medial arm Donor site suture: 4-0 PDS     FTSG suture:  5-0 Polyprolene Graft length and width: 3.5 x 3.0 cm Graft surface area: 10.5 cm2 Postoperative medications: None Complications:  None  Due to the size, depth, and location, apposition could not be obtained without significant tension and distortion of adjacent tissue and structures. We discussed repair options including graft, flap, and second intention. We chose to proceed with a full thickness skin graft.  Therefore, a  template/measurement of the defect was made to pattern the excision in the donor site that was then aseptically prepped, anesthetized, and excised.  The donor defect was closed without significant tension.  Hemostasis was achieved with electrocautery.  The graft was then defatted, trimmed, and placed on the recipient site.  The circumference of the graft was secured with the above suture.  Internal bolstering stitches were placed as needed.  The surgical site was then lightly scrubbed with sterile, saline-soaked gauze. A bolster was stitched into place.  The area was then bandaged using Vaseline ointment, non-adherent gauze, gauze pads, and tape to provide an adequate pressure dressing.   The patient tolerated the procedure well, was given detailed written and verbal wound care instructions, and was discharged in good condition.  Sun protection and sun avoidance was also discussed. The patient will follow-up in 14 days.   Documentation: I have reviewed the above documentation for accuracy and completeness, and I agree with the above.  RUFUS CHRISTELLA HOLY, MD

## 2023-05-01 NOTE — Patient Instructions (Signed)

## 2023-05-02 ENCOUNTER — Encounter: Payer: Self-pay | Admitting: Dermatology

## 2023-05-02 ENCOUNTER — Other Ambulatory Visit (HOSPITAL_COMMUNITY): Payer: Self-pay

## 2023-05-02 ENCOUNTER — Other Ambulatory Visit: Payer: Self-pay

## 2023-05-03 ENCOUNTER — Telehealth: Payer: Self-pay

## 2023-05-03 ENCOUNTER — Encounter: Payer: Self-pay | Admitting: Nurse Practitioner

## 2023-05-03 ENCOUNTER — Ambulatory Visit: Payer: Medicare Other | Admitting: Nurse Practitioner

## 2023-05-03 VITALS — BP 128/78 | HR 61 | Wt 184.4 lb

## 2023-05-03 DIAGNOSIS — I152 Hypertension secondary to endocrine disorders: Secondary | ICD-10-CM | POA: Diagnosis not present

## 2023-05-03 DIAGNOSIS — R531 Weakness: Secondary | ICD-10-CM

## 2023-05-03 DIAGNOSIS — G4733 Obstructive sleep apnea (adult) (pediatric): Secondary | ICD-10-CM | POA: Diagnosis not present

## 2023-05-03 DIAGNOSIS — E1159 Type 2 diabetes mellitus with other circulatory complications: Secondary | ICD-10-CM | POA: Diagnosis not present

## 2023-05-03 DIAGNOSIS — E118 Type 2 diabetes mellitus with unspecified complications: Secondary | ICD-10-CM

## 2023-05-03 DIAGNOSIS — E1122 Type 2 diabetes mellitus with diabetic chronic kidney disease: Secondary | ICD-10-CM | POA: Diagnosis not present

## 2023-05-03 DIAGNOSIS — I739 Peripheral vascular disease, unspecified: Secondary | ICD-10-CM

## 2023-05-03 DIAGNOSIS — F419 Anxiety disorder, unspecified: Secondary | ICD-10-CM | POA: Diagnosis not present

## 2023-05-03 DIAGNOSIS — E1142 Type 2 diabetes mellitus with diabetic polyneuropathy: Secondary | ICD-10-CM | POA: Diagnosis not present

## 2023-05-03 DIAGNOSIS — E785 Hyperlipidemia, unspecified: Secondary | ICD-10-CM | POA: Diagnosis not present

## 2023-05-03 DIAGNOSIS — J449 Chronic obstructive pulmonary disease, unspecified: Secondary | ICD-10-CM

## 2023-05-03 DIAGNOSIS — L989 Disorder of the skin and subcutaneous tissue, unspecified: Secondary | ICD-10-CM

## 2023-05-03 DIAGNOSIS — E538 Deficiency of other specified B group vitamins: Secondary | ICD-10-CM

## 2023-05-03 DIAGNOSIS — I509 Heart failure, unspecified: Secondary | ICD-10-CM

## 2023-05-03 DIAGNOSIS — G4700 Insomnia, unspecified: Secondary | ICD-10-CM | POA: Diagnosis not present

## 2023-05-03 DIAGNOSIS — D0460 Carcinoma in situ of skin of unspecified upper limb, including shoulder: Secondary | ICD-10-CM

## 2023-05-03 DIAGNOSIS — K219 Gastro-esophageal reflux disease without esophagitis: Secondary | ICD-10-CM

## 2023-05-03 DIAGNOSIS — I25118 Atherosclerotic heart disease of native coronary artery with other forms of angina pectoris: Secondary | ICD-10-CM

## 2023-05-03 DIAGNOSIS — N1831 Chronic kidney disease, stage 3a: Secondary | ICD-10-CM

## 2023-05-03 DIAGNOSIS — E1169 Type 2 diabetes mellitus with other specified complication: Secondary | ICD-10-CM | POA: Diagnosis not present

## 2023-05-03 DIAGNOSIS — Z794 Long term (current) use of insulin: Secondary | ICD-10-CM | POA: Diagnosis not present

## 2023-05-03 IMAGING — DX DG CHEST 2V
2 series · 2 of 2 positions shown · non-contrast
Comparison: 01/29/2021 from [HOSPITAL]

CLINICAL DATA: Postop from CABG.

EXAM:
CHEST - 2 VIEW

[dg chest 2 view (1 of 2)]
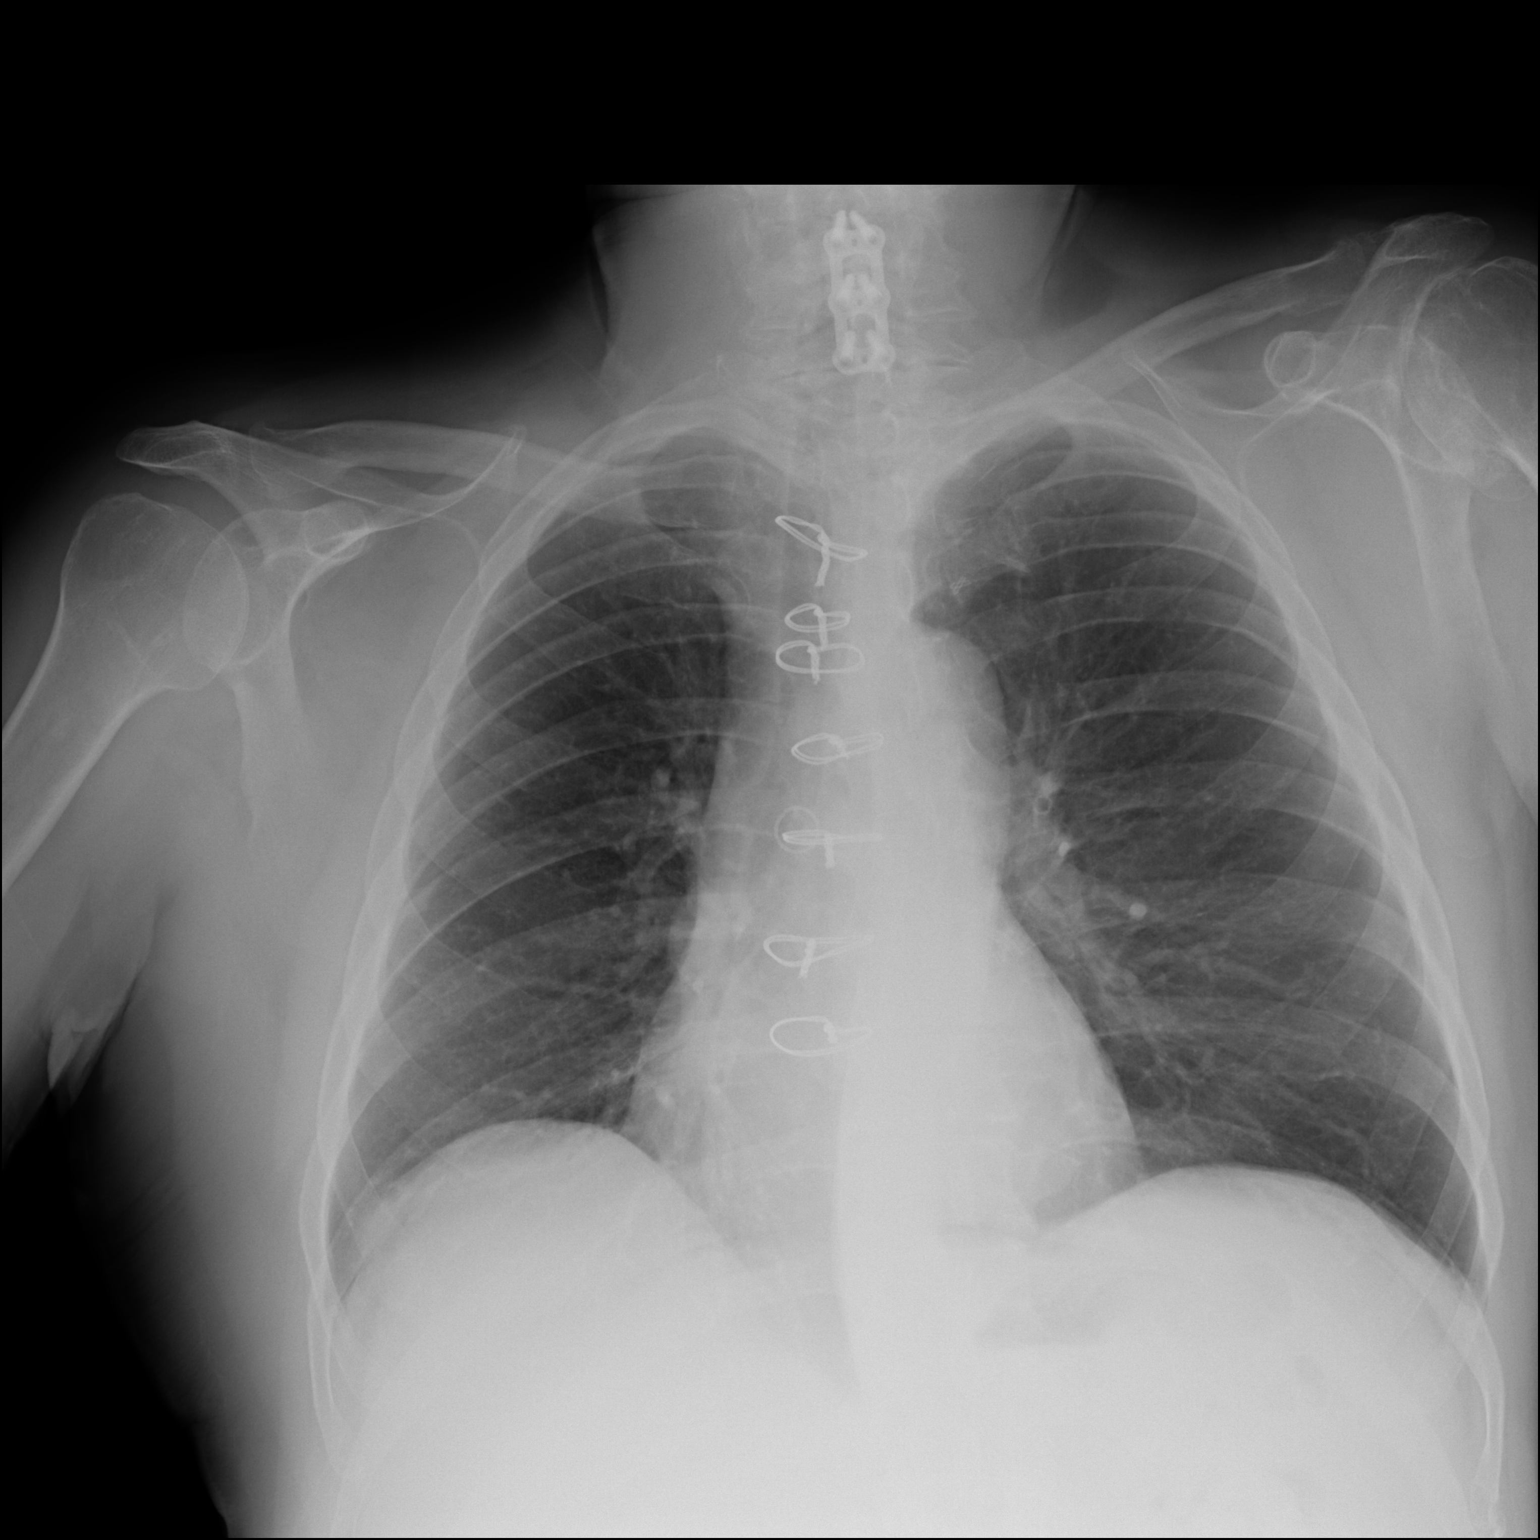

[dg chest 2 view (2 of 2)]
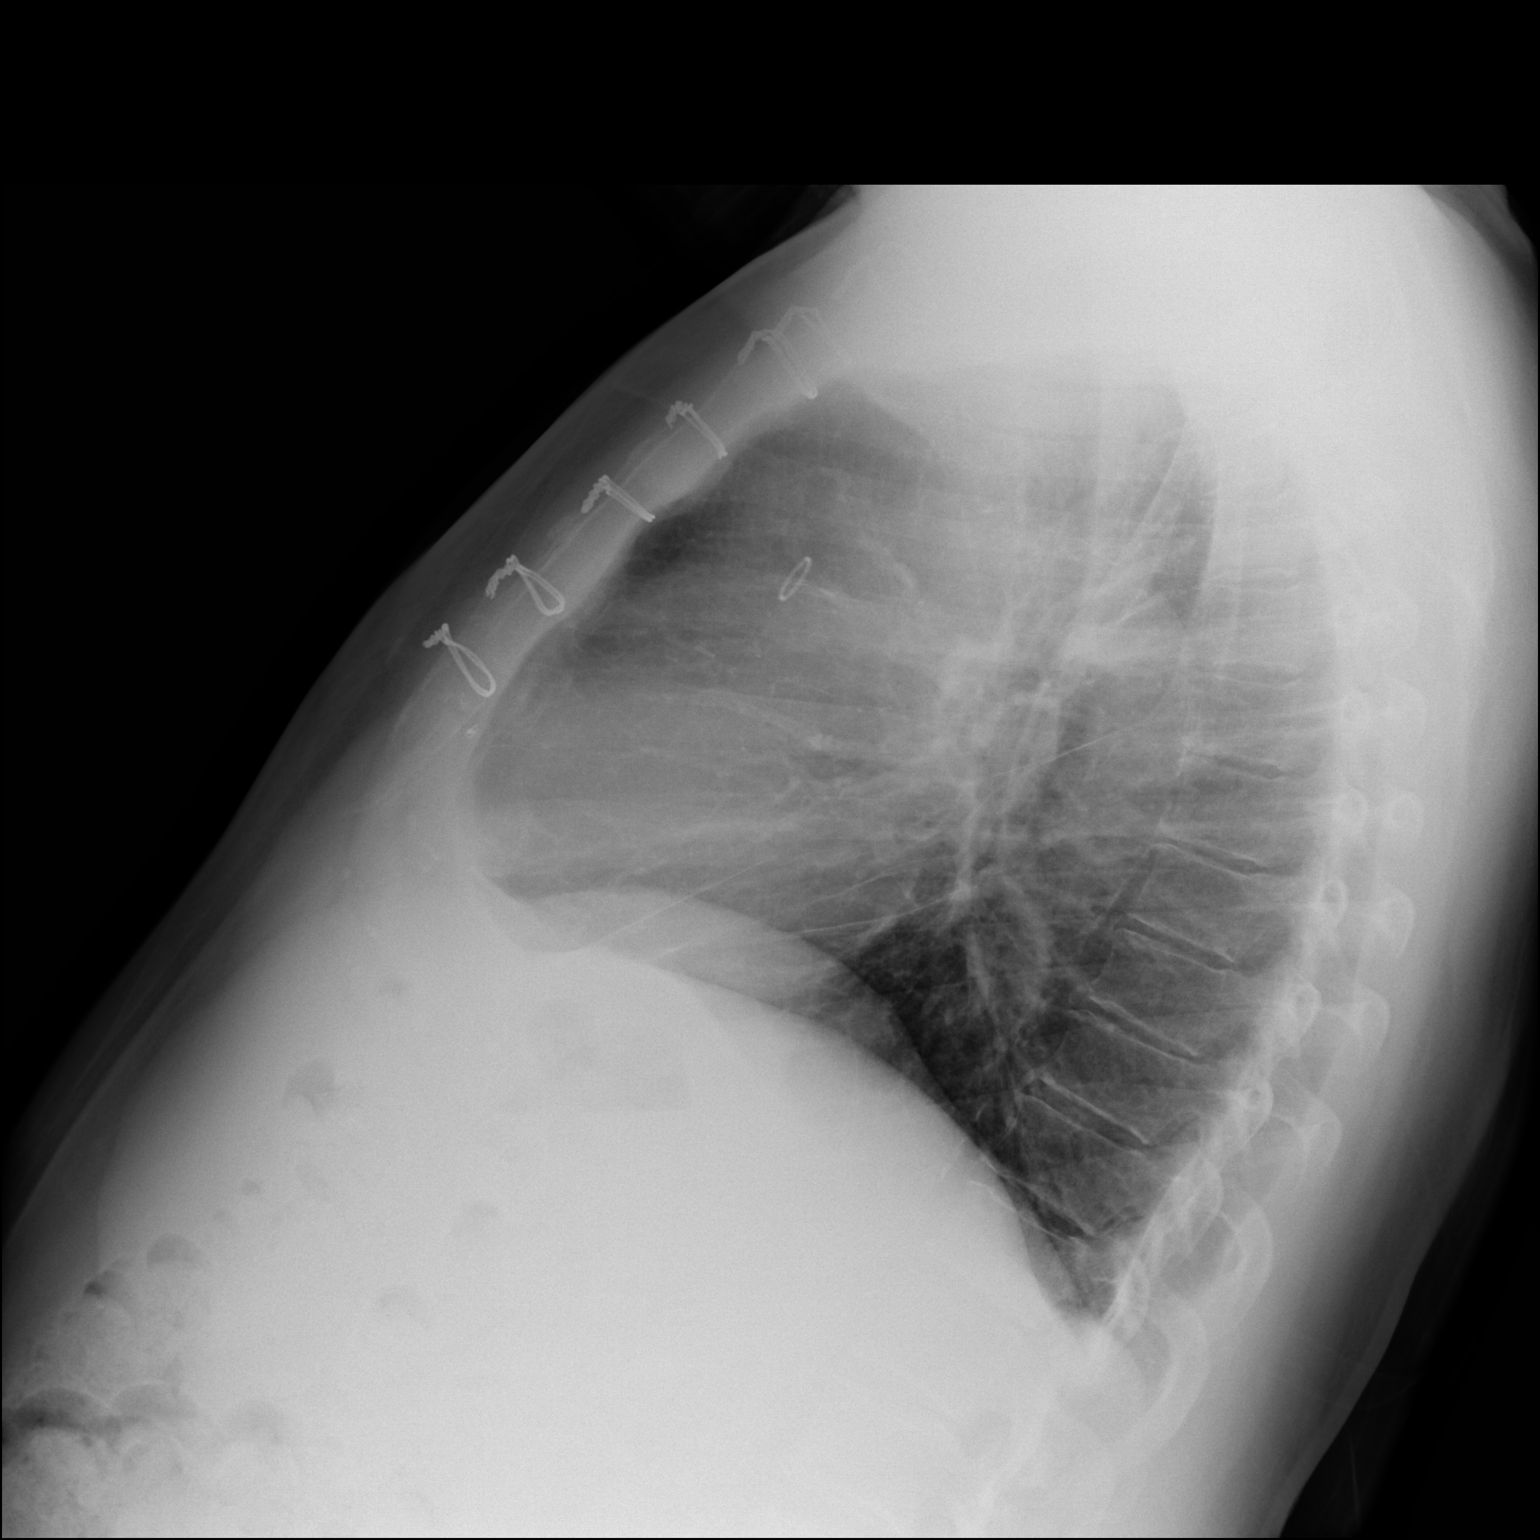

[2 of 2 positions shown; findings below may reference images not displayed]

FINDINGS: The heart size and mediastinal contours are within normal limits.
Patient has undergone CABG, and cervical spine fusion hardware is
also noted. Both lungs are clear. Previously seen bilateral pleural
effusions have now resolved.
IMPRESSION: No active cardiopulmonary disease.

## 2023-05-03 MED ORDER — PANTOPRAZOLE SODIUM 40 MG PO TBEC: 40.0000 mg | DELAYED_RELEASE_TABLET | Freq: Every day | ORAL | 1 refills | Status: DC

## 2023-05-03 MED ORDER — ASPIRIN 81 MG PO TBEC: 81.0000 mg | DELAYED_RELEASE_TABLET | Freq: Every day | ORAL | 3 refills | Status: DC

## 2023-05-03 MED ORDER — ANORO ELLIPTA 62.5-25 MCG/ACT IN AEPB: 1.0000 | INHALATION_SPRAY | Freq: Every day | RESPIRATORY_TRACT | 3 refills | Status: DC

## 2023-05-03 MED ORDER — INSULIN ASPART 100 UNIT/ML IJ SOLN: 0.0000 [IU] | Freq: Three times a day (TID) | INTRAMUSCULAR | 3 refills | Status: DC

## 2023-05-03 MED ORDER — ROSUVASTATIN CALCIUM 20 MG PO TABS: 20.0000 mg | ORAL_TABLET | Freq: Every day | ORAL | 3 refills | Status: DC

## 2023-05-03 MED ORDER — FREESTYLE LIBRE 3 PLUS SENSOR MISC: 3 refills | Status: DC

## 2023-05-03 MED ORDER — METFORMIN HCL ER 500 MG PO TB24: 1000.0000 mg | ORAL_TABLET | Freq: Two times a day (BID) | ORAL | 3 refills | Status: DC

## 2023-05-03 MED ORDER — ALBUTEROL SULFATE HFA 108 (90 BASE) MCG/ACT IN AERS: 2.0000 | INHALATION_SPRAY | RESPIRATORY_TRACT | 3 refills | Status: DC | PRN

## 2023-05-03 MED ORDER — CYANOCOBALAMIN 1000 MCG/ML IJ SOLN
1000.0000 ug | Freq: Once | INTRAMUSCULAR | Status: AC
  Administered 2023-05-03: 1000 ug via INTRAMUSCULAR

## 2023-05-03 MED ORDER — TOUJEO SOLOSTAR 300 UNIT/ML ~~LOC~~ SOPN
10.0000 [IU] | PEN_INJECTOR | Freq: Every day | SUBCUTANEOUS | 3 refills | Status: DC
Start: 2023-05-03 — End: 2023-11-06

## 2023-05-03 MED ORDER — GABAPENTIN 300 MG PO CAPS: 600.0000 mg | ORAL_CAPSULE | Freq: Three times a day (TID) | ORAL | 3 refills | Status: DC

## 2023-05-03 MED ORDER — ZOLPIDEM TARTRATE 10 MG PO TABS: 10.0000 mg | ORAL_TABLET | Freq: Every evening | ORAL | 2 refills | Status: DC | PRN

## 2023-05-03 MED ORDER — METOPROLOL TARTRATE 25 MG PO TABS: 25.0000 mg | ORAL_TABLET | Freq: Two times a day (BID) | ORAL | 3 refills | Status: DC

## 2023-05-03 NOTE — Telephone Encounter (Signed)
Spoke with pt who called to say hand was swollen and uncomfortable. I reminded him he could cut wrap at top and bottom 1/2 in to 1 in and if that didn't help, to change the brown wrap (cobain). I told him to continue elevating and pain meds.

## 2023-05-03 NOTE — Patient Instructions (Signed)
I have sent in refills of your insulin aspart (orange).   Thank you for your kindness!! You made my day.

## 2023-05-03 NOTE — Progress Notes (Signed)
[name], DNP, AGNP-c [facility] [address] [place], [place] [address] Main Office [phone]  ESTABLISHED PATIENT- Hospital Follow-Up Visit  Blood pressure 128/78, pulse 61, weight 184 lb 6.4 oz (83.6 kg), SpO2 94%.  HPI  Paul [name]  is a 73 y.o. year old male presenting today for evaluation and management following hospitalization.   Hollis tells me today: Revis presented with a recent episode of hypoglycemia that necessitated hospitalization. He reported that his blood sugar levels had plummeted to 50, which was attributed, by the hospital physician, to an excessive insulin dosage. He reports the physician told him that his insulin intake was not at an appropriate dose for him with three times a day dosing. Following hospitalization, the insulin dosage was adjusted, and the patient was switched to a different insulin pen that allowed for more precise dosage adjustments.  The patient also reported issues with his sleeping medication, stating that he was unable to make the medication last for the entire month despite taking the prescribed daily dose. He denied any possibility of someone else in his household taking the medication, although he does have another party living in the home. The patient expressed a strong preference for his current sleeping medication and had previously tried alternatives, such as trazodone, without success. We discussed that the amount prescribed could not be increased due to the nature of the medication.   The patient also reported feeling wobbly on his feet, necessitating the use of a cane. He was scheduled to restart physical therapy sessions, which had previously helped improve his mobility.   The patient also reported occasional shortness of breath and a history of smoking, although he had quit approximately 15 years prior.  The patient was proactive in managing his diabetes, adjusting his insulin dosage based on his blood sugar  levels. He reported that his morning blood sugar levels were typically around 135, which he considered satisfactory. The patient was also conscientious about his medication supply, ensuring he had enough insulin and discussing plans for unused medication.  ROS All ROS negative with exception of what is listed in HPI  PHYSICAL EXAM Physical Exam Vitals and nursing note reviewed.  Constitutional:      Appearance: Normal appearance.  HENT:     Head: Normocephalic.  Eyes:     Pupils: Pupils are equal, round, and reactive to light.  Cardiovascular:     Rate and Rhythm: Normal rate and regular rhythm.     Pulses: Normal pulses.     Heart sounds: Normal heart sounds.  Pulmonary:     Effort: Pulmonary effort is normal.     Breath sounds: Wheezing present.  Musculoskeletal:        General: Normal range of motion.     Cervical back: Normal range of motion.     Right lower leg: No edema.     Left lower leg: No edema.  Skin:    General: Skin is warm.  Neurological:     General: No focal deficit present.     Mental Status: He is alert and oriented to person, place, and time.     Motor: Weakness present.     Gait: Gait abnormal.  Psychiatric:        Mood and Affect: Mood normal.      ASSESSMENT & PLAN Problem List Items Addressed This Visit     CAD (coronary artery disease), native coronary artery (Chronic)   Relevant Medications   aspirin EC 81 MG tablet   rosuvastatin (CRESTOR)  20 MG tablet   metoprolol tartrate (LOPRESSOR) 25 MG tablet   Hyperlipidemia associated with type 2 diabetes mellitus (HCC)   Managed with rosuvastatin. No concerns. Continue current treatment.      Relevant Medications   insulin aspart (NOVOLOG) 100 UNIT/ML injection   aspirin EC 81 MG tablet   metFORMIN (GLUCOPHAGE-XR) 500 MG 24 hr tablet   insulin glargine, 1 Unit Dial, (TOUJEO SOLOSTAR) 300 UNIT/ML Solostar Pen   rosuvastatin (CRESTOR) 20 MG tablet   metoprolol tartrate (LOPRESSOR) 25 MG tablet    Other Relevant Orders   Hemoglobin A1c (Completed)   CBC with Differential/Platelet (Completed)   Comprehensive metabolic panel (Completed)   GERD (gastroesophageal reflux disease)   Stable on pantoprazole. Refills provided.       Relevant Medications   pantoprazole (PROTONIX) 40 MG tablet   DM (diabetes mellitus), type 2 with complications (HCC) - Primary   Experienced hypoglycemia with blood glucose levels dropping to 50 mg/dL due to insulin use. Hospital adjusted insulin regimen, reducing doses and switching to a sliding scale for better control from standard dosing with meals. Risks include confusion, seizures, and loss of consciousness. Benefits include better glucose control and reduced hypoglycemia risk. Prefers sliding scale for better management, which I agree is more appropriate. Ideally, I would like to attempt to transition him off of SS and onto GLP-1 for reduced risks of hypogylcemia and reduction of CV risks. We are working with pharmacy on management and will attempt to begin this transition. - Adjust insulin regimen to 10 units of Toujeo and sliding scale for insulin aspart (Novolog) - Send prescription for insulin aspart - Monitor blood glucose levels regularly - Educate on signs of hypoglycemia and appropriate insulin use - Continue to work with Marylene Land in pharmacy to help determine appropriate transition to alternative treatments including GLP1.      Relevant Medications   insulin aspart (NOVOLOG) 100 UNIT/ML injection   aspirin EC 81 MG tablet   metFORMIN (GLUCOPHAGE-XR) 500 MG 24 hr tablet   insulin glargine, 1 Unit Dial, (TOUJEO SOLOSTAR) 300 UNIT/ML Solostar Pen   rosuvastatin (CRESTOR) 20 MG tablet   Continuous Glucose Sensor (FREESTYLE LIBRE 3 PLUS SENSOR) MISC   Anxiety   Well controlled at this time with no alarm symptoms. Will monitor.      Chronic kidney disease, stage 3a (HCC)   Labs pending. In setting of diabetes, htn, CAD strict management  recommended to reduce risks of further damage. Ideally I would like to have him on Jardiance or Farxiga, but we are working to make slow changes to medication to best balance blood sugar and treat conditions. Will continue to work with pharmacist to help guide the transitions as best appropriate.       Relevant Medications   insulin aspart (NOVOLOG) 100 UNIT/ML injection   Other Relevant Orders   Hemoglobin A1c (Completed)   CBC with Differential/Platelet (Completed)   Comprehensive metabolic panel (Completed)   Hypertension associated with diabetes (HCC)   Well controlled in the setting of DM, CKD, CAD. Cotninue with current treatments and monitor for elevations greater than 140/85.      Relevant Medications   insulin aspart (NOVOLOG) 100 UNIT/ML injection   aspirin EC 81 MG tablet   metFORMIN (GLUCOPHAGE-XR) 500 MG 24 hr tablet   insulin glargine, 1 Unit Dial, (TOUJEO SOLOSTAR) 300 UNIT/ML Solostar Pen   rosuvastatin (CRESTOR) 20 MG tablet   metoprolol tartrate (LOPRESSOR) 25 MG tablet   Other Relevant Orders   Hemoglobin A1c (  Completed)   CBC with Differential/Platelet (Completed)   Comprehensive metabolic panel (Completed)   Peripheral vascular disease (HCC)   No edema present at this time. Chronic weakness and decreased sensation present, likely a result of underlying neuropathy and PVD. Start PT next week to aid in gait training and continue with cane usage.      Relevant Medications   aspirin EC 81 MG tablet   rosuvastatin (CRESTOR) 20 MG tablet   metoprolol tartrate (LOPRESSOR) 25 MG tablet   Diabetic polyneuropathy associated with type 2 diabetes mellitus (HCC)   Reports feeling wobbly on feet and requiring a cane. History of neuropathy associated with DM is likely a contributor. We discussed the importance of resuming physical therapy, which previously improved symptoms. Risks include falls and injury. Benefits include improved balance and strength. - Continue physical  therapy sessions starting next Tuesday and Thursday      Relevant Medications   insulin aspart (NOVOLOG) 100 UNIT/ML injection   aspirin EC 81 MG tablet   gabapentin (NEURONTIN) 300 MG capsule   metFORMIN (GLUCOPHAGE-XR) 500 MG 24 hr tablet   insulin glargine, 1 Unit Dial, (TOUJEO SOLOSTAR) 300 UNIT/ML Solostar Pen   rosuvastatin (CRESTOR) 20 MG tablet   Other Relevant Orders   Hemoglobin A1c (Completed)   CBC with Differential/Platelet (Completed)   Comprehensive metabolic panel (Completed)   Persistent insomnia   Reports difficulty sleeping without current sleep medication. Previous trials of trazodone and Xanax were ineffective or had adverse effects. Prefers to continue current medication as it is effective. Discussion on ways to ensure amount of medication is accurate when medication is received to reduce the risks of running out of medication Isauro Skelley.  - Consider use of pill packs to keep an accurate count of medication for the month.  - Utilize a monthly pill organizer to fill at the beginning of each month an ensure that medication is available. - Keep medication in a secure location away from where others can access to prevent the risk of others using. - Refill current sleep medication - Discuss potential alternative sleep aids if current medication becomes unavailable      Relevant Medications   insulin aspart (NOVOLOG) 100 UNIT/ML injection   Other Relevant Orders   Hemoglobin A1c (Completed)   CBC with Differential/Platelet (Completed)   Comprehensive metabolic panel (Completed)   Chronic heart failure (HCC)   No signs of fluid volume overload today. Shortness of breath ongoing related to COPD. Will monitor labs today. Starting PT next week, which will be good exercise and hopefully increase his stamina      Relevant Medications   aspirin EC 81 MG tablet   rosuvastatin (CRESTOR) 20 MG tablet   metoprolol tartrate (LOPRESSOR) 25 MG tablet   B12 deficiency   Labs  pending. Consider injections if levels remain low.      Squamous cell carcinoma in situ (SCCIS) of dorsum of hand   Had a growth removed from hand, diagnosed as skin cancer. Growth excised by a Engineer, petroleum. Risks include recurrence and metastasis. Benefits include removal of cancerous tissue. - Monitor for any new skin lesions - Follow up with dermatologist as needed      Generalized weakness   Reports feeling wobbly on feet and requiring a cane. History of neuropathy associated with DM is likely a contributor. We discussed the importance of resuming physical therapy, which previously improved symptoms. Risks include falls and injury. Benefits include improved balance and strength. - Continue physical therapy sessions starting next Tuesday  and Thursday      Relevant Medications   insulin aspart (NOVOLOG) 100 UNIT/ML injection   Other Relevant Orders   Hemoglobin A1c (Completed)   CBC with Differential/Platelet (Completed)   Comprehensive metabolic panel (Completed)   COPD (chronic obstructive pulmonary disease) (HCC)   Chronic shortness of breath at rest and with exertion with reduced stamina. He is currently on Anoro for daily management. Triple therapy may be an appropriate change if his symptoms continue. Recommend discussion with pulmonology on changes that may be appropriate. He may benefit from PFT's in the near future.       Relevant Medications   umeclidinium-vilanterol (ANORO ELLIPTA) 62.5-25 MCG/ACT AEPB   albuterol (VENTOLIN HFA) 108 (90 Base) MCG/ACT inhaler   OSA and COPD overlap syndrome (HCC)   Relevant Medications   insulin aspart (NOVOLOG) 100 UNIT/ML injection   umeclidinium-vilanterol (ANORO ELLIPTA) 62.5-25 MCG/ACT AEPB   albuterol (VENTOLIN HFA) 108 (90 Base) MCG/ACT inhaler   Other Relevant Orders   Hemoglobin A1c (Completed)   CBC with Differential/Platelet (Completed)   Comprehensive metabolic panel (Completed)    FOLLOW-UP  Time: 45 minutes, >50%  spent counseling, care coordination, chart review, and documentation.   [name], DNP, AGNP-c

## 2023-05-04 ENCOUNTER — Other Ambulatory Visit: Payer: Self-pay | Admitting: Nurse Practitioner

## 2023-05-04 ENCOUNTER — Other Ambulatory Visit: Payer: Self-pay

## 2023-05-04 ENCOUNTER — Other Ambulatory Visit (HOSPITAL_COMMUNITY): Payer: Self-pay

## 2023-05-04 ENCOUNTER — Telehealth: Payer: Self-pay | Admitting: Nurse Practitioner

## 2023-05-04 DIAGNOSIS — F5104 Psychophysiologic insomnia: Secondary | ICD-10-CM

## 2023-05-04 DIAGNOSIS — G4700 Insomnia, unspecified: Secondary | ICD-10-CM

## 2023-05-04 LAB — COMPREHENSIVE METABOLIC PANEL
ALT: 15 [IU]/L (ref 0–44)
AST: 35 [IU]/L (ref 0–40)
Albumin: 4 g/dL (ref 3.8–4.8)
Alkaline Phosphatase: 111 [IU]/L (ref 44–121)
BUN/Creatinine Ratio: 14 (ref 10–24)
BUN: 16 mg/dL (ref 8–27)
Bilirubin Total: 0.5 mg/dL (ref 0.0–1.2)
CO2: 23 mmol/L (ref 20–29)
Calcium: 9.2 mg/dL (ref 8.6–10.2)
Chloride: 102 mmol/L (ref 96–106)
Creatinine, Ser: 1.16 mg/dL (ref 0.76–1.27)
Globulin, Total: 2.6 g/dL (ref 1.5–4.5)
Glucose: 178 mg/dL — ABNORMAL HIGH (ref 70–99)
Potassium: 5.1 mmol/L (ref 3.5–5.2)
Sodium: 137 mmol/L (ref 134–144)
Total Protein: 6.6 g/dL (ref 6.0–8.5)
eGFR: 66 mL/min/{1.73_m2} (ref >59)

## 2023-05-04 LAB — CBC WITH DIFFERENTIAL/PLATELET
Basophils Absolute: 0.1 10*3/uL (ref 0.0–0.2)
Basos: 1 %
EOS (ABSOLUTE): 0.3 10*3/uL (ref 0.0–0.4)
Eos: 4 %
Hematocrit: 42.1 % (ref 37.5–51.0)
Hemoglobin: 14 g/dL (ref 13.0–17.7)
Immature Grans (Abs): 0 10*3/uL (ref 0.0–0.1)
Immature Granulocytes: 0 %
Lymphocytes Absolute: 1.9 10*3/uL (ref 0.7–3.1)
Lymphs: 28 %
MCH: 31.2 pg (ref 26.6–33.0)
MCHC: 33.3 g/dL (ref 31.5–35.7)
MCV: 94 fL (ref 79–97)
Monocytes Absolute: 0.4 10*3/uL (ref 0.1–0.9)
Monocytes: 6 %
Neutrophils Absolute: 4 10*3/uL (ref 1.4–7.0)
Neutrophils: 61 %
Platelets: 141 10*3/uL — ABNORMAL LOW (ref 150–450)
RBC: 4.49 x10E6/uL (ref 4.14–5.80)
RDW: 12.2 % (ref 11.6–15.4)
WBC: 6.6 10*3/uL (ref 3.4–10.8)

## 2023-05-04 LAB — HEMOGLOBIN A1C
Est. average glucose Bld gHb Est-mCnc: 203 mg/dL
Hgb A1c MFr Bld: 8.7 % — ABNORMAL HIGH (ref 4.8–5.6)

## 2023-05-04 MED ORDER — ZOLPIDEM TARTRATE 10 MG PO TABS: 10.0000 mg | ORAL_TABLET | Freq: Every evening | ORAL | 2 refills | Status: DC | PRN

## 2023-05-04 NOTE — Telephone Encounter (Signed)
Pt wants Ambien sent to local pharmacy  [facility]

## 2023-05-08 ENCOUNTER — Other Ambulatory Visit: Payer: Self-pay | Admitting: Nurse Practitioner

## 2023-05-08 ENCOUNTER — Other Ambulatory Visit (HOSPITAL_COMMUNITY): Payer: Self-pay

## 2023-05-08 DIAGNOSIS — F5104 Psychophysiologic insomnia: Secondary | ICD-10-CM

## 2023-05-08 MED ORDER — ZOLPIDEM TARTRATE 10 MG PO TABS
10.0000 mg | ORAL_TABLET | Freq: Every evening | ORAL | 2 refills | Status: DC | PRN
  Filled 2023-05-08 (×2): qty 30, 30d supply, fill #0
  Filled 2023-06-08 – 2023-06-12 (×4): qty 30, 30d supply, fill #1

## 2023-05-08 NOTE — Telephone Encounter (Signed)
Needs to be changed to [facility] pharmacy

## 2023-05-10 ENCOUNTER — Ambulatory Visit: Payer: Medicare Other

## 2023-05-10 ENCOUNTER — Other Ambulatory Visit (HOSPITAL_COMMUNITY): Payer: Self-pay

## 2023-05-10 ENCOUNTER — Telehealth: Payer: Self-pay

## 2023-05-10 DIAGNOSIS — N1831 Chronic kidney disease, stage 3a: Secondary | ICD-10-CM

## 2023-05-10 DIAGNOSIS — E1122 Type 2 diabetes mellitus with diabetic chronic kidney disease: Secondary | ICD-10-CM

## 2023-05-10 DIAGNOSIS — Z794 Long term (current) use of insulin: Secondary | ICD-10-CM

## 2023-05-10 MED ORDER — DOXYCYCLINE HYCLATE 100 MG PO TABS
100.0000 mg | ORAL_TABLET | Freq: Two times a day (BID) | ORAL | 0 refills | Status: DC
  Filled 2023-05-10: qty 14, 7d supply, fill #0

## 2023-05-10 NOTE — Progress Notes (Signed)
   05/10/2023 Name: Paul [name] MRN: [id number] DOB: 19-Dec-1950  Chief Complaint  Patient presents with   Diabetes   Medication Management    [name] is a 73 y.o. year old male who presented for a telephone visit.   They were referred to the pharmacist by their PCP for assistance in managing complex medication management.    Subjective:  Care Team: Primary Care Provider: Tollie Eth, NP ; Next Scheduled Visit: 11/06/23  Medication Access/Adherence  Current Pharmacy:  [facility] [address] [place] [address] Phone: [phone] Fax: [phone]  [facility] - [place], [place] - [address] [place] [address] Phone: [phone] Fax: [phone]   Patient reports affordability concerns with their medications: No  Patient reports access/transportation concerns to their pharmacy: No  Patient reports adherence concerns with their medications:  No     Diabetes:  Current medications: Novolog ss, Toujeo 10 units, Metformin XR 1000mg  BID Medications tried in the past: Glipizide  Current glucose readings:    Patient denies hypoglycemic s/sx including dizziness, shakiness, sweating. Patient denies hyperglycemic symptoms including polyuria, polydipsia, polyphagia, nocturia, neuropathy, blurred vision    Objective:  Lab Results  Component Value Date   HGBA1C 8.7 (H) 05/03/2023    Lab Results  Component Value Date   CREATININE 1.16 05/03/2023   BUN 16 05/03/2023   NA 137 05/03/2023   K 5.1 05/03/2023   CL 102 05/03/2023   CO2 23 05/03/2023    Lab Results  Component Value Date   CHOL 76 (L) 01/11/2023   HDL 23 (L) 01/11/2023   LDLCALC 15 01/11/2023   TRIG 255 (H) 01/11/2023   CHOLHDL 3.3 01/11/2023    Medications Reviewed Today   Medications were not reviewed in this encounter       Assessment/Plan:   Diabetes: - Currently uncontrolled - Reviewed  long term cardiovascular and renal outcomes of uncontrolled blood sugar - Reviewed goal A1c, goal fasting, and goal 2 hour post prandial glucose - Reviewed dietary modifications including low carb diet: -Consulted with PCP. START Ozempic 0.25mg  once weekly x 4 weeks, then increase to 0.5mg  weekly if tolerating well. 1 box provided in office today and instructed on use, potential side effects. Continue all other medication therapies. -Submitted PAP for Ozempic assistance through [facility], pending decision. -Future consideration: Maximize Ozempic and dial back Novolog as sugars begin to be controlled. Also would like to start SLGT2 in next 2-3 months if able to further decrease insulin burden and provide kidney protection.    Follow Up Plan: 4 weeks  Paul [name], PharmD Clinical Pharmacist [phone]

## 2023-05-10 NOTE — Telephone Encounter (Signed)
Patient called to say his hand is very swollen and painful after his surgery. He took the wrap off because it was cutting his hand. He does not have MyChart so he texted a picture to my cell phone and Dr Caralyn Guile reviewed it. She said it does not look infected but does look inflamed. Sent in Doxycycline 100 mg 1 twice daily x 7 days to [facility]. Advised patient if he has any trouble over the weekend, he should go to Urgent Care or the ER; otherwise he should keep his appointment with Dr Caralyn Guile on 05/15/2023 at 1:30 pm/hd

## 2023-05-11 ENCOUNTER — Other Ambulatory Visit (HOSPITAL_COMMUNITY): Payer: Self-pay

## 2023-05-13 ENCOUNTER — Encounter: Payer: Self-pay | Admitting: Nurse Practitioner

## 2023-05-13 NOTE — Assessment & Plan Note (Signed)
Labs pending. Consider injections if levels remain low.

## 2023-05-13 NOTE — Assessment & Plan Note (Signed)
Well controlled at this time with no alarm symptoms. Will monitor.

## 2023-05-13 NOTE — Assessment & Plan Note (Signed)
Reports feeling wobbly on feet and requiring a cane. History of neuropathy associated with DM is likely a contributor. We discussed the importance of resuming physical therapy, which previously improved symptoms. Risks include falls and injury. Benefits include improved balance and strength. - Continue physical therapy sessions starting next Tuesday and Thursday

## 2023-05-13 NOTE — Assessment & Plan Note (Signed)
Experienced hypoglycemia with blood glucose levels dropping to 50 mg/dL due to insulin use. Hospital adjusted insulin regimen, reducing doses and switching to a sliding scale for better control from standard dosing with meals. Risks include confusion, seizures, and loss of consciousness. Benefits include better glucose control and reduced hypoglycemia risk. Prefers sliding scale for better management, which I agree is more appropriate. Ideally, I would like to attempt to transition him off of SS and onto GLP-1 for reduced risks of hypogylcemia and reduction of CV risks. We are working with pharmacy on management and will attempt to begin this transition. - Adjust insulin regimen to 10 units of Toujeo and sliding scale for insulin aspart (Novolog) - Send prescription for insulin aspart - Monitor blood glucose levels regularly - Educate on signs of hypoglycemia and appropriate insulin use - Continue to work with Marylene Land in pharmacy to help determine appropriate transition to alternative treatments including GLP1.

## 2023-05-13 NOTE — Assessment & Plan Note (Signed)
Labs pending. In setting of diabetes, htn, CAD strict management recommended to reduce risks of further damage. Ideally I would like to have him on Jardiance or Farxiga, but we are working to make slow changes to medication to best balance blood sugar and treat conditions. Will continue to work with pharmacist to help guide the transitions as best appropriate.

## 2023-05-13 NOTE — Assessment & Plan Note (Signed)
Stable on pantoprazole. Refills provided.

## 2023-05-13 NOTE — Assessment & Plan Note (Signed)
Had a growth removed from hand, diagnosed as skin cancer. Growth excised by a Engineer, petroleum. Risks include recurrence and metastasis. Benefits include removal of cancerous tissue. - Monitor for any new skin lesions - Follow up with dermatologist as needed

## 2023-05-13 NOTE — Assessment & Plan Note (Signed)
Managed with rosuvastatin. No concerns. Continue current treatment.

## 2023-05-13 NOTE — Assessment & Plan Note (Signed)
Reports difficulty sleeping without current sleep medication. Previous trials of trazodone and Xanax were ineffective or had adverse effects. Prefers to continue current medication as it is effective. Discussion on ways to ensure amount of medication is accurate when medication is received to reduce the risks of running out of medication Maxim Bedel.  - Consider use of pill packs to keep an accurate count of medication for the month.  - Utilize a monthly pill organizer to fill at the beginning of each month an ensure that medication is available. - Keep medication in a secure location away from where others can access to prevent the risk of others using. - Refill current sleep medication - Discuss potential alternative sleep aids if current medication becomes unavailable

## 2023-05-13 NOTE — Assessment & Plan Note (Signed)
Well controlled in the setting of DM, CKD, CAD. Cotninue with current treatments and monitor for elevations greater than 140/85.

## 2023-05-13 NOTE — Assessment & Plan Note (Signed)
No signs of fluid volume overload today. Shortness of breath ongoing related to COPD. Will monitor labs today. Starting PT next week, which will be good exercise and hopefully increase his stamina

## 2023-05-13 NOTE — Assessment & Plan Note (Signed)
Chronic shortness of breath at rest and with exertion with reduced stamina. He is currently on Anoro for daily management. Triple therapy may be an appropriate change if his symptoms continue. Recommend discussion with pulmonology on changes that may be appropriate. He may benefit from PFT's in the near future.

## 2023-05-13 NOTE — Assessment & Plan Note (Signed)
No edema present at this time. Chronic weakness and decreased sensation present, likely a result of underlying neuropathy and PVD. Start PT next week to aid in gait training and continue with cane usage.

## 2023-05-14 ENCOUNTER — Encounter: Payer: Medicare Other | Admitting: Nurse Practitioner

## 2023-05-15 ENCOUNTER — Ambulatory Visit (INDEPENDENT_AMBULATORY_CARE_PROVIDER_SITE_OTHER): Payer: Medicare Other | Admitting: Dermatology

## 2023-05-15 ENCOUNTER — Encounter: Payer: Self-pay | Admitting: Dermatology

## 2023-05-15 VITALS — BP 162/76 | HR 84

## 2023-05-15 DIAGNOSIS — Z48817 Encounter for surgical aftercare following surgery on the skin and subcutaneous tissue: Secondary | ICD-10-CM

## 2023-05-15 DIAGNOSIS — C4492 Squamous cell carcinoma of skin, unspecified: Secondary | ICD-10-CM

## 2023-05-15 DIAGNOSIS — T1490XD Injury, unspecified, subsequent encounter: Secondary | ICD-10-CM

## 2023-05-15 DIAGNOSIS — Z85828 Personal history of other malignant neoplasm of skin: Secondary | ICD-10-CM

## 2023-05-15 NOTE — Patient Instructions (Signed)
Important Information   Due to recent changes in healthcare laws, you may see results of your pathology and/or laboratory studies on MyChart before the doctors have had a chance to review them. We understand that in some cases there may be results that are confusing or concerning to you. Please understand that not all results are received at the same time and often the doctors may need to interpret multiple results in order to provide you with the best plan of care or course of treatment. Therefore, we ask that you please give [place] 2 business days to thoroughly review all your results before contacting the office for clarification. Should we see a critical lab result, you will be contacted sooner.     If You Need Anything After Your Visit   If you have any questions or concerns for your doctor, please call our main line at [phone]. If no one answers, please leave a voicemail as directed and we will return your call as soon as possible. Messages left after 4 pm will be answered the following business day.    You may also send [place] a message via MyChart. We typically respond to MyChart messages within 1-2 business days.  For prescription refills, please ask your pharmacy to contact our office. Our fax number is [phone].  If you have an urgent issue when the clinic is closed that cannot wait until the next business day, you can page your doctor at the number below.     Please note that while we do our best to be available for urgent issues outside of office hours, we are not available 24/7.    If you have an urgent issue and are unable to reach [place], you may choose to seek medical care at your doctor's office, retail clinic, urgent care center, or emergency room.   If you have a medical emergency, please immediately call 911 or go to the emergency department. In the event of inclement weather, please call our main line at [phone] for an update on the status of any delays or  closures.  Dermatology Medication Tips: Please keep the boxes that topical medications come in in order to help keep track of the instructions about where and how to use these. Pharmacies typically print the medication instructions only on the boxes and not directly on the medication tubes.   If your medication is too expensive, please contact our office at [phone] or send [place] a message through MyChart.    We are unable to tell what your co-pay for medications will be in advance as this is different depending on your insurance coverage. However, we may be able to find a substitute medication at lower cost or fill out paperwork to get insurance to cover a needed medication.    If a prior authorization is required to get your medication covered by your insurance company, please allow [place] 1-2 business days to complete this process.   Drug prices often vary depending on where the prescription is filled and some pharmacies may offer cheaper prices.   The website www.goodrx.com contains coupons for medications through different pharmacies. The prices here do not account for what the cost may be with help from insurance (it may be cheaper with your insurance), but the website can give you the price if you did not use any insurance.  - You can print the associated coupon and take it with your prescription to the pharmacy.  - You may also stop by our office during regular  business hours and pick up a GoodRx coupon card.  - If you need your prescription sent electronically to a different pharmacy, notify our office through [facility] or by phone at [phone]

## 2023-05-15 NOTE — Progress Notes (Signed)
   Follow Up Visit   Subjective  Paul [name] is a 73 y.o. male who presents for the following: follow up from Mohs surgery   The patient presents for follow up from Mohs surgery for an SCC on the left hand, treated on 05/01/23, repaired with FTSG. The patient has been bandaging the wound as directed. The endorse the following concerns: remove stitches. He did endorse some pain of the site.  The following portions of the chart were reviewed this encounter and updated as appropriate: medications, allergies, medical history  Review of Systems:  No other skin or systemic complaints except as noted in HPI or Assessment and Plan.  Objective  Well appearing patient in no apparent distress; mood and affect are within normal limits.  A full examination was performed including scalp, head, face and right hand All findings within normal limits unless otherwise noted below.  Healing wound with mild erythema  Relevant physical exam findings are noted in the Assessment and Plan.    Assessment & Plan   Healing s/p Mohs for [facility], treated on 05/01/23, repaired with FTSG - Reassured that wound is healing well - He is finishing a course of doxycycline - Sutures removed today - No evidence of infection - No swelling, induration, purulence, dehiscence, or tenderness out of proportion to the clinical exam, see photo above - Discussed that scars take up to 12 months to mature from the date of surgery - Recommend SPF 30+ to scar daily to prevent purple color from UV exposure during scar maturation process - Discussed that erythema and raised appearance of scar will fade over the next 4-6 months - OK to start scar massage at 4-6 weeks post-op - Can consider silicone based products for scar healing starting at 6 weeks post-op - Ok to continue ointment daily to wound under a bandage for another 10 days    Return in about 2 weeks (around 05/29/2023) for wound check.  Dominga Ferry, Surg Tech III, am acting  as scribe for Gwenith Daily, MD.   Documentation: I have reviewed the above documentation for accuracy and completeness, and I agree with the above.  Gwenith Daily, MD

## 2023-05-16 ENCOUNTER — Other Ambulatory Visit: Payer: Self-pay | Admitting: Nurse Practitioner

## 2023-05-17 ENCOUNTER — Other Ambulatory Visit (HOSPITAL_COMMUNITY): Payer: Self-pay

## 2023-05-17 ENCOUNTER — Telehealth: Payer: Self-pay

## 2023-05-17 MED ORDER — ONDANSETRON HCL 4 MG PO TABS
4.0000 mg | ORAL_TABLET | Freq: Three times a day (TID) | ORAL | 2 refills | Status: DC | PRN
  Filled 2023-05-17: qty 20, 7d supply, fill #0
  Filled 2023-05-29: qty 20, 7d supply, fill #1

## 2023-05-17 NOTE — Progress Notes (Signed)
   05/17/2023  Patient ID: Paul [name], male   DOB: 17-Oct-1950, 73 y.o.   MRN: [id number]  Patient called in reporting some nausea that has started since beginning his Ozempic injections. Spoke with PCP and will send in an rx for Zofran for patient to use as needed.  Sherrill Raring, PharmD Clinical Pharmacist [phone]

## 2023-05-21 ENCOUNTER — Telehealth: Payer: Self-pay

## 2023-05-21 NOTE — Progress Notes (Signed)
Pharmacy Medication Assistance Program Note    05/21/2023  Patient ID: Paul [name], male   DOB: 11-Feb-1951, 73 y.o.   MRN: [id number]     05/21/2023  Outreach Medication One  Manufacturer Medication One [employer] Ozempic  Dose of Ozempic 0.5MG   Type of Sport and exercise psychologist  Patient Assistance Determination Approved  Approval Start Date 05/13/2023  Approval End Date 05/12/2024     Signature

## 2023-05-21 NOTE — Telephone Encounter (Signed)
PAP: Patient assistance application for Ozempic has been approved by PAP Companies: [employer] from 05/13/2023 to 05/12/2024. Medication should be delivered to PAP Delivery: Provider's office. For further shipping updates, please [employer] at [phone]. Patient ID is: [id number]

## 2023-05-23 ENCOUNTER — Telehealth: Payer: Self-pay

## 2023-05-23 NOTE — Progress Notes (Signed)
   05/23/2023  Patient ID: Paul [name], male   DOB: [birth year], 73 y.o.   MRN: [id number]  Patient received notice in the mail from his insurance company that Anoro would not be covered by his insurance going forward.  Spoke with PCP and okay to switch to Breztri . [employer] has a PAP that patient should qualify for. Coordinated with patient and he will come by office 05/24/23 to sign the paperwork.  Jon VEAR Lindau, PharmD Clinical Pharmacist [phone]

## 2023-05-24 ENCOUNTER — Telehealth: Payer: Self-pay | Admitting: Nurse Practitioner

## 2023-05-24 NOTE — Telephone Encounter (Signed)
 PAP ozempic received, called pt & informed

## 2023-05-25 ENCOUNTER — Other Ambulatory Visit: Payer: Self-pay

## 2023-05-29 ENCOUNTER — Other Ambulatory Visit (HOSPITAL_COMMUNITY): Payer: Self-pay

## 2023-05-29 ENCOUNTER — Ambulatory Visit (INDEPENDENT_AMBULATORY_CARE_PROVIDER_SITE_OTHER): Payer: Medicare Other | Admitting: Dermatology

## 2023-05-29 ENCOUNTER — Encounter: Payer: Self-pay | Admitting: Dermatology

## 2023-05-29 DIAGNOSIS — Z85828 Personal history of other malignant neoplasm of skin: Secondary | ICD-10-CM

## 2023-05-29 DIAGNOSIS — L905 Scar conditions and fibrosis of skin: Secondary | ICD-10-CM

## 2023-05-29 DIAGNOSIS — M25541 Pain in joints of right hand: Secondary | ICD-10-CM

## 2023-05-29 DIAGNOSIS — M25542 Pain in joints of left hand: Secondary | ICD-10-CM

## 2023-05-29 DIAGNOSIS — Z48817 Encounter for surgical aftercare following surgery on the skin and subcutaneous tissue: Secondary | ICD-10-CM

## 2023-05-29 DIAGNOSIS — C4492 Squamous cell carcinoma of skin, unspecified: Secondary | ICD-10-CM

## 2023-05-29 NOTE — Patient Instructions (Signed)

## 2023-05-29 NOTE — Progress Notes (Signed)
   Follow Up Visit   Subjective  Paul [name] is a 73 y.o. male who presents for the following: follow up from Mohs surgery  on the left dorsal hand.  The patient presents for follow up from Mohs surgery for a SCC on the right hand, treated on 05/01/23, repaired with FTSG. The patient has been bandaging the wound as directed. The endorse the following concerns: pain has been having pain particularly on knuckle.  The following portions of the chart were reviewed this encounter and updated as appropriate: medications, allergies, medical history  Review of Systems:  No other skin or systemic complaints except as noted in HPI or Assessment and Plan.  Objective  Well appearing patient in no apparent distress; mood and affect are within normal limits.  A full examination was performed including scalp, head, face and right hand. All findings within normal limits unless otherwise noted below.  Healing wound with mild erythema  Relevant physical exam findings are noted in the Assessment and Plan.    Assessment & Plan    Healing s/p Mohs for SCC on right dorsal hand, treated on 05/01/23, repaired with FTSG - Reassured that wound is healing well - No evidence of infection - No swelling, induration, purulence, dehiscence, or tenderness out of proportion to the clinical exam, see photo above - Discussed that scars take up to 12 months to mature from the date of surgery - Recommend SPF 30+ to scar daily to prevent purple color from UV exposure during scar maturation process - Discussed that erythema and raised appearance of scar will fade over the next 4-6 months - OK to start scar massage at 4-6 weeks post-op - Can consider silicone based products for scar healing starting at 6 weeks post-op - Ok to continue ointment daily to wound under a bandage for another few weeks  Pain of Adjacent R 1st MCP Joint - Mild erythema and swelling - Discussed that it appears to have improved since last visit -  Continue to monitor - Likely related to healing process of surgery  Return in about 4 weeks (around 06/26/2023) for wound check.  I, Tillie Fantasia, CMA, am acting as scribe for Gwenith Daily, MD.   Documentation: I have reviewed the above documentation for accuracy and completeness, and I agree with the above.  Gwenith Daily, MD

## 2023-05-31 ENCOUNTER — Other Ambulatory Visit: Payer: Medicare Other

## 2023-06-01 ENCOUNTER — Other Ambulatory Visit (HOSPITAL_COMMUNITY): Payer: Self-pay

## 2023-06-06 ENCOUNTER — Other Ambulatory Visit: Payer: Self-pay | Admitting: Nurse Practitioner

## 2023-06-06 DIAGNOSIS — N1831 Chronic kidney disease, stage 3a: Secondary | ICD-10-CM

## 2023-06-06 NOTE — Telephone Encounter (Signed)
Copied from CRM [id number]. Topic: Clinical - Medication Refill >> Jun 06, 2023  8:52 AM Gery Pray wrote: Most Recent Primary Care Visit:  Provider: HERRING, ANGELA H  Department: [facility]  Visit Type: FOLLOW UP 30  Date: 05/10/2023  Medication: Continuous Glucose Sensor (FREESTYLE LIBRE 3 PLUS SENSOR) MISC  Has the patient contacted their pharmacy? No (Agent: If no, request that the patient contact the pharmacy for the refill. If patient does not wish to contact the pharmacy document the reason why and proceed with request.) (Agent: If yes, when and what did the pharmacy advise?) Patient knows to call the provider regarding the sensor  Is this the correct pharmacy for this prescription? Yes If no, delete pharmacy and type the correct one.  This is the patient's preferred pharmacy:  [place] [facility] [address] [place] [address] Phone: [phone] Fax: [phone]  [facility] - [place], [place] - [address] [place] [address] Phone: [phone] Fax: [phone]   Has the prescription been filled recently? No  Is the patient out of the medication? No. Just put the last sensor on last week on Tuesday  Has the patient been seen for an appointment in the last year OR does the patient have an upcoming appointment? Yes  Can we respond through MyChart? No. Please call patient [phone]  Agent: Please be advised that Rx refills may take up to 3 business days. We ask that you follow-up with your pharmacy.

## 2023-06-07 ENCOUNTER — Other Ambulatory Visit (INDEPENDENT_AMBULATORY_CARE_PROVIDER_SITE_OTHER): Payer: Medicare Other

## 2023-06-07 DIAGNOSIS — N1831 Chronic kidney disease, stage 3a: Secondary | ICD-10-CM

## 2023-06-07 DIAGNOSIS — Z794 Long term (current) use of insulin: Secondary | ICD-10-CM

## 2023-06-07 DIAGNOSIS — E1122 Type 2 diabetes mellitus with diabetic chronic kidney disease: Secondary | ICD-10-CM

## 2023-06-07 NOTE — Progress Notes (Signed)
06/07/2023 Name: Paul [name] MRN: [id number] DOB: 09-Dec-1950  Chief Complaint  Patient presents with   Diabetes   Medication Management    Paul [name] is a 73 y.o. year old male who presented for a telephone visit.   They were referred to the pharmacist by their PCP for assistance in managing complex medication management.    Subjective:  Care Team: Primary Care Provider: Tollie Eth, NP ; Next Scheduled Visit: 11/06/23  Medication Access/Adherence  Current Pharmacy:  [facility] [address] [place] [address] Phone: [phone] Fax: [phone]  [facility] - [place], [place] - [address] [place] [address] Phone: [phone] Fax: [phone]   Patient reports affordability concerns with their medications: No  Patient reports access/transportation concerns to their pharmacy: No  Patient reports adherence concerns with their medications:  No     Diabetes:  Current medications: Novolog ss, Toujeo 10 units, Metformin XR 1000mg  BID, Ozempic 0.25mg  Medications tried in the past: Glipizide  Current glucose readings:    Patient denies hypoglycemic s/sx including dizziness, shakiness, sweating. Patient denies hyperglycemic symptoms including polyuria, polydipsia, polyphagia, nocturia, neuropathy, blurred vision   Concerning pattern of lows but upon discussion with patient, he had become mixed up and was giving himself way more than prescribed of Novolog (20 units at a time)   Objective:  Lab Results  Component Value Date   HGBA1C 8.7 (H) 05/03/2023    Lab Results  Component Value Date   CREATININE 1.16 05/03/2023   BUN 16 05/03/2023   NA 137 05/03/2023   K 5.1 05/03/2023   CL 102 05/03/2023   CO2 23 05/03/2023    Lab Results  Component Value Date   CHOL 76 (L) 01/11/2023   HDL 23 (L) 01/11/2023   LDLCALC 15 01/11/2023   TRIG 255 (H) 01/11/2023    CHOLHDL 3.3 01/11/2023    Medications Reviewed Today     Reviewed by Sherrill Raring, RPH (Pharmacist) on 06/07/23 at 1355  Med List Status: <None>   Medication Order Taking? Sig Documenting Provider Last Dose Status Informant  acetaminophen (TYLENOL) 500 MG tablet [id number] Yes Take 1,000 mg by mouth every 8 (eight) hours as needed for moderate pain (pain score 4-6). [provider] Taking Active Self, Pharmacy Records           Med Note Arby Barrette   CZY Mar 17, 2023  2:52 PM)    albuterol (VENTOLIN HFA) 108 (90 Base) MCG/ACT inhaler [id number] Yes Inhale 2 puffs into the lungs every 4 (four) hours as needed. For breathing. Tollie Eth, NP Taking Active   aspirin EC 81 MG tablet [id number] Yes Take 1 tablet (81 mg total) by mouth daily. EarlySung Amabile, NP Taking Active   Continuous Glucose Receiver (FREESTYLE LIBRE 3 READER) DEVI [id number]  Use with freestyle libre 3 sensor Tysinger, [name], PA-C  Active Self, Pharmacy Records  Continuous Glucose Sensor (FREESTYLE LIBRE 3 PLUS SENSOR) [place] [id number] Yes Change sensor every 15 days. Tollie Eth, NP Taking Active   gabapentin (NEURONTIN) 300 MG capsule [id number] Yes Take 2 capsules (600 mg total) by mouth 3 (three) times daily. For back and foot pain. Tollie Eth, NP Taking Active   glucose blood (ONETOUCH VERIO) test strip [id number]  Use to check blood glucose 3 times daily   Active Self, Pharmacy Records  HYDROcodone-acetaminophen (NORCO/VICODIN) 5-325 MG tablet [id number] Yes Take  1-2 tablets by mouth every morning, 1 tablet at noon, and 1 tablet at bedtime.  Taking Active   HYDROcodone-acetaminophen (NORCO/VICODIN) 5-325 MG tablet [id number]  Take 1-2 tablets by mouth every morning, 1 tablet at noon, and 1 tablet at bedtime.   Active   HYDROcodone-acetaminophen (NORCO/VICODIN) 5-325 MG tablet [id number]  Take 1-2 tablets by mouth every morning, 1 tablet at noon, and 1 tablet at bedtime.   Active   insulin aspart (NOVOLOG) 100  UNIT/ML injection [id number] Yes Inject 0-9 Units into the skin 3 (three) times daily before meals. Sliding scale CBG 70-120 0 UNITS CBG 121-150 1 UNIT CBG 151-200 2 UNITS CGB 201-250 3 UNITS CGB 251-300 5 UNITS CGB 301-350 7 UNITS CGB 351-400 9 UNITS HIGHER TANH 400 CALL DOCTOR Early, Sung Amabile, NP Taking Active   insulin glargine, 1 Unit Dial, (TOUJEO SOLOSTAR) 300 UNIT/ML Solostar Pen [id number] Yes Inject 10 Units into the skin daily at 6 (six) AM. Early, Sung Amabile, NP Taking Active   Insulin Pen Needle 32G X 4 MM MISC [id number]  Use daily with insulin as directed Early, Sung Amabile, NP  Active Self, Pharmacy Records  metFORMIN (GLUCOPHAGE-XR) 500 MG 24 hr tablet [id number] Yes Take 2 tablets (1,000 mg total) by mouth 2 (two) times daily. Tollie Eth, NP Taking Active   metoprolol tartrate (LOPRESSOR) 25 MG tablet [id number] Yes Take 1 tablet (25 mg total) by mouth 2 (two) times daily. Tollie Eth, NP Taking Active   ondansetron (ZOFRAN) 4 MG tablet [id number] Yes Take 1 tablet (4 mg total) by mouth every 8 (eight) hours as needed for nausea or vomiting. Tollie Eth, NP Taking Active   pantoprazole (PROTONIX) 40 MG tablet [id number] Yes Take 1 tablet (40 mg total) by mouth daily. Tollie Eth, NP Taking Active   rosuvastatin (CRESTOR) 20 MG tablet [id number] Yes Take 1 tablet (20 mg total) by mouth daily. Tollie Eth, NP Taking Active   Semaglutide (OZEMPIC, 0.25 OR 0.5 MG/DOSE, [place]) [id number] Yes Inject 0.25 mg into the skin once a week. Inject 0.25mg  into skin once weekly for 4 weeks. Then increase to 0.5mg . PAP pending. [provider] Taking Active            Med Note Sherrill Raring   Thu Jun 07, 2023  1:35 PM) Take every thursday  umeclidinium-vilanterol [facility] ELLIPTA) 62.5-25 MCG/ACT AEPB [id number] Yes Inhale 1 puff into the lungs daily. Tollie Eth, NP Taking Active   zolpidem (AMBIEN) 10 MG tablet [id number] Yes Take 1 tablet (10 mg total) by mouth at bedtime as needed for sleep.  Taking  Active               Assessment/Plan:   Diabetes: - Currently uncontrolled - Reviewed long term cardiovascular and renal outcomes of uncontrolled blood sugar - Reviewed goal A1c, goal fasting, and goal 2 hour post prandial glucose - Reviewed dietary modifications including low carb diet: -INCREASE Ozempic to 0.5mg  next Thursday 06/14/23 (giving fourth dose of 0.25mg  today). Patient PAP approved and has been picked up. -Counseled on correct admin of Novolog insulin, patient expressed understanding    Follow Up Plan: 3 weeks  Sherrill Raring, PharmD Clinical Pharmacist [phone]

## 2023-06-08 ENCOUNTER — Other Ambulatory Visit (HOSPITAL_COMMUNITY): Payer: Self-pay

## 2023-06-08 ENCOUNTER — Telehealth: Payer: Self-pay

## 2023-06-08 NOTE — Telephone Encounter (Signed)
PAP: Patient assistance application for Paul [name] has been approved by PAP Companies: [employer] from 06/08/2023 to 04/16/2024. Medication should be delivered to PAP Delivery: Home. For further shipping updates, please contact [employer] ([employer]) at [phone]. Patient ID is: [id number]   PLEASE BE ADVISED I HAVE SCANNED LETTER IN MEDIA OF CHART

## 2023-06-12 ENCOUNTER — Other Ambulatory Visit (HOSPITAL_COMMUNITY): Payer: Self-pay

## 2023-06-13 ENCOUNTER — Telehealth: Payer: Self-pay

## 2023-06-13 NOTE — Progress Notes (Signed)
   06/13/2023  Patient ID: Paul [name], male   DOB: 1950/11/12, 73 y.o.   MRN: [id number]  Patient called in stating that his freestyle sensors are not giving a sugar reading on his libre 3 app. Sensors paired but is not giving a BG output reading.  Instructed patient to make sure his phone and app software are up to date with the latest software updates. Attempt to turn off/on phone and app to reset.  Patient will call back if this does not resolve the issue.  Sherrill Raring, PharmD Clinical Pharmacist [phone]

## 2023-06-14 ENCOUNTER — Ambulatory Visit: Payer: Medicare Other

## 2023-06-14 DIAGNOSIS — E1122 Type 2 diabetes mellitus with diabetic chronic kidney disease: Secondary | ICD-10-CM

## 2023-06-14 DIAGNOSIS — Z794 Long term (current) use of insulin: Secondary | ICD-10-CM

## 2023-06-14 DIAGNOSIS — N1831 Chronic kidney disease, stage 3a: Secondary | ICD-10-CM

## 2023-06-14 NOTE — Progress Notes (Signed)
   06/14/2023  Patient ID: Paul [name], male   DOB: [birth year], 73 y.o.   MRN: [id number]  Patient called in stating that his freestyle 3+ sensors were still not giving BG readings on his phone. Setup an appointment to come into office today.  Presents in office with two faulty sensors. One has multiple frayed needles and other one would not pair.  Provided 2 samples in office to replace faulty sensors. Applied one in office today and it has successfully paired and is providing BG readings.  Sherrill Raring, PharmD Clinical Pharmacist [phone]

## 2023-06-19 ENCOUNTER — Other Ambulatory Visit: Payer: Self-pay | Admitting: Nurse Practitioner

## 2023-06-19 DIAGNOSIS — M1A09X0 Idiopathic chronic gout, multiple sites, without tophus (tophi): Secondary | ICD-10-CM

## 2023-06-26 ENCOUNTER — Ambulatory Visit (INDEPENDENT_AMBULATORY_CARE_PROVIDER_SITE_OTHER): Payer: Medicare Other | Admitting: Dermatology

## 2023-06-26 ENCOUNTER — Encounter: Payer: Self-pay | Admitting: Dermatology

## 2023-06-26 VITALS — BP 117/70 | HR 74

## 2023-06-26 DIAGNOSIS — Z85828 Personal history of other malignant neoplasm of skin: Secondary | ICD-10-CM

## 2023-06-26 DIAGNOSIS — Z48817 Encounter for surgical aftercare following surgery on the skin and subcutaneous tissue: Secondary | ICD-10-CM

## 2023-06-26 DIAGNOSIS — L905 Scar conditions and fibrosis of skin: Secondary | ICD-10-CM

## 2023-06-26 DIAGNOSIS — C4492 Squamous cell carcinoma of skin, unspecified: Secondary | ICD-10-CM

## 2023-06-26 NOTE — Progress Notes (Signed)
   Follow Up Visit   Subjective  Paul [name] is a 73 y.o. male who presents for the following: follow up from Mohs surgery   The patient presents for follow up from Mohs surgery for a SCC on the left dorsal hand, treated on 05/01/23, repaired with FTSG. The patient has been bandaging the wound as directed. The endorse the following concerns: pain   The following portions of the chart were reviewed this encounter and updated as appropriate: medications, allergies, medical history  Review of Systems:  No other skin or systemic complaints except as noted in HPI or Assessment and Plan.  Objective  Well appearing patient in no apparent distress; mood and affect are within normal limits.  A full examination was performed including scalp, head, face and left dorsal hand. All findings within normal limits unless otherwise noted below.  Healing wound with mild erythema  Relevant physical exam findings are noted in the Assessment and Plan.    Assessment & Plan   Healing s/p Mohs for [facility], treated on 05/01/23, repaired with FTSG - Well Healed - No swelling, induration, purulence, dehiscence, or tenderness out of proportion to the clinical exam, see photo above - Discussed that scars take up to 12 months to mature from the date of surgery - Recommend SPF 30+ to scar daily to prevent purple color from UV exposure during scar maturation process - Discussed that erythema and raised appearance of scar will fade over the next 4-6 months - Discussed that sensory nerves can take up to 61-2 months to regenerate - If pain not improved at next visit, will consider hand surgery  HISTORY OF SQUAMOUS CELL CARCINOMA OF THE SKIN - No evidence of recurrence today - No lymphadenopathy - Recommend regular full body skin exams - Recommend daily broad spectrum sunscreen SPF 30+ to sun-exposed areas, reapply every 2 hours as needed.  - Call if any new or changing lesions are noted between office visits  Return  in about 2 months (around 08/26/2023) for wound check.  I, Tillie Fantasia, CMA, am acting as scribe for Gwenith Daily, MD.   Documentation: I have reviewed the above documentation for accuracy and completeness, and I agree with the above.  Gwenith Daily, MD

## 2023-06-26 NOTE — Patient Instructions (Signed)

## 2023-06-28 ENCOUNTER — Other Ambulatory Visit (HOSPITAL_COMMUNITY): Payer: Self-pay

## 2023-06-28 ENCOUNTER — Other Ambulatory Visit: Payer: Medicare Other

## 2023-06-28 DIAGNOSIS — E1122 Type 2 diabetes mellitus with diabetic chronic kidney disease: Secondary | ICD-10-CM

## 2023-06-28 DIAGNOSIS — N1831 Chronic kidney disease, stage 3a: Secondary | ICD-10-CM

## 2023-06-28 DIAGNOSIS — Z794 Long term (current) use of insulin: Secondary | ICD-10-CM

## 2023-06-28 MED ORDER — HYDROCODONE-ACETAMINOPHEN 5-325 MG PO TABS
1.0000 | ORAL_TABLET | Freq: Four times a day (QID) | ORAL | 0 refills | Status: DC
  Filled 2023-06-29 – 2023-07-02 (×2): qty 120, 30d supply, fill #0

## 2023-06-28 NOTE — Progress Notes (Addendum)
 06/28/2023 Name: Paul [name] MRN: [id number] DOB: 1951/04/04  Chief Complaint  Patient presents with   Diabetes   Medication Management    Paul [name] is a 73 y.o. year old male who presented for a telephone visit.   They were referred to the pharmacist by their PCP for assistance in managing complex medication management.    Subjective:  Care Team: Primary Care Provider: Tollie Eth, NP ; Next Scheduled Visit: 11/06/23  Medication Access/Adherence  Current Pharmacy:  [facility] [address] [place] [address] Phone: [phone] Fax: [phone]  [facility] - [place], [place] - [address] [place] [address] Phone: [phone] Fax: [phone]   Patient reports affordability concerns with their medications: No  Patient reports access/transportation concerns to their pharmacy: No  Patient reports adherence concerns with their medications:  No     Diabetes:  Current medications: Novolog ss, Toujeo 10 units, Metformin XR 1000mg  BID Medications tried in the past: Glipizide, Ozempic (N/V)  Current glucose readings:    Patient denies hypoglycemic s/sx including dizziness, shakiness, sweating. Patient denies hyperglycemic symptoms including polyuria, polydipsia, polyphagia, nocturia, neuropathy, blurred vision   Patient's sugars were responding well to ozempic, no more lows. However, patient has stopped ozempic as of this week due to nausea and vomiting despite zofran. Does not wish to continue taking   Objective:  Lab Results  Component Value Date   HGBA1C 8.7 (H) 05/03/2023    Lab Results  Component Value Date   CREATININE 1.16 05/03/2023   BUN 16 05/03/2023   NA 137 05/03/2023   K 5.1 05/03/2023   CL 102 05/03/2023   CO2 23 05/03/2023    Lab Results  Component Value Date   CHOL 76 (L) 01/11/2023   HDL 23 (L) 01/11/2023   LDLCALC 15 01/11/2023   TRIG  255 (H) 01/11/2023   CHOLHDL 3.3 01/11/2023    Medications Reviewed Today     Reviewed by Sherrill Raring, RPH (Pharmacist) on 06/28/23 at 1416  Med List Status: <None>   Medication Order Taking? Sig Documenting Provider Last Dose Status Informant  acetaminophen (TYLENOL) 500 MG tablet [id number]  Take 1,000 mg by mouth every 8 (eight) hours as needed for moderate pain (pain score 4-6). [provider]  Active Self, Pharmacy Records           Med Note Arby Barrette   DGL Mar 17, 2023  2:52 PM)    albuterol (VENTOLIN HFA) 108 (90 Base) MCG/ACT inhaler [id number]  Inhale 2 puffs into the lungs every 4 (four) hours as needed. For breathing. Tollie Eth, NP  Active   aspirin EC 81 MG tablet [id number]  Take 1 tablet (81 mg total) by mouth daily. Tollie Eth, NP  Active   BD PEN NEEDLE NANO 2ND GEN 32G X 4 MM MISC [id number]  USE DAILY WITH INSULIN AS DIRECTED Early, Sung Amabile, NP  Active   Budeson-Glycopyrrol-Formoterol (BREZTRI AEROSPHERE) 160-9-4.8 MCG/ACT Sandrea Matte [id number]  Inhale into the lungs. 2 puffs BID - AZ&Me PAP [provider]  Active   Continuous Glucose Receiver (FREESTYLE LIBRE 3 READER) DEVI [id number]  Use with freestyle libre 3 sensor Tysinger, [name], PA-C  Active Self, Pharmacy Records  Continuous Glucose Sensor (FREESTYLE LIBRE 3 PLUS SENSOR) [place] [id number]  Change sensor every 15 days. Tollie Eth, NP  Active   dapagliflozin propanediol (FARXIGA) 10 MG TABS tablet [id number]  Yes Take 10 mg by mouth daily. Starting 06/28/23 with samples. PAP pending [provider]  Active   gabapentin (NEURONTIN) 300 MG capsule [id number]  Take 2 capsules (600 mg total) by mouth 3 (three) times daily. For back and foot pain. Tollie Eth, NP  Active   glucose blood (ONETOUCH VERIO) test strip [id number]  Use to check blood glucose 3 times daily   Active Self, Pharmacy Records  HYDROcodone-acetaminophen (NORCO/VICODIN) 5-325 MG tablet [id number]  Take 1-2 tablets by mouth  every morning, 1 tablet at noon, and 1 tablet at bedtime.   Active   HYDROcodone-acetaminophen (NORCO/VICODIN) 5-325 MG tablet [id number]  Take 1-2 tablets by mouth every morning, 1 tablet at noon, and 1 tablet at bedtime.   Active   HYDROcodone-acetaminophen (NORCO/VICODIN) 5-325 MG tablet [id number]  Take 1-2 tablets by mouth every morning, 1 tablet at noon, and 1 tablet at bedtime.   Active   HYDROcodone-acetaminophen (NORCO/VICODIN) 5-325 MG tablet [id number]  Take 1-2 tablets every morning, 1 tablet at noon and 1 tablet at bedtime (DNF 07/01/23)   Active   insulin aspart (NOVOLOG) 100 UNIT/ML injection [id number]  Inject 0-9 Units into the skin 3 (three) times daily before meals. Sliding scale CBG 70-120 0 UNITS CBG 121-150 1 UNIT CBG 151-200 2 UNITS CGB 201-250 3 UNITS CGB 251-300 5 UNITS CGB 301-350 7 UNITS CGB 351-400 9 UNITS HIGHER TANH 400 CALL DOCTOR Early, Sung Amabile, NP  Active   insulin glargine, 1 Unit Dial, (TOUJEO SOLOSTAR) 300 UNIT/ML Solostar Pen [id number]  Inject 10 Units into the skin daily at 6 (six) AM. Early, Sung Amabile, NP  Active   metFORMIN (GLUCOPHAGE-XR) 500 MG 24 hr tablet [id number]  Take 2 tablets (1,000 mg total) by mouth 2 (two) times daily. Tollie Eth, NP  Active   metoprolol tartrate (LOPRESSOR) 25 MG tablet [id number]  Take 1 tablet (25 mg total) by mouth 2 (two) times daily. Tollie Eth, NP  Active   ondansetron (ZOFRAN) 4 MG tablet [id number]  Take 1 tablet (4 mg total) by mouth every 8 (eight) hours as needed for nausea or vomiting. Tollie Eth, NP  Active   pantoprazole (PROTONIX) 40 MG tablet [id number]  Take 1 tablet (40 mg total) by mouth daily. Tollie Eth, NP  Active   rosuvastatin (CRESTOR) 20 MG tablet [id number]  Take 1 tablet (20 mg total) by mouth daily. Tollie Eth, NP  Active   umeclidinium-vilanterol [facility] ELLIPTA) 62.5-25 MCG/ACT AEPB [id number]  Inhale 1 puff into the lungs daily. Tollie Eth, NP  Active            Med Note Leitha Bleak, Milas Kocher   Thu Jun 14, 2023 12:43 PM) Finishing up supply he has on hand before switching to breztri  zolpidem (AMBIEN) 10 MG tablet [id number]  Take 1 tablet (10 mg total) by mouth at bedtime as needed for sleep.   Active               Assessment/Plan:   Diabetes: - Currently uncontrolled but improving - Reviewed long term cardiovascular and renal outcomes of uncontrolled blood sugar - Reviewed goal A1c, goal fasting, and goal 2 hour post prandial glucose - Reviewed dietary modifications including low carb diet: -STOP Ozempic per patient wishes due to N/V despite zofran use. START Farxiga 10mg  daily, PAP submitted, pending decision.    Follow Up Plan: 3 weeks  Sherrill Raring, PharmD Clinical Pharmacist [phone]

## 2023-06-28 NOTE — Progress Notes (Signed)
 Office Visit    Patient Name: Paul [name] Date of Encounter: 06/28/2023  PCP:  Tollie Eth, NP   [facility]  Cardiologist:  Katianne Barre [place], MD  Advanced Practice Provider:  No care team member to display Electrophysiologist:  None }  HPI    Paul [name] is a 73 y.o. male with past medical history of CAD status PCI subsequent CABG 01/25/2021 (LIMA to LAD free graft, jump off vein graft proximally, R SVG to OM, type 2 diabetes mellitus, hyperlipidemia, COPD, HTN, OSA, and mild carotid artery disease presents today for follow-up appointment.  Previously seen by Dr. Herbie Baltimore with history of CAD and PCI to D1 11/2011.  He was seen 12/13/2020 by me noting increasing precordial pain and exertional dyspnea.  Cardiac catheterization 12/28/2020 showed extensive LAD and circumflex disease with occluded diagonal and recommended for cardiothoracic surgery consult.  Underwent CABG x 2 on 01/25/2021 by Dr. Cliffton Asters.   He was seen by Eligha Bridegroom, NP 04/03/2022 for evaluation of shortness of breath and fatigue.  He could not complete his normal activities.  He endorsed exhaustion by walking to the office that day.  Additionally, activity was limited by chronic back pain.  Echo and Myoview studies were done and were normal.   He was admitted in Dec with generalized weakness and was treated for hypoglycemia. Insulin adjusted.   On follow up today he continues to complain of fatigue. Just gives out with any activity. He attributes this to his DM. Intolerant of Ozempic. Last A1c 8.7%. minimal right sided chest pain if he gets really stressed. BP has been good.    Past Medical History    Past Medical History:  Diagnosis Date   Angina, class III (HCC)    Anxiety    Arthritis    Bilateral carotid artery stenosis    BPH without obstruction/lower urinary tract symptoms    CAD 11/24/2011   a) Mild-to-moderate 30-40% lesions in the RCA, LAD and Circumflex. b) CULPRIT LESION:  long tubular 70-80% lesion in D1 with FFR of 0.7 --> PCI w/ Xience Xpedition DES 2.5 mm x 30 mm (2.65 MM); c) Lexiscan Myoview 11/2013: No Ischemia or Infarct (Inferior Gut Attenuation) EF 63%.   Chronic fatigue syndrome 06/13/2021   Chronic heart failure (HCC)    Chronic kidney disease (CKD), stage III (moderate) (HCC)    Chronic pain syndrome    Cirrhosis (HCC)    Constipation by delayed colonic transit 06/13/2021   Depression    Diverticulosis    Dysphagia    Elevated alkaline phosphatase level 08/17/2022   Elevated levels of transaminase & lactic acid dehydrogenase 06/09/2016   Exertional dyspnea, chronic    Fall 02/16/2023   Former smoker    GERD (gastroesophageal reflux disease)    Gout    H/O: GI bleed    Hearing loss    Hypo-osmolality and hyponatremia 03/11/2019   Hypoglycemia 03/18/2023   Hypotension due to drugs 10/09/2022   Iron deficiency anemia    Lipoprotein deficiency disorder    Long term (current) use of insulin (HCC) 07/15/2019   Malnutrition of moderate degree 03/19/2023   Obesity (BMI 30.0-34.9)    OSA (obstructive sleep apnea), uses oxygen at home did not tolerate cpap 11/24/2011   Pain due to onychomycosis of toenails of both feet 08/23/2022   Pain in both lower extremities 02/14/2022   Paresthesia of skin 02/17/2015   Peripheral vascular disease (HCC)    Portal hypertension (HCC)    Right  hip pain 06/13/2021   S/P CABG (coronary artery bypass graft)    Shoulder joint pain 02/17/2015   Spinal stenosis    Splenomegaly    Thrombocytopenia (HCC)    Vitamin D deficiency    Past Surgical History:  Procedure Laterality Date   CERVICAL SPINE SURGERY  2012   CORONARY ANGIOPLASTY WITH STENT PLACEMENT  11/23/2011   "1; first one"   CORONARY ARTERY BYPASS GRAFT N/A 01/25/2021   Procedure: CORONARY ARTERY BYPASS GRAFTING (CABG) X2, USING LEFT INTERNAL MAMMARY ARTERY AND LEFT LEG GREATER SAPHENOUS VEIN HARVESTED ENDOSCOPICALLY;  Surgeon: Corliss Skains, MD;   Location: [facility] OR;  Service: Open Heart Surgery;  Laterality: N/A;   ENDOVEIN HARVEST OF GREATER SAPHENOUS VEIN Bilateral 01/25/2021   Procedure: ENDOVEIN HARVEST OF GREATER SAPHENOUS VEIN;  Surgeon: Corliss Skains, MD;  Location: [facility] OR;  Service: Open Heart Surgery;  Laterality: Bilateral;   LEFT HEART CATH AND CORONARY ANGIOGRAPHY N/A 12/28/2020   Procedure: LEFT HEART CATH AND CORONARY ANGIOGRAPHY;  Surgeon: Marykay Lex, MD;  Location: [facility] INVASIVE CV LAB;  Service: Cardiovascular;  Laterality: N/A;   LEFT HEART CATHETERIZATION WITH CORONARY ANGIOGRAM N/A 11/23/2011   Procedure: LEFT HEART CATHETERIZATION WITH CORONARY ANGIOGRAM;  Surgeon: Marykay Lex, MD;  Location: [facility] CATH LAB;  Service: Cardiovascular;  Laterality: N/A;   LESION REMOVAL Left 03/06/2023   Procedure: Excision of skin lesion, left hand;  Surgeon: Santiago Glad, MD;  Location: [facility];  Service: Plastics;  Laterality: Left;   TEE WITHOUT CARDIOVERSION N/A 01/25/2021   Procedure: TRANSESOPHAGEAL ECHOCARDIOGRAM (TEE);  Surgeon: Corliss Skains, MD;  Location: [facility] OR;  Service: Open Heart Surgery;  Laterality: N/A;    Allergies  Allergies  Allergen Reactions   Morphine And Codeine Itching    Possible itching due to morphine 01/26/21   Cortisone     Due to diabetes   Other Other (See Comments)    Cats- eyes burn     EKGs/Labs/Other Studies Reviewed:   The following studies were reviewed today:   LHC 12/28/20   Prox LAD to Mid LAD lesion is 65% stenosed with 30% stenosed side branch in 1st Diag. (RFR 0.90-0.91)   1st Diag lesion is 100% stenosed. (In-stent Re-stenosis/occlusion)   Mid LAD lesion is 40% stenosed.   Mid LAD to Dist LAD lesion is 75% stenosed. (RFR 0.74)   Ost Cx to Mid Cx lesion is 55% stenosed with 20% stenosed side branch in 1st Mrg.   Mid Cx lesion is 90% stenosed.   Prox RCA lesion is 40% stenosed.   -------------------------------------------   The left  ventricular systolic function is normal.  The left ventricular ejection fraction is 50-55% by visual estimate.   LV end diastolic pressure is normal.   There is no aortic valve stenosis.   SUMMARY Severe 3 Vessel CAD:  100% ISR of Major D1 stent,  focal 65% prox LAD (@ D1) lesion - FFR 0.91 followed by 40% LAD @ SP1 then long 70% mid LAD (FFR 0.74 - SIGNIFICANT).  prox-mid LCx diffuse 55% with focal 90% just after AVG branch in LCx-Lateral OM2. Moderate Prox-mid RCA 35-40% @ bend  Poor LV Gram image, but appears to have preserved LVEF with normal LVEDP (2 D Echo ordered).      RECOMMENDATIONS With extensive LAD and LCx disease and occluded diagonal, best treatment option would likely be CVTS.  He has been on Plavix since his PCI. CVTS consult placed, 2D echo ordered, and  Plavix discontinued. Anticipate outpatient CVTS clinic visit with staged CABG after Plavix washout -> otherwise would need to consider extensive PCI of the LAD with focal PCI of LCx and attempted recanalization of occluded D1 stent Will need to continue aggressive risk factor modification/Guideline Directed Medical Management   Echo 12/28/20 1. Left ventricular ejection fraction, by estimation, is 60 to 65%. The  left ventricle has normal function. The left ventricle has no regional  wall motion abnormalities. Left ventricular diastolic parameters are  consistent with Grade I diastolic  dysfunction (impaired relaxation).   2. Right ventricular systolic function is normal. The right ventricular  size is normal.   3. The mitral valve is normal in structure. No evidence of mitral valve  regurgitation. No evidence of mitral stenosis.   4. The aortic valve is normal in structure. Aortic valve regurgitation is  not visualized. No aortic stenosis is present.   5. The inferior vena cava is normal in size with greater than 50%  respiratory variability, suggesting right atrial pressure of 3 mmHg.   VAS Pre CABG Carotid and  Extremities 01/21/21 Right Carotid: Velocities in the right ICA are consistent with a 1-39%  stenosis.   Left Carotid: Velocities in the left ICA are consistent with a 1-39%  stenosis.  Vertebrals: Bilateral vertebral arteries demonstrate antegrade flow.   Right ABI: Resting right ankle-brachial index is within normal range. No  evidence of significant right lower extremity arterial disease.  Left ABI: Resting left ankle-brachial index is within normal range. No  evidence of significant left lower extremity arterial disease.  Right Upper Extremity: Doppler waveforms decrease >50% with right radial  compression. Doppler waveforms remain within normal limits with right  ulnar compression.  Left Upper Extremity: Doppler waveform obliterate with left radial  compression. Doppler waveforms remain within normal limits with left ulnar  compression.   Echo 05/04/22: IMPRESSIONS     1. Left ventricular ejection fraction, by estimation, is 65 to 70%. The  left ventricle has normal function. The left ventricle has no regional  wall motion abnormalities. Left ventricular diastolic parameters are  consistent with Grade I diastolic  dysfunction (impaired relaxation).   2. Right ventricular systolic function is normal. The right ventricular  size is normal. Tricuspid regurgitation signal is inadequate for assessing  PA pressure.   3. The mitral valve is abnormal. Trivial mitral valve regurgitation.   4. The aortic valve was not well visualized. Aortic valve regurgitation  is not visualized. Aortic valve sclerosis/calcification is present,  without any evidence of aortic stenosis.   Comparison(s): Changes from prior study are noted. 12/28/2020: LVEF 60-65%.   Myoview 05/12/22: Study Highlights      The study is normal. The study is low risk.   No ST deviation was noted.   Left ventricular function is normal. End diastolic cavity size is normal.   Prior study available for comparison from  12/04/2013.  EKG:  EKG is  not ordered today.    Recent Labs: 03/18/2023: Magnesium 1.9 05/03/2023: ALT 15; BUN 16; Creatinine, Ser 1.16; Hemoglobin 14.0; Platelets 141; Potassium 5.1; Sodium 137  Recent Lipid Panel    Component Value Date/Time   CHOL 76 (L) 01/11/2023 1242   TRIG 255 (H) 01/11/2023 1242   HDL 23 (L) 01/11/2023 1242   CHOLHDL 3.3 01/11/2023 1242   CHOLHDL 6.6 11/23/2011 0846   VLDL 29 11/23/2011 0846   LDLCALC 15 01/11/2023 1242    Home Medications   No outpatient medications have been marked as  taking for the 07/02/23 encounter (Appointment) with [place], Fotini Lemus M, MD.     Review of Systems      All other systems reviewed and are otherwise negative except as noted above.  Physical Exam    VS:  There were no vitals taken for this visit. , BMI There is no height or weight on file to calculate BMI.  Wt Readings from Last 3 Encounters:  05/03/23 184 lb 6.4 oz (83.6 kg)  03/17/23 180 lb (81.6 kg)  03/06/23 185 lb 13.6 oz (84.3 kg)     GEN: Well nourished, well developed, in no acute distress. HEENT: normal. Neck: Supple, no JVD, carotid bruits, or masses. Cardiac: RRR, no murmurs, rubs, or gallops. No clubbing, cyanosis, edema.  Radials/PT 2+ and equal bilaterally.  Respiratory:  Respirations regular and unlabored, clear to auscultation bilaterally. GI: Soft, nontender, nondistended. MS: No deformity or atrophy. Skin: Warm and dry, no rash. Neuro:  Strength and sensation are intact. Psych: Normal affect.  Assessment & Plan    CAD status post PCI and ultimately CABG 12/2020 -no chest pain. Does have chronic DOE.  -Complains of a lot of fatigue.  -Echo and Myoview last year were normal. - given fatigue will discontinue metoprolol to see if this helps.   Hyperlipidemia -LDL 15 -on Crestor 20 mg daily.    Essential hypertension -well controlled  today -will stop metoprolol - start valsartan 80 mg daily  Fatigue - unclear etiology. - see  recommendations above  DM Type 2 -on insulin, Farxiga and metformin -symptoms of neuropathy - per PCP - A1c was 8.7%     Disposition: Follow up on year  Signed, Basia Mcginty [place], MD 06/28/2023, 7:19 AM [facility]

## 2023-06-29 ENCOUNTER — Other Ambulatory Visit (HOSPITAL_COMMUNITY): Payer: Self-pay

## 2023-07-02 ENCOUNTER — Ambulatory Visit: Payer: Medicare Other | Attending: Cardiology | Admitting: Cardiology

## 2023-07-02 ENCOUNTER — Other Ambulatory Visit (HOSPITAL_COMMUNITY): Payer: Self-pay

## 2023-07-02 ENCOUNTER — Encounter: Payer: Self-pay | Admitting: Cardiology

## 2023-07-02 VITALS — BP 132/64 | HR 73 | Ht 65.0 in | Wt 176.2 lb

## 2023-07-02 DIAGNOSIS — I10 Essential (primary) hypertension: Secondary | ICD-10-CM

## 2023-07-02 DIAGNOSIS — E785 Hyperlipidemia, unspecified: Secondary | ICD-10-CM | POA: Diagnosis not present

## 2023-07-02 DIAGNOSIS — I25118 Atherosclerotic heart disease of native coronary artery with other forms of angina pectoris: Secondary | ICD-10-CM

## 2023-07-02 DIAGNOSIS — Z951 Presence of aortocoronary bypass graft: Secondary | ICD-10-CM | POA: Diagnosis not present

## 2023-07-02 MED ORDER — VALSARTAN 80 MG PO TABS
80.0000 mg | ORAL_TABLET | Freq: Every day | ORAL | 3 refills | Status: DC
Start: 2023-07-02 — End: 2023-11-15
  Filled 2023-07-02: qty 90, 90d supply, fill #0
  Filled 2023-10-04 – 2023-10-26 (×2): qty 90, 90d supply, fill #1

## 2023-07-02 NOTE — Patient Instructions (Signed)
 Medication Instructions:  Stop Metoprolol  Start Valsartan 80 mg daily Continue all other medications *If you need a refill on your cardiac medications before your next appointment, please call your pharmacy*   Lab Work: None ordered   Testing/Procedures: None ordered   Follow-Up: At [facility], you and your health needs are our priority.  As part of our continuing mission to provide you with exceptional heart care, we have created designated Provider Care Teams.  These Care Teams include your primary Cardiologist (physician) and Advanced Practice Providers (APPs -  Physician Assistants and Nurse Practitioners) who all work together to provide you with the care you need, when you need it.  We recommend signing up for the patient portal called "MyChart".  Sign up information is provided on this After Visit Summary.  MyChart is used to connect with patients for Virtual Visits (Telemedicine).  Patients are able to view lab/test results, encounter notes, upcoming appointments, etc.  Non-urgent messages can be sent to your provider as well.   To learn more about what you can do with MyChart, go to [email or web address].    Your next appointment:  1 year    Call in Dec to schedule March appointment     Provider:  Dr.[name]

## 2023-07-03 ENCOUNTER — Other Ambulatory Visit (HOSPITAL_COMMUNITY): Payer: Self-pay

## 2023-07-03 MED ORDER — HYDROCODONE-ACETAMINOPHEN 5-325 MG PO TABS
ORAL_TABLET | ORAL | 0 refills | Status: DC
Start: 2023-08-01 — End: 2023-12-19
  Filled 2023-08-02: qty 120, 30d supply, fill #0

## 2023-07-03 MED ORDER — HYDROCODONE-ACETAMINOPHEN 5-325 MG PO TABS
ORAL_TABLET | ORAL | 0 refills | Status: DC
  Filled 2023-08-30 (×2): qty 120, 30d supply, fill #0

## 2023-07-03 MED ORDER — HYDROCODONE-ACETAMINOPHEN 5-325 MG PO TABS
ORAL_TABLET | ORAL | 0 refills | Status: DC
  Filled 2023-09-28: qty 120, 30d supply, fill #0

## 2023-07-10 ENCOUNTER — Telehealth: Payer: Self-pay

## 2023-07-10 NOTE — Progress Notes (Signed)
   07/10/2023  Patient ID: Paul [name], male   DOB: 03-10-1951, 73 y.o.   MRN: [id number]  Patient called in saying that he continues to "not feel so great." Reports he still feels fatigued, dry mouth, and weak, with nausea. He is wondering if farxiga is contributing.  Attempted to get patient to check BP/pulse while on phone but his machine needed new batteries. Asked patient about his hydration status, reports he is only drinking about 16oz of water per day, if that.  Pulled patient's sugar readings from libreview portal and no concerning sugar readings. Sugar is normal at time of call and no recent lows. Some elevated BG but none higher than upper 200s on occasion. GMI at this time 7.5%.  Encouraged patient to hydrate, rest. Get BP/pulse checked. Instructed him to schedule f/u with PCP if hydration does not alleviate these concerns or he begins to feel worse.   Follow Up: 07/12/23  Sherrill Raring, PharmD Clinical Pharmacist [phone]

## 2023-07-12 ENCOUNTER — Other Ambulatory Visit (INDEPENDENT_AMBULATORY_CARE_PROVIDER_SITE_OTHER)

## 2023-07-12 ENCOUNTER — Ambulatory Visit (INDEPENDENT_AMBULATORY_CARE_PROVIDER_SITE_OTHER): Admitting: Medical

## 2023-07-12 VITALS — BP 108/68 | HR 70 | Wt 180.4 lb

## 2023-07-12 DIAGNOSIS — E538 Deficiency of other specified B group vitamins: Secondary | ICD-10-CM | POA: Diagnosis not present

## 2023-07-12 DIAGNOSIS — Z87891 Personal history of nicotine dependence: Secondary | ICD-10-CM

## 2023-07-12 DIAGNOSIS — E1122 Type 2 diabetes mellitus with diabetic chronic kidney disease: Secondary | ICD-10-CM

## 2023-07-12 DIAGNOSIS — Z794 Long term (current) use of insulin: Secondary | ICD-10-CM

## 2023-07-12 DIAGNOSIS — E118 Type 2 diabetes mellitus with unspecified complications: Secondary | ICD-10-CM

## 2023-07-12 DIAGNOSIS — E1142 Type 2 diabetes mellitus with diabetic polyneuropathy: Secondary | ICD-10-CM | POA: Diagnosis not present

## 2023-07-12 DIAGNOSIS — J449 Chronic obstructive pulmonary disease, unspecified: Secondary | ICD-10-CM

## 2023-07-12 DIAGNOSIS — G4733 Obstructive sleep apnea (adult) (pediatric): Secondary | ICD-10-CM | POA: Diagnosis not present

## 2023-07-12 DIAGNOSIS — R7989 Other specified abnormal findings of blood chemistry: Secondary | ICD-10-CM | POA: Diagnosis not present

## 2023-07-12 DIAGNOSIS — N1831 Chronic kidney disease, stage 3a: Secondary | ICD-10-CM

## 2023-07-12 DIAGNOSIS — G894 Chronic pain syndrome: Secondary | ICD-10-CM

## 2023-07-12 DIAGNOSIS — I509 Heart failure, unspecified: Secondary | ICD-10-CM

## 2023-07-12 DIAGNOSIS — H9190 Unspecified hearing loss, unspecified ear: Secondary | ICD-10-CM

## 2023-07-12 DIAGNOSIS — E559 Vitamin D deficiency, unspecified: Secondary | ICD-10-CM | POA: Diagnosis not present

## 2023-07-12 DIAGNOSIS — R531 Weakness: Secondary | ICD-10-CM

## 2023-07-12 DIAGNOSIS — I25118 Atherosclerotic heart disease of native coronary artery with other forms of angina pectoris: Secondary | ICD-10-CM

## 2023-07-12 DIAGNOSIS — R5382 Chronic fatigue, unspecified: Secondary | ICD-10-CM

## 2023-07-12 DIAGNOSIS — Z79899 Other long term (current) drug therapy: Secondary | ICD-10-CM

## 2023-07-12 MED ORDER — CYANOCOBALAMIN 1000 MCG/ML IJ SOLN
1000.0000 ug | Freq: Once | INTRAMUSCULAR | Status: AC
  Administered 2023-07-12: 1000 ug via INTRAMUSCULAR

## 2023-07-12 NOTE — Progress Notes (Addendum)
 Subjective:  Paul [name] is a 73 y.o. male who presents for Chief Complaint  Patient presents with   Fatigue    Fatigue ongoing     Here for ongoing concerns of fatigue  He continues to note of energy, no motivation sometimes.  He is retired.  He lives with his male friend.  He eats twice a day.  He does not have a lot of motivation exercise.  He just saw cardiology recently and they did change his metoprolol this something else due to fatigue.  But otherwise he notes that cardiology said his heart was stable  He has gotten B12 shots in the past but not recently.  He would like another 1 of those as this sometimes helps his fatigue for several weeks  He does take his Breztri inhaler every day.  No recent breathing concerns.  He has a history of sleep apnea but could not tolerate CPAP  No chest pain, no shortness of breath, no worse swelling  He does feel down in his mood sometimes but this is more long-term, nothing new  No other aggravating or relieving factors.    No other c/o.     07/12/2023    3:31 PM 12/01/2022   10:39 AM 11/21/2022   12:07 PM 05/18/2022   12:32 PM 11/07/2021    8:22 PM  Depression screen PHQ 2/9  Decreased Interest 3 0 0 0 0  Down, Depressed, Hopeless 1 1 0 0 0  PHQ - 2 Score 4 1 0 0 0  Altered sleeping 0 3 0  0  Tired, decreased energy 3 3 0  0  Change in appetite 0 1 0  0  Feeling bad or failure about yourself  0 0 0  0  Trouble concentrating 1 2 0  0  Moving slowly or fidgety/restless 0 0 0  0  Suicidal thoughts 0 0 0  0  PHQ-9 Score 8 10 0  0  Difficult doing work/chores Very difficult Somewhat difficult Not difficult at all  Not difficult at all     Past Medical History:  Diagnosis Date   Angina, class III (HCC)    Anxiety    Arthritis    Bilateral carotid artery stenosis    BPH without obstruction/lower urinary tract symptoms    CAD 11/24/2011   a) Mild-to-moderate 30-40% lesions in the RCA, LAD and Circumflex. b) CULPRIT LESION: long  tubular 70-80% lesion in D1 with FFR of 0.7 --> PCI w/ Xience Xpedition DES 2.5 mm x 30 mm (2.65 MM); c) Lexiscan Myoview 11/2013: No Ischemia or Infarct (Inferior Gut Attenuation) EF 63%.   Chronic fatigue syndrome 06/13/2021   Chronic heart failure (HCC)    Chronic kidney disease (CKD), stage III (moderate) (HCC)    Chronic pain syndrome    Cirrhosis (HCC)    Constipation by delayed colonic transit 06/13/2021   Depression    Diverticulosis    Dysphagia    Elevated alkaline phosphatase level 08/17/2022   Elevated levels of transaminase & lactic acid dehydrogenase 06/09/2016   Exertional dyspnea, chronic    Fall 02/16/2023   Former smoker    GERD (gastroesophageal reflux disease)    Gout    H/O: GI bleed    Hearing loss    Hypo-osmolality and hyponatremia 03/11/2019   Hypoglycemia 03/18/2023   Hypotension due to drugs 10/09/2022   Iron deficiency anemia    Lipoprotein deficiency disorder    Long term (current) use of insulin (HCC) 07/15/2019  Malnutrition of moderate degree 03/19/2023   Obesity (BMI 30.0-34.9)    OSA (obstructive sleep apnea), uses oxygen at home did not tolerate cpap 11/24/2011   Pain due to onychomycosis of toenails of both feet 08/23/2022   Pain in both lower extremities 02/14/2022   Paresthesia of skin 02/17/2015   Peripheral vascular disease (HCC)    Portal hypertension (HCC)    Right hip pain 06/13/2021   S/P CABG (coronary artery bypass graft)    Shoulder joint pain 02/17/2015   Spinal stenosis    Splenomegaly    Thrombocytopenia (HCC)    Vitamin D deficiency    Current Outpatient Medications on File Prior to Visit  Medication Sig Dispense Refill   acetaminophen (TYLENOL) 500 MG tablet Take 1,000 mg by mouth every 8 (eight) hours as needed for moderate pain (pain score 4-6).     albuterol (VENTOLIN HFA) 108 (90 Base) MCG/ACT inhaler Inhale 2 puffs into the lungs every 4 (four) hours as needed. For breathing. 6.7 g 3   aspirin EC 81 MG tablet Take 1  tablet (81 mg total) by mouth daily. 90 tablet 3   BD PEN NEEDLE NANO 2ND GEN 32G X 4 MM MISC USE DAILY WITH INSULIN AS DIRECTED 90 each 3   Budeson-Glycopyrrol-Formoterol (BREZTRI AEROSPHERE) 160-9-4.8 MCG/ACT AERO Inhale into the lungs. 2 puffs BID - AZ&Me PAP     Continuous Glucose Sensor (FREESTYLE LIBRE 3 PLUS SENSOR) MISC Change sensor every 15 days. 6 each 3   dapagliflozin propanediol (FARXIGA) 10 MG TABS tablet Take 10 mg by mouth daily. Starting 06/28/23 with samples. PAP pending     gabapentin (NEURONTIN) 300 MG capsule Take 2 capsules (600 mg total) by mouth 3 (three) times daily. For back and foot pain. 540 capsule 3   HYDROcodone-acetaminophen (NORCO/VICODIN) 5-325 MG tablet Take 1-2 tablets every morning, 1 tablet at noon and 1 tablet at bedtime (DNF 07/01/23) 120 tablet 0   insulin aspart (NOVOLOG) 100 UNIT/ML injection Inject 0-9 Units into the skin 3 (three) times daily before meals. Sliding scale CBG 70-120 0 UNITS CBG 121-150 1 UNIT CBG 151-200 2 UNITS CGB 201-250 3 UNITS CGB 251-300 5 UNITS CGB 301-350 7 UNITS CGB 351-400 9 UNITS HIGHER TANH 400 CALL DOCTOR 120 mL 3   insulin glargine, 1 Unit Dial, (TOUJEO SOLOSTAR) 300 UNIT/ML Solostar Pen Inject 10 Units into the skin daily at 6 (six) AM. 4.5 mL 3   metFORMIN (GLUCOPHAGE-XR) 500 MG 24 hr tablet Take 2 tablets (1,000 mg total) by mouth 2 (two) times daily. 360 tablet 3   pantoprazole (PROTONIX) 40 MG tablet Take 1 tablet (40 mg total) by mouth daily. 90 tablet 1   rosuvastatin (CRESTOR) 20 MG tablet Take 1 tablet (20 mg total) by mouth daily. 90 tablet 3   valsartan (DIOVAN) 80 MG tablet Take 1 tablet (80 mg total) by mouth daily. 90 tablet 3   zolpidem (AMBIEN) 10 MG tablet Take 1 tablet (10 mg total) by mouth at bedtime as needed for sleep. 30 tablet 2   Continuous Glucose Receiver (FREESTYLE LIBRE 3 READER) DEVI Use with freestyle libre 3 sensor 1 each 1   glucose blood (ONETOUCH VERIO) test strip Use to check blood glucose 3  times daily 300 each 3   [START ON 08/01/2023] HYDROcodone-acetaminophen (NORCO/VICODIN) 5-325 MG tablet Take 1-2 tablets by mouth every morning AND 1 tablet daily at 12 noon AND 1 tablet at bedtime. 120 tablet 0   [START ON 09/29/2023] HYDROcodone-acetaminophen (NORCO/VICODIN) 5-325  MG tablet Take 1-2 tablets by mouth every morning AND 1 tablet daily at 12 noon AND 1 tablet at bedtime. 120 tablet 0   [START ON 08/30/2023] HYDROcodone-acetaminophen (NORCO/VICODIN) 5-325 MG tablet Take 1-2 tablets by mouth every morning AND 1 tablet daily at 12 noon AND 1 tablet at bedtime. 120 tablet 0   ondansetron (ZOFRAN) 4 MG tablet Take 1 tablet (4 mg total) by mouth every 8 (eight) hours as needed for nausea or vomiting. (Patient not taking: Reported on 07/12/2023) 20 tablet 2   No current facility-administered medications on file prior to visit.     The following portions of the patient's history were reviewed and updated as appropriate: allergies, current medications, past family history, past medical history, past social history, past surgical history and problem list.  ROS Otherwise as in subjective above    Objective: BP 108/68   Pulse 70   Wt 180 lb 6.4 oz (81.8 kg)   BMI 30.02 kg/m   General appearance: alert, no distress, well developed, well nourished Neck: supple, no lymphadenopathy, no thyromegaly, no masses, no bruits Heart: RRR, normal S1, S2, no murmurs Lungs: CTA bilaterally, no wheezes, rhonchi, or rales Pulses: 2+ radial pulses, 2+ pedal pulses, normal cap refill Ext: no edema Psych: Pleasant, answers question appropriately Neuro: CN II through XII intact, walks with cane, but no focal findings     Assessment: Encounter Diagnoses  Name Primary?   Chronic fatigue Yes   B12 deficiency    Coronary artery disease of native artery of native heart with stable angina pectoris (HCC)    Chronic heart failure, unspecified heart failure type (HCC)    Chronic kidney disease, stage 3a  (HCC)    Chronic obstructive pulmonary disease, unspecified COPD type (HCC)    Chronic pain syndrome    Diabetic polyneuropathy associated with type 2 diabetes mellitus (HCC)    DM (diabetes mellitus), type 2 with complications (HCC)    Former smoker    Hearing loss, unspecified hearing loss type, unspecified laterality    OSA and COPD overlap syndrome (HCC)    Polypharmacy    Vitamin D deficiency      Plan: We discussed his symptoms, concerns  I think his fatigue is probably multifactorial.  He has several chronic medical problems   he has a history of untreated sleep apnea as he did not tolerate CPAP.  He has emphysema changes on prior imaging and is on Breztri inhaler for COPD, former smoker quit years ago.  He has history of some depressed mood, he has polypharmacy  Consider adding SSRI or other antidepressant  B12 injection today and can do this monthly for the time being  Consider new trial of CPAP   I reviewed his cardiology notes from 10 days ago and they switched him from metoprolol to valsartan given the fatigue  Zaion was seen today for fatigue.  Diagnoses and all orders for this visit:  Chronic fatigue -     TSH -     Testosterone -     cyanocobalamin (VITAMIN B12) injection 1,000 mcg  B12 deficiency  Coronary artery disease of native artery of native heart with stable angina pectoris (HCC)  Chronic heart failure, unspecified heart failure type (HCC)  Chronic kidney disease, stage 3a (HCC)  Chronic obstructive pulmonary disease, unspecified COPD type (HCC)  Chronic pain syndrome  Diabetic polyneuropathy associated with type 2 diabetes mellitus (HCC)  DM (diabetes mellitus), type 2 with complications [facility])  Former smoker  Hearing loss,  unspecified hearing loss type, unspecified laterality  OSA and COPD overlap syndrome (HCC)  Polypharmacy  Vitamin D deficiency    Follow up: pending labs

## 2023-07-12 NOTE — Progress Notes (Addendum)
   07/12/2023  Patient ID: Paul [name], male   DOB: Apr 06, 1951, 73 y.o.   MRN: [id number]  Contacted patient via telephone to follow up on his fatigue (called in on 3/25 - see note in chart).  Has not increased his water intake as requested. Only at 2 cups per day. Reports nausea has resolved. Only dry mouth when he first wakes up in the morning.  Review of libreview report also shows no concerning sugar readings at this time.     Patient has been complaining of generalized weakness and fatigue for sometime now, at least since December per chart review. Cardio stopped Metoprolol on 07/02/23 to see if this would help fatigue.  Patient also has hx of requiring Vit B12 injection (last given 05/03/23). Patient last had Vit D/B12 checked on 08/17/22. TSH last checked 05/30/22. Update may be warranted to see if this could be source of fatigue.  Patient is wondering if farxiga could be cause of fatigue but when asked, does not note any worsening in fatigue since starting on 06/28/23. Find this highly unlikely as this complaint of fatigue is not new onset.   Patient requests to come in to be seen acutely, placed on provider schedule for today at 3pm.  Keeping f/u call on 07/19/23.  Sherrill Raring, PharmD Clinical Pharmacist [phone]

## 2023-07-13 LAB — TSH: TSH: 2.21 u[IU]/mL (ref 0.450–4.500)

## 2023-07-13 LAB — TESTOSTERONE: Testosterone: 93 ng/dL — ABNORMAL LOW (ref 264–916)

## 2023-07-13 NOTE — Progress Notes (Signed)
 See if lab can add prolactin level and LH/FSH due to low testosterone

## 2023-07-15 NOTE — Progress Notes (Signed)
 Testosterone hormone quite low.   If interested in started medication for this which could help with fatigue, schedule recheck visit to discuss.   And verify if he uses CPAP or not

## 2023-07-17 ENCOUNTER — Other Ambulatory Visit (HOSPITAL_COMMUNITY): Payer: Self-pay

## 2023-07-17 ENCOUNTER — Ambulatory Visit (INDEPENDENT_AMBULATORY_CARE_PROVIDER_SITE_OTHER): Admitting: Medical

## 2023-07-17 VITALS — BP 110/70 | HR 87

## 2023-07-17 DIAGNOSIS — R7989 Other specified abnormal findings of blood chemistry: Secondary | ICD-10-CM

## 2023-07-17 DIAGNOSIS — Z79899 Other long term (current) drug therapy: Secondary | ICD-10-CM

## 2023-07-17 DIAGNOSIS — Z125 Encounter for screening for malignant neoplasm of prostate: Secondary | ICD-10-CM | POA: Diagnosis not present

## 2023-07-17 MED ORDER — TESTOSTERONE 20.25 MG/ACT (1.62%) TD GEL
2.0000 | Freq: Every day | TRANSDERMAL | 2 refills | Status: DC
  Filled 2023-07-17 – 2023-08-02 (×2): qty 75, 30d supply, fill #0
  Filled 2023-09-27: qty 75, 30d supply, fill #1

## 2023-07-17 MED ORDER — EMPAGLIFLOZIN 10 MG PO TABS
10.0000 mg | ORAL_TABLET | Freq: Every day | ORAL | 2 refills | Status: DC
  Filled 2023-07-17: qty 30, 30d supply, fill #0

## 2023-07-17 NOTE — Progress Notes (Signed)
 Subjective:  Paul [name] is a 73 y.o. male who presents for Chief Complaint  Patient presents with   Consult    Testosterone options     Here for recheck on ongoing concerns of fatigue.  I just saw him last week for the same and we did blood work.  He is here to follow-up on low testosterone.  He may have been on testosterone years ago but does not recall.  Regarding fatigue, we gave him a B12 injection last visit he felt that helped some  He does report getting up 4-5 times per night to urinate.  He notes a lot of urination throughout the day.Marland Kitchen  He feels like this is related to the [place].  He has just learned to deal with this.  He is taking this for diabetes  He continues to note of energy, no motivation sometimes.  He is retired.  He lives with his male friend.  He eats twice a day.  He does not have a lot of motivation exercise.  He just saw cardiology recently and they did change his metoprolol this something else due to fatigue.  But otherwise he notes that cardiology said his heart was stable  He does take his Breztri inhaler every day.  No recent breathing concerns.  He has a history of sleep apnea but could not tolerate CPAP.  He is not interested in pursuing CPAP again  No chest pain, no shortness of breath, no worse swelling   No other aggravating or relieving factors.    No other c/o.     07/12/2023    3:31 PM 12/01/2022   10:39 AM 11/21/2022   12:07 PM 05/18/2022   12:32 PM 11/07/2021    8:22 PM  Depression screen PHQ 2/9  Decreased Interest 3 0 0 0 0  Down, Depressed, Hopeless 1 1 0 0 0  PHQ - 2 Score 4 1 0 0 0  Altered sleeping 0 3 0  0  Tired, decreased energy 3 3 0  0  Change in appetite 0 1 0  0  Feeling bad or failure about yourself  0 0 0  0  Trouble concentrating 1 2 0  0  Moving slowly or fidgety/restless 0 0 0  0  Suicidal thoughts 0 0 0  0  PHQ-9 Score 8 10 0  0  Difficult doing work/chores Very difficult Somewhat difficult Not difficult at all   Not difficult at all     Past Medical History:  Diagnosis Date   Angina, class III (HCC)    Anxiety    Arthritis    Bilateral carotid artery stenosis    BPH without obstruction/lower urinary tract symptoms    CAD 11/24/2011   a) Mild-to-moderate 30-40% lesions in the RCA, LAD and Circumflex. b) CULPRIT LESION: long tubular 70-80% lesion in D1 with FFR of 0.7 --> PCI w/ Xience Xpedition DES 2.5 mm x 30 mm (2.65 MM); c) Lexiscan Myoview 11/2013: No Ischemia or Infarct (Inferior Gut Attenuation) EF 63%.   Chronic fatigue syndrome 06/13/2021   Chronic heart failure (HCC)    Chronic kidney disease (CKD), stage III (moderate) (HCC)    Chronic pain syndrome    Cirrhosis (HCC)    Constipation by delayed colonic transit 06/13/2021   Depression    Diverticulosis    Dysphagia    Elevated alkaline phosphatase level 08/17/2022   Elevated levels of transaminase & lactic acid dehydrogenase 06/09/2016   Exertional dyspnea, chronic    Fall 02/16/2023  Former smoker    GERD (gastroesophageal reflux disease)    Gout    H/O: GI bleed    Hearing loss    Hypo-osmolality and hyponatremia 03/11/2019   Hypoglycemia 03/18/2023   Hypotension due to drugs 10/09/2022   Iron deficiency anemia    Lipoprotein deficiency disorder    Long term (current) use of insulin (HCC) 07/15/2019   Malnutrition of moderate degree 03/19/2023   Obesity (BMI 30.0-34.9)    OSA (obstructive sleep apnea), uses oxygen at home did not tolerate cpap 11/24/2011   Pain due to onychomycosis of toenails of both feet 08/23/2022   Pain in both lower extremities 02/14/2022   Paresthesia of skin 02/17/2015   Peripheral vascular disease (HCC)    Portal hypertension (HCC)    Right hip pain 06/13/2021   S/P CABG (coronary artery bypass graft)    Shoulder joint pain 02/17/2015   Spinal stenosis    Splenomegaly    Thrombocytopenia (HCC)    Vitamin D deficiency    Current Outpatient Medications on File Prior to Visit  Medication  Sig Dispense Refill   acetaminophen (TYLENOL) 500 MG tablet Take 1,000 mg by mouth every 8 (eight) hours as needed for moderate pain (pain score 4-6).     albuterol (VENTOLIN HFA) 108 (90 Base) MCG/ACT inhaler Inhale 2 puffs into the lungs every 4 (four) hours as needed. For breathing. 6.7 g 3   aspirin EC 81 MG tablet Take 1 tablet (81 mg total) by mouth daily. 90 tablet 3   BD PEN NEEDLE NANO 2ND GEN 32G X 4 MM MISC USE DAILY WITH INSULIN AS DIRECTED 90 each 3   Budeson-Glycopyrrol-Formoterol (BREZTRI AEROSPHERE) 160-9-4.8 MCG/ACT AERO Inhale into the lungs. 2 puffs BID - AZ&Me PAP     Continuous Glucose Receiver (FREESTYLE LIBRE 3 READER) DEVI Use with freestyle libre 3 sensor 1 each 1   Continuous Glucose Sensor (FREESTYLE LIBRE 3 PLUS SENSOR) MISC Change sensor every 15 days. 6 each 3   gabapentin (NEURONTIN) 300 MG capsule Take 2 capsules (600 mg total) by mouth 3 (three) times daily. For back and foot pain. 540 capsule 3   glucose blood (ONETOUCH VERIO) test strip Use to check blood glucose 3 times daily 300 each 3   HYDROcodone-acetaminophen (NORCO/VICODIN) 5-325 MG tablet Take 1-2 tablets every morning, 1 tablet at noon and 1 tablet at bedtime (DNF 07/01/23) 120 tablet 0   [START ON 08/01/2023] HYDROcodone-acetaminophen (NORCO/VICODIN) 5-325 MG tablet Take 1-2 tablets by mouth every morning AND 1 tablet daily at 12 noon AND 1 tablet at bedtime. 120 tablet 0   [START ON 09/29/2023] HYDROcodone-acetaminophen (NORCO/VICODIN) 5-325 MG tablet Take 1-2 tablets by mouth every morning AND 1 tablet daily at 12 noon AND 1 tablet at bedtime. 120 tablet 0   [START ON 08/30/2023] HYDROcodone-acetaminophen (NORCO/VICODIN) 5-325 MG tablet Take 1-2 tablets by mouth every morning AND 1 tablet daily at 12 noon AND 1 tablet at bedtime. 120 tablet 0   insulin aspart (NOVOLOG) 100 UNIT/ML injection Inject 0-9 Units into the skin 3 (three) times daily before meals. Sliding scale CBG 70-120 0 UNITS CBG 121-150 1 UNIT  CBG 151-200 2 UNITS CGB 201-250 3 UNITS CGB 251-300 5 UNITS CGB 301-350 7 UNITS CGB 351-400 9 UNITS HIGHER TANH 400 CALL DOCTOR 120 mL 3   insulin glargine, 1 Unit Dial, (TOUJEO SOLOSTAR) 300 UNIT/ML Solostar Pen Inject 10 Units into the skin daily at 6 (six) AM. 4.5 mL 3   metFORMIN (GLUCOPHAGE-XR) 500 MG  24 hr tablet Take 2 tablets (1,000 mg total) by mouth 2 (two) times daily. 360 tablet 3   ondansetron (ZOFRAN) 4 MG tablet Take 1 tablet (4 mg total) by mouth every 8 (eight) hours as needed for nausea or vomiting. (Patient not taking: Reported on 07/12/2023) 20 tablet 2   pantoprazole (PROTONIX) 40 MG tablet Take 1 tablet (40 mg total) by mouth daily. 90 tablet 1   rosuvastatin (CRESTOR) 20 MG tablet Take 1 tablet (20 mg total) by mouth daily. 90 tablet 3   valsartan (DIOVAN) 80 MG tablet Take 1 tablet (80 mg total) by mouth daily. 90 tablet 3   zolpidem (AMBIEN) 10 MG tablet Take 1 tablet (10 mg total) by mouth at bedtime as needed for sleep. 30 tablet 2   No current facility-administered medications on file prior to visit.     The following portions of the patient's history were reviewed and updated as appropriate: allergies, current medications, past family history, past medical history, past social history, past surgical history and problem list.  ROS Otherwise as in subjective above    Objective: BP 110/70   Pulse 87   General appearance: alert, no distress, well developed, well nourished Neck: supple, no lymphadenopathy, no thyromegaly, no masses, no bruits Heart: RRR, normal S1, S2, no murmurs Lungs: CTA bilaterally, no wheezes, rhonchi, or rales Pulses: 2+ radial pulses, 2+ pedal pulses, normal cap refill Ext: no edema Psych: Pleasant, answers question appropriately Neuro: CN II through XII intact, walks with cane, but no focal findings     Assessment: Encounter Diagnoses  Name Primary?   High risk medication use Yes   Screening for prostate cancer    Low  testosterone      Plan: We discussed his symptoms, concerns.  I think his fatigue is probably multifactorial.  He has several chronic medical problems, COPD, diabetes, history of sleep apnea and untreated, not tolerated CPAP in the past.  We discussed his recent labs showing low testosterone.  We discussed risk of testosterone including potential risk of blood clot, prostate enlargement, prostate cancer another potential risk.  We discussed the benefits of testosterone therapy.  He wants to try testosterone therapy.  He will get a baseline PSA and testosterone repeat lab today  We discussed different types of testosterone treatment.  We discussed risk and benefits of treatment and proper use of treatment.  He agrees to topical gel.  We will try to move forward with trial of AndroGel 1 pump each shoulder daily.  I feel like him getting up to urinate numerous times per night does not help with sleep or fatigue.  We will switch to [place] and stop Marcelline Deist as this tends to make people urinate more than Jardiance counterpart.  Samples were given and prescription sent  We discussed that he may need to add Flomax or other medicine to help with prostate depending on symptoms over the next few weeks  We discussed that he may need to look into inspire sleep apnea treatment and/or SSRor other antidepressant going forward  He has a history of untreated sleep apnea as he did not tolerate CPAP.  He has emphysema changes on prior imaging and is on Breztri inhaler for COPD, former smoker quit years ago.  He has history of some depressed mood, he has polypharmacy  I reviewed his cardiology notes from 10 days ago and they switched him from metoprolol to valsartan given the fatigue  Yamin was seen today for consult.  Diagnoses and all  orders for this visit:  High risk medication use -     PSA -     Testosterone  Screening for prostate cancer -     PSA -     Testosterone  Low testosterone -      Testosterone  Other orders -     empagliflozin (JARDIANCE) 10 MG TABS tablet; Take 1 tablet (10 mg total) by mouth daily before breakfast. -     Testosterone 20.25 MG/ACT (1.62%) GEL; Place 2 Applications onto the skin daily.    Follow up: pending labs

## 2023-07-18 ENCOUNTER — Other Ambulatory Visit (HOSPITAL_COMMUNITY): Payer: Self-pay

## 2023-07-18 ENCOUNTER — Encounter: Payer: Self-pay | Admitting: Medical

## 2023-07-18 DIAGNOSIS — E291 Testicular hypofunction: Secondary | ICD-10-CM | POA: Insufficient documentation

## 2023-07-18 DIAGNOSIS — R7989 Other specified abnormal findings of blood chemistry: Secondary | ICD-10-CM | POA: Insufficient documentation

## 2023-07-18 LAB — SPECIMEN STATUS REPORT

## 2023-07-18 LAB — TESTOSTERONE: Testosterone: 62 ng/dL — ABNORMAL LOW (ref 264–916)

## 2023-07-18 LAB — PSA: Prostate Specific Ag, Serum: 0.3 ng/mL (ref 0.0–4.0)

## 2023-07-18 LAB — FOLLICLE STIMULATING HORMONE: FSH: 8.1 m[IU]/mL (ref 1.5–12.4)

## 2023-07-18 LAB — LUTEINIZING HORMONE: LH: 6.2 m[IU]/mL (ref 1.7–8.6)

## 2023-07-18 LAB — PROLACTIN: Prolactin: 12.3 ng/mL (ref 3.6–25.2)

## 2023-07-19 ENCOUNTER — Other Ambulatory Visit (HOSPITAL_COMMUNITY): Payer: Self-pay

## 2023-07-19 ENCOUNTER — Telehealth: Payer: Self-pay

## 2023-07-19 ENCOUNTER — Other Ambulatory Visit

## 2023-07-19 DIAGNOSIS — E118 Type 2 diabetes mellitus with unspecified complications: Secondary | ICD-10-CM

## 2023-07-19 NOTE — Progress Notes (Signed)
 07/19/2023 Name: Paul [name] MRN: [id number] DOB: Aug 25, 1950  Chief Complaint  Patient presents with   Diabetes   Medication Management    Paul [name] is a 72 y.o. year old male who presented for a telephone visit.   They were referred to the pharmacist by their PCP for assistance in managing complex medication management.    Subjective:  Care Team: Primary Care Provider: Tollie Eth, NP ; Next Scheduled Visit: 09/20/23  Medication Access/Adherence  Current Pharmacy:  [facility] [address] [place] [address] Phone: [phone] Fax: [phone]  [facility] - [place], [place] - [address] [place] [address] Phone: [phone] Fax: [phone]   Patient reports affordability concerns with their medications: No  Patient reports access/transportation concerns to their pharmacy: No  Patient reports adherence concerns with their medications:  No     Diabetes:  Current medications: Novolog ss, Toujeo 10 units, Metformin XR 1000mg  BID, Jardiance 10mg  Medications tried in the past: Glipizide, Ozempic (N/V), Farxiga (frequent nighttime urination)  Current glucose readings:    Patient denies hypoglycemic s/sx including dizziness, shakiness, sweating. Patient denies hyperglycemic symptoms including polyuria, polydipsia, polyphagia, nocturia, neuropathy, blurred vision   Patient's sugars were responding well to ozempic, no more lows. However, patient has stopped ozempic as of this week due to nausea and vomiting despite zofran. Does not wish to continue taking.  Stopped farxiga due to frequent urination at night, trying Jardiance 10mg  samples. Patient does complain of frequent urination as well. Notably, sugars have been very elevated the last few days, over 400. Patient admits to eating a lot of candy.   Objective:  Lab Results  Component Value Date   HGBA1C 8.7 (H)  05/03/2023    Lab Results  Component Value Date   CREATININE 1.16 05/03/2023   BUN 16 05/03/2023   NA 137 05/03/2023   K 5.1 05/03/2023   CL 102 05/03/2023   CO2 23 05/03/2023    Lab Results  Component Value Date   CHOL 76 (L) 01/11/2023   HDL 23 (L) 01/11/2023   LDLCALC 15 01/11/2023   TRIG 255 (H) 01/11/2023   CHOLHDL 3.3 01/11/2023    Medications Reviewed Today     Reviewed by Sherrill Raring, RPH (Pharmacist) on 07/19/23 at 1545  Med List Status: <None>   Medication Order Taking? Sig Documenting Provider Last Dose Status Informant  acetaminophen (TYLENOL) 500 MG tablet [id number] Yes Take 1,000 mg by mouth every 8 (eight) hours as needed for moderate pain (pain score 4-6). [provider] Taking Active Self, Pharmacy Records           Med Note Arby Barrette   DGL Mar 17, 2023  2:52 PM)    albuterol (VENTOLIN HFA) 108 (90 Base) MCG/ACT inhaler [id number] Yes Inhale 2 puffs into the lungs every 4 (four) hours as needed. For breathing. Tollie Eth, NP Taking Active   aspirin EC 81 MG tablet [id number] Yes Take 1 tablet (81 mg total) by mouth daily. Tollie Eth, NP Taking Active   BD PEN NEEDLE NANO 2ND GEN 32G X 4 MM MISC [id number]  USE DAILY WITH INSULIN AS DIRECTED Early, Sung Amabile, NP  Active   Budeson-Glycopyrrol-Formoterol (BREZTRI AEROSPHERE) 160-9-4.8 MCG/ACT AERO [id number] Yes Inhale into the lungs. 2 puffs BID - AZ[employer]Me PAP [provider] Taking Active   Continuous Glucose Receiver (FREESTYLE LIBRE 3 READER) DEVI [id number]  Use  with freestyle libre 3 sensor Tysinger, [name], PA-C  Active Self, Pharmacy Records  Continuous Glucose Sensor (FREESTYLE LIBRE 3 PLUS SENSOR) [place] [id number] Yes Change sensor every 15 days. Tollie Eth, NP Taking Active   empagliflozin (JARDIANCE) 10 MG TABS tablet [id number] Yes Take 1 tablet (10 mg total) by mouth daily before breakfast. Tysinger, [name], PA-C Taking Active   gabapentin (NEURONTIN) 300 MG capsule  [id number] Yes Take 2 capsules (600 mg total) by mouth 3 (three) times daily. For back and foot pain. Tollie Eth, NP Taking Active   glucose blood (ONETOUCH VERIO) test strip [id number]  Use to check blood glucose 3 times daily   Active Self, Pharmacy Records  HYDROcodone-acetaminophen (NORCO/VICODIN) 5-325 MG tablet [id number]  Take 1-2 tablets every morning, 1 tablet at noon and 1 tablet at bedtime (DNF 07/01/23)   Active   HYDROcodone-acetaminophen (NORCO/VICODIN) 5-325 MG tablet [id number]  Take 1-2 tablets by mouth every morning AND 1 tablet daily at 12 noon AND 1 tablet at bedtime.   Active   HYDROcodone-acetaminophen (NORCO/VICODIN) 5-325 MG tablet [id number]  Take 1-2 tablets by mouth every morning AND 1 tablet daily at 12 noon AND 1 tablet at bedtime.   Active   HYDROcodone-acetaminophen (NORCO/VICODIN) 5-325 MG tablet [id number]  Take 1-2 tablets by mouth every morning AND 1 tablet daily at 12 noon AND 1 tablet at bedtime.   Active   insulin aspart (NOVOLOG) 100 UNIT/ML injection [id number] Yes Inject 0-9 Units into the skin 3 (three) times daily before meals. Sliding scale CBG 70-120 0 UNITS CBG 121-150 1 UNIT CBG 151-200 2 UNITS CGB 201-250 3 UNITS CGB 251-300 5 UNITS CGB 301-350 7 UNITS CGB 351-400 9 UNITS HIGHER TANH 400 CALL DOCTOR Early, Sung Amabile, NP Taking Active   insulin glargine, 1 Unit Dial, (TOUJEO SOLOSTAR) 300 UNIT/ML Solostar Pen [id number] Yes Inject 10 Units into the skin daily at 6 (six) AM. Early, Sung Amabile, NP Taking Active   metFORMIN (GLUCOPHAGE-XR) 500 MG 24 hr tablet [id number] Yes Take 2 tablets (1,000 mg total) by mouth 2 (two) times daily. Tollie Eth, NP Taking Active   ondansetron (ZOFRAN) 4 MG tablet [id number]  Take 1 tablet (4 mg total) by mouth every 8 (eight) hours as needed for nausea or vomiting.  Patient not taking: Reported on 07/12/2023   Tollie Eth, NP  Active   pantoprazole (PROTONIX) 40 MG tablet [id number] Yes Take 1 tablet (40 mg total) by mouth daily. Tollie Eth, NP Taking Active   rosuvastatin (CRESTOR) 20 MG tablet [id number] Yes Take 1 tablet (20 mg total) by mouth daily. Tollie Eth, NP Taking Active   Testosterone 20.25 MG/ACT (1.62%) GEL [id number]  Place 2 Applications onto the skin daily. Tysinger, [name], PA-C  Active   valsartan (DIOVAN) 80 MG tablet [id number] Yes Take 1 tablet (80 mg total) by mouth daily. [place], [name], MD Taking Active   zolpidem [facility]) 10 MG tablet [id number] Yes Take 1 tablet (10 mg total) by mouth at bedtime as needed for sleep.  Taking Active               Assessment/Plan:   Diabetes: - Currently uncontrolled but improving - Reviewed long term cardiovascular and renal outcomes of uncontrolled blood sugar - Reviewed goal A1c, goal fasting, and goal 2 hour post prandial glucose - Reviewed dietary modifications including low carb diet: -Continue current medication regimen as just started Jardiance. Pursing PAP through [facility], patient will come  by office to sign his portion. -Counseled on need to limit carb intake and that excessively high sugars will worsen the need to urinate frequently.  -Future consideration: Dose increase jardiance, may need to increase toujeo as well if sugars continue to elevate    Follow Up Plan: 1 month  Sherrill Raring, PharmD Clinical Pharmacist [phone]

## 2023-07-19 NOTE — Telephone Encounter (Signed)
 Pharmacy Patient Advocate Encounter  Received notification from [facility] that Prior Authorization for Testosterone 20.25 MG/1.25GM(1.62%) gel has been APPROVED from 3.4.25 to 4.3.26. Ran test claim, Copay is $40.00. This test claim was processed through [facility]- copay amounts may vary at other pharmacies due to pharmacy/plan contracts, or as the patient moves through the different stages of their insurance plan.   PA #/Case ID/Reference #: (Key: [id number])

## 2023-07-30 ENCOUNTER — Other Ambulatory Visit (HOSPITAL_COMMUNITY): Payer: Self-pay

## 2023-08-02 ENCOUNTER — Other Ambulatory Visit (HOSPITAL_COMMUNITY): Payer: Self-pay

## 2023-08-09 ENCOUNTER — Telehealth: Payer: Self-pay

## 2023-08-09 NOTE — Progress Notes (Signed)
   08/09/2023  Patient ID: Paul [name], male   DOB: [birth year], 73 y.o.   MRN: [id number]  Received notice from [facility] that patient's Jardiance  application needed physical proof of income before being processed further.  Provided examples of accepted proof of income, patient states he will bring by office tomorrow.  Carnell Christian, PharmD Clinical Pharmacist [phone]

## 2023-08-20 DIAGNOSIS — R262 Difficulty in walking, not elsewhere classified: Secondary | ICD-10-CM | POA: Diagnosis not present

## 2023-08-20 DIAGNOSIS — R296 Repeated falls: Secondary | ICD-10-CM | POA: Diagnosis not present

## 2023-08-20 DIAGNOSIS — M6281 Muscle weakness (generalized): Secondary | ICD-10-CM | POA: Diagnosis not present

## 2023-08-20 DIAGNOSIS — M5459 Other low back pain: Secondary | ICD-10-CM | POA: Diagnosis not present

## 2023-08-21 ENCOUNTER — Telehealth: Payer: Self-pay | Admitting: Internal Medicine

## 2023-08-21 ENCOUNTER — Other Ambulatory Visit: Payer: Self-pay | Admitting: Nurse Practitioner

## 2023-08-21 DIAGNOSIS — Z794 Long term (current) use of insulin: Secondary | ICD-10-CM

## 2023-08-21 NOTE — Telephone Encounter (Unsigned)
 Copied from CRM [id number]. Topic: General - Other >> Aug 21, 2023 12:36 PM Rosamond Comes wrote: Reason for CRM: patient calling in stating he receives letters 3 times regarding [employer] regarding free diabetes medication patient would like to know why the completed form has not been faxed to [phone]   Patient has been working with Redmond Candle PharmD Clinical Pharmacist   Please call patient regarding this issue [phone] ok to leave detailed message

## 2023-08-21 NOTE — Telephone Encounter (Signed)
 Pt's income was faxed to [facility] Friday 08/17/23 by Lovett Ruck, they were refaxed today.

## 2023-08-22 NOTE — Telephone Encounter (Signed)
 Called Pt to verify which pharmacy he wanted to use for his testosterone  gel. Left message for him to call back.

## 2023-08-23 ENCOUNTER — Other Ambulatory Visit

## 2023-08-23 DIAGNOSIS — E118 Type 2 diabetes mellitus with unspecified complications: Secondary | ICD-10-CM

## 2023-08-23 NOTE — Progress Notes (Signed)
 08/23/2023 Name: Paul [name] MRN: [id number] DOB: [birth year]  Chief Complaint  Patient presents with   Diabetes   Medication Management    Paul [name] is a 73 y.o. year old male who presented for a telephone visit.   They were referred to the pharmacist by their PCP for assistance in managing complex medication management.    Subjective:  Care Team: Primary Care Provider: Annella Kief, NP ; Next Scheduled Visit: 09/20/23  Medication Access/Adherence  Current Pharmacy:  [facility] [address] [place] [address] Phone: [phone] Fax: [phone]  [facility] - [place], [place] - [address] [place] [address] Phone: [phone] Fax: [phone]   Patient reports affordability concerns with their medications: No  Patient reports access/transportation concerns to their pharmacy: No  Patient reports adherence concerns with their medications:  No     Diabetes:  Current medications: Novolog  ss, Toujeo  10 units, Metformin  XR 1000mg  BID, Jardiance  10mg  Medications tried in the past: Glipizide, Ozempic (N/V), Farxiga (frequent nighttime urination)  Current glucose readings:    Patient denies hypoglycemic s/sx including dizziness, shakiness, sweating. Patient denies hyperglycemic symptoms including polyuria, polydipsia, polyphagia, nocturia, neuropathy, blurred vision   Patient's sugars were responding well to ozempic, no more lows. However, patient has stopped ozempic as of this week due to nausea and vomiting despite zofran . Does not wish to continue taking.  Stopped farxiga due to frequent urination at night, trying Jardiance  10mg  samples. Patient does complain of frequent urination as well but reports still tolerating okay.  PAP jardiance  still pending company approval as of today.  Admits to missing doses of his Novolog , which explains sugar elevations around  typical meal times   Objective:  Lab Results  Component Value Date   HGBA1C 8.7 (H) 05/03/2023    Lab Results  Component Value Date   CREATININE 1.16 05/03/2023   BUN 16 05/03/2023   NA 137 05/03/2023   K 5.1 05/03/2023   CL 102 05/03/2023   CO2 23 05/03/2023    Lab Results  Component Value Date   CHOL 76 (L) 01/11/2023   HDL 23 (L) 01/11/2023   LDLCALC 15 01/11/2023   TRIG 255 (H) 01/11/2023   CHOLHDL 3.3 01/11/2023    Medications Reviewed Today     Reviewed by Carnell Christian, RPH (Pharmacist) on 08/23/23 at 1423  Med List Status: <None>   Medication Order Taking? Sig Documenting Provider Last Dose Status Informant  acetaminophen  (TYLENOL ) 500 MG tablet [id number] Yes Take 1,000 mg by mouth every 8 (eight) hours as needed for moderate pain (pain score 4-6). [provider] Taking Active Self, Pharmacy Records           Med Note Baruch Likens   UKG Mar 17, 2023  2:52 PM)    albuterol  (VENTOLIN  HFA) 108 [phone] Base) MCG/ACT inhaler [id number] Yes Inhale 2 puffs into the lungs every 4 (four) hours as needed. For breathing. Early, Sara E, NP Taking Active   aspirin  EC 81 MG tablet [id number] Yes Take 1 tablet (81 mg total) by mouth daily. Early, Sara E, NP Taking Active   BD PEN NEEDLE NANO 2ND GEN 32G X 4 MM MISC [id number]  USE DAILY WITH INSULIN  AS DIRECTED Early, Adriane Albe, NP  Active   Budeson-Glycopyrrol-Formoterol  (BREZTRI AEROSPHERE) 160-9-4.8 MCG/ACT AERO [id number] Yes Inhale into the lungs. 2 puffs BID - AZ&Me PAP [provider] Taking Active  Continuous Glucose Receiver (FREESTYLE LIBRE 3 READER) DEVI [id number]  Use with freestyle libre 3 sensor Tysinger, Christiane Cowing, PA-C  Active Self, Pharmacy Records  Continuous Glucose Sensor (FREESTYLE LIBRE 3 SENSOR) [place] [id number] Yes APPLY 1 SENSOR ON THE SKIN EVERY 14 DAYS, USE TO CHECK BLOOD GLUCOSE CONTINUOUSLY Early, Adriane Albe, NP Taking Active   empagliflozin  (JARDIANCE ) 10 MG TABS tablet [id number]  Take 1  tablet (10 mg total) by mouth daily before breakfast. Tysinger, Christiane Cowing, PA-C  Active   gabapentin  (NEURONTIN ) 300 MG capsule [id number]  Take 2 capsules (600 mg total) by mouth 3 (three) times daily. For back and foot pain. Early, Sara E, NP  Active            Med Note Carnell Christian   Thu Aug 23, 2023  1:59 PM) 2 times twice a day  glucose blood (ONETOUCH VERIO) test strip [id number]  Use to check blood glucose 3 times daily   Active Self, Pharmacy Records  HYDROcodone -acetaminophen  (NORCO/VICODIN) 5-325 MG tablet [id number] Yes Take 1-2 tablets every morning, 1 tablet at noon and 1 tablet at bedtime (DNF 07/01/23)  Taking Active   HYDROcodone -acetaminophen  (NORCO/VICODIN) 5-325 MG tablet [id number]  Take 1-2 tablets by mouth every morning AND 1 tablet daily at 12 noon AND 1 tablet at bedtime.   Active   HYDROcodone -acetaminophen  (NORCO/VICODIN) 5-325 MG tablet [id number]  Take 1-2 tablets by mouth every morning AND 1 tablet daily at 12 noon AND 1 tablet at bedtime.   Active   HYDROcodone -acetaminophen  (NORCO/VICODIN) 5-325 MG tablet [id number]  Take 1-2 tablets by mouth every morning AND 1 tablet daily at 12 noon AND 1 tablet at bedtime.   Active   insulin  aspart (NOVOLOG ) 100 UNIT/ML injection [id number]  Inject 0-9 Units into the skin 3 (three) times daily before meals. Sliding scale CBG 70-120 0 UNITS CBG 121-150 1 UNIT CBG 151-200 2 UNITS CGB 201-250 3 UNITS CGB 251-300 5 UNITS CGB 301-350 7 UNITS CGB 351-400 9 UNITS HIGHER TANH 400 CALL DOCTOR Early, Adriane Albe, NP  Active   insulin  glargine, 1 Unit Dial , (TOUJEO  SOLOSTAR) 300 UNIT/ML Solostar Pen [id number] Yes Inject 10 Units into the skin daily at 6 (six) AM. Early, Adriane Albe, NP Taking Active   metFORMIN  (GLUCOPHAGE -XR) 500 MG 24 hr tablet [id number] Yes Take 2 tablets (1,000 mg total) by mouth 2 (two) times daily. Early, Sara E, NP Taking Active   ondansetron  (ZOFRAN ) 4 MG tablet [id number]  Take 1 tablet (4 mg total) by mouth every 8 (eight) hours as  needed for nausea or vomiting. Early, Sara E, NP  Active   pantoprazole  (PROTONIX ) 40 MG tablet [id number] Yes Take 1 tablet (40 mg total) by mouth daily. Early, Sara E, NP Taking Active   rosuvastatin  (CRESTOR ) 20 MG tablet [id number] Yes Take 1 tablet (20 mg total) by mouth daily. Early, Sara E, NP Taking Active   Testosterone  20.25 MG/ACT (1.62%) GEL [id number] Yes Place 2 Applications onto the skin daily. Tysinger, Christiane Cowing, PA-C Taking Active   valsartan  (DIOVAN ) 80 MG tablet [id number] Yes Take 1 tablet (80 mg total) by mouth daily. [place], [name], MD Taking Active   zolpidem  (AMBIEN ) 10 MG tablet [id number] Yes Take 1 tablet (10 mg total) by mouth at bedtime as needed for sleep.  Taking Active               Assessment/Plan:   Diabetes: - Currently uncontrolled but improving - Reviewed long term cardiovascular and renal outcomes of  uncontrolled blood sugar - Reviewed goal A1c, goal fasting, and goal 2 hour post prandial glucose - Reviewed dietary modifications including low carb diet: -Continue current medication regimen as just started Jardiance .  -Counseled on need to limit carb intake and that excessively high sugars will worsen the need to urinate frequently.  -Counseled on importance of adherence to Novolog  and tips for remembering missed doses -Future consideration: Dose increase jardiance , may need to increase toujeo  as well if sugars continue to elevate    Follow Up Plan: 1 week  Carnell Christian, PharmD Clinical Pharmacist [phone]

## 2023-08-24 ENCOUNTER — Other Ambulatory Visit: Payer: Self-pay | Admitting: Nurse Practitioner

## 2023-08-24 DIAGNOSIS — G4700 Insomnia, unspecified: Secondary | ICD-10-CM

## 2023-08-24 DIAGNOSIS — M6281 Muscle weakness (generalized): Secondary | ICD-10-CM | POA: Diagnosis not present

## 2023-08-24 DIAGNOSIS — M5459 Other low back pain: Secondary | ICD-10-CM | POA: Diagnosis not present

## 2023-08-24 DIAGNOSIS — R296 Repeated falls: Secondary | ICD-10-CM | POA: Diagnosis not present

## 2023-08-24 DIAGNOSIS — R262 Difficulty in walking, not elsewhere classified: Secondary | ICD-10-CM | POA: Diagnosis not present

## 2023-08-24 MED ORDER — ZOLPIDEM TARTRATE 10 MG PO TABS: 10.0000 mg | ORAL_TABLET | Freq: Every evening | ORAL | 2 refills | Status: DC | PRN

## 2023-08-27 ENCOUNTER — Telehealth: Payer: Self-pay

## 2023-08-27 DIAGNOSIS — R296 Repeated falls: Secondary | ICD-10-CM | POA: Diagnosis not present

## 2023-08-27 DIAGNOSIS — R262 Difficulty in walking, not elsewhere classified: Secondary | ICD-10-CM | POA: Diagnosis not present

## 2023-08-27 DIAGNOSIS — M6281 Muscle weakness (generalized): Secondary | ICD-10-CM | POA: Diagnosis not present

## 2023-08-27 DIAGNOSIS — M5459 Other low back pain: Secondary | ICD-10-CM | POA: Diagnosis not present

## 2023-08-27 NOTE — Telephone Encounter (Signed)
 PAP: Patient assistance application for Jardiance  has been approved by PAP Companies: [employer] from 08/24/2023 to 04/16/2024. Medication should be delivered to PAP Delivery: Home. For further shipping updates, please contact [employer] [facility]) at [phone]. Patient ID is: [id number]

## 2023-08-27 NOTE — Telephone Encounter (Signed)
 Do you have a PA for the pts. Anoro Ellipta  Inhaler?  Copied from CRM [id number]. Topic: Clinical - Prescription Issue >> Aug 27, 2023  4:14 PM Kevelyn M wrote: Reason for CRM: [facility] calling in regards to Anoro Ellipta  Inhaler. The patient's plan is suguest that he uses Stiolto inhaler or Beve inhaler. Please advise [phone]/ref#[id number].

## 2023-08-27 NOTE — Progress Notes (Signed)
 Pharmacy Medication Assistance Program Note    08/27/2023  Patient ID: Paul [name], male   DOB: 1950-08-05, 73 y.o.   MRN: [id number]     05/21/2023 08/27/2023  Outreach Medication One  Manufacturer Medication One [employer] [facility]  Jardiance   Dose of Jardiance   10 mg  [facility] Ozempic   Dose of Ozempic 0.5MG    Type of Proofreader  Patient Assistance Determination Approved Approved  Approval Start Date 05/13/2023 08/24/2023  Approval End Date 05/12/2024 04/16/2024  Patient Notification Method  Telephone Call     Signature

## 2023-08-28 ENCOUNTER — Other Ambulatory Visit (HOSPITAL_COMMUNITY): Payer: Self-pay

## 2023-08-28 ENCOUNTER — Ambulatory Visit: Admitting: Dermatology

## 2023-08-28 ENCOUNTER — Encounter: Payer: Self-pay | Admitting: Dermatology

## 2023-08-28 DIAGNOSIS — C4492 Squamous cell carcinoma of skin, unspecified: Secondary | ICD-10-CM

## 2023-08-28 DIAGNOSIS — Z85828 Personal history of other malignant neoplasm of skin: Secondary | ICD-10-CM

## 2023-08-28 DIAGNOSIS — G8918 Other acute postprocedural pain: Secondary | ICD-10-CM | POA: Diagnosis not present

## 2023-08-28 DIAGNOSIS — L539 Erythematous condition, unspecified: Secondary | ICD-10-CM | POA: Diagnosis not present

## 2023-08-28 DIAGNOSIS — L905 Scar conditions and fibrosis of skin: Secondary | ICD-10-CM

## 2023-08-28 NOTE — Progress Notes (Signed)
   Follow Up Visit   Subjective  Paul [name] is a 73 y.o. male who presents for the following: follow up from Mohs surgery   The patient presents for follow up from Mohs surgery for a SCC on the left dorsal hand, treated on 05/01/23, repaired with FTSG. The patient has been bandaging the wound as directed. The endorse the following concerns: mild chronic pain.  The following portions of the chart were reviewed this encounter and updated as appropriate: medications, allergies, medical history  Review of Systems:  No other skin or systemic complaints except as noted in HPI or Assessment and Plan.  Objective  Well appearing patient in no apparent distress; mood and affect are within normal limits.  A full examination was performed including scalp, head, face and left hand All findings within normal limits unless otherwise noted below.  Healing wound with mild erythema  Relevant physical exam findings are noted in the Assessment and Plan.     Assessment & Plan    Healing s/p Mohs for SCC, treated on left dorsal hand, repaired with FTSG - Reassured that wound is healing well - Discussed that scars take up to 12 months to mature from the date of surgery - Recommend SPF 30+ to scar daily to prevent purple color from UV exposure during scar maturation process - Discussed that erythema and raised appearance of scar will fade over the next 4-6 months  HISTORY OF SQUAMOUS CELL CARCINOMA OF THE SKIN - No evidence of recurrence today - No lymphadenopathy - Recommend regular full body skin exams - Recommend daily broad spectrum sunscreen SPF 30+ to sun-exposed areas, reapply every 2 hours as needed.  - Call if any new or changing lesions are noted between office visits  Postoperative Soreness- Discussed that nerves can take many month to return to normal function after surgery Discussed with pt using 4% lidocaine  cream for wound site  Return in 3 months (on 11/28/2023) for UBSE.  I,  Wilson Hasten, CMA, am acting as scribe for Deneise Finlay, MD.   Documentation: I have reviewed the above documentation for accuracy and completeness, and I agree with the above.  Deneise Finlay, MD

## 2023-08-28 NOTE — Patient Instructions (Signed)

## 2023-08-29 ENCOUNTER — Telehealth: Payer: Self-pay | Admitting: Internal Medicine

## 2023-08-29 DIAGNOSIS — M6281 Muscle weakness (generalized): Secondary | ICD-10-CM | POA: Diagnosis not present

## 2023-08-29 DIAGNOSIS — R262 Difficulty in walking, not elsewhere classified: Secondary | ICD-10-CM | POA: Diagnosis not present

## 2023-08-29 DIAGNOSIS — M5459 Other low back pain: Secondary | ICD-10-CM | POA: Diagnosis not present

## 2023-08-29 DIAGNOSIS — R296 Repeated falls: Secondary | ICD-10-CM | POA: Diagnosis not present

## 2023-08-29 NOTE — Telephone Encounter (Unsigned)
 Copied from CRM [id number]. Topic: Clinical - Prescription Issue >> Aug 29, 2023  3:30 PM Santiya F wrote: Reason for CRM: [facility] is calling in because they received a script for patient and the insurance isn't covering it. [facility] says insurance will cover stiolto respimat  inhaler or the bevespi  aerosphere 10.7 gram. They are requesting an e-script be sent over or they can accept verbal permission as well. Please follow up with [facility]. Ref #: [id number]

## 2023-08-30 ENCOUNTER — Telehealth: Payer: Self-pay

## 2023-08-30 ENCOUNTER — Other Ambulatory Visit (HOSPITAL_COMMUNITY): Payer: Self-pay

## 2023-08-30 ENCOUNTER — Other Ambulatory Visit (INDEPENDENT_AMBULATORY_CARE_PROVIDER_SITE_OTHER)

## 2023-08-30 ENCOUNTER — Other Ambulatory Visit: Payer: Self-pay | Admitting: Medical

## 2023-08-30 DIAGNOSIS — E118 Type 2 diabetes mellitus with unspecified complications: Secondary | ICD-10-CM

## 2023-08-30 MED ORDER — TRELEGY ELLIPTA 200-62.5-25 MCG/ACT IN AEPB: 1.0000 | INHALATION_SPRAY | Freq: Every day | RESPIRATORY_TRACT | 1 refills | Status: DC

## 2023-08-30 NOTE — Telephone Encounter (Signed)
 Pt was notified.

## 2023-08-30 NOTE — Telephone Encounter (Signed)
   However pt is getting Breztri via PAP/medication assiatance Per [employer].  Nothing further needed

## 2023-08-30 NOTE — Progress Notes (Signed)
 08/30/2023 Name: Paul [name] MRN: [id number] DOB: [birth year]  Chief Complaint  Patient presents with   Medication Management   Diabetes    Paul [name] is a 73 y.o. year old male who presented for a telephone visit.   They were referred to the pharmacist by their PCP for assistance in managing complex medication management.    Subjective:  Care Team: Primary Care Provider: Annella Kief, NP ; Next Scheduled Visit: 09/20/23  Medication Access/Adherence  Current Pharmacy:  [facility] [address] [place] [address] Phone: [phone] Fax: [phone]  [facility] - [place], [place] - [address] [place] [address] Phone: [phone] Fax: [phone]   Patient reports affordability concerns with their medications: No  Patient reports access/transportation concerns to their pharmacy: No  Patient reports adherence concerns with their medications:  No     Diabetes:  Current medications: Novolog  ss, Toujeo  10 units, Metformin  XR 1000mg  BID, Jardiance  10mg  Medications tried in the past: Glipizide, Ozempic (N/V), Farxiga (frequent nighttime urination)  Current glucose readings:    Patient denies hypoglycemic s/sx including dizziness, shakiness, sweating. Patient denies hyperglycemic symptoms including polyuria, polydipsia, polyphagia, nocturia, neuropathy, blurred vision   Patient's sugars were responding well to ozempic, no more lows. However, patient has stopped ozempic due to nausea and vomiting despite zofran . Does not wish to continue taking.  Stopped farxiga due to frequent urination at night, taking Jardiance  10mg , reports he feels better on this medication.  PAP jardiance  approved 08/27/23.  Admits to missing doses of his Novolog , which explains sugar elevations around typical meal times. Also will miss toujeo  on rare occasion, says he has once or twice in the  last 2-3 weeks   Objective:  Lab Results  Component Value Date   HGBA1C 8.7 (H) 05/03/2023    Lab Results  Component Value Date   CREATININE 1.16 05/03/2023   BUN 16 05/03/2023   NA 137 05/03/2023   K 5.1 05/03/2023   CL 102 05/03/2023   CO2 23 05/03/2023    Lab Results  Component Value Date   CHOL 76 (L) 01/11/2023   HDL 23 (L) 01/11/2023   LDLCALC 15 01/11/2023   TRIG 255 (H) 01/11/2023   CHOLHDL 3.3 01/11/2023    Medications Reviewed Today     Reviewed by Carnell Christian, RPH (Pharmacist) on 08/30/23 at 1604  Med List Status: <None>   Medication Order Taking? Sig Documenting Provider Last Dose Status Informant  acetaminophen  (TYLENOL ) 500 MG tablet [id number]  Take 1,000 mg by mouth every 8 (eight) hours as needed for moderate pain (pain score 4-6). [provider]  Active Self, Pharmacy Records           Med Note Baruch Likens   DGL Mar 17, 2023  2:52 PM)    albuterol  (VENTOLIN  HFA) 108 [phone] Base) MCG/ACT inhaler [id number]  Inhale 2 puffs into the lungs every 4 (four) hours as needed. For breathing. Early, Sara E, NP  Active   aspirin  EC 81 MG tablet [id number]  Take 1 tablet (81 mg total) by mouth daily. Early, Sara E, NP  Active   BD PEN NEEDLE NANO 2ND GEN 32G X 4 MM MISC [id number]  USE DAILY WITH INSULIN  AS DIRECTED Early, Sara E, NP  Active   Budeson-Glycopyrrol-Formoterol  (BREZTRI AEROSPHERE) 160-9-4.8 MCG/ACT Sudie Ely [id number]  Inhale into the lungs. 2 puffs BID - AZ&Me PAP [provider]  Active   Continuous Glucose Receiver (FREESTYLE LIBRE 3 READER) DEVI [id number]  Use with freestyle libre 3 sensor Tysinger, Christiane Cowing, PA-C  Active Self, Pharmacy Records  Continuous Glucose Sensor (FREESTYLE LIBRE 3 SENSOR) MISC [id number]  APPLY 1 SENSOR ON THE SKIN EVERY 14 DAYS, USE TO CHECK BLOOD GLUCOSE CONTINUOUSLY Early, Sara E, NP  Active   empagliflozin  (JARDIANCE ) 10 MG TABS tablet [id number] Yes Take 1 tablet (10 mg total) by mouth daily before  breakfast. Claudene Crystal, PA-C Taking Active     Discontinued 08/30/23 1603 (Change in therapy)   gabapentin  (NEURONTIN ) 300 MG capsule [id number]  Take 2 capsules (600 mg total) by mouth 3 (three) times daily. For back and foot pain. Early, Sara E, NP  Active            Med Note Carnell Christian   Thu Aug 23, 2023  1:59 PM) 2 times twice a day  glucose blood (ONETOUCH VERIO) test strip [id number]  Use to check blood glucose 3 times daily   Active Self, Pharmacy Records  HYDROcodone -acetaminophen  (NORCO/VICODIN) 5-325 MG tablet [id number]  Take 1-2 tablets every morning, 1 tablet at noon and 1 tablet at bedtime (DNF 07/01/23)   Active   HYDROcodone -acetaminophen  (NORCO/VICODIN) 5-325 MG tablet [id number]  Take 1-2 tablets by mouth every morning AND 1 tablet daily at 12 noon AND 1 tablet at bedtime.   Active   HYDROcodone -acetaminophen  (NORCO/VICODIN) 5-325 MG tablet [id number]  Take 1-2 tablets by mouth every morning AND 1 tablet daily at 12 noon AND 1 tablet at bedtime.   Active   HYDROcodone -acetaminophen  (NORCO/VICODIN) 5-325 MG tablet [id number]  Take 1-2 tablets by mouth every morning AND 1 tablet daily at 12 noon AND 1 tablet at bedtime.   Active   insulin  aspart (NOVOLOG ) 100 UNIT/ML injection [id number]  Inject 0-9 Units into the skin 3 (three) times daily before meals. Sliding scale CBG 70-120 0 UNITS CBG 121-150 1 UNIT CBG 151-200 2 UNITS CGB 201-250 3 UNITS CGB 251-300 5 UNITS CGB 301-350 7 UNITS CGB 351-400 9 UNITS HIGHER TANH 400 CALL DOCTOR Early, Adriane Albe, NP  Active   insulin  glargine, 1 Unit Dial , (TOUJEO  SOLOSTAR) 300 UNIT/ML Solostar Pen [id number]  Inject 10 Units into the skin daily at 6 (six) AM. Early, Adriane Albe, NP  Active   metFORMIN  (GLUCOPHAGE -XR) 500 MG 24 hr tablet [id number]  Take 2 tablets (1,000 mg total) by mouth 2 (two) times daily. Early, Sara E, NP  Active   ondansetron  (ZOFRAN ) 4 MG tablet [id number]  Take 1 tablet (4 mg total) by mouth every 8 (eight) hours as needed for  nausea or vomiting. Early, Sara E, NP  Active   pantoprazole  (PROTONIX ) 40 MG tablet [id number]  Take 1 tablet (40 mg total) by mouth daily. Early, Sara E, NP  Active   rosuvastatin  (CRESTOR ) 20 MG tablet [id number]  Take 1 tablet (20 mg total) by mouth daily. Early, Sara E, NP  Active   Testosterone  20.25 MG/ACT (1.62%) GEL [id number]  Place 2 Applications onto the skin daily. Tysinger, Christiane Cowing, PA-C  Active   valsartan  (DIOVAN ) 80 MG tablet [id number]  Take 1 tablet (80 mg total) by mouth daily. [place], [name], MD  Active   zolpidem  (AMBIEN ) 10 MG tablet [id number]  Take 1 tablet (10 mg total) by mouth at bedtime as needed for sleep. Early, Sara E, NP  Active               Assessment/Plan:  Diabetes: - Currently uncontrolled but improving - Reviewed long term cardiovascular and renal outcomes of uncontrolled blood sugar - Reviewed goal A1c, goal fasting, and goal 2 hour post prandial glucose - Reviewed dietary modifications including low carb diet: -Continue current medication regimen. -Counseled on need to limit carb intake and that excessively high sugars will worsen the need to urinate frequently.  -Counseled on importance of adherence to Novolog  and tips for remembering missed doses -Future consideration: Dose increase jardiance , may need to increase toujeo  as well if sugars continue to elevate    Follow Up Plan: 10/04/23 (sees PCP 09/20/23)  Carnell Christian, PharmD Clinical Pharmacist [phone]

## 2023-08-30 NOTE — Telephone Encounter (Addendum)
     However per [facility] Paul [name]. Pt is getting Breztri via PAP/medication assistance

## 2023-08-31 ENCOUNTER — Other Ambulatory Visit

## 2023-08-31 DIAGNOSIS — E538 Deficiency of other specified B group vitamins: Secondary | ICD-10-CM

## 2023-08-31 MED ORDER — CYANOCOBALAMIN 1000 MCG/ML IJ SOLN
1000.0000 ug | Freq: Once | INTRAMUSCULAR | Status: AC
  Administered 2023-08-31: 1000 ug via INTRAMUSCULAR

## 2023-09-05 DIAGNOSIS — M6281 Muscle weakness (generalized): Secondary | ICD-10-CM | POA: Diagnosis not present

## 2023-09-05 DIAGNOSIS — R296 Repeated falls: Secondary | ICD-10-CM | POA: Diagnosis not present

## 2023-09-05 DIAGNOSIS — M5459 Other low back pain: Secondary | ICD-10-CM | POA: Diagnosis not present

## 2023-09-05 DIAGNOSIS — R262 Difficulty in walking, not elsewhere classified: Secondary | ICD-10-CM | POA: Diagnosis not present

## 2023-09-05 NOTE — Telephone Encounter (Signed)
 PAP Ozempic received, left message for pt

## 2023-09-07 DIAGNOSIS — M5459 Other low back pain: Secondary | ICD-10-CM | POA: Diagnosis not present

## 2023-09-07 DIAGNOSIS — R296 Repeated falls: Secondary | ICD-10-CM | POA: Diagnosis not present

## 2023-09-07 DIAGNOSIS — R262 Difficulty in walking, not elsewhere classified: Secondary | ICD-10-CM | POA: Diagnosis not present

## 2023-09-07 DIAGNOSIS — M6281 Muscle weakness (generalized): Secondary | ICD-10-CM | POA: Diagnosis not present

## 2023-09-12 ENCOUNTER — Other Ambulatory Visit: Payer: Self-pay | Admitting: Nurse Practitioner

## 2023-09-14 ENCOUNTER — Ambulatory Visit: Payer: Self-pay

## 2023-09-14 NOTE — Telephone Encounter (Signed)
  Chief Complaint: medication clarification Additional Notes: pt calling to clarify medications. Pt has been prescribed empagliflozin  (JARDIANCE ) 10 MG TABS tablet [[id number]]  and Farxiga 10mg . Triager reviewed most recent OV notes from D. Tysinger on 07/17/2023. Patient verbalized understanding.    Copied from CRM [id number]. Topic: Clinical - Medication Question >> Sep 14, 2023  4:49 PM Leory Rands wrote: Reason for CRM: Patient is calling to ask which medication should he take  Rx #: [id number]  empagliflozin  (JARDIANCE ) 10 MG TABS tablet [[id number]]  or Farxiga 10mg ? Reason for Disposition  Caller has medicine question only, adult not sick, AND triager answers question  Answer Assessment - Initial Assessment Questions 1. NAME of MEDICINE: "What medicine(s) are you calling about?"     empagliflozin  (JARDIANCE ) 10 MG TABS tablet [[id number]]  or Farxiga 10mg  2. QUESTION: "What is your question?" (e.g., double dose of medicine, side effect)     Not sure which one to take 3. PRESCRIBER: "Who prescribed the medicine?" Reason: if prescribed by specialist, call should be referred to that group.     S. Early & D. Tysinger 4. SYMPTOMS: "Do you have any symptoms?" If Yes, ask: "What symptoms are you having?"  "How bad are the symptoms (e.g., mild, moderate, severe)     Frequent urination 5. PREGNANCY:  "Is there any chance that you are pregnant?" "When was your last menstrual period?"     N/a  Protocols used: Medication Question Call-A-AH

## 2023-09-18 ENCOUNTER — Other Ambulatory Visit (HOSPITAL_COMMUNITY): Payer: Self-pay

## 2023-09-18 MED ORDER — HYDROCODONE-ACETAMINOPHEN 5-325 MG PO TABS
1.0000 | ORAL_TABLET | Freq: Four times a day (QID) | ORAL | 0 refills | Status: DC
  Filled 2023-11-27: qty 120, 30d supply, fill #0

## 2023-09-18 MED ORDER — HYDROCODONE-ACETAMINOPHEN 5-325 MG PO TABS
1.0000 | ORAL_TABLET | Freq: Four times a day (QID) | ORAL | 0 refills | Status: DC
  Filled 2023-10-26: qty 120, 30d supply, fill #0
  Filled ????-??-??: fill #0

## 2023-09-18 NOTE — Telephone Encounter (Signed)
 Please let him know that he should be taking JARDIANCE . This is in place of [place]. He should discontinue the Farxiga.

## 2023-09-18 NOTE — Telephone Encounter (Signed)
 Left message for patient to let him know to take the Jardiance  and discontinue to Farxiga.

## 2023-09-19 DIAGNOSIS — M6281 Muscle weakness (generalized): Secondary | ICD-10-CM | POA: Diagnosis not present

## 2023-09-19 DIAGNOSIS — M5459 Other low back pain: Secondary | ICD-10-CM | POA: Diagnosis not present

## 2023-09-19 DIAGNOSIS — R262 Difficulty in walking, not elsewhere classified: Secondary | ICD-10-CM | POA: Diagnosis not present

## 2023-09-19 DIAGNOSIS — R296 Repeated falls: Secondary | ICD-10-CM | POA: Diagnosis not present

## 2023-09-20 ENCOUNTER — Ambulatory Visit: Admitting: Nurse Practitioner

## 2023-09-20 ENCOUNTER — Encounter: Payer: Self-pay | Admitting: Nurse Practitioner

## 2023-09-20 VITALS — BP 108/70 | HR 68 | Wt 177.2 lb

## 2023-09-20 DIAGNOSIS — G8929 Other chronic pain: Secondary | ICD-10-CM

## 2023-09-20 DIAGNOSIS — M25551 Pain in right hip: Secondary | ICD-10-CM | POA: Diagnosis not present

## 2023-09-20 DIAGNOSIS — E118 Type 2 diabetes mellitus with unspecified complications: Secondary | ICD-10-CM

## 2023-09-20 DIAGNOSIS — M25511 Pain in right shoulder: Secondary | ICD-10-CM | POA: Diagnosis not present

## 2023-09-20 DIAGNOSIS — M25512 Pain in left shoulder: Secondary | ICD-10-CM | POA: Diagnosis not present

## 2023-09-20 DIAGNOSIS — E1142 Type 2 diabetes mellitus with diabetic polyneuropathy: Secondary | ICD-10-CM

## 2023-09-20 DIAGNOSIS — E291 Testicular hypofunction: Secondary | ICD-10-CM | POA: Diagnosis not present

## 2023-09-20 NOTE — Patient Instructions (Addendum)
 MEDICATION  Take 2 300mg  gabapentin  in the morning Take 3 300mg  gabapentin  at bedtime  You can take gabapentin  100mg  in the afternoon if you need it     I will send in the new dose of testosterone  as soon as we get the labs back  I will place the orders MRI I will see if we can get insurance to cover the muscle tape you would like to try

## 2023-09-20 NOTE — Progress Notes (Signed)
 Dell Fennel, DNP, AGNP-c [facility]  [address] [place], [place] [address] [phone]  ESTABLISHED PATIENT- Chronic Health and/or Follow-Up Visit  Blood pressure 108/70, pulse 68, weight 177 lb 3.2 oz (80.4 kg).    History of Present Illness Paul [name] is a 73 year old male with degenerative joint disease and diabetes who presents with worsening hip and shoulder pain.  He experiences worsening pain in both shoulders, with the left shoulder previously being more affected but now the right shoulder is also involved. The pain is severe, especially when trying to hold onto objects. He recalls being informed by a previous provider that his shoulder was 'bone to bone' and was recommended surgery, but he is apprehensive about undergoing the procedure.  He also experiences significant hip pain, primarily on the right side, which has been ongoing for three to five years. The pain is exacerbated by physical activity, and he relies heavily on a cane for mobility. He mentions that a friend with similar symptoms was diagnosed with a hamstring issue that was only visible on an MRI.  He has a history of diabetes and is currently managing his condition with Jardiance  10 mg and a quick-acting insulin  shot. He monitors his blood sugar levels regularly, noting fluctuations, particularly after consuming sugary foods. He has stopped using Ozempic due to adverse effects.  He is on gabapentin  for nerve pain, taking 300 mg twice daily and 100 mg in the middle of the day. He reports numbness and pain in his hands at night, which he attributes to nerve issues related to his shoulder condition. He also experiences tingling in his feet upon waking, which affects his mobility.  His past medical history includes a previous neck surgery for spinal issues, and he has been informed of degenerative changes in his lower back, which may be contributing to his current symptoms. His family history  includes a brother with similar musculoskeletal issues.  MRI right hip KT tape for shoulders  All ROS negative with exception of what is listed above.   PHYSICAL EXAM Physical Exam Vitals and nursing note reviewed.  Constitutional:      General: He is not in acute distress.  Eyes:     Conjunctiva/sclera: Conjunctivae normal.    Cardiovascular:     Rate and Rhythm: Normal rate and regular rhythm.     Pulses: Normal pulses.     Heart sounds: Normal heart sounds.  Pulmonary:     Effort: Pulmonary effort is normal.     Breath sounds: Rhonchi present.  Abdominal:     Palpations: Abdomen is soft.   Musculoskeletal:        General: Tenderness present.   Skin:    General: Skin is warm and dry.     Capillary Refill: Capillary refill takes less than 2 seconds.   Neurological:     Mental Status: He is alert and oriented to person, place, and time.     Sensory: No sensory deficit.     Motor: Weakness present.     Coordination: Coordination abnormal.     Gait: Gait abnormal.   Psychiatric:        Mood and Affect: Mood normal.      PLAN Problem List Items Addressed This Visit     DM (diabetes mellitus), type 2 with complications (HCC)   Not on Ozempic due to adverse effects; managing diabetes with other medications, including Jardiance . Blood sugar levels fluctuate but generally remain under 200 mg/dL. Dietary changes, such as sugar-free  tea, implemented to manage blood sugar. - Continue current diabetes management regimen - Monitor blood sugar levels regularly      Relevant Orders   Hemoglobin A1c (Completed)   Chronic pain of both shoulders   Bilateral shoulder pain with left shoulder previously diagnosed as bone-on-bone and right shoulder approaching similar condition. He declined surgery due to fear. KT tape discussed for tendon or ligament support, previously helpful for other areas. Muscle support may alleviate discomfort but not bone-on-bone issue. - Order MRI to  assess shoulder condition - Check insurance coverage for KT tape      Diabetic polyneuropathy associated with type 2 diabetes mellitus (HCC)   Numbness and pain in hands, especially at night, likely due to nerve compression from shoulder osteoarthritis. Currently on gabapentin  300 mg twice daily and 100 mg three times daily. Discussed increasing gabapentin  to 600 mg three times daily for better symptom management. - Increase gabapentin  to 600 mg three times daily - Use existing supply of 300 mg tablets until depleted      Right hip pain   Right-sided hip and lower back pain ongoing for several years. Patient concerned with possible hamstring issue, however, given length of time present we discussed this is unlikely. Most likely pinched nerve in lower back. Degenerative changes in lower back with bone-on-bone contact likely affecting nerves. MRI planned to investigate pain cause. - Order MRI for hip and lower back - Evaluate MRI results to determine cause of pain       Testosterone  deficiency in male - Primary   On low dose testosterone  gel (1.65 mg) with no significant improvement in energy levels. Previous low testosterone  levels; current levels to be checked after one week off gel. Dose increase may be necessary due to low initial dose and persistent fatigue. - Check testosterone  levels after being off the gel for a week - Consider increasing testosterone  dose based on lab results      Relevant Orders   Testosterone , Free, Total, SHBG (Completed)   CBC with Differential/Platelet (Completed)   FSH/LH (Completed)    Return in about 3 months (around 12/21/2023) for Med Management 30.  Dell Fennel, DNP, AGNP-c

## 2023-09-21 LAB — HEMOGLOBIN A1C
Est. average glucose Bld gHb Est-mCnc: 186 mg/dL
Hgb A1c MFr Bld: 8.1 % — ABNORMAL HIGH (ref 4.8–5.6)

## 2023-09-22 LAB — TESTOSTERONE, FREE, TOTAL, SHBG
Sex Hormone Binding: 24.1 nmol/L (ref 19.3–76.4)
Testosterone, Free: 1 pg/mL — ABNORMAL LOW (ref 6.6–18.1)
Testosterone: 71 ng/dL — ABNORMAL LOW (ref 264–916)

## 2023-09-22 LAB — CBC WITH DIFFERENTIAL/PLATELET
Basophils Absolute: 0.1 10*3/uL (ref 0.0–0.2)
Basos: 1 %
EOS (ABSOLUTE): 0.2 10*3/uL (ref 0.0–0.4)
Eos: 3 %
Hematocrit: 46.8 % (ref 37.5–51.0)
Hemoglobin: 15 g/dL (ref 13.0–17.7)
Immature Grans (Abs): 0 10*3/uL (ref 0.0–0.1)
Immature Granulocytes: 0 %
Lymphocytes Absolute: 1.6 10*3/uL (ref 0.7–3.1)
Lymphs: 23 %
MCH: 29.3 pg (ref 26.6–33.0)
MCHC: 32.1 g/dL (ref 31.5–35.7)
MCV: 91 fL (ref 79–97)
Monocytes Absolute: 0.4 10*3/uL (ref 0.1–0.9)
Monocytes: 5 %
Neutrophils Absolute: 4.7 10*3/uL (ref 1.4–7.0)
Neutrophils: 68 %
Platelets: 128 10*3/uL — ABNORMAL LOW (ref 150–450)
RBC: 5.12 x10E6/uL (ref 4.14–5.80)
RDW: 13 % (ref 11.6–15.4)
WBC: 6.9 10*3/uL (ref 3.4–10.8)

## 2023-09-22 LAB — FSH/LH
FSH: 11 m[IU]/mL (ref 1.5–12.4)
LH: 6.7 m[IU]/mL (ref 1.7–8.6)

## 2023-09-24 DIAGNOSIS — R262 Difficulty in walking, not elsewhere classified: Secondary | ICD-10-CM | POA: Diagnosis not present

## 2023-09-24 DIAGNOSIS — M6281 Muscle weakness (generalized): Secondary | ICD-10-CM | POA: Diagnosis not present

## 2023-09-24 DIAGNOSIS — R296 Repeated falls: Secondary | ICD-10-CM | POA: Diagnosis not present

## 2023-09-24 DIAGNOSIS — M5459 Other low back pain: Secondary | ICD-10-CM | POA: Diagnosis not present

## 2023-09-25 ENCOUNTER — Telehealth: Payer: Self-pay

## 2023-09-25 DIAGNOSIS — Z794 Long term (current) use of insulin: Secondary | ICD-10-CM

## 2023-09-25 MED ORDER — FREESTYLE LIBRE 3 SENSOR MISC: 3 refills | Status: DC

## 2023-09-25 NOTE — Addendum Note (Signed)
 Addended by: Jacey Eckerson, Abraham Hoffmann E on: 09/25/2023 05:42 PM   Modules accepted: Orders

## 2023-09-25 NOTE — Telephone Encounter (Signed)
 Prescription Request: Freestyle Libre 2 sensor kit  14 day, quantity 2 refill 12 or Freestyle Libre 3 Plus sensor each 15 day, quantity 6 each [facility]

## 2023-09-26 ENCOUNTER — Telehealth: Payer: Self-pay

## 2023-09-26 ENCOUNTER — Ambulatory Visit: Payer: Self-pay | Admitting: Nurse Practitioner

## 2023-09-26 NOTE — Telephone Encounter (Signed)
 Called pt. Back and he is aware of his labs and wanted to continue on his Gel form of testosterone . Sent message to Jeffrie Minion to increase dosage.   Copied from CRM [id number]. Topic: Clinical - Medication Question >> Sep 26, 2023  4:21 PM Yolanda T wrote: Reason for CRM: patient called to find out if provider wanted to up the dosage on his Testosterone  20.25 MG/ACT (1.62%) GEL or if he would be getting a refill on the current dosage. Please advise patient

## 2023-09-27 ENCOUNTER — Other Ambulatory Visit: Payer: Self-pay

## 2023-09-27 ENCOUNTER — Other Ambulatory Visit (HOSPITAL_COMMUNITY): Payer: Self-pay

## 2023-09-27 ENCOUNTER — Telehealth: Payer: Self-pay

## 2023-09-27 DIAGNOSIS — G4733 Obstructive sleep apnea (adult) (pediatric): Secondary | ICD-10-CM

## 2023-09-27 MED ORDER — ALBUTEROL SULFATE HFA 108 (90 BASE) MCG/ACT IN AERS
2.0000 | INHALATION_SPRAY | RESPIRATORY_TRACT | 3 refills | Status: AC | PRN
Start: 2023-09-27 — End: ?

## 2023-09-27 NOTE — Telephone Encounter (Signed)
 Rx Request:  Ventolin  HFA Inhaler 90 mcg Freestyle Libre 3 sensor kit 14 day Zolpidem  Tartrate 10 mg tabs  [facility]

## 2023-09-28 ENCOUNTER — Other Ambulatory Visit (HOSPITAL_COMMUNITY): Payer: Self-pay

## 2023-09-28 ENCOUNTER — Ambulatory Visit: Admitting: Nurse Practitioner

## 2023-09-28 ENCOUNTER — Ambulatory Visit: Payer: Self-pay | Admitting: *Deleted

## 2023-09-28 VITALS — BP 108/60 | HR 73 | Temp 97.8°F | Wt 176.2 lb

## 2023-09-28 DIAGNOSIS — J449 Chronic obstructive pulmonary disease, unspecified: Secondary | ICD-10-CM | POA: Diagnosis not present

## 2023-09-28 DIAGNOSIS — E538 Deficiency of other specified B group vitamins: Secondary | ICD-10-CM | POA: Diagnosis not present

## 2023-09-28 DIAGNOSIS — J189 Pneumonia, unspecified organism: Secondary | ICD-10-CM

## 2023-09-28 MED ORDER — DM-GUAIFENESIN ER 30-600 MG PO TB12
ORAL_TABLET | ORAL | 0 refills | Status: DC
  Filled 2023-09-28: qty 30, fill #0

## 2023-09-28 MED ORDER — CYANOCOBALAMIN 1000 MCG/ML IJ SOLN
1000.0000 ug | Freq: Once | INTRAMUSCULAR | Status: AC
  Administered 2023-09-28: 1000 ug via INTRAMUSCULAR

## 2023-09-28 MED ORDER — LEVOFLOXACIN 500 MG PO TABS
500.0000 mg | ORAL_TABLET | Freq: Every day | ORAL | 0 refills | Status: AC
  Filled 2023-09-28: qty 7, 7d supply, fill #0

## 2023-09-28 MED ORDER — PROMETHAZINE-DM 6.25-15 MG/5ML PO SYRP
5.0000 mL | ORAL_SOLUTION | Freq: Every day | ORAL | 0 refills | Status: DC
  Filled 2023-09-28: qty 118, 23d supply, fill #0

## 2023-09-28 NOTE — Progress Notes (Unsigned)
 Annella Kief, DNP, AGNP-c [facility] [address] [place], [place] [address] [phone]   ACUTE VISIT- ESTABLISHED PATIENT  Blood pressure 108/60, pulse 73, temperature 97.8 F (36.6 C), weight 176 lb 3.2 oz (79.9 kg), SpO2 94%.  Subjective:  HPI History of Present Illness Paul [name] is a 73 year old male with COPD who presents with symptoms of pneumonia.  He has been feeling unwell for three days, experiencing a persistent cough that produces green mucus, rattling in the chest, shortness of breath, and a sore throat. The cough worsens at night, disrupting his sleep. He also experiences occasional blurry vision, which requires him to sit down until it resolves.  A particularly severe episode occurred the previous night, where he was unable to lie in bed due to coughing, leading him to sit on the couch and attempt to go to the bathroom, but the coughing persisted throughout the night. He describes the cough as painful, with discomfort localized to the right lung.  He denies fever but reports occasional breakout sweats. He feels generally unwell and fatigued, with a frequent need to rest. 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  His blood sugar was 165 mg/dL after breakfast but has since increased to 250 mg/dL, although he is unsure of the current level. He is using a Breztri inhaler but is uncertain about its effects. He drinks a lot of sweet tea but ensures he stays hydrated.  He has stopped taking Ozempic due to nausea and does not wish to continue it. He is missing some medications, possibly due to a neighbor filling his pill box.   ROS negative except for what is listed in HPI. History, Medications, Surgery, SDOH, and Family History reviewed and updated as appropriate.  Objective:  Physical Exam Vitals and nursing note reviewed.  Constitutional:       Appearance: Normal appearance.  HENT:     Head: Normocephalic.   Eyes:     Pupils: Pupils are equal, round, and reactive to light.    Cardiovascular:     Rate and Rhythm: Normal rate and regular rhythm.     Pulses: Normal pulses.     Heart sounds: Normal heart sounds.  Pulmonary:     Effort: Pulmonary effort is normal.     Breath sounds: Wheezing and rhonchi present.  Chest:     Chest wall: No tenderness.  Abdominal:     Palpations: Abdomen is soft.   Musculoskeletal:        General: Normal range of motion.     Cervical back: Normal range of motion.   Skin:    General: Skin is warm.     Capillary Refill: Capillary refill takes less than 2 seconds.   Neurological:     General: No focal deficit present.     Mental Status: He is alert and oriented to person, place, and time.   Psychiatric:        Mood and Affect: Mood normal.         Assessment & Plan:  Pneumonia Suspected walking pneumonia with symptoms of productive cough with green sputum, chest rattling, dyspnea, sore throat, and fatigue. Crackles auscultated in the right lung. Afebrile but reports occasional sweats. Symptoms began three days ago. Levofloxacin prescribed as it is the strongest outpatient antibiotic, targeting pneumonia in patients with pre-existing respiratory conditions. - Prescribe levofloxacin - Advise rest and elevation of feet -  Encourage increased fluid intake, especially water  - Prescribe cough syrup for nighttime use - Prescribe Mucinex  for daytime use - Follow up on Monday to assess progress  Chronic Obstructive Pulmonary Disease (COPD) COPD potentially related to asbestos exposure. Symptoms may be exacerbated by current pneumonia. Breztri inhaler prescribed for symptom management. - Ensure use of Breztri inhaler  Diabetes Mellitus Blood glucose elevated at 250 mg/dL, previously 161 mg/dL. Issues with diabetes management, including discontinuation of Ozempic due to nausea. Steroid  treatment considered but not initiated due to potential hyperglycemia. - Monitor blood glucose levels closely - Discuss diabetes management and medication adherence  Medication Management Medication adherence issues, including discontinuation of Ozempic due to nausea and potential mismanagement by a neighbor. Advised against neighbor involvement in medication management due to risks of missed doses or theft. - Advise against neighbor management of medications - Offer clinic assistance with medication management Problem List Items Addressed This Visit     B12 deficiency - Primary   COPD mixed type (HCC)   Relevant Medications   dextromethorphan -guaiFENesin  (MUCINEX  DM) 30-600 MG 12hr tablet   promethazine-dextromethorphan  (PROMETHAZINE-DM) 6.25-15 MG/5ML syrup   Community acquired pneumonia of right lower lobe of lung   Relevant Medications   levofloxacin (LEVAQUIN) 500 MG tablet   dextromethorphan -guaiFENesin  (MUCINEX  DM) 30-600 MG 12hr tablet   promethazine-dextromethorphan  (PROMETHAZINE-DM) 6.25-15 MG/5ML syrup      Annella Kief, DNP, AGNP-c

## 2023-09-28 NOTE — Patient Instructions (Signed)

## 2023-09-28 NOTE — Telephone Encounter (Signed)
 FYI Only or Action Required?: FYI only for provider  Patient was last seen in primary care on 09/20/2023 by Early, Adriane Albe, NP. Called Nurse Triage reporting Cough. Symptoms began several days ago. Interventions attempted: Nothing. Symptoms are: gradually worsening.  Triage Disposition: See Physician Within 24 Hours  Patient/caregiver understands and will follow disposition?: Yes- Call to CAL(Angie)- patient scheduled   Reason for Disposition  [1] Continuous (nonstop) coughing interferes with work or school AND [2] no improvement using cough treatment per Care Advice  Answer Assessment - Initial Assessment Questions 1. ONSET: When did the cough begin?      2 days 2. SEVERITY: How bad is the cough today?      Cough with rattle 3. SPUTUM: Describe the color of your sputum (none, dry cough; clear, white, yellow, green)     green 4. HEMOPTYSIS: Are you coughing up any blood? If so ask: How much? (flecks, streaks, tablespoons, etc.)     no 5. DIFFICULTY BREATHING: Are you having difficulty breathing? If Yes, ask: How bad is it? (e.g., mild, moderate, severe)    - MILD: No SOB at rest, mild SOB with walking, speaks normally in sentences, can lie down, no retractions, pulse < 100.    - MODERATE: SOB at rest, SOB with minimal exertion and prefers to sit, cannot lie down flat, speaks in phrases, mild retractions, audible wheezing, pulse 100-120.    - SEVERE: Very SOB at rest, speaks in single words, struggling to breathe, sitting hunched forward, retractions, pulse > 120      Woozy, dizzy with cough 6. FEVER: Do you have a fever? If Yes, ask: What is your temperature, how was it measured, and when did it start?     no  10. OTHER SYMPTOMS: Do you have any other symptoms? (e.g., runny nose, wheezing, chest pain)       Runny nose  Protocols used: Cough - Acute Productive-A-AH    Copied from CRM [id number]. Topic: Clinical - Red Word Triage >> Sep 28, 2023 10:16 AM [name]  wrote: Red Word that prompted transfer to Nurse Triage: Severe cough and hacking, onset a couple of days. Feeling woozy and dizzy when coughing. No fever noted. Coughing up green mucous.

## 2023-09-28 NOTE — Telephone Encounter (Signed)
 [facility]olipdem [facility]. 2 refills available.

## 2023-10-02 ENCOUNTER — Telehealth: Payer: Self-pay | Admitting: Nurse Practitioner

## 2023-10-02 NOTE — Assessment & Plan Note (Signed)
 On low dose testosterone  gel (1.65 mg) with no significant improvement in energy levels. Previous low testosterone  levels; current levels to be checked after one week off gel. Dose increase may be necessary due to low initial dose and persistent fatigue. - Check testosterone  levels after being off the gel for a week - Consider increasing testosterone  dose based on lab results

## 2023-10-02 NOTE — Assessment & Plan Note (Signed)
 Bilateral shoulder pain with left shoulder previously diagnosed as bone-on-bone and right shoulder approaching similar condition. He declined surgery due to fear. KT tape discussed for tendon or ligament support, previously helpful for other areas. Muscle support may alleviate discomfort but not bone-on-bone issue. - Order MRI to assess shoulder condition - Check insurance coverage for KT tape

## 2023-10-02 NOTE — Assessment & Plan Note (Signed)
 Numbness and pain in hands, especially at night, likely due to nerve compression from shoulder osteoarthritis. Currently on gabapentin  300 mg twice daily and 100 mg three times daily. Discussed increasing gabapentin  to 600 mg three times daily for better symptom management. - Increase gabapentin  to 600 mg three times daily - Use existing supply of 300 mg tablets until depleted

## 2023-10-02 NOTE — Assessment & Plan Note (Signed)
 Not on Ozempic due to adverse effects; managing diabetes with other medications, including Jardiance . Blood sugar levels fluctuate but generally remain under 200 mg/dL. Dietary changes, such as sugar-free tea, implemented to manage blood sugar. - Continue current diabetes management regimen - Monitor blood sugar levels regularly

## 2023-10-02 NOTE — Assessment & Plan Note (Addendum)
 Right-sided hip and lower back pain ongoing for several years. Patient concerned with possible hamstring issue, however, given length of time present we discussed this is unlikely. Most likely pinched nerve in lower back. Degenerative changes in lower back with bone-on-bone contact likely affecting nerves. MRI planned to investigate pain cause. - Order MRI for hip and lower back - Evaluate MRI results to determine cause of pain

## 2023-10-02 NOTE — Telephone Encounter (Signed)
 Fax from [facility]  Zolpidem  tartrate 10 mg  Freestyle Libre 3 sensor kit

## 2023-10-04 ENCOUNTER — Other Ambulatory Visit (HOSPITAL_COMMUNITY): Payer: Self-pay

## 2023-10-04 ENCOUNTER — Other Ambulatory Visit (INDEPENDENT_AMBULATORY_CARE_PROVIDER_SITE_OTHER)

## 2023-10-04 ENCOUNTER — Other Ambulatory Visit: Payer: Self-pay | Admitting: Nurse Practitioner

## 2023-10-04 ENCOUNTER — Other Ambulatory Visit

## 2023-10-04 DIAGNOSIS — Z794 Long term (current) use of insulin: Secondary | ICD-10-CM

## 2023-10-04 DIAGNOSIS — E1122 Type 2 diabetes mellitus with diabetic chronic kidney disease: Secondary | ICD-10-CM

## 2023-10-04 DIAGNOSIS — E291 Testicular hypofunction: Secondary | ICD-10-CM

## 2023-10-04 DIAGNOSIS — N1831 Chronic kidney disease, stage 3a: Secondary | ICD-10-CM

## 2023-10-04 MED ORDER — TESTOSTERONE 20.25 MG/ACT (1.62%) TD GEL
3.0000 | Freq: Every day | TRANSDERMAL | 0 refills | Status: DC
  Filled 2023-10-04: qty 75, 30d supply, fill #0

## 2023-10-04 NOTE — Progress Notes (Signed)
 10/04/2023 Name: Paul [name] MRN: [id number] DOB: 12-13-1950  Chief Complaint  Patient presents with   Medication Management   Diabetes    Paul [name] is a 73 y.o. year old male who presented for a telephone visit.   They were referred to the pharmacist by their PCP for assistance in managing complex medication management.    Subjective:  Care Team: Primary Care Provider: Annella Kief, NP ; Next Scheduled Visit: 11/06/23  Medication Access/Adherence  Current Pharmacy:  [facility] [address], [address] [place] [address] Phone: [phone] Fax: [phone]  [facility] - [place], [place] - [address] [place] [address] Phone: [phone] Fax: [phone]   Patient reports affordability concerns with their medications: No  Patient reports access/transportation concerns to their pharmacy: No  Patient reports adherence concerns with their medications:  No     Diabetes:  Current medications: Novolog  ss, Toujeo  10 units, Metformin  XR 1000mg  BID, Jardiance  10mg  Medications tried in the past: Glipizide, Ozempic (N/V), Farxiga (frequent nighttime urination)  Current glucose readings:    Patient denies hypoglycemic s/sx including dizziness, shakiness, sweating. Patient denies hyperglycemic symptoms including polyuria, polydipsia, polyphagia, nocturia, neuropathy, blurred vision   Patient's sugars were responding well to ozempic, no more lows. However, patient has stopped ozempic due to nausea and vomiting despite zofran . Does not wish to continue taking.  Stopped farxiga due to frequent urination at night, taking Jardiance  10mg , reports he feels better on this medication.  PAP jardiance  approved 08/27/23.  Reports in the last week, he has been better about remembering to take his Novolog  injections and sugars have improved   Objective:  Lab Results  Component Value Date    HGBA1C 8.1 (H) 09/20/2023    Lab Results  Component Value Date   CREATININE 1.16 05/03/2023   BUN 16 05/03/2023   NA 137 05/03/2023   K 5.1 05/03/2023   CL 102 05/03/2023   CO2 23 05/03/2023    Lab Results  Component Value Date   CHOL 76 (L) 01/11/2023   HDL 23 (L) 01/11/2023   LDLCALC 15 01/11/2023   TRIG 255 (H) 01/11/2023   CHOLHDL 3.3 01/11/2023    Medications Reviewed Today     Reviewed by Carnell Christian, RPH (Pharmacist) on 10/04/23 at 1402  Med List Status: <None>   Medication Order Taking? Sig Documenting Provider Last Dose Status Informant  acetaminophen  (TYLENOL ) 500 MG tablet [id number] Yes Take 1,000 mg by mouth every 8 (eight) hours as needed for moderate pain (pain score 4-6). [provider]  Active Self, Pharmacy Records           Med Note Baruch Likens   DGL Mar 17, 2023  2:52 PM)    albuterol  (VENTOLIN  HFA) 108 [phone] Base) MCG/ACT inhaler [id number] Yes Inhale 2 puffs into the lungs every 4 (four) hours as needed. For breathing. Early, Sara E, NP  Active   aspirin  EC 81 MG tablet [id number] Yes Take 1 tablet (81 mg total) by mouth daily. Early, Sara E, NP  Active   BD PEN NEEDLE NANO 2ND GEN 32G X 4 MM MISC [id number]  USE DAILY WITH INSULIN  AS DIRECTED Early, Adriane Albe, NP  Active   Budeson-Glycopyrrol-Formoterol  (BREZTRI AEROSPHERE) 160-9-4.8 MCG/ACT AERO [id number] Yes Inhale into the lungs. 2 puffs BID - AZ&Me PAP [provider]  Active   Continuous Glucose Receiver (FREESTYLE LIBRE 3 READER) DEVI [id number]  Use with freestyle libre 3 sensor Tysinger, Christiane Cowing, PA-C  Active Self, Pharmacy Records  Continuous Glucose Sensor (FREESTYLE LIBRE 3 SENSOR) [place] [id number] Yes APPLY 1 SENSOR ON THE SKIN EVERY 15 DAYS, USE TO CHECK BLOOD GLUCOSE CONTINUOUSLY Early, Sara E, NP  Active   dextromethorphan -guaiFENesin  (MUCINEX  DM) 30-600 MG 12hr tablet [id number] Yes Take 2 tablets in the morning as needed for cough. Early, Sara E, NP  Active    empagliflozin  (JARDIANCE ) 10 MG TABS tablet [id number] Yes Take 1 tablet (10 mg total) by mouth daily before breakfast. Tysinger, Christiane Cowing, PA-C  Active   gabapentin  (NEURONTIN ) 300 MG capsule [id number] Yes Take 2 capsules (600 mg total) by mouth 3 (three) times daily. For back and foot pain. Early, Sara E, NP  Active            Med Note Carnell Christian   Thu Aug 23, 2023  1:59 PM) 2 times twice a day  glucose blood (ONETOUCH VERIO) test strip [id number]  Use to check blood glucose 3 times daily  Patient not taking: Reported on 09/28/2023     Active Self, Pharmacy Records  HYDROcodone -acetaminophen  (NORCO/VICODIN) 5-325 MG tablet [id number]  Take 1-2 tablets every morning, 1 tablet at noon and 1 tablet at bedtime (DNF 07/01/23)   Active   HYDROcodone -acetaminophen  (NORCO/VICODIN) 5-325 MG tablet [id number] Yes Take 1-2 tablets by mouth every morning AND 1 tablet daily at 12 noon AND 1 tablet at bedtime.   Active   HYDROcodone -acetaminophen  (NORCO/VICODIN) 5-325 MG tablet [id number]  Take 1-2 tablets by mouth every morning AND 1 tablet daily at 12 noon AND 1 tablet at bedtime.   Active   HYDROcodone -acetaminophen  (NORCO/VICODIN) 5-325 MG tablet [id number]  Take 1-2 tablets by mouth every morning AND 1 tablet daily at 12 noon AND 1 tablet at bedtime.   Active   HYDROcodone -acetaminophen  (NORCO/VICODIN) 5-325 MG tablet [id number]  Take 1 to 2 tablets by mouth every morning, 1 tablet at noon, and 1 tablet every night at bedtime.   Active   HYDROcodone -acetaminophen  (NORCO/VICODIN) 5-325 MG tablet [id number]  Take 1 to 2 tablets by mouth every morning, 1 tablet at noon, and 1 tablet every night at bedtime.   Active   insulin  aspart (NOVOLOG ) 100 UNIT/ML injection [id number] Yes Inject 0-9 Units into the skin 3 (three) times daily before meals. Sliding scale CBG 70-120 0 UNITS CBG 121-150 1 UNIT CBG 151-200 2 UNITS CGB 201-250 3 UNITS CGB 251-300 5 UNITS CGB 301-350 7 UNITS CGB 351-400 9 UNITS HIGHER TANH 400 CALL  DOCTOR Early, Adriane Albe, NP  Active   insulin  glargine, 1 Unit Dial , (TOUJEO  SOLOSTAR) 300 UNIT/ML Solostar Pen [id number] Yes Inject 10 Units into the skin daily at 6 (six) AM. Early, Sara E, NP  Active   levofloxacin (LEVAQUIN) 500 MG tablet [id number] Yes Take 1 tablet (500 mg total) by mouth daily for 7 days. Early, Sara E, NP  Active   metFORMIN  (GLUCOPHAGE -XR) 500 MG 24 hr tablet [id number] Yes Take 2 tablets (1,000 mg total) by mouth 2 (two) times daily. Early, Sara E, NP  Active   ondansetron  (ZOFRAN ) 4 MG tablet [id number] Yes Take 1 tablet (4 mg total) by mouth every 8 (eight) hours as needed for nausea or vomiting. Early, Sara E, NP  Active   pantoprazole  (PROTONIX ) 40 MG tablet [id number] Yes Take 1 tablet (40 mg total) by mouth daily. Early, Sara E, NP  Active   rosuvastatin  (CRESTOR ) 20 MG tablet [id number] Yes Take 1  tablet (20 mg total) by mouth daily. Early, Sara E, NP  Active   Testosterone  20.25 MG/ACT (1.62%) GEL [id number]  Place 3 Applications onto the skin daily. Early, Sara E, NP  Active   valsartan  (DIOVAN ) 80 MG tablet [id number] Yes Take 1 tablet (80 mg total) by mouth daily. [place], [name], MD  Active   zolpidem  (AMBIEN ) 10 MG tablet [id number] Yes Take 1 tablet (10 mg total) by mouth at bedtime as needed for sleep. Annella Kief, NP  Active               Assessment/Plan:   Diabetes: - Currently uncontrolled but improving - Reviewed long term cardiovascular and renal outcomes of uncontrolled blood sugar - Reviewed goal A1c, goal fasting, and goal 2 hour post prandial glucose - Reviewed dietary modifications including low carb diet: -Continue current medication regimen. -Counseled on need to limit carb intake and that excessively high sugars will worsen the need to urinate frequently.  -Counseled on importance of adherence to Novolog  and tips for remembering missed doses -Future consideration: Dose increase jardiance , may need to increase toujeo  as well if sugars continue  to elevate    Follow Up Plan: 2 months (sees PCP in 1 month)  Carnell Christian, PharmD Clinical Pharmacist [phone]

## 2023-10-11 ENCOUNTER — Other Ambulatory Visit: Payer: Self-pay | Admitting: Nurse Practitioner

## 2023-10-11 DIAGNOSIS — G4700 Insomnia, unspecified: Secondary | ICD-10-CM

## 2023-10-11 DIAGNOSIS — Z794 Long term (current) use of insulin: Secondary | ICD-10-CM

## 2023-10-11 MED ORDER — ZOLPIDEM TARTRATE 10 MG PO TABS: 10.0000 mg | ORAL_TABLET | Freq: Every evening | ORAL | 2 refills | Status: DC | PRN

## 2023-10-11 MED ORDER — FREESTYLE LIBRE 3 SENSOR MISC: 3 refills | Status: DC

## 2023-10-11 NOTE — Telephone Encounter (Signed)
 Copied from CRM [id number]. Topic: Clinical - Medication Refill >> Oct 11, 2023  3:48 PM Santiya F wrote: Medication: zolpidem  (AMBIEN ) 10 MG tablet [[id number]], Continuous Glucose Sensor (FREESTYLE LIBRE 3 SENSOR) MISC [[id number]]  Has the patient contacted their pharmacy? Yes  (Agent: If yes, when and what did the pharmacy advise?) Pharmacy contacted   This is the patient's preferred pharmacy:   [facility] - [place], [place] - [address] [place] [address] Phone: [phone] Fax: [phone]  Is this the correct pharmacy for this prescription? Yes If no, delete pharmacy and type the correct one.   Has the prescription been filled recently? Yes  Is the patient out of the medication? Yes  Has the patient been seen for an appointment in the last year OR does the patient have an upcoming appointment? Yes  Can we respond through MyChart? No  Agent: Please be advised that Rx refills may take up to 3 business days. We ask that you follow-up with your pharmacy.  Patient is requesting a 90 day supply of Zolpidem  with 3 refills and a 14 day supply of the sensors

## 2023-10-11 NOTE — Telephone Encounter (Signed)
 Pt. Requesting 90 day supply of Zolpidem 

## 2023-10-16 ENCOUNTER — Other Ambulatory Visit (HOSPITAL_COMMUNITY): Payer: Self-pay

## 2023-10-16 DIAGNOSIS — M5459 Other low back pain: Secondary | ICD-10-CM | POA: Diagnosis not present

## 2023-10-16 DIAGNOSIS — M6281 Muscle weakness (generalized): Secondary | ICD-10-CM | POA: Diagnosis not present

## 2023-10-16 DIAGNOSIS — R296 Repeated falls: Secondary | ICD-10-CM | POA: Diagnosis not present

## 2023-10-16 DIAGNOSIS — R262 Difficulty in walking, not elsewhere classified: Secondary | ICD-10-CM | POA: Diagnosis not present

## 2023-10-17 DIAGNOSIS — R262 Difficulty in walking, not elsewhere classified: Secondary | ICD-10-CM | POA: Diagnosis not present

## 2023-10-17 DIAGNOSIS — R296 Repeated falls: Secondary | ICD-10-CM | POA: Diagnosis not present

## 2023-10-17 DIAGNOSIS — M6281 Muscle weakness (generalized): Secondary | ICD-10-CM | POA: Diagnosis not present

## 2023-10-17 DIAGNOSIS — M5459 Other low back pain: Secondary | ICD-10-CM | POA: Diagnosis not present

## 2023-10-18 ENCOUNTER — Other Ambulatory Visit: Payer: Self-pay

## 2023-10-18 DIAGNOSIS — N1831 Chronic kidney disease, stage 3a: Secondary | ICD-10-CM

## 2023-10-18 MED ORDER — FREESTYLE LIBRE 3 SENSOR MISC: 3 refills | Status: DC

## 2023-10-18 NOTE — Telephone Encounter (Signed)
 Copied from CRM [id number]. Topic: Clinical - Prescription Issue >> Oct 18, 2023  8:44 AM Graeme ORN wrote: Reason for CRM: Pharmacy called. Need to clarify qty and instructions for Continuous Glucose Sensor (FREESTYLE LIBRE 3 SENSOR) Kit sent 6/26.  Phone: [phone] REF # [phone]   Spoke with [facility] to clarify that pt would change every 14 days instead of every 15 days.

## 2023-10-23 DIAGNOSIS — M6281 Muscle weakness (generalized): Secondary | ICD-10-CM | POA: Diagnosis not present

## 2023-10-23 DIAGNOSIS — R262 Difficulty in walking, not elsewhere classified: Secondary | ICD-10-CM | POA: Diagnosis not present

## 2023-10-23 DIAGNOSIS — M5459 Other low back pain: Secondary | ICD-10-CM | POA: Diagnosis not present

## 2023-10-23 DIAGNOSIS — R296 Repeated falls: Secondary | ICD-10-CM | POA: Diagnosis not present

## 2023-10-24 DIAGNOSIS — R262 Difficulty in walking, not elsewhere classified: Secondary | ICD-10-CM | POA: Diagnosis not present

## 2023-10-24 DIAGNOSIS — R296 Repeated falls: Secondary | ICD-10-CM | POA: Diagnosis not present

## 2023-10-24 DIAGNOSIS — M5459 Other low back pain: Secondary | ICD-10-CM | POA: Diagnosis not present

## 2023-10-24 DIAGNOSIS — M6281 Muscle weakness (generalized): Secondary | ICD-10-CM | POA: Diagnosis not present

## 2023-10-25 ENCOUNTER — Other Ambulatory Visit (HOSPITAL_COMMUNITY): Payer: Self-pay

## 2023-10-26 ENCOUNTER — Other Ambulatory Visit: Payer: Self-pay

## 2023-10-26 ENCOUNTER — Other Ambulatory Visit (HOSPITAL_COMMUNITY): Payer: Self-pay

## 2023-10-30 DIAGNOSIS — R296 Repeated falls: Secondary | ICD-10-CM | POA: Diagnosis not present

## 2023-10-30 DIAGNOSIS — M6281 Muscle weakness (generalized): Secondary | ICD-10-CM | POA: Diagnosis not present

## 2023-10-30 DIAGNOSIS — R262 Difficulty in walking, not elsewhere classified: Secondary | ICD-10-CM | POA: Diagnosis not present

## 2023-10-30 DIAGNOSIS — M5459 Other low back pain: Secondary | ICD-10-CM | POA: Diagnosis not present

## 2023-11-01 DIAGNOSIS — R262 Difficulty in walking, not elsewhere classified: Secondary | ICD-10-CM | POA: Diagnosis not present

## 2023-11-01 DIAGNOSIS — M6281 Muscle weakness (generalized): Secondary | ICD-10-CM | POA: Diagnosis not present

## 2023-11-01 DIAGNOSIS — R296 Repeated falls: Secondary | ICD-10-CM | POA: Diagnosis not present

## 2023-11-01 DIAGNOSIS — M5459 Other low back pain: Secondary | ICD-10-CM | POA: Diagnosis not present

## 2023-11-05 NOTE — Progress Notes (Signed)
 Last eye exam: about two to three years, [place] [facility]   Catheline Doing, DNP, AGNP-c [facility]  [address] [place], [place] [address] [phone]  ESTABLISHED PATIENT- Chronic Health and/or Follow-Up Visit  Blood pressure 128/72, pulse (!) 55, height 5' 4 (1.626 m), weight 172 lb 12.8 oz (78.4 kg).    History of Present Illness Paul [name] is a 73 year old male with diabetes who presents with neuropathy and medication management.  He experiences burning pain in his feet and hands. The burning sensation in his feet is particularly severe upon waking, requiring support due to the intensity of the pain. His feet feel freezing cold when sitting, necessitating the use of socks to keep them warm. This burning sensation has been present for about a year.  He is attending therapy classes to improve mobility, focusing on his feet and ankles to prevent dragging. Despite this, he continues to experience significant discomfort in his feet.  He is on multiple medications, including hydrocodone  for back pain, which he takes two tablets in the morning, one in the afternoon, and occasionally one more if needed, not exceeding four tablets a day. He also takes gabapentin  for neuropathy in his feet.  Regarding diabetes management, he mentions a previous incident of taking too much insulin . His current regimen includes 10 units of Toujeo  once daily and a sliding scale for quick-acting insulin , adjusted based on blood sugar readings. He experiences occasional high blood sugar levels, particularly after consuming sweets, but is trying to manage his diet better since a recent hospital visit.  He takes Ambien  (zolpidem ) for sleep, which he finds effective without causing morning grogginess.  Socially, he enjoys activities such as feeding squirrels at the park with a friend and receives Meals on Wheels, which he finds beneficial. He lives with a woman who helps prepare meals  that are low in sugar and carbohydrates.  All ROS negative with exception of what is listed above.   PHYSICAL EXAM Physical Exam Vitals and nursing note reviewed.  Constitutional:      Appearance: Normal appearance.  HENT:     Head: Normocephalic.  Eyes:     Pupils: Pupils are equal, round, and reactive to light.  Cardiovascular:     Rate and Rhythm: Normal rate and regular rhythm.     Pulses: Normal pulses.     Heart sounds: Normal heart sounds.  Pulmonary:     Effort: Pulmonary effort is normal.     Breath sounds: Normal breath sounds.  Abdominal:     Palpations: Abdomen is soft.  Musculoskeletal:        General: Normal range of motion.     Cervical back: Normal range of motion.     Right lower leg: No edema.     Left lower leg: No edema.  Skin:    General: Skin is warm.  Neurological:     General: No focal deficit present.     Mental Status: He is alert and oriented to person, place, and time.     Sensory: Sensory deficit present.     Motor: Weakness present.     Gait: Gait abnormal.  Psychiatric:        Mood and Affect: Mood normal.      PLAN Problem List Items Addressed This Visit     DM (diabetes mellitus), type 2 with complications (HCC)   Type 2 Diabetes Mellitus Type 2 diabetes with current insulin  regimen of Toujeo  and sliding scale for mealtime insulin .  Recent dosage adjustments aim to improve glycemic control. Reports occasional hyperglycemia, particularly postprandial after sweets. Blood glucose is within target range 59% of the time, with 32% of readings high and 9% exceeding 250 mg/dL. Emphasis on dietary management to prevent hyperglycemic episodes. - Increase Toujeo  to 12 units daily - Continue sliding scale insulin  for mealtime - Emphasize dietary management to control blood glucose      Relevant Medications   insulin  glargine, 1 Unit Dial , (TOUJEO  SOLOSTAR) 300 UNIT/ML Solostar Pen   Other Relevant Orders   CBC with Differential/Platelet    CMP14+EGFR   Hemoglobin A1c   Lipid panel   Chronic kidney disease, stage 3a (HCC)   Repeat labs today. Ensure diabetes and blood pressure are well controlled for reduced risk of progression.       Relevant Orders   CBC with Differential/Platelet   CMP14+EGFR   Diabetic polyneuropathy associated with type 2 diabetes mellitus (HCC)   Diabetic Neuropathy Chronic burning pain in feet and hands due to diabetic neuropathy. Symptoms include morning burning sensation and cold feet. Current treatment with gabapentin  aims to alleviate symptoms, though complete resolution is unlikely. Adequate blood flow suggests a primarily neuropathic etiology. - Continue gabapentin  for neuropathy - Monitor symptoms and adjust treatment as needed      Relevant Medications   insulin  glargine, 1 Unit Dial , (TOUJEO  SOLOSTAR) 300 UNIT/ML Solostar Pen   Chronic pain syndrome   Chronic Pain Chronic back pain managed with hydrocodone , showing reduced severity. Concerns about potential interactions with sleep aids increasing fall risk. Hydrocodone  is taken up to four times daily as needed. - Continue hydrocodone  as prescribed - Monitor for potential side effects and interactions with other medications      Persistent insomnia   Insomnia Chronic insomnia managed with zolpidem , effectively inducing sleep within 30 minutes without residual effects. Discussion about potential side effects and interactions with pain medication. Recommendations against medication discussed. At this time, he is aware of risks and requests to remain on the medication due to lack of ability to sleep without it.  - Continue zolpidem  as prescribed - Monitor for side effects and interactions with other medications      Chronic heart failure (HCC)   No alarm symptoms present today.       B12 deficiency - Primary   Injection given today      Relevant Orders   Vitamin B12   Testosterone  deficiency in male   Repeat labs      Relevant  Orders   Testosterone , Total, LC/MS/MS   Goals of care, counseling/discussion   Discussion on advance directives and end-of-life care preferences. Prefers CPR and intubation attempted once, declines long-term feeding tube. Brother designated as Cytogeneticist.  - Complete advance directive paperwork MOST form. Copy made. Patient provided with Pink copy to take home.  - Ensure advance directive is accessible at home      Hyperlipidemia associated with type 2 diabetes mellitus (HCC)   Relevant Medications   insulin  glargine, 1 Unit Dial , (TOUJEO  SOLOSTAR) 300 UNIT/ML Solostar Pen   Other Relevant Orders   CBC with Differential/Platelet   CMP14+EGFR   Lipid panel   Hypertension associated with diabetes (HCC)   Relevant Medications   insulin  glargine, 1 Unit Dial , (TOUJEO  SOLOSTAR) 300 UNIT/ML Solostar Pen   Other Relevant Orders   CBC with Differential/Platelet   CMP14+EGFR    Return in about 3 months (around 02/06/2024) for Med Management 30.  Catheline Doing, DNP, AGNP-c

## 2023-11-06 ENCOUNTER — Ambulatory Visit (INDEPENDENT_AMBULATORY_CARE_PROVIDER_SITE_OTHER): Payer: Medicare Other | Admitting: Nurse Practitioner

## 2023-11-06 ENCOUNTER — Encounter: Payer: Self-pay | Admitting: Nurse Practitioner

## 2023-11-06 VITALS — BP 128/72 | HR 55 | Ht 64.0 in | Wt 172.8 lb

## 2023-11-06 DIAGNOSIS — E118 Type 2 diabetes mellitus with unspecified complications: Secondary | ICD-10-CM | POA: Diagnosis not present

## 2023-11-06 DIAGNOSIS — E538 Deficiency of other specified B group vitamins: Secondary | ICD-10-CM | POA: Diagnosis not present

## 2023-11-06 DIAGNOSIS — E1142 Type 2 diabetes mellitus with diabetic polyneuropathy: Secondary | ICD-10-CM | POA: Diagnosis not present

## 2023-11-06 DIAGNOSIS — E1169 Type 2 diabetes mellitus with other specified complication: Secondary | ICD-10-CM | POA: Diagnosis not present

## 2023-11-06 DIAGNOSIS — G894 Chronic pain syndrome: Secondary | ICD-10-CM | POA: Diagnosis not present

## 2023-11-06 DIAGNOSIS — E785 Hyperlipidemia, unspecified: Secondary | ICD-10-CM | POA: Diagnosis not present

## 2023-11-06 DIAGNOSIS — E291 Testicular hypofunction: Secondary | ICD-10-CM | POA: Diagnosis not present

## 2023-11-06 DIAGNOSIS — E1159 Type 2 diabetes mellitus with other circulatory complications: Secondary | ICD-10-CM | POA: Diagnosis not present

## 2023-11-06 DIAGNOSIS — Z7189 Other specified counseling: Secondary | ICD-10-CM | POA: Insufficient documentation

## 2023-11-06 DIAGNOSIS — I152 Hypertension secondary to endocrine disorders: Secondary | ICD-10-CM | POA: Diagnosis not present

## 2023-11-06 DIAGNOSIS — N1831 Chronic kidney disease, stage 3a: Secondary | ICD-10-CM | POA: Diagnosis not present

## 2023-11-06 DIAGNOSIS — G4700 Insomnia, unspecified: Secondary | ICD-10-CM | POA: Diagnosis not present

## 2023-11-06 DIAGNOSIS — I509 Heart failure, unspecified: Secondary | ICD-10-CM | POA: Diagnosis not present

## 2023-11-06 MED ORDER — CYANOCOBALAMIN 1000 MCG/ML IJ SOLN
1000.0000 ug | Freq: Once | INTRAMUSCULAR | Status: AC
  Administered 2023-11-06: 1000 ug via INTRAMUSCULAR

## 2023-11-06 MED ORDER — TOUJEO SOLOSTAR 300 UNIT/ML ~~LOC~~ SOPN: 12.0000 [IU] | PEN_INJECTOR | Freq: Every day | SUBCUTANEOUS | Status: DC

## 2023-11-06 NOTE — Assessment & Plan Note (Signed)
 Insomnia Chronic insomnia managed with zolpidem , effectively inducing sleep within 30 minutes without residual effects. Discussion about potential side effects and interactions with pain medication. Recommendations against medication discussed. At this time, he is aware of risks and requests to remain on the medication due to lack of ability to sleep without it.  - Continue zolpidem  as prescribed - Monitor for side effects and interactions with other medications

## 2023-11-06 NOTE — Assessment & Plan Note (Signed)
 Type 2 Diabetes Mellitus Type 2 diabetes with current insulin  regimen of Toujeo  and sliding scale for mealtime insulin . Recent dosage adjustments aim to improve glycemic control. Reports occasional hyperglycemia, particularly postprandial after sweets. Blood glucose is within target range 59% of the time, with 32% of readings high and 9% exceeding 250 mg/dL. Emphasis on dietary management to prevent hyperglycemic episodes. - Increase Toujeo  to 12 units daily - Continue sliding scale insulin  for mealtime - Emphasize dietary management to control blood glucose

## 2023-11-06 NOTE — Assessment & Plan Note (Signed)
 Repeat labs today. Ensure diabetes and blood pressure are well controlled for reduced risk of progression.

## 2023-11-06 NOTE — Assessment & Plan Note (Signed)
Injection given today. 

## 2023-11-06 NOTE — Assessment & Plan Note (Signed)
 Diabetic Neuropathy Chronic burning pain in feet and hands due to diabetic neuropathy. Symptoms include morning burning sensation and cold feet. Current treatment with gabapentin  aims to alleviate symptoms, though complete resolution is unlikely. Adequate blood flow suggests a primarily neuropathic etiology. - Continue gabapentin  for neuropathy - Monitor symptoms and adjust treatment as needed

## 2023-11-06 NOTE — Assessment & Plan Note (Signed)
 No alarm symptoms present today.

## 2023-11-06 NOTE — Assessment & Plan Note (Signed)
 Repeat labs:

## 2023-11-06 NOTE — Patient Instructions (Addendum)
 I want you to increase your LONG ACTING insulin  (Glargine) to 12 units once every morning.   Continue your short acting insulin  (Novolog Nemiah) with the sliding scale that you have at home.   Keep working on your exercise and therapy.

## 2023-11-06 NOTE — Assessment & Plan Note (Signed)
 Chronic Pain Chronic back pain managed with hydrocodone , showing reduced severity. Concerns about potential interactions with sleep aids increasing fall risk. Hydrocodone  is taken up to four times daily as needed. - Continue hydrocodone  as prescribed - Monitor for potential side effects and interactions with other medications

## 2023-11-06 NOTE — Assessment & Plan Note (Signed)
 Discussion on advance directives and end-of-life care preferences. Prefers CPR and intubation attempted once, declines long-term feeding tube. Brother designated as Cytogeneticist.  - Complete advance directive paperwork MOST form. Copy made. Patient provided with Pink copy to take home.  - Ensure advance directive is accessible at home

## 2023-11-08 DIAGNOSIS — R262 Difficulty in walking, not elsewhere classified: Secondary | ICD-10-CM | POA: Diagnosis not present

## 2023-11-08 DIAGNOSIS — M6281 Muscle weakness (generalized): Secondary | ICD-10-CM | POA: Diagnosis not present

## 2023-11-08 DIAGNOSIS — R296 Repeated falls: Secondary | ICD-10-CM | POA: Diagnosis not present

## 2023-11-08 DIAGNOSIS — M5459 Other low back pain: Secondary | ICD-10-CM | POA: Diagnosis not present

## 2023-11-08 LAB — CMP14+EGFR
ALT: 25 IU/L (ref 0–44)
AST: 37 IU/L (ref 0–40)
Albumin: 4.4 g/dL (ref 3.8–4.8)
Alkaline Phosphatase: 122 IU/L — ABNORMAL HIGH (ref 44–121)
BUN/Creatinine Ratio: 23 (ref 10–24)
BUN: 27 mg/dL (ref 8–27)
Bilirubin Total: 0.4 mg/dL (ref 0.0–1.2)
CO2: 18 mmol/L — ABNORMAL LOW (ref 20–29)
Calcium: 9.4 mg/dL (ref 8.6–10.2)
Chloride: 104 mmol/L (ref 96–106)
Creatinine, Ser: 1.16 mg/dL (ref 0.76–1.27)
Globulin, Total: 2.7 g/dL (ref 1.5–4.5)
Glucose: 124 mg/dL — ABNORMAL HIGH (ref 70–99)
Potassium: 4.9 mmol/L (ref 3.5–5.2)
Sodium: 140 mmol/L (ref 134–144)
Total Protein: 7.1 g/dL (ref 6.0–8.5)
eGFR: 66 mL/min/1.73 (ref >59)

## 2023-11-08 LAB — CBC WITH DIFFERENTIAL/PLATELET
Basophils Absolute: 0.1 x10E3/uL (ref 0.0–0.2)
Basos: 1 %
EOS (ABSOLUTE): 0.3 x10E3/uL (ref 0.0–0.4)
Eos: 4 %
Hematocrit: 46.5 % (ref 37.5–51.0)
Hemoglobin: 15.1 g/dL (ref 13.0–17.7)
Immature Grans (Abs): 0 x10E3/uL (ref 0.0–0.1)
Immature Granulocytes: 0 %
Lymphocytes Absolute: 1.7 x10E3/uL (ref 0.7–3.1)
Lymphs: 20 %
MCH: 29.7 pg (ref 26.6–33.0)
MCHC: 32.5 g/dL (ref 31.5–35.7)
MCV: 91 fL (ref 79–97)
Monocytes Absolute: 0.6 x10E3/uL (ref 0.1–0.9)
Monocytes: 7 %
Neutrophils Absolute: 6.1 x10E3/uL (ref 1.4–7.0)
Neutrophils: 68 %
Platelets: 126 x10E3/uL — ABNORMAL LOW (ref 150–450)
RBC: 5.09 x10E6/uL (ref 4.14–5.80)
RDW: 13.6 % (ref 11.6–15.4)
WBC: 8.8 x10E3/uL (ref 3.4–10.8)

## 2023-11-08 LAB — LIPID PANEL
Chol/HDL Ratio: 2.8 ratio (ref 0.0–5.0)
Cholesterol, Total: 65 mg/dL — ABNORMAL LOW (ref 100–199)
HDL: 23 mg/dL — ABNORMAL LOW (ref >39)
LDL Chol Calc (NIH): 17 mg/dL (ref 0–99)
Triglycerides: 143 mg/dL (ref 0–149)
VLDL Cholesterol Cal: 25 mg/dL (ref 5–40)

## 2023-11-08 LAB — VITAMIN B12: Vitamin B-12: 1672 pg/mL — ABNORMAL HIGH (ref 232–1245)

## 2023-11-08 LAB — TESTOSTERONE, TOTAL, LC/MS/MS: Testosterone, total: 149.8 ng/dL — AB (ref 264.0–916.0)

## 2023-11-08 LAB — HEMOGLOBIN A1C
Est. average glucose Bld gHb Est-mCnc: 177 mg/dL
Hgb A1c MFr Bld: 7.8 % — ABNORMAL HIGH (ref 4.8–5.6)

## 2023-11-13 DIAGNOSIS — R296 Repeated falls: Secondary | ICD-10-CM | POA: Diagnosis not present

## 2023-11-13 DIAGNOSIS — M5459 Other low back pain: Secondary | ICD-10-CM | POA: Diagnosis not present

## 2023-11-13 DIAGNOSIS — M6281 Muscle weakness (generalized): Secondary | ICD-10-CM | POA: Diagnosis not present

## 2023-11-13 DIAGNOSIS — R262 Difficulty in walking, not elsewhere classified: Secondary | ICD-10-CM | POA: Diagnosis not present

## 2023-11-15 ENCOUNTER — Other Ambulatory Visit: Payer: Self-pay | Admitting: Nurse Practitioner

## 2023-11-15 ENCOUNTER — Other Ambulatory Visit: Payer: Self-pay

## 2023-11-15 ENCOUNTER — Other Ambulatory Visit: Payer: Self-pay | Admitting: Cardiology

## 2023-11-15 DIAGNOSIS — I739 Peripheral vascular disease, unspecified: Secondary | ICD-10-CM

## 2023-11-15 DIAGNOSIS — E785 Hyperlipidemia, unspecified: Secondary | ICD-10-CM

## 2023-11-15 DIAGNOSIS — Z794 Long term (current) use of insulin: Secondary | ICD-10-CM

## 2023-11-15 DIAGNOSIS — Q762 Congenital spondylolisthesis: Secondary | ICD-10-CM

## 2023-11-15 DIAGNOSIS — M5030 Other cervical disc degeneration, unspecified cervical region: Secondary | ICD-10-CM

## 2023-11-15 DIAGNOSIS — K219 Gastro-esophageal reflux disease without esophagitis: Secondary | ICD-10-CM

## 2023-11-15 MED ORDER — VALSARTAN 80 MG PO TABS: 80.0000 mg | ORAL_TABLET | Freq: Every day | ORAL | 2 refills | Status: AC

## 2023-11-20 DIAGNOSIS — M5459 Other low back pain: Secondary | ICD-10-CM | POA: Diagnosis not present

## 2023-11-20 DIAGNOSIS — M6281 Muscle weakness (generalized): Secondary | ICD-10-CM | POA: Diagnosis not present

## 2023-11-20 DIAGNOSIS — R262 Difficulty in walking, not elsewhere classified: Secondary | ICD-10-CM | POA: Diagnosis not present

## 2023-11-20 DIAGNOSIS — R296 Repeated falls: Secondary | ICD-10-CM | POA: Diagnosis not present

## 2023-11-21 ENCOUNTER — Other Ambulatory Visit: Payer: Self-pay | Admitting: Nurse Practitioner

## 2023-11-21 ENCOUNTER — Other Ambulatory Visit (HOSPITAL_COMMUNITY): Payer: Self-pay

## 2023-11-21 DIAGNOSIS — E291 Testicular hypofunction: Secondary | ICD-10-CM

## 2023-11-21 MED ORDER — TESTOSTERONE 20.25 MG/ACT (1.62%) TD GEL
3.0000 | Freq: Every day | TRANSDERMAL | 0 refills | Status: DC
  Filled 2023-11-21: qty 75, 20d supply, fill #0

## 2023-11-21 NOTE — Telephone Encounter (Signed)
 Last apt 11/06/23

## 2023-11-21 NOTE — Telephone Encounter (Signed)
 Copied from CRM [id number]. Topic: Clinical - Medication Refill >> Nov 21, 2023  9:19 AM Wess S wrote: Medication: Testosterone  20.25 MG/ACT (1.62%) GEL. Patient states Early, Camie, NP stated she wanted to increase the strength of medication   Has the patient contacted their pharmacy? No (Agent: If no, request that the patient contact the pharmacy for the refill. If patient does not wish to contact the pharmacy document the reason why and proceed with request.) (Agent: If yes, when and what did the pharmacy advise?)  This is the patient's preferred pharmacy:  [facility] [address] [place] [address] Phone: [phone] Fax: [phone]  Is this the correct pharmacy for this prescription? Yes If no, delete pharmacy and type the correct one.   Has the prescription been filled recently? Yes  Is the patient out of the medication? Yes  Has the patient been seen for an appointment in the last year OR does the patient have an upcoming appointment? Yes  Can we respond through MyChart? No  Agent: Please be advised that Rx refills may take up to 3 business days. We ask that you follow-up with your pharmacy.

## 2023-11-22 ENCOUNTER — Other Ambulatory Visit (HOSPITAL_COMMUNITY): Payer: Self-pay

## 2023-11-27 ENCOUNTER — Other Ambulatory Visit (HOSPITAL_COMMUNITY): Payer: Self-pay

## 2023-11-27 ENCOUNTER — Ambulatory Visit (INDEPENDENT_AMBULATORY_CARE_PROVIDER_SITE_OTHER): Admitting: Medical

## 2023-11-27 VITALS — BP 120/68 | HR 63 | Temp 97.9°F | Wt 174.2 lb

## 2023-11-27 DIAGNOSIS — J988 Other specified respiratory disorders: Secondary | ICD-10-CM | POA: Diagnosis not present

## 2023-11-27 DIAGNOSIS — R059 Cough, unspecified: Secondary | ICD-10-CM

## 2023-11-27 DIAGNOSIS — J449 Chronic obstructive pulmonary disease, unspecified: Secondary | ICD-10-CM

## 2023-11-27 LAB — POCT INFLUENZA A/B
Influenza A, POC: NEGATIVE
Influenza B, POC: NEGATIVE

## 2023-11-27 LAB — POC COVID19 BINAXNOW: SARS Coronavirus 2 Ag: NEGATIVE

## 2023-11-27 MED ORDER — AMOXICILLIN-POT CLAVULANATE 875-125 MG PO TABS
1.0000 | ORAL_TABLET | Freq: Two times a day (BID) | ORAL | 0 refills | Status: DC
Start: 2023-11-27 — End: 2023-12-13
  Filled 2023-11-27: qty 20, 10d supply, fill #0

## 2023-11-27 MED ORDER — BENZONATATE 100 MG PO CAPS
100.0000 mg | ORAL_CAPSULE | Freq: Two times a day (BID) | ORAL | 0 refills | Status: DC | PRN
Start: 2023-11-27 — End: 2024-01-14
  Filled 2023-11-27: qty 20, 10d supply, fill #0

## 2023-11-27 NOTE — Addendum Note (Signed)
 Addended by: VICCI HUSBAND A on: 11/27/2023 12:37 PM   Modules accepted: Orders

## 2023-11-27 NOTE — Progress Notes (Signed)
 Subjective:  Paul [name] is a 73 y.o. male who presents for Chief Complaint  Patient presents with   Acute Visit    Coughing x 1 week and coughing up white mucous, had pneumonia 3 months ago and feels like he might have it again due to him feeling bad     Here for not feeling well.   He notes 6-7 days of not feeling well.   He notes cough, getting up colored mucous, feels rattly in chest.   Worried about pneumonia.  Feels tired.  Not sore throat, but irritated from coughing.  Has some head pressure but no ear pain.  No fever, no chills, but has pains/aches in general.  Feels a little SOB.  No sick contacts.   Using his regular medications.  No other aggravating or relieving factors.    No other c/o.  Past Medical History:  Diagnosis Date   Angina, class III (HCC)    Anxiety    Arthritis    Bilateral carotid artery stenosis    BPH without obstruction/lower urinary tract symptoms    CAD 11/24/2011   a) Mild-to-moderate 30-40% lesions in the RCA, LAD and Circumflex. b) CULPRIT LESION: long tubular 70-80% lesion in D1 with FFR of 0.7 --> PCI w/ Xience Xpedition DES 2.5 mm x 30 mm (2.65 MM); c) Lexiscan  Myoview  11/2013: No Ischemia or Infarct (Inferior Gut Attenuation) EF 63%.   Chronic fatigue syndrome 06/13/2021   Chronic heart failure (HCC)    Chronic kidney disease (CKD), stage III (moderate) (HCC)    Chronic pain syndrome    Cirrhosis (HCC)    Constipation by delayed colonic transit 06/13/2021   Depression    Diverticulosis    Dysphagia    Elevated alkaline phosphatase level 08/17/2022   Elevated levels of transaminase & lactic acid dehydrogenase 06/09/2016   Exertional dyspnea, chronic    Fall 02/16/2023   Former smoker    GERD (gastroesophageal reflux disease)    Gout    H/O: GI bleed    Hearing loss    Hypo-osmolality and hyponatremia 03/11/2019   Hypoglycemia 03/18/2023   Hypotension due to drugs 10/09/2022   Iron  deficiency anemia    Lipoprotein deficiency disorder     Long term (current) use of insulin  (HCC) 07/15/2019   Malnutrition of moderate degree 03/19/2023   Obesity (BMI 30.0-34.9)    OSA (obstructive sleep apnea), uses oxygen  at home did not tolerate cpap 11/24/2011   Pain due to onychomycosis of toenails of both feet 08/23/2022   Pain in both lower extremities 02/14/2022   Paresthesia of skin 02/17/2015   Peripheral vascular disease (HCC)    Portal hypertension (HCC)    Right hip pain 06/13/2021   S/P CABG (coronary artery bypass graft)    Shoulder joint pain 02/17/2015   Spinal stenosis    Splenomegaly    Thrombocytopenia (HCC)    Vitamin D  deficiency    Current Outpatient Medications on File Prior to Visit  Medication Sig Dispense Refill   acetaminophen  (TYLENOL ) 500 MG tablet Take 1,000 mg by mouth every 8 (eight) hours as needed for moderate pain (pain score 4-6).     aspirin  EC 81 MG tablet Take 1 tablet (81 mg total) by mouth daily. 90 tablet 3   Budeson-Glycopyrrol-Formoterol  (BREZTRI AEROSPHERE) 160-9-4.8 MCG/ACT AERO Inhale into the lungs. 2 puffs BID - [facility] PAP     Continuous Glucose Receiver (FREESTYLE LIBRE 3 READER) DEVI Use with freestyle libre 3 sensor 1 each 1   Continuous  Glucose Sensor (FREESTYLE LIBRE 3 SENSOR) MISC APPLY 1 SENSOR ON THE SKIN EVERY 14 DAYS, USE TO CHECK BLOOD GLUCOSE CONTINUOUSLY 6 each 3   empagliflozin  (JARDIANCE ) 10 MG TABS tablet Take 1 tablet (10 mg total) by mouth daily before breakfast. 30 tablet 2   gabapentin  (NEURONTIN ) 300 MG capsule Take 2 capsules (600 mg total) by mouth 3 (three) times daily. For back and foot pain. 540 capsule 3   HYDROcodone -acetaminophen  (NORCO/VICODIN) 5-325 MG tablet Take 1-2 tablets every morning, 1 tablet at noon and 1 tablet at bedtime (DNF 07/01/23) 120 tablet 0   insulin  aspart (NOVOLOG ) 100 UNIT/ML injection Inject 0-9 Units into the skin 3 (three) times daily before meals. Sliding scale CBG 70-120 0 UNITS CBG 121-150 1 UNIT CBG 151-200 2 UNITS CGB 201-250 3 UNITS CGB  251-300 5 UNITS CGB 301-350 7 UNITS CGB 351-400 9 UNITS HIGHER TANH 400 CALL DOCTOR 120 mL 3   insulin  glargine, 1 Unit Dial , (TOUJEO  SOLOSTAR) 300 UNIT/ML Solostar Pen Inject 12 Units into the skin daily at 6 (six) AM.     metFORMIN  (GLUCOPHAGE -XR) 500 MG 24 hr tablet Take 2 tablets (1,000 mg total) by mouth 2 (two) times daily. 360 tablet 3   ondansetron  (ZOFRAN ) 4 MG tablet Take 1 tablet (4 mg total) by mouth every 8 (eight) hours as needed for nausea or vomiting. 20 tablet 2   pantoprazole  (PROTONIX ) 40 MG tablet TAKE 1 TABLET DAILY (REPLACES OMEPRAZOLE ) 90 tablet 3   rosuvastatin  (CRESTOR ) 20 MG tablet TAKE 1 TABLET DAILY 90 tablet 3   Testosterone  20.25 MG/ACT (1.62%) GEL Place 3 Applications onto the skin daily. 75 g 0   valsartan  (DIOVAN ) 80 MG tablet Take 1 tablet (80 mg total) by mouth daily. 90 tablet 2   zolpidem  (AMBIEN ) 10 MG tablet Take 1 tablet (10 mg total) by mouth at bedtime as needed for sleep. 30 tablet 2   albuterol  (VENTOLIN  HFA) 108 (90 Base) MCG/ACT inhaler Inhale 2 puffs into the lungs every 4 (four) hours as needed. For breathing. 6.7 g 3   BD PEN NEEDLE NANO 2ND GEN 32G X 4 MM MISC USE DAILY WITH INSULIN  AS DIRECTED 90 each 3   dextromethorphan -guaiFENesin  (MUCINEX  DM) 30-600 MG 12hr tablet Take 2 tablets in the morning as needed for cough. (Patient not taking: Reported on 11/27/2023) 30 tablet 0   glucose blood (ONETOUCH VERIO) test strip Use to check blood glucose 3 times daily (Patient not taking: Reported on 11/06/2023) 300 each 3   HYDROcodone -acetaminophen  (NORCO/VICODIN) 5-325 MG tablet Take 1-2 tablets by mouth every morning AND 1 tablet daily at 12 noon AND 1 tablet at bedtime. 120 tablet 0   HYDROcodone -acetaminophen  (NORCO/VICODIN) 5-325 MG tablet Take 1-2 tablets by mouth every morning AND 1 tablet daily at 12 noon AND 1 tablet at bedtime. 120 tablet 0   HYDROcodone -acetaminophen  (NORCO/VICODIN) 5-325 MG tablet Take 1-2 tablets by mouth every morning AND 1 tablet  daily at 12 noon AND 1 tablet at bedtime. 120 tablet 0   HYDROcodone -acetaminophen  (NORCO/VICODIN) 5-325 MG tablet Take 1 to 2 tablets by mouth every morning, 1 tablet at noon, and 1 tablet every night at bedtime. 120 tablet 0   HYDROcodone -acetaminophen  (NORCO/VICODIN) 5-325 MG tablet Take 1 to 2 tablets by mouth every morning, 1 tablet at noon, and 1 tablet every night at bedtime. 120 tablet 0   No current facility-administered medications on file prior to visit.     The following portions of the patient's history were  reviewed and updated as appropriate: allergies, current medications, past family history, past medical history, past social history, past surgical history and problem list.  ROS Otherwise as in subjective above  Objective: BP 120/68   Pulse 63   Temp 97.9 F (36.6 C)   Wt 174 lb 3.2 oz (79 kg)   SpO2 90%   BMI 29.90 kg/m   General appearance: alert, no distress, well developed, well nourished HEENT: normocephalic, sclerae anicteric, conjunctiva pink and moist, TMs pearly, nares patent, no discharge or erythema, pharynx normal Oral cavity: somewhat dry MM, no lesions Neck: supple, no lymphadenopathy, no thyromegaly, no masses Heart: RRR, normal S1, S2, no murmurs Lungs: somewhat dull left lower fields, otherwise relatively clear, no wheezes, rhonchi, or rales Pulses: 2+ radial pulses, 2+ pedal pulses, normal cap refill Ext: no edema   Assessment: Encounter Diagnoses  Name Primary?   Cough, unspecified type Yes   Respiratory tract infection    COPD mixed type (HCC)      Plan: Advise rest, hydration, begin medication as below, begin Mucinex  DM over-the-counter for the next 5 days twice a day.  Continue your albuterol  rescue inhaler and maintenance inhalers as usual.  We discussed the potential for x-ray.  He declines for now.  Advise if he is worse in the next 3 to 5 days or not seeing improvement, then get reevaluated right away   Josh was seen today for  acute visit.  Diagnoses and all orders for this visit:  Cough, unspecified type  Respiratory tract infection  COPD mixed type (HCC)  Other orders -     amoxicillin -clavulanate (AUGMENTIN ) 875-125 MG tablet; Take 1 tablet by mouth 2 (two) times daily. -     benzonatate  (TESSALON ) 100 MG capsule; Take 1 capsule (100 mg total) by mouth 2 (two) times daily as needed for cough.    Follow up: prn

## 2023-11-28 ENCOUNTER — Ambulatory Visit: Admitting: Dermatology

## 2023-11-28 ENCOUNTER — Encounter: Payer: Self-pay | Admitting: Dermatology

## 2023-11-28 DIAGNOSIS — Z1283 Encounter for screening for malignant neoplasm of skin: Secondary | ICD-10-CM | POA: Diagnosis not present

## 2023-11-28 DIAGNOSIS — L814 Other melanin hyperpigmentation: Secondary | ICD-10-CM | POA: Diagnosis not present

## 2023-11-28 DIAGNOSIS — L918 Other hypertrophic disorders of the skin: Secondary | ICD-10-CM

## 2023-11-28 DIAGNOSIS — L570 Actinic keratosis: Secondary | ICD-10-CM | POA: Diagnosis not present

## 2023-11-28 DIAGNOSIS — G8918 Other acute postprocedural pain: Secondary | ICD-10-CM

## 2023-11-28 DIAGNOSIS — D229 Melanocytic nevi, unspecified: Secondary | ICD-10-CM

## 2023-11-28 DIAGNOSIS — L821 Other seborrheic keratosis: Secondary | ICD-10-CM | POA: Diagnosis not present

## 2023-11-28 DIAGNOSIS — L578 Other skin changes due to chronic exposure to nonionizing radiation: Secondary | ICD-10-CM

## 2023-11-28 DIAGNOSIS — Z85828 Personal history of other malignant neoplasm of skin: Secondary | ICD-10-CM | POA: Diagnosis not present

## 2023-11-28 DIAGNOSIS — W908XXA Exposure to other nonionizing radiation, initial encounter: Secondary | ICD-10-CM | POA: Diagnosis not present

## 2023-11-28 DIAGNOSIS — D1801 Hemangioma of skin and subcutaneous tissue: Secondary | ICD-10-CM

## 2023-11-28 NOTE — Patient Instructions (Addendum)

## 2023-11-28 NOTE — Progress Notes (Signed)
 Follow-Up Visit   Subjective  Paul [name] is a 73 y.o. male who presents for the following: Skin Cancer Screening and Full Body Skin Exam  Patient denies any new concerns today. States that the previous surgery site still has a different sensation that causes a feeling down his finger, but it is not painful. Denies spending excessive amounts of time in the sun.  The patient presents for Total-Body Skin Exam (TBSE) for skin cancer screening and mole check. The patient has spots, moles and lesions to be evaluated, some may be new or changing.  The following portions of the chart were reviewed this encounter and updated as appropriate: medications, allergies, medical history  Review of Systems:  No other skin or systemic complaints except as noted in HPI or Assessment and Plan.  Objective  Well appearing patient in no apparent distress; mood and affect are within normal limits.  A full examination was performed including scalp, head, eyes, ears, nose, lips, neck, chest, axillae, abdomen, back, buttocks, bilateral upper extremities, bilateral lower extremities, hands, feet, fingers, toes, fingernails, and toenails. All findings within normal limits unless otherwise noted below.   Relevant physical exam findings are noted in the Assessment and Plan.  Left Parietal Scalp Erythematous thin papules/macules with gritty scale. Erythematous thin papules/macules with gritty scale.   Assessment & Plan   SKIN CANCER SCREENING PERFORMED TODAY.  ACTINIC DAMAGE - Chronic condition, secondary to cumulative UV/sun exposure - diffuse scaly erythematous macules with underlying dyspigmentation - Recommend daily broad spectrum sunscreen SPF 30+ to sun-exposed areas, reapply every 2 hours as needed.  - Staying in the shade or wearing long sleeves, sun glasses (UVA+UVB protection) and wide brim hats (4-inch brim around the entire circumference of the hat) are also recommended for sun protection.  - Call  for new or changing lesions.  LENTIGINES, SEBORRHEIC KERATOSES, HEMANGIOMAS - Benign normal skin lesions - Benign-appearing - Call for any changes  MELANOCYTIC NEVI - Tan-brown and/or pink-flesh-colored symmetric macules and papules - Benign appearing on exam today - Observation - Call clinic for new or changing moles - Recommend daily use of broad spectrum spf 30+ sunscreen to sun-exposed areas.   HISTORY OF SQUAMOUS CELL CARCINOMA OF THE SKIN - No evidence of recurrence today - No lymphadenopathy - Recommend regular full body skin exams - Recommend daily broad spectrum sunscreen SPF 30+ to sun-exposed areas, reapply every 2 hours as needed.  - Call if any new or changing lesions are noted between office visits   Postoperative Soreness- Discussed that nerves can take many month to return to normal function after surgery  Acrochordons (Skin Tags) - Fleshy, skin-colored pedunculated papules - Benign appearing.  - Observe. - If desired, they can be removed with an in office procedure that is not covered by insurance. - Recommended over the counter Freeze Away from Dr. Farris - Please call the clinic if you notice any new or changing lesions.   AK (ACTINIC KERATOSIS) Left Parietal Scalp Destruction of lesion - Left Parietal Scalp Complexity: simple   Destruction method: cryotherapy   Informed consent: discussed and consent obtained   Timeout:  patient name, date of birth, surgical site, and procedure verified Lesion destroyed using liquid nitrogen: Yes   Region frozen until ice ball extended beyond lesion: Yes   Outcome: patient tolerated procedure well with no complications   Post-procedure details: wound care instructions given    Return in about 6 months (around 05/30/2024) for FBSE.  [name] Rollene Gobble, RN, am acting  as scribe for RUFUS CHRISTELLA HOLY, MD .   Documentation: I have reviewed the above documentation for accuracy and completeness, and I agree with the  above.  RUFUS CHRISTELLA HOLY, MD

## 2023-12-06 ENCOUNTER — Other Ambulatory Visit

## 2023-12-06 ENCOUNTER — Telehealth: Payer: Self-pay

## 2023-12-06 DIAGNOSIS — E118 Type 2 diabetes mellitus with unspecified complications: Secondary | ICD-10-CM

## 2023-12-06 DIAGNOSIS — E291 Testicular hypofunction: Secondary | ICD-10-CM

## 2023-12-06 NOTE — Telephone Encounter (Signed)
 Received a request for a new RX from [facility] on Breztri  can be fax to [facility] at [phone]

## 2023-12-06 NOTE — Progress Notes (Signed)
 12/06/2023 Name: Paul [name] MRN: [id number] DOB: 1950/11/04  Chief Complaint  Patient presents with   Medication Management    Paul [name] is a 73 y.o. year old male who presented for a telephone visit.   They were referred to the pharmacist by their PCP for assistance in managing complex medication management.    Subjective:  Care Team: Primary Care Provider: Oris Camie BRAVO, NP ; Next Scheduled Visit: 02/08/24  Medication Access/Adherence  Current Pharmacy:  [facility] [place] [address] [place] [address] Phone: [phone] Fax: [phone]  [facility] DELIVERY - [place], [place] - [address] [place] [address] Phone: [phone] Fax: [phone]   Patient reports affordability concerns with their medications: No  Patient reports access/transportation concerns to their pharmacy: No  Patient reports adherence concerns with their medications:  No     Diabetes:  Current medications: Novolog  ss, Toujeo  12 units, Metformin  XR 1000mg  BID, Jardiance  10mg  Medications tried in the past: Glipizide, Ozempic (N/V), Farxiga (frequent nighttime urination)  Current glucose readings:    Patient denies hypoglycemic s/sx including dizziness, shakiness, sweating. Patient denies hyperglycemic symptoms including polyuria, polydipsia, polyphagia, nocturia, neuropathy, blurred vision   Patient's sugars were responding well to ozempic, no more lows. However, patient has stopped ozempic due to nausea and vomiting despite zofran . Does not wish to continue taking.  At time of med review, patient spells out that he is taking Farxiga. Does not see jardiance  while looking at his medications. Reports that he feels he is urinating too much and is unsure if he has been taking both.  PAP jardiance  approved 08/27/23.  Testosterone  Diff -Patient feels low energy and thinks testosterone  is not helping.  Reports he has been applying the androgel  to his thigh areas  Objective:  Lab Results  Component Value Date   HGBA1C 7.8 (H) 11/06/2023    Lab Results  Component Value Date   CREATININE 1.16 11/06/2023   BUN 27 11/06/2023   NA 140 11/06/2023   K 4.9 11/06/2023   CL 104 11/06/2023   CO2 18 (L) 11/06/2023    Lab Results  Component Value Date   CHOL 65 (L) 11/06/2023   HDL 23 (L) 11/06/2023   LDLCALC 17 11/06/2023   TRIG 143 11/06/2023   CHOLHDL 2.8 11/06/2023    Medications Reviewed Today     Reviewed by Lionell Jon DEL, RPH (Pharmacist) on 12/06/23 at 1349  Med List Status: <None>   Medication Order Taking? Sig Documenting Provider Last Dose Status Informant  acetaminophen  (TYLENOL ) 500 MG tablet [id number]  Take 1,000 mg by mouth every 8 (eight) hours as needed for moderate pain (pain score 4-6). [provider]  Active Self, Pharmacy Records           Med Note ANGUS MAXWELL   Dju Mar 17, 2023  2:52 PM)    albuterol  (VENTOLIN  HFA) 108 [id number] Base) MCG/ACT inhaler [id number]  Inhale 2 puffs into the lungs every 4 (four) hours as needed. For breathing. Early, Sara E, NP  Active   amoxicillin -clavulanate (AUGMENTIN ) 875-125 MG tablet [id number]  Take 1 tablet by mouth 2 (two) times daily. Bulah Alm GORMAN DEVONNA  Active   aspirin  EC 81 MG tablet [id number]  Take 1 tablet (81 mg total) by mouth daily. Early, Sara E, NP  Active   BD PEN NEEDLE NANO 2ND GEN 32G X 4 MM MISC [id number]  USE DAILY WITH INSULIN   AS DIRECTED Early, Sara E, NP  Active   benzonatate  (TESSALON ) 100 MG capsule [id number]  Take 1 capsule (100 mg total) by mouth 2 (two) times daily as needed for cough. Tysinger, Alm RAMAN, PA-C  Active   Budeson-Glycopyrrol-Formoterol  (BREZTRI  AEROSPHERE) 160-9-4.8 MCG/ACT TERESE [id number]  Inhale into the lungs. 2 puffs BID - [facility] PAP [provider]  Active   Continuous Glucose Receiver (FREESTYLE LIBRE 3 READER) DEVI [id number]  Use with freestyle libre 3 sensor  Tysinger, Alm RAMAN, PA-C  Active Self, Pharmacy Records  Continuous Glucose Sensor (FREESTYLE LIBRE 3 SENSOR) MISC [id number]  APPLY 1 SENSOR ON THE SKIN EVERY 14 DAYS, USE TO CHECK BLOOD GLUCOSE CONTINUOUSLY Early, Sara E, NP  Active   dextromethorphan -guaiFENesin  (MUCINEX  DM) 30-600 MG 12hr tablet [id number]  Take 2 tablets in the morning as needed for cough.  Patient not taking: Reported on 11/27/2023   Oris Camie BRAVO, NP  Active   empagliflozin  (JARDIANCE ) 10 MG TABS tablet [id number]  Take 1 tablet (10 mg total) by mouth daily before breakfast. Bulah Alm RAMAN, PA-C  Active   gabapentin  (NEURONTIN ) 300 MG capsule [id number]  Take 2 capsules (600 mg total) by mouth 3 (three) times daily. For back and foot pain. Early, Sara E, NP  Active            Med Note [name]   Thu Aug 23, 2023  1:59 PM) 2 times twice a day  glucose blood (ONETOUCH VERIO) test strip [id number]  Use to check blood glucose 3 times daily  Patient not taking: Reported on 11/06/2023     Active Self, Pharmacy Records  HYDROcodone -acetaminophen  (NORCO/VICODIN) 5-325 MG tablet [id number]  Take 1-2 tablets every morning, 1 tablet at noon and 1 tablet at bedtime (DNF 07/01/23)   Active   HYDROcodone -acetaminophen  (NORCO/VICODIN) 5-325 MG tablet [id number]  Take 1-2 tablets by mouth every morning AND 1 tablet daily at 12 noon AND 1 tablet at bedtime.   Active   HYDROcodone -acetaminophen  (NORCO/VICODIN) 5-325 MG tablet [id number]  Take 1-2 tablets by mouth every morning AND 1 tablet daily at 12 noon AND 1 tablet at bedtime.   Active   HYDROcodone -acetaminophen  (NORCO/VICODIN) 5-325 MG tablet [id number]  Take 1-2 tablets by mouth every morning AND 1 tablet daily at 12 noon AND 1 tablet at bedtime.   Active   HYDROcodone -acetaminophen  (NORCO/VICODIN) 5-325 MG tablet [id number]  Take 1 to 2 tablets by mouth every morning, 1 tablet at noon, and 1 tablet every night at bedtime.   Active   HYDROcodone -acetaminophen  (NORCO/VICODIN) 5-325 MG  tablet [id number]  Take 1 to 2 tablets by mouth every morning, 1 tablet at noon, and 1 tablet every night at bedtime.   Active   insulin  aspart (NOVOLOG ) 100 UNIT/ML injection [id number]  Inject 0-9 Units into the skin 3 (three) times daily before meals. Sliding scale CBG 70-120 0 UNITS CBG 121-150 1 UNIT CBG 151-200 2 UNITS CGB 201-250 3 UNITS CGB 251-300 5 UNITS CGB 301-350 7 UNITS CGB 351-400 9 UNITS HIGHER TANH 400 CALL DOCTOR Early, Camie BRAVO, NP  Active   insulin  glargine, 1 Unit Dial , (TOUJEO  SOLOSTAR) 300 UNIT/ML Solostar Pen [id number]  Inject 12 Units into the skin daily at 6 (six) AM. Early, Camie BRAVO, NP  Active   metFORMIN  (GLUCOPHAGE -XR) 500 MG 24 hr tablet [id number]  Take 2 tablets (1,000 mg total) by mouth 2 (two) times daily. Early, Sara E, NP  Active   ondansetron  (ZOFRAN ) 4 MG tablet [id number]  Take  1 tablet (4 mg total) by mouth every 8 (eight) hours as needed for nausea or vomiting. Early, Sara E, NP  Active   pantoprazole  (PROTONIX ) 40 MG tablet [id number]  TAKE 1 TABLET DAILY (REPLACES OMEPRAZOLE ) Early, Camie BRAVO, NP  Active   rosuvastatin  (CRESTOR ) 20 MG tablet [id number]  TAKE 1 TABLET DAILY [place], [name], MD  Active   Testosterone  20.25 MG/ACT (1.62%) GEL [id number]  Place 3 Applications onto the skin daily. Early, Sara E, NP  Active   valsartan  (DIOVAN ) 80 MG tablet [id number]  Take 1 tablet (80 mg total) by mouth daily. [place], [name], MD  Active   zolpidem  (AMBIEN ) 10 MG tablet [id number]  Take 1 tablet (10 mg total) by mouth at bedtime as needed for sleep. Oris Camie BRAVO, NP  Active               Assessment/Plan:   Diabetes: - Currently uncontrolled but improving - Reviewed long term cardiovascular and renal outcomes of uncontrolled blood sugar - Reviewed goal A1c, goal fasting, and goal 2 hour post prandial glucose - Reviewed dietary modifications including low carb diet: -Continue current medication regimen. Make sure only taking Farxiga OR Jardiance . NOT BOTH. Scheduled  in office appt to bring all meds in office for review.     Follow Up Plan: 12/13/23  Jon VEAR [place], PharmD Clinical Pharmacist [phone]

## 2023-12-07 MED ORDER — BREZTRI AEROSPHERE 160-9-4.8 MCG/ACT IN AERO: 2.0000 | INHALATION_SPRAY | Freq: Two times a day (BID) | RESPIRATORY_TRACT | 1 refills | Status: AC

## 2023-12-07 NOTE — Addendum Note (Signed)
 Addended by: LIONELL JON DEL on: 12/07/2023 02:57 PM   Modules accepted: Orders

## 2023-12-13 ENCOUNTER — Other Ambulatory Visit (INDEPENDENT_AMBULATORY_CARE_PROVIDER_SITE_OTHER)

## 2023-12-13 ENCOUNTER — Ambulatory Visit (INDEPENDENT_AMBULATORY_CARE_PROVIDER_SITE_OTHER)

## 2023-12-13 DIAGNOSIS — E118 Type 2 diabetes mellitus with unspecified complications: Secondary | ICD-10-CM

## 2023-12-13 DIAGNOSIS — E291 Testicular hypofunction: Secondary | ICD-10-CM | POA: Diagnosis not present

## 2023-12-13 DIAGNOSIS — E538 Deficiency of other specified B group vitamins: Secondary | ICD-10-CM | POA: Diagnosis not present

## 2023-12-13 MED ORDER — CYANOCOBALAMIN 1000 MCG/ML IJ SOLN
1000.0000 ug | Freq: Once | INTRAMUSCULAR | Status: AC
  Administered 2023-12-13: 1000 ug via INTRAMUSCULAR

## 2023-12-13 NOTE — Progress Notes (Signed)
 12/13/2023 Name: Paul [name] MRN: [id number] DOB: May 24, 1950  Chief Complaint  Patient presents with   Medication Management    Paul [name] is a 73 y.o. year old male who presented for a telephone visit.   They were referred to the pharmacist by their PCP for assistance in managing complex medication management.    Subjective:  Care Team: Primary Care Provider: Oris Camie BRAVO, [name] ; Next Scheduled Visit: 02/08/24  Medication Access/Adherence  Current Pharmacy:  [facility] - [facility] [address], [address] [place] [address] Phone: [phone] Fax: [phone]  [facility] - [place], [place] - [address] [place] [address] Phone: [phone] Fax: [phone]  [facility] - [place], [place] - [address] [place] [address] [place] [address] Phone: [phone] Fax: [phone]   Patient reports affordability concerns with their medications: No  Patient reports access/transportation concerns to their pharmacy: No  Patient reports adherence concerns with their medications:  No     Diabetes:  Current medications: Novolog  ss, Toujeo  12 units, Metformin  XR 1000mg  BID, Jardiance  10mg  Medications tried in the past: Glipizide, Ozempic (N/V), Farxiga (frequent nighttime urination)  Current glucose readings:    Patient denies hypoglycemic s/sx including dizziness, shakiness, sweating. Patient denies hyperglycemic symptoms including polyuria, polydipsia, polyphagia, nocturia, neuropathy, blurred vision   Patient's sugars were responding well to ozempic, no more lows. However, patient has stopped ozempic due to nausea and vomiting despite zofran . Does not wish to continue taking.  Patient brings in [place] and Jardiance  bottles. Farxiga had been discontinued earlier this year and replaced with Jardiance  to see if it would be better tolerated (complained of frequent urination).  Patient reports he has only been taking farxiga in the last 6 weeks and not jardiance  too. Does report he has been urinating more frequently and would to go back to just jardiance .  PAP jardiance  approved 08/27/23.  Testosterone  Deficiency/Fatigue -Patient feels low energy and thinks testosterone  dose could be higher. Still feeling fatigued. Asks for B12 shot today. -Still taking metoprolol  despite being d/c by cardio earlier in year  Objective:  Lab Results  Component Value Date   HGBA1C 7.8 (H) 11/06/2023    Lab Results  Component Value Date   CREATININE 1.16 11/06/2023   BUN 27 11/06/2023   NA 140 11/06/2023   K 4.9 11/06/2023   CL 104 11/06/2023   CO2 18 (L) 11/06/2023    Lab Results  Component Value Date   CHOL 65 (L) 11/06/2023   HDL 23 (L) 11/06/2023   LDLCALC 17 11/06/2023   TRIG 143 11/06/2023   CHOLHDL 2.8 11/06/2023    Medications Reviewed Today     Reviewed by Paul [name], RPH (Pharmacist) on 12/13/23 at 1545  Med List Status: <None>   Medication Order Taking? Sig Documenting Provider Last Dose Status Informant  acetaminophen  (TYLENOL ) 500 MG tablet [id number]  Take 1,000 mg by mouth every 8 (eight) hours as needed for moderate pain (pain score 4-6). [provider]  Active Self, Pharmacy Records           Med Note ANGUS MAXWELL   Dju Mar 17, 2023  2:52 PM)    albuterol  (VENTOLIN  HFA) 108 [phone] Base) MCG/ACT inhaler [id number]  Inhale 2 puffs into the lungs every 4 (four) hours as needed. For breathing. Early, Paul [name]  Active     Discontinued 12/13/23 1455 (Completed Course)  aspirin  EC 81 MG tablet [id number] Yes Take 1 tablet (81 mg total) by mouth daily. Paul Camie BRAVO, [name]  Active   BD PEN NEEDLE NANO 2ND GEN 32G X 4 MM MISC [id number]  USE DAILY WITH INSULIN  AS DIRECTED Early, Paul [name]  Active   benzonatate  (TESSALON ) 100 MG capsule [id number]  Take 1 capsule (100 mg total) by mouth 2 (two) times daily as needed for cough.  Patient not  taking: Reported on 12/13/2023   Paul Paul RAMAN, PA-C  Active   budesonide -glycopyrrolate -formoterol  (BREZTRI  AEROSPHERE) 160-9-4.8 MCG/ACT AERO inhaler [id number]  Inhale 2 puffs into the lungs in the morning and at bedtime. Early, Paul [name]  Active   Continuous Glucose Receiver (FREESTYLE LIBRE 3 READER) DEVI [id number]  Use with freestyle libre 3 sensor Tysinger, Paul RAMAN, PA-C  Active Self, Pharmacy Records  Continuous Glucose Sensor (FREESTYLE LIBRE 3 SENSOR) Paul [name] [id number] Yes APPLY 1 SENSOR ON THE SKIN EVERY 14 DAYS, USE TO CHECK BLOOD GLUCOSE CONTINUOUSLY Early, Camie BRAVO, [name]  Active   dextromethorphan -guaiFENesin  (MUCINEX  DM) 30-600 MG 12hr tablet [id number]  Take 2 tablets in the morning as needed for cough.  Patient not taking: Reported on 11/27/2023   Early, Paul [name]  Active   empagliflozin  (JARDIANCE ) 10 MG TABS tablet [id number] Yes Take 1 tablet (10 mg total) by mouth daily before breakfast. Paul Paul RAMAN, PA-C  Active   gabapentin  (NEURONTIN ) 300 MG capsule [id number] Yes Take 2 capsules (600 mg total) by mouth 3 (three) times daily. For back and foot pain. Early, Paul [name]  Active            Med Note JANEAN JON [name]   Thu Aug 23, 2023  1:59 PM) 2 times twice a day  glucose blood (ONETOUCH VERIO) test strip [id number]  Use to check blood glucose 3 times daily  Patient not taking: Reported on 11/06/2023     Active Self, Pharmacy Records  HYDROcodone -acetaminophen  (NORCO/VICODIN) 5-325 MG tablet [id number] Yes Take 1-2 tablets every morning, 1 tablet at noon and 1 tablet at bedtime (DNF 07/01/23)   Active   HYDROcodone -acetaminophen  (NORCO/VICODIN) 5-325 MG tablet [id number] Yes Take 1-2 tablets by mouth every morning AND 1 tablet daily at 12 noon AND 1 tablet at bedtime.   Active   HYDROcodone -acetaminophen  (NORCO/VICODIN) 5-325 MG tablet [id number] Yes Take 1-2 tablets by mouth every morning AND 1 tablet daily at 12 noon AND 1 tablet at bedtime.   Active   HYDROcodone -acetaminophen   (NORCO/VICODIN) 5-325 MG tablet [id number] Yes Take 1-2 tablets by mouth every morning AND 1 tablet daily at 12 noon AND 1 tablet at bedtime.   Active   HYDROcodone -acetaminophen  (NORCO/VICODIN) 5-325 MG tablet [id number] Yes Take 1 to 2 tablets by mouth every morning, 1 tablet at noon, and 1 tablet every night at bedtime.   Active   HYDROcodone -acetaminophen  (NORCO/VICODIN) 5-325 MG tablet [id number] Yes Take 1 to 2 tablets by mouth every morning, 1 tablet at noon, and 1 tablet every night at bedtime.   Active   insulin  aspart (NOVOLOG ) 100 UNIT/ML injection [id number] Yes Inject 0-9 Units into the skin 3 (three) times daily before meals. Sliding scale CBG 70-120 0 UNITS CBG 121-150 1 UNIT CBG 151-200 2 UNITS CGB 201-250 3 UNITS CGB 251-300 5 UNITS CGB 301-350 7 UNITS CGB 351-400 9 UNITS HIGHER TANH 400 CALL DOCTOR Early, Camie BRAVO, [name]  Active   insulin  glargine, 1 Unit Dial , (TOUJEO  SOLOSTAR) 300 UNIT/ML Solostar Pen [id number] Yes Inject 12  Units into the skin daily at 6 (six) AM. Early, Camie BRAVO, [name]  Active   metFORMIN  (GLUCOPHAGE -XR) 500 MG 24 hr tablet [id number] Yes Take 2 tablets (1,000 mg total) by mouth 2 (two) times daily. Early, Paul [name]  Active   ondansetron  (ZOFRAN ) 4 MG tablet [id number]  Take 1 tablet (4 mg total) by mouth every 8 (eight) hours as needed for nausea or vomiting. Early, Paul [name]  Active   pantoprazole  (PROTONIX ) 40 MG tablet [id number] Yes TAKE 1 TABLET DAILY (REPLACES OMEPRAZOLE ) Early, Camie BRAVO, [name]  Active   rosuvastatin  (CRESTOR ) 20 MG tablet [id number] Yes TAKE 1 TABLET DAILY [place], [name], MD  Active   Testosterone  20.25 MG/ACT (1.62%) GEL [id number] Yes Place 3 Applications onto the skin daily. Early, Paul [name]  Active   valsartan  (DIOVAN ) 80 MG tablet [id number] Yes Take 1 tablet (80 mg total) by mouth daily. [place], [name], MD  Active   zolpidem  (AMBIEN ) 10 MG tablet [id number] Yes Take 1 tablet (10 mg total) by mouth at bedtime as needed for sleep. Paul Camie BRAVO, [name]  Active                Assessment/Plan:   Diabetes: - Currently uncontrolled but improving - Reviewed long term cardiovascular and renal outcomes of uncontrolled blood sugar - Reviewed goal A1c, goal fasting, and goal 2 hour post prandial glucose - Reviewed dietary modifications including low carb diet: -Continue current medication regimen. Resume Jardiance . Do NOT take farxiga.  Testosterone  Deficiency/Fatigue -Stop taking metoprolol  as previously instructed by cardio -Will reach out to PCP to see if she would like to increase testosterone  dose     Follow Up Plan: 01/10/24  Jon VEAR Lindau, PharmD Clinical Pharmacist [phone]

## 2023-12-14 ENCOUNTER — Telehealth: Payer: Self-pay

## 2023-12-14 NOTE — Telephone Encounter (Signed)
 Gave [facility] a call to follow up on Farxiga,pt keeps receiving it, this medication was d/c, spoke with a representative at [facility] cancel the medication pt will no longer received any as of today. Gave [facility] a call to follow up on when is pt's next refill due on Jardiance  ,spoke with a representative she was able to set up for pt to received on the mail(Jardiance )today,pt will received it with in 10-14 days from now.

## 2023-12-19 ENCOUNTER — Other Ambulatory Visit: Payer: Self-pay | Admitting: Nurse Practitioner

## 2023-12-19 ENCOUNTER — Other Ambulatory Visit (HOSPITAL_COMMUNITY): Payer: Self-pay

## 2023-12-19 DIAGNOSIS — E291 Testicular hypofunction: Secondary | ICD-10-CM

## 2023-12-19 MED ORDER — TESTOSTERONE 20.25 MG/ACT (1.62%) TD GEL
4.0000 | Freq: Every day | TRANSDERMAL | 0 refills | Status: DC
  Filled 2023-12-19: qty 75, fill #0

## 2023-12-19 MED ORDER — TESTOSTERONE 20.25 MG/ACT (1.62%) TD GEL
4.0000 | Freq: Every day | TRANSDERMAL | 0 refills | Status: DC
  Filled 2023-12-19: qty 150, 30d supply, fill #0

## 2023-12-24 ENCOUNTER — Other Ambulatory Visit (HOSPITAL_COMMUNITY): Payer: Self-pay

## 2023-12-24 MED ORDER — HYDROCODONE-ACETAMINOPHEN 5-325 MG PO TABS
ORAL_TABLET | ORAL | 0 refills | Status: DC
  Filled 2023-12-27: qty 120, 30d supply, fill #0

## 2023-12-24 MED ORDER — HYDROCODONE-ACETAMINOPHEN 5-325 MG PO TABS
ORAL_TABLET | ORAL | 0 refills | Status: DC
  Filled 2024-01-25: qty 120, 30d supply, fill #0

## 2023-12-24 MED ORDER — HYDROCODONE-ACETAMINOPHEN 5-325 MG PO TABS
ORAL_TABLET | ORAL | 0 refills | Status: DC
  Filled 2024-02-25: qty 120, 90d supply, fill #0

## 2023-12-25 ENCOUNTER — Encounter: Admitting: Nurse Practitioner

## 2023-12-27 ENCOUNTER — Other Ambulatory Visit (HOSPITAL_COMMUNITY): Payer: Self-pay

## 2023-12-28 ENCOUNTER — Telehealth: Payer: Self-pay

## 2023-12-28 ENCOUNTER — Other Ambulatory Visit: Payer: Self-pay

## 2023-12-28 MED ORDER — FREESTYLE LIBRE 3 PLUS SENSOR MISC: 1 refills | Status: AC

## 2023-12-28 NOTE — Telephone Encounter (Signed)
 Freestyle herlene has been changed to the freestyle libre plus 3 since the other one has been d/c pt is aware.   Copied from CRM #[id number]. Topic: Clinical - Prescription Issue >> Dec 28, 2023 11:17 AM Debby BROCKS wrote: Reason for CRM: Gilda from [employer] is called in regards to: Continuous Glucose Sensor (FREESTYLE LIBRE 3 SENSOR) MISC  They state that they sent over a fax that this medication for 14 days is discontinued but the alternative that is available is a 15 day sensor kit  [phone] Ref# [id number] Fax# [phone]

## 2024-01-10 ENCOUNTER — Other Ambulatory Visit (INDEPENDENT_AMBULATORY_CARE_PROVIDER_SITE_OTHER)

## 2024-01-10 DIAGNOSIS — E118 Type 2 diabetes mellitus with unspecified complications: Secondary | ICD-10-CM

## 2024-01-10 NOTE — Progress Notes (Signed)
 01/10/2024 Name: [name] MRN: [id number] DOB: 10-11-1950  Chief Complaint  Patient presents with   Medication Management   Diabetes    Paul [name] is a 73 y.o. year old male who presented for a telephone visit.   They were referred to the pharmacist by their PCP for assistance in managing complex medication management.    Subjective:  Care Team: Primary Care Provider: Oris Camie BRAVO, NP ; Next Scheduled Visit: 02/08/24  Medication Access/Adherence  Current Pharmacy:  [facility] - [facility] [address], [address] [place] [address] Phone: [phone] Fax: [phone]  [facility] - [place], [place] - [address] [place] [address] Phone: [phone] Fax: [phone]  [facility] - [place], [place] - [address] [place] [address] [place] [address] Phone: [phone] Fax: [phone]   Patient reports affordability concerns with their medications: No  Patient reports access/transportation concerns to their pharmacy: No  Patient reports adherence concerns with their medications:  No     Diabetes:  Current medications: Novolog  ss, Toujeo  12 units, Metformin  XR 1000mg  BID, Jardiance  10mg  Medications tried in the past: Glipizide, Ozempic (N/V), Farxiga (frequent nighttime urination)  Current glucose readings:    Patient denies hypoglycemic s/sx including dizziness, shakiness, sweating. Patient denies hyperglycemic symptoms including polyuria, polydipsia, polyphagia, nocturia, neuropathy, blurred vision     Objective:  Lab Results  Component Value Date   HGBA1C 7.8 (H) 11/06/2023    Lab Results  Component Value Date   CREATININE 1.16 11/06/2023   BUN 27 11/06/2023   NA 140 11/06/2023   K 4.9 11/06/2023   CL 104 11/06/2023   CO2 18 (L) 11/06/2023    Lab Results  Component Value Date   CHOL 65 (L) 11/06/2023   HDL 23 (L) 11/06/2023   LDLCALC 17 11/06/2023    TRIG 143 11/06/2023   CHOLHDL 2.8 11/06/2023    Medications Reviewed Today     Reviewed by Lionell Jon DEL, RPH (Pharmacist) on 01/10/24 at 1346  Med List Status: <None>   Medication Order Taking? Sig Documenting Provider Last Dose Status Informant  acetaminophen  (TYLENOL ) 500 MG tablet [id number]  Take 1,000 mg by mouth every 8 (eight) hours as needed for moderate pain (pain score 4-6). [provider]  Active Self, Pharmacy Records           Med Note ANGUS MAXWELL   Dju Mar 17, 2023  2:52 PM)    albuterol  (VENTOLIN  HFA) 108 [phone] Base) MCG/ACT inhaler [id number]  Inhale 2 puffs into the lungs every 4 (four) hours as needed. For breathing. Early, Sara E, NP  Active   aspirin  EC 81 MG tablet [id number]  Take 1 tablet (81 mg total) by mouth daily. Oris Camie BRAVO, NP  Active   BD PEN NEEDLE NANO 2ND GEN 32G X 4 MM MISC [id number]  USE DAILY WITH INSULIN  AS DIRECTED Early, Sara E, NP  Active   benzonatate  (TESSALON ) 100 MG capsule [id number]  Take 1 capsule (100 mg total) by mouth 2 (two) times daily as needed for cough.  Patient not taking: Reported on 12/13/2023   Bulah Alm RAMAN, PA-C  Active   budesonide -glycopyrrolate -formoterol  (BREZTRI  AEROSPHERE) 160-9-4.8 MCG/ACT AERO inhaler [id number]  Inhale 2 puffs into the lungs in the morning and at bedtime. Early, Sara E, NP  Active   Continuous Glucose Receiver (FREESTYLE LIBRE 3 READER) DEVI [id number]  Use with freestyle herlene  3 sensor Tysinger, Alm RAMAN, PA-C  Active Self, Pharmacy Records  Continuous Glucose Sensor (FREESTYLE LIBRE 3 PLUS SENSOR) MISC [id number]  Change sensor every 15 days. Early, Sara E, NP  Active   Continuous Glucose Sensor (FREESTYLE LIBRE 3 SENSOR) MISC [id number]  APPLY 1 SENSOR ON THE SKIN EVERY 14 DAYS, USE TO CHECK BLOOD GLUCOSE CONTINUOUSLY Early, Sara E, NP  Active   dextromethorphan -guaiFENesin  (MUCINEX  DM) 30-600 MG 12hr tablet [id number]  Take 2 tablets in the morning as needed for cough.  Patient not taking:  Reported on 11/27/2023   Early, Sara E, NP  Active   empagliflozin  (JARDIANCE ) 10 MG TABS tablet [id number]  Take 1 tablet (10 mg total) by mouth daily before breakfast. Tysinger, Alm RAMAN, PA-C  Active   gabapentin  (NEURONTIN ) 300 MG capsule [id number]  Take 2 capsules (600 mg total) by mouth 3 (three) times daily. For back and foot pain. Early, Sara E, NP  Active            Med Note [name]   Thu Aug 23, 2023  1:59 PM) 2 times twice a day  glucose blood (ONETOUCH VERIO) test strip [id number]  Use to check blood glucose 3 times daily  Patient not taking: Reported on 11/06/2023     Active Self, Pharmacy Records  HYDROcodone -acetaminophen  (NORCO/VICODIN) 5-325 MG tablet [id number]  Take 1-2 tablets by mouth every morning AND 1 tablet daily at 12 noon AND 1 tablet at bedtime.   Active   HYDROcodone -acetaminophen  (NORCO/VICODIN) 5-325 MG tablet [id number]  Take 1-2 tablets by mouth every morning, THEN 1 tablet at noon, THEN 1 tablet at bedtime. (02/24/24)   Active   HYDROcodone -acetaminophen  (NORCO/VICODIN) 5-325 MG tablet [id number]  Take 1-2 tablets by mouth every morning, THEN 1 tablet at noon, THEN 1 tablet at bedtime. (01/25/24)   Active   insulin  aspart (NOVOLOG ) 100 UNIT/ML injection [id number]  Inject 0-9 Units into the skin 3 (three) times daily before meals. Sliding scale CBG 70-120 0 UNITS CBG 121-150 1 UNIT CBG 151-200 2 UNITS CGB 201-250 3 UNITS CGB 251-300 5 UNITS CGB 301-350 7 UNITS CGB 351-400 9 UNITS HIGHER TANH 400 CALL DOCTOR Early, Camie BRAVO, NP  Active   insulin  glargine, 1 Unit Dial , (TOUJEO  SOLOSTAR) 300 UNIT/ML Solostar Pen [id number]  Inject 12 Units into the skin daily at 6 (six) AM. Early, Camie BRAVO, NP  Active   metFORMIN  (GLUCOPHAGE -XR) 500 MG 24 hr tablet [id number]  Take 2 tablets (1,000 mg total) by mouth 2 (two) times daily. Early, Sara E, NP  Active   ondansetron  (ZOFRAN ) 4 MG tablet [id number]  Take 1 tablet (4 mg total) by mouth every 8 (eight) hours as needed for nausea or  vomiting. Early, Sara E, NP  Active   pantoprazole  (PROTONIX ) 40 MG tablet [id number]  TAKE 1 TABLET DAILY (REPLACES OMEPRAZOLE ) Early, Sara E, NP  Active   rosuvastatin  (CRESTOR ) 20 MG tablet [id number]  TAKE 1 TABLET DAILY [place], [name], MD  Active   Testosterone  20.25 MG/ACT (1.62%) GEL [id number]  Place 4 pumps onto the skin daily. Early, Sara E, NP  Active   valsartan  (DIOVAN ) 80 MG tablet [id number]  Take 1 tablet (80 mg total) by mouth daily. [place], [name], MD  Active   zolpidem  (AMBIEN ) 10 MG tablet [id number]  Take 1 tablet (10 mg total) by mouth at bedtime as needed for sleep. Early, Sara E, NP  Active  Assessment/Plan:   Diabetes: - Currently uncontrolled but improving - Reviewed long term cardiovascular and renal outcomes of uncontrolled blood sugar - Reviewed goal A1c, goal fasting, and goal 2 hour post prandial glucose - Reviewed dietary modifications including low carb diet: -Continue current medication regimen. Consider dose increase in Toujeo  to 14 units if A1C still >7 at next check  Patient does complain of leg cramps and feeling like his legs are getting weaker. Reports this is not new but feels it is getting worse lately. Scheduled acute office visit for Monday to discuss.       Follow Up Plan: 02/28/24  Jon VEAR Lindau, PharmD Clinical Pharmacist [phone]

## 2024-01-14 ENCOUNTER — Ambulatory Visit: Admitting: Medical

## 2024-01-14 VITALS — BP 112/74 | HR 61 | Temp 97.8°F | Wt 174.8 lb

## 2024-01-14 DIAGNOSIS — Z9989 Dependence on other enabling machines and devices: Secondary | ICD-10-CM

## 2024-01-14 DIAGNOSIS — Z23 Encounter for immunization: Secondary | ICD-10-CM | POA: Diagnosis not present

## 2024-01-14 DIAGNOSIS — I8393 Asymptomatic varicose veins of bilateral lower extremities: Secondary | ICD-10-CM | POA: Diagnosis not present

## 2024-01-14 DIAGNOSIS — M79604 Pain in right leg: Secondary | ICD-10-CM

## 2024-01-14 DIAGNOSIS — I739 Peripheral vascular disease, unspecified: Secondary | ICD-10-CM | POA: Diagnosis not present

## 2024-01-14 DIAGNOSIS — R252 Cramp and spasm: Secondary | ICD-10-CM | POA: Diagnosis not present

## 2024-01-14 DIAGNOSIS — M79605 Pain in left leg: Secondary | ICD-10-CM | POA: Diagnosis not present

## 2024-01-14 NOTE — Patient Instructions (Signed)
 Leg cramps and muscle pain Chronic, severe, bilateral leg cramps and muscle pain, possibly due to dehydration, medication side effects, or varicose veins. Other differential includes statin-induced myopathy and peripheral vascular disease. - Increase water  intake to 80-100 ounces daily. - Try a teaspoon of mustard once or twice daily. - Stop rosuvastatin /Crestor  for 1-2 weeks to assess symptom improvement. - Order repeat blood flow study of the legs.  Difficulty walking Difficulty walking due to leg pain, relieved by rest. - Order repeat blood flow study of the legs.  Varicose veins of lower extremities -consider compression socks or hose daily  Hyperlipidemia Hyperlipidemia managed with rosuvastatin . Evaluating muscle pain as a potential side effect by temporarily discontinuing the medication. - Stop rosuvastatin  for 1-2 weeks to assess symptom improvement. -if the symptoms resolve quickly with stopping the cholesterol medication, we may have to consider a lower dose or a different medication

## 2024-01-14 NOTE — Progress Notes (Signed)
 Name: Paul [name]   Date of Visit: 01/14/24   Date of last visit with me: 11/27/2023   CHIEF COMPLAINT:  Chief Complaint  Patient presents with   Acute Visit    Cramps in legs and feet mostly at night.        HPI:  Discussed the use of AI scribe software for clinical note transcription with the patient, who gave verbal consent to proceed.  History of Present Illness   Paul [name] is a 73 year old male with diabetes who presents with severe leg cramps.  He has been experiencing severe cramps in his legs and back muscles for several months. The cramps are described as 'bad, bad cramps' and occur in both legs, exacerbated by walking and relieved by rest. He struggles with walking due to the pain.  His water  intake is low, primarily consuming iced tea. He has not experienced any recent falls or injuries. He is not on any new medications in the past month or two and does not wear compression hose.  His current medications include a daily B12 pill, monthly B12 shots, and rosuvastatin  for cholesterol management. He has been taking potassium supplements for several months. He is unsure if he is still taking Jardiance .  He has been on other cholesterol medicine in the past but does not recall a specific side effect  His last blood work in July 2025 showed normal potassium and sodium levels.  Most recent Hgba1C 7.8% in 11/06/23.   A blood flow study of his legs was performed in 2023 and was normal at that time.  He has varicose veins in his legs and reports that his legs hurt when he walks, particularly in certain parts, but the pain goes away with rest..  He does not want to use compression hose but may opt for compression socks  No other aggravating or relieving factors. No other complaint.  Past Medical History:  Diagnosis Date   Angina, class III    Anxiety    Arthritis    Bilateral carotid artery stenosis    BPH without obstruction/lower urinary tract symptoms    CAD 11/24/2011    a) Mild-to-moderate 30-40% lesions in the RCA, LAD and Circumflex. b) CULPRIT LESION: long tubular 70-80% lesion in D1 with FFR of 0.7 --> PCI w/ Xience Xpedition DES 2.5 mm x 30 mm (2.65 MM); c) Lexiscan  Myoview  11/2013: No Ischemia or Infarct (Inferior Gut Attenuation) EF 63%.   Chronic fatigue syndrome 06/13/2021   Chronic heart failure (HCC)    Chronic kidney disease (CKD), stage III (moderate) (HCC)    Chronic pain syndrome    Cirrhosis (HCC)    Constipation by delayed colonic transit 06/13/2021   Depression    Diverticulosis    Dysphagia    Elevated alkaline phosphatase level 08/17/2022   Elevated levels of transaminase & lactic acid dehydrogenase 06/09/2016   Exertional dyspnea, chronic    Fall 02/16/2023   Former smoker    GERD (gastroesophageal reflux disease)    Gout    H/O: GI bleed    Hearing loss    Hypo-osmolality and hyponatremia 03/11/2019   Hypoglycemia 03/18/2023   Hypotension due to drugs 10/09/2022   Iron  deficiency anemia    Lipoprotein deficiency disorder    Long term (current) use of insulin  (HCC) 07/15/2019   Malnutrition of moderate degree 03/19/2023   Obesity (BMI 30.0-34.9)    OSA (obstructive sleep apnea), uses oxygen  at home did not tolerate cpap 11/24/2011   Pain due  to onychomycosis of toenails of both feet 08/23/2022   Pain in both lower extremities 02/14/2022   Paresthesia of skin 02/17/2015   Peripheral vascular disease    Portal hypertension (HCC)    Right hip pain 06/13/2021   S/P CABG (coronary artery bypass graft)    Shoulder joint pain 02/17/2015   Spinal stenosis    Splenomegaly    Thrombocytopenia    Vitamin D  deficiency    Current Outpatient Medications on File Prior to Visit  Medication Sig Dispense Refill   acetaminophen  (TYLENOL ) 500 MG tablet Take 1,000 mg by mouth every 8 (eight) hours as needed for moderate pain (pain score 4-6).     albuterol  (VENTOLIN  HFA) 108 (90 Base) MCG/ACT inhaler Inhale 2 puffs into the lungs every  4 (four) hours as needed. For breathing. 6.7 g 3   aspirin  EC 81 MG tablet Take 1 tablet (81 mg total) by mouth daily. 90 tablet 3   budesonide -glycopyrrolate -formoterol  (BREZTRI  AEROSPHERE) 160-9-4.8 MCG/ACT AERO inhaler Inhale 2 puffs into the lungs in the morning and at bedtime. 32.1 g 1   Continuous Glucose Receiver (FREESTYLE LIBRE 3 READER) DEVI Use with freestyle libre 3 sensor 1 each 1   Continuous Glucose Sensor (FREESTYLE LIBRE 3 PLUS SENSOR) MISC Change sensor every 15 days. 6 each 1   dextromethorphan -guaiFENesin  (MUCINEX  DM) 30-600 MG 12hr tablet Take 2 tablets in the morning as needed for cough. 30 tablet 0   empagliflozin  (JARDIANCE ) 10 MG TABS tablet Take 1 tablet (10 mg total) by mouth daily before breakfast. 30 tablet 2   gabapentin  (NEURONTIN ) 300 MG capsule Take 2 capsules (600 mg total) by mouth 3 (three) times daily. For back and foot pain. 540 capsule 3   HYDROcodone -acetaminophen  (NORCO/VICODIN) 5-325 MG tablet Take 1-2 tablets by mouth every morning, THEN 1 tablet at noon, THEN 1 tablet at bedtime. (01/25/24) 120 tablet 0   insulin  aspart (NOVOLOG ) 100 UNIT/ML injection Inject 0-9 Units into the skin 3 (three) times daily before meals. Sliding scale CBG 70-120 0 UNITS CBG 121-150 1 UNIT CBG 151-200 2 UNITS CGB 201-250 3 UNITS CGB 251-300 5 UNITS CGB 301-350 7 UNITS CGB 351-400 9 UNITS HIGHER TANH 400 CALL DOCTOR 120 mL 3   insulin  glargine, 1 Unit Dial , (TOUJEO  SOLOSTAR) 300 UNIT/ML Solostar Pen Inject 12 Units into the skin daily at 6 (six) AM.     metFORMIN  (GLUCOPHAGE -XR) 500 MG 24 hr tablet Take 2 tablets (1,000 mg total) by mouth 2 (two) times daily. 360 tablet 3   ondansetron  (ZOFRAN ) 4 MG tablet Take 1 tablet (4 mg total) by mouth every 8 (eight) hours as needed for nausea or vomiting. 20 tablet 2   pantoprazole  (PROTONIX ) 40 MG tablet TAKE 1 TABLET DAILY (REPLACES OMEPRAZOLE ) 90 tablet 3   rosuvastatin  (CRESTOR ) 20 MG tablet TAKE 1 TABLET DAILY 90 tablet 3    Testosterone  20.25 MG/ACT (1.62%) GEL Place 4 pumps onto the skin daily. 150 g 0   valsartan  (DIOVAN ) 80 MG tablet Take 1 tablet (80 mg total) by mouth daily. 90 tablet 2   zolpidem  (AMBIEN ) 10 MG tablet Take 1 tablet (10 mg total) by mouth at bedtime as needed for sleep. 30 tablet 2   BD PEN NEEDLE NANO 2ND GEN 32G X 4 MM MISC USE DAILY WITH INSULIN  AS DIRECTED 90 each 3   glucose blood (ONETOUCH VERIO) test strip Use to check blood glucose 3 times daily (Patient not taking: Reported on 11/06/2023) 300 each 3   HYDROcodone -acetaminophen  (  NORCO/VICODIN) 5-325 MG tablet Take 1-2 tablets by mouth every morning AND 1 tablet daily at 12 noon AND 1 tablet at bedtime. 120 tablet 0   HYDROcodone -acetaminophen  (NORCO/VICODIN) 5-325 MG tablet Take 1-2 tablets by mouth every morning, THEN 1 tablet at noon, THEN 1 tablet at bedtime. (02/24/24) 120 tablet 0   No current facility-administered medications on file prior to visit.     Physical Exam General appearence: alert, no distress, WD/WN,  Heart: RRR, normal S1, S2, no murmurs Lungs: CTA bilaterally, no wheezes, rhonchi, or rales Pulses: 2+ symmetric, upper and 1+ lower extremities, normal cap refill Moderate varicose veins both lower legs No edema Legs nontender otherwise with normal ROM of ankles and feet   Results LABS Potassium: within normal limits (11/06/2023) Sodium: within normal limits (11/06/2023)   DIAGNOSTIC ABI Blood flow study of legs: Normal (2023)     Assessment and Plan Encounter Diagnoses  Name Primary?   Pain in both lower extremities Yes   Varicose veins of both lower extremities, unspecified whether complicated    Needs flu shot    Ambulates with cane    Intermittent claudication    Leg cramping     Leg cramps and muscle pain Chronic, severe, bilateral leg cramps and muscle pain, possibly due to dehydration, medication side effects, or varicose veins. Other differential includes statin-induced myopathy and  peripheral vascular disease. - Increase water  intake to 80-100 ounces daily. - Try a teaspoon of mustard once or twice daily. - Stop rosuvastatin /crestor  for 1-2 weeks to assess symptom improvement. - Order repeat blood flow study of the legs/ABIs  Difficulty walking Difficulty walking due to leg pain, relieved by rest. - Order repeat blood flow study of the legs.  Varicose veins of lower extremities -consider compression socks or hose daily  Hyperlipidemia Hyperlipidemia managed with rosuvastatin . Evaluating muscle pain as a potential side effect by temporarily discontinuing the medication. - Stop rosuvastatin  for 1-2 weeks to assess symptom improvement. -if the symptoms resolve quickly with stopping the cholesterol medication, we may have to consider a lower dose or a different medication  Type 2 diabetes mellitus Type 2 diabetes with A1c of 7.8 recently  Continue medications as usual  Counseled on the influenza virus vaccine.  Influenza vaccine given after consent obtained.    Akil was seen today for acute visit.  Diagnoses and all orders for this visit:  Pain in both lower extremities -     VAS US  ABI WITH/WO TBI; Future  Varicose veins of both lower extremities, unspecified whether complicated  Needs flu shot -     Flu vaccine HIGH DOSE PF(Fluzone Trivalent)  Ambulates with cane  Intermittent claudication -     VAS US  ABI WITH/WO TBI; Future  Leg cramping    F/u pending ABI

## 2024-01-22 ENCOUNTER — Encounter: Payer: Self-pay | Admitting: Nurse Practitioner

## 2024-01-22 DIAGNOSIS — E1151 Type 2 diabetes mellitus with diabetic peripheral angiopathy without gangrene: Secondary | ICD-10-CM | POA: Insufficient documentation

## 2024-01-22 DIAGNOSIS — Z794 Long term (current) use of insulin: Secondary | ICD-10-CM | POA: Insufficient documentation

## 2024-01-23 ENCOUNTER — Ambulatory Visit: Payer: Self-pay | Admitting: Medical

## 2024-01-23 ENCOUNTER — Ambulatory Visit (HOSPITAL_COMMUNITY)
Admission: RE | Admit: 2024-01-23 | Discharge: 2024-01-23 | Disposition: A | Discharge status: Home / Self Care | Source: Ambulatory Visit | Attending: Medical | Admitting: Medical

## 2024-01-23 ENCOUNTER — Telehealth: Payer: Self-pay

## 2024-01-23 DIAGNOSIS — M79604 Pain in right leg: Secondary | ICD-10-CM | POA: Diagnosis not present

## 2024-01-23 DIAGNOSIS — M79605 Pain in left leg: Secondary | ICD-10-CM | POA: Insufficient documentation

## 2024-01-23 DIAGNOSIS — I739 Peripheral vascular disease, unspecified: Secondary | ICD-10-CM | POA: Diagnosis not present

## 2024-01-23 LAB — VAS US ABI WITH/WO TBI
Left ABI: 1.09
Right ABI: 1.14

## 2024-01-23 NOTE — Telephone Encounter (Signed)
 Gave pt a call pt is coming up due for reenrollment AZ&ME Breztri ,left a HIPAA VM,will mail out pt portion and fax provider portion.

## 2024-01-23 NOTE — Progress Notes (Signed)
 Thankfully the ABI blood flow screen is normal

## 2024-01-25 ENCOUNTER — Other Ambulatory Visit (HOSPITAL_COMMUNITY): Payer: Self-pay

## 2024-01-28 ENCOUNTER — Other Ambulatory Visit: Payer: Self-pay | Admitting: Nurse Practitioner

## 2024-01-28 DIAGNOSIS — G4700 Insomnia, unspecified: Secondary | ICD-10-CM

## 2024-01-28 MED ORDER — ZOLPIDEM TARTRATE 10 MG PO TABS
10.0000 mg | ORAL_TABLET | Freq: Every evening | ORAL | 2 refills | Status: AC | PRN
Start: 2024-01-28 — End: ?

## 2024-01-28 NOTE — Telephone Encounter (Signed)
 Last apt 11/06/23

## 2024-01-28 NOTE — Telephone Encounter (Signed)
 Copied from CRM [id number]. Topic: Clinical - Medication Refill >> Jan 28, 2024  3:19 PM Donna E wrote: Medication: zolpidem  (AMBIEN ) 10 MG tablet   Has the patient contacted their pharmacy? No No refills on the bottle  This is the patient's preferred pharmacy:  [facility] - [place], [place] - [address] [place] [address] Phone: [phone] Fax: [phone]   Is this the correct pharmacy for this prescription? Yes If no, delete pharmacy and type the correct one.   Has the prescription been filled recently? Yes  Is the patient out of the medication? Yes  Has the patient been seen for an appointment in the last year OR does the patient have an upcoming appointment? Yes  Can we respond through MyChart? No  Agent: Please be advised that Rx refills may take up to 3 business days. We ask that you follow-up with your pharmacy.

## 2024-02-01 ENCOUNTER — Other Ambulatory Visit: Payer: Self-pay | Admitting: Medical

## 2024-02-01 ENCOUNTER — Telehealth: Payer: Self-pay | Admitting: Nurse Practitioner

## 2024-02-01 ENCOUNTER — Telehealth: Payer: Self-pay

## 2024-02-01 ENCOUNTER — Other Ambulatory Visit (HOSPITAL_COMMUNITY): Payer: Self-pay

## 2024-02-01 DIAGNOSIS — G4700 Insomnia, unspecified: Secondary | ICD-10-CM

## 2024-02-01 MED ORDER — ZOLPIDEM TARTRATE 10 MG PO TABS
10.0000 mg | ORAL_TABLET | Freq: Every evening | ORAL | 0 refills | Status: DC | PRN
  Filled 2024-02-01 (×2): qty 10, 10d supply, fill #0

## 2024-02-01 NOTE — Telephone Encounter (Signed)
 Copied from CRM #[id number]. Topic: Clinical - Prescription Issue >> Feb 01, 2024  1:35 PM Amy B wrote: Reason for CRM: Patient states he is out of his medication and was told it will take 5 days for him to receive it.  He was given a number for the office to call regarding this [phone] and was told that they needed to call to verify.  He would like to know if he can get a temorary refill locally to tie him over until he gets his medication shipped.  He requests a call back to discuss.

## 2024-02-01 NOTE — Telephone Encounter (Signed)
 Pt informed Shane sent in small supply of Ambien  to [facility] pharmacy to hold til mail order comes in

## 2024-02-01 NOTE — Telephone Encounter (Signed)
 Sarabeth sent in Ambien  to mail order pharmacy for pt but he will not received it until later on next week and wanting to know if he can get like a week supply sent in to [facility]

## 2024-02-07 ENCOUNTER — Ambulatory Visit

## 2024-02-08 ENCOUNTER — Ambulatory Visit: Admitting: Nurse Practitioner

## 2024-02-08 ENCOUNTER — Encounter: Payer: Self-pay | Admitting: Nurse Practitioner

## 2024-02-08 VITALS — BP 124/78 | HR 61 | Wt 177.4 lb

## 2024-02-08 DIAGNOSIS — J449 Chronic obstructive pulmonary disease, unspecified: Secondary | ICD-10-CM | POA: Diagnosis not present

## 2024-02-08 DIAGNOSIS — Z794 Long term (current) use of insulin: Secondary | ICD-10-CM | POA: Diagnosis not present

## 2024-02-08 DIAGNOSIS — G4733 Obstructive sleep apnea (adult) (pediatric): Secondary | ICD-10-CM | POA: Diagnosis not present

## 2024-02-08 DIAGNOSIS — I152 Hypertension secondary to endocrine disorders: Secondary | ICD-10-CM | POA: Diagnosis not present

## 2024-02-08 DIAGNOSIS — E118 Type 2 diabetes mellitus with unspecified complications: Secondary | ICD-10-CM | POA: Diagnosis not present

## 2024-02-08 DIAGNOSIS — E1142 Type 2 diabetes mellitus with diabetic polyneuropathy: Secondary | ICD-10-CM | POA: Diagnosis not present

## 2024-02-08 DIAGNOSIS — E1169 Type 2 diabetes mellitus with other specified complication: Secondary | ICD-10-CM

## 2024-02-08 DIAGNOSIS — E1159 Type 2 diabetes mellitus with other circulatory complications: Secondary | ICD-10-CM

## 2024-02-08 DIAGNOSIS — I739 Peripheral vascular disease, unspecified: Secondary | ICD-10-CM

## 2024-02-08 DIAGNOSIS — E1122 Type 2 diabetes mellitus with diabetic chronic kidney disease: Secondary | ICD-10-CM

## 2024-02-08 DIAGNOSIS — N183 Chronic kidney disease, stage 3 unspecified: Secondary | ICD-10-CM | POA: Diagnosis not present

## 2024-02-08 DIAGNOSIS — E538 Deficiency of other specified B group vitamins: Secondary | ICD-10-CM

## 2024-02-08 DIAGNOSIS — N1831 Chronic kidney disease, stage 3a: Secondary | ICD-10-CM | POA: Diagnosis not present

## 2024-02-08 DIAGNOSIS — G4700 Insomnia, unspecified: Secondary | ICD-10-CM

## 2024-02-08 DIAGNOSIS — I5032 Chronic diastolic (congestive) heart failure: Secondary | ICD-10-CM

## 2024-02-08 DIAGNOSIS — E785 Hyperlipidemia, unspecified: Secondary | ICD-10-CM | POA: Diagnosis not present

## 2024-02-08 MED ORDER — CYANOCOBALAMIN 1000 MCG/ML IJ SOLN
1000.0000 ug | Freq: Once | INTRAMUSCULAR | Status: AC
  Administered 2024-02-08: 1000 ug via INTRAMUSCULAR

## 2024-02-08 MED ORDER — ZOLPIDEM TARTRATE 10 MG PO TABS: 10.0000 mg | ORAL_TABLET | Freq: Every evening | ORAL | 0 refills | Status: DC | PRN

## 2024-02-08 NOTE — Progress Notes (Signed)
 Catheline Doing, DNP, AGNP-c [facility]  [address] [place], [place] [address] [phone]  ESTABLISHED PATIENT- Chronic Health and/or Follow-Up Visit on 02/08/2024  Blood pressure 124/78, pulse 61, weight 177 lb 6.4 oz (80.5 kg).   HPI: History of Present Illness Paul [name] is a 73 year old male who presents with leg pain and weakness.  He experiences significant pain and weakness in his legs, which has been progressively worsening. His legs often 'give out', and he struggles with walking, frequently needing to sit down to rest. Pain is absent when touched but occurs in the calves during walking. A vascular ultrasound was performed, focusing on his toes and ankles, but he is unsure if his veins were thoroughly evaluated. He also experiences shortness of breath with exertion.  He reports swelling and pain in his feet, particularly in the mornings, making it difficult to get out of bed. His feet are very sensitive and swollen, impacting his mobility.  He has a history of a skin procedure where a 'big old hump' was removed, which remains sensitive to touch even after two years. He avoids activities that might aggravate the area due to the pain.  He is currently taking Ambien  for sleep, which he finds effective, but he experienced issues with prescription refills. He also takes rosuvastatin  for cholesterol management, which he had stopped temporarily due to concerns about leg pain but has since resumed. He administers insulin  once daily, adjusting the dose based on his blood sugar levels, which he monitors regularly. He reports occasional high blood sugar levels, particularly after consuming sweets.  He mentions feeling generally low on energy and attributes some of this to a recent flu shot, which he believes made him feel unwell for about a week and a half. He also reports frequent urination, which he associates with increased fluid intake.  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 His daughter and her partner have moved to [place] , impacting his support system.  All ROS negative with exception of what is listed above.    PHYSICAL EXAM Physical Exam Vitals and nursing note reviewed.  Constitutional:      General: He is not in acute distress.    Appearance: Normal appearance. He is not ill-appearing or toxic-appearing.  HENT:     Head: Normocephalic.  Eyes:     Pupils: Pupils are equal, round, and reactive to light.  Cardiovascular:     Rate and Rhythm: Normal rate and regular rhythm.     Pulses: Normal pulses.     Heart sounds: Normal heart sounds.  Pulmonary:     Effort: Pulmonary effort is normal.     Breath sounds: Normal breath sounds.  Abdominal:     General: There is no distension.     Palpations: Abdomen is soft.     Tenderness: There is no abdominal tenderness. There is no guarding.  Musculoskeletal:        General: Normal range of motion.     Cervical back: Normal range of motion.  Skin:    General: Skin is warm.  Neurological:     General: No focal deficit present.     Mental Status: He is alert and oriented to person, place, and time.     Sensory: Sensory deficit present.     Motor: Weakness present.     Coordination: Coordination normal.     Gait: Gait abnormal.  Psychiatric:  Mood and Affect: Mood normal.     PLAN Problem List Items Addressed This Visit     Type 2 diabetes mellitus with stage 3a chronic kidney disease, with long-term current use of insulin  (HCC)   Relevant Medications   zolpidem  (AMBIEN ) 10 MG tablet (Start on 02/25/2024)   Other Relevant Orders   Microalbumin/Creatinine Ratio, Urine (Completed)   Ambulatory referral to Vascular Surgery   Ambulatory referral to Pulmonology   Hemoglobin A1c (Completed)   CBC with Differential/Platelet (Completed)   Comprehensive metabolic panel with GFR  (Completed)   Vitamin B12 (Completed)   Testosterone , Total, LC/MS/MS (Completed)   VITAMIN D  25 Hydroxy (Vit-D Deficiency, Fractures) (Completed)   TSH (Completed)   T4, free (Completed)   Type 2 diabetes mellitus with stage 3 chronic kidney disease, with long-term current use of insulin , unspecified whether stage 3a or 3b CKD (HCC)   Relevant Medications   zolpidem  (AMBIEN ) 10 MG tablet (Start on 02/25/2024)   Other Relevant Orders   Microalbumin/Creatinine Ratio, Urine (Completed)   Ambulatory referral to Vascular Surgery   Ambulatory referral to Pulmonology   Hemoglobin A1c (Completed)   CBC with Differential/Platelet (Completed)   Comprehensive metabolic panel with GFR (Completed)   Vitamin B12 (Completed)   Testosterone , Total, LC/MS/MS (Completed)   VITAMIN D  25 Hydroxy (Vit-D Deficiency, Fractures) (Completed)   TSH (Completed)   T4, free (Completed)   DM (diabetes mellitus), type 2 with complications (HCC)   Relevant Medications   zolpidem  (AMBIEN ) 10 MG tablet (Start on 02/25/2024)   Other Relevant Orders   Microalbumin/Creatinine Ratio, Urine (Completed)   Ambulatory referral to Vascular Surgery   Ambulatory referral to Pulmonology   Hemoglobin A1c (Completed)   CBC with Differential/Platelet (Completed)   Comprehensive metabolic panel with GFR (Completed)   Vitamin B12 (Completed)   Testosterone , Total, LC/MS/MS (Completed)   VITAMIN D  25 Hydroxy (Vit-D Deficiency, Fractures) (Completed)   TSH (Completed)   T4, free (Completed)   Diabetic polyneuropathy associated with type 2 diabetes mellitus (HCC)   Relevant Medications   zolpidem  (AMBIEN ) 10 MG tablet (Start on 02/25/2024)   Other Relevant Orders   Microalbumin/Creatinine Ratio, Urine (Completed)   Ambulatory referral to Vascular Surgery   Ambulatory referral to Pulmonology   Hemoglobin A1c (Completed)   CBC with Differential/Platelet (Completed)   Comprehensive metabolic panel with GFR (Completed)    Vitamin B12 (Completed)   Testosterone , Total, LC/MS/MS (Completed)   VITAMIN D  25 Hydroxy (Vit-D Deficiency, Fractures) (Completed)   TSH (Completed)   T4, free (Completed)   Persistent insomnia   Relevant Medications   zolpidem  (AMBIEN ) 10 MG tablet (Start on 02/25/2024)   Chronic heart failure with preserved ejection fraction (HFpEF) (HCC)   Relevant Medications   zolpidem  (AMBIEN ) 10 MG tablet (Start on 02/25/2024)   Other Relevant Orders   Microalbumin/Creatinine Ratio, Urine (Completed)   Ambulatory referral to Vascular Surgery   Ambulatory referral to Pulmonology   Hemoglobin A1c (Completed)   CBC with Differential/Platelet (Completed)   Comprehensive metabolic panel with GFR (Completed)   Vitamin B12 (Completed)   Testosterone , Total, LC/MS/MS (Completed)   VITAMIN D  25 Hydroxy (Vit-D Deficiency, Fractures) (Completed)   TSH (Completed)   T4, free (Completed)   B12 deficiency - Primary   Relevant Orders   Vitamin B12 (Completed)   Hyperlipidemia associated with type 2 diabetes mellitus (HCC)   Relevant Medications   zolpidem  (AMBIEN ) 10 MG tablet (Start on 02/25/2024)   Other Relevant Orders   Microalbumin/Creatinine Ratio, Urine (Completed)  Ambulatory referral to Vascular Surgery   Ambulatory referral to Pulmonology   Hemoglobin A1c (Completed)   CBC with Differential/Platelet (Completed)   Comprehensive metabolic panel with GFR (Completed)   Vitamin B12 (Completed)   Testosterone , Total, LC/MS/MS (Completed)   VITAMIN D  25 Hydroxy (Vit-D Deficiency, Fractures) (Completed)   TSH (Completed)   T4, free (Completed)   Hypertension associated with diabetes (HCC)   Relevant Medications   zolpidem  (AMBIEN ) 10 MG tablet (Start on 02/25/2024)   Other Relevant Orders   Microalbumin/Creatinine Ratio, Urine (Completed)   Ambulatory referral to Vascular Surgery   Ambulatory referral to Pulmonology   Hemoglobin A1c (Completed)   CBC with Differential/Platelet  (Completed)   Comprehensive metabolic panel with GFR (Completed)   Vitamin B12 (Completed)   Testosterone , Total, LC/MS/MS (Completed)   VITAMIN D  25 Hydroxy (Vit-D Deficiency, Fractures) (Completed)   TSH (Completed)   T4, free (Completed)   Claudication of calf muscles   Relevant Medications   zolpidem  (AMBIEN ) 10 MG tablet (Start on 02/25/2024)   Other Relevant Orders   Microalbumin/Creatinine Ratio, Urine (Completed)   Ambulatory referral to Vascular Surgery   Ambulatory referral to Pulmonology   Hemoglobin A1c (Completed)   CBC with Differential/Platelet (Completed)   Comprehensive metabolic panel with GFR (Completed)   Vitamin B12 (Completed)   Testosterone , Total, LC/MS/MS (Completed)   VITAMIN D  25 Hydroxy (Vit-D Deficiency, Fractures) (Completed)   TSH (Completed)   T4, free (Completed)   OSA and COPD overlap syndrome (HCC)   Relevant Medications   zolpidem  (AMBIEN ) 10 MG tablet (Start on 02/25/2024)   Other Relevant Orders   Microalbumin/Creatinine Ratio, Urine (Completed)   Ambulatory referral to Vascular Surgery   Ambulatory referral to Pulmonology   Hemoglobin A1c (Completed)   CBC with Differential/Platelet (Completed)   Comprehensive metabolic panel with GFR (Completed)   Vitamin B12 (Completed)   Testosterone , Total, LC/MS/MS (Completed)   VITAMIN D  25 Hydroxy (Vit-D Deficiency, Fractures) (Completed)   TSH (Completed)   T4, free (Completed)    Lower extremity claudication, etiology under evaluation Reports leg pain and weakness, particularly in the calves, with symptoms of claudication. No significant swelling or varicose veins. Previous vascular ultrasound limited to toes and ankles, results normal. Differential diagnosis includes venous insufficiency or arterial insufficiency. Recently had evaluation, but it does not appear that ultrasound evaluation was completed. Will resent referral.  - Refer to vascular surgeon for further evaluation of leg veins and  arteries - Check B12 level  Type 2 diabetes mellitus with diabetic polyneuropathy, chronic kidney disease, and circulatory complications Diabetes with comorbidiites of hypertension, hyperlipidemia, CHF, OSA, and CKD.  management includes insulin  therapy with good control, though occasional dietary indiscretions occur. Reports increased urination, but no significant issues with urination flow. Care management is monitoring closely, as well.  - Continue current insulin  regimen - Monitor blood glucose levels regularly  Vitamin B12 deficiency B12 deficiency is being monitored. Reports low energy levels, which may be related to B12 deficiency or other factors. - Check B12 level - Administer B12 injection  Insomnia on chronic hypnotic therapy Insomnia managed with Ambien , but issues with prescription refills due to controlled substance regulations. Reports dependency on medication for sleep. We discussed that the medication cannot be sent for extended periods at a time due to controlled substance laws. Close monitoring required. Recent report from insurance carrier shows increased risks with sedative sleep medication and chronic pain medication management. Will consider reduced dosing for ambien  given the significant risk of respiratory depression. Discussion  with patient of these risks.  - Send Ambien  prescription to pharmacy - Monitor closely for increased fatigue and signs of respiratory depression - Consider taper down of dosing due to high levels of chronic opiate therapy for pain management.   Return in about 3 months (around 05/10/2024) for Med Management 45.  SaraBeth Zayleigh Stroh, DNP, AGNP-c Time: 46 minutes, >50% spent counseling, care coordination, chart review, and documentation.  SABRAtime

## 2024-02-08 NOTE — Patient Instructions (Signed)
 I sent the referral to the blood vessel doctor and to the lung doctor.   Id like to see if the blood vessels are what is causing you so much pain.  The lung doctor may be able to help with your shortness of breath.

## 2024-02-11 LAB — COMPREHENSIVE METABOLIC PANEL WITH GFR
ALT: 26 IU/L (ref 0–44)
AST: 34 IU/L (ref 0–40)
Albumin: 4.4 g/dL (ref 3.8–4.8)
Alkaline Phosphatase: 104 IU/L (ref 47–123)
BUN/Creatinine Ratio: 19 (ref 10–24)
BUN: 23 mg/dL (ref 8–27)
Bilirubin Total: 0.6 mg/dL (ref 0.0–1.2)
CO2: 21 mmol/L (ref 20–29)
Calcium: 9.4 mg/dL (ref 8.6–10.2)
Chloride: 105 mmol/L (ref 96–106)
Creatinine, Ser: 1.21 mg/dL (ref 0.76–1.27)
Globulin, Total: 2.5 g/dL (ref 1.5–4.5)
Glucose: 91 mg/dL (ref 70–99)
Potassium: 5.2 mmol/L (ref 3.5–5.2)
Sodium: 139 mmol/L (ref 134–144)
Total Protein: 6.9 g/dL (ref 6.0–8.5)
eGFR: 62 mL/min/1.73 (ref >59)

## 2024-02-11 LAB — CBC WITH DIFFERENTIAL/PLATELET
Basophils Absolute: 0.1 x10E3/uL (ref 0.0–0.2)
Basos: 1 %
EOS (ABSOLUTE): 0.3 x10E3/uL (ref 0.0–0.4)
Eos: 4 %
Hematocrit: 45.3 % (ref 37.5–51.0)
Hemoglobin: 14.2 g/dL (ref 13.0–17.7)
Immature Grans (Abs): 0 x10E3/uL (ref 0.0–0.1)
Immature Granulocytes: 0 %
Lymphocytes Absolute: 1.5 x10E3/uL (ref 0.7–3.1)
Lymphs: 22 %
MCH: 28 pg (ref 26.6–33.0)
MCHC: 31.3 g/dL — ABNORMAL LOW (ref 31.5–35.7)
MCV: 89 fL (ref 79–97)
Monocytes Absolute: 0.6 x10E3/uL (ref 0.1–0.9)
Monocytes: 8 %
Neutrophils Absolute: 4.6 x10E3/uL (ref 1.4–7.0)
Neutrophils: 65 %
Platelets: 131 x10E3/uL — ABNORMAL LOW (ref 150–450)
RBC: 5.08 x10E6/uL (ref 4.14–5.80)
RDW: 13.3 % (ref 11.6–15.4)
WBC: 7 x10E3/uL (ref 3.4–10.8)

## 2024-02-11 LAB — T4, FREE: Free T4: 1 ng/dL (ref 0.82–1.77)

## 2024-02-11 LAB — VITAMIN D 25 HYDROXY (VIT D DEFICIENCY, FRACTURES): Vit D, 25-Hydroxy: 39.6 ng/mL (ref 30.0–100.0)

## 2024-02-11 LAB — HEMOGLOBIN A1C
Est. average glucose Bld gHb Est-mCnc: 166 mg/dL
Hgb A1c MFr Bld: 7.4 % — ABNORMAL HIGH (ref 4.8–5.6)

## 2024-02-11 LAB — TSH: TSH: 2.18 u[IU]/mL (ref 0.450–4.500)

## 2024-02-11 LAB — TESTOSTERONE, TOTAL, LC/MS/MS: Testosterone, total: 307.2 ng/dL (ref 264.0–916.0)

## 2024-02-11 LAB — VITAMIN B12: Vitamin B-12: >2000 pg/mL — ABNORMAL HIGH (ref 232–1245)

## 2024-02-11 LAB — MICROALBUMIN / CREATININE URINE RATIO
Creatinine, Urine: 82.6 mg/dL
Microalb/Creat Ratio: 20 mg/g{creat} (ref 0–29)
Microalbumin, Urine: 16.8 ug/mL

## 2024-02-18 ENCOUNTER — Ambulatory Visit: Payer: Self-pay | Admitting: Nurse Practitioner

## 2024-02-18 DIAGNOSIS — E291 Testicular hypofunction: Secondary | ICD-10-CM

## 2024-02-18 MED ORDER — TESTOSTERONE 20.25 MG/ACT (1.62%) TD GEL
6.0000 | Freq: Every day | TRANSDERMAL | 1 refills | Status: DC
  Filled 2024-02-18 – 2024-02-19 (×2): qty 225, 30d supply, fill #0
  Filled 2024-02-25: qty 225, 45d supply, fill #0

## 2024-02-18 NOTE — Telephone Encounter (Signed)
 Copied from CRM #[id number]. Topic: Referral - Question >> Feb 15, 2024  3:36 PM Deaijah H wrote: Reason for CRM: [facility] called in asking if Camie Doing could look into patients chart at the referral to respond to question that was sent. Please call [phone].

## 2024-02-19 ENCOUNTER — Other Ambulatory Visit (HOSPITAL_COMMUNITY): Payer: Self-pay

## 2024-02-19 ENCOUNTER — Telehealth (HOSPITAL_COMMUNITY): Payer: Self-pay

## 2024-02-19 NOTE — Telephone Encounter (Signed)
 Insurance is saying that there is already an active PA on file for this medication. They will not accept a new prior auth at this time. Pharmacy may need to call insurance company and see if they can get an override.

## 2024-02-19 NOTE — Telephone Encounter (Signed)
 PA request has been Received. New Encounter has been or will be created for follow up. For additional info see Pharmacy Prior Auth telephone encounter from 02/19/24.

## 2024-02-20 ENCOUNTER — Other Ambulatory Visit (HOSPITAL_COMMUNITY): Payer: Self-pay

## 2024-02-25 ENCOUNTER — Other Ambulatory Visit: Payer: Self-pay

## 2024-02-25 ENCOUNTER — Other Ambulatory Visit (HOSPITAL_COMMUNITY): Payer: Self-pay

## 2024-02-28 ENCOUNTER — Other Ambulatory Visit: Payer: Self-pay

## 2024-02-28 DIAGNOSIS — N183 Chronic kidney disease, stage 3 unspecified: Secondary | ICD-10-CM

## 2024-02-28 NOTE — Progress Notes (Signed)
02/28/2024 Name: Paul [name] MRN: [id number] DOB: 1951-01-04  Chief Complaint  Patient presents with   Medication Management   Diabetes    Paul [name] is a 73 y.o. year old male who presented for a telephone visit.   They were referred to the pharmacist by their PCP for assistance in managing complex medication management.    Subjective:  Care Team: Primary Care Provider: Early, Camie BRAVO, NP   Medication Access/Adherence  Current Pharmacy:  [facility] [address], [address] [place] [address] Phone: [phone] Fax: [phone]  [facility] - [place], [place] - [address] [place] [address] Phone: [phone] Fax: [phone]  [facility] - [place], [place] - [address] [place] [address] [place] [address] Phone: [phone] Fax: [phone]   Patient reports affordability concerns with their medications: No  Patient reports access/transportation concerns to their pharmacy: No  Patient reports adherence concerns with their medications:  No     Diabetes:  Current medications: Novolog  ss, Toujeo  12 units, Metformin  XR 1000mg  BID, Jardiance  10mg  Medications tried in the past: Glipizide, Ozempic (N/V), Farxiga (frequent nighttime urination)  Current glucose readings:    Patient denies hypoglycemic s/sx including dizziness, shakiness, sweating. Patient denies hyperglycemic symptoms including polyuria, polydipsia, polyphagia, nocturia, neuropathy, blurred vision     Objective:  Lab Results  Component Value Date   HGBA1C 7.4 (H) 02/08/2024    Lab Results  Component Value Date   CREATININE 1.21 02/08/2024   BUN 23 02/08/2024   NA 139 02/08/2024   K 5.2 02/08/2024   CL 105 02/08/2024   CO2 21 02/08/2024    Lab Results  Component Value Date   CHOL 65 (L) 11/06/2023   HDL 23 (L) 11/06/2023   LDLCALC 17 11/06/2023   TRIG 143 11/06/2023   CHOLHDL 2.8  11/06/2023    Medications Reviewed Today     Reviewed by Lionell Jon [name], RPH (Pharmacist) on 02/28/24 at 1517  Med List Status: <None>   Medication Order Taking? Sig Documenting Provider Last Dose Status Informant  acetaminophen  (TYLENOL ) 500 MG tablet [id number]  Take 1,000 mg by mouth every 8 (eight) hours as needed for moderate pain (pain score 4-6). [provider]  Active Self, Pharmacy Records           Med Note Paul [name]   Dju Mar 17, 2023  2:52 PM)    albuterol  (VENTOLIN  HFA) 108 [phone] Base) MCG/ACT inhaler [id number]  Inhale 2 puffs into the lungs every 4 (four) hours as needed. For breathing. Early, Sara E, NP  Active   aspirin  EC 81 MG tablet [id number]  Take 1 tablet (81 mg total) by mouth daily. Oris Camie BRAVO, NP  Active   BD PEN NEEDLE NANO 2ND GEN 32G X 4 MM MISC [id number]  USE DAILY WITH INSULIN  AS DIRECTED Early, Sara E, NP  Active   budesonide -glycopyrrolate -formoterol  (BREZTRI  AEROSPHERE) 160-9-4.8 MCG/ACT AERO inhaler [id number]  Inhale 2 puffs into the lungs in the morning and at bedtime. Early, Sara E, NP  Active   Continuous Glucose Receiver (FREESTYLE LIBRE 3 READER) DEVI [id number]  Use with freestyle libre 3 sensor Tysinger, Alm RAMAN, PA-C  Active Self, Pharmacy Records  Continuous Glucose Sensor (FREESTYLE LIBRE 3 PLUS SENSOR) [place] [id number]  Change sensor every 15 days. Early, Sara E, NP  Active   dextromethorphan -guaiFENesin  (MUCINEX  DM) 30-600 MG 12hr tablet [id number]  Take 2 tablets in the morning as needed for cough. Early, Sara E, NP  Active   empagliflozin  (JARDIANCE ) 10 MG TABS tablet [id number] Yes Take 10 mg by mouth daily. [provider]  Active   gabapentin  (NEURONTIN ) 300 MG capsule [id number]  Take 2 capsules (600 mg total) by mouth 3 (three) times daily. For back and foot pain. Early, Sara E, NP  Active            Med Note Paul [name]   Thu Aug 23, 2023  1:59 PM) 2 times twice a day  glucose blood (ONETOUCH VERIO) test strip  [id number]  Use to check blood glucose 3 times daily  Patient not taking: Reported on 02/08/2024     Active Self, Pharmacy Records  HYDROcodone -acetaminophen  (NORCO/VICODIN) 5-325 MG tablet [id number]  Take 1-2 tablets by mouth every morning, THEN 1 tablet at noon, THEN 1 tablet at bedtime. (02/24/24)   Active   HYDROcodone -acetaminophen  (NORCO/VICODIN) 5-325 MG tablet [id number]  Take 1-2 tablets by mouth every morning, THEN 1 tablet at noon, THEN 1 tablet at bedtime. (01/25/24)   Active   insulin  aspart (NOVOLOG ) 100 UNIT/ML injection [id number]  Inject 0-9 Units into the skin 3 (three) times daily before meals. Sliding scale CBG 70-120 0 UNITS CBG 121-150 1 UNIT CBG 151-200 2 UNITS CGB 201-250 3 UNITS CGB 251-300 5 UNITS CGB 301-350 7 UNITS CGB 351-400 9 UNITS HIGHER TANH 400 CALL DOCTOR Early, Camie BRAVO, NP  Active   insulin  glargine, 1 Unit Dial , (TOUJEO  SOLOSTAR) 300 UNIT/ML Solostar Pen [id number]  Inject 12 Units into the skin daily at 6 (six) AM. Early, Camie BRAVO, NP  Active   metFORMIN  (GLUCOPHAGE -XR) 500 MG 24 hr tablet [id number]  Take 2 tablets (1,000 mg total) by mouth 2 (two) times daily. Early, Sara E, NP  Active   ondansetron  (ZOFRAN ) 4 MG tablet [id number]  Take 1 tablet (4 mg total) by mouth every 8 (eight) hours as needed for nausea or vomiting. Early, Sara E, NP  Active   pantoprazole  (PROTONIX ) 40 MG tablet [id number]  TAKE 1 TABLET DAILY (REPLACES OMEPRAZOLE ) Early, Sara E, NP  Active   rosuvastatin  (CRESTOR ) 20 MG tablet [id number]  TAKE 1 TABLET DAILY [name], [name], MD  Active   Testosterone  20.25 MG/ACT (1.62%) GEL [id number]  Apply 6 Pumps topically once daily. Early, Sara E, NP  Active   valsartan  (DIOVAN ) 80 MG tablet [id number]  Take 1 tablet (80 mg total) by mouth daily. [name], [name], MD  Active   zolpidem  (AMBIEN ) 10 MG tablet [id number]  Take 1 tablet (10 mg total) by mouth at bedtime as needed. Oris Camie BRAVO, NP  Active               Assessment/Plan:   Diabetes: -  Currently uncontrolled but improving - Reviewed long term cardiovascular and renal outcomes of uncontrolled blood sugar - Reviewed goal A1c, goal fasting, and goal 2 hour post prandial glucose - Reviewed dietary modifications including low carb diet: -Continue current medication regimen. Consider dose increase in Toujeo  to 14 units if A1C still >7 at next check -Time to renew enrollment for 2026 for Jardiance  (and Breztri ), patient elects to come by office this week to sign, paperwork prepared and waiting  Other: Mentions he still feels pain/weakness above knees/hips/shoulders. Wondering if more imaging or something can be ordered to evaluate. Will notify PCP      Follow Up Plan: 3 months  Jon [name] Lindau, PharmD Clinical Pharmacist  [phone]  

## 2024-03-06 ENCOUNTER — Telehealth: Payer: Self-pay

## 2024-03-06 NOTE — Progress Notes (Signed)
   03/06/2024  Patient ID: Paul [name], male   DOB: [birth year], 73 y.o.   MRN: [id number]  Faxed in completed application for Jardiance  2026 renewal to [facility] and Breztri  2026 renewal to [employer], pending decision.   Jon VEAR Lindau, PharmD Clinical Pharmacist [phone]

## 2024-03-10 NOTE — Telephone Encounter (Signed)
 Received approval letter from [employer] (Jardiance ) thru 04/16/2025, approval letter index, left a HIPAA VM.

## 2024-03-11 ENCOUNTER — Other Ambulatory Visit (HOSPITAL_COMMUNITY): Payer: Self-pay

## 2024-03-11 ENCOUNTER — Ambulatory Visit: Payer: Self-pay

## 2024-03-11 ENCOUNTER — Ambulatory Visit: Admitting: Family Medicine

## 2024-03-11 ENCOUNTER — Encounter: Payer: Self-pay | Admitting: Family Medicine

## 2024-03-11 VITALS — BP 122/74 | HR 68 | Temp 98.1°F | Ht 65.0 in | Wt 175.2 lb

## 2024-03-11 DIAGNOSIS — E1122 Type 2 diabetes mellitus with diabetic chronic kidney disease: Secondary | ICD-10-CM | POA: Diagnosis not present

## 2024-03-11 DIAGNOSIS — Z794 Long term (current) use of insulin: Secondary | ICD-10-CM

## 2024-03-11 DIAGNOSIS — T63461A Toxic effect of venom of wasps, accidental (unintentional), initial encounter: Secondary | ICD-10-CM

## 2024-03-11 DIAGNOSIS — N183 Chronic kidney disease, stage 3 unspecified: Secondary | ICD-10-CM

## 2024-03-11 MED ORDER — METHYLPREDNISOLONE 4 MG PO TBPK
ORAL_TABLET | ORAL | 0 refills | Status: AC
  Filled 2024-03-11: qty 21, 6d supply, fill #0

## 2024-03-11 NOTE — Telephone Encounter (Signed)
 FYI Only or Action Required?: FYI only for provider: appointment scheduled on 11/25.  Patient was last seen in primary care on 02/08/2024 by Early, Camie BRAVO, NP.  Called Nurse Triage reporting Insect Bite.  Symptoms began yesterday.  Interventions attempted: OTC medications: benadryl  and Ice/heat application.  Symptoms are: gradually worsening.  Triage Disposition: See HCP Within 4 Hours (Or PCP Triage)  Patient/caregiver understands and will follow disposition?: Yes  Copied from CRM #[id number]. Topic: Clinical - Red Word Triage >> Mar 11, 2024  2:07 PM Donna BRAVO wrote: Red Word that prompted transfer to Nurse Triage: stung by a bunch of bees Both arms Between elbow and hand Swelling Red Burning itching Reason for Disposition  More than 50 stings  Answer Assessment - Initial Assessment Questions Stung about 3pm yesterday helping his friend fix his deck and came upon a nest in the ground. Got up under his shirt. Believes there is likely about 50ish stings. Severe itching, burning pain. Denies SOB, CP or Dizziness. Patient breathing heavier on the call but adamant it is because he is in pain and breathes a little heavier at baseline.  Ice to the area helps for short time. Benadryl  last night with no benefit.  Appt with PCP office today 11/25 to assess   1. TYPE: What type of sting was it? (e.g., bee, yellow jacket, unknown)      Bee or yellow jacket 2. ONSET: When did it occur?      Stung about 3pm yesterday  3. LOCATION: Where is the sting located?  How many stings?     Both arms from hand to elbow. And under armpit  4. SWELLING SIZE: How big is the swelling? (e.g., inches or cm)     Right arm more swollen then left- swollen pretty fat 5. REDNESS: Is the area red or pink? If Yes, ask: What size is area of redness? (e.g., inches or cm). When did the redness start?     redness 6. PAIN: Is there any pain? If Yes, ask: How bad is it?  (Scale 0-10; or none, mild,  moderate, severe)     Burning pain 9-10/10 7. ITCHING: Is there any itching? If Yes, ask: How bad is it?      severe 8. RESPIRATORY DISTRESS: Describe your breathing.     Denies SOB- breathing heavier due to pain  9. PRIOR REACTIONS: Have you had any severe allergic reactions to stings in the past? If Yes, ask: What happened?     denies 10. OTHER SYMPTOMS: Do you have any other symptoms? (e.g., abdomen pain, face or tongue swelling, new rash elsewhere, vomiting)       denies  Protocols used: Bee or Yellow Jacket Sting-A-AH

## 2024-03-11 NOTE — Telephone Encounter (Signed)
 Received approval letter from [employer] (Breztri ) thru 04/16/2025, approval letter index.

## 2024-03-11 NOTE — Progress Notes (Signed)
   Name: Paul [name]   Date of Visit: 03/11/24   Date of last visit with me: Visit date not found   CHIEF COMPLAINT:  Chief Complaint  Patient presents with   Insect Bite    Yesterday around 3pm endured multiple yellow jacket stings on both arms. Took a benadryl  yesterday and alcohol  pads.        HPI:  Discussed the use of AI scribe software for clinical note transcription with the patient, who gave verbal consent to proceed.  History of Present Illness   Paul [name] is a 73 year old male who presents with multiple wasp stings causing significant discomfort.  He was stung by wasps yesterday around 3 PM while assisting a friend with porch work. The stings occurred on multiple sites, including his arm and back, as the wasps got under his long sleeve t-shirt. Since the incident, he has experienced pain, itching, and fatigue, describing his overall feeling as 'like crap' and 'pretty lousy'.  He manages his diabetes with 12 units of glargine in the morning and adjusts his quick-acting insulin  based on blood sugar levels, typically taking 6 or 7 units if his sugar rises. He is concerned about the potential impact of steroids on his blood sugar levels. He also uses testosterone  gel, applying three squirts on his shoulders, but reports discomfort, stating 'it hurt, makes me hurt'.  He recalls a family history of his father having an adverse reaction to cortisone due to diabetes, which has made him wary of similar medications.  He reports feeling tired and fatigued, and describes significant itching and pain at the sting sites.         OBJECTIVE:       02/08/2024    1:31 PM  Depression screen PHQ 2/9  Decreased Interest 0  Down, Depressed, Hopeless 0  PHQ - 2 Score 0     BP Readings from Last 3 Encounters:  03/11/24 122/74  02/08/24 124/78  01/14/24 112/74    BP 122/74   Pulse 68   Temp 98.1 F (36.7 C) (Tympanic)   Ht 5' 5 (1.651 m)   Wt 175 lb 3.2 oz (79.5 kg)   BMI  29.15 kg/m    Physical Exam          Physical Exam  Multiple areas of swelling consistent with histamine like reaction.  Noted along the extensor and flexor surfaces of the arms as well as the back.  Swelling erythema noted over these areas.  No signs of any infection. ASSESSMENT/PLAN:   Assessment & Plan Wasp sting, accidental or unintentional, initial encounter  Type 2 diabetes mellitus with stage 3 chronic kidney disease, with long-term current use of insulin , unspecified whether stage 3a or 3b CKD (HCC)    Assessment and Plan    Toxic effect of wasp venom Acute toxic reaction from multiple wasp stings causing pain, itching, and fatigue. - Prescribed Medrol  Dosepak to reduce inflammation. - Continue Benadryl  for symptom relief. - Advised immediate start of steroid pack.  Type 2 diabetes mellitus with diabetic chronic kidney disease Type 2 diabetes managed with insulin . Steroid treatment may elevate blood glucose. - Increase glargine to 15 units in the morning during steroid use. - Adjust sliding scale insulin  with meals as needed.         Emmanuelle Hibbitts A. Vita MD [facility]

## 2024-03-15 ENCOUNTER — Other Ambulatory Visit: Payer: Self-pay | Admitting: Nurse Practitioner

## 2024-03-15 DIAGNOSIS — E1169 Type 2 diabetes mellitus with other specified complication: Secondary | ICD-10-CM

## 2024-03-15 DIAGNOSIS — E1159 Type 2 diabetes mellitus with other circulatory complications: Secondary | ICD-10-CM

## 2024-03-15 DIAGNOSIS — I509 Heart failure, unspecified: Secondary | ICD-10-CM

## 2024-03-15 DIAGNOSIS — G4700 Insomnia, unspecified: Secondary | ICD-10-CM

## 2024-03-15 DIAGNOSIS — I25118 Atherosclerotic heart disease of native coronary artery with other forms of angina pectoris: Secondary | ICD-10-CM

## 2024-03-15 DIAGNOSIS — G4733 Obstructive sleep apnea (adult) (pediatric): Secondary | ICD-10-CM

## 2024-03-15 DIAGNOSIS — R531 Weakness: Secondary | ICD-10-CM

## 2024-03-15 DIAGNOSIS — N1831 Chronic kidney disease, stage 3a: Secondary | ICD-10-CM

## 2024-03-15 DIAGNOSIS — I739 Peripheral vascular disease, unspecified: Secondary | ICD-10-CM

## 2024-03-15 DIAGNOSIS — E1142 Type 2 diabetes mellitus with diabetic polyneuropathy: Secondary | ICD-10-CM

## 2024-03-15 DIAGNOSIS — E1122 Type 2 diabetes mellitus with diabetic chronic kidney disease: Secondary | ICD-10-CM

## 2024-03-24 ENCOUNTER — Other Ambulatory Visit (HOSPITAL_COMMUNITY): Payer: Self-pay

## 2024-03-24 MED ORDER — HYDROCODONE-ACETAMINOPHEN 7.5-325 MG PO TABS
1.0000 | ORAL_TABLET | Freq: Four times a day (QID) | ORAL | 0 refills | Status: AC | PRN
  Filled 2024-04-24: qty 120, 30d supply, fill #0

## 2024-03-24 MED ORDER — HYDROCODONE-ACETAMINOPHEN 7.5-325 MG PO TABS
1.0000 | ORAL_TABLET | Freq: Four times a day (QID) | ORAL | 0 refills | Status: AC | PRN
  Filled 2024-03-24: qty 120, 30d supply, fill #0

## 2024-03-24 MED ORDER — HYDROCODONE-ACETAMINOPHEN 7.5-325 MG PO TABS: 1.0000 | ORAL_TABLET | Freq: Four times a day (QID) | ORAL | 0 refills | Status: AC | PRN

## 2024-03-26 ENCOUNTER — Other Ambulatory Visit (HOSPITAL_COMMUNITY): Payer: Self-pay

## 2024-03-31 ENCOUNTER — Other Ambulatory Visit: Payer: Self-pay

## 2024-03-31 DIAGNOSIS — G4733 Obstructive sleep apnea (adult) (pediatric): Secondary | ICD-10-CM

## 2024-03-31 MED ORDER — ALBUTEROL SULFATE HFA 108 (90 BASE) MCG/ACT IN AERS: 2.0000 | INHALATION_SPRAY | RESPIRATORY_TRACT | 1 refills | Status: AC | PRN

## 2024-04-10 ENCOUNTER — Emergency Department (HOSPITAL_COMMUNITY)

## 2024-04-10 ENCOUNTER — Inpatient Hospital Stay (HOSPITAL_COMMUNITY)
Admission: EM | Admit: 2024-04-10 | Discharge: 2024-04-14 | DRG: 871 | Disposition: A | Discharge status: Home Health | Attending: Family Medicine | Admitting: Family Medicine

## 2024-04-10 ENCOUNTER — Encounter (HOSPITAL_COMMUNITY): Payer: Self-pay

## 2024-04-10 ENCOUNTER — Other Ambulatory Visit: Payer: Self-pay

## 2024-04-10 DIAGNOSIS — Z7982 Long term (current) use of aspirin: Secondary | ICD-10-CM

## 2024-04-10 DIAGNOSIS — E66811 Obesity, class 1: Secondary | ICD-10-CM | POA: Diagnosis present

## 2024-04-10 DIAGNOSIS — Z825 Family history of asthma and other chronic lower respiratory diseases: Secondary | ICD-10-CM

## 2024-04-10 DIAGNOSIS — E1165 Type 2 diabetes mellitus with hyperglycemia: Secondary | ICD-10-CM | POA: Diagnosis present

## 2024-04-10 DIAGNOSIS — Z87891 Personal history of nicotine dependence: Secondary | ICD-10-CM | POA: Diagnosis not present

## 2024-04-10 DIAGNOSIS — J441 Chronic obstructive pulmonary disease with (acute) exacerbation: Secondary | ICD-10-CM

## 2024-04-10 DIAGNOSIS — Z9981 Dependence on supplemental oxygen: Secondary | ICD-10-CM

## 2024-04-10 DIAGNOSIS — Z885 Allergy status to narcotic agent status: Secondary | ICD-10-CM

## 2024-04-10 DIAGNOSIS — R6520 Severe sepsis without septic shock: Secondary | ICD-10-CM | POA: Diagnosis not present

## 2024-04-10 DIAGNOSIS — I5032 Chronic diastolic (congestive) heart failure: Secondary | ICD-10-CM | POA: Diagnosis present

## 2024-04-10 DIAGNOSIS — F1010 Alcohol abuse, uncomplicated: Secondary | ICD-10-CM | POA: Diagnosis present

## 2024-04-10 DIAGNOSIS — J9601 Acute respiratory failure with hypoxia: Secondary | ICD-10-CM | POA: Diagnosis not present

## 2024-04-10 DIAGNOSIS — K766 Portal hypertension: Secondary | ICD-10-CM | POA: Diagnosis present

## 2024-04-10 DIAGNOSIS — A419 Sepsis, unspecified organism: Secondary | ICD-10-CM | POA: Diagnosis not present

## 2024-04-10 DIAGNOSIS — E785 Hyperlipidemia, unspecified: Secondary | ICD-10-CM | POA: Diagnosis present

## 2024-04-10 DIAGNOSIS — E1151 Type 2 diabetes mellitus with diabetic peripheral angiopathy without gangrene: Secondary | ICD-10-CM | POA: Diagnosis present

## 2024-04-10 DIAGNOSIS — H9190 Unspecified hearing loss, unspecified ear: Secondary | ICD-10-CM | POA: Diagnosis present

## 2024-04-10 DIAGNOSIS — J9621 Acute and chronic respiratory failure with hypoxia: Secondary | ICD-10-CM | POA: Diagnosis present

## 2024-04-10 DIAGNOSIS — R6521 Severe sepsis with septic shock: Secondary | ICD-10-CM | POA: Diagnosis present

## 2024-04-10 DIAGNOSIS — Z79899 Other long term (current) drug therapy: Secondary | ICD-10-CM

## 2024-04-10 DIAGNOSIS — J440 Chronic obstructive pulmonary disease with acute lower respiratory infection: Secondary | ICD-10-CM | POA: Diagnosis present

## 2024-04-10 DIAGNOSIS — E872 Acidosis, unspecified: Secondary | ICD-10-CM

## 2024-04-10 DIAGNOSIS — A403 Sepsis due to Streptococcus pneumoniae: Secondary | ICD-10-CM | POA: Diagnosis present

## 2024-04-10 DIAGNOSIS — I2510 Atherosclerotic heart disease of native coronary artery without angina pectoris: Secondary | ICD-10-CM | POA: Diagnosis not present

## 2024-04-10 DIAGNOSIS — Z7951 Long term (current) use of inhaled steroids: Secondary | ICD-10-CM

## 2024-04-10 DIAGNOSIS — G4733 Obstructive sleep apnea (adult) (pediatric): Secondary | ICD-10-CM | POA: Diagnosis present

## 2024-04-10 DIAGNOSIS — G9341 Metabolic encephalopathy: Secondary | ICD-10-CM | POA: Diagnosis present

## 2024-04-10 DIAGNOSIS — Z951 Presence of aortocoronary bypass graft: Secondary | ICD-10-CM

## 2024-04-10 DIAGNOSIS — Z1152 Encounter for screening for COVID-19: Secondary | ICD-10-CM

## 2024-04-10 DIAGNOSIS — Z7984 Long term (current) use of oral hypoglycemic drugs: Secondary | ICD-10-CM

## 2024-04-10 DIAGNOSIS — R0602 Shortness of breath: Secondary | ICD-10-CM | POA: Diagnosis present

## 2024-04-10 DIAGNOSIS — K7460 Unspecified cirrhosis of liver: Secondary | ICD-10-CM

## 2024-04-10 DIAGNOSIS — N179 Acute kidney failure, unspecified: Secondary | ICD-10-CM | POA: Diagnosis present

## 2024-04-10 DIAGNOSIS — E1122 Type 2 diabetes mellitus with diabetic chronic kidney disease: Secondary | ICD-10-CM | POA: Diagnosis present

## 2024-04-10 DIAGNOSIS — J13 Pneumonia due to Streptococcus pneumoniae: Secondary | ICD-10-CM | POA: Diagnosis present

## 2024-04-10 DIAGNOSIS — K219 Gastro-esophageal reflux disease without esophagitis: Secondary | ICD-10-CM | POA: Diagnosis not present

## 2024-04-10 DIAGNOSIS — E871 Hypo-osmolality and hyponatremia: Secondary | ICD-10-CM | POA: Diagnosis present

## 2024-04-10 DIAGNOSIS — Z794 Long term (current) use of insulin: Secondary | ICD-10-CM

## 2024-04-10 DIAGNOSIS — N183 Chronic kidney disease, stage 3 unspecified: Secondary | ICD-10-CM | POA: Diagnosis present

## 2024-04-10 DIAGNOSIS — J9691 Respiratory failure, unspecified with hypoxia: Secondary | ICD-10-CM | POA: Diagnosis present

## 2024-04-10 DIAGNOSIS — Z888 Allergy status to other drugs, medicaments and biological substances status: Secondary | ICD-10-CM

## 2024-04-10 DIAGNOSIS — I130 Hypertensive heart and chronic kidney disease with heart failure and stage 1 through stage 4 chronic kidney disease, or unspecified chronic kidney disease: Secondary | ICD-10-CM | POA: Diagnosis present

## 2024-04-10 DIAGNOSIS — N400 Enlarged prostate without lower urinary tract symptoms: Secondary | ICD-10-CM | POA: Diagnosis present

## 2024-04-10 DIAGNOSIS — T380X5A Adverse effect of glucocorticoids and synthetic analogues, initial encounter: Secondary | ICD-10-CM | POA: Diagnosis present

## 2024-04-10 DIAGNOSIS — D6959 Other secondary thrombocytopenia: Secondary | ICD-10-CM | POA: Diagnosis present

## 2024-04-10 DIAGNOSIS — Z955 Presence of coronary angioplasty implant and graft: Secondary | ICD-10-CM

## 2024-04-10 DIAGNOSIS — G894 Chronic pain syndrome: Secondary | ICD-10-CM | POA: Diagnosis present

## 2024-04-10 DIAGNOSIS — Z8249 Family history of ischemic heart disease and other diseases of the circulatory system: Secondary | ICD-10-CM

## 2024-04-10 LAB — COMPREHENSIVE METABOLIC PANEL WITH GFR
ALT: 21 U/L (ref 0–44)
AST: 28 U/L (ref 15–41)
Albumin: 4.1 g/dL (ref 3.5–5.0)
Alkaline Phosphatase: 82 U/L (ref 38–126)
Anion gap: 18 — ABNORMAL HIGH (ref 5–15)
BUN: 35 mg/dL — ABNORMAL HIGH (ref 8–23)
CO2: 17 mmol/L — ABNORMAL LOW (ref 22–32)
Calcium: 8.9 mg/dL (ref 8.9–10.3)
Chloride: 99 mmol/L (ref 98–111)
Creatinine, Ser: 2.34 mg/dL — ABNORMAL HIGH (ref 0.61–1.24)
GFR, Estimated: 28 mL/min — ABNORMAL LOW
Glucose, Bld: 196 mg/dL — ABNORMAL HIGH (ref 70–99)
Potassium: 4.8 mmol/L (ref 3.5–5.1)
Sodium: 134 mmol/L — ABNORMAL LOW (ref 135–145)
Total Bilirubin: 1 mg/dL (ref 0.0–1.2)
Total Protein: 7.1 g/dL (ref 6.5–8.1)

## 2024-04-10 LAB — CBC WITH DIFFERENTIAL/PLATELET
Basophils Absolute: 0 K/uL (ref 0.0–0.1)
Basophils Relative: 0 %
Eosinophils Absolute: 0 K/uL (ref 0.0–0.5)
Eosinophils Relative: 0 %
HCT: 44.7 % (ref 39.0–52.0)
Hemoglobin: 14.5 g/dL (ref 13.0–17.0)
Lymphocytes Relative: 11 %
Lymphs Abs: 2.3 K/uL (ref 0.7–4.0)
MCH: 28.2 pg (ref 26.0–34.0)
MCHC: 32.4 g/dL (ref 30.0–36.0)
MCV: 87 fL (ref 80.0–100.0)
Monocytes Absolute: 1.4 K/uL — ABNORMAL HIGH (ref 0.1–1.0)
Monocytes Relative: 7 %
Neutro Abs: 17 K/uL — ABNORMAL HIGH (ref 1.7–7.7)
Neutrophils Relative %: 82 %
Platelets: 153 K/uL (ref 150–400)
RBC: 5.14 MIL/uL (ref 4.22–5.81)
RDW: 15.2 % (ref 11.5–15.5)
WBC: 20.7 K/uL — ABNORMAL HIGH (ref 4.0–10.5)
nRBC: 0 % (ref 0.0–0.2)

## 2024-04-10 LAB — I-STAT VENOUS BLOOD GAS, ED
Acid-base deficit: 5 mmol/L — ABNORMAL HIGH (ref 0.0–2.0)
Bicarbonate: 18.1 mmol/L — ABNORMAL LOW (ref 20.0–28.0)
Calcium, Ion: 1.01 mmol/L — ABNORMAL LOW (ref 1.15–1.40)
HCT: 45 % (ref 39.0–52.0)
Hemoglobin: 15.3 g/dL (ref 13.0–17.0)
O2 Saturation: 85 %
Potassium: 4.8 mmol/L (ref 3.5–5.1)
Sodium: 136 mmol/L (ref 135–145)
TCO2: 19 mmol/L — ABNORMAL LOW (ref 22–32)
pCO2, Ven: 27.6 mmHg — ABNORMAL LOW (ref 44–60)
pH, Ven: 7.427 (ref 7.25–7.43)
pO2, Ven: 48 mmHg — ABNORMAL HIGH (ref 32–45)

## 2024-04-10 LAB — GLUCOSE, CAPILLARY
Glucose-Capillary: 307 mg/dL — ABNORMAL HIGH (ref 70–99)
Glucose-Capillary: 345 mg/dL — ABNORMAL HIGH (ref 70–99)

## 2024-04-10 LAB — TROPONIN T, HIGH SENSITIVITY
Troponin T High Sensitivity: 27 ng/L — ABNORMAL HIGH (ref 0–19)
Troponin T High Sensitivity: 23 ng/L — ABNORMAL HIGH (ref 0–19)

## 2024-04-10 LAB — LACTIC ACID, PLASMA: Lactic Acid, Venous: >9 mmol/L (ref 0.5–1.9)

## 2024-04-10 LAB — RESP PANEL BY RT-PCR (RSV, FLU A&B, COVID)  RVPGX2
Influenza A by PCR: NEGATIVE
Influenza B by PCR: NEGATIVE
Resp Syncytial Virus by PCR: NEGATIVE
SARS Coronavirus 2 by RT PCR: NEGATIVE

## 2024-04-10 LAB — MAGNESIUM: Magnesium: 1.4 mg/dL — ABNORMAL LOW (ref 1.7–2.4)

## 2024-04-10 LAB — I-STAT CG4 LACTIC ACID, ED
Lactic Acid, Venous: 6.9 mmol/L (ref 0.5–1.9)
Lactic Acid, Venous: 10.3 mmol/L (ref 0.5–1.9)

## 2024-04-10 LAB — PRO BRAIN NATRIURETIC PEPTIDE: Pro Brain Natriuretic Peptide: 1886 pg/mL — ABNORMAL HIGH

## 2024-04-10 LAB — MRSA NEXT GEN BY PCR, NASAL: MRSA by PCR Next Gen: NOT DETECTED

## 2024-04-10 MED ORDER — SODIUM CHLORIDE 0.9 % IV SOLN: 250.0000 mL | INTRAVENOUS | Status: DC

## 2024-04-10 MED ORDER — SODIUM CHLORIDE 0.9 % IV SOLN
2.0000 g | INTRAVENOUS | Status: AC
  Administered 2024-04-11 – 2024-04-14 (×4): 2 g via INTRAVENOUS
  Filled 2024-04-10 (×3): qty 20

## 2024-04-10 MED ORDER — IPRATROPIUM-ALBUTEROL 0.5-2.5 (3) MG/3ML IN SOLN
3.0000 mL | Freq: Once | RESPIRATORY_TRACT | Status: AC
  Administered 2024-04-10: 3 mL via RESPIRATORY_TRACT
  Filled 2024-04-10: qty 3

## 2024-04-10 MED ORDER — SODIUM CHLORIDE 0.9 % IV SOLN
2.0000 g | Freq: Once | INTRAVENOUS | Status: AC
  Administered 2024-04-10: 2 g via INTRAVENOUS
  Filled 2024-04-10: qty 20

## 2024-04-10 MED ORDER — SODIUM CHLORIDE 0.9 % IV SOLN
500.0000 mg | Freq: Once | INTRAVENOUS | Status: AC
  Administered 2024-04-10: 500 mg via INTRAVENOUS
  Filled 2024-04-10: qty 5

## 2024-04-10 MED ORDER — LACTATED RINGERS IV BOLUS
500.0000 mL | Freq: Once | INTRAVENOUS | Status: AC
  Administered 2024-04-10: 500 mL via INTRAVENOUS

## 2024-04-10 MED ORDER — ARFORMOTEROL TARTRATE 15 MCG/2ML IN NEBU
15.0000 ug | INHALATION_SOLUTION | Freq: Two times a day (BID) | RESPIRATORY_TRACT | Status: DC
  Administered 2024-04-11 – 2024-04-12 (×3): 15 ug via RESPIRATORY_TRACT
  Filled 2024-04-10 (×3): qty 2

## 2024-04-10 MED ORDER — NOREPINEPHRINE 4 MG/250ML-% IV SOLN
0.0000 ug/min | INTRAVENOUS | Status: DC
  Administered 2024-04-10: 3 ug/min via INTRAVENOUS
  Administered 2024-04-11: 6 ug/min via INTRAVENOUS
  Filled 2024-04-10 (×2): qty 250

## 2024-04-10 MED ORDER — METHYLPREDNISOLONE SODIUM SUCC 125 MG IJ SOLR
125.0000 mg | Freq: Once | INTRAMUSCULAR | Status: AC
  Administered 2024-04-10: 125 mg via INTRAVENOUS
  Filled 2024-04-10: qty 2

## 2024-04-10 MED ORDER — SODIUM CHLORIDE 0.9 % IV BOLUS (SEPSIS)
1000.0000 mL | Freq: Once | INTRAVENOUS | Status: AC
  Administered 2024-04-10: 1000 mL via INTRAVENOUS

## 2024-04-10 MED ORDER — METHYLPREDNISOLONE SODIUM SUCC 40 MG IJ SOLR
40.0000 mg | Freq: Every day | INTRAMUSCULAR | Status: DC
  Administered 2024-04-11: 40 mg via INTRAVENOUS
  Filled 2024-04-10: qty 1

## 2024-04-10 MED ORDER — DEXMEDETOMIDINE HCL IN NACL 400 MCG/100ML IV SOLN
0.0000 ug/kg/h | INTRAVENOUS | Status: DC
  Administered 2024-04-10: 0.4 ug/kg/h via INTRAVENOUS
  Filled 2024-04-10: qty 100

## 2024-04-10 MED ORDER — INSULIN ASPART 100 UNIT/ML IJ SOLN
0.0000 [IU] | INTRAMUSCULAR | Status: DC
  Administered 2024-04-10: 11 [IU] via SUBCUTANEOUS
  Administered 2024-04-10: 5 [IU] via SUBCUTANEOUS
  Administered 2024-04-11 (×2): 11 [IU] via SUBCUTANEOUS
  Administered 2024-04-11: 8 [IU] via SUBCUTANEOUS
  Filled 2024-04-10 (×3): qty 11
  Filled 2024-04-10: qty 8
  Filled 2024-04-10: qty 5

## 2024-04-10 MED ORDER — IPRATROPIUM-ALBUTEROL 0.5-2.5 (3) MG/3ML IN SOLN: 3.0000 mL | RESPIRATORY_TRACT | Status: DC | PRN

## 2024-04-10 MED ORDER — ACETAMINOPHEN 650 MG RE SUPP: 650.0000 mg | Freq: Four times a day (QID) | RECTAL | Status: DC | PRN

## 2024-04-10 MED ORDER — IPRATROPIUM-ALBUTEROL 0.5-2.5 (3) MG/3ML IN SOLN
3.0000 mL | Freq: Four times a day (QID) | RESPIRATORY_TRACT | Status: DC
  Administered 2024-04-10 – 2024-04-13 (×9): 3 mL via RESPIRATORY_TRACT
  Filled 2024-04-10 (×12): qty 3

## 2024-04-10 MED ORDER — SODIUM CHLORIDE 0.9 % IV SOLN
500.0000 mg | INTRAVENOUS | Status: DC
  Filled 2024-04-10 (×2): qty 5

## 2024-04-10 MED ORDER — ACETAMINOPHEN 500 MG PO TABS
1000.0000 mg | ORAL_TABLET | Freq: Once | ORAL | Status: AC
  Administered 2024-04-10: 1000 mg via ORAL
  Filled 2024-04-10: qty 2

## 2024-04-10 MED ORDER — PANTOPRAZOLE SODIUM 40 MG IV SOLR
40.0000 mg | INTRAVENOUS | Status: DC
  Administered 2024-04-10: 40 mg via INTRAVENOUS
  Filled 2024-04-10: qty 10

## 2024-04-10 MED ORDER — THIAMINE HCL 100 MG/ML IJ SOLN
100.0000 mg | Freq: Every day | INTRAMUSCULAR | Status: DC
  Administered 2024-04-10 – 2024-04-11 (×2): 100 mg via INTRAVENOUS
  Filled 2024-04-10 (×2): qty 2

## 2024-04-10 MED ORDER — CALCIUM GLUCONATE-NACL 1-0.675 GM/50ML-% IV SOLN
1.0000 g | Freq: Once | INTRAVENOUS | Status: AC
  Administered 2024-04-10: 1000 mg via INTRAVENOUS
  Filled 2024-04-10: qty 50

## 2024-04-10 MED ORDER — SODIUM CHLORIDE 0.9 % IV BOLUS
500.0000 mL | Freq: Once | INTRAVENOUS | Status: AC
  Administered 2024-04-10: 500 mL via INTRAVENOUS

## 2024-04-10 MED ORDER — HEPARIN SODIUM (PORCINE) 5000 UNIT/ML IJ SOLN
5000.0000 [IU] | Freq: Three times a day (TID) | INTRAMUSCULAR | Status: DC
  Administered 2024-04-10 – 2024-04-14 (×11): 5000 [IU] via SUBCUTANEOUS
  Filled 2024-04-10 (×11): qty 1

## 2024-04-10 MED ORDER — MAGNESIUM SULFATE 2 GM/50ML IV SOLN
2.0000 g | Freq: Once | INTRAVENOUS | Status: AC
  Administered 2024-04-10: 2 g via INTRAVENOUS
  Filled 2024-04-10: qty 50

## 2024-04-10 MED ORDER — FENTANYL CITRATE (PF) 50 MCG/ML IJ SOSY: 25.0000 ug | PREFILLED_SYRINGE | INTRAMUSCULAR | Status: DC | PRN

## 2024-04-10 MED ORDER — INSULIN ASPART 100 UNIT/ML IJ SOLN: 0.0000 [IU] | INTRAMUSCULAR | Status: DC

## 2024-04-10 MED ORDER — REVEFENACIN 175 MCG/3ML IN SOLN
175.0000 ug | Freq: Every day | RESPIRATORY_TRACT | Status: DC
  Administered 2024-04-11: 175 ug via RESPIRATORY_TRACT
  Filled 2024-04-10: qty 3

## 2024-04-10 MED ORDER — CHLORHEXIDINE GLUCONATE CLOTH 2 % EX PADS
6.0000 | MEDICATED_PAD | Freq: Every day | CUTANEOUS | Status: DC
  Administered 2024-04-10 – 2024-04-11 (×2): 6 via TOPICAL

## 2024-04-10 MED ORDER — ALBUTEROL SULFATE (2.5 MG/3ML) 0.083% IN NEBU
10.0000 mg/h | INHALATION_SOLUTION | Freq: Once | RESPIRATORY_TRACT | Status: AC
  Administered 2024-04-10: 10 mg/h via RESPIRATORY_TRACT
  Filled 2024-04-10: qty 12

## 2024-04-10 NOTE — ED Notes (Signed)
 Waiting on RT to assist with transporting patient to ICU. Report given to [place], 38M ICU.

## 2024-04-10 NOTE — ED Triage Notes (Signed)
 Pt bib [facility] coming from home with CC of shortness of breath, body aches, and weakness that began Tuesday. Pt on 90% RA on EMS arrival. Pt arrives to ED on 4L [place], with labored breathing. Pt does report chest pain. GCS 15.

## 2024-04-10 NOTE — Progress Notes (Signed)
 Patient transported from ED 18 to 2M04 with RT and RN, no complications noted.

## 2024-04-10 NOTE — ED Notes (Signed)
 Pt still showing labored breathing after being off bipap. MD notified of situation. RT paged and asked to see patient to put back on bipap. Pt educated on importance of bipap. Pt verbalized understanding.

## 2024-04-10 NOTE — ED Notes (Signed)
 Pt placed on NRB 15L. EDP notified of patient breathing/low SpO2 on 5L [place]. RT called for bipap placement.

## 2024-04-10 NOTE — ED Provider Notes (Signed)
 " [name] Provider Note   CSN: [id number] Arrival date & time: 04/10/24  1302     Patient presents with: Shortness of Breath and Fatigue   Paul [name] is a 73 y.o. male.   \Patient here shortness of breath body aches chills since yesterday.  History of COPD cirrhosis diabetes does not typically wear oxygen  at baseline.  He does not know if he had a fever but he has felt flulike.  Denies any chest pain he has been short of breath.  EMS gave him breathing treatment oxygen  saturation was 90% they put him on oxygen .  He denies any leg swelling.  Denies any nausea vomit diarrhea.  The history is provided by the patient.       Prior to Admission medications  Medication Sig Start Date End Date Taking? Authorizing Provider  acetaminophen  (TYLENOL ) 500 MG tablet Take 1,000 mg by mouth every 8 (eight) hours as needed for moderate pain (pain score 4-6).    [provider]  albuterol  (VENTOLIN  HFA) 108 (90 Base) MCG/ACT inhaler Inhale 2 puffs into the lungs every 4 (four) hours as needed. For breathing. 03/31/24   Oris Camie BRAVO, NP  ASPIRIN  LOW DOSE 81 MG tablet TAKE 1 TABLET DAILY 03/17/24   Early, Camie BRAVO, NP  BD PEN NEEDLE NANO 2ND GEN 32G X 4 MM MISC USE DAILY WITH INSULIN  AS DIRECTED 06/19/23   Early, Sara E, NP  budesonide -glycopyrrolate -formoterol  (BREZTRI  AEROSPHERE) 160-9-4.8 MCG/ACT AERO inhaler Inhale 2 puffs into the lungs in the morning and at bedtime. 12/07/23   Early, Sara E, NP  Continuous Glucose Receiver (FREESTYLE LIBRE 3 READER) DEVI Use with freestyle libre 3 sensor 12/08/22   Tysinger, Alm RAMAN, PA-C  Continuous Glucose Sensor (FREESTYLE LIBRE 3 PLUS SENSOR) MISC Change sensor every 15 days. 12/28/23   Early, Sara E, NP  dextromethorphan -guaiFENesin  (MUCINEX  DM) 30-600 MG 12hr tablet Take 2 tablets in the morning as needed for cough. 09/28/23   Early, Sara E, NP  empagliflozin  (JARDIANCE ) 10 MG TABS tablet Take 10 mg by mouth  daily.    [provider]  gabapentin  (NEURONTIN ) 300 MG capsule TAKE 2 CAPSULES THREE TIMES A DAY FOR BACK AND FOOT PAIN 03/17/24   Early, Sara E, NP  glucose blood (ONETOUCH VERIO) test strip Use to check blood glucose 3 times daily 12/27/20     HYDROcodone -acetaminophen  (NORCO) 7.5-325 MG tablet Take 1 tablet by mouth every 6 (six) hours as needed for pain. 03/24/24     HYDROcodone -acetaminophen  (NORCO) 7.5-325 MG tablet Take 1 tablet by mouth every 6 (six) hours as needed for pain. 04/23/24     HYDROcodone -acetaminophen  (NORCO) 7.5-325 MG tablet Take 1 tablet by mouth every 6 (six) hours as needed for pain. 05/23/24     Insulin  Aspart FlexPen (NOVOLOG ) 100 UNIT/ML USE AS INSTRUCTED BY YOUR PRESCRIBER 03/17/24   Oris Camie BRAVO, NP  insulin  glargine, 1 Unit Dial , (TOUJEO  SOLOSTAR) 300 UNIT/ML Solostar Pen Inject 12 Units into the skin daily at 6 (six) AM. 11/06/23   Early, Camie BRAVO, NP  metFORMIN  (GLUCOPHAGE -XR) 500 MG 24 hr tablet TAKE 2 TABLETS TWICE A DAY 03/17/24   Early, Sara E, NP  ondansetron  (ZOFRAN ) 4 MG tablet Take 1 tablet (4 mg total) by mouth every 8 (eight) hours as needed for nausea or vomiting. 05/17/23   Early, Camie BRAVO, NP  pantoprazole  (PROTONIX ) 40 MG tablet TAKE 1 TABLET DAILY (REPLACES OMEPRAZOLE ) 11/15/23   Early, Sara E,  NP  rosuvastatin  (CRESTOR ) 20 MG tablet TAKE 1 TABLET DAILY 11/15/23   [name], [name], MD  Testosterone  20.25 MG/ACT (1.62%) GEL Apply 6 Pumps topically once daily. 02/18/24   Early, Sara E, NP  valsartan  (DIOVAN ) 80 MG tablet Take 1 tablet (80 mg total) by mouth daily. 11/15/23   [name], [name], MD  zolpidem  (AMBIEN ) 10 MG tablet Take 1 tablet (10 mg total) by mouth at bedtime as needed. 02/25/24   Early, Sara E, NP    Allergies: Morphine  and codeine, Ozempic (0.25 or 0.5 mg-dose) [semaglutide(0.25 or 0.5mg -dos)], and Other    Review of Systems  Updated Vital Signs BP 116/68   Pulse (!) 133   Temp (!) 101.4 F (38.6 C) (Oral)   Resp (!) 28   Ht 5' 5  (1.651 m)   Wt 78.9 kg   SpO2 95%   BMI 28.96 kg/m   Physical Exam Vitals and nursing note reviewed.  Constitutional:      General: He is not in acute distress.    Appearance: He is well-developed. He is ill-appearing.  HENT:     Head: Normocephalic and atraumatic.  Eyes:     Conjunctiva/sclera: Conjunctivae normal.  Cardiovascular:     Rate and Rhythm: Normal rate and regular rhythm.     Heart sounds: No murmur heard. Pulmonary:     Effort: Pulmonary effort is normal. No respiratory distress.     Breath sounds: Decreased breath sounds and wheezing present.  Abdominal:     Palpations: Abdomen is soft.     Tenderness: There is no abdominal tenderness.  Musculoskeletal:        General: No swelling.     Cervical back: Neck supple.     Right lower leg: No edema.     Left lower leg: No edema.  Skin:    General: Skin is warm and dry.     Capillary Refill: Capillary refill takes less than 2 seconds.  Neurological:     Mental Status: He is alert.  Psychiatric:        Mood and Affect: Mood normal.     (all labs ordered are listed, but only abnormal results are displayed) Labs Reviewed  CBC WITH DIFFERENTIAL/PLATELET - Abnormal; Notable for the following components:      Result Value   WBC 20.7 (*)    Neutro Abs 17.0 (*)    Monocytes Absolute 1.4 (*)    All other components within normal limits  COMPREHENSIVE METABOLIC PANEL WITH GFR - Abnormal; Notable for the following components:   Sodium 134 (*)    CO2 17 (*)    Glucose, Bld 196 (*)    BUN 35 (*)    Creatinine, Ser 2.34 (*)    GFR, Estimated 28 (*)    Anion gap 18 (*)    All other components within normal limits  PRO BRAIN NATRIURETIC PEPTIDE - Abnormal; Notable for the following components:   Pro Brain Natriuretic Peptide 1,886.0 (*)    All other components within normal limits  I-STAT VENOUS BLOOD GAS, ED - Abnormal; Notable for the following components:   pCO2, Ven 27.6 (*)    pO2, Ven 48 (*)    Bicarbonate  18.1 (*)    TCO2 19 (*)    Acid-base deficit 5.0 (*)    Calcium , Ion 1.01 (*)    All other components within normal limits  TROPONIN T, HIGH SENSITIVITY - Abnormal; Notable for the following components:   Troponin T High Sensitivity  27 (*)    All other components within normal limits  RESP PANEL BY RT-PCR (RSV, FLU A&B, COVID)  [id number]  CULTURE, BLOOD (ROUTINE X 2)  CULTURE, BLOOD (ROUTINE X 2)  URINALYSIS, ROUTINE W REFLEX MICROSCOPIC    EKG: None  Radiology: DG Chest Portable 1 View Result Date: 04/10/2024 EXAM: 1 VIEW(S) XRAY OF THE CHEST 04/10/2024 01:43:00 PM COMPARISON: CXR 03/17/2023, CT Chest 03/17/2023. CLINICAL HISTORY: cough FINDINGS: LUNGS AND PLEURA: Low lung volumes. Interval development of the right lower lobe patchy airspace opacities with likely overlying trace pleural effusion. No left pleural effusion. No pneumothorax. HEART AND MEDIASTINUM: Surgical changes overlying mediastinum. Sternotomy wires noted. No acute abnormality of the cardiac and mediastinal silhouettes. BONES AND SOFT TISSUES: Partially seen cervical spinal fusion hardware. Sternotomy wires noted. No acute osseous abnormality. IMPRESSION: 1. Right lower lobe patchy airspace opacities with likely trace pleural effusion. Follow-up PA and lateral chest x-ray is recommended in 3-4 weeks following therapy to ensure resolution. Electronically signed by: Morgane Naveau MD 04/10/2024 01:57 PM EST RP Workstation: [id number]     .Critical Care  Performed by: Ruthe Cornet, DO Authorized by: Ruthe Cornet, DO   Critical care provider statement:    Critical care time (minutes):  35   Critical care was necessary to treat or prevent imminent or life-threatening deterioration of the following conditions:  Sepsis and respiratory failure   Critical care was time spent personally by me on the following activities:  Blood draw for specimens, development of treatment plan with patient or surrogate, discussions with  primary provider, evaluation of patient's response to treatment, examination of patient, obtaining history from patient or surrogate, ordering and performing treatments and interventions, ordering and review of laboratory studies, ordering and review of radiographic studies, pulse oximetry, re-evaluation of patient's condition and review of old charts   Care discussed with: admitting provider      Medications Ordered in the ED  magnesium  sulfate IVPB 2 g 50 mL (2 g Intravenous New Bag/Given 04/10/24 1400)  sodium chloride  0.9 % bolus 1,000 mL (has no administration in time range)  cefTRIAXone  (ROCEPHIN ) 2 g in sodium chloride  0.9 % 100 mL IVPB (has no administration in time range)  azithromycin  (ZITHROMAX ) 500 mg in sodium chloride  0.9 % 250 mL IVPB (has no administration in time range)  acetaminophen  (TYLENOL ) tablet 1,000 mg (has no administration in time range)  ipratropium-albuterol  (DUONEB) 0.5-2.5 (3) MG/3ML nebulizer solution 3 mL (3 mLs Nebulization Given 04/10/24 1344)  albuterol  (PROVENTIL ) (2.5 MG/3ML) 0.083% nebulizer solution (10 mg/hr Nebulization Given 04/10/24 1344)  methylPREDNISolone  sodium succinate (SOLU-MEDROL ) 125 mg/2 mL injection 125 mg (125 mg Intravenous Given 04/10/24 1357)  sodium chloride  0.9 % bolus 500 mL (500 mLs Intravenous New Bag/Given 04/10/24 1400)                                    Medical Decision Making Amount and/or Complexity of Data Reviewed Labs: ordered. Radiology: ordered.  Risk OTC drugs. Prescription drug management. Decision regarding hospitalization.   Yoltzin Barg is here with shortness of breath and cough and bodyaches for last 2 days.  90% on room air with EMS.  Does have history of COPD.  Put on 4 L of oxygen  and given breathing treatment with EMS.  Differential diagnosis likely COPD exacerbation in setting of viral process likely influenza.  But will check for ACS volume overload with labs including troponin BNP EKG chest  x-ray.   Will put him on a continuous albuterol  treatment given steroids and magnesium  and reevaluate.  Given his work of breathing and ongoing hypoxia we will put him on BiPAP and get a VBG until we can get further workup done.  White count is 20 and with fever and tachycardia we will pull sepsis workup and complete with IV antibiotics just in case this is not a viral process.  Blood gas is somewhat reassuring with a pH of 7.4.  pCO2 of 27.  Chest x-ray does show right lower lobe patchy airspace opacity concerning for pneumonia.  Ultimately patient is given IV antibiotics sepsis workup initiated.  Viral panel is in process.  Gas reassuring.  Will give Tylenol .  Will maintain BiPAP for now.  He is feeling somewhat uncomfortable with the BiPAP but his blood gas is reassuring we will transition him back to nasal cannula.  COVID flu RSV testing is negative.  Overall I do think patient septic from pneumonia.  Hemodynamically he is stable.  Will admit him to hospitalist for further care.  This chart was dictated using voice recognition software.  Despite best efforts to proofread,  errors can occur which can change the documentation meaning.      Final diagnoses:  Acute respiratory failure with hypoxia (HCC)  COPD exacerbation (HCC)  Sepsis, due to unspecified organism, unspecified whether acute organ dysfunction present [facility])    ED Discharge Orders     None          Ruthe Cornet, DO 04/10/24 1440  "

## 2024-04-10 NOTE — Hospital Course (Addendum)
 SABRA

## 2024-04-10 NOTE — ED Notes (Signed)
 RT at bedside reapplying bipap mask.

## 2024-04-10 NOTE — Sepsis Progress Note (Signed)
 Sepsis protocol monitored by eLink

## 2024-04-10 NOTE — H&P (Signed)
 "  NAME:  Paul [name], MRN:  [id number], DOB:  [birth year], LOS: 0 ADMISSION DATE:  04/10/2024, CONSULTATION DATE:  04/10/24 REFERRING MD:  Dr. Tobie, CHIEF COMPLAINT:  SOB   History of Present Illness:   26 yoM with PMH of COPD, former smoker, CAD, HFpEF, cirrhosis with some ongoing ETOH use, GERD, BPH, DMT2 presented with 2-day history of shortness of breath, cough, one episode of emesis, subjective fever and chills.  Some chronic cough unable to tell me a productive.  History limited as patient on BiPAP, denies any recent sick contacts, chest pain, abdominal pain, diarrhea, lower extremity pain or edema, urinary symptoms.  States he drinks 2 to days a week, former smoker quit 25 years ago, denies chronic pain or opiate use although on chart review he is on Oxy and seen at pain clinic.   In ER, febrile 101.4, tachycardic, initially normotensive, and initial sat 90% on RA.  Placed on BiPAP due to WOB.  Labs noted for WBC 20.7, sCr 2.34, bicarb 17, glucose 196, Mag 1.4, normal LFTs, pBNP 1886, trop 27, VBG 7.4/ 27/ 48/ 18 and iCa 1.01.  CXR showing RLL PNA.  SARS/ flu/ RSV neg.  EKG non acute, ST.  BC sent, started on ceftriaxone , azithro, treated with nebs, solumedrol, magnesium , and 1L LR.  Pt taken off BiPAP as he does not like mask and had improved, but soon developed recurrent WOB, placed back on, lactic 6.9.  PCCM called for admit.   Pertinent  Medical History   Past Medical History:  Diagnosis Date   Angina, class III    Anxiety    Arthritis    Bilateral carotid artery stenosis    BPH without obstruction/lower urinary tract symptoms    CAD 11/24/2011   a) Mild-to-moderate 30-40% lesions in the RCA, LAD and Circumflex. b) CULPRIT LESION: long tubular 70-80% lesion in D1 with FFR of 0.7 --> PCI w/ Xience Xpedition DES 2.5 mm x 30 mm (2.65 MM); c) Lexiscan  Myoview  11/2013: No Ischemia or Infarct (Inferior Gut Attenuation) EF 63%.   Chronic fatigue syndrome 06/13/2021   Chronic heart failure  (HCC)    Chronic heart failure with preserved ejection fraction (HFpEF) (HCC)    confirmed with echocardiogram   Chronic pain syndrome    Cirrhosis (HCC)    Constipation by delayed colonic transit 06/13/2021   Diverticulosis    Dysphagia    Elevated alkaline phosphatase level 08/17/2022   Elevated levels of transaminase & lactic acid dehydrogenase 06/09/2016   Exertional dyspnea, chronic    Fall 02/16/2023   Former smoker    GERD (gastroesophageal reflux disease)    Gout    H/O: GI bleed    Hearing loss    Hypo-osmolality and hyponatremia 03/11/2019   Hypoglycemia 03/18/2023   Hypotension due to drugs 10/09/2022   Iron  deficiency anemia    Lipoprotein deficiency disorder    Long term (current) use of insulin  (HCC) 07/15/2019   Major depressive disorder, recurrent    Malnutrition of moderate degree 03/19/2023   Obesity (BMI 30.0-34.9)    OSA (obstructive sleep apnea), uses oxygen  at home did not tolerate cpap 11/24/2011   Pain due to onychomycosis of toenails of both feet 08/23/2022   Pain in both lower extremities 02/14/2022   Paresthesia of skin 02/17/2015   Peripheral vascular disease    Portal hypertension (HCC)    Right hip pain 06/13/2021   S/P CABG (coronary artery bypass graft)    Shoulder joint pain 02/17/2015   Spinal  stenosis    Splenomegaly    Thrombocytopenia    Type 2 diabetes mellitus with stage 3 chronic kidney disease (HCC)    Type 2 diabetes mellitus with stage 3 chronic kidney disease, with long-term current use of insulin , unspecified whether stage 3a or 3b CKD (HCC)    Type II diabetes mellitus with peripheral circulatory disorder (HCC)    Vitamin D  deficiency    Significant Hospital Events: Including procedures, antibiotic start and stop dates in addition to other pertinent events   12/25 Admit hypoxic resp failure, BiPAP, RLL PNA, AKI, lactic acidosis   Interim History / Subjective:   Objective    Blood pressure (!) 87/61, pulse (!) 115,  temperature 98.7 F (37.1 C), temperature source Oral, resp. rate (!) 25, height 5' 5 (1.651 m), weight 78.9 kg, SpO2 92%.    FiO2 (%):  [60 %] 60 % PEEP:  [5 cmH20] 5 cmH20 Pressure Support:  [5 cmH20] 5 cmH20  No intake or output data in the 24 hours ending 04/10/24 1651 Filed Weights   04/10/24 1317  Weight: 78.9 kg   Examination: General:  ill appearing elderly male in moderate resp distress/ WOB HEENT: hard of hearing, no obvious JVP, full face mask Neuro: Alert/ oriented x3, MAE CV: rr, ST 110 PULM:  labored, clear, diminished in right base, no wheeze, on BiPAP 5/5, 60% with great TVs > 600, RR 30-40s GI: round, bs +, NT Extremities: warm/dry, no LE edema  Skin: no rashes  Resolved problem list   Assessment and Plan   Acute hypoxic respiratory failure 2/2 RLL PNA, concern for AECOPD - admit to ICU - cont BiPAP, no nausea at present.  Getting good volumes but WOB remains concern.  High risk for intubation.  Could consider HHFNC.   Initially pt stated no intubation/ CPR but then retracted, ok with short term intubation, no CPR - precedex  to tolerate BiPAP.  Possible some ETOH +/- opiate withdrawal could be contributing - goal sat > 90% - NPO - check MRSA PCR, full RVP, urine strep - cont azithro/ ceftriaxone  - duonebs, brovana , yupelri  - solumedrol 40mg  daily - given slightly elevated BNP and trop with tachycardia and hypoxia, PE remains in differential however with AKI, defer CTA for now as there are other explanations for his respiratory failure.   If trop increasing, consider empiric heparin  gtt.  Severe sepsis Lactic acidosis - BP borderline.  Getting additional LR bolus now.  Peripheral NE if needed for MAP > 65 - trend lactic.  Suspect 2/2 to significant WOB  - repeat troponin.  BNP elevated 1886 but no obvious signs of fluid overload, LFTs ok, EKG non acute, denies CP.  Check echo - follow cultures, trend WBC/ fever curve - CAP coverage  - no abd pain/  tenderness, doubt SBP with hx of cirrhosis - check UA - tylenol  prn fever   AKI Hypocalcemia Hypomagnesia - baseline sCr ~1.18, today 2.34 - additional LR x 1 - UA ordered - s/p mag 2 gm replete.  Calcium  gluconate 1 gm - consider renal US  if worsening  - trend renal indices  - strict I/Os, daily wts - avoid nephrotoxins, renal dose meds, hemodynamic support as above - Preferred narcotic agents for pain control are hydromorphone , fentanyl , and methadone. Morphine  should not be used.  Avoid Baclofen and avoid oral sodium phosphate and magnesium  citrate based laxatives / bowel preps.   CAD HLD HTN - npo- hold ASA, crestor , valsartan , jardiance   DMT2 with hyperglycemia  -  A1c 01/2024 7.4 - CBG q 4 with prn mSSI, goal 140-180.  If still elevated, consider adding lantus .   GERD - PPI  Cirrhosis ETOH use- admits to ETOH ~2 days/ week - CIWA with precedex  - empiric thiamine   - LFTs ok, no abd pain/ tenderness, doubt any concern for SBP  Chronic pain - NPO, prn fentanyl  q 2hrs prn  - hold pta Norco, gabapentin    GOC - pt does not want CPR.  Ok with short term intubation. - lives with a friend, said his brother is NOK  Labs   CBC: Recent Labs  Lab 04/10/24 1335 04/10/24 1345  WBC 20.7*  --   NEUTROABS 17.0*  --   HGB 14.5 15.3  HCT 44.7 45.0  MCV 87.0  --   PLT 153  --     Basic Metabolic Panel: Recent Labs  Lab 04/10/24 1335 04/10/24 1345  NA 134* 136  K 4.8 4.8  CL 99  --   CO2 17*  --   GLUCOSE 196*  --   BUN 35*  --   CREATININE 2.34*  --   CALCIUM  8.9  --   MG 1.4*  --    GFR: Estimated Creatinine Clearance: 26.4 mL/min (A) (by C-G formula based on SCr of 2.34 mg/dL (H)). Recent Labs  Lab 04/10/24 1335 04/10/24 1504  WBC 20.7*  --   LATICACIDVEN  --  6.9*    Liver Function Tests: Recent Labs  Lab 04/10/24 1335  AST 28  ALT 21  ALKPHOS 82  BILITOT 1.0  PROT 7.1  ALBUMIN  4.1   No results for input(s): LIPASE, AMYLASE in  the last 168 hours. No results for input(s): AMMONIA in the last 168 hours.  ABG    Component Value Date/Time   PHART 7.395 01/25/2021 1605   PCO2ART 34.1 01/25/2021 1605   PO2ART 71 (L) 01/25/2021 1605   HCO3 18.1 (L) 04/10/2024 1345   TCO2 19 (L) 04/10/2024 1345   ACIDBASEDEF 5.0 (H) 04/10/2024 1345   O2SAT 85 04/10/2024 1345     Coagulation Profile: No results for input(s): INR, PROTIME in the last 168 hours.  Cardiac Enzymes: No results for input(s): CKTOTAL, CKMB, CKMBINDEX, TROPONINI in the last 168 hours.  HbA1C: Hgb A1c MFr Bld  Date/Time Value Ref Range Status  02/08/2024 02:36 PM 7.4 (H) 4.8 - 5.6 % Final    Comment:             Prediabetes: 5.7 - 6.4          Diabetes: >6.4          Glycemic control for adults with diabetes: <7.0   11/06/2023 02:13 PM 7.8 (H) 4.8 - 5.6 % Final    Comment:             Prediabetes: 5.7 - 6.4          Diabetes: >6.4          Glycemic control for adults with diabetes: <7.0     CBG: No results for input(s): GLUCAP in the last 168 hours.  Review of Systems:   Review of Systems  Constitutional:  Positive for chills, fever and malaise/fatigue.  HENT:  Positive for hearing loss.   Respiratory:  Positive for cough and shortness of breath. Negative for hemoptysis.   Cardiovascular:  Negative for chest pain and leg swelling.  Gastrointestinal:  Positive for vomiting. Negative for abdominal pain, diarrhea and nausea.  Genitourinary:  Negative for dysuria.  Neurological:  Negative for focal weakness and loss of consciousness.   Past Medical History:  He,  has a past medical history of Angina, class III, Anxiety, Arthritis, Bilateral carotid artery stenosis, BPH without obstruction/lower urinary tract symptoms, CAD (11/24/2011), Chronic fatigue syndrome (06/13/2021), Chronic heart failure (HCC), Chronic heart failure with preserved ejection fraction (HFpEF) (HCC), Chronic pain syndrome, Cirrhosis (HCC), Constipation by  delayed colonic transit (06/13/2021), Diverticulosis, Dysphagia, Elevated alkaline phosphatase level (08/17/2022), Elevated levels of transaminase & lactic acid dehydrogenase (06/09/2016), Exertional dyspnea, chronic, Fall (02/16/2023), Former smoker, GERD (gastroesophageal reflux disease), Gout, H/O: GI bleed, Hearing loss, Hypo-osmolality and hyponatremia (03/11/2019), Hypoglycemia (03/18/2023), Hypotension due to drugs (10/09/2022), Iron  deficiency anemia, Lipoprotein deficiency disorder, Long term (current) use of insulin  (HCC) (07/15/2019), Major depressive disorder, recurrent, Malnutrition of moderate degree (03/19/2023), Obesity (BMI 30.0-34.9), OSA (obstructive sleep apnea), uses oxygen  at home did not tolerate cpap (11/24/2011), Pain due to onychomycosis of toenails of both feet (08/23/2022), Pain in both lower extremities (02/14/2022), Paresthesia of skin (02/17/2015), Peripheral vascular disease, Portal hypertension (HCC), Right hip pain (06/13/2021), S/P CABG (coronary artery bypass graft), Shoulder joint pain (02/17/2015), Spinal stenosis, Splenomegaly, Thrombocytopenia, Type 2 diabetes mellitus with stage 3 chronic kidney disease (HCC), Type 2 diabetes mellitus with stage 3 chronic kidney disease, with long-term current use of insulin , unspecified whether stage 3a or 3b CKD (HCC), Type II diabetes mellitus with peripheral circulatory disorder (HCC), and Vitamin D  deficiency.   Surgical History:   Past Surgical History:  Procedure Laterality Date   CERVICAL SPINE SURGERY  2012   CORONARY ANGIOPLASTY WITH STENT PLACEMENT  11/23/2011   1; first one   CORONARY ARTERY BYPASS GRAFT N/A 01/25/2021   Procedure: CORONARY ARTERY BYPASS GRAFTING (CABG) X2, USING LEFT INTERNAL MAMMARY ARTERY AND LEFT LEG GREATER SAPHENOUS VEIN HARVESTED ENDOSCOPICALLY;  Surgeon: Shyrl Linnie KIDD, MD;  Location: [facility] OR;  Service: Open Heart Surgery;  Laterality: N/A;   ENDOVEIN HARVEST OF GREATER SAPHENOUS VEIN  Bilateral 01/25/2021   Procedure: ENDOVEIN HARVEST OF GREATER SAPHENOUS VEIN;  Surgeon: Shyrl Linnie KIDD, MD;  Location: [facility] OR;  Service: Open Heart Surgery;  Laterality: Bilateral;   LEFT HEART CATH AND CORONARY ANGIOGRAPHY N/A 12/28/2020   Procedure: LEFT HEART CATH AND CORONARY ANGIOGRAPHY;  Surgeon: Anner Alm ORN, MD;  Location: [facility] INVASIVE CV LAB;  Service: Cardiovascular;  Laterality: N/A;   LEFT HEART CATHETERIZATION WITH CORONARY ANGIOGRAM N/A 11/23/2011   Procedure: LEFT HEART CATHETERIZATION WITH CORONARY ANGIOGRAM;  Surgeon: Alm ORN Anner, MD;  Location: [facility] CATH LAB;  Service: Cardiovascular;  Laterality: N/A;   LESION REMOVAL Left 03/06/2023   Procedure: Excision of skin lesion, left hand;  Surgeon: Waddell Leonce NOVAK, MD;  Location: [facility];  Service: Plastics;  Laterality: Left;   TEE WITHOUT CARDIOVERSION N/A 01/25/2021   Procedure: TRANSESOPHAGEAL ECHOCARDIOGRAM (TEE);  Surgeon: Shyrl Linnie KIDD, MD;  Location: [facility] OR;  Service: Open Heart Surgery;  Laterality: N/A;     Social History:   reports that he quit smoking about 7 years ago. His smoking use included cigarettes. He started smoking about 47 years ago. He has a 40 pack-year smoking history. He has been exposed to tobacco smoke. He has never used smokeless tobacco. He reports current alcohol  use. He reports that he does not use drugs.   Family History:  His family history includes Aneurysm in his father; Emphysema in his father; Heart attack in his brother; Heart disease in his sister.   Allergies Allergies[1]   Home Medications  Prior to  Admission medications  Medication Sig Start Date End Date Taking? Authorizing Provider  acetaminophen  (TYLENOL ) 500 MG tablet Take 1,000 mg by mouth every 8 (eight) hours as needed for moderate pain (pain score 4-6).    [provider]  albuterol  (VENTOLIN  HFA) 108 (90 Base) MCG/ACT inhaler Inhale 2 puffs into the lungs every 4 (four) hours as needed.  For breathing. 03/31/24   Early, Sara E, NP  ASPIRIN  LOW DOSE 81 MG tablet TAKE 1 TABLET DAILY 03/17/24   Early, Camie BRAVO, NP  BD PEN NEEDLE NANO 2ND GEN 32G X 4 MM MISC USE DAILY WITH INSULIN  AS DIRECTED 06/19/23   Early, Sara E, NP  budesonide -glycopyrrolate -formoterol  (BREZTRI  AEROSPHERE) 160-9-4.8 MCG/ACT AERO inhaler Inhale 2 puffs into the lungs in the morning and at bedtime. 12/07/23   Early, Sara E, NP  Continuous Glucose Receiver (FREESTYLE LIBRE 3 READER) DEVI Use with freestyle libre 3 sensor 12/08/22   Tysinger, Alm RAMAN, PA-C  Continuous Glucose Sensor (FREESTYLE LIBRE 3 PLUS SENSOR) MISC Change sensor every 15 days. 12/28/23   Early, Sara E, NP  dextromethorphan -guaiFENesin  (MUCINEX  DM) 30-600 MG 12hr tablet Take 2 tablets in the morning as needed for cough. 09/28/23   Early, Sara E, NP  empagliflozin  (JARDIANCE ) 10 MG TABS tablet Take 10 mg by mouth daily.    [provider]  gabapentin  (NEURONTIN ) 300 MG capsule TAKE 2 CAPSULES THREE TIMES A DAY FOR BACK AND FOOT PAIN 03/17/24   Early, Sara E, NP  glucose blood (ONETOUCH VERIO) test strip Use to check blood glucose 3 times daily 12/27/20     HYDROcodone -acetaminophen  (NORCO) 7.5-325 MG tablet Take 1 tablet by mouth every 6 (six) hours as needed for pain. 03/24/24     HYDROcodone -acetaminophen  (NORCO) 7.5-325 MG tablet Take 1 tablet by mouth every 6 (six) hours as needed for pain. 04/23/24     HYDROcodone -acetaminophen  (NORCO) 7.5-325 MG tablet Take 1 tablet by mouth every 6 (six) hours as needed for pain. 05/23/24     Insulin  Aspart FlexPen (NOVOLOG ) 100 UNIT/ML USE AS INSTRUCTED BY YOUR PRESCRIBER 03/17/24   Early, Sara E, NP  insulin  glargine, 1 Unit Dial , (TOUJEO  SOLOSTAR) 300 UNIT/ML Solostar Pen Inject 12 Units into the skin daily at 6 (six) AM. 11/06/23   Early, Camie BRAVO, NP  metFORMIN  (GLUCOPHAGE -XR) 500 MG 24 hr tablet TAKE 2 TABLETS TWICE A DAY 03/17/24   Early, Sara E, NP  ondansetron  (ZOFRAN ) 4 MG tablet Take 1 tablet (4 mg total) by  mouth every 8 (eight) hours as needed for nausea or vomiting. 05/17/23   Early, Sara E, NP  pantoprazole  (PROTONIX ) 40 MG tablet TAKE 1 TABLET DAILY (REPLACES OMEPRAZOLE ) 11/15/23   Early, Sara E, NP  rosuvastatin  (CRESTOR ) 20 MG tablet TAKE 1 TABLET DAILY 11/15/23   [name], [name], MD  Testosterone  20.25 MG/ACT (1.62%) GEL Apply 6 Pumps topically once daily. 02/18/24   Early, Sara E, NP  valsartan  (DIOVAN ) 80 MG tablet Take 1 tablet (80 mg total) by mouth daily. 11/15/23   [name], [name], MD  zolpidem  (AMBIEN ) 10 MG tablet Take 1 tablet (10 mg total) by mouth at bedtime as needed. 02/25/24   Oris Camie BRAVO, NP         CRITICAL CARE Performed by: Lyle Pesa   Total critical care time: 55 minutes  Critical care time was exclusive of separately billable procedures and treating other patients.  Critical care was necessary to treat or prevent imminent or life-threatening deterioration.  Critical care  was time spent personally by me on the following activities: development of treatment plan with patient and/or surrogate as well as nursing, discussions with consultants, evaluation of patient's response to treatment, examination of patient, obtaining history from patient or surrogate, ordering and performing treatments and interventions, ordering and review of laboratory studies, ordering and review of radiographic studies, pulse oximetry and re-evaluation of patient's condition.    Lyle Pesa, NP [facility] 04/10/2024, 4:52 PM  See Amion for pager If no response to pager , please call [phone] until 7pm After 7:00 pm call Elink  [phone]           [1]  Allergies Allergen Reactions   Morphine  And Codeine Itching    Possible itching due to morphine  01/26/21   Ozempic (0.25 Or 0.5 Mg-Dose) [Semaglutide(0.25 Or 0.5mg -Dos)] Nausea And Vomiting   Other Other (See Comments)    Cats- eyes burn   "

## 2024-04-10 NOTE — Progress Notes (Signed)
 eLink Physician-Brief Progress Note Patient Name: Paul [name] DOB: March 23, 1951 MRN: [id number]   Date of Service  04/10/2024  HPI/Events of Note  LA 9, started to improve, from earlier > 10.  Camera: On nasal o2 sast 93%, HR 80, sinus MAP > 65 On precedex  and levo at 7 mcg/min  Data: LFT ok Elevated BNP.   Acute hypoxic respiratory failure in setting of right lower lobe pneumonia, COPD exacerbation Sepsis due to pneumonia, lactic acidosis Hx of cirrhosis.  Total 2 lit fluids. UOP 250  in this shift. On thiamine .  No belly pain, abdomen is soft.   eICU Interventions  LR 500 ml bolus, CxR image: no obvious CHF or effusion. Right lower air space density. Follow LA.      Intervention Category Intermediate Interventions: Other:  Jodelle ONEIDA Hutching 04/10/2024, 11:02 PM

## 2024-04-10 NOTE — ED Notes (Signed)
 Brooke NP at bedside speaking with patient. Pt on bipap but repeatedly asking for bipap mask to come off and stating it's hard to breath and he's aggravated.

## 2024-04-11 ENCOUNTER — Other Ambulatory Visit: Payer: Self-pay

## 2024-04-11 DIAGNOSIS — Z794 Long term (current) use of insulin: Secondary | ICD-10-CM

## 2024-04-11 DIAGNOSIS — Z87891 Personal history of nicotine dependence: Secondary | ICD-10-CM

## 2024-04-11 DIAGNOSIS — J13 Pneumonia due to Streptococcus pneumoniae: Secondary | ICD-10-CM

## 2024-04-11 DIAGNOSIS — E785 Hyperlipidemia, unspecified: Secondary | ICD-10-CM

## 2024-04-11 DIAGNOSIS — E1165 Type 2 diabetes mellitus with hyperglycemia: Secondary | ICD-10-CM

## 2024-04-11 DIAGNOSIS — I2510 Atherosclerotic heart disease of native coronary artery without angina pectoris: Secondary | ICD-10-CM

## 2024-04-11 LAB — RESPIRATORY PANEL BY PCR

## 2024-04-11 LAB — COMPREHENSIVE METABOLIC PANEL WITH GFR
ALT: 19 U/L (ref 0–44)
AST: 26 U/L (ref 15–41)
Albumin: 3.6 g/dL (ref 3.5–5.0)
Alkaline Phosphatase: 65 U/L (ref 38–126)
Anion gap: 18 — ABNORMAL HIGH (ref 5–15)
BUN: 40 mg/dL — ABNORMAL HIGH (ref 8–23)
CO2: 16 mmol/L — ABNORMAL LOW (ref 22–32)
Calcium: 8.6 mg/dL — ABNORMAL LOW (ref 8.9–10.3)
Chloride: 100 mmol/L (ref 98–111)
Creatinine, Ser: 2.17 mg/dL — ABNORMAL HIGH (ref 0.61–1.24)
GFR, Estimated: 31 mL/min — ABNORMAL LOW
Glucose, Bld: 397 mg/dL — ABNORMAL HIGH (ref 70–99)
Potassium: 4.2 mmol/L (ref 3.5–5.1)
Sodium: 134 mmol/L — ABNORMAL LOW (ref 135–145)
Total Bilirubin: 0.5 mg/dL (ref 0.0–1.2)
Total Protein: 6.5 g/dL (ref 6.5–8.1)

## 2024-04-11 LAB — URINALYSIS, ROUTINE W REFLEX MICROSCOPIC
Bilirubin Urine: NEGATIVE
Glucose, UA: >=500 mg/dL — AB
Hgb urine dipstick: NEGATIVE
Ketones, ur: NEGATIVE mg/dL
Leukocytes,Ua: NEGATIVE
Nitrite: NEGATIVE
Protein, ur: NEGATIVE mg/dL
Specific Gravity, Urine: 1.018 (ref 1.005–1.030)
pH: 5 (ref 5.0–8.0)

## 2024-04-11 LAB — GLUCOSE, CAPILLARY
Glucose-Capillary: 339 mg/dL — ABNORMAL HIGH (ref 70–99)
Glucose-Capillary: 290 mg/dL — ABNORMAL HIGH (ref 70–99)
Glucose-Capillary: 282 mg/dL — ABNORMAL HIGH (ref 70–99)
Glucose-Capillary: 274 mg/dL — ABNORMAL HIGH (ref 70–99)
Glucose-Capillary: 261 mg/dL — ABNORMAL HIGH (ref 70–99)

## 2024-04-11 LAB — CBC
HCT: 41.1 % (ref 39.0–52.0)
Hemoglobin: 13 g/dL (ref 13.0–17.0)
MCH: 28.1 pg (ref 26.0–34.0)
MCHC: 31.6 g/dL (ref 30.0–36.0)
MCV: 88.8 fL (ref 80.0–100.0)
Platelets: 125 K/uL — ABNORMAL LOW (ref 150–400)
RBC: 4.63 MIL/uL (ref 4.22–5.81)
RDW: 15.5 % (ref 11.5–15.5)
WBC: 24.1 K/uL — ABNORMAL HIGH (ref 4.0–10.5)
nRBC: 0 % (ref 0.0–0.2)

## 2024-04-11 LAB — LACTIC ACID, PLASMA: Lactic Acid, Venous: 2.9 mmol/L (ref 0.5–1.9)

## 2024-04-11 LAB — PHOSPHORUS: Phosphorus: 4.1 mg/dL (ref 2.5–4.6)

## 2024-04-11 LAB — MAGNESIUM: Magnesium: 2.3 mg/dL (ref 1.7–2.4)

## 2024-04-11 LAB — STREP PNEUMONIAE URINARY ANTIGEN: Strep Pneumo Urinary Antigen: POSITIVE — AB

## 2024-04-11 MED ORDER — BUDESONIDE 0.25 MG/2ML IN SUSP
0.2500 mg | Freq: Two times a day (BID) | RESPIRATORY_TRACT | Status: DC
  Administered 2024-04-11 – 2024-04-12 (×3): 0.25 mg via RESPIRATORY_TRACT
  Filled 2024-04-11 (×3): qty 2

## 2024-04-11 MED ORDER — ORAL CARE MOUTH RINSE: 15.0000 mL | OROMUCOSAL | Status: DC | PRN

## 2024-04-11 MED ORDER — INSULIN ASPART 100 UNIT/ML IJ SOLN
0.0000 [IU] | Freq: Three times a day (TID) | INTRAMUSCULAR | Status: DC
  Administered 2024-04-11 – 2024-04-12 (×5): 11 [IU] via SUBCUTANEOUS
  Administered 2024-04-13 (×2): 7 [IU] via SUBCUTANEOUS
  Administered 2024-04-13: 4 [IU] via SUBCUTANEOUS
  Filled 2024-04-11 (×2): qty 11
  Filled 2024-04-11: qty 7
  Filled 2024-04-11: qty 11
  Filled 2024-04-11: qty 4
  Filled 2024-04-11 (×2): qty 11
  Filled 2024-04-11: qty 7

## 2024-04-11 MED ORDER — HYDROCODONE-ACETAMINOPHEN 7.5-325 MG PO TABS
1.0000 | ORAL_TABLET | Freq: Four times a day (QID) | ORAL | Status: DC | PRN
  Administered 2024-04-11 – 2024-04-14 (×7): 1 via ORAL
  Filled 2024-04-11 (×6): qty 1

## 2024-04-11 MED ORDER — LORAZEPAM 2 MG/ML IJ SOLN: 1.0000 mg | INTRAMUSCULAR | Status: DC | PRN

## 2024-04-11 MED ORDER — INSULIN GLARGINE 100 UNIT/ML ~~LOC~~ SOLN
12.0000 [IU] | Freq: Every day | SUBCUTANEOUS | Status: DC
  Administered 2024-04-11 – 2024-04-12 (×2): 12 [IU] via SUBCUTANEOUS
  Filled 2024-04-11 (×2): qty 0.12

## 2024-04-11 MED ORDER — THIAMINE MONONITRATE 100 MG PO TABS
100.0000 mg | ORAL_TABLET | Freq: Every day | ORAL | Status: DC
  Administered 2024-04-12 – 2024-04-14 (×3): 100 mg via ORAL
  Filled 2024-04-11 (×2): qty 1

## 2024-04-11 MED ORDER — AZITHROMYCIN 500 MG PO TABS
500.0000 mg | ORAL_TABLET | Freq: Every day | ORAL | Status: AC
  Administered 2024-04-11 – 2024-04-14 (×4): 500 mg via ORAL
  Filled 2024-04-11 (×4): qty 1

## 2024-04-11 MED ORDER — GABAPENTIN 300 MG PO CAPS
300.0000 mg | ORAL_CAPSULE | Freq: Three times a day (TID) | ORAL | Status: DC
  Administered 2024-04-11 – 2024-04-14 (×10): 300 mg via ORAL
  Filled 2024-04-11 (×9): qty 1

## 2024-04-11 MED ORDER — PANTOPRAZOLE SODIUM 40 MG PO TBEC
40.0000 mg | DELAYED_RELEASE_TABLET | Freq: Every day | ORAL | Status: DC
  Administered 2024-04-11 – 2024-04-13 (×3): 40 mg via ORAL
  Filled 2024-04-11 (×3): qty 1

## 2024-04-11 MED ORDER — ROSUVASTATIN CALCIUM 5 MG PO TABS
10.0000 mg | ORAL_TABLET | Freq: Every day | ORAL | Status: DC
  Administered 2024-04-11 – 2024-04-14 (×4): 10 mg via ORAL
  Filled 2024-04-11 (×3): qty 2

## 2024-04-11 MED ORDER — INSULIN ASPART 100 UNIT/ML IJ SOLN: 0.0000 [IU] | INTRAMUSCULAR | Status: DC

## 2024-04-11 MED ORDER — ASPIRIN 81 MG PO TBEC
81.0000 mg | DELAYED_RELEASE_TABLET | Freq: Every day | ORAL | Status: DC
  Administered 2024-04-11 – 2024-04-14 (×4): 81 mg via ORAL
  Filled 2024-04-11 (×3): qty 1

## 2024-04-11 MED ORDER — PREDNISONE 20 MG PO TABS
40.0000 mg | ORAL_TABLET | Freq: Every day | ORAL | Status: DC
  Administered 2024-04-12: 40 mg via ORAL
  Filled 2024-04-11: qty 2

## 2024-04-11 MED ORDER — INSULIN ASPART 100 UNIT/ML IJ SOLN
0.0000 [IU] | Freq: Every day | INTRAMUSCULAR | Status: DC
  Administered 2024-04-11: 3 [IU] via SUBCUTANEOUS
  Administered 2024-04-12: 4 [IU] via SUBCUTANEOUS
  Filled 2024-04-11: qty 4
  Filled 2024-04-11: qty 3

## 2024-04-11 MED ORDER — THIAMINE MONONITRATE 100 MG PO TABS: 100.0000 mg | ORAL_TABLET | Freq: Every day | ORAL | Status: DC

## 2024-04-11 MED ORDER — FOLIC ACID 1 MG PO TABS
1.0000 mg | ORAL_TABLET | Freq: Every day | ORAL | Status: DC
  Administered 2024-04-11 – 2024-04-14 (×4): 1 mg via ORAL
  Filled 2024-04-11 (×3): qty 1

## 2024-04-11 MED ORDER — LIDOCAINE 5 % EX PTCH
1.0000 | MEDICATED_PATCH | CUTANEOUS | Status: DC
  Administered 2024-04-11 – 2024-04-14 (×4): 1 via TRANSDERMAL
  Filled 2024-04-11 (×3): qty 1

## 2024-04-11 MED ORDER — INSULIN ASPART 100 UNIT/ML IJ SOLN: 0.0000 [IU] | Freq: Three times a day (TID) | INTRAMUSCULAR | Status: DC

## 2024-04-11 MED ORDER — LORAZEPAM 1 MG PO TABS
1.0000 mg | ORAL_TABLET | ORAL | Status: DC | PRN
  Administered 2024-04-11: 1 mg via ORAL
  Administered 2024-04-12: 2 mg via ORAL
  Filled 2024-04-11 (×3): qty 1

## 2024-04-11 MED ORDER — ADULT MULTIVITAMIN W/MINERALS CH
1.0000 | ORAL_TABLET | Freq: Every day | ORAL | Status: DC
  Administered 2024-04-11 – 2024-04-14 (×4): 1 via ORAL
  Filled 2024-04-11 (×3): qty 1

## 2024-04-11 MED ORDER — LORAZEPAM 1 MG PO TABS: 1.0000 mg | ORAL_TABLET | ORAL | Status: DC | PRN

## 2024-04-11 NOTE — Progress Notes (Signed)
" °   04/11/24 2119  BiPAP/CPAP/SIPAP  BiPAP/CPAP/SIPAP Pt Type Adult  BiPAP/CPAP/SIPAP SERVO  Reason BIPAP/CPAP not in use Other(comment) (Pt comfortabe on HFNC, no respiratory distress noted. BiPAP not indicated at this time.)    "

## 2024-04-11 NOTE — Progress Notes (Signed)
 "  NAME:  Paul [name], MRN:  [id number], DOB:  October 02, 1950, LOS: 1 ADMISSION DATE:  04/10/2024, CONSULTATION DATE:  04/10/24 REFERRING MD:  Dr. Tobie, CHIEF COMPLAINT:  SOB   History of Present Illness:   35 yoM with PMH of COPD, former smoker, CAD, HFpEF, cirrhosis with some ongoing ETOH use, GERD, BPH, DMT2 presented with 2-day history of shortness of breath, cough, one episode of emesis, subjective fever and chills.  Some chronic cough unable to tell me a productive.  History limited as patient on BiPAP, denies any recent sick contacts, chest pain, abdominal pain, diarrhea, lower extremity pain or edema, urinary symptoms.  States he drinks 2 to days a week, former smoker quit 25 years ago, denies chronic pain or opiate use although on chart review he is on Oxy and seen at pain clinic.   In ER, febrile 101.4, tachycardic, initially normotensive, and initial sat 90% on RA.  Placed on BiPAP due to WOB.  Labs noted for WBC 20.7, sCr 2.34, bicarb 17, glucose 196, Mag 1.4, normal LFTs, pBNP 1886, trop 27, VBG 7.4/ 27/ 48/ 18 and iCa 1.01.  CXR showing RLL PNA.  SARS/ flu/ RSV neg.  EKG non acute, ST.  BC sent, started on ceftriaxone , azithro, treated with nebs, solumedrol, magnesium , and 1L LR.  Pt taken off BiPAP as he does not like mask and had improved, but soon developed recurrent WOB, placed back on, lactic 6.9.  PCCM called for admit.   Pertinent  Medical History   Past Medical History:  Diagnosis Date   Angina, class III    Anxiety    Arthritis    Bilateral carotid artery stenosis    BPH without obstruction/lower urinary tract symptoms    CAD 11/24/2011   a) Mild-to-moderate 30-40% lesions in the RCA, LAD and Circumflex. b) CULPRIT LESION: long tubular 70-80% lesion in D1 with FFR of 0.7 --> PCI w/ Xience Xpedition DES 2.5 mm x 30 mm (2.65 MM); c) Lexiscan  Myoview  11/2013: No Ischemia or Infarct (Inferior Gut Attenuation) EF 63%.   Chronic fatigue syndrome 06/13/2021   Chronic heart failure  (HCC)    Chronic heart failure with preserved ejection fraction (HFpEF) (HCC)    confirmed with echocardiogram   Chronic pain syndrome    Cirrhosis (HCC)    Constipation by delayed colonic transit 06/13/2021   Diverticulosis    Dysphagia    Elevated alkaline phosphatase level 08/17/2022   Elevated levels of transaminase & lactic acid dehydrogenase 06/09/2016   Exertional dyspnea, chronic    Fall 02/16/2023   Former smoker    GERD (gastroesophageal reflux disease)    Gout    H/O: GI bleed    Hearing loss    Hypo-osmolality and hyponatremia 03/11/2019   Hypoglycemia 03/18/2023   Hypotension due to drugs 10/09/2022   Iron  deficiency anemia    Lipoprotein deficiency disorder    Long term (current) use of insulin  (HCC) 07/15/2019   Major depressive disorder, recurrent    Malnutrition of moderate degree 03/19/2023   Obesity (BMI 30.0-34.9)    OSA (obstructive sleep apnea), uses oxygen  at home did not tolerate cpap 11/24/2011   Pain due to onychomycosis of toenails of both feet 08/23/2022   Pain in both lower extremities 02/14/2022   Paresthesia of skin 02/17/2015   Peripheral vascular disease    Portal hypertension (HCC)    Right hip pain 06/13/2021   S/P CABG (coronary artery bypass graft)    Shoulder joint pain 02/17/2015   Spinal  stenosis    Splenomegaly    Thrombocytopenia    Type 2 diabetes mellitus with stage 3 chronic kidney disease (HCC)    Type 2 diabetes mellitus with stage 3 chronic kidney disease, with long-term current use of insulin , unspecified whether stage 3a or 3b CKD (HCC)    Type II diabetes mellitus with peripheral circulatory disorder (HCC)    Vitamin D  deficiency    Significant Hospital Events: Including procedures, antibiotic start and stop dates in addition to other pertinent events   12/25 Admit hypoxic resp failure, BiPAP, RLL PNA, AKI, lactic acidosis   Interim History / Subjective:  Overnight admitted to ICU on BiPAP. Transitioned to 5L  [place] Weaned off levophed  after fluid resuscitation after 2.5 L IVF in last 24 hours Reports improved SOB However had worsening anxiety. Reports alcohol  use but unsure how much Objective    Blood pressure 103/62, pulse (!) 109, temperature 98.2 F (36.8 C), temperature source Oral, resp. rate (!) 22, height 5' 5 (1.651 m), weight 79.3 kg, SpO2 90%.    FiO2 (%):  [60 %] 60 % PEEP:  [5 cmH20] 5 cmH20 Pressure Support:  [5 cmH20-8 cmH20] 8 cmH20   Intake/Output Summary (Last 24 hours) at 04/11/2024 0954 Last data filed at 04/11/2024 0900 Gross per 24 hour  Intake 3352.91 ml  Output 1900 ml  Net 1452.91 ml   Filed Weights   04/10/24 1317 04/11/24 0500  Weight: 78.9 kg 79.3 kg   Physical Exam: General: Chronically ill-appearing, no acute distress HENT: [place], AT, OP clear, MMM Eyes: EOMI, no scleral icterus Respiratory: Diminished but clear to auscultation bilaterally.  No crackles, wheezing or rales Cardiovascular: Mild tachycardia, RR, -M/R/G, no JVD GI: BS+, distended, soft, nontender Extremities:-Edema,-tenderness Neuro: AAO x4, CNII-XII grossly intact Psych: Normal mood, normal affect GU: No foley in place  Imaging, labs and test in EMR in the last 24 hours reviewed independently by me. Pertinent findings below: ABG 7.427/27/48  CO2 16 BUN/Cr 40/2.17 Trop flat 27>23 BNP 1886 LA >9  Urine strep +  Resolved problem list   Assessment and Plan   Acute hypoxic respiratory failure 2/2 RLL strep pneumo PNA - improving AECOPD - Wean [place] for goal >88% - BiPAP PRN - Continue Ceftriaxone  and azithromycin  - OK to intubate for worsening respiratory status. No CPR - Full RVP pending - Continue nebs: Start pulmicort , cont Brovana  and Duonebs, stop yupelri  - Transition to PO prednisone  x 5 days total  Possible some ETOH +/- opiate - Start PO CIWA protocol with PRN ativan  - May need to initiate Precedex  - Folic acid , thiamine , multivitamin  Septic shock 2/2 strep pna PNA. UA  neg. Abd exam benign. Shock resolved Lactic acidosis -S/p 2.5 L -Off levophed  -Trend LA -F/u cultures  AKI slightly improved. UOP 1.6L Hypocalcemia - improved Hypomagnesia - improved - Trend UOP/Cr - trend renal indices  - strict I/Os, daily wts - avoid nephrotoxins, renal dose meds, hemodynamic support as above - Preferred narcotic agents for pain control are hydromorphone , fentanyl , and methadone. Morphine  should not be used.  Avoid Baclofen and avoid oral sodium phosphate and magnesium  citrate based laxatives / bowel preps.   CAD HLD HTN -Restart ASA and statin -Hold valsartan , jardiance   DMT2 with hyperglycemia  - A1c 01/2024 7.4 - CBG q 4 with prn mSSI, goal 140-180.   - Restarted lantus , SSI  GERD - PPI  Cirrhosis - no clinical evidence of SBP ETOH use- admits to ETOH ~2 days/ week - CIWA with PO  ativan . Precedex  if needed - empiric thiamine    Chronic pain - DC fentanyl  - Restarted renally dose home gabapentin  - Restarted home norco  GOC - pt does not want CPR.  Ok with short term intubation. - lives with a friend, said his brother is NOK       The patient is critically ill with multiple organ systems failure and requires high complexity decision making for assessment and support, frequent evaluation and titration of therapies, application of advanced monitoring technologies and extensive interpretation of multiple databases.  Independent Critical Care Time: 35 Minutes.   Slater Staff, M.D. [facility] Pulmonary/Critical Care Medicine 04/11/2024 9:54 AM   Please see Amion for pager number to reach on-call Pulmonary and Critical Care Team.        "

## 2024-04-11 NOTE — Plan of Care (Signed)
" °  Problem: Education: Goal: Ability to describe self-care measures that may prevent or decrease complications (Diabetes Survival Skills Education) will improve Outcome: Progressing Goal: Individualized Educational Video(s) Outcome: Progressing   Problem: Coping: Goal: Ability to adjust to condition or change in health will improve Outcome: Progressing   Problem: Fluid Volume: Goal: Ability to maintain a balanced intake and output will improve Outcome: Progressing   Problem: Metabolic: Goal: Ability to maintain appropriate glucose levels will improve Outcome: Progressing   Problem: Nutritional: Goal: Maintenance of adequate nutrition will improve Outcome: Progressing Goal: Progress toward achieving an optimal weight will improve Outcome: Progressing   Problem: Skin Integrity: Goal: Risk for impaired skin integrity will decrease Outcome: Progressing   Problem: Tissue Perfusion: Goal: Adequacy of tissue perfusion will improve Outcome: Progressing   Problem: Education: Goal: Knowledge of General Education information will improve Description: Including pain rating scale, medication(s)/side effects and non-pharmacologic comfort measures Outcome: Progressing   Problem: Clinical Measurements: Goal: Ability to maintain clinical measurements within normal limits will improve Outcome: Progressing Goal: Will remain free from infection Outcome: Progressing Goal: Diagnostic test results will improve Outcome: Progressing Goal: Respiratory complications will improve Outcome: Progressing Goal: Cardiovascular complication will be avoided Outcome: Progressing   Problem: Activity: Goal: Risk for activity intolerance will decrease Outcome: Progressing   Problem: Nutrition: Goal: Adequate nutrition will be maintained Outcome: Progressing   Problem: Coping: Goal: Level of anxiety will decrease Outcome: Progressing   Problem: Elimination: Goal: Will not experience complications  related to bowel motility Outcome: Progressing Goal: Will not experience complications related to urinary retention Outcome: Progressing   Problem: Pain Managment: Goal: General experience of comfort will improve and/or be controlled Outcome: Progressing   Problem: Safety: Goal: Ability to remain free from injury will improve Outcome: Progressing   Problem: Skin Integrity: Goal: Risk for impaired skin integrity will decrease Outcome: Progressing   "

## 2024-04-11 NOTE — Care Management (Deleted)
 Transition of Care [facility]) - Inpatient Brief Assessment   Patient Details  Name: Paul [name] MRN: [id number] Date of Birth: 1950/07/12  [facility] of Care [facility]) CM/SW Contact:    Corean JAYSON Canary, RN Phone Number: 04/11/2024, 11:36 AM   Clinical Narrative:  73 year old history of COPD, cirrhosis presented with  shortness of breath, placed on BiPap History of home health in 2023 with [facility], on precedex  weaned off levophed .  IPCM will follow for needs, recommendations, and transitions of care Transition of Care Asessment: Insurance and Status: Insurance coverage has been reviewed Patient has primary care physician: Yes Home environment has been reviewed: [facility] Prior level of function:: Independent Prior/Current Home Services: No current home services Social Drivers of Health Review:  (Not assessed yet) Readmission risk has been reviewed: Yes Transition of care needs: transition of care needs identified, TOC will continue to follow

## 2024-04-11 NOTE — Inpatient Diabetes Management (Signed)
 Inpatient Diabetes Program Recommendations  AACE/ADA: New Consensus Statement on Inpatient Glycemic Control (2015)  Target Ranges:  Prepandial:   less than 140 mg/dL      Peak postprandial:   less than 180 mg/dL (1-2 hours)      Critically ill patients:  140 - 180 mg/dL   Lab Results  Component Value Date   GLUCAP 282 (H) 04/11/2024   HGBA1C 7.4 (H) 02/08/2024    Review of Glycemic Control  Latest Reference Range & Units 04/10/24 20:11 04/10/24 23:33 04/11/24 03:21 04/11/24 07:25 04/11/24 11:24  Glucose-Capillary 70 - 99 mg/dL 692 (H) 654 (H) 660 (H) 290 (H) 282 (H)   Diabetes history: DM 2 Outpatient Diabetes medications: Jardiance  10 mg Daily, Novolog , Toujeo  12 units, Metformin  1000 mg bid Current orders for Inpatient glycemic control:  Lantus  12 units Daily (added today) Novolog  0-20 units tid + hs (adjusted today) A1c 7.4% on 10/24  IV Solumedrol transitioned to PO Prednisone  40 mg Daily  Inpatient Diabetes Program Recommendations:    -   may also consider adding Novolog  4 units tid meal coverage while on steroids  Thanks,  Clotilda Bull RN, MSN, BC-ADM Inpatient Diabetes Coordinator Team Pager [phone] (8a-5p)

## 2024-04-11 NOTE — TOC Initial Note (Signed)
 Transition of Care [facility]) - Initial/Assessment Note    Patient Details  Name: Paul [name] MRN: [id number] Date of Birth: 1951-04-13  Transition of Care [facility]) CM/SW Contact:    Lauraine FORBES Saa, LCSWA Phone Number: 04/11/2024, 11:56 AM  Clinical Narrative:                  11:56 AM CSW introduced self and role to patient. Patient informed CSW that he resides at home with his friend. Patient stated he drives self to appointments. Patient stated that he uses DME (cane, oxygen  machine, shower chair, has RW that he does not use) at home but expressed interest in a new shower chair and oxygen  tanks the same as the ones he has currently at bedside. RNCM was made aware of DME interest. Patient confirmed SNF history with [facility]. Patient confirmed HH history (per chart review, was with [facility]). Patient declined CSW offer of substance use resources. Per chart review, patient has CIR history with [facility]. Per chart review, patient has a PCP and insurance. TOC will continue to follow.  Expected Discharge Plan: Home/Self Care Barriers to Discharge: Continued Medical Work up   Patient Goals and CMS Choice Patient states their goals for this hospitalization and ongoing recovery are:: to return home          Expected Discharge Plan and Services   Discharge Planning Services: CM Consult   Living arrangements for the past 2 months: Single Family Home                                      Prior Living Arrangements/Services Living arrangements for the past 2 months: Single Family Home Lives with:: Roommate Patient language and need for interpreter reviewed:: Yes Do you feel safe going back to the place where you live?: Yes      Need for Family Participation in Patient Care: No (Comment)   Current home services: DME Criminal Activity/Legal Involvement Pertinent to Current Situation/Hospitalization: No - Comment as needed  Activities of Daily Living      Permission  Sought/Granted Permission sought to share information with : Family Supports Permission granted to share information with : No (Contact information on chart)  Share Information with NAME: Conroy Goracke     Permission granted to share info w Relationship: Brother  Permission granted to share info w Contact Information: [phone]  Emotional Assessment Appearance:: Appears stated age Attitude/Demeanor/Rapport: Engaged Affect (typically observed): Accepting, Appropriate, Adaptable, Calm, Stable, Pleasant Orientation: : Oriented to Self, Oriented to Place, Oriented to  Time, Oriented to Situation Alcohol  / Substance Use: Not Applicable Psych Involvement: No (comment)  Admission diagnosis:  COPD exacerbation (HCC) [J44.1] Respiratory failure with hypoxia (HCC) [J96.91] Acute respiratory failure with hypoxia (HCC) [J96.01] AKI (acute kidney injury) [N17.9] Sepsis, due to unspecified organism, unspecified whether acute organ dysfunction present [facility]) [A41.9] Patient Active Problem List   Diagnosis Date Noted   Respiratory failure with hypoxia (HCC) 04/10/2024   Type 2 diabetes mellitus with stage 3a chronic kidney disease, with long-term current use of insulin  (HCC) 02/08/2024   Type II diabetes mellitus with peripheral circulatory disorder (HCC)    Type 2 diabetes mellitus with stage 3 chronic kidney disease, with long-term current use of insulin , unspecified whether stage 3a or 3b CKD (HCC)    Goals of care, counseling/discussion 11/06/2023   Testosterone  deficiency in male 07/18/2023   COPD mixed type (  HCC)    Polypharmacy 02/16/2023   Squamous cell carcinoma in situ (SCCIS) of dorsum of hand 01/12/2023   Spinal stenosis 10/09/2022   Hallux limitus of right foot 08/23/2022   B12 deficiency 08/17/2022   Iron  deficiency anemia 04/16/2022   Chronic heart failure with preserved ejection fraction (HFpEF) (HCC) 12/08/2021   BPH without obstruction/lower urinary tract symptoms 06/13/2021    Chronic pain of both shoulders 06/13/2021   Diverticulosis 06/13/2021   Right hip pain 06/13/2021   Persistent insomnia 06/13/2021   Generalized weakness 06/13/2021   S/P CABG x 2 01/25/2021   Angina, class III 12/28/2020   Chronic kidney disease, stage 3a (HCC) 03/28/2019   Leukocytosis 03/11/2019   Vitamin D  deficiency 12/31/2018   Lipoprotein deficiency disorder 08/09/2018   Generalized osteoarthritis of multiple sites 07/29/2018   Chronic pain syndrome 07/25/2018   Hearing loss 06/13/2018   Diabetic polyneuropathy associated with type 2 diabetes mellitus (HCC) 05/28/2018   DM (diabetes mellitus), type 2 with complications (HCC)    Community acquired pneumonia of right lower lobe of lung 04/21/2018   Allergic rhinitis 10/31/2017   Claudication of calf muscles 06/22/2017   Spondylolisthesis, congenital 02/22/2017   Lumbago with sciatica 01/15/2017   GERD (gastroesophageal reflux disease) 03/02/2016   CAD (coronary artery disease), native coronary artery 02/17/2015   Hypertension associated with diabetes (HCC) 02/17/2015   Gout 02/17/2015   DDD (degenerative disc disease), cervical 03/10/2013   Major depressive disorder, recurrent 03/10/2013   Obesity (BMI 30.0-34.9)    Anxiety 05/02/2012   Hyperlipidemia associated with type 2 diabetes mellitus (HCC) 11/24/2011   Former smoker 11/24/2011   OSA and COPD overlap syndrome (HCC) 11/24/2011   PCP:  Oris Camie BRAVO, NP Pharmacy:   [facility] - [facility] [address] [place] [address] Phone: [phone] Fax: [phone]  [facility] - [place], [place] - [address] [place] [address] Phone: [phone] Fax: [phone]  [facility] - [place], [place] - [address] [place] [address] Phone: [phone] Fax: [phone]     Social Drivers of Health (SDOH) Social History: SDOH Screenings   Food  Insecurity: No Food Insecurity (03/17/2023)  Housing: Low Risk (03/17/2023)  Transportation Needs: No Transportation Needs (03/17/2023)  Utilities: Not At Risk (03/17/2023)  Alcohol  Screen: Low Risk (11/21/2022)  Depression (PHQ2-9): Low Risk (02/08/2024)  Financial Resource Strain: Low Risk (11/21/2022)  Physical Activity: Inactive (11/21/2022)  Social Connections: Socially Isolated (11/21/2022)  Stress: No Stress Concern Present (11/21/2022)  Tobacco Use: Medium Risk (04/10/2024)  Health Literacy: Adequate Health Literacy (11/21/2022)   SDOH Interventions:     Readmission Risk Interventions     No data to display

## 2024-04-12 DIAGNOSIS — J9621 Acute and chronic respiratory failure with hypoxia: Secondary | ICD-10-CM

## 2024-04-12 LAB — BASIC METABOLIC PANEL WITH GFR
Anion gap: 11 (ref 5–15)
BUN: 54 mg/dL — ABNORMAL HIGH (ref 8–23)
CO2: 20 mmol/L — ABNORMAL LOW (ref 22–32)
Calcium: 8.4 mg/dL — ABNORMAL LOW (ref 8.9–10.3)
Chloride: 102 mmol/L (ref 98–111)
Creatinine, Ser: 1.45 mg/dL — ABNORMAL HIGH (ref 0.61–1.24)
GFR, Estimated: 50 mL/min — ABNORMAL LOW
Glucose, Bld: 251 mg/dL — ABNORMAL HIGH (ref 70–99)
Potassium: 4.5 mmol/L (ref 3.5–5.1)
Sodium: 133 mmol/L — ABNORMAL LOW (ref 135–145)

## 2024-04-12 LAB — CBC
HCT: 34.4 % — ABNORMAL LOW (ref 39.0–52.0)
Hemoglobin: 11.3 g/dL — ABNORMAL LOW (ref 13.0–17.0)
MCH: 28.5 pg (ref 26.0–34.0)
MCHC: 32.8 g/dL (ref 30.0–36.0)
MCV: 86.6 fL (ref 80.0–100.0)
Platelets: 103 K/uL — ABNORMAL LOW (ref 150–400)
RBC: 3.97 MIL/uL — ABNORMAL LOW (ref 4.22–5.81)
RDW: 15.4 % (ref 11.5–15.5)
WBC: 12.1 K/uL — ABNORMAL HIGH (ref 4.0–10.5)
nRBC: 0 % (ref 0.0–0.2)

## 2024-04-12 LAB — GLUCOSE, CAPILLARY
Glucose-Capillary: 294 mg/dL — ABNORMAL HIGH (ref 70–99)
Glucose-Capillary: 259 mg/dL — ABNORMAL HIGH (ref 70–99)
Glucose-Capillary: 261 mg/dL — ABNORMAL HIGH (ref 70–99)
Glucose-Capillary: 327 mg/dL — ABNORMAL HIGH (ref 70–99)

## 2024-04-12 MED ORDER — ZOLPIDEM TARTRATE 5 MG PO TABS
10.0000 mg | ORAL_TABLET | Freq: Every day | ORAL | Status: DC
  Administered 2024-04-12 – 2024-04-13 (×2): 10 mg via ORAL
  Filled 2024-04-12 (×2): qty 2

## 2024-04-12 MED ORDER — BUDESON-GLYCOPYRROL-FORMOTEROL 160-9-4.8 MCG/ACT IN AERO
2.0000 | INHALATION_SPRAY | Freq: Two times a day (BID) | RESPIRATORY_TRACT | Status: DC
  Administered 2024-04-12 – 2024-04-14 (×4): 2 via RESPIRATORY_TRACT
  Filled 2024-04-12: qty 5.9

## 2024-04-12 MED ORDER — INSULIN GLARGINE 100 UNIT/ML ~~LOC~~ SOLN
12.0000 [IU] | Freq: Two times a day (BID) | SUBCUTANEOUS | Status: DC
  Administered 2024-04-12 – 2024-04-14 (×4): 12 [IU] via SUBCUTANEOUS
  Filled 2024-04-12 (×5): qty 0.12

## 2024-04-12 NOTE — Hospital Course (Addendum)
 73 y.o. M with COPD not on home O2, liver cirrhosis still drinking, CAD s/p CABG 2022, OSA not on CPAP, obesity, dCHF, HTN, and DM who presented with SOB, respiratory distress and fever. Found to have RLL pneumonia, admitted to ICU on BiPAP.    Sepsis due to pneumonia due to Streptococcus pneumoniae Presented with fever, tachycardia, respiratory failure and AKI.  CXR with RLL lobar pneumonia.  COVID, flu, RSV, 20-plex negative.  Urine strep antigen +.     Acute respiratory failure due to pneumonia Presented with respiratory distress, SpO2 in the 80s on room air.  Required BiPAP on admission, now weaned to 2L.   COPD exacerbation  Acute renal failure  Admitted with Creatinine 2.34, doubled from baseline 1.1.  Treated with fluids and resolving to 1.4 today   Cirrhosis   Chronic diastolic CHF Coronary artery disease Hypertension  Diabetes with hyperglycemia  Hyponatremia  OSA Obesity class 1

## 2024-04-12 NOTE — Progress Notes (Signed)
" °  Progress Note   Patient: Paul [name] FMW:[id number] DOB: 03-02-1951 DOA: 04/10/2024     2 DOS: the patient was seen and examined on 04/12/2024 at 8:44AM      Brief hospital course: 73 y.o. M with COPD not on home O2, liver cirrhosis still drinking, CAD s/p CABG 2022, OSA not on CPAP, obesity, dCHF, HTN, and DM who presented with SOB, respiratory distress and fever. Found to have RLL pneumonia, admitted to ICU on BiPAP.    Assessment and plan: Sepsis due to pneumonia due to Streptococcus pneumoniae Presented with fever, tachycardia, respiratory failure, encephalopathy and AKI.  CXR with RLL lobar pneumonia.  COVID, flu, RSV, 20-plex negative.  Urine strep antigen +.   -Continue Rocephin  and azithromycin , day 3 - Add flutter and incentive spirometry   Acute respiratory failure due to pneumonia Presented with respiratory distress, SpO2 in the 80s on room air.  Required BiPAP on admission, now weaned to 2L.  Acute metabolic encephalopathy Due to sepsis - Delirium precautions  COPD exacerbation On Breztri  as an outpatient, no significant wheezing here today - Continue Brovana , Pulmicort  - Continue prednisone  - As needed bronchodilators  Acute renal failure  Admitted with Creatinine 2.34, doubled from baseline 1.1.  Treated with fluids and resolving to 1.4 today -Avoid nephrotoxins -Hold valsartan , Jardiance   Cirrhosis No overt evidence of decompensated cirrhosis  History of alcohol  use No withdrawal symptoms since arrival, delirium likely from sepsis not DTs, no previous history of complicated withdrawal - Maintain on CIWA scoring for now  Chronic diastolic CHF Coronary artery disease Hypertension Appears euvolemic, blood pressure normal - Continue low-dose aspirin , Crestor  - Hold valsartan , metoprolol , Jardiance   Diabetes controlled with hyperglycemia Hemoglobin A1c at goal for age at 7.4% last Oct. Hyperglycemic here on steroids - Continue sliding scale  corrections - Hold Jardiance , metformin  - Increase glargine - Continue home gabapentin   Hyponatremia Stable  OSA Obesity class 1 Does not use CPAP at home, obesity complicates care   Thrombocytopenia Due to sepsis - Trend platelets - Okay to continue heparin  ppx for now       Subjective: Patient is feeling confused about where he is.  Still coughing, no shortness of breath or chest pain.  No wheezing.     Physical Exam: BP (!) 119/58 (BP Location: Right Arm)   Pulse 87   Temp 97.8 F (36.6 C) (Oral)   Resp 19   Ht 5' 5 (1.651 m)   Wt 79.3 kg   SpO2 91%   BMI 29.09 kg/m   Adult male, sitting up in recliner, interactive, responsive to questions RRR, no murmurs, no peripheral edema Respiratory rate seems normal, rales in the right base, no wheezing at all Abdomen soft, no tenderness palpation or guarding Attentive to questions, responsive, oriented to self, not place or situation, unable to tell where he is or why he is in the hospital, movements in all 4 extremities seem normal    Data Reviewed: Basic metabolic panel shows resolving renal failure, hyponatremia CBC shows improving leukocytosis, anemia, mild thrombocytopenia     Family Communication:     Disposition: Status is: Inpatient         Author: Lonni SHAUNNA Dalton, MD 04/12/2024 11:41 AM  For on call review [email or web address].    "

## 2024-04-13 DIAGNOSIS — J9621 Acute and chronic respiratory failure with hypoxia: Secondary | ICD-10-CM | POA: Diagnosis not present

## 2024-04-13 LAB — BASIC METABOLIC PANEL WITH GFR
Anion gap: 10 (ref 5–15)
BUN: 51 mg/dL — ABNORMAL HIGH (ref 8–23)
CO2: 21 mmol/L — ABNORMAL LOW (ref 22–32)
Calcium: 8.5 mg/dL — ABNORMAL LOW (ref 8.9–10.3)
Chloride: 101 mmol/L (ref 98–111)
Creatinine, Ser: 1.25 mg/dL — ABNORMAL HIGH (ref 0.61–1.24)
GFR, Estimated: >60 mL/min
Glucose, Bld: 291 mg/dL — ABNORMAL HIGH (ref 70–99)
Potassium: 4.3 mmol/L (ref 3.5–5.1)
Sodium: 132 mmol/L — ABNORMAL LOW (ref 135–145)

## 2024-04-13 LAB — GLUCOSE, CAPILLARY
Glucose-Capillary: 275 mg/dL — ABNORMAL HIGH (ref 70–99)
Glucose-Capillary: 241 mg/dL — ABNORMAL HIGH (ref 70–99)
Glucose-Capillary: 184 mg/dL — ABNORMAL HIGH (ref 70–99)
Glucose-Capillary: 219 mg/dL — ABNORMAL HIGH (ref 70–99)
Glucose-Capillary: 188 mg/dL — ABNORMAL HIGH (ref 70–99)

## 2024-04-13 LAB — CBC
HCT: 33.9 % — ABNORMAL LOW (ref 39.0–52.0)
Hemoglobin: 11 g/dL — ABNORMAL LOW (ref 13.0–17.0)
MCH: 28.4 pg (ref 26.0–34.0)
MCHC: 32.4 g/dL (ref 30.0–36.0)
MCV: 87.6 fL (ref 80.0–100.0)
Platelets: 101 K/uL — ABNORMAL LOW (ref 150–400)
RBC: 3.87 MIL/uL — ABNORMAL LOW (ref 4.22–5.81)
RDW: 14.9 % (ref 11.5–15.5)
WBC: 10.9 K/uL — ABNORMAL HIGH (ref 4.0–10.5)
nRBC: 0 % (ref 0.0–0.2)

## 2024-04-13 MED ORDER — IPRATROPIUM-ALBUTEROL 0.5-2.5 (3) MG/3ML IN SOLN: 3.0000 mL | Freq: Four times a day (QID) | RESPIRATORY_TRACT | Status: DC | PRN

## 2024-04-13 MED ORDER — METOPROLOL TARTRATE 25 MG PO TABS
25.0000 mg | ORAL_TABLET | Freq: Two times a day (BID) | ORAL | Status: DC
  Administered 2024-04-13 – 2024-04-14 (×2): 25 mg via ORAL
  Filled 2024-04-13: qty 1

## 2024-04-13 NOTE — Progress Notes (Signed)
 SATURATION QUALIFICATIONS: (This note is used to comply with regulatory documentation for home oxygen )  Patient Saturations on Room Air at Rest = 86%  Patient Saturations on 3 Liters of oxygen  while Ambulating = 89%  Please briefly explain why patient needs home oxygen : Pt failed alternative measures and requires supplemental oxygen  to maintain SpO2 >88%.   Kate ORN, PT, DPT Secure Chat Preferred  Rehab Office [phone]

## 2024-04-13 NOTE — Progress Notes (Signed)
" °  Progress Note   Patient: Paul [name] FMW:[id number] DOB: [birth year] DOA: 04/10/2024     3 DOS: the patient was seen and examined on 04/13/2024 at 10:30 AM      Brief hospital course: 73 y.o. M with COPD not on home O2, liver cirrhosis still drinking, CAD s/p CABG 2022, OSA not on CPAP, obesity, dCHF, HTN, and DM who presented with SOB, respiratory distress and fever. Found to have RLL pneumonia, admitted to ICU on BiPAP.          Assessment and Plan: Sepsis from pneumonia Mentation not quite at baseline and still on oxygen  - Continue Rocephin  and azithromycin , day 4 - Continue flutter and incentive spirometry  See summary from 12/27  Acute renal failure Creatinine resolved to baseline - Hold valsartan  and Jardiance   COPD exacerbation - Continue Breztri , prednisone   CHF Coronary artery disease Hypertension Blood pressure trending up - Continue aspirin , Crestor  - Resume metoprolol  - Hold valsartan , Jardiance   Type 2 diabetes Glucoses still intermittently up - Continue increased dose glargine - Resume metformin  - Continue sliding scale corrections - Hold Jardiance  - Continue gabapentin   Thrombocytopenia Platelets relatively stable - Continue heparin        Subjective: Remains encephalopathic, still quite tired, still on oxygen       Physical Exam: BP (!) 149/67 (BP Location: Right Wrist)   Pulse 84   Temp (!) 97.5 F (36.4 C) (Oral)   Resp 18   Ht 5' 5 (1.651 m)   Wt 80.2 kg   SpO2 97%   BMI 29.42 kg/m   Adult male, lying in bed, sluggish and sleepy RRR, no murmurs, no peripheral edema Respiratory rate normal, coarse inspiratory and expiratory effort, lung sounds diminished, Rales improved in the right base Abdomen soft, no tenderness palpation or guarding, no ascites or distention Attention normal, affect normal, judgment insight appear normal     Data Reviewed: Patient metabolic panel shows hyponatremia, renal function improving,  glucose elevated, no acidosis CBC shows resolving leukocytosis, stable anemia, mild thrombocytopenia    Family Communication:     Disposition: Status is: Inpatient         Author: Lonni SHAUNNA Dalton, MD 04/13/2024 5:06 PM  For on call review [email or web address]    "

## 2024-04-13 NOTE — Evaluation (Signed)
 Physical Therapy Evaluation Patient Details Name: Paul [name] MRN: [id number] DOB: Mar 22, 1951 Today's Date: 04/13/2024  History of Present Illness  73 y.o. male presents to [facility] 04/10/24 with respiratory distress and fever. Workup revealed sepsis 2/2 RLL lobar PNA, COPD exacerbation, and ARF. 12/25-12/26 ICU stay. PMH includes: HTN, COPD, cirrhosis, alcohol  use, DM II, CAD s/p CABGx2, DDD and obesity.   Clinical Impression  Pt lives at home with a friend and occasionally needs assist for bed mobility. Pt was able to ambulate short distances with use of quad cane with hx of multiple falls. Pt presents below mobility baseline with impaired cardiopulmonary status, generalized weakness, decreased activity tolerance, and impaired balance/gait pattern. Pt required MinA for bed mobility and CGA to stand and ambulate 180 ft with RW. Further distance limited by fatigue. Discussed setting up chairs throughout the house for energy conservation and utilizing RW with pt verbalizing agreement. Pt has intermittent assist available upon d/c home. Recommending home with HHPT. Acute PT to follow.  86% SpO2 on RA 89-91% SpO2 on 3L during gait 92% SpO2 on 3L at rest after mobility      If plan is discharge home, recommend the following: A little help with walking and/or transfers;A little help with bathing/dressing/bathroom;Assist for transportation;Help with stairs or ramp for entrance   Can travel by private vehicle    Yes    Equipment Recommendations None recommended by PT     Functional Status Assessment Patient has had a recent decline in their functional status and demonstrates the ability to make significant improvements in function in a reasonable and predictable amount of time.     Precautions / Restrictions Precautions Precautions: Fall Recall of Precautions/Restrictions: Intact Restrictions Weight Bearing Restrictions Per Provider Order: No      Mobility  Bed Mobility Overal bed  mobility: Needs Assistance Bed Mobility: Supine to Sit    Supine to sit: Min assist  General bed mobility comments: MinA via 1HH for trunk elevation    Transfers Overall transfer level: Needs assistance Equipment used: Rolling walker (2 wheels) Transfers: Sit to/from Stand Sit to Stand: Contact guard assist  General transfer comment: CGA for safety with increased time and effort. Cues for hand placement    Ambulation/Gait Ambulation/Gait assistance: Contact guard assist Gait Distance (Feet): 180 Feet Assistive device: Rolling walker (2 wheels) Gait Pattern/deviations: Step-through pattern, Decreased stride length Gait velocity: decr     General Gait Details: increased medial/lateral sway with heavy reliance on UE support. No overt LOB     Balance Overall balance assessment: Needs assistance, Mild deficits observed, not formally tested, History of Falls Sitting-balance support: No upper extremity supported, Feet supported Sitting balance-Leahy Scale: Good     Standing balance support: Bilateral upper extremity supported, During functional activity, Reliant on assistive device for balance Standing balance-Leahy Scale: Poor Standing balance comment: reliant on UE support       Pertinent Vitals/Pain Pain Assessment Pain Assessment: No/denies pain    Home Living Family/patient expects to be discharged to:: Private residence Living Arrangements: Non-relatives/Friends Available Help at Discharge: Family;Available PRN/intermittently;Friend(s) Type of Home: House Home Access: Stairs to enter Entrance Stairs-Rails: Can reach both Entrance Stairs-Number of Steps: 3   Home Layout: One level (2 steps to bedroom) Home Equipment: Rollator (4 wheels);Rolling Walker (2 wheels);Cane - quad Additional Comments: O2 PRN    Prior Function Prior Level of Function : Driving;Needs assist Mobility Comments: Ind w/ quad cane. Hx of multiple falls. Occasionally needs assist for bed  mobility from friend ADLs  Comments: ind with med mgmt and ADLs     Extremity/Trunk Assessment   Upper Extremity Assessment Upper Extremity Assessment: Defer to OT evaluation    Lower Extremity Assessment Lower Extremity Assessment: Generalized weakness    Cervical / Trunk Assessment Cervical / Trunk Assessment: Normal  Communication   Communication Communication: No apparent difficulties    Cognition Arousal: Alert Behavior During Therapy: WFL for tasks assessed/performed   PT - Cognitive impairments: No apparent impairments    Following commands: Intact       Cueing Cueing Techniques: Verbal cues      PT Assessment Patient needs continued PT services  PT Problem List Decreased strength;Decreased balance;Decreased activity tolerance;Decreased mobility       PT Treatment Interventions DME instruction;Gait training;Stair training;Functional mobility training;Therapeutic activities;Balance training;Therapeutic exercise;Neuromuscular re-education;Patient/family education    PT Goals (Current goals can be found in the Care Plan section)  Acute Rehab PT Goals Patient Stated Goal: to go home PT Goal Formulation: With patient Time For Goal Achievement: 04/27/24 Potential to Achieve Goals: Good    Frequency Min 2X/week        AM-PAC PT 6 Clicks Mobility  Outcome Measure Help needed turning from your back to your side while in a flat bed without using bedrails?: A Little Help needed moving from lying on your back to sitting on the side of a flat bed without using bedrails?: A Little Help needed moving to and from a bed to a chair (including a wheelchair)?: A Little Help needed standing up from a chair using your arms (e.g., wheelchair or bedside chair)?: A Little Help needed to walk in hospital room?: A Little Help needed climbing 3-5 steps with a railing? : A Little 6 Click Score: 18    End of Session Equipment Utilized During Treatment: Oxygen  Activity  Tolerance: Patient tolerated treatment well Patient left: in bed;with call bell/phone within reach;with bed alarm set Nurse Communication: Mobility status PT Visit Diagnosis: Unsteadiness on feet (R26.81);Other abnormalities of gait and mobility (R26.89);Muscle weakness (generalized) (M62.81);History of falling (Z91.81)    Time: 8856-8795 PT Time Calculation (min) (ACUTE ONLY): 21 min   Charges:   PT Evaluation $PT Eval Low Complexity: 1 Low   PT General Charges $$ ACUTE PT VISIT: 1 Visit       Kate ORN, PT, DPT Secure Chat Preferred  Rehab Office [phone]   Kate BRAVO Wendolyn 04/13/2024, 12:09 PM

## 2024-04-13 NOTE — Evaluation (Signed)
 Occupational Therapy Evaluation Patient Details Name: Paul [name] MRN: [id number] DOB: 08-23-1950 Today's Date: 04/13/2024   History of Present Illness   73 y.o. male presents to [facility] 04/10/24 with respiratory distress and fever. Workup revealed sepsis 2/2 RLL lobar PNA, COPD exacerbation, and ARF. 12/25-12/26 ICU stay. PMH includes: HTN, COPD, cirrhosis, alcohol  use, DM II, CAD s/p CABGx2, DDD and obesity.     Clinical Impressions Pt reports ind at baseline with ADLs and uses QC for functional mobility, lives with a friend who helps with IADLs. Pt close to baseline, currently needs up to min Af or ADLs, min A for bed mobility and transfers with RW, SpO2 89% at lowest on 3L with mobility. Pt presenting with impairments listed below, will follow acutely. Recommend HHOT at d/c.      If plan is discharge home, recommend the following:   A little help with walking and/or transfers;A little help with bathing/dressing/bathroom;Assistance with cooking/housework;Help with stairs or ramp for entrance;Assist for transportation     Functional Status Assessment   Patient has had a recent decline in their functional status and demonstrates the ability to make significant improvements in function in a reasonable and predictable amount of time.     Equipment Recommendations   Tub/shower seat     Recommendations for Other Services   PT consult     Precautions/Restrictions   Precautions Precautions: Fall Recall of Precautions/Restrictions: Intact Restrictions Weight Bearing Restrictions Per Provider Order: No     Mobility Bed Mobility Overal bed mobility: Needs Assistance Bed Mobility: Supine to Sit     Supine to sit: Min assist          Transfers Overall transfer level: Needs assistance Equipment used: Rolling walker (2 wheels) Transfers: Sit to/from Stand Sit to Stand: Contact guard assist                  Balance Overall balance assessment: Needs  assistance, Mild deficits observed, not formally tested, History of Falls Sitting-balance support: No upper extremity supported, Feet supported Sitting balance-Leahy Scale: Good     Standing balance support: Bilateral upper extremity supported, During functional activity, Reliant on assistive device for balance Standing balance-Leahy Scale: Poor Standing balance comment: reliant on UE support                           ADL either performed or assessed with clinical judgement   ADL Overall ADL's : Needs assistance/impaired Eating/Feeding: Set up   Grooming: Set up   Upper Body Bathing: Minimal assistance   Lower Body Bathing: Minimal assistance   Upper Body Dressing : Minimal assistance   Lower Body Dressing: Minimal assistance   Toilet Transfer: Ambulation;Rolling walker (2 wheels);Minimal assistance   Toileting- Clothing Manipulation and Hygiene: Minimal assistance       Functional mobility during ADLs: Minimal assistance;Rolling walker (2 wheels)       Vision   Vision Assessment?: No apparent visual deficits     Perception Perception: Not tested       Praxis Praxis: Not tested       Pertinent Vitals/Pain Pain Assessment Pain Assessment: No/denies pain     Extremity/Trunk Assessment Upper Extremity Assessment Upper Extremity Assessment: Generalized weakness   Lower Extremity Assessment Lower Extremity Assessment: Defer to PT evaluation   Cervical / Trunk Assessment Cervical / Trunk Assessment: Normal   Communication Communication Communication: No apparent difficulties   Cognition Arousal: Alert Behavior During Therapy: WFL for tasks assessed/performed Cognition:  No apparent impairments                               Following commands: Intact       Cueing  General Comments   Cueing Techniques: Verbal cues  VSS on 2-3L O2 with ambulation   Exercises     Shoulder Instructions      Home Living Family/patient  expects to be discharged to:: Private residence Living Arrangements: Non-relatives/Friends Available Help at Discharge: Family;Available PRN/intermittently Type of Home: House Home Access: Stairs to enter [employer] of Steps: 3 Entrance Stairs-Rails: Can reach both Home Layout: One level (2 steps to bedroom)     Bathroom Shower/Tub: Chief Strategy Officer: Standard     Home Equipment: Rollator (4 wheels);Rolling Walker (2 wheels);Cane - quad   Additional Comments: O2 PRN -- says he puts it on 1x/month just to make sure its working      Prior Functioning/Environment Prior Level of Function : Driving             Mobility Comments: quad cane ADLs Comments: ind with med mgmt and ADLs    OT Problem List: Decreased strength;Decreased range of motion;Decreased activity tolerance;Impaired balance (sitting and/or standing);Cardiopulmonary status limiting activity   OT Treatment/Interventions: Self-care/ADL training;Therapeutic exercise;Energy conservation;DME and/or AE instruction;Therapeutic activities;Patient/family education;Balance training      OT Goals(Current goals can be found in the care plan section)   Acute Rehab OT Goals Patient Stated Goal: none stated OT Goal Formulation: With patient Time For Goal Achievement: 04/27/24 Potential to Achieve Goals: Good ADL Goals Pt Will Perform Upper Body Dressing: with modified independence;sitting Pt Will Perform Lower Body Dressing: with modified independence;sitting/lateral leans;sit to/from stand Pt Will Transfer to Toilet: with modified independence;stand pivot transfer;bedside commode Pt Will Perform Tub/Shower Transfer: Tub transfer;Shower transfer;with modified independence;ambulating;shower seat Additional ADL Goal #1: pt will verbalize x3 energy conservation strategies in prep for ADLs   OT Frequency:  Min 2X/week    Co-evaluation              AM-PAC OT 6 Clicks Daily Activity      Outcome Measure Help from another person eating meals?: A Little Help from another person taking care of personal grooming?: A Little Help from another person toileting, which includes using toliet, bedpan, or urinal?: A Little Help from another person bathing (including washing, rinsing, drying)?: A Little Help from another person to put on and taking off regular upper body clothing?: A Little Help from another person to put on and taking off regular lower body clothing?: A Little 6 Click Score: 18   End of Session Equipment Utilized During Treatment: Rolling walker (2 wheels);Oxygen  Nurse Communication: Mobility status  Activity Tolerance: Patient tolerated treatment well Patient left: in bed;with call bell/phone within reach;with bed alarm set  OT Visit Diagnosis: Unsteadiness on feet (R26.81);Other abnormalities of gait and mobility (R26.89);Muscle weakness (generalized) (M62.81)                Time: 8856-8794 OT Time Calculation (min): 22 min Charges:  OT General Charges $OT Visit: 1 Visit OT Evaluation $OT Eval Low Complexity: 1 Low  Laneta POUR, OTD, OTR/L SecureChat Preferred Acute Rehab [phone]   Laneta POUR [place] 04/13/2024, 12:32 PM

## 2024-04-14 ENCOUNTER — Other Ambulatory Visit (HOSPITAL_COMMUNITY): Payer: Self-pay

## 2024-04-14 DIAGNOSIS — J9621 Acute and chronic respiratory failure with hypoxia: Secondary | ICD-10-CM | POA: Diagnosis not present

## 2024-04-14 LAB — GLUCOSE, CAPILLARY: Glucose-Capillary: 118 mg/dL — ABNORMAL HIGH (ref 70–99)

## 2024-04-14 LAB — CBC
HCT: 37.5 % — ABNORMAL LOW (ref 39.0–52.0)
Hemoglobin: 11.7 g/dL — ABNORMAL LOW (ref 13.0–17.0)
MCH: 27.9 pg (ref 26.0–34.0)
MCHC: 31.2 g/dL (ref 30.0–36.0)
MCV: 89.3 fL (ref 80.0–100.0)
Platelets: 104 K/uL — ABNORMAL LOW (ref 150–400)
RBC: 4.2 MIL/uL — ABNORMAL LOW (ref 4.22–5.81)
RDW: 15 % (ref 11.5–15.5)
WBC: 9.1 K/uL (ref 4.0–10.5)
nRBC: 0 % (ref 0.0–0.2)

## 2024-04-14 LAB — COMPREHENSIVE METABOLIC PANEL WITH GFR
ALT: 191 U/L — ABNORMAL HIGH (ref 0–44)
AST: 171 U/L — ABNORMAL HIGH (ref 15–41)
Albumin: 3.4 g/dL — ABNORMAL LOW (ref 3.5–5.0)
Alkaline Phosphatase: 164 U/L — ABNORMAL HIGH (ref 38–126)
Anion gap: 10 (ref 5–15)
BUN: 39 mg/dL — ABNORMAL HIGH (ref 8–23)
CO2: 25 mmol/L (ref 22–32)
Calcium: 8.8 mg/dL — ABNORMAL LOW (ref 8.9–10.3)
Chloride: 105 mmol/L (ref 98–111)
Creatinine, Ser: 1.27 mg/dL — ABNORMAL HIGH (ref 0.61–1.24)
GFR, Estimated: 59 mL/min — ABNORMAL LOW
Glucose, Bld: 114 mg/dL — ABNORMAL HIGH (ref 70–99)
Potassium: 4.4 mmol/L (ref 3.5–5.1)
Sodium: 139 mmol/L (ref 135–145)
Total Bilirubin: 0.3 mg/dL (ref 0.0–1.2)
Total Protein: 6.4 g/dL — ABNORMAL LOW (ref 6.5–8.1)

## 2024-04-14 MED ORDER — AMOXICILLIN-POT CLAVULANATE 875-125 MG PO TABS
1.0000 | ORAL_TABLET | Freq: Two times a day (BID) | ORAL | 0 refills | Status: AC
  Filled 2024-04-14: qty 10, 5d supply, fill #0

## 2024-04-14 MED ORDER — ACETAMINOPHEN 500 MG PO TABS: 500.0000 mg | ORAL_TABLET | Freq: Four times a day (QID) | ORAL | Status: DC | PRN

## 2024-04-14 NOTE — TOC Transition Note (Cosign Needed)
 Transition of Care [facility]) - Discharge Note   Patient Details  Name: Paul [name] MRN: [id number] Date of Birth: 12-11-1950  Transition of Care [facility]) CM/SW Contact:  Rosaline JONELLE Joe, RN Phone Number: 04/14/2024, 11:33 AM   Clinical Narrative:    Transition of Care [facility]) - Inpatient Brief Assessment   Patient Details  Name: Paul [name] MRN: [id number] Date of Birth: 04-14-1951  Transition of Care [facility]) CM/SW Contact:    Rosaline JONELLE Joe, RN Phone Number: 04/14/2024, 11:33 AM   Clinical Narrative: CM met with the patient at the bedside.  Patient lives at home with a friend and plans to return home today once home oxygen  and home health have been coordinate.  The patient was offered Medicare choice regarding home health and patient did not have a preference.  I called [employer] and cory, RNCM accepted for services.  HH orders for RN, PT and aide placed - to be co-signed by MD.  DME at the home includes old home oxygen  concentrator - not used in years and patient unaware of company, [place], [employer] and WC.  I called [employer] and requested home oxygen  - Jermaine will check relating to oxygen  company.  Patient's brother will provide transportation to home by car when oxygen  has been delivered to the bedside.  MD asked to placed home oxygen  order and co-sign hh orders.   Transition of Care Asessment: Insurance and Status: Insurance coverage has been reviewed Patient has primary care physician: Yes Home environment has been reviewed: from home Prior level of function:: Independent Prior/Current Home Services: No current home services Social Drivers of Health Review:  (Not assessed yet) Readmission risk has been reviewed: Yes Transition of care needs: transition of care needs identified, TOC will continue to follow      Barriers to Discharge: Continued Medical Work up   Patient Goals and CMS Choice Patient states their goals for this hospitalization and ongoing recovery  are:: to return home          Discharge Placement                       Discharge Plan and Services Additional resources added to the After Visit Summary for     Discharge Planning Services: CM Consult                                 Social Drivers of Health (SDOH) Interventions SDOH Screenings   Food Insecurity: No Food Insecurity (04/13/2024)  Housing: Low Risk (04/13/2024)  Transportation Needs: No Transportation Needs (04/13/2024)  Utilities: Not At Risk (04/13/2024)  Alcohol  Screen: Low Risk (11/21/2022)  Depression (PHQ2-9): Low Risk (02/08/2024)  Financial Resource Strain: Low Risk (11/21/2022)  Physical Activity: Inactive (11/21/2022)  Social Connections: Socially Isolated (04/13/2024)  Stress: No Stress Concern Present (11/21/2022)  Tobacco Use: Medium Risk (04/10/2024)  Health Literacy: Adequate Health Literacy (11/21/2022)     Readmission Risk Interventions     No data to display

## 2024-04-14 NOTE — Plan of Care (Signed)

## 2024-04-14 NOTE — Discharge Summary (Signed)
 " Physician Discharge Summary   Patient: Paul [name] MRN: [id number] DOB: March 27, 1951  Admit date:     04/10/2024  Discharge date: 04/14/2024  Discharge Physician: Lonni SHAUNNA Dalton   PCP: Oris Camie BRAVO, NP     Recommendations at discharge:  Follow up with PCP in 1 week for pneumonia Check BMP and CBC in 1 week (discharge Cr 1.2, K 4.4, Hgb 11.7, platelets 104K) Check LFTs in 1 week and resume statin if appropriate Wean O2 as able     Discharge Diagnoses: Principal Problem:   Sepsis due to pneumonia Other hospital problems   Acute respiratory failure with hypoxia   Acute renal failure   COPD exacerbation   Cirrhosis   History of alcohol  use   Chronic diastolic congestive heart failure   Coronary artery disease   Hypertension   Diabetes controlled with hyperglycemia   Hyponatremia   Sleep apnea   Class I obesity   Thrombocytopenia   Transaminitis     Hospital Course: 73 y.o. M with COPD on home O2 intermittently, liver cirrhosis still drinking, CAD s/p CABG 2022, OSA not on CPAP, obesity, dCHF, HTN, and DM who presented with SOB, respiratory distress and fever. Found to have RLL pneumonia, admitted to ICU on BiPAP.        Sepsis due to pneumonia due to Streptococcus pneumoniae Presented with fever, tachycardia, respiratory failure, encephalopathy and AKI.  CXR with RLL lobar pneumonia.  COVID, flu, RSV, 20-plex negative.  Urine strep antigen +.    Treated with Rocephin  and azithromycin .  Discharged to complete total 7 days with Augmentin .  Recommended aggressive pulmonary toilet.  Patient now mentating at baseline, taking orals.  Temp < 100 F, heart rate < 100bpm, RR < 24.   Stable for discharge.        Acute on chronic respiratory failure due to pneumonia Presented with respiratory distress, SpO2 in the 80s on room air.  Required BiPAP on admission, subsequently weaned to 2-3L.     Acute metabolic encephalopathy Due to sepsis, resolved by the time of  discharge.    COPD exacerbation On Breztri  as an outpatient, treated initially in the ICU with steroids, but no wheezing, and hyperglycemia problematic so steroids stopped.      Acute renal failure  Admitted with Creatinine 2.34, doubled from baseline 1.1.  Treated with fluids and resolved by the time of discharge.     Cirrhosis Transaminitis No ascites, encephalopathy or bleeding.   LFTs noted to be up prior to discharge. No RUQ pain, no jaundice, Tbili normal.  Suspect some septic insult superimposed on cirrhosis. - Hold statin and repeat LFTs as outpatient   History of alcohol  use No evidence of withdrawal in the hospital  Chronic diastolic CHF Coronary artery disease Hypertension Remained euvolemic in the hospital, blood pressure controlled.   Diabetes controlled with hyperglycemia Hemoglobin A1c at goal for age at 7.4% last Oct.  Hyperglycemic with steroids, but blood sugars resolved by the time of discharge.   Hyponatremia Stable   OSA Obesity class 1 Does not use CPAP at home    Thrombocytopenia Due to sepsis.  Stable at the time of discharge.  Recommend outpatient CBC.               The [place]  Controlled Substances Registry was reviewed for this patient prior to discharge.  Consultants: Critical care   Disposition: Home health Diet recommendation:  Carb modified diet  DISCHARGE MEDICATION: Allergies as of 04/14/2024  Reactions   Morphine  And Codeine Itching   Possible itching due to morphine  01/26/21   Ozempic (0.25 Or 0.5 Mg-dose) [semaglutide(0.25 Or 0.5mg -dos)] Nausea And Vomiting   Other Other (See Comments)   Cats- eyes burn        Medication List     PAUSE taking these medications    rosuvastatin  20 MG tablet Wait to take this until your doctor or other care provider tells you to start again. Commonly known as: CRESTOR  TAKE 1 TABLET DAILY       STOP taking these medications    acetaminophen  500 MG  tablet Commonly known as: TYLENOL        TAKE these medications    albuterol  108 (90 Base) MCG/ACT inhaler Commonly known as: VENTOLIN  HFA Inhale 2 puffs into the lungs every 4 (four) hours as needed. For breathing.   amoxicillin -clavulanate 875-125 MG tablet Commonly known as: AUGMENTIN  Take 1 tablet by mouth 2 (two) times daily.   Aspirin  Low Dose 81 MG tablet Generic drug: aspirin  EC TAKE 1 TABLET DAILY   BD Pen Needle Nano 2nd Gen 32G X 4 MM Misc Generic drug: Insulin  Pen Needle USE DAILY WITH INSULIN  AS DIRECTED   Breztri  Aerosphere 160-9-4.8 MCG/ACT Aero inhaler Generic drug: budesonide -glycopyrrolate -formoterol  Inhale 2 puffs into the lungs in the morning and at bedtime.   dextromethorphan -guaiFENesin  30-600 MG 12hr tablet Commonly known as: MUCINEX  DM Take 2 tablets in the morning as needed for cough.   FreeStyle Libre 3 Plus Sensor Misc Change sensor every 15 days.   FreeStyle Libre 3 Reader [employer] Use with freestyle libre 3 sensor   gabapentin  300 MG capsule Commonly known as: NEURONTIN  TAKE 2 CAPSULES THREE TIMES A DAY FOR BACK AND FOOT PAIN   HYDROcodone -acetaminophen  7.5-325 MG tablet Commonly known as: NORCO Take 1 tablet by mouth every 6 (six) hours as needed for pain.   HYDROcodone -acetaminophen  7.5-325 MG tablet Commonly known as: NORCO Take 1 tablet by mouth every 6 (six) hours as needed for pain. Start taking on: April 23, 2024   HYDROcodone -acetaminophen  7.5-325 MG tablet Commonly known as: NORCO Take 1 tablet by mouth every 6 (six) hours as needed for pain. Start taking on: May 23, 2024   Insulin  Aspart FlexPen 100 UNIT/ML Commonly known as: NOVOLOG  USE AS INSTRUCTED BY YOUR PRESCRIBER   Jardiance  10 MG Tabs tablet Generic drug: empagliflozin  Take 10 mg by mouth daily.   metFORMIN  500 MG 24 hr tablet Commonly known as: GLUCOPHAGE -XR TAKE 2 TABLETS TWICE A DAY   metoprolol  tartrate 25 MG tablet Commonly known as:  LOPRESSOR  Take 25 mg by mouth 2 (two) times daily.   ondansetron  4 MG tablet Commonly known as: Zofran  Take 1 tablet (4 mg total) by mouth every 8 (eight) hours as needed for nausea or vomiting.   OneTouch Verio test strip Generic drug: glucose blood Use to check blood glucose 3 times daily   pantoprazole  40 MG tablet Commonly known as: PROTONIX  TAKE 1 TABLET DAILY (REPLACES OMEPRAZOLE )   Testosterone  1.62 % Gel Apply 6 Pumps topically once daily.   Toujeo  SoloStar 300 UNIT/ML Solostar Pen Generic drug: insulin  glargine (1 Unit Dial ) Inject 12 Units into the skin daily at 6 (six) AM.   valsartan  80 MG tablet Commonly known as: Diovan  Take 1 tablet (80 mg total) by mouth daily.   zolpidem  10 MG tablet Commonly known as: AMBIEN  Take 1 tablet (10 mg total) by mouth at bedtime as needed.  Durable Medical Equipment  (From admission, onward)           Start     Ordered   04/11/24 1235  For home use only DME Shower stool  Once        04/11/24 1234            Contact information for follow-up providers     Early, Camie BRAVO, NP. Schedule an appointment as soon as possible for a visit in 1 week(s).   Specialty: Nurse Practitioner Contact information: [address] [place] [address] [phone]              Contact information for after-discharge care     Home Medical Care     [facility]) .   Service: Home Health Services Contact information: [address] [place] [address] [place]   [address] [phone]                     Discharge Instructions     Discharge instructions   Complete by: As directed    **IMPORTANT DISCHARGE INSTRUCTIONS**   From Dr. Jonel: YOu were admitted for pneumonia  Here, you were treated with antibiotics and improved  You should continue antibiotics for 5 more days then stop  IMPORTANT: use the incentive spirometer and flutter valve  frequently  Go see your primary doctor in 1 week Resume your home medicines EXCEPT rosuvastatin /Crestor  for cholesterol Set this aside and hold this for now  Have him check your liver functions and kidney function and let you know if you can resume it   Increase activity slowly   Complete by: As directed        Discharge Exam: Filed Weights   04/12/24 0710 04/13/24 0500 04/14/24 0500  Weight: 79.8 kg 80.2 kg 75.9 kg  BP 118/64 (BP Location: Right Wrist)   Pulse 80   Temp 98.1 F (36.7 C) (Oral)   Resp 18   Ht 5' 5 (1.651 m)   Wt 75.9 kg   SpO2 96%   BMI 27.85 kg/m    General: Pt is alert, awake, not in acute distress Cardiovascular: RRR, nl S1-S2, no murmurs appreciated.   No LE edema.   Respiratory: Normal respiratory rate and rhythm.  No wheezing bilaterally.  Improving crackles in the right base.  Good air movement. Abdominal: Abdomen soft and non-tender.  No distension or HSM.   Neuro/Psych: Strength symmetric in upper and lower extremities.  Judgment and insight appear normal.   Condition at discharge: fair  The results of significant diagnostics from this hospitalization (including imaging, microbiology, ancillary and laboratory) are listed below for reference.   Imaging Studies: DG Chest Portable 1 View Result Date: 04/10/2024 EXAM: 1 VIEW(S) XRAY OF THE CHEST 04/10/2024 01:43:00 PM COMPARISON: CXR 03/17/2023, CT Chest 03/17/2023. CLINICAL HISTORY: cough FINDINGS: LUNGS AND PLEURA: Low lung volumes. Interval development of the right lower lobe patchy airspace opacities with likely overlying trace pleural effusion. No left pleural effusion. No pneumothorax. HEART AND MEDIASTINUM: Surgical changes overlying mediastinum. Sternotomy wires noted. No acute abnormality of the cardiac and mediastinal silhouettes. BONES AND SOFT TISSUES: Partially seen cervical spinal fusion hardware. Sternotomy wires noted. No acute osseous abnormality. IMPRESSION: 1. Right lower lobe patchy  airspace opacities with likely trace pleural effusion. Follow-up PA and lateral chest x-ray is recommended in 3-4 weeks following therapy to ensure resolution. Electronically signed by: Morgane Naveau MD 04/10/2024 01:57 PM EST RP Workstation: [id number]    Microbiology:  Results for orders placed or performed during the hospital encounter of 04/10/24  Resp panel by RT-PCR (RSV, Flu A&B, Covid) Anterior Nasal Swab     Status: None   Collection Time: 04/10/24  1:05 PM   Specimen: Anterior Nasal Swab  Result Value Ref Range Status   SARS Coronavirus 2 by RT PCR NEGATIVE NEGATIVE Final   Influenza A by PCR NEGATIVE NEGATIVE Final   Influenza B by PCR NEGATIVE NEGATIVE Final    Comment: (NOTE) The Xpert Xpress SARS-CoV-2/FLU/RSV plus assay is intended as an aid in the diagnosis of influenza from Nasopharyngeal swab specimens and should not be used as a sole basis for treatment. Nasal washings and aspirates are unacceptable for Xpert Xpress SARS-CoV-2/FLU/RSV testing.  Fact Sheet for Patients: bloggercourse.com  Fact Sheet for Healthcare Providers: seriousbroker.it  This test is not yet approved or cleared by the [place]  FDA and has been authorized for detection and/or diagnosis of SARS-CoV-2 by FDA under an Emergency Use Authorization (EUA). This EUA will remain in effect (meaning this test can be used) for the duration of the COVID-19 declaration under Section 564(b)(1) of the Act, 21 U.S.C. section 360bbb-3(b)(1), unless the authorization is terminated or revoked.     Resp Syncytial Virus by PCR NEGATIVE NEGATIVE Final    Comment: (NOTE) Fact Sheet for Patients: bloggercourse.com  Fact Sheet for Healthcare Providers: seriousbroker.it  This test is not yet approved or cleared by the [place]  FDA and has been authorized for detection and/or diagnosis of SARS-CoV-2 by FDA  under an Emergency Use Authorization (EUA). This EUA will remain in effect (meaning this test can be used) for the duration of the COVID-19 declaration under Section 564(b)(1) of the Act, 21 U.S.C. section 360bbb-3(b)(1), unless the authorization is terminated or revoked.  Performed at [facility], [address] [place], [place] [address]   Blood culture (routine x 2)     Status: None (Preliminary result)   Collection Time: 04/10/24  2:43 PM   Specimen: BLOOD RIGHT ARM  Result Value Ref Range Status   Specimen Description BLOOD RIGHT ARM  Final   Special Requests   Final    BOTTLES DRAWN AEROBIC ONLY Blood Culture results may not be optimal due to an inadequate volume of blood received in culture bottles   Culture   Final    NO GROWTH 4 DAYS Performed at [facility], [address] [place], [place] [address]    Report Status PENDING  Incomplete  Blood culture (routine x 2)     Status: None (Preliminary result)   Collection Time: 04/10/24  2:50 PM   Specimen: BLOOD  Result Value Ref Range Status   Specimen Description BLOOD LEFT ANTECUBITAL  Final   Special Requests   Final    BOTTLES DRAWN AEROBIC AND ANAEROBIC Blood Culture adequate volume   Culture   Final    NO GROWTH 4 DAYS Performed at [facility], [address] [place], [place] [address]    Report Status PENDING  Incomplete  MRSA Next Gen by PCR, Nasal     Status: None   Collection Time: 04/10/24  4:45 PM   Specimen: Nasal Mucosa; Nasal Swab  Result Value Ref Range Status   MRSA by PCR Next Gen NOT DETECTED NOT DETECTED Final    Comment: (NOTE) The GeneXpert MRSA Assay (FDA approved for NASAL specimens only), is one component of a comprehensive MRSA colonization surveillance program. It is not intended to diagnose  MRSA infection nor to guide or monitor treatment for MRSA infections. Test performance is not FDA approved in patients less than 22 years old. Performed at [facility],  [address] [place], [place] [address]   Respiratory (~20 pathogens) panel by PCR     Status: None   Collection Time: 04/11/24 10:36 AM   Specimen: Nasopharyngeal Swab; Respiratory  Result Value Ref Range Status   Adenovirus NOT DETECTED NOT DETECTED Final   Coronavirus 229E NOT DETECTED NOT DETECTED Final    Comment: (NOTE) The Coronavirus on the Respiratory Panel, DOES NOT test for the novel  Coronavirus (2019 nCoV)    Coronavirus HKU1 NOT DETECTED NOT DETECTED Final   Coronavirus NL63 NOT DETECTED NOT DETECTED Final   Coronavirus OC43 NOT DETECTED NOT DETECTED Final   Metapneumovirus NOT DETECTED NOT DETECTED Final   Rhinovirus / Enterovirus NOT DETECTED NOT DETECTED Final   Influenza A NOT DETECTED NOT DETECTED Final   Influenza B NOT DETECTED NOT DETECTED Final   Parainfluenza Virus 1 NOT DETECTED NOT DETECTED Final   Parainfluenza Virus 2 NOT DETECTED NOT DETECTED Final   Parainfluenza Virus 3 NOT DETECTED NOT DETECTED Final   Parainfluenza Virus 4 NOT DETECTED NOT DETECTED Final   Respiratory Syncytial Virus NOT DETECTED NOT DETECTED Final   Bordetella pertussis NOT DETECTED NOT DETECTED Final   Bordetella Parapertussis NOT DETECTED NOT DETECTED Final   Chlamydophila pneumoniae NOT DETECTED NOT DETECTED Final   Mycoplasma pneumoniae NOT DETECTED NOT DETECTED Final    Comment: Performed at [facility], [address] [place], [place] [address]    Labs: CBC: Recent Labs  Lab 04/10/24 1335 04/10/24 1345 04/11/24 0259 04/12/24 0413 04/13/24 0305 04/14/24 0554  WBC 20.7*  --  24.1* 12.1* 10.9* 9.1  NEUTROABS 17.0*  --   --   --   --   --   HGB 14.5 15.3 13.0 11.3* 11.0* 11.7*  HCT 44.7 45.0 41.1 34.4* 33.9* 37.5*  MCV 87.0  --  88.8 86.6 87.6 89.3  PLT 153  --  125* 103* 101* 104*   Basic Metabolic Panel: Recent Labs  Lab 04/10/24 1335 04/10/24 1345 04/11/24 0259 04/12/24 0413 04/13/24 0305 04/14/24 0554  NA 134* 136 134* 133* 132* 139  K 4.8 4.8  4.2 4.5 4.3 4.4  CL 99  --  100 102 101 105  CO2 17*  --  16* 20* 21* 25  GLUCOSE 196*  --  397* 251* 291* 114*  BUN 35*  --  40* 54* 51* 39*  CREATININE 2.34*  --  2.17* 1.45* 1.25* 1.27*  CALCIUM  8.9  --  8.6* 8.4* 8.5* 8.8*  MG 1.4*  --  2.3  --   --   --   PHOS  --   --  4.1  --   --   --    Liver Function Tests: Recent Labs  Lab 04/10/24 1335 04/11/24 0259 04/14/24 0554  AST 28 26 171*  ALT 21 19 191*  ALKPHOS 82 65 164*  BILITOT 1.0 0.5 0.3  PROT 7.1 6.5 6.4*  ALBUMIN  4.1 3.6 3.4*   CBG: Recent Labs  Lab 04/13/24 0736 04/13/24 1246 04/13/24 1633 04/13/24 1953 04/14/24 0813  GLUCAP 241* 184* 219* 188* 118*    Discharge time spent: approximately 45 minutes spent on discharge counseling, evaluation of patient on day of discharge, and coordination of discharge planning with nursing, social work, pharmacy and case management  Signed: Lonni SHAUNNA Dalton,  MD [facility] 04/14/2024         "

## 2024-04-15 ENCOUNTER — Telehealth: Payer: Self-pay | Admitting: *Deleted

## 2024-04-15 ENCOUNTER — Emergency Department (HOSPITAL_COMMUNITY)
Admission: EM | Admit: 2024-04-15 | Discharge: 2024-04-15 | Disposition: A | Discharge status: Home / Self Care | Attending: Emergency Medicine | Admitting: Emergency Medicine

## 2024-04-15 ENCOUNTER — Telehealth: Payer: Self-pay | Admitting: Internal Medicine

## 2024-04-15 ENCOUNTER — Other Ambulatory Visit: Payer: Self-pay

## 2024-04-15 ENCOUNTER — Ambulatory Visit: Payer: Self-pay

## 2024-04-15 ENCOUNTER — Emergency Department (HOSPITAL_COMMUNITY)

## 2024-04-15 ENCOUNTER — Encounter (HOSPITAL_COMMUNITY): Payer: Self-pay

## 2024-04-15 DIAGNOSIS — D72829 Elevated white blood cell count, unspecified: Secondary | ICD-10-CM | POA: Insufficient documentation

## 2024-04-15 DIAGNOSIS — R110 Nausea: Secondary | ICD-10-CM

## 2024-04-15 DIAGNOSIS — J189 Pneumonia, unspecified organism: Secondary | ICD-10-CM

## 2024-04-15 DIAGNOSIS — R0602 Shortness of breath: Secondary | ICD-10-CM | POA: Diagnosis present

## 2024-04-15 DIAGNOSIS — Z794 Long term (current) use of insulin: Secondary | ICD-10-CM | POA: Diagnosis not present

## 2024-04-15 DIAGNOSIS — R195 Other fecal abnormalities: Secondary | ICD-10-CM

## 2024-04-15 DIAGNOSIS — J449 Chronic obstructive pulmonary disease, unspecified: Secondary | ICD-10-CM | POA: Insufficient documentation

## 2024-04-15 DIAGNOSIS — E118 Type 2 diabetes mellitus with unspecified complications: Secondary | ICD-10-CM

## 2024-04-15 LAB — CBC WITH DIFFERENTIAL/PLATELET
Abs Immature Granulocytes: 0.75 K/uL — ABNORMAL HIGH (ref 0.00–0.07)
Basophils Absolute: 0.1 K/uL (ref 0.0–0.1)
Basophils Relative: 1 %
Eosinophils Absolute: 0.3 K/uL (ref 0.0–0.5)
Eosinophils Relative: 2 %
HCT: 38.3 % — ABNORMAL LOW (ref 39.0–52.0)
Hemoglobin: 11.9 g/dL — ABNORMAL LOW (ref 13.0–17.0)
Immature Granulocytes: 6 %
Lymphocytes Relative: 10 %
Lymphs Abs: 1.3 K/uL (ref 0.7–4.0)
MCH: 28.7 pg (ref 26.0–34.0)
MCHC: 31.1 g/dL (ref 30.0–36.0)
MCV: 92.3 fL (ref 80.0–100.0)
Monocytes Absolute: 0.8 K/uL (ref 0.1–1.0)
Monocytes Relative: 6 %
Neutro Abs: 9.6 K/uL — ABNORMAL HIGH (ref 1.7–7.7)
Neutrophils Relative %: 75 %
Platelets: 136 K/uL — ABNORMAL LOW (ref 150–400)
RBC: 4.15 MIL/uL — ABNORMAL LOW (ref 4.22–5.81)
RDW: 15.1 % (ref 11.5–15.5)
Smear Review: NORMAL
WBC: 12.8 K/uL — ABNORMAL HIGH (ref 4.0–10.5)
nRBC: 0.2 % (ref 0.0–0.2)

## 2024-04-15 LAB — CULTURE, BLOOD (ROUTINE X 2)
Culture: NO GROWTH
Culture: NO GROWTH
Special Requests: ADEQUATE

## 2024-04-15 LAB — BASIC METABOLIC PANEL WITH GFR
Anion gap: 11 (ref 5–15)
BUN: 31 mg/dL — ABNORMAL HIGH (ref 8–23)
CO2: 22 mmol/L (ref 22–32)
Calcium: 8.8 mg/dL — ABNORMAL LOW (ref 8.9–10.3)
Chloride: 105 mmol/L (ref 98–111)
Creatinine, Ser: 1.05 mg/dL (ref 0.61–1.24)
GFR, Estimated: >60 mL/min
Glucose, Bld: 211 mg/dL — ABNORMAL HIGH (ref 70–99)
Potassium: 4.8 mmol/L (ref 3.5–5.1)
Sodium: 138 mmol/L (ref 135–145)

## 2024-04-15 MED ORDER — SODIUM CHLORIDE 0.9 % IV BOLUS
500.0000 mL | Freq: Once | INTRAVENOUS | Status: AC
  Administered 2024-04-15: 500 mL via INTRAVENOUS

## 2024-04-15 MED ORDER — IPRATROPIUM-ALBUTEROL 0.5-2.5 (3) MG/3ML IN SOLN
3.0000 mL | Freq: Once | RESPIRATORY_TRACT | Status: AC
  Administered 2024-04-15: 3 mL via RESPIRATORY_TRACT
  Filled 2024-04-15: qty 3

## 2024-04-15 MED ORDER — ALBUTEROL SULFATE HFA 108 (90 BASE) MCG/ACT IN AERS
4.0000 | INHALATION_SPRAY | Freq: Once | RESPIRATORY_TRACT | Status: AC
  Administered 2024-04-15: 4 via RESPIRATORY_TRACT
  Filled 2024-04-15: qty 6.7

## 2024-04-15 NOTE — ED Notes (Signed)
 Pt. Given turkey sandwich and crackers

## 2024-04-15 NOTE — TOC CM/SW Note (Addendum)
 TOC consult received for home O2. Per notes, patient was supposed to d/c with new home oxygen  from [facility] on 12/29. O2 delivery was not confirmed prior to discharge.  CM contacted [employer] at [phone]. O2 order was not received. Message sent to DO requesting a new 6 min walk, as prior testing completed on 12/28 has expired, and new home O2 order. Home O2 order received and [employer] will deliver patient oxygen  this evening.   Merilee Batty, MSN, RN Case Management [phone]   1910: Per RN, O2 has not yet been delivered to bedside. Contacted [employer] at [phone] for update. Awaiting call back from delivery driver with ETA. RN updated.    1920: Received call from delivery driver. He just completed delivery of all equipment at patient home. Patient's family member to bring portable for transport home. RN updated.

## 2024-04-15 NOTE — Telephone Encounter (Signed)
 Called patient to confirm  AWV for today  Home health nurse was at home with patient . Home Health aid took  vitals patient,  O2 was 89 and his BP was 80/42 taken twice by home health aid.   Temp 98.0      Patient stated he was very weak.  Patient was discharged from hospital on 04/14/2024 pneumonia.     Patient is not on  home Oxygen .   Home Health aid was calling Ems due to low BP and Oxygen  Levels.

## 2024-04-15 NOTE — ED Notes (Signed)
 Pt set up with home portable oxygen  and educated prior to dc, pt wheelchaired to lobby to return home with family at dc.

## 2024-04-15 NOTE — ED Provider Triage Note (Addendum)
 Emergency Medicine Provider Triage Evaluation Note  Paul [name] , a 73 y.o. male  was evaluated in triage.  Pt complains of shortness of breath, just discharged from the hospital yesterday supposedly with supposed to go home with home oxygen .  Home health nurse found him to have oxygen  in the upper 80s.  He did not sleep well last night.  He feels better now that he has oxygen  on..  Review of Systems  Positive: sob Negative: Fever  Physical Exam  BP 98/60   Pulse 71   Temp 98.1 F (36.7 C) (Oral)   Resp 18   Ht 5' 5 (1.651 m)   Wt 75.9 kg   SpO2 96%   BMI 27.85 kg/m  Gen:   Awake, no distress   Resp:  Normal effort  MSK:   Moves extremities without difficulty  Other:    Medical Decision Making  Medically screening exam initiated at 12:23 PM.  Appropriate orders placed.  Paul [name] was informed that the remainder of the evaluation will be completed by another provider, this initial triage assessment does not replace that evaluation, and the importance of remaining in the ED until their evaluation is complete.  Sounds like he was weaned to 2 to 3 L of oxygen  but cannot really tell from discharge note if he is supposed to be on oxygen  at home or not.   Paul Cornet, DO 04/15/24 1223    Paul Cornet, DO 04/15/24 1226

## 2024-04-15 NOTE — Telephone Encounter (Signed)
 Copied from CRM #[id number]. Topic: General - Other >> Apr 15, 2024 11:04 AM Ahlexyia S wrote: Reason for CRM: Morna from [facility] called in to inform clinic that she is sending pt back to [facility]. Pt was previously admitted to the hospital for pneumonia and discharged yesterday. Pt oxygen  was 88, blood pressure was 88/42 and pt stated he felt really weak. Morna also mentioned that pt was missing medication empagliflozin  (JARDIANCE ) 10 MG TABS tablet, ondansetron  (ZOFRAN ) 4 MG tablet and dextromethorphan -guaiFENesin  (MUCINEX  DM) 30-600 MG 12hr tablet. Morna stated that pt is no longer taking Testosterone  20.25 MG/ACT (1.62%) GEL and is only taking 10 units of insulin  glargine, 1 Unit Dial , (TOUJEO  SOLOSTAR) 300 UNIT/ML Solostar Pen but his prescription instructions states to take 12 units. Morna is requesting a callback.

## 2024-04-15 NOTE — ED Notes (Addendum)
 SATURATION QUALIFICATIONS: (This note is used to comply with regulatory documentation for home oxygen )  Patient Saturations on Room Air at Rest = 88%  Patient Saturations on 3 Liters of oxygen  while Ambulating = 86%  Please briefly explain why patient needs home oxygen : Pt failed alternative measures and requires supplemental oxygen  to maintain SpO2 >88%.

## 2024-04-15 NOTE — ED Provider Notes (Signed)
 " [name] EMERGENCY DEPARTMENT AT [facility] Provider Note   CSN: [id number] Arrival date & time: 04/15/24  1128     Patient presents with: Shortness of Breath   Paul [name] is a 73 y.o. male.  With a history of COPD and new oxygen  dependence who presents to the ED for shortness of breath.  Recently seen in the ED for pneumonia on Christmas and subsequently admitted.  Was discharged yesterday afternoon with plan to start home oxygen  however oxygen  was not delivered.  Seen by home health nurse today who noted to be hypoxic without supplemental oxygen  in the upper 80s reporting shortness of breath.  EMS was called placed patient on baseline 4 L nasal cannula with good improvement.  Feels well now and no chest pain fevers chills nausea vomiting    Shortness of Breath      Prior to Admission medications  Medication Sig Start Date End Date Taking? Authorizing Provider  albuterol  (VENTOLIN  HFA) 108 (90 Base) MCG/ACT inhaler Inhale 2 puffs into the lungs every 4 (four) hours as needed. For breathing. 03/31/24   Early, Sara E, NP  amoxicillin -clavulanate (AUGMENTIN ) 875-125 MG tablet Take 1 tablet by mouth 2 (two) times daily. 04/14/24   Jonel Lonni SQUIBB, MD  ASPIRIN  LOW DOSE 81 MG tablet TAKE 1 TABLET DAILY 03/17/24   Early, Camie BRAVO, NP  BD PEN NEEDLE NANO 2ND GEN 32G X 4 MM MISC USE DAILY WITH INSULIN  AS DIRECTED 06/19/23   Early, Sara E, NP  budesonide -glycopyrrolate -formoterol  (BREZTRI  AEROSPHERE) 160-9-4.8 MCG/ACT AERO inhaler Inhale 2 puffs into the lungs in the morning and at bedtime. 12/07/23   Early, Sara E, NP  Continuous Glucose Receiver (FREESTYLE LIBRE 3 READER) DEVI Use with freestyle libre 3 sensor 12/08/22   Tysinger, Alm RAMAN, PA-C  Continuous Glucose Sensor (FREESTYLE LIBRE 3 PLUS SENSOR) MISC Change sensor every 15 days. 12/28/23   Early, Sara E, NP  dextromethorphan -guaiFENesin  (MUCINEX  DM) 30-600 MG 12hr tablet Take 2 tablets in the morning as needed for cough.  09/28/23   Early, Sara E, NP  empagliflozin  (JARDIANCE ) 10 MG TABS tablet Take 10 mg by mouth daily.    [provider]  gabapentin  (NEURONTIN ) 300 MG capsule TAKE 2 CAPSULES THREE TIMES A DAY FOR BACK AND FOOT PAIN 03/17/24   Early, Sara E, NP  glucose blood (ONETOUCH VERIO) test strip Use to check blood glucose 3 times daily 12/27/20     HYDROcodone -acetaminophen  (NORCO) 7.5-325 MG tablet Take 1 tablet by mouth every 6 (six) hours as needed for pain. 03/24/24     HYDROcodone -acetaminophen  (NORCO) 7.5-325 MG tablet Take 1 tablet by mouth every 6 (six) hours as needed for pain. 04/23/24     HYDROcodone -acetaminophen  (NORCO) 7.5-325 MG tablet Take 1 tablet by mouth every 6 (six) hours as needed for pain. 05/23/24     Insulin  Aspart FlexPen (NOVOLOG ) 100 UNIT/ML USE AS INSTRUCTED BY YOUR PRESCRIBER 03/17/24   Early, Sara E, NP  insulin  glargine, 1 Unit Dial , (TOUJEO  SOLOSTAR) 300 UNIT/ML Solostar Pen Inject 12 Units into the skin daily at 6 (six) AM. 11/06/23   Early, Camie BRAVO, NP  metFORMIN  (GLUCOPHAGE -XR) 500 MG 24 hr tablet TAKE 2 TABLETS TWICE A DAY 03/17/24   Early, Sara E, NP  metoprolol  tartrate (LOPRESSOR ) 25 MG tablet Take 25 mg by mouth 2 (two) times daily. 01/05/24   [provider]  ondansetron  (ZOFRAN ) 4 MG tablet Take 1 tablet (4 mg total) by mouth every 8 (eight) hours as  needed for nausea or vomiting. 05/17/23   Early, Sara E, NP  pantoprazole  (PROTONIX ) 40 MG tablet TAKE 1 TABLET DAILY (REPLACES OMEPRAZOLE ) 11/15/23   Early, Sara E, NP  [Paused] rosuvastatin  (CRESTOR ) 20 MG tablet TAKE 1 TABLET DAILY Wait to take this until your doctor or other care provider tells you to start again. 11/15/23   [name], [name], MD  Testosterone  20.25 MG/ACT (1.62%) GEL Apply 6 Pumps topically once daily. 02/18/24   Early, Sara E, NP  valsartan  (DIOVAN ) 80 MG tablet Take 1 tablet (80 mg total) by mouth daily. 11/15/23   [name], [name], MD  zolpidem  (AMBIEN ) 10 MG tablet Take 1 tablet (10 mg total) by  mouth at bedtime as needed. 02/25/24   Early, Sara E, NP    Allergies: Morphine  and codeine, Ozempic (0.25 or 0.5 mg-dose) [semaglutide(0.25 or 0.5mg -dos)], and Other    Review of Systems  Respiratory:  Positive for shortness of breath.     Updated Vital Signs BP 109/84 (BP Location: Right Arm)   Pulse 83   Temp 98.3 F (36.8 C) (Oral)   Resp 20   Ht 5' 5 (1.651 m)   Wt 75.9 kg   SpO2 95%   BMI 27.85 kg/m   Physical Exam Vitals and nursing note reviewed.  HENT:     Head: Normocephalic and atraumatic.  Eyes:     Pupils: Pupils are equal, round, and reactive to light.  Cardiovascular:     Rate and Rhythm: Normal rate and regular rhythm.  Pulmonary:     Effort: Pulmonary effort is normal.     Breath sounds: Normal breath sounds.  Abdominal:     Palpations: Abdomen is soft.     Tenderness: There is no abdominal tenderness.  Skin:    General: Skin is warm and dry.  Neurological:     Mental Status: He is alert.  Psychiatric:        Mood and Affect: Mood normal.     (all labs ordered are listed, but only abnormal results are displayed) Labs Reviewed  CBC WITH DIFFERENTIAL/PLATELET - Abnormal; Notable for the following components:      Result Value   WBC 12.8 (*)    RBC 4.15 (*)    Hemoglobin 11.9 (*)    HCT 38.3 (*)    Platelets 136 (*)    Neutro Abs 9.6 (*)    Abs Immature Granulocytes 0.75 (*)    All other components within normal limits  BASIC METABOLIC PANEL WITH GFR - Abnormal; Notable for the following components:   Glucose, Bld 211 (*)    BUN 31 (*)    Calcium  8.8 (*)    All other components within normal limits    EKG: None  Radiology: DG Chest 2 View Result Date: 04/15/2024 CLINICAL DATA:  Shortness of breath EXAM: CHEST - 2 VIEW COMPARISON:  April 10, 2024.  March 17, 2023. FINDINGS: Status post coronary bypass graft. Left lung is clear. Stable minimal right lower lobe airspace opacity concerning for scarring. No acute abnormality is  noted. IMPRESSION: Stable right lower lobe opacity as described above. Electronically Signed   By: Lynwood Landy Raddle M.D.   On: 04/15/2024 13:53     Procedures   Medications Ordered in the ED  albuterol  (VENTOLIN  HFA) 108 (90 Base) MCG/ACT inhaler 4 puff (4 puffs Inhalation Given 04/15/24 1233)  sodium chloride  0.9 % bolus 500 mL (0 mLs Intravenous Stopped 04/15/24 1744)  ipratropium-albuterol  (DUONEB) 0.5-2.5 (3) MG/3ML nebulizer solution  3 mL (3 mLs Nebulization Given 04/15/24 1548)    Clinical Course as of 04/15/24 2043  Tue Apr 15, 2024  2042 Patient has remained stable on 4 L supplemental oxygen .  Our TOC RN Merilee Batty was able to have oxygen  delivered to his home and go through teaching with the patient and his wife.  Understandably the patient is still upset he was discharged yesterday without oxygen  in place [MP]    Clinical Course User Index [MP] Pamella Ozell LABOR, DO                                 Medical Decision Making 73 year old male with history as above presenting back to the emergency department following discharge yesterday after being admitted for pneumonia.  New oxygen  dependence but supplemental oxygen  and supplies were not delivered to his home as intended.  He is back with shortness of breath hypoxia but feels better since being placed on 4 L nasal cannula by EMS.  Will need to look into status of oxygen  delivery and TOC team will help with this.  Low suspicion for acute pulmonary process at this time  Risk Prescription drug management.        Final diagnoses:  SOB (shortness of breath)    ED Discharge Orders          Ordered    Home O2 eval (desaturation screen)        04/15/24 1701               Pamella Ozell LABOR, DO 04/15/24 2043  "

## 2024-04-15 NOTE — ED Notes (Incomplete)
 SATURATION QUALIFICATIONS: (This note is used to comply with regulatory documentation for home oxygen )   Patient Saturations on Room Air at Rest =    Patient Saturations on 3 Liters of oxygen  while Ambulating = 89%   Please briefly explain why patient needs home oxygen : Pt failed alternative measures and requires supplemental oxygen  to maintain SpO2 >88%.

## 2024-04-15 NOTE — Discharge Instructions (Signed)
 You were seen in the emerged department for low oxygen  level because you did not have oxygen  at home We were able to arrange for oxygen  to be delivered to your house tonight and have confirmed delivery We have gone through teaching on how to use the oxygen  at home Return to the Emergency Department for shortness of breath or for increased oxygen  requirement Otherwise follow-up with your primary doctor Continue taking all previous prescribed occasions

## 2024-04-15 NOTE — ED Triage Notes (Signed)
 Pt from home, [facility] RN came to house and noted him to be 88% on room air. Pt c.o fatigue and sob. Pt was discharged from the hospital yesterday, he was supposed to get an oxygen  delivery yesterday but never did. Pt is supposed to wear 4L [place].   Pt 99% on 4L [place] with ems. Some exertion with breathing but looks improved  Initial BP 90/58 then 130/80  Pt a.o

## 2024-04-16 ENCOUNTER — Other Ambulatory Visit (HOSPITAL_COMMUNITY): Payer: Self-pay

## 2024-04-16 ENCOUNTER — Telehealth: Payer: Self-pay

## 2024-04-16 MED ORDER — ONDANSETRON HCL 4 MG PO TABS
4.0000 mg | ORAL_TABLET | Freq: Three times a day (TID) | ORAL | 2 refills | Status: AC | PRN
  Filled 2024-04-16: qty 20, 7d supply, fill #0

## 2024-04-16 MED ORDER — FT MUCUS RELIEF DM 30-600 MG PO TB12
2.0000 | ORAL_TABLET | Freq: Two times a day (BID) | ORAL | 0 refills | Status: AC | PRN
  Filled 2024-04-16: qty 20, 5d supply, fill #0

## 2024-04-16 MED ORDER — TOUJEO SOLOSTAR 300 UNIT/ML ~~LOC~~ SOPN: 10.0000 [IU] | PEN_INJECTOR | Freq: Every day | SUBCUTANEOUS | Status: AC

## 2024-04-16 NOTE — Addendum Note (Signed)
 Addended by: Aubra Pappalardo, CAMIE E on: 04/16/2024 02:32 PM   Modules accepted: Orders

## 2024-04-16 NOTE — Telephone Encounter (Signed)
 Most of these concerns will require him to be seen by someone to safely address. In his current state, this may be difficult.   I will send a referral for urgent GI evaluation, but don't know how quickly he will be able to be seen. If dark stools continue or increase in frequency, he should return to the hospital for evaluation of this concern as this could indicate he is passing blood from the upper GI tract.   If he is having symptoms of UTI (burning with urination, urgency, pain) we can consider treatment for UTI, but a urinalysis is best to make sure that we are treating with the proper antibiotic. If he should develop fevers, chills, and/or flank pain or changes in mental status, he should return to the hospital for evaluation of this concern.  Without symptoms, I suspect that he is dehydrated. I recommend increase hydration with WATER . Avoid alcohol , electrolyte drinks, caffeine , and sweetened drinks as these can make his other conditions worse.   It is not possible to determine if the congestion is related to COPD, pneumonia, or worsening heart failure without listening to his lungs, labs and weights, unfortunately.  If he should develop worsening shortness of breath, fevers, or edema he should return to the hospital for evaluation as this could indicate recurrence of infection or worsening heart failure.  I recommend he try mucinex  for congestion and maintain hydration. He should also be using his inhaler as directed to see if this helps with the symptoms.   If home health nursing can bring a scale to get weights, that would be ideal. Weight gain of more than 3 pounds overnight or 5 pounds in a week should be reported immediately.   Salon Paz patch is safe for him to use for management of pain. He may also try 4% lidocaine  patch, applied to the area of pain, following directions on the packaging.   He is seen by pain management for his chronic pain, therefore, Dr. Meade will need to address  his pain management medications to ensure the safest management. He has active prescriptions for Norco every 6 hours and based on prescribing, this should be readily available. His baseline is typically in the 6-8/10 range when he comes into the office.

## 2024-04-16 NOTE — Addendum Note (Signed)
 Addended by: Anam Bobby, CAMIE E on: 04/16/2024 02:19 PM   Modules accepted: Orders

## 2024-04-16 NOTE — Transitions of Care (Post Inpatient/ED Visit) (Signed)
 04/16/2024  Patient ID: Paul [name], male   DOB: 1950/09/04, 73 y.o.   MRN: [id number]  04/16/2024  Name: [name] MRN: [id number] DOB: 07/19/1950  04/16/2024: Patient had to return to ED after discharge on 04/14/2024 due to non delivery or DME oxygen , it appears order was not placed. ED repeated required oxygen  saturation test and [employer] did deliver oxygen  set up to home and transport to ED per ICM /RN note.   Upon outreach today PT was with patient from [facility] for evaluation,  Patient reports he did receive oxygen  set up after return to ED on 04/15/2024 and stated he was disappointed in going home without it at discharge, RN expressed understanding.  Patient requested call back later for full outreach completion.   Bing Edison MSN, RN RN [employer] [facility]  [facility] Office Hours M-F [phone] Direct Dial : [phone] Main Phone [phone]  Fax: [phone] [email or web address]

## 2024-04-16 NOTE — Telephone Encounter (Signed)
 Copied from CRM #[id number]. Topic: Clinical - Home Health Verbal Orders >> Apr 16, 2024  1:09 PM Mercedes MATSU wrote: Caller/Agency: [facility]) Callback Number: [phone] (secure line can leave vm) Service Requested: Physical Therapy Frequency: 1 week 8 Any new concerns about the patient? Yes, patient was diagnosed with pneumonia was hospitalized came back home, went back to the er came back home, he was seen today in his home 04/16/2024 by [facility]). Signe also states that the patient reported dark stool, he reports increased smells of urine and its a darker color, he has significant lung congestion, he has congestive heart failure but does not weigh regularly, chronic pain issues today was a 8/10, 1 medicine that he used that is not on the list is MILBURN PAZ wants to know if this is okay. Patient also is reported intermitting coughing and choking with swallowing.

## 2024-04-16 NOTE — Telephone Encounter (Signed)
 Appropriate to send pt back for re-evaluation. Thank you. I recommend close monitoring of oxygenation, blood pressure, and daily weights.   Hold valsartan  for the day if blood pressure is less than 120/70.   Jardiance  should be held with any concerns of volume depletion. If oral intake of water  is low, recommend holding. We will plan to recheck labs at follow-up and determine if restart is appropriate.   Refills on Mucinex  and Ondansetron  sent to the pharmacy. Patient MUST maintain hydration with these medications.   Toujeo  dosing changed to 10units on medication list. Patient was recently started on Novolog , which can require adjustment to basal insulin  dose.  It is appropriate to titrate the basal insulin  (Toujeo ) up by 2 units if fasting blood sugars remain higher than 150 for 4 days in a row.    Testosterone  removed from medication list.

## 2024-04-18 ENCOUNTER — Other Ambulatory Visit (HOSPITAL_COMMUNITY): Payer: Self-pay

## 2024-04-18 ENCOUNTER — Telehealth: Payer: Self-pay

## 2024-04-18 ENCOUNTER — Ambulatory Visit: Admitting: Family Medicine

## 2024-04-18 ENCOUNTER — Ambulatory Visit: Payer: Self-pay

## 2024-04-18 VITALS — BP 96/58 | HR 70 | Wt 164.6 lb

## 2024-04-18 DIAGNOSIS — K146 Glossodynia: Secondary | ICD-10-CM | POA: Diagnosis not present

## 2024-04-18 DIAGNOSIS — E861 Hypovolemia: Secondary | ICD-10-CM | POA: Diagnosis not present

## 2024-04-18 MED ORDER — NYSTATIN 100000 UNIT/ML MT SUSP
10.0000 mL | Freq: Three times a day (TID) | OROMUCOSAL | 1 refills | Status: AC
  Filled 2024-04-18: qty 180, 6d supply, fill #0

## 2024-04-18 MED ORDER — DIPHENHYDRAMINE HCL 25 MG PO TABS
25.0000 mg | ORAL_TABLET | Freq: Four times a day (QID) | ORAL | 0 refills | Status: AC | PRN
  Filled 2024-04-18: qty 100, 25d supply, fill #0

## 2024-04-18 NOTE — Progress Notes (Signed)
 "  Name: Jaedon Siler   Date of Visit: 04/18/2024   Date of last visit with me: 03/11/2024   CHIEF COMPLAINT:  Chief Complaint  Patient presents with   other    Had to go to the hospital they gave him a lot of medication. Tongue swollen and has spots on it, and hurts, had SOB and blood sugar got bad, supposed to be on oxygen  now per hospital, 02 not reading pt. Is SOB and tips of fingers are cold,        HPI:  Discussed the use of AI scribe software for clinical note transcription with the patient, who gave verbal consent to proceed.  History of Present Illness   Tegan Burnside is a 74 year old male who presents with painful spots on his tongue and lips.  He has been experiencing painful spots on his tongue and lips since Christmas Eve. The pain is severe, affecting his ability to eat, drink, and sleep. The spots on his lips have resolved with the application of a salve, but the spots on his tongue persist, causing significant discomfort.  He has been using a cough drop with lidocaine  for some relief, but the pain remains intense. He also tried non-alcoholic Listerine, which resulted in a burning sensation. He suspects the spots may be related to an allergic reaction to medication received during a recent hospital stay, although he is uncertain.  He is taking hydrocodone  (Norco) for pain, with a dose of two tablets in the morning, but is unsure if it alleviates the tongue pain. He also uses inhalers, specifically Breztri , for respiratory issues.  He reports low blood pressure and has been holding his blood pressure medication, valsartan  (Diovan ), for a few days. He is attempting to stay hydrated by drinking iced tea, although he finds it challenging due to the pain.  He has a history of diabetes and has adjusted his insulin  dosage (Lantus ) in response to previous steroid use, which affected his blood sugar levels. His blood sugar levels have been around 150 recently, which he considers stable  compared to previous higher readings.  No swollen tongue prior to his recent hospital visit. He reports difficulty walking and has oxygen  in his car but did not bring it to the appointment.         OBJECTIVE:       02/08/2024    1:31 PM  Depression screen PHQ 2/9  Decreased Interest 0  Down, Depressed, Hopeless 0  PHQ - 2 Score 0     BP Readings from Last 3 Encounters:  04/18/24 (!) 96/58  04/15/24 109/84  04/14/24 118/64    BP (!) 96/58   Pulse 70   Wt 164 lb 9.6 oz (74.7 kg)   SpO2 94%   BMI 27.39 kg/m    Physical Exam   GENERAL: General appearance normal.      Physical Exam Constitutional:      Appearance: Normal appearance.  Neurological:     General: No focal deficit present.     Mental Status: He is alert and oriented to person, place, and time. Mental status is at baseline.     ASSESSMENT/PLAN:   Assessment & Plan Tongue pain  Hypotension due to hypovolemia    Assessment and Plan    Painful tongue lesions with blisters and swelling Likely allergic reaction or fungal infection causing significant pain and affecting eating and drinking. - Prescribed Magic Mouthwash with lidocaine  for pain relief and antifungal properties. - Advised taking Benadryl   daily for the next few days to reduce swelling. - Instructed to contact the office by Tuesday if no improvement for potential ENT referral. - Discussed potential need for steroids if symptoms persist, with caution regarding blood sugar levels.  Type 2 diabetes mellitus Recent blood sugar levels around 150 mg/dL. Previous steroid use elevated blood sugar levels. Current insulin  regimen includes Lantus  with recent dosage adjustments. - Advised increasing Lantus  to 15 units in the morning, with potential increase to 20 units if needed. - Instructed to monitor blood sugar levels closely, especially if steroids are used in the future.  Hypotension due to dehydration Hypotension likely secondary to  dehydration, indicated by low blood pressure readings and limited oral intake. - Advised holding valsartan  (Diovan ) for a few days. - Encouraged increased fluid intake, including calorie-free Gatorade, to address dehydration.         Ganon Demasi A. Vita MD [facility] "

## 2024-04-18 NOTE — Telephone Encounter (Signed)
 FYI Only or Action Required?: FYI only for provider: appointment scheduled on 1/2.  Patient was last seen in primary care on 03/11/2024 by Vita Morrow, MD.  Called Nurse Triage reporting Oral Swelling.  Symptoms began several days ago.  Interventions attempted: Nothing.  Symptoms are: unchanged.  Triage Disposition: Go to ED Now (Notify PCP)  Patient/caregiver understands and will follow disposition?: Yes              Copied from CRM #[id number]. Topic: Clinical - Red Word Triage >> Apr 18, 2024  9:30 AM China J wrote: [facility] that prompted transfer to Nurse Triage: Patient discharged from hospital due to issues with his breathing and an infection on the 30th, but now his tongue is swollen with pain. Reason for Disposition  All other people with tongue swelling  (Exception: Tongue swelling is a recurrent problem AND NO swelling at present.)  Answer Assessment - Initial Assessment Questions 1. ONSET: When did the swelling start? (e.g., minutes, hours, days)     4-5 days   2. LOCATION: What part of the tongue is swollen?     Whole tongue   3. SEVERITY: How swollen is it?     Patient stated the severity is moderate.   4. CAUSE: What do you think is causing the tongue swelling? (e.g., history of angioedema, allergies)      Patient suspects amoxicillin  in which he has 3 more doses of treatment   5. RECURRENT SYMPTOM: Have you had tongue swelling before? If Yes, ask: When was the last time? What happened that time?     No   6. OTHER SYMPTOMS: Do you have any other symptoms? (e.g., breathing difficulty, facial swelling)   Tip of tongue pain, pain when applying dentures.      Patient called in to triage with complaints of tongue swelling This has been ongoing for 4-5 days  The patient stated he was seen in ED for SOB on 12/30 and was started on amoxicillin . He has 3 doses left, and he suspects that is causing the symptoms. No SOB, respiratory distress  noted.   Appointment scheduled for further evaluation as symptoms have been ongoing x several days; patient agrees with the plan of care, and will reach out if symptoms worsen or persist.  Protocols used: Tongue Swelling-A-AH

## 2024-04-18 NOTE — Telephone Encounter (Signed)
 Copied from CRM #[id number]. Topic: Clinical - Home Health Verbal Orders >> Apr 18, 2024  9:44 AM Herma MATSU wrote: Caller/Agency: Ms. Morna from [facility] Callback Number: [phone] Service Requested: Skilled Nursing Frequency: 2 times a week for 3 weeks, 1 time a week for 6 weeks Any new concerns about the patient? No

## 2024-04-22 ENCOUNTER — Ambulatory Visit: Payer: Self-pay

## 2024-04-22 NOTE — Telephone Encounter (Addendum)
 Tried to call pt to offer appt here in the office today, not able to reach  get recording that the number is not in service

## 2024-04-22 NOTE — Telephone Encounter (Signed)
 This RN called [facility] to notify of ED refusal.

## 2024-04-22 NOTE — Telephone Encounter (Signed)
 FYI Only or Action Required?: Action required by provider: refused ED.  Patient was last seen in primary care on 04/18/2024 by Vita Morrow, MD.  Called Nurse Triage reporting Breathing Problem and Cough.  Symptoms began several weeks ago.  Interventions attempted: Other: home oxygen .  Symptoms are: gradually worsening.  Triage Disposition: Go to ED Now (or PCP Triage)  Patient/caregiver understands and will follow disposition?: No, refuses disposition                                  1. RESPIRATORY STATUS: Describe your breathing? (e.g., wheezing, shortness of breath, unable to speak, severe coughing)      SOB/huffing and puffing upon exertion and talking 2. ONSET: When did this breathing problem begin?      Since hospital admission on 04/10/24, reports breathing has worsened since hospital admission  4. SEVERITY: How bad is your breathing? (e.g., mild, moderate, severe)      Mild 5. RECURRENT SYMPTOM: Have you had difficulty breathing before? If Yes, ask: When was the last time? and What happened that time?      Denies 9. OTHER SYMPTOMS: Do you have any other symptoms? (e.g., chest pain, cough, dizziness, fever, runny nose)     Intermittent nose bleeds since starting oxygen , cough that produces creamy-colored mucous, foul smelling urine, urinary hesitation, wheezing Denies dizziness, denies lightheadedness, denies chest pain, denies painful urination, denies abdominal pain, denies fever 10. O2 SATURATION MONITOR:  Do you use an oxygen  saturation monitor (pulse oximeter) at home? If Yes, ask: What is your reading (oxygen  level) today? What is your usual oxygen  saturation reading? (e.g., 95%)     Denies access to pulse oximeter at this time    Signe Eastern, PT, with [facility] called to report foul smelling urine, dark sputum and intermittent bloody nose within the patient. Signe also reported that patient's oxygen  saturation is 88% at rest  and drops to 86% when walking, while on oxygen . Signe also rquested to verify the patient's dose of Toujeo  and DME company for his oxygen . Please advise on this. Jim's number is [phone].  This RN called patient to further evaluate symptoms. This RN advised ED. Patient declined, due to wait times. Please advise.   Reason for Disposition  Patient sounds very sick or weak to the triager  Protocols used: Breathing Difficulty-A-AH  Copied from CRM #[id number]. Topic: Clinical - Red Word Triage >> Apr 22, 2024 11:41 AM China J wrote: [facility] that prompted transfer to Nurse Triage: Signe Eastern, a physical therapist with [facility] is calling to report dark and smelly urine as well as dark sputum. The patient has also been dealing with nose bleeds for the last several days with his most recent one being today.  He would also like to verify the dose of the patient's toujeo .  Daisey also wanted to report that he is new to oxygen  and has a water  bottle by his tank to help with humidity.)

## 2024-04-23 ENCOUNTER — Other Ambulatory Visit: Payer: Self-pay

## 2024-04-23 ENCOUNTER — Ambulatory Visit: Admitting: Family Medicine

## 2024-04-23 ENCOUNTER — Other Ambulatory Visit

## 2024-04-23 ENCOUNTER — Other Ambulatory Visit (HOSPITAL_COMMUNITY): Payer: Self-pay

## 2024-04-23 VITALS — BP 104/60 | HR 62 | Temp 97.2°F | Resp 15 | Wt 166.6 lb

## 2024-04-23 DIAGNOSIS — E291 Testicular hypofunction: Secondary | ICD-10-CM | POA: Diagnosis not present

## 2024-04-23 DIAGNOSIS — K140 Glossitis: Secondary | ICD-10-CM | POA: Diagnosis not present

## 2024-04-23 DIAGNOSIS — J189 Pneumonia, unspecified organism: Secondary | ICD-10-CM | POA: Diagnosis not present

## 2024-04-23 MED ORDER — LIDOCAINE VISCOUS HCL 2 % MT SOLN
5.0000 mL | Freq: Three times a day (TID) | OROMUCOSAL | 0 refills | Status: AC
  Filled 2024-04-23: qty 180, 12d supply, fill #0

## 2024-04-23 MED ORDER — AMOXICILLIN-POT CLAVULANATE 875-125 MG PO TABS
1.0000 | ORAL_TABLET | Freq: Two times a day (BID) | ORAL | 0 refills | Status: DC
  Filled 2024-04-23: qty 10, 5d supply, fill #0

## 2024-04-23 NOTE — Progress Notes (Signed)
" ° °  Subjective:    Patient ID: Paul [name], male    DOB: [birth year], 74 y.o.   MRN: [id number]  Discussed the use of AI scribe software for clinical note transcription with the patient, who gave verbal consent to proceed.  History of Present Illness   Rickardo Brinegar is a 74 year old male with COPD and liver cirrhosis who presents with shortness of breath and follow-up after recent hospitalizations.  He experienced shortness of breath following recent hospitalizations for respiratory failure with hypoxia. Initially admitted on December 25th and discharged on December 29th, he was readmitted on December 30th due to low oxygen  levels noted by a home nurse. Discharge plans included home oxygen , which was not initially delivered, leading to readmission. He currently uses home oxygen  at 2 liters per minute and reports feeling about 60% better now. He still experiences some difficulty breathing without oxygen  and does not have a pulse oximeter at home.  He has a history of COPD and liver cirrhosis. He quit smoking 25 years ago and occasionally consumes two to three beers a week. He is on the last doses of Augmentin  and uses an inhaler, which he feels helps with his breathing. He reports tripping over the oxygen  cord at home.  He reports a sore and raw tongue, which was previously blistered, and has been using a 'magic mouthwash' that has provided relief. His throat is sore as well. He has experienced stress and anxiety, which he feels contributes to his symptoms.           Review of Systems     Objective:    Physical Exam Physical Exam   VITALS: SaO2- 99% HEENT: Tongue appears raw, otherwise oral cavity normal. CHEST: Lungs clear to auscultation bilaterally.            Assessment & Plan:     Community acquired pneumonia with chronic respiratory failure in the setting of COPD Recent hospitalization for respiratory failure due to pneumonia. On home oxygen  therapy. Reports 60% symptom  improvement but persistent dyspnea. Oxygen  saturation 98% with oxygen . Inhalers improve breathing. Antibiotic course with Augmentin  nearly complete. - Prescribed additional Augmentin  to complete antibiotic course. - Advised obtaining a pulse oximeter to monitor oxygen  saturation at home. - Continue home oxygen  therapy at 2 L/min. - Continue using inhalers as prescribed. - Scheduled follow-up in one week.  Glossitis Tongue swelling and irritation with blisters. No signs of thrush or other oral infections. - Prescribed magic mouthwash with lidocaine  and antifungal. - Advised obtaining magic mouthwash from the drugstore.        "

## 2024-04-24 ENCOUNTER — Other Ambulatory Visit: Payer: Self-pay

## 2024-04-24 ENCOUNTER — Other Ambulatory Visit (HOSPITAL_COMMUNITY): Payer: Self-pay

## 2024-04-24 DIAGNOSIS — E1122 Type 2 diabetes mellitus with diabetic chronic kidney disease: Secondary | ICD-10-CM | POA: Diagnosis not present

## 2024-04-24 DIAGNOSIS — J9601 Acute respiratory failure with hypoxia: Secondary | ICD-10-CM | POA: Diagnosis not present

## 2024-04-24 DIAGNOSIS — J440 Chronic obstructive pulmonary disease with acute lower respiratory infection: Secondary | ICD-10-CM | POA: Diagnosis not present

## 2024-04-24 DIAGNOSIS — J441 Chronic obstructive pulmonary disease with (acute) exacerbation: Secondary | ICD-10-CM | POA: Diagnosis not present

## 2024-04-24 DIAGNOSIS — F419 Anxiety disorder, unspecified: Secondary | ICD-10-CM | POA: Diagnosis not present

## 2024-04-24 DIAGNOSIS — I952 Hypotension due to drugs: Secondary | ICD-10-CM | POA: Diagnosis not present

## 2024-04-24 DIAGNOSIS — D631 Anemia in chronic kidney disease: Secondary | ICD-10-CM | POA: Diagnosis not present

## 2024-04-24 DIAGNOSIS — E669 Obesity, unspecified: Secondary | ICD-10-CM | POA: Diagnosis not present

## 2024-04-24 DIAGNOSIS — E559 Vitamin D deficiency, unspecified: Secondary | ICD-10-CM | POA: Diagnosis not present

## 2024-04-24 DIAGNOSIS — J181 Lobar pneumonia, unspecified organism: Secondary | ICD-10-CM | POA: Diagnosis not present

## 2024-04-24 DIAGNOSIS — K219 Gastro-esophageal reflux disease without esophagitis: Secondary | ICD-10-CM | POA: Diagnosis not present

## 2024-04-24 DIAGNOSIS — E785 Hyperlipidemia, unspecified: Secondary | ICD-10-CM | POA: Diagnosis not present

## 2024-04-24 LAB — COMPREHENSIVE METABOLIC PANEL WITH GFR
ALT: 45 IU/L — ABNORMAL HIGH (ref 0–44)
AST: 37 IU/L (ref 0–40)
Albumin: 3.6 g/dL — ABNORMAL LOW (ref 3.8–4.8)
Alkaline Phosphatase: 169 IU/L — ABNORMAL HIGH (ref 47–123)
BUN/Creatinine Ratio: 20 (ref 10–24)
BUN: 30 mg/dL — ABNORMAL HIGH (ref 8–27)
Bilirubin Total: <0.2 mg/dL (ref 0.0–1.2)
CO2: 18 mmol/L — ABNORMAL LOW (ref 20–29)
Calcium: 8.7 mg/dL (ref 8.6–10.2)
Chloride: 104 mmol/L (ref 96–106)
Creatinine, Ser: 1.53 mg/dL — ABNORMAL HIGH (ref 0.76–1.27)
Globulin, Total: 2.8 g/dL (ref 1.5–4.5)
Glucose: 133 mg/dL — ABNORMAL HIGH (ref 70–99)
Potassium: 5 mmol/L (ref 3.5–5.2)
Sodium: 137 mmol/L (ref 134–144)
Total Protein: 6.4 g/dL (ref 6.0–8.5)
eGFR: 47 mL/min/1.73 — ABNORMAL LOW

## 2024-04-24 LAB — CBC WITH DIFFERENTIAL/PLATELET
Basophils Absolute: 0.1 x10E3/uL (ref 0.0–0.2)
Basos: 1 %
EOS (ABSOLUTE): 0.2 x10E3/uL (ref 0.0–0.4)
Eos: 3 %
Hematocrit: 38.8 % (ref 37.5–51.0)
Hemoglobin: 12 g/dL — ABNORMAL LOW (ref 13.0–17.7)
Immature Grans (Abs): 0 x10E3/uL (ref 0.0–0.1)
Immature Granulocytes: 0 %
Lymphocytes Absolute: 2.1 x10E3/uL (ref 0.7–3.1)
Lymphs: 34 %
MCH: 28.4 pg (ref 26.6–33.0)
MCHC: 30.9 g/dL — ABNORMAL LOW (ref 31.5–35.7)
MCV: 92 fL (ref 79–97)
Monocytes Absolute: 0.4 x10E3/uL (ref 0.1–0.9)
Monocytes: 7 %
Neutrophils Absolute: 3.5 x10E3/uL (ref 1.4–7.0)
Neutrophils: 54 %
Platelets: 237 x10E3/uL (ref 150–450)
RBC: 4.22 x10E6/uL (ref 4.14–5.80)
RDW: 14.9 % (ref 11.6–15.4)
WBC: 6.3 x10E3/uL (ref 3.4–10.8)

## 2024-04-24 LAB — TESTOSTERONE: Testosterone: 51 ng/dL — ABNORMAL LOW (ref 264–916)

## 2024-04-25 ENCOUNTER — Ambulatory Visit: Payer: Self-pay | Admitting: Family Medicine

## 2024-04-25 ENCOUNTER — Telehealth: Payer: Self-pay

## 2024-04-25 ENCOUNTER — Other Ambulatory Visit: Payer: Self-pay

## 2024-04-25 DIAGNOSIS — R309 Painful micturition, unspecified: Secondary | ICD-10-CM

## 2024-04-25 NOTE — Telephone Encounter (Signed)
 I called Angeol with [facility] and gave a verbal order for the urine. I also put in an order for UA and urine culture because she said she was taking the sample to a [facility] location.   Copied from CRM #[id number]. Topic: Clinical - Request for Lab/Test Order >> Apr 25, 2024 11:16 AM Donna BRAVO wrote: Reason for CRM: Angeol with [facility] requesting a urine sample order  Patient has dark urine burning, difficulty urinating possible UTI

## 2024-04-28 ENCOUNTER — Telehealth: Payer: Self-pay | Admitting: Internal Medicine

## 2024-04-28 DIAGNOSIS — G4733 Obstructive sleep apnea (adult) (pediatric): Secondary | ICD-10-CM

## 2024-04-28 DIAGNOSIS — I509 Heart failure, unspecified: Secondary | ICD-10-CM

## 2024-04-28 NOTE — Telephone Encounter (Signed)
 Placed orders for O2 to [facility]

## 2024-04-28 NOTE — Telephone Encounter (Signed)
 Dr. Joyce. Can you please co-sign the DME O2 order for patient. Catheline is ok with this but [facility] will not take sarabeth signature since you recently saw patient.  Can you also addend your note stating that he needs a portable O2 tank to carry outside of house with him.

## 2024-04-28 NOTE — Telephone Encounter (Signed)
 Please advise if ok to write for?  Copied from CRM [id number]. Topic: Clinical - Order For Equipment >> Apr 28, 2024  9:13 AM Willma SAUNDERS wrote: Reason for CRM: Patient is requesting orders for a Portable Oxygen  Concentrator. Says he is on oxygen  and has a tank for when he goes out but has difficulty moving around with it as it is heavy.  Patient can be reached at [phone]

## 2024-04-28 NOTE — Addendum Note (Signed)
 Addended by: VICCI HUSBAND A on: 04/28/2024 04:46 PM   Modules accepted: Orders

## 2024-04-29 NOTE — Telephone Encounter (Signed)
 He also needs a portable O2 tank so we can use it outside the house.

## 2024-05-01 ENCOUNTER — Other Ambulatory Visit (HOSPITAL_COMMUNITY): Payer: Self-pay

## 2024-05-01 ENCOUNTER — Telehealth: Payer: Self-pay

## 2024-05-01 ENCOUNTER — Telehealth (HOSPITAL_COMMUNITY): Payer: Self-pay | Admitting: Pharmacist

## 2024-05-01 ENCOUNTER — Ambulatory Visit: Admitting: Nurse Practitioner

## 2024-05-01 ENCOUNTER — Encounter: Payer: Self-pay | Admitting: Nurse Practitioner

## 2024-05-01 ENCOUNTER — Other Ambulatory Visit: Payer: Self-pay

## 2024-05-01 VITALS — BP 110/70 | HR 73 | Wt 169.8 lb

## 2024-05-01 DIAGNOSIS — D649 Anemia, unspecified: Secondary | ICD-10-CM | POA: Diagnosis not present

## 2024-05-01 DIAGNOSIS — Z794 Long term (current) use of insulin: Secondary | ICD-10-CM

## 2024-05-01 DIAGNOSIS — E349 Endocrine disorder, unspecified: Secondary | ICD-10-CM | POA: Diagnosis not present

## 2024-05-01 DIAGNOSIS — K7460 Unspecified cirrhosis of liver: Secondary | ICD-10-CM | POA: Diagnosis not present

## 2024-05-01 DIAGNOSIS — K5909 Other constipation: Secondary | ICD-10-CM | POA: Diagnosis not present

## 2024-05-01 DIAGNOSIS — J9621 Acute and chronic respiratory failure with hypoxia: Secondary | ICD-10-CM | POA: Diagnosis not present

## 2024-05-01 DIAGNOSIS — E118 Type 2 diabetes mellitus with unspecified complications: Secondary | ICD-10-CM

## 2024-05-01 DIAGNOSIS — N183 Chronic kidney disease, stage 3 unspecified: Secondary | ICD-10-CM | POA: Diagnosis not present

## 2024-05-01 DIAGNOSIS — E1122 Type 2 diabetes mellitus with diabetic chronic kidney disease: Secondary | ICD-10-CM

## 2024-05-01 DIAGNOSIS — Z9981 Dependence on supplemental oxygen: Secondary | ICD-10-CM | POA: Diagnosis not present

## 2024-05-01 DIAGNOSIS — J449 Chronic obstructive pulmonary disease, unspecified: Secondary | ICD-10-CM | POA: Diagnosis not present

## 2024-05-01 DIAGNOSIS — G4733 Obstructive sleep apnea (adult) (pediatric): Secondary | ICD-10-CM

## 2024-05-01 MED ORDER — TESTOSTERONE CYPIONATE 100 MG/ML IM SOLN
100.0000 mg | INTRAMUSCULAR | 0 refills | Status: DC
  Filled 2024-05-01: qty 10, 140d supply, fill #0

## 2024-05-01 MED ORDER — TESTOSTERONE CYPIONATE 200 MG/ML IM SOLN
100.0000 mg | INTRAMUSCULAR | 0 refills | Status: AC
  Filled 2024-05-01 – 2024-05-09 (×2): qty 6, 84d supply, fill #0

## 2024-05-01 NOTE — Assessment & Plan Note (Addendum)
 Blood sugar levels have been well-controlled since hospital discharge, with occasional spikes after dietary indiscretions.  Orders:   Hemoglobin A1c

## 2024-05-01 NOTE — Telephone Encounter (Signed)
 PA request has been Received. New Encounter has been or will be created for follow up. For additional info see Pharmacy Prior Auth telephone encounter from 05/01/24.

## 2024-05-01 NOTE — Patient Instructions (Addendum)
 Looking back in your chart there was imaging done in 2024 with [facility] showed that you had some cirrhosis of the liver.  Please stop all alcohol  intake to help prevent further damage to the liver.  I am sending a referral for the liver specialist they will call you to get scheduled.   I will let you know what the labs show.   I will get some information together about the liver.

## 2024-05-01 NOTE — Progress Notes (Signed)
" ° °  05/01/2024  Patient ID: Paul [name], male   DOB: 03/14/1951, 74 y.o.   MRN: [id number]  Received notification from [facility] that patient has been APPROVED to continue to receive Jardiance  10mg  through 04/16/25.  "

## 2024-05-01 NOTE — Assessment & Plan Note (Addendum)
 Mild cirrhosis identified on CT scan in 2024 located in care everywhere from external GI provider. No documentation in current chart identified. Patient has not had f/u with GI. No alarm symptoms of liver failure. Abdomen is not distended and no evidence of fluid overload or scleral changes. We discussed this diagnosis and the importance of preservation of liver function. Avoidance of alcohol  is crucial to prevent further liver damage. Based on patients reports of alcohol  intake, he has not had excessive intake, with 2 drinks or less in a setting, and no daily alcohol  intake. No risks for alcohol  withdrawal or inability to cease alcohol  consumption identified at that time. We discussed that many factors could contribute to cirrhosis and alcohol  consumption is only one factor. Reassurance and support provided. Recommend high protein, high calorie intake with care to avoid elevation in blood sugar levels.  - Referred to liver specialist for further evaluation and management - Advised avoidance of alcohol  to prevent further liver damage - Keep acetaminophen  (Tylenol ) intake to a minimum. Will need evaluation of pain management plan.  Orders:   CBC with Differential/Platelet   Comprehensive metabolic panel with GFR   Iron , TIBC and Ferritin Panel   Vitamin B12   HepB+HepC+HIV Panel   Amb Referral to Hepatology

## 2024-05-01 NOTE — Assessment & Plan Note (Addendum)
 Chronic respiratory failure with hypoxia requiring continuous oxygen  therapy. Difficulty with mobility due to cumbersome oxygen  equipment. No recent falls, but a fall occurred a month ago due to uneven ground. Lungs sound clear today with no alarm symptoms.  - Ordered portable oxygen  machine with carrying bag - Ensure continuous oxygen  therapy at 2 liters per minute - Advised carrying phone at all times for safety

## 2024-05-01 NOTE — Progress Notes (Signed)
 "  Catheline Doing, DNP, AGNP-c [facility]  [address] [place], [place] [address] [phone]   ESTABLISHED PATIENT- Chronic Health and/or Follow-Up Visit on 05/01/2024  Blood pressure 110/70, pulse 73, weight 169 lb 12.8 oz (77 kg), SpO2 98%.   Subjective:  other (Balance is better, needs a portable oxygen  machine, due to balance issues and using cane needs a smaller portable oxygen  tank, was told at the hospital he drank too much, and had cirrhosis he said he only drink maybe 6 beers a week if that, pt. Needs a portable O2 tank that he can carry around when he goes to appointments. )  History of Present Illness Paul [name] is a 74 year old male with respiratory issues and cirrhosis who presents for evaluation of his oxygen  needs and liver condition.  He uses continuous oxygen  therapy at two liters per minute. He uses a large oxygen  concentrator at home and needs a portable oxygen  machine for mobility. He has difficulty moving around with the current setup and experienced a fall about a month ago due to uneven ground, though he was able to get up by himself. He uses a cane but finds it cumbersome indoors.  He recalls a recent hospitalization where he was informed of having cirrhosis of the liver, which was unexpected as he was previously unaware of this diagnosis. He mentions being told he was an alcoholic, which he disputes, stating he drinks beer occasionally, about six beers a week at most. He recalls having a fatty liver diagnosis in 2022 and questions the connection between his liver condition and his respiratory issues.  He experiences constipation, using Miralax regularly, but still struggles with bowel movements, describing them as hard and infrequent. He drinks unsweetened tea regularly and considers it as part of his fluid intake. He has tried stronger laxatives in the past with temporary success.  He mentions low testosterone  levels and previous use of a  testosterone  cream, which caused discomfort and pain in his shoulder and legs, leading him to discontinue its use. He dislikes needles but is open to trying alternative treatments for his low energy levels.  His social activities include visiting a local bar for socializing, where he occasionally drinks beer. He emphasizes that his alcohol  consumption is moderate and primarily social. He also mentions having a friend who died following a colonoscopy, which has made him hesitant to undergo the procedure himself.  ROS negative except for what is listed in HPI. History, Medications, Surgery, SDOH, and Family History reviewed and updated as appropriate.  Objective:  Physical Exam Vitals and nursing note reviewed.  Constitutional:      Appearance: Normal appearance.  HENT:     Head: Normocephalic.  Eyes:     General: No scleral icterus.    Pupils: Pupils are equal, round, and reactive to light.  Cardiovascular:     Rate and Rhythm: Normal rate and regular rhythm.     Pulses: Normal pulses.     Heart sounds: Normal heart sounds.  Pulmonary:     Effort: Pulmonary effort is normal.     Breath sounds: Normal breath sounds.  Abdominal:     General: There is no distension.     Palpations: There is no mass.     Tenderness: There is no abdominal tenderness. There is no right CVA tenderness, left CVA tenderness or guarding.  Musculoskeletal:        General: Normal range of motion.     Cervical back: Normal range of motion.  Right lower leg: No edema.     Left lower leg: No edema.  Skin:    General: Skin is warm.  Neurological:     General: No focal deficit present.     Mental Status: He is alert and oriented to person, place, and time.     Motor: Weakness present.     Coordination: Coordination abnormal.     Gait: Gait abnormal.  Psychiatric:        Mood and Affect: Mood normal.         Assessment & Plan:   Assessment & Plan Cirrhosis of liver without ascites, unspecified  hepatic cirrhosis type (HCC) Mild cirrhosis identified on CT scan in 2024 located in care everywhere from external GI provider. No documentation in current chart identified. Patient has not had f/u with GI. No alarm symptoms of liver failure. Abdomen is not distended and no evidence of fluid overload or scleral changes. We discussed this diagnosis and the importance of preservation of liver function. Avoidance of alcohol  is crucial to prevent further liver damage. Based on patients reports of alcohol  intake, he has not had excessive intake, with 2 drinks or less in a setting, and no daily alcohol  intake. No risks for alcohol  withdrawal or inability to cease alcohol  consumption identified at that time. We discussed that many factors could contribute to cirrhosis and alcohol  consumption is only one factor. Reassurance and support provided. Recommend high protein, high calorie intake with care to avoid elevation in blood sugar levels.  - Referred to liver specialist for further evaluation and management - Advised avoidance of alcohol  to prevent further liver damage - Keep acetaminophen  (Tylenol ) intake to a minimum. Will need evaluation of pain management plan.  Orders:   CBC with Differential/Platelet   Comprehensive metabolic panel with GFR   Iron , TIBC and Ferritin Panel   Vitamin B12   HepB+HepC+HIV Panel   Amb Referral to Hepatology  Acute on chronic respiratory failure with hypoxia (HCC) OSA and COPD overlap syndrome (HCC) Chronic respiratory failure with hypoxia requiring continuous oxygen  therapy. Difficulty with mobility due to cumbersome oxygen  equipment. No recent falls, but a fall occurred a month ago due to uneven ground. Lungs sound clear today with no alarm symptoms.  - Ordered portable oxygen  machine with carrying bag - Ensure continuous oxygen  therapy at 2 liters per minute - Advised carrying phone at all times for safety     Anemia, unspecified type Repeat labs for monitoring.  Possibly related to underlying liver concerns.  Orders:   CBC with Differential/Platelet   Comprehensive metabolic panel with GFR   Iron , TIBC and Ferritin Panel   Vitamin B12  Testosterone  deficiency Previous topical testosterone  cream caused shoulder pain. Interested in trying testosterone  injections for energy improvement. Discussed potential benefits of injections over topical application and monitoring requirements. I am hopeful for improvement of energy levels with improved levels.  Orders:   testosterone  cypionate (DEPO-TESTOSTERONE ) 200 MG/ML injection; Inject 0.5 mLs (100 mg total) into the muscle every 14 (fourteen) days. Bring vial to office for administration. **single use vials**  DM (diabetes mellitus), type 2 with complications (HCC) Type 2 diabetes mellitus with stage 3 chronic kidney disease, with long-term current use of insulin , unspecified whether stage 3a or 3b CKD (HCC) Blood sugar levels have been well-controlled since hospital discharge, with occasional spikes after dietary indiscretions.  Orders:   Hemoglobin A1c  Chronic constipation Difficulty passing hard stools despite Miralax use. Possible need for further evaluation due to cirrhosis. Discussed increasing  Miralax dosage and ensuring adequate hydration. - Increase Miralax to daily use with a capful - Ensure adequate hydration with at least two glasses of water  per day - Will consider further evaluation if constipation persists     Portable oxygen  machine.2L [place] 24 hours a day. With carrying case. Uses a cane for ambulation.    Arthea Nobel E Kirat Mezquita, DNP, AGNP-c    "

## 2024-05-02 LAB — CBC WITH DIFFERENTIAL/PLATELET
Basophils Absolute: 0 x10E3/uL (ref 0.0–0.2)
Basos: 1 %
EOS (ABSOLUTE): 0.1 x10E3/uL (ref 0.0–0.4)
Eos: 5 %
Hematocrit: 35.7 % — ABNORMAL LOW (ref 37.5–51.0)
Hemoglobin: 10.8 g/dL — ABNORMAL LOW (ref 13.0–17.7)
Immature Grans (Abs): 0 x10E3/uL (ref 0.0–0.1)
Immature Granulocytes: 0 %
Lymphocytes Absolute: 1 x10E3/uL (ref 0.7–3.1)
Lymphs: 31 %
MCH: 27.2 pg (ref 26.6–33.0)
MCHC: 30.3 g/dL — ABNORMAL LOW (ref 31.5–35.7)
MCV: 90 fL (ref 79–97)
Monocytes Absolute: 0.2 x10E3/uL (ref 0.1–0.9)
Monocytes: 7 %
Neutrophils Absolute: 1.7 x10E3/uL (ref 1.4–7.0)
Neutrophils: 56 %
Platelets: 125 x10E3/uL — ABNORMAL LOW (ref 150–450)
RBC: 3.97 x10E6/uL — ABNORMAL LOW (ref 4.14–5.80)
RDW: 14.5 % (ref 11.6–15.4)
WBC: 3.1 x10E3/uL — ABNORMAL LOW (ref 3.4–10.8)

## 2024-05-02 LAB — HEMOGLOBIN A1C
Est. average glucose Bld gHb Est-mCnc: 189 mg/dL
Hgb A1c MFr Bld: 8.2 % — AB (ref 4.8–5.6)

## 2024-05-02 LAB — IRON,TIBC AND FERRITIN PANEL
Ferritin: 28 ng/mL — AB (ref 30–400)
Iron Saturation: 7 % — AB (ref 15–55)
Iron: 24 ug/dL — AB (ref 38–169)
Total Iron Binding Capacity: 346 ug/dL (ref 250–450)
UIBC: 322 ug/dL (ref 111–343)

## 2024-05-02 LAB — COMPREHENSIVE METABOLIC PANEL WITH GFR
ALT: 42 IU/L (ref 0–44)
AST: 47 IU/L — AB (ref 0–40)
Albumin: 3.9 g/dL (ref 3.8–4.8)
Alkaline Phosphatase: 126 IU/L — AB (ref 47–123)
BUN/Creatinine Ratio: 16 (ref 10–24)
BUN: 15 mg/dL (ref 8–27)
Bilirubin Total: 0.2 mg/dL (ref 0.0–1.2)
CO2: 19 mmol/L — AB (ref 20–29)
Calcium: 9 mg/dL (ref 8.6–10.2)
Chloride: 106 mmol/L (ref 96–106)
Creatinine, Ser: 0.93 mg/dL (ref 0.76–1.27)
Globulin, Total: 2.5 g/dL (ref 1.5–4.5)
Glucose: 147 mg/dL — AB (ref 70–99)
Potassium: 4.4 mmol/L (ref 3.5–5.2)
Sodium: 140 mmol/L (ref 134–144)
Total Protein: 6.4 g/dL (ref 6.0–8.5)
eGFR: 86 mL/min/1.73

## 2024-05-02 LAB — HEPB+HEPC+HIV PANEL
HIV Screen 4th Generation wRfx: NONREACTIVE
Hep B C IgM: NEGATIVE
Hep B Core Total Ab: NEGATIVE
Hep B E Ab: NONREACTIVE
Hep B E Ag: NEGATIVE
Hep B Surface Ab, Qual: NONREACTIVE
Hep C Virus Ab: NONREACTIVE
Hepatitis B Surface Ag: NEGATIVE

## 2024-05-02 LAB — VITAMIN B12: Vitamin B-12: >2000 pg/mL — AB (ref 232–1245)

## 2024-05-05 ENCOUNTER — Telehealth: Payer: Self-pay

## 2024-05-05 NOTE — Telephone Encounter (Signed)
 Copied from CRM #[id number]. Topic: Clinical - Medical Advice >> May 05, 2024 11:06 AM Deaijah H wrote: Reason for CRM: Signe PT w/ [facility] called in stating patient struggling with constipation. Is merilax ok to take and if that does not help then what to do [phone])   ----------------------------------------------------------------------- From previous Reason for Contact - Medication Question: Reason for CRM:    ----------------------------------------------------------------------- From previous Reason for Contact - Medication Question: Reason for CRM:

## 2024-05-06 ENCOUNTER — Ambulatory Visit: Payer: Self-pay | Admitting: Nurse Practitioner

## 2024-05-06 NOTE — Telephone Encounter (Signed)
 Called Patient and lefta detailed message, as well as PT at [facility].

## 2024-05-07 ENCOUNTER — Other Ambulatory Visit (HOSPITAL_COMMUNITY): Payer: Self-pay | Admitting: Nurse Practitioner

## 2024-05-07 ENCOUNTER — Other Ambulatory Visit (HOSPITAL_BASED_OUTPATIENT_CLINIC_OR_DEPARTMENT_OTHER): Payer: Self-pay

## 2024-05-07 ENCOUNTER — Telehealth (HOSPITAL_COMMUNITY): Payer: Self-pay

## 2024-05-07 DIAGNOSIS — D509 Iron deficiency anemia, unspecified: Secondary | ICD-10-CM | POA: Insufficient documentation

## 2024-05-07 NOTE — Telephone Encounter (Signed)
 In order to submit a Prior Authorization Request for Testosterone  Cypionate 200MG /ML intramuscular solution, the current Chart Note needs to be completed and signed. Submission will occur once this is completed.

## 2024-05-08 ENCOUNTER — Other Ambulatory Visit: Payer: Self-pay

## 2024-05-08 ENCOUNTER — Telehealth: Payer: Self-pay

## 2024-05-08 DIAGNOSIS — Z9981 Dependence on supplemental oxygen: Secondary | ICD-10-CM

## 2024-05-08 DIAGNOSIS — J9621 Acute and chronic respiratory failure with hypoxia: Secondary | ICD-10-CM

## 2024-05-08 DIAGNOSIS — G4733 Obstructive sleep apnea (adult) (pediatric): Secondary | ICD-10-CM

## 2024-05-08 NOTE — Telephone Encounter (Signed)
 Auth Submission: NO AUTH NEEDED Site of care: Site of care: [facility] Payer: Medicare A/B with supplement Medication & CPT/J Code(s) submitted: Feraheme (ferumoxytol) [id number] Diagnosis Code:  Route of submission (phone, fax, portal):  Phone # Fax # Auth type: Buy/Bill PB Units/visits requested: 510mg  x 1 dose Reference number:  Approval from: 05/08/24 to 08/14/24

## 2024-05-09 ENCOUNTER — Other Ambulatory Visit (HOSPITAL_COMMUNITY): Payer: Self-pay

## 2024-05-09 NOTE — Telephone Encounter (Signed)
 Pharmacy Patient Advocate Encounter  Received notification from [facility] that Prior Authorization for Testosterone  Cypionate 200MG /ML intramuscular solution  has been APPROVED from 05/07/24 to 05/09/25. Ran test claim, Copay is $10. This test claim was processed through [facility]- copay amounts may vary at other pharmacies due to pharmacy/plan contracts, or as the patient moves through the different stages of their insurance plan.   PA #/Case ID/Reference #: [id number]

## 2024-05-12 ENCOUNTER — Ambulatory Visit: Payer: Self-pay | Admitting: Nurse Practitioner

## 2024-05-13 ENCOUNTER — Telehealth: Payer: Self-pay | Admitting: Family Medicine

## 2024-05-13 ENCOUNTER — Ambulatory Visit: Payer: Self-pay

## 2024-05-13 NOTE — Telephone Encounter (Signed)
 Please call patient regarding his oxygen  tank.  He has never received it

## 2024-05-13 NOTE — Telephone Encounter (Signed)
 Signe with [facility] PT calling in due to missed a call from nurse triage. Appears nurse triage was able to get ahold of the patient. He wanted to confirm the patient's oxygen  orders. Read order to him Portable oxygen  system with bag. O2 2LNC continuous. For ambulation and activity inside and outside of home. He would like to know if it is okay for the patient to go up to [facility] due to how long the tubing is. His concern is that the patient takes his oxygen  off during activity around the home due to tubing/management. Patient doesn't have a pulse oximeter to check at home. Patient has portable tanks and regulator but wants concentrator like [employer].    VM confidential, okay to leave detailed message.

## 2024-05-13 NOTE — Telephone Encounter (Signed)
"   FYI Only or Action Required?: Action required by provider: clinical question for provider.  Patient was last seen in primary care on 05/01/2024 by Early, Camie BRAVO, NP.  Called Nurse Triage reporting Cough.  Symptoms began 1 year ago.  Interventions attempted: Nothing.  Symptoms are: stable.  Triage Disposition: See PCP When Office is Open (Within 3 Days)  Patient/caregiver understands and will follow disposition?: Yes  Copied from CRM #[id number]. Topic: Clinical - Medical Advice >> May 13, 2024 11:30 AM Willma R wrote: Reason for CRM: Signe From [facility] calling to advise patient continues to have brown sputum, and would like to confirm the flow rate for his oxygen  and is it continuous or as needed?  Signe can be reached at [phone] Reason for Disposition  Cough has been present for > 3 weeks  Answer Assessment - Initial Assessment Questions Signe, PT with [facility] called and left VM to return the call to the office. Patient called and advised of the call from PT regarding brown sputum and wanting to know about oxygen  if it's prn or continuous. Patient says the sputum is tan and only comes up after having a breathing treatment, the cough is not worse than normal cough. He says he wears his oxygne 2.5 L all the time and was told to wear it all the time, sats 94-97% on oxygen . He says the therapist told him that he can take it off a short time if he needs to but put it back on as soon as he can. Advised OV as scheduled on Thursday and to mention about the tan sputum. I added to the appointment notes so provider would address with the patient.    1. ONSET: When did the cough begin?      1 year ago  2. SEVERITY: How bad is the cough today?      Only after taking breathing treatment  3. SPUTUM: Describe the color of your sputum (e.g., none, dry cough; clear, white, yellow, green)     Tan colored  4. HEMOPTYSIS: Are you coughing up any blood? If Yes, ask: How much?  (e.g., flecks, streaks, tablespoons, etc.)     No  5. DIFFICULTY BREATHING: Are you having difficulty breathing? If Yes, ask: How bad is it? (e.g., mild, moderate, severe)      No  6. FEVER: Do you have a fever? If Yes, ask: What is your temperature, how was it measured, and when did it start?     No    8. LUNG HISTORY: Do you have any history of lung disease?  (e.g., pulmonary embolus, asthma, emphysema)     COPD on oxygen     10. OTHER SYMPTOMS: Do you have any other symptoms? (e.g., runny nose, wheezing, chest pain)       No  Protocols used: Cough - Acute Productive-A-AH  "

## 2024-05-15 ENCOUNTER — Other Ambulatory Visit (HOSPITAL_COMMUNITY): Payer: Self-pay

## 2024-05-15 ENCOUNTER — Ambulatory Visit: Admitting: Nurse Practitioner

## 2024-05-15 ENCOUNTER — Encounter: Payer: Self-pay | Admitting: Nurse Practitioner

## 2024-05-15 VITALS — BP 126/82 | HR 75 | Wt 172.6 lb

## 2024-05-15 DIAGNOSIS — J441 Chronic obstructive pulmonary disease with (acute) exacerbation: Secondary | ICD-10-CM | POA: Diagnosis not present

## 2024-05-15 DIAGNOSIS — J449 Chronic obstructive pulmonary disease, unspecified: Secondary | ICD-10-CM

## 2024-05-15 DIAGNOSIS — K7460 Unspecified cirrhosis of liver: Secondary | ICD-10-CM

## 2024-05-15 DIAGNOSIS — G4700 Insomnia, unspecified: Secondary | ICD-10-CM | POA: Diagnosis not present

## 2024-05-15 DIAGNOSIS — E1122 Type 2 diabetes mellitus with diabetic chronic kidney disease: Secondary | ICD-10-CM

## 2024-05-15 DIAGNOSIS — Z794 Long term (current) use of insulin: Secondary | ICD-10-CM

## 2024-05-15 DIAGNOSIS — E291 Testicular hypofunction: Secondary | ICD-10-CM

## 2024-05-15 DIAGNOSIS — G4733 Obstructive sleep apnea (adult) (pediatric): Secondary | ICD-10-CM | POA: Diagnosis not present

## 2024-05-15 MED ORDER — ZOLPIDEM TARTRATE 10 MG PO TABS: 10.0000 mg | ORAL_TABLET | Freq: Every evening | ORAL | 0 refills | Status: AC | PRN

## 2024-05-15 MED ORDER — DOXYCYCLINE HYCLATE 100 MG PO CAPS
100.0000 mg | ORAL_CAPSULE | Freq: Two times a day (BID) | ORAL | 0 refills | Status: AC
  Filled 2024-05-15: qty 14, 7d supply, fill #0

## 2024-05-15 NOTE — Assessment & Plan Note (Addendum)
 Acute exacerbation of COPD with brown phlegm production, possibly due to infection. Increased risk of pneumonia due to COPD, diabetes, and age. No fevers at this time. Recent hospitalization for pneumonia. Physical therapist recommended increasing oxygen  to 3 liters due to long hose length. Will trial doxycycline  for COPD exacerbation, but avoid oral steroids due to blood sugar at this time. We can consider this as an add on, if no improvement by Monday. Pulmonary toileting encouraged.  - Increased oxygen  to 3 liters as needed, especially when moving around. - Prescribed doxycycline  100 mg twice daily for 7 days. - Continue Breztri  inhaler twice daily. - Monitor symptoms and if not improved by Monday, will consider prescribing a steroid.  Orders:   doxycycline  (VIBRAMYCIN ) 100 MG capsule; Take 1 capsule (100 mg total) by mouth 2 (two) times daily. Take with food as can cause GI distress.

## 2024-05-15 NOTE — Assessment & Plan Note (Addendum)
 Diabetes management complicated by polyneuropathy and chronic kidney disease. Blood pressure is well-controlled at 128/82 mmHg. Neuropathy causing foot pain and difficulty with toenail care. Previous podiatrist visit suggested surgery for bunion, but he declined due to concerns about immobility post-surgery. Recommend f/u with podiatry for toenail management given risks of him attempting this himself and high risk of injury.  - Continue current diabetes management plan. - Encouraged podiatry follow-up for foot care and monitoring. Orders:   Ambulatory referral to Podiatry

## 2024-05-15 NOTE — Progress Notes (Signed)
 "  Paul FORBES Doing, DNP, AGNP-c [facility] [address] [place], [place] [address] [phone]   ACUTE VISIT : Acute or New Concern Visit on 05/15/2024  Blood pressure 126/82, pulse 75, weight 172 lb 9.6 oz (78.3 kg), SpO2 95%.   Subjective:  other (Acute visit, has brown phlegm, coughs a little bit, usually after a neb treatment, oxygen  helps a lot with his breathing, )   History of Present Illness Othal Kubitz is a 74 year old male with COPD and diabetes who presents with brown phlegm and breathing difficulties.  He has been experiencing coughing with brown phlegm, particularly after using his nebulizer treatment. He associates this with eating or drinking, noting that it is usually creamy white but sometimes appears brown. No current chills or fever, but he recalls a recent episode of chills that led to a hospital visit where he was diagnosed with pneumonia.  He is currently using oxygen  therapy, which he finds helpful for his breathing. He has increased his oxygen  to three liters at the recommendation of in home PT due to the length of his oxygen  hose, which affects the delivery of oxygen . He manages his mobility with a cane and has a system for moving around his home with the oxygen  hose. He does not wish to utilize the rollator at this time and finds it difficult to manage. He is waiting on delivery of a portable O2 machine to take with him for visits and for use in the home at times.   He has a history of COPD, diabetes, and cirrhosis. He is awaiting an appointment with a liver specialist. Certain medications, including Crestor , have been held due to liver concerns. Previously was told that alcohol  was the factor for cirrhosis, however, he has not significant alcohol  intake history reported. This has been very concerning for him.   He experiences neuropathy in his feet, making it difficult to cut his toenails. A podiatrist has suggested surgery for a bunion in the past,  but he does not want to pursue this. He would like to see a podiatrist for his toenails.   He is currently taking Breztri  inhaler twice daily and requires a refill of Ambien  for sleep. Ambien  helps him sleep, and without it, he struggles to fall asleep and stay asleep throughout the night.    ROS negative except for what is listed in HPI. History, Medications, Surgery, SDOH, and Family History reviewed and updated as appropriate.  Objective:  Physical Exam Vitals and nursing note reviewed.  Constitutional:      Appearance: Normal appearance.  HENT:     Head: Normocephalic.  Eyes:     Pupils: Pupils are equal, round, and reactive to light.  Cardiovascular:     Rate and Rhythm: Normal rate and regular rhythm.     Pulses: Normal pulses.     Heart sounds: Normal heart sounds.  Pulmonary:     Effort: Pulmonary effort is normal.     Breath sounds: Normal breath sounds.  Musculoskeletal:        General: Normal range of motion.     Cervical back: Normal range of motion.  Skin:    General: Skin is warm.  Neurological:     General: No focal deficit present.     Mental Status: He is alert and oriented to person, place, and time.  Psychiatric:        Mood and Affect: Mood normal.         Assessment & Plan:  Assessment & Plan OSA and COPD overlap syndrome (HCC) COPD with acute exacerbation (HCC) Acute exacerbation of COPD with brown phlegm production, possibly due to infection. Increased risk of pneumonia due to COPD, diabetes, and age. No fevers at this time. Recent hospitalization for pneumonia. Physical therapist recommended increasing oxygen  to 3 liters due to long hose length. Will trial doxycycline  for COPD exacerbation, but avoid oral steroids due to blood sugar at this time. We can consider this as an add on, if no improvement by Monday. Pulmonary toileting encouraged.  - Increased oxygen  to 3 liters as needed, especially when moving around. - Prescribed doxycycline  100 mg  twice daily for 7 days. - Continue Breztri  inhaler twice daily. - Monitor symptoms and if not improved by Monday, will consider prescribing a steroid.  Orders:   doxycycline  (VIBRAMYCIN ) 100 MG capsule; Take 1 capsule (100 mg total) by mouth 2 (two) times daily. Take with food as can cause GI distress.  Persistent insomnia Chronic insomnia managed with Ambien . Reports effective sleep with Ambien , no daytime sedation. Cautiously plan to continue this medication as this is long term and the risks of stopping at this time are great. May need to titrate down to 5mg  in the next year due to age and increased risks of sedation. Will monitor closely.  - Refilled Ambien  prescription through mail order pharmacy. Orders:   zolpidem  (AMBIEN ) 10 MG tablet; Take 1 tablet (10 mg total) by mouth at bedtime as needed.  Type 2 diabetes mellitus with stage 3a chronic kidney disease, with long-term current use of insulin  (HCC) Diabetes management complicated by polyneuropathy and chronic kidney disease. Blood pressure is well-controlled at 128/82 mmHg. Neuropathy causing foot pain and difficulty with toenail care. Previous podiatrist visit suggested surgery for bunion, but he declined due to concerns about immobility post-surgery. Recommend f/u with podiatry for toenail management given risks of him attempting this himself and high risk of injury.  - Continue current diabetes management plan. - Encouraged podiatry follow-up for foot care and monitoring. Orders:   Ambulatory referral to Podiatry  Cirrhosis of liver without ascites, unspecified hepatic cirrhosis type (HCC) Cirrhosis likely metabolic, based on patients history. Liver specialist appointment scheduled for February 15th. Crestor  held due to liver concerns. - Await liver specialist consultation on February 15th. - Hold Crestor  until liver specialist clearance.    Testosterone  deficiency in male Testosterone  deficiency with prior authorization  pending for testosterone  injections. Previous testosterone  cream was ineffective. He is no longer using the cream. We discussed possible benefits with this.  - Await prior authorization for testosterone  injections. - Hold testosterone  cream.      Paul FORBES Doing, DNP, AGNP-c       "

## 2024-05-15 NOTE — Patient Instructions (Addendum)
 I sent Doxycycline  for your lungs. Take one in the morning and one in the evening. If you feel like you still need more help by Monday call me and we will add the steroids.   You can increase the oxygen  on the big concentrator to 3L.

## 2024-05-16 ENCOUNTER — Other Ambulatory Visit (INDEPENDENT_AMBULATORY_CARE_PROVIDER_SITE_OTHER)

## 2024-05-16 DIAGNOSIS — E291 Testicular hypofunction: Secondary | ICD-10-CM | POA: Diagnosis not present

## 2024-05-16 MED ORDER — TESTOSTERONE CYPIONATE 200 MG/ML IM SOLN
100.0000 mg | Freq: Once | INTRAMUSCULAR | Status: AC
  Administered 2024-05-16: 100 mg via INTRAMUSCULAR

## 2024-05-16 NOTE — Assessment & Plan Note (Signed)
 Cirrhosis likely metabolic, based on patients history. Liver specialist appointment scheduled for February 15th. Crestor  held due to liver concerns. - Await liver specialist consultation on February 15th. - Hold Crestor  until liver specialist clearance.

## 2024-05-16 NOTE — Assessment & Plan Note (Signed)
 Testosterone  deficiency with prior authorization pending for testosterone  injections. Previous testosterone  cream was ineffective. He is no longer using the cream. We discussed possible benefits with this.  - Await prior authorization for testosterone  injections. - Hold testosterone  cream.

## 2024-05-16 NOTE — Progress Notes (Signed)
 Patient is in office today for a nurse visit for Testosterone  Injection. Patient Injection was given in the  Right upper quad. abdomen. Patient tolerated injection well.

## 2024-05-21 ENCOUNTER — Telehealth: Payer: Self-pay

## 2024-05-21 NOTE — Telephone Encounter (Signed)
 Called home PT back with [facility] home health and let him know why he was on doxycycline  for his lungs. I am going to also refax his oxygen  order to [employer] as requested by the pt. He does not want to use [employer].   Copied from CRM [id number]. Topic: General - Other >> May 21, 2024 11:05 AM Wess RAMAN wrote: Reason for CRM: Rosan Axon, physical therapist, from [facility] stated:  Patient has large tank of oxygen  and is requesting to switch to a smaller one so that he is able to get out of the house. He is also working on getting a pulse oximeter.  Last week he was placed on an antibiotic and would like to know why.  Oxgen vendor he is using now is [employer]. He no longer uses [employer].  Physical Therapist Callback #: [phone] (secure vm)

## 2024-05-22 ENCOUNTER — Ambulatory Visit

## 2024-05-22 VITALS — BP 156/75 | HR 77 | Temp 98.0°F | Resp 20 | Ht 65.0 in | Wt 175.6 lb

## 2024-05-22 DIAGNOSIS — D509 Iron deficiency anemia, unspecified: Secondary | ICD-10-CM | POA: Diagnosis not present

## 2024-05-22 MED ORDER — SODIUM CHLORIDE 0.9 % IV SOLN
510.0000 mg | Freq: Once | INTRAVENOUS | Status: AC
  Administered 2024-05-22: 510 mg via INTRAVENOUS
  Filled 2024-05-22: qty 17

## 2024-05-22 NOTE — Progress Notes (Signed)
 Diagnosis: , Acute Anemia  Provider:  Mannam, Praveen MD  Procedure: IV Infusion  IV Type: Peripheral, IV Location: R Forearm  , Feraheme  (Ferumoxytol ), Dose: 510 mg  Infusion Start Time: 1136  Infusion Stop Time: 1155  Post Infusion IV Care: Observation period completed and Peripheral IV Discontinued  Discharge: Condition: Good, Destination: Home . AVS Provided  Performed by:  Donny Childes, RN

## 2024-05-22 NOTE — Patient Instructions (Signed)
 Ferumoxytol  Injection What is this medication? FERUMOXYTOL  (FER ue MOX i tol) treats low levels of iron  in your body (iron  deficiency anemia). Iron  is a mineral that plays an important role in making red blood cells, which carry oxygen from your lungs to the rest of your body. This medicine may be used for other purposes; ask your health care provider or pharmacist if you have questions. COMMON BRAND NAME(S): Feraheme  What should I tell my care team before I take this medication? They need to know if you have any of these conditions: Anemia not caused by low iron  levels High levels of iron  in the blood Magnetic resonance imaging (MRI) test scheduled An unusual or allergic reaction to iron , other medications, foods, dyes, or preservatives Pregnant or trying to get pregnant Breastfeeding How should I use this medication? This medication is injected into a vein. It is given by your care team in a hospital or clinic setting. Talk to your care team the use of this medication in children. Special care may be needed. Overdosage: If you think you have taken too much of this medicine contact a poison control center or emergency room at once. NOTE: This medicine is only for you. Do not share this medicine with others. What if I miss a dose? It is important not to miss your dose. Call your care team if you are unable to keep an appointment. What may interact with this medication? Other iron  products This list may not describe all possible interactions. Give your health care provider a list of all the medicines, herbs, non-prescription drugs, or dietary supplements you use. Also tell them if you smoke, drink alcohol , or use illegal drugs. Some items may interact with your medicine. What should I watch for while using this medication? Your condition will be monitored carefully while you are receiving this medication. Tell your care team if your symptoms do not start to get better or if they get  worse. You may need blood work done while you are taking this medication. Sometimes, when medications are infused into veins, a little can leak out of the vein and into the tissue around it. If this medication leaks, it can cause a brown or dark stain on the skin. This is not common. It may be permanent. If you feel pain or swelling during your infusion, tell your care team right away. They can stop the infusion and treat the area. You may need to eat more foods that contain iron . Talk to your care team. Foods that contain iron  include whole grains or cereals, dried fruits, beans, peas, leafy green vegetables, and organ meats (liver, kidney). What side effects may I notice from receiving this medication? Side effects that you should report to your care team as soon as possible: Allergic reactions--skin rash, itching, hives, swelling of the face, lips, tongue, or throat Low blood pressure--dizziness, feeling faint or lightheaded, blurry vision Painful swelling, warmth, or redness of the skin, brown or dark skin color at the infusion site Shortness of breath Side effects that usually do not require medical attention (report these to your care team if they continue or are bothersome): Flushing Headache Joint pain Muscle pain Nausea This list may not describe all possible side effects. Call your doctor for medical advice about side effects. You may report side effects to FDA at 1-800-FDA-1088. Where should I keep my medication? This medication is given in a hospital or clinic. It will not be stored at home. NOTE: This sheet is a summary.  It may not cover all possible information. If you have questions about this medicine, talk to your doctor, pharmacist, or health care provider.  2025 Elsevier/Gold Standard (2024-02-20 00:00:00)

## 2024-05-29 ENCOUNTER — Ambulatory Visit: Admitting: Dermatology

## 2024-05-30 ENCOUNTER — Other Ambulatory Visit

## 2024-06-12 ENCOUNTER — Other Ambulatory Visit

## 2024-08-12 ENCOUNTER — Ambulatory Visit: Admitting: Nurse Practitioner
